# Patient Record
Sex: Male | Born: 1950
Health system: Southern US, Community
[De-identification: ages and names within clinical notes are randomized; demographics above are authoritative.]

## PROBLEM LIST (undated history)

## (undated) DIAGNOSIS — Z86010 Personal history of colonic polyps: Principal | ICD-10-CM

## (undated) DIAGNOSIS — E785 Hyperlipidemia, unspecified: Secondary | ICD-10-CM

## (undated) DIAGNOSIS — E11319 Type 2 diabetes mellitus with unspecified diabetic retinopathy without macular edema: Secondary | ICD-10-CM

## (undated) DIAGNOSIS — R569 Unspecified convulsions: Secondary | ICD-10-CM

## (undated) DIAGNOSIS — M1990 Unspecified osteoarthritis, unspecified site: Secondary | ICD-10-CM

## (undated) DIAGNOSIS — K219 Gastro-esophageal reflux disease without esophagitis: Secondary | ICD-10-CM

## (undated) DIAGNOSIS — J189 Pneumonia, unspecified organism: Secondary | ICD-10-CM

## (undated) DIAGNOSIS — F32A Depression, unspecified: Secondary | ICD-10-CM

## (undated) DIAGNOSIS — I213 ST elevation (STEMI) myocardial infarction of unspecified site: Secondary | ICD-10-CM

## (undated) DIAGNOSIS — I10 Essential (primary) hypertension: Secondary | ICD-10-CM

## (undated) DIAGNOSIS — I739 Peripheral vascular disease, unspecified: Secondary | ICD-10-CM

## (undated) DIAGNOSIS — I5022 Chronic systolic (congestive) heart failure: Secondary | ICD-10-CM

## (undated) DIAGNOSIS — H35039 Hypertensive retinopathy, unspecified eye: Secondary | ICD-10-CM

## (undated) DIAGNOSIS — N179 Acute kidney failure, unspecified: Secondary | ICD-10-CM

## (undated) DIAGNOSIS — I480 Paroxysmal atrial fibrillation: Secondary | ICD-10-CM

## (undated) DIAGNOSIS — E119 Type 2 diabetes mellitus without complications: Secondary | ICD-10-CM

## (undated) HISTORY — DX: Hyperlipidemia, unspecified: E78.5

## (undated) HISTORY — PX: CATARACT EXTRACTION: SUR2

## (undated) HISTORY — DX: Essential (primary) hypertension: I10

## (undated) HISTORY — DX: Type 2 diabetes mellitus without complications: E11.9

## (undated) HISTORY — DX: Type 2 diabetes mellitus with unspecified diabetic retinopathy without macular edema: E11.319

## (undated) HISTORY — PX: EYE SURGERY: SHX253

## (undated) HISTORY — PX: CATARACT EXTRACTION, BILATERAL: SHX1313

## (undated) HISTORY — DX: Personal history of colonic polyps: Z86.010

## (undated) HISTORY — DX: Hypertensive retinopathy, unspecified eye: H35.039

## (undated) HISTORY — PX: CERVICAL FUSION: SHX112

---

## 2005-07-15 ENCOUNTER — Inpatient Hospital Stay (HOSPITAL_COMMUNITY): Admission: EM | Admit: 2005-07-15 | Discharge: 2005-07-17 | Payer: Self-pay | Admitting: Emergency Medicine

## 2005-07-16 ENCOUNTER — Encounter (INDEPENDENT_AMBULATORY_CARE_PROVIDER_SITE_OTHER): Payer: Self-pay | Admitting: *Deleted

## 2006-09-06 ENCOUNTER — Encounter: Admission: RE | Admit: 2006-09-06 | Discharge: 2006-09-06 | Payer: Self-pay | Admitting: Orthopedic Surgery

## 2006-09-25 ENCOUNTER — Encounter: Admission: RE | Admit: 2006-09-25 | Discharge: 2006-09-25 | Payer: Self-pay | Admitting: Orthopedic Surgery

## 2006-10-09 ENCOUNTER — Encounter: Admission: RE | Admit: 2006-10-09 | Discharge: 2006-10-09 | Payer: Self-pay | Admitting: Orthopedic Surgery

## 2008-09-23 ENCOUNTER — Ambulatory Visit: Payer: Self-pay | Admitting: Family Medicine

## 2008-09-23 DIAGNOSIS — Z794 Long term (current) use of insulin: Secondary | ICD-10-CM

## 2008-09-23 DIAGNOSIS — E1152 Type 2 diabetes mellitus with diabetic peripheral angiopathy with gangrene: Secondary | ICD-10-CM | POA: Insufficient documentation

## 2008-09-23 DIAGNOSIS — F329 Major depressive disorder, single episode, unspecified: Secondary | ICD-10-CM | POA: Insufficient documentation

## 2008-11-01 ENCOUNTER — Ambulatory Visit: Payer: Self-pay | Admitting: Family Medicine

## 2008-11-01 ENCOUNTER — Encounter: Payer: Self-pay | Admitting: Family Medicine

## 2008-11-01 DIAGNOSIS — M542 Cervicalgia: Secondary | ICD-10-CM | POA: Insufficient documentation

## 2008-11-01 DIAGNOSIS — K219 Gastro-esophageal reflux disease without esophagitis: Secondary | ICD-10-CM | POA: Insufficient documentation

## 2008-11-01 DIAGNOSIS — E785 Hyperlipidemia, unspecified: Secondary | ICD-10-CM | POA: Insufficient documentation

## 2008-11-01 DIAGNOSIS — M25519 Pain in unspecified shoulder: Secondary | ICD-10-CM | POA: Insufficient documentation

## 2008-11-01 LAB — CONVERTED CEMR LAB
ALT: 22 units/L (ref 0–53)
AST: 18 units/L (ref 0–37)
Albumin: 4.1 g/dL (ref 3.5–5.2)
CO2: 22 meq/L (ref 19–32)
Chloride: 101 meq/L (ref 96–112)
Cholesterol, target level: 200 mg/dL
Creatinine, Ser: 0.83 mg/dL (ref 0.40–1.50)
HDL goal, serum: 40 mg/dL
Hgb A1c MFr Bld: 12.5 %
LDL Goal: 100 mg/dL

## 2008-11-02 ENCOUNTER — Ambulatory Visit: Payer: Self-pay | Admitting: Family Medicine

## 2008-11-02 ENCOUNTER — Encounter: Payer: Self-pay | Admitting: Family Medicine

## 2008-11-02 LAB — CONVERTED CEMR LAB
Cholesterol: 178 mg/dL (ref 0–200)
HDL: 27 mg/dL — ABNORMAL LOW (ref >39)
LDL Cholesterol: 113 mg/dL — ABNORMAL HIGH (ref 0–99)
Triglycerides: 188 mg/dL — ABNORMAL HIGH (ref <150)

## 2008-11-03 ENCOUNTER — Encounter: Payer: Self-pay | Admitting: Family Medicine

## 2008-12-21 ENCOUNTER — Ambulatory Visit: Payer: Self-pay | Admitting: Family Medicine

## 2008-12-28 ENCOUNTER — Ambulatory Visit: Payer: Self-pay | Admitting: Family Medicine

## 2008-12-28 DIAGNOSIS — F17200 Nicotine dependence, unspecified, uncomplicated: Secondary | ICD-10-CM | POA: Insufficient documentation

## 2009-02-11 ENCOUNTER — Ambulatory Visit: Payer: Self-pay | Admitting: Family Medicine

## 2009-02-11 LAB — CONVERTED CEMR LAB: Hgb A1c MFr Bld: 7 %

## 2009-03-04 ENCOUNTER — Encounter: Payer: Self-pay | Admitting: Family Medicine

## 2009-03-21 ENCOUNTER — Encounter: Payer: Self-pay | Admitting: Family Medicine

## 2010-01-03 ENCOUNTER — Encounter: Payer: Self-pay | Admitting: Family Medicine

## 2010-01-03 ENCOUNTER — Ambulatory Visit: Payer: Self-pay | Admitting: Family Medicine

## 2010-01-03 DIAGNOSIS — B351 Tinea unguium: Secondary | ICD-10-CM | POA: Insufficient documentation

## 2010-01-03 LAB — CONVERTED CEMR LAB: Hgb A1c MFr Bld: 8.3 %

## 2010-01-04 LAB — CONVERTED CEMR LAB
ALT: 18 units/L (ref 0–53)
Albumin: 4.4 g/dL (ref 3.5–5.2)
Calcium: 10 mg/dL (ref 8.4–10.5)
Creatinine, Ser: 0.83 mg/dL (ref 0.40–1.50)
Hemoglobin: 15.7 g/dL (ref 13.0–17.0)
MCHC: 34.9 g/dL (ref 30.0–36.0)
Platelets: 184 10*3/uL (ref 150–400)
Potassium: 4.2 meq/L (ref 3.5–5.3)
RBC: 5.16 M/uL (ref 4.22–5.81)
Total Bilirubin: 0.6 mg/dL (ref 0.3–1.2)
Total Protein: 6.5 g/dL (ref 6.0–8.3)

## 2010-02-14 ENCOUNTER — Telehealth: Payer: Self-pay | Admitting: *Deleted

## 2010-03-09 ENCOUNTER — Encounter: Payer: Self-pay | Admitting: Family Medicine

## 2010-03-13 ENCOUNTER — Encounter: Payer: Self-pay | Admitting: Family Medicine

## 2010-03-17 ENCOUNTER — Ambulatory Visit: Payer: Self-pay | Admitting: Family Medicine

## 2010-03-17 DIAGNOSIS — I10 Essential (primary) hypertension: Secondary | ICD-10-CM

## 2010-03-24 ENCOUNTER — Telehealth: Payer: Self-pay | Admitting: Family Medicine

## 2010-04-04 ENCOUNTER — Encounter: Payer: Self-pay | Admitting: Family Medicine

## 2010-04-04 ENCOUNTER — Ambulatory Visit: Payer: Self-pay | Admitting: Family Medicine

## 2010-04-04 LAB — CONVERTED CEMR LAB
Amphetamine Screen, Ur: NEGATIVE
Benzodiazepines.: NEGATIVE
Marijuana Metabolite: NEGATIVE
Phencyclidine (PCP): NEGATIVE

## 2010-04-21 ENCOUNTER — Encounter: Payer: Self-pay | Admitting: Family Medicine

## 2010-04-21 ENCOUNTER — Ambulatory Visit: Payer: Self-pay | Admitting: Family Medicine

## 2010-04-21 LAB — CONVERTED CEMR LAB
ALT: 59 units/L — ABNORMAL HIGH (ref 0–53)
AST: 28 units/L (ref 0–37)
Alkaline Phosphatase: 115 units/L (ref 39–117)
Bilirubin, Direct: 0.1 mg/dL (ref 0.0–0.3)
Hgb A1c MFr Bld: 11 %
Indirect Bilirubin: 0.6 mg/dL (ref 0.0–0.9)
Total Protein: 6.7 g/dL (ref 6.0–8.3)

## 2010-04-22 ENCOUNTER — Encounter: Payer: Self-pay | Admitting: Family Medicine

## 2010-08-01 NOTE — Assessment & Plan Note (Signed)
Summary: HTN, left shoulder pain, meds refilled   Vital Signs:  Patient profile:   60 year old male Height:      65 inches Weight:      180 pounds BMI:     30.06 BSA:     1.89 Temp:     97.6 degrees F Pulse rate:   64 / minute BP sitting:   163 / 83  Vitals Entered By: Jone Baseman CMA (March 17, 2010 11:16 AM) CC: HTN, shoulder pain, med refills Is Patient Diabetic? Yes Pain Assessment Patient in pain? no        Primary Care Provider:  Jamie Brookes MD  CC:  HTN, shoulder pain, and med refills.  History of Present Illness: hypertension: Pt comes in today with hypertension for the second time in a row. Discussed how pain can make his BP go up but he is in chronic pain so his BP is always up. No chest pain, no SOB.   Shoulder pain: PT is seen by an orthopedic group. He has been getting joint injections in his left shoulder. The last one did not help and he wants to know if he can get a sling to put him arm in because he always holds it in front of his body as if it's in a sling anyways. He is Rt handed.   Med refills: Discussed meds and refilled as necessary. Pt has limited income and is often not taking all prescribed pills because he can't afford them.   Habits & Providers  Alcohol-Tobacco-Diet     Alcohol drinks/day: 0     Tobacco Status: current     Tobacco Counseling: to quit use of tobacco products     Cigarette Packs/Day: <0.25     Year Started: 1969  Current Medications (verified): 1)  Lancets and Test Strips .... Test Blood Sugar Twice Daily 2)  Metformin Hcl 1000 Mg Tabs (Metformin Hcl) .... Take One Twice Daily 3)  Glimepiride 2 Mg Tabs (Glimepiride) .... One Tablet Daily Prior To Dinner 4)  Nexium 20 Mg Cpdr (Esomeprazole Magnesium) .... One Daily Each Moring 5)  Ibuprofen 200 Mg Tabs (Ibuprofen) .[name].. 1-2 Pills Every 6 Hours As Needed For Pain 6)  Simvastatin 40 Mg Tabs (Simvastatin) .... Take 1 Pill By Mouth Every Bedtime. 7)  Terbinafine Hcl  250 Mg Tabs (Terbinafine Hcl) .... Take 1 Pill Daily For 12 Weeks 8)  Bupropion Hcl 100 Mg Tabs (Bupropion Hcl) .... Start Taing 1 Tablet Morning and Evening. After 3 Days Increase To 3 Times A Day. 9)  Hydrochlorothiazide 12.5 Mg Caps (Hydrochlorothiazide) .... Take 1 Pill Every Morning.  Allergies (verified): No Known Drug Allergies  Review of Systems        vitals reviewed and pertinent negatives and positives seen in HPI   Physical Exam  General:  Well-developed,well-nourished,in no acute distress; alert,appropriate and cooperative throughout examination Lungs:  Normal respiratory effort, chest expands symmetrically. Lungs are clear to auscultation, no crackles or wheezes. Heart:  Normal rate and regular rhythm. S1 and S2 normal without gallop, murmur, click, rub or other extra sounds. Psych:  pt appears depressed with dysphoric affect   Impression & Recommendations:  Problem # 1:  ESSENTIAL HYPERTENSION (ICD-401.9) Assessment New Pt has had elevated BP at last visit but it was thought to be because of his pain. This is the second appt in a row with his BP elevated so we will start meds now. recheck BP in 1 week. see instruction.   His  updated medication list for this problem includes:    Hydrochlorothiazide 12.5 Mg Caps (Hydrochlorothiazide) .[name]... Take 1 pill every morning.  Orders: [facility]- Est Level  3 (04540)  Problem # 2:  SHOULDER PAIN, LEFT, CHRONIC (ICD-719.41) Assessment: Unchanged ADvised pt to seek help from his Orthopedic MD. I don't think a sling is best at this time since he will likely lose even more function if not using it.  His updated medication list for this problem includes:    Ibuprofen 200 Mg Tabs (Ibuprofen) .[name]... 1-2 pills every 6 hours as needed for pain  Orders: [facility]- Est Level  3 (98119)  Complete Medication List: 1)  Lancets and Test Strips  .... Test blood sugar twice daily 2)  Metformin Hcl 1000 Mg Tabs (Metformin hcl) .... Take one twice  daily 3)  Glimepiride 2 Mg Tabs (Glimepiride) .... One tablet daily prior to dinner 4)  Nexium 20 Mg Cpdr (Esomeprazole magnesium) .... One daily each moring 5)  Ibuprofen 200 Mg Tabs (Ibuprofen) .[name].. 1-2 pills every 6 hours as needed for pain 6)  Simvastatin 40 Mg Tabs (Simvastatin) .... Take 1 pill by mouth every bedtime. 7)  Terbinafine Hcl 250 Mg Tabs (Terbinafine hcl) .... Take 1 pill daily for 12 weeks 8)  Bupropion Hcl 100 Mg Tabs (Bupropion hcl) .... Start taing 1 tablet morning and evening. after 3 days increase to 3 times a day. 9)  Hydrochlorothiazide 12.5 Mg Caps (Hydrochlorothiazide) .... Take 1 pill every morning.  Patient Instructions: 1)  Come back in 1 week after starting blood pressure meds to have your BP checked.  2)  Talk to your orthopedic surgeon and find out what else can be done for the shoulder. I don't think a sling is a good idea since you may lose complete function if we do that.  Prescriptions: HYDROCHLOROTHIAZIDE 12.5 MG CAPS (HYDROCHLOROTHIAZIDE) take 1 pill every morning.  #31 x 3   Entered and Authorized by:   Jamie Brookes MD   Signed by:   Jamie Brookes MD on 03/17/2010   Method used:   Electronically to        [facility] [phone]* (retail)       [address]       [place], [place]  [address]       Ph: [phone]       Fax: [phone]   RxID:   [id number] BUPROPION HCL 100 MG TABS (BUPROPION HCL) start taing 1 tablet morning and evening. after 3 days increase to 3 times a day.  #36 x 0   Entered and Authorized by:   Jamie Brookes MD   Signed by:   Jamie Brookes MD on 03/17/2010   Method used:   Electronically to        [facility] [phone]* (retail)       [address]       [place], [place]  [address]       Ph: [phone]       Fax: [phone]   RxID:   [id number]

## 2010-08-01 NOTE — Consult Note (Signed)
Summary: [facility]   Imported By: Clydell Hakim 08/16/2009 16:50:33  _____________________________________________________________________  External Attachment:    Type:   Image     Comment:   External Document

## 2010-08-01 NOTE — Letter (Signed)
Summary: Podiatry  Podiatry   Imported By: Clydell Hakim 08/16/2009 16:08:57  _____________________________________________________________________  External Attachment:    Type:   Image     Comment:   External Document

## 2010-08-01 NOTE — Consult Note (Signed)
Summary: [facility]: Dr[facility]   Imported By: Clydell Hakim 03/22/2010 11:34:40  _____________________________________________________________________  External Attachment:    Type:   Image     Comment:   External Document

## 2010-08-01 NOTE — Assessment & Plan Note (Signed)
Summary: Toe fungus, Depression, DM2, GERD, Neck pain   Vital Signs:  Patient profile:   60 year old male Height:      65 inches Weight:      176.9 pounds BMI:     29.54 Temp:     98.4 degrees F oral Pulse rate:   80 / minute BP sitting:   153 / 76  (left arm) Cuff size:   regular  Vitals Entered By: Gladstone Pih (January 03, 2010 2:47 PM) CC: F/U Is Patient Diabetic? Yes Did you bring your meter with you today? No Pain Assessment Patient in pain? no        Primary Care Provider:  Jamie Brookes MD  CC:  F/U.  History of Present Illness: Toe fungus: Pt comes in c/o toe fungus. he says that last time he saw his podiatrist (back in march?) it was suggested that he get meds to treat his toes fungus. Pt would like to get it treated now.   Depression: Pt has been taking half a pill of his wifes Zoloft lately. He says he thinks he is a little better and his wife says that he is much better (not yelling at the grandkids as much).   DM2: Pt has not been checking his sugars. The last time he checked them was in March and it was 277. He does not have test strips or lancets. He has been letting his wife get all her meds and because they don't have much money he doesn't get his own.   GERD: Pt is still having reflux especially at night. He says that he feels a burning in his stomach and has the acid come back up at night when he is laying flat. He has to lay flat because of his neck surgeries. Discussed raising the bed.   Neck pain: Pt lives in constant neck pain but says that he has only been taking Motrin for the pain. It takes the edge off but he always has pain. Pt says the Tramadol did not help.   Habits & Providers  Alcohol-Tobacco-Diet     Tobacco Status: current     Tobacco Counseling: to quit use of tobacco products     Cigarette Packs/Day: <0.25  Current Medications (verified): 1)  Lancets and Test Strips .... Test Blood Sugar Twice Daily 2)  Metformin Hcl 1000 Mg  Tabs (Metformin Hcl) .... Take One Twice Daily 3)  Glimepiride 2 Mg Tabs (Glimepiride) .... One Tablet Daily Prior To Dinner 4)  Nexium 20 Mg Cpdr (Esomeprazole Magnesium) .... One Daily Each Moring 5)  Ibuprofen 200 Mg Tabs (Ibuprofen) .[name].. 1-2 Pills Every 6 Hours As Needed For Pain 6)  Simvastatin 40 Mg Tabs (Simvastatin) .... Take 1 Pill By Mouth Every Bedtime. 7)  Sertraline Hcl 50 Mg Tabs (Sertraline Hcl) .... Take 1 Pill Daily For Feeling Better 8)  Terbinafine Hcl 250 Mg Tabs (Terbinafine Hcl) .... Take 1 Pill Daily For 12 Weeks  Allergies (verified): No Known Drug Allergies  Social History: Packs/Day:  <0.25  Review of Systems        vitals reviewed and pertinent negatives and positives seen in HPI   Physical Exam  General:  Well-developed,well-nourished,in no acute distress; alert,appropriate and cooperative throughout examination Lungs:  Normal respiratory effort, chest expands symmetrically. Lungs are clear to auscultation, no crackles or wheezes. Heart:  Normal rate and regular rhythm. S1 and S2 normal without gallop, murmur, click, rub or other extra sounds. Msk:  left shoulder has  more ROM than at last visit.   Diabetes Management Exam:    Foot Exam (with socks and/or shoes not present):       Sensory-Monofilament:          Left foot: absent          Right foot: diminished       Inspection:          Left foot: abnormal             Comments: thick calluses on foot          Right foot: abnormal             Comments: thick calluses on foot       Nails:          Left foot: fungal infection          Right foot: fungal infection    Foot Exam by Podiatrist:       Date: 09/06/2009   Impression & Recommendations:  Problem # 1:  ONYCHOMYCOSIS, TOENAILS (ICD-110.1) Assessment New Pt has developed a very bad fungal infection. Pt wants to be treated. Labs today and repeat liver fxn labs in 6 weeks. Pt aware that he has to be tested to continue this medicine.   His  updated medication list for this problem includes:    Terbinafine Hcl 250 Mg Tabs (Terbinafine hcl) .[name]... Take 1 pill daily for 12 weeks  Orders: Comp Met-[facility] ([address]) CBC-[facility] (09811) [facility]- Est  Level 4 (91478)  Problem # 2:  DEPRESSION (ICD-311) Assessment: Deteriorated Pt has been taking his wifes meds and is feeling a little bit better on them. He has a PHQ9 score of 15. Plan to start him on the dose he has been taking at home. Will plan to folllow with serial PHQ9's.   His updated medication list for this problem includes:    Sertraline Hcl 50 Mg Tabs (Sertraline hcl) .[name]... Take 1 pill daily for feeling better  Orders: [facility]- Est  Level 4 ([address])  Problem # 3:  DIABETES MELLITUS, TYPE II (ICD-250.00) Assessment: Deteriorated A1c has gotten worse since the last time he was tested. he has not been taking his meds in about 1 year. He does not have the money for his meds or testing per patient. Pt has had referral to podeitry in the past and the last time he saw them was in March. Encouraged him to go back because he has large calluses that need to be treated.   His updated medication list for this problem includes:    Metformin Hcl 1000 Mg Tabs (Metformin hcl) .[name]... Take one twice daily    Glimepiride 2 Mg Tabs (Glimepiride) ..... One tablet daily prior to dinner  Orders: A1C-[facility] (13086) [facility]- Est  Level 4 (57846)  Problem # 4:  GERD (ICD-530.81) Assessment: Deteriorated Pt has not been taking his nexium. Refilled today. Suggested raising the head of the bed with phone books to decrease nighttime reflux.   His updated medication list for this problem includes:    Nexium 20 Mg Cpdr (Esomeprazole magnesium) ..... One daily each moring  Orders: [facility]- Est  Level 4 ([address]214)  Problem # 5:  SHOULDER PAIN, LEFT, CHRONIC (ICD-719.41) Assessment: Unchanged Pt is in constant pain but is not asking for any new pain meds. He has not been taking anything but Motrin and says this takes the  edge off. He has a h/o multiple neck surgeries and will likely have chronic pain the rest of his  life.   The following medications were removed from the medication list:    Tramadol Hcl 50 Mg Tabs (Tramadol hcl) .[name]... 1-2 pills every six hours as needed pain His updated medication list for this problem includes:    Ibuprofen 200 Mg Tabs (Ibuprofen) .[name]... 1-2 pills every 6 hours as needed for pain  Complete Medication List: 1)  Lancets and Test Strips  .... Test blood sugar twice daily 2)  Metformin Hcl 1000 Mg Tabs (Metformin hcl) .... Take one twice daily 3)  Glimepiride 2 Mg Tabs (Glimepiride) .... One tablet daily prior to dinner 4)  Nexium 20 Mg Cpdr (Esomeprazole magnesium) .... One daily each moring 5)  Ibuprofen 200 Mg Tabs (Ibuprofen) .[name].. 1-2 pills every 6 hours as needed for pain 6)  Simvastatin 40 Mg Tabs (Simvastatin) .... Take 1 pill by mouth every bedtime. 7)  Sertraline Hcl 50 Mg Tabs (Sertraline hcl) .... Take 1 pill daily for feeling better 8)  Terbinafine Hcl 250 Mg Tabs (Terbinafine hcl) .... Take 1 pill daily for 12 weeks  Patient Instructions: 1)  It was great to see you today.  2)  Try to get your meds filled as soon as possible. 3)  You are getting blood drawn today.  4)  If there is anything concerning I will call you.  5)  I will see you for retesting of lab work in 6 weeks.  Prescriptions: TERBINAFINE HCL 250 MG TABS (TERBINAFINE HCL) take 1 pill daily for 12 weeks  #120 x 0   Entered and Authorized by:   Jamie Brookes MD   Signed by:   Jamie Brookes MD on 01/03/2010   Method used:   Faxed to ...       [facility] (retail)             [place], [place]         Ph:        Fax: [phone]   RxID:   [id number] SERTRALINE HCL 50 MG TABS (SERTRALINE HCL) take 1 pill daily for feeling better  #31 x 11   Entered and Authorized by:   Jamie Brookes MD   Signed by:   Jamie Brookes MD on 01/03/2010   Method used:   Faxed to ...        [facility] (retail)             [place], [place]         Ph:        Fax: [phone]   RxID:   [id number] SIMVASTATIN 40 MG TABS (SIMVASTATIN) take 1 pill by mouth every bedtime.  #31 x 11   Entered and Authorized by:   Jamie Brookes MD   Signed by:   Jamie Brookes MD on 01/03/2010   Method used:   Faxed to ...       [facility] (retail)             [place], [place]         Ph:        Fax: [phone]   RxID:   [id number] NEXIUM 20 MG CPDR (ESOMEPRAZOLE MAGNESIUM) one daily each moring  #31 x 11   Entered and Authorized by:   Jamie Brookes MD   Signed by:   Jamie Brookes MD on 01/03/2010   Method used:   Faxed to ...       [facility] (retail)  [place], [place]         Ph:        Fax: [phone]   RxID:   [id number] GLIMEPIRIDE 2 MG TABS (GLIMEPIRIDE) one tablet daily prior to dinner  #31 x 11   Entered and Authorized by:   Jamie Brookes MD   Signed by:   Jamie Brookes MD on 01/03/2010   Method used:   Faxed to ...       [facility] (retail)             [place], [place]         Ph:        Fax: [phone]   RxID:   [id number] METFORMIN HCL 1000 MG TABS (METFORMIN HCL) Take one twice daily  #31 x 0   Entered and Authorized by:   Jamie Brookes MD   Signed by:   Jamie Brookes MD on 01/03/2010   Method used:   Faxed to ...       [facility] (retail)             [place], [place]         Ph:        Fax: [phone]   RxID:   [id number]   Laboratory Results   Blood Tests   Date/Time Received: January 03, 2010 2:52 PM  Date/Time Reported: January 03, 2010 3:31 PM   HGBA1C: 8.3%   (Normal Range: Non-Diabetic - 3-6%   Control Diabetic - 6-8%)  Comments: ...........test performed by...........Marland KitchenTerese Door, CMA

## 2010-08-01 NOTE — Assessment & Plan Note (Signed)
Summary: onychomycosis, DM2 worsened   Vital Signs:  Patient profile:   60 year old male Weight:      178.4 pounds Temp:     98.1 degrees F oral Pulse rate:   90 / minute Pulse rhythm:   regular BP sitting:   134 / 80  (left arm) Cuff size:   regular  Vitals Entered By: Loralee Pacas CMA (April 21, 2010 2:10 PM) CC: toenail fungus, DM2 Is Patient Diabetic? Yes Did you bring your meter with you today? No Comments toe fungus not any better   Primary Care Provider:  Jamie Brookes MD  CC:  toenail fungus and DM2.  History of Present Illness: Toenail FUngus: Pt has had a toenail fungus which we are treating with a long dose of terbinafine. He is starting to see come improvement in the new growth of his toes and he's happy about this We had discussed checking his liver function after he'd been on the meds for a while so he comes in today to get tested.   DM2:  Pt has not been getting all his meds refilled. He and his wife are very poor and they have to constantly make decisions about what meds to buy and which ones not to buy. He also doesn't test his CBG's often because he shares this with his wife as well and usually just lets her check her own. He is not going to an eye doctor as suggested. He is checking his feet.   Habits & Providers  Alcohol-Tobacco-Diet     Alcohol drinks/day: 0     Tobacco Status: current     Tobacco Counseling: to quit use of tobacco products     Cigarette Packs/Day: <0.25     Year Started: 1969  Current Medications (verified): 1)  Lancets and Test Strips .... Test Blood Sugar Twice Daily 2)  Metformin Hcl 1000 Mg Tabs (Metformin Hcl) .... Take One Twice Daily 3)  Glimepiride 2 Mg Tabs (Glimepiride) .... One Tablet Daily Prior To Dinner 4)  Nexium 20 Mg Cpdr (Esomeprazole Magnesium) .... One Daily Each Moring 5)  Ibuprofen 200 Mg Tabs (Ibuprofen) .[name].. 1-2 Pills Every 6 Hours As Needed For Pain 6)  Simvastatin 40 Mg Tabs (Simvastatin) .... Take 1  Pill By Mouth Every Bedtime. 7)  Terbinafine Hcl 250 Mg Tabs (Terbinafine Hcl) .... Take 1 Pill Daily For 12 Weeks 8)  Bupropion Hcl 100 Mg Tabs (Bupropion Hcl) .... Start Taing 1 Tablet Morning and Evening. After 3 Days Increase To 3 Times A Day. 9)  Hydrochlorothiazide 12.5 Mg Caps (Hydrochlorothiazide) .... Take 1 Pill Every Morning.  Allergies: No Known Drug Allergies    Impression & Recommendations:  Problem # 1:  ONYCHOMYCOSIS, TOENAILS (ICD-110.1) Assessment Improved Toenails are growing out with new healthy toenail just starting to show. Plan to get a hepatic fxn panel on him because of the terbinafine.   His updated medication list for this problem includes:    Terbinafine Hcl 250 Mg Tabs (Terbinafine hcl) .[name]... Take 1 pill daily for 12 weeks  Orders: Hepatic-[facility] ([address])  Problem # 2:  DIABETES MELLITUS, TYPE II (ICD-250.00) Pt's A1c is 11 today. This is worse than normal. He can not afford his meds so he does not buy them. THey can't afford good food so they end up eating hamberger helper most nights. I will discuss with our clinical pharmacist and see if there is anything we can do to get his meds for free.   His updated medication  list for this problem includes:    Metformin Hcl 1000 Mg Tabs (Metformin hcl) .[name]... Take one twice daily    Glimepiride 2 Mg Tabs (Glimepiride) ..... One tablet daily prior to dinner  Orders: A1C-[facility] (84132)  Complete Medication List: 1)  Lancets and Test Strips  .... Test blood sugar twice daily 2)  Metformin Hcl 1000 Mg Tabs (Metformin hcl) .... Take one twice daily 3)  Glimepiride 2 Mg Tabs (Glimepiride) .... One tablet daily prior to dinner 4)  Nexium 20 Mg Cpdr (Esomeprazole magnesium) .... One daily each moring 5)  Ibuprofen 200 Mg Tabs (Ibuprofen) .[name].. 1-2 pills every 6 hours as needed for pain 6)  Simvastatin 40 Mg Tabs (Simvastatin) .... Take 1 pill by mouth every bedtime. 7)  Terbinafine Hcl 250 Mg Tabs (Terbinafine hcl)  .... Take 1 pill daily for 12 weeks 8)  Bupropion Hcl 100 Mg Tabs (Bupropion hcl) .... Start taing 1 tablet morning and evening. after 3 days increase to 3 times a day. 9)  Hydrochlorothiazide 12.5 Mg Caps (Hydrochlorothiazide) .... Take 1 pill every morning.  Other Orders: [facility]- Est Level  3 (99213)   Orders Added: 1)  A1C-[facility] [83036] 2)  Hepatic-[facility] [[address]76-[address]60] 3)  [facility]- Est Level  3 [44010]    Prevention & Chronic Care Immunizations   Influenza vaccine: Not documented    Tetanus booster: Not documented    Pneumococcal vaccine: Not documented  Colorectal Screening   Hemoccult: Not documented    Colonoscopy: Not documented  Other Screening   PSA: Not documented   Smoking status: current  (04/21/2010)  Diabetes Mellitus   HgbA1C: 11.0  (04/21/2010)   Hemoglobin A1C due: 02/01/2009    Eye exam: Not documented   Diabetic eye exam action/deferral: Ophthalmology referral  (04/21/2010)    Foot exam: yes  (01/03/2010)   High risk foot: Not documented   Foot care education: Not documented    Urine microalbumin/creatinine ratio: Not documented    Diabetes flowsheet reviewed?: Yes   Progress toward A1C goal: Deteriorated  Lipids   Total Cholesterol: 178  (11/02/2008)   LDL: 113  (11/02/2008)   LDL Direct: Not documented   HDL: 27  (11/02/2008)   Triglycerides: 188  (11/02/2008)    SGOT (AST): 17  (01/03/2010)   SGPT (ALT): 18  (01/03/2010)   Alkaline phosphatase: 57  (01/03/2010)   Total bilirubin: 0.6  (01/03/2010)    Lipid flowsheet reviewed?: Yes   Progress toward LDL goal: Unchanged  Hypertension   Last Blood Pressure: 134 / 80  (04/21/2010)   Serum creatinine: 0.83  (01/03/2010)   Serum potassium 4.2  (01/03/2010)    Hypertension flowsheet reviewed?: Yes   Progress toward BP goal: Improved  Self-Management Support :   Personal Goals (by the next clinic visit) :     Personal A1C goal: 8  (04/21/2010)     Personal blood pressure goal: 130/80   (04/21/2010)     Personal LDL goal: 70  (04/21/2010)    Patient will work on the following items until the next clinic visit to reach self-care goals:     Medications and monitoring: take my medicines every day  (04/21/2010)     Eating: eat more vegetables, use fresh or frozen vegetables, eat foods that are low in salt  (04/21/2010)    Diabetes self-management support: Written self-care plan  (04/21/2010)   Diabetes care plan printed    Hypertension self-management support: Written self-care plan  (04/21/2010)   Hypertension self-care plan printed.  Lipid self-management support: Written self-care plan  (04/21/2010)   Lipid self-care plan printed.   Nursing Instructions: Refer for screening diabetic eye exam (see order)   Laboratory Results   Blood Tests   Date/Time Received: April 21, 2010 2:18 PM  Date/Time Reported: April 21, 2010 2:26 PM   HGBA1C: 11.0%   (Normal Range: Non-Diabetic - 3-6%   Control Diabetic - 6-8%)  Comments: ...............test performed by............Marland KitchenLoralee Pacas, CMA .............entered by...........Marland KitchenBonnie A. Swazil[place], MLS (ASCP)cm        Prevention & Chronic Care Immunizations   Influenza vaccine: Not documented    Tetanus booster: Not documented    Pneumococcal vaccine: Not documented  Colorectal Screening   Hemoccult: Not documented    Colonoscopy: Not documented  Other Screening   PSA: Not documented   Smoking status: current  (04/21/2010)  Diabetes Mellitus   HgbA1C: 11.0  (04/21/2010)   Hemoglobin A1C due: 02/01/2009    Eye exam: Not documented   Diabetic eye exam action/deferral: Ophthalmology referral  (04/21/2010)    Foot exam: yes  (01/03/2010)   High risk foot: Not documented   Foot care education: Not documented    Urine microalbumin/creatinine ratio: Not documented    Diabetes flowsheet reviewed?: Yes   Progress toward A1C goal: Deteriorated  Lipids   Total Cholesterol: 178  (11/02/2008)   LDL:  113  (11/02/2008)   LDL Direct: Not documented   HDL: 27  (11/02/2008)   Triglycerides: 188  (11/02/2008)    SGOT (AST): 17  (01/03/2010)   SGPT (ALT): 18  (01/03/2010)   Alkaline phosphatase: 57  (01/03/2010)   Total bilirubin: 0.6  (01/03/2010)    Lipid flowsheet reviewed?: Yes   Progress toward LDL goal: Unchanged  Hypertension   Last Blood Pressure: 134 / 80  (04/21/2010)   Serum creatinine: 0.83  (01/03/2010)   Serum potassium 4.2  (01/03/2010)    Hypertension flowsheet reviewed?: Yes   Progress toward BP goal: Improved  Self-Management Support :   Personal Goals (by the next clinic visit) :     Personal A1C goal: 8  (04/21/2010)     Personal blood pressure goal: 130/80  (04/21/2010)     Personal LDL goal: 70  (04/21/2010)    Patient will work on the following items until the next clinic visit to reach self-care goals:     Medications and monitoring: take my medicines every day  (04/21/2010)     Eating: eat more vegetables, use fresh or frozen vegetables, eat foods that are low in salt  (04/21/2010)    Diabetes self-management support: Written self-care plan  (04/21/2010)   Diabetes care plan printed    Hypertension self-management support: Written self-care plan  (04/21/2010)   Hypertension self-care plan printed.    Lipid self-management support: Written self-care plan  (04/21/2010)   Lipid self-care plan printed.

## 2010-08-01 NOTE — Progress Notes (Signed)
Summary: triage  Phone Note Call from Patient Call back at [phone]   Summary of Call: Pt has cold and cough and wondering what he can take for it. Initial call taken by: Clydell Hakim,  March 24, 2010 4:43 PM  Follow-up for Phone Call        spoke with wife. he has not smoked yet today. told her to encourage him to quit. suggested zyrtec or claritin, lots of fluids,.mucinex for the mucous. tyl is HA, fever or body aches. to use UC if worse over the weekend. she agreed with plan Follow-up by: Golden Circle RN,  March 24, 2010 4:46 PM  Additional Follow-up for Phone Call Additional follow up Details #1::        thanks.

## 2010-08-01 NOTE — Progress Notes (Signed)
Summary: Rx  Phone Note Call from Patient Call back at [phone]   Reason for Call: Refill Medication Summary of Call: pt needs Rxs that were written 7/5 to be sent to [facility]. Pt now has medicaid Initial call taken by: Knox Royalty,  February 14, 2010 1:40 PM  Follow-up for Phone Call       Follow-up by: Golden Circle RN,  February 14, 2010 1:46 PM    Prescriptions: TERBINAFINE HCL 250 MG TABS (TERBINAFINE HCL) take 1 pill daily for 12 weeks  #120 x 0   Entered by:   Golden Circle RN   Authorized by:   Jamie Brookes MD   Signed by:   Golden Circle RN on 02/14/2010   Method used:   Electronically to        [facility] [phone]* (retail)       [address]       [name], [place]  [address]       Ph: [phone]       Fax: [phone]   RxID:   [id number] SERTRALINE HCL 50 MG TABS (SERTRALINE HCL) take 1 pill daily for feeling better  #31 x 11   Entered by:   Golden Circle RN   Authorized by:   Jamie Brookes MD   Signed by:   Golden Circle RN on 02/14/2010   Method used:   Electronically to        [facility] [phone]* (retail)       [address]       [place], [place]  [address]       Ph: [phone]       Fax: [phone]   RxID:   [id number] SIMVASTATIN 40 MG TABS (SIMVASTATIN) take 1 pill by mouth every bedtime.  #31 x 11   Entered by:   Golden Circle RN   Authorized by:   Jamie Brookes MD   Signed by:   Golden Circle RN on 02/14/2010   Method used:   Electronically to        [facility] [phone]* (retail)       [address]       [place], [place]  [address]       Ph: [phone]       Fax: [phone]   RxID:   [id number] NEXIUM 20 MG CPDR (ESOMEPRAZOLE MAGNESIUM) one daily each moring  #31 x 11   Entered by:   Golden Circle RN   Authorized by:   Jamie Brookes MD   Signed by:   Golden Circle RN on 02/14/2010   Method used:   Electronically to        [facility] [phone]* (retail)       [address]       [place], [place]  [address]       Ph: [phone]       Fax: [phone]   RxID:   [id number] GLIMEPIRIDE 2 MG TABS (GLIMEPIRIDE) one tablet daily prior to dinner  #31 x 11   Entered by:   Golden Circle RN   Authorized by:   Jamie Brookes MD   Signed by:   Golden Circle RN on 02/14/2010   Method used:   Electronically to        [facility] [phone]* (retail)       [address]       [place], [place]  [address]       Ph: [phone]  Fax: [phone]   RxID:   [id number]  I called the pt to tell him this has been done but his voice mailbox has not been set up yet.Golden Circle RN  February 14, 2010 1:49 PM

## 2010-08-01 NOTE — Miscellaneous (Signed)
Summary: problem with med  Clinical Lists Changes   wife in office today and she states patient is having problem with anti depressant Dr. Clotilde Dieter gave him.  states he was tired , sick and dizzy while taking medication. states he only took for 2 days and couldn't get out of bed, couldn't move.  stopped taking the med and now  those symptoms have stopped. will forwrad message to MD.  Theresia Lo RN  March 09, 2010 3:46 PM   Noted. i will remove it from his med list. Jamie Brookes MD  March 10, 2010 1:47 PM

## 2010-08-01 NOTE — Letter (Signed)
Summary: Generic Letter  [facility]  [address]   [place], [place] [address]   Phone: [phone]  Fax: [phone]    04/22/2010  William [name] [address] [place], [place]  [address]  Dear [name],   Your liver tests were all normal except one that was slightly elevated. I think we should retest your liver enzymes in 6 months to see if it is still elevated. Call my office if you have any questions. You do not need to change any medications at this time.      Sincerely,   Jamie Brookes MD  Appended Document: Generic Letter MAILED.

## 2010-09-19 ENCOUNTER — Other Ambulatory Visit: Payer: Self-pay | Admitting: Family Medicine

## 2010-09-19 MED ORDER — HYDROCHLOROTHIAZIDE 12.5 MG PO CAPS: 12.5000 mg | ORAL_CAPSULE | ORAL | Status: DC

## 2010-11-17 NOTE — Discharge Summary (Signed)
NAME:  William [name], William [name] NO.:  [id number]   MEDICAL RECORD NO.:  [id number]          PATIENT TYPE:  INP   LOCATION:  5524                         FACILITY:  [facility]   PHYSICIAN:  Hillery Aldo, M.D.   DATE OF BIRTH:  Jun 23, 1951   DATE OF ADMISSION:  07/15/2005  DATE OF DISCHARGE:  07/17/2005                                 DISCHARGE SUMMARY   PRIMARY CARE PHYSICIAN:  Dr. Barbee Shropshire.   DISCHARGE DIAGNOSES:  1.  Newly diagnosed diabetes mellitus.  2.  Dyslipidemia.  3.  Syncope.  4.  Gastroesophageal reflux disease.  5.  Tobacco abuse.   DISCHARGE MEDICATIONS:  1.  Aspirin 81 mg daily.  2.  Glucotrol XL 10 mg daily.  3.  Metformin 500 mg b.i.d.  4.  Zocor 20 mg daily.  5.  Aciphex 20 mg daily.   BRIEF ADMISSION HISTORY OF PRESENT ILLNESS:  The patient is a 60 year old  male who presented to the emergency department with a syncopal event. This  was accompanied by all a feeling of coldness and clamminess as well as  weakness and lethargy. There was no associated chest pain or shortness of  breath. It apparently happened again later that day and was witnessed by  family. It was felt that he needed to be seen in the emergency department  for further evaluation. Please see the dictated HPE by Dr. Jetty Duhamel  for the full details.   PROCEDURES AND DIAGNOSTIC STUDIES:  1.  Chest x-ray: On July 15, 2005 showed mild cardiomegaly but no active      disease.  2.  Carotid Dopplers on July 16, 2005 showed no ICA stenosis with      vertebral artery flow antegrade. There was mild right focal plaque in      proximal ICA and left minimal plaque noted.  3.  EEG done on July 16, 2005 was essentially normal in the waking state      without seizure activities noted in the course of the recording.  4.  2-D echocardiogram on July 17, 2005 showed that the left ventricular      size was at the upper limits of normal. The left ventricular systolic      function was  normal. Study was inadequate for the evaluation of left      ventricular regional wall motion. The left ventricular wall thickness      was mildly increased.   DISCHARGE LABORATORY VALUES:  Hemoglobin A1c was 8.7. TSH was 0.938. BNP was  less than 30. Sodium was 136, potassium 3.7, chloride 104, bicarb 27, BUN  10, creatinine 0.8, glucose 191. CBC showed a white blood cell count 7.1,  hemoglobin 16.2, hematocrit 45.6, and platelets 186.   HOSPITAL COURSE:  Problem #1. Syncope: The patient was admitted with a  working diagnosis of syncope. He had a full workup which included serial  cardiac enzymes which were negative. His EKG revealed normal sinus rhythm  with no ST-segment changes worrisome for cardiac ischemia. He was monitored  on telemetry and had no arrhythmias. A 2-D echocardiogram did not reveal  a  source of his syncope such as valvular disease. His electrolytes were  normal. His EEG was normal. He had mild carotid artery disease but nothing  to explain his syncopal event. Given his new onset of diabetes, it was felt  that his syncope might have been secondary to hyperglycemia. He had no  further syncopal events while hospitalized.  1.  Newly diagnosed diabetes: The patient did undergo nutritional counseling      for his new onset diabetes. He was put on Glucotrol and metformin was      added on the day of discharge. He is instructed to follow up with his      primary care physician in 1-2 weeks and to bring a log of his blood      glucoses so further adjustments can be made to his regimen. The patient      states that his wife is diabetic and she has a glucometer so that he can      get this done. He will be referred for outpatient diabetes education.  2.  Tobacco abuse: The patient did undergo tobacco cessation counseling. He      was encouraged to discontinue all use of tobacco products given his      medical history..  3.  Hyperlipidemia: Fasting lipid panel was checked and  the patient did have      dyslipidemia. Specifically, his total cholesterol was 224, triglycerides      315, HDL 31 and LDL 130. He was started on 20 mg of Zocor and to follow      up with his primary care physician for repeat check of his fasting lipid      panel along with a check of his liver function studies in approximately      6 weeks.  4.  Gastroesophageal reflux disease: The patient was continued on proton      pump inhibitor therapy through the course of hospitalization with no      complaints of active reflux.   DISPOSITION:  The patient is discharged home. He is instructed to be on a  low carbohydrate,  low sugar diet. He is instructed to follow up with his  primary care physician in 1-2 weeks.   CONDITION ON DISCHARGE:  Improved           ______________________________  Hillery Aldo, M.D.     CR/MEDQ  D:  07/17/2005  T:  07/17/2005  Job:  [id number]   cc:   Olene Craven, M.D.  Fax: [phone]

## 2010-11-17 NOTE — H&P (Signed)
NAME:  William [name], William [name] NO.:  [id number]   MEDICAL RECORD NO.:  [id number]          PATIENT TYPE:  EMS   LOCATION:  [facility]                         FACILITY:  [facility]   PHYSICIAN:  Lonia Blood, M.D.DATE OF BIRTH:  16-Dec-1950   DATE OF ADMISSION:  07/15/2005  DATE OF DISCHARGE:                                HISTORY & PHYSICAL   PRIMARY CARE PHYSICIAN:  Dr. Barbee Shropshire   CHIEF COMPLAINT:  Syncope.   HISTORY OF PRESENT ILLNESS:  William [name] is a pleasant 61 year old  gentleman with a relatively simple past medical history.  He was in his  usual state of health until today.  He had been up moving around throughout  the day without any difficulty.  He sat down at the breakfast table to have  breakfast.  He then began to feel cold and clammy all over.  He began to  feel very weak.  He specifically denies any chest pain.  There was no  shortness of breath.  The patient began to feel very sleepy and had slumped  over in his chair.  Family was present and noticed the patient was out.  Patient was out for a total of approximately one to two minutes.  Then the  patient spontaneously awoke.  Upon waking he was aware of what had happened.  He felt very weak, but otherwise was without complaints.  Patient proceeded  to have his coffee and eat his breakfast.  Approximately 10 minutes later  the exact episode occurred again.  This time it lasted again approximately  one minute to two minutes at most, then it resolved.  The family was  present, however, and was alarmed at these symptoms and therefore they  prompted him to present to the emergency room for evaluation.  The patient  states that he had the first episode of this type in the late 1980s in early  20s.  He saw a family physician and was told that he had diabetes.  He was  put on medication.  A follow-up with a different physician led to him being  told he did not have diabetes and the medication being stopped.  He  did not  have any more syncopal spells, however, until approximately three to four  months ago.  Then the spells began again.  He has had one to two spells per  month for the last four months without fail.  Recently, however, they seem  to be picking up in frequency and he had two today alone.  Patient says that  he always has the warning symptoms just prior to passing out.  The patient  has never fallen or hurt himself because of the passing out.  Patient does  continue to drive.  There is no loss of bowel or bladder function.  There is  no episode of tonic-clonic type movement while the patient is out.  There is  no difficulty breathing and no regurgitation during these spells.   REVIEW OF SYSTEMS:  Patient has chronic stiff neck which he relates is due  to multiple neck surgeries  for traumatic injuries to the cervical spine.  Since his cervical spine surgeries he reports he has had multiple episodes  where his left arm just simply gives out.  He has dropped many coffee cups  and other items that he has been holding at the time.  As soon as he drops  the item he regains strength in his hand and is able to use it again  immediately.  This has occurred on the right intermittently.  This has been  occurring for multiple years since his neck surgery.  He has no other  neurologic symptoms and comprehensive review of systems is otherwise  unremarkable.   PAST MEDICAL HISTORY:  1.  Tobacco abuse in the amount of one pack per day x40+ years.  2.  Status post neck surgery x4 for multiple fractured vertebra.  Patient      reports that he has a complete cervical fusion now and apparently      surgery has included both anterior and posterior approaches.  3.  Gastroesophageal reflux disease.  4.  Prior history of alcohol abuse, discontinued 11 years ago.  5.  Questionable history of seizures.      1.  Possibly related to alcohol withdrawal, though details are not          clear.      2.  Never  been on anti-epileptic medications.      3.  No seizures since early 20s.   MEDICATIONS:  Ibuprofen p.r.n. and a stomach medicine that he cannot  remember.   ALLERGIES:  MORPHINE causes anaphylaxis.   FAMILY HISTORY:  Patient's mother died in her 57s.  She had an MI at age 50  and was also diabetic.  Patient's father died from an MI at 83, but  apparently did not have early coronary artery disease.  Patient has one  brother who has diabetes and multiple other siblings who are all healthy.   SOCIAL HISTORY:  The patient does not drink alcohol now.  He is married.  He  lives in [place].  He has two healthy children.  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   DATA REVIEWED:  Electrolytes are balanced.  Serum glucose is elevated at  216.  Alcohol level is undetectable.  pH is 7.34, pCO2 is 44.  Point of care  cardiac markers are negative x2.  A 12-leadEKG reveals normal sinus rhythm  at 62 beats per minute.  QTc is normal.  There are no acute ST or T-wave  changes to my interpretation.  Mild cardiac enlargement is appreciated but  there is no evidence of acute disease otherwise.   PHYSICAL EXAMINATION:  VITAL SIGNS:  Temperature 97.2, blood pressure  120/74, heart rate 71, respiratory rate 20, O2 saturation 97% on room air,  CBG 224.  GENERAL:  Well-developed, well-nourished male in no acute respiratory  distress who is lying in a hospital stretcher and appears comfortable.  HEENT:  Normocephalic, atraumatic.  Pupils are equal, round, and reactive to  light and accommodation.  Extraocular muscles are intact bilaterally.  OC/OP  is clear.  NECK:  There is no JVD, there is no lymphadenopathy, there is no thyromegaly  appreciated.  LUNGS:  Clear to auscultation bilaterally without wheezes or rhonchi.  CARDIOVASCULAR:  There is a regular rate and rhythm with normal S1, S2 without murmurs, rubs, or  gallops.  ABDOMEN:  Thin, nontender, nondistended, soft.  Bowel sounds present.  No  hepatosplenomegaly, rebound, no ascites.  EXTREMITIES:  No significant clubbing, cyanosis, edema bilateral lower  extremities.  NEUROLOGIC:  Cranial nerves II-XII are intact bilaterally.  The patient is  alert and oriented x4.  He is intact to sensation and touch throughout.  He  displays 5/5 strength bilateral upper and lower extremities.  There is no  orthostasis when the patient moves from the lying to the sitting position or  the sitting to standing position.  He displays coordination and intact fine  motor movement of bilateral hands.   IMPRESSION AND PLAN:  1.  Syncope.  The exact etiology of Mr. Depoy syncope is not clear.      Given the fact that these are recurrence and increasing in frequency      they are quite alarming.  Because of their recent increase in frequency      I do feel that hospitalization is required.  One concern that I have is      that these might possibly represent absence seizures.  The patient's      seizure history is not clear.  The patient is unable to provide a good      history for [place] to know if these were true alcohol withdrawal seizures or      if the patient does have a true seizure disorder.  He says that he was      never told he had epilepsy and he does admit that he was drinking      heavily at the time.  Other considerations include arrhythmias.  It is      certainly possible the patient could have a vasovagal syndrome related      to the multiple manipulations required of his neck both from the      anterior and posterior approach.  However, the patient states that he      was having spells such as this prior to his neck surgery and therefore      this explanation is not perfect.  We will admit the patient for      telemetry monitoring.  We will request an EEG.  We will request a      cardiac echocardiogram to ensure there are no occult valvular       abnormalities.  If all of these interventions are unremarkable tilt      table testing may be necessitated.  TSH and BNP will also be obtained.      I will rule the patient out for occult myocardial infarction as the      patient does have risk factors.  2.  Elevated serum glucose.  Patient does not have a diagnosis of diabetes,      though he was told when he was younger that he did have diabetes.  This      diagnosis was apparently questioned later.  Will check a hemoglobin A1c      to help clarify this issue.  I will also check a fasting blood sugar in      the morning.  CBG will be followed closely during patient's hospital      stay so that we might establish a pattern.  It is quite possible that      hypoglycemia is playing a role here.  I will check random serum cortisol      to rule out adrenal insufficiency as well.  3.  Tobacco abuse.  Patient has been advised of the multiple  deleterious     effects of ongoing tobacco abuse.  I have suggested that he discontinue      use all together.  I will provide him with a nicotine patch on a p.r.n.      basis during his hospital stay.  Tobacco cessation consultation has been      requested.  4.  Gastroesophageal reflux disease.  Patient's gastroesophageal reflux      disease appears to be reasonably well controlled at this present time.      I will continue proton-pump inhibitor during his hospital stay.      Lonia Blood, M.D.  Electronically Signed     JTM/MEDQ  D:  07/15/2005  T:  07/16/2005  Job:  [id number]   cc:   Olene Craven, M.D.  Fax: [phone]

## 2010-11-17 NOTE — Procedures (Signed)
EEG NUMBER:  [id number]   INDICATIONS:  This is a 60 year old with an episode of syncope with a  routine 17 channel EEG with one channel devoted to EKG utilizing  International 10/20 lead placement system.  The patient was described as  being awake and alert throughout the course of recording.  His  medications  included ibuprofen and over-the-counter medication for reflux, aspirin 81 mg  a day, Protonix 40 mg a day, Senna p.r.n., sliding scale NovoLog insulin,  Maalox, Ambien p.r.n., Robitussin p.r.n., Tylenol p.r.n., nicotine patch  daily.  Electrographically, the patient appears to be in the waking state  throughout this course of recording.  The background consists of low  amplitude but otherwise reasonably well organized and developed and recently  well modulated 9-0 Hz alpha activity.  There is considerable artifact which  is worsened by the necessity for using lower sensitivities in order to bring  up the amplitudes.  No clear interhemispheric asymmetry is identified and no  definite epileptiform discharges were seen.  Both hyperventilation and  photic stimulation. activation procedures were performed and did not produce  any significant change in the background activity.  The EKG monitor reveals  relatively regular rhythm with a rate of 60 beats per minute.   CONCLUSION:  Essentially normal EEG in the waking state without seizure  activities noted in the course of the today's recording.  The amplitude is  somewhat low overall and there is considerable artifact but no definite  pathologic abnormalities identified.  Clinical correlation is recommended.      Catherine A. Orlin Hilding, M.D.  Electronically Signed     ZOX:WRUE  D:  07/16/2005 14:15:01  T:  07/16/2005 22:15:29  Job #:  [id number]

## 2011-03-12 ENCOUNTER — Ambulatory Visit (INDEPENDENT_AMBULATORY_CARE_PROVIDER_SITE_OTHER): Payer: Medicare Other | Admitting: Sports Medicine

## 2011-03-12 VITALS — BP 128/70 | HR 77 | Temp 97.9°F | Wt 171.0 lb

## 2011-03-12 DIAGNOSIS — E119 Type 2 diabetes mellitus without complications: Secondary | ICD-10-CM

## 2011-03-12 DIAGNOSIS — F329 Major depressive disorder, single episode, unspecified: Secondary | ICD-10-CM

## 2011-03-12 DIAGNOSIS — F39 Unspecified mood [affective] disorder: Secondary | ICD-10-CM

## 2011-03-12 DIAGNOSIS — K219 Gastro-esophageal reflux disease without esophagitis: Secondary | ICD-10-CM

## 2011-03-12 DIAGNOSIS — B351 Tinea unguium: Secondary | ICD-10-CM

## 2011-03-12 MED ORDER — GLIMEPIRIDE 2 MG PO TABS: 2.0000 mg | ORAL_TABLET | Freq: Every day | ORAL | Status: DC

## 2011-03-12 MED ORDER — SIMVASTATIN 40 MG PO TABS: 40.0000 mg | ORAL_TABLET | Freq: Every day | ORAL | Status: DC

## 2011-03-12 MED ORDER — METFORMIN HCL ER (OSM) 1000 MG PO TB24: 1000.0000 mg | ORAL_TABLET | Freq: Two times a day (BID) | ORAL | Status: DC

## 2011-03-12 MED ORDER — DIVALPROEX SODIUM ER 250 MG PO TB24: 250.0000 mg | ORAL_TABLET | Freq: Every day | ORAL | Status: DC

## 2011-03-12 MED ORDER — ESOMEPRAZOLE MAGNESIUM 20 MG PO CPDR: 20.0000 mg | DELAYED_RELEASE_CAPSULE | Freq: Every day | ORAL | Status: DC

## 2011-03-12 NOTE — Assessment & Plan Note (Addendum)
Suspect this is a bipolar disorder; Will start depakote trial to see if we can help with some mood stablization Need to perform MDQ/CIDI at next visit.

## 2011-03-12 NOTE — Patient Instructions (Signed)
It was nice meeting you.  I am refilling your meds and they will be at [facility].  Call us if you have any questions.  We are starting you on Depakote to help with your mood.  Please come back in to see [place] in 1 month for follow up of your diabetes.

## 2011-03-14 ENCOUNTER — Encounter: Payer: Self-pay | Admitting: Sports Medicine

## 2011-03-14 NOTE — Assessment & Plan Note (Signed)
Not willing to take the terbinafine at this time. Will try SuperGlue q week X 6 months

## 2011-03-14 NOTE — Assessment & Plan Note (Signed)
?   Etiology of mood disorder; suspect this is a bi-polar disorder

## 2011-03-14 NOTE — Progress Notes (Signed)
Pt presents today with his wife, daughter and grand child.  Scheduled for a diabetes check today but family reports wanting to discuss his irritable mood.  He reports not being able to obtain his medications from [facility] secondary to being illiterate and not being able to disclose what medications he was on to the pharmacy.  He has not been taking any medications except OTC omeprazole.    DIABETES: medication compliance: noncompliant much of the time, diabetic diet compliance: probably noncompliant though I cannot elicit that specific history, home glucose monitoring: is not performed related to not picking up his rx, further diabetic ROS: no chest pain, dyspnea or TIA's, no numbness, tingling or pain in extremities.  IRRITABILITY:  Daughter reports that he has been much more aggressive in his behaviors and is more short tempered than normal. This has been going on for many years but has worsened over the summer months.  He has been aggressive to some children who were visiting his grandchildren in their house has not been overtly abusive.  He states this is not normal for him.  Denies depressive symptoms at this time but has been treated previously for depression and found to be a nonrespondent to anti-depressant therapy.  He does report some through racing.    PE: GENERAL:  Adult  Caucasian male, examined in [facility].  Alert and appropriately interactive.  In no discomfort; no respiratory distress. PSYCH: Mild psychomotor agitation; ambivalence towards health care in general; no inappropriate behaviors but aggressive demeanor    HNEENT: PERRLA, extra ocular movement intact and sclera clear, anicteric THORAX: HEART: S1, S2 normal, no murmur, rub or gallop, regular rate and rhythm LUNGS: clear to auscultation, no wheezes or rales and unlabored breathing ABDOMEN:  abdomen is soft without significant tenderness, masses, organomegaly or guarding EXTREMITIES: loss of sensation to sharp stimuli on B LE; L  worse than R.  No edema.

## 2011-03-14 NOTE — Assessment & Plan Note (Signed)
Poorly controlled not taking any of his meds; will refill and have instructed the pt to call if there are any issues with the pharmacy.

## 2011-05-31 ENCOUNTER — Ambulatory Visit: Payer: Medicare Other | Admitting: Sports Medicine

## 2011-06-20 ENCOUNTER — Ambulatory Visit: Payer: Medicare Other | Admitting: Sports Medicine

## 2011-07-10 ENCOUNTER — Encounter: Payer: Self-pay | Admitting: Sports Medicine

## 2011-07-10 ENCOUNTER — Ambulatory Visit (INDEPENDENT_AMBULATORY_CARE_PROVIDER_SITE_OTHER): Payer: Medicare Other | Admitting: Sports Medicine

## 2011-07-10 VITALS — BP 131/78 | HR 109 | Temp 98.1°F | Ht 65.0 in | Wt 171.8 lb

## 2011-07-10 DIAGNOSIS — E119 Type 2 diabetes mellitus without complications: Secondary | ICD-10-CM

## 2011-07-10 DIAGNOSIS — I10 Essential (primary) hypertension: Secondary | ICD-10-CM | POA: Diagnosis not present

## 2011-07-10 DIAGNOSIS — F39 Unspecified mood [affective] disorder: Secondary | ICD-10-CM

## 2011-07-10 DIAGNOSIS — K219 Gastro-esophageal reflux disease without esophagitis: Secondary | ICD-10-CM

## 2011-07-10 MED ORDER — DIVALPROEX SODIUM ER 500 MG PO TB24: 500.0000 mg | ORAL_TABLET | Freq: Every day | ORAL | Status: DC

## 2011-07-10 MED ORDER — SIMVASTATIN 40 MG PO TABS: 40.0000 mg | ORAL_TABLET | Freq: Every day | ORAL | Status: DC

## 2011-07-10 MED ORDER — GLIMEPIRIDE 2 MG PO TABS: 2.0000 mg | ORAL_TABLET | Freq: Every day | ORAL | Status: DC

## 2011-07-10 MED ORDER — METFORMIN HCL ER (OSM) 1000 MG PO TB24: 1000.0000 mg | ORAL_TABLET | Freq: Two times a day (BID) | ORAL | Status: DC

## 2011-07-10 NOTE — Assessment & Plan Note (Signed)
Decently controlled at this time 131/78. Patient benefit greatly from smoking cessation however unwilling to this time.  . I will refill his head for thiazide today

## 2011-07-10 NOTE — Progress Notes (Signed)
Patient ID: William [name], male   DOB: September 24, 1950, 61 y.o.   MRN: [id number] Subjective:  William [name] is a 61 y.o. male presenting today for followup of his chronic medical conditions. He was started on Depakote at last visit and took this for one month and said that he give him much relief and he did very well with it however stopped until he is able to see me again.  He's otherwise been noncompliant with most of his medications and has been unable to fill his metformin and simvastatin as well as as Amaryl on a regular basis. Do to financial concerns as well as compliance issues.  Does need assistance with medication management as he has had multiple issues with obtaining medications from the pharmacy do to his illiteracy. He is unable to read write and when the pharmacist ask him what medications he takes he is unable to tell them and states it pharmacist will not fill his meds  He does report being interested in meeting with William [name] for medication compliance and lifestyle intervention with his wife.    ROS: Constitutional  reports no major changes however his less active than he had been in the past, reports no falls,   Infectious  no acute illness, no fever no chills   Resp  no cough, no congestion continues to smoke   Cardiac  negative   GI  no reported changes in bowel   GU  no reported changes in bladder habits   Skin  noted skin lesions   Activity  reports being unable to participate in any activities. He used to be able to get up and go do what he wanted to have her at this time is unable to perform as many activities as he hasn't passed. 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   ROS as per HPI and above otherwise 12 point ROS negative.   PE: GENERAL:  Very greater than actually Caucasian male examined in [facility]. In no acute discomfort, no respiratory distress. Alert and properly interactive throughout  exam HNEENT: Atraumatic, normocephalic, moist nasal membranes, no scleral icterus, trachea midline THORAX: HEART: Regular rate and rhythm S1-S2 heard, no murmur LUNGS: Clear to auscultation, prolonged expiratory phase EXTREMITIES: Uninhibited range of motion and hips and knees, no peripheral edema, 2+ out of 4 DP and PT pulses and bilateral lower extremities, bilateral onychomycosis, sensation intact to light touch on bilateral lower extremities. No foot lesions >PSYCH: Does report being more depressed than normal. PH q. 9 performed with patient as he is illiterate and he reported 9 positive symptoms however denied second question. He does not really have any thought racing he is very reserved in social situations however does have issues with anger as well as irregular sleep patterns and thought racing concentration and he reports that when he was in his younger days [employer] that he could do anything.  He also reports a profound depressive history. Has had a single suicide attempt in the 1970s. He denies any current thoughts of hurting himself or hurting others. He has no plans.  He does feel like when he was on the Depakote that he was doing better with his mood it was much easier to control his tempers however was still flaring and would like a higher dose.

## 2011-07-10 NOTE — Assessment & Plan Note (Signed)
Still having intermittent reported explosive anger issues. Lucency through butter dish across the room. Also demonstrating signs of severe depression. We'll start him back on the Depakote at 500 mg dose however because he tolerated the 250 mg dose very well.

## 2011-07-10 NOTE — Assessment & Plan Note (Addendum)
Very poor control do to very poor compliance with his medications. He does have issues obtaining them and also has issues with literacy. Will need health behavior intervention. At this time no medication changes are appropriate but has been noncompliant and were not sure as to how his current regimen if it here to will be effective. Would likely benefit from insulin regimen however is not adhering to his current regimen.  We'll need to readdress this with behavioral change intervention.

## 2011-07-10 NOTE — Patient Instructions (Signed)
Provided verbal instructions and left building before AVS finished.

## 2011-07-11 LAB — LIPID PANEL: Cholesterol: 212 mg/dL — ABNORMAL HIGH (ref 0–200)

## 2011-07-11 LAB — BASIC METABOLIC PANEL
BUN: 16 mg/dL (ref 6–23)
Calcium: 9.6 mg/dL (ref 8.4–10.5)
Creat: 0.88 mg/dL (ref 0.50–1.35)
Glucose, Bld: 543 mg/dL — ABNORMAL HIGH (ref 70–99)

## 2011-07-24 ENCOUNTER — Ambulatory Visit: Payer: Medicare Other | Admitting: Home Health Services

## 2011-08-10 ENCOUNTER — Ambulatory Visit (INDEPENDENT_AMBULATORY_CARE_PROVIDER_SITE_OTHER): Payer: Medicare Other | Admitting: Sports Medicine

## 2011-08-10 ENCOUNTER — Encounter: Payer: Self-pay | Admitting: Sports Medicine

## 2011-08-10 VITALS — BP 135/81 | HR 92 | Temp 97.8°F | Ht 65.0 in | Wt 172.2 lb

## 2011-08-10 DIAGNOSIS — K219 Gastro-esophageal reflux disease without esophagitis: Secondary | ICD-10-CM

## 2011-08-10 DIAGNOSIS — E119 Type 2 diabetes mellitus without complications: Secondary | ICD-10-CM

## 2011-08-10 DIAGNOSIS — F17200 Nicotine dependence, unspecified, uncomplicated: Secondary | ICD-10-CM | POA: Diagnosis not present

## 2011-08-10 DIAGNOSIS — I10 Essential (primary) hypertension: Secondary | ICD-10-CM | POA: Diagnosis not present

## 2011-08-10 NOTE — Progress Notes (Signed)
  Subjective:    Patient ID: William [name], male    DOB: 29-Oct-1950, 61 y.o.   MRN: [id number]  HPI The patient was counseled on the dangers of tobacco use, and was advised to quit.  Reviewed strategies to maximize success, including removing cigarettes and smoking materials from environment, stress management and substitution of other forms of reinforcement.    Patient Identified Concern:  Helping family, health Stage of Change Patient Is In:  Contemplation- pt plans on making changes within next 6 months.  Patient Reported Barriers:  Habits, priorities, finances Patient Reported Perceived Benefits:  Better health Patient Reports Self-Efficacy:   Pt displays low self-efficacy Behavior Change Supports:  Wife also has a need to make changes Goals:  Pt will work on taking medications daily and prioritize to have $ available for co-pays when he runs out.  Pt will also work on improving eating my eating more vegetables.  Patient Education:  We talked about health being a priority, ways to increase health including medication compliance, dietary changes, and smoking cessation.      Review of Systems     Objective:   Physical Exam        Assessment & Plan:

## 2011-08-10 NOTE — Assessment & Plan Note (Signed)
Reports good compliance with his current regimen.  Unwilling to add new med or insulin at this time.  Goals of health care addressed and unsure of his health priorities at this time

## 2011-08-10 NOTE — Assessment & Plan Note (Signed)
Assessment:  Pt reports smoking 1/2 a day.  Been smoking for many years.  Plan: Continue counseling with health coach.  Work on reducing to 1/4 pack a day or 5 cigarettes a day.

## 2011-08-10 NOTE — Assessment & Plan Note (Signed)
Stable - no changes at this time as pt unwilling to make med regimen changes

## 2011-08-10 NOTE — Patient Instructions (Signed)
It was nice to see you today.  I am glad we were able to meet today and discuss some of your health goals.  I would like for you to think more about what we discussed and plan on staying in contact with our office.  I would like to discuss adding an additional medication to your regimen for your diabetes as your HbA1c indicates that we have some significant room to improve your diabetic control.    Please come up with what you expect our of our office and any particular thing that we can do to help you meet your health goals.  Please plan to return to see me in 2-4 weeks.  If you need anything prior to seeing me please call the clinic.

## 2011-08-10 NOTE — Assessment & Plan Note (Signed)
Not able to fill PPI per insurance issues

## 2011-08-10 NOTE — Progress Notes (Signed)
Subjective:  William [name] is a 61 y.o. male presenting today for follow up of his ongoing medical needs and to address medical compliance issues.  Pt reports unwillingness to addition of new medication for his diabetes and reports good compliance IF he is able to obtain his medications.  Barriers to obtaining include transportation and copay issues.  He denies chest pain, dyspnea, or fevers/chills.  No reported changes in bowel or bladder.  Continues to smoke 0.5ppd and unwilling to consider quiting although has for 6 years in the past.   Reports daily foot check without any lesions  disscussed overall health care goals and pt reports he just wants to take 1 day at a time.  Unsure of what he expects of the health care field and some ?s regarding priority of his health.  Meet with William [name] today with his wife.   ROS See HPI  Past Medical Hx Reviewed: yes Medications Reviewed: yes Family History Reviewed: yes   PE: GENERAL:  Appearance >stated age caucasian male.  Examined in [facility].  In no acute distress, no resp distress HNEENT: AT/[place], MMM, no scleral icterus, EOMi THORAX: HEART: RRR, S1/S2 heard, no murmur LUNGS: CTA B, no wheezes, no crackles ABDOMEN:  +BS, soft, non-tender, no rigidity, no guarding, no masses/organomegaly EXTREMITIES: Moves all 4 extremities spontaneously, warm well perfused, no edema, bilateral DP and PT pulses 1+/4.

## 2011-09-06 ENCOUNTER — Telehealth: Payer: Self-pay | Admitting: Home Health Services

## 2011-09-06 MED ORDER — OMEPRAZOLE 40 MG PO CPDR: 40.0000 mg | DELAYED_RELEASE_CAPSULE | Freq: Every day | ORAL | Status: DC

## 2011-09-06 NOTE — Telephone Encounter (Signed)
Have sent in a new Rx for Omeprazole.  Medicare usually covers this.  If not please have him let us know and we can try another PPI that is covered.

## 2011-09-06 NOTE — Telephone Encounter (Signed)
Addended by: Gaspar Bidding D on: 09/06/2011 03:37 PM   Modules accepted: Orders

## 2011-09-06 NOTE — Telephone Encounter (Signed)
Spoke with William [name].  Pt reports taking medications daily without missing any days.  Pt also reports that Medicaid won't pay for his "stomach" medicine and if there is something else he can get?  Pt reports eating more vegetables and really enjoys the way his wife makes them.   Pt reports smoking 1/2 pack a day.  Not interested in quitting right now.  Pt couldn't set any specific goals for this next week but is going to continue to take medications daily and increase daily vegetables.  Pt's overall goal is med compliance, dietary changes, smoking cessation.

## 2011-09-10 NOTE — Telephone Encounter (Signed)
Left message for William [name] about new prescription.

## 2011-09-11 ENCOUNTER — Ambulatory Visit: Payer: Medicare Other | Admitting: Sports Medicine

## 2011-10-16 ENCOUNTER — Telehealth: Payer: Self-pay | Admitting: Home Health Services

## 2011-10-16 NOTE — Telephone Encounter (Signed)
Spoke with William [name].  Pt reports doing okay.  He reports having access to medications and is taking them.  Discussed with patient his eligibility to be case managed by [facility].  Pt expressed interest and I informed him that Pam would be calling in to establish relationship.  Pt would like to wait to schedule an office visit with PCP until his wife renews her [employer].    Pt is still looking for another home, since his rental property is in foreclosure.

## 2011-10-17 ENCOUNTER — Telehealth: Payer: Self-pay | Admitting: Home Health Services

## 2011-10-17 NOTE — Telephone Encounter (Signed)
Spoke with William [name] this morning. We will arrange form transportation, as needed, to appointment to PCP. He  and daughter Marcelino Duster stated they are not always able to afford gasoline.   Also, I have asked Danford Bad to assist to see if she has any information on how William [name] can get her birth certificate so she can apply for disability and Medicare.       Thanks for the referral   Rexene Agent, M.Ed. Community Care Coordinator [facility]

## 2011-12-19 ENCOUNTER — Ambulatory Visit (INDEPENDENT_AMBULATORY_CARE_PROVIDER_SITE_OTHER): Payer: Medicare Other | Admitting: Sports Medicine

## 2011-12-19 VITALS — BP 144/80 | HR 76 | Temp 98.3°F | Wt 174.6 lb

## 2011-12-19 DIAGNOSIS — E785 Hyperlipidemia, unspecified: Secondary | ICD-10-CM | POA: Diagnosis not present

## 2011-12-19 DIAGNOSIS — I10 Essential (primary) hypertension: Secondary | ICD-10-CM | POA: Diagnosis not present

## 2011-12-19 DIAGNOSIS — K219 Gastro-esophageal reflux disease without esophagitis: Secondary | ICD-10-CM

## 2011-12-19 DIAGNOSIS — Z7189 Other specified counseling: Secondary | ICD-10-CM | POA: Diagnosis not present

## 2011-12-19 DIAGNOSIS — F39 Unspecified mood [affective] disorder: Secondary | ICD-10-CM

## 2011-12-19 DIAGNOSIS — E119 Type 2 diabetes mellitus without complications: Secondary | ICD-10-CM

## 2011-12-19 LAB — POCT GLYCOSYLATED HEMOGLOBIN (HGB A1C): Hemoglobin A1C: 11.7

## 2011-12-19 MED ORDER — HYDROCHLOROTHIAZIDE 25 MG PO TABS: 25.0000 mg | ORAL_TABLET | Freq: Every day | ORAL | Status: DC

## 2012-01-01 NOTE — Progress Notes (Signed)
  [facility]  Patient name: William [name] MRN [id number]  Date of birth: 10-13-1950  CC & HoPI  William [name] is a 60 y.o. male presenting today for follow-up of Diabetes, HTN, HLD, Bipolar disorder, tobacco dependence and GERD.  Problems:   Hypertension - chronic problem, marginally controlled.   Pt reports no chest pain, no dyspnea on exertion, no orthopnea, no peripheral edema, no TIAs,   Diabetes - chronic problem, poorly controlled.  Home glucose monitoring is not performed.  Patient reports no polyuria or polydipsia, no chest pain, dyspnea or TIA's, no numbness, tingling or pain in extremities, no medication side effects noted  Basename 12/19/11 1332 07/10/11 1553 03/12/11 1015  HGBA1C 11.7 13.6 13.9     Hyperlipidemia - chronic problem, poorly controlled  Basename 07/10/11 1556  CHOL 212*  TRIG 497*  HDL 29*    Medication Compliance: reports compliance on most days however unwilling to add insulin or make other esclations in therapy  Diet Compliance: noncompliant much of the time New Concerns: None  He had been experiencing GERD and has a prescription for omeprazole.  Pt reports difficulty with obtaining from his pharmacy but is unwilling to stay on hold long enough to discuss the problem with his pharmacy.  His reflux symptoms are poorly controlled, needs to quit smoking, needs to follow diet more regularly and due to not picking up his medicine.     ROS  Per HPI  Pertinent History Reviewed  Medical & Surgical Hx:  Reviewed: Significant for multiple cardiovascular risk factors, including hypertension, hyperlipidemia, uncontrolled diabetes.  He does have evidence of end organ damage, including peripheral neuropathy and erectile dysfunction.   Complicating his medical care are compliance issues, as well as a mood disorder, including bipolar disorder. Medications: Reviewed & Updated - see associated section Social History: Reviewed - Significant for  continues to smoke half pack per day.  He and his wife in person moved into home which has created a more stable home environment.  Objective Findings  Vitals:  Filed Vitals:   12/19/11 1341  BP: 144/80  Pulse: 76  Temp: 98.3 F (36.8 C)   PE: GENERAL:  Adult, obese, Caucasian, slightly disheveled male.  Examined in [facility].  In no discomfort; norespiratory distress.   H&N: AT/[place], MMM, no scleral icterus, EOMi THORAX: HEART: RRR, S1/S2 heard, no murmur LUNGS: CTA B, no wheezes, no crackles EXTREMITIES: Moves all 4 extremities spontaneously, warm well perfused, no edema, bilateral DP and PT pulses 1/4.   Diabetic foot exam: Sensation intact to monofilament testing throughout.  No evidence of diabetic lesions.

## 2012-01-05 ENCOUNTER — Encounter: Payer: Self-pay | Admitting: Sports Medicine

## 2012-01-05 MED ORDER — DIVALPROEX SODIUM ER 500 MG PO TB24: 500.0000 mg | ORAL_TABLET | Freq: Two times a day (BID) | ORAL | Status: DC

## 2012-01-05 MED ORDER — METFORMIN HCL ER (OSM) 1000 MG PO TB24: 1000.0000 mg | ORAL_TABLET | Freq: Two times a day (BID) | ORAL | Status: DC

## 2012-01-05 MED ORDER — GLIPIZIDE ER 5 MG PO TB24: 5.0000 mg | ORAL_TABLET | Freq: Every day | ORAL | Status: DC

## 2012-01-05 NOTE — Assessment & Plan Note (Signed)
Patient has continued to have issues picking up as prescription.  Reports he is unwilling to wait on hold on the telephone to discuss medication.  Issues with his pharmacy.  Will once again try to refill this PPI for his symptoms.  Discussed dietary options, but I have little hope that significant changes will be made

## 2012-01-05 NOTE — Assessment & Plan Note (Signed)
Will increase his HCTZ to in the morning.  He is unwilling to have any other medications at this time.  Although he needs to be on an ACE inhibitor he does not want to make these changes.  We'll continue to discuss this and discussed the option of a combination pill at his next visit.

## 2012-01-05 NOTE — Assessment & Plan Note (Signed)
We'll optimize free medications.  We'll send in his diabetic medications to her sterile changes, glyburide 2 and continued to encourage him to consider insulin therapy.

## 2012-01-05 NOTE — Assessment & Plan Note (Signed)
Will need to recheck

## 2012-01-05 NOTE — Assessment & Plan Note (Signed)
Reports doing much better now.  He is on Depakote.  He is willing to go to twice a day.

## 2012-01-05 NOTE — Assessment & Plan Note (Signed)
Extremely poorly controlled.  Patient is completely unwilling at this time to undergo insulin therapy.  Has had poor compliance with his diabetic diet and medication regimens.  Discussed to be a high likelihood of significant morbidity and mortality associated with uncontrolled diabetes, hypertension, and hyperlipidemia.  Patient is aware of these and continues to be unwilling to make changes

## 2012-01-07 ENCOUNTER — Telehealth: Payer: Self-pay | Admitting: Sports Medicine

## 2012-01-07 NOTE — Telephone Encounter (Signed)
Forwarded to pcp. Pt does have rx for omeprazole but has not had this filled thru [facility].Loralee Pacas [place]

## 2012-01-07 NOTE — Telephone Encounter (Signed)
Is asking about the stomach meds that he needs from the health dept. - Dr Berline Chough said he would try to help him find out what meds they have that he can afford.

## 2012-01-17 MED ORDER — FAMOTIDINE 20 MG PO TABS: 20.0000 mg | ORAL_TABLET | Freq: Two times a day (BID) | ORAL | Status: DC | PRN

## 2012-01-21 ENCOUNTER — Encounter: Payer: Self-pay | Admitting: Sports Medicine

## 2012-01-21 ENCOUNTER — Ambulatory Visit (INDEPENDENT_AMBULATORY_CARE_PROVIDER_SITE_OTHER): Payer: Medicare Other | Admitting: Sports Medicine

## 2012-01-21 VITALS — BP 124/75 | HR 82 | Temp 97.2°F | Ht 65.0 in | Wt 172.8 lb

## 2012-01-21 DIAGNOSIS — K219 Gastro-esophageal reflux disease without esophagitis: Secondary | ICD-10-CM

## 2012-01-21 DIAGNOSIS — E119 Type 2 diabetes mellitus without complications: Secondary | ICD-10-CM

## 2012-01-21 DIAGNOSIS — F17200 Nicotine dependence, unspecified, uncomplicated: Secondary | ICD-10-CM | POA: Diagnosis not present

## 2012-01-21 DIAGNOSIS — I10 Essential (primary) hypertension: Secondary | ICD-10-CM | POA: Diagnosis not present

## 2012-01-21 DIAGNOSIS — F39 Unspecified mood [affective] disorder: Secondary | ICD-10-CM

## 2012-01-21 DIAGNOSIS — Z7189 Other specified counseling: Secondary | ICD-10-CM | POA: Diagnosis not present

## 2012-01-21 MED ORDER — DIVALPROEX SODIUM ER 500 MG PO TB24
500.0000 mg | ORAL_TABLET | Freq: Two times a day (BID) | ORAL | Status: DC
Start: 2012-01-21 — End: 2012-09-15

## 2012-01-21 MED ORDER — SIMVASTATIN 40 MG PO TABS: 40.0000 mg | ORAL_TABLET | Freq: Every day | ORAL | Status: DC

## 2012-01-21 MED ORDER — HYDROCHLOROTHIAZIDE 25 MG PO TABS: 25.0000 mg | ORAL_TABLET | Freq: Every day | ORAL | Status: AC

## 2012-01-21 MED ORDER — GLIPIZIDE ER 5 MG PO TB24: 5.0000 mg | ORAL_TABLET | Freq: Every day | ORAL | Status: DC

## 2012-01-21 MED ORDER — METFORMIN HCL ER (OSM) 1000 MG PO TB24: 1000.0000 mg | ORAL_TABLET | Freq: Two times a day (BID) | ORAL | Status: DC

## 2012-01-21 MED ORDER — OMEPRAZOLE 40 MG PO CPDR: 40.0000 mg | DELAYED_RELEASE_CAPSULE | Freq: Every day | ORAL | Status: DC

## 2012-01-21 NOTE — Patient Instructions (Signed)
It was nice to see you today.  I have refilled all of your medications and to send him them [facility].  I have refilled.  All of your medications, including your stomach medications and they should be available through [facility].  If you have issues please let your pharmacist know so they can get in touch with [place].  Please plan to return to see me in 1 month.  If you need anything prior to seeing me please call the clinic.

## 2012-01-21 NOTE — Telephone Encounter (Signed)
Addressed in clinic visit.

## 2012-01-29 ENCOUNTER — Encounter: Payer: Self-pay | Admitting: Sports Medicine

## 2012-01-29 NOTE — Assessment & Plan Note (Signed)
Continued to discuss high likelihood of significant M&M with his poorly controlled medical problems.  The patient is aware of the risks and continues to refuse injectable therapy at this time. And adjusted his glipizide and his metformin to provide him with free prescriptions through [facility] as he and his wife also report not be able to afford 4 dollar co-pays.

## 2012-01-29 NOTE — Progress Notes (Signed)
  [facility]  Patient name: William [name] MRN [id number]  Date of birth: 1950/11/14  CC & HoPI  Oren Barella is a 61 y.o. male presenting today for follow-up of Diabetes, HTN, HLD, Bipolar disorder, tobacco dependence and GERD.  Problems:   Hypertension - chronic problem, marginally controlled.   Pt reports no chest pain, no dyspnea on exertion, no orthopnea, no peripheral edema, no TIAs,   Diabetes - chronic problem, poorly controlled.  Home glucose monitoring is not performed.  Patient reports no polyuria or polydipsia, no chest pain, dyspnea or TIA's, no numbness, tingling or pain in extremities, no medication side effects noted  Basename 12/19/11 1332 07/10/11 1553 03/12/11 1015  HGBA1C 11.7 13.6 13.9     Hyperlipidemia - chronic problem, poorly controlled  Basename 07/10/11 1556  CHOL 212*  TRIG 497*  HDL 29*    Medication Compliance: reports compliance on most days however unwilling to add insulin or make other esclations in therapy  Diet Compliance: noncompliant much of the time New Concerns: None  He continues to have difficulty GERD and has a prescription for omeprazole that he has been unable to fill.  He continues to see the he is turned to care for himself but does not want to escalate any of his medications at this time.    ROS  Per HPI  Pertinent History Reviewed  Medical & Surgical Hx:  Reviewed: Significant for multiple cardiovascular risk factors, including hypertension, hyperlipidemia, uncontrolled diabetes.  He does have evidence of end organ damage, including peripheral neuropathy and erectile dysfunction.   Complicating his medical care are compliance issues, as well as a mood disorder, including bipolar disorder. Medications: Reviewed & Updated - see associated section Social History: Reviewed - Significant for continues to smoke half pack per day.    Objective Findings  Vitals:  Filed Vitals:   01/21/12 1329  BP: 124/75  Pulse:  82  Temp: 97.2 F (36.2 C)   PE: GENERAL:  Adult, obese, Caucasian, slightly disheveled male.  Examined in [facility].  In no discomfort; norespiratory distress.   Psych: Poor insight, thought content appropriate incongruent.  Affect flattened H&N: AT/[place], MMM, no scleral icterus, EOMi THORAX: HEART: RRR, S1/S2 heard, no murmur LUNGS: CTA B, no wheezes, no crackles EXTREMITIES: Moves all 4 extremities spontaneously, warm well perfused, no edema, bilateral DP and PT pulses 1/4.  No evidence of new diabetic wound

## 2012-02-12 NOTE — Assessment & Plan Note (Addendum)
Will try to get Prilosec covered.  Instructions to let us know if this is not obtained but has failed to follow through previously

## 2012-02-12 NOTE — Assessment & Plan Note (Signed)
Great greater than 50% of this 25 minute visit deal time has been spent in counseling the patient regarding concerns for has continued ongoing medical issues with an additional extensive amount of time  coordinating care for this patient to help with obtaining medications

## 2012-02-12 NOTE — Assessment & Plan Note (Signed)
Maintained.  No changes at this time

## 2012-02-22 ENCOUNTER — Ambulatory Visit (INDEPENDENT_AMBULATORY_CARE_PROVIDER_SITE_OTHER): Payer: Medicare Other | Admitting: Sports Medicine

## 2012-02-22 ENCOUNTER — Encounter: Payer: Self-pay | Admitting: Sports Medicine

## 2012-02-22 VITALS — BP 134/72 | HR 84 | Temp 98.1°F | Wt 170.0 lb

## 2012-02-22 DIAGNOSIS — E119 Type 2 diabetes mellitus without complications: Secondary | ICD-10-CM | POA: Diagnosis not present

## 2012-02-22 DIAGNOSIS — F17200 Nicotine dependence, unspecified, uncomplicated: Secondary | ICD-10-CM | POA: Diagnosis not present

## 2012-02-22 DIAGNOSIS — F39 Unspecified mood [affective] disorder: Secondary | ICD-10-CM

## 2012-02-22 DIAGNOSIS — I10 Essential (primary) hypertension: Secondary | ICD-10-CM | POA: Diagnosis not present

## 2012-02-22 NOTE — Assessment & Plan Note (Addendum)
No changes since last visit.  Reports some lifestyle changes/dietary changes.  Pt still refuses injectables.  Reports having all medicines today and reports good compliance A1c in 1 month

## 2012-02-22 NOTE — Patient Instructions (Addendum)
It was good to see you.  Keep up with your lifestyle changes.  All of your Refills are at [facility]  STOP SMOKING.  I need to see you back in 1 month.

## 2012-02-22 NOTE — Assessment & Plan Note (Signed)
Stable/Well Controlled - no changes at this time. 

## 2012-02-22 NOTE — Assessment & Plan Note (Signed)
Reports doing well.  Denies worsening symptoms or anger issues. Stable/Well Controlled - no changes at this time.

## 2012-02-22 NOTE — Progress Notes (Signed)
  [facility]  Patient name: William [name] MRN [id number]  Date of birth: 1950/10/04  CC & HoPI  Norm Wray is a 61 y.o. male presenting today for follow-up of Diabetes, HTN, HLD, Bipolar disorder, tobacco dependence and GERD.  Problems:   Hypertension - chronic problem, marginally controlled.   Pt reports no chest pain, no dyspnea on exertion, no orthopnea, no peripheral edema, no TIAs,   Diabetes - chronic problem, poorly controlled.  Home glucose monitoring is not performed.  Patient reports no polyuria or polydipsia, no chest pain, dyspnea or TIA's, no numbness, tingling or pain in extremities, no medication side effects noted  Last A1c - 2 months ago    Hyperlipidemia - chronic problem, poorly controlled  Medication Compliance: reports compliance on most days however unwilling to add insulin or make other esclations in therapy  Diet Compliance: reports improvement in diet.  Eating salads and no fried foods New Concerns: None  Reports anger issues improved on depakote  He continues to have difficulty with GERD.  Has filled Prilosec since last visit.  ROS  Per HPI  Pertinent History Reviewed  Medical & Surgical Hx:  Reviewed: Significant for multiple cardiovascular risk factors, including hypertension, hyperlipidemia, uncontrolled diabetes.  He does have evidence of end organ damage, including peripheral neuropathy and erectile dysfunction.   Complicating his medical care are compliance issues, as well as a mood disorder, including bipolar disorder. Medications: Reviewed & Updated - see associated section Social History: Reviewed - Significant for continues to smoke half pack per day.    Objective Findings  Vitals:  Filed Vitals:   02/22/12 1401  BP: 134/72  Pulse: 84  Temp: 98.1 F (36.7 C)   PE: GENERAL:  Adult, obese, Caucasian, slightly disheveled male.  Examined in [facility].  In no discomfort; norespiratory distress.   Psych: Poor insight,  thought content appropriate incongruent.  Affect flattened H&N: AT/[place], MMM, no scleral icterus, EOMi THORAX: HEART: RRR, S1/S2 heard, no murmur LUNGS: CTA B, no wheezes, no crackles EXTREMITIES: Moves all 4 extremities spontaneously, warm well perfused, no edema, bilateral DP and PT pulses 1/4.  No evidence of new diabetic wound

## 2012-02-22 NOTE — Assessment & Plan Note (Signed)
Stable.  Pt continues to smoke.  Does not plan to quit

## 2012-03-26 ENCOUNTER — Encounter: Payer: Self-pay | Admitting: Sports Medicine

## 2012-03-26 ENCOUNTER — Ambulatory Visit (INDEPENDENT_AMBULATORY_CARE_PROVIDER_SITE_OTHER): Payer: Medicare Other | Admitting: Sports Medicine

## 2012-03-26 VITALS — BP 130/76 | HR 108 | Temp 98.1°F | Ht 65.0 in | Wt 166.0 lb

## 2012-03-26 DIAGNOSIS — E1169 Type 2 diabetes mellitus with other specified complication: Secondary | ICD-10-CM

## 2012-03-26 DIAGNOSIS — I10 Essential (primary) hypertension: Secondary | ICD-10-CM | POA: Diagnosis not present

## 2012-03-26 DIAGNOSIS — E785 Hyperlipidemia, unspecified: Secondary | ICD-10-CM | POA: Diagnosis not present

## 2012-03-26 DIAGNOSIS — E119 Type 2 diabetes mellitus without complications: Secondary | ICD-10-CM

## 2012-03-26 DIAGNOSIS — N529 Male erectile dysfunction, unspecified: Secondary | ICD-10-CM | POA: Diagnosis not present

## 2012-04-03 NOTE — Progress Notes (Signed)
  [facility]  Patient name: Verbon Alsbrooks MRN [id number]  Date of birth: [birth year]  CC & HPI:  Sukhman Gaddis is a 61 y.o. male presenting today for follow up of:  #  Hypertension - chronic problem, well controlled.   Pt reports no chest pain, no dyspnea on exertion, no orthopnea, no peripheral edema, no TIAs,  #  Diabetes - chronic problem, poorly controlled.  Home glucose monitoring is not performed.  Patient reports no polyuria or polydipsia, no chest pain, dyspnea or TIA's, no numbness, tingling or pain in extremities, has noted excessive thirstiness and frequent urination, has dysesthesias in the feet,  +ED, difficulty obtaining &  maintaining erections  #  Hyperlipidemia - chronic problem, marginally controlled    ------------------------------------------------------------------------------------------------------------------ Medication Compliance: Has not picked up most of his diabetic meds due to cost.  Was told they were not free at [facility] so didn't pick them up and transferred back to [facility]  Diet Compliance: noncompliant much of the time  ------------------------------------------------------------------------------------------------------------------ New Concerns:  none   ROS:  PER HPI  Pertinent History Reviewed:  Medical & Surgical Hx:  Reviewed: Significant for DM, HTN, HLD Medications: Reviewed & Updated - see associated section Social History: Reviewed - Significant for current everday smoker  Objective Findings:  Vitals:  Filed Vitals:   03/26/12 0955  BP: 130/76  Pulse: 108  Temp: 98.1 F (36.7 C)    PE: GENERAL:  Adult apparent age>than stated male. In no discomfort; no respiratory distress. PSYCH: Alert and appropriately interactive; Insight:Shallow   H&N: AT/[place], trachea midline EENT:  MMM, no scleral icterus, EOMi HEART: RRR, S1/S2 heard, no murmur LUNGS: CTA B, no wheezes, no crackles EXTREMITIES: Moves all 4 extremities  spontaneously, warm well perfused, no edema, bilateral DP and PT pulses 2/4.      Assessment & Plan:

## 2012-04-07 DIAGNOSIS — E1169 Type 2 diabetes mellitus with other specified complication: Secondary | ICD-10-CM | POA: Insufficient documentation

## 2012-04-07 NOTE — Assessment & Plan Note (Signed)
Needs full lipid panel will try to coordinate with pt but may obtain direct LDL at next appointment

## 2012-04-07 NOTE — Assessment & Plan Note (Signed)
Stable/Well Controlled - no changes at this time. 

## 2012-04-07 NOTE — Assessment & Plan Note (Signed)
Has tried medications in the past.  Minimal effect but would be interested in trying if could obtain for free; no options at this time

## 2012-04-07 NOTE — Assessment & Plan Note (Signed)
A1c in 1 month Encouraged to continue lifestyle modification Has not picked up meds - emphasized improtance and suggested trying [facility] again; pulled up [employer] 4$ list and showed him his medications are "FREE" there

## 2012-04-21 ENCOUNTER — Ambulatory Visit: Payer: Medicare Other | Admitting: Sports Medicine

## 2012-05-12 ENCOUNTER — Encounter: Payer: Self-pay | Admitting: Home Health Services

## 2012-05-14 ENCOUNTER — Encounter: Payer: Self-pay | Admitting: Home Health Services

## 2012-09-15 ENCOUNTER — Other Ambulatory Visit: Payer: Self-pay | Admitting: Sports Medicine

## 2012-09-22 ENCOUNTER — Encounter: Payer: Self-pay | Admitting: *Deleted

## 2012-10-20 ENCOUNTER — Encounter: Payer: Self-pay | Admitting: *Deleted

## 2012-11-12 ENCOUNTER — Encounter: Payer: Self-pay | Admitting: Sports Medicine

## 2012-11-12 ENCOUNTER — Telehealth: Payer: Self-pay | Admitting: Sports Medicine

## 2012-11-12 NOTE — Telephone Encounter (Signed)
Needs a letter stating that patient is a diabetic - they have had their water cut off today and needs this letter asap so it can be turned back on.

## 2012-11-12 NOTE — Telephone Encounter (Signed)
Spoke with patient and informed him that letter is up front for pick up

## 2012-11-12 NOTE — Telephone Encounter (Signed)
LVM for patient to call back to see exactly what letter needs to say for paitient

## 2012-11-12 NOTE — Telephone Encounter (Signed)
Letter needing to state that he's water can't be shut off because he's diabetic.William [name], Virgel Bouquet

## 2013-01-08 ENCOUNTER — Other Ambulatory Visit: Payer: Self-pay

## 2013-01-22 ENCOUNTER — Telehealth: Payer: Self-pay | Admitting: *Deleted

## 2013-01-22 NOTE — Telephone Encounter (Signed)
Pt called  regarding scheduling of yearly diabetic check and A1C - number out of service , letter also sent to pt home. Wyatt Haste, RN-BSN

## 2013-06-15 ENCOUNTER — Encounter: Payer: Self-pay | Admitting: Home Health Services

## 2013-07-06 ENCOUNTER — Ambulatory Visit (INDEPENDENT_AMBULATORY_CARE_PROVIDER_SITE_OTHER): Payer: Medicare Other | Admitting: Sports Medicine

## 2013-07-06 ENCOUNTER — Encounter: Payer: Self-pay | Admitting: Sports Medicine

## 2013-07-06 VITALS — BP 121/64 | HR 74 | Temp 98.0°F | Wt 150.2 lb

## 2013-07-06 DIAGNOSIS — Z7189 Other specified counseling: Secondary | ICD-10-CM

## 2013-07-06 DIAGNOSIS — IMO0002 Reserved for concepts with insufficient information to code with codable children: Secondary | ICD-10-CM

## 2013-07-06 DIAGNOSIS — I10 Essential (primary) hypertension: Secondary | ICD-10-CM | POA: Diagnosis not present

## 2013-07-06 DIAGNOSIS — E785 Hyperlipidemia, unspecified: Secondary | ICD-10-CM | POA: Diagnosis not present

## 2013-07-06 DIAGNOSIS — E119 Type 2 diabetes mellitus without complications: Secondary | ICD-10-CM | POA: Diagnosis not present

## 2013-07-06 DIAGNOSIS — IMO0001 Reserved for inherently not codable concepts without codable children: Secondary | ICD-10-CM | POA: Diagnosis not present

## 2013-07-06 DIAGNOSIS — E1165 Type 2 diabetes mellitus with hyperglycemia: Secondary | ICD-10-CM

## 2013-07-06 LAB — CBC
HEMATOCRIT: 48.7 % (ref 39.0–52.0)
HEMOGLOBIN: 17.4 g/dL — AB (ref 13.0–17.0)
MCH: 30.8 pg (ref 26.0–34.0)
MCHC: 35.7 g/dL (ref 30.0–36.0)
MCV: 86.2 fL (ref 78.0–100.0)
Platelets: 249 10*3/uL (ref 150–400)
RBC: 5.65 MIL/uL (ref 4.22–5.81)
RDW: 13.4 % (ref 11.5–15.5)
WBC: 7.9 10*3/uL (ref 4.0–10.5)

## 2013-07-06 LAB — POCT GLYCOSYLATED HEMOGLOBIN (HGB A1C): Hemoglobin A1C: >14

## 2013-07-06 LAB — COMPREHENSIVE METABOLIC PANEL
ALBUMIN: 4.4 g/dL (ref 3.5–5.2)
ALK PHOS: 99 U/L (ref 39–117)
ALT: 11 U/L (ref 0–53)
AST: 11 U/L (ref 0–37)
BILIRUBIN TOTAL: 0.9 mg/dL (ref 0.3–1.2)
BUN: 12 mg/dL (ref 6–23)
CALCIUM: 9.4 mg/dL (ref 8.4–10.5)
CO2: 29 meq/L (ref 19–32)
CREATININE: 0.68 mg/dL (ref 0.50–1.35)
Chloride: 100 mEq/L (ref 96–112)
Glucose, Bld: 306 mg/dL — ABNORMAL HIGH (ref 70–99)
Potassium: 4.7 mEq/L (ref 3.5–5.3)
SODIUM: 135 meq/L (ref 135–145)
TOTAL PROTEIN: 6.3 g/dL (ref 6.0–8.3)

## 2013-07-06 LAB — LIPID PANEL
Cholesterol: 224 mg/dL — ABNORMAL HIGH (ref 0–200)
HDL: 34 mg/dL — AB (ref >39)
LDL Cholesterol: 127 mg/dL — ABNORMAL HIGH (ref 0–99)
TRIGLYCERIDES: 316 mg/dL — AB (ref <150)
Total CHOL/HDL Ratio: 6.6 Ratio
VLDL: 63 mg/dL — ABNORMAL HIGH (ref 0–40)

## 2013-07-06 MED ORDER — GLIPIZIDE 10 MG PO TABS: 10.0000 mg | ORAL_TABLET | Freq: Two times a day (BID) | ORAL | Status: DC

## 2013-07-06 MED ORDER — LISINOPRIL 10 MG PO TABS: 10.0000 mg | ORAL_TABLET | Freq: Every day | ORAL | Status: DC

## 2013-07-06 MED ORDER — METFORMIN HCL 1000 MG PO TABS: 1000.0000 mg | ORAL_TABLET | Freq: Two times a day (BID) | ORAL | Status: DC

## 2013-07-06 MED ORDER — ROSUVASTATIN CALCIUM 40 MG PO TABS: 40.0000 mg | ORAL_TABLET | Freq: Every day | ORAL | Status: DC

## 2013-07-06 NOTE — Progress Notes (Signed)
  William [name] - 63 y.o. male MRN [id number]  Date of birth: 21-Oct-1950  CC, HPI, Interval History & ROS  William [name] is here today to followup on his chronic medical conditions including:  Diabetes, hyperlipidemia, hypertension, mood disorder.  He reports being off all his current medications.  Not and she said in insulin therapy.  family is concerned because he and his wife both had significant weight loss  Not following diabetic diet.  Significant polyuria, polydipsia polyphagia, weight loss  Pt denies chest pain, dyspnea at rest or exertion, PND, lower extremity edema.  Patient denies any facial asymmetry, unilateral weakness, or dysarthria.  Other acute problems include:  none   Pertinent History & Care Coordination  No Patient Care Coordination Note on file.  History  Smoking status  . Current Every Day Smoker -- 0.50 packs/day  . Types: Cigarettes  Smokeless tobacco  . Not on file   Health Maintenance Due  Topic  . Pneumococcal Polysaccharide Vaccine (##1)  . Foot Exam   . Ophthalmology Exam   . Urine Microalbumin   . Tetanus/tdap   . Colonoscopy   . Zostavax   . Influenza Vaccine     Recent Labs  07/06/13 1008 07/06/13 1051  HGBA1C >14.0  --   TRIG  --  316*  CHOL  --  224*  HDL  --  34*  LDLCALC  --  127*     Otherwise past Medical, Surgical, Social, and Family History Reviewed per EMR Medications and Allergies reviewed and all updated if necessary. Objective Findings  VITALS: HR: 74 bpm  BP: 121/64 mmHg  TEMP: 98 F (36.7 C) (Oral)  RESP:    HT:    WT: 150 lb 3.2 oz (68.13 kg)  BMI:     BP Readings from Last 3 Encounters:  07/06/13 121/64  03/26/12 130/76  02/22/12 134/72   Wt Readings from Last 3 Encounters:  07/06/13 150 lb 3.2 oz (68.13 kg)  03/26/12 166 lb (75.297 kg)  02/22/12 170 lb (77.111 kg)     PHYSICAL EXAM: GENERAL:  adult Caucasian  male. In no discomfort; no respiratory distress  PSYCH: alert and appropriate, good insight     HNEENT:  no JVD   CARDIO: RRR, S1/S2 heard, no murmur  LUNGS: CTA B, no wheezes, no crackles  ABDOMEN:   EXTREM:  Warm, well perfused.  Moves all 4 extremities spontaneously; no lateralization.  No noted foot lesions; sensation lacking to monofilament testing in a stocking distribution from the approximate mid foot bilaterally.   distal pulses absent capillary refill 3 seconds.  No pretibial edema.  GU:   SKIN:      Assessment & Plan   Problems addressed today: General Plan & Pt Instructions:  1. DM (diabetes mellitus), type 2, uncontrolled   2. DIABETES MELLITUS, TYPE II   3. Type II or unspecified type diabetes mellitus without mention of complication, not stated as uncontrolled   4. ESSENTIAL HYPERTENSION   5. Counseling and coordination of care   6. HYPERLIPIDEMIA       Start meds back      For further discussion of A/P and for follow up issues see problem based charting.

## 2013-07-06 NOTE — Patient Instructions (Signed)
   Start meds back     If you need anything prior to your next visit please call the clinic. Please Bring all medications or accurate medication list with you to each appointment; an accurate medication list is essential in providing you the best care possible.

## 2013-07-09 ENCOUNTER — Encounter: Payer: Self-pay | Admitting: Sports Medicine

## 2013-07-14 NOTE — Assessment & Plan Note (Signed)
Blood pressure improved likely due to weight loss and diuretic effect of hyperglycemia.  Agreeable to addition of ACE inhibitor due to diabetes.

## 2013-07-14 NOTE — Assessment & Plan Note (Signed)
Has been off all medications and significant weight loss. Patient only agreeable to restart medications in spite of long talk regarding the risks of hyperglycemia

## 2013-07-14 NOTE — Assessment & Plan Note (Signed)
Statin therapy restarted

## 2013-07-14 NOTE — Assessment & Plan Note (Signed)
Extensive time spent in counseling cortication of care for this patient regarding his chronic ongoing medical needs.  He is not amenable to any escalation in his therapy for more started today.  He understands the significantly elevated risks of heart attack, stroke, renal failure as well as other complications including peripheral neuropathy and likelihood of amputation.

## 2013-09-09 DIAGNOSIS — I10 Essential (primary) hypertension: Secondary | ICD-10-CM | POA: Diagnosis not present

## 2013-09-09 DIAGNOSIS — M26609 Unspecified temporomandibular joint disorder, unspecified side: Secondary | ICD-10-CM | POA: Diagnosis not present

## 2013-09-09 DIAGNOSIS — E119 Type 2 diabetes mellitus without complications: Secondary | ICD-10-CM | POA: Diagnosis not present

## 2013-09-09 DIAGNOSIS — K219 Gastro-esophageal reflux disease without esophagitis: Secondary | ICD-10-CM | POA: Diagnosis not present

## 2013-12-14 ENCOUNTER — Other Ambulatory Visit (HOSPITAL_COMMUNITY): Payer: Self-pay | Admitting: Internal Medicine

## 2013-12-14 DIAGNOSIS — E119 Type 2 diabetes mellitus without complications: Secondary | ICD-10-CM | POA: Diagnosis not present

## 2013-12-14 DIAGNOSIS — I739 Peripheral vascular disease, unspecified: Secondary | ICD-10-CM

## 2013-12-14 DIAGNOSIS — M1610 Unilateral primary osteoarthritis, unspecified hip: Secondary | ICD-10-CM | POA: Diagnosis not present

## 2013-12-14 DIAGNOSIS — I10 Essential (primary) hypertension: Secondary | ICD-10-CM | POA: Diagnosis not present

## 2013-12-14 DIAGNOSIS — F17200 Nicotine dependence, unspecified, uncomplicated: Secondary | ICD-10-CM | POA: Diagnosis not present

## 2013-12-15 ENCOUNTER — Ambulatory Visit (HOSPITAL_COMMUNITY): Payer: Medicare Other | Attending: Internal Medicine

## 2014-02-08 DIAGNOSIS — K219 Gastro-esophageal reflux disease without esophagitis: Secondary | ICD-10-CM | POA: Diagnosis not present

## 2014-02-08 DIAGNOSIS — E1139 Type 2 diabetes mellitus with other diabetic ophthalmic complication: Secondary | ICD-10-CM | POA: Diagnosis not present

## 2014-02-08 DIAGNOSIS — H269 Unspecified cataract: Secondary | ICD-10-CM | POA: Diagnosis not present

## 2014-02-08 DIAGNOSIS — I10 Essential (primary) hypertension: Secondary | ICD-10-CM | POA: Diagnosis not present

## 2014-02-09 ENCOUNTER — Ambulatory Visit (HOSPITAL_COMMUNITY)
Admission: RE | Admit: 2014-02-09 | Discharge: 2014-02-09 | Disposition: A | Discharge status: Home / Self Care | Payer: Medicare Other | Source: Ambulatory Visit | Attending: Internal Medicine | Admitting: Internal Medicine

## 2014-02-09 DIAGNOSIS — I739 Peripheral vascular disease, unspecified: Secondary | ICD-10-CM | POA: Insufficient documentation

## 2014-02-09 DIAGNOSIS — I10 Essential (primary) hypertension: Secondary | ICD-10-CM | POA: Insufficient documentation

## 2014-02-09 DIAGNOSIS — M79609 Pain in unspecified limb: Secondary | ICD-10-CM | POA: Diagnosis not present

## 2014-02-09 DIAGNOSIS — F17200 Nicotine dependence, unspecified, uncomplicated: Secondary | ICD-10-CM

## 2014-02-09 NOTE — Progress Notes (Addendum)
VASCULAR LAB PRELIMINARY  ARTERIAL  ABI completed:    RIGHT    LEFT    PRESSURE WAVEFORM  PRESSURE WAVEFORM  BRACHIAL 135 triphasic BRACHIAL 141 triphasic  DP 102 monophasic DP 120 monophasic  AT   AT    PT 134 monophasic PT 101 monophasic  PER   PER    GREAT TOE  NA GREAT TOE  NA    RIGHT LEFT  ABI 0.95 0.85   Duplex imaging reveals 20-49% stenosis throughout bilaterally.  Kess Mcilwain, RVT 02/09/2014, 12:02 PM

## 2014-02-22 DIAGNOSIS — IMO0002 Reserved for concepts with insufficient information to code with codable children: Secondary | ICD-10-CM | POA: Diagnosis not present

## 2014-02-22 DIAGNOSIS — IMO0001 Reserved for inherently not codable concepts without codable children: Secondary | ICD-10-CM | POA: Diagnosis not present

## 2014-03-22 DIAGNOSIS — I10 Essential (primary) hypertension: Secondary | ICD-10-CM | POA: Diagnosis not present

## 2014-03-22 DIAGNOSIS — F17200 Nicotine dependence, unspecified, uncomplicated: Secondary | ICD-10-CM | POA: Diagnosis not present

## 2014-03-22 DIAGNOSIS — E1139 Type 2 diabetes mellitus with other diabetic ophthalmic complication: Secondary | ICD-10-CM | POA: Diagnosis not present

## 2014-03-22 DIAGNOSIS — K219 Gastro-esophageal reflux disease without esophagitis: Secondary | ICD-10-CM | POA: Diagnosis not present

## 2014-04-07 DIAGNOSIS — H25041 Posterior subcapsular polar age-related cataract, right eye: Secondary | ICD-10-CM | POA: Diagnosis not present

## 2014-04-07 DIAGNOSIS — H268 Other specified cataract: Secondary | ICD-10-CM | POA: Diagnosis not present

## 2014-04-07 DIAGNOSIS — E119 Type 2 diabetes mellitus without complications: Secondary | ICD-10-CM | POA: Diagnosis not present

## 2014-04-07 DIAGNOSIS — H25011 Cortical age-related cataract, right eye: Secondary | ICD-10-CM | POA: Diagnosis not present

## 2014-04-15 DIAGNOSIS — H25012 Cortical age-related cataract, left eye: Secondary | ICD-10-CM | POA: Diagnosis not present

## 2014-05-20 DIAGNOSIS — H25012 Cortical age-related cataract, left eye: Secondary | ICD-10-CM | POA: Diagnosis not present

## 2014-05-20 DIAGNOSIS — H25042 Posterior subcapsular polar age-related cataract, left eye: Secondary | ICD-10-CM | POA: Diagnosis not present

## 2014-05-20 DIAGNOSIS — H259 Unspecified age-related cataract: Secondary | ICD-10-CM | POA: Diagnosis not present

## 2014-05-31 DIAGNOSIS — E1136 Type 2 diabetes mellitus with diabetic cataract: Secondary | ICD-10-CM | POA: Diagnosis not present

## 2014-05-31 DIAGNOSIS — E11319 Type 2 diabetes mellitus with unspecified diabetic retinopathy without macular edema: Secondary | ICD-10-CM | POA: Diagnosis not present

## 2014-05-31 DIAGNOSIS — Z23 Encounter for immunization: Secondary | ICD-10-CM | POA: Diagnosis not present

## 2014-05-31 DIAGNOSIS — I10 Essential (primary) hypertension: Secondary | ICD-10-CM | POA: Diagnosis not present

## 2014-08-30 DIAGNOSIS — K219 Gastro-esophageal reflux disease without esophagitis: Secondary | ICD-10-CM | POA: Diagnosis not present

## 2014-08-30 DIAGNOSIS — I10 Essential (primary) hypertension: Secondary | ICD-10-CM | POA: Diagnosis not present

## 2014-08-30 DIAGNOSIS — F17210 Nicotine dependence, cigarettes, uncomplicated: Secondary | ICD-10-CM | POA: Diagnosis not present

## 2014-08-30 DIAGNOSIS — Z125 Encounter for screening for malignant neoplasm of prostate: Secondary | ICD-10-CM | POA: Diagnosis not present

## 2014-08-30 DIAGNOSIS — E1136 Type 2 diabetes mellitus with diabetic cataract: Secondary | ICD-10-CM | POA: Diagnosis not present

## 2014-10-11 DIAGNOSIS — G2589 Other specified extrapyramidal and movement disorders: Secondary | ICD-10-CM | POA: Diagnosis not present

## 2014-10-11 DIAGNOSIS — I10 Essential (primary) hypertension: Secondary | ICD-10-CM | POA: Diagnosis not present

## 2014-10-11 DIAGNOSIS — K219 Gastro-esophageal reflux disease without esophagitis: Secondary | ICD-10-CM | POA: Diagnosis not present

## 2014-10-11 DIAGNOSIS — E1136 Type 2 diabetes mellitus with diabetic cataract: Secondary | ICD-10-CM | POA: Diagnosis not present

## 2015-06-22 DIAGNOSIS — F17210 Nicotine dependence, cigarettes, uncomplicated: Secondary | ICD-10-CM | POA: Diagnosis not present

## 2015-06-22 DIAGNOSIS — E1136 Type 2 diabetes mellitus with diabetic cataract: Secondary | ICD-10-CM | POA: Diagnosis not present

## 2015-06-22 DIAGNOSIS — Z23 Encounter for immunization: Secondary | ICD-10-CM | POA: Diagnosis not present

## 2015-06-22 DIAGNOSIS — K219 Gastro-esophageal reflux disease without esophagitis: Secondary | ICD-10-CM | POA: Diagnosis not present

## 2015-06-22 DIAGNOSIS — K625 Hemorrhage of anus and rectum: Secondary | ICD-10-CM | POA: Diagnosis not present

## 2015-06-22 DIAGNOSIS — G2589 Other specified extrapyramidal and movement disorders: Secondary | ICD-10-CM | POA: Diagnosis not present

## 2015-06-22 DIAGNOSIS — I10 Essential (primary) hypertension: Secondary | ICD-10-CM | POA: Diagnosis not present

## 2015-07-03 DIAGNOSIS — I213 ST elevation (STEMI) myocardial infarction of unspecified site: Secondary | ICD-10-CM

## 2015-07-03 HISTORY — DX: ST elevation (STEMI) myocardial infarction of unspecified site: I21.3

## 2015-07-27 DIAGNOSIS — F17210 Nicotine dependence, cigarettes, uncomplicated: Secondary | ICD-10-CM | POA: Diagnosis not present

## 2015-07-27 DIAGNOSIS — E1136 Type 2 diabetes mellitus with diabetic cataract: Secondary | ICD-10-CM | POA: Diagnosis not present

## 2015-07-27 DIAGNOSIS — K219 Gastro-esophageal reflux disease without esophagitis: Secondary | ICD-10-CM | POA: Diagnosis not present

## 2015-07-27 DIAGNOSIS — I10 Essential (primary) hypertension: Secondary | ICD-10-CM | POA: Diagnosis not present

## 2015-07-27 DIAGNOSIS — Z719 Counseling, unspecified: Secondary | ICD-10-CM | POA: Diagnosis not present

## 2015-08-10 DIAGNOSIS — H25041 Posterior subcapsular polar age-related cataract, right eye: Secondary | ICD-10-CM | POA: Diagnosis not present

## 2015-08-10 DIAGNOSIS — E119 Type 2 diabetes mellitus without complications: Secondary | ICD-10-CM | POA: Diagnosis not present

## 2015-08-10 DIAGNOSIS — Z961 Presence of intraocular lens: Secondary | ICD-10-CM | POA: Diagnosis not present

## 2015-09-09 DIAGNOSIS — E1136 Type 2 diabetes mellitus with diabetic cataract: Secondary | ICD-10-CM | POA: Diagnosis not present

## 2015-09-09 DIAGNOSIS — F17210 Nicotine dependence, cigarettes, uncomplicated: Secondary | ICD-10-CM | POA: Diagnosis not present

## 2015-09-09 DIAGNOSIS — K219 Gastro-esophageal reflux disease without esophagitis: Secondary | ICD-10-CM | POA: Diagnosis not present

## 2015-09-09 DIAGNOSIS — I10 Essential (primary) hypertension: Secondary | ICD-10-CM | POA: Diagnosis not present

## 2015-09-12 DIAGNOSIS — E119 Type 2 diabetes mellitus without complications: Secondary | ICD-10-CM | POA: Diagnosis not present

## 2015-09-12 DIAGNOSIS — H25041 Posterior subcapsular polar age-related cataract, right eye: Secondary | ICD-10-CM | POA: Diagnosis not present

## 2015-09-12 DIAGNOSIS — H25011 Cortical age-related cataract, right eye: Secondary | ICD-10-CM | POA: Diagnosis not present

## 2015-09-12 DIAGNOSIS — H2511 Age-related nuclear cataract, right eye: Secondary | ICD-10-CM | POA: Diagnosis not present

## 2015-09-12 DIAGNOSIS — H268 Other specified cataract: Secondary | ICD-10-CM | POA: Diagnosis not present

## 2015-10-27 DIAGNOSIS — H2511 Age-related nuclear cataract, right eye: Secondary | ICD-10-CM | POA: Diagnosis not present

## 2015-10-27 DIAGNOSIS — H25811 Combined forms of age-related cataract, right eye: Secondary | ICD-10-CM | POA: Diagnosis not present

## 2016-01-27 DIAGNOSIS — F17210 Nicotine dependence, cigarettes, uncomplicated: Secondary | ICD-10-CM | POA: Diagnosis not present

## 2016-01-27 DIAGNOSIS — Z23 Encounter for immunization: Secondary | ICD-10-CM | POA: Diagnosis not present

## 2016-01-27 DIAGNOSIS — E1136 Type 2 diabetes mellitus with diabetic cataract: Secondary | ICD-10-CM | POA: Diagnosis not present

## 2016-01-27 DIAGNOSIS — L84 Corns and callosities: Secondary | ICD-10-CM | POA: Diagnosis not present

## 2016-01-27 DIAGNOSIS — I10 Essential (primary) hypertension: Secondary | ICD-10-CM | POA: Diagnosis not present

## 2016-01-27 DIAGNOSIS — K219 Gastro-esophageal reflux disease without esophagitis: Secondary | ICD-10-CM | POA: Diagnosis not present

## 2016-02-29 ENCOUNTER — Encounter: Payer: Self-pay | Admitting: Podiatry

## 2016-02-29 ENCOUNTER — Ambulatory Visit (INDEPENDENT_AMBULATORY_CARE_PROVIDER_SITE_OTHER): Payer: Medicare Other

## 2016-02-29 ENCOUNTER — Ambulatory Visit (INDEPENDENT_AMBULATORY_CARE_PROVIDER_SITE_OTHER): Payer: Medicare Other | Admitting: Podiatry

## 2016-02-29 VITALS — BP 112/87 | HR 102 | Temp 97.5°F

## 2016-02-29 DIAGNOSIS — E119 Type 2 diabetes mellitus without complications: Secondary | ICD-10-CM

## 2016-02-29 DIAGNOSIS — L89891 Pressure ulcer of other site, stage 1: Secondary | ICD-10-CM

## 2016-02-29 DIAGNOSIS — L98492 Non-pressure chronic ulcer of skin of other sites with fat layer exposed: Secondary | ICD-10-CM

## 2016-02-29 DIAGNOSIS — Z0189 Encounter for other specified special examinations: Secondary | ICD-10-CM

## 2016-02-29 DIAGNOSIS — R0989 Other specified symptoms and signs involving the circulatory and respiratory systems: Secondary | ICD-10-CM

## 2016-02-29 NOTE — Patient Instructions (Signed)
Purchase over-the-counter pavidone ointment (Betadine ointment) 1% and apply to skin ulcer daily, cover with gauze and attach with Coflex tape Wear the surgical shoe on the right foot The vascular lab we'll contact you to arrange a vascular exam, lower extremity arterial Doppler If you develop any sudden increase in pain, swelling, redness, fever presents to the emergency department  Diabetes and Foot Care Diabetes may cause you to have problems because of poor blood supply (circulation) to your feet and legs. This may cause the skin on your feet to become thinner, break easier, and heal more slowly. Your skin may become dry, and the skin may peel and crack. You may also have nerve damage in your legs and feet causing decreased feeling in them. You may not notice minor injuries to your feet that could lead to infections or more serious problems. Taking care of your feet is one of the most important things you can do for yourself.  HOME CARE INSTRUCTIONS  Wear shoes at all times, even in the house. Do not go barefoot. Bare feet are easily injured.  Check your feet daily for blisters, cuts, and redness. If you cannot see the bottom of your feet, use a mirror or ask someone for help.  Wash your feet with warm water (do not use hot water) and mild soap. Then pat your feet and the areas between your toes until they are completely dry. Do not soak your feet as this can dry your skin.  Apply a moisturizing lotion or petroleum jelly (that does not contain alcohol and is unscented) to the skin on your feet and to dry, brittle toenails. Do not apply lotion between your toes.  Trim your toenails straight across. Do not dig under them or around the cuticle. File the edges of your nails with an emery board or nail file.  Do not cut corns or calluses or try to remove them with medicine.  Wear clean socks or stockings every day. Make sure they are not too tight. Do not wear knee-high stockings since they may  decrease blood flow to your legs.  Wear shoes that fit properly and have enough cushioning. To break in new shoes, wear them for just a few hours a day. This prevents you from injuring your feet. Always look in your shoes before you put them on to be sure there are no objects inside.  Do not cross your legs. This may decrease the blood flow to your feet.  If you find a minor scrape, cut, or break in the skin on your feet, keep it and the skin around it clean and dry. These areas may be cleansed with mild soap and water. Do not cleanse the area with peroxide, alcohol, or iodine.  When you remove an adhesive bandage, be sure not to damage the skin around it.  If you have a wound, look at it several times a day to make sure it is healing.  Do not use heating pads or hot water bottles. They may burn your skin. If you have lost feeling in your feet or legs, you may not know it is happening until it is too late.  Make sure your health care provider performs a complete foot exam at least annually or more often if you have foot problems. Report any cuts, sores, or bruises to your health care provider immediately. SEEK MEDICAL CARE IF:   You have an injury that is not healing.  You have cuts or breaks in the skin.  You have an ingrown nail.  You notice redness on your legs or feet.  You feel burning or tingling in your legs or feet.  You have pain or cramps in your legs and feet.  Your legs or feet are numb.  Your feet always feel cold. SEEK IMMEDIATE MEDICAL CARE IF:   There is increasing redness, swelling, or pain in or around a wound.  There is a red line that goes up your leg.  Pus is coming from a wound.  You develop a fever or as directed by your health care provider.  You notice a bad smell coming from an ulcer or wound.   This information is not intended to replace advice given to you by your health care provider. Make sure you discuss any questions you have with your  health care provider.   Document Released: 06/15/2000 Document Revised: 02/18/2013 Document Reviewed: 11/25/2012 Elsevier Interactive Patient Education [employer].

## 2016-02-29 NOTE — Progress Notes (Signed)
   Subjective:    Patient ID: William [name], male    DOB: [birth year], 65 y.o.   MRN: [id number]  HPI   This patient presents today complaining of a skin lesion on the right great toe that is been present for approximately 3 months. Patient describes treating this area with coconut oil and Bactine spray for the past 3 months and says that the area significantly improved since home treatment was initiated. The patient is requesting evaluation of this skin lesion. Patient also mentions that his toenails are elongated and uncomfortable with shoe wearing walking.[name] He also is interested in diabetic shoes  Patient is a diabetic with apparent history of skin ulceration, claudication and no history of amputation  Patient is a cigarette smoker The patient's daughters present in the treatment room today      Review of Systems  Musculoskeletal: Positive for gait problem.  All other systems reviewed and are negative.      Objective:   Physical Exam  Patient appears orientated 3  Vascular: No calf edema or calf tenderness bilaterally DP pulses 0/4 bilaterally PT pulses 0/4 bilaterally Capillary reflex delay bilaterally  Neurological: Sensation to 10 g monofilament wire intact 1/5 right and 3/5 left Vibratory sensation nonreactive bilaterally Ankle reflexes reactive bilaterally  Dermatological: Eschar medial right hallux without any surrounding erythema, edema, malodor or drainage After debridement the areas to 0.0 cm x 1.0 cm with a fibrous base surrounded by a hyperkeratotic rim. The area does probe deep to bone.   Musculoskeletal: Manual motor testing Dorsi flexion, plantar flexion, inversion, eversion 5/5 bilaterally   X-ray examination right foot dated 02/29/2016  Intact bony structures without fracture and/or dislocation Soft tissue outlines ulcer medial right hallux without any increase in the soft tissue density in the skin ulcer area No evidence of bony changes in  around the ulcer on the medial right hallux  Radiographic impression: No acute bony abnormality noted in the right foot with particular attention to the medial right hallux with the overlying skin ulcer      Assessment & Plan:   Assessment: Diabetic peripheral arterial disease Diabetic peripheral neuropathy Clinically not infected skin ulcer plantar right hallux  Plan: Today I reviewed the results of the x-ray and examination with patient and patient's daughter. I made him aware that this wound was a serious wound which could result in patients loss of foot, leg or like. I advised patient to discontinue smoking.  The wound was debrided and packed with Iodosorb gel. Patient was advised to apply Betadine ointment into the wound daily, cover with gauze and attach with Coflex tape A surgical shoe with Plastizote insole was dispensed to wear on the right foot Patient was advised that she knows any sudden increase in pain, swelling, redness to present to the emergency department Our office will contact vascular lab for lower extremity arterial Doppler for the indication of diabetic with absent pedal pulses, skin ulcer  Reappoint 7 days

## 2016-03-02 ENCOUNTER — Telehealth: Payer: Self-pay | Admitting: *Deleted

## 2016-03-02 NOTE — Telephone Encounter (Signed)
Faxed orders to [facility]. 

## 2016-03-07 ENCOUNTER — Ambulatory Visit (INDEPENDENT_AMBULATORY_CARE_PROVIDER_SITE_OTHER): Payer: Medicare Other | Admitting: Podiatry

## 2016-03-07 NOTE — Progress Notes (Signed)
NO SHOW-ERRONEOUS ENCOUNTER 

## 2016-03-08 ENCOUNTER — Other Ambulatory Visit: Payer: Self-pay | Admitting: Podiatry

## 2016-03-08 DIAGNOSIS — E119 Type 2 diabetes mellitus without complications: Secondary | ICD-10-CM

## 2016-03-08 DIAGNOSIS — Z0189 Encounter for other specified special examinations: Secondary | ICD-10-CM

## 2016-03-08 DIAGNOSIS — R0989 Other specified symptoms and signs involving the circulatory and respiratory systems: Secondary | ICD-10-CM

## 2016-03-09 ENCOUNTER — Ambulatory Visit (HOSPITAL_COMMUNITY)
Admission: RE | Admit: 2016-03-09 | Discharge: 2016-03-09 | Disposition: A | Discharge status: Home / Self Care | Payer: Medicare Other | Source: Ambulatory Visit | Attending: Cardiology | Admitting: Cardiology

## 2016-03-09 DIAGNOSIS — L97909 Non-pressure chronic ulcer of unspecified part of unspecified lower leg with unspecified severity: Secondary | ICD-10-CM | POA: Diagnosis not present

## 2016-03-09 DIAGNOSIS — R938 Abnormal findings on diagnostic imaging of other specified body structures: Secondary | ICD-10-CM | POA: Insufficient documentation

## 2016-03-09 DIAGNOSIS — E785 Hyperlipidemia, unspecified: Secondary | ICD-10-CM | POA: Diagnosis not present

## 2016-03-09 DIAGNOSIS — I10 Essential (primary) hypertension: Secondary | ICD-10-CM | POA: Diagnosis not present

## 2016-03-09 DIAGNOSIS — I771 Stricture of artery: Secondary | ICD-10-CM | POA: Insufficient documentation

## 2016-03-09 DIAGNOSIS — R0989 Other specified symptoms and signs involving the circulatory and respiratory systems: Secondary | ICD-10-CM | POA: Insufficient documentation

## 2016-03-09 DIAGNOSIS — Z720 Tobacco use: Secondary | ICD-10-CM | POA: Insufficient documentation

## 2016-03-09 DIAGNOSIS — E119 Type 2 diabetes mellitus without complications: Secondary | ICD-10-CM | POA: Insufficient documentation

## 2016-03-09 DIAGNOSIS — E1159 Type 2 diabetes mellitus with other circulatory complications: Secondary | ICD-10-CM | POA: Diagnosis not present

## 2016-03-12 DIAGNOSIS — E1136 Type 2 diabetes mellitus with diabetic cataract: Secondary | ICD-10-CM | POA: Diagnosis not present

## 2016-03-12 DIAGNOSIS — L84 Corns and callosities: Secondary | ICD-10-CM | POA: Diagnosis not present

## 2016-03-12 DIAGNOSIS — J069 Acute upper respiratory infection, unspecified: Secondary | ICD-10-CM | POA: Diagnosis not present

## 2016-03-12 DIAGNOSIS — I10 Essential (primary) hypertension: Secondary | ICD-10-CM | POA: Diagnosis not present

## 2016-03-13 ENCOUNTER — Inpatient Hospital Stay (HOSPITAL_COMMUNITY): Payer: Medicare Other

## 2016-03-13 ENCOUNTER — Inpatient Hospital Stay (HOSPITAL_COMMUNITY)
Admit: 2016-03-13 | Discharge: 2016-04-03 | DRG: 228 | Disposition: A | Discharge status: Home Health | Payer: Medicare Other | Source: Other Acute Inpatient Hospital | Attending: Cardiothoracic Surgery | Admitting: Cardiothoracic Surgery

## 2016-03-13 ENCOUNTER — Ambulatory Visit (HOSPITAL_COMMUNITY): Payer: Medicare Other | Admitting: Anesthesiology

## 2016-03-13 ENCOUNTER — Ambulatory Visit (INDEPENDENT_AMBULATORY_CARE_PROVIDER_SITE_OTHER): Payer: Medicare Other | Admitting: Cardiovascular Disease

## 2016-03-13 ENCOUNTER — Encounter (HOSPITAL_COMMUNITY): Payer: Self-pay | Admitting: Anesthesiology

## 2016-03-13 ENCOUNTER — Ambulatory Visit (HOSPITAL_COMMUNITY): Payer: Medicare Other

## 2016-03-13 ENCOUNTER — Ambulatory Visit (HOSPITAL_BASED_OUTPATIENT_CLINIC_OR_DEPARTMENT_OTHER): Payer: Medicare Other

## 2016-03-13 ENCOUNTER — Encounter: Payer: Self-pay | Admitting: Cardiovascular Disease

## 2016-03-13 ENCOUNTER — Encounter (HOSPITAL_COMMUNITY): Disposition: A | Discharge status: Home Health | Payer: Self-pay | Attending: Cardiothoracic Surgery

## 2016-03-13 VITALS — HR 111 | Ht 65.0 in | Wt 162.0 lb

## 2016-03-13 DIAGNOSIS — I96 Gangrene, not elsewhere classified: Secondary | ICD-10-CM

## 2016-03-13 DIAGNOSIS — E1165 Type 2 diabetes mellitus with hyperglycemia: Secondary | ICD-10-CM | POA: Diagnosis present

## 2016-03-13 DIAGNOSIS — I70261 Atherosclerosis of native arteries of extremities with gangrene, right leg: Secondary | ICD-10-CM | POA: Diagnosis not present

## 2016-03-13 DIAGNOSIS — Z8774 Personal history of (corrected) congenital malformations of heart and circulatory system: Secondary | ICD-10-CM

## 2016-03-13 DIAGNOSIS — I252 Old myocardial infarction: Secondary | ICD-10-CM

## 2016-03-13 DIAGNOSIS — M25511 Pain in right shoulder: Secondary | ICD-10-CM | POA: Diagnosis not present

## 2016-03-13 DIAGNOSIS — Z23 Encounter for immunization: Secondary | ICD-10-CM

## 2016-03-13 DIAGNOSIS — J151 Pneumonia due to Pseudomonas: Secondary | ICD-10-CM | POA: Diagnosis not present

## 2016-03-13 DIAGNOSIS — R000 Tachycardia, unspecified: Secondary | ICD-10-CM | POA: Diagnosis not present

## 2016-03-13 DIAGNOSIS — E785 Hyperlipidemia, unspecified: Secondary | ICD-10-CM | POA: Diagnosis not present

## 2016-03-13 DIAGNOSIS — N179 Acute kidney failure, unspecified: Secondary | ICD-10-CM | POA: Diagnosis not present

## 2016-03-13 DIAGNOSIS — I255 Ischemic cardiomyopathy: Secondary | ICD-10-CM | POA: Diagnosis present

## 2016-03-13 DIAGNOSIS — E872 Acidosis: Secondary | ICD-10-CM | POA: Diagnosis present

## 2016-03-13 DIAGNOSIS — D689 Coagulation defect, unspecified: Secondary | ICD-10-CM | POA: Diagnosis not present

## 2016-03-13 DIAGNOSIS — I2119 ST elevation (STEMI) myocardial infarction involving other coronary artery of inferior wall: Secondary | ICD-10-CM

## 2016-03-13 DIAGNOSIS — R0602 Shortness of breath: Secondary | ICD-10-CM | POA: Diagnosis not present

## 2016-03-13 DIAGNOSIS — M7989 Other specified soft tissue disorders: Secondary | ICD-10-CM | POA: Diagnosis not present

## 2016-03-13 DIAGNOSIS — Q210 Ventricular septal defect: Secondary | ICD-10-CM | POA: Diagnosis not present

## 2016-03-13 DIAGNOSIS — I25110 Atherosclerotic heart disease of native coronary artery with unstable angina pectoris: Secondary | ICD-10-CM | POA: Diagnosis not present

## 2016-03-13 DIAGNOSIS — I2510 Atherosclerotic heart disease of native coronary artery without angina pectoris: Secondary | ICD-10-CM | POA: Diagnosis not present

## 2016-03-13 DIAGNOSIS — I739 Peripheral vascular disease, unspecified: Secondary | ICD-10-CM | POA: Diagnosis not present

## 2016-03-13 DIAGNOSIS — I480 Paroxysmal atrial fibrillation: Secondary | ICD-10-CM

## 2016-03-13 DIAGNOSIS — D62 Acute posthemorrhagic anemia: Secondary | ICD-10-CM | POA: Diagnosis not present

## 2016-03-13 DIAGNOSIS — K5900 Constipation, unspecified: Secondary | ICD-10-CM | POA: Diagnosis not present

## 2016-03-13 DIAGNOSIS — R079 Chest pain, unspecified: Secondary | ICD-10-CM

## 2016-03-13 DIAGNOSIS — I219 Acute myocardial infarction, unspecified: Secondary | ICD-10-CM | POA: Diagnosis present

## 2016-03-13 DIAGNOSIS — R570 Cardiogenic shock: Secondary | ICD-10-CM | POA: Diagnosis not present

## 2016-03-13 DIAGNOSIS — R262 Difficulty in walking, not elsewhere classified: Secondary | ICD-10-CM

## 2016-03-13 DIAGNOSIS — E8809 Other disorders of plasma-protein metabolism, not elsewhere classified: Secondary | ICD-10-CM

## 2016-03-13 DIAGNOSIS — I21A1 Myocardial infarction type 2: Principal | ICD-10-CM | POA: Diagnosis present

## 2016-03-13 DIAGNOSIS — I110 Hypertensive heart disease with heart failure: Secondary | ICD-10-CM | POA: Diagnosis present

## 2016-03-13 DIAGNOSIS — F17210 Nicotine dependence, cigarettes, uncomplicated: Secondary | ICD-10-CM | POA: Diagnosis present

## 2016-03-13 DIAGNOSIS — E871 Hypo-osmolality and hyponatremia: Secondary | ICD-10-CM | POA: Diagnosis present

## 2016-03-13 DIAGNOSIS — I5021 Acute systolic (congestive) heart failure: Secondary | ICD-10-CM | POA: Diagnosis present

## 2016-03-13 DIAGNOSIS — I340 Nonrheumatic mitral (valve) insufficiency: Secondary | ICD-10-CM | POA: Diagnosis not present

## 2016-03-13 DIAGNOSIS — N5089 Other specified disorders of the male genital organs: Secondary | ICD-10-CM | POA: Diagnosis present

## 2016-03-13 DIAGNOSIS — I2583 Coronary atherosclerosis due to lipid rich plaque: Secondary | ICD-10-CM

## 2016-03-13 DIAGNOSIS — Z7984 Long term (current) use of oral hypoglycemic drugs: Secondary | ICD-10-CM

## 2016-03-13 DIAGNOSIS — Y95 Nosocomial condition: Secondary | ICD-10-CM | POA: Diagnosis not present

## 2016-03-13 DIAGNOSIS — Z951 Presence of aortocoronary bypass graft: Secondary | ICD-10-CM | POA: Diagnosis not present

## 2016-03-13 DIAGNOSIS — E11621 Type 2 diabetes mellitus with foot ulcer: Secondary | ICD-10-CM | POA: Diagnosis present

## 2016-03-13 DIAGNOSIS — M6281 Muscle weakness (generalized): Secondary | ICD-10-CM

## 2016-03-13 DIAGNOSIS — Z4682 Encounter for fitting and adjustment of non-vascular catheter: Secondary | ICD-10-CM | POA: Diagnosis not present

## 2016-03-13 DIAGNOSIS — Z79899 Other long term (current) drug therapy: Secondary | ICD-10-CM | POA: Diagnosis not present

## 2016-03-13 DIAGNOSIS — I10 Essential (primary) hypertension: Secondary | ICD-10-CM | POA: Diagnosis not present

## 2016-03-13 DIAGNOSIS — S98111A Complete traumatic amputation of right great toe, initial encounter: Secondary | ICD-10-CM

## 2016-03-13 DIAGNOSIS — L03031 Cellulitis of right toe: Secondary | ICD-10-CM | POA: Diagnosis not present

## 2016-03-13 DIAGNOSIS — J9811 Atelectasis: Secondary | ICD-10-CM | POA: Diagnosis not present

## 2016-03-13 DIAGNOSIS — E1152 Type 2 diabetes mellitus with diabetic peripheral angiopathy with gangrene: Secondary | ICD-10-CM | POA: Diagnosis not present

## 2016-03-13 DIAGNOSIS — L03115 Cellulitis of right lower limb: Secondary | ICD-10-CM | POA: Diagnosis not present

## 2016-03-13 DIAGNOSIS — L97519 Non-pressure chronic ulcer of other part of right foot with unspecified severity: Secondary | ICD-10-CM | POA: Diagnosis present

## 2016-03-13 DIAGNOSIS — Z794 Long term (current) use of insulin: Secondary | ICD-10-CM | POA: Diagnosis not present

## 2016-03-13 DIAGNOSIS — R0789 Other chest pain: Secondary | ICD-10-CM

## 2016-03-13 DIAGNOSIS — E876 Hypokalemia: Secondary | ICD-10-CM | POA: Diagnosis not present

## 2016-03-13 DIAGNOSIS — J90 Pleural effusion, not elsewhere classified: Secondary | ICD-10-CM | POA: Diagnosis not present

## 2016-03-13 DIAGNOSIS — I5022 Chronic systolic (congestive) heart failure: Secondary | ICD-10-CM | POA: Diagnosis not present

## 2016-03-13 DIAGNOSIS — I080 Rheumatic disorders of both mitral and aortic valves: Secondary | ICD-10-CM | POA: Diagnosis not present

## 2016-03-13 DIAGNOSIS — I959 Hypotension, unspecified: Secondary | ICD-10-CM | POA: Diagnosis not present

## 2016-03-13 DIAGNOSIS — R918 Other nonspecific abnormal finding of lung field: Secondary | ICD-10-CM | POA: Diagnosis not present

## 2016-03-13 HISTORY — PX: VSD REPAIR: SHX276

## 2016-03-13 HISTORY — PX: CORONARY ARTERY BYPASS GRAFT: SHX141

## 2016-03-13 HISTORY — PX: CARDIAC CATHETERIZATION: SHX172

## 2016-03-13 HISTORY — PX: TEE WITHOUT CARDIOVERSION: SHX5443

## 2016-03-13 LAB — POCT I-STAT 3, ART BLOOD GAS (G3+)
ACID-BASE DEFICIT: 10 mmol/L — AB (ref 0.0–2.0)
ACID-BASE DEFICIT: 1 mmol/L (ref 0.0–2.0)
ACID-BASE DEFICIT: 5 mmol/L — AB (ref 0.0–2.0)
Acid-base deficit: 7 mmol/L — ABNORMAL HIGH (ref 0.0–2.0)
Acid-base deficit: 4 mmol/L — ABNORMAL HIGH (ref 0.0–2.0)
BICARBONATE: 16.7 mmol/L — AB (ref 20.0–28.0)
Bicarbonate: 17.1 mmol/L — ABNORMAL LOW (ref 20.0–28.0)
Bicarbonate: 23.8 mmol/L (ref 20.0–28.0)
Bicarbonate: 22 mmol/L (ref 20.0–28.0)
Bicarbonate: 21.8 mmol/L (ref 20.0–28.0)
O2 SAT: 100 %
O2 SAT: 100 %
O2 Saturation: 96 %
O2 Saturation: 100 %
O2 Saturation: 98 %
PCO2 ART: 39.2 mmHg (ref 32.0–48.0)
PCO2 ART: 41 mmHg (ref 32.0–48.0)
PH ART: 7.337 — AB (ref 7.350–7.450)
PO2 ART: 80 mmHg — AB (ref 83.0–108.0)
PO2 ART: 366 mmHg — AB (ref 83.0–108.0)
TCO2: 18 mmol/L (ref 0–100)
TCO2: 18 mmol/L (ref 0–100)
TCO2: 25 mmol/L (ref 0–100)
TCO2: 23 mmol/L (ref 0–100)
TCO2: 23 mmol/L (ref 0–100)
pCO2 arterial: 28.2 mmHg — ABNORMAL LOW (ref 32.0–48.0)
pCO2 arterial: 37.5 mmHg (ref 32.0–48.0)
pCO2 arterial: 46.2 mmHg (ref 32.0–48.0)
pH, Arterial: 7.38 (ref 7.350–7.450)
pH, Arterial: 7.248 — ABNORMAL LOW (ref 7.350–7.450)
pH, Arterial: 7.411 (ref 7.350–7.450)
pH, Arterial: 7.279 — ABNORMAL LOW (ref 7.350–7.450)
pO2, Arterial: 317 mmHg — ABNORMAL HIGH (ref 83.0–108.0)
pO2, Arterial: 368 mmHg — ABNORMAL HIGH (ref 83.0–108.0)
pO2, Arterial: 110 mmHg — ABNORMAL HIGH (ref 83.0–108.0)

## 2016-03-13 LAB — ECHOCARDIOGRAM LIMITED
Height: 65 in
WEIGHTICAEL: 2592 [oz_av]

## 2016-03-13 LAB — POCT I-STAT, CHEM 8
BUN: 31 mg/dL — ABNORMAL HIGH (ref 6–20)
BUN: 27 mg/dL — AB (ref 6–20)
BUN: 25 mg/dL — AB (ref 6–20)
BUN: 28 mg/dL — AB (ref 6–20)
BUN: 26 mg/dL — AB (ref 6–20)
BUN: 26 mg/dL — ABNORMAL HIGH (ref 6–20)
BUN: 26 mg/dL — ABNORMAL HIGH (ref 6–20)
BUN: 24 mg/dL — ABNORMAL HIGH (ref 6–20)
CALCIUM ION: 0.99 mmol/L — AB (ref 1.15–1.40)
CALCIUM ION: 0.99 mmol/L — AB (ref 1.15–1.40)
CALCIUM ION: 1.12 mmol/L — AB (ref 1.15–1.40)
CALCIUM ION: 1.04 mmol/L — AB (ref 1.15–1.40)
CALCIUM ION: 1.17 mmol/L (ref 1.15–1.40)
CHLORIDE: 99 mmol/L — AB (ref 101–111)
CHLORIDE: 104 mmol/L (ref 101–111)
CHLORIDE: 100 mmol/L — AB (ref 101–111)
CHLORIDE: 101 mmol/L (ref 101–111)
CREATININE: 1 mg/dL (ref 0.61–1.24)
CREATININE: 1 mg/dL (ref 0.61–1.24)
CREATININE: 1.1 mg/dL (ref 0.61–1.24)
Calcium, Ion: 1.15 mmol/L (ref 1.15–1.40)
Calcium, Ion: 1.02 mmol/L — ABNORMAL LOW (ref 1.15–1.40)
Calcium, Ion: 1.02 mmol/L — ABNORMAL LOW (ref 1.15–1.40)
Chloride: 96 mmol/L — ABNORMAL LOW (ref 101–111)
Chloride: 96 mmol/L — ABNORMAL LOW (ref 101–111)
Chloride: 98 mmol/L — ABNORMAL LOW (ref 101–111)
Chloride: 98 mmol/L — ABNORMAL LOW (ref 101–111)
Creatinine, Ser: 1.4 mg/dL — ABNORMAL HIGH (ref 0.61–1.24)
Creatinine, Ser: 1 mg/dL (ref 0.61–1.24)
Creatinine, Ser: 1.1 mg/dL (ref 0.61–1.24)
Creatinine, Ser: 1.1 mg/dL (ref 0.61–1.24)
Creatinine, Ser: 0.9 mg/dL (ref 0.61–1.24)
GLUCOSE: 116 mg/dL — AB (ref 65–99)
GLUCOSE: 92 mg/dL (ref 65–99)
GLUCOSE: 93 mg/dL (ref 65–99)
GLUCOSE: 103 mg/dL — AB (ref 65–99)
Glucose, Bld: 107 mg/dL — ABNORMAL HIGH (ref 65–99)
Glucose, Bld: 130 mg/dL — ABNORMAL HIGH (ref 65–99)
Glucose, Bld: 104 mg/dL — ABNORMAL HIGH (ref 65–99)
Glucose, Bld: 173 mg/dL — ABNORMAL HIGH (ref 65–99)
HCT: 29 % — ABNORMAL LOW (ref 39.0–52.0)
HCT: 23 % — ABNORMAL LOW (ref 39.0–52.0)
HCT: 26 % — ABNORMAL LOW (ref 39.0–52.0)
HCT: 23 % — ABNORMAL LOW (ref 39.0–52.0)
HCT: 21 % — ABNORMAL LOW (ref 39.0–52.0)
HCT: 26 % — ABNORMAL LOW (ref 39.0–52.0)
HEMATOCRIT: 30 % — AB (ref 39.0–52.0)
HEMATOCRIT: 21 % — AB (ref 39.0–52.0)
HEMOGLOBIN: 7.8 g/dL — AB (ref 13.0–17.0)
HEMOGLOBIN: 7.1 g/dL — AB (ref 13.0–17.0)
HEMOGLOBIN: 7.1 g/dL — AB (ref 13.0–17.0)
Hemoglobin: 10.2 g/dL — ABNORMAL LOW (ref 13.0–17.0)
Hemoglobin: 9.9 g/dL — ABNORMAL LOW (ref 13.0–17.0)
Hemoglobin: 7.8 g/dL — ABNORMAL LOW (ref 13.0–17.0)
Hemoglobin: 8.8 g/dL — ABNORMAL LOW (ref 13.0–17.0)
Hemoglobin: 8.8 g/dL — ABNORMAL LOW (ref 13.0–17.0)
POTASSIUM: 3.8 mmol/L (ref 3.5–5.1)
POTASSIUM: 4.1 mmol/L (ref 3.5–5.1)
Potassium: 3.5 mmol/L (ref 3.5–5.1)
Potassium: 3.3 mmol/L — ABNORMAL LOW (ref 3.5–5.1)
Potassium: 3.3 mmol/L — ABNORMAL LOW (ref 3.5–5.1)
Potassium: 3.3 mmol/L — ABNORMAL LOW (ref 3.5–5.1)
Potassium: 3.3 mmol/L — ABNORMAL LOW (ref 3.5–5.1)
Potassium: 3.3 mmol/L — ABNORMAL LOW (ref 3.5–5.1)
SODIUM: 135 mmol/L (ref 135–145)
SODIUM: 131 mmol/L — AB (ref 135–145)
SODIUM: 132 mmol/L — AB (ref 135–145)
SODIUM: 135 mmol/L (ref 135–145)
Sodium: 129 mmol/L — ABNORMAL LOW (ref 135–145)
Sodium: 132 mmol/L — ABNORMAL LOW (ref 135–145)
Sodium: 135 mmol/L (ref 135–145)
Sodium: 133 mmol/L — ABNORMAL LOW (ref 135–145)
TCO2: 18 mmol/L (ref 0–100)
TCO2: 19 mmol/L (ref 0–100)
TCO2: 25 mmol/L (ref 0–100)
TCO2: 26 mmol/L (ref 0–100)
TCO2: 25 mmol/L (ref 0–100)
TCO2: 24 mmol/L (ref 0–100)
TCO2: 24 mmol/L (ref 0–100)
TCO2: 23 mmol/L (ref 0–100)

## 2016-03-13 LAB — TROPONIN I: Troponin I: 1.51 ng/mL (ref <0.03)

## 2016-03-13 LAB — CBC
HCT: 38.8 % — ABNORMAL LOW (ref 39.0–52.0)
HCT: 33.5 % — ABNORMAL LOW (ref 39.0–52.0)
HEMOGLOBIN: 13.6 g/dL (ref 13.0–17.0)
HEMOGLOBIN: 11.5 g/dL — AB (ref 13.0–17.0)
MCH: 29.8 pg (ref 26.0–34.0)
MCH: 29.9 pg (ref 26.0–34.0)
MCHC: 35.1 g/dL (ref 30.0–36.0)
MCHC: 34.3 g/dL (ref 30.0–36.0)
MCV: 85.1 fL (ref 78.0–100.0)
MCV: 87.2 fL (ref 78.0–100.0)
Platelets: 233 10*3/uL (ref 150–400)
Platelets: 180 10*3/uL (ref 150–400)
RBC: 4.56 MIL/uL (ref 4.22–5.81)
RBC: 3.84 MIL/uL — ABNORMAL LOW (ref 4.22–5.81)
RDW: 12.8 % (ref 11.5–15.5)
RDW: 12.8 % (ref 11.5–15.5)
WBC: 10.7 10*3/uL — ABNORMAL HIGH (ref 4.0–10.5)
WBC: 25.4 10*3/uL — ABNORMAL HIGH (ref 4.0–10.5)

## 2016-03-13 LAB — PREPARE RBC (CROSSMATCH)

## 2016-03-13 LAB — POCT I-STAT 4, (NA,K, GLUC, HGB,HCT)
Glucose, Bld: 138 mg/dL — ABNORMAL HIGH (ref 65–99)
HCT: 32 % — ABNORMAL LOW (ref 39.0–52.0)
Hemoglobin: 10.9 g/dL — ABNORMAL LOW (ref 13.0–17.0)
POTASSIUM: 3.1 mmol/L — AB (ref 3.5–5.1)
SODIUM: 139 mmol/L (ref 135–145)

## 2016-03-13 LAB — POCT I-STAT 3, VENOUS BLOOD GAS (G3P V)
ACID-BASE DEFICIT: 8 mmol/L — AB (ref 0.0–2.0)
Acid-base deficit: 7 mmol/L — ABNORMAL HIGH (ref 0.0–2.0)
BICARBONATE: 16.9 mmol/L — AB (ref 20.0–28.0)
BICARBONATE: 17.2 mmol/L — AB (ref 20.0–28.0)
O2 SAT: 77 %
O2 Saturation: 49 %
PCO2 VEN: 32.4 mmHg — AB (ref 44.0–60.0)
PH VEN: 7.334 (ref 7.250–7.430)
TCO2: 18 mmol/L (ref 0–100)
TCO2: 18 mmol/L (ref 0–100)
pCO2, Ven: 30 mmHg — ABNORMAL LOW (ref 44.0–60.0)
pH, Ven: 7.358 (ref 7.250–7.430)
pO2, Ven: 28 mmHg — CL (ref 32.0–45.0)
pO2, Ven: 42 mmHg (ref 32.0–45.0)

## 2016-03-13 LAB — COMPREHENSIVE METABOLIC PANEL
ALBUMIN: 2.7 g/dL — AB (ref 3.5–5.0)
ALT: 19 U/L (ref 17–63)
ANION GAP: 11 (ref 5–15)
AST: 19 U/L (ref 15–41)
Alkaline Phosphatase: 155 U/L — ABNORMAL HIGH (ref 38–126)
BUN: 32 mg/dL — ABNORMAL HIGH (ref 6–20)
CO2: 17 mmol/L — AB (ref 22–32)
Calcium: 8.5 mg/dL — ABNORMAL LOW (ref 8.9–10.3)
Chloride: 101 mmol/L (ref 101–111)
Creatinine, Ser: 1.57 mg/dL — ABNORMAL HIGH (ref 0.61–1.24)
GFR calc Af Amer: 52 mL/min — ABNORMAL LOW (ref >60)
GFR calc non Af Amer: 45 mL/min — ABNORMAL LOW (ref >60)
GLUCOSE: 115 mg/dL — AB (ref 65–99)
POTASSIUM: 3.7 mmol/L (ref 3.5–5.1)
SODIUM: 129 mmol/L — AB (ref 135–145)
TOTAL PROTEIN: 5.9 g/dL — AB (ref 6.5–8.1)
Total Bilirubin: 0.9 mg/dL (ref 0.3–1.2)

## 2016-03-13 LAB — LIPID PANEL
CHOL/HDL RATIO: 4.2 ratio
Cholesterol: 96 mg/dL (ref 0–200)
HDL: 23 mg/dL — AB (ref >40)
LDL CALC: 44 mg/dL (ref 0–99)
Triglycerides: 146 mg/dL (ref <150)
VLDL: 29 mg/dL (ref 0–40)

## 2016-03-13 LAB — GLUCOSE, CAPILLARY
GLUCOSE-CAPILLARY: 127 mg/dL — AB (ref 65–99)
Glucose-Capillary: 128 mg/dL — ABNORMAL HIGH (ref 65–99)
Glucose-Capillary: 138 mg/dL — ABNORMAL HIGH (ref 65–99)

## 2016-03-13 LAB — PREPARE PLATELET PHERESIS: UNIT DIVISION: 0

## 2016-03-13 LAB — ABO/RH: ABO/RH(D): O POS

## 2016-03-13 LAB — HEMOGLOBIN AND HEMATOCRIT, BLOOD
HCT: 19.2 % — ABNORMAL LOW (ref 39.0–52.0)
Hemoglobin: 6.6 g/dL — CL (ref 13.0–17.0)

## 2016-03-13 LAB — FIBRINOGEN: Fibrinogen: 450 mg/dL (ref 210–475)

## 2016-03-13 LAB — APTT
aPTT: 42 seconds — ABNORMAL HIGH (ref 24–36)
aPTT: 39 seconds — ABNORMAL HIGH (ref 24–36)

## 2016-03-13 LAB — PROTIME-INR
INR: 0.96
INR: 1.42
PROTHROMBIN TIME: 12.8 s (ref 11.4–15.2)
PROTHROMBIN TIME: 17.5 s — AB (ref 11.4–15.2)

## 2016-03-13 LAB — PLATELET COUNT: Platelets: 185 10*3/uL (ref 150–400)

## 2016-03-13 SURGERY — CORONARY ARTERY BYPASS GRAFTING (CABG)
Anesthesia: General | Site: Chest

## 2016-03-13 SURGERY — RIGHT/LEFT HEART CATH AND CORONARY ANGIOGRAPHY

## 2016-03-13 MED ORDER — BISACODYL 5 MG PO TBEC
10.0000 mg | DELAYED_RELEASE_TABLET | Freq: Every day | ORAL | Status: DC
  Administered 2016-03-15 – 2016-03-31 (×17): 10 mg via ORAL
  Filled 2016-03-13 (×19): qty 2

## 2016-03-13 MED ORDER — LIDOCAINE HCL (PF) 1 % IJ SOLN
INTRAMUSCULAR | Status: DC | PRN
  Administered 2016-03-13: 5 mL
  Administered 2016-03-13: 2 mL
  Administered 2016-03-13: 20 mL

## 2016-03-13 MED ORDER — MAGNESIUM SULFATE 4 GM/100ML IV SOLN
4.0000 g | Freq: Once | INTRAVENOUS | Status: AC
  Administered 2016-03-13: 4 g via INTRAVENOUS
  Filled 2016-03-13: qty 100

## 2016-03-13 MED ORDER — SODIUM CHLORIDE 0.9 % IV SOLN: Freq: Once | INTRAVENOUS | Status: DC

## 2016-03-13 MED ORDER — SODIUM CHLORIDE 0.9 % IV SOLN
INTRAVENOUS | Status: DC
  Administered 2016-03-14: 04:00:00 via INTRAVENOUS
  Filled 2016-03-13 (×4): qty 2.5

## 2016-03-13 MED ORDER — ACETAMINOPHEN 160 MG/5ML PO SOLN
1000.0000 mg | Freq: Four times a day (QID) | ORAL | Status: AC
  Administered 2016-03-14 (×3): 1000 mg
  Filled 2016-03-13 (×2): qty 40.6

## 2016-03-13 MED ORDER — EPINEPHRINE HCL 1 MG/ML IJ SOLN
0.0000 ug/min | INTRAMUSCULAR | Status: DC
  Filled 2016-03-13: qty 4

## 2016-03-13 MED ORDER — DEXTROSE 5 % IV SOLN
1.5000 g | INTRAVENOUS | Status: AC
  Administered 2016-03-13: .75 g via INTRAVENOUS
  Administered 2016-03-13: 1.5 g via INTRAVENOUS
  Filled 2016-03-13: qty 1.5

## 2016-03-13 MED ORDER — FENTANYL CITRATE (PF) 100 MCG/2ML IJ SOLN
INTRAMUSCULAR | Status: AC
  Filled 2016-03-13: qty 2

## 2016-03-13 MED ORDER — HEPARIN (PORCINE) IN NACL 2-0.9 UNIT/ML-% IJ SOLN
INTRAMUSCULAR | Status: AC
  Filled 2016-03-13: qty 500

## 2016-03-13 MED ORDER — HEPARIN (PORCINE) IN NACL 2-0.9 UNIT/ML-% IJ SOLN
INTRAMUSCULAR | Status: DC | PRN
  Administered 2016-03-13: 1500 mL

## 2016-03-13 MED ORDER — SODIUM CHLORIDE 0.9 % IV SOLN
INTRAVENOUS | Status: DC
  Filled 2016-03-13: qty 30

## 2016-03-13 MED ORDER — CHLORHEXIDINE GLUCONATE 0.12% ORAL RINSE (MEDLINE KIT)
15.0000 mL | Freq: Two times a day (BID) | OROMUCOSAL | Status: DC
  Administered 2016-03-14: 15 mL via OROMUCOSAL

## 2016-03-13 MED ORDER — DOPAMINE-DEXTROSE 3.2-5 MG/ML-% IV SOLN
0.0000 ug/kg/min | INTRAVENOUS | Status: DC
  Filled 2016-03-13: qty 250

## 2016-03-13 MED ORDER — HEMOSTATIC AGENTS (NO CHARGE) OPTIME
TOPICAL | Status: DC | PRN
  Administered 2016-03-13: 1 via TOPICAL

## 2016-03-13 MED ORDER — BISACODYL 5 MG PO TBEC: 5.0000 mg | DELAYED_RELEASE_TABLET | Freq: Once | ORAL | Status: DC

## 2016-03-13 MED ORDER — NOREPINEPHRINE BITARTRATE 1 MG/ML IV SOLN
0.0000 ug/min | INTRAVENOUS | Status: DC
  Filled 2016-03-13: qty 4

## 2016-03-13 MED ORDER — DEXTROSE 5 % IV SOLN
750.0000 mg | INTRAVENOUS | Status: DC
  Filled 2016-03-13: qty 750

## 2016-03-13 MED ORDER — PHENYLEPHRINE HCL 10 MG/ML IJ SOLN
0.0000 ug/min | INTRAVENOUS | Status: DC
  Administered 2016-03-14: 40 ug/min via INTRAVENOUS
  Administered 2016-03-14 – 2016-03-15 (×2): 35 ug/min via INTRAVENOUS
  Filled 2016-03-13 (×5): qty 2

## 2016-03-13 MED ORDER — POTASSIUM CHLORIDE 10 MEQ/50ML IV SOLN
10.0000 meq | INTRAVENOUS | Status: AC
  Administered 2016-03-13 – 2016-03-14 (×3): 10 meq via INTRAVENOUS

## 2016-03-13 MED ORDER — VECURONIUM BROMIDE 10 MG IV SOLR
INTRAVENOUS | Status: DC | PRN
  Administered 2016-03-13: 5 mg via INTRAVENOUS

## 2016-03-13 MED ORDER — DIAZEPAM 5 MG PO TABS: 5.0000 mg | ORAL_TABLET | Freq: Once | ORAL | Status: DC

## 2016-03-13 MED ORDER — VERAPAMIL HCL 2.5 MG/ML IV SOLN
INTRAVENOUS | Status: DC | PRN
  Administered 2016-03-13: 10 mL via INTRA_ARTERIAL

## 2016-03-13 MED ORDER — ASPIRIN 81 MG PO CHEW
324.0000 mg | CHEWABLE_TABLET | Freq: Every day | ORAL | Status: DC
  Administered 2016-03-14 – 2016-03-31 (×5): 324 mg
  Filled 2016-03-13 (×7): qty 4

## 2016-03-13 MED ORDER — DEXTROSE 5 % IV SOLN
INTRAVENOUS | Status: DC | PRN
  Administered 2016-03-13: 4 ug/min via INTRAVENOUS

## 2016-03-13 MED ORDER — BISACODYL 10 MG RE SUPP: 10.0000 mg | Freq: Every day | RECTAL | Status: DC

## 2016-03-13 MED ORDER — POTASSIUM CHLORIDE 2 MEQ/ML IV SOLN
80.0000 meq | INTRAVENOUS | Status: DC
  Filled 2016-03-13: qty 40

## 2016-03-13 MED ORDER — MILRINONE LACTATE IN DEXTROSE 20-5 MG/100ML-% IV SOLN
0.3000 ug/kg/min | INTRAVENOUS | Status: DC
  Administered 2016-03-14 – 2016-03-16 (×4): 0.3 ug/kg/min via INTRAVENOUS
  Filled 2016-03-13 (×4): qty 100

## 2016-03-13 MED ORDER — LACTATED RINGERS IV SOLN: INTRAVENOUS | Status: DC

## 2016-03-13 MED ORDER — HEPARIN SODIUM (PORCINE) 1000 UNIT/ML IJ SOLN
INTRAMUSCULAR | Status: DC | PRN
  Administered 2016-03-13: 4000 [IU] via INTRAVENOUS
  Administered 2016-03-13: 3000 [IU] via INTRAVENOUS

## 2016-03-13 MED ORDER — SODIUM CHLORIDE 0.9 % IV SOLN
INTRAVENOUS | Status: DC | PRN
  Administered 2016-03-13: 10 mL/h via INTRAVENOUS

## 2016-03-13 MED ORDER — EPINEPHRINE HCL 1 MG/ML IJ SOLN
0.0000 ug/min | INTRAMUSCULAR | Status: AC
  Administered 2016-03-13: 3 ug/min via INTRAVENOUS
  Filled 2016-03-13: qty 4

## 2016-03-13 MED ORDER — PHENYLEPHRINE HCL 10 MG/ML IJ SOLN
INTRAMUSCULAR | Status: DC | PRN
  Administered 2016-03-13: 80 ug via INTRAVENOUS

## 2016-03-13 MED ORDER — SODIUM BICARBONATE 8.4 % IV SOLN
50.0000 meq | Freq: Once | INTRAVENOUS | Status: AC
  Administered 2016-03-13: 50 meq via INTRAVENOUS

## 2016-03-13 MED ORDER — ACETAMINOPHEN 650 MG RE SUPP
650.0000 mg | Freq: Once | RECTAL | Status: AC
  Administered 2016-03-13: 650 mg via RECTAL

## 2016-03-13 MED ORDER — NITROGLYCERIN 1 MG/10 ML FOR IR/CATH LAB
INTRA_ARTERIAL | Status: AC
  Filled 2016-03-13: qty 10

## 2016-03-13 MED ORDER — ACETAMINOPHEN 500 MG PO TABS
1000.0000 mg | ORAL_TABLET | Freq: Four times a day (QID) | ORAL | Status: AC
  Administered 2016-03-14 – 2016-03-18 (×16): 1000 mg via ORAL
  Filled 2016-03-13 (×17): qty 2

## 2016-03-13 MED ORDER — TRAMADOL HCL 50 MG PO TABS
50.0000 mg | ORAL_TABLET | ORAL | Status: DC | PRN
  Administered 2016-03-18 – 2016-03-31 (×3): 50 mg via ORAL
  Filled 2016-03-13 (×3): qty 1

## 2016-03-13 MED ORDER — SODIUM CHLORIDE 0.9% FLUSH
3.0000 mL | Freq: Two times a day (BID) | INTRAVENOUS | Status: DC
  Administered 2016-03-14 – 2016-03-16 (×4): 3 mL via INTRAVENOUS

## 2016-03-13 MED ORDER — PANTOPRAZOLE SODIUM 40 MG PO TBEC
40.0000 mg | DELAYED_RELEASE_TABLET | Freq: Every day | ORAL | Status: DC
  Administered 2016-03-15 – 2016-04-03 (×20): 40 mg via ORAL
  Filled 2016-03-13 (×20): qty 1

## 2016-03-13 MED ORDER — THROMBIN 5000 UNITS EX SOLR
CUTANEOUS | Status: DC | PRN
  Administered 2016-03-13: 2000 [IU] via TOPICAL

## 2016-03-13 MED ORDER — THROMBIN 5000 UNITS EX SOLR
CUTANEOUS | Status: AC
  Filled 2016-03-13: qty 5000

## 2016-03-13 MED ORDER — VANCOMYCIN HCL IN DEXTROSE 1-5 GM/200ML-% IV SOLN
1000.0000 mg | Freq: Two times a day (BID) | INTRAVENOUS | Status: AC
  Administered 2016-03-14 (×2): 1000 mg via INTRAVENOUS
  Filled 2016-03-13 (×2): qty 200

## 2016-03-13 MED ORDER — VERAPAMIL HCL 2.5 MG/ML IV SOLN
INTRAVENOUS | Status: AC
  Filled 2016-03-13: qty 2

## 2016-03-13 MED ORDER — ARTIFICIAL TEARS OP OINT
TOPICAL_OINTMENT | OPHTHALMIC | Status: DC | PRN
  Administered 2016-03-13: 1 via OPHTHALMIC

## 2016-03-13 MED ORDER — NITROGLYCERIN IN D5W 200-5 MCG/ML-% IV SOLN: 0.0000 ug/min | INTRAVENOUS | Status: DC

## 2016-03-13 MED ORDER — IOPAMIDOL (ISOVUE-370) INJECTION 76%
INTRAVENOUS | Status: AC
Start: 2016-03-13 — End: 2016-03-13
  Filled 2016-03-13: qty 125

## 2016-03-13 MED ORDER — SODIUM BICARBONATE 8.4 % IV SOLN
INTRAVENOUS | Status: DC | PRN
  Administered 2016-03-13 (×2): 50 meq via INTRAVENOUS
  Administered 2016-03-13: 25 meq via INTRAVENOUS

## 2016-03-13 MED ORDER — IOPAMIDOL (ISOVUE-370) INJECTION 76%
INTRAVENOUS | Status: DC | PRN
  Administered 2016-03-13: 85 mL via INTRAVENOUS

## 2016-03-13 MED ORDER — SODIUM CHLORIDE 0.45 % IV SOLN
INTRAVENOUS | Status: DC | PRN
  Administered 2016-03-13 – 2016-03-15 (×2): via INTRAVENOUS

## 2016-03-13 MED ORDER — DEXMEDETOMIDINE HCL IN NACL 400 MCG/100ML IV SOLN
0.0000 ug/kg/h | INTRAVENOUS | Status: DC
  Administered 2016-03-13 – 2016-03-14 (×3): 0.7 ug/kg/h via INTRAVENOUS
  Filled 2016-03-13 (×2): qty 50
  Filled 2016-03-13: qty 100
  Filled 2016-03-13 (×2): qty 50
  Filled 2016-03-13: qty 100

## 2016-03-13 MED ORDER — MIDAZOLAM HCL 2 MG/2ML IJ SOLN
INTRAMUSCULAR | Status: AC
  Filled 2016-03-13: qty 2

## 2016-03-13 MED ORDER — MIDAZOLAM HCL 5 MG/5ML IJ SOLN
INTRAMUSCULAR | Status: DC | PRN
  Administered 2016-03-13: 4 mg via INTRAVENOUS
  Administered 2016-03-13: 2 mg via INTRAVENOUS
  Administered 2016-03-13: 4 mg via INTRAVENOUS
  Administered 2016-03-13: 2 mg via INTRAVENOUS

## 2016-03-13 MED ORDER — SODIUM CHLORIDE 0.9 % IV SOLN
INTRAVENOUS | Status: AC
  Administered 2016-03-13: 69.8 mL/h via INTRAVENOUS
  Filled 2016-03-13: qty 40

## 2016-03-13 MED ORDER — SODIUM CHLORIDE 0.9 % IV SOLN
250.0000 mL | INTRAVENOUS | Status: DC
  Administered 2016-03-14 – 2016-03-22 (×3): 250 mL via INTRAVENOUS

## 2016-03-13 MED ORDER — ROCURONIUM BROMIDE 100 MG/10ML IV SOLN: INTRAVENOUS | Status: DC | PRN

## 2016-03-13 MED ORDER — FENTANYL CITRATE (PF) 250 MCG/5ML IJ SOLN
INTRAMUSCULAR | Status: DC | PRN
  Administered 2016-03-13: 100 ug via INTRAVENOUS
  Administered 2016-03-13 (×2): 150 ug via INTRAVENOUS
  Administered 2016-03-13: 100 ug via INTRAVENOUS
  Administered 2016-03-13: 250 ug via INTRAVENOUS

## 2016-03-13 MED ORDER — METOPROLOL TARTRATE 12.5 MG HALF TABLET
12.5000 mg | ORAL_TABLET | Freq: Two times a day (BID) | ORAL | Status: DC
  Administered 2016-03-15 – 2016-03-16 (×3): 12.5 mg via ORAL
  Filled 2016-03-13 (×3): qty 1

## 2016-03-13 MED ORDER — TEMAZEPAM 15 MG PO CAPS: 15.0000 mg | ORAL_CAPSULE | Freq: Once | ORAL | Status: DC | PRN

## 2016-03-13 MED ORDER — CHLORHEXIDINE GLUCONATE 0.12 % MT SOLN
15.0000 mL | OROMUCOSAL | Status: AC
  Administered 2016-03-13: 15 mL via OROMUCOSAL

## 2016-03-13 MED ORDER — MILRINONE LACTATE IN DEXTROSE 20-5 MG/100ML-% IV SOLN
0.1250 ug/kg/min | INTRAVENOUS | Status: AC
  Administered 2016-03-13: .25 ug/kg/min via INTRAVENOUS
  Filled 2016-03-13: qty 100

## 2016-03-13 MED ORDER — METOPROLOL TARTRATE 5 MG/5ML IV SOLN: 2.5000 mg | INTRAVENOUS | Status: DC | PRN

## 2016-03-13 MED ORDER — VANCOMYCIN HCL 10 G IV SOLR
1250.0000 mg | INTRAVENOUS | Status: AC
  Administered 2016-03-13: 1250 mg via INTRAVENOUS
  Filled 2016-03-13: qty 1250

## 2016-03-13 MED ORDER — HEPARIN (PORCINE) IN NACL 2-0.9 UNIT/ML-% IJ SOLN
INTRAMUSCULAR | Status: AC
  Filled 2016-03-13: qty 1000

## 2016-03-13 MED ORDER — SODIUM CHLORIDE 0.9% FLUSH: 3.0000 mL | INTRAVENOUS | Status: DC | PRN

## 2016-03-13 MED ORDER — FAMOTIDINE IN NACL 20-0.9 MG/50ML-% IV SOLN
20.0000 mg | Freq: Two times a day (BID) | INTRAVENOUS | Status: AC
  Administered 2016-03-13 – 2016-03-14 (×2): 20 mg via INTRAVENOUS
  Filled 2016-03-13 (×2): qty 50

## 2016-03-13 MED ORDER — ONDANSETRON HCL 4 MG/2ML IJ SOLN: 4.0000 mg | Freq: Four times a day (QID) | INTRAMUSCULAR | Status: DC | PRN

## 2016-03-13 MED ORDER — ASPIRIN EC 325 MG PO TBEC
325.0000 mg | DELAYED_RELEASE_TABLET | Freq: Every day | ORAL | Status: DC
  Administered 2016-03-15 – 2016-04-02 (×15): 325 mg via ORAL
  Filled 2016-03-13 (×19): qty 1

## 2016-03-13 MED ORDER — CHLORHEXIDINE GLUCONATE CLOTH 2 % EX PADS: 6.0000 | MEDICATED_PAD | Freq: Once | CUTANEOUS | Status: DC

## 2016-03-13 MED ORDER — NOREPINEPHRINE BITARTRATE 1 MG/ML IV SOLN
INTRAVENOUS | Status: DC | PRN
  Administered 2016-03-13: 5 ug/min via INTRAVENOUS

## 2016-03-13 MED ORDER — METOPROLOL TARTRATE 25 MG/10 ML ORAL SUSPENSION
12.5000 mg | Freq: Two times a day (BID) | ORAL | Status: DC
  Administered 2016-03-14: 12.5 mg
  Filled 2016-03-13: qty 5

## 2016-03-13 MED ORDER — LIDOCAINE HCL (PF) 1 % IJ SOLN
INTRAMUSCULAR | Status: AC
  Filled 2016-03-13: qty 30

## 2016-03-13 MED ORDER — MOMETASONE FURO-FORMOTEROL FUM 100-5 MCG/ACT IN AERO
2.0000 | INHALATION_SPRAY | Freq: Two times a day (BID) | RESPIRATORY_TRACT | Status: DC
  Filled 2016-03-13: qty 8.8

## 2016-03-13 MED ORDER — ALBUMIN HUMAN 5 % IV SOLN: 250.0000 mL | INTRAVENOUS | Status: AC | PRN

## 2016-03-13 MED ORDER — SODIUM CHLORIDE 0.9 % IV SOLN
INTRAVENOUS | Status: DC
  Filled 2016-03-13: qty 40

## 2016-03-13 MED ORDER — SODIUM CHLORIDE 0.9 % IJ SOLN
OROMUCOSAL | Status: DC | PRN
  Administered 2016-03-13 (×3): 4 mL via TOPICAL

## 2016-03-13 MED ORDER — PLASMA-LYTE 148 IV SOLN
INTRAVENOUS | Status: AC
  Administered 2016-03-13: 500 mL
  Filled 2016-03-13: qty 2.5

## 2016-03-13 MED ORDER — LACTATED RINGERS IV SOLN
INTRAVENOUS | Status: DC | PRN
  Administered 2016-03-13 (×3): via INTRAVENOUS

## 2016-03-13 MED ORDER — PHENYLEPHRINE HCL 10 MG/ML IJ SOLN
30.0000 ug/min | INTRAVENOUS | Status: DC
  Filled 2016-03-13: qty 2

## 2016-03-13 MED ORDER — SODIUM CHLORIDE 0.9 % IV SOLN
INTRAVENOUS | Status: DC | PRN
  Administered 2016-03-13: 13:00:00 via INTRAVENOUS

## 2016-03-13 MED ORDER — DOCUSATE SODIUM 100 MG PO CAPS
200.0000 mg | ORAL_CAPSULE | Freq: Every day | ORAL | Status: DC
Start: 2016-03-14 — End: 2016-04-03
  Administered 2016-03-15 – 2016-04-01 (×17): 200 mg via ORAL
  Filled 2016-03-13 (×19): qty 2

## 2016-03-13 MED ORDER — VANCOMYCIN HCL IN DEXTROSE 1-5 GM/200ML-% IV SOLN
1000.0000 mg | Freq: Once | INTRAVENOUS | Status: DC
  Filled 2016-03-13: qty 200

## 2016-03-13 MED ORDER — FENTANYL CITRATE (PF) 100 MCG/2ML IJ SOLN
25.0000 ug | INTRAMUSCULAR | Status: DC | PRN
  Administered 2016-03-13 – 2016-03-14 (×2): 50 ug via INTRAVENOUS
  Filled 2016-03-13 (×2): qty 2

## 2016-03-13 MED ORDER — SUCCINYLCHOLINE CHLORIDE 20 MG/ML IJ SOLN
INTRAMUSCULAR | Status: DC | PRN
  Administered 2016-03-13: 60 mg via INTRAVENOUS

## 2016-03-13 MED ORDER — NICOTINE 14 MG/24HR TD PT24
14.0000 mg | MEDICATED_PATCH | Freq: Every day | TRANSDERMAL | Status: DC
  Administered 2016-03-14 – 2016-04-02 (×21): 14 mg via TRANSDERMAL
  Filled 2016-03-13 (×21): qty 1

## 2016-03-13 MED ORDER — NOREPINEPHRINE BITARTRATE 1 MG/ML IV SOLN
INTRAVENOUS | Status: AC
  Filled 2016-03-13: qty 4

## 2016-03-13 MED ORDER — DEXTROSE 5 % IV SOLN
1.5000 g | Freq: Two times a day (BID) | INTRAVENOUS | Status: AC
  Administered 2016-03-14 – 2016-03-15 (×4): 1.5 g via INTRAVENOUS
  Filled 2016-03-13 (×4): qty 1.5

## 2016-03-13 MED ORDER — OXYCODONE HCL 5 MG PO TABS
5.0000 mg | ORAL_TABLET | ORAL | Status: DC | PRN
  Administered 2016-03-14 – 2016-03-16 (×7): 10 mg via ORAL
  Administered 2016-03-16: 5 mg via ORAL
  Administered 2016-03-16: 10 mg via ORAL
  Administered 2016-03-16: 5 mg via ORAL
  Administered 2016-03-17 – 2016-03-21 (×9): 10 mg via ORAL
  Administered 2016-03-23: 5 mg via ORAL
  Administered 2016-03-23 – 2016-03-25 (×4): 10 mg via ORAL
  Administered 2016-03-26 – 2016-03-28 (×5): 5 mg via ORAL
  Administered 2016-03-30: 10 mg via ORAL
  Administered 2016-03-31 – 2016-04-02 (×6): 5 mg via ORAL
  Filled 2016-03-13 (×2): qty 1
  Filled 2016-03-13 (×2): qty 2
  Filled 2016-03-13: qty 1
  Filled 2016-03-13 (×2): qty 2
  Filled 2016-03-13: qty 1
  Filled 2016-03-13: qty 2
  Filled 2016-03-13: qty 1
  Filled 2016-03-13: qty 2
  Filled 2016-03-13: qty 1
  Filled 2016-03-13 (×7): qty 2
  Filled 2016-03-13: qty 1
  Filled 2016-03-13 (×3): qty 2
  Filled 2016-03-13: qty 1
  Filled 2016-03-13 (×3): qty 2
  Filled 2016-03-13 (×2): qty 1
  Filled 2016-03-13 (×2): qty 2
  Filled 2016-03-13: qty 1
  Filled 2016-03-13: qty 2
  Filled 2016-03-13: qty 1
  Filled 2016-03-13: qty 2
  Filled 2016-03-13: qty 1

## 2016-03-13 MED ORDER — MIDAZOLAM HCL 2 MG/2ML IJ SOLN
INTRAMUSCULAR | Status: DC | PRN
  Administered 2016-03-13: 1 mg via INTRAVENOUS

## 2016-03-13 MED ORDER — NITROGLYCERIN IN D5W 200-5 MCG/ML-% IV SOLN
2.0000 ug/min | INTRAVENOUS | Status: AC
  Administered 2016-03-13: 5 ug/min via INTRAVENOUS
  Filled 2016-03-13: qty 250

## 2016-03-13 MED ORDER — INSULIN REGULAR BOLUS VIA INFUSION
0.0000 [IU] | Freq: Three times a day (TID) | INTRAVENOUS | Status: DC
  Filled 2016-03-13: qty 10

## 2016-03-13 MED ORDER — ROCURONIUM BROMIDE 10 MG/ML (PF) SYRINGE
PREFILLED_SYRINGE | INTRAVENOUS | Status: DC | PRN
  Administered 2016-03-13: 70 mg via INTRAVENOUS
  Administered 2016-03-13: 30 mg via INTRAVENOUS

## 2016-03-13 MED ORDER — CHLORHEXIDINE GLUCONATE 0.12 % MT SOLN
15.0000 mL | Freq: Once | OROMUCOSAL | Status: DC
Start: 2016-03-14 — End: 2016-03-13

## 2016-03-13 MED ORDER — ASPIRIN 81 MG PO CHEW: 324.0000 mg | CHEWABLE_TABLET | ORAL | Status: DC

## 2016-03-13 MED ORDER — FENTANYL CITRATE (PF) 250 MCG/5ML IJ SOLN
INTRAMUSCULAR | Status: AC
  Filled 2016-03-13: qty 5

## 2016-03-13 MED ORDER — LACTATED RINGERS IV SOLN: 500.0000 mL | Freq: Once | INTRAVENOUS | Status: DC | PRN

## 2016-03-13 MED ORDER — HEPARIN SODIUM (PORCINE) 1000 UNIT/ML IJ SOLN
INTRAMUSCULAR | Status: DC | PRN
  Administered 2016-03-13: 30000 [IU] via INTRAVENOUS

## 2016-03-13 MED ORDER — SODIUM CHLORIDE 0.9 % IV SOLN
INTRAVENOUS | Status: AC
  Administered 2016-03-13: 1 [IU]/h via INTRAVENOUS
  Filled 2016-03-13: qty 2.5

## 2016-03-13 MED ORDER — DEXMEDETOMIDINE HCL IN NACL 400 MCG/100ML IV SOLN
0.1000 ug/kg/h | INTRAVENOUS | Status: AC
  Administered 2016-03-13: .2 ug/kg/h via INTRAVENOUS
  Filled 2016-03-13: qty 100

## 2016-03-13 MED ORDER — ROSUVASTATIN CALCIUM 10 MG PO TABS
40.0000 mg | ORAL_TABLET | Freq: Every day | ORAL | Status: DC
  Administered 2016-03-14 – 2016-04-02 (×20): 40 mg via ORAL
  Filled 2016-03-13: qty 2
  Filled 2016-03-13: qty 4
  Filled 2016-03-13: qty 2
  Filled 2016-03-13: qty 1
  Filled 2016-03-13: qty 2
  Filled 2016-03-13: qty 1
  Filled 2016-03-13 (×6): qty 2
  Filled 2016-03-13: qty 1
  Filled 2016-03-13 (×2): qty 2
  Filled 2016-03-13 (×2): qty 1
  Filled 2016-03-13: qty 4
  Filled 2016-03-13: qty 2
  Filled 2016-03-13: qty 1
  Filled 2016-03-13: qty 2
  Filled 2016-03-13: qty 1
  Filled 2016-03-13 (×2): qty 2
  Filled 2016-03-13: qty 4
  Filled 2016-03-13: qty 2
  Filled 2016-03-13: qty 1
  Filled 2016-03-13: qty 2

## 2016-03-13 MED ORDER — POTASSIUM CHLORIDE 10 MEQ/50ML IV SOLN
10.0000 meq | INTRAVENOUS | Status: AC
  Administered 2016-03-13 (×2): 10 meq via INTRAVENOUS

## 2016-03-13 MED ORDER — VASOPRESSIN 20 UNIT/ML IV SOLN
0.0100 [IU]/min | INTRAVENOUS | Status: DC
  Filled 2016-03-13: qty 2

## 2016-03-13 MED ORDER — MIDAZOLAM HCL 10 MG/2ML IJ SOLN
INTRAMUSCULAR | Status: AC
  Filled 2016-03-13: qty 2

## 2016-03-13 MED ORDER — MAGNESIUM SULFATE 50 % IJ SOLN
40.0000 meq | INTRAMUSCULAR | Status: DC
  Filled 2016-03-13: qty 10

## 2016-03-13 MED ORDER — FENTANYL CITRATE (PF) 250 MCG/5ML IJ SOLN
INTRAMUSCULAR | Status: AC
  Filled 2016-03-13: qty 10

## 2016-03-13 MED ORDER — ORAL CARE MOUTH RINSE
15.0000 mL | OROMUCOSAL | Status: DC
  Administered 2016-03-14 (×8): 15 mL via OROMUCOSAL

## 2016-03-13 MED ORDER — SODIUM CHLORIDE 0.9 % IV SOLN
INTRAVENOUS | Status: DC
  Administered 2016-03-13 – 2016-03-14 (×2): via INTRAVENOUS

## 2016-03-13 MED ORDER — PROTAMINE SULFATE 10 MG/ML IV SOLN
INTRAVENOUS | Status: DC | PRN
  Administered 2016-03-13: 2 mg via INTRAVENOUS
  Administered 2016-03-13: 28 mg via INTRAVENOUS

## 2016-03-13 MED ORDER — FENTANYL CITRATE (PF) 100 MCG/2ML IJ SOLN
INTRAMUSCULAR | Status: DC | PRN
  Administered 2016-03-13 (×2): 25 ug via INTRAVENOUS

## 2016-03-13 MED ORDER — METOPROLOL TARTRATE 12.5 MG HALF TABLET: 12.5000 mg | ORAL_TABLET | Freq: Once | ORAL | Status: DC

## 2016-03-13 MED ORDER — 0.9 % SODIUM CHLORIDE (POUR BTL) OPTIME
TOPICAL | Status: DC | PRN
  Administered 2016-03-13: 6000 mL

## 2016-03-13 MED ORDER — PROPOFOL 10 MG/ML IV BOLUS
INTRAVENOUS | Status: DC | PRN
  Administered 2016-03-13: 30 mg via INTRAVENOUS

## 2016-03-13 MED ORDER — MIDAZOLAM HCL 2 MG/2ML IJ SOLN
2.0000 mg | INTRAMUSCULAR | Status: DC | PRN
  Administered 2016-03-13 – 2016-03-14 (×6): 2 mg via INTRAVENOUS
  Filled 2016-03-13 (×6): qty 2

## 2016-03-13 MED ORDER — PROPOFOL 10 MG/ML IV BOLUS
INTRAVENOUS | Status: AC
  Filled 2016-03-13: qty 20

## 2016-03-13 MED ORDER — ACETAMINOPHEN 160 MG/5ML PO SOLN: 650.0000 mg | Freq: Once | ORAL | Status: AC

## 2016-03-13 SURGICAL SUPPLY — 18 items
BALLN LINEAR 7.5FR IABP 40CC (BALLOONS) ×3
BALLOON LINEAR 7.5FR IABP 40CC (BALLOONS) IMPLANT
CATH INFINITI 5 FR JL3.5 (CATHETERS) ×2 IMPLANT
CATH INFINITI 5FR ANG PIGTAIL (CATHETERS) ×2 IMPLANT
CATH INFINITI JR4 5F (CATHETERS) ×2 IMPLANT
CATH SWAN GANZ 7F STRAIGHT (CATHETERS) ×2 IMPLANT
DEVICE SECURE STATLOCK IABP (MISCELLANEOUS) ×4 IMPLANT
ELECT DEFIB PAD ADLT CADENCE (PAD) ×2 IMPLANT
GLIDESHEATH SLEND SS 6F .021 (SHEATH) ×2 IMPLANT
KIT ENCORE 26 ADVANTAGE (KITS) ×2 IMPLANT
KIT HEART LEFT (KITS) ×3 IMPLANT
PACK CARDIAC CATHETERIZATION (CUSTOM PROCEDURE TRAY) ×3 IMPLANT
SHEATH PINNACLE 7F 10CM (SHEATH) ×2 IMPLANT
SYR MEDRAD MARK V 150ML (SYRINGE) ×3 IMPLANT
TRANSDUCER W/STOPCOCK (MISCELLANEOUS) ×3 IMPLANT
TUBING CIL FLEX 10 FLL-RA (TUBING) ×3 IMPLANT
WIRE HI TORQ VERSACORE-J 145CM (WIRE) ×2 IMPLANT
WIRE SAFE-T 1.5MM-J .035X260CM (WIRE) ×2 IMPLANT

## 2016-03-13 SURGICAL SUPPLY — 125 items
ADAPTER CARDIO PERF ANTE/RETRO (ADAPTER) ×3 IMPLANT
ADH SKN CLS APL DERMABOND .7 (GAUZE/BANDAGES/DRESSINGS) ×2
ADH SRG 12 PREFL SYR 3 SPRDR (MISCELLANEOUS) ×2
ADPR PRFSN 84XANTGRD RTRGD (ADAPTER) ×2
BAG DECANTER FOR FLEXI CONT (MISCELLANEOUS) ×3 IMPLANT
BANDAGE ACE 4X5 VEL STRL LF (GAUZE/BANDAGES/DRESSINGS) ×3 IMPLANT
BANDAGE ACE 6X5 VEL STRL LF (GAUZE/BANDAGES/DRESSINGS) ×3 IMPLANT
BASKET HEART (ORDER IN 25'S) (MISCELLANEOUS) ×1
BASKET HEART (ORDER IN 25S) (MISCELLANEOUS) ×2 IMPLANT
BLADE STERNUM SYSTEM 6 (BLADE) ×3 IMPLANT
BLADE SURG 11 STRL SS (BLADE) ×1 IMPLANT
BLADE SURG 12 STRL SS (BLADE) ×3 IMPLANT
BLADE SURG ROTATE 9660 (MISCELLANEOUS) IMPLANT
BNDG GAUZE ELAST 4 BULKY (GAUZE/BANDAGES/DRESSINGS) ×3 IMPLANT
CANISTER SUCTION 2500CC (MISCELLANEOUS) ×3 IMPLANT
CANN PRFSN 3/8XRT ANG TPR 14 (MISCELLANEOUS) ×2
CANNULA GUNDRY RCSP 15FR (MISCELLANEOUS) ×3 IMPLANT
CANNULA PRFSN 3/8XRT ANG TPR14 (MISCELLANEOUS) IMPLANT
CANNULA VEN MTL TIP RT (MISCELLANEOUS) ×3
CANNULA VRC MALB SNGL STG 28FR (MISCELLANEOUS) IMPLANT
CATH CPB KIT VANTRIGT (MISCELLANEOUS) ×3 IMPLANT
CATH HEART VENT LEFT (CATHETERS) IMPLANT
CATH ROBINSON RED A/P 18FR (CATHETERS) ×10 IMPLANT
CATH THORACIC 36FR RT ANG (CATHETERS) ×4 IMPLANT
CONN 1/2X1/2X1/2  BEN (MISCELLANEOUS) ×1
CONN 1/2X1/2X1/2 BEN (MISCELLANEOUS) IMPLANT
CONN 3/8X1/2 ST GISH (MISCELLANEOUS) ×2 IMPLANT
CONT SPEC 4OZ CLIKSEAL STRL BL (MISCELLANEOUS) ×1 IMPLANT
COUNTER NEEDLE 20 DBL MAG RED (NEEDLE) ×1 IMPLANT
CRADLE DONUT ADULT HEAD (MISCELLANEOUS) ×3 IMPLANT
DERMABOND ADVANCED (GAUZE/BANDAGES/DRESSINGS) ×1
DERMABOND ADVANCED .7 DNX12 (GAUZE/BANDAGES/DRESSINGS) IMPLANT
DRAIN CHANNEL 32F RND 10.7 FF (WOUND CARE) ×3 IMPLANT
DRAPE CARDIOVASCULAR INCISE (DRAPES) ×3
DRAPE SLUSH/WARMER DISC (DRAPES) ×3 IMPLANT
DRAPE SRG 135X102X78XABS (DRAPES) ×2 IMPLANT
DRSG AQUACEL AG ADV 3.5X14 (GAUZE/BANDAGES/DRESSINGS) ×3 IMPLANT
ELECT BLADE 4.0 EZ CLEAN MEGAD (MISCELLANEOUS) ×3
ELECT BLADE 6.5 EXT (BLADE) ×3 IMPLANT
ELECT CAUTERY BLADE 6.4 (BLADE) ×3 IMPLANT
ELECT REM PT RETURN 9FT ADLT (ELECTROSURGICAL) ×6
ELECTRODE BLDE 4.0 EZ CLN MEGD (MISCELLANEOUS) ×2 IMPLANT
ELECTRODE REM PT RTRN 9FT ADLT (ELECTROSURGICAL) ×4 IMPLANT
FELT TEFLON 1X6 (MISCELLANEOUS) ×8 IMPLANT
FELT TEFLON 6X6 (MISCELLANEOUS) ×1 IMPLANT
GAUZE SPONGE 4X4 12PLY STRL (GAUZE/BANDAGES/DRESSINGS) ×6 IMPLANT
GLOVE BIO SURGEON STRL SZ 6.5 (GLOVE) ×6 IMPLANT
GLOVE BIO SURGEON STRL SZ7.5 (GLOVE) ×9 IMPLANT
GLOVE BIOGEL PI IND STRL 6.5 (GLOVE) IMPLANT
GLOVE BIOGEL PI INDICATOR 6.5 (GLOVE) ×1
GOWN STRL REUS W/ TWL LRG LVL3 (GOWN DISPOSABLE) ×8 IMPLANT
GOWN STRL REUS W/TWL LRG LVL3 (GOWN DISPOSABLE) ×30
HEMOSTAT POWDER SURGIFOAM 1G (HEMOSTASIS) ×9 IMPLANT
HEMOSTAT SURGICEL 2X14 (HEMOSTASIS) ×3 IMPLANT
INSERT FOGARTY XLG (MISCELLANEOUS) IMPLANT
KIT BASIN OR (CUSTOM PROCEDURE TRAY) ×3 IMPLANT
KIT ROOM TURNOVER OR (KITS) ×3 IMPLANT
KIT SUCTION CATH 14FR (SUCTIONS) ×3 IMPLANT
KIT VASOVIEW 6 PRO VH 2400 (KITS) ×1 IMPLANT
LEAD PACING MYOCARDI (MISCELLANEOUS) ×3 IMPLANT
LINE VENT (MISCELLANEOUS) ×1 IMPLANT
LOOP VESSEL SUPERMAXI WHITE (MISCELLANEOUS) ×1 IMPLANT
MARKER GRAFT CORONARY BYPASS (MISCELLANEOUS) ×9 IMPLANT
NDL SUT 1 .5 CRC FRENCH EYE (NEEDLE) IMPLANT
NEEDLE FRENCH EYE (NEEDLE) ×3
NS IRRIG 1000ML POUR BTL (IV SOLUTION) ×15 IMPLANT
PACK OPEN HEART (CUSTOM PROCEDURE TRAY) ×3 IMPLANT
PAD ARMBOARD 7.5X6 YLW CONV (MISCELLANEOUS) ×6 IMPLANT
PAD ELECT DEFIB RADIOL ZOLL (MISCELLANEOUS) ×3 IMPLANT
PENCIL BUTTON HOLSTER BLD 10FT (ELECTRODE) ×3 IMPLANT
PUNCH AORTIC ROTATE  4.5MM 8IN (MISCELLANEOUS) ×1 IMPLANT
PUNCH AORTIC ROTATE 4.0MM (MISCELLANEOUS) IMPLANT
PUNCH AORTIC ROTATE 4.5MM 8IN (MISCELLANEOUS) IMPLANT
PUNCH AORTIC ROTATE 5MM 8IN (MISCELLANEOUS) IMPLANT
SET CARDIOPLEGIA MPS 5001102 (MISCELLANEOUS) ×1 IMPLANT
SPONGE LAP 18X18 X RAY DECT (DISPOSABLE) ×1 IMPLANT
SURGIFLO W/THROMBIN 8M KIT (HEMOSTASIS) ×3 IMPLANT
SUT BONE WAX W31G (SUTURE) ×3 IMPLANT
SUT ETHIBOND 2 0 SH (SUTURE) ×3
SUT ETHIBOND 2 0 SH 36X2 (SUTURE) IMPLANT
SUT ETHIBOND 2 OS 4 DA (SUTURE) ×1 IMPLANT
SUT ETHIBOND 5 LR DA (SUTURE) ×1 IMPLANT
SUT ETHIBOND NAB MH 2-0 36IN (SUTURE) ×36 IMPLANT
SUT MNCRL AB 4-0 PS2 18 (SUTURE) ×1 IMPLANT
SUT PROLENE 1 XLH (SUTURE) ×4 IMPLANT
SUT PROLENE 2 0 SH DA (SUTURE) ×2 IMPLANT
SUT PROLENE 3 0 RB 1 (SUTURE) ×1 IMPLANT
SUT PROLENE 3 0 SH DA (SUTURE) ×1 IMPLANT
SUT PROLENE 3 0 SH1 36 (SUTURE) IMPLANT
SUT PROLENE 4 0 RB 1 (SUTURE) ×6
SUT PROLENE 4 0 SH DA (SUTURE) ×11 IMPLANT
SUT PROLENE 4-0 RB1 .5 CRCL 36 (SUTURE) ×2 IMPLANT
SUT PROLENE 5 0 C 1 36 (SUTURE) IMPLANT
SUT PROLENE 6 0 C 1 30 (SUTURE) IMPLANT
SUT PROLENE 6 0 CC (SUTURE) ×10 IMPLANT
SUT PROLENE 8 0 BV175 6 (SUTURE) ×2 IMPLANT
SUT PROLENE BLUE 7 0 (SUTURE) ×4 IMPLANT
SUT SILK  1 MH (SUTURE)
SUT SILK 1 MH (SUTURE) IMPLANT
SUT SILK 2 0 SH CR/8 (SUTURE) ×1 IMPLANT
SUT SILK 3 0 SH CR/8 (SUTURE) IMPLANT
SUT STEEL 6MS V (SUTURE) ×8 IMPLANT
SUT STEEL SZ 6 DBL 3X14 BALL (SUTURE) ×4 IMPLANT
SUT VIC AB 1 CTX 36 (SUTURE) ×6
SUT VIC AB 1 CTX36XBRD ANBCTR (SUTURE) ×4 IMPLANT
SUT VIC AB 2-0 CT1 27 (SUTURE)
SUT VIC AB 2-0 CT1 36 (SUTURE) ×1 IMPLANT
SUT VIC AB 2-0 CT1 TAPERPNT 27 (SUTURE) IMPLANT
SUT VIC AB 2-0 CTX 27 (SUTURE) IMPLANT
SUT VIC AB 3-0 X1 27 (SUTURE) IMPLANT
SUTURE E-PAK OPEN HEART (SUTURE) ×3 IMPLANT
SYR 10ML KIT SKIN ADHESIVE (MISCELLANEOUS) ×1 IMPLANT
SYSTEM SAHARA CHEST DRAIN ATS (WOUND CARE) ×3 IMPLANT
TAPE CLOTH SURG 4X10 WHT LF (GAUZE/BANDAGES/DRESSINGS) ×1 IMPLANT
TAPE PAPER 2X10 WHT MICROPORE (GAUZE/BANDAGES/DRESSINGS) ×1 IMPLANT
TOWEL OR 17X24 6PK STRL BLUE (TOWEL DISPOSABLE) ×6 IMPLANT
TOWEL OR 17X26 10 PK STRL BLUE (TOWEL DISPOSABLE) ×6 IMPLANT
TRAY FOLEY IC TEMP SENS 16FR (CATHETERS) ×3 IMPLANT
TUBE CONNECTING 12X1/4 (SUCTIONS) ×1 IMPLANT
TUBING INSUFFLATION (TUBING) ×3 IMPLANT
UNDERPAD 30X30 (UNDERPADS AND DIAPERS) ×3 IMPLANT
VENT LEFT HEART 12002 (CATHETERS) ×3
VRC MALLEABLE SINGLE STG 28FR (MISCELLANEOUS) ×3
WATER STERILE IRR 1000ML POUR (IV SOLUTION) ×6 IMPLANT
YANKAUER SUCT BULB TIP NO VENT (SUCTIONS) ×1 IMPLANT

## 2016-03-13 NOTE — Anesthesia Procedure Notes (Signed)
Central Venous Catheter Insertion Performed by: anesthesiologist Patient location: OR. Preanesthetic checklist: patient identified, IV checked, site marked, risks and benefits discussed, surgical consent, monitors and equipment checked, pre-op evaluation, timeout performed and anesthesia consent Position: supine Landmarks identified and Seldinger technique used Catheter size: 9 Fr Central line was placed.MAC introducer Procedure performed using ultrasound guided technique. Attempts: 1 Following insertion, line sutured and dressing applied. Post procedure assessment: blood return through all ports, free fluid flow and no air. Patient tolerated the procedure well with no immediate complications.

## 2016-03-13 NOTE — Anesthesia Preprocedure Evaluation (Addendum)
Anesthesia Evaluation  Patient identified by MRN, date of birth, ID band Patient awake    Reviewed: Allergy & Precautions, NPO status , Patient's Chart, lab work & pertinent test results  History of Anesthesia Complications (+) history of anesthetic complications  Airway Mallampati: II  TM Distance: >3 FB Neck ROM: Full    Dental  (+) Dental Advisory Given   Pulmonary Current Smoker,    breath sounds clear to auscultation       Cardiovascular hypertension, + CAD and + Past MI   Rhythm:Regular Rate:Tachycardia  vsd   Neuro/Psych    GI/Hepatic GERD  ,  Endo/Other  diabetes, Type 2  Renal/GU Renal InsufficiencyRenal disease     Musculoskeletal   Abdominal   Peds  Hematology   Anesthesia Other Findings Balloon pump  Reproductive/Obstetrics                            Anesthesia Physical Anesthesia Plan  ASA: V and emergent  Anesthesia Plan: General   Post-op Pain Management:    Induction: Intravenous and Rapid sequence  Airway Management Planned: Oral ETT  Additional Equipment: Arterial line, TEE, CVP, PA Cath and Ultrasound Guidance Line Placement  Intra-op Plan:   Post-operative Plan: Post-operative intubation/ventilation  Informed Consent: I have reviewed the patients History and Physical, chart, labs and discussed the procedure including the risks, benefits and alternatives for the proposed anesthesia with the patient or authorized representative who has indicated his/her understanding and acceptance.   Dental advisory given  Plan Discussed with: CRNA and Surgeon  Anesthesia Plan Comments:         Anesthesia Quick Evaluation

## 2016-03-13 NOTE — Anesthesia Procedure Notes (Deleted)
Procedures

## 2016-03-13 NOTE — Consult Note (Signed)
[address]       [place] [address]             [phone]        Quirino Hoheisel Stanton Medical Record [id number] Date of Birth: [birth year]  Referring:M Burt Knack MD Primary Care: Philis Fendt, MD  Chief Complaint:   Right shoulder pain, shortness of breath  History of Present Illness:    Patient examined, coronary angiogram images personally reviewed and discussed in the cath lab with the patient's cardiologist Dr. Burt Knack. Transthoracic echocardiogram personally reviewed and images counseled with patient and family   65 year old Caucasian male diabetic smoker with severe shoulder pain 4 days associated with shortness of breath. 2 weeks ago the patient was evaluated by podiatry for diabetic ulcer on his right great toe. He was noted to have no palpable pedal pulses and was referred to Dr. Sophronia Simas for evaluation of his peripheral vascular circulation. ABIs were abnormal with evidence of at least 75% stenosis of the right SFA. Left pedal ABIs were 0.8. The patient presented for evaluation at Dr. Jacklynn Ganong clinic he is found to be tachycardic, blood pressure was not recorded. A 12 EKG at that time showed acute D MI and [facility]. Cardiac attestation demonstrated a large dominant right coronary with total occlusion just distal to the RV marginal branch. LAD had mild-moderate disease and circumflex had moderate stenosis. There is evidence of VSD with contrast entering the right ventricle with a LV gram. Echocardiogram was performed showing mild MR and TR and a posterior VSD. The patient required norepinephrine drip for shock in the cath lab and  a balloon pump was placed. CT surgical consultation was requested.    Current Activity/ Functional Status: The patient ihas been disabled 20 years. He lives with his daughter Con Memos    Zubrod Score: At the time of surgery this patient's most appropriate activity status/level  should be described as: []     0    Normal activity, no symptoms []     1    Restricted in physical strenuous activity but ambulatory, able to do out light work []     2    Ambulatory and capable of self care, unable to do work activities, up and about                 more than 50%  Of the time                            [x]     3    Only limited self care, in bed greater than 50% of waking hours []     4    Completely disabled, no self care, confined to bed or chair []     5    Moribund  Past Medical History:  Diagnosis Date  . Diabetes mellitus without complication ([place])   . Hyperlipidemia   . Hypertension     History reviewed. No pertinent surgical history.  History  Smoking Status  . Current Every Day Smoker  . Packs/day: 0.50  . Types: Cigarettes  Smokeless Tobacco  . Not on file    History  Alcohol use Not on file  minimal  Social History   Social History  . Marital status: Married    Spouse name: Lavell Luster  . Number of children: N/A  . Years of education: N/A   Occupational History  .  Not on file.   Social History Main Topics  . Smoking status: Current Every Day Smoker    Packs/day: 0.50    Types: Cigarettes  . Smokeless tobacco: Not on file  . Alcohol use Not on file  . Drug use: Unknown  . Sexual activity: Not on file   Other Topics Concern  . Not on file   Social History Narrative  . No narrative on file    Allergies  Allergen Reactions  . Morphine And Related Other (See Comments)    Pt said it was too much     Current Facility-Administered Medications  Medication Dose Route Frequency Provider Last Rate Last Dose  . 0.9 %  sodium chloride infusion    Continuous PRN Sherren Mocha, MD 74 mL/hr at 03/13/16 1142 74 mL/hr at 03/13/16 1142  . 0.9 %  sodium chloride infusion    Continuous PRN Sherren Mocha, MD 10 mL/hr at 03/13/16 1221 10 mL/hr at 03/13/16 1221  . [START ON 03/14/2016] aminocaproic acid (AMICAR) 10 g in sodium chloride 0.9 % 100 mL  infusion   Intravenous To OR Ivin Poot, MD      . bisacodyl (DULCOLAX) EC tablet 5 mg  5 mg Oral Once Ivin Poot, MD      . Derrill Memo ON 03/14/2016] cefUROXime (ZINACEF) 1.5 g in dextrose 5 % 50 mL IVPB  1.5 g Intravenous To OR Ivin Poot, MD      . Derrill Memo ON 03/14/2016] cefUROXime (ZINACEF) 750 mg in dextrose 5 % 50 mL IVPB  750 mg Intravenous To OR Ivin Poot, MD      . Derrill Memo ON 03/14/2016] chlorhexidine (PERIDEX) 0.12 % solution 15 mL  15 mL Mouth/Throat Once Ivin Poot, MD      . Chlorhexidine Gluconate Cloth 2 % PADS 6 each  6 each Topical Once Ivin Poot, MD      . Derrill Memo ON 03/14/2016] dexmedetomidine (PRECEDEX) 400 MCG/100ML (4 mcg/mL) infusion  0.1-0.7 mcg/kg/hr Intravenous To OR Ivin Poot, MD      . diazepam (VALIUM) tablet 5 mg  5 mg Oral Once Ivin Poot, MD      . Derrill Memo ON 03/14/2016] DOPamine (INTROPIN) 800 mg in dextrose 5 % 250 mL (3.2 mg/mL) infusion  0-10 mcg/kg/min Intravenous To OR Ivin Poot, MD      . Derrill Memo ON 03/14/2016] EPINEPHrine (ADRENALIN) 4 mg in dextrose 5 % 250 mL (0.016 mg/mL) infusion  0-10 mcg/min Intravenous To OR Ivin Poot, MD      . fentaNYL (SUBLIMAZE) injection    PRN Sherren Mocha, MD   25 mcg at 03/13/16 1145  . [START ON 03/14/2016] heparin 2,500 Units, papaverine 30 mg in electrolyte-148 (PLASMALYTE-148) 500 mL irrigation   Irrigation To OR Ivin Poot, MD      . Derrill Memo ON 03/14/2016] heparin 30,000 units/NS 1000 mL solution for CELLSAVER   Other To OR Ivin Poot, MD      . heparin infusion 2 units/mL in 0.9 % sodium chloride    Continuous PRN Sherren Mocha, MD   1,500 mL at 03/13/16 1044  . heparin injection    PRN Sherren Mocha, MD   3,000 Units at 03/13/16 1200  . [START ON 03/14/2016] insulin regular (NOVOLIN R,HUMULIN R) 250 Units in sodium chloride 0.9 % 250 mL (1 Units/mL) infusion   Intravenous To OR Ivin Poot, MD      . iopamidol (ISOVUE-370) 76 % injection  PRN Sherren Mocha, MD   85  mL at 03/13/16 1220  . lidocaine (PF) (XYLOCAINE) 1 % injection    PRN Sherren Mocha, MD   5 mL at 03/13/16 1157  . [START ON 03/14/2016] magnesium sulfate (IV Push/IM) injection 40 mEq  40 mEq Other To OR Ivin Poot, MD      . Derrill Memo ON 03/14/2016] metoprolol tartrate (LOPRESSOR) tablet 12.5 mg  12.5 mg Oral Once Ivin Poot, MD      . midazolam (VERSED) injection    PRN Sherren Mocha, MD   1 mg at 03/13/16 1050  . [START ON 03/14/2016] nitroGLYCERIN 50 mg in dextrose 5 % 250 mL (0.2 mg/mL) infusion  2-200 mcg/min Intravenous To OR Ivin Poot, MD      . norepinephrine (LEVOPHED) 4 mg in dextrose 5 % 250 mL (0.016 mg/mL) infusion    Continuous PRN Sherren Mocha, MD 18.8 mL/hr at 03/13/16 1113 5 mcg/min at 03/13/16 1113  . [START ON 03/14/2016] phenylephrine (NEO-SYNEPHRINE) 20 mg in dextrose 5 % 250 mL (0.08 mg/mL) infusion  30-200 mcg/min Intravenous To OR Ivin Poot, MD      . Derrill Memo ON 03/14/2016] potassium chloride injection 80 mEq  80 mEq Other To OR Ivin Poot, MD      . Radial Cocktail/Verapamil only    PRN Sherren Mocha, MD   10 mL at 03/13/16 1058  . temazepam (RESTORIL) capsule 15 mg  15 mg Oral Once PRN Ivin Poot, MD      . Derrill Memo ON 03/14/2016] vancomycin (VANCOCIN) 1,250 mg in sodium chloride 0.9 % 250 mL IVPB  1,250 mg Intravenous To OR Ivin Poot, MD        Facility-Administered Medications Prior to Admission  Medication Dose Route Frequency Provider Last Rate Last Dose  . aspirin chewable tablet 324 mg  324 mg Oral NOW Wellington Hampshire, MD       Prescriptions Prior to Admission  Medication Sig Dispense Refill Last Dose  . glipiZIDE (GLUCOTROL) 10 MG tablet Take 1 tablet (10 mg total) by mouth 2 (two) times daily before a meal. 180 tablet 0 Taking  . ibuprofen (ADVIL,MOTRIN) 200 MG tablet Take 200 mg by mouth every 6 (six) hours as needed. Take 1-2 pills for pain    Taking  . lisinopril (PRINIVIL,ZESTRIL) 10 MG tablet Take 1 tablet (10 mg total)  by mouth daily. 90 tablet 0 Taking  . metFORMIN (GLUCOPHAGE) 1000 MG tablet Take 1 tablet (1,000 mg total) by mouth 2 (two) times daily with a meal. 180 tablet 0 Taking  . omeprazole (PRILOSEC) 40 MG capsule Take 1 capsule (40 mg total) by mouth daily. 30 capsule 3 Taking  . rosuvastatin (CRESTOR) 40 MG tablet Take 1 tablet (40 mg total) by mouth daily. 90 tablet 0 Taking    History reviewed. No pertinent family history.   Review of Systems:       Cardiac Review of Systems: Y or nN  Chest Pain [  y-right shoulder pain ]  Resting SOB [   ] Exertional SOB  [ y ]  Orthopnea [  n]   Pedal Edema [ n  ]    Palpitations [n  ] Syncope  [n  ]   Presyncope [ n  ]  General Review of Systems: [Y] = yes [  ]=no Constitional: recent weight change [  ]; anorexia [  ]; fatigue [  ]; nausea [  ]; night sweats [  ];  fever [  ]; or chills [  ]                                                               Dental: poor dentition[  ]; Last Dentist visit: Edentulous   Eye : blurred vision [  ]; diplopia [   ]; vision changes [  ];  Amaurosis fugax[  ]; Resp: cough [ yes ];  wheezing[  ];  hemoptysis[  ]; shortness of breath[ yes  ]; paroxysmal nocturnal dyspnea[  ]; dyspnea on exertion[ yes ]; or orthopnea[  ];  GI:  gallstones[  ], vomiting[  ];  dysphagia[  ]; melena[  ];  hematochezia [  ]; heartburn[ yes  ];   Hx of  Colonoscopy[  ]; GU: kidney stones [  ]; hematuria[  ];   dysuria [  ];  nocturia[  ];  history of     obstruction [  ]; urinary frequency [  ]             Skin: rash, swelling[  ];, hair loss[  ];  peripheral edema[  ];  or itching[  ]; Musculosketetal: myalgias[  ];  joint swelling[  ];  joint erythema[  ];  joint pain[ yes history of neck surgery ];  back pain[  ];  Heme/Lymph: bruising[  ];  bleeding[  ];  anemia[  ];  Neuro: TIA[  ];  headaches[  ];  stroke[  ];  vertigo[  ];  seizures[  ];   paresthesias[  ];  difficulty walking[  ];  Psych:depression[  ]; anxiety[  ];  Endocrine:  diabetes[  ];  thyroid dysfunction[  ];  Immunizations: Flu [  ]; Pneumococcal[  ];  Other: Right-hand dominant, peripheral vascular disease with diabetic ischemic ulcer right great toe  Physical Exam: BP (!) 144/94   Pulse (!) 113   Resp 18   SpO2 98%        Physical Exam  General: Middle-aged Caucasian male on the cath lab table anxious in cardiogenic shock HEENT: Normocephalic pupils equal , dentition with upper plate Neck: Supple without JVD, adenopathy, or bruit Chest: Clear to auscultation, symmetrical breath sounds, no rhonchi, no tenderness             or deformity Cardiovascular: Regular rate and rhythm, holosystolic murmur left sternal border murmur, no gallop, peripheral pulses           Non-  palpable in all extremities Abdomen:  Soft, nontender, no palpable mass or organomegaly Extremities: Warm, well-perfused, no clubbing cyanosis edema or tenderness, walking boot covering right foot              no venous stasis changes of the legs Rectal/GU: Deferred Neuro: Grossly non--focal and symmetrical throughout Skin: Clean and dry without rash or ulceration   Diagnostic Studies & Laboratory data:     Recent Radiology Findings:   No results found.   I have independently reviewed the above radiologic studies.  Recent Lab Findings: Lab Results  Component Value Date   WBC 10.7 (H) 03/13/2016   HGB 13.6 03/13/2016   HCT 38.8 (L) 03/13/2016   PLT 233 03/13/2016   GLUCOSE 115 (H) 03/13/2016   CHOL 224 (H) 07/06/2013   TRIG 316 (H)  07/06/2013   HDL 34 (L) 07/06/2013   LDLCALC 127 (H) 07/06/2013   ALT 19 03/13/2016   AST 19 03/13/2016   NA 129 (L) 03/13/2016   K 3.7 03/13/2016   CL 101 03/13/2016   CREATININE 1.57 (H) 03/13/2016   BUN 32 (H) 03/13/2016   CO2 17 (L) 03/13/2016   TSH 1.885 07/10/2011   INR 0.96 03/13/2016   HGBA1C >14.0 07/06/2013      Assessment / Plan:     Acute MI-posterior lateral    Post MI VSD with cardiogenic shock    Multivessel  CAD    Poorly controlled diabetes mellitus     Peripheral vascular disease     Acute renal insufficiency from cardiogenic shock  I discussed the patient's condition and diagnosis with both the patient and his daughter [name]. Emergency CABG with VSD repair has been recommended. The patient and family understand this is a high risk operation but is the  only chance for long-term survival. They understand that a balloon pump has been placed to help support his blood pressure. I have discussed the possibility of mechanical ventricular support-VAD as a possibility postop.   I  spe @ME1 @ 03/13/2016 12:49 PM

## 2016-03-13 NOTE — Anesthesia Procedure Notes (Signed)
Central Venous Catheter Insertion Performed by: anesthesiologist Patient location: Pre-op. Preanesthetic checklist: patient identified, IV checked, site marked, risks and benefits discussed, surgical consent, monitors and equipment checked, pre-op evaluation, timeout performed and anesthesia consent Position: supine PA cath was placed.Swan type and PA catheter depth:thermodilationProcedure performed without using ultrasound guided technique. Attempts: 1 Patient tolerated the procedure well with no immediate complications.

## 2016-03-13 NOTE — Progress Notes (Signed)
  Echocardiogram 2D Echocardiogram has been performed.  Darlina Sicilian M 03/13/2016, 11:05 AM

## 2016-03-13 NOTE — Progress Notes (Signed)
Cardiology Office Note   Date:  03/13/2016   ID:  William [name], DOB 09-27-1950, MRN [id number]  PCP:  William Fendt, MD  Cardiologist:   William Sacramento, MD   Chief Complaint  Patient presents with  . New Evaluation  . PAD      History of Present Illness: William [name] is a 65 y.o. male who Was referred by Dr. Amalia [name] for evaluation of a right big toe ulcer and peripheral arterial disease. The patient has no prior cardiac history but has multiple chronic medical conditions that include diabetes mellitus since at least 2005, hypertension and hyperlipidemia. The patient developed right great toe ulceration recently. He underwent non-invasive vascular studies which showed mildly reduced ABI with evidence of right SFA disease and two-vessel runoff below the knee. However, upon talking with him today, he has been feeling poorly since Saturday with increased shortness of breath and orthopnea. He denies any chest pain. He is noted to be mildly tachycardic. An EKG was performed which showed evidence of inferior ST elevation myocardial infarction with reciprocal changes and inferior Q waves.    Past Medical History:  Diagnosis Date  . Diabetes mellitus without complication ([place])   . Hyperlipidemia   . Hypertension     No past surgical history on file.   Current Outpatient Prescriptions  Medication Sig Dispense Refill  . glipiZIDE (GLUCOTROL) 10 MG tablet Take 1 tablet (10 mg total) by mouth 2 (two) times daily before a meal. 180 tablet 0  . ibuprofen (ADVIL,MOTRIN) 200 MG tablet Take 200 mg by mouth every 6 (six) hours as needed. Take 1-2 pills for pain     . lisinopril (PRINIVIL,ZESTRIL) 10 MG tablet Take 1 tablet (10 mg total) by mouth daily. 90 tablet 0  . metFORMIN (GLUCOPHAGE) 1000 MG tablet Take 1 tablet (1,000 mg total) by mouth 2 (two) times daily with a meal. 180 tablet 0  . rosuvastatin (CRESTOR) 40 MG tablet Take 1 tablet (40 mg total) by mouth daily. 90 tablet 0  .  omeprazole (PRILOSEC) 40 MG capsule Take 1 capsule (40 mg total) by mouth daily. 30 capsule 3   No current facility-administered medications for this visit.     Allergies:   Morphine and related    Social History:  The patient  reports that he has been smoking Cigarettes.  He has been smoking about 0.50 packs per day. He does not have any smokeless tobacco history on file.   Family History:  Not able to obtain due to distress.  ROS:  Please see the history of present illness.   Otherwise, review of systems are positive for none.   All other systems are reviewed and negative.    PHYSICAL EXAM: VS:  Pulse (!) 111   Ht 5\' 5"  (1.651 m)   Wt 162 lb (73.5 kg)   BMI 26.96 kg/m  , BMI Body mass index is 26.96 kg/m. GEN: Well nourished, well developed, in no acute distress  HEENT: normal  Neck: no JVD, carotid bruits, or masses Cardiac: RRR; no  rubs, or gallops,no edema . 3/6 holosystolic murmur at the apex radiating to the  left sternal border. Respiratory:  clear to auscultation bilaterally, normal work of breathing GI: soft, nontender, nondistended, + BS MS: no deformity or atrophy  Skin: warm and dry, no rash Neuro:  Strength and sensation are intact Psych: euthymic mood, full affect   EKG:  EKG is ordered today. The ekg ordered today demonstrates : Sinus tachycardia with  a 4-5 mm of inferior ST elevation with inferior Q waves and reciprocal ST depression in 1, aVL as well as V1 and V2.   Recent Labs: No results found for requested labs within last 8760 hours.    Lipid Panel    Component Value Date/Time   CHOL 224 (H) 07/06/2013 1051   TRIG 316 (H) 07/06/2013 1051   HDL 34 (L) 07/06/2013 1051   CHOLHDL 6.6 07/06/2013 1051   VLDL 63 (H) 07/06/2013 1051   LDLCALC 127 (H) 07/06/2013 1051      Wt Readings from Last 3 Encounters:  03/13/16 162 lb (73.5 kg)  07/06/13 150 lb 3.2 oz (68.1 kg)  03/26/12 166 lb (75.3 kg)      No flowsheet data found.    ASSESSMENT  AND PLAN:  1.  Inferior ST elevation myocardial infarction with late presentation. The onset likely was an Saturday. However, the patient has persistent ST elevation and he is hypotensive. The patient was given 324 mg of aspirin. EMS were called and I activated a code STEMI. Recommend emergent cardiac catheterization.  The patient has a heart murmur suggestive of ischemic mitral regurgitation and possible papillary muscle rupture. Consider doing a stat echocardiogram before cardiac catheterization to ensure no mechanical complication that might require surgery. The other option would be to start with left ventricular angiography. The patient is tachycardic and hypotensive and this is concerning and indicates early cardiogenic shock.  I called a report to William [name].   2. Nonhealing ulcer on the right big toe with evidence of peripheral arterial disease: The patient will require lower extremity angiography and possible endovascular intervention once he recovers from #1.    Disposition:   Transfer to [facility] cath lab.   Signed,  William Sacramento, MD  03/13/2016 10:14 AM    [place]

## 2016-03-13 NOTE — Anesthesia Procedure Notes (Addendum)
Procedure Name: Intubation Date/Time: 03/13/2016 1:05 PM Performed by: Willeen Cass P Pre-anesthesia Checklist: Patient identified, Emergency Drugs available, Suction available, Patient being monitored and Timeout performed Patient Re-evaluated:Patient Re-evaluated prior to inductionOxygen Delivery Method: Circle system utilized Preoxygenation: Pre-oxygenation with 100% oxygen Intubation Type: IV induction and Rapid sequence Laryngoscope Size: Glidescope (Large adult GS) Grade View: Grade I Tube type: Subglottic suction tube Tube size: 8.5 mm Number of attempts: 1 Airway Equipment and Method: Rigid stylet and Video-laryngoscopy Placement Confirmation: ETT inserted through vocal cords under direct vision,  positive ETCO2 and breath sounds checked- equal and bilateral Secured at: 23 cm Tube secured with: Tape Dental Injury: Teeth and Oropharynx as per pre-operative assessment  Comments: Pt with hx of multiple neck fractures. Elective glidescope intubation with head/neck maintained in neutral position throughout airway management.

## 2016-03-13 NOTE — Transfer of Care (Signed)
Immediate Anesthesia Transfer of Care Note  Patient: William [name]  Procedure(s) Performed: Procedure(s): CORONARY ARTERY BYPASS GRAFTING (CABG) x 1 (SVG to OM) with EVH from [place] (N/A) VENTRICULAR SEPTAL DEFECT (VSD) REPAIR (N/A) TRANSESOPHAGEAL ECHOCARDIOGRAM (TEE) (N/A)  Patient Location: 2heart   Anesthesia Type:General  Level of Consciousness: Patient remains intubated per anesthesia plan  Airway & Oxygen Therapy: Patient placed on Ventilator (see vital sign flow sheet for setting)  Post-op Assessment: Report given to RN and Post -op Vital signs reviewed and stable  Post vital signs: Reviewed and stable  Last Vitals:  Vitals:   03/13/16 1245 03/13/16 2041  BP:  112/64  Pulse: (!) 113 (!) 124  Resp: 18     Last Pain:  Vitals:   03/13/16 1244  PainSc: Asleep         Complications: No apparent anesthesia complications

## 2016-03-13 NOTE — Progress Notes (Signed)
  Echocardiogram Echocardiogram Transesophageal has been performed.  William [name] 03/13/2016, 2:30 PM

## 2016-03-13 NOTE — Brief Op Note (Addendum)
03/13/2016  6:00 PM  PATIENT:  William [name]  65 y.o. male  PRE-OPERATIVE DIAGNOSIS:  1. S/P Posterior Lateral Acute Myocardial Infarction 2. CAD 3. Cardiogenic shock 4. Large VSD  POST-OPERATIVE DIAGNOSIS: 1. S/P Posterior Lateral Acute Myocardial Infarction 2. CAD 3. Cardiogenic shock 4. Large VSD   PROCEDURE:  TRANSESOPHAGEAL ECHOCARDIOGRAM (TEE), EMERGENT MEDIAN STERNOTOMY for CORONARY ARTERY BYPASS GRAFTING (CABG) x 1 (SVG to OM) with EVH from [place], REPAIR of LARGE VENTRICULAR SEPTAL DEFECT (VSD) with PATCH   SURGEON:  Surgeon(s) and Role:    * Ivin Poot, MD - Primary  PHYSICIAN ASSISTANT: Lars Pinks PA-C  ANESTHESIA:   general  EBL:  Total I/O In: 2200 [I.V.:2200] Out: 1400 [Urine:1400]  BLOOD ADMINISTERED:Two FFP and One PLTS, one Cryo, 2 units PRBC  DRAINS: Chest tubes placed in the mediastinal and pleural spaces   SPECIMEN:  Source of Specimen:  VSD tissue  DISPOSITION OF SPECIMEN:  PATHOLOGY  COUNTS CORRECT:  YES  DICTATION: .Dragon Dictation  PLAN OF CARE: Admit to inpatient   PATIENT DISPOSITION:  ICU - intubated and critically ill.   Delay start of Pharmacological VTE agent (>24hrs) due to surgical blood loss or risk of bleeding: yes  BASELINE WEIGHT: 74 kg

## 2016-03-14 ENCOUNTER — Inpatient Hospital Stay (HOSPITAL_COMMUNITY): Payer: Medicare Other

## 2016-03-14 ENCOUNTER — Ambulatory Visit: Payer: Medicare Other | Admitting: Podiatry

## 2016-03-14 ENCOUNTER — Encounter (HOSPITAL_COMMUNITY): Payer: Self-pay | Admitting: *Deleted

## 2016-03-14 LAB — CBC
HCT: 32.7 % — ABNORMAL LOW (ref 39.0–52.0)
HCT: 31 % — ABNORMAL LOW (ref 39.0–52.0)
Hemoglobin: 11.2 g/dL — ABNORMAL LOW (ref 13.0–17.0)
Hemoglobin: 10.5 g/dL — ABNORMAL LOW (ref 13.0–17.0)
MCH: 29.6 pg (ref 26.0–34.0)
MCH: 29.5 pg (ref 26.0–34.0)
MCHC: 34.3 g/dL (ref 30.0–36.0)
MCHC: 33.9 g/dL (ref 30.0–36.0)
MCV: 86.5 fL (ref 78.0–100.0)
MCV: 87.1 fL (ref 78.0–100.0)
PLATELETS: 197 10*3/uL (ref 150–400)
PLATELETS: 175 10*3/uL (ref 150–400)
RBC: 3.78 MIL/uL — AB (ref 4.22–5.81)
RBC: 3.56 MIL/uL — ABNORMAL LOW (ref 4.22–5.81)
RDW: 13 % (ref 11.5–15.5)
RDW: 13.4 % (ref 11.5–15.5)
WBC: 24.6 10*3/uL — ABNORMAL HIGH (ref 4.0–10.5)
WBC: 18.2 10*3/uL — AB (ref 4.0–10.5)

## 2016-03-14 LAB — BASIC METABOLIC PANEL
Anion gap: 7 (ref 5–15)
BUN: 22 mg/dL — AB (ref 6–20)
CALCIUM: 7.7 mg/dL — AB (ref 8.9–10.3)
CO2: 21 mmol/L — ABNORMAL LOW (ref 22–32)
CREATININE: 1.24 mg/dL (ref 0.61–1.24)
Chloride: 107 mmol/L (ref 101–111)
GFR calc Af Amer: >60 mL/min (ref >60)
GFR, EST NON AFRICAN AMERICAN: 60 mL/min — AB (ref >60)
Glucose, Bld: 155 mg/dL — ABNORMAL HIGH (ref 65–99)
POTASSIUM: 4.5 mmol/L (ref 3.5–5.1)
SODIUM: 135 mmol/L (ref 135–145)

## 2016-03-14 LAB — POCT I-STAT, CHEM 8
BUN: 22 mg/dL — ABNORMAL HIGH (ref 6–20)
CALCIUM ION: 1.12 mmol/L — AB (ref 1.15–1.40)
CHLORIDE: 101 mmol/L (ref 101–111)
CREATININE: 1.1 mg/dL (ref 0.61–1.24)
GLUCOSE: 175 mg/dL — AB (ref 65–99)
HCT: 27 % — ABNORMAL LOW (ref 39.0–52.0)
Hemoglobin: 9.2 g/dL — ABNORMAL LOW (ref 13.0–17.0)
Potassium: 4.1 mmol/L (ref 3.5–5.1)
Sodium: 136 mmol/L (ref 135–145)
TCO2: 24 mmol/L (ref 0–100)

## 2016-03-14 LAB — GLUCOSE, CAPILLARY
GLUCOSE-CAPILLARY: 142 mg/dL — AB (ref 65–99)
GLUCOSE-CAPILLARY: 133 mg/dL — AB (ref 65–99)
GLUCOSE-CAPILLARY: 148 mg/dL — AB (ref 65–99)
GLUCOSE-CAPILLARY: 136 mg/dL — AB (ref 65–99)
GLUCOSE-CAPILLARY: 110 mg/dL — AB (ref 65–99)
GLUCOSE-CAPILLARY: 106 mg/dL — AB (ref 65–99)
Glucose-Capillary: 123 mg/dL — ABNORMAL HIGH (ref 65–99)
Glucose-Capillary: 129 mg/dL — ABNORMAL HIGH (ref 65–99)
Glucose-Capillary: 168 mg/dL — ABNORMAL HIGH (ref 65–99)
Glucose-Capillary: 125 mg/dL — ABNORMAL HIGH (ref 65–99)
Glucose-Capillary: 132 mg/dL — ABNORMAL HIGH (ref 65–99)
Glucose-Capillary: 168 mg/dL — ABNORMAL HIGH (ref 65–99)
Glucose-Capillary: 153 mg/dL — ABNORMAL HIGH (ref 65–99)

## 2016-03-14 LAB — POCT I-STAT 3, ART BLOOD GAS (G3+)
Acid-base deficit: 4 mmol/L — ABNORMAL HIGH (ref 0.0–2.0)
Acid-base deficit: 2 mmol/L (ref 0.0–2.0)
BICARBONATE: 20.5 mmol/L (ref 20.0–28.0)
BICARBONATE: 24.1 mmol/L (ref 20.0–28.0)
BICARBONATE: 22 mmol/L (ref 20.0–28.0)
O2 SAT: 99 %
O2 SAT: 99 %
O2 SAT: 92 %
PCO2 ART: 36 mmHg (ref 32.0–48.0)
PCO2 ART: 36.1 mmHg (ref 32.0–48.0)
PCO2 ART: 34.4 mmHg (ref 32.0–48.0)
PO2 ART: 122 mmHg — AB (ref 83.0–108.0)
Patient temperature: 36.9
Patient temperature: 37.6
Patient temperature: 37.8
TCO2: 22 mmol/L (ref 0–100)
TCO2: 25 mmol/L (ref 0–100)
TCO2: 23 mmol/L (ref 0–100)
pH, Arterial: 7.363 (ref 7.350–7.450)
pH, Arterial: 7.435 (ref 7.350–7.450)
pH, Arterial: 7.416 (ref 7.350–7.450)
pO2, Arterial: 118 mmHg — ABNORMAL HIGH (ref 83.0–108.0)
pO2, Arterial: 66 mmHg — ABNORMAL LOW (ref 83.0–108.0)

## 2016-03-14 LAB — PREPARE FRESH FROZEN PLASMA
UNIT DIVISION: 0
Unit division: 0

## 2016-03-14 LAB — PREPARE CRYOPRECIPITATE: UNIT DIVISION: 0

## 2016-03-14 LAB — MAGNESIUM
MAGNESIUM: 4 mg/dL — AB (ref 1.7–2.4)
Magnesium: 2.9 mg/dL — ABNORMAL HIGH (ref 1.7–2.4)

## 2016-03-14 LAB — MRSA PCR SCREENING: MRSA by PCR: NEGATIVE

## 2016-03-14 LAB — HEMOGLOBIN A1C
HEMOGLOBIN A1C: 10.7 % — AB (ref 4.8–5.6)
Mean Plasma Glucose: 260 mg/dL

## 2016-03-14 LAB — CREATININE, SERUM
Creatinine, Ser: 1.21 mg/dL (ref 0.61–1.24)
GFR calc non Af Amer: >60 mL/min (ref >60)

## 2016-03-14 MED ORDER — INFLUENZA VAC SPLIT QUAD 0.5 ML IM SUSY
0.5000 mL | PREFILLED_SYRINGE | Freq: Once | INTRAMUSCULAR | Status: AC
  Administered 2016-03-23: 0.5 mL via INTRAMUSCULAR
  Filled 2016-03-14: qty 0.5

## 2016-03-14 MED ORDER — METOCLOPRAMIDE HCL 5 MG/ML IJ SOLN
10.0000 mg | Freq: Four times a day (QID) | INTRAMUSCULAR | Status: DC
  Administered 2016-03-14 – 2016-03-15 (×5): 10 mg via INTRAVENOUS
  Filled 2016-03-14 (×5): qty 2

## 2016-03-14 MED ORDER — FUROSEMIDE 10 MG/ML IJ SOLN
20.0000 mg | Freq: Two times a day (BID) | INTRAMUSCULAR | Status: DC
  Administered 2016-03-14: 20 mg via INTRAVENOUS
  Filled 2016-03-14: qty 2

## 2016-03-14 MED ORDER — FUROSEMIDE 10 MG/ML IJ SOLN
40.0000 mg | Freq: Two times a day (BID) | INTRAMUSCULAR | Status: DC
Start: 2016-03-14 — End: 2016-03-20
  Administered 2016-03-14 – 2016-03-19 (×11): 40 mg via INTRAVENOUS
  Filled 2016-03-14 (×11): qty 4

## 2016-03-14 MED ORDER — ALBUMIN HUMAN 25 % IV SOLN
12.5000 g | Freq: Four times a day (QID) | INTRAVENOUS | Status: AC
  Administered 2016-03-14 (×3): 12.5 g via INTRAVENOUS
  Filled 2016-03-14 (×3): qty 50

## 2016-03-14 MED ORDER — ORAL CARE MOUTH RINSE
15.0000 mL | Freq: Two times a day (BID) | OROMUCOSAL | Status: DC
  Administered 2016-03-14 – 2016-04-02 (×32): 15 mL via OROMUCOSAL

## 2016-03-14 MED ORDER — FUROSEMIDE 10 MG/ML IJ SOLN
20.0000 mg | Freq: Once | INTRAMUSCULAR | Status: AC
  Administered 2016-03-14: 20 mg via INTRAVENOUS
  Filled 2016-03-14: qty 2

## 2016-03-14 MED ORDER — LEVALBUTEROL HCL 1.25 MG/0.5ML IN NEBU
1.2500 mg | INHALATION_SOLUTION | Freq: Four times a day (QID) | RESPIRATORY_TRACT | Status: DC
  Administered 2016-03-14 – 2016-03-15 (×6): 1.25 mg via RESPIRATORY_TRACT
  Filled 2016-03-14 (×6): qty 0.5

## 2016-03-14 MED ORDER — INSULIN ASPART 100 UNIT/ML ~~LOC~~ SOLN
0.0000 [IU] | SUBCUTANEOUS | Status: DC
  Administered 2016-03-14: 2 [IU] via SUBCUTANEOUS
  Administered 2016-03-14: 4 [IU] via SUBCUTANEOUS
  Administered 2016-03-15: 2 [IU] via SUBCUTANEOUS
  Administered 2016-03-15: 4 [IU] via SUBCUTANEOUS
  Administered 2016-03-15: 2 [IU] via SUBCUTANEOUS
  Administered 2016-03-15: 4 [IU] via SUBCUTANEOUS
  Administered 2016-03-15 (×2): 2 [IU] via SUBCUTANEOUS
  Administered 2016-03-16: 8 [IU] via SUBCUTANEOUS
  Administered 2016-03-16 (×3): 4 [IU] via SUBCUTANEOUS
  Administered 2016-03-17: 2 [IU] via SUBCUTANEOUS

## 2016-03-14 MED ORDER — SODIUM BICARBONATE 8.4 % IV SOLN
25.0000 meq | Freq: Once | INTRAVENOUS | Status: AC
  Administered 2016-03-14: 25 meq via INTRAVENOUS

## 2016-03-14 MED ORDER — INSULIN DETEMIR 100 UNIT/ML ~~LOC~~ SOLN: 12.0000 [IU] | Freq: Two times a day (BID) | SUBCUTANEOUS | Status: DC

## 2016-03-14 MED ORDER — MOMETASONE FURO-FORMOTEROL FUM 200-5 MCG/ACT IN AERO
2.0000 | INHALATION_SPRAY | Freq: Two times a day (BID) | RESPIRATORY_TRACT | Status: DC
  Administered 2016-03-14 – 2016-04-03 (×39): 2 via RESPIRATORY_TRACT
  Filled 2016-03-14 (×2): qty 8.8

## 2016-03-14 MED ORDER — INSULIN DETEMIR 100 UNIT/ML ~~LOC~~ SOLN
12.0000 [IU] | Freq: Two times a day (BID) | SUBCUTANEOUS | Status: DC
  Administered 2016-03-14 – 2016-03-16 (×6): 12 [IU] via SUBCUTANEOUS
  Filled 2016-03-14 (×8): qty 0.12

## 2016-03-14 MED ORDER — SODIUM CHLORIDE 0.9% FLUSH
10.0000 mL | INTRAVENOUS | Status: DC | PRN
  Administered 2016-03-15 – 2016-03-16 (×2): 10 mL
  Administered 2016-03-17 – 2016-03-18 (×2): 20 mL
  Filled 2016-03-14 (×4): qty 40

## 2016-03-14 MED FILL — Magnesium Sulfate Inj 50%: INTRAMUSCULAR | Qty: 10 | Status: AC

## 2016-03-14 MED FILL — Calcium Chloride Inj 10%: INTRAVENOUS | Qty: 10 | Status: AC

## 2016-03-14 MED FILL — Potassium Chloride Inj 2 mEq/ML: INTRAVENOUS | Qty: 40 | Status: AC

## 2016-03-14 MED FILL — Heparin Sodium (Porcine) Inj 1000 Unit/ML: INTRAMUSCULAR | Qty: 30 | Status: AC

## 2016-03-14 MED FILL — Lidocaine HCl IV Inj 20 MG/ML: INTRAVENOUS | Qty: 5 | Status: AC

## 2016-03-14 MED FILL — Sodium Bicarbonate IV Soln 8.4%: INTRAVENOUS | Qty: 50 | Status: AC

## 2016-03-14 MED FILL — Nitroglycerin IV Soln 100 MCG/ML in D5W: INTRA_ARTERIAL | Qty: 10 | Status: AC

## 2016-03-14 MED FILL — Mannitol IV Soln 20%: INTRAVENOUS | Qty: 500 | Status: AC

## 2016-03-14 MED FILL — Heparin Sodium (Porcine) Inj 1000 Unit/ML: INTRAMUSCULAR | Qty: 10 | Status: AC

## 2016-03-14 MED FILL — Sodium Chloride IV Soln 0.9%: INTRAVENOUS | Qty: 3000 | Status: AC

## 2016-03-14 MED FILL — Electrolyte-R (PH 7.4) Solution: INTRAVENOUS | Qty: 5000 | Status: AC

## 2016-03-14 NOTE — Progress Notes (Signed)
Pt extubated to 4 L [place] per rapid wean protocol. NIF -30, VC 1.3L. Pt tol well. No distress noted. Pt able to vocalize well. Will cont to monitor.

## 2016-03-14 NOTE — Anesthesia Postprocedure Evaluation (Signed)
Anesthesia Post Note  Patient: William [name]  Procedure(s) Performed: Procedure(s) (LRB): CORONARY ARTERY BYPASS GRAFTING (CABG) x 1 (SVG to OM) with EVH from [place] (N/A) VENTRICULAR SEPTAL DEFECT (VSD) REPAIR (N/A) TRANSESOPHAGEAL ECHOCARDIOGRAM (TEE) (N/A)  Patient location during evaluation: SICU Anesthesia Type: General Level of consciousness: sedated, patient remains intubated per anesthesia plan and responds to stimulation Pain management: pain level controlled Respiratory status: patient remains intubated per anesthesia plan and patient on ventilator - see flowsheet for VS Cardiovascular status: stable Anesthetic complications: no    Last Vitals:  Vitals:   03/14/16 0145 03/14/16 0200  BP:  119/81  Pulse: (!) 53 (!) 53  Resp: 14 18  Temp: 36.9 C 36.9 C    Last Pain:  Vitals:   03/13/16 1244  PainSc: Asleep                 Avantae Bither,E. Sheyli Horwitz

## 2016-03-14 NOTE — Op Note (Signed)
NAME:  William [name], William [name] NO.:  [id number]  MEDICAL RECORD NO.:  [id number]  LOCATION:  2S02C                        FACILITY:  [facility]  PHYSICIAN:  Ivin Poot, M.D.  DATE OF BIRTH:  [birth year]  DATE OF PROCEDURE:  03/13/2016 DATE OF DISCHARGE:                              OPERATIVE REPORT   OPERATION: 1. Emergency coronary artery bypass grafting x1 (saphenous vein graft     to obtuse marginal). 2. Emergency repair of post MI inferior septal VSD.  SURGEON:  Ivin Poot, M.D.  ASSISTANT:  Lars Pinks, PA-C.  PREOPERATIVE DIAGNOSIS: 1. Acute inferior myocardial infarction. 2. Post myocardial infarction ventricular septal defect with     cardiogenic shock.  ANESTHESIA:  General by Dr. Oleta Mouse and Dr. Annye Asa.  INDICATIONS:  The patient is a 65 year old, diabetic smoker, who presented to an outside facility for evaluation of his progressive peripheral vascular disease and with a history of 5 days of chest pain involving his right shoulder.  He was noted to be short of breath and low blood pressure and an EKG done at the facility demonstrated an acute inferior wall MI.  He was transferred directly to [facility] for STEMI protocol catheterization.  The patient was found to have total occlusion of the large dominant right coronary just distal to the RV marginal branch.  There was associated mild disease of LAD and moderate stenosis of a nondominant circumflex.  He had elevated LVEDP, elevated PA pressures, and the ventriculogram showed evidence of an inferior post MI VSD.  This was documented by echo as well.  There is mild-to-moderate mitral regurgitation.  The patient required inotropes-Levophed for blood pressure support and a balloon pump was placed following his coronary angiogram.  CT surgical evaluation was requested.  I reviewed the patient in the cardiac cath lab and discussed the patient and reviewed his coronary  arteriograms with his cardiologist Dr. Burt Knack.  I agreed with the recommendation for emergency cardiac surgery for bypass grafting as well as repair of the posterior-inferior VSD.  I discussed the procedure with the patient as well as his daughter including the expected benefits, alternatives, and risks.  He understood there is risk of death, stroke, heart failure from the MI, infection, postoperative pulmonary problems, and arrhythmias.  All these issues were discussed with the patient's daughter as well and informed consent was obtained.  FINDINGS: 1. Small but adequate saphenous vein conduit. 2. LAD did not have significant disease and was not grafted. 3. The inferior LV  wall and  RV in the distribution of the distal dominant RCA [acute margin of RV to obtuse margin of LV] ]was     completely infarcted and with necrotic heart muscle, and no bypasses     were placed to the right coronary system. 4. Large 2-3 cm inferior-basal VSD successfully repaired.  OPERATIVE PROCEDURE:  The patient was brought directly to the operating room from the cath lab and was placed on the OR table.  Invasive hemodynamic monitoring lines were placed, and the patient was inducted for general anesthesia and intubated.  Although his blood pressure remained stable on a balloon pump, a blood gas demonstrated severe  acidosis with a base deficit of -10.  The patient was continued on his Levophed and was given IV bicarb.  The transesophageal echo documented the post MI VSD with mild-moderate MR and biventricular dysfunction.  The patient was prepped and draped as a sterile field.  A proper time-out was performed.  A sternal incision was made as the saphenous vein was harvested endoscopically from the left leg.  There was a small but adequate conduit.  After the sternotomy, the pericardium was opened.  There was a large bloody pericardial effusion.  The epicardial fat of the RV was ecchymotic from the blood  dissected from the infarct.  Pursestrings were placed in ascending aorta and right atrium and heparin was administered. When the ACT was documented as being therapeutic, the patient was cannulated and placed on cardiopulmonary bypass.  A 2nd inferior vena cava cannula was placed for bicaval drainage.  LV vent was placed via the right superior pulmonary vein and cable tapes were placed. Cardioplegia cannulas were placed both antegrade and retrograde cold blood cardioplegia.  The circumflex vessel was identified for grafting. The patient was cooled to 30 degrees, and the crossclamp was applied.  A liter of cold blood cardioplegia was delivered in split doses between the antegrade aortic and retrograde coronary sinus catheters.  There was good cardioplegic arrest, and septal temperature dropped less than 12 degrees.  Cardioplegia was delivered every 20 minutes or less.  First, the vein graft to the OM was performed.  The OM was 1.5-mm vessel, proximally with 75-80% stenosis.  A reverse saphenous vein was sewn end-to-side with running 7-0 Prolene with good flow through the graft.  Cardioplegia was redosed.  The heart was then lifted to expose the inferior wall.  There was a large infarct of both the RV and LV centered on the posterior descending artery.  The heart muscle on the LV was thinned in the area of the infarct and was also hemorrhagic.  Two ventriculotomies were made on either side of the septum into the LV and RV.  The VSD was visualized through the incision in the RV and was approximately 2.5 to 3 cm.  The necrotic muscle was debrided.  Teflon Felt patch was cut to the appropriate size of the defect.  2-0 Ethibond pledgeted sutures were placed around the circumference of the VSD.  These were then brought through the Teflon patch, and the patch was seated to cover the defect and the sutures were all tied.  Cardioplegia was delivered as needed.  Next, the defect was exposed from  the left ventricular side through the ventriculotomy.  A second patch was cut to the appropriate size and configuration.  Several pledgeted 2-0 Ethibond sutures were then placed around the circumference of this patch through the septal muscle and then through the patch on the RV side.  They were all tied individually and they appeared to be secure closure of the VSD.  Next, the 2 ventriculostomies were closed on either side of the septum using Teflon Felt strips, interrupted 2-0 Ethibond sutures, reinforced with a running 3-0 Prolene, and both sites were then coated with thin layer of biologic adhesive-BioGlue.  The patient was then rewarmed.  While the VSD repair was being performed, the pericardium was insufflated with CO2.  The proximal anastomosis of the vein graft was performed to the ascending aorta with the crossclamp still in place, using a 4.5 mm punch and running 6-0 Prolene.  Air was vented from the coronaries on the left side  of the heart using a dose of retrograde warm blood cardioplegia and the usual de-airing maneuvers on bypass.  The crossclamp was removed. The heart was rewarmed and reperfused.  The vein graft was opened, de- aired, and checked for hemostasis at the proximal and distal anastomoses.  The temporary pacing wires were applied as the patient was being rewarmed and reperfused.  The cardioplegic catheters were removed. The LV vent was removed after the patient was paced in a rhythm.  The patient was started on low-dose inotropes including Milrinone, Levophed, and epinephrine.  The balloon pump was placed at 1-1 and the lungs were expanded and the ventilator was resumed.  The left pleural space was obliterated with adhesions.  The right pleural space had approximately 500 mL of thin pleural effusion.  This was drained.  The patient was then was then weaned slowly off cardiopulmonary bypass. Echo showed improvement of the mitral regurgitation.  There was  no evidence of residual VSD.  There was adequate, but reduced biventricular function.  Hemodynamics were stable.  There was some difficulty getting the Swan catheter placed in the pulmonary artery, but the patient continued to do well by monitoring the transesophageal echo for biventricular function and valvular function.  Protamine was administered without adverse reaction.  There was some diffuse coagulopathy related bleeding, and this improved after transfusion of 2 units of FFP.  The patient was given 1 unit of cryoprecipitate as well.  Right pleural anterior mediastinal and posterior mediastinal chest tubes were placed and brought out through separate incisions.  The repair of the ventriculostomies was satisfactory and were hemostatic.  The superior pericardial fat was closed over the aorta.  The sternum was closed with interrupted steel wire.  The pectoralis fascia was closed with a running #1 Vicryl.  The subcutaneous and skin layers were closed in running Vicryl and sterile dressings were applied.  Total cardiopulmonary bypass time was 200 minutes.     Ivin Poot, M.D.     PV/MEDQ  D:  03/13/2016  T:  03/14/2016  Job:  [id number]  cc:   [name], MD

## 2016-03-14 NOTE — Progress Notes (Signed)
Peripherally Inserted Central Catheter/Midline Placement  The IV Nurse has discussed with the patient and/or persons authorized to consent for the patient, the purpose of this procedure and the potential benefits and risks involved with this procedure.  The benefits include less needle sticks, lab draws from the catheter, and the patient may be discharged home with the catheter. Risks include, but not limited to, infection, bleeding, blood clot (thrombus formation), and puncture of an artery; nerve damage and irregular heartbeat and possibility to perform a PICC exchange if needed/ordered by physician.  Alternatives to this procedure were also discussed.  Bard Power PICC patient education guide, fact sheet on infection prevention and patient information card has been provided to patient /or left at bedside.    PICC/Midline Placement Documentation  PICC Triple Lumen 03/14/16 PICC Left Brachial 41 cm 2 cm (Active)  Indication for Insertion or Continuance of Line Poor Vasculature-patient has had multiple peripheral attempts or PIVs lasting less than 24 hours 03/14/2016 12:05 PM  Exposed Catheter (cm) 2 cm 03/14/2016 12:05 PM  Dressing Change Due 03/21/16 03/14/2016 12:05 PM    Telephone consent given by the patients daughter verified between [place] and [place], [place] 03/14/2016, 12:06 PM

## 2016-03-14 NOTE — Consult Note (Addendum)
[facility] Nurse wound consult note Reason for Consult: necrotic toe Wound type: Right great toe has a DTI that encircles the entire toe in a band that is 4cm wide with some areas of peeling skin as well as an unstageable wound on medial aspect of great toe. Pressure Ulcer POA: No, this wound not due to pressure Measurement:3cm x 2.5cm x 0.5 Wound bed: has a blackened ? Blood filled blister vs eschar (has been wrapped in plastic and is too macerated to tell) Drainage (amount, consistency, odor) no drainage, moderate odor Periwound:macerated and a circle of suspected DTI encompassing the toe Dressing procedure/placement/frequency:  I have provided nurses with orders for xeroform and gauze dressing BID until I return Friday to re-evaluate when the toe has been left more open to air.  I have applied first dressing. We will follow this patient until Friday to re-evaluate and remain available to this patient, nursing, and the medical and surgical teams.    Fara Olden, RN-C, WTA-C Wound Treatment Associate

## 2016-03-14 NOTE — Procedures (Signed)
Sterile prep, drape, FBP L wrist.  #22ga AC L radial artery first pass, good wave form.  Biopatch and sterile dressing applied.  Jenita Seashore, MD  [phone]

## 2016-03-14 NOTE — Progress Notes (Signed)
Patient ID: [name], male   DOB: 1950-11-16, 65 y.o.   MRN: [id number] EVENING ROUNDS NOTE :     [address]       [place] [address]             [phone]                 1 Day Post-Op Procedure(s) (LRB): CORONARY ARTERY BYPASS GRAFTING (CABG) x 1 (SVG to OM) with EVH from [place] (N/A) VENTRICULAR SEPTAL DEFECT (VSD) REPAIR (N/A) TRANSESOPHAGEAL ECHOCARDIOGRAM (TEE) (N/A)  Total Length of Stay:  LOS: 1 day  BP (!) 87/43   Pulse (!) 52   Temp 100 F (37.8 C)   Resp (!) 22   Ht 5\' 5"  (1.651 m)   Wt 176 lb 12.9 oz (80.2 kg)   SpO2 93%   BMI 29.42 kg/m   .Intake/Output      09/12 0701 - 09/13 0700 09/13 0701 - 09/14 0700   I.V. (mL/kg) 5294.9 (66) 1459.1 (18.2)   Blood 1430    IV Piggyback 900 150   Total Intake(mL/kg) 7624.9 (95.1) 1609.1 (20.1)   Urine (mL/kg/hr) 2805 1305 (1.4)   Emesis/NG output 200    Blood 1655    Chest Tube 290 120 (0.1)   Total Output 4950 1425   Net +2674.9 +184.1          . sodium chloride 20 mL/hr at 03/14/16 1700  . sodium chloride Stopped (03/14/16 1000)  . sodium chloride 20 mL/hr at 03/14/16 1700  . dexmedetomidine Stopped (03/14/16 1445)  . EPINEPHrine 4 mg in dextrose 5% 250 mL infusion (16 mcg/mL) 1.013 mcg/min (03/14/16 1700)  . insulin (NOVOLIN-R) infusion Stopped (03/14/16 1200)  . lactated ringers 20 mL/hr at 03/14/16 1700  . lactated ringers 20 mL/hr at 03/14/16 1700  . milrinone 0.3 mcg/kg/min (03/14/16 1700)  . norepinephrine (LEVOPHED) Adult infusion Stopped (03/13/16 2030)  . phenylephrine (NEO-SYNEPHRINE) Adult infusion 30 mcg/min (03/14/16 1700)     Lab Results  Component Value Date   WBC 18.2 (H) 03/14/2016   HGB 10.5 (L) 03/14/2016   HCT 31.0 (L) 03/14/2016   PLT 175 03/14/2016   GLUCOSE 175 (H) 03/14/2016   CHOL 96 03/13/2016   TRIG 146 03/13/2016   HDL 23 (L) 03/13/2016   LDLCALC 44 03/13/2016   ALT 19 03/13/2016   AST 19 03/13/2016   NA 136 03/14/2016   K  4.1 03/14/2016   CL 101 03/14/2016   CREATININE 1.21 03/14/2016   BUN 22 (H) 03/14/2016   CO2 21 (L) 03/14/2016   TSH 1.885 07/10/2011   INR 1.42 03/13/2016   HGBA1C 10.7 (H) 03/13/2016     Lab Results  Component Value Date   CREATININE 1.21 03/14/2016   Estimated Creatinine Clearance: 60.2 mL/min (by C-G formula based on SCr of 1.21 mg/dL).  Now extubated, awake and alert  Still with iab and epi drip  Pedal pulses with doppler only  Grace Isaac MD  [place] Office [phone] 03/14/2016 6:17 PM

## 2016-03-14 NOTE — Progress Notes (Signed)
   PIC line is being placed at the time of rounding.  Chart reviewed. Echo and angiographic images reviewed.  Plan to follow and help as required.

## 2016-03-14 NOTE — Progress Notes (Signed)
EKG CRITICAL VALUE     12 lead EKG performed.  Critical value noted.  Kathlee Nations, RN notified.   Neva Seat, CCT 03/14/2016 6:40 AM

## 2016-03-14 NOTE — Progress Notes (Signed)
6 fr R Radial sheath remove. Prior to removal Sheath with blood return. R hand cap refill less than 3 second, nailbeds pink, hand warm. Sheath removed, TR Band applied 12 cc inserted.No neuro vascular compromise . RN Kathlee Nations at bedside aware.

## 2016-03-14 NOTE — Progress Notes (Signed)
1 Day Post-Op Procedure(s) (LRB): CORONARY ARTERY BYPASS GRAFTING (CABG) x 1 (SVG to OM) with EVH from LEFT GREATER SAPHENOUS VEIN (N/A) VENTRICULAR SEPTAL DEFECT (VSD) REPAIR (N/A) TRANSESOPHAGEAL ECHOCARDIOGRAM (TEE) (N/A) Subjective: Opens eyes on vent after CABG, repair posterior post MI VSD nsr IABP 1:2 , low dose milrinone, neo, epi Pulmonary status better- attempt extubation Leave IABP until tomorrow Objective: Vital signs in last 24 hours: Temp:  [97.3 F (36.3 C)-100.6 F (38.1 C)] 100.6 F (38.1 C) (09/13 0800) Pulse Rate:  [0-186] 52 (09/13 0800) Cardiac Rhythm: Sinus tachycardia (09/13 0800) Resp:  [0-34] 19 (09/13 0800) BP: (76-145)/(51-100) 108/87 (09/13 0800) SpO2:  [0 %-100 %] 100 % (09/13 0800) FiO2 (%):  [40 %-50 %] 40 % (09/13 0800) Weight:  [162 lb (73.5 kg)-176 lb 12.9 oz (80.2 kg)] 176 lb 12.9 oz (80.2 kg) (09/13 0424)  Hemodynamic parameters for last 24 hours: PAP: (14-26)/(4-19) 23/18 CO:  [3.3 L/min-3.7 L/min] 3.4 L/min CI:  [1.8 L/min/m2-2 L/min/m2] 1.9 L/min/m2  Intake/Output from previous day: 09/12 0701 - 09/13 0700 In: 7624.9 [I.V.:5294.9; Blood:1430; IV Piggyback:900] Out: 4950 [Urine:2805; Emesis/NG output:200; Blood:1655; Chest Tube:290] Intake/Output this shift: Total I/O In: 364.1 [I.V.:364.1] Out: 150 [Urine:100; Chest Tube:50]  Coarse breath sounds abd soft No murmur  Lab Results:  Recent Labs  03/13/16 2100 03/14/16 0300  WBC 25.4* 24.6*  HGB 11.5* 11.2*  HCT 33.5* 32.7*  PLT 180 197   BMET:  Recent Labs  03/13/16 1057  03/13/16 1906 03/13/16 2058 03/14/16 0300  NA 129*  < > 135 139 135  K 3.7  < > 3.3* 3.1* 4.5  CL 101  < > 101  --  107  CO2 17*  --   --   --  21*  GLUCOSE 115*  < > 173* 138* 155*  BUN 32*  < > 24*  --  22*  CREATININE 1.57*  < > 0.90  --  1.24  CALCIUM 8.5*  --   --   --  7.7*  < > = values in this interval not displayed.  PT/INR:  Recent Labs  03/13/16 2100  LABPROT 17.5*  INR 1.42    ABG    Component Value Date/Time   PHART 7.363 03/14/2016 0147   HCO3 20.5 03/14/2016 0147   TCO2 22 03/14/2016 0147   ACIDBASEDEF 4.0 (H) 03/14/2016 0147   O2SAT 99.0 03/14/2016 0147   CBG (last 3)   Recent Labs  03/14/16 0459 03/14/16 0601 03/14/16 0651  GLUCAP 142* 133* 125*    Assessment/Plan: S/P Procedure(s) (LRB): CORONARY ARTERY BYPASS GRAFTING (CABG) x 1 (SVG to OM) with EVH from [place] (N/A) VENTRICULAR SEPTAL DEFECT (VSD) REPAIR (N/A) TRANSESOPHAGEAL ECHOCARDIOGRAM (TEE) (N/A) Diuresis Diabetes control See progression orders leave IABP 1:2   LOS: 1 day    William [name] 03/14/2016

## 2016-03-15 ENCOUNTER — Inpatient Hospital Stay (HOSPITAL_COMMUNITY): Payer: Medicare Other

## 2016-03-15 DIAGNOSIS — Q210 Ventricular septal defect: Secondary | ICD-10-CM

## 2016-03-15 DIAGNOSIS — I480 Paroxysmal atrial fibrillation: Secondary | ICD-10-CM

## 2016-03-15 DIAGNOSIS — E785 Hyperlipidemia, unspecified: Secondary | ICD-10-CM

## 2016-03-15 DIAGNOSIS — I25110 Atherosclerotic heart disease of native coronary artery with unstable angina pectoris: Secondary | ICD-10-CM

## 2016-03-15 LAB — POCT ACTIVATED CLOTTING TIME
ACTIVATED CLOTTING TIME: 158 s
Activated Clotting Time: 153 seconds
Activated Clotting Time: 147 seconds

## 2016-03-15 LAB — BLOOD GAS, ARTERIAL
Acid-Base Excess: 0.4 mmol/L (ref 0.0–2.0)
Bicarbonate: 23.8 mmol/L (ref 20.0–28.0)
Drawn by: 437071
FIO2: 32
O2 Saturation: 95.4 %
Patient temperature: 98.8
pCO2 arterial: 34.5 mmHg (ref 32.0–48.0)
pH, Arterial: 7.455 — ABNORMAL HIGH (ref 7.350–7.450)
pO2, Arterial: 76.2 mmHg — ABNORMAL LOW (ref 83.0–108.0)

## 2016-03-15 LAB — COMPREHENSIVE METABOLIC PANEL
ALT: 18 U/L (ref 17–63)
AST: 42 U/L — ABNORMAL HIGH (ref 15–41)
Albumin: 2.6 g/dL — ABNORMAL LOW (ref 3.5–5.0)
Alkaline Phosphatase: 92 U/L (ref 38–126)
Anion gap: 8 (ref 5–15)
BUN: 23 mg/dL — ABNORMAL HIGH (ref 6–20)
CO2: 24 mmol/L (ref 22–32)
Calcium: 7.8 mg/dL — ABNORMAL LOW (ref 8.9–10.3)
Chloride: 105 mmol/L (ref 101–111)
Creatinine, Ser: 1.18 mg/dL (ref 0.61–1.24)
GFR calc Af Amer: >60 mL/min (ref >60)
GFR calc non Af Amer: >60 mL/min (ref >60)
Glucose, Bld: 165 mg/dL — ABNORMAL HIGH (ref 65–99)
Potassium: 3.9 mmol/L (ref 3.5–5.1)
Sodium: 137 mmol/L (ref 135–145)
Total Bilirubin: 1.2 mg/dL (ref 0.3–1.2)
Total Protein: 4.9 g/dL — ABNORMAL LOW (ref 6.5–8.1)

## 2016-03-15 LAB — CBC
HCT: 28.4 % — ABNORMAL LOW (ref 39.0–52.0)
Hemoglobin: 9.6 g/dL — ABNORMAL LOW (ref 13.0–17.0)
MCH: 29.9 pg (ref 26.0–34.0)
MCHC: 33.8 g/dL (ref 30.0–36.0)
MCV: 88.5 fL (ref 78.0–100.0)
Platelets: 166 10*3/uL (ref 150–400)
RBC: 3.21 MIL/uL — ABNORMAL LOW (ref 4.22–5.81)
RDW: 13.6 % (ref 11.5–15.5)
WBC: 19.8 10*3/uL — ABNORMAL HIGH (ref 4.0–10.5)

## 2016-03-15 LAB — POCT I-STAT, CHEM 8
BUN: 25 mg/dL — AB (ref 6–20)
CALCIUM ION: 1.13 mmol/L — AB (ref 1.15–1.40)
CHLORIDE: 99 mmol/L — AB (ref 101–111)
Creatinine, Ser: 1 mg/dL (ref 0.61–1.24)
Glucose, Bld: 131 mg/dL — ABNORMAL HIGH (ref 65–99)
HEMATOCRIT: 28 % — AB (ref 39.0–52.0)
Hemoglobin: 9.5 g/dL — ABNORMAL LOW (ref 13.0–17.0)
POTASSIUM: 3.6 mmol/L (ref 3.5–5.1)
SODIUM: 136 mmol/L (ref 135–145)
TCO2: 23 mmol/L (ref 0–100)

## 2016-03-15 LAB — GLUCOSE, CAPILLARY
GLUCOSE-CAPILLARY: 142 mg/dL — AB (ref 65–99)
GLUCOSE-CAPILLARY: 144 mg/dL — AB (ref 65–99)
GLUCOSE-CAPILLARY: 162 mg/dL — AB (ref 65–99)
GLUCOSE-CAPILLARY: 163 mg/dL — AB (ref 65–99)
GLUCOSE-CAPILLARY: 170 mg/dL — AB (ref 65–99)
Glucose-Capillary: 141 mg/dL — ABNORMAL HIGH (ref 65–99)

## 2016-03-15 LAB — APTT: aPTT: 46 seconds — ABNORMAL HIGH (ref 24–36)

## 2016-03-15 LAB — PROTIME-INR
INR: 1.33
Prothrombin Time: 16.6 seconds — ABNORMAL HIGH (ref 11.4–15.2)

## 2016-03-15 LAB — CARBOXYHEMOGLOBIN
Carboxyhemoglobin: 1 % (ref 0.5–1.5)
Methemoglobin: 1.2 % (ref 0.0–1.5)
O2 Saturation: 59.9 %
Total hemoglobin: 9.3 g/dL — ABNORMAL LOW (ref 12.0–16.0)

## 2016-03-15 MED ORDER — AMIODARONE LOAD VIA INFUSION
150.0000 mg | Freq: Once | INTRAVENOUS | Status: AC
Start: 2016-03-15 — End: 2016-03-15
  Administered 2016-03-15: 150 mg via INTRAVENOUS

## 2016-03-15 MED ORDER — ALBUMIN HUMAN 5 % IV SOLN
12.5000 g | Freq: Once | INTRAVENOUS | Status: AC
Start: 2016-03-15 — End: 2016-03-15
  Administered 2016-03-15: 12.5 g via INTRAVENOUS

## 2016-03-15 MED ORDER — AMIODARONE HCL IN DEXTROSE 360-4.14 MG/200ML-% IV SOLN
60.0000 mg/h | INTRAVENOUS | Status: AC
  Administered 2016-03-15 (×2): 60 mg/h via INTRAVENOUS

## 2016-03-15 MED ORDER — METOCLOPRAMIDE HCL 5 MG/ML IJ SOLN
10.0000 mg | Freq: Four times a day (QID) | INTRAMUSCULAR | Status: DC
  Administered 2016-03-15 – 2016-03-23 (×30): 10 mg via INTRAVENOUS
  Filled 2016-03-15 (×29): qty 2

## 2016-03-15 MED ORDER — POTASSIUM CHLORIDE 10 MEQ/50ML IV SOLN
10.0000 meq | INTRAVENOUS | Status: AC
  Administered 2016-03-15 (×3): 10 meq via INTRAVENOUS
  Filled 2016-03-15 (×3): qty 50

## 2016-03-15 MED ORDER — DOPAMINE-DEXTROSE 3.2-5 MG/ML-% IV SOLN
1.0000 ug/kg/min | INTRAVENOUS | Status: DC
  Administered 2016-03-15: 2.5 ug/kg/min via INTRAVENOUS
  Filled 2016-03-15: qty 250

## 2016-03-15 MED ORDER — AMIODARONE LOAD VIA INFUSION
150.0000 mg | Freq: Once | INTRAVENOUS | Status: AC
  Administered 2016-03-15: 150 mg via INTRAVENOUS
  Filled 2016-03-15: qty 83.34

## 2016-03-15 MED ORDER — AMIODARONE HCL IN DEXTROSE 360-4.14 MG/200ML-% IV SOLN
30.0000 mg/h | INTRAVENOUS | Status: AC
  Administered 2016-03-15 – 2016-03-16 (×4): 30 mg/h via INTRAVENOUS
  Filled 2016-03-15 (×5): qty 200

## 2016-03-15 MED ORDER — LEVALBUTEROL HCL 1.25 MG/0.5ML IN NEBU
1.2500 mg | INHALATION_SOLUTION | Freq: Three times a day (TID) | RESPIRATORY_TRACT | Status: DC
  Administered 2016-03-16 – 2016-03-19 (×9): 1.25 mg via RESPIRATORY_TRACT
  Filled 2016-03-15 (×10): qty 0.5

## 2016-03-15 MED ORDER — AMIODARONE HCL IN DEXTROSE 360-4.14 MG/200ML-% IV SOLN
INTRAVENOUS | Status: AC
  Filled 2016-03-15: qty 200

## 2016-03-15 MED ORDER — VANCOMYCIN HCL IN DEXTROSE 1-5 GM/200ML-% IV SOLN
1000.0000 mg | INTRAVENOUS | Status: AC
  Administered 2016-03-15 – 2016-03-16 (×2): 1000 mg via INTRAVENOUS
  Filled 2016-03-15 (×2): qty 200

## 2016-03-15 NOTE — Progress Notes (Signed)
2 Days Post-Op Procedure(s) (LRB): CORONARY ARTERY BYPASS GRAFTING (CABG) x 1 (SVG to OM) with EVH from [place] (N/A) VENTRICULAR SEPTAL DEFECT (VSD) REPAIR (N/A) TRANSESOPHAGEAL ECHOCARDIOGRAM (TEE) (N/A) Subjective: Patient had stable night after being extubated late yesterday cardiac index remains 2.0-2.5 Intra-aortic balloon pump has been weaned 1-3 and  was removed Neuro status intact Chest tubes with minimal drainage Adequate urine output with stable creatinine Minimal inotropic support-low-dose milrinone and renal dose dopamine Patient developed atrial fibrillation earlier this a.m. and is now on IV amiodarone    Objective: Vital signs in last 24 hours: Temp:  [99.1 F (37.3 C)-100.6 F (38.1 C)] 99.3 F (37.4 C) (09/14 1400) Pulse Rate:  [27-111] 109 (09/14 1400) Cardiac Rhythm: Atrial fibrillation (09/14 1200) Resp:  [17-39] 19 (09/14 1400) BP: (85-135)/(43-53) 120/52 (09/14 1200) SpO2:  [86 %-100 %] 92 % (09/14 1400) FiO2 (%):  [40 %] 40 % (09/13 1509) Weight:  [178 lb 5.6 oz (80.9 kg)] 178 lb 5.6 oz (80.9 kg) (09/14 0500)  Hemodynamic parameters for last 24 hours: PAP: (22-33)/(12-22) 29/18 CVP:  [14 mmHg-17 mmHg] 15 mmHg CO:  [3.9 L/min-7.1 L/min] 7.1 L/min CI:  [2.1 L/min/m2-3.9 L/min/m2] 3.9 L/min/m2  Intake/Output from previous day: 09/13 0701 - 09/14 0700 In: 3321.1 [I.V.:3021.1; IV Piggyback:300] Out: 3160 [Urine:2630; Chest Tube:530] Intake/Output this shift: Total I/O In: 707.3 [I.V.:472.3; Blood:235] Out: 760 [Urine:730; Chest Tube:30]       Exam    General- alert and comfortable   Lungs- clear without rales, wheezes   Cor- regular rate and rhythm, no murmur , gallop   Abdomen- soft, non-tender   Extremities - warm, non-tender, minimal edema   Neuro- oriented, appropriate, no focal weakness   Lab Results:  Recent Labs  03/14/16 1541 03/15/16 0432  WBC 18.2* 19.8*  HGB 10.5* 9.6*  HCT 31.0* 28.4*  PLT 175 166    BMET:  Recent Labs  03/14/16 0300 03/14/16 1539 03/14/16 1541 03/15/16 0432  NA 135 136  --  137  K 4.5 4.1  --  3.9  CL 107 101  --  105  CO2 21*  --   --  24  GLUCOSE 155* 175*  --  165*  BUN 22* 22*  --  23*  CREATININE 1.24 1.10 1.21 1.18  CALCIUM 7.7*  --   --  7.8*    PT/INR:  Recent Labs  03/15/16 0432  LABPROT 16.6*  INR 1.33   ABG    Component Value Date/Time   PHART 7.455 (H) 03/15/2016 0420   HCO3 23.8 03/15/2016 0420   TCO2 23 03/14/2016 1715   ACIDBASEDEF 2.0 03/14/2016 1715   O2SAT 59.9 03/15/2016 0430   CBG (last 3)   Recent Labs  03/15/16 0353 03/15/16 0727 03/15/16 1134  GLUCAP 141* 142* 170*    Assessment/Plan: S/P Procedure(s) (LRB): CORONARY ARTERY BYPASS GRAFTING (CABG) x 1 (SVG to OM) with EVH from [place] (N/A) VENTRICULAR SEPTAL DEFECT (VSD) REPAIR (N/A) TRANSESOPHAGEAL ECHOCARDIOGRAM (TEE) (N/A) Diuresis Diabetes control d/c tubes/lines Continue foley due to diuresing patient and urinary output monitoring Continue low-dose milrinone and dopamine for RV support   LOS: 2 days    Tharon Aquas Trigt III 03/15/2016

## 2016-03-15 NOTE — Plan of Care (Signed)
Problem: Activity: Goal: Risk for activity intolerance will decrease Outcome: Not Progressing Pt remains on bedrest due to IABP   Problem: Bowel/Gastric: Goal: Gastrointestinal status for postoperative course will improve Outcome: Progressing Tolerating ice chips. Passing flatus.  Problem: Cardiac: Goal: Hemodynamic stability will improve Outcome: Progressing Stable on current gtt's Goal: Ability to maintain an adequate cardiac output will improve Outcome: Progressing Greater than 1.8  Problem: Nutritional: Goal: Risk for body nutrition deficit will decrease Outcome: Not Progressing Diet not advanced yet.  Problem: Respiratory: Goal: Ability to tolerate decreased levels of ventilator support will improve Outcome: Completed/Met Date Met: 03/15/16 Extubated 03/14/2016

## 2016-03-15 NOTE — Op Note (Signed)
Procedure-removal of intra-aortic balloon pump  Surgeon-Peter Prescott Gum M.D.  Anesthesia-none  Diagnosis- preoperative balloon pump placement for cardiogenic shock, MI prior to CABG with repair of a post MI VSD  Procedure in detail The patient then weaned slowly from intra-aortic balloon pump support down to 1:3 counter pulsation with stable hemodynamics. I discussed the procedure of balloon pump removal with the patient and he agreed.  The right groin was prepped with CHG The sutures securing the balloon pump sheath and the femoral venous sheath were removed The balloon pump was placed on standby and the balloon was aspirated using a 60 cc syringe The balloon pump catheter was pulled back to the sheath The sheath and balloon pump were then removed together and pressure was immediately placed on the femoral artery The femoral sheath was removed simultaneously. Pressure was held on the groin by myself for 8 minutes and then further pressure was held by Lurene Shadow, cardiac cath lab technologist.

## 2016-03-15 NOTE — Progress Notes (Signed)
Patient ID: William [name], male   DOB: [birth year], 65 y.o.   MRN: [id number]  SICU Evening Rounds:  Hemodynamically stable on milrinone 0.3, dop 3. CI 3.2 IABP out today.  Rhythm is tachy 110, regular. May be atrial flutter. He is on amiodarone.  Urine output is ok with bid lasix.  CT output low.  BMET    Component Value Date/Time   NA 136 03/15/2016 1610   K 3.6 03/15/2016 1610   CL 99 (L) 03/15/2016 1610   CO2 24 03/15/2016 0432   GLUCOSE 131 (H) 03/15/2016 1610   BUN 25 (H) 03/15/2016 1610   CREATININE 1.00 03/15/2016 1610   CREATININE 0.68 07/06/2013 1051   CALCIUM 7.8 (L) 03/15/2016 0432   GFRNONAA >60 03/15/2016 0432   GFRAA >60 03/15/2016 0432   CBC    Component Value Date/Time   WBC 19.8 (H) 03/15/2016 0432   RBC 3.21 (L) 03/15/2016 0432   HGB 9.5 (L) 03/15/2016 1610   HCT 28.0 (L) 03/15/2016 1610   PLT 166 03/15/2016 0432   MCV 88.5 03/15/2016 0432   MCH 29.9 03/15/2016 0432   MCHC 33.8 03/15/2016 0432   RDW 13.6 03/15/2016 0432

## 2016-03-15 NOTE — Progress Notes (Signed)
IABP  In right groin. IABP initial removal performed by Dr.Van Tright.  Manual pressure held 5 minutes, then taken over. Manual pressure continued for additional 25 minutes for a total of 30 minutes.  Groin level 0. No s+s of hematoma. Right leg does have edema present.  Distal pulses DP and PT present by doppler.   Tegaderm dressing applied, bedrest instructions given.  Bedrest begins at 12:40:00

## 2016-03-15 NOTE — Progress Notes (Signed)
Pt in and out of Afib with rate from 110-150. 12 lead ecg obtained. Dr. Servando Snare paged and made aware of current drips, IABP 1:3, and vital signs. Order received to start amiodarone gtt with no bolus. Will continue to monitor closely. Eleonore Chiquito RN 2 [place]

## 2016-03-15 NOTE — Progress Notes (Addendum)
Patient Name: William [name] Date of Encounter: 03/15/2016    SUBJECTIVE: Awake, alert, and son and daughter-in-law are at bedside. Patient is able to carry on a conversation without significant confusion or difficulty. He denies dyspnea. He continues to have incisional pain but otherwise feels well.  TELEMETRY:  Atrial fibrillation with relatively controlled rate. Currently on IV amiodarone. Also on dopamine. Vitals:   03/15/16 0900 03/15/16 0930 03/15/16 0953 03/15/16 1000  BP: (!) 130/48 (!) 135/49    Pulse:      Resp: (!) 23 (!) 33  (!) 31  Temp: 99.1 F (37.3 C) 99.3 F (37.4 C)  99.5 F (37.5 C)  TempSrc:  Core (Comment)    SpO2: 93% 95% 97% 96%  Weight:      Height:        Intake/Output Summary (Last 24 hours) at 03/15/16 1038 Last data filed at 03/15/16 1000  Gross per 24 hour  Intake          3093.33 ml  Output             3180 ml  Net           -86.67 ml   LABS: Basic Metabolic Panel:  Recent Labs  03/14/16 0300 03/14/16 1539 03/14/16 1541 03/15/16 0432  NA 135 136  --  137  K 4.5 4.1  --  3.9  CL 107 101  --  105  CO2 21*  --   --  24  GLUCOSE 155* 175*  --  165*  BUN 22* 22*  --  23*  CREATININE 1.24 1.10 1.21 1.18  CALCIUM 7.7*  --   --  7.8*  MG 4.0*  --  2.9*  --    CBC:  Recent Labs  03/14/16 1541 03/15/16 0432  WBC 18.2* 19.8*  HGB 10.5* 9.6*  HCT 31.0* 28.4*  MCV 87.1 88.5  PLT 175 166   Cardiac Enzymes:  Recent Labs  03/13/16 1057  TROPONINI 1.51*   BNP: Invalid input(s): POCBNP Hemoglobin A1C:  Recent Labs  03/13/16 1057  HGBA1C 10.7*   Fasting Lipid Panel:  Recent Labs  03/13/16 1057  CHOL 96  HDL 23*  LDLCALC 44  TRIG 146  CHOLHDL 4.2    Radiology/Studies:  Chest x-ray 03/15/16: MPRESSION: 1. Stable low volumes after extubation. 2. New left upper extremity PICC in unremarkable position. 3. Bibasilar atelectasis. 4. No visible pneumothorax  Physical Exam: Blood pressure (!) 135/49, pulse (!)  111, temperature 99.5 F (37.5 C), resp. rate (!) 31, height 5\' 5"  (1.651 m), weight 178 lb 5.6 oz (80.9 kg), SpO2 96 %. Weight change: 1 lb 8.7 oz (0.7 kg)  Wt Readings from Last 3 Encounters:  03/15/16 178 lb 5.6 oz (80.9 kg)  03/13/16 162 lb (73.5 kg)  07/06/13 150 lb 3.2 oz (68.1 kg)   Awake, oriented, and alert. No pericardial rub is heard. Moderate hand and lower extremity edema.  ASSESSMENT:  1. Late presenting inferoposterior myocardial infarction with mechanical complication of VSD. 2. Postoperative atrial fibrillation, currently on amiodarone with only borderline rate control. 3. Hyperlipidemia 4. Diabetes mellitus, type II  Plan:  1. Continue IV amiodarone as this will eventually converted atrial fibrillation and help maintain sinus rhythm in this setting. We'll likely need to be on oral amiodarone which will be continued at a later date as an OP. 2. We will continue to follow. 3. High intensity statin therapy once taking oral medications. 4. Add Plavix  when safe. 5. If atrial fibrillation continues greater than 48 hours, we'll need to start anticoagulation therapy and avoid exposure to dual antiplatelet therapy.   Signed, Belva Crome III 03/15/2016, 10:38 AM

## 2016-03-16 ENCOUNTER — Inpatient Hospital Stay (HOSPITAL_COMMUNITY): Payer: Medicare Other

## 2016-03-16 ENCOUNTER — Encounter (HOSPITAL_COMMUNITY): Payer: Self-pay | Admitting: Cardiology

## 2016-03-16 DIAGNOSIS — I2510 Atherosclerotic heart disease of native coronary artery without angina pectoris: Secondary | ICD-10-CM

## 2016-03-16 DIAGNOSIS — Z951 Presence of aortocoronary bypass graft: Secondary | ICD-10-CM

## 2016-03-16 DIAGNOSIS — Q210 Ventricular septal defect: Secondary | ICD-10-CM

## 2016-03-16 DIAGNOSIS — I2583 Coronary atherosclerosis due to lipid rich plaque: Secondary | ICD-10-CM

## 2016-03-16 LAB — COMPREHENSIVE METABOLIC PANEL
ALT: 20 U/L (ref 17–63)
AST: 31 U/L (ref 15–41)
Albumin: 2.6 g/dL — ABNORMAL LOW (ref 3.5–5.0)
Alkaline Phosphatase: 97 U/L (ref 38–126)
Anion gap: 8 (ref 5–15)
BUN: 27 mg/dL — ABNORMAL HIGH (ref 6–20)
CO2: 24 mmol/L (ref 22–32)
Calcium: 8.1 mg/dL — ABNORMAL LOW (ref 8.9–10.3)
Chloride: 103 mmol/L (ref 101–111)
Creatinine, Ser: 1 mg/dL (ref 0.61–1.24)
GFR calc Af Amer: >60 mL/min (ref >60)
GFR calc non Af Amer: >60 mL/min (ref >60)
Glucose, Bld: 128 mg/dL — ABNORMAL HIGH (ref 65–99)
Potassium: 3.9 mmol/L (ref 3.5–5.1)
Sodium: 135 mmol/L (ref 135–145)
Total Bilirubin: 0.9 mg/dL (ref 0.3–1.2)
Total Protein: 5.1 g/dL — ABNORMAL LOW (ref 6.5–8.1)

## 2016-03-16 LAB — BASIC METABOLIC PANEL
Anion gap: 9 (ref 5–15)
BUN: 29 mg/dL — ABNORMAL HIGH (ref 6–20)
CO2: 25 mmol/L (ref 22–32)
Calcium: 8.3 mg/dL — ABNORMAL LOW (ref 8.9–10.3)
Chloride: 99 mmol/L — ABNORMAL LOW (ref 101–111)
Creatinine, Ser: 1.02 mg/dL (ref 0.61–1.24)
GFR calc Af Amer: >60 mL/min (ref >60)
GFR calc non Af Amer: >60 mL/min (ref >60)
Glucose, Bld: 114 mg/dL — ABNORMAL HIGH (ref 65–99)
Potassium: 3.9 mmol/L (ref 3.5–5.1)
Sodium: 133 mmol/L — ABNORMAL LOW (ref 135–145)

## 2016-03-16 LAB — GLUCOSE, CAPILLARY
GLUCOSE-CAPILLARY: 108 mg/dL — AB (ref 65–99)
GLUCOSE-CAPILLARY: 161 mg/dL — AB (ref 65–99)
GLUCOSE-CAPILLARY: 240 mg/dL — AB (ref 65–99)
GLUCOSE-CAPILLARY: 195 mg/dL — AB (ref 65–99)
GLUCOSE-CAPILLARY: 105 mg/dL — AB (ref 65–99)
Glucose-Capillary: 116 mg/dL — ABNORMAL HIGH (ref 65–99)
Glucose-Capillary: 178 mg/dL — ABNORMAL HIGH (ref 65–99)

## 2016-03-16 LAB — CBC
HCT: 27.8 % — ABNORMAL LOW (ref 39.0–52.0)
HCT: 29.3 % — ABNORMAL LOW (ref 39.0–52.0)
Hemoglobin: 9.1 g/dL — ABNORMAL LOW (ref 13.0–17.0)
Hemoglobin: 9.6 g/dL — ABNORMAL LOW (ref 13.0–17.0)
MCH: 29.1 pg (ref 26.0–34.0)
MCH: 29.1 pg (ref 26.0–34.0)
MCHC: 32.7 g/dL (ref 30.0–36.0)
MCHC: 32.8 g/dL (ref 30.0–36.0)
MCV: 88.8 fL (ref 78.0–100.0)
MCV: 88.8 fL (ref 78.0–100.0)
Platelets: 163 10*3/uL (ref 150–400)
Platelets: 180 10*3/uL (ref 150–400)
RBC: 3.13 MIL/uL — ABNORMAL LOW (ref 4.22–5.81)
RBC: 3.3 MIL/uL — ABNORMAL LOW (ref 4.22–5.81)
RDW: 13.6 % (ref 11.5–15.5)
RDW: 13.6 % (ref 11.5–15.5)
WBC: 21.4 10*3/uL — ABNORMAL HIGH (ref 4.0–10.5)
WBC: 22.6 10*3/uL — ABNORMAL HIGH (ref 4.0–10.5)

## 2016-03-16 LAB — CULTURE, RESPIRATORY W GRAM STAIN: Special Requests: NORMAL

## 2016-03-16 LAB — BLOOD GAS, ARTERIAL
Acid-Base Excess: 1.4 mmol/L (ref 0.0–2.0)
Bicarbonate: 25.3 mmol/L (ref 20.0–28.0)
O2 Content: 4 L/min
O2 Saturation: 93.2 %
Patient temperature: 98.6
pCO2 arterial: 39.2 mmHg (ref 32.0–48.0)
pH, Arterial: 7.427 (ref 7.350–7.450)
pO2, Arterial: 71.2 mmHg — ABNORMAL LOW (ref 83.0–108.0)

## 2016-03-16 LAB — CARBOXYHEMOGLOBIN
Carboxyhemoglobin: 0.7 % (ref 0.5–1.5)
Methemoglobin: 0.9 % (ref 0.0–1.5)
O2 Saturation: 58.7 %
Total hemoglobin: 15.7 g/dL (ref 12.0–16.0)

## 2016-03-16 LAB — PREPARE FRESH FROZEN PLASMA: Unit division: 0

## 2016-03-16 MED ORDER — DIGOXIN 125 MCG PO TABS
0.1250 mg | ORAL_TABLET | Freq: Every day | ORAL | Status: DC
  Administered 2016-03-16 – 2016-04-03 (×19): 0.125 mg via ORAL
  Filled 2016-03-16 (×19): qty 1

## 2016-03-16 MED ORDER — ALTEPLASE 2 MG IJ SOLR
2.0000 mg | Freq: Once | INTRAMUSCULAR | Status: AC
  Administered 2016-03-16: 2 mg

## 2016-03-16 MED ORDER — MILRINONE LACTATE IN DEXTROSE 20-5 MG/100ML-% IV SOLN
0.1250 ug/kg/min | INTRAVENOUS | Status: DC
  Administered 2016-03-16: 0.25 ug/kg/min via INTRAVENOUS
  Administered 2016-03-19 – 2016-03-20 (×3): 0.125 ug/kg/min via INTRAVENOUS
  Administered 2016-03-21 – 2016-03-26 (×8): 0.25 ug/kg/min via INTRAVENOUS
  Administered 2016-03-28 – 2016-03-30 (×2): 0.125 ug/kg/min via INTRAVENOUS
  Filled 2016-03-16 (×17): qty 100

## 2016-03-16 MED ORDER — DEXTROSE 5 % IV SOLN
1.0000 g | Freq: Three times a day (TID) | INTRAVENOUS | Status: DC
  Administered 2016-03-16 – 2016-03-17 (×4): 1 g via INTRAVENOUS
  Filled 2016-03-16 (×6): qty 1

## 2016-03-16 MED ORDER — POTASSIUM CHLORIDE 10 MEQ/50ML IV SOLN
10.0000 meq | INTRAVENOUS | Status: AC
  Administered 2016-03-16 (×2): 10 meq via INTRAVENOUS
  Filled 2016-03-16: qty 50

## 2016-03-16 MED ORDER — LIVING WELL WITH DIABETES BOOK
Freq: Once | Status: AC
  Administered 2016-03-16: 16:00:00
  Filled 2016-03-16: qty 1

## 2016-03-16 MED ORDER — ENOXAPARIN SODIUM 40 MG/0.4ML ~~LOC~~ SOLN
40.0000 mg | SUBCUTANEOUS | Status: DC
  Administered 2016-03-16 – 2016-04-01 (×17): 40 mg via SUBCUTANEOUS
  Filled 2016-03-16 (×17): qty 0.4

## 2016-03-16 MED ORDER — METOPROLOL TARTRATE 12.5 MG HALF TABLET
12.5000 mg | ORAL_TABLET | Freq: Every morning | ORAL | Status: DC
  Administered 2016-03-18: 12.5 mg via ORAL
  Filled 2016-03-16 (×2): qty 1

## 2016-03-16 MED ORDER — BISACODYL 10 MG RE SUPP
10.0000 mg | Freq: Once | RECTAL | Status: AC
  Administered 2016-03-17: 10 mg via RECTAL
  Filled 2016-03-16: qty 1

## 2016-03-16 MED ORDER — AMIODARONE HCL 200 MG PO TABS
200.0000 mg | ORAL_TABLET | Freq: Two times a day (BID) | ORAL | Status: DC
  Administered 2016-03-16 – 2016-03-27 (×22): 200 mg via ORAL
  Filled 2016-03-16 (×22): qty 1

## 2016-03-16 NOTE — Progress Notes (Signed)
3 Days Post-Op Procedure(s) (LRB): CORONARY ARTERY BYPASS GRAFTING (CABG) x 1 (SVG to OM) with EVH from [place] (N/A) VENTRICULAR SEPTAL DEFECT (VSD) REPAIR (N/A) TRANSESOPHAGEAL ECHOCARDIOGRAM (TEE) (N/A) Subjective: Patient in sinus tachycardia on IV amiodarone Chest tube drainage is minimal and will remove both tubes today Co-ox this a.m. remains marginal at 58%-continue milrinone Chest x-ray with interstitial edema but improving volumes-continue IV Lasix Blood sugars controlled with Lantus plus sliding scale Low-grade temperature with white count 21,000-continue IV Fortaz for possible pneumonia Objective: Vital signs in last 24 hours: Temp:  [98.4 F (36.9 C)-100.2 F (37.9 C)] 98.6 F (37 C) (09/15 0900) Pulse Rate:  [27-120] 105 (09/15 0945) Cardiac Rhythm: Sinus tachycardia (09/15 0800) Resp:  [13-27] 20 (09/15 0945) BP: (95-132)/(51-72) 112/70 (09/15 0945) SpO2:  [92 %-98 %] 95 % (09/15 0945) Weight:  [183 lb 6.8 oz (83.2 kg)] 183 lb 6.8 oz (83.2 kg) (09/15 0600)  Hemodynamic parameters for last 24 hours: PAP: (20-46)/(8-33) 22/9 CVP:  [15 mmHg-21 mmHg] 17 mmHg CO:  [5.3 L/min-6.3 L/min] 5.9 L/min CI:  [3.1 L/min/m2-3.5 L/min/m2] 3.3 L/min/m2  Intake/Output from previous day: 09/14 0701 - 09/15 0700 In: 2450.9 [P.O.:840; I.V.:1225.9; Blood:235; IV Piggyback:150] Out: 2160 [Urine:1780; Chest Tube:380] Intake/Output this shift: Total I/O In: 365.9 [I.V.:115.9; IV Piggyback:250] Out: 325 [Urine:325]       Exam    General- alert and comfortable   Lungs- clear without rales, wheezes   Cor- regular rate and rhythm, no murmur , gallop   Abdomen- soft, non-tender   Extremities - warm, non-tender, minimal edema   Neuro- oriented, appropriate, no focal weakness   Lab Results:  Recent Labs  03/15/16 0432 03/15/16 1610 03/16/16 0400  WBC 19.8*  --  21.4*  HGB 9.6* 9.5* 9.1*  HCT 28.4* 28.0* 27.8*  PLT 166  --  163   BMET:  Recent Labs  03/15/16 0432 03/15/16 1610 03/16/16 0400  NA 137 136 135  K 3.9 3.6 3.9  CL 105 99* 103  CO2 24  --  24  GLUCOSE 165* 131* 128*  BUN 23* 25* 27*  CREATININE 1.18 1.00 1.00  CALCIUM 7.8*  --  8.1*    PT/INR:  Recent Labs  03/15/16 0432  LABPROT 16.6*  INR 1.33   ABG    Component Value Date/Time   PHART 7.427 03/16/2016 0420   HCO3 25.3 03/16/2016 0420   TCO2 23 03/15/2016 1610   ACIDBASEDEF 2.0 03/14/2016 1715   O2SAT 93.2 03/16/2016 0420   CBG (last 3)   Recent Labs  03/16/16 0003 03/16/16 0418 03/16/16 0906  GLUCAP 178* 116* 108*    Assessment/Plan: S/P Procedure(s) (LRB): CORONARY ARTERY BYPASS GRAFTING (CABG) x 1 (SVG to OM) with EVH from [place] (N/A) VENTRICULAR SEPTAL DEFECT (VSD) REPAIR (N/A) TRANSESOPHAGEAL ECHOCARDIOGRAM (TEE) (N/A) Mobilize, diuresis, Continue IV amiodarone and milrinone Continue current care   LOS: 3 days    William [name] 03/16/2016

## 2016-03-16 NOTE — Care Management Note (Signed)
Case Management Note  Patient Details  Name: William [name] MRN: [id number] Date of Birth: [birth year]  Subjective/Objective:    Pt lives with family, has cane, walker, and wheelchair.  Spouse indicates that family has arranged to provide 24/7 assistance when pt is medically stable for discharge.                        Expected Discharge Plan:  [place]  Discharge planning Services  CM Consult  Status of Service:  In process, will continue to follow  Girard Cooter, RN 03/16/2016, 10:44 AM

## 2016-03-16 NOTE — Progress Notes (Signed)
       Patient Name: William [name] Date of Encounter: 03/16/2016    SUBJECTIVE: The patient is doing well. He is sitting at the bedside in the chair. He denies dyspnea. Increasing incisional pain.  TELEMETRY:  Sinus rhythm/sinus tachycardia with PACs and PVCs. He is on an amiodarone drip Vitals:   03/16/16 1100 03/16/16 1115 03/16/16 1130 03/16/16 1145  BP: 94/61 91/61 111/71 97/69  Pulse: (!) 106 (!) 107 (!) 114 (!) 110  Resp: (!) 22 17 (!) 23 (!) 21  Temp:    99 F (37.2 C)  TempSrc:      SpO2: 96% 96% 98% 97%  Weight:      Height:        Intake/Output Summary (Last 24 hours) at 03/16/16 1301 Last data filed at 03/16/16 1134  Gross per 24 hour  Intake          2159.12 ml  Output             1960 ml  Net           199.12 ml   LABS: Basic Metabolic Panel:  Recent Labs  03/14/16 0300  03/14/16 1541 03/15/16 0432 03/15/16 1610 03/16/16 0400  NA 135  < >  --  137 136 135  K 4.5  < >  --  3.9 3.6 3.9  CL 107  < >  --  105 99* 103  CO2 21*  --   --  24  --  24  GLUCOSE 155*  < >  --  165* 131* 128*  BUN 22*  < >  --  23* 25* 27*  CREATININE 1.24  < > 1.21 1.18 1.00 1.00  CALCIUM 7.7*  --   --  7.8*  --  8.1*  MG 4.0*  --  2.9*  --   --   --   < > = values in this interval not displayed. CBC:  Recent Labs  03/15/16 0432 03/15/16 1610 03/16/16 0400  WBC 19.8*  --  21.4*  HGB 9.6* 9.5* 9.1*  HCT 28.4* 28.0* 27.8*  MCV 88.5  --  88.8  PLT 166  --  163    Radiology/Studies:  Chest x-ray reveals continued mild pulmonary edema but improving.  Physical Exam: Blood pressure 97/69, pulse (!) 110, temperature 99 F (37.2 C), resp. rate (!) 21, height 5\' 5"  (1.651 m), weight 183 lb 6.8 oz (83.2 kg), SpO2 97 %. Weight change: 5 lb 1.1 oz (2.3 kg)  Wt Readings from Last 3 Encounters:  03/16/16 183 lb 6.8 oz (83.2 kg)  03/13/16 162 lb (73.5 kg)  07/06/13 150 lb 3.2 oz (68.1 kg)  Sinus tachycardia No pericardial rub Chest is clear  ASSESSMENT:  1. Right  artery disease status post single-vessel bypass 2. Successful surgical closure of postinfarct VSD 3. Postoperative atrial fibrillation. Now in sinus rhythm on IV amiodarone 4. History of hypertension 5. History of hyperlipidemia  Plan:  1. Beta blocker therapy 2. High intensity statin therapy 3. Aspirin therapy and add Plavix prior to discharge 4. IV amiodarone to be converted to oral amiodarone 200 mg twice a day for 2 weeks then 200 mg daily thereafter. Medication will eventually be stopped as an outpatient once healed up from surgery and infarction. Since back in sinus rhythm, there is no indication for systemic anticoagulation at this point. We will have to see how the hospital course progresses.  Signed, Belva Crome III 03/16/2016, 1:01 PM

## 2016-03-16 NOTE — Consult Note (Addendum)
[place] Nurse wound consult note Reason for Consult: necrotic toe Wound type: Right great toe has a DTI that encircles the entire toe in a band that is 4cm wide with some areas of peeling skin as well as an unstageable wound on medial aspect of the base of the great toe. Pressure Ulcer POA: No, this wound not due to pressure Measurement:3cm x 2.5cm x 0.5 Wound bed: eschar, black Drainage (amount, consistency, odor) no drainage, moderate odor Periwound: circle of black skin (suspected DTI), around the circumference of the toe Dressing procedure/placement/frequency: MD has order for painting with betadine and dry kling, will continue this. We will not follow, but will remain available to this patient, to nursing, and the medical and/or surgical teams.  Please re-consult if we need to assist further.   Fara Olden, RN-C, WTA-C Wound Treatment Associate

## 2016-03-16 NOTE — Care Management Important Message (Signed)
Important Message  Patient Details  Name: William [name] MRN: [id number] Date of Birth: 04/06/1951   Medicare Important Message Given:  Yes    [name] Abena 03/16/2016, 11:24 AM

## 2016-03-16 NOTE — Plan of Care (Signed)
Problem: Activity: Goal: Risk for activity intolerance will decrease Outcome: Progressing Pt. Dangled on edge of bed. To be out of bed this morning once William [name] pulled.

## 2016-03-16 NOTE — Progress Notes (Signed)
Inpatient Diabetes Program Recommendations  AACE/ADA: New Consensus Statement on Inpatient Glycemic Control (2015)  Target Ranges:  Prepandial:   less than 140 mg/dL      Peak postprandial:   less than 180 mg/dL (1-2 hours)      Critically ill patients:  140 - 180 mg/dL   Lab Results  Component Value Date   GLUCAP 161 (H) 03/16/2016   HGBA1C 10.7 (H) 03/13/2016    Spoke with patient about diabetes and home regimen for diabetes control. Patient reports that he is followed by his PCP for diabetes management and last saw him this past Monday and had his "heart attack" on Tuesday. Patient reports that he is taking insulin as prescribed. Patient reports that his doctor increased his insulin to 50 units QAM and 40 units QPM. Patient states that his daughter helps him with his meds and she checks his sugar twice a day in the am and at night.  Inquired about prior A1C and patient reports that his doctor did not tell him anything about an A1c and does not know what it is. Discussed A1C results (10.7% on 03/13/16). Discussed glucose and A1C goals. Explained how hyperglycemia leads to damage within blood vessels which lead to the common complications seen with uncontrolled diabetes. Stressed to the patient the importance of improving glycemic control to prevent further complications from uncontrolled diabetes. Discussed impact of nutrition, exercise, stress, sickness, and medications on diabetes control. Patient states his wife and daughter will keep an eye on him and fix his meals. Encouraged patient to alternate his second glucose check. Spoke to patient about discussing with his doctor a tight plan for glucose control.  Patient verbalized understanding of information discussed and he states that he has no further questions at this time related to diabetes.  Thanks, Tama Headings RN, MSN, [facility] Inpatient Diabetes Coordinator Team Pager [phone] (8a-5p)

## 2016-03-16 NOTE — Progress Notes (Signed)
CT surgery p.m. Rounds  Patient recovering after emergency repair of post MI VSD Chest tubes removed today Patient name related hallway Abdomen still mildly distended without BM on full liquids Sinus tachycardia on amiodarone Requires low-dose dopamine to maintain blood pressure greater than 90

## 2016-03-16 NOTE — Discharge Summary (Signed)
Physician Discharge Summary       [address]       [place] [address]             [phone]    Patient ID: William [name] MRN: [id number] DOB/AGE: [birth year] 65 y.o.  Admit date: 03/13/2016 Discharge date: 04/03/2016  Admission Diagnoses: 1. Acute MI, inferoposterior wall, initial episode of care (William [name]) 2. Cardiogenic shock (William [name]) 3.  VSD (ventricular septal defect) 4 Acute systolic Heart Failure  Active Diagnoses:  1. DM (diabetes mellitus), type 2, uncontrolled (William [name]) 2. Hyperlipidemia 3. Paroxysmal atrial fibrillation ([name]) 4. Gangrene right great toe extending to the second toe 5. ABL anemia 6. Acute systolic heart failure     Consults: Dr. Haroldine Laws (heart failure) and Dr. Sharol Given, and wound care  Procedure (s):  1.Emergency coronary artery bypass grafting x1 (saphenous vein graft     to obtuse marginal). 2. Emergency repair of post MI inferior septal VSD by Dr. Prescott Gum on 03/13/2016.  Removal of IABP by Dr. Prescott Gum on 03/15/2016  1st and 2nd Ray Amputation Right Foot Local tissue rearrangement for wound closure 8 x 4 cm. Application of Prevena wound VAC by Dr. Sharol Given on 03/23/2016  History of Presenting Illness: This is a 65 year old Caucasian male diabetic smoker with severe shoulder pain 4 days associated with shortness of breath. Two weeks ago the patient was evaluated by podiatry for diabetic ulcer on his right great toe. He was noted to have no palpable pedal pulses and was referred to Dr. Sophronia Simas for evaluation of his peripheral vascular circulation. ABIs were abnormal with evidence of at least 75% stenosis of the right SFA. Left pedal ABIs were 0.8. The patient presented for evaluation at Dr. Jacklynn Ganong clinic he is found to be tachycardic, blood pressure was not recorded. A 12 EKG at that time showed acute  MI and he was transferred to [name] for urgent catheterization. Cardiac catheterization demonstrated a large dominant right coronary with  total occlusion just distal to the RV marginal branch. LAD had mild-moderate disease and circumflex had moderate stenosis. There is evidence of VSD with contrast entering the right ventricle with a LV gram. Echocardiogram was performed showing mild MR and TR and a posterior VSD. The patient required norepinephrine drip for shock in the cath lab and  a balloon pump was placed. CT surgical consultation was requested. Dr. Prescott Gum discussed the need for emergent coronary artery bypass grafting surgery and repair of VSD. This was done on 03/13/2016.   Brief [name] Course:  The patient was extubated the evening of surgery without difficulty. He remained afebrile and hemodynamically stable. He was weaned off of  Amiodarone drip. He had an a prolonged ICU stay. Gordy Councilman, a line, and foley were removed early in the post operative course. Chest tubes remained for few days and then were removed on 03/16/2016. IABP was removed on 03/16/2016. He developed a low grade fever with elevated WBC. He was put on William [name] for possible pneumonia. William [name] was started and titrated accordingly. He was put on William [name] and his INR was monitored daily. His last INR was 2.14. He will be given William [name] 2.5 mg tonight and then 5 mg daily starting 04/04/2016. Home Health is to draw his PT and INR on Thursday 04/05/2016. He was volume over loaded and diuresed. He had ABL anemia. He did not require a post op transfusion. He was weaned off the insulin drip. The patient's HGA1C pre op was 5.8. An orthopedic consult  was obtained with Dr. Sharol Given secondary to gangrenous right great toe and cellulitis of right forefoot. Patient underwent first and second ray amputation, local tissue rearrangement, and application of Prevena wound vac on 03/23/2016. A heart failure consult was obtained on 03/21/2016.  He was put on William [name] and William [name] drips. He was gradually weaned off William [name] as his Co ox continued to improve. William [name] drip was stopped and he was  put on daily William [name]. He is to follow up with the heart failure clinic next week. His creatinine was elveated post op. His last creatinine was 1.41. The patient was felt surgically stable for transfer from the ICU to [facility] for further convalescence on 03/31/2016. He continues to progress with cardiac rehab. He was ambulating on room air. He has been tolerating a diet and has had a bowel movement. Epicardial pacing wires and chest tube sutures will be removed prior to discharge. The patient is felt surgically stable for discharge today.   Latest Vital Signs: Blood pressure (!) 99/55, pulse 91, temperature 97.7 F (36.5 C), temperature source Oral, resp. rate 18, height 5\' 5"  (1.651 m), weight 150 lb 1.6 oz (68.1 kg), SpO2 99 %.  Physical Exam: General- alert and comfortable   Lungs- clear without rales, wheezes   Cor- regular rate and rhythm, no murmur , gallop   Abdomen- soft, non-tender   Extremities - warm, non-tender, minimal edema   Neuro- oriented, appropriate, no focal weakness  Discharge Condition:Stable and discharged to home.  Recent laboratory studies:  Lab Results  Component Value Date   WBC 7.6 04/01/2016   HGB 8.9 (L) 04/01/2016   HCT 27.8 (L) 04/01/2016   MCV 88.0 04/01/2016   PLT 327 04/01/2016   Lab Results  Component Value Date   NA 136 04/02/2016   K 4.2 04/02/2016   CL 98 (L) 04/02/2016   CO2 29 04/02/2016   CREATININE 1.41 (H) 04/02/2016   GLUCOSE 106 (H) 04/02/2016    Diagnostic Studies:  CLINICAL DATA:  CABG 1 week ago  EXAM: PORTABLE CHEST 1 VIEW  COMPARISON:  03/20/2016  FINDINGS: Upper normal heart size. Left upper extremity PICC is stable. Bibasilar hazy airspace disease has improved. It remains right greater than left. Tiny left pleural effusion unchanged. No pneumothorax. Stable mild vascular congestion. No sign of interstitial edema.  IMPRESSION: Improved bibasilar airspace disease.  Stable tiny left effusion.   Electronically  Signed   By: Marybelle Killings M.D.   On: 03/22/2016 07:37  Dg Foot Complete Right  Result Date: 03/01/2016 X-ray examination right foot dated 02/29/2016 Intact bony structures without fracture and/or dislocation Soft tissue outlines ulcer medial right hallux without any increase in the soft tissue density in the skin ulcer area No evidence of bony changes in around the ulcer on the medial right hallux Radiographic impression: No acute bony abnormality noted in the right foot with particular attention to the medial right hallux with the overlying skin ulcer  Discharge Instructions    Amb Referral to Cardiac Rehabilitation    Complete by:  As directed    Diagnosis:   STEMI CABG     CABG X ___:  1   Change dressing    Complete by:  As directed    Wash right foot with soap and water daily and apply dry dressing daily.   Neg Press Wound Therapy / Incisional    Complete by:  As directed    Discontinue the wound VAC and dressing when the pump alarms and stops  working. May start daily Dial soap cleansing and dry dressing changes at that time.   Non weight bearing    Complete by:  As directed    Laterality:  right   Extremity:  Lower   Post-op shoe    Complete by:  As directed    Touch down weight bearing    Complete by:  As directed    Laterality:  right   Extremity:  Lower     Discharge Medications:   Medication List    STOP taking these medications   atorvastatin 20 MG tablet Commonly known as:  LIPITOR   ibuprofen 200 MG tablet Commonly known as:  ADVIL,MOTRIN   INVOKANA 300 MG Tabs tablet Generic drug:  canagliflozin   lisinopril 10 MG tablet Commonly known as:  PRINIVIL,ZESTRIL   meloxicam 7.5 MG tablet Commonly known as:  MOBIC   metFORMIN 1000 MG tablet Commonly known as:  GLUCOPHAGE   omeprazole 40 MG capsule Commonly known as:  PRILOSEC     TAKE these medications   aspirin 81 MG EC tablet Take 1 tablet (81 mg total) by mouth daily.   digoxin 0.125 MG  tablet Commonly known as:  LANOXIN Take 1 tablet (0.125 mg total) by mouth daily.   insulin detemir 100 UNIT/ML injection Commonly known as:  LEVEMIR Inject 40-50 Units into the skin 2 (two) times daily. Take 50 every morning  Take 40 units every evening   midodrine 5 MG tablet Commonly known as:  PROAMATINE Take 1 tablet (5 mg total) by mouth 3 (three) times daily with meals.   nicotine 14 mg/24hr patch Commonly known as:  NICODERM CQ - dosed in mg/24 hours Place 1 patch (14 mg total) onto the skin at bedtime.   oxyCODONE 5 MG immediate release tablet Commonly known as:  Oxy IR/ROXICODONE Take 1 tablet (5 mg total) by mouth every 4 (four) hours as needed for severe pain.   pantoprazole 40 MG tablet Commonly known as:  PROTONIX Take 40 mg by mouth daily.   pramipexole 0.125 MG tablet Commonly known as:  MIRAPEX Take 0.125 mg by mouth at bedtime.   rosuvastatin 40 MG tablet Commonly known as:  CRESTOR Take 1 tablet (40 mg total) by mouth daily.   spironolactone 25 MG tablet Commonly known as:  ALDACTONE Take 1 tablet (25 mg total) by mouth daily.   William [name] 20 MG tablet Commonly known as:  DEMADEX Take 1 tablet (20 mg total) by mouth daily.   warfarin 5 MG tablet Commonly known as:  William [name] Take 1 tablet (5 mg total) by mouth one time only at 6 PM. Or as directed     The patient has been discharged on:   1.Beta Blocker:  Yes [ x  ]                              No   [   ]                              If No, reason:  2.Ace Inhibitor/ARB: Yes [   ]                                     No  [  x  ]  If No, reason: Elevated creatinine  3.Statin:   Yes [ x  ]                  No  [   ]                  If No, reason:  4.Ecasa:  Yes  [ x  ]                  No   [   ]                  If No, reason:  Follow Up Appointments: Follow-up Information    [name], MD .   Specialty:  Cardiology Why:  Call for a follow up  appointment for 2 weeks Contact information: [address] [place] [address] [phone]        Tharon Aquas Kerby Less III, MD Follow up on 04/18/2016.   Specialty:  Cardiothoracic Surgery Why:  PA/LAT CXR to be taken (at [facility] which is in the same building as Dr. Lucianne Lei Trigt's office) on 04/18/2016 at 11:30 am ;Appointment time is at 12:00 pm Contact information: [place] [address] [place], MD Follow up in 1 week(s).   Specialty:  Orthopedic Surgery Contact information: [place] [address] [phone]        Glori Bickers, MD Follow up on 04/09/2016.   Specialty:  Cardiology Why:  at 1020 am for post [name] follow up. Please bring all of your medications to your visit. The code for parking is 4000.  Contact information: [place] [address] [place] .   Why:  HHRN/PT arranged- they will call you to set up home visits Contact information: [place] [address] [phone]        Edwin A Avbuere, MD .   Specialty:  Internal Medicine Why:  Call regarding further diabetes management and surveillance of HGA1C 10.7 Contact information: [address] [place] [address] [phone]        Inc[facility] - Dme [facility] .   Why:  3n1, shower chair and w/c arranged- to be delivered to room prior to discharge.  Contact information: [address] [place] [address] [phone]        Home Health .   Why:  Please draw a PT and INR (as on William [name], INR goal 2-2.5) on Thursday October 5th and call of fax results to Dr. Tyrell Antonio office          Signed: Lars Pinks MPA-C 04/03/2016, 1:13 PM

## 2016-03-17 ENCOUNTER — Inpatient Hospital Stay (HOSPITAL_COMMUNITY): Payer: Medicare Other

## 2016-03-17 LAB — POCT I-STAT, CHEM 8
BUN: 26 mg/dL — ABNORMAL HIGH (ref 6–20)
CALCIUM ION: 1.12 mmol/L — AB (ref 1.15–1.40)
Chloride: 95 mmol/L — ABNORMAL LOW (ref 101–111)
Creatinine, Ser: 0.9 mg/dL (ref 0.61–1.24)
GLUCOSE: 162 mg/dL — AB (ref 65–99)
HCT: 28 % — ABNORMAL LOW (ref 39.0–52.0)
HEMOGLOBIN: 9.5 g/dL — AB (ref 13.0–17.0)
Potassium: 4.3 mmol/L (ref 3.5–5.1)
SODIUM: 132 mmol/L — AB (ref 135–145)
TCO2: 25 mmol/L (ref 0–100)

## 2016-03-17 LAB — CBC
HCT: 28.6 % — ABNORMAL LOW (ref 39.0–52.0)
Hemoglobin: 9.3 g/dL — ABNORMAL LOW (ref 13.0–17.0)
MCH: 29.1 pg (ref 26.0–34.0)
MCHC: 32.5 g/dL (ref 30.0–36.0)
MCV: 89.4 fL (ref 78.0–100.0)
Platelets: 202 10*3/uL (ref 150–400)
RBC: 3.2 MIL/uL — ABNORMAL LOW (ref 4.22–5.81)
RDW: 13.6 % (ref 11.5–15.5)
WBC: 18 10*3/uL — ABNORMAL HIGH (ref 4.0–10.5)

## 2016-03-17 LAB — TYPE AND SCREEN
ABO/RH(D): O POS
ANTIBODY SCREEN: NEGATIVE
UNIT DIVISION: 0
UNIT DIVISION: 0
UNIT DIVISION: 0
UNIT DIVISION: 0
Unit division: 0
Unit division: 0

## 2016-03-17 LAB — COMPREHENSIVE METABOLIC PANEL
ALT: 18 U/L (ref 17–63)
AST: 25 U/L (ref 15–41)
Albumin: 2.4 g/dL — ABNORMAL LOW (ref 3.5–5.0)
Alkaline Phosphatase: 134 U/L — ABNORMAL HIGH (ref 38–126)
Anion gap: 9 (ref 5–15)
BUN: 29 mg/dL — ABNORMAL HIGH (ref 6–20)
CO2: 27 mmol/L (ref 22–32)
Calcium: 8.5 mg/dL — ABNORMAL LOW (ref 8.9–10.3)
Chloride: 99 mmol/L — ABNORMAL LOW (ref 101–111)
Creatinine, Ser: 0.93 mg/dL (ref 0.61–1.24)
GFR calc Af Amer: >60 mL/min (ref >60)
GFR calc non Af Amer: >60 mL/min (ref >60)
Glucose, Bld: 52 mg/dL — ABNORMAL LOW (ref 65–99)
Potassium: 3.6 mmol/L (ref 3.5–5.1)
Sodium: 135 mmol/L (ref 135–145)
Total Bilirubin: 0.6 mg/dL (ref 0.3–1.2)
Total Protein: 5.2 g/dL — ABNORMAL LOW (ref 6.5–8.1)

## 2016-03-17 LAB — GLUCOSE, CAPILLARY
GLUCOSE-CAPILLARY: 51 mg/dL — AB (ref 65–99)
GLUCOSE-CAPILLARY: 68 mg/dL (ref 65–99)
GLUCOSE-CAPILLARY: 161 mg/dL — AB (ref 65–99)
Glucose-Capillary: 52 mg/dL — ABNORMAL LOW (ref 65–99)
Glucose-Capillary: 46 mg/dL — ABNORMAL LOW (ref 65–99)
Glucose-Capillary: 96 mg/dL (ref 65–99)
Glucose-Capillary: 148 mg/dL — ABNORMAL HIGH (ref 65–99)
Glucose-Capillary: 163 mg/dL — ABNORMAL HIGH (ref 65–99)
Glucose-Capillary: 277 mg/dL — ABNORMAL HIGH (ref 65–99)

## 2016-03-17 LAB — CARBOXYHEMOGLOBIN
CARBOXYHEMOGLOBIN: 1 % (ref 0.5–1.5)
Methemoglobin: 1 % (ref 0.0–1.5)
O2 Saturation: 70.6 %
Total hemoglobin: 9.5 g/dL — ABNORMAL LOW (ref 12.0–16.0)

## 2016-03-17 MED ORDER — POTASSIUM CHLORIDE 10 MEQ/50ML IV SOLN
10.0000 meq | INTRAVENOUS | Status: AC
  Administered 2016-03-17 (×3): 10 meq via INTRAVENOUS
  Filled 2016-03-17 (×2): qty 50

## 2016-03-17 MED ORDER — ALTEPLASE 2 MG IJ SOLR
2.0000 mg | Freq: Once | INTRAMUSCULAR | Status: AC
Start: 2016-03-17 — End: 2016-03-17
  Administered 2016-03-17: 2 mg
  Filled 2016-03-17: qty 2

## 2016-03-17 MED ORDER — INSULIN ASPART 100 UNIT/ML ~~LOC~~ SOLN
0.0000 [IU] | Freq: Every day | SUBCUTANEOUS | Status: DC
  Administered 2016-03-17: 3 [IU] via SUBCUTANEOUS
  Administered 2016-03-22: 5 [IU] via SUBCUTANEOUS
  Administered 2016-03-23: 2 [IU] via SUBCUTANEOUS
  Administered 2016-03-24: 4 [IU] via SUBCUTANEOUS
  Administered 2016-03-25 – 2016-03-27 (×3): 2 [IU] via SUBCUTANEOUS

## 2016-03-17 MED ORDER — INSULIN ASPART 100 UNIT/ML ~~LOC~~ SOLN
0.0000 [IU] | Freq: Three times a day (TID) | SUBCUTANEOUS | Status: DC
  Administered 2016-03-17 – 2016-03-18 (×2): 3 [IU] via SUBCUTANEOUS
  Administered 2016-03-18: 2 [IU] via SUBCUTANEOUS
  Administered 2016-03-18: 3 [IU] via SUBCUTANEOUS
  Administered 2016-03-20: 5 [IU] via SUBCUTANEOUS
  Administered 2016-03-21: 2 [IU] via SUBCUTANEOUS
  Administered 2016-03-21: 3 [IU] via SUBCUTANEOUS
  Administered 2016-03-22: 2 [IU] via SUBCUTANEOUS
  Administered 2016-03-22: 3 [IU] via SUBCUTANEOUS
  Administered 2016-03-22: 5 [IU] via SUBCUTANEOUS
  Administered 2016-03-23: 3 [IU] via SUBCUTANEOUS
  Administered 2016-03-23: 2 [IU] via SUBCUTANEOUS
  Administered 2016-03-23 – 2016-03-24 (×2): 3 [IU] via SUBCUTANEOUS
  Administered 2016-03-24: 2 [IU] via SUBCUTANEOUS
  Administered 2016-03-24 – 2016-03-25 (×3): 5 [IU] via SUBCUTANEOUS
  Administered 2016-03-25: 3 [IU] via SUBCUTANEOUS
  Administered 2016-03-26: 2 [IU] via SUBCUTANEOUS
  Administered 2016-03-26: 8 [IU] via SUBCUTANEOUS
  Administered 2016-03-26 – 2016-03-27 (×3): 3 [IU] via SUBCUTANEOUS
  Administered 2016-03-27: 5 [IU] via SUBCUTANEOUS
  Administered 2016-03-28: 2 [IU] via SUBCUTANEOUS
  Administered 2016-03-28: 3 [IU] via SUBCUTANEOUS
  Administered 2016-03-28: 5 [IU] via SUBCUTANEOUS
  Administered 2016-03-29: 2 [IU] via SUBCUTANEOUS
  Administered 2016-03-29 (×2): 5 [IU] via SUBCUTANEOUS
  Administered 2016-03-30: 8 [IU] via SUBCUTANEOUS
  Administered 2016-03-30 – 2016-03-31 (×3): 3 [IU] via SUBCUTANEOUS
  Administered 2016-03-31: 11 [IU] via SUBCUTANEOUS
  Administered 2016-04-01: 5 [IU] via SUBCUTANEOUS
  Administered 2016-04-01: 2 [IU] via SUBCUTANEOUS
  Administered 2016-04-01 – 2016-04-02 (×3): 5 [IU] via SUBCUTANEOUS
  Administered 2016-04-03: 2 [IU] via SUBCUTANEOUS

## 2016-03-17 MED ORDER — INSULIN DETEMIR 100 UNIT/ML ~~LOC~~ SOLN
8.0000 [IU] | Freq: Every day | SUBCUTANEOUS | Status: DC
  Filled 2016-03-17: qty 0.08

## 2016-03-17 MED ORDER — DEXTROSE 5 % IV SOLN
1.0000 g | Freq: Two times a day (BID) | INTRAVENOUS | Status: DC
  Administered 2016-03-17 – 2016-03-27 (×22): 1 g via INTRAVENOUS
  Filled 2016-03-17 (×25): qty 1

## 2016-03-17 NOTE — Progress Notes (Signed)
4 Days Post-Op Procedure(s) (LRB): CORONARY ARTERY BYPASS GRAFTING (CABG) x 1 (SVG to OM) with EVH from LEFT GREATER SAPHENOUS VEIN (N/A) VENTRICULAR SEPTAL DEFECT (VSD) REPAIR (N/A) TRANSESOPHAGEAL ECHOCARDIOGRAM (TEE) (N/A) Subjective: Walking 100 ft  Unable to wean dopamine + small BM Sputum with Pseudomonas HCAP Sensitive to Maxepime Objective: Vital signs in last 24 hours: Temp:  [97.6 F (36.4 C)-99 F (37.2 C)] 97.8 F (36.6 C) (09/16 0946) Pulse Rate:  [90-120] 116 (09/16 0800) Cardiac Rhythm: Sinus tachycardia (09/16 0800) Resp:  [13-26] 21 (09/16 0900) BP: (86-134)/(57-84) 114/67 (09/16 0800) SpO2:  [92 %-99 %] 94 % (09/16 0800) Weight:  [182 lb 1.6 oz (82.6 kg)] 182 lb 1.6 oz (82.6 kg) (09/16 0530)  Hemodynamic parameters for last 24 hours: CVP:  [33 mmHg-36 mmHg] 33 mmHg nl  Intake/Output from previous day: 09/15 0701 - 09/16 0700 In: 2696.3 [P.O.:1280; I.V.:916.3; IV Piggyback:500] Out: 1790 [Urine:1790] Intake/Output this shift: Total I/O In: 52.1 [I.V.:52.1] Out: 135 [Urine:135]  R great toe dry gangrene abd distended + small BM sinus tach Lab Results:  Recent Labs  03/16/16 1657 03/17/16 0400  WBC 22.6* 18.0*  HGB 9.6* 9.3*  HCT 29.3* 28.6*  PLT 180 202   BMET:  Recent Labs  03/16/16 2224 03/17/16 0400  NA 133* 135  K 3.9 3.6  CL 99* 99*  CO2 25 27  GLUCOSE 114* 52*  BUN 29* 29*  CREATININE 1.02 0.93  CALCIUM 8.3* 8.5*    PT/INR:  Recent Labs  03/15/16 0432  LABPROT 16.6*  INR 1.33   ABG    Component Value Date/Time   PHART 7.427 03/16/2016 0420   HCO3 25.3 03/16/2016 0420   TCO2 23 03/15/2016 1610   ACIDBASEDEF 2.0 03/14/2016 1715   O2SAT 70.6 03/17/2016 0428   CBG (last 3)   Recent Labs  03/17/16 0519 03/17/16 0618 03/17/16 0944  GLUCAP 68 96 148*    Assessment/Plan: S/P Procedure(s) (LRB): CORONARY ARTERY BYPASS GRAFTING (CABG) x 1 (SVG to OM) with EVH from [place] (N/A) VENTRICULAR  SEPTAL DEFECT (VSD) REPAIR (N/A) TRANSESOPHAGEAL ECHOCARDIOGRAM (TEE) (N/A) Mobilize Diuresis Diabetes control keep in ICU   LOS: 4 days    Tharon Aquas Trigt III 03/17/2016

## 2016-03-17 NOTE — Progress Notes (Signed)
       Patient Name: William [name] Date of Encounter: 03/17/2016    SUBJECTIVE: The patient is doing well. He is sitting at the bedside in the chair. He denies dyspnea. Increasing incisional pain. Attempt to wean dopamine but BP did not tolerate this AM.  TELEMETRY:  Sinus rhythm/sinus tachycardia with PACs and PVCs.   Vitals:   03/17/16 0900 03/17/16 0946 03/17/16 1000 03/17/16 1100  BP: (!) 86/42  (!) 91/51 104/68  Pulse:   97 (!) 114  Resp: (!) 21  (!) 21 19  Temp:  97.8 F (36.6 C)    TempSrc:  Oral    SpO2: 95%  100% 97%  Weight:      Height:        Intake/Output Summary (Last 24 hours) at 03/17/16 1223 Last data filed at 03/17/16 1100  Gross per 24 hour  Intake           2191.3 ml  Output             1460 ml  Net            731.3 ml   LABS: Basic Metabolic Panel:  Recent Labs  03/14/16 1541  03/16/16 2224 03/17/16 0400  NA  --   < > 133* 135  K  --   < > 3.9 3.6  CL  --   < > 99* 99*  CO2  --   < > 25 27  GLUCOSE  --   < > 114* 52*  BUN  --   < > 29* 29*  CREATININE 1.21  < > 1.02 0.93  CALCIUM  --   < > 8.3* 8.5*  MG 2.9*  --   --   --   < > = values in this interval not displayed. CBC:  Recent Labs  03/16/16 1657 03/17/16 0400  WBC 22.6* 18.0*  HGB 9.6* 9.3*  HCT 29.3* 28.6*  MCV 88.8 89.4  PLT 180 202    Radiology/Studies:  Chest x-ray reveals continued mild pulmonary edema but improving.  Physical Exam: Blood pressure 104/68, pulse (!) 114, temperature 97.8 F (36.6 C), temperature source Oral, resp. rate 19, height 5\' 5"  (1.651 m), weight 182 lb 1.6 oz (82.6 kg), SpO2 97 %. Weight change: -1 lb 5.2 oz (-0.6 kg)  Wt Readings from Last 3 Encounters:  03/17/16 182 lb 1.6 oz (82.6 kg)  03/13/16 162 lb (73.5 kg)  07/06/13 150 lb 3.2 oz (68.1 kg)  Sinus tachycardia No pericardial rub Chest is clear  ASSESSMENT:  1. Right artery disease status post single-vessel bypass 2. Successful surgical closure of postinfarct VSD 3. Postoperative  atrial fibrillation. Now in sinus rhythm on IV amiodarone 4. History of hypertension 5. History of hyperlipidemia  Plan:  1. Beta blocker therapy 2. High intensity statin therapy 3. Aspirin therapy and add Plavix prior to discharge 4. Was switched to PO amiodarone this AM without issues.  Signed, Nobel Brar Meredith Leeds 03/17/2016, 12:23 PM

## 2016-03-17 NOTE — Progress Notes (Signed)
CT surgery p.m. Rounds  Patient had stable day Ambulated in the hallway Sinus rhythm Dopamine weaned to 1 mcg/kg/m Diet advanced Continue foot care for dry gangrene of toe

## 2016-03-17 NOTE — Progress Notes (Addendum)
Hypoglycemic Event  CBG: 46  Treatment: 15 GM carbohydrate snack  Symptoms: None  Follow-up CBG: Time:0425 CBG Result:46    [id number]   68    0618   96  Possible Reasons for Event: Unknown  Comments/MD notified: 2nd, 3rd, and 4th 15 GM carbohydrate snack given    Eustace Quail

## 2016-03-18 ENCOUNTER — Inpatient Hospital Stay (HOSPITAL_COMMUNITY): Payer: Medicare Other

## 2016-03-18 LAB — POCT I-STAT, CHEM 8
BUN: 28 mg/dL — ABNORMAL HIGH (ref 6–20)
Calcium, Ion: 1.16 mmol/L (ref 1.15–1.40)
Chloride: 95 mmol/L — ABNORMAL LOW (ref 101–111)
Creatinine, Ser: 1 mg/dL (ref 0.61–1.24)
Glucose, Bld: 125 mg/dL — ABNORMAL HIGH (ref 65–99)
HCT: 28 % — ABNORMAL LOW (ref 39.0–52.0)
Hemoglobin: 9.5 g/dL — ABNORMAL LOW (ref 13.0–17.0)
Potassium: 4.6 mmol/L (ref 3.5–5.1)
Sodium: 131 mmol/L — ABNORMAL LOW (ref 135–145)
TCO2: 27 mmol/L (ref 0–100)

## 2016-03-18 LAB — BASIC METABOLIC PANEL
Anion gap: 6 (ref 5–15)
BUN: 27 mg/dL — ABNORMAL HIGH (ref 6–20)
CO2: 27 mmol/L (ref 22–32)
Calcium: 8.2 mg/dL — ABNORMAL LOW (ref 8.9–10.3)
Chloride: 97 mmol/L — ABNORMAL LOW (ref 101–111)
Creatinine, Ser: 0.91 mg/dL (ref 0.61–1.24)
GFR calc Af Amer: >60 mL/min (ref >60)
GFR calc non Af Amer: >60 mL/min (ref >60)
Glucose, Bld: 192 mg/dL — ABNORMAL HIGH (ref 65–99)
Potassium: 4.6 mmol/L (ref 3.5–5.1)
Sodium: 130 mmol/L — ABNORMAL LOW (ref 135–145)

## 2016-03-18 LAB — CARBOXYHEMOGLOBIN
Carboxyhemoglobin: 1.2 % (ref 0.5–1.5)
Methemoglobin: 1.1 % (ref 0.0–1.5)
O2 SAT: 67.2 %
TOTAL HEMOGLOBIN: 9.2 g/dL — AB (ref 12.0–16.0)

## 2016-03-18 LAB — CBC
HCT: 27.3 % — ABNORMAL LOW (ref 39.0–52.0)
Hemoglobin: 8.9 g/dL — ABNORMAL LOW (ref 13.0–17.0)
MCH: 29.2 pg (ref 26.0–34.0)
MCHC: 32.6 g/dL (ref 30.0–36.0)
MCV: 89.5 fL (ref 78.0–100.0)
Platelets: 221 10*3/uL (ref 150–400)
RBC: 3.05 MIL/uL — ABNORMAL LOW (ref 4.22–5.81)
RDW: 13.4 % (ref 11.5–15.5)
WBC: 13.8 10*3/uL — ABNORMAL HIGH (ref 4.0–10.5)

## 2016-03-18 LAB — GLUCOSE, CAPILLARY
GLUCOSE-CAPILLARY: 77 mg/dL (ref 65–99)
Glucose-Capillary: 182 mg/dL — ABNORMAL HIGH (ref 65–99)
Glucose-Capillary: 187 mg/dL — ABNORMAL HIGH (ref 65–99)
Glucose-Capillary: 140 mg/dL — ABNORMAL HIGH (ref 65–99)

## 2016-03-18 MED ORDER — SPIRONOLACTONE 25 MG PO TABS
25.0000 mg | ORAL_TABLET | Freq: Every day | ORAL | Status: DC
  Administered 2016-03-18 – 2016-04-03 (×15): 25 mg via ORAL
  Filled 2016-03-18 (×15): qty 1

## 2016-03-18 MED ORDER — SODIUM CHLORIDE 0.9% FLUSH
10.0000 mL | Freq: Two times a day (BID) | INTRAVENOUS | Status: DC
  Administered 2016-03-19 – 2016-03-20 (×4): 10 mL
  Administered 2016-03-21: 20 mL
  Administered 2016-03-21 – 2016-03-28 (×11): 10 mL
  Administered 2016-03-28: 20 mL
  Administered 2016-03-29 – 2016-03-30 (×3): 10 mL

## 2016-03-18 MED ORDER — METOLAZONE 5 MG PO TABS
5.0000 mg | ORAL_TABLET | Freq: Every day | ORAL | Status: AC
  Administered 2016-03-18 – 2016-03-19 (×2): 5 mg via ORAL
  Filled 2016-03-18 (×2): qty 1

## 2016-03-18 MED ORDER — CARVEDILOL 3.125 MG PO TABS
3.1250 mg | ORAL_TABLET | Freq: Every day | ORAL | Status: DC
  Administered 2016-03-19: 3.125 mg via ORAL
  Filled 2016-03-18: qty 1

## 2016-03-18 MED ORDER — INSULIN DETEMIR 100 UNIT/ML ~~LOC~~ SOLN
12.0000 [IU] | Freq: Two times a day (BID) | SUBCUTANEOUS | Status: DC
  Administered 2016-03-18 – 2016-03-31 (×27): 12 [IU] via SUBCUTANEOUS
  Filled 2016-03-18 (×30): qty 0.12

## 2016-03-18 NOTE — Plan of Care (Signed)
Problem: Fluid Volume: Goal: Ability to maintain a balanced intake and output will improve Outcome: Progressing Receiving Lasix, still very swollen  Problem: Activity: Goal: Risk for activity intolerance will decrease Outcome: Progressing Pt ambulated twice today with much encouragment, tolerated well, moving in and out of bed improved, no dyspnea  Problem: Bowel/Gastric: Goal: Gastrointestinal status for postoperative course will improve Outcome: Progressing Pt has active bowel sounds and passing gas but no BM today

## 2016-03-18 NOTE — Progress Notes (Signed)
5 Days Post-Op Procedure(s) (LRB): CORONARY ARTERY BYPASS GRAFTING (CABG) x 1 (SVG to OM) with EVH from [place] (N/A) VENTRICULAR SEPTAL DEFECT (VSD) REPAIR (N/A) TRANSESOPHAGEAL ECHOCARDIOGRAM (TEE) (N/A) Subjective: Continues with slow progress after surgery for completed DMI and post MI VSD Sinus tachycardia Mixed venous saturation improved greater than 60% today We'll leave on low-dose dopamine and milrinone Patient has perineal anasarca from probable right heart failure Will check postop echo and increased diuretic dosing, add Aldactone Dry gangrene of right great toe treated with local measures  Objective: Vital signs in last 24 hours: Temp:  [97.6 F (36.4 C)-98.5 F (36.9 C)] 97.7 F (36.5 C) (09/17 0741) Pulse Rate:  [107-119] 107 (09/17 0800) Cardiac Rhythm: Sinus tachycardia (09/17 0800) Resp:  [12-26] 21 (09/17 0800) BP: (80-131)/(47-101) 101/66 (09/17 0800) SpO2:  [92 %-99 %] 95 % (09/17 0824) Weight:  [180 lb 12.4 oz (82 kg)] 180 lb 12.4 oz (82 kg) (09/17 0630)  Hemodynamic parameters for last 24 hours:  stable  Intake/Output from previous day: 09/16 0701 - 09/17 0700 In: 1245.2 [P.O.:480; I.V.:615.2; IV Piggyback:150] Out: 2855 [Urine:2855] Intake/Output this shift: Total I/O In: 24.3 [I.V.:24.3] Out: 300 [Urine:300]  Lungs clearing No cardiac murmur Significant perineal edema Extremities warm Neuro intact  Lab Results:  Recent Labs  03/17/16 0400 03/17/16 1751 03/18/16 0438  WBC 18.0*  --  13.8*  HGB 9.3* 9.5* 8.9*  HCT 28.6* 28.0* 27.3*  PLT 202  --  221   BMET:  Recent Labs  03/17/16 0400 03/17/16 1751 03/18/16 0438  NA 135 132* 130*  K 3.6 4.3 4.6  CL 99* 95* 97*  CO2 27  --  27  GLUCOSE 52* 162* 192*  BUN 29* 26* 27*  CREATININE 0.93 0.90 0.91  CALCIUM 8.5*  --  8.2*    PT/INR: No results for input(s): LABPROT, INR in the last 72 hours. ABG    Component Value Date/Time   PHART 7.427 03/16/2016 0420   HCO3 25.3 03/16/2016 0420   TCO2 25 03/17/2016 1751   ACIDBASEDEF 2.0 03/14/2016 1715   O2SAT 67.2 03/18/2016 0318   CBG (last 3)   Recent Labs  03/17/16 1728 03/17/16 2149 03/18/16 0740  GLUCAP 161* 277* 182*    Assessment/Plan: S/P Procedure(s) (LRB): CORONARY ARTERY BYPASS GRAFTING (CABG) x 1 (SVG to OM) with EVH from [place] (N/A) VENTRICULAR SEPTAL DEFECT (VSD) REPAIR (N/A) TRANSESOPHAGEAL ECHOCARDIOGRAM (TEE) (N/A) Continue inotropes for probable RV dysfunction from completed inferior MI Increased diuretic dosing Check 2-D echocardiogram to assess RV   LOS: 5 days    Tharon Aquas Trigt III 03/18/2016

## 2016-03-18 NOTE — Progress Notes (Signed)
       Patient Name: William [name] Date of Encounter: 03/18/2016    SUBJECTIVE: The patient is doing well. He is sitting at the bedside in the chair. He denies dyspnea. Increasing incisional pain. Remains on dopamine and milrinone  TELEMETRY:  Sinus rhythm/sinus tachycardia with PACs and PVCs.   Vitals:   03/18/16 0800 03/18/16 0824 03/18/16 0900 03/18/16 1000  BP: 101/66  97/62 110/73  Pulse: (!) 107  (!) 110 (!) 116  Resp: (!) 21  (!) 25 (!) 26  Temp:      TempSrc:      SpO2: 98% 95% 96% 94%  Weight:      Height:        Intake/Output Summary (Last 24 hours) at 03/18/16 1130 Last data filed at 03/18/16 1100  Gross per 24 hour  Intake            755.8 ml  Output             2860 ml  Net          -2104.2 ml   LABS: Basic Metabolic Panel:  Recent Labs  03/17/16 0400 03/17/16 1751 03/18/16 0438  NA 135 132* 130*  K 3.6 4.3 4.6  CL 99* 95* 97*  CO2 27  --  27  GLUCOSE 52* 162* 192*  BUN 29* 26* 27*  CREATININE 0.93 0.90 0.91  CALCIUM 8.5*  --  8.2*   CBC:  Recent Labs  03/17/16 0400 03/17/16 1751 03/18/16 0438  WBC 18.0*  --  13.8*  HGB 9.3* 9.5* 8.9*  HCT 28.6* 28.0* 27.3*  MCV 89.4  --  89.5  PLT 202  --  221    Radiology/Studies:  Chest x-ray reveals continued mild pulmonary edema but improving.  Physical Exam: Blood pressure 110/73, pulse (!) 116, temperature 97.7 F (36.5 C), temperature source Oral, resp. rate (!) 26, height 5\' 5"  (1.651 m), weight 180 lb 12.4 oz (82 kg), SpO2 94 %. Weight change: -1 lb 5.2 oz (-0.6 kg)  Wt Readings from Last 3 Encounters:  03/18/16 180 lb 12.4 oz (82 kg)  03/13/16 162 lb (73.5 kg)  07/06/13 150 lb 3.2 oz (68.1 kg)  Sinus tachycardia No pericardial rub Chest is clear  ASSESSMENT:  1. Right artery disease status post single-vessel bypass 2. Successful surgical closure of postinfarct VSD 3. Postoperative atrial fibrillation. Now in sinus rhythm on IV amiodarone 4. History of hypertension 5. History of  hyperlipidemia  Plan:  1. Beta blocker therapy 2. High intensity statin therapy 3. Aspirin therapy and add Plavix prior to discharge 4. On PO amiodarone.  Carson Meche continue as he remains in sinus rhythm.  Signed, Taliana Mersereau Meredith Leeds 03/18/2016, 11:30 AM

## 2016-03-19 ENCOUNTER — Inpatient Hospital Stay (HOSPITAL_COMMUNITY): Payer: Medicare Other

## 2016-03-19 DIAGNOSIS — R079 Chest pain, unspecified: Secondary | ICD-10-CM

## 2016-03-19 LAB — CBC
HCT: 31 % — ABNORMAL LOW (ref 39.0–52.0)
Hemoglobin: 10.1 g/dL — ABNORMAL LOW (ref 13.0–17.0)
MCH: 29.1 pg (ref 26.0–34.0)
MCHC: 32.6 g/dL (ref 30.0–36.0)
MCV: 89.3 fL (ref 78.0–100.0)
Platelets: 266 10*3/uL (ref 150–400)
RBC: 3.47 MIL/uL — ABNORMAL LOW (ref 4.22–5.81)
RDW: 13.3 % (ref 11.5–15.5)
WBC: 15.6 10*3/uL — ABNORMAL HIGH (ref 4.0–10.5)

## 2016-03-19 LAB — COMPREHENSIVE METABOLIC PANEL
ALT: 23 U/L (ref 17–63)
AST: 29 U/L (ref 15–41)
Albumin: 2.5 g/dL — ABNORMAL LOW (ref 3.5–5.0)
Alkaline Phosphatase: 233 U/L — ABNORMAL HIGH (ref 38–126)
Anion gap: 11 (ref 5–15)
BUN: 26 mg/dL — ABNORMAL HIGH (ref 6–20)
CO2: 23 mmol/L (ref 22–32)
Calcium: 8.6 mg/dL — ABNORMAL LOW (ref 8.9–10.3)
Chloride: 98 mmol/L — ABNORMAL LOW (ref 101–111)
Creatinine, Ser: 0.92 mg/dL (ref 0.61–1.24)
GFR calc Af Amer: >60 mL/min (ref >60)
GFR calc non Af Amer: >60 mL/min (ref >60)
Glucose, Bld: 89 mg/dL (ref 65–99)
Potassium: 4.3 mmol/L (ref 3.5–5.1)
Sodium: 132 mmol/L — ABNORMAL LOW (ref 135–145)
Total Bilirubin: 0.8 mg/dL (ref 0.3–1.2)
Total Protein: 5.5 g/dL — ABNORMAL LOW (ref 6.5–8.1)

## 2016-03-19 LAB — CARBOXYHEMOGLOBIN
Carboxyhemoglobin: 1.8 % — ABNORMAL HIGH (ref 0.5–1.5)
Methemoglobin: 0.9 % (ref 0.0–1.5)
O2 Saturation: 57.2 %
Total hemoglobin: 9.2 g/dL — ABNORMAL LOW (ref 12.0–16.0)

## 2016-03-19 LAB — GLUCOSE, CAPILLARY
GLUCOSE-CAPILLARY: 67 mg/dL (ref 65–99)
GLUCOSE-CAPILLARY: 119 mg/dL — AB (ref 65–99)
GLUCOSE-CAPILLARY: 120 mg/dL — AB (ref 65–99)
GLUCOSE-CAPILLARY: 152 mg/dL — AB (ref 65–99)

## 2016-03-19 LAB — ECHOCARDIOGRAM COMPLETE
Height: 65 in
Weight: 2850.11 oz

## 2016-03-19 MED ORDER — ALTEPLASE 2 MG IJ SOLR
2.0000 mg | Freq: Once | INTRAMUSCULAR | Status: AC
  Administered 2016-03-19: 2 mg

## 2016-03-19 MED ORDER — ALTEPLASE 2 MG IJ SOLR
2.0000 mg | Freq: Once | INTRAMUSCULAR | Status: AC
  Administered 2016-03-19: 2 mg
  Filled 2016-03-19 (×2): qty 2

## 2016-03-19 MED FILL — Thrombin For Soln 5000 Unit: CUTANEOUS | Qty: 5000 | Status: AC

## 2016-03-19 NOTE — Evaluation (Signed)
Physical Therapy Evaluation Patient Details Name: William [name] MRN: [id number] DOB: 19-Apr-1951 Today's Date: 03/19/2016   History of Present Illness  65 yo s/p acute MI with CABGx1 and septal defect repair. PMHx: DM, smoker, HTN, HLD, right great toe gangrene  Clinical Impression  Pt very pleasant and up with nursing prior to session this morning. Pt reports he is normally very independent, does yard work and is surrounded by family. Pt educated for sternal precautions with handout provided as well as implication and use during functional activities and gait. Pt with decreased transfers, gait and mobility who will benefit from acute therapy to maximize mobility and independence prior to D/C adhering to precautions.   HR 114-116 sats 98% on RA BP 101/59 before gait 109/69 after gait    Follow Up Recommendations Home health PT    Equipment Recommendations  None recommended by PT    Recommendations for Other Services       Precautions / Restrictions Precautions Precautions: Sternal Precaution Comments: sternal precaution handout provided Restrictions Weight Bearing Restrictions: Yes Other Position/Activity Restrictions: sternal precautions      Mobility  Bed Mobility               General bed mobility comments: in chair on arrival  Transfers Overall transfer level: Needs assistance   Transfers: Sit to/from Stand Sit to Stand: Min guard         General transfer comment: cues for hand placement and safety  Ambulation/Gait Ambulation/Gait assistance: Min guard Ambulation Distance (Feet): 200 Feet Assistive device: Rolling walker (2 wheeled) Gait Pattern/deviations: Step-through pattern;Decreased stride length   Gait velocity interpretation: Below normal speed for age/gender General Gait Details: pt with slow steady gait with initial cues for use of RW and posture. pt fatigued at 200' and required ride in chair back to room, no CP or SOB  Stairs             Wheelchair Mobility    Modified Rankin (Stroke Patients Only)       Balance                                             Pertinent Vitals/Pain Pain Assessment: No/denies pain    Home Living Family/patient expects to be discharged to:: Private residence Living Arrangements: Spouse/significant other;Children Available Help at Discharge: Family;Available 24 hours/day Type of Home: House Home Access: Stairs to enter Entrance Stairs-Rails: None Entrance Stairs-Number of Steps: 2 Home Layout: One level Home Equipment: Walker - 2 wheels;Cane - single point;Shower seat;Wheelchair - manual;Grab bars - tub/shower      Prior Function Level of Independence: Independent               Hand Dominance   Dominant Hand: Right    Extremity/Trunk Assessment   Upper Extremity Assessment: Overall WFL for tasks assessed           Lower Extremity Assessment: Overall WFL for tasks assessed      Cervical / Trunk Assessment: Other exceptions  Communication   Communication: No difficulties  Cognition Arousal/Alertness: Awake/alert Behavior During Therapy: WFL for tasks assessed/performed Overall Cognitive Status: Within Functional Limits for tasks assessed                      General Comments      Exercises     Assessment/Plan    PT Assessment  Patient needs continued PT services  PT Problem List Decreased activity tolerance;Decreased mobility;Decreased knowledge of precautions;Decreased knowledge of use of DME          PT Treatment Interventions Gait training;DME instruction;Therapeutic activities;Stair training;Therapeutic exercise;Patient/family education;Functional mobility training    PT Goals (Current goals can be found in the Care Plan section)  Acute Rehab PT Goals Patient Stated Goal: return to mowing the yard PT Goal Formulation: With patient Time For Goal Achievement: 04/02/16 Potential to Achieve Goals: Good     Frequency Min 3X/week   Barriers to discharge        Co-evaluation PT/OT/SLP Co-Evaluation/Treatment: Yes Reason for Co-Treatment: For patient/therapist safety PT goals addressed during session: Mobility/safety with mobility;Proper use of DME OT goals addressed during session: ADL's and self-care;Strengthening/ROM       End of Session Equipment Utilized During Treatment: Gait belt Activity Tolerance: Patient tolerated treatment well Patient left: in chair;with call bell/phone within reach;Other (comment) (with OT) Nurse Communication: Mobility status         Time: [phone] PT Time Calculation (min) (ACUTE ONLY): 19 min   Charges:   PT Evaluation $PT Eval Moderate Complexity: 1 Procedure     PT G CodesMelford Aase 03/19/2016, 10:04 AM Elwyn Reach, [place]

## 2016-03-19 NOTE — Evaluation (Signed)
Occupational Therapy Evaluation Patient Details Name: William [name] MRN: [id number] DOB: 11-27-1950 Today's Date: 03/19/2016    History of Present Illness 65 yo s/p acute MI with CABGx1 and septal defect repair. PMHx: DM, smoker, HTN, HLD, right great toe gangrene   Clinical Impression   This 65 yo male admitted and underwent above presents to acute OT with deficits below (see OT problem list) thus affecting his PLOF of Independent with basic and IADLs. He will benefit from acute OT without need for follow up.    Follow Up Recommendations  No OT follow up    Equipment Recommendations  3 in 1 bedside comode;Other (comment) (may or may not need 3n1 need to further assess)       Precautions / Restrictions Precautions Precautions: Sternal Precaution Comments: sternal precaution handout provided Restrictions Weight Bearing Restrictions: Yes Other Position/Activity Restrictions: sternal precautions      Mobility Bed Mobility               General bed mobility comments: in chair on arrival  Transfers Overall transfer level: Needs assistance   Transfers: Sit to/from Stand Sit to Stand: Min guard         General transfer comment: cues for hand placement and safety    Balance Overall balance assessment: Needs assistance Sitting-balance support: No upper extremity supported;Feet supported Sitting balance-Leahy Scale: Fair     Standing balance support: Bilateral upper extremity supported Standing balance-Leahy Scale: Poor (reliant on RW)                              ADL Overall ADL's : Needs assistance/impaired Eating/Feeding: Independent;Sitting   Grooming: Set up;Sitting   Upper Body Bathing: Set up;Sitting   Lower Body Bathing: Moderate assistance (min A sit<>stand)   Upper Body Dressing : Set up;Sitting   Lower Body Dressing: Maximal assistance (min A sit<>stand)   Toilet Transfer: Minimal assistance;Ambulation;RW Toilet Transfer  Details (indicate cue type and reason): recliner>down hallway> sit in chair behind him Toileting- Clothing Manipulation and Hygiene: Moderate assistance (min A sit<>stand)         General ADL Comments: Pt usually bends forward to doff/don lower body clothing--made him aware not to do this for now due to how much pressure this would put on his chest. He is unable to cross his legs to get to his feet. He reports that his family can do these tasks for him until he can do them by himself again. He asked me about shirts and I explained to him how to don a button up shirt and pull over shirt.               Pertinent Vitals/Pain Pain Assessment: No/denies pain     Hand Dominance Right   Extremity/Trunk Assessment Upper Extremity Assessment Upper Extremity Assessment: Overall WFL for tasks assessed   Lower Extremity Assessment Lower Extremity Assessment: Overall WFL for tasks assessed   Cervical / Trunk Assessment Cervical / Trunk Assessment: Other exceptions Cervical / Trunk Exceptions: limited neck movement secondary to 3 breaks and surgeries    Communication Communication Communication: No difficulties   Cognition Arousal/Alertness: Awake/alert Behavior During Therapy: WFL for tasks assessed/performed Overall Cognitive Status: Within Functional Limits for tasks assessed                                Home Living Family/patient expects to be discharged  to:: Private residence Living Arrangements: Spouse/significant other;Children Available Help at Discharge: Family;Available 24 hours/day Type of Home: House Home Access: Stairs to enter [employer] of Steps: 2 Entrance Stairs-Rails: None Home Layout: One level     Bathroom Shower/Tub: Tub/shower unit;Curtain Shower/tub characteristics: Architectural technologist: Standard     Home Equipment: Environmental consultant - 2 wheels;Cane - single point;Shower seat;Wheelchair - manual;Grab bars - tub/shower           Prior Functioning/Environment Level of Independence: Independent                 OT Problem List: Impaired balance (sitting and/or standing);Decreased strength;Decreased knowledge of use of DME or AE   OT Treatment/Interventions: Self-care/ADL training;Balance training;DME and/or AE instruction;Patient/family education    OT Goals(Current goals can be found in the care plan section) Acute Rehab OT Goals Patient Stated Goal: return to mowing the yard OT Goal Formulation: With patient Time For Goal Achievement: 03/26/16 Potential to Achieve Goals: Good  OT Frequency: Min 2X/week           Co-evaluation PT/OT/SLP Co-Evaluation/Treatment: Yes Reason for Co-Treatment: For patient/therapist safety PT goals addressed during session: Mobility/safety with mobility;Proper use of DME OT goals addressed during session: ADL's and self-care;Strengthening/ROM      End of Session Equipment Utilized During Treatment: Gait belt;Rolling walker Nurse Communication:  (RN OK'd to leave O2 off for now)  Activity Tolerance: Patient tolerated treatment well Patient left: in chair;with call bell/phone within reach   Time: 0754-0819 OT Time Calculation (min): 25 min Charges:  OT General Charges $OT Visit: 1 Procedure OT Evaluation $OT Eval Moderate Complexity: 1 Procedure  William [name] [id number] 03/19/2016, 10:54 AM

## 2016-03-19 NOTE — Progress Notes (Signed)
      [address]       [place] [address]             [phone]      POD # 6 CABG, repair of VSD  Resting comfortably, without complaints  BP 116/73 (BP Location: Right Arm)   Pulse 96   Temp 97.7 F (36.5 C) (Oral)   Resp (!) 21   Ht 5\' 5"  (1.651 m)   Wt 178 lb 2.1 oz (80.8 kg)   SpO2 97%   BMI 29.64 kg/m    Intake/Output Summary (Last 24 hours) at 03/19/16 1834 Last data filed at 03/19/16 1800  Gross per 24 hour  Intake           848.58 ml  Output             2875 ml  Net         -2026.42 ml   CBG well controlled  Remo Lipps C. Roxan Hockey, MD [facility] [phone]

## 2016-03-19 NOTE — Progress Notes (Signed)
  Echocardiogram 2D Echocardiogram has been performed.  Tresa Res 03/19/2016, 1:44 PM

## 2016-03-19 NOTE — Progress Notes (Signed)
6 Days Post-Op Procedure(s) (LRB): CORONARY ARTERY BYPASS GRAFTING (CABG) x 1 (SVG to OM) with EVH from [place] (N/A) VENTRICULAR SEPTAL DEFECT (VSD) REPAIR (N/A) TRANSESOPHAGEAL ECHOCARDIOGRAM (TEE) (N/A) Subjective: R gangrenous toe worse with no osteo on xray Off dopamine C0-Ox 57% Walking in hall nsr Echo shows no residual VSD  Objective: Vital signs in last 24 hours: Temp:  [97.9 F (36.6 C)-98.7 F (37.1 C)] 98.7 F (37.1 C) (09/18 1200) Pulse Rate:  [94-118] 94 (09/18 1600) Cardiac Rhythm: Normal sinus rhythm (09/18 1600) Resp:  [10-26] 18 (09/18 1600) BP: (84-117)/(40-79) 98/64 (09/18 1600) SpO2:  [94 %-100 %] 94 % (09/18 1600) Weight:  [178 lb 2.1 oz (80.8 kg)] 178 lb 2.1 oz (80.8 kg) (09/18 0500)  Hemodynamic parameters for last 24 hours:  stable  Intake/Output from previous day: 09/17 0701 - 09/18 0700 In: 553.2 [I.V.:453.2; IV Piggyback:100] Out: 3375 [Urine:3375] Intake/Output this shift: Total I/O In: 434.3 [P.O.:360; I.V.:24.3; IV Piggyback:50] Out: 1100 [Urine:1100]  No murmur Lungs clear Cellulitis R foot - add vancomycin Ortho consult pending  Lab Results:  Recent Labs  03/18/16 0438 03/18/16 1716 03/19/16 0454  WBC 13.8*  --  15.6*  HGB 8.9* 9.5* 10.1*  HCT 27.3* 28.0* 31.0*  PLT 221  --  266   BMET:  Recent Labs  03/18/16 0438 03/18/16 1716 03/19/16 0454  NA 130* 131* 132*  K 4.6 4.6 4.3  CL 97* 95* 98*  CO2 27  --  23  GLUCOSE 192* 125* 89  BUN 27* 28* 26*  CREATININE 0.91 1.00 0.92  CALCIUM 8.2*  --  8.6*    PT/INR: No results for input(s): LABPROT, INR in the last 72 hours. ABG    Component Value Date/Time   PHART 7.427 03/16/2016 0420   HCO3 25.3 03/16/2016 0420   TCO2 27 03/18/2016 1716   ACIDBASEDEF 2.0 03/14/2016 1715   O2SAT 57.2 03/19/2016 1208   CBG (last 3)   Recent Labs  03/18/16 2147 03/19/16 0903 03/19/16 1241  GLUCAP 77 67 119*    Assessment/Plan: S/P Procedure(s)  (LRB): CORONARY ARTERY BYPASS GRAFTING (CABG) x 1 (SVG to OM) with EVH from [place] (N/A) VENTRICULAR SEPTAL DEFECT (VSD) REPAIR (N/A) TRANSESOPHAGEAL ECHOCARDIOGRAM (TEE) (N/A) Cont milrinone - borderline Co-ox Transfer to stepdown soon   LOS: 6 days    William [name] 03/19/2016

## 2016-03-19 NOTE — Progress Notes (Signed)
Subjective:   Day 5 s/p CABG with SVG to OM and VSD repair.  Objective:   Vital Signs : Vitals:   03/19/16 1134 03/19/16 1200 03/19/16 1300 03/19/16 1400  BP: 90/63 95/60 (!) 107/40 (!) 84/48  Pulse:  95 96 96  Resp: (!) 23 (!) _0 Temp:  98.7 F (37.1 C)    TempSrc:  Oral    SpO2:  96% 96% 95%  Weight:      Height:        Intake/Output from previous day:  Intake/Output Summary (Last 24 hours) at 03/19/16 1458 Last data filed at 03/19/16 1400  Gross per 24 hour  Intake           720.38 ml  Output             3175 ml  Net         -2454.62 ml    I/O since admission: -861  Wt Readings from Last 3 Encounters:  03/19/16 178 lb 2.1 oz (80.8 kg)  03/13/16 162 lb (73.5 kg)  07/06/13 150 lb 3.2 oz (68.1 kg)    Medications: . amiodarone  200 mg Oral BID  . aspirin EC  325 mg Oral Daily   Or  . aspirin  324 mg Per Tube Daily  . bisacodyl  10 mg Oral Daily  . carvedilol  3.125 mg Oral Daily  . ceFEPime (MAXIPIME) IV  1 g Intravenous Q12H  . digoxin  0.125 mg Oral Daily  . docusate sodium  200 mg Oral Daily  . enoxaparin (LOVENOX) injection  40 mg Subcutaneous Q24H  . furosemide  40 mg Intravenous BID  . Influenza vac split quadrivalent PF  0.5 mL Intramuscular Once  . insulin aspart  0-15 Units Subcutaneous TID WC  . insulin aspart  0-5 Units Subcutaneous QHS  . insulin detemir  12 Units Subcutaneous BID  . mouth rinse  15 mL Mouth Rinse BID  . metoCLOPramide (REGLAN) injection  10 mg Intravenous Q6H  . mometasone-formoterol  2 puff Inhalation BID  . nicotine  14 mg Transdermal QHS  . pantoprazole  40 mg Oral Daily  . rosuvastatin  40 mg Oral q1800  . sodium chloride flush  10-40 mL Intracatheter Q12H  . spironolactone  25 mg Oral Daily    . sodium chloride Stopped (03/14/16 1000)  . milrinone 0.127 mcg/kg/min (03/19/16 1400)    Physical Exam:   General appearance: alert, cooperative and no distress Neck: no adenopathy, no carotid bruit, no JVD,  supple, symmetrical, trachea midline and thyroid not enlarged, symmetric, no tenderness/mass/nodules Lungs: faint expiratory wheezing Heart: RRR no s3; no rub Abdomen: soft, non-tender; bowel sounds normal; no masses,  no organomegaly Extremities: trace edema bilaterally Skin: Skin color, texture, turgor normal. No rashes or lesions Neurologic: Grossly normal   Rate: 90  Rhythm: normal sinus rhythm  ECG (independently read by me):  Lab Results:   Recent Labs  03/17/16 0400  03/18/16 0438 03/18/16 1716 03/19/16 0454  NA 135  < > 130* 131* 132*  K 3.6  < > 4.6 4.6 4.3  CL 99*  < > 97* 95* 98*  CO2 27  --  27  --  23  GLUCOSE 52*  < > 192* 125* 89  BUN 29*  < > 27* 28* 26*  CREATININE 0.93  < > 0.91 1.00 0.92  CALCIUM 8.5*  --  8.2*  --  8.6*  < > = values in this interval  not displayed.  Hepatic Function Latest Ref Rng & Units 03/19/2016 03/17/2016 03/16/2016  Total Protein 6.5 - 8.1 g/dL 5.5(L) 5.2(L) 5.1(L)  Albumin 3.5 - 5.0 g/dL 2.5(L) 2.4(L) 2.6(L)  AST 15 - 41 U/L _0 ALT 17 - 63 U/L _1 Alk Phosphatase 38 - 126 U/L 233(H) 134(H) 97  Total Bilirubin 0.3 - 1.2 mg/dL 0.8 0.6 0.9  Bilirubin, Direct 0.0 - 0.3 mg/dL - - -     Recent Labs  03/17/16 0400  03/18/16 0438 03/18/16 1716 03/19/16 0454  WBC 18.0*  --  13.8*  --  15.6*  HGB 9.3*  < > 8.9* 9.5* 10.1*  HCT 28.6*  < > 27.3* 28.0* 31.0*  MCV 89.4  --  89.5  --  89.3  PLT 202  --  221  --  266  < > = values in this interval not displayed.  No results for input(s): TROPONINI in the last 72 hours.  Invalid input(s): CK, MB  Lab Results  Component Value Date   TSH 1.885 07/10/2011   No results for input(s): HGBA1C in the last 72 hours.   Recent Labs  03/17/16 0400 03/19/16 0454  PROT 5.2* 5.5*  ALBUMIN 2.4* 2.5*  AST 25 29  ALT 18 23  ALKPHOS 134* 233*  BILITOT 0.6 0.8   No results for input(s): INR in the last 72 hours. BNP (last 3 results) No results for input(s): BNP in the  last 8760 hours.  ProBNP (last 3 results) No results for input(s): PROBNP in the last 8760 hours.   Lipid Panel     Component Value Date/Time   CHOL 96 03/13/2016 1057   TRIG 146 03/13/2016 1057   HDL 23 (L) 03/13/2016 1057   CHOLHDL 4.2 03/13/2016 1057   VLDL 29 03/13/2016 1057   LDLCALC 44 03/13/2016 1057      Imaging:  Dg Chest Port 1 View  Result Date: 03/19/2016 CLINICAL DATA:  65 year old male post CABG and VSD repair. Subsequent encounter. EXAM: PORTABLE CHEST 1 VIEW COMPARISON:  03/18/2016 chest x-ray. FINDINGS: Post median sternotomy/ CABG.  Heart size top-normal. Left central line tip mid to distal superior vena cava. Pulmonary vascular congestion minimally changed. Right base subsegmental atelectasis/small pleural effusions stable. No pneumothorax. Calcified mildly tortuous aorta. IMPRESSION: Pulmonary vascular congestion minimally changed. Right base subsegmental atelectasis/small pleural effusions stable. Aortic atherosclerosis. Electronically Signed   By: Genia Del M.D.   On: 03/19/2016 07:51   Dg Chest Port 1 View  Result Date: 03/18/2016 CLINICAL DATA:  Hx CABG and VSD repair on 9/12 EXAM: PORTABLE CHEST 1 VIEW COMPARISON:  03/17/2016 FINDINGS: LEFT PICC line unchanged. Stable cardiac silhouette. Slight increase in RIGHT basilar atelectasis. Mild central venous congestion increased. No consolidation. No pneumothorax IMPRESSION: Mild increase in central venous congestion and RIGHT basilar atelectasis. Electronically Signed   By: Suzy Bouchard M.D.   On: 03/18/2016 07:20   Dg Foot Complete Right  Result Date: 03/19/2016 CLINICAL DATA:  Diabetic ulcer bottom of right great toe EXAM: RIGHT FOOT COMPLETE - 3+ VIEW COMPARISON:  02/29/2016 FINDINGS: No radiographic changes of osteomyelitis. Soft tissue swelling within the right great toe. No fracture, subluxation or dislocation. IMPRESSION: No acute bony abnormality. No radiographic changes of osteomyelitis.  Electronically Signed   By: Rolm Baptise M.D.   On: 03/19/2016 10:31      Assessment/Plan:   Principal Problem:   Acute MI, inferoposterior wall, initial episode of care [facility]) Active Problems:  DM (diabetes mellitus), type 2, uncontrolled ([place])   Hyperlipidemia   MI (myocardial infarction) ([place])   Paroxysmal atrial fibrillation ([name])   CAD (coronary artery disease), native coronary artery   VSD (ventricular septal defect)   Hx of CABG  1. CAD; S/P IMI with total RCA with necrotic muscle in RCA territory, not bypassed and SVG to OM 2. Post MI VSD; s/p repair 3. Post PAF; now maintaining NSR on oral amiodarone 200 mg bid 4. Hypotension on milrinone 5. Anemia: H/H today 10.1/31 6. Hyperlipidemia: LDL 44 on crestor 40 mg     Troy Sine, MD, [facility] 03/19/2016, 2:58 PM

## 2016-03-19 NOTE — Care Management Important Message (Signed)
Important Message  Patient Details  Name: William [name] MRN: [id number] Date of Birth: 1950-11-01   Medicare Important Message Given:  Yes    Nathen May 03/19/2016, 9:43 AM

## 2016-03-20 ENCOUNTER — Other Ambulatory Visit (HOSPITAL_COMMUNITY): Payer: Medicare Other

## 2016-03-20 ENCOUNTER — Inpatient Hospital Stay (HOSPITAL_COMMUNITY): Payer: Medicare Other

## 2016-03-20 LAB — GLUCOSE, CAPILLARY
Glucose-Capillary: 85 mg/dL (ref 65–99)
Glucose-Capillary: 209 mg/dL — ABNORMAL HIGH (ref 65–99)
Glucose-Capillary: 92 mg/dL (ref 65–99)
Glucose-Capillary: 132 mg/dL — ABNORMAL HIGH (ref 65–99)

## 2016-03-20 LAB — COMPREHENSIVE METABOLIC PANEL
ALT: 22 U/L (ref 17–63)
AST: 23 U/L (ref 15–41)
Albumin: 2.3 g/dL — ABNORMAL LOW (ref 3.5–5.0)
Alkaline Phosphatase: 230 U/L — ABNORMAL HIGH (ref 38–126)
Anion gap: 10 (ref 5–15)
BUN: 27 mg/dL — ABNORMAL HIGH (ref 6–20)
CO2: 28 mmol/L (ref 22–32)
Calcium: 8.5 mg/dL — ABNORMAL LOW (ref 8.9–10.3)
Chloride: 93 mmol/L — ABNORMAL LOW (ref 101–111)
Creatinine, Ser: 1.04 mg/dL (ref 0.61–1.24)
GFR calc Af Amer: >60 mL/min (ref >60)
GFR calc non Af Amer: >60 mL/min (ref >60)
Glucose, Bld: 104 mg/dL — ABNORMAL HIGH (ref 65–99)
Potassium: 3.9 mmol/L (ref 3.5–5.1)
Sodium: 131 mmol/L — ABNORMAL LOW (ref 135–145)
Total Bilirubin: 0.6 mg/dL (ref 0.3–1.2)
Total Protein: 5.1 g/dL — ABNORMAL LOW (ref 6.5–8.1)

## 2016-03-20 LAB — CARBOXYHEMOGLOBIN
CARBOXYHEMOGLOBIN: 1.8 % — AB (ref 0.5–1.5)
METHEMOGLOBIN: 0.8 % (ref 0.0–1.5)
O2 SAT: 49.1 %
Total hemoglobin: 9 g/dL — ABNORMAL LOW (ref 12.0–16.0)

## 2016-03-20 LAB — CBC
HCT: 26.7 % — ABNORMAL LOW (ref 39.0–52.0)
Hemoglobin: 8.7 g/dL — ABNORMAL LOW (ref 13.0–17.0)
MCH: 29 pg (ref 26.0–34.0)
MCHC: 32.6 g/dL (ref 30.0–36.0)
MCV: 89 fL (ref 78.0–100.0)
Platelets: 266 10*3/uL (ref 150–400)
RBC: 3 MIL/uL — ABNORMAL LOW (ref 4.22–5.81)
RDW: 13.1 % (ref 11.5–15.5)
WBC: 11.4 10*3/uL — ABNORMAL HIGH (ref 4.0–10.5)

## 2016-03-20 MED ORDER — FUROSEMIDE 40 MG PO TABS
40.0000 mg | ORAL_TABLET | Freq: Every day | ORAL | Status: DC
  Administered 2016-03-20 – 2016-03-21 (×2): 40 mg via ORAL
  Filled 2016-03-20 (×2): qty 1

## 2016-03-20 MED ORDER — ALBUMIN HUMAN 25 % IV SOLN
12.5000 g | Freq: Four times a day (QID) | INTRAVENOUS | Status: AC
  Administered 2016-03-20 (×2): 12.5 g via INTRAVENOUS
  Filled 2016-03-20 (×2): qty 50

## 2016-03-20 MED ORDER — DOPAMINE-DEXTROSE 3.2-5 MG/ML-% IV SOLN
2.0000 ug/kg/min | INTRAVENOUS | Status: DC
  Administered 2016-03-20: 2 ug/kg/min via INTRAVENOUS
  Filled 2016-03-20: qty 250

## 2016-03-20 NOTE — Progress Notes (Signed)
Inpatient Diabetes Program Recommendations  AACE/ADA: New Consensus Statement on Inpatient Glycemic Control (2015)  Target Ranges:  Prepandial:   less than 140 mg/dL      Peak postprandial:   less than 180 mg/dL (1-2 hours)      Critically ill patients:  140 - 180 mg/dL   Results for William [name], William [name] (MRN [id number]) as of 03/20/2016 10:48  Ref. Range 03/19/2016 09:03 03/19/2016 12:41 03/19/2016 16:47 03/19/2016 21:30 03/20/2016 07:27  Glucose-Capillary Latest Ref Range: 65 - 99 mg/dL 67 119 (H) 120 (H) 152 (H) 85   Review of Glycemic Control  Current orders for Inpatient glycemic control: Levemir 12 units BID, Novolog 0-15 units TID with meals, Novolog 0-5 units QHS  Inpatient Diabetes Program Recommendations: Insulin - Basal: Fasting glucose 67 mg/dl on 03/19/16 and patient only received one does of Levemir yesterday (did NOT receive morning dose of Levemir on 03/19/16). Fasting glucose 85 mg/dl this morning. Please consider decreasing Levemir to 9 units BID.  Thanks, Barnie Alderman, RN, MSN, CDE Diabetes Coordinator Inpatient Diabetes Program [phone] (Team Pager from [place] to [place]) [phone] ([facility] office) [phone] [facility] office) [phone] [facility] office)

## 2016-03-20 NOTE — Significant Event (Signed)
Dopamine decreased down to 1.91mcg from 45mcg per verbal order from Dr. Prescott Gum.

## 2016-03-20 NOTE — Progress Notes (Signed)
7 Days Post-Op Procedure(s) (LRB): CORONARY ARTERY BYPASS GRAFTING (CABG) x 1 (SVG to OM) with EVH from [place] SAPHENOUS VEIN (N/A) VENTRICULAR SEPTAL DEFECT (VSD) REPAIR (N/A) TRANSESOPHAGEAL ECHOCARDIOGRAM (TEE) (N/A) Subjective: EF .45  remains on low dose milrinone Gangrene R great toe - M Duda to evaluate, cont iv maxepime, vanco Objective: Vital signs in last 24 hours: Temp:  [97.5 F (36.4 C)-98.7 F (37.1 C)] 97.5 F (36.4 C) (09/19 1954) Pulse Rate:  [94-116] 111 (09/19 1300) Cardiac Rhythm: Normal sinus rhythm (09/19 0755) Resp:  [11-26] 16 (09/19 1800) BP: (83-103)/(54-90) 103/90 (09/19 1700) SpO2:  [94 %-100 %] 98 % (09/19 2028) Weight:  [174 lb 9.7 oz (79.2 kg)] 174 lb 9.7 oz (79.2 kg) (09/19 0630)  Hemodynamic parameters for last 24 hours:  nsr  Intake/Output from previous day: 09/18 0701 - 09/19 0700 In: 1129.1 [P.O.:960; I.V.:69.1; IV Piggyback:100] Out: 2850 [Urine:2850] Intake/Output this shift: No intake/output data recorded.       Exam    General- alert and comfortable   Lungs- clear without rales, wheezes   Cor- regular rate and rhythm, no murmur , gallop   Abdomen- soft, non-tender   Extremities - warm, non-tender, minimal edema R foot withngangrene   Neuro- oriented, appropriate, no focal weakness   Lab Results:  Recent Labs  03/19/16 0454 03/20/16 0410  WBC 15.6* 11.4*  HGB 10.1* 8.7*  HCT 31.0* 26.7*  PLT 266 266   BMET:  Recent Labs  03/19/16 0454 03/20/16 0410  NA 132* 131*  K 4.3 3.9  CL 98* 93*  CO2 23 28  GLUCOSE 89 104*  BUN 26* 27*  CREATININE 0.92 1.04  CALCIUM 8.6* 8.5*    PT/INR: No results for input(s): LABPROT, INR in the last 72 hours. ABG    Component Value Date/Time   PHART 7.427 03/16/2016 0420   HCO3 25.3 03/16/2016 0420   TCO2 27 03/18/2016 1716   ACIDBASEDEF 2.0 03/14/2016 1715   O2SAT 66.1 03/20/2016 1300   CBG (last 3)   Recent Labs  03/20/16 0727 03/20/16 1332 03/20/16 1821  GLUCAP  85 209* 92    Assessment/Plan: S/P Procedure(s) (LRB): CORONARY ARTERY BYPASS GRAFTING (CABG) x 1 (SVG to OM) with EVH from [place] (N/A) VENTRICULAR SEPTAL DEFECT (VSD) REPAIR (N/A) TRANSESOPHAGEAL ECHOCARDIOGRAM (TEE) (N/A) Wean inotropes slowly- follow with Co-ox sats   LOS: 7 days    Tharon Aquas Trigt III 03/20/2016

## 2016-03-20 NOTE — Significant Event (Addendum)
Manual entry of co-ox as system is down and will import into EPIC several hours later.  Total Hemoglobin 9.9 Carboxyhemoglobin: 1.4 Methemoglobin 1.1  O2 saturation: 66.1%

## 2016-03-20 NOTE — Progress Notes (Signed)
Subjective: Up in chair no complaints   Objective: Vital signs in last 24 hours: Temp:  [96.1 F (35.6 C)-98.7 F (37.1 C)] 98.7 F (37.1 C) (09/19 1552) Pulse Rate:  [94-116] 111 (09/19 1300) Resp:  [11-26] 18 (09/19 1602) BP: (83-116)/(54-73) 89/58 (09/19 1602) SpO2:  [94 %-100 %] 100 % (09/19 1600) Weight:  [174 lb 9.7 oz (79.2 kg)] 174 lb 9.7 oz (79.2 kg) (09/19 0630) Weight change: -3 lb 8.4 oz (-1.6 kg) Last BM Date: 03/17/16 Intake/Output from previous day:  -2119 09/18 0701 - 09/19 0700 In: 1129.1 [P.O.:960; I.V.:69.1; IV Piggyback:100] Out: 2850 [Urine:2850] Intake/Output this shift: Total I/O In: 506.9 [P.O.:360; I.V.:46.9; IV Piggyback:100] Out: 950 [Urine:950]  [id number] affect, NAD Skin:Warm and dry, brisk capillary refill HEENT:normocephalic, sclera clear, mucus membranes moist Neck:supple, no JVD Heart:S1S2 RRR without murmur, gallup, rub or click Lungs:diminished without rales, rhonchi, or wheezes [id number], non tender, + BS, do not palpate liver spleen or masses Ext:1+ lower ext edema, 2+ pedal pulses, 2+ radial pulses Neuro:alert and oriented, MAE, follows commands, + facial symmetry Tele:  SR to ST    Lab Results:  Recent Labs  03/19/16 0454 03/20/16 0410  WBC 15.6* 11.4*  HGB 10.1* 8.7*  HCT 31.0* 26.7*  PLT 266 266   BMET  Recent Labs  03/19/16 0454 03/20/16 0410  NA 132* 131*  K 4.3 3.9  CL 98* 93*  CO2 23 28  GLUCOSE 89 104*  BUN 26* 27*  CREATININE 0.92 1.04  CALCIUM 8.6* 8.5*   No results for input(s): TROPONINI in the last 72 hours.  Invalid input(s): CK, MB  Lab Results  Component Value Date   CHOL 96 03/13/2016   HDL 23 (L) 03/13/2016   LDLCALC 44 03/13/2016   TRIG 146 03/13/2016   CHOLHDL 4.2 03/13/2016   Lab Results  Component Value Date   HGBA1C 10.7 (H) 03/13/2016     Lab Results  Component Value Date   TSH 1.885 07/10/2011    Hepatic Function Panel  Recent Labs   03/20/16 0410  PROT 5.1*  ALBUMIN 2.3*  AST 23  ALT 22  ALKPHOS 230*  BILITOT 0.6   No results for input(s): CHOL in the last 72 hours. No results for input(s): PROTIME in the last 72 hours.     Studies/Results: Dg Chest Port 1 View  Result Date: 03/20/2016 CLINICAL DATA:  Status post coronary bypass grafting, chest pain EXAM: PORTABLE CHEST 1 VIEW COMPARISON:  03/19/2016 FINDINGS: Cardiac shadow remains enlarged. A left PICC line is again noted in satisfactory position. Stable vascular congestion is noted with right-sided pleural effusion and right basilar atelectasis. No new focal abnormality is seen. IMPRESSION: No change from the previous exam. Electronically Signed   By: Inez Catalina M.D.   On: 03/20/2016 07:43   Dg Chest Port 1 View  Result Date: 03/19/2016 CLINICAL DATA:  65 year old male post CABG and VSD repair. Subsequent encounter. EXAM: PORTABLE CHEST 1 VIEW COMPARISON:  03/18/2016 chest x-ray. FINDINGS: Post median sternotomy/ CABG.  Heart size top-normal. Left central line tip mid to distal superior vena cava. Pulmonary vascular congestion minimally changed. Right base subsegmental atelectasis/small pleural effusions stable. No pneumothorax. Calcified mildly tortuous aorta. IMPRESSION: Pulmonary vascular congestion minimally changed. Right base subsegmental atelectasis/small pleural effusions stable. Aortic atherosclerosis. Electronically Signed   By: Genia Del M.D.   On: 03/19/2016 07:51   Dg Foot Complete Right  Result Date: 03/19/2016 CLINICAL DATA:  Diabetic ulcer  bottom of right great toe EXAM: RIGHT FOOT COMPLETE - 3+ VIEW COMPARISON:  02/29/2016 FINDINGS: No radiographic changes of osteomyelitis. Soft tissue swelling within the right great toe. No fracture, subluxation or dislocation. IMPRESSION: No acute bony abnormality. No radiographic changes of osteomyelitis. Electronically Signed   By: Rolm Baptise M.D.   On: 03/19/2016 10:31    Medications: I have reviewed  the patient's current medications. Scheduled Meds: . albumin human  12.5 g Intravenous Q6H  . amiodarone  200 mg Oral BID  . aspirin EC  325 mg Oral Daily   Or  . aspirin  324 mg Per Tube Daily  . bisacodyl  10 mg Oral Daily  . ceFEPime (MAXIPIME) IV  1 g Intravenous Q12H  . digoxin  0.125 mg Oral Daily  . docusate sodium  200 mg Oral Daily  . enoxaparin (LOVENOX) injection  40 mg Subcutaneous Q24H  . furosemide  40 mg Oral Daily  . Influenza vac split quadrivalent PF  0.5 mL Intramuscular Once  . insulin aspart  0-15 Units Subcutaneous TID WC  . insulin aspart  0-5 Units Subcutaneous QHS  . insulin detemir  12 Units Subcutaneous BID  . mouth rinse  15 mL Mouth Rinse BID  . metoCLOPramide (REGLAN) injection  10 mg Intravenous Q6H  . mometasone-formoterol  2 puff Inhalation BID  . nicotine  14 mg Transdermal QHS  . pantoprazole  40 mg Oral Daily  . rosuvastatin  40 mg Oral q1800  . sodium chloride flush  10-40 mL Intracatheter Q12H  . spironolactone  25 mg Oral Daily   Continuous Infusions: . sodium chloride Stopped (03/14/16 1000)  . DOPamine 1.5 mcg/kg/min (03/20/16 1600)  . milrinone 0.125 mcg/kg/min (03/20/16 1600)   PRN Meds:.ondansetron (ZOFRAN) IV, oxyCODONE, sodium chloride flush, traMADol  Assessment/Plan: Principal Problem:   Acute MI, inferoposterior wall, initial episode of care [facility]) Active Problems:   DM (diabetes mellitus), type 2, uncontrolled ([name])   Hyperlipidemia   MI (myocardial infarction) ([name])   Paroxysmal atrial fibrillation ([name])   CAD (coronary artery disease), native coronary artery   VSD (ventricular septal defect)   Hx of CABG  POD #7CABG, repair of VSD  1. CAD; S/P IMI with total RCA with necrotic muscle in RCA territory, not bypassed and SVG to OM --co-ox dropped and pt placed on dopamine - improved Co-ox and then HR increased so dopamine rate decreased. Also milrinone infusing.  2. Post MI VSD; s/p repair 3. Post PAF; now maintaining  NSR on oral amiodarone 200 mg bid 4. Hypotension on milrinone 5. Anemia: H/H today 8.7/26.7 6. Hyperlipidemia: LDL 44 on crestor 40 mg   LOS: 7 days   Time spent with pt. : 10 minutes. Cecilie Kicks  Nurse Practitioner Certified Pager [phone] or after 5pm and on weekends call [phone] 03/20/2016, 4:38 PM   Patient seen and examined. Agree with assessment and plan. No chest pain. Maintaining sinus rhythm but tachycardic at 110 range on amidarone 200 mg bid. Currently on dopamine and milrinone; no longer on coreg. BP range 89-103.   Troy Sine, MD, [facility] 03/20/2016 6:39 PM

## 2016-03-21 DIAGNOSIS — I5021 Acute systolic (congestive) heart failure: Secondary | ICD-10-CM

## 2016-03-21 DIAGNOSIS — R570 Cardiogenic shock: Secondary | ICD-10-CM

## 2016-03-21 DIAGNOSIS — E8809 Other disorders of plasma-protein metabolism, not elsewhere classified: Secondary | ICD-10-CM

## 2016-03-21 LAB — CARBOXYHEMOGLOBIN
CARBOXYHEMOGLOBIN: 1.6 % — AB (ref 0.5–1.5)
CARBOXYHEMOGLOBIN: 1.3 % (ref 0.5–1.5)
Carboxyhemoglobin: 1.4 % (ref 0.5–1.5)
Carboxyhemoglobin: 1.4 % (ref 0.5–1.5)
METHEMOGLOBIN: 0.9 % (ref 0.0–1.5)
METHEMOGLOBIN: 1 % (ref 0.0–1.5)
Methemoglobin: 1.1 % (ref 0.0–1.5)
Methemoglobin: 1.1 % (ref 0.0–1.5)
O2 SAT: 51.8 %
O2 SAT: 50.6 %
O2 Saturation: 66.1 %
O2 Saturation: 91.3 %
TOTAL HEMOGLOBIN: 9.4 g/dL — AB (ref 12.0–16.0)
Total hemoglobin: 9.9 g/dL — ABNORMAL LOW (ref 12.0–16.0)
Total hemoglobin: 10.1 g/dL — ABNORMAL LOW (ref 12.0–16.0)
Total hemoglobin: 10.7 g/dL — ABNORMAL LOW (ref 12.0–16.0)

## 2016-03-21 LAB — POCT I-STAT, CHEM 8
BUN: 24 mg/dL — AB (ref 6–20)
CALCIUM ION: 1.19 mmol/L (ref 1.15–1.40)
CREATININE: 1 mg/dL (ref 0.61–1.24)
Chloride: 93 mmol/L — ABNORMAL LOW (ref 101–111)
Glucose, Bld: 100 mg/dL — ABNORMAL HIGH (ref 65–99)
HEMATOCRIT: 30 % — AB (ref 39.0–52.0)
HEMOGLOBIN: 10.2 g/dL — AB (ref 13.0–17.0)
Potassium: 3.8 mmol/L (ref 3.5–5.1)
SODIUM: 134 mmol/L — AB (ref 135–145)
TCO2: 28 mmol/L (ref 0–100)

## 2016-03-21 LAB — GLUCOSE, CAPILLARY
Glucose-Capillary: 105 mg/dL — ABNORMAL HIGH (ref 65–99)
Glucose-Capillary: 155 mg/dL — ABNORMAL HIGH (ref 65–99)
Glucose-Capillary: 129 mg/dL — ABNORMAL HIGH (ref 65–99)
Glucose-Capillary: 152 mg/dL — ABNORMAL HIGH (ref 65–99)

## 2016-03-21 MED ORDER — METOLAZONE 5 MG PO TABS
2.5000 mg | ORAL_TABLET | Freq: Every day | ORAL | Status: DC
  Administered 2016-03-21: 2.5 mg via ORAL
  Filled 2016-03-21: qty 1

## 2016-03-21 MED ORDER — NOREPINEPHRINE BITARTRATE 1 MG/ML IV SOLN
5.0000 ug/min | INTRAVENOUS | Status: DC
  Administered 2016-03-21: 5 ug/min via INTRAVENOUS
  Filled 2016-03-21: qty 16

## 2016-03-21 MED ORDER — DEXTROSE 5 % IV SOLN
7.0000 mg/h | INTRAVENOUS | Status: DC
  Administered 2016-03-21: 10 mg/h via INTRAVENOUS
  Filled 2016-03-21 (×2): qty 25

## 2016-03-21 MED ORDER — POTASSIUM CHLORIDE 10 MEQ/50ML IV SOLN
10.0000 meq | INTRAVENOUS | Status: AC
  Administered 2016-03-21 (×3): 10 meq via INTRAVENOUS
  Filled 2016-03-21: qty 50

## 2016-03-21 MED ORDER — FUROSEMIDE 10 MG/ML IJ SOLN
80.0000 mg | Freq: Once | INTRAMUSCULAR | Status: AC
  Administered 2016-03-21: 80 mg via INTRAVENOUS
  Filled 2016-03-21: qty 8

## 2016-03-21 NOTE — Progress Notes (Signed)
Pt transferred to [facility] with SCDs, personal belongings, etc. 2H staff to assume care of pt.  Sherlie Ban, RN

## 2016-03-21 NOTE — Consult Note (Signed)
ORTHOPAEDIC CONSULTATION  REQUESTING PHYSICIAN: Ivin Poot, MD  Chief Complaint: Gangrenous right great toe with cellulitis right forefoot  HPI: William [name] is a 65 y.o. male who presents with gangrenous right great toe. Patient has been treated by podiatry and patient states that the great toe ulceration has progressively gotten worse. Patient states he had his heart attack after his toe started ulcerating. Patient is status post coronary artery bypass surgery.  Past Medical History:  Diagnosis Date  . Diabetes mellitus without complication ([place])   . Hyperlipidemia   . Hypertension    Past Surgical History:  Procedure Laterality Date  . CARDIAC CATHETERIZATION N/A 03/13/2016   Procedure: Right/Left Heart Cath and Coronary Angiography;  Surgeon: Sherren Mocha, MD;  Location: [place] CV LAB;  Service: Cardiovascular;  Laterality: N/A;  . CARDIAC CATHETERIZATION N/A 03/13/2016   Procedure: IABP Insertion;  Surgeon: Sherren Mocha, MD;  Location: [place] CV [facility];  Service: Cardiovascular;  Laterality: N/A;  . CORONARY ARTERY BYPASS GRAFT N/A 03/13/2016   Procedure: CORONARY ARTERY BYPASS GRAFTING (CABG) x 1 (SVG to OM) with EVH from [place];  Surgeon: Ivin Poot, MD;  Location: [place];  Service: Open Heart Surgery;  Laterality: N/A;  . TEE WITHOUT CARDIOVERSION N/A 03/13/2016   Procedure: TRANSESOPHAGEAL ECHOCARDIOGRAM (TEE);  Surgeon: Ivin Poot, MD;  Location: [place];  Service: Open Heart Surgery;  Laterality: N/A;  . VSD REPAIR N/A 03/13/2016   Procedure: VENTRICULAR SEPTAL DEFECT (VSD) REPAIR;  Surgeon: Ivin Poot, MD;  Location: [place];  Service: Open Heart Surgery;  Laterality: N/A;   Social History   Social History  . Marital status: Married    Spouse name: Lavell Luster  . Number of children: N/A  . Years of education: N/A   Social History Main Topics  . Smoking status: Current Every Day Smoker    Packs/day: 0.50    Types: Cigarettes    . Smokeless tobacco: Never Used  . Alcohol use No  . Drug use: No  . Sexual activity: Not Asked   Other Topics Concern  . None   Social History Narrative  . None   History reviewed. No pertinent family history. - negative except otherwise stated in the family history section Allergies  Allergen Reactions  . Morphine And Related Other (See Comments)    Pt said it was too much    Prior to Admission medications   Medication Sig Start Date End Date Taking? Authorizing Provider  atorvastatin (LIPITOR) 20 MG tablet Take 20 mg by mouth at bedtime.   Yes Historical Provider, MD  canagliflozin (INVOKANA) 300 MG TABS tablet Take 300 mg by mouth daily before breakfast.   Yes Historical Provider, MD  ibuprofen (ADVIL,MOTRIN) 200 MG tablet Take 200 mg by mouth every 6 (six) hours as needed. Take 1-2 pills for pain    Yes Historical Provider, MD  insulin detemir (LEVEMIR) 100 UNIT/ML injection Inject 40-50 Units into the skin 2 (two) times daily. Take 50 every morning  Take 40 units every evening   Yes Historical Provider, MD  lisinopril (PRINIVIL,ZESTRIL) 10 MG tablet Take 1 tablet (10 mg total) by mouth daily. 07/06/13  Yes Gerda Diss, DO  meloxicam (MOBIC) 7.5 MG tablet Take 7.5 mg by mouth 2 (two) times daily.   Yes Historical Provider, MD  metFORMIN (GLUCOPHAGE) 1000 MG tablet Take 1 tablet (1,000 mg total) by mouth 2 (two) times daily with a meal. 07/06/13  Yes Juanda Bond  Paulla Fore, DO  pantoprazole (PROTONIX) 40 MG tablet Take 40 mg by mouth daily.   Yes Historical Provider, MD  pramipexole (MIRAPEX) 0.125 MG tablet Take 0.125 mg by mouth at bedtime.   Yes Historical Provider, MD  omeprazole (PRILOSEC) 40 MG capsule Take 1 capsule (40 mg total) by mouth daily. 01/21/12 01/20/13  Gerda Diss, DO   Dg Chest Port 1 View  Result Date: 03/20/2016 CLINICAL DATA:  Status post coronary bypass grafting, chest pain EXAM: PORTABLE CHEST 1 VIEW COMPARISON:  03/19/2016 FINDINGS: Cardiac shadow remains  enlarged. A left PICC line is again noted in satisfactory position. Stable vascular congestion is noted with right-sided pleural effusion and right basilar atelectasis. No new focal abnormality is seen. IMPRESSION: No change from the previous exam. Electronically Signed   By: Inez Catalina M.D.   On: 03/20/2016 07:43   Dg Foot Complete Right  Result Date: 03/19/2016 CLINICAL DATA:  Diabetic ulcer bottom of right great toe EXAM: RIGHT FOOT COMPLETE - 3+ VIEW COMPARISON:  02/29/2016 FINDINGS: No radiographic changes of osteomyelitis. Soft tissue swelling within the right great toe. No fracture, subluxation or dislocation. IMPRESSION: No acute bony abnormality. No radiographic changes of osteomyelitis. Electronically Signed   By: Rolm Baptise M.D.   On: 03/19/2016 10:31   - pertinent xrays, CT, MRI studies were reviewed and independently interpreted  Positive ROS: All other systems have been reviewed and were otherwise negative with the exception of those mentioned in the HPI and as above.  Physical Exam: General: Alert, no acute distress Psychiatric: Patient is competent for consent with normal mood and affect Lymphatic: No axillary or cervical lymphadenopathy Cardiovascular: No pedal edema Respiratory: No cyanosis, no use of accessory musculature GI: No organomegaly, abdomen is soft and non-tender  Skin: Patient has cellulitis to the midfoot of the right foot. Patient has gangrenous changes of the great toe which extends to the base of the second toe.   Neurologic: Patient does not have protective sensation bilateral lower extremities.   MUSCULOSKELETAL:  Patient does not have a palpable pulse but by report she does have an ankle-brachial indices of 0.8. Radiographs do not show any chronic osteomyelitis.  Assessment: Assessment: Gangrene right great toe with cellulitis right forefoot  Plan: Plan: We'll plan for a first and second Ray amputation on Friday. Discussed with the patient that  he may need further surgery but we will proceed with a minimal amount of surgery at this time to try and resolve his infection.  Thank you for the consult and the opportunity to see William [name], [place] [phone] 7:27 AM

## 2016-03-21 NOTE — Care Management Important Message (Signed)
Important Message  Patient Details  Name: William [name] MRN: [id number] Date of Birth: 19-Dec-1950   Medicare Important Message Given:  Yes    Hiroshi Krummel Abena 03/21/2016, 9:22 AM

## 2016-03-21 NOTE — Progress Notes (Signed)
  CO-OX repeated 51% on Norepi 5 mcg + Milrinone 0.25 mcg. Diuresing with lasix drip at 10 mg per hour. SBP improved.   CVP intially 11-12 now down 6-7. Significant leg edema noted. RV on ECHO was ok.   Cut back lasix to 7 mg per hour. Continue norepi and milrinone at current dose.   Repeat CO-OX in am.    Discussed with Dr Haroldine Laws and he agrees with plan.   Amy Clegg NP-C  4:00 PM

## 2016-03-21 NOTE — Consult Note (Addendum)
Advanced Heart Failure Rounding Note   Subjective:   Asked by Dr Lucianne Lei trigt to follow for cardiogenic shock and marked volume overload. He is post op day 8 S/P CABG x1 and VSD repair.   All drips have been weaned off except milrinone 0.125 mcg. Todays CO-OX 52%.   Complaining of R foot pain. Denies CP.    Objective:   Weight Range:  Vital Signs:   Temp:  [97.5 F (36.4 C)-98.7 F (37.1 C)] 98.4 F (36.9 C) (09/20 1147) Pulse Rate:  [101-110] 103 (09/20 1112) Resp:  [11-23] 22 (09/20 1200) BP: (82-120)/(57-90) 120/73 (09/20 1112) SpO2:  [91 %-100 %] 100 % (09/20 1200) Weight:  [172 lb 2.9 oz (78.1 kg)] 172 lb 2.9 oz (78.1 kg) (09/20 0450) Last BM Date: 03/17/16  Weight change: Filed Weights   03/19/16 0500 03/20/16 0630 03/21/16 0450  Weight: 178 lb 2.1 oz (80.8 kg) 174 lb 9.7 oz (79.2 kg) 172 lb 2.9 oz (78.1 kg)    Intake/Output:   Intake/Output Summary (Last 24 hours) at 03/21/16 1330 Last data filed at 03/21/16 1300  Gross per 24 hour  Intake           890.88 ml  Output             3510 ml  Net         -2619.12 ml     Physical Exam: General:  Pale. No resp difficulty. Sitting in the chair HEENT: normal Neck: supple. JVP to jaw. . Carotids 2+ bilat; no bruits. No lymphadenopathy or thryomegaly appreciated. Cor: PMI nondisplaced. Regular rate & rhythm. No rubs, gallops or murmurs. Lungs: clear Abdomen: soft, nontender, nondistended. No hepatosplenomegaly. No bruits or masses. Good bowel sounds. Extremities: no cyanosis, clubbing, rash, R and LLE 3+  Edema. R great toe necrotic Neuro: alert & orientedx3, cranial nerves grossly intact. moves all 4 extremities w/o difficulty. Affect pleasant  Telemetry: Sinus Tach 100s   Labs: Basic Metabolic Panel:  Recent Labs Lab 03/14/16 1541  03/16/16 2224 03/17/16 0400  03/18/16 0438 03/18/16 1716 03/19/16 0454 03/20/16 0410 03/21/16 0857  NA  --   < > 133* 135  < > 130* 131* 132* 131* 134*  K  --   < > 3.9  3.6  < > 4.6 4.6 4.3 3.9 3.8  CL  --   < > 99* 99*  < > 97* 95* 98* 93* 93*  CO2  --   < > 25 27  --  27  --  23 28  --   GLUCOSE  --   < > 114* 52*  < > 192* 125* 89 104* 100*  BUN  --   < > 29* 29*  < > 27* 28* 26* 27* 24*  CREATININE 1.21  < > 1.02 0.93  < > 0.91 1.00 0.92 1.04 1.00  CALCIUM  --   < > 8.3* 8.5*  --  8.2*  --  8.6* 8.5*  --   MG 2.9*  --   --   --   --   --   --   --   --   --   < > = values in this interval not displayed.  Liver Function Tests:  Recent Labs Lab 03/15/16 0432 03/16/16 0400 03/17/16 0400 03/19/16 0454 03/20/16 0410  AST 42* 31 25 29 23   ALT 18 20 18 23 22   ALKPHOS 92 97 134* 233* 230*  BILITOT 1.2 0.9 0.6 0.8 0.6  PROT 4.9* 5.1* 5.2* 5.5* 5.1*  ALBUMIN 2.6* 2.6* 2.4* 2.5* 2.3*   No results for input(s): LIPASE, AMYLASE in the last 168 hours. No results for input(s): AMMONIA in the last 168 hours.  CBC:  Recent Labs Lab 03/16/16 1657 03/17/16 0400  03/18/16 0438 03/18/16 1716 03/19/16 0454 03/20/16 0410 03/21/16 0857  WBC 22.6* 18.0*  --  13.8*  --  15.6* 11.4*  --   HGB 9.6* 9.3*  < > 8.9* 9.5* 10.1* 8.7* 10.2*  HCT 29.3* 28.6*  < > 27.3* 28.0* 31.0* 26.7* 30.0*  MCV 88.8 89.4  --  89.5  --  89.3 89.0  --   PLT 180 202  --  221  --  266 266  --   < > = values in this interval not displayed.  Cardiac Enzymes: No results for input(s): CKTOTAL, CKMB, CKMBINDEX, TROPONINI in the last 168 hours.  BNP: BNP (last 3 results) No results for input(s): BNP in the last 8760 hours.  ProBNP (last 3 results) No results for input(s): PROBNP in the last 8760 hours.    Other results:  Imaging: Dg Chest Port 1 View  Result Date: 03/20/2016 CLINICAL DATA:  Status post coronary bypass grafting, chest pain EXAM: PORTABLE CHEST 1 VIEW COMPARISON:  03/19/2016 FINDINGS: Cardiac shadow remains enlarged. A left PICC line is again noted in satisfactory position. Stable vascular congestion is noted with right-sided pleural effusion and right  basilar atelectasis. No new focal abnormality is seen. IMPRESSION: No change from the previous exam. Electronically Signed   By: Inez Catalina M.D.   On: 03/20/2016 07:43      Medications:     Scheduled Medications: . amiodarone  200 mg Oral BID  . aspirin EC  325 mg Oral Daily   Or  . aspirin  324 mg Per Tube Daily  . bisacodyl  10 mg Oral Daily  . ceFEPime (MAXIPIME) IV  1 g Intravenous Q12H  . digoxin  0.125 mg Oral Daily  . docusate sodium  200 mg Oral Daily  . enoxaparin (LOVENOX) injection  40 mg Subcutaneous Q24H  . Influenza vac split quadrivalent PF  0.5 mL Intramuscular Once  . insulin aspart  0-15 Units Subcutaneous TID WC  . insulin aspart  0-5 Units Subcutaneous QHS  . insulin detemir  12 Units Subcutaneous BID  . mouth rinse  15 mL Mouth Rinse BID  . metoCLOPramide (REGLAN) injection  10 mg Intravenous Q6H  . mometasone-formoterol  2 puff Inhalation BID  . nicotine  14 mg Transdermal QHS  . pantoprazole  40 mg Oral Daily  . rosuvastatin  40 mg Oral q1800  . sodium chloride flush  10-40 mL Intracatheter Q12H  . spironolactone  25 mg Oral Daily     Infusions: . sodium chloride 250 mL (03/21/16 0940)  . furosemide (LASIX) infusion 10 mg/hr (03/21/16 1119)  . milrinone 0.25 mcg/kg/min (03/21/16 0751)  . norepinephrine (LEVOPHED) Adult infusion 5 mcg/min (03/21/16 1118)     PRN Medications:  ondansetron (ZOFRAN) IV, oxyCODONE, sodium chloride flush, traMADol   Assessment:  1. Inferiro STEMI CAD-->S/P CABG x1 VSD Repair 2. Cardiogenic Shock 3. Ischemic R Great Toe 4. Hyperlipidemia 5. DM Type II 6. PAF 7. Hypoalbumin-2.3    Plan/Discussion:   Post Op Day 8. S/P CABG x1 VSD repair.   Todays CO-OX is 52%. He is hypotensive with marked volume overload. Cardiogenic shock. Increase milrinone to 0.25 mcg and add 5 mcg norepi. Give 80 mg IV lasix then  add lasix drip 10 mg per hour. Check CVP. Continue digoxin 0.125 mg daily. Renal function stable.   Repeat  CO-OX at 1400.   Remains in Sinus Tach- on amio 200 mg twice daily.   Can transfer to SDU.  Length of Stay: [place] NP-C  03/21/2016, 1:30 PM  Advanced Heart Failure Team Pager [phone] (M-F; 7a - 4p)  Please contact [facility] for night-coverage after hours (4p -7a ) and weekends on [email or web address]  Patient seen and examined with Darrick Grinder, NP. We discussed all aspects of the encounter. I agree with the assessment and plan as stated above.   Echo from 9/18 reviewed personally. EF 40-45% with severe inferior HK. VSD patch ok. RV visually ok but suspect some dysfunction.   He is markedly volume overloaded with low co-ox. Will move to 2H start lasix gtt. Increase milrinone and add norepi for BP support. Suspect he will progress slowly. Follow renal function closely.   Maintaining NSR.   Cristobal Advani,MD 3:37 PM

## 2016-03-21 NOTE — Significant Event (Signed)
Questionable Co-Ox of 91.3% at 1343pm. Redraw and repeat co-ox. See result in EPIC.

## 2016-03-21 NOTE — Progress Notes (Signed)
EVENING ROUNDS NOTE :     [address]       [place] [address]             [phone]                 8 Days Post-Op Procedure(s) (LRB): CORONARY ARTERY BYPASS GRAFTING (CABG) x 1 (SVG to OM) with EVH from [place] (N/A) VENTRICULAR SEPTAL DEFECT (VSD) REPAIR (N/A) TRANSESOPHAGEAL ECHOCARDIOGRAM (TEE) (N/A)  Total Length of Stay:  LOS: 8 days  BP 110/66 (BP Location: Right Arm)   Pulse (!) 103   Temp 98.4 F (36.9 C) (Oral)   Resp 15   Ht 5\' 5"  (1.651 m)   Wt 172 lb 2.9 oz (78.1 kg)   SpO2 100%   BMI 28.65 kg/m   .Intake/Output      09/19 0701 - 09/20 0700 09/20 0701 - 09/21 0700   P.O. 600 240   I.V. (mL/kg) 131.9 (1.7) 137.8 (1.8)   IV Piggyback 150 200   Total Intake(mL/kg) 881.9 (11.3) 577.8 (7.4)   Urine (mL/kg/hr) 2800 (1.5) 3210 (3.6)   Total Output 2800 3210   Net -1918.1 -2632.2          . sodium chloride 250 mL (03/21/16 0940)  . furosemide (LASIX) infusion 7 mg/hr (03/21/16 1601)  . milrinone 0.25 mcg/kg/min (03/21/16 1820)  . norepinephrine (LEVOPHED) Adult infusion 5 mcg/min (03/21/16 1300)     Lab Results  Component Value Date   WBC 11.4 (H) 03/20/2016   HGB 10.2 (L) 03/21/2016   HCT 30.0 (L) 03/21/2016   PLT 266 03/20/2016   GLUCOSE 100 (H) 03/21/2016   CHOL 96 03/13/2016   TRIG 146 03/13/2016   HDL 23 (L) 03/13/2016   LDLCALC 44 03/13/2016   ALT 22 03/20/2016   AST 23 03/20/2016   NA 134 (L) 03/21/2016   K 3.8 03/21/2016   CL 93 (L) 03/21/2016   CREATININE 1.00 03/21/2016   BUN 24 (H) 03/21/2016   CO2 28 03/20/2016   TSH 1.885 07/10/2011   INR 1.33 03/15/2016   HGBA1C 10.7 (H) 03/13/2016   Up in chair , alert , feels well Back on pressors today: CO-OX repeated 51% on Norepi 5 mcg + Milrinone 0.25 mcg. Diuresing with lasix drip  Toe amputations friday  Grace Isaac MD  Beeper [phone] Office [phone] 03/21/2016 6:35 PM  Patient ID: [name], male   DOB: March 01, 1951, 65 y.o.   MRN:  [id number]

## 2016-03-21 NOTE — Progress Notes (Signed)
8 Days Post-Op Procedure(s) (LRB): CORONARY ARTERY BYPASS GRAFTING (CABG) x 1 (SVG to OM) with EVH from LEFT GREATER SAPHENOUS VEIN (N/A) VENTRICULAR SEPTAL DEFECT (VSD) REPAIR (N/A) TRANSESOPHAGEAL ECHOCARDIOGRAM (TEE) (N/A) Subjective:  Remains inotrope dependent after CABG, repair postMI VSD R foot resection of gangrene planned for Fri Will tx to 2 H stepdown for inotrope titration by HF service Objective: Vital signs in last 24 hours: Temp:  [97.5 F (36.4 C)-98.7 F (37.1 C)] 98.4 F (36.9 C) (09/20 0731) Pulse Rate:  [101-116] 105 (09/20 0600) Cardiac Rhythm: Sinus tachycardia (09/20 0000) Resp:  [11-23] 16 (09/20 0700) BP: (87-114)/(57-90) 114/68 (09/20 0600) SpO2:  [91 %-100 %] 91 % (09/20 0600) Weight:  [172 lb 2.9 oz (78.1 kg)] 172 lb 2.9 oz (78.1 kg) (09/20 0450)  Hemodynamic parameters for last 24 hours:  afebrile  Intake/Output from previous day: 09/19 0701 - 09/20 0700 In: 881.9 [P.O.:600; I.V.:131.9; IV Piggyback:150] Out: 2800 [Urine:2800] Intake/Output this shift: Total I/O In: 5.4 [I.V.:5.4] Out: 450 [Urine:450]  Wet gangrene of R foot nsr no murmur basilar scattered rales Perineal, scrotal edema  Lab Results:  Recent Labs  03/19/16 0454 03/20/16 0410  WBC 15.6* 11.4*  HGB 10.1* 8.7*  HCT 31.0* 26.7*  PLT 266 266   BMET:  Recent Labs  03/19/16 0454 03/20/16 0410  NA 132* 131*  K 4.3 3.9  CL 98* 93*  CO2 23 28  GLUCOSE 89 104*  BUN 26* 27*  CREATININE 0.92 1.04  CALCIUM 8.6* 8.5*    PT/INR: No results for input(s): LABPROT, INR in the last 72 hours. ABG    Component Value Date/Time   PHART 7.427 03/16/2016 0420   HCO3 25.3 03/16/2016 0420   TCO2 27 03/18/2016 1716   ACIDBASEDEF 2.0 03/14/2016 1715   O2SAT 51.8 03/21/2016 0412   CBG (last 3)   Recent Labs  03/20/16 1821 03/20/16 2209 03/21/16 0729  GLUCAP 92 132* 105*    Assessment/Plan: S/P Procedure(s) (LRB): CORONARY ARTERY BYPASS GRAFTING (CABG) x 1 (SVG to OM)  with EVH from [place] (N/A) VENTRICULAR SEPTAL DEFECT (VSD) REPAIR (N/A) TRANSESOPHAGEAL ECHOCARDIOGRAM (TEE) (N/A) HF consult R foot surgery by Dr Feliciana Rossetti - appreciate his consult No coumadin until he has completed foot procedures   LOS: 8 days    Tharon Aquas Trigt III 03/21/2016

## 2016-03-21 NOTE — Progress Notes (Signed)
Patient ID: William [name], male   DOB: 06-15-1951, 65 y.o.   MRN: [id number]   SICU Evening Rounds:   Hemodynamically stable on milrinone 0.125 and dop 1.5    Urine output good    CBC    Component Value Date/Time   WBC 11.4 (H) 03/20/2016 0410   RBC 3.00 (L) 03/20/2016 0410   HGB 8.7 (L) 03/20/2016 0410   HCT 26.7 (L) 03/20/2016 0410   PLT 266 03/20/2016 0410   MCV 89.0 03/20/2016 0410   MCH 29.0 03/20/2016 0410   MCHC 32.6 03/20/2016 0410   RDW 13.1 03/20/2016 0410     BMET    Component Value Date/Time   NA 131 (L) 03/20/2016 0410   K 3.9 03/20/2016 0410   CL 93 (L) 03/20/2016 0410   CO2 28 03/20/2016 0410   GLUCOSE 104 (H) 03/20/2016 0410   BUN 27 (H) 03/20/2016 0410   CREATININE 1.04 03/20/2016 0410   CREATININE 0.68 07/06/2013 1051   CALCIUM 8.5 (L) 03/20/2016 0410   GFRNONAA >60 03/20/2016 0410   GFRAA >60 03/20/2016 0410     A/P:  Stable postop course. Continue current plans

## 2016-03-21 NOTE — Progress Notes (Signed)
Physical Therapy Treatment Patient Details Name: William [name] MRN: [id number] DOB: 1951/03/14 Today's Date: Apr 19, 2016    History of Present Illness 65 yo s/p acute MI with CABGx1 and septal defect repair. Planned toe amputation 9/22 PMHx: DM, smoker, HTN, HLD, right great toe gangrene    PT Comments    Pt continues to mobilize well with cues for safety and assist for bed mobility. Pt educated for HEP and encouraged to continue throughout the day. Pt remains with stable HR at 103 and no pain. Will continue to follow  BP 82/70 before and 120/73 after ambulation  Follow Up Recommendations  Home health PT     Equipment Recommendations       Recommendations for Other Services       Precautions / Restrictions Precautions Precautions: Sternal Precaution Comments: pt able to state all precautions     Mobility  Bed Mobility Overal bed mobility: Needs Assistance Bed Mobility: Sit to Supine       Sit to supine: Min assist   General bed mobility comments: cues for sequence and assist to elevate legs onto surface  Transfers Overall transfer level: Needs assistance     Sit to Stand: Supervision         General transfer comment: cues for hand placement, pt maintaining precautions  Ambulation/Gait Ambulation/Gait assistance: Supervision Ambulation Distance (Feet): 200 Feet Assistive device: Rolling walker (2 wheeled) Gait Pattern/deviations: Step-through pattern;Decreased stride length   Gait velocity interpretation: Below normal speed for age/gender General Gait Details: decreased speed with steady gait with cues for posture   Stairs            Wheelchair Mobility    Modified Rankin (Stroke Patients Only)       Balance                                    Cognition Arousal/Alertness: Awake/alert Behavior During Therapy: WFL for tasks assessed/performed Overall Cognitive Status: Within Functional Limits for tasks assessed                      Exercises General Exercises - Lower Extremity Long Arc Quad: AROM;15 reps;Both;Seated Hip ABduction/ADduction: AROM;Both;Seated Hip Flexion/Marching: AROM;15 reps;Both;Seated    General Comments        Pertinent Vitals/Pain Pain Assessment: No/denies pain    Home Living                      Prior Function            PT Goals (current goals can now be found in the care plan section) Progress towards PT goals: Progressing toward goals    Frequency           PT Plan Current plan remains appropriate    Co-evaluation             End of Session   Activity Tolerance: Patient tolerated treatment well Patient left: in bed;with call bell/phone within reach     Time: 1055-1111 PT Time Calculation (min) (ACUTE ONLY): 16 min  Charges:  $Gait Training: 8-22 mins                    G Codes:      Melford Aase 2016/04/19, 11:15 AM Elwyn Reach, [place]

## 2016-03-22 ENCOUNTER — Inpatient Hospital Stay (HOSPITAL_COMMUNITY): Payer: Medicare Other

## 2016-03-22 ENCOUNTER — Other Ambulatory Visit (HOSPITAL_COMMUNITY): Payer: Self-pay | Admitting: Family

## 2016-03-22 LAB — GLUCOSE, CAPILLARY
GLUCOSE-CAPILLARY: 365 mg/dL — AB (ref 65–99)
Glucose-Capillary: 128 mg/dL — ABNORMAL HIGH (ref 65–99)
Glucose-Capillary: 158 mg/dL — ABNORMAL HIGH (ref 65–99)
Glucose-Capillary: 216 mg/dL — ABNORMAL HIGH (ref 65–99)

## 2016-03-22 LAB — CARBOXYHEMOGLOBIN
Carboxyhemoglobin: 1.9 % — ABNORMAL HIGH (ref 0.5–1.5)
Carboxyhemoglobin: 1.3 % (ref 0.5–1.5)
Methemoglobin: 1 % (ref 0.0–1.5)
Methemoglobin: 0.9 % (ref 0.0–1.5)
O2 Saturation: 60.6 %
O2 Saturation: 57.8 %
TOTAL HEMOGLOBIN: 10 g/dL — AB (ref 12.0–16.0)
Total hemoglobin: 10.1 g/dL — ABNORMAL LOW (ref 12.0–16.0)

## 2016-03-22 LAB — CBC
HCT: 31.7 % — ABNORMAL LOW (ref 39.0–52.0)
Hemoglobin: 10.4 g/dL — ABNORMAL LOW (ref 13.0–17.0)
MCH: 29 pg (ref 26.0–34.0)
MCHC: 32.8 g/dL (ref 30.0–36.0)
MCV: 88.3 fL (ref 78.0–100.0)
PLATELETS: 386 10*3/uL (ref 150–400)
RBC: 3.59 MIL/uL — AB (ref 4.22–5.81)
RDW: 13 % (ref 11.5–15.5)
WBC: 12.3 10*3/uL — ABNORMAL HIGH (ref 4.0–10.5)

## 2016-03-22 LAB — COMPREHENSIVE METABOLIC PANEL
ALK PHOS: 232 U/L — AB (ref 38–126)
ALT: 19 U/L (ref 17–63)
AST: 19 U/L (ref 15–41)
Albumin: 2.8 g/dL — ABNORMAL LOW (ref 3.5–5.0)
Anion gap: 12 (ref 5–15)
BUN: 26 mg/dL — ABNORMAL HIGH (ref 6–20)
CALCIUM: 9 mg/dL (ref 8.9–10.3)
CO2: 30 mmol/L (ref 22–32)
CREATININE: 1.22 mg/dL (ref 0.61–1.24)
Chloride: 89 mmol/L — ABNORMAL LOW (ref 101–111)
GFR calc non Af Amer: >60 mL/min (ref >60)
GLUCOSE: 183 mg/dL — AB (ref 65–99)
Potassium: 3.7 mmol/L (ref 3.5–5.1)
SODIUM: 131 mmol/L — AB (ref 135–145)
Total Bilirubin: 1 mg/dL (ref 0.3–1.2)
Total Protein: 5.8 g/dL — ABNORMAL LOW (ref 6.5–8.1)

## 2016-03-22 MED ORDER — NOREPINEPHRINE BITARTRATE 1 MG/ML IV SOLN
5.0000 ug/min | INTRAVENOUS | Status: DC
  Administered 2016-03-23: 5 ug/min via INTRAVENOUS
  Administered 2016-03-26: 4 ug/min via INTRAVENOUS
  Filled 2016-03-22 (×3): qty 16

## 2016-03-22 MED ORDER — FUROSEMIDE 40 MG PO TABS: 40.0000 mg | ORAL_TABLET | Freq: Two times a day (BID) | ORAL | Status: DC

## 2016-03-22 MED ORDER — TORSEMIDE 20 MG PO TABS
20.0000 mg | ORAL_TABLET | Freq: Two times a day (BID) | ORAL | Status: DC
  Administered 2016-03-22 – 2016-03-25 (×6): 20 mg via ORAL
  Filled 2016-03-22 (×6): qty 1

## 2016-03-22 NOTE — Progress Notes (Signed)
9 Days Post-Op Procedure(s) (LRB): CORONARY ARTERY BYPASS GRAFTING (CABG) x 1 (SVG to OM) with EVH from LEFT GREATER SAPHENOUS VEIN (N/A) VENTRICULAR SEPTAL DEFECT (VSD) REPAIR (N/A) TRANSESOPHAGEAL ECHOCARDIOGRAM (TEE) (N/A) Subjective: Stable on mil,norepi Perineal edema much improved For toe deletions tomorrow mid day  Objective: Vital signs in last 24 hours: Temp:  [97.7 F (36.5 C)-98.2 F (36.8 C)] 97.7 F (36.5 C) (09/21 1549) Pulse Rate:  [102-116] 111 (09/21 1549) Cardiac Rhythm: Sinus tachycardia (09/21 0800) Resp:  [13-21] 20 (09/21 1549) BP: (83-114)/(54-75) 93/58 (09/21 1600) SpO2:  [96 %-100 %] 99 % (09/21 1549) FiO2 (%):  [21 %-97 %] 21 % (09/21 0832) Weight:  [164 lb 8 oz (74.6 kg)] 164 lb 8 oz (74.6 kg) (09/21 0449)  Hemodynamic parameters for last 24 hours: CVP:  [3 mmHg-7 mmHg] 5 mmHg  Intake/Output from previous day: 09/20 0701 - 09/21 0700 In: 1161.4 [P.O.:600; I.V.:361.4; IV Piggyback:200] Out: 6960 [Urine:6960] Intake/Output this shift: Total I/O In: 94.4 [I.V.:94.4] Out: -       Exam    General- alert and comfortable   Lungs- clear without rales, wheezes   Cor- regular rate and rhythm, no murmur , gallop   Abdomen- soft, non-tender   Extremities - warm, non-tender, minimal edema   Neuro- oriented, appropriate, no focal weakness   Lab Results:  Recent Labs  03/20/16 0410 03/21/16 0857 03/22/16 0449  WBC 11.4*  --  12.3*  HGB 8.7* 10.2* 10.4*  HCT 26.7* 30.0* 31.7*  PLT 266  --  386   BMET:  Recent Labs  03/20/16 0410 03/21/16 0857 03/22/16 0449  NA 131* 134* 131*  K 3.9 3.8 3.7  CL 93* 93* 89*  CO2 28  --  30  GLUCOSE 104* 100* 183*  BUN 27* 24* 26*  CREATININE 1.04 1.00 1.22  CALCIUM 8.5*  --  9.0    PT/INR: No results for input(s): LABPROT, INR in the last 72 hours. ABG    Component Value Date/Time   PHART 7.427 03/16/2016 0420   HCO3 25.3 03/16/2016 0420   TCO2 28 03/21/2016 0857   ACIDBASEDEF 2.0 03/14/2016 1715    O2SAT 60.6 03/22/2016 0432   CBG (last 3)   Recent Labs  03/22/16 0825 03/22/16 1215 03/22/16 1605  GLUCAP 128* 158* 216*    Assessment/Plan: S/P Procedure(s) (LRB): CORONARY ARTERY BYPASS GRAFTING (CABG) x 1 (SVG to OM) with EVH from [place] (N/A) VENTRICULAR SEPTAL DEFECT (VSD) REPAIR (N/A) TRANSESOPHAGEAL ECHOCARDIOGRAM (TEE) (N/A) DC EPWs tomorrow   LOS: 9 days    William [name] 03/22/2016

## 2016-03-22 NOTE — Progress Notes (Signed)
Occupational Therapy Treatment Patient Details Name: Cashius Holmen MRN: [id number] DOB: May 19, 1951 Today's Date: 03/22/2016    History of present illness 65 yo s/p acute MI with CABGx1 and septal defect repair. Planned toe amputation 9/22 PMHx: DM, smoker, HTN, HLD, right great toe gangrene   OT comments  Pt is eager to get his toe amputation surgery done so he can go home. Moving well and demonstrating sternal precaution generalization this visit.  Follow Up Recommendations  No OT follow up    Equipment Recommendations       Recommendations for Other Services      Precautions / Restrictions Precautions Precautions: Sternal Precaution Comments: pt able to state and generalize all precautions  Restrictions Weight Bearing Restrictions: Yes Other Position/Activity Restrictions: sternal precautions       Mobility Bed Mobility               General bed mobility comments: pt sitting at EOB upon OT's arrival, reports routinely transferring from bed to [facility] to urinate as he has urgency  Transfers Overall transfer level: Needs assistance Equipment used: Rolling walker (2 wheeled) Transfers: Sit to/from [facility] Sit to Stand: Supervision Stand pivot transfers: Supervision       General transfer comment: placed hands on knees without cues    Balance     Sitting balance-Leahy Scale: Good       Standing balance-Leahy Scale: Fair                     ADL Overall ADL's : Needs assistance/impaired     Grooming: Supervision/safety;Standing;Wash/dry hands           Upper Body Dressing : Set up;Sitting   Lower Body Dressing: Moderate assistance;Sit to/from stand   Toilet Transfer: Supervision/safety;Ambulation;Comfort height toilet;RW Armed forces technical officer Details (indicate cue type and reason): stood to urinate Toileting- Water quality scientist and Hygiene: Supervision/safety (standing)       Functional mobility during ADLs:  Supervision/safety;Rolling walker General ADL Comments: continues to not be interested in AE for LB ADL      Vision                     Perception     Praxis      Cognition   Behavior During Therapy: [facility] for tasks assessed/performed Overall Cognitive Status: Within Functional Limits for tasks assessed                       Extremity/Trunk Assessment               Exercises     Shoulder Instructions       General Comments      Pertinent Vitals/ Pain       Pain Assessment: No/denies pain  Home Living                                          Prior Functioning/Environment              Frequency  Min 2X/week        Progress Toward Goals  OT Goals(current goals can now be found in the care plan section)  Progress towards OT goals: Progressing toward goals  Acute Rehab OT Goals Patient Stated Goal: return to mowing the yard Time For Goal Achievement: 03/26/16 Potential to Achieve Goals: Good  Plan Discharge plan remains  appropriate    Co-evaluation                 End of Session Equipment Utilized During Treatment: Gait belt;Rolling walker   Activity Tolerance Patient tolerated treatment well   Patient Left in bed (EOB)   Nurse Communication Mobility status        Time: 1140-1155 OT Time Calculation (min): 15 min  Charges: OT General Charges $OT Visit: 1 Procedure OT Treatments $Self Care/Home Management : 8-22 mins  Malka So 03/22/2016, 12:13 PM  [phone]

## 2016-03-22 NOTE — Progress Notes (Signed)
Advanced Heart Failure Rounding Note   Subjective:    Asked by Dr Prescott Gum to follow for cardiogenic shock and marked volume overload. He is post op day 9 S/P CABG x1 and VSD repair.   Yesterday milrinone increased to 0.25 mcg and norepi 5 mcg added. Diuresed with lasix drip. Brisk diuresis noted. Weight down 8 pounds. Todays CO-OX 61%.   Complaining of R foot pain. Denies SOB.     Objective:   Weight Range:  Vital Signs:   Temp:  [98 F (36.7 C)-98.4 F (36.9 C)] 98.1 F (36.7 C) (09/21 0449) Pulse Rate:  [103] 103 (09/20 1112) Resp:  [15-23] 16 (09/21 0449) BP: (82-120)/(53-75) 93/59 (09/21 0449) SpO2:  [98 %-100 %] 100 % (09/21 0449) FiO2 (%):  [97 %] 97 % (09/20 2209) Weight:  [164 lb 8 oz (74.6 kg)] 164 lb 8 oz (74.6 kg) (09/21 0449) Last BM Date: 03/17/16  Weight change: Filed Weights   03/20/16 0630 03/21/16 0450 03/22/16 0449  Weight: 174 lb 9.7 oz (79.2 kg) 172 lb 2.9 oz (78.1 kg) 164 lb 8 oz (74.6 kg)    Intake/Output:   Intake/Output Summary (Last 24 hours) at 03/22/16 0802 Last data filed at 03/22/16 0600  Gross per 24 hour  Intake          1138.77 ml  Output             6510 ml  Net         -5371.23 ml    Physical Exam: CVP 6-7 with prominent v-waves General:  Pale. No resp difficulty. Sitting in the chair HEENT: normal Neck: supple. JVP 7 . Carotids 2+ bilat; no bruits. No lymphadenopathy or thryomegaly appreciated. Cor: PMI nondisplaced. Regular rate & rhythm. No rubs, gallops or murmurs. Lungs: Decreased RLL Abdomen: soft, nontender, nondistended. No hepatosplenomegaly. No bruits or masses. Good bowel sounds. Extremities: no cyanosis, clubbing, rash, RLE 2+ LLE 1+ . L great toe necrotic Neuro: alert & orientedx3, cranial nerves grossly intact. moves all 4 extremities w/o difficulty. Affect pleasant  Telemetry: Sinus Tach 110s  Labs: Basic Metabolic Panel:  Recent Labs Lab 03/17/16 0400  03/18/16 0438 03/18/16 1716  03/19/16 0454 03/20/16 0410 03/21/16 0857 03/22/16 0449  NA 135  < > 130* 131* 132* 131* 134* 131*  K 3.6  < > 4.6 4.6 4.3 3.9 3.8 3.7  CL 99*  < > 97* 95* 98* 93* 93* 89*  CO2 27  --  27  --  23 28  --  30  GLUCOSE 52*  < > 192* 125* 89 104* 100* 183*  BUN 29*  < > 27* 28* 26* 27* 24* 26*  CREATININE 0.93  < > 0.91 1.00 0.92 1.04 1.00 1.22  CALCIUM 8.5*  --  8.2*  --  8.6* 8.5*  --  9.0  < > = values in this interval not displayed.  Liver Function Tests:  Recent Labs Lab 03/16/16 0400 03/17/16 0400 03/19/16 0454 03/20/16 0410 03/22/16 0449  AST 31 25 29 23 19   ALT 20 18 23 22 19   ALKPHOS 97 134* 233* 230* 232*  BILITOT 0.9 0.6 0.8 0.6 1.0  PROT 5.1* 5.2* 5.5* 5.1* 5.8*  ALBUMIN 2.6* 2.4* 2.5* 2.3* 2.8*   No results for input(s): LIPASE, AMYLASE in the last 168 hours. No results for input(s): AMMONIA in the last 168 hours.  CBC:  Recent Labs Lab 03/17/16 0400  03/18/16 [id number] 03/18/16 1716 03/19/16 0454 03/20/16 0410 03/21/16 [id number]  03/22/16 0449  WBC 18.0*  --  13.8*  --  15.6* 11.4*  --  12.3*  HGB 9.3*  < > 8.9* 9.5* 10.1* 8.7* 10.2* 10.4*  HCT 28.6*  < > 27.3* 28.0* 31.0* 26.7* 30.0* 31.7*  MCV 89.4  --  89.5  --  89.3 89.0  --  88.3  PLT 202  --  221  --  266 266  --  386  < > = values in this interval not displayed.  Cardiac Enzymes: No results for input(s): CKTOTAL, CKMB, CKMBINDEX, TROPONINI in the last 168 hours.  BNP: BNP (last 3 results) No results for input(s): BNP in the last 8760 hours.  ProBNP (last 3 results) No results for input(s): PROBNP in the last 8760 hours.    Other results:  Imaging: Dg Chest Port 1 View  Result Date: 03/22/2016 CLINICAL DATA:  CABG 1 week ago EXAM: PORTABLE CHEST 1 VIEW COMPARISON:  03/20/2016 FINDINGS: Upper normal heart size. Left upper extremity PICC is stable. Bibasilar hazy airspace disease has improved. It remains right greater than left. Tiny left pleural effusion unchanged. No pneumothorax. Stable mild  vascular congestion. No sign of interstitial edema. IMPRESSION: Improved bibasilar airspace disease.  Stable tiny left effusion. Electronically Signed   By: Marybelle Killings M.D.   On: 03/22/2016 07:37      Medications:     Scheduled Medications: . amiodarone  200 mg Oral BID  . aspirin EC  325 mg Oral Daily   Or  . aspirin  324 mg Per Tube Daily  . bisacodyl  10 mg Oral Daily  . ceFEPime (MAXIPIME) IV  1 g Intravenous Q12H  . digoxin  0.125 mg Oral Daily  . docusate sodium  200 mg Oral Daily  . enoxaparin (LOVENOX) injection  40 mg Subcutaneous Q24H  . Influenza vac split quadrivalent PF  0.5 mL Intramuscular Once  . insulin aspart  0-15 Units Subcutaneous TID WC  . insulin aspart  0-5 Units Subcutaneous QHS  . insulin detemir  12 Units Subcutaneous BID  . mouth rinse  15 mL Mouth Rinse BID  . metoCLOPramide (REGLAN) injection  10 mg Intravenous Q6H  . mometasone-formoterol  2 puff Inhalation BID  . nicotine  14 mg Transdermal QHS  . pantoprazole  40 mg Oral Daily  . rosuvastatin  40 mg Oral q1800  . sodium chloride flush  10-40 mL Intracatheter Q12H  . spironolactone  25 mg Oral Daily     Infusions: . sodium chloride 250 mL (03/22/16 0006)  . furosemide (LASIX) infusion 7 mg/hr (03/22/16 0000)  . milrinone 0.25 mcg/kg/min (03/22/16 0000)  . norepinephrine (LEVOPHED) Adult infusion 5 mcg/min (03/22/16 0000)     PRN Medications:  ondansetron (ZOFRAN) IV, oxyCODONE, sodium chloride flush, traMADol   Assessment:   1. Inferiro STEMI CAD-->S/P CABG x1 VSD Repair 2. Cardiogenic Shock 3. Ischemic R Great Toe 4. Hyperlipidemia 5. DM Type II 6. PAF 7. Hypoalbumin-2.3    Plan/Discussion:    Post Op Day 9. S/P CABG x1 VSD repair.   Todays CO-OX is 61%. Volume status improved. CVP 4. Brisk diuresis noted. Weight down 8 pounds. Creatinine trending up. Stop lasix drip and start lasix 40 mg po twice daily. Wean norepi. For now continue milrinone 0. 25 mcg. Repeat CO-OX  once norepi off. No bb with low output. Continue digoxin. Continue spiro 25 mg daily. Hold off on arb.   Remains in Sinus Tach- on amio 200 mg twice daily.    Anticipate  additional diuresis after R toe amputation. Continue to follow CVPs.    Length of Stay: [place] NP-C  03/22/2016, 8:02 AM  Advanced Heart Failure Team Pager [phone] (M-F; 7a - 4p)  Please contact [facility] for night-coverage after hours (4p -7a ) and weekends on [email or web address]  Patient seen and examined with Darrick Grinder, NP. We discussed all aspects of the encounter. I agree with the assessment and plan as stated above.   Volume status improving but still with significant volume overload. BP is soft and remains dependent on inotropes. He has prominent v-waves in CVP in RA tracing and I suspect much of this is related to RV failure. Renal function slightly worse so will switch IV diuretics to po torsemide. Will try to wean inotropes as tolerated but suspect it will be very slow course.   He is schedule for toe amputation in am. Ok to proceed.   Loralei Radcliffe,MD 5:14 PM

## 2016-03-22 NOTE — Progress Notes (Signed)
      [address]       [place] [address]             [phone]      9 Days Post-Op Procedure(s) (LRB): CORONARY ARTERY BYPASS GRAFTING (CABG) x 1 (SVG to OM) with EVH from [place] (N/A) VENTRICULAR SEPTAL DEFECT (VSD) REPAIR (N/A) TRANSESOPHAGEAL ECHOCARDIOGRAM (TEE) (N/A)   Subjective:  William [name] states he feels great.   Objective: Vital signs in last 24 hours: Temp:  [98 F (36.7 C)-98.4 F (36.9 C)] 98.1 F (36.7 C) (09/21 0449) Pulse Rate:  [103] 103 (09/20 1112) Cardiac Rhythm: Sinus tachycardia (09/21 0522) Resp:  [15-23] 16 (09/21 0449) BP: (82-120)/(53-75) 93/59 (09/21 0449) SpO2:  [98 %-100 %] 100 % (09/21 0449) FiO2 (%):  [97 %] 97 % (09/20 2209) Weight:  [164 lb 8 oz (74.6 kg)] 164 lb 8 oz (74.6 kg) (09/21 0449)  Hemodynamic parameters for last 24 hours: CVP:  [3 mmHg-11 mmHg] 6 mmHg  Intake/Output from previous day: 09/20 0701 - 09/21 0700 In: 1144.2 [P.O.:600; I.V.:344.2; IV Piggyback:200] Out: 6960 [Urine:6960]  General appearance: alert, cooperative and no distress Heart: regular rate and rhythm Lungs: clear to auscultation bilaterally Abdomen: soft, non-tender; bowel sounds normal; no masses,  no organomegaly Extremities: edema trace Wound: clean and dry  Lab Results:  Recent Labs  03/20/16 0410 03/21/16 0857 03/22/16 0449  WBC 11.4*  --  12.3*  HGB 8.7* 10.2* 10.4*  HCT 26.7* 30.0* 31.7*  PLT 266  --  386   BMET:  Recent Labs  03/20/16 0410 03/21/16 0857 03/22/16 0449  NA 131* 134* 131*  K 3.9 3.8 3.7  CL 93* 93* 89*  CO2 28  --  30  GLUCOSE 104* 100* 183*  BUN 27* 24* 26*  CREATININE 1.04 1.00 1.22  CALCIUM 8.5*  --  9.0    PT/INR: No results for input(s): LABPROT, INR in the last 72 hours. ABG    Component Value Date/Time   PHART 7.427 03/16/2016 0420   HCO3 25.3 03/16/2016 0420   TCO2 28 03/21/2016 0857   ACIDBASEDEF 2.0 03/14/2016 1715   O2SAT 60.6 03/22/2016 0432   CBG  (last 3)   Recent Labs  03/21/16 1146 03/21/16 1543 03/21/16 2144  GLUCAP 155* 129* 152*    Assessment/Plan: S/P Procedure(s) (LRB): CORONARY ARTERY BYPASS GRAFTING (CABG) x 1 (SVG to OM) with EVH from [place] (N/A) VENTRICULAR SEPTAL DEFECT (VSD) REPAIR (N/A) TRANSESOPHAGEAL ECHOCARDIOGRAM (TEE) (N/A)  1. CV- Sinus Tach, inotropes per AHF 2. Pulm- no acute issues, off oxygen, CXR with stable appearance of atelectasis, small left pleural effusion 3. Renal- creatinine mildly elevated at 1.22, on Lasix drip, weight is trending down 4. R Foot gangrene- OR Friday for debridement 5. DM- uncontrolled, preoperatively, sugars ok in hospital, continue current regimen 6. Dispo- patient stable, intropes per AHF, continue current care  LOS: 9 days    William [name] 03/22/2016

## 2016-03-22 NOTE — Progress Notes (Signed)
CARDIAC REHAB PHASE I   PRE:  Rate/Rhythm: 114 ST    BP: sitting 89/58    SaO2: 95 RA  MODE:  Ambulation: 350 ft   POST:  Rate/Rhythm: 123 ST    BP: sitting 90/60     SaO2: 98 RA   Pt eager to walk. Steady with RW despite toe pain. Very slight limp due to walking on side of foot. Increased distance but tired at end of walk. To recliner, HR 123 ST. Encouraged more walking later. [place], ACSM 03/22/2016 1:51 PM

## 2016-03-22 NOTE — Progress Notes (Signed)
Physical Therapy Treatment Patient Details Name: William [name] MRN: [id number] DOB: 11/14/1950 Today's Date: 03/22/2016    History of Present Illness 65 yo s/p acute MI with CABGx1 and septal defect repair. On/off levophed due to hypotension. Planned toe amputation 9/22 PMHx: DM, smoker, HTN, HLD, right great toe gangrene    PT Comments    Limited session due to pain and hypotension. Patient very motivated. Will need to reassess goals and DME needs after surgery 9/22. Please reorder activity and PT (with any weight-bearing restrictions or use of specialty footwear) after his surgery.    Follow Up Recommendations  Home health PT     Equipment Recommendations  None recommended by PT    Recommendations for Other Services       Precautions / Restrictions Precautions Precautions: Sternal Precaution Comments: pt able to state and generalize all precautions  Restrictions Weight Bearing Restrictions: Yes Other Position/Activity Restrictions: sternal precautions    Mobility  Bed Mobility Overal bed mobility: Needs Assistance Bed Mobility: Rolling;Sidelying to Sit Rolling: Modified independent (Device/Increase time) Sidelying to sit: Min assist;HOB elevated       General bed mobility comments: incr effort and time to roll; pushing through Lt elbow to raise torso therefore assisted him  Transfers Overall transfer level: Needs assistance Equipment used: None Transfers: Stand Pivot Transfers Sit to Stand: Min guard Stand pivot transfers: Min guard       General transfer comment: Patient with recent hypotensive event with Levophed started @ 56mcg, therefore close guarding  Ambulation/Gait             General Gait Details: deferred   Stairs            Wheelchair Mobility    Modified Rankin (Stroke Patients Only)       Balance     Sitting balance-Leahy Scale: Good       Standing balance-Leahy Scale: Fair                      Cognition  Arousal/Alertness: Awake/alert Behavior During Therapy: WFL for tasks assessed/performed Overall Cognitive Status: Within Functional Limits for tasks assessed                      Exercises General Exercises - Upper Extremity Elbow Flexion: AROM;Both;20 reps;Seated (to incr BP in sitting) Digit Composite Flexion: AROM;Both;20 reps;Seated (to incr BP in sitting) General Exercises - Lower Extremity Hip Flexion/Marching: AROM;Both;10 reps;Seated (to incr BP in sitting)    General Comments General comments (skin integrity, edema, etc.): RN reports pt's BP dropped into 70s and Levophed restarted. Pt asking to please get OOB due to leg pain. RN agreed OK to try and closely monitor BP      Pertinent Vitals/Pain Orthostatic BPs  Supine 100/64  HR 113  Sitting 86/55           115  Sitting after 3 min & UE ex's 96/65           113  Standing 95/67           115  Standing after  min NA     Pain Assessment: 0-10 Pain Score: 5  Pain Location: RLE Pain Descriptors / Indicators: Cramping Pain Intervention(s): Limited activity within patient's tolerance;Monitored during session;RN gave pain meds during session;Repositioned    Home Living                      Prior Function  PT Goals (current goals can now be found in the care plan section) Acute Rehab PT Goals Patient Stated Goal: return to mowing the yard Time For Goal Achievement: 04/02/16 Progress towards PT goals: Progressing toward goals (TBA after surgery)    Frequency    Min 3X/week      PT Plan Current plan remains appropriate    Co-evaluation             End of Session Equipment Utilized During Treatment: Gait belt Activity Tolerance: Patient tolerated treatment well Patient left: in chair;with call bell/phone within reach     Time: [id number] PT Time Calculation (min) (ACUTE ONLY): 10 min  Charges:  $Therapeutic Activity: 8-22 mins                    G Codes:       William [name] March 30, 2016, 4:54 PM Pager [phone]

## 2016-03-23 ENCOUNTER — Inpatient Hospital Stay (HOSPITAL_COMMUNITY): Payer: Medicare Other | Admitting: Certified Registered Nurse Anesthetist

## 2016-03-23 ENCOUNTER — Encounter (HOSPITAL_COMMUNITY): Disposition: A | Discharge status: Home Health | Payer: Self-pay | Attending: Cardiothoracic Surgery

## 2016-03-23 HISTORY — PX: AMPUTATION: SHX166

## 2016-03-23 LAB — CARBOXYHEMOGLOBIN
Carboxyhemoglobin: 1.7 % — ABNORMAL HIGH (ref 0.5–1.5)
Methemoglobin: 0.9 % (ref 0.0–1.5)
O2 Saturation: 62.2 %
TOTAL HEMOGLOBIN: 9.5 g/dL — AB (ref 12.0–16.0)

## 2016-03-23 LAB — BASIC METABOLIC PANEL
ANION GAP: 10 (ref 5–15)
BUN: 33 mg/dL — ABNORMAL HIGH (ref 6–20)
CO2: 33 mmol/L — ABNORMAL HIGH (ref 22–32)
Calcium: 9.2 mg/dL (ref 8.9–10.3)
Chloride: 90 mmol/L — ABNORMAL LOW (ref 101–111)
Creatinine, Ser: 1.21 mg/dL (ref 0.61–1.24)
GFR calc Af Amer: >60 mL/min (ref >60)
GLUCOSE: 214 mg/dL — AB (ref 65–99)
POTASSIUM: 4 mmol/L (ref 3.5–5.1)
Sodium: 133 mmol/L — ABNORMAL LOW (ref 135–145)

## 2016-03-23 LAB — GLUCOSE, CAPILLARY
GLUCOSE-CAPILLARY: 136 mg/dL — AB (ref 65–99)
GLUCOSE-CAPILLARY: 141 mg/dL — AB (ref 65–99)
GLUCOSE-CAPILLARY: 219 mg/dL — AB (ref 65–99)
Glucose-Capillary: 172 mg/dL — ABNORMAL HIGH (ref 65–99)
Glucose-Capillary: 167 mg/dL — ABNORMAL HIGH (ref 65–99)

## 2016-03-23 LAB — CBC
HEMATOCRIT: 30.8 % — AB (ref 39.0–52.0)
Hemoglobin: 10.2 g/dL — ABNORMAL LOW (ref 13.0–17.0)
MCH: 29.1 pg (ref 26.0–34.0)
MCHC: 33.1 g/dL (ref 30.0–36.0)
MCV: 87.7 fL (ref 78.0–100.0)
PLATELETS: 508 10*3/uL — AB (ref 150–400)
RBC: 3.51 MIL/uL — AB (ref 4.22–5.81)
RDW: 12.9 % (ref 11.5–15.5)
WBC: 14.8 10*3/uL — AB (ref 4.0–10.5)

## 2016-03-23 LAB — MAGNESIUM: Magnesium: 1.7 mg/dL (ref 1.7–2.4)

## 2016-03-23 LAB — DIGOXIN LEVEL: Digoxin Level: 0.5 ng/mL — ABNORMAL LOW (ref 0.8–2.0)

## 2016-03-23 SURGERY — AMPUTATION, FOOT, RAY
Anesthesia: Monitor Anesthesia Care | Site: Foot | Laterality: Right

## 2016-03-23 MED ORDER — CEFAZOLIN SODIUM-DEXTROSE 2-4 GM/100ML-% IV SOLN
INTRAVENOUS | Status: AC
  Filled 2016-03-23: qty 100

## 2016-03-23 MED ORDER — METOCLOPRAMIDE HCL 10 MG PO TABS: 5.0000 mg | ORAL_TABLET | Freq: Three times a day (TID) | ORAL | Status: DC | PRN

## 2016-03-23 MED ORDER — CEFAZOLIN IN D5W 1 GM/50ML IV SOLN: 1.0000 g | Freq: Four times a day (QID) | INTRAVENOUS | Status: DC

## 2016-03-23 MED ORDER — MIDAZOLAM HCL 2 MG/2ML IJ SOLN
INTRAMUSCULAR | Status: AC
  Filled 2016-03-23: qty 2

## 2016-03-23 MED ORDER — ONDANSETRON HCL 4 MG/2ML IJ SOLN
4.0000 mg | Freq: Four times a day (QID) | INTRAMUSCULAR | Status: DC | PRN
Start: 2016-03-23 — End: 2016-03-23

## 2016-03-23 MED ORDER — METHOCARBAMOL 1000 MG/10ML IJ SOLN
500.0000 mg | Freq: Four times a day (QID) | INTRAVENOUS | Status: DC | PRN
  Filled 2016-03-23: qty 5

## 2016-03-23 MED ORDER — LACTATED RINGERS IV SOLN
INTRAVENOUS | Status: DC | PRN
  Administered 2016-03-23: 14:00:00 via INTRAVENOUS

## 2016-03-23 MED ORDER — METHOCARBAMOL 500 MG PO TABS
500.0000 mg | ORAL_TABLET | Freq: Four times a day (QID) | ORAL | Status: DC | PRN
  Administered 2016-03-25: 500 mg via ORAL
  Filled 2016-03-23: qty 1

## 2016-03-23 MED ORDER — CEFAZOLIN SODIUM-DEXTROSE 2-4 GM/100ML-% IV SOLN: 2.0000 g | INTRAVENOUS | Status: DC

## 2016-03-23 MED ORDER — OXYCODONE HCL 5 MG PO TABS: 5.0000 mg | ORAL_TABLET | Freq: Once | ORAL | Status: DC | PRN

## 2016-03-23 MED ORDER — ACETAMINOPHEN 325 MG PO TABS
650.0000 mg | ORAL_TABLET | Freq: Four times a day (QID) | ORAL | Status: DC | PRN
  Administered 2016-03-24: 650 mg via ORAL
  Filled 2016-03-23: qty 2

## 2016-03-23 MED ORDER — 0.9 % SODIUM CHLORIDE (POUR BTL) OPTIME
TOPICAL | Status: DC | PRN
  Administered 2016-03-23: 1000 mL

## 2016-03-23 MED ORDER — PROPOFOL 500 MG/50ML IV EMUL
INTRAVENOUS | Status: DC | PRN
  Administered 2016-03-23: 50 ug/kg/min via INTRAVENOUS

## 2016-03-23 MED ORDER — FENTANYL CITRATE (PF) 100 MCG/2ML IJ SOLN: 25.0000 ug | INTRAMUSCULAR | Status: DC | PRN

## 2016-03-23 MED ORDER — ONDANSETRON HCL 4 MG/2ML IJ SOLN: 4.0000 mg | Freq: Four times a day (QID) | INTRAMUSCULAR | Status: DC | PRN

## 2016-03-23 MED ORDER — FENTANYL CITRATE (PF) 100 MCG/2ML IJ SOLN
INTRAMUSCULAR | Status: DC | PRN
  Administered 2016-03-23: 50 ug via INTRAVENOUS

## 2016-03-23 MED ORDER — CEFAZOLIN SODIUM-DEXTROSE 2-3 GM-% IV SOLR
INTRAVENOUS | Status: DC | PRN
  Administered 2016-03-23: 2 g via INTRAVENOUS

## 2016-03-23 MED ORDER — LACTATED RINGERS IV SOLN: INTRAVENOUS | Status: DC

## 2016-03-23 MED ORDER — MAGNESIUM SULFATE 2 GM/50ML IV SOLN
2.0000 g | Freq: Once | INTRAVENOUS | Status: AC
  Administered 2016-03-23: 2 g via INTRAVENOUS
  Filled 2016-03-23: qty 50

## 2016-03-23 MED ORDER — BUPIVACAINE-EPINEPHRINE (PF) 0.5% -1:200000 IJ SOLN
INTRAMUSCULAR | Status: DC | PRN
  Administered 2016-03-23: 30 mL via PERINEURAL

## 2016-03-23 MED ORDER — FENTANYL CITRATE (PF) 100 MCG/2ML IJ SOLN
INTRAMUSCULAR | Status: AC
  Filled 2016-03-23: qty 2

## 2016-03-23 MED ORDER — ONDANSETRON HCL 4 MG PO TABS: 4.0000 mg | ORAL_TABLET | Freq: Four times a day (QID) | ORAL | Status: DC | PRN

## 2016-03-23 MED ORDER — OXYCODONE HCL 5 MG/5ML PO SOLN: 5.0000 mg | Freq: Once | ORAL | Status: DC | PRN

## 2016-03-23 MED ORDER — MIDAZOLAM HCL 5 MG/5ML IJ SOLN
INTRAMUSCULAR | Status: DC | PRN
  Administered 2016-03-23 (×2): 1 mg via INTRAVENOUS

## 2016-03-23 MED ORDER — ACETAMINOPHEN 650 MG RE SUPP: 650.0000 mg | Freq: Four times a day (QID) | RECTAL | Status: DC | PRN

## 2016-03-23 MED ORDER — SODIUM CHLORIDE 0.9 % IV SOLN: INTRAVENOUS | Status: DC

## 2016-03-23 MED ORDER — MAGNESIUM SULFATE 2 GM/50ML IV SOLN: 2.0000 g | Freq: Once | INTRAVENOUS | Status: DC

## 2016-03-23 MED ORDER — CHLORHEXIDINE GLUCONATE 4 % EX LIQD: 60.0000 mL | Freq: Once | CUTANEOUS | Status: DC

## 2016-03-23 MED ORDER — METOCLOPRAMIDE HCL 5 MG/ML IJ SOLN: 5.0000 mg | Freq: Three times a day (TID) | INTRAMUSCULAR | Status: DC | PRN

## 2016-03-23 SURGICAL SUPPLY — 35 items
APL SKNCLS STERI-STRIP NONHPOA (GAUZE/BANDAGES/DRESSINGS) ×1
BENZOIN TINCTURE PRP APPL 2/3 (GAUZE/BANDAGES/DRESSINGS) ×2 IMPLANT
BLADE SAW SGTL MED 73X18.5 STR (BLADE) ×2 IMPLANT
BLADE SURG 21 STRL SS (BLADE) ×2 IMPLANT
BNDG COHESIVE 4X5 TAN STRL (GAUZE/BANDAGES/DRESSINGS) ×3 IMPLANT
BNDG GAUZE ELAST 4 BULKY (GAUZE/BANDAGES/DRESSINGS) ×3 IMPLANT
COVER SURGICAL LIGHT HANDLE (MISCELLANEOUS) ×4 IMPLANT
DRAPE INCISE IOBAN 66X45 STRL (DRAPES) ×2 IMPLANT
DRAPE U-SHAPE 47X51 STRL (DRAPES) ×6 IMPLANT
DRSG ADAPTIC 3X8 NADH LF (GAUZE/BANDAGES/DRESSINGS) ×3 IMPLANT
DRSG PAD ABDOMINAL 8X10 ST (GAUZE/BANDAGES/DRESSINGS) ×2 IMPLANT
DURAPREP 26ML APPLICATOR (WOUND CARE) ×3 IMPLANT
ELECT REM PT RETURN 9FT ADLT (ELECTROSURGICAL) ×3
ELECTRODE REM PT RTRN 9FT ADLT (ELECTROSURGICAL) ×1 IMPLANT
GAUZE SPONGE 4X4 12PLY STRL (GAUZE/BANDAGES/DRESSINGS) ×1 IMPLANT
GLOVE BIOGEL PI IND STRL 9 (GLOVE) ×1 IMPLANT
GLOVE BIOGEL PI INDICATOR 9 (GLOVE) ×2
GLOVE SURG ORTHO 9.0 STRL STRW (GLOVE) ×3 IMPLANT
GOWN STRL REUS W/ TWL LRG LVL3 (GOWN DISPOSABLE) ×1 IMPLANT
GOWN STRL REUS W/ TWL XL LVL3 (GOWN DISPOSABLE) ×2 IMPLANT
GOWN STRL REUS W/TWL LRG LVL3 (GOWN DISPOSABLE) ×3
GOWN STRL REUS W/TWL XL LVL3 (GOWN DISPOSABLE) ×3
KIT BASIN OR (CUSTOM PROCEDURE TRAY) ×3 IMPLANT
KIT PREVENA INCISION MGT 13 (CANNISTER) ×2 IMPLANT
KIT ROOM TURNOVER OR (KITS) ×3 IMPLANT
NS IRRIG 1000ML POUR BTL (IV SOLUTION) ×3 IMPLANT
PACK ORTHO EXTREMITY (CUSTOM PROCEDURE TRAY) ×3 IMPLANT
PAD ARMBOARD 7.5X6 YLW CONV (MISCELLANEOUS) ×6 IMPLANT
SPONGE LAP 18X18 X RAY DECT (DISPOSABLE) ×3 IMPLANT
STOCKINETTE IMPERVIOUS LG (DRAPES) IMPLANT
SUT ETHILON 2 0 PSLX (SUTURE) ×6 IMPLANT
TOWEL OR 17X24 6PK STRL BLUE (TOWEL DISPOSABLE) ×3 IMPLANT
TOWEL OR 17X26 10 PK STRL BLUE (TOWEL DISPOSABLE) ×1 IMPLANT
UNDERPAD 30X30 (UNDERPADS AND DIAPERS) ×3 IMPLANT
WATER STERILE IRR 1000ML POUR (IV SOLUTION) ×1 IMPLANT

## 2016-03-23 NOTE — Op Note (Signed)
03/13/2016 - 03/23/2016  2:34 PM  PATIENT:  [name]    PRE-OPERATIVE DIAGNOSIS:  Gangrene right great toe extending to the second toe  POST-OPERATIVE DIAGNOSIS:  Same  PROCEDURE:  1st and 2nd Ray Amputation Right Foot Local tissue rearrangement for wound closure 8 x 4 cm. Application of Prevena wound VAC.  SURGEON:  Newt Minion, MD  PHYSICIAN ASSISTANT:None ANESTHESIA:   General  PREOPERATIVE INDICATIONS:  Edwing Miyata is a  65 y.o. male with a diagnosis of Right Great Toe Foot Ulcer who failed conservative measures and elected for surgical management.    The risks benefits and alternatives were discussed with the patient preoperatively including but not limited to the risks of infection, bleeding, nerve injury, cardiopulmonary complications, the need for revision surgery, among others, and the patient was willing to proceed.  OPERATIVE IMPLANTS: Prevena wound VAC.  OPERATIVE FINDINGS: Ischemic muscle in the midfoot.  OPERATIVE PROCEDURE: Patient brought to the operating room after undergoing a regional block. After adequate levels anesthesia obtained patient's right lower extremity was prepped using DuraPrep draped into a sterile field a timeout was called. A racquet incision was made around the first and second rays. The second ray was resected through the midshaft with a oscillating saw the first ray was resected through the base. The necrotic toes were resected in 1 block of tissue. There was necrotic muscle in the midfoot this was also removed with a Ronjair electrocautery was used for hemostasis wound was irrigated with normal saline. Local tissue rearrangement was used to perform wound closure of the wound 8 x 4 cm. A Prevena wound VAC was applied this had a good suction fit patient was taken to the PACU in stable condition.

## 2016-03-23 NOTE — Progress Notes (Signed)
      [address]       [place] [address]             [phone]      Day of Surgery Procedure(s) (LRB): 1st and 2nd Ray Amputation Right Foot (Right) Subjective: Feels good this morning. Ready for his toe debridement.  Objective: Vital signs in last 24 hours: Temp:  [97.7 F (36.5 C)-98.5 F (36.9 C)] 97.9 F (36.6 C) (09/22 0741) Pulse Rate:  [102-111] 111 (09/22 0741) Cardiac Rhythm: Sinus tachycardia (09/22 0700) Resp:  [13-24] 18 (09/22 0741) BP: (70-116)/(40-71) 81/60 (09/22 0741) SpO2:  [98 %-100 %] 100 % (09/22 0741) Weight:  [159 lb 6.3 oz (72.3 kg)] 159 lb 6.3 oz (72.3 kg) (09/22 0500)  Hemodynamic parameters for last 24 hours: CVP:  [5 mmHg-8 mmHg] 6 mmHg  Intake/Output from previous day: 09/21 0701 - 09/22 0700 In: 237.2 [I.V.:237.2] Out: 4100 [Urine:3750; Stool:350] Intake/Output this shift: No intake/output data recorded.  General appearance: alert and cooperative Heart: sinus tachycardia, rate 110s Lungs: clear to auscultation bilaterally Abdomen: soft, non-tender; bowel sounds normal; no masses,  no organomegaly Extremities: no edema, right toe necrosis covered Wound: chest incision c/d/i without drainage  Lab Results:  Recent Labs  03/21/16 0857 03/22/16 0449  WBC  --  12.3*  HGB 10.2* 10.4*  HCT 30.0* 31.7*  PLT  --  386   BMET:  Recent Labs  03/21/16 0857 03/22/16 0449  NA 134* 131*  K 3.8 3.7  CL 93* 89*  CO2  --  30  GLUCOSE 100* 183*  BUN 24* 26*  CREATININE 1.00 1.22  CALCIUM  --  9.0    PT/INR: No results for input(s): LABPROT, INR in the last 72 hours. ABG    Component Value Date/Time   PHART 7.427 03/16/2016 0420   HCO3 25.3 03/16/2016 0420   TCO2 28 03/21/2016 0857   ACIDBASEDEF 2.0 03/14/2016 1715   O2SAT 62.2 03/23/2016 0440   CBG (last 3)   Recent Labs  03/22/16 1605 03/22/16 2221 03/23/16 0739  GLUCAP 216* 365* 172*    Assessment/Plan: S/P Procedure(s) (LRB): 1st and 2nd Ray  Amputation Right Foot (Right)  1. CV- Sinus Tach, inotropes per AHF, milrinone and Norepi. Tolerating digoxin and amio. d/c EPW 2. Pulm- no acute issues, off oxygen, CXR with stable appearance of atelectasis, small left pleural effusion. Continue Dulera inhaler. On RA 3. Renal- creatinine mildly elevated at 1.22 yest, Lasix drip discontinued, weight is trending down 4. R Foot gangrene- OR today for debridement 5. DM- uncontrolled, preoperatively, up to blood glucose level of 365 last night-better controlled this morning, continue current regimen 6. Dispo- patient stable, intropes per AHF, continue current care   LOS: 10 days    Elgie Collard 03/23/2016

## 2016-03-23 NOTE — Progress Notes (Signed)
03/23/2016 4:36 PM EPW d/c per order and per protocol. Ends intact. Pt. Tolerated well. Advised BR x 1 HR. Call bell within reach. VS collected per protocol. Will continue to monitor patient.  William [name], Arville Lime

## 2016-03-23 NOTE — Telephone Encounter (Addendum)
-----   Message from Gean Birchwood, DPM sent at 03/13/2016  8:25 AM EDT ----- Contact patient and confirm a follow-up appointment for the arterial Doppler dated 03/11/2016  50-74% right mid SFA long focal stenosis, (75-99% by ratio criteria). Occluded right anterior tibial artery. Two vessel run-off, on the right.  Appointment with Dr. Fletcher Anon on 03/13/16.  See Arterial Doppler report. 03/23/2016-Pt's dtr, Con Memos states pt had massive heart attack and had arteries opened surgically, and is going to surgery today for amputation of toes from his foot.  I told Ms Marijean Bravo I would tell Dr. Amalia Hailey and I was very sorry for pt's illnesses.

## 2016-03-23 NOTE — Interval H&P Note (Signed)
History and Physical Interval Note:  03/23/2016 7:18 AM  [name]  has presented today for surgery, with the diagnosis of Right Great Toe Foot Ulcer  The various methods of treatment have been discussed with the patient and family. After consideration of risks, benefits and other options for treatment, the patient has consented to  Procedure(s): 1st and 2nd Ray Amputation Right Foot (Right) as a surgical intervention .  The patient's history has been reviewed, patient examined, no change in status, stable for surgery.  I have reviewed the patient's chart and labs.  Questions were answered to the patient's satisfaction.     Newt Minion

## 2016-03-23 NOTE — Transfer of Care (Signed)
Immediate Anesthesia Transfer of Care Note  Patient: William [name]  Procedure(s) Performed: Procedure(s): 1st and 2nd Ray Amputation Right Foot (Right)  Patient Location: PACU  Anesthesia Type:MAC combined with regional for post-op pain  Level of Consciousness: awake, alert  and oriented  Airway & Oxygen Therapy: Patient Spontanous Breathing and Patient connected to nasal cannula oxygen  Post-op Assessment: Report given to RN and Post -op Vital signs reviewed and stable  Post vital signs: Reviewed and stable  Last Vitals:  Vitals:   03/23/16 1200 03/23/16 1449  BP: 96/63   Pulse: (!) 108   Resp: 17   Temp:  36.6 C    Last Pain:  Vitals:   03/23/16 1143  TempSrc: Oral  PainSc:       Patients Stated Pain Goal: 1 ([id number])  Complications: No apparent anesthesia complications

## 2016-03-23 NOTE — Progress Notes (Signed)
Dr. Lorin Mercy notified of right foot wound vac dressing bloody, no drainage noted in tubing.  Orders received to wrap right foot wound vac dressing with ACE wrap.  Ortho tech paged.  Will continue to monitor pt closely.

## 2016-03-23 NOTE — Progress Notes (Signed)
Inpatient Diabetes Program Recommendations  AACE/ADA: New Consensus Statement on Inpatient Glycemic Control (2015)  Target Ranges:  Prepandial:   less than 140 mg/dL      Peak postprandial:   less than 180 mg/dL (1-2 hours)      Critically ill patients:  140 - 180 mg/dL   Lab Results  Component Value Date   GLUCAP 172 (H) 03/23/2016   HGBA1C 10.7 (H) 03/13/2016    Review of Glycemic Control  Results for William [name], William [name] (MRN [id number]) as of 03/23/2016 11:10  Ref. Range 03/22/2016 08:25 03/22/2016 12:15 03/22/2016 16:05 03/22/2016 22:21 03/23/2016 07:39  Glucose-Capillary Latest Ref Range: 65 - 99 mg/dL 128 (H) 158 (H) 216 (H) 365 (H) 172 (H)    Current orders for Inpatient glycemic control: Levemir 12 units BID, Novolog 0-15 units TID with meals, Novolog 0-5 units QHS  Inpatient Diabetes Program Recommendations:  Elevated CBG last night??? food intake - fasting blood sugar 172mg /dl this am and otherwise fairly well controlled on current medications.  Agree with current orders for blood sugar management  Gentry Fitz, RN, [place], [place], CDE Diabetes Coordinator Inpatient Diabetes Program  [phone] (Team Pager) [phone] ([place]) 03/23/2016 2:06 PM

## 2016-03-23 NOTE — Progress Notes (Signed)
Pt returned from PACU, home wound vac in place.  Site clean, dry, dressing intact with suction noted. No drainage of clots noted.  Per wound vac, there is a leak detected.  Will continue to monitor pt closely.

## 2016-03-23 NOTE — Progress Notes (Signed)
Chest tube sutures removed. Sites clean and dry. Pt tolerated procedure without incident.

## 2016-03-23 NOTE — Progress Notes (Signed)
Orthopedic Tech Progress Note Patient Details:  William [name] 07/13/1950 [id number]  Ortho Devices Type of Ortho Device: Darco shoe Ortho Device/Splint Location: RLE Ortho Device/Splint Interventions: Ordered, Application   Braulio Bosch 03/23/2016, 6:39 PM

## 2016-03-23 NOTE — H&P (View-Only) (Signed)
ORTHOPAEDIC CONSULTATION  REQUESTING PHYSICIAN: Ivin Poot, MD  Chief Complaint: Gangrenous right great toe with cellulitis right forefoot  HPI: William [name] is a 65 y.o. male who presents with gangrenous right great toe. Patient has been treated by podiatry and patient states that the great toe ulceration has progressively gotten worse. Patient states he had his heart attack after his toe started ulcerating. Patient is status post coronary artery bypass surgery.  Past Medical History:  Diagnosis Date  . Diabetes mellitus without complication ([place])   . Hyperlipidemia   . Hypertension    Past Surgical History:  Procedure Laterality Date  . CARDIAC CATHETERIZATION N/A 03/13/2016   Procedure: Right/Left Heart Cath and Coronary Angiography;  Surgeon: Sherren Mocha, MD;  Location: [facility];  Service: Cardiovascular;  Laterality: N/A;  . CARDIAC CATHETERIZATION N/A 03/13/2016   Procedure: IABP Insertion;  Surgeon: Sherren Mocha, MD;  Location: [facility];  Service: Cardiovascular;  Laterality: N/A;  . CORONARY ARTERY BYPASS GRAFT N/A 03/13/2016   Procedure: CORONARY ARTERY BYPASS GRAFTING (CABG) x 1 (SVG to OM) with EVH from [place];  Surgeon: Ivin Poot, MD;  Location: [name];  Service: Open Heart Surgery;  Laterality: N/A;  . TEE WITHOUT CARDIOVERSION N/A 03/13/2016   Procedure: TRANSESOPHAGEAL ECHOCARDIOGRAM (TEE);  Surgeon: Ivin Poot, MD;  Location: [facility];  Service: Open Heart Surgery;  Laterality: N/A;  . VSD REPAIR N/A 03/13/2016   Procedure: VENTRICULAR SEPTAL DEFECT (VSD) REPAIR;  Surgeon: Ivin Poot, MD;  Location: [facility];  Service: Open Heart Surgery;  Laterality: N/A;   Social History   Social History  . Marital status: Married    Spouse name: Lavell Luster  . Number of children: N/A  . Years of education: N/A   Social History Main Topics  . Smoking status: Current Every Day Smoker    Packs/day: 0.50    Types: Cigarettes    . Smokeless tobacco: Never Used  . Alcohol use No  . Drug use: No  . Sexual activity: Not Asked   Other Topics Concern  . None   Social History Narrative  . None   History reviewed. No pertinent family history. - negative except otherwise stated in the family history section Allergies  Allergen Reactions  . Morphine And Related Other (See Comments)    Pt said it was too much    Prior to Admission medications   Medication Sig Start Date End Date Taking? Authorizing Provider  atorvastatin (LIPITOR) 20 MG tablet Take 20 mg by mouth at bedtime.   Yes Historical Provider, MD  canagliflozin (INVOKANA) 300 MG TABS tablet Take 300 mg by mouth daily before breakfast.   Yes Historical Provider, MD  ibuprofen (ADVIL,MOTRIN) 200 MG tablet Take 200 mg by mouth every 6 (six) hours as needed. Take 1-2 pills for pain    Yes Historical Provider, MD  insulin detemir (LEVEMIR) 100 UNIT/ML injection Inject 40-50 Units into the skin 2 (two) times daily. Take 50 every morning  Take 40 units every evening   Yes Historical Provider, MD  lisinopril (PRINIVIL,ZESTRIL) 10 MG tablet Take 1 tablet (10 mg total) by mouth daily. 07/06/13  Yes Gerda Diss, DO  meloxicam (MOBIC) 7.5 MG tablet Take 7.5 mg by mouth 2 (two) times daily.   Yes Historical Provider, MD  metFORMIN (GLUCOPHAGE) 1000 MG tablet Take 1 tablet (1,000 mg total) by mouth 2 (two) times daily with a meal. 07/06/13  Yes Juanda Bond  Paulla Fore, DO  pantoprazole (PROTONIX) 40 MG tablet Take 40 mg by mouth daily.   Yes Historical Provider, MD  pramipexole (MIRAPEX) 0.125 MG tablet Take 0.125 mg by mouth at bedtime.   Yes Historical Provider, MD  omeprazole (PRILOSEC) 40 MG capsule Take 1 capsule (40 mg total) by mouth daily. 01/21/12 01/20/13  Gerda Diss, DO   Dg Chest Port 1 View  Result Date: 03/20/2016 CLINICAL DATA:  Status post coronary bypass grafting, chest pain EXAM: PORTABLE CHEST 1 VIEW COMPARISON:  03/19/2016 FINDINGS: Cardiac shadow remains  enlarged. A left PICC line is again noted in satisfactory position. Stable vascular congestion is noted with right-sided pleural effusion and right basilar atelectasis. No new focal abnormality is seen. IMPRESSION: No change from the previous exam. Electronically Signed   By: Inez Catalina M.D.   On: 03/20/2016 07:43   Dg Foot Complete Right  Result Date: 03/19/2016 CLINICAL DATA:  Diabetic ulcer bottom of right great toe EXAM: RIGHT FOOT COMPLETE - 3+ VIEW COMPARISON:  02/29/2016 FINDINGS: No radiographic changes of osteomyelitis. Soft tissue swelling within the right great toe. No fracture, subluxation or dislocation. IMPRESSION: No acute bony abnormality. No radiographic changes of osteomyelitis. Electronically Signed   By: Rolm Baptise M.D.   On: 03/19/2016 10:31   - pertinent xrays, CT, MRI studies were reviewed and independently interpreted  Positive ROS: All other systems have been reviewed and were otherwise negative with the exception of those mentioned in the HPI and as above.  Physical Exam: General: Alert, no acute distress Psychiatric: Patient is competent for consent with normal mood and affect Lymphatic: No axillary or cervical lymphadenopathy Cardiovascular: No pedal edema Respiratory: No cyanosis, no use of accessory musculature GI: No organomegaly, abdomen is soft and non-tender  Skin: Patient has cellulitis to the midfoot of the right foot. Patient has gangrenous changes of the great toe which extends to the base of the second toe.   Neurologic: Patient does not have protective sensation bilateral lower extremities.   MUSCULOSKELETAL:  Patient does not have a palpable pulse but by report she does have an ankle-brachial indices of 0.8. Radiographs do not show any chronic osteomyelitis.  Assessment: Assessment: Gangrene right great toe with cellulitis right forefoot  Plan: Plan: We'll plan for a first and second Ray amputation on Friday. Discussed with the patient that  he may need further surgery but we will proceed with a minimal amount of surgery at this time to try and resolve his infection.  Thank you for the consult and the opportunity to see William [name], [place] [phone] 7:27 AM

## 2016-03-23 NOTE — Anesthesia Preprocedure Evaluation (Signed)
Anesthesia Evaluation  Patient identified by MRN, date of birth, ID band Patient awake    Reviewed: Allergy & Precautions, NPO status , Patient's Chart, lab work & pertinent test results  Airway Mallampati: II   Neck ROM: full    Dental   Pulmonary Current Smoker,    breath sounds clear to auscultation       Cardiovascular hypertension, + CAD, + Past MI, + CABG and +CHF   Rhythm:regular Rate:Normal  S/p CABG 9 days ago.  Post op fluid overload and CHF.  Currently on milrinone and levophed.   Neuro/Psych    GI/Hepatic GERD  ,  Endo/Other  diabetes, Type 2  Renal/GU      Musculoskeletal   Abdominal   Peds  Hematology   Anesthesia Other Findings   Reproductive/Obstetrics                             Anesthesia Physical Anesthesia Plan  ASA: III  Anesthesia Plan: MAC and Regional   Post-op Pain Management:    Induction: Intravenous  Airway Management Planned: Simple Face Mask  Additional Equipment:   Intra-op Plan:   Post-operative Plan:   Informed Consent: I have reviewed the patients History and Physical, chart, labs and discussed the procedure including the risks, benefits and alternatives for the proposed anesthesia with the patient or authorized representative who has indicated his/her understanding and acceptance.     Plan Discussed with: CRNA, Anesthesiologist and Surgeon  Anesthesia Plan Comments:         Anesthesia Quick Evaluation

## 2016-03-23 NOTE — Progress Notes (Signed)
Advanced Heart Failure Rounding Note   Subjective:    Asked by Dr Prescott Gum to follow for cardiogenic shock and marked volume overload. He is post op day 9 S/P CABG x1 and VSD repair.   Yesterday we were unable to wean norepi. Remains on milrinone 0.25 mcg + norepi 5 mcg. . Brisk diuresis noted. Weight down 5 pounds. Todays CO-OX 62%.   Complaining of R foot pain. Denies SOB.     Objective:   Weight Range:  Vital Signs:   Temp:  [97.7 F (36.5 C)-98.5 F (36.9 C)] 97.9 F (36.6 C) (09/22 0741) Pulse Rate:  [102-111] 111 (09/22 0741) Resp:  [13-24] 18 (09/22 0741) BP: (70-116)/(40-71) 81/60 (09/22 0741) SpO2:  [98 %-100 %] 100 % (09/22 0741) Weight:  [159 lb 6.3 oz (72.3 kg)] 159 lb 6.3 oz (72.3 kg) (09/22 0500) Last BM Date: 03/17/16  Weight change: Filed Weights   03/21/16 0450 03/22/16 0449 03/23/16 0500  Weight: 172 lb 2.9 oz (78.1 kg) 164 lb 8 oz (74.6 kg) 159 lb 6.3 oz (72.3 kg)    Intake/Output:   Intake/Output Summary (Last 24 hours) at 03/23/16 0859 Last data filed at 03/23/16 0640  Gross per 24 hour  Intake           220.01 ml  Output             4100 ml  Net         -3879.99 ml    Physical Exam: CVP 7-8 with prominent v-waves General:  Pale. No resp difficulty. Sitting in the chair HEENT: normal Neck: supple. JVP 7 . Carotids 2+ bilat; no bruits. No lymphadenopathy or thryomegaly appreciated. Cor: PMI nondisplaced. Regular rate & rhythm. No rubs, gallops or murmurs. Lungs: Decreased RLL Abdomen: soft, nontender, nondistended. No hepatosplenomegaly. No bruits or masses. Good bowel sounds. Extremities: no cyanosis, clubbing, rash, RLE 2+ LLE 1+ . L great toe necrotic Neuro: alert & orientedx3, cranial nerves grossly intact. moves all 4 extremities w/o difficulty. Affect pleasant  Telemetry: Sinus Tach 110s  Labs: Basic Metabolic Panel:  Recent Labs Lab 03/17/16 0400  03/18/16 0438 03/18/16 1716 03/19/16 0454 03/20/16 0410  03/21/16 0857 03/22/16 0449  NA 135  < > 130* 131* 132* 131* 134* 131*  K 3.6  < > 4.6 4.6 4.3 3.9 3.8 3.7  CL 99*  < > 97* 95* 98* 93* 93* 89*  CO2 27  --  27  --  23 28  --  30  GLUCOSE 52*  < > 192* 125* 89 104* 100* 183*  BUN 29*  < > 27* 28* 26* 27* 24* 26*  CREATININE 0.93  < > 0.91 1.00 0.92 1.04 1.00 1.22  CALCIUM 8.5*  --  8.2*  --  8.6* 8.5*  --  9.0  < > = values in this interval not displayed.  Liver Function Tests:  Recent Labs Lab 03/17/16 0400 03/19/16 0454 03/20/16 0410 03/22/16 0449  AST 25 29 23 19   ALT 18 23 22 19   ALKPHOS 134* 233* 230* 232*  BILITOT 0.6 0.8 0.6 1.0  PROT 5.2* 5.5* 5.1* 5.8*  ALBUMIN 2.4* 2.5* 2.3* 2.8*   No results for input(s): LIPASE, AMYLASE in the last 168 hours. No results for input(s): AMMONIA in the last 168 hours.  CBC:  Recent Labs Lab 03/17/16 0400  03/18/16 0438 03/18/16 1716 03/19/16 0454 03/20/16 0410 03/21/16 0857 03/22/16 0449  WBC 18.0*  --  13.8*  --  15.6* 11.4*  --  12.3*  HGB 9.3*  < > 8.9* 9.5* 10.1* 8.7* 10.2* 10.4*  HCT 28.6*  < > 27.3* 28.0* 31.0* 26.7* 30.0* 31.7*  MCV 89.4  --  89.5  --  89.3 89.0  --  88.3  PLT 202  --  221  --  266 266  --  386  < > = values in this interval not displayed.  Cardiac Enzymes: No results for input(s): CKTOTAL, CKMB, CKMBINDEX, TROPONINI in the last 168 hours.  BNP: BNP (last 3 results) No results for input(s): BNP in the last 8760 hours.  ProBNP (last 3 results) No results for input(s): PROBNP in the last 8760 hours.    Other results:  Imaging: Dg Chest Port 1 View  Result Date: 03/22/2016 CLINICAL DATA:  CABG 1 week ago EXAM: PORTABLE CHEST 1 VIEW COMPARISON:  03/20/2016 FINDINGS: Upper normal heart size. Left upper extremity PICC is stable. Bibasilar hazy airspace disease has improved. It remains right greater than left. Tiny left pleural effusion unchanged. No pneumothorax. Stable mild vascular congestion. No sign of interstitial edema. IMPRESSION:  Improved bibasilar airspace disease.  Stable tiny left effusion. Electronically Signed   By: Marybelle Killings M.D.   On: 03/22/2016 07:37     Medications:     Scheduled Medications: . amiodarone  200 mg Oral BID  . aspirin EC  325 mg Oral Daily   Or  . aspirin  324 mg Per Tube Daily  . bisacodyl  10 mg Oral Daily  . ceFEPime (MAXIPIME) IV  1 g Intravenous Q12H  . digoxin  0.125 mg Oral Daily  . docusate sodium  200 mg Oral Daily  . enoxaparin (LOVENOX) injection  40 mg Subcutaneous Q24H  . Influenza vac split quadrivalent PF  0.5 mL Intramuscular Once  . insulin aspart  0-15 Units Subcutaneous TID WC  . insulin aspart  0-5 Units Subcutaneous QHS  . insulin detemir  12 Units Subcutaneous BID  . mouth rinse  15 mL Mouth Rinse BID  . metoCLOPramide (REGLAN) injection  10 mg Intravenous Q6H  . mometasone-formoterol  2 puff Inhalation BID  . nicotine  14 mg Transdermal QHS  . pantoprazole  40 mg Oral Daily  . rosuvastatin  40 mg Oral q1800  . sodium chloride flush  10-40 mL Intracatheter Q12H  . spironolactone  25 mg Oral Daily  . torsemide  20 mg Oral BID    Infusions: . sodium chloride 250 mL (03/22/16 0006)  . milrinone 0.25 mcg/kg/min (03/23/16 0640)  . norepinephrine (LEVOPHED) Adult infusion 5 mcg/min (03/23/16 0000)    PRN Medications: ondansetron (ZOFRAN) IV, oxyCODONE, sodium chloride flush, traMADol   Assessment:   1. Inferiro STEMI CAD-->S/P CABG x1 VSD Repair 2. Cardiogenic Shock 3. Ischemic R Great Toe 4. Hyperlipidemia 5. DM Type II 6. PAF 7. Hypoalbumin-2.3    Plan/Discussion:    Post Op Day 10. S/P CABG x1 VSD repair.   Todays CO-OX is 62%. For now contiinue milrione 0.25 mcg + Norepi 5 mcg. Volume status improved. CVP 7-8. [name]  BMET pending.    Continue digoxin. Continue spiro 25 mg daily. Hold off on arb.   Remains in Sinus Tach- on amio 200 mg twice daily.    Anticipate additional diuresis after R toe amputation. Continue to follow CVPs.     Length of Stay: [place] NP-C  03/23/2016, 8:59 AM  Advanced Heart Failure Team Pager [phone] (M-F; 7a - 4p)  Please contact [facility] for night-coverage after  hours (4p -7a ) and weekends on [email or web address]  Patient seen and examined with Darrick Grinder, NP. We discussed all aspects of the encounter. I agree with the assessment and plan as stated above.   Co-ox and CVP improving but still with some edema. Continue inotropes for OR today and then will begin wean tomorrow. Renal function stable. Continue amio for now.   Mikail Goostree,MD 2:13 PM

## 2016-03-23 NOTE — Anesthesia Procedure Notes (Signed)
Anesthesia Regional Block:  Popliteal block  Pre-Anesthetic Checklist: ,, timeout performed, Correct Patient, Correct Site, Correct Laterality, Correct Procedure, Correct Position, site marked, Risks and benefits discussed,  Surgical consent,  Pre-op evaluation,  At surgeon's request and post-op pain management  Laterality: Right  Prep: chloraprep       Needles:  Injection technique: Single-shot  Needle Type: Echogenic Stimulator Needle     Needle Length:cm 9 cm Needle Gauge: 21 G    Additional Needles:  Procedures: ultrasound guided (picture in chart) and nerve stimulator Popliteal block  Nerve Stimulator or Paresthesia:  Response: plantar flexion of foot, 0.45 mA,   Additional Responses:   Narrative:  Start time: 03/23/2016 1:01 PM End time: 03/23/2016 1:12 PM Injection made incrementally with aspirations every 5 mL.  Performed by: Personally  Anesthesiologist: Kathryn Cosby  Additional Notes: Functioning IV was confirmed and monitors were applied.  A 68mm 21ga Arrow echogenic stimulator needle was used. Sterile prep and drape,hand hygiene and sterile gloves were used.  Negative aspiration and negative test dose prior to incremental administration of local anesthetic. The patient tolerated the procedure well.  Ultrasound guidance: relevent anatomy identified, needle position confirmed, local anesthetic spread visualized around nerve(s), vascular puncture avoided.  Image printed for medical record.

## 2016-03-24 DIAGNOSIS — I25110 Atherosclerotic heart disease of native coronary artery with unstable angina pectoris: Secondary | ICD-10-CM

## 2016-03-24 LAB — CARBOXYHEMOGLOBIN
CARBOXYHEMOGLOBIN: 1.4 % (ref 0.5–1.5)
Methemoglobin: 1 % (ref 0.0–1.5)
O2 Saturation: 55.3 %
Total hemoglobin: 10.6 g/dL — ABNORMAL LOW (ref 12.0–16.0)

## 2016-03-24 LAB — BASIC METABOLIC PANEL
Anion gap: 14 (ref 5–15)
BUN: 32 mg/dL — AB (ref 6–20)
CHLORIDE: 86 mmol/L — AB (ref 101–111)
CO2: 35 mmol/L — AB (ref 22–32)
CREATININE: 1.34 mg/dL — AB (ref 0.61–1.24)
Calcium: 9.7 mg/dL (ref 8.9–10.3)
GFR calc Af Amer: >60 mL/min (ref >60)
GFR calc non Af Amer: 54 mL/min — ABNORMAL LOW (ref >60)
GLUCOSE: 194 mg/dL — AB (ref 65–99)
POTASSIUM: 4 mmol/L (ref 3.5–5.1)
SODIUM: 135 mmol/L (ref 135–145)

## 2016-03-24 LAB — CBC
HCT: 30.8 % — ABNORMAL LOW (ref 39.0–52.0)
HEMOGLOBIN: 10 g/dL — AB (ref 13.0–17.0)
MCH: 28.8 pg (ref 26.0–34.0)
MCHC: 32.5 g/dL (ref 30.0–36.0)
MCV: 88.8 fL (ref 78.0–100.0)
Platelets: 469 10*3/uL — ABNORMAL HIGH (ref 150–400)
RBC: 3.47 MIL/uL — AB (ref 4.22–5.81)
RDW: 13.2 % (ref 11.5–15.5)
WBC: 16.9 10*3/uL — ABNORMAL HIGH (ref 4.0–10.5)

## 2016-03-24 LAB — GLUCOSE, CAPILLARY
GLUCOSE-CAPILLARY: 162 mg/dL — AB (ref 65–99)
GLUCOSE-CAPILLARY: 309 mg/dL — AB (ref 65–99)
Glucose-Capillary: 222 mg/dL — ABNORMAL HIGH (ref 65–99)

## 2016-03-24 MED ORDER — SORBITOL 70 % PO SOLN
30.0000 mL | Freq: Once | ORAL | Status: AC
  Administered 2016-03-24: 30 mL via ORAL
  Filled 2016-03-24: qty 30

## 2016-03-24 NOTE — Progress Notes (Addendum)
      [address]       [place] [address]             [phone]      1 Day Post-Op Procedure(s) (LRB): 1st and 2nd Ray Amputation Right Foot (Right)   Subjective:  States feels pretty good.  + Cough but nothing coming up.  Pain controlled from toe amputations yesterday.  Objective: Vital signs in last 24 hours: Temp:  [97.5 F (36.4 C)-98.3 F (36.8 C)] 97.5 F (36.4 C) (09/23 0800) Pulse Rate:  [108-116] 112 (09/23 0600) Cardiac Rhythm: Sinus tachycardia (09/23 0800) Resp:  [8-26] 16 (09/23 0600) BP: (83-116)/(55-86) 92/58 (09/23 0600) SpO2:  [94 %-100 %] 98 % (09/23 0830) Weight:  [152 lb (68.9 kg)] 152 lb (68.9 kg) (09/23 0700)  Hemodynamic parameters for last 24 hours: CVP:  [3 mmHg-10 mmHg] 8 mmHg  Intake/Output from previous day: 09/22 0701 - 09/23 0700 In: 886.7 [P.O.:30; I.V.:706.7; IV Piggyback:150] Out: 4690 [Urine:4680; Blood:10] Intake/Output this shift: Total I/O In: 114.3 [I.V.:64.3; IV Piggyback:50] Out: -   General appearance: alert, cooperative and no distress Heart: regular rate and rhythm and tachy Lungs: clear to auscultation bilaterally Abdomen: soft, non-tender; bowel sounds normal; no masses,  no organomegaly Wound: clean and dry, dressing on right foot  Lab Results:  Recent Labs  03/23/16 0950 03/24/16 0448  WBC 14.8* 16.9*  HGB 10.2* 10.0*  HCT 30.8* 30.8*  PLT 508* 469*   BMET:  Recent Labs  03/23/16 0950 03/24/16 0448  NA 133* 135  K 4.0 4.0  CL 90* 86*  CO2 33* 35*  GLUCOSE 214* 194*  BUN 33* 32*  CREATININE 1.21 1.34*  CALCIUM 9.2 9.7    PT/INR: No results for input(s): LABPROT, INR in the last 72 hours. ABG    Component Value Date/Time   PHART 7.427 03/16/2016 0420   HCO3 25.3 03/16/2016 0420   TCO2 28 03/21/2016 0857   ACIDBASEDEF 2.0 03/14/2016 1715   O2SAT 55.3 03/24/2016 0455   CBG (last 3)   Recent Labs  03/23/16 1502 03/23/16 1618 03/23/16 2109  GLUCAP 136* 141* 219*     Assessment/Plan: S/P Procedure(s) (LRB): 1st and 2nd Ray Amputation Right Foot (Right)   1. CV- Sinus Tach, BP a little low this morning-AHF weaning milrinone, levophed as tolerated, on Amiodarone, and Digoxin 2. Pulm- wean oxygen as tolerated, continue IS 3. Renal- creatinine mildly elevated at 1.34, good response to diuretics- continue Aldactone, Demedex per HF recs 4. Right Foot Gangrene- Debridement done yesterday 5. DM- sugars better controlled, continue regimen 6. Dispo- patient stable, drips per HF, continue current care   LOS: 11 days    BARRETT, ERIN 03/24/2016   Chart reviewed, patient examined, agree with above. Co-ox 55% on milrinone 0.25 and levophed 5. It was 62% yesterday am. He has been diuresing very well. CVP is 8. BP is borderline and resting tachycardia. Creat up a little. He may be getting too dry.

## 2016-03-24 NOTE — Progress Notes (Signed)
Physical Therapy Treatment Patient Details Name: William [name] MRN: [id number] DOB: 10-01-1950 Today's Date: 03/24/2016    History of Present Illness 65 yo s/p acute MI with CABGx1 and septal defect repair. On/off levophed due to hypotension. Underwent rt 1st and 2nd toe amputation 9/22 by Dr. Sharol Given. PMHx: DM, smoker, HTN, HLD, right great toe gangrene    PT Comments    Pt now with rt toe amputations with restrictions for touch down wt bearing on RLE. With sternal precautions and RLE as touchdown weight bearing pt will not be able to adhere to both of these and amb functional distance. In order to amb pt would have to have one of these restrictions liberalized. Pt has needed equipment at home but if not ambulating will experience significant deconditioning.  Follow Up Recommendations  Home health PT;Supervision for mobility/OOB     Equipment Recommendations  None recommended by PT    Recommendations for Other Services       Precautions / Restrictions Precautions Precautions: Sternal Precaution Comments: pt able to state and generalize all precautions  Required Braces or Orthoses: Other Brace/Splint Other Brace/Splint: darco shoe rt foot Restrictions Weight Bearing Restrictions: Yes RLE Weight Bearing: Touchdown weight bearing Other Position/Activity Restrictions: sternal precautions    Mobility  Bed Mobility Overal bed mobility: Needs Assistance Bed Mobility: Rolling;Sidelying to Sit Rolling: Modified independent (Device/Increase time) Sidelying to sit: Min assist;HOB elevated       General bed mobility comments: assist to bring trunk into sitting  Transfers Overall transfer level: Needs assistance Equipment used: Rolling walker (2 wheeled) Transfers: Sit to/from Stand Sit to Stand: Min assist Stand pivot transfers: Min assist       General transfer comment: Assist for balance. Pt trying to minimize wt bearing on RLE and maintain sternal  precautions.  Ambulation/Gait             General Gait Details: deferred due to difficulty with having both sternal precuations and limited wt bearing on RLE   Stairs            Wheelchair Mobility    Modified Rankin (Stroke Patients Only)       Balance Overall balance assessment: Needs assistance Sitting-balance support: No upper extremity supported;Feet supported Sitting balance-Leahy Scale: Good     Standing balance support: Bilateral upper extremity supported Standing balance-Leahy Scale: Fair Standing balance comment: walker and min A                    Cognition Arousal/Alertness: Awake/alert Behavior During Therapy: WFL for tasks assessed/performed Overall Cognitive Status: Within Functional Limits for tasks assessed                      Exercises      General Comments        Pertinent Vitals/Pain Pain Assessment: No/denies pain    Home Living                      Prior Function            PT Goals (current goals can now be found in the care plan section) Acute Rehab PT Goals PT Goal Formulation: With patient Time For Goal Achievement: 03/31/16 Potential to Achieve Goals: Fair Progress towards PT goals: Goals downgraded-see care plan;Goals met/education completed, patient discharged from PT (Due to toe amputaions and wt bearing)    Frequency    Min 3X/week      PT Plan Current plan remains  appropriate    Co-evaluation             End of Session Equipment Utilized During Treatment: Gait belt Activity Tolerance: Patient tolerated treatment well Patient left: in chair;with call bell/phone within reach     Time: 3475-8307 PT Time Calculation (min) (ACUTE ONLY): 16 min  Charges:                       G Codes:      Lynia Landry 04/20/2016, 4:38 PM [facility] PT [phone]

## 2016-03-24 NOTE — Progress Notes (Signed)
Advanced Heart Failure Rounding Note   Subjective:    Asked by Dr Prescott Gum to follow for cardiogenic shock and marked volume overload. He is post op day 9 S/P CABG x1 and VSD repair.   Remains on milrinone 0.25 mcg + norepi 5 mcg. .   Brisk diuresis noted on po torsemdie. Weight down another 7 pounds. Todays CO-OX 62%-> 55%. CVP 8 Creatinine up again slightly 1.2->1.3   Underwent R 1st and 2nd toe amp yesterday. Wound vac in place. Feels good.   Objective:   Weight Range:  Vital Signs:   Temp:  [97.8 F (36.6 C)-98.3 F (36.8 C)] 98.1 F (36.7 C) (09/23 0400) Pulse Rate:  [108-116] 112 (09/23 0600) Resp:  [8-26] 16 (09/23 0600) BP: (83-116)/(55-86) 92/58 (09/23 0600) SpO2:  [94 %-100 %] 98 % (09/23 0830) Weight:  [68.9 kg (152 lb)] 68.9 kg (152 lb) (09/23 0700) Last BM Date: 03/23/16  Weight change: Filed Weights   03/22/16 0449 03/23/16 0500 03/24/16 0700  Weight: 74.6 kg (164 lb 8 oz) 72.3 kg (159 lb 6.3 oz) 68.9 kg (152 lb)    Intake/Output:   Intake/Output Summary (Last 24 hours) at 03/24/16 0936 Last data filed at 03/24/16 0800  Gross per 24 hour  Intake            816.5 ml  Output             4690 ml  Net          -3873.5 ml    Physical Exam: CVP 7-8 with prominent v-waves General:   No resp difficulty. Sitting in bed HEENT: normal Neck: supple. JVP 8 . Carotids 2+ bilat; no bruits. No lymphadenopathy or thryomegaly appreciated. Cor: PMI nondisplaced. Regular rate & rhythm. No rubs, gallops or murmurs. Lungs: Decreased RLL Abdomen: soft, nontender, mildly distended. No hepatosplenomegaly. No bruits or masses. Good bowel sounds. Extremities: no cyanosis, clubbing, rash, 1+ Edema. R foot wound vac in place Neuro: alert & orientedx3, cranial nerves grossly intact. moves all 4 extremities w/o difficulty. Affect pleasant  Telemetry: Sinus Tach 110s  Labs: Basic Metabolic Panel:  Recent Labs Lab 03/19/16 0454 03/20/16 0410 03/21/16 0857  03/22/16 0449 03/23/16 0950 03/24/16 0448  NA 132* 131* 134* 131* 133* 135  K 4.3 3.9 3.8 3.7 4.0 4.0  CL 98* 93* 93* 89* 90* 86*  CO2 23 28  --  30 33* 35*  GLUCOSE 89 104* 100* 183* 214* 194*  BUN 26* 27* 24* 26* 33* 32*  CREATININE 0.92 1.04 1.00 1.22 1.21 1.34*  CALCIUM 8.6* 8.5*  --  9.0 9.2 9.7  MG  --   --   --   --  1.7  --     Liver Function Tests:  Recent Labs Lab 03/19/16 0454 03/20/16 0410 03/22/16 0449  AST 29 23 19   ALT 23 22 19   ALKPHOS 233* 230* 232*  BILITOT 0.8 0.6 1.0  PROT 5.5* 5.1* 5.8*  ALBUMIN 2.5* 2.3* 2.8*   No results for input(s): LIPASE, AMYLASE in the last 168 hours. No results for input(s): AMMONIA in the last 168 hours.  CBC:  Recent Labs Lab 03/19/16 0454 03/20/16 0410 03/21/16 0857 03/22/16 0449 03/23/16 0950 03/24/16 0448  WBC 15.6* 11.4*  --  12.3* 14.8* 16.9*  HGB 10.1* 8.7* 10.2* 10.4* 10.2* 10.0*  HCT 31.0* 26.7* 30.0* 31.7* 30.8* 30.8*  MCV 89.3 89.0  --  88.3 87.7 88.8  PLT 266 266  --  386 508*  469*    Cardiac Enzymes: No results for input(s): CKTOTAL, CKMB, CKMBINDEX, TROPONINI in the last 168 hours.  BNP: BNP (last 3 results) No results for input(s): BNP in the last 8760 hours.  ProBNP (last 3 results) No results for input(s): PROBNP in the last 8760 hours.    Other results:  Imaging: No results found.   Medications:     Scheduled Medications: . amiodarone  200 mg Oral BID  . aspirin EC  325 mg Oral Daily   Or  . aspirin  324 mg Per Tube Daily  . bisacodyl  10 mg Oral Daily  . ceFEPime (MAXIPIME) IV  1 g Intravenous Q12H  . digoxin  0.125 mg Oral Daily  . docusate sodium  200 mg Oral Daily  . enoxaparin (LOVENOX) injection  40 mg Subcutaneous Q24H  . insulin aspart  0-15 Units Subcutaneous TID WC  . insulin aspart  0-5 Units Subcutaneous QHS  . insulin detemir  12 Units Subcutaneous BID  . mouth rinse  15 mL Mouth Rinse BID  . mometasone-formoterol  2 puff Inhalation BID  . nicotine  14 mg  Transdermal QHS  . pantoprazole  40 mg Oral Daily  . rosuvastatin  40 mg Oral q1800  . sodium chloride flush  10-40 mL Intracatheter Q12H  . spironolactone  25 mg Oral Daily  . torsemide  20 mg Oral BID    Infusions: . sodium chloride 250 mL (03/23/16 1004)  . sodium chloride 10 mL/hr at 03/23/16 1900  . lactated ringers Stopped (03/23/16 1600)  . milrinone 0.25 mcg/kg/min (03/24/16 0800)  . norepinephrine (LEVOPHED) Adult infusion 5 mcg/min (03/23/16 2256)    PRN Medications: acetaminophen **OR** acetaminophen, methocarbamol **OR** methocarbamol (ROBAXIN)  IV, metoCLOPramide **OR** metoCLOPramide (REGLAN) injection, ondansetron **OR** ondansetron (ZOFRAN) IV, oxyCODONE, sodium chloride flush, traMADol   Assessment:   1. Inferiro STEMI CAD-->S/P CABG x1 VSD Repair 2. Cardiogenic Shock 3. Ischemic R Great Toe s/p amputation 9/22 4. Hyperlipidemia 5. DM Type II 6. PAF 7. Hypoalbumin-2.3    Plan/Discussion:    Post Op Day 11. S/P CABG x1 VSD repair.   Todays CO-OX is 55%. For now contiinue milrione 0.25 mcg + Norepi 5 mcg. Will try to wean tomorrow if possible.  Volume status improved. CVP 7-8. Renal function relatively stable.  Suspect he may need home inotropes for some time with prominent RV failure.   Continue digoxin. Continue spiro 25 mg daily. Hold off on arb.   Remains in Sinus Tach- on amio 200 mg twice daily.   S/p R 1st and 2nd toe amputation. Wound vac in place.   Sorbitol for constipation.     Length of Stay: 11   Glori Bickers MD 03/24/2016, 9:36 AM  Advanced Heart Failure Team Pager [phone] (M-F; [place])  Please contact [facility] for night-coverage after hours (4p -7a ) and weekends on [email or web address]

## 2016-03-25 LAB — CARBOXYHEMOGLOBIN
CARBOXYHEMOGLOBIN: 2.1 % — AB (ref 0.5–1.5)
METHEMOGLOBIN: 0.8 % (ref 0.0–1.5)
O2 SAT: 65.3 %
TOTAL HEMOGLOBIN: 8.8 g/dL — AB (ref 12.0–16.0)

## 2016-03-25 LAB — BASIC METABOLIC PANEL
Anion gap: 11 (ref 5–15)
BUN: 38 mg/dL — AB (ref 6–20)
CALCIUM: 9 mg/dL (ref 8.9–10.3)
CO2: 34 mmol/L — ABNORMAL HIGH (ref 22–32)
CREATININE: 1.5 mg/dL — AB (ref 0.61–1.24)
Chloride: 84 mmol/L — ABNORMAL LOW (ref 101–111)
GFR, EST AFRICAN AMERICAN: 55 mL/min — AB (ref >60)
GFR, EST NON AFRICAN AMERICAN: 47 mL/min — AB (ref >60)
Glucose, Bld: 231 mg/dL — ABNORMAL HIGH (ref 65–99)
Potassium: 3.6 mmol/L (ref 3.5–5.1)
SODIUM: 129 mmol/L — AB (ref 135–145)

## 2016-03-25 LAB — CBC
HCT: 28.4 % — ABNORMAL LOW (ref 39.0–52.0)
HEMOGLOBIN: 9.4 g/dL — AB (ref 13.0–17.0)
MCH: 29 pg (ref 26.0–34.0)
MCHC: 33.1 g/dL (ref 30.0–36.0)
MCV: 87.7 fL (ref 78.0–100.0)
PLATELETS: 413 10*3/uL — AB (ref 150–400)
RBC: 3.24 MIL/uL — ABNORMAL LOW (ref 4.22–5.81)
RDW: 12.9 % (ref 11.5–15.5)
WBC: 16.2 10*3/uL — ABNORMAL HIGH (ref 4.0–10.5)

## 2016-03-25 LAB — GLUCOSE, CAPILLARY
GLUCOSE-CAPILLARY: 228 mg/dL — AB (ref 65–99)
Glucose-Capillary: 196 mg/dL — ABNORMAL HIGH (ref 65–99)
Glucose-Capillary: 240 mg/dL — ABNORMAL HIGH (ref 65–99)
Glucose-Capillary: 233 mg/dL — ABNORMAL HIGH (ref 65–99)

## 2016-03-25 MED ORDER — POTASSIUM CHLORIDE CRYS ER 20 MEQ PO TBCR
40.0000 meq | EXTENDED_RELEASE_TABLET | Freq: Once | ORAL | Status: AC
  Administered 2016-03-25: 40 meq via ORAL
  Filled 2016-03-25: qty 2

## 2016-03-25 NOTE — Progress Notes (Signed)
Advanced Heart Failure Rounding Note   Subjective:    Asked by Dr Prescott Gum to follow for cardiogenic shock and marked volume overload. He is post op day 9 S/P CABG x1 and VSD repair.   Underwent R 1st and 2nd toe amp 9/23. Wound vac in place.  Remains on milrinone 0.25 mcg + norepi 5 mcg. SBP 90-105  Now on po torsemide. Weight up 2 pounds. Todays CO-OX 62%-> 55% -> 65%. CVP 8 Creatinine continues to creep up 1.2->1.3 -> 1.5  Feels good.   Objective:   Weight Range:  Vital Signs:   Temp:  [97.9 F (36.6 C)-98.8 F (37.1 C)] 98.7 F (37.1 C) (09/24 0748) Pulse Rate:  [110-116] 114 (09/24 0700) Resp:  [12-25] 25 (09/24 0700) BP: (82-105)/(55-71) 105/62 (09/24 0700) SpO2:  [94 %-100 %] 95 % (09/24 0748) Weight:  [70.3 kg (154 lb 15.7 oz)] 70.3 kg (154 lb 15.7 oz) (09/24 0433) Last BM Date: 03/22/16  Weight change: Filed Weights   03/23/16 0500 03/24/16 0700 03/25/16 0433  Weight: 72.3 kg (159 lb 6.3 oz) 68.9 kg (152 lb) 70.3 kg (154 lb 15.7 oz)    Intake/Output:   Intake/Output Summary (Last 24 hours) at 03/25/16 1141 Last data filed at 03/25/16 1000  Gross per 24 hour  Intake            895.1 ml  Output             2250 ml  Net          -1354.9 ml    Physical Exam: CVP 7-8 with prominent v-waves General:   No resp difficulty. lyinging in bed HEENT: normal Neck: supple. JVP 8 . Carotids 2+ bilat; no bruits. No lymphadenopathy or thryomegaly appreciated. Cor: PMI nondisplaced. Regular rate & rhythm. No rubs, gallops or murmurs. Lungs: clear Abdomen: soft, nontender, mildly distended. No hepatosplenomegaly. No bruits or masses. Good bowel sounds. Extremities: no cyanosis, clubbing, rash, trace Edema. R foot wound vac in place Neuro: alert & orientedx3, cranial nerves grossly intact. moves all 4 extremities w/o difficulty. Affect pleasant  Telemetry: Sinus Tach 110s  Labs: Basic Metabolic Panel:  Recent Labs Lab 03/20/16 0410 03/21/16 0857  03/22/16 0449 03/23/16 0950 03/24/16 0448 03/25/16 0327  NA 131* 134* 131* 133* 135 129*  K 3.9 3.8 3.7 4.0 4.0 3.6  CL 93* 93* 89* 90* 86* 84*  CO2 28  --  30 33* 35* 34*  GLUCOSE 104* 100* 183* 214* 194* 231*  BUN 27* 24* 26* 33* 32* 38*  CREATININE 1.04 1.00 1.22 1.21 1.34* 1.50*  CALCIUM 8.5*  --  9.0 9.2 9.7 9.0  MG  --   --   --  1.7  --   --     Liver Function Tests:  Recent Labs Lab 03/19/16 0454 03/20/16 0410 03/22/16 0449  AST 29 23 19   ALT 23 22 19   ALKPHOS 233* 230* 232*  BILITOT 0.8 0.6 1.0  PROT 5.5* 5.1* 5.8*  ALBUMIN 2.5* 2.3* 2.8*   No results for input(s): LIPASE, AMYLASE in the last 168 hours. No results for input(s): AMMONIA in the last 168 hours.  CBC:  Recent Labs Lab 03/20/16 0410 03/21/16 0857 03/22/16 0449 03/23/16 0950 03/24/16 0448 03/25/16 0327  WBC 11.4*  --  12.3* 14.8* 16.9* 16.2*  HGB 8.7* 10.2* 10.4* 10.2* 10.0* 9.4*  HCT 26.7* 30.0* 31.7* 30.8* 30.8* 28.4*  MCV 89.0  --  88.3 87.7 88.8 87.7  PLT 266  --  386 508* 469* 413*    Cardiac Enzymes: No results for input(s): CKTOTAL, CKMB, CKMBINDEX, TROPONINI in the last 168 hours.  BNP: BNP (last 3 results) No results for input(s): BNP in the last 8760 hours.  ProBNP (last 3 results) No results for input(s): PROBNP in the last 8760 hours.    Other results:  Imaging: No results found.   Medications:     Scheduled Medications: . amiodarone  200 mg Oral BID  . aspirin EC  325 mg Oral Daily   Or  . aspirin  324 mg Per Tube Daily  . bisacodyl  10 mg Oral Daily  . ceFEPime (MAXIPIME) IV  1 g Intravenous Q12H  . digoxin  0.125 mg Oral Daily  . docusate sodium  200 mg Oral Daily  . enoxaparin (LOVENOX) injection  40 mg Subcutaneous Q24H  . insulin aspart  0-15 Units Subcutaneous TID WC  . insulin aspart  0-5 Units Subcutaneous QHS  . insulin detemir  12 Units Subcutaneous BID  . mouth rinse  15 mL Mouth Rinse BID  . mometasone-formoterol  2 puff Inhalation BID   . nicotine  14 mg Transdermal QHS  . pantoprazole  40 mg Oral Daily  . rosuvastatin  40 mg Oral q1800  . sodium chloride flush  10-40 mL Intracatheter Q12H  . spironolactone  25 mg Oral Daily  . torsemide  20 mg Oral BID    Infusions: . sodium chloride Stopped (03/24/16 1900)  . sodium chloride 10 mL/hr at 03/25/16 0700  . lactated ringers Stopped (03/23/16 1600)  . milrinone 0.25 mcg/kg/min (03/25/16 0935)  . norepinephrine (LEVOPHED) Adult infusion 5 mcg/min (03/25/16 0700)    PRN Medications: acetaminophen **OR** acetaminophen, methocarbamol **OR** methocarbamol (ROBAXIN)  IV, metoCLOPramide **OR** metoCLOPramide (REGLAN) injection, ondansetron **OR** ondansetron (ZOFRAN) IV, oxyCODONE, sodium chloride flush, traMADol   Assessment:   1. Inferiro STEMI CAD-->S/P CABG x1 VSD Repair 2. Cardiogenic Shock 3. Ischemic R Great Toe s/p amputation 9/22 4. Hyperlipidemia 5. DM Type II 6. PAF 7. Hypoalbumin-2.3    Plan/Discussion:    Post Op Day 12. S/P CABG x1 VSD repair.   Co-ox improving. Volume status about as good as we are going to get it with RV failure.  CVP 7-8. Renal function slightly worse. Will stop torsemide today and resume at 20 daily tomorrow.  Continue  milrione 0.25 mcg. Wean norepi. Keep SBP >= 90 Suspect he may need home milrinone for some time with prominent RV failure.   Continue digoxin. Continue spiro 25 mg daily. Hold off on arb.   Remains in Sinus Tach- on amio 200 mg twice daily.   S/p R 1st and 2nd toe amputation. Wound vac in place.   Supp K.    Length of Stay: 12   Glori Bickers MD 03/25/2016, 11:41 AM  Advanced Heart Failure Team Pager [phone] (M-F; 7a - 4p)  Please contact [facility] for night-coverage after hours (4p -7a ) and weekends on [email or web address]

## 2016-03-25 NOTE — Progress Notes (Addendum)
      [address]       [place] [address]             [phone]      2 Days Post-Op Procedure(s) (LRB): 1st and 2nd Ray Amputation Right Foot (Right)  Subjective:  Awoke patient from sleep.  Continues to have no complaints  Objective: Vital signs in last 24 hours: Temp:  [97.9 F (36.6 C)-98.8 F (37.1 C)] 98.7 F (37.1 C) (09/24 0748) Pulse Rate:  [110-116] 114 (09/24 0700) Cardiac Rhythm: Sinus tachycardia (09/24 0745) Resp:  [12-25] 25 (09/24 0700) BP: (82-105)/(55-71) 105/62 (09/24 0700) SpO2:  [94 %-100 %] 95 % (09/24 0748) Weight:  [154 lb 15.7 oz (70.3 kg)] 154 lb 15.7 oz (70.3 kg) (09/24 0433)  Hemodynamic parameters for last 24 hours: CVP:  [3 mmHg-8 mmHg] 3 mmHg  Intake/Output from previous day: 09/23 0701 - 09/24 0700 In: 1020 [P.O.:425; I.V.:495; IV Piggyback:100] Out: 2250 [Urine:2250] Intake/Output this shift: Total I/O In: 110.6 [I.V.:60.6; IV Piggyback:50] Out: -   General appearance: alert, cooperative and no distress Heart: regular rate and rhythm and tachy Lungs: clear to auscultation bilaterally Abdomen: soft, non-tender; bowel sounds normal; no masses,  no organomegaly  Lab Results:  Recent Labs  03/24/16 0448 03/25/16 0327  WBC 16.9* 16.2*  HGB 10.0* 9.4*  HCT 30.8* 28.4*  PLT 469* 413*   BMET:  Recent Labs  03/24/16 0448 03/25/16 0327  NA 135 129*  K 4.0 3.6  CL 86* 84*  CO2 35* 34*  GLUCOSE 194* 231*  BUN 32* 38*  CREATININE 1.34* 1.50*  CALCIUM 9.7 9.0    PT/INR: No results for input(s): LABPROT, INR in the last 72 hours. ABG    Component Value Date/Time   PHART 7.427 03/16/2016 0420   HCO3 25.3 03/16/2016 0420   TCO2 28 03/21/2016 0857   ACIDBASEDEF 2.0 03/14/2016 1715   O2SAT 65.3 03/25/2016 0320   CBG (last 3)   Recent Labs  03/24/16 1636 03/24/16 2135 03/25/16 0739  GLUCAP 162* 309* 196*    Assessment/Plan: S/P Procedure(s) (LRB): 1st and 2nd Ray Amputation Right Foot  (Right)  1.CV- remains on Milrinone and Levophed drips Co-ox at 65% this morning- AHF managing and patient may require intropes at discharge.. On Digoxin and Amiodarone 2. Pulm- weaning oxygen as tolearted 3. Renal- creatinine elevated further today at 1.50-on Aldactone, Demedex may need to back off on diuretics 4. Right Foot Gangrene- S/P Debridement 5. DM- sugars controlled 6. Dispo- Co-OX better remains on Milrinone and Levophed, creatinine rising up to 1.50 may need to decrease diuretics, continue current care   LOS: 12 days    Ahmed Prima, ERIN 03/25/2016   Chart reviewed, patient examined, agree with above. I think he is as dry as he is going to tolerate. SBP is 88 and resting tachy 113 on milrinone and levophed.

## 2016-03-26 ENCOUNTER — Encounter (HOSPITAL_COMMUNITY): Payer: Self-pay | Admitting: Orthopedic Surgery

## 2016-03-26 LAB — GLUCOSE, CAPILLARY
GLUCOSE-CAPILLARY: 156 mg/dL — AB (ref 65–99)
GLUCOSE-CAPILLARY: 266 mg/dL — AB (ref 65–99)
GLUCOSE-CAPILLARY: 125 mg/dL — AB (ref 65–99)
GLUCOSE-CAPILLARY: 223 mg/dL — AB (ref 65–99)
Glucose-Capillary: 200 mg/dL — ABNORMAL HIGH (ref 65–99)

## 2016-03-26 LAB — BASIC METABOLIC PANEL
Anion gap: 10 (ref 5–15)
BUN: 39 mg/dL — ABNORMAL HIGH (ref 6–20)
CHLORIDE: 89 mmol/L — AB (ref 101–111)
CO2: 33 mmol/L — ABNORMAL HIGH (ref 22–32)
Calcium: 9.4 mg/dL (ref 8.9–10.3)
Creatinine, Ser: 1.4 mg/dL — ABNORMAL HIGH (ref 0.61–1.24)
GFR calc non Af Amer: 52 mL/min — ABNORMAL LOW (ref >60)
GFR, EST AFRICAN AMERICAN: 60 mL/min — AB (ref >60)
Glucose, Bld: 184 mg/dL — ABNORMAL HIGH (ref 65–99)
POTASSIUM: 4.5 mmol/L (ref 3.5–5.1)
SODIUM: 132 mmol/L — AB (ref 135–145)

## 2016-03-26 LAB — CARBOXYHEMOGLOBIN
Carboxyhemoglobin: 1.7 % — ABNORMAL HIGH (ref 0.5–1.5)
Methemoglobin: 0.8 % (ref 0.0–1.5)
O2 Saturation: 59 %
TOTAL HEMOGLOBIN: 12 g/dL (ref 12.0–16.0)

## 2016-03-26 NOTE — Progress Notes (Addendum)
      [address]       [place] [address]             [phone]      3 Days Post-Op Procedure(s) (LRB): 1st and 2nd Ray Amputation Right Foot (Right) Subjective: Feels good with no pain. No issues overnight.  Objective: Vital signs in last 24 hours: Temp:  [97.9 F (36.6 C)-98.3 F (36.8 C)] 97.9 F (36.6 C) (09/25 1212) Pulse Rate:  [106-113] 108 (09/25 1212) Cardiac Rhythm: Sinus tachycardia (09/25 1205) Resp:  [12-25] 17 (09/25 1212) BP: (72-130)/(28-74) 78/51 (09/25 1212) SpO2:  [93 %-100 %] 96 % (09/25 1212) Weight:  [153 lb (69.4 kg)] 153 lb (69.4 kg) (09/25 0500)  Hemodynamic parameters for last 24 hours: CVP:  [2 mmHg-8 mmHg] 2 mmHg  Intake/Output from previous day: 09/24 0701 - 09/25 0700 In: 1444 [P.O.:990; I.V.:354; IV Piggyback:100] Out: 2050 [Urine:2050] Intake/Output this shift: Total I/O In: 376.2 [P.O.:280; I.V.:46.2; IV Piggyback:50] Out: -   General appearance: alert and cooperative Heart: regular rate and rhythm Lungs: clear to auscultation bilaterally Abdomen: soft, non-tender; bowel sounds normal; no masses,  no organomegaly Extremities: no edema. right ankle and foot wrapped with ACE bandage Wound: c/d/i  Lab Results:  Recent Labs  03/24/16 0448 03/25/16 0327  WBC 16.9* 16.2*  HGB 10.0* 9.4*  HCT 30.8* 28.4*  PLT 469* 413*   BMET:  Recent Labs  03/25/16 0327 03/26/16 0500  NA 129* 132*  K 3.6 4.5  CL 84* 89*  CO2 34* 33*  GLUCOSE 231* 184*  BUN 38* 39*  CREATININE 1.50* 1.40*  CALCIUM 9.0 9.4    PT/INR: No results for input(s): LABPROT, INR in the last 72 hours. ABG    Component Value Date/Time   PHART 7.427 03/16/2016 0420   HCO3 25.3 03/16/2016 0420   TCO2 28 03/21/2016 0857   ACIDBASEDEF 2.0 03/14/2016 1715   O2SAT 59.0 03/26/2016 0445   CBG (last 3)   Recent Labs  03/25/16 2106 03/26/16 0750 03/26/16 1215  GLUCAP 228* 156* 266*    Assessment/Plan: S/P Procedure(s) (LRB): 1st and  2nd Ray Amputation Right Foot (Right)  S/P emergent CABG x 1, emergent repair of post-MI inferior septal VSD 03/13/2016  1.CV- remains on Milrinone and Levophed drips Co-ox at 59% this morning- AHF managing and patient may require intropes at discharge. On Digoxin and Amiodarone 2. Pulm- weaning oxygen as tolearted 3. Renal- creatinine elevated further today at 1.40-on Aldactone, holding Demadex. Good urine output. Will monitor closely. 4. Right Foot Gangrene- S/P Debridement, management per Ortho. 5. DM- sugars controlled 6. Dispo- Co-OX better remains on Milrinone and Levophed, creatinine now trending down with good urine output. Holding Demadex. Titration of Levophed for SBP > 85.    LOS: 13 days    Elgie Collard 03/26/2016 Surgical incisions clean and dry Improving under care of advanced heart failure service which is greatly appreciated Start Coumadin once it is clear he will not need further surgery on the right lower extremity

## 2016-03-26 NOTE — Progress Notes (Signed)
Physical Therapy Treatment Patient Details Name: William [name] MRN: [id number] DOB: 1951-05-24 Today's Date: 03/26/2016    History of Present Illness 65 yo s/p acute MI with CABGx1 and septal defect repair. On/off levophed due to hypotension. Underwent rt 1st and 2nd toe amputation 9/22 by Dr. Sharol Given. PMHx: DM, smoker, HTN, HLD, right great toe gangrene    PT Comments    William [name] remains very pleasant and moving well but limited in function due to continued TDWB status of RLE with sternal precautions. Pt performs pivot transfers well and at this time will be limited to pivoting and HEP until weight bearing status increased. Pt aware and follows restrictions. Educated for HEP with feet elevated on foot stool with pillow end of session.   HR 103-111 sats 98% RA BP 81/53  Follow Up Recommendations  Home health PT;Supervision for mobility/OOB     Equipment Recommendations  Wheelchair (measurements PT)    Recommendations for Other Services       Precautions / Restrictions Precautions Precautions: Sternal Precaution Comments: pt able to state and generalize all precautions  Other Brace/Splint: darco shoe rt foot Restrictions RLE Weight Bearing: Touchdown weight bearing    Mobility  Bed Mobility Overal bed mobility: Modified Independent             General bed mobility comments: with HOB 20degrees  Transfers Overall transfer level: Needs assistance   Transfers: Sit to/from Stand Sit to Stand: Supervision Stand pivot transfers: Supervision       General transfer comment: supervision for lines, pt able to stand from bed maintaining precautions and pivot to bSC then to chair with min cues and no physical assist  Ambulation/Gait             General Gait Details: deferred due to difficulty with having both sternal precuations and limited wt bearing on RLE   Stairs            Wheelchair Mobility    Modified Rankin (Stroke Patients Only)       Balance                                     Cognition Arousal/Alertness: Awake/alert Behavior During Therapy: WFL for tasks assessed/performed Overall Cognitive Status: Within Functional Limits for tasks assessed                      Exercises General Exercises - Lower Extremity Ankle Circles/Pumps: AROM;20 reps;Both;Seated Long Arc Quad: AROM;Both;Seated;Other (comment) (25 reps) Hip ABduction/ADduction: AROM;Both;Seated;Other (comment) (25 reps) Hip Flexion/Marching: AROM;Other reps (comment);Seated;Both (25 reps)    General Comments        Pertinent Vitals/Pain Pain Assessment: 0-10 Pain Score: 4  Pain Location: right foot Pain Descriptors / Indicators: Aching Pain Intervention(s): Limited activity within patient's tolerance;Monitored during session;Patient requesting pain meds-RN notified;Repositioned    Home Living                      Prior Function            PT Goals (current goals can now be found in the care plan section) Progress towards PT goals: Progressing toward goals    Frequency           PT Plan Current plan remains appropriate    Co-evaluation             End of Session   Activity Tolerance: Patient  tolerated treatment well Patient left: in chair;with call bell/phone within [name]     Time: 1223-1242 PT Time Calculation (min) (ACUTE ONLY): 19 min  Charges:  $Therapeutic Exercise: 8-22 mins                    G Codes:      William [name] 04-12-16, 1:38 PM William [name], William [name]

## 2016-03-26 NOTE — Progress Notes (Signed)
Patient ID: William [name], male   DOB: 22-May-1951, 65 y.o.   MRN: [id number] Postoperative day 3 right foot first and second Ray amputation. The wound VAC is functioning well continue minimize weightbearing right lower extremity discontinue wound VAC when it alarms and stops working and apply a dry dressing at that time I will follow-up in the office in 1 week.

## 2016-03-26 NOTE — Progress Notes (Signed)
Inpatient Diabetes Program Recommendations  AACE/ADA: New Consensus Statement on Inpatient Glycemic Control (2015)  Target Ranges:  Prepandial:   less than 140 mg/dL      Peak postprandial:   less than 180 mg/dL (1-2 hours)      Critically ill patients:  140 - 180 mg/dL   Lab Results  Component Value Date   GLUCAP 156 (H) 03/26/2016   HGBA1C 10.7 (H) 03/13/2016    Review of Glycemic Control Results for William [name], William [name] (MRN [id number]) as of 03/26/2016 10:21  Ref. Range 03/25/2016 07:39 03/25/2016 12:18 03/25/2016 16:39 03/25/2016 21:06 03/26/2016 07:50  Glucose-Capillary Latest Ref Range: 65 - 99 mg/dL 196 (H) 240 (H) 233 (H) 228 (H) 156 (H)    Inpatient Diabetes Program Recommendations:   Noted postprandial CBGs elevated. Please consider adding Novolog 2-3 units tid meal coverage while oral DM medication held.  Thank you, Nani Gasser. Ayslin Kundert, RN, MSN, CDE Inpatient Glycemic Control Team Team Pager [phone] (8am-5pm) 03/26/2016 10:22 AM

## 2016-03-26 NOTE — Progress Notes (Signed)
Advanced Heart Failure Rounding Note   Subjective:    Asked by Dr Prescott Gum to follow for cardiogenic shock and marked volume overload. He is post op day 9 S/P CABG x1 and VSD repair.   Underwent R 1st and 2nd toe amp 9/23. Wound vac in place.  Remains on milrinone 0.25 mcg + norepi 5 mcg. Norepi weaned to 3 but SBP went to 80 so now back up to 5.   Torsemide held yesterday due to worsening renal function. Weight stable. Remains with good urine output. Todays CO-OX 59% . CVP 7-8. Creatinine improved slightly 1.2->1.3 -> 1.5 -> 1.4  Feels fine. No lightheadedness.   Objective:   Weight Range:  Vital Signs:   Temp:  [97.9 F (36.6 C)-98.7 F (37.1 C)] 97.9 F (36.6 C) (09/25 0500) Pulse Rate:  [106-114] 107 (09/25 0500) Resp:  [12-25] 19 (09/25 0500) BP: (81-109)/(53-64) 95/62 (09/25 0500) SpO2:  [93 %-100 %] 96 % (09/25 0500) Weight:  [69.4 kg (153 lb)] 69.4 kg (153 lb) (09/25 0500) Last BM Date: 03/22/16  Weight change: Filed Weights   03/24/16 0700 03/25/16 0433 03/26/16 0500  Weight: 68.9 kg (152 lb) 70.3 kg (154 lb 15.7 oz) 69.4 kg (153 lb)    Intake/Output:   Intake/Output Summary (Last 24 hours) at 03/26/16 0609 Last data filed at 03/26/16 0500  Gross per 24 hour  Intake          1435.63 ml  Output             2650 ml  Net         -1214.37 ml    Physical Exam: CVP 7-8 with prominent v-waves General:   No resp difficulty. Sitting up in bed HEENT: normal Neck: supple. JVP 8 . Carotids 2+ bilat; no bruits. No lymphadenopathy or thryomegaly appreciated. Cor: PMI nondisplaced. Regular rate & rhythm. No rubs, gallops or murmurs. Lungs: clear Abdomen: soft, nontender, mildly distended. No hepatosplenomegaly. No bruits or masses. Good bowel sounds. Extremities: no cyanosis, clubbing, rash, trace Edema. R foot wound vac in place Neuro: alert & orientedx3, cranial nerves grossly intact. moves all 4 extremities w/o difficulty. Affect pleasant  Telemetry:  Sinus Tach 100-110s  Labs: Basic Metabolic Panel:  Recent Labs Lab 03/22/16 0449 03/23/16 0950 03/24/16 0448 03/25/16 0327 03/26/16 0500  NA 131* 133* 135 129* 132*  K 3.7 4.0 4.0 3.6 4.5  CL 89* 90* 86* 84* 89*  CO2 30 33* 35* 34* 33*  GLUCOSE 183* 214* 194* 231* 184*  BUN 26* 33* 32* 38* 39*  CREATININE 1.22 1.21 1.34* 1.50* 1.40*  CALCIUM 9.0 9.2 9.7 9.0 9.4  MG  --  1.7  --   --   --     Liver Function Tests:  Recent Labs Lab 03/20/16 0410 03/22/16 0449  AST 23 19  ALT 22 19  ALKPHOS 230* 232*  BILITOT 0.6 1.0  PROT 5.1* 5.8*  ALBUMIN 2.3* 2.8*   No results for input(s): LIPASE, AMYLASE in the last 168 hours. No results for input(s): AMMONIA in the last 168 hours.  CBC:  Recent Labs Lab 03/20/16 0410 03/21/16 0857 03/22/16 0449 03/23/16 0950 03/24/16 0448 03/25/16 0327  WBC 11.4*  --  12.3* 14.8* 16.9* 16.2*  HGB 8.7* 10.2* 10.4* 10.2* 10.0* 9.4*  HCT 26.7* 30.0* 31.7* 30.8* 30.8* 28.4*  MCV 89.0  --  88.3 87.7 88.8 87.7  PLT 266  --  386 508* 469* 413*    Cardiac Enzymes: No  results for input(s): CKTOTAL, CKMB, CKMBINDEX, TROPONINI in the last 168 hours.  BNP: BNP (last 3 results) No results for input(s): BNP in the last 8760 hours.  ProBNP (last 3 results) No results for input(s): PROBNP in the last 8760 hours.    Other results:  Imaging: No results found.   Medications:     Scheduled Medications: . amiodarone  200 mg Oral BID  . aspirin EC  325 mg Oral Daily   Or  . aspirin  324 mg Per Tube Daily  . bisacodyl  10 mg Oral Daily  . ceFEPime (MAXIPIME) IV  1 g Intravenous Q12H  . digoxin  0.125 mg Oral Daily  . docusate sodium  200 mg Oral Daily  . enoxaparin (LOVENOX) injection  40 mg Subcutaneous Q24H  . insulin aspart  0-15 Units Subcutaneous TID WC  . insulin aspart  0-5 Units Subcutaneous QHS  . insulin detemir  12 Units Subcutaneous BID  . mouth rinse  15 mL Mouth Rinse BID  . mometasone-formoterol  2 puff Inhalation  BID  . nicotine  14 mg Transdermal QHS  . pantoprazole  40 mg Oral Daily  . rosuvastatin  40 mg Oral q1800  . sodium chloride flush  10-40 mL Intracatheter Q12H  . spironolactone  25 mg Oral Daily    Infusions: . sodium chloride Stopped (03/24/16 1900)  . sodium chloride 10 mL/hr at 03/25/16 0700  . lactated ringers Stopped (03/23/16 1600)  . milrinone 0.25 mcg/kg/min (03/26/16 0334)  . norepinephrine (LEVOPHED) Adult infusion 4 mcg/min (03/26/16 0606)    PRN Medications: acetaminophen **OR** acetaminophen, methocarbamol **OR** methocarbamol (ROBAXIN)  IV, metoCLOPramide **OR** metoCLOPramide (REGLAN) injection, ondansetron **OR** ondansetron (ZOFRAN) IV, oxyCODONE, sodium chloride flush, traMADol   Assessment:   1. Inferiro STEMI CAD-->S/P CABG x1 VSD Repair 2. Cardiogenic Shock 3. Ischemic R Great Toe s/p amputation 9/22 4. Hyperlipidemia 5. DM Type II 6. PAF 7. Hypoalbumin-2.3  8. AKI 9. Hypokalemia   Plan/Discussion:    Post Op Day 13. S/P CABG x1 VSD repair.   Co-ox improved on milrinone on low-dose norepi. Failed norepi wean yesterday due to low BP. Will try again today. Tolerate SBP 85 or greater. Continue to hold torsemide.  Continue  milrione 0.25 mcg. If unable to wean norepi will switch milrinone to dobutamine. Suspect he may need home inotropes for some time with prominent RV failure.   Continue digoxin. Continue spiro 25 mg daily. Hold off on arb.   Remains in Sinus Tach- on amio 200 mg twice daily.   S/p R 1st and 2nd toe amputation. Wound vac in place.   Hypokalemia improved.    Length of Stay: 13  Glori Bickers MD 03/26/2016, 6:09 AM  Advanced Heart Failure Team Pager [phone] (M-F; [place])  Please contact [facility] for night-coverage after hours (4p -7a ) and weekends on [email or web address]

## 2016-03-26 NOTE — Anesthesia Postprocedure Evaluation (Signed)
Anesthesia Post Note  Patient: William [name]  Procedure(s) Performed: Procedure(s) (LRB): 1st and 2nd Ray Amputation Right Foot (Right)  Patient location during evaluation: PACU Anesthesia Type: MAC and Regional Level of consciousness: awake and alert Pain management: pain level controlled Vital Signs Assessment: post-procedure vital signs reviewed and stable Respiratory status: spontaneous breathing, nonlabored ventilation, respiratory function stable and patient connected to nasal cannula oxygen Cardiovascular status: stable and blood pressure returned to baseline Anesthetic complications: no    Last Vitals:  Vitals:   03/26/16 1730 03/26/16 1800  BP:  (!) 84/63  Pulse:  (!) 111  Resp: 19 18  Temp:      Last Pain:  Vitals:   03/26/16 1700  TempSrc: Oral  PainSc:                  [place] S

## 2016-03-27 DIAGNOSIS — I959 Hypotension, unspecified: Secondary | ICD-10-CM

## 2016-03-27 LAB — GLUCOSE, CAPILLARY
GLUCOSE-CAPILLARY: 162 mg/dL — AB (ref 65–99)
GLUCOSE-CAPILLARY: 235 mg/dL — AB (ref 65–99)
Glucose-Capillary: 189 mg/dL — ABNORMAL HIGH (ref 65–99)
Glucose-Capillary: 235 mg/dL — ABNORMAL HIGH (ref 65–99)

## 2016-03-27 LAB — BASIC METABOLIC PANEL
ANION GAP: 10 (ref 5–15)
BUN: 36 mg/dL — AB (ref 6–20)
CALCIUM: 9.2 mg/dL (ref 8.9–10.3)
CO2: 30 mmol/L (ref 22–32)
Chloride: 89 mmol/L — ABNORMAL LOW (ref 101–111)
Creatinine, Ser: 1.3 mg/dL — ABNORMAL HIGH (ref 0.61–1.24)
GFR calc Af Amer: >60 mL/min (ref >60)
GFR, EST NON AFRICAN AMERICAN: 56 mL/min — AB (ref >60)
GLUCOSE: 172 mg/dL — AB (ref 65–99)
POTASSIUM: 4.4 mmol/L (ref 3.5–5.1)
Sodium: 129 mmol/L — ABNORMAL LOW (ref 135–145)

## 2016-03-27 LAB — CARBOXYHEMOGLOBIN
CARBOXYHEMOGLOBIN: 2.2 % — AB (ref 0.5–1.5)
METHEMOGLOBIN: 0.4 % (ref 0.0–1.5)
O2 Saturation: 70.3 %
TOTAL HEMOGLOBIN: 9.1 g/dL — AB (ref 12.0–16.0)

## 2016-03-27 MED ORDER — MIDODRINE HCL 5 MG PO TABS
5.0000 mg | ORAL_TABLET | Freq: Three times a day (TID) | ORAL | Status: DC
  Administered 2016-03-27 – 2016-04-03 (×21): 5 mg via ORAL
  Filled 2016-03-27 (×21): qty 1

## 2016-03-27 MED ORDER — AMIODARONE HCL 200 MG PO TABS
200.0000 mg | ORAL_TABLET | Freq: Every day | ORAL | Status: DC
  Administered 2016-03-28 – 2016-04-03 (×7): 200 mg via ORAL
  Filled 2016-03-27 (×7): qty 1

## 2016-03-27 MED ORDER — AMIODARONE HCL 200 MG PO TABS: 200.0000 mg | ORAL_TABLET | Freq: Every day | ORAL | Status: DC

## 2016-03-27 NOTE — Progress Notes (Addendum)
      [address]       [place] [address]             [phone]      4 Days Post-Op Procedure(s) (LRB): 1st and 2nd Ray Amputation Right Foot (Right) Subjective: Feels good today. Eating well.  Objective: Vital signs in last 24 hours: Temp:  [97.4 F (36.3 C)-98.5 F (36.9 C)] 98.5 F (36.9 C) (09/26 0900) Pulse Rate:  [103-111] 108 (09/26 0935) Cardiac Rhythm: Sinus tachycardia (09/26 0800) Resp:  [14-25] 21 (09/26 0900) BP: (72-114)/(28-69) 92/62 (09/26 0900) SpO2:  [94 %-100 %] 96 % (09/26 0901) Weight:  [161 lb 4.8 oz (73.2 kg)] 161 lb 4.8 oz (73.2 kg) (09/26 0600)  Hemodynamic parameters for last 24 hours: CVP:  [7 mmHg-10 mmHg] 9 mmHg  Intake/Output from previous day: 09/25 0701 - 09/26 0700 In: 1380.1 [P.O.:1000; I.V.:330.1; IV Piggyback:50] Out: 1000 [Urine:1000] Intake/Output this shift: Total I/O In: 300 [P.O.:300] Out: -   General appearance: alert, cooperative and no distress Heart: S1, S2 normal, sinus tachycardia Lungs: clear to auscultation bilaterally Abdomen: soft, non-tender; bowel sounds normal; no masses,  no organomegaly Extremities: extremities normal, atraumatic, no cyanosis or edema Wound: c/d/i  Lab Results:  Recent Labs  03/25/16 0327  WBC 16.2*  HGB 9.4*  HCT 28.4*  PLT 413*   BMET:  Recent Labs  03/26/16 0500 03/27/16 0445  NA 132* 129*  K 4.5 4.4  CL 89* 89*  CO2 33* 30  GLUCOSE 184* 172*  BUN 39* 36*  CREATININE 1.40* 1.30*  CALCIUM 9.4 9.2    PT/INR: No results for input(s): LABPROT, INR in the last 72 hours. ABG    Component Value Date/Time   PHART 7.427 03/16/2016 0420   HCO3 25.3 03/16/2016 0420   TCO2 28 03/21/2016 0857   ACIDBASEDEF 2.0 03/14/2016 1715   O2SAT 70.3 03/27/2016 0452   CBG (last 3)   Recent Labs  03/26/16 1750 03/26/16 2121 03/27/16 0926  GLUCAP 125* 223* 189*    Assessment/Plan: S/P Procedure(s) (LRB): 1st and 2nd Ray Amputation Right Foot (Right) S/P  emergent CABG x 1, emergent repair of post-MI inferior septal VSD 03/13/2016  1.CV- remains on Milrinone and Levophed drips Co-ox at 70% this morning- AHF managing and patient may require intropes at discharge. On Digoxin and Amiodarone. Amio decreased to 200mg  daily. 2. Pulm- tolerating RA with good oxygen saturation. 3. Renal- creatinine elevated further today at 1.30-on Aldactone, holding Demadex. Good urine output. Will monitor closely. 4. Right Foot Gangrene- S/P Debridement, management per Ortho. 5. DM- sugars controlled 6. Dispo- Co-OX better remains on Milrinone and Levophed, added Midodrine today. Creatinine now trending down with good urine output. Holding Demadex. Titration of Levophed for SBP > 85. SPB improved today in the 90s. Plan for PT/OT at home at discharge. Will require Coumadin therapy once okay with Ortho.     LOS: 14 days    William [name] 03/27/2016 looks better today 4 days after R foot Ray amputation Appreciate Advance HF care of ischemic CM patient examined and medical record reviewed,agree with above note. Tharon Aquas Trigt III 03/27/2016

## 2016-03-27 NOTE — Progress Notes (Signed)
Occupational Therapy Treatment Patient Details Name: William [name] MRN: [id number] DOB: [birth year] Today's Date: 03/27/2016    History of present illness 65 yo s/p acute MI with CABGx1 and septal defect repair. On/off levophed due to hypotension. Underwent rt 1st and 2nd toe amputation 9/22 by Dr. Sharol Given. PMHx: DM, smoker, HTN, HLD, right great toe gangrene   OT comments  Pt's wife now admitted to hospital with hip fx. Pt will still have 24 hour care at home. Daughter present for session. Pt and daughter educated in compensatory strategies for ADL now that pt is TDWB on R foot. Will practice use of AE next visit.  Follow Up Recommendations  No OT follow up    Equipment Recommendations  3 in 1 bedside comode;Wheelchair (measurements OT);Wheelchair cushion (measurements OT)    Recommendations for Other Services      Precautions / Restrictions Precautions Precautions: Sternal;Fall Precaution Comments: pt able to state and generalize all precautions  Required Braces or Orthoses: Other Brace/Splint Other Brace/Splint: darco shoe rt foot Restrictions Weight Bearing Restrictions: Yes RLE Weight Bearing: Touchdown weight bearing Other Position/Activity Restrictions: sternal precautions       Mobility Bed Mobility Overal bed mobility: Modified Independent                Transfers                      Balance                                   ADL Overall ADL's : Needs assistance/impaired     Grooming: Wash/dry hands;Wash/dry face;Sitting;Set up       Lower Body Bathing: Moderate assistance;Sitting/lateral leans Lower Body Bathing Details (indicate cue type and reason): instructed in use of long bath sponge and leaning side to side Upper Body Dressing : Set up;Sitting   Lower Body Dressing: Moderate assistance;Sitting/lateral leans Lower Body Dressing Details (indicate cue type and reason): instructed to dress R LE first and to lean side to side to  pull up pants, recommended elastic waist pants/shorts, pt is aware of availability of AE Toilet Transfer: Supervision/safety;Squat-pivot;BSC   Toileting- Clothing Manipulation and Hygiene: Set up;Sitting/lateral lean Toileting - Clothing Manipulation Details (indicate cue type and reason): instructed in lateral leans to manage pants and for pericare, educated pt in multiple uses of 3 in 1        General ADL Comments: Pt now with TDWB orders, goals updated. Family in room for instruction in compensatory strategies and that pt will have to function from a w/c level until WB status is increased.      Vision                     Perception     Praxis      Cognition   Behavior During Therapy: WFL for tasks assessed/performed Overall Cognitive Status: Within Functional Limits for tasks assessed                       Extremity/Trunk Assessment               Exercises     Shoulder Instructions       General Comments      Pertinent Vitals/ Pain       Pain Assessment: Faces Faces Pain Scale: Hurts little more Pain Location: R foot Pain Descriptors / Indicators: Sore;Grimacing;Guarding  Pain Intervention(s): Monitored during session;Premedicated before session;Repositioned  Home Living                                          Prior Functioning/Environment              Frequency  Min 2X/week        Progress Toward Goals  OT Goals(current goals can now be found in the care plan section)  Progress towards OT goals: Progressing toward goals  Acute Rehab OT Goals Patient Stated Goal: return to mowing the yard OT Goal Formulation: With patient/family Time For Goal Achievement: 04/03/16 Potential to Achieve Goals: Good  Plan Discharge plan remains appropriate    Co-evaluation                 End of Session     Activity Tolerance Patient tolerated treatment well   Patient Left in bed;with call bell/phone within  reach;with family/visitor present   Nurse Communication          Time: [id number] OT Time Calculation (min): 16 min  Charges: OT General Charges $OT Visit: 1 Procedure OT Treatments $Self Care/Home Management : 8-22 mins  Malka So 03/27/2016, 10:22 AM  [phone]

## 2016-03-27 NOTE — Progress Notes (Signed)
Spoke w pt. He has never used [facility] before. He states he lives w wife, da, grandchildren. Usually someone at home 24hrs per day. States last nite his wife fell and broke her hip. She is on [address] [facility]. Went over list of hhc agencies and spoke w ortho cm who review both pt's md's and rec using [facility] for [facility] for mr Yorke and they will use [facility] for wife. Ref to [facility] for [facility]pt and hhot. Pt has cane-walker and w/c. Will cont to follow as pt progresses.

## 2016-03-27 NOTE — Progress Notes (Signed)
Advanced Heart Failure Rounding Note   Subjective:    Asked by Dr Prescott Gum to follow for cardiogenic shock and marked volume overload. He is post op day 14 S/P CABG x1 and VSD repair.   Underwent R 1st and 2nd toe amp 9/23. Wound vac in place.  Coox 70.3% this am on milrinone 0.25 mcg. + norepi 1 mcg. CVP 6-7  Feels OK this morning. Transferring to chair and to bathroom without lightheadedness/dizziness or CP.  Denies SOB   Torsemide held 03/25/16 with AKI. Creatinine trending back down. 1.2->1.3 -> 1.5 -> 1.4 -> 1.3 Weight shows up 9 lbs overnight. ? Accuracy, difficult to weigh standing with wound VAC.    Objective:   Weight Range:  Vital Signs:   Temp:  [97.4 F (36.3 C)-97.9 F (36.6 C)] 97.6 F (36.4 C) (09/26 0000) Pulse Rate:  [103-111] 105 (09/26 0500) Resp:  [14-25] 18 (09/26 0500) BP: (72-114)/(28-74) 100/63 (09/26 0500) SpO2:  [94 %-100 %] 98 % (09/26 0500) Weight:  [161 lb 4.8 oz (73.2 kg)] 161 lb 4.8 oz (73.2 kg) (09/26 0600) Last BM Date: 03/23/16  Weight change: Filed Weights   03/26/16 0500 03/27/16 0500 03/27/16 0600  Weight: 153 lb (69.4 kg) 161 lb 4.8 oz (73.2 kg) 161 lb 4.8 oz (73.2 kg)    Intake/Output:   Intake/Output Summary (Last 24 hours) at 03/27/16 0815 Last data filed at 03/27/16 0800  Gross per 24 hour  Intake          1680.12 ml  Output             1000 ml  Net           680.12 ml    Physical Exam: CVP 6-7 with prominent v-waves General:   No resp difficulty. Sitting up in bed HEENT: normal Neck: supple. JVP 6 . Carotids 2+ bilat; no bruits. No thyromegaly or nodule noted.  Cor: PMI nondisplaced. RRR. No M/G/R  Lungs: CTAB, normal effort Abdomen: soft, NT, ND, no HSM. No bruits or masses. +BS  Extremities: no cyanosis, clubbing, rash, trace Edema. R foot wound vac in place Neuro: alert & orientedx3, cranial nerves grossly intact. moves all 4 extremities w/o difficulty. Affect pleasant  Telemetry: Sinus Tach 100s   Labs: Basic Metabolic Panel:  Recent Labs Lab 03/23/16 0950 03/24/16 0448 03/25/16 0327 03/26/16 0500 03/27/16 0445  NA 133* 135 129* 132* 129*  K 4.0 4.0 3.6 4.5 4.4  CL 90* 86* 84* 89* 89*  CO2 33* 35* 34* 33* 30  GLUCOSE 214* 194* 231* 184* 172*  BUN 33* 32* 38* 39* 36*  CREATININE 1.21 1.34* 1.50* 1.40* 1.30*  CALCIUM 9.2 9.7 9.0 9.4 9.2  MG 1.7  --   --   --   --     Liver Function Tests:  Recent Labs Lab 03/22/16 0449  AST 19  ALT 19  ALKPHOS 232*  BILITOT 1.0  PROT 5.8*  ALBUMIN 2.8*   No results for input(s): LIPASE, AMYLASE in the last 168 hours. No results for input(s): AMMONIA in the last 168 hours.  CBC:  Recent Labs Lab 03/21/16 0857 03/22/16 0449 03/23/16 0950 03/24/16 0448 03/25/16 0327  WBC  --  12.3* 14.8* 16.9* 16.2*  HGB 10.2* 10.4* 10.2* 10.0* 9.4*  HCT 30.0* 31.7* 30.8* 30.8* 28.4*  MCV  --  88.3 87.7 88.8 87.7  PLT  --  386 508* 469* 413*    Cardiac Enzymes: No results for input(s): CKTOTAL, CKMB,  CKMBINDEX, TROPONINI in the last 168 hours.  BNP: BNP (last 3 results) No results for input(s): BNP in the last 8760 hours.  ProBNP (last 3 results) No results for input(s): PROBNP in the last 8760 hours.    Other results:  Imaging: No results found.   Medications:     Scheduled Medications: . amiodarone  200 mg Oral BID  . aspirin EC  325 mg Oral Daily   Or  . aspirin  324 mg Per Tube Daily  . bisacodyl  10 mg Oral Daily  . ceFEPime (MAXIPIME) IV  1 g Intravenous Q12H  . digoxin  0.125 mg Oral Daily  . docusate sodium  200 mg Oral Daily  . enoxaparin (LOVENOX) injection  40 mg Subcutaneous Q24H  . insulin aspart  0-15 Units Subcutaneous TID WC  . insulin aspart  0-5 Units Subcutaneous QHS  . insulin detemir  12 Units Subcutaneous BID  . mouth rinse  15 mL Mouth Rinse BID  . mometasone-formoterol  2 puff Inhalation BID  . nicotine  14 mg Transdermal QHS  . pantoprazole  40 mg Oral Daily  . rosuvastatin  40 mg  Oral q1800  . sodium chloride flush  10-40 mL Intracatheter Q12H  . spironolactone  25 mg Oral Daily    Infusions: . sodium chloride Stopped (03/24/16 1900)  . sodium chloride 10 mL/hr at 03/26/16 2007  . lactated ringers Stopped (03/23/16 1600)  . milrinone 0.25 mcg/kg/min (03/27/16 0500)  . norepinephrine (LEVOPHED) Adult infusion 1 mcg/min (03/27/16 0500)    PRN Medications: acetaminophen **OR** acetaminophen, methocarbamol **OR** methocarbamol (ROBAXIN)  IV, metoCLOPramide **OR** metoCLOPramide (REGLAN) injection, ondansetron **OR** ondansetron (ZOFRAN) IV, oxyCODONE, sodium chloride flush, traMADol   Assessment:   1. Inferiro STEMI CAD-->S/P CABG x1 VSD Repair 2. Cardiogenic Shock 3. Ischemic R Great Toe s/p amputation 9/22 4. Hyperlipidemia 5. DM Type II 6. PAF 7. Hypoalbumin-2.3  8. AKI 9. Hypokalemia/hyponaremia   Plan/Discussion:    Post Op Day 14. S/P CABG x1 VSD repair.   Co-ox 70.3% on milrinone on low-dose norepi. Looked to be tolerated norepi wean but pressures into 70s again this am ( SBP 77 most recently) Goal SBP > 85  Continue to hold torsemide for now  Continue milrinone 0.25 mcg. Could consider switch to dobutamine if unable to wean norepi. He will likely need home inotropes for an extended period with prominent RV failure. Discussed with pt.   Continue digoxin. Continue spiro 25 mg daily. Hold off on arb with hypotension and recent AKI.   Remains in Sinus Tach, rate in 100s currently.  Continue amio 200 mg BID.  S/p R 1st and 2nd toe amputation. Wound vac in place.   Hypokalemia resolved.   Length of Stay: [place], [place] 03/27/2016, 8:15 AM  Advanced Heart Failure Team Pager [phone] (M-F; 7a - 4p)  Please contact [facility] for night-coverage after hours (4p -7a ) and weekends on [email or web address]  Patient seen and examined with Oda Kilts, PA-C. We discussed all aspects of the encounter. I agree with the assessment and  plan as stated above.   Co-ox good on milrinone and low-dose norepi but unable to wean norepi completely due to hypotension. Will add midodrine 5 tid.   Hold diuretics 1 more day.  PT limited due to non-weight bearing status   Can decrease amio to 200 daily.  Tesia Lybrand,MD 9:32 AM

## 2016-03-28 LAB — BASIC METABOLIC PANEL
Anion gap: 10 (ref 5–15)
BUN: 38 mg/dL — ABNORMAL HIGH (ref 6–20)
CO2: 28 mmol/L (ref 22–32)
Calcium: 9 mg/dL (ref 8.9–10.3)
Chloride: 90 mmol/L — ABNORMAL LOW (ref 101–111)
Creatinine, Ser: 1.5 mg/dL — ABNORMAL HIGH (ref 0.61–1.24)
GFR calc Af Amer: 55 mL/min — ABNORMAL LOW (ref >60)
GFR calc non Af Amer: 47 mL/min — ABNORMAL LOW (ref >60)
Glucose, Bld: 229 mg/dL — ABNORMAL HIGH (ref 65–99)
Potassium: 4.3 mmol/L (ref 3.5–5.1)
Sodium: 128 mmol/L — ABNORMAL LOW (ref 135–145)

## 2016-03-28 LAB — GLUCOSE, CAPILLARY
GLUCOSE-CAPILLARY: 137 mg/dL — AB (ref 65–99)
Glucose-Capillary: 157 mg/dL — ABNORMAL HIGH (ref 65–99)
Glucose-Capillary: 237 mg/dL — ABNORMAL HIGH (ref 65–99)
Glucose-Capillary: 169 mg/dL — ABNORMAL HIGH (ref 65–99)

## 2016-03-28 LAB — CARBOXYHEMOGLOBIN
Carboxyhemoglobin: 1.5 % (ref 0.5–1.5)
Carboxyhemoglobin: 1.2 % (ref 0.5–1.5)
METHEMOGLOBIN: 0.7 % (ref 0.0–1.5)
METHEMOGLOBIN: 0.8 % (ref 0.0–1.5)
O2 SAT: 53.4 %
O2 Saturation: 79 %
TOTAL HEMOGLOBIN: 10.5 g/dL — AB (ref 12.0–16.0)
Total hemoglobin: 7.1 g/dL — ABNORMAL LOW (ref 12.0–16.0)

## 2016-03-28 MED ORDER — WARFARIN SODIUM 7.5 MG PO TABS
7.5000 mg | ORAL_TABLET | Freq: Once | ORAL | Status: AC
  Administered 2016-03-28: 7.5 mg via ORAL
  Filled 2016-03-28: qty 1

## 2016-03-28 MED ORDER — WARFARIN - PHARMACIST DOSING INPATIENT
Freq: Every day | Status: DC
  Administered 2016-03-28 – 2016-04-02 (×4)

## 2016-03-28 MED ORDER — COUMADIN BOOK
Freq: Once | Status: AC
  Administered 2016-03-28: 16:00:00
  Filled 2016-03-28: qty 1

## 2016-03-28 MED ORDER — WARFARIN VIDEO: Freq: Once | Status: DC

## 2016-03-28 MED ORDER — POLYETHYLENE GLYCOL 3350 17 G PO PACK
17.0000 g | PACK | Freq: Every day | ORAL | Status: DC
  Administered 2016-03-28 – 2016-03-31 (×4): 17 g via ORAL
  Filled 2016-03-28 (×6): qty 1

## 2016-03-28 NOTE — Progress Notes (Signed)
Advanced Heart Failure Rounding Note   Subjective:    Asked by Dr Prescott Gum to follow for cardiogenic shock and marked volume overload. He is post op day 15 S/P CABG x1 and VSD repair.   Underwent R 1st and 2nd toe amp 9/23. Wound vac in place.  Coox 79.0% this am on milrinone 0.25 mcg. Off norepi. CVP 6-7  Feeling better this am.  No lightheadedness or dizziness. No SOB  Creatinine in range of 1.2 - 1.5, slightly up from yesterday. Torsemide on hold as of 03/25/16 ->  Yesterdays weight innaccurate.   Pt down 33 lbs from highest weight this admission.   Objective:   Weight Range:  Vital Signs:   Temp:  [98.2 F (36.8 C)-98.5 F (36.9 C)] 98.5 F (36.9 C) (09/27 0700) Pulse Rate:  [99-108] 99 (09/27 0822) Resp:  [15-22] 17 (09/27 0822) BP: (77-94)/(51-62) 94/59 (09/27 0822) SpO2:  [95 %-98 %] 96 % (09/27 0822) Weight:  [150 lb 6.4 oz (68.2 kg)] 150 lb 6.4 oz (68.2 kg) (09/27 0347) Last BM Date: 03/23/16  Weight change: Filed Weights   03/27/16 0500 03/27/16 0600 03/28/16 0347  Weight: 161 lb 4.8 oz (73.2 kg) 161 lb 4.8 oz (73.2 kg) 150 lb 6.4 oz (68.2 kg)    Intake/Output:   Intake/Output Summary (Last 24 hours) at 03/28/16 0825 Last data filed at 03/28/16 0600  Gross per 24 hour  Intake           874.72 ml  Output             1650 ml  Net          -775.28 ml    Physical Exam: CVP 6-7 with prominent v-waves.  General:   No resp difficulty. Sitting in bed for breakfast HEENT: Normal Neck: supple. JVP 6-7. Carotids 2+ bilat; no bruits. No thyromegaly or nodule noted.   Cor: PMI nondisplaced. RRR. No M/G/R Lungs: Clear, normal effort Abdomen: soft, NT, ND, no HSM. No bruits or masses. +BS  Extremities: no cyanosis, clubbing, rash, trace ankle edema at most. SCDs in place. R foot wound vac in place Neuro: A&O x3, cranial nerves grossly intact. moves all 4 extremities w/o difficulty. Affect pleasant  Telemetry: Reviewed personally, Sinus Tach 100s     Labs: Basic Metabolic Panel:  Recent Labs Lab 03/23/16 0950 03/24/16 0448 03/25/16 0327 03/26/16 0500 03/27/16 0445 03/28/16 0430  NA 133* 135 129* 132* 129* 128*  K 4.0 4.0 3.6 4.5 4.4 4.3  CL 90* 86* 84* 89* 89* 90*  CO2 33* 35* 34* 33* 30 28  GLUCOSE 214* 194* 231* 184* 172* 229*  BUN 33* 32* 38* 39* 36* 38*  CREATININE 1.21 1.34* 1.50* 1.40* 1.30* 1.50*  CALCIUM 9.2 9.7 9.0 9.4 9.2 9.0  MG 1.7  --   --   --   --   --     Liver Function Tests:  Recent Labs Lab 03/22/16 0449  AST 19  ALT 19  ALKPHOS 232*  BILITOT 1.0  PROT 5.8*  ALBUMIN 2.8*   No results for input(s): LIPASE, AMYLASE in the last 168 hours. No results for input(s): AMMONIA in the last 168 hours.  CBC:  Recent Labs Lab 03/21/16 0857 03/22/16 0449 03/23/16 0950 03/24/16 0448 03/25/16 0327  WBC  --  12.3* 14.8* 16.9* 16.2*  HGB 10.2* 10.4* 10.2* 10.0* 9.4*  HCT 30.0* 31.7* 30.8* 30.8* 28.4*  MCV  --  88.3 87.7 88.8 87.7  PLT  --  386 508* 469* 413*    Cardiac Enzymes: No results for input(s): CKTOTAL, CKMB, CKMBINDEX, TROPONINI in the last 168 hours.  BNP: BNP (last 3 results) No results for input(s): BNP in the last 8760 hours.  ProBNP (last 3 results) No results for input(s): PROBNP in the last 8760 hours.    Other results:  Imaging: No results found.   Medications:     Scheduled Medications: . amiodarone  200 mg Oral Daily  . aspirin EC  325 mg Oral Daily   Or  . aspirin  324 mg Per Tube Daily  . bisacodyl  10 mg Oral Daily  . ceFEPime (MAXIPIME) IV  1 g Intravenous Q12H  . digoxin  0.125 mg Oral Daily  . docusate sodium  200 mg Oral Daily  . enoxaparin (LOVENOX) injection  40 mg Subcutaneous Q24H  . insulin aspart  0-15 Units Subcutaneous TID WC  . insulin aspart  0-5 Units Subcutaneous QHS  . insulin detemir  12 Units Subcutaneous BID  . mouth rinse  15 mL Mouth Rinse BID  . midodrine  5 mg Oral TID WC  . mometasone-formoterol  2 puff Inhalation BID  .  nicotine  14 mg Transdermal QHS  . pantoprazole  40 mg Oral Daily  . rosuvastatin  40 mg Oral q1800  . sodium chloride flush  10-40 mL Intracatheter Q12H  . spironolactone  25 mg Oral Daily    Infusions: . sodium chloride Stopped (03/24/16 1900)  . sodium chloride 10 mL/hr at 03/28/16 0600  . lactated ringers Stopped (03/23/16 1600)  . milrinone 0.25 mcg/kg/min (03/28/16 0600)  . norepinephrine (LEVOPHED) Adult infusion Stopped (03/27/16 1422)    PRN Medications: acetaminophen **OR** acetaminophen, methocarbamol **OR** methocarbamol (ROBAXIN)  IV, metoCLOPramide **OR** metoCLOPramide (REGLAN) injection, ondansetron **OR** ondansetron (ZOFRAN) IV, oxyCODONE, sodium chloride flush, traMADol   Assessment:   1. Inferiro STEMI CAD-->S/P CABG x1 VSD Repair 2. Cardiogenic Shock 3. Ischemic R Great Toe s/p amputation 9/22 4. Hyperlipidemia 5. DM Type II 6. PAF 7. Hypoalbumin-2.3  8. AKI 9. Hypokalemia/hyponaremia   Plan/Discussion:    Post Op Day 15. S/P CABG x1 VSD repair.   Co-ox 79.0% on milrinone 0.25 mcg/kg/mg this am. Now on midodrine with soft pressures despite stable cardiac output.  Holding torsemide for the time being.   Will attempt to wean milrinone to 0.125 mcg/kg/min.  Recheck Coox this afternoon.   Continue digoxin. Continue spiro 25 mg daily. Hold off on arb with hypotension and recent AKI.   Remains in Sinus Tach, rate in 100s currently.  Now on Amio 200 mg daily.   S/p R 1st and 2nd toe amputation. Wound vac in place.    Length of Stay: [place], [place] 03/28/2016, 8:25 AM  Advanced Heart Failure Team Pager [phone] (M-F; 7a - 4p)  Please contact [facility] for night-coverage after hours (4p -7a ) and weekends on [email or web address]   Patient seen and examined with Oda Kilts, PA-C. We discussed all aspects of the encounter. I agree with the assessment and plan as stated above.    Volume status looks good. Co-ox 79%. BP stable off  norepi with midodrine. Will try to wean milrinone. Start warfarin per Dr. Prescott Gum.   Daisia Slomski,MD 6:12 PM

## 2016-03-28 NOTE — Progress Notes (Signed)
Inpatient Diabetes Program Recommendations  AACE/ADA: New Consensus Statement on Inpatient Glycemic Control (2015)  Target Ranges:  Prepandial:   less than 140 mg/dL      Peak postprandial:   less than 180 mg/dL (1-2 hours)      Critically ill patients:  140 - 180 mg/dL    Review of Glycemic Control Results for William [name], William [name] (MRN [id number]) as of 03/28/2016 06:47  Ref. Range 03/26/2016 21:21 03/27/2016 09:26 03/27/2016 11:57 03/27/2016 16:22 03/27/2016 21:13  Glucose-Capillary Latest Ref Range: 65 - 99 mg/dL 223 (H) 189 (H) 235 (H) 162 (H) 235 (H)   Inpatient Diabetes Program Recommendations:   Noted postprandial CBGs elevated. Please consider adding Novolog 2-3 units tid meal coverage while oral DM medication held.  Thank you, Nani Gasser. Gaynor Genco, RN, MSN, CDE Inpatient Glycemic Control Team Team Pager [phone] (8am-5pm) 03/28/2016 6:47 AM

## 2016-03-28 NOTE — Discharge Instructions (Addendum)
Regarding right foot, per Dr. Sharol Given, patient will continue with nonweightbearing on the right he may wash the foot with soap and water daily dry dressing change daily. Patient is to follow-up in the office in 1 week.  Coronary Artery Bypass Grafting, Care After Refer to this sheet in the next few weeks. These instructions provide you with information on caring for yourself after your procedure. Your health care provider may also give you more specific instructions. Your treatment has been planned according to current medical practices, but problems sometimes occur. Call your health care provider if you have any problems or questions after your procedure. WHAT TO EXPECT AFTER THE PROCEDURE Recovery from surgery will be different for everyone. Some people feel well after 3 or 4 weeks, while for others it takes longer. After your procedure, it is typical to have the following:  Nausea and a lack of appetite.   Constipation.  Weakness and fatigue.   Depression or irritability.   Pain or discomfort at your incision site. HOME CARE INSTRUCTIONS  Take medicines only as directed by your health care provider. Do not stop taking medicines or start any new medicines without first checking with your health care provider.  Take your pulse as directed by your health care provider.  Perform deep breathing as directed by your health care provider. If you were given a device called an incentive spirometer, use it to practice deep breathing several times a day. Support your chest with a pillow or your arms when you take deep breaths or cough.  Keep incision areas clean, dry, and protected. Remove or change any bandages (dressings) only as directed by your health care provider. You may have skin adhesive strips over the incision areas. Do not take the strips off. They will fall off on their own.  Check incision areas daily for any swelling, redness, or drainage.  If incisions were made in your legs, do  the following:  Avoid crossing your legs.   Avoid sitting for long periods of time. Change positions every 30 minutes.   Elevate your legs when you are sitting.  Wear compression stockings as directed by your health care provider. These stockings help keep blood clots from forming in your legs.  Take showers once your health care provider approves. Until then, only take sponge baths. Pat incisions dry. Do not rub incisions with a washcloth or towel. Do not take baths, swim, or use a hot tub until your health care provider approves.  Eat foods that are high in fiber, such as raw fruits and vegetables, whole grains, beans, and nuts. Meats should be lean cut. Avoid canned, processed, and fried foods.  Drink enough fluid to keep your urine clear or pale yellow.  Weigh yourself every day. This helps identify if you are retaining fluid that may make your heart and lungs work harder.  Rest and limit activity as directed by your health care provider. You may be instructed to:  Stop any activity at once if you have chest pain, shortness of breath, irregular heartbeats, or dizziness. Get help right away if you have any of these symptoms.  Move around frequently for short periods or take short walks as directed by your health care provider. Increase your activities gradually. You may need physical therapy or cardiac rehabilitation to help strengthen your muscles and build your endurance.  Avoid lifting, pushing, or pulling anything heavier than 10 lb (4.5 kg) for at least 6 weeks after surgery.  Do not drive until your  health care provider approves.  Ask your health care provider when you may return to work.  Ask your health care provider when you may resume sexual activity.  Keep all follow-up visits as directed by your health care provider. This is important. SEEK MEDICAL CARE IF:  You have swelling, redness, increasing pain, or drainage at the site of an incision.  You have a  fever.  You have swelling in your ankles or legs.  You have pain in your legs.   You gain 2 or more pounds (0.9 kg) a day.  You are nauseous or vomit.  You have diarrhea. SEEK IMMEDIATE MEDICAL CARE IF:  You have chest pain that goes to your jaw or arms.  You have shortness of breath.   You have a fast or irregular heartbeat.   You notice a "clicking" in your breastbone (sternum) when you move.   You have numbness or weakness in your arms or legs.  You feel dizzy or light-headed.  MAKE SURE YOU:  Understand these instructions.  Will watch your condition.  Will get help right away if you are not doing well or get worse.   This information is not intended to replace advice given to you by your health care provider. Make sure you discuss any questions you have with your health care provider.   Document Released: 01/05/2005 Document Revised: 07/09/2014 Document Reviewed: 11/25/2012 Elsevier Interactive Patient Education 2016 [place] on my medicine - Coumadin   (Warfarin)  This medication education was reviewed with me or my healthcare representative as part of my discharge preparation.  The pharmacist that spoke with me during my hospital stay was:  Deboraha Sprang, [facility]  Why was Coumadin prescribed for you? Coumadin was prescribed for you because you have a blood clot or a medical condition that can cause an increased risk of forming blood clots. Blood clots can cause serious health problems by blocking the flow of blood to the heart, lung, or brain. Coumadin can prevent harmful blood clots from forming. As a reminder your indication for Coumadin is:   Blood Clot Prevention After Heart Valve Surgery  What test will check on my response to Coumadin? While on Coumadin (warfarin) you will need to have an INR test regularly to ensure that your dose is keeping you in the desired range. The INR (international normalized ratio) number is calculated  from the result of the laboratory test called prothrombin time (PT).  If an INR APPOINTMENT HAS NOT ALREADY BEEN MADE FOR YOU please schedule an appointment to have this lab work done by your health care provider within 7 days. Your INR goal is usually a number between:  2 to 3 or your provider may give you a more narrow range like 2-2.5.  Ask your health care provider during an office visit what your goal INR is.  What  do you need to  know  About  COUMADIN? Take Coumadin (warfarin) exactly as prescribed by your healthcare provider about the same time each day.  DO NOT stop taking without talking to the doctor who prescribed the medication.  Stopping without other blood clot prevention medication to take the place of Coumadin may increase your risk of developing a new clot or stroke.  Get refills before you run out.  What do you do if you miss a dose? If you miss a dose, take it as soon as you remember on the same day then continue your regularly scheduled regimen the next  day.  Do not take two doses of Coumadin at the same time.  Important Safety Information A possible side effect of Coumadin (Warfarin) is an increased risk of bleeding. You should call your healthcare provider right away if you experience any of the following: ? Bleeding from an injury or your nose that does not stop. ? Unusual colored urine (red or dark brown) or unusual colored stools (red or black). ? Unusual bruising for unknown reasons. ? A serious fall or if you hit your head (even if there is no bleeding).  Some foods or medicines interact with Coumadin (warfarin) and might alter your response to warfarin. To help avoid this: ? Eat a balanced diet, maintaining a consistent amount of Vitamin K. ? Notify your provider about major diet changes you plan to make. ? Avoid alcohol or limit your intake to 1 drink for women and 2 drinks for men per day. (1 drink is 5 oz. wine, 12 oz. beer, or 1.5 oz. liquor.)  Make sure that  ANY health care provider who prescribes medication for you knows that you are taking Coumadin (warfarin).  Also make sure the healthcare provider who is monitoring your Coumadin knows when you have started a new medication including herbals and non-prescription products.  Coumadin (Warfarin)  Major Drug Interactions  Increased Warfarin Effect Decreased Warfarin Effect  Alcohol (large quantities) Antibiotics (esp. Septra/Bactrim, Flagyl, Cipro) Amiodarone (Cordarone) Aspirin (ASA) Cimetidine (Tagamet) Megestrol (Megace) NSAIDs (ibuprofen, naproxen, etc.) Piroxicam (Feldene) Propafenone (Rythmol SR) Propranolol (Inderal) Isoniazid (INH) Posaconazole (Noxafil) Barbiturates (Phenobarbital) Carbamazepine (Tegretol) Chlordiazepoxide (Librium) Cholestyramine (Questran) Griseofulvin Oral Contraceptives Rifampin Sucralfate (Carafate) Vitamin K   Coumadin (Warfarin) Major Herbal Interactions  Increased Warfarin Effect Decreased Warfarin Effect  Garlic Ginseng Ginkgo biloba Coenzyme Q10 Green tea St. Johns wort    Coumadin (Warfarin) FOOD Interactions  Eat a consistent number of servings per week of foods HIGH in Vitamin K (1 serving =  cup)  Collards (cooked, or boiled & drained) Kale (cooked, or boiled & drained) Mustard greens (cooked, or boiled & drained) Parsley *serving size only =  cup Spinach (cooked, or boiled & drained) Swiss chard (cooked, or boiled & drained) Turnip greens (cooked, or boiled & drained)  Eat a consistent number of servings per week of foods MEDIUM-HIGH in Vitamin K (1 serving = 1 cup)  Asparagus (cooked, or boiled & drained) Broccoli (cooked, boiled & drained, or raw & chopped) Brussel sprouts (cooked, or boiled & drained) *serving size only =  cup Lettuce, raw (green leaf, endive, romaine) Spinach, raw Turnip greens, raw & chopped   These websites have more information on Coumadin (warfarin):   FailFactory.se; VeganReport.com.au;

## 2016-03-28 NOTE — Progress Notes (Signed)
ANTICOAGULATION CONSULT NOTE - Initial Consult  Pharmacy Consult for Coumadin Indication: prosthetic material used to repair post-MI VSD  Allergies  Allergen Reactions  . Morphine And Related Other (See Comments)    UNSPECIFIED REACTION "Pt said it was too much"     Patient Measurements: Height: 5\' 5"  (165.1 cm) Weight: 150 lb 6.4 oz (68.2 kg) IBW/kg (Calculated) : 61.5  Vital Signs: Temp: 98.5 F (36.9 C) (09/27 0700) Temp Source: Oral (09/27 0700) BP: 101/59 (09/27 0900) Pulse Rate: 102 (09/27 0900)  Labs:  Recent Labs  03/26/16 0500 03/27/16 0445 03/28/16 0430  CREATININE 1.40* 1.30* 1.50*    Estimated Creatinine Clearance: 43.3 mL/min (by C-G formula based on SCr of 1.5 mg/dL (H)).   Medical History: Past Medical History:  Diagnosis Date  . Diabetes mellitus without complication ([place])   . Hyperlipidemia   . Hypertension    Assessment: 64yom s/p STEMI and repair of post-MI VSD to begin coumadin. Coumadin score = 5. Baseline INR from 9/14 = 1.33. LFTs wnl. He is on po amiodarone so will need to watch for drug interaction.   Goal of Therapy:  INR 2-3 Monitor platelets by anticoagulation protocol: Yes   Plan:  1) Coumadin 7.5mg  x 1 2) Daily INR 3) Continue lovenox 40 sq q24 until INR therapeutic 4) Will provide video, book, education  Deboraha Sprang 03/28/2016,11:22 AM

## 2016-03-28 NOTE — Progress Notes (Addendum)
[address]       [place] [address]             [phone]      5 Days Post-Op Procedure(s) (LRB): 1st and 2nd Ray Amputation Right Foot (Right) Subjective: Feeling pretty well overall   Objective: Vital signs in last 24 hours: Temp:  [98.2 F (36.8 C)-98.5 F (36.9 C)] 98.5 F (36.9 C) (09/27 0700) Pulse Rate:  [99-108] 99 (09/27 0822) Cardiac Rhythm: Sinus tachycardia (09/26 2000) Resp:  [15-22] 17 (09/27 0822) BP: (77-94)/(51-62) 94/59 (09/27 0822) SpO2:  [95 %-98 %] 96 % (09/27 0822) Weight:  [150 lb 6.4 oz (68.2 kg)] 150 lb 6.4 oz (68.2 kg) (09/27 0347)  Hemodynamic parameters for last 24 hours: CVP:  [6 mmHg-8 mmHg] 8 mmHg  Intake/Output from previous day: 09/26 0701 - 09/27 0700 In: 1174.7 [P.O.:540; I.V.:484.7; IV Piggyback:150] Out: 1650 [Urine:1650] Intake/Output this shift: No intake/output data recorded.  General appearance: alert, cooperative and no distress Heart: regular rate and rhythm Lungs: clear to auscultation bilaterally Abdomen: benign Extremities: no edema Wound: healing well, VAC in place  Lab Results: No results for input(s): WBC, HGB, HCT, PLT in the last 72 hours. BMET:  Recent Labs  03/27/16 0445 03/28/16 0430  NA 129* 128*  K 4.4 4.3  CL 89* 90*  CO2 30 28  GLUCOSE 172* 229*  BUN 36* 38*  CREATININE 1.30* 1.50*  CALCIUM 9.2 9.0    PT/INR: No results for input(s): LABPROT, INR in the last 72 hours. ABG    Component Value Date/Time   PHART 7.427 03/16/2016 0420   HCO3 25.3 03/16/2016 0420   TCO2 28 03/21/2016 0857   ACIDBASEDEF 2.0 03/14/2016 1715   O2SAT 79.0 03/28/2016 0450   CBG (last 3)   Recent Labs  03/27/16 1622 03/27/16 2113 03/28/16 0809  GLUCAP 162* 235* 157*    Meds Scheduled Meds: . amiodarone  200 mg Oral Daily  . aspirin EC  325 mg Oral Daily   Or  . aspirin  324 mg Per Tube Daily  . bisacodyl  10 mg Oral Daily  . digoxin  0.125 mg Oral Daily  . docusate sodium   200 mg Oral Daily  . enoxaparin (LOVENOX) injection  40 mg Subcutaneous Q24H  . insulin aspart  0-15 Units Subcutaneous TID WC  . insulin aspart  0-5 Units Subcutaneous QHS  . insulin detemir  12 Units Subcutaneous BID  . mouth rinse  15 mL Mouth Rinse BID  . midodrine  5 mg Oral TID WC  . mometasone-formoterol  2 puff Inhalation BID  . nicotine  14 mg Transdermal QHS  . pantoprazole  40 mg Oral Daily  . rosuvastatin  40 mg Oral q1800  . sodium chloride flush  10-40 mL Intracatheter Q12H  . spironolactone  25 mg Oral Daily   Continuous Infusions: . sodium chloride Stopped (03/24/16 1900)  . sodium chloride 10 mL/hr at 03/28/16 0600  . lactated ringers Stopped (03/23/16 1600)  . milrinone 0.25 mcg/kg/min (03/28/16 0600)  . norepinephrine (LEVOPHED) Adult infusion Stopped (03/27/16 1422)   PRN Meds:.acetaminophen **OR** acetaminophen, methocarbamol **OR** methocarbamol (ROBAXIN)  IV, metoCLOPramide **OR** metoCLOPramide (REGLAN) injection, ondansetron **OR** ondansetron (ZOFRAN) IV, oxyCODONE, sodium chloride flush, traMADol  Xrays No results found.  Assessment/Plan: S/P Procedure(s) (LRB): 1st and 2nd Ray Amputation Right Foot (Right)   hemodynamics improving - AHF team managing Cont rehab- working with PT Right foot management as per [name]  LOS: 15 days    GOLD,WAYNE E 03/28/2016  Patient now making significant progress. Because he'll not need further foot surgery will start Coumadin for significant amount of prosthetic material inside the interventricular septum. Goal INR 1. 8-2 .2

## 2016-03-28 NOTE — Progress Notes (Signed)
Physical Therapy Treatment Patient Details Name: Tiara Topper MRN: [id number] DOB: 1950-12-13 Today's Date: 03/28/2016    History of Present Illness 65 yo s/p acute MI with CABGx1 and septal defect repair. On/off levophed due to hypotension. Underwent rt 1st and 2nd toe amputation 9/22 by Dr. Sharol Given. PMHx: DM, smoker, HTN, HLD, right great toe gangrene    PT Comments    Pt admitted with above diagnosis. Pt currently with functional limitations due to balance and endurance deficits. Pt was able to transfer to chair following precautions.  REviewed precautions with daughter as well. Pt continues to perform exercises as well to incr strength.  Pt will benefit from skilled PT to increase their independence and safety with mobility to allow discharge to the venue listed below.    Follow Up Recommendations  Home health PT;Supervision for mobility/OOB (HHOT to address bath equipment)     Equipment Recommendations  Wheelchair (measurements PT);Wheelchair cushion (measurements PT) (16x18 lightweight, anti tippers, desk arms, foot rests)    Recommendations for Other Services       Precautions / Restrictions Precautions Precautions: Sternal;Fall Precaution Comments: pt able to state and generalize all precautions  Required Braces or Orthoses: Other Brace/Splint Other Brace/Splint: darco shoe rt foot Restrictions Weight Bearing Restrictions: Yes RLE Weight Bearing: Touchdown weight bearing Other Position/Activity Restrictions: sternal precautions    Mobility  Bed Mobility Overal bed mobility: Modified Independent                Transfers Overall transfer level: Needs assistance Equipment used: None Transfers: Sit to/from [facility] Sit to Stand: Supervision Stand pivot transfers: Min guard       General transfer comment: supervision for lines, pt able to stand from bed maintaining precautions and pivot to  chair with min cues and no physical  assist  Ambulation/Gait                 Stairs            Wheelchair Mobility    Modified Rankin (Stroke Patients Only)       Balance Overall balance assessment: Needs assistance Sitting-balance support: No upper extremity supported;Feet supported Sitting balance-Leahy Scale: Good     Standing balance support: Bilateral upper extremity supported;During functional activity Standing balance-Leahy Scale: Fair Standing balance comment: stands statically with min weight on one UE with fair balaance.                    Cognition Arousal/Alertness: Awake/alert Behavior During Therapy: WFL for tasks assessed/performed Overall Cognitive Status: Within Functional Limits for tasks assessed                      Exercises General Exercises - Upper Extremity Shoulder Flexion: AROM;Both;5 reps;Seated (to 90 degrees only) Elbow Flexion: AROM;Both;20 reps;Seated General Exercises - Lower Extremity Ankle Circles/Pumps: AROM;20 reps;Both;Seated Long Arc Quad: AROM;Both;20 reps;Seated Hip ABduction/ADduction: AROM;Both;20 reps;Seated Hip Flexion/Marching: AROM;Both;20 reps;Seated    General Comments General comments (skin integrity, edema, etc.): Daughter came in mid session and reviewed pts sternal precautions as well as his right foot precautions.  Daughter verbalizes understanding.       Pertinent Vitals/Pain Pain Assessment: No/denies pain  VSS    Home Living                      Prior Function            PT Goals (current goals can now be found in the care  plan section) Progress towards PT goals: Progressing toward goals    Frequency    Min 3X/week      PT Plan Current plan remains appropriate    Co-evaluation             End of Session Equipment Utilized During Treatment: Gait belt Activity Tolerance: Patient tolerated treatment well Patient left: in chair;with call bell/phone within reach;with family/visitor  present     Time: [id number] PT Time Calculation (min) (ACUTE ONLY): 10 min  Charges:  $Therapeutic Activity: 8-22 mins                    G CodesDenice Paradise 03-29-2016, 1:27 PM Gunnison Chahal,PT Acute Rehabilitation [phone] (pager)

## 2016-03-29 LAB — CARBOXYHEMOGLOBIN
CARBOXYHEMOGLOBIN: 1.4 % (ref 0.5–1.5)
Methemoglobin: 0.8 % (ref 0.0–1.5)
O2 Saturation: 56 %
Total hemoglobin: 9.1 g/dL — ABNORMAL LOW (ref 12.0–16.0)

## 2016-03-29 LAB — BASIC METABOLIC PANEL
Anion gap: 6 (ref 5–15)
BUN: 36 mg/dL — ABNORMAL HIGH (ref 6–20)
CO2: 28 mmol/L (ref 22–32)
Calcium: 9.1 mg/dL (ref 8.9–10.3)
Chloride: 97 mmol/L — ABNORMAL LOW (ref 101–111)
Creatinine, Ser: 1.3 mg/dL — ABNORMAL HIGH (ref 0.61–1.24)
GFR calc Af Amer: >60 mL/min (ref >60)
GFR calc non Af Amer: 56 mL/min — ABNORMAL LOW (ref >60)
Glucose, Bld: 145 mg/dL — ABNORMAL HIGH (ref 65–99)
Potassium: 4.6 mmol/L (ref 3.5–5.1)
Sodium: 131 mmol/L — ABNORMAL LOW (ref 135–145)

## 2016-03-29 LAB — GLUCOSE, CAPILLARY
GLUCOSE-CAPILLARY: 124 mg/dL — AB (ref 65–99)
GLUCOSE-CAPILLARY: 239 mg/dL — AB (ref 65–99)
GLUCOSE-CAPILLARY: 211 mg/dL — AB (ref 65–99)
GLUCOSE-CAPILLARY: 159 mg/dL — AB (ref 65–99)

## 2016-03-29 LAB — PROTIME-INR
INR: 1.15
Prothrombin Time: 14.7 seconds (ref 11.4–15.2)

## 2016-03-29 MED ORDER — WARFARIN SODIUM 7.5 MG PO TABS: 7.5000 mg | ORAL_TABLET | Freq: Once | ORAL | Status: DC

## 2016-03-29 MED ORDER — WARFARIN SODIUM 7.5 MG PO TABS
7.5000 mg | ORAL_TABLET | Freq: Once | ORAL | Status: AC
  Administered 2016-03-29: 7.5 mg via ORAL
  Filled 2016-03-29: qty 1

## 2016-03-29 NOTE — Progress Notes (Addendum)
Physical Therapy Treatment Patient Details Name: William [name] MRN: [id number] DOB: [birth year] Today's Date: 03/29/2016    History of Present Illness 65 yo s/p acute MI with CABGx1 and septal defect repair. On/off levophed due to hypotension. Underwent rt 1st and 2nd toe amputation 9/22 by Dr. Sharol Given. PMHx: DM, smoker, HTN, HLD, right great toe gangrene    PT Comments    Patient moving impulsively prior to equipment fully set/prepared for bed to chair transfer. Pt with posterior loss of balance during pivot and descended quickly into chair, using his LUE on armrest to brace himself. Reviewed importance of slowing down and working with his caregiver to perform a safe transfer.   Follow Up Recommendations  Home health PT;Supervision for mobility/OOB (HHOT to address bath equipment)     Equipment Recommendations  None recommended by PT (Pt reports he has a wheelchair. )    Recommendations for Other Services       Precautions / Restrictions Precautions Precautions: Sternal;Fall Precaution Comments: pt able to state and generalize all precautions  Required Braces or Orthoses: Other Brace/Splint Other Brace/Splint: darco shoe rt foot Restrictions Weight Bearing Restrictions: Yes RLE Weight Bearing: Touchdown weight bearing Other Position/Activity Restrictions: sternal precautions    Mobility  Bed Mobility Overal bed mobility: Modified Independent                Transfers Overall transfer level: Needs assistance Equipment used: None   Sit to Stand: Supervision Stand pivot transfers: Min assist       General transfer comment: pt uses LUE light touch on bedrail and then reaching across with LUE to far armrest as he pivots; pt with posterior loss of balance and quick descent into chair (using LUE on armrest more than he should); denied incr pain  Ambulation/Gait                 Stairs            Wheelchair Mobility    Modified Rankin (Stroke Patients  Only)       Balance     Sitting balance-Leahy Scale: Good       Standing balance-Leahy Scale: Fair                      Cognition Arousal/Alertness: Awake/alert Behavior During Therapy: Impulsive Overall Cognitive Status: Within Functional Limits for tasks assessed                      Exercises General Exercises - Lower Extremity Ankle Circles/Pumps: AROM;Both;10 reps Straight Leg Raises: Right;AROM;10 reps Other Exercises Other Exercises: Performed single leg bridging with 5 sec hold with LLE; vc and tactile cues for proper technique; vc for breathing Other Exercises: Rt hip extension "bridging" with large blanket roll placed under distal thigh/knee; vc and tactile cues for technique (pushing down onto roll to lift/extend Rt hip) x 10    General Comments        Pertinent Vitals/Pain Pain Assessment: Faces Faces Pain Scale: No hurt    Home Living                      Prior Function            PT Goals (current goals can now be found in the care plan section) Acute Rehab PT Goals Patient Stated Goal: return to mowing the yard PT Goal Formulation: With patient Time For Goal Achievement: 04/05/16 Potential to Achieve Goals: Good Additional Goals  Additional Goal #1: Family will demonstrate (or pt can verbally instruct them) in ascending steps with wheelchair.  Progress towards PT goals: Goals met and updated - see care plan (goals that remained approp with TDWB met)    Frequency    Min 3X/week      PT Plan Current plan remains appropriate    Co-evaluation             End of Session Equipment Utilized During Treatment: Other (comment) (pt moved impulsively despite cues to wait to don belt) Activity Tolerance: Patient tolerated treatment well Patient left: in chair;with call bell/phone within reach;with family/visitor present     Time: 2241-1464 PT Time Calculation (min) (ACUTE ONLY): 28 min  Charges:  $Therapeutic  Exercise: 8-22 mins $Therapeutic Activity: 8-22 mins                    G Codes:      William [name] Apr 08, 2016, 9:44 AM Pager [phone]

## 2016-03-29 NOTE — Progress Notes (Signed)
Occupational Therapy Treatment Patient Details Name: William [name] MRN: [id number] DOB: 1950/11/04 Today's Date: 03/29/2016    History of present illness 65 yo s/p acute MI with CABGx1 and septal defect repair. On/off levophed due to hypotension. Underwent rt 1st and 2nd toe amputation 9/22 by Dr. Sharol Given. PMHx: DM, smoker, HTN, HLD, right great toe gangrene   OT comments  Issued and instructed pt in use of AE for LB bathing and dressing. Pt now agreeable to using AE since his wife is no long able to assist him and he would prefer not to rely on his daughter. Added tub transfer bench to DME as pt continues to be TDWB on R foot.  Follow Up Recommendations  No OT follow up    Equipment Recommendations  3 in 1 bedside comode;Tub/shower bench    Recommendations for Other Services      Precautions / Restrictions Precautions Precautions: Sternal;Fall Precaution Comments: pt able to state and generalize all precautions  Required Braces or Orthoses: Other Brace/Splint Other Brace/Splint: darco shoe rt foot Restrictions Weight Bearing Restrictions: Yes RLE Weight Bearing: Touchdown weight bearing Other Position/Activity Restrictions: sternal precautions       Mobility Bed Mobility Overal bed mobility: Modified Independent             General bed mobility comments: with HOB 20degrees  Transfers     Balance     Sitting balance-Leahy Scale: Good       Standing balance-Leahy Scale: Fair                     ADL Overall ADL's : Needs assistance/impaired               Lower Body Bathing Details (indicate cue type and reason): issued long handled bath sponge and instructed in use for back and L foot     Lower Body Dressing: Sitting/lateral leans;Minimal assistance Lower Body Dressing Details (indicate cue type and reason): educated in use of reacher, sock aide and long shoe horn and issued, instructed in compensatory strategies and leaning side to side to pull up  pants               General ADL Comments: Pt grateful for AE, placed in closet with his name on it.      Vision                     Perception     Praxis      Cognition   Behavior During Therapy: Impulsive Overall Cognitive Status: Within Functional Limits for tasks assessed                       Extremity/Trunk Assessment               Exercises    Shoulder Instructions       General Comments      Pertinent Vitals/ Pain       Pain Assessment: No/denies pain Faces Pain Scale: No hurt  Home Living                                          Prior Functioning/Environment              Frequency  Min 2X/week        Progress Toward Goals  OT Goals(current goals can now be found  in the care plan section)  Progress towards OT goals: Progressing toward goals  Acute Rehab OT Goals Patient Stated Goal: return to mowing the yard Time For Goal Achievement: 04/03/16 Potential to Achieve Goals: Good ADL Goals Additional ADL Goal #2: Pt will perform LB bathing and dressing mod I with AE.  Plan Discharge plan remains appropriate    Co-evaluation                 End of Session     Activity Tolerance Patient tolerated treatment well   Patient Left in bed;with call bell/phone within reach   Nurse Communication          Time: 1012-1025 OT Time Calculation (min): 13 min  Charges: OT General Charges $OT Visit: 1 Procedure OT Treatments $Self Care/Home Management : 8-22 mins  Malka So 03/29/2016, 10:34 AM  [phone]

## 2016-03-29 NOTE — Progress Notes (Signed)
[facility] DAILY ICU PROGRESS NOTE                   [address]            [place] [address]          [phone]   6 Days Post-Op Procedure(s) (LRB): 1st and 2nd Ray Amputation Right Foot (Right)  Total Length of Stay:  LOS: 16 days   Subjective: Feeling better each day  Objective: Vital signs in last 24 hours: Temp:  [97.7 F (36.5 C)-98.7 F (37.1 C)] 98 F (36.7 C) (09/28 0803) Cardiac Rhythm: Normal sinus rhythm (09/28 0803) Resp:  [15-21] 19 (09/28 0803) BP: (88-110)/(59-67) 88/62 (09/28 0803) SpO2:  [92 %-97 %] 97 % (09/28 0803) Weight:  [150 lb 6.4 oz (68.2 kg)] 150 lb 6.4 oz (68.2 kg) (09/28 0300)  Filed Weights   03/27/16 0600 03/28/16 0347 03/29/16 0300  Weight: 161 lb 4.8 oz (73.2 kg) 150 lb 6.4 oz (68.2 kg) 150 lb 6.4 oz (68.2 kg)    Weight change: 0 lb (0 kg)   Hemodynamic parameters for last 24 hours: CVP:  [5 mmHg-7 mmHg] 7 mmHg  Intake/Output from previous day: 09/27 0701 - 09/28 0700 In: 441.8 [P.O.:120; I.V.:321.8] Out: 2575 [Urine:2575]  Intake/Output this shift: Total I/O In: 12.8 [I.V.:12.8] Out: -   Current Meds: Scheduled Meds: . amiodarone  200 mg Oral Daily  . aspirin EC  325 mg Oral Daily   Or  . aspirin  324 mg Per Tube Daily  . bisacodyl  10 mg Oral Daily  . digoxin  0.125 mg Oral Daily  . docusate sodium  200 mg Oral Daily  . enoxaparin (LOVENOX) injection  40 mg Subcutaneous Q24H  . insulin aspart  0-15 Units Subcutaneous TID WC  . insulin aspart  0-5 Units Subcutaneous QHS  . insulin detemir  12 Units Subcutaneous BID  . mouth rinse  15 mL Mouth Rinse BID  . midodrine  5 mg Oral TID WC  . mometasone-formoterol  2 puff Inhalation BID  . nicotine  14 mg Transdermal QHS  . pantoprazole  40 mg Oral Daily  . polyethylene glycol  17 g Oral Daily  . rosuvastatin  40 mg Oral q1800  . sodium chloride flush  10-40 mL Intracatheter Q12H  . spironolactone  25 mg Oral Daily  . warfarin  7.5 mg Oral ONCE-1800  .  warfarin   Does not apply Once  . Warfarin - Pharmacist Dosing Inpatient   Does not apply q1800   Continuous Infusions: . sodium chloride Stopped (03/24/16 1900)  . sodium chloride 10 mL/hr at 03/28/16 0600  . lactated ringers Stopped (03/23/16 1600)  . milrinone 0.125 mcg/kg/min (03/28/16 1140)  . norepinephrine (LEVOPHED) Adult infusion Stopped (03/27/16 1422)   PRN Meds:.acetaminophen **OR** acetaminophen, methocarbamol **OR** methocarbamol (ROBAXIN)  IV, metoCLOPramide **OR** metoCLOPramide (REGLAN) injection, ondansetron **OR** ondansetron (ZOFRAN) IV, oxyCODONE, sodium chloride flush, traMADol  General appearance: alert, cooperative and no distress Heart: regular rate and rhythm Lungs: min dim in bases Abdomen: benign Extremities: no edema Wound: incis healing well  Lab Results: CBC:No results for input(s): WBC, HGB, HCT, PLT in the last 72 hours. BMET:  Recent Labs  03/28/16 0430 03/29/16 0410  NA 128* 131*  K 4.3 4.6  CL 90* 97*  CO2 28 28  GLUCOSE 229* 145*  BUN 38* 36*  CREATININE 1.50* 1.30*  CALCIUM 9.0 9.1    CMET: Lab Results  Component Value Date  WBC 16.2 (H) 03/25/2016   HGB 9.4 (L) 03/25/2016   HCT 28.4 (L) 03/25/2016   PLT 413 (H) 03/25/2016   GLUCOSE 145 (H) 03/29/2016   CHOL 96 03/13/2016   TRIG 146 03/13/2016   HDL 23 (L) 03/13/2016   LDLCALC 44 03/13/2016   ALT 19 03/22/2016   AST 19 03/22/2016   NA 131 (L) 03/29/2016   K 4.6 03/29/2016   CL 97 (L) 03/29/2016   CREATININE 1.30 (H) 03/29/2016   BUN 36 (H) 03/29/2016   CO2 28 03/29/2016   TSH 1.885 07/10/2011   INR 1.15 03/29/2016   HGBA1C 10.7 (H) 03/13/2016    PT/INR:  Recent Labs  03/29/16 0410  LABPROT 14.7  INR 1.15   Radiology: No results found.   Assessment/Plan: S/P Procedure(s) (LRB): 1st and 2nd Ray Amputation Right Foot (Right)  1 BP runs relatively low, AHF team managing , hopefully off milrinone soon 2 creat improved 3 sugars fairly well nontrolled 4 on  coumadin now   Athina Fahey E 03/29/2016 9:24 AM

## 2016-03-29 NOTE — Progress Notes (Signed)
ANTICOAGULATION CONSULT NOTE - Follow Up Consult  Pharmacy Consult for Coumadin Indication: prosthetic material used to repair post-MI VSD  Allergies  Allergen Reactions  . Morphine And Related Other (See Comments)    UNSPECIFIED REACTION "Pt said it was too much"     Patient Measurements: Height: 5\' 5"  (165.1 cm) Weight: 150 lb 6.4 oz (68.2 kg) IBW/kg (Calculated) : 61.5  Vital Signs: Temp: 98 F (36.7 C) (09/28 0803) Temp Source: Oral (09/28 0803) BP: 88/62 (09/28 0803)  Labs:  Recent Labs  03/27/16 0445 03/28/16 0430 03/29/16 0410  LABPROT  --   --  14.7  INR  --   --  1.15  CREATININE 1.30* 1.50* 1.30*    Estimated Creatinine Clearance: 49.9 mL/min (by C-G formula based on SCr of 1.3 mg/dL (H)).  Assessment: 64yom s/p STEMI and repair of post-MI VSD was started on coumadin yesterday. INR 1.15 after first dose. Continues on po amiodarone. No bleeding.  Goal of Therapy:  INR 2-3 Monitor platelets by anticoagulation protocol: Yes   Plan:  1) Repeat coumadin 7.5mg  x 1 2) Daily INR 3) Continue lovenox 40 sq q24 until INR therapeutic   Deboraha Sprang 03/29/2016,8:14 AM

## 2016-03-29 NOTE — Progress Notes (Signed)
Advanced Heart Failure Rounding Note   Subjective:    Asked by Dr Prescott Gum to follow for cardiogenic shock and marked volume overload. He is S/P CABG x1 and VSD repair 03/13/16  Underwent R 1st and 2nd toe amp 9/23. Wound vac in place.  Coox 56.0% this am on milrinone 0.125 mcg. Off norepi. CVP 6-7  Feeling good today. Denies DOE, lightheadedness, or dizziness.   Weight stable. Creatinine stable 1.3-1.5 Torsemide on hold as of 03/25/16 ->  Yesterdays weight innaccurate.   Pt down 33 lbs from highest weight this admission.   Objective:   Weight Range:  Vital Signs:   Temp:  [97.7 F (36.5 C)-98.7 F (37.1 C)] 98 F (36.7 C) (09/28 0803) Pulse Rate:  [99-102] 102 (09/27 0900) Resp:  [15-21] 19 (09/28 0803) BP: (88-110)/(59-67) 88/62 (09/28 0803) SpO2:  [92 %-97 %] 97 % (09/28 0803) Weight:  [150 lb 6.4 oz (68.2 kg)] 150 lb 6.4 oz (68.2 kg) (09/28 0300) Last BM Date: 03/23/16  Weight change: Filed Weights   03/27/16 0600 03/28/16 0347 03/29/16 0300  Weight: 161 lb 4.8 oz (73.2 kg) 150 lb 6.4 oz (68.2 kg) 150 lb 6.4 oz (68.2 kg)    Intake/Output:   Intake/Output Summary (Last 24 hours) at 03/29/16 0807 Last data filed at 03/29/16 0500  Gross per 24 hour  Intake           441.78 ml  Output             2575 ml  Net         -2133.22 ml    Physical Exam: CVP stable 6-7 with prominent v-waves.  General:  Sitting in chair HEENT: Normal Neck: supple. JVP 6-7. Carotids 2+ bilat; no bruits. No thyromegaly or nodule noted.   Cor: PMI nondisplaced. RRR. No M/G/R Lungs: CTAB, normal effort Abdomen: soft, NT, ND, no HSM. No bruits or masses. +BS  Extremities: no cyanosis, clubbing, rash, Minimal ankle edema. SCDs in place. R foot wound vac in place Neuro: A&O x3, cranial nerves grossly intact. moves all 4 extremities w/o difficulty. Affect pleasant  Telemetry: Reviewed personally, Sinus tach 100s.     Labs: Basic Metabolic Panel:  Recent Labs Lab 03/23/16 0950   03/25/16 0327 03/26/16 0500 03/27/16 0445 03/28/16 0430 03/29/16 0410  NA 133*  < > 129* 132* 129* 128* 131*  K 4.0  < > 3.6 4.5 4.4 4.3 4.6  CL 90*  < > 84* 89* 89* 90* 97*  CO2 33*  < > 34* 33* 30 28 28   GLUCOSE 214*  < > 231* 184* 172* 229* 145*  BUN 33*  < > 38* 39* 36* 38* 36*  CREATININE 1.21  < > 1.50* 1.40* 1.30* 1.50* 1.30*  CALCIUM 9.2  < > 9.0 9.4 9.2 9.0 9.1  MG 1.7  --   --   --   --   --   --   < > = values in this interval not displayed.  Liver Function Tests: No results for input(s): AST, ALT, ALKPHOS, BILITOT, PROT, ALBUMIN in the last 168 hours. No results for input(s): LIPASE, AMYLASE in the last 168 hours. No results for input(s): AMMONIA in the last 168 hours.  CBC:  Recent Labs Lab 03/23/16 0950 03/24/16 0448 03/25/16 0327  WBC 14.8* 16.9* 16.2*  HGB 10.2* 10.0* 9.4*  HCT 30.8* 30.8* 28.4*  MCV 87.7 88.8 87.7  PLT 508* 469* 413*    Cardiac Enzymes: No results for  input(s): CKTOTAL, CKMB, CKMBINDEX, TROPONINI in the last 168 hours.  BNP: BNP (last 3 results) No results for input(s): BNP in the last 8760 hours.  ProBNP (last 3 results) No results for input(s): PROBNP in the last 8760 hours.    Other results:  Imaging: No results found.   Medications:     Scheduled Medications: . amiodarone  200 mg Oral Daily  . aspirin EC  325 mg Oral Daily   Or  . aspirin  324 mg Per Tube Daily  . bisacodyl  10 mg Oral Daily  . digoxin  0.125 mg Oral Daily  . docusate sodium  200 mg Oral Daily  . enoxaparin (LOVENOX) injection  40 mg Subcutaneous Q24H  . insulin aspart  0-15 Units Subcutaneous TID WC  . insulin aspart  0-5 Units Subcutaneous QHS  . insulin detemir  12 Units Subcutaneous BID  . mouth rinse  15 mL Mouth Rinse BID  . midodrine  5 mg Oral TID WC  . mometasone-formoterol  2 puff Inhalation BID  . nicotine  14 mg Transdermal QHS  . pantoprazole  40 mg Oral Daily  . polyethylene glycol  17 g Oral Daily  . rosuvastatin  40 mg  Oral q1800  . sodium chloride flush  10-40 mL Intracatheter Q12H  . spironolactone  25 mg Oral Daily  . warfarin   Does not apply Once  . Warfarin - Pharmacist Dosing Inpatient   Does not apply q1800    Infusions: . sodium chloride Stopped (03/24/16 1900)  . sodium chloride 10 mL/hr at 03/28/16 0600  . lactated ringers Stopped (03/23/16 1600)  . milrinone 0.125 mcg/kg/min (03/28/16 1140)  . norepinephrine (LEVOPHED) Adult infusion Stopped (03/27/16 1422)    PRN Medications: acetaminophen **OR** acetaminophen, methocarbamol **OR** methocarbamol (ROBAXIN)  IV, metoCLOPramide **OR** metoCLOPramide (REGLAN) injection, ondansetron **OR** ondansetron (ZOFRAN) IV, oxyCODONE, sodium chloride flush, traMADol   Assessment:   1. Inferiro STEMI CAD-->S/P CABG x1 VSD Repair 2. Cardiogenic Shock 3. Ischemic R Great Toe s/p amputation 9/22 4. Hyperlipidemia 5. DM Type II 6. PAF 7. Hypoalbumin-2.3  8. AKI 9. Hypokalemia/hyponaremia   Plan/Discussion:    S/P CABG x1 VSD repair 03/13/16  Co-ox 56.0% on milrinone 0.125 mcg/kg/mg this am. Now on midodrine with soft pressures despite stable cardiac output.  Torsemide on hold. Will discuss with MD.  May try and wean full to see how he tolerates vs following coox in am.   Continue digoxin. Continue spiro 25 mg daily. Hold off on arb with hypotension and recent AKI.   No further AF. Continue Amio 200 mg daily. Loading coumadin.   S/p R 1st and 2nd toe amputation. Wound vac in place. Will not need further surgery per primary.  Length of Stay: [place], [place] 03/29/2016, 8:07 AM  Advanced Heart Failure Team Pager [phone] (M-F; 7a - 4p)  Please contact [facility] for night-coverage after hours (4p -7a ) and weekends on [email or web address]  Patient seen and examined with Oda Kilts, PA-C. We discussed all aspects of the encounter. I agree with the assessment and plan as stated above.   BP remains soft. Tolerating milrinone  wean. Volume status looks good. Loading coumadin. Will continue current therapy. Try to wean milrinone tomorrow if co-ox stable. Can increase midodrine as needed.   Ziyanna Tolin,MD 5:44 PM

## 2016-03-30 LAB — GLUCOSE, CAPILLARY
GLUCOSE-CAPILLARY: 184 mg/dL — AB (ref 65–99)
GLUCOSE-CAPILLARY: 257 mg/dL — AB (ref 65–99)
GLUCOSE-CAPILLARY: 179 mg/dL — AB (ref 65–99)
Glucose-Capillary: 175 mg/dL — ABNORMAL HIGH (ref 65–99)

## 2016-03-30 LAB — BASIC METABOLIC PANEL
Anion gap: 7 (ref 5–15)
BUN: 31 mg/dL — ABNORMAL HIGH (ref 6–20)
CO2: 26 mmol/L (ref 22–32)
Calcium: 9 mg/dL (ref 8.9–10.3)
Chloride: 100 mmol/L — ABNORMAL LOW (ref 101–111)
Creatinine, Ser: 1.24 mg/dL (ref 0.61–1.24)
GFR calc Af Amer: >60 mL/min (ref >60)
GFR calc non Af Amer: 60 mL/min — ABNORMAL LOW (ref >60)
Glucose, Bld: 134 mg/dL — ABNORMAL HIGH (ref 65–99)
Potassium: 4.7 mmol/L (ref 3.5–5.1)
Sodium: 133 mmol/L — ABNORMAL LOW (ref 135–145)

## 2016-03-30 LAB — CBC
HCT: 26.1 % — ABNORMAL LOW (ref 39.0–52.0)
Hemoglobin: 8.4 g/dL — ABNORMAL LOW (ref 13.0–17.0)
MCH: 28.2 pg (ref 26.0–34.0)
MCHC: 32.2 g/dL (ref 30.0–36.0)
MCV: 87.6 fL (ref 78.0–100.0)
Platelets: 311 10*3/uL (ref 150–400)
RBC: 2.98 MIL/uL — ABNORMAL LOW (ref 4.22–5.81)
RDW: 12.9 % (ref 11.5–15.5)
WBC: 9 10*3/uL (ref 4.0–10.5)

## 2016-03-30 LAB — CARBOXYHEMOGLOBIN
Carboxyhemoglobin: 1.7 % — ABNORMAL HIGH (ref 0.5–1.5)
Carboxyhemoglobin: 1.5 % (ref 0.5–1.5)
Methemoglobin: 0.6 % (ref 0.0–1.5)
Methemoglobin: 0.8 % (ref 0.0–1.5)
O2 SAT: 69.4 %
O2 Saturation: 49.6 %
TOTAL HEMOGLOBIN: 8.4 g/dL — AB (ref 12.0–16.0)
Total hemoglobin: 8.8 g/dL — ABNORMAL LOW (ref 12.0–16.0)

## 2016-03-30 LAB — MAGNESIUM: Magnesium: 2.2 mg/dL (ref 1.7–2.4)

## 2016-03-30 LAB — PROTIME-INR
INR: 1.27
PROTHROMBIN TIME: 16 s — AB (ref 11.4–15.2)

## 2016-03-30 MED ORDER — IVABRADINE HCL 5 MG PO TABS
5.0000 mg | ORAL_TABLET | Freq: Once | ORAL | Status: AC
  Administered 2016-03-30: 5 mg via ORAL
  Filled 2016-03-30: qty 1

## 2016-03-30 MED ORDER — TORSEMIDE 20 MG PO TABS
20.0000 mg | ORAL_TABLET | Freq: Every day | ORAL | Status: DC
  Administered 2016-03-30 – 2016-04-03 (×5): 20 mg via ORAL
  Filled 2016-03-30 (×5): qty 1

## 2016-03-30 MED ORDER — WARFARIN SODIUM 7.5 MG PO TABS
7.5000 mg | ORAL_TABLET | Freq: Once | ORAL | Status: AC
  Administered 2016-03-30: 7.5 mg via ORAL
  Filled 2016-03-30: qty 1

## 2016-03-30 NOTE — Progress Notes (Signed)
Advanced Heart Failure Rounding Note   Subjective:    Asked by Dr Prescott Gum to follow for cardiogenic shock and marked volume overload. He is S/P CABG x1 and VSD repair 03/13/16  Underwent R 1st and 2nd toe amp 9/23. Wound vac in place.  Coox 69.4% this am on milrinone 0.125 mcg. Off norepi. CVP 8  Feeling great today. Denies SOB with PT.  Doing seated exercise with NWB status s/p Wound vac.   Out 800 cc. Weight stable.  Creatinine 1.24. 1.3-1.5. K 4.7. Magnesium pending. Pt down 33 lbs from highest weight this admission.   Objective:   Weight Range:  Vital Signs:   Temp:  [97.6 F (36.4 C)-98.4 F (36.9 C)] 98.3 F (36.8 C) (09/29 0753) Pulse Rate:  [68-101] 101 (09/29 0753) Resp:  [16-27] 27 (09/29 0753) BP: (92-111)/(58-67) 111/67 (09/29 0753) SpO2:  [96 %-100 %] 100 % (09/29 0753) Weight:  [150 lb 3.2 oz (68.1 kg)] 150 lb 3.2 oz (68.1 kg) (09/29 0600) Last BM Date: 03/29/16  Weight change: Filed Weights   03/28/16 0347 03/29/16 0300 03/30/16 0600  Weight: 150 lb 6.4 oz (68.2 kg) 150 lb 6.4 oz (68.2 kg) 150 lb 3.2 oz (68.1 kg)    Intake/Output:   Intake/Output Summary (Last 24 hours) at 03/30/16 0820 Last data filed at 03/30/16 0700  Gross per 24 hour  Intake           1094.4 ml  Output             1600 ml  Net           -505.6 ml    Physical Exam: CVP ~8 cm  with prominent v-waves.  General:  Sitting in chair HEENT: Normal Neck: supple. JVP 7-8. Carotids 2+ bilat; no bruits. No thyromegaly or nodule noted.   Cor: PMI nondisplaced. RRR. No M/G/R Lungs: Clear, normal effort Abdomen: soft, NT, ND, no HSM. No bruits or masses. +BS  Extremities: no cyanosis, clubbing, rash, Minimal ankle edema. SCDs in place. R foot wound vac in place Neuro: A&O x3, cranial nerves grossly intact. moves all 4 extremities w/o difficulty. Affect pleasant  Telemetry: Reviewed, ST 100s       Labs: Basic Metabolic Panel:  Recent Labs Lab 03/23/16 0950  03/26/16 0500  03/27/16 0445 03/28/16 0430 03/29/16 0410 03/30/16 0430  NA 133*  < > 132* 129* 128* 131* 133*  K 4.0  < > 4.5 4.4 4.3 4.6 4.7  CL 90*  < > 89* 89* 90* 97* 100*  CO2 33*  < > 33* 30 28 28 26   GLUCOSE 214*  < > 184* 172* 229* 145* 134*  BUN 33*  < > 39* 36* 38* 36* 31*  CREATININE 1.21  < > 1.40* 1.30* 1.50* 1.30* 1.24  CALCIUM 9.2  < > 9.4 9.2 9.0 9.1 9.0  MG 1.7  --   --   --   --   --   --   < > = values in this interval not displayed.  Liver Function Tests: No results for input(s): AST, ALT, ALKPHOS, BILITOT, PROT, ALBUMIN in the last 168 hours. No results for input(s): LIPASE, AMYLASE in the last 168 hours. No results for input(s): AMMONIA in the last 168 hours.  CBC:  Recent Labs Lab 03/23/16 0950 03/24/16 0448 03/25/16 0327 03/30/16 0430  WBC 14.8* 16.9* 16.2* 9.0  HGB 10.2* 10.0* 9.4* 8.4*  HCT 30.8* 30.8* 28.4* 26.1*  MCV 87.7 88.8 87.7 87.6  PLT 508* 469* 413* 311    Cardiac Enzymes: No results for input(s): CKTOTAL, CKMB, CKMBINDEX, TROPONINI in the last 168 hours.  BNP: BNP (last 3 results) No results for input(s): BNP in the last 8760 hours.  ProBNP (last 3 results) No results for input(s): PROBNP in the last 8760 hours.    Other results:  Imaging: No results found.   Medications:     Scheduled Medications: . amiodarone  200 mg Oral Daily  . aspirin EC  325 mg Oral Daily   Or  . aspirin  324 mg Per Tube Daily  . bisacodyl  10 mg Oral Daily  . digoxin  0.125 mg Oral Daily  . docusate sodium  200 mg Oral Daily  . enoxaparin (LOVENOX) injection  40 mg Subcutaneous Q24H  . insulin aspart  0-15 Units Subcutaneous TID WC  . insulin aspart  0-5 Units Subcutaneous QHS  . insulin detemir  12 Units Subcutaneous BID  . mouth rinse  15 mL Mouth Rinse BID  . midodrine  5 mg Oral TID WC  . mometasone-formoterol  2 puff Inhalation BID  . nicotine  14 mg Transdermal QHS  . pantoprazole  40 mg Oral Daily  . polyethylene glycol  17 g Oral Daily  .  rosuvastatin  40 mg Oral q1800  . sodium chloride flush  10-40 mL Intracatheter Q12H  . spironolactone  25 mg Oral Daily  . warfarin   Does not apply Once  . Warfarin - Pharmacist Dosing Inpatient   Does not apply q1800    Infusions: . sodium chloride Stopped (03/24/16 1900)  . sodium chloride 10 mL/hr at 03/28/16 0600  . lactated ringers Stopped (03/23/16 1600)  . milrinone 0.125 mcg/kg/min (03/30/16 0436)  . norepinephrine (LEVOPHED) Adult infusion Stopped (03/27/16 1422)    PRN Medications: acetaminophen **OR** acetaminophen, methocarbamol **OR** methocarbamol (ROBAXIN)  IV, metoCLOPramide **OR** metoCLOPramide (REGLAN) injection, ondansetron **OR** ondansetron (ZOFRAN) IV, oxyCODONE, sodium chloride flush, traMADol   Assessment:   1. Inferiro STEMI CAD-->S/P CABG x1 VSD Repair 2. Cardiogenic Shock 3. Ischemic R Great Toe s/p amputation 9/22 4. Hyperlipidemia 5. DM Type II 6. PAF 7. Hypoalbumin-2.3  8. AKI 9. Hypokalemia/hyponaremia   Plan/Discussion:    S/P CABG x1 VSD repair 03/13/16  Co-ox 69.0%. Stop milrinone. Recheck coox this afternoon.   CVP trending up slightly. Do not think he will need BID torsemide. Will discuss optimal dosing with MD.   Can increase midodrine prn.   Continue digoxin. Continue spiro 25 mg daily. Hold off on arb with hypotension and recent AKI.   No further AF. Continue Amio 200 mg daily. Loading coumadin. Dosing per pharm. INR 1.27  S/p R 1st and 2nd toe amputation. Wound vac in place. Will not need further surgery per primary.   Length of Stay: [address]  Annamaria Helling 03/30/2016, 8:20 AM  Advanced Heart Failure Team Pager [phone] (M-F; 7a - 4p)  Please contact [facility] for night-coverage after hours (4p -7a ) and weekends on [email or web address]  Patient seen and examined with Oda Kilts, PA-C. We discussed all aspects of the encounter. I agree with the assessment and plan as stated above.   Volume status climbing  back up. Restart torsemide 20 daily.   BP and co-ox improved. Will turn milrinone off. Continue midodrine.   Unclear if rhythm sinus tach or atrial tach. Will give trial dose of ivbradine to help sort out.   Continue coumadin  Continue PT  May be ready for d/c  over next few days.   Nickalos Petersen,MD 11:41 AM

## 2016-03-30 NOTE — Progress Notes (Signed)
Discussed repeat coox (49.6%) with Dr. Haroldine Laws.  Low, but pt relatively asymptomatic and not great candidate for home milrinone.     Will recheck coox in am and adjust as needed.    Legrand Como [address]" [place], PA-C 03/30/2016 2:54 PM

## 2016-03-30 NOTE — Progress Notes (Addendum)
CARDIAC REHAB PHASE I   Pt remains NWB RLE with sternal precautions, not appropriate for ambulation with cardiac rehab at this time. Cardiac surgery discharge/HF education completed in anticipation of possible discharge over the weekend. Reviewed risk factors, tobacco cessation, IS, sternal precautions, activity progression, exercise guidelines, heart healthy diet, carb counting, sodium and fluid restrictions, daily weights, CHF booklet and zone tool, and phase 2 cardiac rehab. Pt verbalized understanding, receptive to education. Pt agrees to phase 2 cardiac rehab referral, will send to [facility] per pt request. Pt in bed, call bell within reach. Will sign off at this time due to non-ambulatory status. Please re-order should need arise.  [place], RN, BSN 03/30/2016 3:11 PM

## 2016-03-30 NOTE — Progress Notes (Addendum)
TCTS DAILY ICU PROGRESS NOTE                   [address]            [place] [address]          [phone]   7 Days Post-Op Procedure(s) (LRB): 1st and 2nd Ray Amputation Right Foot (Right)  Total Length of Stay:  LOS: 17 days   Subjective conts to feel better, CO-OX 69.4  Objective: Vital signs in last 24 hours: Temp:  [97.6 F (36.4 C)-98.4 F (36.9 C)] 98.3 F (36.8 C) (09/29 0753) Pulse Rate:  [68-101] 101 (09/29 0753) Cardiac Rhythm: Normal sinus rhythm (09/29 0330) Resp:  [16-27] 27 (09/29 0753) BP: (92-111)/(58-67) 111/67 (09/29 0753) SpO2:  [96 %-100 %] 100 % (09/29 0753) Weight:  [150 lb 3.2 oz (68.1 kg)] 150 lb 3.2 oz (68.1 kg) (09/29 0600)  Filed Weights   03/28/16 0347 03/29/16 0300 03/30/16 0600  Weight: 150 lb 6.4 oz (68.2 kg) 150 lb 6.4 oz (68.2 kg) 150 lb 3.2 oz (68.1 kg)    Weight change: -3.2 oz (-0.091 kg)   Hemodynamic parameters for last 24 hours: CVP:  [6 mmHg-8 mmHg] 8 mmHg  Intake/Output from previous day: 09/28 0701 - 09/29 0700 In: 1107.2 [P.O.:490; I.V.:617.2] Out: 1600 [Urine:1300; Stool:300]  Intake/Output this shift: No intake/output data recorded.  Current Meds: Scheduled Meds: . amiodarone  200 mg Oral Daily  . aspirin EC  325 mg Oral Daily   Or  . aspirin  324 mg Per Tube Daily  . bisacodyl  10 mg Oral Daily  . digoxin  0.125 mg Oral Daily  . docusate sodium  200 mg Oral Daily  . enoxaparin (LOVENOX) injection  40 mg Subcutaneous Q24H  . insulin aspart  0-15 Units Subcutaneous TID WC  . insulin aspart  0-5 Units Subcutaneous QHS  . insulin detemir  12 Units Subcutaneous BID  . mouth rinse  15 mL Mouth Rinse BID  . midodrine  5 mg Oral TID WC  . mometasone-formoterol  2 puff Inhalation BID  . nicotine  14 mg Transdermal QHS  . pantoprazole  40 mg Oral Daily  . polyethylene glycol  17 g Oral Daily  . rosuvastatin  40 mg Oral q1800  . sodium chloride flush  10-40 mL Intracatheter Q12H  .  spironolactone  25 mg Oral Daily  . warfarin   Does not apply Once  . Warfarin - Pharmacist Dosing Inpatient   Does not apply q1800   Continuous Infusions: . sodium chloride Stopped (03/24/16 1900)  . sodium chloride 10 mL/hr at 03/28/16 0600  . lactated ringers Stopped (03/23/16 1600)  . milrinone 0.125 mcg/kg/min (03/30/16 0436)  . norepinephrine (LEVOPHED) Adult infusion Stopped (03/27/16 1422)   PRN Meds:.acetaminophen **OR** acetaminophen, methocarbamol **OR** methocarbamol (ROBAXIN)  IV, metoCLOPramide **OR** metoCLOPramide (REGLAN) injection, ondansetron **OR** ondansetron (ZOFRAN) IV, oxyCODONE, sodium chloride flush, traMADol  General appearance: no distress Heart: regular rate and rhythm Lungs: mildly dim right base Abdomen: benign Extremities: no edema Wound: incis healing well,  right foot vac in place  Lab Results: CBC: Recent Labs  03/30/16 0430  WBC 9.0  HGB 8.4*  HCT 26.1*  PLT 311   BMET:  Recent Labs  03/29/16 0410 03/30/16 0430  NA 131* 133*  K 4.6 4.7  CL 97* 100*  CO2 28 26  GLUCOSE 145* 134*  BUN 36* 31*  CREATININE 1.30* 1.24  CALCIUM 9.1 9.0  CMET: Lab Results  Component Value Date   WBC 9.0 03/30/2016   HGB 8.4 (L) 03/30/2016   HCT 26.1 (L) 03/30/2016   PLT 311 03/30/2016   GLUCOSE 134 (H) 03/30/2016   CHOL 96 03/13/2016   TRIG 146 03/13/2016   HDL 23 (L) 03/13/2016   LDLCALC 44 03/13/2016   ALT 19 03/22/2016   AST 19 03/22/2016   NA 133 (L) 03/30/2016   K 4.7 03/30/2016   CL 100 (L) 03/30/2016   CREATININE 1.24 03/30/2016   BUN 31 (H) 03/30/2016   CO2 26 03/30/2016   TSH 1.885 07/10/2011   INR 1.27 03/30/2016   HGBA1C 10.7 (H) 03/13/2016    PT/INR:  Recent Labs  03/30/16 0430  LABPROT 16.0*  INR 1.27   Radiology: No results found.   Assessment/Plan: S/P Procedure(s) (LRB): 1st and 2nd Ray Amputation Right Foot (Right)  1 conts with good overall recovery, hopefully can get milrinone off today- AHF team  managing 2 push rehab as able 3 labs stable, creat improved 4 conts coumadin   GOLD,WAYNE E 03/30/2016 8:05 AM   Slowly improving  inr 1.27 I have seen and examined William [name] and agree with the above assessment  and plan.  Grace Isaac MD [place] [phone] Office [phone] 03/30/2016 10:29 AM

## 2016-03-30 NOTE — Progress Notes (Signed)
ANTICOAGULATION CONSULT NOTE - Follow Up Consult  Pharmacy Consult for Coumadin Indication: prosthetic material used to repair post-MI VSD  Allergies  Allergen Reactions  . Morphine And Related Other (See Comments)    UNSPECIFIED REACTION "Pt said it was too much"     Patient Measurements: Height: 5\' 5"  (165.1 cm) Weight: 150 lb 3.2 oz (68.1 kg) IBW/kg (Calculated) : 61.5  Vital Signs: Temp: 98.3 F (36.8 C) (09/29 0753) Temp Source: Oral (09/29 0753) BP: 111/67 (09/29 0753) Pulse Rate: 100 (09/29 0800)  Labs:  Recent Labs  03/28/16 0430 03/29/16 0410 03/30/16 0430  HGB  --   --  8.4*  HCT  --   --  26.1*  PLT  --   --  311  LABPROT  --  14.7 16.0*  INR  --  1.15 1.27  CREATININE 1.50* 1.30* 1.24    Estimated Creatinine Clearance: 52.4 mL/min (by C-G formula based on SCr of 1.24 mg/dL).  Assessment: 64yom s/p STEMI and repair of post-MI VSD was started on coumadin 9/27. INR 1.27 after two doses of 7.5mg . He continues on po amiodarone. Hgb slowly trending down 10>9.4>8.4. No bleeding.   Goal of Therapy:  INR 2-3 Monitor platelets by anticoagulation protocol: Yes   Plan:  1) Repeat coumadin 7.5mg  x 1 2) Daily INR 3) Continue lovenox 40 sq q24 until INR therapeutic   Deboraha Sprang 03/30/2016,9:57 AM

## 2016-03-31 DIAGNOSIS — I5022 Chronic systolic (congestive) heart failure: Secondary | ICD-10-CM

## 2016-03-31 LAB — CARBOXYHEMOGLOBIN
Carboxyhemoglobin: 1.5 % (ref 0.5–1.5)
METHEMOGLOBIN: 0.7 % (ref 0.0–1.5)
O2 SAT: 53 %
TOTAL HEMOGLOBIN: 10.7 g/dL — AB (ref 12.0–16.0)

## 2016-03-31 LAB — BASIC METABOLIC PANEL
Anion gap: 9 (ref 5–15)
BUN: 34 mg/dL — ABNORMAL HIGH (ref 6–20)
CO2: 27 mmol/L (ref 22–32)
Calcium: 9.4 mg/dL (ref 8.9–10.3)
Chloride: 99 mmol/L — ABNORMAL LOW (ref 101–111)
Creatinine, Ser: 1.58 mg/dL — ABNORMAL HIGH (ref 0.61–1.24)
GFR calc Af Amer: 52 mL/min — ABNORMAL LOW (ref >60)
GFR calc non Af Amer: 45 mL/min — ABNORMAL LOW (ref >60)
Glucose, Bld: 143 mg/dL — ABNORMAL HIGH (ref 65–99)
Potassium: 4.7 mmol/L (ref 3.5–5.1)
Sodium: 135 mmol/L (ref 135–145)

## 2016-03-31 LAB — GLUCOSE, CAPILLARY
GLUCOSE-CAPILLARY: 310 mg/dL — AB (ref 65–99)
GLUCOSE-CAPILLARY: 97 mg/dL (ref 65–99)
GLUCOSE-CAPILLARY: 163 mg/dL — AB (ref 65–99)
Glucose-Capillary: 188 mg/dL — ABNORMAL HIGH (ref 65–99)

## 2016-03-31 LAB — PROTIME-INR
INR: 1.42
PROTHROMBIN TIME: 17.5 s — AB (ref 11.4–15.2)

## 2016-03-31 MED ORDER — WARFARIN SODIUM 7.5 MG PO TABS
7.5000 mg | ORAL_TABLET | Freq: Once | ORAL | Status: AC
  Administered 2016-03-31: 7.5 mg via ORAL
  Filled 2016-03-31: qty 1

## 2016-03-31 NOTE — Progress Notes (Signed)
Pt arrived to unit from [facility].  Telemetry placed and CCMD notified.  Pt oriented to room including call light and telephone.  Pt denies chest pain.  Will cont to monitor.

## 2016-03-31 NOTE — Progress Notes (Addendum)
TCTS DAILY ICU PROGRESS NOTE                   [address]            [place] [address]          [phone]   8 Days Post-Op Procedure(s) (LRB): 1st and 2nd Ray Amputation Right Foot (Right)  Total Length of Stay:  LOS: 18 days   Subjective: conts to feel well  Objective: Vital signs in last 24 hours: Temp:  [98.4 F (36.9 C)-99.6 F (37.6 C)] 98.4 F (36.9 C) (09/30 0746) Pulse Rate:  [88-100] 94 (09/30 0746) Cardiac Rhythm: Normal sinus rhythm (09/30 0700) Resp:  [18-24] 19 (09/30 0746) BP: (88-105)/(54-68) 105/61 (09/30 0746) SpO2:  [98 %-100 %] 98 % (09/30 0746) Weight:  [148 lb 14.4 oz (67.5 kg)] 148 lb 14.4 oz (67.5 kg) (09/30 0355)  Filed Weights   03/29/16 0300 03/30/16 0600 03/31/16 0355  Weight: 150 lb 6.4 oz (68.2 kg) 150 lb 3.2 oz (68.1 kg) 148 lb 14.4 oz (67.5 kg)    Weight change: -1 lb 4.8 oz (-0.59 kg)   Hemodynamic parameters for last 24 hours: CVP:  [5 mmHg-11 mmHg] 6 mmHg  Intake/Output from previous day: 09/29 0701 - 09/30 0700 In: 1284.2 [P.O.:1080; I.V.:204.2] Out: 3000 [Urine:3000]  Intake/Output this shift: No intake/output data recorded.  Current Meds: Scheduled Meds: . amiodarone  200 mg Oral Daily  . aspirin EC  325 mg Oral Daily   Or  . aspirin  324 mg Per Tube Daily  . bisacodyl  10 mg Oral Daily  . digoxin  0.125 mg Oral Daily  . docusate sodium  200 mg Oral Daily  . enoxaparin (LOVENOX) injection  40 mg Subcutaneous Q24H  . insulin aspart  0-15 Units Subcutaneous TID WC  . insulin aspart  0-5 Units Subcutaneous QHS  . insulin detemir  12 Units Subcutaneous BID  . mouth rinse  15 mL Mouth Rinse BID  . midodrine  5 mg Oral TID WC  . mometasone-formoterol  2 puff Inhalation BID  . nicotine  14 mg Transdermal QHS  . pantoprazole  40 mg Oral Daily  . polyethylene glycol  17 g Oral Daily  . rosuvastatin  40 mg Oral q1800  . sodium chloride flush  10-40 mL Intracatheter Q12H  . spironolactone  25 mg Oral  Daily  . torsemide  20 mg Oral Daily  . warfarin  7.5 mg Oral ONCE-1800  . warfarin   Does not apply Once  . Warfarin - Pharmacist Dosing Inpatient   Does not apply q1800   Continuous Infusions: . sodium chloride Stopped (03/24/16 1900)  . sodium chloride 10 mL/hr at 03/28/16 0600  . lactated ringers Stopped (03/23/16 1600)  . norepinephrine (LEVOPHED) Adult infusion Stopped (03/27/16 1422)   PRN Meds:.acetaminophen **OR** acetaminophen, methocarbamol **OR** methocarbamol (ROBAXIN)  IV, metoCLOPramide **OR** metoCLOPramide (REGLAN) injection, ondansetron **OR** ondansetron (ZOFRAN) IV, oxyCODONE, sodium chloride flush, traMADol  General appearance: alert, cooperative and no distress Heart: regular rate and rhythm Lungs: clear to auscultation bilaterally Abdomen: benign Extremities: no edema Wound: r foot dressing in place  Lab Results: CBC: Recent Labs  03/30/16 0430  WBC 9.0  HGB 8.4*  HCT 26.1*  PLT 311   BMET:  Recent Labs  03/30/16 0430 03/31/16 0426  NA 133* 135  K 4.7 4.7  CL 100* 99*  CO2 26 27  GLUCOSE 134* 143*  BUN 31* 34*  CREATININE 1.24  1.58*  CALCIUM 9.0 9.4    CMET: Lab Results  Component Value Date   WBC 9.0 03/30/2016   HGB 8.4 (L) 03/30/2016   HCT 26.1 (L) 03/30/2016   PLT 311 03/30/2016   GLUCOSE 143 (H) 03/31/2016   CHOL 96 03/13/2016   TRIG 146 03/13/2016   HDL 23 (L) 03/13/2016   LDLCALC 44 03/13/2016   ALT 19 03/22/2016   AST 19 03/22/2016   NA 135 03/31/2016   K 4.7 03/31/2016   CL 99 (L) 03/31/2016   CREATININE 1.58 (H) 03/31/2016   BUN 34 (H) 03/31/2016   CO2 27 03/31/2016   TSH 1.885 07/10/2011   INR 1.42 03/31/2016   HGBA1C 10.7 (H) 03/13/2016    PT/INR:  Recent Labs  03/31/16 0426  LABPROT 17.5*  INR 1.42   Radiology: No results found.   Assessment/Plan: S/P Procedure(s) (LRB): 1st and 2nd Ray Amputation Right Foot (Right)  1 conts to do well 2 creat up a bit - plans per AHF team noted 3 will tx to [facility] 03/31/2016 10:58 AM   Patient seen and examined agree with above  Remo Lipps C. Roxan Hockey, MD [facility] [phone]

## 2016-03-31 NOTE — Progress Notes (Signed)
ANTICOAGULATION CONSULT NOTE - Follow Up Consult  Pharmacy Consult for Coumadin Indication: prosthetic material used to repair post-MI VSD  Allergies  Allergen Reactions  . Morphine And Related Other (See Comments)    UNSPECIFIED REACTION "Pt said it was too much"     Patient Measurements: Height: 5\' 5"  (165.1 cm) Weight: 148 lb 14.4 oz (67.5 kg) IBW/kg (Calculated) : 61.5  Vital Signs: Temp: 98.4 F (36.9 C) (09/30 0746) Temp Source: Oral (09/30 0746) BP: 105/61 (09/30 0746) Pulse Rate: 94 (09/30 0746)  Labs:  Recent Labs  03/29/16 0410 03/30/16 0430 03/31/16 0426  HGB  --  8.4*  --   HCT  --  26.1*  --   PLT  --  311  --   LABPROT 14.7 16.0* 17.5*  INR 1.15 1.27 1.42  CREATININE 1.30* 1.24 1.58*    Estimated Creatinine Clearance: 41.1 mL/min (by C-G formula based on SCr of 1.58 mg/dL (H)).  Assessment: 64yom s/p STEMI and repair of post-MI VSD was started on coumadin 9/27. INR 1.42 after three doses of 7.5mg . He continues on po amiodarone. Hgb slowly trending down 10>9.4>8.4. No bleeding.   Goal of Therapy:  INR 2-3 Monitor platelets by anticoagulation protocol: Yes   Plan:  1) Repeat coumadin 7.5mg  x 1 2) Daily INR 3) Continue lovenox 40 sq q24 until INR therapeutic  Angela Burke, PharmD Pharmacy Resident Pager: [phone] 03/31/2016,9:12 AM

## 2016-03-31 NOTE — Progress Notes (Signed)
Patient ID: William [name], male   DOB: [birth year], 65 y.o.   MRN: [id number]    Advanced Heart Failure Rounding Note   Subjective:    Asked by Dr Prescott Gum to follow for cardiogenic shock and marked volume overload. He is S/P CABG x1 and VSD repair 03/13/16  Underwent R 1st and 2nd toe amp 9/23. Wound vac in place.  Coox 53% this am, off milrinone.  CVP 5-7.   Feels good today, no dyspnea working with PT.   Creatinine up to 1.5, on po torsemide.    BP on the lower side but stable, still on midodrine.   Objective:   Weight Range:  Vital Signs:   Temp:  [98.4 F (36.9 C)-99.6 F (37.6 C)] 98.4 F (36.9 C) (09/30 0746) Pulse Rate:  [88-103] 94 (09/30 0746) Resp:  [18-24] 19 (09/30 0746) BP: (88-105)/(54-68) 105/61 (09/30 0746) SpO2:  [93 %-100 %] 98 % (09/30 0746) Weight:  [148 lb 14.4 oz (67.5 kg)] 148 lb 14.4 oz (67.5 kg) (09/30 0355) Last BM Date: 03/29/16  Weight change: Filed Weights   03/29/16 0300 03/30/16 0600 03/31/16 0355  Weight: 150 lb 6.4 oz (68.2 kg) 150 lb 3.2 oz (68.1 kg) 148 lb 14.4 oz (67.5 kg)    Intake/Output:   Intake/Output Summary (Last 24 hours) at 03/31/16 0841 Last data filed at 03/31/16 0400  Gross per 24 hour  Intake           1271.4 ml  Output             3000 ml  Net          -1728.6 ml    Physical Exam: CVP 5-7.  General:  Sitting in chair HEENT: Normal Neck: supple. JVP 7-8. Carotids 2+ bilat; no bruits. No thyromegaly or nodule noted.   Cor: PMI nondisplaced. RRR. No M/G/R Lungs: Clear, normal effort Abdomen: soft, NT, ND, no HSM. No bruits or masses. +BS  Extremities: no cyanosis, clubbing, rash, Minimal ankle edema. SCDs in place. R foot wound vac in place Neuro: A&O x3, cranial nerves grossly intact. moves all 4 extremities w/o difficulty. Affect pleasant  Telemetry: Reviewed, ST 100s       Labs: Basic Metabolic Panel:  Recent Labs Lab 03/27/16 0445 03/28/16 0430 03/29/16 0410 03/30/16 0430 03/30/16 0833  03/31/16 0426  NA 129* 128* 131* 133*  --  135  K 4.4 4.3 4.6 4.7  --  4.7  CL 89* 90* 97* 100*  --  99*  CO2 30 28 28 26   --  27  GLUCOSE 172* 229* 145* 134*  --  143*  BUN 36* 38* 36* 31*  --  34*  CREATININE 1.30* 1.50* 1.30* 1.24  --  1.58*  CALCIUM 9.2 9.0 9.1 9.0  --  9.4  MG  --   --   --   --  2.2  --     Liver Function Tests: No results for input(s): AST, ALT, ALKPHOS, BILITOT, PROT, ALBUMIN in the last 168 hours. No results for input(s): LIPASE, AMYLASE in the last 168 hours. No results for input(s): AMMONIA in the last 168 hours.  CBC:  Recent Labs Lab 03/25/16 0327 03/30/16 0430  WBC 16.2* 9.0  HGB 9.4* 8.4*  HCT 28.4* 26.1*  MCV 87.7 87.6  PLT 413* 311    Cardiac Enzymes: No results for input(s): CKTOTAL, CKMB, CKMBINDEX, TROPONINI in the last 168 hours.  BNP: BNP (last 3 results) No results for input(s): BNP in the  last 8760 hours.  ProBNP (last 3 results) No results for input(s): PROBNP in the last 8760 hours.    Other results:  Imaging: No results found.   Medications:     Scheduled Medications: . amiodarone  200 mg Oral Daily  . aspirin EC  325 mg Oral Daily   Or  . aspirin  324 mg Per Tube Daily  . bisacodyl  10 mg Oral Daily  . digoxin  0.125 mg Oral Daily  . docusate sodium  200 mg Oral Daily  . enoxaparin (LOVENOX) injection  40 mg Subcutaneous Q24H  . insulin aspart  0-15 Units Subcutaneous TID WC  . insulin aspart  0-5 Units Subcutaneous QHS  . insulin detemir  12 Units Subcutaneous BID  . mouth rinse  15 mL Mouth Rinse BID  . midodrine  5 mg Oral TID WC  . mometasone-formoterol  2 puff Inhalation BID  . nicotine  14 mg Transdermal QHS  . pantoprazole  40 mg Oral Daily  . polyethylene glycol  17 g Oral Daily  . rosuvastatin  40 mg Oral q1800  . sodium chloride flush  10-40 mL Intracatheter Q12H  . spironolactone  25 mg Oral Daily  . torsemide  20 mg Oral Daily  . warfarin   Does not apply Once  . Warfarin - Pharmacist  Dosing Inpatient   Does not apply q1800    Infusions: . sodium chloride Stopped (03/24/16 1900)  . sodium chloride 10 mL/hr at 03/28/16 0600  . lactated ringers Stopped (03/23/16 1600)  . norepinephrine (LEVOPHED) Adult infusion Stopped (03/27/16 1422)    PRN Medications: acetaminophen **OR** acetaminophen, methocarbamol **OR** methocarbamol (ROBAXIN)  IV, metoCLOPramide **OR** metoCLOPramide (REGLAN) injection, ondansetron **OR** ondansetron (ZOFRAN) IV, oxyCODONE, sodium chloride flush, traMADol   Assessment:   1. Inferiro STEMI CAD-->S/P CABG x1 VSD Repair 2. Cardiogenic Shock: EF 40-45% on 9/17 echo.  3. Ischemic R Great Toe s/p amputation 9/22 4. Hyperlipidemia 5. DM Type II 6. PAF 7. Hypoalbumin-2.3  8. AKI 9. Hypokalemia/hyponatremia   Plan/Discussion:    S/P CABG x1 VSD repair 03/13/16  Co-ox 53% with CVP 5-7.  Creatinine up mildly. - Leave on torsemide 20 daily for now.  - Continue digoxin and will leave off milrinone.  Feels good and not a great candidate for home milrinone.    Hold off on ACEI/ARB with hypotension and recent AKI.   No further AF.  Continue Amio 200 mg daily.  Loading coumadin. Dosing per pharm. INR 1.4.  Reviewed telemetry, probably mild sinus tachycardia but will get ECG to assess for atrial tachycardia.   S/p R 1st and 2nd toe amputation. Wound vac in place. Will not need further surgery per primary.   Possibly will be ready for d/c Monday.   Length of Stay: [address]  03/31/2016, 8:41 AM  Advanced Heart Failure Team Pager [phone] (M-F; 7a - 4p)  Please contact [facility] for night-coverage after hours (4p -7a ) and weekends on [email or web address]

## 2016-04-01 LAB — BASIC METABOLIC PANEL
Anion gap: 9 (ref 5–15)
BUN: 36 mg/dL — ABNORMAL HIGH (ref 6–20)
CALCIUM: 9.2 mg/dL (ref 8.9–10.3)
CHLORIDE: 96 mmol/L — AB (ref 101–111)
CO2: 29 mmol/L (ref 22–32)
CREATININE: 1.37 mg/dL — AB (ref 0.61–1.24)
GFR calc non Af Amer: 53 mL/min — ABNORMAL LOW (ref >60)
Glucose, Bld: 117 mg/dL — ABNORMAL HIGH (ref 65–99)
Potassium: 4.3 mmol/L (ref 3.5–5.1)
SODIUM: 134 mmol/L — AB (ref 135–145)

## 2016-04-01 LAB — CBC
HCT: 27.8 % — ABNORMAL LOW (ref 39.0–52.0)
HEMOGLOBIN: 8.9 g/dL — AB (ref 13.0–17.0)
MCH: 28.2 pg (ref 26.0–34.0)
MCHC: 32 g/dL (ref 30.0–36.0)
MCV: 88 fL (ref 78.0–100.0)
Platelets: 327 10*3/uL (ref 150–400)
RBC: 3.16 MIL/uL — ABNORMAL LOW (ref 4.22–5.81)
RDW: 12.8 % (ref 11.5–15.5)
WBC: 7.6 10*3/uL (ref 4.0–10.5)

## 2016-04-01 LAB — GLUCOSE, CAPILLARY
GLUCOSE-CAPILLARY: 123 mg/dL — AB (ref 65–99)
Glucose-Capillary: 222 mg/dL — ABNORMAL HIGH (ref 65–99)
Glucose-Capillary: 210 mg/dL — ABNORMAL HIGH (ref 65–99)
Glucose-Capillary: 131 mg/dL — ABNORMAL HIGH (ref 65–99)

## 2016-04-01 LAB — PROTIME-INR
INR: 1.75
PROTHROMBIN TIME: 20.7 s — AB (ref 11.4–15.2)

## 2016-04-01 MED ORDER — WARFARIN SODIUM 7.5 MG PO TABS
7.5000 mg | ORAL_TABLET | Freq: Once | ORAL | Status: AC
  Administered 2016-04-01: 7.5 mg via ORAL
  Filled 2016-04-01: qty 1

## 2016-04-01 MED ORDER — INSULIN DETEMIR 100 UNIT/ML ~~LOC~~ SOLN
18.0000 [IU] | Freq: Two times a day (BID) | SUBCUTANEOUS | Status: DC
  Administered 2016-04-01 – 2016-04-03 (×5): 18 [IU] via SUBCUTANEOUS
  Filled 2016-04-01 (×6): qty 0.18

## 2016-04-01 NOTE — Progress Notes (Signed)
Patient ID: William [name], male   DOB: 01-31-1951, 65 y.o.   MRN: [id number] P   Advanced Heart Failure Rounding Note   Subjective:    Asked by Dr Prescott Gum to follow for cardiogenic shock and marked volume overload. He is S/P CABG x1 and VSD repair 03/13/16  Underwent R 1st and 2nd toe amp 9/23.   Feels good today, no dyspnea working with PT.   Creatinine stable, on po torsemide.    BP on the lower side but stable, still on midodrine.   ECG yesterday with sinus tachycardia (not atrial tachycardia).   Wound vac taken off right foot, has some black eschar at edge of surgical site, back toe tip noted.   Objective:   Weight Range:  Vital Signs:   Temp:  [98.3 F (36.8 C)-98.6 F (37 C)] 98.6 F (37 C) (10/01 0543) Pulse Rate:  [86-98] 92 (10/01 0543) Resp:  [10-18] 18 (10/01 0543) BP: (86-102)/(49-61) 86/49 (10/01 0543) SpO2:  [96 %-100 %] 98 % (10/01 0543) Weight:  [144 lb 10 oz (65.6 kg)] 144 lb 10 oz (65.6 kg) (10/01 0543) Last BM Date: 03/31/16  Weight change: Filed Weights   03/30/16 0600 03/31/16 0355 04/01/16 0543  Weight: 150 lb 3.2 oz (68.1 kg) 148 lb 14.4 oz (67.5 kg) 144 lb 10 oz (65.6 kg)    Intake/Output:   Intake/Output Summary (Last 24 hours) at 04/01/16 0841 Last data filed at 04/01/16 0700  Gross per 24 hour  Intake              942 ml  Output                0 ml  Net              942 ml    Physical Exam: General:  Sitting in chair HEENT: Normal Neck: supple. JVP 7-8. Carotids 2+ bilat; no bruits. No thyromegaly or nodule noted.   Cor: PMI nondisplaced. RRR. No M/G/R Lungs: Clear, normal effort Abdomen: soft, NT, ND, no HSM. No bruits or masses. +BS  Extremities: no cyanosis, clubbing, rash, Minimal ankle edema. SCDs in place. Wound vac off, black toe tip noted right foot and black eschar along edge of surgical site.  Neuro: A&O x3, cranial nerves grossly intact. moves all 4 extremities w/o difficulty. Affect pleasant  Telemetry: Reviewed, ST  100s       Labs: Basic Metabolic Panel:  Recent Labs Lab 03/28/16 0430 03/29/16 0410 03/30/16 0430 03/30/16 0833 03/31/16 0426 04/01/16 0405  NA 128* 131* 133*  --  135 134*  K 4.3 4.6 4.7  --  4.7 4.3  CL 90* 97* 100*  --  99* 96*  CO2 28 28 26   --  27 29  GLUCOSE 229* 145* 134*  --  143* 117*  BUN 38* 36* 31*  --  34* 36*  CREATININE 1.50* 1.30* 1.24  --  1.58* 1.37*  CALCIUM 9.0 9.1 9.0  --  9.4 9.2  MG  --   --   --  2.2  --   --     Liver Function Tests: No results for input(s): AST, ALT, ALKPHOS, BILITOT, PROT, ALBUMIN in the last 168 hours. No results for input(s): LIPASE, AMYLASE in the last 168 hours. No results for input(s): AMMONIA in the last 168 hours.  CBC:  Recent Labs Lab 03/30/16 0430 04/01/16 0405  WBC 9.0 7.6  HGB 8.4* 8.9*  HCT 26.1* 27.8*  MCV 87.6 88.0  PLT 311 327    Cardiac Enzymes: No results for input(s): CKTOTAL, CKMB, CKMBINDEX, TROPONINI in the last 168 hours.  BNP: BNP (last 3 results) No results for input(s): BNP in the last 8760 hours.  ProBNP (last 3 results) No results for input(s): PROBNP in the last 8760 hours.    Other results:  Imaging: No results found.   Medications:     Scheduled Medications: . amiodarone  200 mg Oral Daily  . aspirin EC  325 mg Oral Daily   Or  . aspirin  324 mg Per Tube Daily  . bisacodyl  10 mg Oral Daily  . digoxin  0.125 mg Oral Daily  . docusate sodium  200 mg Oral Daily  . enoxaparin (LOVENOX) injection  40 mg Subcutaneous Q24H  . insulin aspart  0-15 Units Subcutaneous TID WC  . insulin detemir  18 Units Subcutaneous BID  . mouth rinse  15 mL Mouth Rinse BID  . midodrine  5 mg Oral TID WC  . mometasone-formoterol  2 puff Inhalation BID  . nicotine  14 mg Transdermal QHS  . pantoprazole  40 mg Oral Daily  . polyethylene glycol  17 g Oral Daily  . rosuvastatin  40 mg Oral q1800  . spironolactone  25 mg Oral Daily  . torsemide  20 mg Oral Daily  . warfarin   Does not apply  Once  . Warfarin - Pharmacist Dosing Inpatient   Does not apply q1800    Infusions: . sodium chloride 10 mL/hr at 03/28/16 0600    PRN Medications: acetaminophen **OR** acetaminophen, ondansetron **OR** ondansetron (ZOFRAN) IV, oxyCODONE, traMADol   Assessment:   1. Inferiro STEMI CAD-->S/P CABG x1 VSD Repair 2. Cardiogenic Shock: EF 40-45% on 9/17 echo.  3. Ischemic R Great Toe s/p amputation 9/22 4. Hyperlipidemia 5. DM Type II 6. PAF 7. Hypoalbumin-2.3  8. AKI 9. Hypokalemia/hyponatremia   Plan/Discussion:    S/P CABG x1 VSD repair 03/13/16  Stable today from cardiac standpoint, BP low but stable. - Leave on torsemide 20 daily for now.  - Continue digoxin and will leave off milrinone.  Feels good and not a great candidate for home milrinone.    Hold off on ACEI/ARB with hypotension and recent AKI.   No further AF.  Continue Amio 200 mg daily.  Loading coumadin. Dosing per pharm. INR 1.75.  ECG yesterday showed mild sinus tachycardia (not atrial tachycardia).   S/p R 1st and 2nd toe amputation. Wound vac off, concern for residual ischemic tissue.  CVTS to have re-evaluation today by [name].  Length of Stay: [address]  04/01/2016, 8:41 AM  Advanced Heart Failure Team Pager [phone] (M-F; 7a - 4p)  Please contact [facility] for night-coverage after hours (4p -7a ) and weekends on [email or web address]

## 2016-04-01 NOTE — Progress Notes (Addendum)
[address]       [place] [address]             [phone]      9 Days Post-Op Procedure(s) (LRB): 1st and 2nd Ray Amputation Right Foot (Right) Subjective: conts to feel pretty well  Objective: Vital signs in last 24 hours: Temp:  [98.3 F (36.8 C)-98.6 F (37 C)] 98.6 F (37 C) (10/01 0543) Pulse Rate:  [86-98] 92 (10/01 0543) Cardiac Rhythm: Normal sinus rhythm (09/30 2000) Resp:  [10-18] 18 (10/01 0543) BP: (86-102)/(49-61) 86/49 (10/01 0543) SpO2:  [96 %-100 %] 98 % (10/01 0543) Weight:  [144 lb 10 oz (65.6 kg)] 144 lb 10 oz (65.6 kg) (10/01 0543)  Hemodynamic parameters for last 24 hours: CVP:  [6 mmHg-8 mmHg] 8 mmHg  Intake/Output from previous day: 09/30 0701 - 10/01 0700 In: 582 [P.O.:582] Out: -  Intake/Output this shift: No intake/output data recorded.  General appearance: alert, cooperative and no distress Heart: regular rate and rhythm Lungs: clear to auscultation bilaterally Abdomen: benign Extremities: no edema Wound: R 3rd toe with necrotic tip, CV surgery incis healing well  Foot incis with some drainage /partial dehiscence  Lab Results:  Recent Labs  03/30/16 0430 04/01/16 0405  WBC 9.0 7.6  HGB 8.4* 8.9*  HCT 26.1* 27.8*  PLT 311 327   BMET:  Recent Labs  03/31/16 0426 04/01/16 0405  NA 135 134*  K 4.7 4.3  CL 99* 96*  CO2 27 29  GLUCOSE 143* 117*  BUN 34* 36*  CREATININE 1.58* 1.37*  CALCIUM 9.4 9.2    PT/INR:  Recent Labs  04/01/16 0405  LABPROT 20.7*  INR 1.75   ABG    Component Value Date/Time   PHART 7.427 03/16/2016 0420   HCO3 25.3 03/16/2016 0420   TCO2 28 03/21/2016 0857   ACIDBASEDEF 2.0 03/14/2016 1715   O2SAT 53.0 03/31/2016 0415   CBG (last 3)   Recent Labs  03/31/16 1715 03/31/16 2104 04/01/16 0618  GLUCAP 97 163* 123*    Meds Scheduled Meds: . amiodarone  200 mg Oral Daily  . aspirin EC  325 mg Oral Daily   Or  . aspirin  324 mg Per Tube Daily  . bisacodyl   10 mg Oral Daily  . digoxin  0.125 mg Oral Daily  . docusate sodium  200 mg Oral Daily  . enoxaparin (LOVENOX) injection  40 mg Subcutaneous Q24H  . insulin aspart  0-15 Units Subcutaneous TID WC  . insulin detemir  12 Units Subcutaneous BID  . mouth rinse  15 mL Mouth Rinse BID  . midodrine  5 mg Oral TID WC  . mometasone-formoterol  2 puff Inhalation BID  . nicotine  14 mg Transdermal QHS  . pantoprazole  40 mg Oral Daily  . polyethylene glycol  17 g Oral Daily  . rosuvastatin  40 mg Oral q1800  . spironolactone  25 mg Oral Daily  . torsemide  20 mg Oral Daily  . warfarin   Does not apply Once  . Warfarin - Pharmacist Dosing Inpatient   Does not apply q1800   Continuous Infusions: . sodium chloride 10 mL/hr at 03/28/16 0600   PRN Meds:.acetaminophen **OR** acetaminophen, ondansetron **OR** ondansetron (ZOFRAN) IV, oxyCODONE, traMADol  Xrays No results found.  Assessment/Plan: S/P Procedure(s) (LRB): 1st and 2nd Ray Amputation Right Foot (Right)  1 doing well 2 right foot per ortho 3 cont HF management per team- tolerating being  off milrinone well clinically 4 increase insulin dose as sugars pretty variable 5 poss home in am, concern about need for further tx of foot      LOS: 19 days    GOLD,WAYNE E 04/01/2016 Patient seen and examined, agree with above May need additional debridement on right foot  Indiya Izquierdo C. Roxan Hockey, MD [facility] [phone]

## 2016-04-01 NOTE — Progress Notes (Signed)
ANTICOAGULATION CONSULT NOTE - Follow Up Consult  Pharmacy Consult for Coumadin Indication: prosthetic material used to repair post-MI VSD  Allergies  Allergen Reactions  . Morphine And Related Other (See Comments)    UNSPECIFIED REACTION "Pt said it was too much"     Patient Measurements: Height: 5\' 5"  (165.1 cm) Weight: 144 lb 10 oz (65.6 kg) IBW/kg (Calculated) : 61.5  Vital Signs: Temp: 98.6 F (37 C) (10/01 0543) Temp Source: Oral (10/01 0543) BP: 86/49 (10/01 0543) Pulse Rate: 95 (10/01 0920)  Labs:  Recent Labs  03/30/16 0430 03/31/16 0426 04/01/16 0405  HGB 8.4*  --  8.9*  HCT 26.1*  --  27.8*  PLT 311  --  327  LABPROT 16.0* 17.5* 20.7*  INR 1.27 1.42 1.75  CREATININE 1.24 1.58* 1.37*    Estimated Creatinine Clearance: 47.4 mL/min (by C-G formula based on SCr of 1.37 mg/dL (H)).  Assessment: 64yom s/p STEMI and repair of post-MI VSD was started on coumadin 9/27.  INR today = 1.75  Goal of Therapy:  INR 2-3 Monitor platelets by anticoagulation protocol: Yes   Plan:  1) Repeat coumadin 7.5mg  x 1 2) Daily INR 3) Continue lovenox 40 sq q24 until INR therapeutic  Thank you Anette Guarneri, PharmD [phone] 04/01/2016,12:14 PM

## 2016-04-01 NOTE — Progress Notes (Signed)
Call received from ortho on call attending.  Per attending, he will relay need for Pt to be assessed to Dr. Sharol Given and will request that Dr. Sharol Given see Pt on Monday.  Information relayed to charge nurse.  Will cont to monitor.

## 2016-04-01 NOTE — Progress Notes (Signed)
Call placed to [facility] ortho on call service.  Notified on call service of status of Pt amputation site.  On call service to page on call physician.  Await call back.

## 2016-04-02 LAB — CARBOXYHEMOGLOBIN
CARBOXYHEMOGLOBIN: 1.8 % — AB (ref 0.5–1.5)
Methemoglobin: 0.5 % (ref 0.0–1.5)
O2 SAT: 55.8 %
Total hemoglobin: 9.5 g/dL — ABNORMAL LOW (ref 12.0–16.0)

## 2016-04-02 LAB — BASIC METABOLIC PANEL
ANION GAP: 9 (ref 5–15)
BUN: 39 mg/dL — ABNORMAL HIGH (ref 6–20)
CHLORIDE: 98 mmol/L — AB (ref 101–111)
CO2: 29 mmol/L (ref 22–32)
CREATININE: 1.41 mg/dL — AB (ref 0.61–1.24)
Calcium: 9.4 mg/dL (ref 8.9–10.3)
GFR calc non Af Amer: 51 mL/min — ABNORMAL LOW (ref >60)
GFR, EST AFRICAN AMERICAN: 59 mL/min — AB (ref >60)
Glucose, Bld: 106 mg/dL — ABNORMAL HIGH (ref 65–99)
Potassium: 4.2 mmol/L (ref 3.5–5.1)
Sodium: 136 mmol/L (ref 135–145)

## 2016-04-02 LAB — GLUCOSE, CAPILLARY
GLUCOSE-CAPILLARY: 203 mg/dL — AB (ref 65–99)
Glucose-Capillary: 208 mg/dL — ABNORMAL HIGH (ref 65–99)
Glucose-Capillary: 179 mg/dL — ABNORMAL HIGH (ref 65–99)

## 2016-04-02 LAB — PROTIME-INR
INR: 1.88
Prothrombin Time: 21.9 seconds — ABNORMAL HIGH (ref 11.4–15.2)

## 2016-04-02 MED ORDER — TORSEMIDE 20 MG PO TABS: 20.0000 mg | ORAL_TABLET | Freq: Every day | ORAL | 1 refills | Status: DC

## 2016-04-02 MED ORDER — ASPIRIN EC 81 MG PO TBEC: 81.0000 mg | DELAYED_RELEASE_TABLET | Freq: Every day | ORAL | Status: DC

## 2016-04-02 MED ORDER — MIDODRINE HCL 5 MG PO TABS: 5.0000 mg | ORAL_TABLET | Freq: Three times a day (TID) | ORAL | 1 refills | Status: DC

## 2016-04-02 MED ORDER — ROSUVASTATIN CALCIUM 40 MG PO TABS: 40.0000 mg | ORAL_TABLET | Freq: Every day | ORAL | 0 refills | Status: DC

## 2016-04-02 MED ORDER — ASPIRIN EC 81 MG PO TBEC
81.0000 mg | DELAYED_RELEASE_TABLET | Freq: Every day | ORAL | Status: DC
  Administered 2016-04-03: 81 mg via ORAL
  Filled 2016-04-02: qty 1

## 2016-04-02 MED ORDER — SPIRONOLACTONE 25 MG PO TABS: 25.0000 mg | ORAL_TABLET | Freq: Every day | ORAL | 1 refills | Status: DC

## 2016-04-02 MED ORDER — ALBUTEROL SULFATE (2.5 MG/3ML) 0.083% IN NEBU: 2.5000 mg | INHALATION_SOLUTION | Freq: Four times a day (QID) | RESPIRATORY_TRACT | Status: DC | PRN

## 2016-04-02 MED ORDER — WARFARIN SODIUM 7.5 MG PO TABS
7.5000 mg | ORAL_TABLET | Freq: Once | ORAL | Status: AC
  Administered 2016-04-02: 7.5 mg via ORAL
  Filled 2016-04-02: qty 1

## 2016-04-02 MED ORDER — NICOTINE 14 MG/24HR TD PT24: 14.0000 mg | MEDICATED_PATCH | Freq: Every day | TRANSDERMAL | 0 refills | Status: DC

## 2016-04-02 MED ORDER — OXYCODONE HCL 5 MG PO TABS: 5.0000 mg | ORAL_TABLET | ORAL | 0 refills | Status: DC | PRN

## 2016-04-02 MED ORDER — ASPIRIN 81 MG PO TBEC: 81.0000 mg | DELAYED_RELEASE_TABLET | Freq: Every day | ORAL | Status: DC

## 2016-04-02 MED ORDER — WARFARIN SODIUM 5 MG PO TABS: 5.0000 mg | ORAL_TABLET | Freq: Once | ORAL | 1 refills | Status: DC

## 2016-04-02 MED ORDER — DIGOXIN 125 MCG PO TABS: 0.1250 mg | ORAL_TABLET | Freq: Every day | ORAL | 1 refills | Status: DC

## 2016-04-02 NOTE — Progress Notes (Signed)
Patient ID: William [name], male   DOB: [birth year], 65 y.o.   MRN: [id number] P   Advanced Heart Failure Rounding Note   Subjective:    Asked by Dr Prescott Gum to follow for cardiogenic shock and marked volume overload. He is S/P CABG x1 and VSD repair 03/13/16  Underwent R 1st and 2nd toe amp 9/23.   Feels great today.  Excited to go home.  No DOE working with PT.    Creatinine remains stable on po torsemide.   BP stable, though still slightly soft on midodrine 5 mg TID.   Wound vac taken off right foot, has some black eschar at edge of surgical site, back toe tip noted.  [name] has seen and plans outpatient follow up.   Objective:   Weight Range:  Vital Signs:   Temp:  [97.4 F (36.3 C)-98.2 F (36.8 C)] 98.2 F (36.8 C) (10/02 0500) Pulse Rate:  [90-99] 90 (10/02 0500) Resp:  [16-18] 18 (10/02 0500) BP: (85-86)/(51-55) 86/55 (10/02 0500) SpO2:  [97 %-100 %] 99 % (10/02 0500) Weight:  [142 lb 4.8 oz (64.5 kg)] 142 lb 4.8 oz (64.5 kg) (10/02 0500) Last BM Date: 04/01/16  Weight change: Filed Weights   03/31/16 0355 04/01/16 0543 04/02/16 0500  Weight: 148 lb 14.4 oz (67.5 kg) 144 lb 10 oz (65.6 kg) 142 lb 4.8 oz (64.5 kg)    Intake/Output:   Intake/Output Summary (Last 24 hours) at 04/02/16 0842 Last data filed at 04/01/16 1700  Gross per 24 hour  Intake              580 ml  Output                0 ml  Net              580 ml    Physical Exam: General:  Lying in bed HEENT: Normal Neck: supple. JVP 7-8. Carotids 2+ bilat; no bruits. No thyromegaly or nodule noted.   Cor: PMI nondisplaced. RRR. No M/G/R Lungs: CTAB, normal effort Abdomen: soft, NT, ND, no HSM. No bruits or masses. +BS  Extremities: no cyanosis, clubbing, rash, Minimal ankle edema. SCDs in place. Wound vac off, black toe tip noted right foot and black eschar along edge of surgical site.  Neuro: A&O x3, cranial nerves grossly intact. moves all 4 extremities w/o difficulty. Affect  pleasant  Telemetry: Reviewed, NSR 90s  Labs: Basic Metabolic Panel:  Recent Labs Lab 03/29/16 0410 03/30/16 0430 03/30/16 0833 03/31/16 0426 04/01/16 0405 04/02/16 0500  NA 131* 133*  --  135 134* 136  K 4.6 4.7  --  4.7 4.3 4.2  CL 97* 100*  --  99* 96* 98*  CO2 28 26  --  27 29 29   GLUCOSE 145* 134*  --  143* 117* 106*  BUN 36* 31*  --  34* 36* 39*  CREATININE 1.30* 1.24  --  1.58* 1.37* 1.41*  CALCIUM 9.1 9.0  --  9.4 9.2 9.4  MG  --   --  2.2  --   --   --     Liver Function Tests: No results for input(s): AST, ALT, ALKPHOS, BILITOT, PROT, ALBUMIN in the last 168 hours. No results for input(s): LIPASE, AMYLASE in the last 168 hours. No results for input(s): AMMONIA in the last 168 hours.  CBC:  Recent Labs Lab 03/30/16 0430 04/01/16 0405  WBC 9.0 7.6  HGB 8.4* 8.9*  HCT 26.1* 27.8*  MCV  87.6 88.0  PLT 311 327    Cardiac Enzymes: No results for input(s): CKTOTAL, CKMB, CKMBINDEX, TROPONINI in the last 168 hours.  BNP: BNP (last 3 results) No results for input(s): BNP in the last 8760 hours.  ProBNP (last 3 results) No results for input(s): PROBNP in the last 8760 hours.    Other results:  Imaging: No results found.   Medications:     Scheduled Medications: . amiodarone  200 mg Oral Daily  . aspirin EC  325 mg Oral Daily   Or  . aspirin  324 mg Per Tube Daily  . bisacodyl  10 mg Oral Daily  . digoxin  0.125 mg Oral Daily  . docusate sodium  200 mg Oral Daily  . enoxaparin (LOVENOX) injection  40 mg Subcutaneous Q24H  . insulin aspart  0-15 Units Subcutaneous TID WC  . insulin detemir  18 Units Subcutaneous BID  . mouth rinse  15 mL Mouth Rinse BID  . midodrine  5 mg Oral TID WC  . mometasone-formoterol  2 puff Inhalation BID  . nicotine  14 mg Transdermal QHS  . pantoprazole  40 mg Oral Daily  . polyethylene glycol  17 g Oral Daily  . rosuvastatin  40 mg Oral q1800  . spironolactone  25 mg Oral Daily  . torsemide  20 mg Oral  Daily  . warfarin   Does not apply Once  . Warfarin - Pharmacist Dosing Inpatient   Does not apply q1800    Infusions: . sodium chloride 10 mL/hr at 03/28/16 0600    PRN Medications: acetaminophen **OR** acetaminophen, ondansetron **OR** ondansetron (ZOFRAN) IV, oxyCODONE, traMADol   Assessment:   1. Inferiro STEMI CAD-->S/P CABG x1 VSD Repair 2. Cardiogenic Shock: EF 40-45% on 9/17 echo.  3. Ischemic R Great Toe s/p amputation 9/22 4. Hyperlipidemia 5. DM Type II 6. PAF 7. Hypoalbumin-2.3  8. AKI 9. Hypokalemia/hyponatremia   Plan/Discussion:    S/P CABG x1 VSD repair 03/13/16  Stable from from cardiac standpoint. BP remains low in upper 80s, but stable overall.  Continue torsemide 20 mg daily. Continue digoxin.  Leave off milrinone.  Will check coox this morning to decide whether to pull PICC line or leave for one week and recheck coox as outpatient.  Has been stable clinically thus far off milrinone.   Hold off on ACEI/ARB with hypotension and recent AKI.   No further AF.  Continue Amio 200 mg daily.  Loading coumadin. Dosing per pharm. INR 1.75.  ECG showed mild sinus tachycardia (not atrial tachycardia).   S/p R 1st and 2nd toe amputation. Wound vac off, concern for residual ischemic tissue.  [name] thinks stable and will follow up in 1 week.   Length of Stay: [address]  Annamaria Helling 04/02/2016, 8:42 AM  Advanced Heart Failure Team Pager [phone] (M-F; 7a - 4p)  Please contact [facility] for night-coverage after hours (4p -7a ) and weekends on [email or web address]  Patient seen and examined with Oda Kilts, PA-C. We discussed all aspects of the encounter. I agree with the assessment and plan as stated above.   BP still soft but asymptomatic.  Volume status improved. Maintaining NSR. Will check co-ox now. If ok, agree with potential d/c today with close f/u in HF clinic.   Bensimhon, Daniel,MD 9:50 AM

## 2016-04-02 NOTE — Progress Notes (Signed)
PICC line removed earlier in the day for discharge. Per Dr. Prescott Gum pt may remain overnight without IV.   Fritz Pickerel, RN

## 2016-04-02 NOTE — Care Management Note (Signed)
Case Management Note Marvetta Gibbons RN, BSN Unit 2W-Case Manager [phone]  Patient Details  Name: William [name] MRN: [id number] Date of Birth: 16-Nov-1950  Subjective/Objective:   Pt admitted MI s/p CABG and 1st and 2nd Ray Amputation Right Foot (Right)                 Action/Plan: PTA pt lived at home with family- plan is to d/c home with family and [facility] services- per pt choice referral has already been made to [facility] by previous CM- confirmed with pt at bedside that [facility] was agency of choice- orders for HHRN/PT placed by MD- spoke with Santiago Glad at [facility] regarding [place] orders and pt discharge for today. Per pt he also needs DME-3n1, shower chair and wheel chair- (pt reports that his current w/c is too big for the home and needs [name] w/c). Have asked PA to placed DME orders- call made to [facility] with [facility] regarding DME needs- DME to be delivered to room prior to discharge.   Expected Discharge Date: 04/02/16              Expected Discharge Plan:  [place]  In-House Referral:     Discharge planning Services  CM Consult  Post Acute Care Choice:  Home Health, Durable Medical Equipment Choice offered to:  Patient  DME Arranged:  3-N-1, Shower stool, Wheelchair manual DME Agency:     HH Arranged:  PT, RN [place] Agency:  [facility]  Status of Service:  Completed, signed off  If discussed at [place] of Stay Meetings, dates discussed:    Discharge Disposition: Home with Home Health   Additional Comments:  Dawayne Patricia, RN 04/02/2016, 2:31 PM

## 2016-04-02 NOTE — Progress Notes (Signed)
Orthopedic Tech Progress Note Patient Details:  William [name] [birth year] [id number]  Ortho Devices Type of Ortho Device: Darco shoe Ortho Device/Splint Location: RLE Ortho Device/Splint Interventions: Application   [name] 04/02/2016, 1:21 PM

## 2016-04-02 NOTE — Care Management Important Message (Signed)
Important Message  Patient Details  Name: William [name] MRN: [id number] Date of Birth: 1950-11-19   Medicare Important Message Given:  Yes    Nathen May 04/02/2016, 12:24 PM

## 2016-04-02 NOTE — Progress Notes (Signed)
Patient ID: William [name], male   DOB: 1951-05-28, 65 y.o.   MRN: [id number] Patient is status post right foot first and second Ray amputation. Examination patient incision looks good. He has some mild ischemic changes there is no cellulitis no purulence no wound dehiscence. There is some mild ischemic changes to the third toe. I feel that this should progress and do well. Patient will continue with nonweightbearing on the right he may wash the foot with soap and water daily dry dressing change daily I will follow-up in the office in 1 week.

## 2016-04-02 NOTE — Progress Notes (Signed)
ANTICOAGULATION CONSULT NOTE - Follow Up Consult  Pharmacy Consult for Coumadin Indication: prosthetic material used to repair post-MI VSD  Allergies  Allergen Reactions  . Morphine And Related Other (See Comments)    UNSPECIFIED REACTION "Pt said it was too much"     Patient Measurements: Height: 5\' 5"  (165.1 cm) Weight: 142 lb 4.8 oz (64.5 kg) IBW/kg (Calculated) : 61.5  Vital Signs: Temp: 98.2 F (36.8 C) (10/02 0500) Temp Source: Oral (10/02 0500) BP: 86/55 (10/02 0500) Pulse Rate: 90 (10/02 0500)  Labs:  Recent Labs  03/31/16 0426 04/01/16 0405 04/02/16 0500  HGB  --  8.9*  --   HCT  --  27.8*  --   PLT  --  327  --   LABPROT 17.5* 20.7* 21.9*  INR 1.42 1.75 1.88  CREATININE 1.58* 1.37* 1.41*    Estimated Creatinine Clearance: 46 mL/min (by C-G formula based on SCr of 1.41 mg/dL (H)).  Assessment: 64yom s/p STEMI and repair of post-MI VSD was started on coumadin 9/27.  INR today = 1.88  Goal of Therapy:  INR 2-3 Monitor platelets by anticoagulation protocol: Yes   Plan:  1) Repeat coumadin 7.5mg  x 1 2) Daily INR 3) Continue lovenox 40 sq q24 until INR > 2  Bonnita Nasuti Pharm.D. CPP, BCPS Clinical Pharmacist [phone] 04/02/2016 1:15 PM  ,c

## 2016-04-02 NOTE — Progress Notes (Signed)
Physical Therapy Treatment Patient Details Name: William [name] MRN: [id number] DOB: Apr 26, 1951 Today's Date: 04/02/2016    History of Present Illness 65 yo s/p acute MI with CABGx1 and septal defect repair. On/off levophed due to hypotension. Underwent rt 1st and 2nd toe amputation 9/22 by Dr. Sharol Given. PMHx: DM, smoker, HTN, HLD, right great toe gangrene    PT Comments    Pt has response today with NWB from MD now, so will continue to maintain this with pt using a WC at home to prevent the overuse of UE's and RLE.  He is aware of precautions but is not maintaining NWB on RLE and has lost his shoe.  Have nursing sending up another to him to avoid having the RLE injured with his mobility.  Continue acutely until DC on attempts to walk and transfer training.  Follow Up Recommendations  Home health PT;Supervision for mobility/OOB     Equipment Recommendations  None recommended by PT    Recommendations for Other Services Rehab consult     Precautions / Restrictions Precautions Precautions: Sternal;Fall Precaution Comments: pt able to state and generalize all precautions  Required Braces or Orthoses: Other Brace/Splint Other Brace/Splint: darco shoe rt foot Restrictions Weight Bearing Restrictions: Yes RLE Weight Bearing: Non weight bearing Other Position/Activity Restrictions: sternal precautions    Mobility  Bed Mobility Overal bed mobility: Modified Independent Bed Mobility: Supine to Sit;Sit to Supine Rolling: Modified independent (Device/Increase time) Sidelying to sit: Supervision Supine to sit: Supervision Sit to supine: Supervision   General bed mobility comments: HOB minimally elevated but is expecting to go home  Transfers Overall transfer level: Needs assistance Equipment used: 1 person hand held assist Transfers: Sit to/from Stand;Stand Pivot Transfers Sit to Stand: Supervision Stand pivot transfers: Min guard       General transfer comment: struggling to  maintain both sets of precautions, has too much pressure on UE's to transition with RLE avoiding WBing  Ambulation/Gait             General Gait Details: not attempted, pt declined   Stairs            Wheelchair Mobility    Modified Rankin (Stroke Patients Only)       Balance   Sitting-balance support: Single extremity supported Sitting balance-Leahy Scale: Good     Standing balance support: Bilateral upper extremity supported Standing balance-Leahy Scale: Poor Standing balance comment: cannot avoid UE support as his LLE is doing all the balancing to pivot to [facility]                    Cognition Arousal/Alertness: Awake/alert Behavior During Therapy: Impulsive (needed reminding not to put pressure on R foot) Overall Cognitive Status: No family/caregiver present to determine baseline cognitive functioning       Memory: Decreased recall of precautions              Exercises      General Comments General comments (skin integrity, edema, etc.): Pt's Darco shoe is missing, left nursing the message and nursing has ordered another.  He is also benefitted by allowing Heel WBing on RLE in shoe and so left another message with surgeon to add to PT message previously.      Pertinent Vitals/Pain Pain Assessment: Faces Faces Pain Scale: Hurts little more Pain Location: R foot Pain Descriptors / Indicators: Operative site guarding Pain Intervention(s): Limited activity within patient's tolerance;Monitored during session;Repositioned    Home Living  Prior Function            PT Goals (current goals can now be found in the care plan section) Acute Rehab PT Goals Patient Stated Goal: to get home today PT Goal Formulation: With patient Potential to Achieve Goals: Good Progress towards PT goals: Progressing toward goals    Frequency    Min 3X/week      PT Plan Current plan remains appropriate    Co-evaluation              End of Session Equipment Utilized During Treatment: Other (comment) (BSC) Activity Tolerance: Patient tolerated treatment well;Patient limited by fatigue;Patient limited by pain Patient left: in bed;with call bell/phone within reach;with bed alarm set     Time: [id number] PT Time Calculation (min) (ACUTE ONLY): 26 min  Charges:  $Therapeutic Activity: 23-37 mins                    G CodesRamond Dial 05-02-16, 12:48 PM    Mee Hives, PT MS Acute Rehab Dept. Number: [place]

## 2016-04-02 NOTE — Progress Notes (Addendum)
      [address]       [place] [address]             [phone]        10 Days Post-Op Procedure(s) (LRB): 1st and 2nd Ray Amputation Right Foot (Right)  Subjective: Patient without complaints. He hopes to go home.  Objective: Vital signs in last 24 hours: Temp:  [97.6 F (36.4 C)-98.2 F (36.8 C)] 97.7 F (36.5 C) (10/02 1317) Pulse Rate:  [90-100] 100 (10/02 1317) Cardiac Rhythm: Normal sinus rhythm (10/02 0700) Resp:  [16-18] 18 (10/02 1317) BP: (86-104)/(55-62) 104/62 (10/02 1317) SpO2:  [96 %-100 %] 96 % (10/02 1317) Weight:  [142 lb 4.8 oz (64.5 kg)] 142 lb 4.8 oz (64.5 kg) (10/02 0500)   Current Weight  04/02/16 142 lb 4.8 oz (64.5 kg)    Intake/Output from previous day: 10/01 0701 - 10/02 0700 In: 580 [P.O.:580] Out: -    Physical Exam:  Cardiovascular: RRR Pulmonary: Clear to auscultation bilaterally Abdomen: Soft, non tender, bowel sounds present. Extremities: No lower extremity edema. Wounds: Clean and dry.  No erythema or signs of infection. Dressing on right foot with blood ooze.  Lab Results: CBC: Recent Labs  04/01/16 0405  WBC 7.6  HGB 8.9*  HCT 27.8*  PLT 327   BMET:  Recent Labs  04/01/16 0405 04/02/16 0500  NA 134* 136  K 4.3 4.2  CL 96* 98*  CO2 29 29  GLUCOSE 117* 106*  BUN 36* 39*  CREATININE 1.37* 1.41*  CALCIUM 9.2 9.4    PT/INR:  Lab Results  Component Value Date   INR 1.88 04/02/2016   INR 1.75 04/01/2016   INR 1.42 03/31/2016   ABG:  INR: Will add last result for INR, ABG once components are confirmed Will add last 4 CBG results once components are confirmed  Assessment/Plan:  1. CV - SR in the 90's. On Amiodarone 200 mg daily, Digoxin 0.125 mg daily, midodrine 5 mg tid, Spironolactone 25 mg daily,  ecasa 325 mg and Coumadin. INR slightly increased from 1.75 to 1.88. Will send home on Coumadin 5 mg. Will decrease 81 mg daily. Co Ox this am 55.8 2.  Pulmonary - On room air. 3.  Creatinine slightly increased from 1.37 to 1.41. On Torsemide 20 mg daily. 4.  Acute blood loss anemia - Last H and H 8.9 and 27.8 5. S/p right foot 1st and 2nd ray amputation-per Dr. Sharol Given, to continue non weight bearing  6. DM-CBGs 210/131/203. Was on Metformin pre op but not on secondary to elevated creatinine. On Insulin only. Pre op HGA1C 10.7 7. Remove PICC line 8. As discussed  with Dr. Prescott Gum, will discharge in am  ZIMMERMAN,DONIELLE MPA-C 04/02/2016,1:59 PM   Home services in place for discharge in a.m. Plan Coumadin for 6 weeks goal INR 1.8-2.2 Plan discharge home on Coumadin 5 mg patient examined and medical record reviewed,agree with above note. Tharon Aquas Trigt III 04/02/2016

## 2016-04-03 LAB — GLUCOSE, CAPILLARY: GLUCOSE-CAPILLARY: 135 mg/dL — AB (ref 65–99)

## 2016-04-03 LAB — PROTIME-INR
INR: 2.14
Prothrombin Time: 24.2 seconds — ABNORMAL HIGH (ref 11.4–15.2)

## 2016-04-03 MED ORDER — WARFARIN SODIUM 2.5 MG PO TABS
2.5000 mg | ORAL_TABLET | Freq: Every day | ORAL | Status: DC
  Administered 2016-04-03: 2.5 mg via ORAL
  Filled 2016-04-03: qty 1

## 2016-04-03 NOTE — Progress Notes (Addendum)
      [address]       [place] [address]             [phone]        11 Days Post-Op Procedure(s) (LRB): 1st and 2nd Ray Amputation Right Foot (Right)  Subjective: Patient without complaints. He is looking forward to going home.  Objective: Vital signs in last 24 hours: Temp:  [97.7 F (36.5 C)-98.3 F (36.8 C)] 97.7 F (36.5 C) (10/03 0500) Pulse Rate:  [91-100] 91 (10/03 0500) Cardiac Rhythm: Normal sinus rhythm (10/02 1900) Resp:  [18] 18 (10/03 0500) BP: (99-114)/(55-62) 99/55 (10/03 0500) SpO2:  [96 %-100 %] 97 % (10/03 0500) Weight:  [150 lb 1.6 oz (68.1 kg)] 150 lb 1.6 oz (68.1 kg) (10/03 0500)   Current Weight  04/03/16 150 lb 1.6 oz (68.1 kg)    Intake/Output from previous day: 10/02 0701 - 10/03 0700 In: 720 [P.O.:720] Out: 2200 [Urine:2200]   Physical Exam:  Cardiovascular: RRR Pulmonary: Clear to auscultation bilaterally Abdomen: Soft, non tender, bowel sounds present. Extremities: No lower extremity edema. Wounds: Clean and dry.  No erythema or signs of infection. Dressing on right foot mostly dry.  Lab Results: CBC:  Recent Labs  04/01/16 0405  WBC 7.6  HGB 8.9*  HCT 27.8*  PLT 327   BMET:   Recent Labs  04/01/16 0405 04/02/16 0500  NA 134* 136  K 4.3 4.2  CL 96* 98*  CO2 29 29  GLUCOSE 117* 106*  BUN 36* 39*  CREATININE 1.37* 1.41*  CALCIUM 9.2 9.4    PT/INR:  Lab Results  Component Value Date   INR 2.14 04/03/2016   INR 1.88 04/02/2016   INR 1.75 04/01/2016   ABG:  INR: Will add last result for INR, ABG once components are confirmed Will add last 4 CBG results once components are confirmed  Assessment/Plan:  1. CV - SR in the 90's. On Amiodarone 200 mg daily, Digoxin 0.125 mg daily, midodrine 5 mg tid, Spironolactone 25 mg daily,  ecasa 325 mg and Coumadin. INR increased from 1.88 to 2.14. Will send home on Coumadin 5 mg, but will only give 2.5 tonight. Will decrease 81 mg daily.  2.   Pulmonary - On room air. 3. Creatinine slightly increased from 1.37 to 1.41. On Torsemide 20 mg daily. 4.  Acute blood loss anemia - Last H and H 8.9 and 27.8 5. S/p right foot 1st and 2nd ray amputation-per Dr. Sharol Given, to continue non weight bearing  6. DM-CBGs 208/179/135. Was on Metformin pre op but not on secondary to elevated creatinine. Will not restart Invokana or Metformin. On Insulin only. Pre op HGA1C 10.7. Will need close medical follow up after discharge. 7. Discharge  Adrienna Karis MPA-C 04/03/2016,7:23 AM

## 2016-04-03 NOTE — Progress Notes (Signed)
Patient ID: Natalio Bustillos, male   DOB: [birth year], 65 y.o.   MRN: [id number] P   Advanced Heart Failure Rounding Note   Subjective:    Asked by Dr Prescott Gum to follow for cardiogenic shock and marked volume overload. He is S/P CABG x1 and VSD repair 03/13/16  Underwent R 1st and 2nd toe amp 9/23.   Coox 55.8% 04/02/16 OFF mirlinone.  PICC line now removed.   Creatinine stable on po torsemide. BP remains soft, but stable on 5 mg midodrine TID.   Feeling good today.  Excited about going home.   Objective:   Weight Range:  Vital Signs:   Temp:  [97.7 F (36.5 C)-98.3 F (36.8 C)] 97.7 F (36.5 C) (10/03 0500) Pulse Rate:  [91-100] 91 (10/03 0500) Resp:  [18] 18 (10/03 0500) BP: (99-114)/(55-62) 99/55 (10/03 0500) SpO2:  [96 %-100 %] 99 % (10/03 0728) Weight:  [150 lb 1.6 oz (68.1 kg)] 150 lb 1.6 oz (68.1 kg) (10/03 0500) Last BM Date: 04/02/16  Weight change: Filed Weights   04/01/16 0543 04/02/16 0500 04/03/16 0500  Weight: 144 lb 10 oz (65.6 kg) 142 lb 4.8 oz (64.5 kg) 150 lb 1.6 oz (68.1 kg)    Intake/Output:   Intake/Output Summary (Last 24 hours) at 04/03/16 0744 Last data filed at 04/03/16 0129  Gross per 24 hour  Intake              720 ml  Output             2200 ml  Net            -1480 ml    Physical Exam: General:  Lying in bed.  HEENT: Normal Neck: supple. JVP ~7 cm. Carotids 2+ bilat; no bruits. No thyromegaly or lymphadenopathy appreciated.    Cor: PMI nondisplaced. RRR. No M/G/R Lungs: Clear, no increased effort Abdomen: soft, NT, ND, no HSM. No bruits or masses. +BS  Extremities: no cyanosis, clubbing, rash, or edema. Wound vac OFF, black toe tip noted right foot and black eschar along edge of surgical site.  Neuro: A&O x3, cranial nerves grossly intact. moves all 4 extremities w/o difficulty. Affect pleasant  Telemetry: Reviewed personally, NSR 90s  Labs: Basic Metabolic Panel:  Recent Labs Lab 03/29/16 0410 03/30/16 0430 03/30/16 0833  03/31/16 0426 04/01/16 0405 04/02/16 0500  NA 131* 133*  --  135 134* 136  K 4.6 4.7  --  4.7 4.3 4.2  CL 97* 100*  --  99* 96* 98*  CO2 28 26  --  27 29 29   GLUCOSE 145* 134*  --  143* 117* 106*  BUN 36* 31*  --  34* 36* 39*  CREATININE 1.30* 1.24  --  1.58* 1.37* 1.41*  CALCIUM 9.1 9.0  --  9.4 9.2 9.4  MG  --   --  2.2  --   --   --     Liver Function Tests: No results for input(s): AST, ALT, ALKPHOS, BILITOT, PROT, ALBUMIN in the last 168 hours. No results for input(s): LIPASE, AMYLASE in the last 168 hours. No results for input(s): AMMONIA in the last 168 hours.  CBC:  Recent Labs Lab 03/30/16 0430 04/01/16 0405  WBC 9.0 7.6  HGB 8.4* 8.9*  HCT 26.1* 27.8*  MCV 87.6 88.0  PLT 311 327    Cardiac Enzymes: No results for input(s): CKTOTAL, CKMB, CKMBINDEX, TROPONINI in the last 168 hours.  BNP: BNP (last 3 results) No results for input(s):  BNP in the last 8760 hours.  ProBNP (last 3 results) No results for input(s): PROBNP in the last 8760 hours.    Other results:  Imaging: No results found.   Medications:     Scheduled Medications: . amiodarone  200 mg Oral Daily  . aspirin EC  81 mg Oral Daily  . bisacodyl  10 mg Oral Daily  . digoxin  0.125 mg Oral Daily  . docusate sodium  200 mg Oral Daily  . insulin aspart  0-15 Units Subcutaneous TID WC  . insulin detemir  18 Units Subcutaneous BID  . mouth rinse  15 mL Mouth Rinse BID  . midodrine  5 mg Oral TID WC  . mometasone-formoterol  2 puff Inhalation BID  . nicotine  14 mg Transdermal QHS  . pantoprazole  40 mg Oral Daily  . polyethylene glycol  17 g Oral Daily  . rosuvastatin  40 mg Oral q1800  . spironolactone  25 mg Oral Daily  . torsemide  20 mg Oral Daily  . warfarin  2.5 mg Oral q1800  . warfarin   Does not apply Once  . Warfarin - Pharmacist Dosing Inpatient   Does not apply q1800    Infusions: . sodium chloride 10 mL/hr at 03/28/16 0600    PRN Medications: acetaminophen **OR**  acetaminophen, albuterol, ondansetron **OR** ondansetron (ZOFRAN) IV, oxyCODONE, traMADol   Assessment:   1. Inferiro STEMI CAD-->S/P CABG x1 VSD Repair 2. Cardiogenic Shock: EF 40-45% on 9/17 echo.  3. Ischemic R Great Toe s/p amputation 9/22 4. Hyperlipidemia 5. DM Type II 6. PAF 7. Hypoalbumin-2.3  8. AKI 9. Hypokalemia/hyponatremia   Plan/Discussion:    S/P CABG x1 VSD repair 03/13/16  Coox stable at 55.8% 04/02/16 off milrinone.  PICC line removed.  Stable from HF standpoint.  BP remains soft, continue midodrine.   Continue torsemide 20 mg daily. Continue digoxin. Check level at outpatient follow up.     Hold off on ACEI/ARB with hypotension and recent AKI.   No further Afib. Continue Amio 200 mg daily. INR 2.1.  Dosing per primary team.  S/p R 1st and 2nd toe amputation. Wound vac off, concern for residual ischemic tissue.  [name] thinks stable and will follow up in 1 week.   Has follow up scheduled in HF clinic 04/09/16.   Length of Stay: [address]  Annamaria Helling 04/03/2016, 7:44 AM  Advanced Heart Failure Team Pager [phone] (M-F; 7a - 4p)  Please contact [facility] for night-coverage after hours (4p -7a ) and weekends on [email or web address]   BP improved..  Volume status improved. Maintaining NSR. Co-ox ok. Agree with d/c today. Has close f/u in HF clinic.   Lya Holben,MD 2:41 PM

## 2016-04-03 NOTE — Progress Notes (Signed)
Occupational Therapy Treatment Patient Details Name: William [name] MRN: [id number] DOB: 10-30-1950 Today's Date: 04/03/2016    History of present illness 65 yo s/p acute MI with CABGx1 and septal defect repair. On/off levophed due to hypotension. Underwent rt 1st and 2nd toe amputation 9/22 by Dr. Sharol Given. PMHx: DM, smoker, HTN, HLD, right great toe gangrene   OT comments  Reviewed all precautions and ADL strategies with AE and DME. Pt eager to go home.  Follow Up Recommendations  No OT follow up    Equipment Recommendations  3 in 1 bedside comode;Tub/shower bench    Recommendations for Other Services      Precautions / Restrictions Precautions Precautions: Sternal;Fall Precaution Comments: pt able to state and generalize all precautions  Required Braces or Orthoses: Other Brace/Splint Other Brace/Splint: darco shoe rt foot Restrictions Weight Bearing Restrictions: Yes RLE Weight Bearing: Non weight bearing Other Position/Activity Restrictions: sternal precautions       Mobility Bed Mobility Overal bed mobility: Modified Independent                Transfers                      Balance     Sitting balance-Leahy Scale: Good                             ADL Overall ADL's : Needs assistance/impaired     Grooming: Wash/dry hands;Wash/dry face;Oral care;Sitting;Set up               Lower Body Dressing: Supervision/safety;Sitting/lateral leans Lower Body Dressing Details (indicate cue type and reason): with reacher and sock aide, assist for shoe and darco           Tub/Shower Transfer Details (indicate cue type and reason): instructed in transfer over edge of tub to shower seat without stepping over wall of tub Functional mobility during ADLs:  (pt aware he may not propel his w/c)        Vision                     Perception     Praxis      Cognition   Behavior During Therapy: Impulsive (verbalizes, but does not  generalize WB precautions on foot) Overall Cognitive Status: Impaired/Different from baseline Area of Impairment: Memory     Memory: Decreased recall of precautions          General Comments: reviewed precautions and use of 3 in 1 in front of sink for sponge bathing, beside bed at night and over toilet during the day if w/c fits in bathroom    Extremity/Trunk Assessment               Exercises     Shoulder Instructions       General Comments      Pertinent Vitals/ Pain       Pain Assessment: No/denies pain  Home Living                                          Prior Functioning/Environment              Frequency  Min 2X/week        Progress Toward Goals  OT Goals(current goals can now be found in the care plan section)  Progress towards OT goals: Progressing toward goals  Acute Rehab OT Goals Patient Stated Goal: to get home today Time For Goal Achievement: 04/03/16 Potential to Achieve Goals: Good  Plan Discharge plan remains appropriate    Co-evaluation                 End of Session     Activity Tolerance Patient tolerated treatment well   Patient Left in bed;with call bell/phone within reach   Nurse Communication          Time: [id number] OT Time Calculation (min): 16 min  Charges: OT General Charges $OT Visit: 1 Procedure OT Treatments $Self Care/Home Management : 8-22 mins  Malka So 04/03/2016, 8:58 AM  [phone]

## 2016-04-03 NOTE — Progress Notes (Signed)
Pt discharged. Discharge instructions gone over with pt and pt's daughter. All questions answered. Demonstrated understanding of medications (coumadin, etc), follow up appointments, and wound care. Pt given paper prescriptions. Pt given phone number to call to find new PCP. Telemetry removed, CCMD notified. PICC line removed yesterday.   Fritz Pickerel, RN

## 2016-04-04 DIAGNOSIS — E785 Hyperlipidemia, unspecified: Secondary | ICD-10-CM | POA: Diagnosis not present

## 2016-04-04 DIAGNOSIS — Z89411 Acquired absence of right great toe: Secondary | ICD-10-CM | POA: Diagnosis not present

## 2016-04-04 DIAGNOSIS — Z794 Long term (current) use of insulin: Secondary | ICD-10-CM | POA: Diagnosis not present

## 2016-04-04 DIAGNOSIS — Z4781 Encounter for orthopedic aftercare following surgical amputation: Secondary | ICD-10-CM | POA: Diagnosis not present

## 2016-04-04 DIAGNOSIS — Z48812 Encounter for surgical aftercare following surgery on the circulatory system: Secondary | ICD-10-CM | POA: Diagnosis not present

## 2016-04-04 DIAGNOSIS — I252 Old myocardial infarction: Secondary | ICD-10-CM | POA: Diagnosis not present

## 2016-04-04 DIAGNOSIS — F17210 Nicotine dependence, cigarettes, uncomplicated: Secondary | ICD-10-CM | POA: Diagnosis not present

## 2016-04-04 DIAGNOSIS — I10 Essential (primary) hypertension: Secondary | ICD-10-CM | POA: Diagnosis not present

## 2016-04-04 DIAGNOSIS — Z7982 Long term (current) use of aspirin: Secondary | ICD-10-CM | POA: Diagnosis not present

## 2016-04-04 DIAGNOSIS — I2510 Atherosclerotic heart disease of native coronary artery without angina pectoris: Secondary | ICD-10-CM | POA: Diagnosis not present

## 2016-04-04 DIAGNOSIS — E119 Type 2 diabetes mellitus without complications: Secondary | ICD-10-CM | POA: Diagnosis not present

## 2016-04-04 DIAGNOSIS — Z89421 Acquired absence of other right toe(s): Secondary | ICD-10-CM | POA: Diagnosis not present

## 2016-04-05 ENCOUNTER — Telehealth: Payer: Self-pay | Admitting: Cardiovascular Disease

## 2016-04-05 ENCOUNTER — Ambulatory Visit (INDEPENDENT_AMBULATORY_CARE_PROVIDER_SITE_OTHER): Payer: Medicare Other | Admitting: Internal Medicine

## 2016-04-05 DIAGNOSIS — Z89411 Acquired absence of right great toe: Secondary | ICD-10-CM | POA: Diagnosis not present

## 2016-04-05 DIAGNOSIS — I480 Paroxysmal atrial fibrillation: Secondary | ICD-10-CM

## 2016-04-05 DIAGNOSIS — I10 Essential (primary) hypertension: Secondary | ICD-10-CM | POA: Diagnosis not present

## 2016-04-05 DIAGNOSIS — I2510 Atherosclerotic heart disease of native coronary artery without angina pectoris: Secondary | ICD-10-CM | POA: Diagnosis not present

## 2016-04-05 DIAGNOSIS — Z89421 Acquired absence of other right toe(s): Secondary | ICD-10-CM | POA: Diagnosis not present

## 2016-04-05 DIAGNOSIS — Z5181 Encounter for therapeutic drug level monitoring: Secondary | ICD-10-CM

## 2016-04-05 DIAGNOSIS — Z4781 Encounter for orthopedic aftercare following surgical amputation: Secondary | ICD-10-CM | POA: Diagnosis not present

## 2016-04-05 DIAGNOSIS — E119 Type 2 diabetes mellitus without complications: Secondary | ICD-10-CM | POA: Diagnosis not present

## 2016-04-05 LAB — POCT INR: INR: 2.3

## 2016-04-05 NOTE — Telephone Encounter (Signed)
Nurse traci calling from [facility] to call in INR results  INR 2.3 PT 27.4

## 2016-04-06 NOTE — Telephone Encounter (Signed)
Result addressed, see anticoagulation note in Epic.  

## 2016-04-09 ENCOUNTER — Ambulatory Visit (HOSPITAL_COMMUNITY)
Admission: RE | Admit: 2016-04-09 | Discharge: 2016-04-09 | Disposition: A | Discharge status: Home / Self Care | Payer: Medicare Other | Source: Ambulatory Visit | Attending: Student | Admitting: Student

## 2016-04-09 VITALS — BP 110/54 | Wt 146.1 lb

## 2016-04-09 DIAGNOSIS — I480 Paroxysmal atrial fibrillation: Secondary | ICD-10-CM | POA: Insufficient documentation

## 2016-04-09 DIAGNOSIS — I252 Old myocardial infarction: Secondary | ICD-10-CM | POA: Insufficient documentation

## 2016-04-09 DIAGNOSIS — F17210 Nicotine dependence, cigarettes, uncomplicated: Secondary | ICD-10-CM | POA: Diagnosis not present

## 2016-04-09 DIAGNOSIS — I110 Hypertensive heart disease with heart failure: Secondary | ICD-10-CM | POA: Diagnosis not present

## 2016-04-09 DIAGNOSIS — I10 Essential (primary) hypertension: Secondary | ICD-10-CM | POA: Diagnosis not present

## 2016-04-09 DIAGNOSIS — Z951 Presence of aortocoronary bypass graft: Secondary | ICD-10-CM | POA: Diagnosis not present

## 2016-04-09 DIAGNOSIS — E785 Hyperlipidemia, unspecified: Secondary | ICD-10-CM | POA: Insufficient documentation

## 2016-04-09 DIAGNOSIS — Z4781 Encounter for orthopedic aftercare following surgical amputation: Secondary | ICD-10-CM | POA: Diagnosis not present

## 2016-04-09 DIAGNOSIS — E1169 Type 2 diabetes mellitus with other specified complication: Secondary | ICD-10-CM

## 2016-04-09 DIAGNOSIS — Z794 Long term (current) use of insulin: Secondary | ICD-10-CM | POA: Insufficient documentation

## 2016-04-09 DIAGNOSIS — Z7901 Long term (current) use of anticoagulants: Secondary | ICD-10-CM | POA: Diagnosis not present

## 2016-04-09 DIAGNOSIS — I2510 Atherosclerotic heart disease of native coronary artery without angina pectoris: Secondary | ICD-10-CM | POA: Diagnosis not present

## 2016-04-09 DIAGNOSIS — Z7982 Long term (current) use of aspirin: Secondary | ICD-10-CM | POA: Diagnosis not present

## 2016-04-09 DIAGNOSIS — I5022 Chronic systolic (congestive) heart failure: Secondary | ICD-10-CM | POA: Diagnosis not present

## 2016-04-09 DIAGNOSIS — E119 Type 2 diabetes mellitus without complications: Secondary | ICD-10-CM | POA: Insufficient documentation

## 2016-04-09 DIAGNOSIS — Z89411 Acquired absence of right great toe: Secondary | ICD-10-CM | POA: Insufficient documentation

## 2016-04-09 DIAGNOSIS — Z89421 Acquired absence of other right toe(s): Secondary | ICD-10-CM | POA: Diagnosis not present

## 2016-04-09 LAB — BASIC METABOLIC PANEL
Anion gap: 9 (ref 5–15)
BUN: 36 mg/dL — AB (ref 6–20)
CHLORIDE: 93 mmol/L — AB (ref 101–111)
CO2: 30 mmol/L (ref 22–32)
Calcium: 9.5 mg/dL (ref 8.9–10.3)
Creatinine, Ser: 1.39 mg/dL — ABNORMAL HIGH (ref 0.61–1.24)
GFR calc Af Amer: >60 mL/min (ref >60)
GFR calc non Af Amer: 52 mL/min — ABNORMAL LOW (ref >60)
Glucose, Bld: 302 mg/dL — ABNORMAL HIGH (ref 65–99)
POTASSIUM: 5.3 mmol/L — AB (ref 3.5–5.1)
SODIUM: 132 mmol/L — AB (ref 135–145)

## 2016-04-09 MED ORDER — TORSEMIDE 20 MG PO TABS: 20.0000 mg | ORAL_TABLET | ORAL | 1 refills | Status: DC

## 2016-04-09 NOTE — Patient Instructions (Signed)
Decrease Torsemide to only Mon, Wed and Fri  Labs today  Your physician recommends that you schedule a follow-up appointment in: 2 weeks

## 2016-04-09 NOTE — Progress Notes (Signed)
PCP: Dr Pecolia Ades Primary HF Cardiologist: Dr Haroldine Laws Orthopedic: [name] Cardiac Surgeon: Dr Lawson Fiscal  HPI: [name] is a 65 year old with a history of CAD inferior MI, S/P CABG x1 with VSD repair on 03/13/16, S/P R 1st and 2nd toe amputation 03/24/2016, DMII, PAF, and hyperlipidemia.    Admitted September 2017 with chest pain. Inferior MI and required CABG x 1 with VSD repair on 03/13/2016. Post op course prolonged due to AF and cardiogenic shock. . Slow wean off milrinone due low mixed venous saturation. Also had amputation of R 1st and 2nd toe for osteo.  Discharge weight was 150 pounds. He was not discharged on bb or ace with hypotension.   Today he returns for hospital follow up. Has not weighed at home. He has had ongoing intermittent dizziness. Denies SOB/PND/Orthopnea. Ambulates with cane. Unable to weigh due to balance issues. (wears off loading shoe)Taking all medications. He has not smoked since discharge. Lives with his wife and daughter. [facility] following 3 days a week.    ECHO 03/19/2016 EF 40-45%. RV normal  Labs 04/02/2016: K 4.2 Creatinine 1.41    ROS: All systems negative except as listed in HPI, PMH and Problem List.  SH:  Social History   Social History  . Marital status: Married    Spouse name: William [name]  . Number of children: N/A  . Years of education: N/A   Occupational History  . Not on file.   Social History Main Topics  . Smoking status: Current Every Day Smoker    Packs/day: 0.50    Types: Cigarettes  . Smokeless tobacco: Never Used  . Alcohol use No  . Drug use: No  . Sexual activity: Not on file   Other Topics Concern  . Not on file   Social History Narrative  . No narrative on file    FH: No family history on file.  Past Medical History:  Diagnosis Date  . Diabetes mellitus without complication ([place])   . Hyperlipidemia   . Hypertension     Current Outpatient Prescriptions  Medication Sig Dispense Refill  . aspirin EC 81 MG EC tablet  Take 1 tablet (81 mg total) by mouth daily.    . digoxin (LANOXIN) 0.125 MG tablet Take 1 tablet (0.125 mg total) by mouth daily. 30 tablet 1  . insulin detemir (LEVEMIR) 100 UNIT/ML injection Inject 40-50 Units into the skin 2 (two) times daily. Take 50 every morning  Take 40 units every evening    . midodrine (PROAMATINE) 5 MG tablet Take 1 tablet (5 mg total) by mouth 3 (three) times daily with meals. 90 tablet 1  . oxyCODONE (OXY IR/ROXICODONE) 5 MG immediate release tablet Take 1 tablet (5 mg total) by mouth every 4 (four) hours as needed for severe pain. 28 tablet 0  . pantoprazole (PROTONIX) 40 MG tablet Take 40 mg by mouth daily.    . pramipexole (MIRAPEX) 0.125 MG tablet Take 0.125 mg by mouth at bedtime.    . rosuvastatin (CRESTOR) 40 MG tablet Take 1 tablet (40 mg total) by mouth daily. 90 tablet 0  . spironolactone (ALDACTONE) 25 MG tablet Take 1 tablet (25 mg total) by mouth daily. 30 tablet 1  . torsemide (DEMADEX) 20 MG tablet Take 1 tablet (20 mg total) by mouth daily. 30 tablet 1  . warfarin (COUMADIN) 5 MG tablet Take 1 tablet (5 mg total) by mouth one time only at 6 PM. Or as directed 30 tablet 1   No  current facility-administered medications for this encounter.     Vitals:   04/09/16 1038  BP: (!) 110/54  Weight: 146 lb 2 oz (66.3 kg)    PHYSICAL EXAM:  General:  Well appearing. No resp difficulty. In wheel chair. Daughter present.  HEENT: normal Neck: supple. JVP flat. Carotids 2+ bilaterally; no bruits. No lymphadenopathy or thryomegaly appreciated. Cor: PMI normal. Regular rate & rhythm. No rubs, gallops or murmurs. Sternal scar.  Lungs: clear. LLL crackles  Abdomen: soft, nontender, nondistended. No hepatosplenomegaly. No bruits or masses. Good bowel sounds. Extremities: no cyanosis, clubbing, rash, edema R foot off loading shoe. R foot dressing.  R third toe tip black Neuro: alert & orientedx3, cranial nerves grossly intact. Moves all 4 extremities w/o  difficulty. Affect pleasant.   ECG: Sinus Tach 110 bpm    ASSESSMENT & PLAN: 1. [place] STEMI CAD-->S/P CABG x1 VSD Repair On statin and aspirin  2. Chronic Systolic Heart Failure- Recent cardiogenic shock: EF 40-45% on 9/17 echo.  NYHA II-III. Volume status low. Cut back torsemide to 20 mg Mon-Wed-Fri.  Continue spiro 25 mg daily.  Continue digoxin 0.125 mg daily.  Would benefit from daily weights but with offloading shoe his balance is altered.  3. PAD Ischemic R Great Toe s/p amputation 9/22- Wound care per [name] 4. Hyperlipidemia- Continue statin.  5. DM Type II-  Per PCP 6. PAF- EKG today. Maintaining regular rhythm. On coumadin. [facility].    Check BMET today. Follow up in 2 weeks with Dr Haroldine Laws.

## 2016-04-09 NOTE — Progress Notes (Signed)
Advanced Heart Failure Medication Review by a Pharmacist  Does the patient  feel that his/her medications are working for him/her?  yes  Has the patient been experiencing any side effects to the medications prescribed?  no  Does the patient measure his/her own blood pressure or blood glucose at home?  yes   Does the patient have any problems obtaining medications due to transportation or finances?   no  Understanding of regimen: good Understanding of indications: good Potential of compliance: good Patient understands to avoid NSAIDs. Patient understands to avoid decongestants.  Issues to address at subsequent visits: None   Pharmacist comments:  William [name] is a pleasant 65 yo M presenting with his daughter and his medication bottles. Patient was recently discharged from hospital and all medications have been reviewed. He reports great compliance with his regimen but does state that he has woken up early in the morning with extreme dizziness (BG wnl) and thinks his BP may be low. He does take midodrine TID and I recommended taking the evening dose a little later to make sure he is covered overnight. He did not have any other medication-related questions or concerns for me at this time.   Ruta Hinds. Velva Harman, PharmD, BCPS, CPP Clinical Pharmacist Pager: [phone] Phone: [phone] 04/09/2016 10:57 AM      Time with patient: 10 minutes Preparation and documentation time: 2 minutes Total time: 12 minutes

## 2016-04-10 DIAGNOSIS — Z89411 Acquired absence of right great toe: Secondary | ICD-10-CM | POA: Diagnosis not present

## 2016-04-10 DIAGNOSIS — I2510 Atherosclerotic heart disease of native coronary artery without angina pectoris: Secondary | ICD-10-CM | POA: Diagnosis not present

## 2016-04-10 DIAGNOSIS — I10 Essential (primary) hypertension: Secondary | ICD-10-CM | POA: Diagnosis not present

## 2016-04-10 DIAGNOSIS — Z89421 Acquired absence of other right toe(s): Secondary | ICD-10-CM | POA: Diagnosis not present

## 2016-04-10 DIAGNOSIS — E119 Type 2 diabetes mellitus without complications: Secondary | ICD-10-CM | POA: Diagnosis not present

## 2016-04-10 DIAGNOSIS — Z4781 Encounter for orthopedic aftercare following surgical amputation: Secondary | ICD-10-CM | POA: Diagnosis not present

## 2016-04-11 ENCOUNTER — Inpatient Hospital Stay (INDEPENDENT_AMBULATORY_CARE_PROVIDER_SITE_OTHER): Payer: Medicare Other | Admitting: Orthopedic Surgery

## 2016-04-11 DIAGNOSIS — Z89432 Acquired absence of left foot: Secondary | ICD-10-CM

## 2016-04-12 ENCOUNTER — Other Ambulatory Visit: Payer: Self-pay | Admitting: Cardiothoracic Surgery

## 2016-04-12 ENCOUNTER — Ambulatory Visit (INDEPENDENT_AMBULATORY_CARE_PROVIDER_SITE_OTHER): Payer: Medicare Other | Admitting: Internal Medicine

## 2016-04-12 DIAGNOSIS — I2510 Atherosclerotic heart disease of native coronary artery without angina pectoris: Secondary | ICD-10-CM | POA: Diagnosis not present

## 2016-04-12 DIAGNOSIS — I10 Essential (primary) hypertension: Secondary | ICD-10-CM | POA: Diagnosis not present

## 2016-04-12 DIAGNOSIS — Z5181 Encounter for therapeutic drug level monitoring: Secondary | ICD-10-CM

## 2016-04-12 DIAGNOSIS — I480 Paroxysmal atrial fibrillation: Secondary | ICD-10-CM

## 2016-04-12 DIAGNOSIS — Z4781 Encounter for orthopedic aftercare following surgical amputation: Secondary | ICD-10-CM | POA: Diagnosis not present

## 2016-04-12 DIAGNOSIS — Z951 Presence of aortocoronary bypass graft: Secondary | ICD-10-CM

## 2016-04-12 DIAGNOSIS — E119 Type 2 diabetes mellitus without complications: Secondary | ICD-10-CM | POA: Diagnosis not present

## 2016-04-12 DIAGNOSIS — Z89411 Acquired absence of right great toe: Secondary | ICD-10-CM | POA: Diagnosis not present

## 2016-04-12 DIAGNOSIS — Z89421 Acquired absence of other right toe(s): Secondary | ICD-10-CM | POA: Diagnosis not present

## 2016-04-12 LAB — POCT INR: INR: 2.4

## 2016-04-16 ENCOUNTER — Ambulatory Visit (INDEPENDENT_AMBULATORY_CARE_PROVIDER_SITE_OTHER): Payer: Self-pay | Admitting: Physician Assistant

## 2016-04-16 ENCOUNTER — Ambulatory Visit
Admission: RE | Admit: 2016-04-16 | Discharge: 2016-04-16 | Disposition: A | Discharge status: Home / Self Care | Payer: Medicare Other | Source: Ambulatory Visit | Attending: Cardiothoracic Surgery | Admitting: Cardiothoracic Surgery

## 2016-04-16 ENCOUNTER — Ambulatory Visit (INDEPENDENT_AMBULATORY_CARE_PROVIDER_SITE_OTHER): Payer: Medicare Other

## 2016-04-16 VITALS — BP 102/66 | HR 100 | Resp 20 | Ht 65.0 in | Wt 146.0 lb

## 2016-04-16 DIAGNOSIS — Z89421 Acquired absence of other right toe(s): Secondary | ICD-10-CM | POA: Diagnosis not present

## 2016-04-16 DIAGNOSIS — I2510 Atherosclerotic heart disease of native coronary artery without angina pectoris: Secondary | ICD-10-CM | POA: Diagnosis not present

## 2016-04-16 DIAGNOSIS — Z5181 Encounter for therapeutic drug level monitoring: Secondary | ICD-10-CM

## 2016-04-16 DIAGNOSIS — Z89411 Acquired absence of right great toe: Secondary | ICD-10-CM | POA: Diagnosis not present

## 2016-04-16 DIAGNOSIS — Z4781 Encounter for orthopedic aftercare following surgical amputation: Secondary | ICD-10-CM | POA: Diagnosis not present

## 2016-04-16 DIAGNOSIS — I10 Essential (primary) hypertension: Secondary | ICD-10-CM | POA: Diagnosis not present

## 2016-04-16 DIAGNOSIS — Z951 Presence of aortocoronary bypass graft: Secondary | ICD-10-CM

## 2016-04-16 DIAGNOSIS — I480 Paroxysmal atrial fibrillation: Secondary | ICD-10-CM

## 2016-04-16 DIAGNOSIS — E119 Type 2 diabetes mellitus without complications: Secondary | ICD-10-CM | POA: Diagnosis not present

## 2016-04-16 DIAGNOSIS — J9811 Atelectasis: Secondary | ICD-10-CM | POA: Diagnosis not present

## 2016-04-16 LAB — POCT INR: INR: 2

## 2016-04-16 NOTE — Progress Notes (Signed)
HPI:  Patient returns for routine postoperative follow-up having undergone Emergent CABG x 1 and closure of VSD on 03/13/2016. The patient's early postoperative recovery while in the hospital was notable for prolonged inotropic support due to heart failure, PAF, and amputation of his right 1st and 2nd toe.  Since hospital discharge the patient reports he is doing great.  He initially had some issue with hypotension.  After follow up with AHF team it was felt to be due to diuretic therapy which was decreased and they also changed the timing of his BP medications.  He also has an infection in his recent toe amputation.  He states that may have to amputate his foot, but currently they are treating him with ABX and NTG patches to his feet.  He attempts to ambulate some, but this is difficult due to his off loading shoe.  Current Outpatient Prescriptions  Medication Sig Dispense Refill  . aspirin EC 81 MG EC tablet Take 1 tablet (81 mg total) by mouth daily.    . digoxin (LANOXIN) 0.125 MG tablet Take 1 tablet (0.125 mg total) by mouth daily. 30 tablet 1  . doxycycline (ADOXA) 100 MG tablet Take 100 mg by mouth 2 (two) times daily.    . insulin detemir (LEVEMIR) 100 UNIT/ML injection Inject 40-50 Units into the skin 2 (two) times daily. Take 50 every morning  Take 40 units every evening    . midodrine (PROAMATINE) 5 MG tablet Take 1 tablet (5 mg total) by mouth 3 (three) times daily with meals. 90 tablet 1  . oxyCODONE (OXY IR/ROXICODONE) 5 MG immediate release tablet Take 1 tablet (5 mg total) by mouth every 4 (four) hours as needed for severe pain. 28 tablet 0  . pantoprazole (PROTONIX) 40 MG tablet Take 40 mg by mouth daily.    . pramipexole (MIRAPEX) 0.125 MG tablet Take 0.125 mg by mouth at bedtime.    . rosuvastatin (CRESTOR) 40 MG tablet Take 1 tablet (40 mg total) by mouth daily. 90 tablet 0  . spironolactone (ALDACTONE) 25 MG tablet Take 1 tablet (25 mg total) by mouth daily. 30 tablet 1  .  torsemide (DEMADEX) 20 MG tablet Take 1 tablet (20 mg total) by mouth 3 (three) times a week. Every Mon, Wed and Fri 30 tablet 1  . warfarin (COUMADIN) 5 MG tablet Take 1 tablet (5 mg total) by mouth one time only at 6 PM. Or as directed 30 tablet 1   No current facility-administered medications for this visit.     Physical Exam:  BP 102/66 (BP Location: Left Arm, Patient Position: Sitting, Cuff Size: Normal)   Pulse 100   Resp 20   Ht 5\' 5"  (1.651 m)   Wt 146 lb (66.2 kg)   SpO2 98% Comment: RA  BMI 24.30 kg/m   Gen: no apparent distress Heart: RRR Lungs: CTA bilaterally Abd: soft non-tender, non-distended Incisions: well healed Extremity: no edema present, dressing present on right foot, some gangrenous appearance of toes noted  Diagnostic Tests:  CXR: post surgical changes, no pneumothorax, no significant pleural effusion  A/P;  1. S/P Emergent CABG x 1, VSD repair- patient stable, continue current medications per AHF recommendation 2. Right Foot infection- toes previously amputated during hospitalization, currently on ABX, NTG patches... Care per Dr. Sharol Given 3. CV- PAF, maintaining NSR, INR is therapeutic 4. Dispo- RTC in 6 weeks for follow up with Dr. Prescott Gum, continue care per AHF   Ellwood Handler, PA-C [facility] [phone]

## 2016-04-17 ENCOUNTER — Encounter: Payer: Self-pay | Admitting: Cardiovascular Disease

## 2016-04-17 ENCOUNTER — Ambulatory Visit (INDEPENDENT_AMBULATORY_CARE_PROVIDER_SITE_OTHER): Payer: Medicare Other | Admitting: Cardiovascular Disease

## 2016-04-17 ENCOUNTER — Encounter (HOSPITAL_COMMUNITY): Payer: Self-pay | Admitting: Cardiology

## 2016-04-17 VITALS — BP 113/73 | HR 100 | Ht 65.0 in | Wt 146.0 lb

## 2016-04-17 DIAGNOSIS — I5022 Chronic systolic (congestive) heart failure: Secondary | ICD-10-CM | POA: Diagnosis not present

## 2016-04-17 DIAGNOSIS — I739 Peripheral vascular disease, unspecified: Secondary | ICD-10-CM

## 2016-04-17 DIAGNOSIS — I2510 Atherosclerotic heart disease of native coronary artery without angina pectoris: Secondary | ICD-10-CM | POA: Diagnosis not present

## 2016-04-17 DIAGNOSIS — E785 Hyperlipidemia, unspecified: Secondary | ICD-10-CM | POA: Diagnosis not present

## 2016-04-17 DIAGNOSIS — I2119 ST elevation (STEMI) myocardial infarction involving other coronary artery of inferior wall: Secondary | ICD-10-CM | POA: Diagnosis not present

## 2016-04-17 NOTE — Progress Notes (Signed)
Cardiology Office Note   Date:  04/17/2016   ID:  William [name], DOB 1950/08/24, MRN [id number]  PCP:  Philis Fendt, MD  Cardiologist:   [name], MD   Chief Complaint  Patient presents with  . Follow-up      History of Present Illness: William [name] is a 65 y.o. male who Is here today for a follow-up visit regarding peripheral arterial disease. He presented to see me in September for nonhealing ulcer involving the right great toe. However, during that evaluation, he was noted to have inferior ST elevation on his EKG with severe respiratory distress and a loud systolic murmur. A mechanical complication was suspected and the patient was transferred for emergent echo and cardiac catheterization which confirmed postinfarct VSD with occluded right coronary artery and mild to moderate disease involving the LAD and left circumflex. He underwent emergent one-vessel CABG and VSD repair. He had a prolonged postoperative course due to cardiogenic shock and hypotension. He had postoperative atrial fibrillation and was started on anticoagulation. He ultimately underwent amputation of the right great toe by Dr. Sharol Given. The patient has known history of diabetes. There has been some discoloration involving the tip of the second toe as well as the amputation site.  Noninvasive vascular studies in September showed mildly reduced ABI with evidence of right SFA disease and two-vessel runoff below the knee. The patient feels significantly better with resolution of dyspnea. He denies any chest pain.    Past Medical History:  Diagnosis Date  . Diabetes mellitus without complication ([place])   . Hyperlipidemia   . Hypertension     Past Surgical History:  Procedure Laterality Date  . AMPUTATION Right 03/23/2016   Procedure: 1st and 2nd Ray Amputation Right Foot;  Surgeon: Newt Minion, MD;  Location: [facility];  Service: Orthopedics;  Laterality: Right;  . CARDIAC CATHETERIZATION N/A 03/13/2016   Procedure: Right/Left Heart Cath and Coronary Angiography;  Surgeon: Sherren Mocha, MD;  Location: [place] [facility];  Service: Cardiovascular;  Laterality: N/A;  . CARDIAC CATHETERIZATION N/A 03/13/2016   Procedure: IABP Insertion;  Surgeon: Sherren Mocha, MD;  Location: [place] [facility];  Service: Cardiovascular;  Laterality: N/A;  . CORONARY ARTERY BYPASS GRAFT N/A 03/13/2016   Procedure: CORONARY ARTERY BYPASS GRAFTING (CABG) x 1 (SVG to OM) with EVH from [place];  Surgeon: Ivin Poot, MD;  Location: [facility];  Service: Open Heart Surgery;  Laterality: N/A;  . TEE WITHOUT CARDIOVERSION N/A 03/13/2016   Procedure: TRANSESOPHAGEAL ECHOCARDIOGRAM (TEE);  Surgeon: Ivin Poot, MD;  Location: [facility];  Service: Open Heart Surgery;  Laterality: N/A;  . VSD REPAIR N/A 03/13/2016   Procedure: VENTRICULAR SEPTAL DEFECT (VSD) REPAIR;  Surgeon: Ivin Poot, MD;  Location: [place] [facility];  Service: Open Heart Surgery;  Laterality: N/A;     Current Outpatient Prescriptions  Medication Sig Dispense Refill  . aspirin EC 81 MG EC tablet Take 1 tablet (81 mg total) by mouth daily.    . digoxin (LANOXIN) 0.125 MG tablet Take 1 tablet (0.125 mg total) by mouth daily. 30 tablet 1  . doxycycline (ADOXA) 100 MG tablet Take 100 mg by mouth 2 (two) times daily.    . insulin detemir (LEVEMIR) 100 UNIT/ML injection Inject 40-50 Units into the skin 2 (two) times daily. Take 50 every morning  Take 40 units every evening    . midodrine (PROAMATINE) 5 MG tablet Take 1 tablet (5 mg total) by mouth 3 (  three) times daily with meals. 90 tablet 1  . oxyCODONE (OXY IR/ROXICODONE) 5 MG immediate release tablet Take 1 tablet (5 mg total) by mouth every 4 (four) hours as needed for severe pain. 28 tablet 0  . pantoprazole (PROTONIX) 40 MG tablet Take 40 mg by mouth daily.    . pramipexole (MIRAPEX) 0.125 MG tablet Take 0.125 mg by mouth at bedtime.    . rosuvastatin (CRESTOR) 40 MG tablet Take 1 tablet (40  mg total) by mouth daily. 90 tablet 0  . spironolactone (ALDACTONE) 25 MG tablet Take 1 tablet (25 mg total) by mouth daily. 30 tablet 1  . torsemide (DEMADEX) 20 MG tablet Take 1 tablet (20 mg total) by mouth 3 (three) times a week. Every Mon, Wed and Fri 30 tablet 1  . warfarin (COUMADIN) 5 MG tablet Take 1 tablet (5 mg total) by mouth one time only at 6 PM. Or as directed 30 tablet 1  . nitroGLYCERIN (NITRODUR - DOSED IN MG/24 HR) 0.4 mg/hr patch Place 0.4 mg onto the skin 3 times/day as needed-between meals & bedtime.     No current facility-administered medications for this visit.     Allergies:   Morphine and related    Social History:  The patient  reports that he has quit smoking. His smoking use included Cigarettes. He smoked 0.50 packs per day. He has never used smokeless tobacco. He reports that he does not drink alcohol or use drugs.   Family History:  Not able to obtain due to distress.  ROS:  Please see the history of present illness.   Otherwise, review of systems are positive for none.   All other systems are reviewed and negative.    PHYSICAL EXAM: VS:  BP 113/73 (BP Location: Right Arm, Patient Position: Sitting, Cuff Size: Normal)   Pulse 100   Ht 5\' 5"  (1.651 m)   Wt 146 lb (66.2 kg)   BMI 24.30 kg/m  , BMI Body mass index is 24.3 kg/m. GEN: Well nourished, well developed, in no acute distress  HEENT: normal  Neck: no JVD, carotid bruits, or masses Cardiac: RRR; no  rubs, or gallops,no edema . 3/6 holosystolic murmur at the apex radiating to the  left sternal border. Respiratory:  clear to auscultation bilaterally, normal work of breathing GI: soft, nontender, nondistended, + BS MS: no deformity or atrophy  Skin: warm and dry, no rash Neuro:  Strength and sensation are intact Psych: euthymic mood, full affect The patient has black discoloration at the tip of the second right toe and some discoloration at the amputation site.  EKG:  EKG is not ordered  today.    Recent Labs: 03/22/2016: ALT 19 03/30/2016: Magnesium 2.2 04/01/2016: Hemoglobin 8.9; Platelets 327 04/09/2016: BUN 36; Creatinine, Ser 1.39; Potassium 5.3; Sodium 132    Lipid Panel    Component Value Date/Time   CHOL 96 03/13/2016 1057   TRIG 146 03/13/2016 1057   HDL 23 (L) 03/13/2016 1057   CHOLHDL 4.2 03/13/2016 1057   VLDL 29 03/13/2016 1057   LDLCALC 44 03/13/2016 1057      Wt Readings from Last 3 Encounters:  04/17/16 146 lb (66.2 kg)  04/16/16 146 lb (66.2 kg)  04/09/16 146 lb 2 oz (66.3 kg)      No flowsheet data found.    ASSESSMENT AND PLAN:  1.  Peripheral arterial disease with critical limb ischemia involving the right foot ([place]uther[place] class 5): the patient might require more amputation but I  think it's important to ensure optimal blood flow. Thus, I have recommended proceeding with abdominal aortogram with lower extremity runoff and possible endovascular intervention. I discussed the risks and benefits.  Hold warfarin 5 days before the procedure. Continue wound care.  2. Chronic systolic heart failure: He appears to be euvolemic. He continues to have intermittent episodes of hypotension but he might be close to being able to tolerate a small dose beta blocker in the near future. In the meantime, continue treatment with digoxin and current dose of torsemide.  3.Postoperative atrial fibrillation: Currently in sinus rhythm.  4. Coronary artery disease involving native coronary arteries without angina: Continue medical therapy.   Disposition:   follow-up with me in one month  Signed,  [name], MD  04/17/2016 5:57 PM    [place]

## 2016-04-17 NOTE — Patient Instructions (Signed)
Medication Instructions:  Your physician recommends that you continue on your current medications as directed. Please refer to the Current Medication list given to you today.  Labwork: No new orders.   Testing/Procedures: Your physician has requested that you have a peripheral vascular angiogram. This exam is performed at the hospital. During this exam IV contrast is used to look at arterial blood flow. Please review the information sheet given for details.  Follow-Up: Your physician recommends that you schedule a follow-up appointment in: [place] with Dr Fletcher Anon   Any Other Special Instructions Will Be Listed Below (If Applicable).     If you need a refill on your cardiac medications before your next appointment, please call your pharmacy.

## 2016-04-18 ENCOUNTER — Ambulatory Visit: Payer: Medicare Other | Admitting: Cardiothoracic Surgery

## 2016-04-19 ENCOUNTER — Ambulatory Visit (INDEPENDENT_AMBULATORY_CARE_PROVIDER_SITE_OTHER): Payer: Medicare Other | Admitting: Orthopedic Surgery

## 2016-04-19 DIAGNOSIS — L03031 Cellulitis of right toe: Secondary | ICD-10-CM

## 2016-04-19 DIAGNOSIS — I70261 Atherosclerosis of native arteries of extremities with gangrene, right leg: Secondary | ICD-10-CM

## 2016-04-23 ENCOUNTER — Ambulatory Visit (INDEPENDENT_AMBULATORY_CARE_PROVIDER_SITE_OTHER): Payer: Medicare Other | Admitting: Pharmacist Clinician (PhC)/ Clinical Pharmacy Specialist

## 2016-04-23 DIAGNOSIS — I10 Essential (primary) hypertension: Secondary | ICD-10-CM | POA: Diagnosis not present

## 2016-04-23 DIAGNOSIS — E119 Type 2 diabetes mellitus without complications: Secondary | ICD-10-CM | POA: Diagnosis not present

## 2016-04-23 DIAGNOSIS — Z4781 Encounter for orthopedic aftercare following surgical amputation: Secondary | ICD-10-CM | POA: Diagnosis not present

## 2016-04-23 DIAGNOSIS — I2510 Atherosclerotic heart disease of native coronary artery without angina pectoris: Secondary | ICD-10-CM | POA: Diagnosis not present

## 2016-04-23 DIAGNOSIS — I480 Paroxysmal atrial fibrillation: Secondary | ICD-10-CM

## 2016-04-23 DIAGNOSIS — Z89421 Acquired absence of other right toe(s): Secondary | ICD-10-CM | POA: Diagnosis not present

## 2016-04-23 DIAGNOSIS — Z5181 Encounter for therapeutic drug level monitoring: Secondary | ICD-10-CM

## 2016-04-23 DIAGNOSIS — Z89411 Acquired absence of right great toe: Secondary | ICD-10-CM | POA: Diagnosis not present

## 2016-04-23 LAB — POCT INR: INR: 2.3

## 2016-04-27 ENCOUNTER — Encounter (HOSPITAL_COMMUNITY): Payer: Self-pay | Admitting: Internal Medicine

## 2016-04-27 ENCOUNTER — Ambulatory Visit (HOSPITAL_COMMUNITY)
Admission: RE | Admit: 2016-04-27 | Discharge: 2016-04-27 | Disposition: A | Discharge status: Home / Self Care | Payer: Medicare Other | Source: Ambulatory Visit | Attending: Internal Medicine | Admitting: Internal Medicine

## 2016-04-27 VITALS — BP 108/58 | HR 107 | Wt 144.1 lb

## 2016-04-27 DIAGNOSIS — I480 Paroxysmal atrial fibrillation: Secondary | ICD-10-CM | POA: Diagnosis not present

## 2016-04-27 DIAGNOSIS — I951 Orthostatic hypotension: Secondary | ICD-10-CM | POA: Insufficient documentation

## 2016-04-27 DIAGNOSIS — Z7982 Long term (current) use of aspirin: Secondary | ICD-10-CM | POA: Insufficient documentation

## 2016-04-27 DIAGNOSIS — I110 Hypertensive heart disease with heart failure: Secondary | ICD-10-CM | POA: Insufficient documentation

## 2016-04-27 DIAGNOSIS — Z794 Long term (current) use of insulin: Secondary | ICD-10-CM | POA: Diagnosis not present

## 2016-04-27 DIAGNOSIS — Z87891 Personal history of nicotine dependence: Secondary | ICD-10-CM | POA: Insufficient documentation

## 2016-04-27 DIAGNOSIS — Z79899 Other long term (current) drug therapy: Secondary | ICD-10-CM | POA: Diagnosis not present

## 2016-04-27 DIAGNOSIS — I252 Old myocardial infarction: Secondary | ICD-10-CM | POA: Diagnosis not present

## 2016-04-27 DIAGNOSIS — I739 Peripheral vascular disease, unspecified: Secondary | ICD-10-CM | POA: Insufficient documentation

## 2016-04-27 DIAGNOSIS — E785 Hyperlipidemia, unspecified: Secondary | ICD-10-CM | POA: Diagnosis not present

## 2016-04-27 DIAGNOSIS — E119 Type 2 diabetes mellitus without complications: Secondary | ICD-10-CM | POA: Insufficient documentation

## 2016-04-27 DIAGNOSIS — Z89411 Acquired absence of right great toe: Secondary | ICD-10-CM | POA: Diagnosis not present

## 2016-04-27 DIAGNOSIS — I2510 Atherosclerotic heart disease of native coronary artery without angina pectoris: Secondary | ICD-10-CM | POA: Diagnosis not present

## 2016-04-27 DIAGNOSIS — I5022 Chronic systolic (congestive) heart failure: Secondary | ICD-10-CM | POA: Insufficient documentation

## 2016-04-27 DIAGNOSIS — Z7901 Long term (current) use of anticoagulants: Secondary | ICD-10-CM | POA: Insufficient documentation

## 2016-04-27 DIAGNOSIS — Z951 Presence of aortocoronary bypass graft: Secondary | ICD-10-CM | POA: Diagnosis not present

## 2016-04-27 LAB — BASIC METABOLIC PANEL
Anion gap: 8 (ref 5–15)
BUN: 19 mg/dL (ref 6–20)
CHLORIDE: 101 mmol/L (ref 101–111)
CO2: 28 mmol/L (ref 22–32)
Calcium: 9.5 mg/dL (ref 8.9–10.3)
Creatinine, Ser: 1.18 mg/dL (ref 0.61–1.24)
GFR calc Af Amer: >60 mL/min (ref >60)
GFR calc non Af Amer: >60 mL/min (ref >60)
Glucose, Bld: 230 mg/dL — ABNORMAL HIGH (ref 65–99)
POTASSIUM: 4.3 mmol/L (ref 3.5–5.1)
SODIUM: 137 mmol/L (ref 135–145)

## 2016-04-27 NOTE — Addendum Note (Signed)
Encounter addended by: Scarlette Calico, RN on: 04/27/2016 11:19 AM<BR>    Actions taken: Order Entry activity accessed, Diagnosis association updated, Sign clinical note

## 2016-04-27 NOTE — Progress Notes (Signed)
PCP: Dr Pecolia Ades Primary HF Cardiologist: Dr Haroldine Laws Orthopedic: [name] Cardiac Surgeon: Dr Lawson Fiscal  HPI: Mr Sosnoski is a 65 year old with a history of CAD with inferior MI complicated by acute VSD and cardiogenic shock, S/P CABG x1 with VSD repair on 03/13/16, S/P R 1st and 2nd toe amputation 03/24/2016, DMII, PAF, and hyperlipidemia.    Admitted September 2017 with chest pain. Inferior MI c/b acute VSD and cardiogenic shock required CABG x 1 with VSD repair on 03/13/2016. Post op course prolonged due to AF and cardiogenic shock due to RV failure  Slow wean off milrinone due low mixed venous saturation. Also had amputation of R 1st and 2nd toe for osteo.  Discharge weight was 150 pounds. He was not discharged on bb or ace with hypotension.   Say Amy Clegg in clinic 2 weeks ago. Torsemide cut back. Was on midodrine 3x/day but was waking up dizzy so now takes night dose before bed instead of at dinner. No further dizziness. Denies CP/ SOB/PND/Orthopnea. Cough resolved. Unable to walk on foot.  He has not smoked since discharge. Lives with his wife and daughter. On November 1 will have LE angiogram attempting limb salvage,    ECHO 03/19/2016 EF 40-45%. RV normal  Labs 04/02/2016: K 4.2 Creatinine 1.41    ROS: All systems negative except as listed in HPI, PMH and Problem List.  SH:  Social History   Social History  . Marital status: Married    Spouse name: Lavell Luster  . Number of children: N/A  . Years of education: N/A   Occupational History  . Not on file.   Social History Main Topics  . Smoking status: Former Smoker    Packs/day: 0.50    Types: Cigarettes  . Smokeless tobacco: Never Used  . Alcohol use No  . Drug use: No  . Sexual activity: Not on file   Other Topics Concern  . Not on file   Social History Narrative  . No narrative on file    FH: No family history on file.  Past Medical History:  Diagnosis Date  . Diabetes mellitus without complication ([place])   .  Hyperlipidemia   . Hypertension     Current Outpatient Prescriptions  Medication Sig Dispense Refill  . aspirin EC 81 MG EC tablet Take 1 tablet (81 mg total) by mouth daily.    . digoxin (LANOXIN) 0.125 MG tablet Take 1 tablet (0.125 mg total) by mouth daily. 30 tablet 1  . insulin detemir (LEVEMIR) 100 UNIT/ML injection Inject 40-50 Units into the skin 2 (two) times daily. Take 50 every morning  Take 40 units every evening    . midodrine (PROAMATINE) 5 MG tablet Take 1 tablet (5 mg total) by mouth 3 (three) times daily with meals. 90 tablet 1  . nitroGLYCERIN (NITRODUR - DOSED IN MG/24 HR) 0.4 mg/hr patch Place 0.4 mg onto the skin 3 times/day as needed-between meals & bedtime.    [name] oxyCODONE-acetaminophen (PERCOCET/ROXICET) 5-325 MG tablet Take 1 tablet by mouth every 6 (six) hours as needed for severe pain.    . pantoprazole (PROTONIX) 40 MG tablet Take 40 mg by mouth daily.    . pramipexole (MIRAPEX) 0.125 MG tablet Take 0.125 mg by mouth at bedtime.    . rosuvastatin (CRESTOR) 40 MG tablet Take 1 tablet (40 mg total) by mouth daily. 90 tablet 0  . spironolactone (ALDACTONE) 25 MG tablet Take 1 tablet (25 mg total) by mouth daily. 30 tablet 1  .  torsemide (DEMADEX) 20 MG tablet Take 1 tablet (20 mg total) by mouth 3 (three) times a week. Every Mon, Wed and Fri 30 tablet 1  . warfarin (COUMADIN) 5 MG tablet Take 1 tablet (5 mg total) by mouth one time only at 6 PM. Or as directed (Patient not taking: Reported on 04/27/2016) 30 tablet 1   No current facility-administered medications for this encounter.     Vitals:   04/27/16 1031  BP: (!) 108/58  Pulse: (!) 107  SpO2: 98%  Weight: 144 lb 1.9 oz (65.4 kg)    PHYSICAL EXAM:  General: No resp difficulty. In wheel chair. Daughter present.  HEENT: normal Neck: supple. JVP flat. Carotids 2+ bilaterally; no bruits. No lymphadenopathy or thryomegaly appreciated. Cor: PMI normal. Tachy and regular . No rubs, gallops or murmurs. Sternal  scar.  Lungs: clear. LLL dull  Abdomen: soft, nontender, nondistended. No hepatosplenomegaly. No bruits or masses. Good bowel sounds. Extremities: no cyanosis, clubbing, rash, edema R foot off loading shoe. R foot dressing.  R third toe tip black Neuro: alert & orientedx3, cranial nerves grossly intact. Moves all 4 extremities w/o difficulty. Affect pleasant.   ASSESSMENT & PLAN: 1. Inerior STEMI CAD-->S/P CABG x1 VSD Repair with RV failure On statin and aspirin. Unable to go to CR with PAD at thsi time.  2. Chronic Systolic Heart Failure- Recent cardiogenic shock with RV failure: EF 40-45% on 9/17 echo.  NYHA II-III. Volume status ok now. Remains tachy but feels ok.  Continue spiro 25 mg daily.  Continue digoxin 0.125 mg daily.  Unable to add ACE or b-block with hypotension. May try low dose carvedilol at next visit 3. PAD Ischemic R Great Toe s/p amputation 9/22- Wound care per [name]. Pending LE angio with Dr Fletcher Anon 4. Hyperlipidemia- Continue statin.  5. DM Type II-  Per PCP 6. PAF- EKG today. Maintaining regular rhythm. On coumadin. INR followed by [facility].  7. RV failure 8. Orthostatic hypotension --Continue midodrine for now  Check BMET today. Follow up in 4 weeks  Bensimhon, Daniel,MD 11:08 AM

## 2016-04-27 NOTE — Patient Instructions (Signed)
Lab today  Your physician recommends that you schedule a follow-up appointment in: 4 weeks  

## 2016-04-30 ENCOUNTER — Telehealth (INDEPENDENT_AMBULATORY_CARE_PROVIDER_SITE_OTHER): Payer: Self-pay | Admitting: *Deleted

## 2016-04-30 DIAGNOSIS — Z89421 Acquired absence of other right toe(s): Secondary | ICD-10-CM | POA: Diagnosis not present

## 2016-04-30 DIAGNOSIS — Z89411 Acquired absence of right great toe: Secondary | ICD-10-CM | POA: Diagnosis not present

## 2016-04-30 DIAGNOSIS — I2510 Atherosclerotic heart disease of native coronary artery without angina pectoris: Secondary | ICD-10-CM | POA: Diagnosis not present

## 2016-04-30 DIAGNOSIS — E119 Type 2 diabetes mellitus without complications: Secondary | ICD-10-CM | POA: Diagnosis not present

## 2016-04-30 DIAGNOSIS — I10 Essential (primary) hypertension: Secondary | ICD-10-CM | POA: Diagnosis not present

## 2016-04-30 DIAGNOSIS — Z4781 Encounter for orthopedic aftercare following surgical amputation: Secondary | ICD-10-CM | POA: Diagnosis not present

## 2016-04-30 NOTE — Telephone Encounter (Signed)
This pt is s/p a right  1st and 2nd ray amp on 03/23/16. HHN states that the incision has opened slightly. She states that they are using a dry dressing only that the pt is using nitropatch but does not know how compliant he is being with non weight bearing status. He is sch for angioplasty on 05/02/16. Please advise if any changes needed. HHN wanted to do wet to dry.

## 2016-04-30 NOTE — Telephone Encounter (Signed)
William [name] from [place] called to report wound changes. Stated it was a good 1cm depth. Wanted to see if there was anything she should do different for his would care. Call back number is [phone]

## 2016-04-30 NOTE — Telephone Encounter (Signed)
Just have home health nursing reinforce nonweight bearing daily soap cleansing dry dressing change daily no need for wet to dry at this time.

## 2016-05-01 NOTE — Telephone Encounter (Signed)
I called and sw HHN to advise of message below. Will continue orders as instructed and advised that pt has follow up on 05/10/16 with Dr. Sharol Given

## 2016-05-02 ENCOUNTER — Other Ambulatory Visit: Payer: Self-pay | Admitting: Cardiovascular Disease

## 2016-05-02 ENCOUNTER — Encounter (HOSPITAL_COMMUNITY)
Admission: RE | Disposition: A | Discharge status: Home / Self Care | Payer: Self-pay | Source: Ambulatory Visit | Attending: Cardiovascular Disease

## 2016-05-02 ENCOUNTER — Ambulatory Visit (HOSPITAL_COMMUNITY)
Admission: RE | Admit: 2016-05-02 | Discharge: 2016-05-02 | Disposition: A | Discharge status: Home / Self Care | Payer: Medicare Other | Source: Ambulatory Visit | Attending: Cardiovascular Disease | Admitting: Cardiovascular Disease

## 2016-05-02 DIAGNOSIS — Z7901 Long term (current) use of anticoagulants: Secondary | ICD-10-CM | POA: Diagnosis not present

## 2016-05-02 DIAGNOSIS — I9789 Other postprocedural complications and disorders of the circulatory system, not elsewhere classified: Secondary | ICD-10-CM | POA: Insufficient documentation

## 2016-05-02 DIAGNOSIS — I2510 Atherosclerotic heart disease of native coronary artery without angina pectoris: Secondary | ICD-10-CM | POA: Insufficient documentation

## 2016-05-02 DIAGNOSIS — Z9889 Other specified postprocedural states: Secondary | ICD-10-CM | POA: Insufficient documentation

## 2016-05-02 DIAGNOSIS — E11621 Type 2 diabetes mellitus with foot ulcer: Secondary | ICD-10-CM | POA: Insufficient documentation

## 2016-05-02 DIAGNOSIS — I70201 Unspecified atherosclerosis of native arteries of extremities, right leg: Secondary | ICD-10-CM | POA: Insufficient documentation

## 2016-05-02 DIAGNOSIS — E785 Hyperlipidemia, unspecified: Secondary | ICD-10-CM | POA: Diagnosis not present

## 2016-05-02 DIAGNOSIS — Z87891 Personal history of nicotine dependence: Secondary | ICD-10-CM | POA: Diagnosis not present

## 2016-05-02 DIAGNOSIS — E1151 Type 2 diabetes mellitus with diabetic peripheral angiopathy without gangrene: Secondary | ICD-10-CM | POA: Insufficient documentation

## 2016-05-02 DIAGNOSIS — I4891 Unspecified atrial fibrillation: Secondary | ICD-10-CM | POA: Insufficient documentation

## 2016-05-02 DIAGNOSIS — I998 Other disorder of circulatory system: Secondary | ICD-10-CM | POA: Diagnosis not present

## 2016-05-02 DIAGNOSIS — I739 Peripheral vascular disease, unspecified: Secondary | ICD-10-CM

## 2016-05-02 DIAGNOSIS — I110 Hypertensive heart disease with heart failure: Secondary | ICD-10-CM | POA: Diagnosis not present

## 2016-05-02 DIAGNOSIS — I5022 Chronic systolic (congestive) heart failure: Secondary | ICD-10-CM | POA: Diagnosis not present

## 2016-05-02 DIAGNOSIS — Z794 Long term (current) use of insulin: Secondary | ICD-10-CM | POA: Insufficient documentation

## 2016-05-02 DIAGNOSIS — I70211 Atherosclerosis of native arteries of extremities with intermittent claudication, right leg: Secondary | ICD-10-CM | POA: Diagnosis not present

## 2016-05-02 DIAGNOSIS — Z951 Presence of aortocoronary bypass graft: Secondary | ICD-10-CM | POA: Insufficient documentation

## 2016-05-02 DIAGNOSIS — Z7982 Long term (current) use of aspirin: Secondary | ICD-10-CM | POA: Insufficient documentation

## 2016-05-02 DIAGNOSIS — I771 Stricture of artery: Secondary | ICD-10-CM | POA: Insufficient documentation

## 2016-05-02 DIAGNOSIS — L97518 Non-pressure chronic ulcer of other part of right foot with other specified severity: Secondary | ICD-10-CM | POA: Insufficient documentation

## 2016-05-02 HISTORY — PX: LOWER EXTREMITY ANGIOGRAM: SHX5508

## 2016-05-02 HISTORY — PX: PERIPHERAL VASCULAR CATHETERIZATION: SHX172C

## 2016-05-02 LAB — POCT ACTIVATED CLOTTING TIME
ACTIVATED CLOTTING TIME: 213 s
ACTIVATED CLOTTING TIME: 175 s
Activated Clotting Time: 208 seconds
Activated Clotting Time: 186 seconds

## 2016-05-02 LAB — CBC
HEMATOCRIT: 32.8 % — AB (ref 39.0–52.0)
Hemoglobin: 10.4 g/dL — ABNORMAL LOW (ref 13.0–17.0)
MCH: 25.9 pg — ABNORMAL LOW (ref 26.0–34.0)
MCHC: 31.7 g/dL (ref 30.0–36.0)
MCV: 81.8 fL (ref 78.0–100.0)
PLATELETS: 235 10*3/uL (ref 150–400)
RBC: 4.01 MIL/uL — ABNORMAL LOW (ref 4.22–5.81)
RDW: 14 % (ref 11.5–15.5)
WBC: 8 10*3/uL (ref 4.0–10.5)

## 2016-05-02 LAB — GLUCOSE, CAPILLARY
GLUCOSE-CAPILLARY: 137 mg/dL — AB (ref 65–99)
Glucose-Capillary: 152 mg/dL — ABNORMAL HIGH (ref 65–99)

## 2016-05-02 LAB — PROTIME-INR
INR: 1.11
PROTHROMBIN TIME: 14.4 s (ref 11.4–15.2)

## 2016-05-02 SURGERY — ANGIOGRAM, LOWER EXTREMITY
Laterality: Right

## 2016-05-02 MED ORDER — HEPARIN (PORCINE) IN NACL 2-0.9 UNIT/ML-% IJ SOLN
INTRAMUSCULAR | Status: AC
  Filled 2016-05-02: qty 1000

## 2016-05-02 MED ORDER — SODIUM CHLORIDE 0.9% FLUSH: 3.0000 mL | INTRAVENOUS | Status: DC | PRN

## 2016-05-02 MED ORDER — HEPARIN SODIUM (PORCINE) 1000 UNIT/ML IJ SOLN
INTRAMUSCULAR | Status: AC
  Filled 2016-05-02: qty 1

## 2016-05-02 MED ORDER — HEPARIN (PORCINE) IN NACL 2-0.9 UNIT/ML-% IJ SOLN
INTRAMUSCULAR | Status: DC | PRN
  Administered 2016-05-02: 11:00:00

## 2016-05-02 MED ORDER — FENTANYL CITRATE (PF) 100 MCG/2ML IJ SOLN
INTRAMUSCULAR | Status: AC
  Filled 2016-05-02: qty 2

## 2016-05-02 MED ORDER — LIDOCAINE HCL (PF) 1 % IJ SOLN
INTRAMUSCULAR | Status: DC | PRN
  Administered 2016-05-02: 20 mL via SUBCUTANEOUS

## 2016-05-02 MED ORDER — FENTANYL CITRATE (PF) 100 MCG/2ML IJ SOLN
INTRAMUSCULAR | Status: DC | PRN
  Administered 2016-05-02: 25 ug via INTRAVENOUS

## 2016-05-02 MED ORDER — ASPIRIN 81 MG PO CHEW: 81.0000 mg | CHEWABLE_TABLET | ORAL | Status: DC

## 2016-05-02 MED ORDER — MIDAZOLAM HCL 2 MG/2ML IJ SOLN
INTRAMUSCULAR | Status: DC | PRN
  Administered 2016-05-02 (×2): 1 mg via INTRAVENOUS

## 2016-05-02 MED ORDER — IODIXANOL 320 MG/ML IV SOLN
INTRAVENOUS | Status: DC | PRN
  Administered 2016-05-02: 120 mL via INTRA_ARTERIAL

## 2016-05-02 MED ORDER — SODIUM CHLORIDE 0.9 % IV SOLN
INTRAVENOUS | Status: DC
  Administered 2016-05-02: 09:00:00 via INTRAVENOUS

## 2016-05-02 MED ORDER — CLOPIDOGREL BISULFATE 300 MG PO TABS
ORAL_TABLET | ORAL | Status: DC | PRN
  Administered 2016-05-02: 300 mg via ORAL

## 2016-05-02 MED ORDER — MIDAZOLAM HCL 2 MG/2ML IJ SOLN
INTRAMUSCULAR | Status: AC
  Filled 2016-05-02: qty 2

## 2016-05-02 MED ORDER — HEPARIN SODIUM (PORCINE) 1000 UNIT/ML IJ SOLN
INTRAMUSCULAR | Status: DC | PRN
  Administered 2016-05-02: 4000 [IU] via INTRAVENOUS
  Administered 2016-05-02: 2000 [IU] via INTRAVENOUS

## 2016-05-02 MED ORDER — SODIUM CHLORIDE 0.9 % IV SOLN
250.0000 mL | INTRAVENOUS | Status: DC | PRN
Start: 2016-05-02 — End: 2016-05-02

## 2016-05-02 MED ORDER — LIDOCAINE HCL (PF) 1 % IJ SOLN
INTRAMUSCULAR | Status: AC
  Filled 2016-05-02: qty 30

## 2016-05-02 MED ORDER — CLOPIDOGREL BISULFATE 300 MG PO TABS
ORAL_TABLET | ORAL | Status: AC
  Filled 2016-05-02: qty 1

## 2016-05-02 MED ORDER — CLOPIDOGREL BISULFATE 75 MG PO TABS: 75.0000 mg | ORAL_TABLET | Freq: Every day | ORAL | 0 refills | Status: DC

## 2016-05-02 MED ORDER — SODIUM CHLORIDE 0.9% FLUSH: 3.0000 mL | Freq: Two times a day (BID) | INTRAVENOUS | Status: DC

## 2016-05-02 MED ORDER — SODIUM CHLORIDE 0.9 % IV SOLN: 250.0000 mL | INTRAVENOUS | Status: DC | PRN

## 2016-05-02 MED ORDER — SODIUM CHLORIDE 0.9 % IV SOLN: INTRAVENOUS | Status: AC

## 2016-05-02 SURGICAL SUPPLY — 18 items
BAG SNAP BAND KOVER 36X36 (MISCELLANEOUS) ×2 IMPLANT
BALLN COYOTE ES OTW 4X40X145 (BALLOONS) ×5
BALLN IN.PACT DCB 5X60 (BALLOONS) ×5
BALLOON COYOTE ES OTW 4X40X145 (BALLOONS) IMPLANT
CATH ANGIO 5F PIGTAIL 65CM (CATHETERS) ×2 IMPLANT
CATH TEMPO AQUA 5F 100CM (CATHETERS) ×2 IMPLANT
DCB IN.PACT 5X60 (BALLOONS) IMPLANT
GUIDEWIRE STR TIP .014X300X8 (WIRE) ×2 IMPLANT
KIT ENCORE 26 ADVANTAGE (KITS) ×2 IMPLANT
KIT MICROINTRODUCER STIFF 5F (SHEATH) ×2 IMPLANT
KIT PV (KITS) ×5 IMPLANT
SHEATH PINNACLE 5F 10CM (SHEATH) ×2 IMPLANT
SHEATH PINNACLE ST 6F 45CM (SHEATH) ×2 IMPLANT
SYRINGE MEDRAD AVANTA MACH 7 (SYRINGE) ×2 IMPLANT
TAPE RADIOPAQUE TURBO (MISCELLANEOUS) ×2 IMPLANT
TRANSDUCER W/STOPCOCK (MISCELLANEOUS) ×5 IMPLANT
TRAY PV CATH (CUSTOM PROCEDURE TRAY) ×5 IMPLANT
WIRE HITORQ VERSACORE ST 145CM (WIRE) ×2 IMPLANT

## 2016-05-02 NOTE — Interval H&P Note (Signed)
History and Physical Interval Note:  05/02/2016 9:57 AM  William [name]  has presented today for surgery, with the diagnosis of PAD  The various methods of treatment have been discussed with the patient and family. After consideration of risks, benefits and other options for treatment, the patient has consented to  Procedure(s): Abdominal Aortogram w/Lower Extremity (N/A) as a surgical intervention .  The patient's history has been reviewed, patient examined, no change in status, stable for surgery.  I have reviewed the patient's chart and labs.  Questions were answered to the patient's satisfaction.     [name]

## 2016-05-02 NOTE — Progress Notes (Signed)
Arterial sheath removed from left groin. Manual pressure applied 20 minutes. No complications. Education provided and pt verbalized understanding. Site is level zero with no bruising.   Bed rest starts at 1310.   Will continue to monitor.   Earlie Lou

## 2016-05-02 NOTE — Discharge Instructions (Signed)
Resume Warfarin tonight.  Start Plavix 75 mg once daily on 11/02 for 1 month Hold Aspirin while you are on Plavix.     Groin Surgical Site Care Refer to this sheet in the next few weeks. These instructions provide you with information about caring for yourself after your procedure. Your health care provider may also give you more specific instructions. Your treatment has been planned according to current medical practices, but problems sometimes occur. Call your health care provider if you have any problems or questions after your procedure. WHAT TO EXPECT AFTER THE PROCEDURE After your procedure, it is typical to have the following:  Bruising at the groin site that usually fades within 1-2 weeks.  Blood collecting in the tissue (hematoma) that may be painful to the touch. It should usually decrease in size and tenderness within 1-2 weeks. HOME CARE INSTRUCTIONS  Take medicines only as directed by your health care provider.  You may shower 24-48 hours after the procedure or as directed by your health care provider. Remove the bandage (dressing) and gently wash the site with plain soap and water. Pat the area dry with a clean towel. Do not rub the site, because this may cause bleeding.  Do not take baths, swim, or use a hot tub until your health care provider approves.  Check your insertion site every day for redness, swelling, or drainage.  Do not apply powder or lotion to the site.  Limit use of stairs to twice a day for the first 2-3 days or as directed by your health care provider.  Do not squat for the first 2-3 days or as directed by your health care provider.  Do not lift over 10 lb (4.5 kg) for 5 days after your procedure or as directed by your health care provider.  Ask your health care provider when it is okay to:  Return to work or school.  Resume usual physical activities or sports.  Resume sexual activity.  Do not drive home if you are discharged the same day as  the procedure. Have someone else drive you.  You may drive 24 hours after the procedure unless otherwise instructed by your health care provider.  Do not operate machinery or power tools for 24 hours after the procedure or as directed by your health care provider.  If your procedure was done as an outpatient procedure, which means that you went home the same day as your procedure, a responsible adult should be with you for the first 24 hours after you arrive home.  Keep all follow-up visits as directed by your health care provider. This is important. SEEK MEDICAL CARE IF:  You have a fever.  You have chills.  You have increased bleeding from the groin site. Hold pressure on the site. SEEK IMMEDIATE MEDICAL CARE IF:  You have unusual pain at the groin site.  You have redness, warmth, or swelling at the groin site.  You have drainage (other than a small amount of blood on the dressing) from the groin site.  The groin site is bleeding, and the bleeding does not stop after 30 minutes of holding steady pressure on the site.  Your leg or foot becomes pale, cool, tingly, or numb.   This information is not intended to replace advice given to you by your health care provider. Make sure you discuss any questions you have with your health care provider.   Document Released: 02/19/2014 Document Reviewed: 02/19/2014 Elsevier Interactive Patient Education [employer].

## 2016-05-02 NOTE — H&P (View-Only) (Signed)
Cardiology Office Note   Date:  04/17/2016   ID:  William [name], DOB 06/12/1951, MRN [id number]  PCP:  Philis Fendt, MD  Cardiologist:   [name], MD   Chief Complaint  Patient presents with  . Follow-up      History of Present Illness: William [name] is a 65 y.o. male who Is here today for a follow-up visit regarding peripheral arterial disease. He presented to see me in September for nonhealing ulcer involving the right great toe. However, during that evaluation, he was noted to have inferior ST elevation on his EKG with severe respiratory distress and a loud systolic murmur. A mechanical complication was suspected and the patient was transferred for emergent echo and cardiac catheterization which confirmed postinfarct VSD with occluded right coronary artery and mild to moderate disease involving the LAD and left circumflex. He underwent emergent one-vessel CABG and VSD repair. He had a prolonged postoperative course due to cardiogenic shock and hypotension. He had postoperative atrial fibrillation and was started on anticoagulation. He ultimately underwent amputation of the right great toe by Dr. Sharol Given. The patient has known history of diabetes. There has been some discoloration involving the tip of the second toe as well as the amputation site.  Noninvasive vascular studies in September showed mildly reduced ABI with evidence of right SFA disease and two-vessel runoff below the knee. The patient feels significantly better with resolution of dyspnea. He denies any chest pain.    Past Medical History:  Diagnosis Date  . Diabetes mellitus without complication ([place])   . Hyperlipidemia   . Hypertension     Past Surgical History:  Procedure Laterality Date  . AMPUTATION Right 03/23/2016   Procedure: 1st and 2nd Ray Amputation Right Foot;  Surgeon: Newt Minion, MD;  Location: [facility];  Service: Orthopedics;  Laterality: Right;  . CARDIAC CATHETERIZATION N/A 03/13/2016   Procedure: Right/Left Heart Cath and Coronary Angiography;  Surgeon: Sherren Mocha, MD;  Location: [facility];  Service: Cardiovascular;  Laterality: N/A;  . CARDIAC CATHETERIZATION N/A 03/13/2016   Procedure: IABP Insertion;  Surgeon: Sherren Mocha, MD;  Location: [facility];  Service: Cardiovascular;  Laterality: N/A;  . CORONARY ARTERY BYPASS GRAFT N/A 03/13/2016   Procedure: CORONARY ARTERY BYPASS GRAFTING (CABG) x 1 (SVG to OM) with EVH from [place];  Surgeon: Ivin Poot, MD;  Location: [facility];  Service: Open Heart Surgery;  Laterality: N/A;  . TEE WITHOUT CARDIOVERSION N/A 03/13/2016   Procedure: TRANSESOPHAGEAL ECHOCARDIOGRAM (TEE);  Surgeon: Ivin Poot, MD;  Location: [facility];  Service: Open Heart Surgery;  Laterality: N/A;  . VSD REPAIR N/A 03/13/2016   Procedure: VENTRICULAR SEPTAL DEFECT (VSD) REPAIR;  Surgeon: Ivin Poot, MD;  Location: [facility];  Service: Open Heart Surgery;  Laterality: N/A;     Current Outpatient Prescriptions  Medication Sig Dispense Refill  . aspirin EC 81 MG EC tablet Take 1 tablet (81 mg total) by mouth daily.    . digoxin (LANOXIN) 0.125 MG tablet Take 1 tablet (0.125 mg total) by mouth daily. 30 tablet 1  . doxycycline (ADOXA) 100 MG tablet Take 100 mg by mouth 2 (two) times daily.    . insulin detemir (LEVEMIR) 100 UNIT/ML injection Inject 40-50 Units into the skin 2 (two) times daily. Take 50 every morning  Take 40 units every evening    . midodrine (PROAMATINE) 5 MG tablet Take 1 tablet (5 mg total) by mouth 3 (  three) times daily with meals. 90 tablet 1  . oxyCODONE (OXY IR/ROXICODONE) 5 MG immediate release tablet Take 1 tablet (5 mg total) by mouth every 4 (four) hours as needed for severe pain. 28 tablet 0  . pantoprazole (PROTONIX) 40 MG tablet Take 40 mg by mouth daily.    . pramipexole (MIRAPEX) 0.125 MG tablet Take 0.125 mg by mouth at bedtime.    . rosuvastatin (CRESTOR) 40 MG tablet Take 1 tablet (40  mg total) by mouth daily. 90 tablet 0  . spironolactone (ALDACTONE) 25 MG tablet Take 1 tablet (25 mg total) by mouth daily. 30 tablet 1  . torsemide (DEMADEX) 20 MG tablet Take 1 tablet (20 mg total) by mouth 3 (three) times a week. Every Mon, Wed and Fri 30 tablet 1  . warfarin (COUMADIN) 5 MG tablet Take 1 tablet (5 mg total) by mouth one time only at 6 PM. Or as directed 30 tablet 1  . nitroGLYCERIN (NITRODUR - DOSED IN MG/24 HR) 0.4 mg/hr patch Place 0.4 mg onto the skin 3 times/day as needed-between meals & bedtime.     No current facility-administered medications for this visit.     Allergies:   Morphine and related    Social History:  The patient  reports that he has quit smoking. His smoking use included Cigarettes. He smoked 0.50 packs per day. He has never used smokeless tobacco. He reports that he does not drink alcohol or use drugs.   Family History:  Not able to obtain due to distress.  ROS:  Please see the history of present illness.   Otherwise, review of systems are positive for none.   All other systems are reviewed and negative.    PHYSICAL EXAM: VS:  BP 113/73 (BP Location: Right Arm, Patient Position: Sitting, Cuff Size: Normal)   Pulse 100   Ht 5\' 5"  (1.651 m)   Wt 146 lb (66.2 kg)   BMI 24.30 kg/m  , BMI Body mass index is 24.3 kg/m. GEN: Well nourished, well developed, in no acute distress  HEENT: normal  Neck: no JVD, carotid bruits, or masses Cardiac: RRR; no  rubs, or gallops,no edema . 3/6 holosystolic murmur at the apex radiating to the  left sternal border. Respiratory:  clear to auscultation bilaterally, normal work of breathing GI: soft, nontender, nondistended, + BS MS: no deformity or atrophy  Skin: warm and dry, no rash Neuro:  Strength and sensation are intact Psych: euthymic mood, full affect The patient has black discoloration at the tip of the second right toe and some discoloration at the amputation site.  EKG:  EKG is not ordered  today.    Recent Labs: 03/22/2016: ALT 19 03/30/2016: Magnesium 2.2 04/01/2016: Hemoglobin 8.9; Platelets 327 04/09/2016: BUN 36; Creatinine, Ser 1.39; Potassium 5.3; Sodium 132    Lipid Panel    Component Value Date/Time   CHOL 96 03/13/2016 1057   TRIG 146 03/13/2016 1057   HDL 23 (L) 03/13/2016 1057   CHOLHDL 4.2 03/13/2016 1057   VLDL 29 03/13/2016 1057   LDLCALC 44 03/13/2016 1057      Wt Readings from Last 3 Encounters:  04/17/16 146 lb (66.2 kg)  04/16/16 146 lb (66.2 kg)  04/09/16 146 lb 2 oz (66.3 kg)      No flowsheet data found.    ASSESSMENT AND PLAN:  1.  Peripheral arterial disease with critical limb ischemia involving the right foot (Rutherford class 5): the patient might require more amputation but I  think it's important to ensure optimal blood flow. Thus, I have recommended proceeding with abdominal aortogram with lower extremity runoff and possible endovascular intervention. I discussed the risks and benefits.  Hold warfarin 5 days before the procedure. Continue wound care.  2. Chronic systolic heart failure: He appears to be euvolemic. He continues to have intermittent episodes of hypotension but he might be close to being able to tolerate a small dose beta blocker in the near future. In the meantime, continue treatment with digoxin and current dose of torsemide.  3.Postoperative atrial fibrillation: Currently in sinus rhythm.  4. Coronary artery disease involving native coronary arteries without angina: Continue medical therapy.   Disposition:   follow-up with me in one month  Signed,  [name], MD  04/17/2016 5:57 PM    [place]

## 2016-05-03 ENCOUNTER — Encounter (HOSPITAL_COMMUNITY): Payer: Self-pay | Admitting: Cardiovascular Disease

## 2016-05-07 ENCOUNTER — Ambulatory Visit (INDEPENDENT_AMBULATORY_CARE_PROVIDER_SITE_OTHER): Payer: Medicare Other | Admitting: Cardiovascular Disease

## 2016-05-07 DIAGNOSIS — E119 Type 2 diabetes mellitus without complications: Secondary | ICD-10-CM | POA: Diagnosis not present

## 2016-05-07 DIAGNOSIS — Z89421 Acquired absence of other right toe(s): Secondary | ICD-10-CM | POA: Diagnosis not present

## 2016-05-07 DIAGNOSIS — Z89411 Acquired absence of right great toe: Secondary | ICD-10-CM | POA: Diagnosis not present

## 2016-05-07 DIAGNOSIS — I480 Paroxysmal atrial fibrillation: Secondary | ICD-10-CM

## 2016-05-07 DIAGNOSIS — I10 Essential (primary) hypertension: Secondary | ICD-10-CM | POA: Diagnosis not present

## 2016-05-07 DIAGNOSIS — I2510 Atherosclerotic heart disease of native coronary artery without angina pectoris: Secondary | ICD-10-CM | POA: Diagnosis not present

## 2016-05-07 DIAGNOSIS — Z4781 Encounter for orthopedic aftercare following surgical amputation: Secondary | ICD-10-CM | POA: Diagnosis not present

## 2016-05-07 DIAGNOSIS — Z5181 Encounter for therapeutic drug level monitoring: Secondary | ICD-10-CM

## 2016-05-07 LAB — POCT INR: INR: 1.4

## 2016-05-10 ENCOUNTER — Encounter (INDEPENDENT_AMBULATORY_CARE_PROVIDER_SITE_OTHER): Payer: Self-pay | Admitting: Orthopedic Surgery

## 2016-05-10 ENCOUNTER — Other Ambulatory Visit: Payer: Self-pay | Admitting: Cardiovascular Disease

## 2016-05-10 ENCOUNTER — Ambulatory Visit (INDEPENDENT_AMBULATORY_CARE_PROVIDER_SITE_OTHER): Payer: Medicare Other | Admitting: Orthopedic Surgery

## 2016-05-10 VITALS — Ht 65.0 in | Wt 144.0 lb

## 2016-05-10 DIAGNOSIS — IMO0002 Reserved for concepts with insufficient information to code with codable children: Secondary | ICD-10-CM

## 2016-05-10 DIAGNOSIS — Z89431 Acquired absence of right foot: Secondary | ICD-10-CM

## 2016-05-10 DIAGNOSIS — I739 Peripheral vascular disease, unspecified: Secondary | ICD-10-CM

## 2016-05-10 MED ORDER — SILVER SULFADIAZINE 1 % EX CREA: 1.0000 "application " | TOPICAL_CREAM | Freq: Every day | CUTANEOUS | 0 refills | Status: DC

## 2016-05-10 NOTE — Progress Notes (Signed)
Wound Care Note   Patient: William [name]           Date of Birth: [birth year]           MRN: [id number]             PCP: Philis Fendt, MD Visit Date: 05/10/2016   Assessment & Plan: Visit Diagnoses:  1. Foot amputation status, right ([place])     Plan: Have called in a prescription for Silvadene. He will continue daily wound care. Pack wound open with Silvadene and gauze. Continue nitroglycerin patches. Continue nonweightbearing  Follow-Up Instructions: Return in about 2 weeks (around 05/24/2016).  Orders:  No orders of the defined types were placed in this encounter.  Meds ordered this encounter  Medications  . silver sulfADIAZINE (SILVADENE) 1 % cream    Sig: Apply 1 application topically daily.    Dispense:  50 g    Refill:  0      Procedures: No notes on file   Clinical Data: No additional findings.   No images are attached to the encounter.   Subjective: Chief Complaint  Patient presents with  . Right Foot - Wound Check    03/23/16 right great toe and 1st and 2nd ray amputation     Patient is s/p a procedure with vascular "roto-rooter"  To beak up "my clots" His incision is open and stitches are present but not intact. He has ischemic changes to the foot and is non weight bearing in a post op shoe. Dry dressing applied. He does have HHN come out to the home several times a week. Pt in office today with compliant of feeling weak and nauseated. His care giver states that he has not been " himself" and was not able to answer simple questions yesterday such as his DOB. States his blood sugars have been " al over" today in office it is 186 after eating eggs and drinking 2 cups of coffee with sweet and low at 10:30 today. Decreased PO intake and feels " weak" he does not have a temp and is not on ABX.    Wound Check     Review of Systems  Constitutional: Negative for chills and fever.  Skin: Positive for wound.    Miscellaneous:  -Home Health Care:  no    Objective: Vital Signs: Ht 5\' 5"  (1.651 m)   Wt 144 lb (65.3 kg)   BMI 23.96 kg/m   Physical Exam: the fourth and fifth ray amputation is healing slowly. The sutures were harvested today. Eschar was debrided. There are 2 open areas remaining. Distally it has gaped. The wound is 25 mm x 1 cm. This is 4 mm deep. Does not probe. Filled in with fibrinous exudative tissue. Proximally there is a 15 mm x 10 mm wound with no depth. There is dry gangrene of hte tip of hte third toe. No surrounding erythema. No sign of infection.  Specialty Comments: No specialty comments available.   PMFS History: Patient Active Problem List   Diagnosis Date Noted  . Encounter for therapeutic drug monitoring 04/05/2016  . Hypoalbuminemia 03/21/2016  . Cardiogenic shock ([place]) 03/21/2016  . CAD (coronary artery disease), native coronary artery 03/16/2016  . VSD (ventricular septal defect) 03/16/2016  . Hx of CABG   . Paroxysmal atrial fibrillation ([place]) 03/15/2016  . Acute MI, inferoposterior wall, initial episode of care ([place]) 03/13/2016  . MI (myocardial infarction) 03/13/2016  . Erectile dysfunction associated with type 2 diabetes mellitus ([place]) 04/07/2012  .  Counseling and coordination of care 01/05/2012  . Mood disorder ([place]) 03/12/2011  . Essential hypertension 03/17/2010  . ONYCHOMYCOSIS, TOENAILS 01/03/2010  . NICOTINE ADDICTION 12/28/2008  . Hyperlipidemia 11/01/2008  . GERD 11/01/2008  . SHOULDER PAIN, LEFT, CHRONIC 11/01/2008  . NECK PAIN, CHRONIC 11/01/2008  . DM (diabetes mellitus), type 2, uncontrolled ([place]) 09/23/2008   Past Medical History:  Diagnosis Date  . Diabetes mellitus without complication ([place])   . Hyperlipidemia   . Hypertension     No family history on file. Past Surgical History:  Procedure Laterality Date  . AMPUTATION Right 03/23/2016   Procedure: 1st and 2nd Ray Amputation Right Foot;  Surgeon: Newt Minion, MD;  Location: [facility];  Service: Orthopedics;   Laterality: Right;  . CARDIAC CATHETERIZATION N/A 03/13/2016   Procedure: Right/Left Heart Cath and Coronary Angiography;  Surgeon: Sherren Mocha, MD;  Location: [facility];  Service: Cardiovascular;  Laterality: N/A;  . CARDIAC CATHETERIZATION N/A 03/13/2016   Procedure: IABP Insertion;  Surgeon: Sherren Mocha, MD;  Location: [facility];  Service: Cardiovascular;  Laterality: N/A;  . CORONARY ARTERY BYPASS GRAFT N/A 03/13/2016   Procedure: CORONARY ARTERY BYPASS GRAFTING (CABG) x 1 (SVG to OM) with EVH from [place];  Surgeon: Ivin Poot, MD;  Location: [facility];  Service: Open Heart Surgery;  Laterality: N/A;  . LOWER EXTREMITY ANGIOGRAM  05/02/2016   Procedure: Lower Extremity Angiogram;  Surgeon: Wellington Hampshire, MD;  Location: [facility];  Service: Cardiovascular;;  Limited left femoral runoff right femoral runoff  . PERIPHERAL VASCULAR CATHETERIZATION N/A 05/02/2016   Procedure: Abdominal Aortogram;  Surgeon: Wellington Hampshire, MD;  Location: [facility];  Service: Cardiovascular;  Laterality: N/A;  . PERIPHERAL VASCULAR CATHETERIZATION Right 05/02/2016   Procedure: Peripheral Vascular Balloon Angioplasty;  Surgeon: Wellington Hampshire, MD;  Location: [facility];  Service: Cardiovascular;  Laterality: Right;  SFA  . TEE WITHOUT CARDIOVERSION N/A 03/13/2016   Procedure: TRANSESOPHAGEAL ECHOCARDIOGRAM (TEE);  Surgeon: Ivin Poot, MD;  Location: [facility];  Service: Open Heart Surgery;  Laterality: N/A;  . VSD REPAIR N/A 03/13/2016   Procedure: VENTRICULAR SEPTAL DEFECT (VSD) REPAIR;  Surgeon: Ivin Poot, MD;  Location: [facility];  Service: Open Heart Surgery;  Laterality: N/A;   Social History   Occupational History  . Not on file.   Social History Main Topics  . Smoking status: Former Smoker    Packs/day: 0.50    Types: Cigarettes  . Smokeless tobacco: Never Used  . Alcohol use No  . Drug use: No  . Sexual activity: Not on file

## 2016-05-14 ENCOUNTER — Ambulatory Visit (INDEPENDENT_AMBULATORY_CARE_PROVIDER_SITE_OTHER): Payer: Medicare Other | Admitting: Cardiovascular Disease

## 2016-05-14 DIAGNOSIS — E119 Type 2 diabetes mellitus without complications: Secondary | ICD-10-CM | POA: Diagnosis not present

## 2016-05-14 DIAGNOSIS — Z89421 Acquired absence of other right toe(s): Secondary | ICD-10-CM | POA: Diagnosis not present

## 2016-05-14 DIAGNOSIS — Z89411 Acquired absence of right great toe: Secondary | ICD-10-CM | POA: Diagnosis not present

## 2016-05-14 DIAGNOSIS — I480 Paroxysmal atrial fibrillation: Secondary | ICD-10-CM

## 2016-05-14 DIAGNOSIS — Z5181 Encounter for therapeutic drug level monitoring: Secondary | ICD-10-CM

## 2016-05-14 DIAGNOSIS — I10 Essential (primary) hypertension: Secondary | ICD-10-CM | POA: Diagnosis not present

## 2016-05-14 DIAGNOSIS — Z4781 Encounter for orthopedic aftercare following surgical amputation: Secondary | ICD-10-CM | POA: Diagnosis not present

## 2016-05-14 DIAGNOSIS — I2510 Atherosclerotic heart disease of native coronary artery without angina pectoris: Secondary | ICD-10-CM | POA: Diagnosis not present

## 2016-05-14 LAB — POCT INR: INR: 1.9

## 2016-05-16 ENCOUNTER — Ambulatory Visit (HOSPITAL_COMMUNITY)
Admission: RE | Admit: 2016-05-16 | Discharge: 2016-05-16 | Disposition: A | Discharge status: Home / Self Care | Payer: Medicare Other | Source: Ambulatory Visit | Attending: Cardiology | Admitting: Cardiology

## 2016-05-16 DIAGNOSIS — I739 Peripheral vascular disease, unspecified: Secondary | ICD-10-CM | POA: Diagnosis not present

## 2016-05-21 ENCOUNTER — Ambulatory Visit (INDEPENDENT_AMBULATORY_CARE_PROVIDER_SITE_OTHER): Payer: Medicare Other | Admitting: Internal Medicine

## 2016-05-21 DIAGNOSIS — E119 Type 2 diabetes mellitus without complications: Secondary | ICD-10-CM | POA: Diagnosis not present

## 2016-05-21 DIAGNOSIS — I10 Essential (primary) hypertension: Secondary | ICD-10-CM | POA: Diagnosis not present

## 2016-05-21 DIAGNOSIS — I480 Paroxysmal atrial fibrillation: Secondary | ICD-10-CM

## 2016-05-21 DIAGNOSIS — Z4781 Encounter for orthopedic aftercare following surgical amputation: Secondary | ICD-10-CM | POA: Diagnosis not present

## 2016-05-21 DIAGNOSIS — I2510 Atherosclerotic heart disease of native coronary artery without angina pectoris: Secondary | ICD-10-CM | POA: Diagnosis not present

## 2016-05-21 DIAGNOSIS — Z89411 Acquired absence of right great toe: Secondary | ICD-10-CM | POA: Diagnosis not present

## 2016-05-21 DIAGNOSIS — Z89421 Acquired absence of other right toe(s): Secondary | ICD-10-CM | POA: Diagnosis not present

## 2016-05-21 DIAGNOSIS — Z5181 Encounter for therapeutic drug level monitoring: Secondary | ICD-10-CM

## 2016-05-21 LAB — POCT INR: INR: 2

## 2016-05-22 ENCOUNTER — Ambulatory Visit (INDEPENDENT_AMBULATORY_CARE_PROVIDER_SITE_OTHER): Payer: Medicare Other | Admitting: Orthopedic Surgery

## 2016-05-22 DIAGNOSIS — I70234 Atherosclerosis of native arteries of right leg with ulceration of heel and midfoot: Secondary | ICD-10-CM

## 2016-05-22 MED ORDER — GABAPENTIN 300 MG PO CAPS: 300.0000 mg | ORAL_CAPSULE | Freq: Three times a day (TID) | ORAL | 3 refills | Status: DC

## 2016-05-22 MED ORDER — OXYCODONE-ACETAMINOPHEN 5-325 MG PO TABS: 1.0000 | ORAL_TABLET | Freq: Four times a day (QID) | ORAL | 0 refills | Status: DC | PRN

## 2016-05-22 MED ORDER — NITROGLYCERIN 0.4 MG/HR TD PT24: 0.4000 mg | MEDICATED_PATCH | Freq: Every day | TRANSDERMAL | 3 refills | Status: DC

## 2016-05-22 NOTE — Progress Notes (Signed)
Office Visit Note   Patient: William [name]           Date of Birth: 05/09/1951           MRN: [id number] Visit Date: 05/22/2016              Requested by: Nolene Ebbs, MD [address] [place], [place] [address] PCP: Philis Fendt, MD   Assessment & Plan: Visit Diagnoses:  1. Atherosclerosis of native artery of right lower extremity with ulceration of midfoot ([name])     Plan: Continue Silvadene dressing changes continue nitroglycerin patch continued protected weightbearing. Patient has seen vascular vein specialist and they felt that his macro circulation was adequate with no reconstructable vessels. Patient's problem is primarily microcirculatory and does have neuropathic pain we will refill his prescription for Percocet and start a prescription for Neurontin we will start with 300 mg daily at bedtime and may increase to 3 times a day as needed. Patient cannot take nonsteroidals or Tylenol.  Follow-Up Instructions: Return in about 2 weeks (around 06/05/2016).   Orders:  No orders of the defined types were placed in this encounter.  Meds ordered this encounter  Medications  . oxyCODONE-acetaminophen (PERCOCET/ROXICET) 5-325 MG tablet    Sig: Take 1 tablet by mouth every 6 (six) hours as needed for severe pain.    Dispense:  30 tablet    Refill:  0  . gabapentin (NEURONTIN) 300 MG capsule    Sig: Take 1 capsule (300 mg total) by mouth 3 (three) times daily. 3 times a day when necessary neuropathy pain    Dispense:  90 capsule    Refill:  3      Procedures: No procedures performed   Clinical Data: No additional findings.   Subjective: Chief Complaint  Patient presents with  . Right Foot - Follow-up    Patient here today follow-up from right great 1st and 2nd ray amputation. Blackness and loss of toenail on 3rd toenail. Having some swelling and redness.     Review of Systems   Objective: Vital Signs: There were no vitals taken for this visit.  Physical  Exam examination patient has mild ischemic changes to the tip of the second toe as well ischemic changes to the 2 wounds over the first ray amputation. Necrotic tissue was debrided with a 10 blade knife. There is good granulation tissue in the most proximal ulcer distally there is some nonviable tissue. There is no cellulitis no purulence no odor no signs of infection.  Ortho Exam  Specialty Comments:  No specialty comments available.  Imaging: No results found.   PMFS History: Patient Active Problem List   Diagnosis Date Noted  . Encounter for therapeutic drug monitoring 04/05/2016  . Hypoalbuminemia 03/21/2016  . Cardiogenic shock ([place]) 03/21/2016  . CAD (coronary artery disease), native coronary artery 03/16/2016  . VSD (ventricular septal defect) 03/16/2016  . Hx of CABG   . Paroxysmal atrial fibrillation ([place]) 03/15/2016  . Acute MI, inferoposterior wall, initial episode of care ([place]) 03/13/2016  . MI (myocardial infarction) 03/13/2016  . Erectile dysfunction associated with type 2 diabetes mellitus ([place]) 04/07/2012  . Counseling and coordination of care 01/05/2012  . Mood disorder ([place]) 03/12/2011  . Essential hypertension 03/17/2010  . ONYCHOMYCOSIS, TOENAILS 01/03/2010  . NICOTINE ADDICTION 12/28/2008  . Hyperlipidemia 11/01/2008  . GERD 11/01/2008  . SHOULDER PAIN, LEFT, CHRONIC 11/01/2008  . NECK PAIN, CHRONIC 11/01/2008  . DM (diabetes mellitus), type 2, uncontrolled ([place]) 09/23/2008  Past Medical History:  Diagnosis Date  . Diabetes mellitus without complication ([place])   . Hyperlipidemia   . Hypertension     No family history on file.  Past Surgical History:  Procedure Laterality Date  . AMPUTATION Right 03/23/2016   Procedure: 1st and 2nd Ray Amputation Right Foot;  Surgeon: Newt Minion, MD;  Location: [facility];  Service: Orthopedics;  Laterality: Right;  . CARDIAC CATHETERIZATION N/A 03/13/2016   Procedure: Right/Left Heart Cath and Coronary Angiography;   Surgeon: Sherren Mocha, MD;  Location: [facility];  Service: Cardiovascular;  Laterality: N/A;  . CARDIAC CATHETERIZATION N/A 03/13/2016   Procedure: IABP Insertion;  Surgeon: Sherren Mocha, MD;  Location: [facility];  Service: Cardiovascular;  Laterality: N/A;  . CORONARY ARTERY BYPASS GRAFT N/A 03/13/2016   Procedure: CORONARY ARTERY BYPASS GRAFTING (CABG) x 1 (SVG to OM) with EVH from [place];  Surgeon: Ivin Poot, MD;  Location: [facility];  Service: Open Heart Surgery;  Laterality: N/A;  . LOWER EXTREMITY ANGIOGRAM  05/02/2016   Procedure: Lower Extremity Angiogram;  Surgeon: Wellington Hampshire, MD;  Location: [facility];  Service: Cardiovascular;;  Limited left femoral runoff right femoral runoff  . PERIPHERAL VASCULAR CATHETERIZATION N/A 05/02/2016   Procedure: Abdominal Aortogram;  Surgeon: Wellington Hampshire, MD;  Location: [facility];  Service: Cardiovascular;  Laterality: N/A;  . PERIPHERAL VASCULAR CATHETERIZATION Right 05/02/2016   Procedure: Peripheral Vascular Balloon Angioplasty;  Surgeon: Wellington Hampshire, MD;  Location: [facility];  Service: Cardiovascular;  Laterality: Right;  SFA  . TEE WITHOUT CARDIOVERSION N/A 03/13/2016   Procedure: TRANSESOPHAGEAL ECHOCARDIOGRAM (TEE);  Surgeon: Ivin Poot, MD;  Location: [facility];  Service: Open Heart Surgery;  Laterality: N/A;  . VSD REPAIR N/A 03/13/2016   Procedure: VENTRICULAR SEPTAL DEFECT (VSD) REPAIR;  Surgeon: Ivin Poot, MD;  Location: [facility];  Service: Open Heart Surgery;  Laterality: N/A;   Social History   Occupational History  . Not on file.   Social History Main Topics  . Smoking status: Former Smoker    Packs/day: 0.50    Types: Cigarettes  . Smokeless tobacco: Never Used  . Alcohol use No  . Drug use: No  . Sexual activity: Not on file

## 2016-05-22 NOTE — Addendum Note (Signed)
Addended by: Meridee Score on: 05/22/2016 04:46 PM   Modules accepted: Orders

## 2016-05-23 ENCOUNTER — Encounter: Payer: Self-pay | Admitting: *Deleted

## 2016-05-28 ENCOUNTER — Ambulatory Visit (HOSPITAL_COMMUNITY)
Admission: RE | Admit: 2016-05-28 | Discharge: 2016-05-28 | Disposition: A | Discharge status: Home / Self Care | Payer: Medicare Other | Source: Ambulatory Visit | Attending: Cardiology | Admitting: Cardiology

## 2016-05-28 ENCOUNTER — Ambulatory Visit (INDEPENDENT_AMBULATORY_CARE_PROVIDER_SITE_OTHER): Payer: Medicare Other | Admitting: Cardiovascular Disease

## 2016-05-28 VITALS — BP 136/88 | HR 107 | Wt 144.6 lb

## 2016-05-28 DIAGNOSIS — E1165 Type 2 diabetes mellitus with hyperglycemia: Secondary | ICD-10-CM

## 2016-05-28 DIAGNOSIS — I5022 Chronic systolic (congestive) heart failure: Secondary | ICD-10-CM | POA: Diagnosis not present

## 2016-05-28 DIAGNOSIS — I2510 Atherosclerotic heart disease of native coronary artery without angina pectoris: Secondary | ICD-10-CM | POA: Insufficient documentation

## 2016-05-28 DIAGNOSIS — E118 Type 2 diabetes mellitus with unspecified complications: Secondary | ICD-10-CM

## 2016-05-28 DIAGNOSIS — Z9889 Other specified postprocedural states: Secondary | ICD-10-CM | POA: Diagnosis not present

## 2016-05-28 DIAGNOSIS — Z4781 Encounter for orthopedic aftercare following surgical amputation: Secondary | ICD-10-CM | POA: Diagnosis not present

## 2016-05-28 DIAGNOSIS — I25110 Atherosclerotic heart disease of native coronary artery with unstable angina pectoris: Secondary | ICD-10-CM | POA: Diagnosis not present

## 2016-05-28 DIAGNOSIS — I480 Paroxysmal atrial fibrillation: Secondary | ICD-10-CM

## 2016-05-28 DIAGNOSIS — Z7901 Long term (current) use of anticoagulants: Secondary | ICD-10-CM | POA: Insufficient documentation

## 2016-05-28 DIAGNOSIS — Z951 Presence of aortocoronary bypass graft: Secondary | ICD-10-CM | POA: Diagnosis not present

## 2016-05-28 DIAGNOSIS — E785 Hyperlipidemia, unspecified: Secondary | ICD-10-CM | POA: Insufficient documentation

## 2016-05-28 DIAGNOSIS — Q210 Ventricular septal defect: Secondary | ICD-10-CM | POA: Insufficient documentation

## 2016-05-28 DIAGNOSIS — Z89421 Acquired absence of other right toe(s): Secondary | ICD-10-CM | POA: Diagnosis not present

## 2016-05-28 DIAGNOSIS — I10 Essential (primary) hypertension: Secondary | ICD-10-CM | POA: Diagnosis not present

## 2016-05-28 DIAGNOSIS — Z794 Long term (current) use of insulin: Secondary | ICD-10-CM | POA: Insufficient documentation

## 2016-05-28 DIAGNOSIS — I110 Hypertensive heart disease with heart failure: Secondary | ICD-10-CM | POA: Insufficient documentation

## 2016-05-28 DIAGNOSIS — Z5181 Encounter for therapeutic drug level monitoring: Secondary | ICD-10-CM

## 2016-05-28 DIAGNOSIS — E119 Type 2 diabetes mellitus without complications: Secondary | ICD-10-CM | POA: Diagnosis not present

## 2016-05-28 DIAGNOSIS — Z89411 Acquired absence of right great toe: Secondary | ICD-10-CM | POA: Diagnosis not present

## 2016-05-28 DIAGNOSIS — I951 Orthostatic hypotension: Secondary | ICD-10-CM | POA: Diagnosis not present

## 2016-05-28 DIAGNOSIS — Z87891 Personal history of nicotine dependence: Secondary | ICD-10-CM | POA: Insufficient documentation

## 2016-05-28 LAB — BASIC METABOLIC PANEL
Anion gap: 8 (ref 5–15)
BUN: 19 mg/dL (ref 6–20)
CHLORIDE: 102 mmol/L (ref 101–111)
CO2: 28 mmol/L (ref 22–32)
CREATININE: 1.53 mg/dL — AB (ref 0.61–1.24)
Calcium: 9.7 mg/dL (ref 8.9–10.3)
GFR calc Af Amer: 53 mL/min — ABNORMAL LOW (ref >60)
GFR calc non Af Amer: 46 mL/min — ABNORMAL LOW (ref >60)
GLUCOSE: 298 mg/dL — AB (ref 65–99)
Potassium: 4.4 mmol/L (ref 3.5–5.1)
SODIUM: 138 mmol/L (ref 135–145)

## 2016-05-28 LAB — POCT INR: INR: 2.2

## 2016-05-28 MED ORDER — DIGOXIN 125 MCG PO TABS: 0.1250 mg | ORAL_TABLET | Freq: Every day | ORAL | 3 refills | Status: DC

## 2016-05-28 MED ORDER — WARFARIN SODIUM 5 MG PO TABS: 5.0000 mg | ORAL_TABLET | Freq: Every day | ORAL | 0 refills | Status: DC

## 2016-05-28 MED ORDER — TORSEMIDE 20 MG PO TABS
20.0000 mg | ORAL_TABLET | ORAL | 3 refills | Status: DC
Start: 2016-05-28 — End: 2016-09-21

## 2016-05-28 MED ORDER — CARVEDILOL 3.125 MG PO TABS: 3.1250 mg | ORAL_TABLET | Freq: Two times a day (BID) | ORAL | 3 refills | Status: DC

## 2016-05-28 MED ORDER — MIDODRINE HCL 5 MG PO TABS: 5.0000 mg | ORAL_TABLET | Freq: Three times a day (TID) | ORAL | 3 refills | Status: DC

## 2016-05-28 MED ORDER — SPIRONOLACTONE 25 MG PO TABS: 25.0000 mg | ORAL_TABLET | Freq: Every day | ORAL | 3 refills | Status: DC

## 2016-05-28 NOTE — Progress Notes (Signed)
Advanced Heart Failure Clinic Note   PCP: Dr Pecolia Ades Primary HF Cardiologist: Dr Haroldine Laws Orthopedic: [name] Cardiac Surgeon: Dr Lawson Fiscal  HPI: [name] is a 65 year old with a history of CAD with inferior MI complicated by acute VSD and cardiogenic shock, S/P CABG x1 with VSD repair on 03/13/16, S/P R 1st and 2nd toe amputation 03/24/2016, DMII, PAF, and hyperlipidemia.    Admitted September 2017 with chest pain. Inferior MI c/b acute VSD and cardiogenic shock required CABG x 1 with VSD repair on 03/13/2016. Post op course prolonged due to AF and cardiogenic shock due to RV failure  Slow wean off milrinone due low mixed venous saturation. Also had amputation of R 1st and 2nd toe for osteo.  Discharge weight was 150 pounds. He was not discharged on bb or ace with hypotension.   He presents today for regular follow up.  Very seldomly dizzy any more.  Main complaint is constipation, says it makes him nauseated. Denies SOB/CP/PND/Orthopnea.  No coughing.  Still gets tired easy.  Walking more. Limited by back pain. Has stopped smoking completely. Has been off since his admission in September. Lives with his wife and daughter. On November 1 had LE angiogram with successful balloon angioplasty. Sees Dr. Fletcher Anon tomorrow.     ECHO 03/19/2016 EF 40-45%. RV normal  Labs 04/02/2016: K 4.2 Creatinine 1.41   Peripheral angioplasty and abdominal aortogram 05/02/16 with No significant aortoiliac disease, Significant right distal SFA stenosis, One-vessel runoff below the knee on the right via the peroneal artery which gives collaterals distally to reconstitutes part of the procedure tibial and dorsalis pedis,  Successful angioplasty followed by drug-coated balloon angioplasty of the right distal SFA.  ROS: All systems negative except as listed in HPI, PMH and Problem List.  SH:  Social History   Social History  . Marital status: Married    Spouse name: Lavell Luster  . Number of children: N/A  . Years of  education: N/A   Occupational History  . Not on file.   Social History Main Topics  . Smoking status: Former Smoker    Packs/day: 0.50    Types: Cigarettes  . Smokeless tobacco: Never Used  . Alcohol use No  . Drug use: No  . Sexual activity: Not on file   Other Topics Concern  . Not on file   Social History Narrative  . No narrative on file    FH: No family history on file.  Past Medical History:  Diagnosis Date  . Diabetes mellitus without complication ([place])   . Hyperlipidemia   . Hypertension     Current Outpatient Prescriptions  Medication Sig Dispense Refill  . clopidogrel (PLAVIX) 75 MG tablet Take 1 tablet (75 mg total) by mouth daily. 30 tablet 0  . digoxin (LANOXIN) 0.125 MG tablet Take 1 tablet (0.125 mg total) by mouth daily. 30 tablet 1  . docusate sodium (COLACE) 100 MG capsule Take 100 mg by mouth 2 (two) times daily.    [name] gabapentin (NEURONTIN) 300 MG capsule Take 300 mg by mouth 3 (three) times daily as needed (neuropathy pain).    . insulin detemir (LEVEMIR) 100 UNIT/ML injection Inject 40-50 Units into the skin 2 (two) times daily. Take 50 every morning  Take 40 units every evening    . midodrine (PROAMATINE) 5 MG tablet Take 1 tablet (5 mg total) by mouth 3 (three) times daily with meals. 90 tablet 1  . nitroGLYCERIN (NITRODUR - DOSED IN MG/24 HR) 0.4 mg/hr  patch Place 1 patch (0.4 mg total) onto the skin daily. 30 patch 3  . oxyCODONE-acetaminophen (PERCOCET/ROXICET) 5-325 MG tablet Take 1 tablet by mouth every 6 (six) hours as needed for severe pain. 30 tablet 0  . pantoprazole (PROTONIX) 40 MG tablet Take 40 mg by mouth daily.    . pramipexole (MIRAPEX) 0.125 MG tablet Take 0.125 mg by mouth at bedtime.    . rosuvastatin (CRESTOR) 40 MG tablet Take 1 tablet (40 mg total) by mouth daily. 90 tablet 0  . silver sulfADIAZINE (SILVADENE) 1 % cream Apply 1 application topically daily. 50 g 0  . spironolactone (ALDACTONE) 25 MG tablet Take 1 tablet (25 mg  total) by mouth daily. 30 tablet 1  . torsemide (DEMADEX) 20 MG tablet Take 1 tablet (20 mg total) by mouth 3 (three) times a week. Every Mon, Wed and Fri 30 tablet 1  . warfarin (COUMADIN) 5 MG tablet Take 1 tablet (5 mg total) by mouth one time only at 6 PM. Or as directed 30 tablet 1   No current facility-administered medications for this encounter.     Vitals:   05/28/16 1047  BP: 136/88  Pulse: (!) 107  SpO2: 94%  Weight: 144 lb 9.6 oz (65.6 kg)   Wt Readings from Last 3 Encounters:  05/28/16 144 lb 9.6 oz (65.6 kg)  05/10/16 144 lb (65.3 kg)  05/02/16 144 lb (65.3 kg)     PHYSICAL EXAM:  General: NAD, Daughter present HEENT: Normal Neck: supple. JVP flat. Carotids 2+ bilaterally; no bruits. No thyromegaly or nodule noted.  Cor: PMI normal. Tachy and regular . No M/G/R. Sternal scar.  Lungs: CTAB, normal effort.  Abdomen: soft, NT, ND, no HSM. No bruits or masses. +BS  Extremities: no cyanosis, clubbing, rash, edema R foot off loading shoe. R foot dressing.  R third toe tip black Neuro: alert & orientedx3, cranial nerves grossly intact. Moves all 4 extremities w/o difficulty. Affect pleasant.  EKG Sinus Tach 106 bpm   ASSESSMENT & PLAN: 1. Inerior STEMI CAD-->S/P CABG x1 VSD Repair with RV failure On statin and aspirin. Unable to go to CR with PAD and healing wounds at this time.  2. Chronic Systolic Heart Failure- Recent cardiogenic shock with RV failure: EF 40-45% on 9/17 echo.  NYHA II-III.  Volume status stable. Remains slightly tachy.  Continue spiro 25 mg daily.  Continue digoxin 0.125 mg daily.  Level 0.5 at 03/2016. Will add low dose coreg 3.125 mg BID. Continue midodrine for now.  3. PAD Ischemic R Great Toe s/p amputation 9/22- Wound care per [name].  - s/p LE angio with Dr Fletcher Anon as above.  4. Hyperlipidemia- Continue statin.  5. DM Type II-  Per PCP 6. PAF - EKG today shows he is maintaining NSR, though slightly tachy. - On coumadin. INR followed by  [facility].  7. RV failure 8. Orthostatic hypotension --Continue midodrine for now  Looking good. Adding low dose coreg as above. Follow up with pharmacy in 2 weeks. Follow up with MD in 6-8 weeks. Should consider CR as leg heals.   [name] Friar, PA-C 11:05 AM

## 2016-05-28 NOTE — Patient Instructions (Signed)
Routine lab work today. Will notify you of abnormal results, otherwise no news is good news!  Refills sent on Coumadin, Spirolactone, Torsemide, Midodrine, Digoxin.  START Carvedilol (Coreg) 3.125 mg tablet twice daily (once every 12 hours).  Follow up 2 weeks with CHF Clinical Pharmacist Ileene Patrick.  Follow up in 6-8 weeks with Dr. Haroldine Laws.  Do the following things EVERYDAY: 1) Weigh yourself in the morning before breakfast. Write it down and keep it in a log. 2) Take your medicines as prescribed 3) Eat low salt foods-Limit salt (sodium) to 2000 mg per day.  4) Stay as active as you can everyday 5) Limit all fluids for the day to less than 2 liters

## 2016-05-28 NOTE — Progress Notes (Signed)
Advanced Heart Failure Medication Review by a Pharmacist  Does the patient  feel that his/her medications are working for him/her?  yes  Has the patient been experiencing any side effects to the medications prescribed?  no  Does the patient measure his/her own blood pressure or blood glucose at home?  yes   Does the patient have any problems obtaining medications due to transportation or finances?   no  Understanding of regimen: good Understanding of indications: good Potential of compliance: good Patient understands to avoid NSAIDs. Patient understands to avoid decongestants.  Issues to address at subsequent visits: None   Pharmacist comments: William [name] is a pleasant 65 yo M presenting with his daughter and his medication bottles. He reports good compliance with his regimen and did not have any specific medication-related questions or concerns for me at this time.   Ruta Hinds. Velva Harman, PharmD, BCPS, CPP Clinical Pharmacist Pager: [phone] Phone: [phone] 05/28/2016 11:11 AM      Time with patient: 10 minutes Preparation and documentation time: 2 minutes Total time: 12 minutes

## 2016-05-29 ENCOUNTER — Ambulatory Visit (INDEPENDENT_AMBULATORY_CARE_PROVIDER_SITE_OTHER): Payer: Medicare Other | Admitting: Cardiovascular Disease

## 2016-05-29 ENCOUNTER — Encounter: Payer: Self-pay | Admitting: Cardiovascular Disease

## 2016-05-29 VITALS — BP 104/59 | HR 90 | Ht 65.0 in | Wt 144.0 lb

## 2016-05-29 DIAGNOSIS — I2119 ST elevation (STEMI) myocardial infarction involving other coronary artery of inferior wall: Secondary | ICD-10-CM | POA: Diagnosis not present

## 2016-05-29 DIAGNOSIS — I480 Paroxysmal atrial fibrillation: Secondary | ICD-10-CM | POA: Diagnosis not present

## 2016-05-29 DIAGNOSIS — I5022 Chronic systolic (congestive) heart failure: Secondary | ICD-10-CM

## 2016-05-29 DIAGNOSIS — I2510 Atherosclerotic heart disease of native coronary artery without angina pectoris: Secondary | ICD-10-CM | POA: Diagnosis not present

## 2016-05-29 DIAGNOSIS — I739 Peripheral vascular disease, unspecified: Secondary | ICD-10-CM | POA: Diagnosis not present

## 2016-05-29 NOTE — Progress Notes (Signed)
Cardiology Office Note   Date:  05/29/2016   ID:  William [name], DOB [birth year], MRN [id number]  PCP:  Philis Fendt, MD  Cardiologist:   [name], MD   Chief Complaint  Patient presents with  . Follow-up      History of Present Illness: William [name] is a 65 y.o. male who Is here today for a follow-up visit regarding peripheral arterial disease. He presented to see me in September for nonhealing ulcer involving the right great toe.  He has known history of inferior myocardial infarction complicated by VSD in September. He is status post one-vessel CABG and VSD repair. He had postoperative atrial fibrillation and was started on anticoagulation. He had amputation of the right great toe by Dr. Sharol Given while hospitalized. The patient has known history of diabetes. There has been some discoloration involving the tip of the second toe as well as the amputation site.  I proceeded with angiography early this month which showed no significant aortoiliac disease, significant right distal SFA stenosis and one-vessel runoff below the knee via the peroneal artery with reconstitution of the dorsalis pedis distally. I performed successful drug-coated balloon angioplasty of the right SFA. The amputation site is healing better. He does have gangrenous changes on the right second toe which has been stable overall. He denies any chest pain. His shortness of breath is gradually improving.    Past Medical History:  Diagnosis Date  . Diabetes mellitus without complication ([place])   . Hyperlipidemia   . Hypertension     Past Surgical History:  Procedure Laterality Date  . AMPUTATION Right 03/23/2016   Procedure: 1st and 2nd Ray Amputation Right Foot;  Surgeon: Newt Minion, MD;  Location: [place];  Service: Orthopedics;  Laterality: Right;  . CARDIAC CATHETERIZATION N/A 03/13/2016   Procedure: Right/Left Heart Cath and Coronary Angiography;  Surgeon: Sherren Mocha, MD;  Location: [place] [facility];   Service: Cardiovascular;  Laterality: N/A;  . CARDIAC CATHETERIZATION N/A 03/13/2016   Procedure: IABP Insertion;  Surgeon: Sherren Mocha, MD;  Location: [place] [facility];  Service: Cardiovascular;  Laterality: N/A;  . CORONARY ARTERY BYPASS GRAFT N/A 03/13/2016   Procedure: CORONARY ARTERY BYPASS GRAFTING (CABG) x 1 (SVG to OM) with EVH from [place];  Surgeon: Ivin Poot, MD;  Location: [place];  Service: Open Heart Surgery;  Laterality: N/A;  . LOWER EXTREMITY ANGIOGRAM  05/02/2016   Procedure: Lower Extremity Angiogram;  Surgeon: Wellington Hampshire, MD;  Location: [place] [facility];  Service: Cardiovascular;;  Limited left femoral runoff right femoral runoff  . PERIPHERAL VASCULAR CATHETERIZATION N/A 05/02/2016   Procedure: Abdominal Aortogram;  Surgeon: Wellington Hampshire, MD;  Location: [place] [facility];  Service: Cardiovascular;  Laterality: N/A;  . PERIPHERAL VASCULAR CATHETERIZATION Right 05/02/2016   Procedure: Peripheral Vascular Balloon Angioplasty;  Surgeon: Wellington Hampshire, MD;  Location: [place] [facility];  Service: Cardiovascular;  Laterality: Right;  SFA  . TEE WITHOUT CARDIOVERSION N/A 03/13/2016   Procedure: TRANSESOPHAGEAL ECHOCARDIOGRAM (TEE);  Surgeon: Ivin Poot, MD;  Location: [place];  Service: Open Heart Surgery;  Laterality: N/A;  . VSD REPAIR N/A 03/13/2016   Procedure: VENTRICULAR SEPTAL DEFECT (VSD) REPAIR;  Surgeon: Ivin Poot, MD;  Location: [place];  Service: Open Heart Surgery;  Laterality: N/A;     Current Outpatient Prescriptions  Medication Sig Dispense Refill  . carvedilol (COREG) 3.125 MG tablet Take 1 tablet (3.125 mg total) by  mouth 2 (two) times daily. 180 tablet 3  . clopidogrel (PLAVIX) 75 MG tablet Take 1 tablet (75 mg total) by mouth daily. 30 tablet 0  . digoxin (LANOXIN) 0.125 MG tablet Take 1 tablet (0.125 mg total) by mouth daily. 30 tablet 3  . docusate sodium (COLACE) 100 MG capsule Take 100 mg by mouth 2 (two)  times daily.    [name] gabapentin (NEURONTIN) 300 MG capsule Take 300 mg by mouth daily.     . insulin detemir (LEVEMIR) 100 UNIT/ML injection Inject 40-50 Units into the skin 2 (two) times daily. Take 50 every morning  Take 40 units every evening    . midodrine (PROAMATINE) 5 MG tablet Take 1 tablet (5 mg total) by mouth 3 (three) times daily with meals. 90 tablet 3  . nitroGLYCERIN (NITRODUR - DOSED IN MG/24 HR) 0.4 mg/hr patch Place 1 patch (0.4 mg total) onto the skin daily. 30 patch 3  . oxyCODONE-acetaminophen (PERCOCET/ROXICET) 5-325 MG tablet Take 1 tablet by mouth every 6 (six) hours as needed for severe pain. 30 tablet 0  . pantoprazole (PROTONIX) 40 MG tablet Take 40 mg by mouth daily.    . pramipexole (MIRAPEX) 0.125 MG tablet Take 0.125 mg by mouth at bedtime.    . rosuvastatin (CRESTOR) 40 MG tablet Take 1 tablet (40 mg total) by mouth daily. 90 tablet 0  . silver sulfADIAZINE (SILVADENE) 1 % cream Apply 1 application topically daily. 50 g 0  . spironolactone (ALDACTONE) 25 MG tablet Take 1 tablet (25 mg total) by mouth daily. 30 tablet 3  . torsemide (DEMADEX) 20 MG tablet Take 1 tablet (20 mg total) by mouth 3 (three) times a week. Every Mon, Wed and Fri 30 tablet 3  . warfarin (COUMADIN) 5 MG tablet Take 1-1.5 tablets (5-7.5 mg total) by mouth daily. Take 5 mg (1 tab) daily except 7.5 mg (1 and 1/2 tabs) on Monday, Wednesday and Friday 34 tablet 0   No current facility-administered medications for this visit.     Allergies:   Morphine and related    Social History:  The patient  reports that he has quit smoking. His smoking use included Cigarettes. He smoked 0.50 packs per day. He has never used smokeless tobacco. He reports that he does not drink alcohol or use drugs.   Family History:  Not able to obtain due to distress.  ROS:  Please see the history of present illness.   Otherwise, review of systems are positive for none.   All other systems are reviewed and negative.     PHYSICAL EXAM: VS:  BP (!) 104/59 (BP Location: Left Arm, Patient Position: Sitting, Cuff Size: Normal)   Pulse 90   Ht 5\' 5"  (1.651 m)   Wt 144 lb (65.3 kg)   BMI 23.96 kg/m  , BMI Body mass index is 23.96 kg/m. GEN: Well nourished, well developed, in no acute distress  HEENT: normal  Neck: no JVD, carotid bruits, or masses Cardiac: RRR; no  rubs, or gallops,no edema . 3/6 holosystolic murmur at the apex radiating to the  left sternal border. Respiratory:  clear to auscultation bilaterally, normal work of breathing GI: soft, nontender, nondistended, + BS MS: no deformity or atrophy  Skin: warm and dry, no rash Neuro:  Strength and sensation are intact Psych: euthymic mood, full affect  Vascular: Distal pulses are not palpable. The right great toe amputation site still open but seems to be healing. The patient has a necrotic tissue in  the tip of the right second toe which seems to be on the verge of autoamputation.  EKG:  EKG is not ordered today.    Recent Labs: 03/22/2016: ALT 19 03/30/2016: Magnesium 2.2 05/02/2016: Hemoglobin 10.4; Platelets 235 05/28/2016: BUN 19; Creatinine, Ser 1.53; Potassium 4.4; Sodium 138    Lipid Panel    Component Value Date/Time   CHOL 96 03/13/2016 1057   TRIG 146 03/13/2016 1057   HDL 23 (L) 03/13/2016 1057   CHOLHDL 4.2 03/13/2016 1057   VLDL 29 03/13/2016 1057   LDLCALC 44 03/13/2016 1057      Wt Readings from Last 3 Encounters:  05/29/16 144 lb (65.3 kg)  05/28/16 144 lb 9.6 oz (65.6 kg)  05/10/16 144 lb (65.3 kg)      No flowsheet data found.    ASSESSMENT AND PLAN:  1.  Peripheral arterial disease with critical limb ischemia involving the right foot (Rutherford class 5):  He is status post recent drug-coated balloon angioplasty of the right SFA with good results. Post procedure ABI was normal. The patient obviously has significant below the knee disease with only 1 vessel runoff. The amputation site seems to be slowly  healing and the second toe tip will likely auto amputates. I'm going to see him in one month and if there is no full healing by then, I might consider retrograde recanalization of the anterior tibial artery via the dorsalis pedis.   2. Chronic systolic heart failure: He appears to be euvolemic.  He is doing significantly better and was recently started on small dose carvedilol.   3.Postoperative atrial fibrillation: Currently in sinus rhythm.He is on anticoagulation with warfarin.  4. Coronary artery disease involving native coronary arteries without angina:  status post one-vessel CABG and VSD repair. Continue medical therapy.   Disposition:   follow-up with me in one month  Signed,  [name], MD  05/29/2016 10:31 AM    [place]

## 2016-05-29 NOTE — Patient Instructions (Signed)
Medication Instructions:  Your physician recommends that you continue on your current medications as directed. Please refer to the Current Medication list given to you today.  Labwork: No new orders.   Testing/Procedures: No new orders.   Follow-Up: Your physician recommends that you schedule a follow-up appointment in: 1 MONTH with Dr Arida   Any Other Special Instructions Will Be Listed Below (If Applicable).     If you need a refill on your cardiac medications before your next appointment, please call your pharmacy.   

## 2016-05-30 ENCOUNTER — Encounter: Payer: Medicare Other | Admitting: Cardiothoracic Surgery

## 2016-06-01 ENCOUNTER — Ambulatory Visit (INDEPENDENT_AMBULATORY_CARE_PROVIDER_SITE_OTHER): Payer: Self-pay | Admitting: Cardiothoracic Surgery

## 2016-06-01 ENCOUNTER — Encounter: Payer: Self-pay | Admitting: Cardiothoracic Surgery

## 2016-06-01 VITALS — BP 88/57 | HR 85 | Resp 16 | Ht 65.0 in | Wt 144.0 lb

## 2016-06-01 DIAGNOSIS — Z8774 Personal history of (corrected) congenital malformations of heart and circulatory system: Secondary | ICD-10-CM

## 2016-06-01 DIAGNOSIS — Z951 Presence of aortocoronary bypass graft: Secondary | ICD-10-CM

## 2016-06-01 DIAGNOSIS — Z9889 Other specified postprocedural states: Secondary | ICD-10-CM

## 2016-06-01 NOTE — Progress Notes (Addendum)
PCP is Philis Fendt, MD Referring Provider is Wellington Hampshire, MD  Chief Complaint  Patient presents with  . Routine Post Op    6 wk f/u s/p CABG X 1 and CLOSURE of VSD on 03/13/16...saw E.BARRETT, P.A. on 04/16/16     HPI: Final postop visit 2 months after emergency CABG with repair of post MI VSD. Patient is a 65 year old diabetic smoker with severe peripheral rash or disease who presented with a acute D MI and gangrenous right foot. He underwent emergency CABG with Teflon felt patching of a large posterior VSD. His postop ejection fraction was 40%. He had RV infarction. He had postop hypotension and remains on oral midodrine. He is followed at the advanced heart failure clinic it was seen earlier this week. Because of better blood pressure is started on a low-dose beta blocker-carvedilol.  The patient was not discharged from the hospital on a beta blocker because he was hypotensive and needed midodrine to maintain adequate systolic blood pressure   He is in a sinus rhythm. He is on Coumadin for the Teflon felt VSD repair. He has had no bleeding complications. He is starting be L to walk on his foot which required a ray amputation of the great toe and second toe. He is followed by Dr. Sharol Given for his foot which is healing slowly but nicely. Last month he underwent a PTCA of a diseased right superficial femoral artery.  The patient denies any symptoms of angina or heart failure. He is not resumed smoking He remains weak but appetite and strength are improving He has no edema or decubitus ulcers.  Past Medical History:  Diagnosis Date  . Diabetes mellitus without complication ([place])   . Hyperlipidemia   . Hypertension     Past Surgical History:  Procedure Laterality Date  . AMPUTATION Right 03/23/2016   Procedure: 1st and 2nd Ray Amputation Right Foot;  Surgeon: Newt Minion, MD;  Location: [place];  Service: Orthopedics;  Laterality: Right;  . CARDIAC CATHETERIZATION N/A 03/13/2016    Procedure: Right/Left Heart Cath and Coronary Angiography;  Surgeon: Sherren Mocha, MD;  Location: [place] [facility];  Service: Cardiovascular;  Laterality: N/A;  . CARDIAC CATHETERIZATION N/A 03/13/2016   Procedure: IABP Insertion;  Surgeon: Sherren Mocha, MD;  Location: [place] [facility];  Service: Cardiovascular;  Laterality: N/A;  . CORONARY ARTERY BYPASS GRAFT N/A 03/13/2016   Procedure: CORONARY ARTERY BYPASS GRAFTING (CABG) x 1 (SVG to OM) with EVH from [place];  Surgeon: Ivin Poot, MD;  Location: [place];  Service: Open Heart Surgery;  Laterality: N/A;  . LOWER EXTREMITY ANGIOGRAM  05/02/2016   Procedure: Lower Extremity Angiogram;  Surgeon: Wellington Hampshire, MD;  Location: [place] [facility];  Service: Cardiovascular;;  Limited left femoral runoff right femoral runoff  . PERIPHERAL VASCULAR CATHETERIZATION N/A 05/02/2016   Procedure: Abdominal Aortogram;  Surgeon: Wellington Hampshire, MD;  Location: [place] [facility];  Service: Cardiovascular;  Laterality: N/A;  . PERIPHERAL VASCULAR CATHETERIZATION Right 05/02/2016   Procedure: Peripheral Vascular Balloon Angioplasty;  Surgeon: Wellington Hampshire, MD;  Location: [place] [facility];  Service: Cardiovascular;  Laterality: Right;  SFA  . TEE WITHOUT CARDIOVERSION N/A 03/13/2016   Procedure: TRANSESOPHAGEAL ECHOCARDIOGRAM (TEE);  Surgeon: Ivin Poot, MD;  Location: [place];  Service: Open Heart Surgery;  Laterality: N/A;  . VSD REPAIR N/A 03/13/2016   Procedure: VENTRICULAR SEPTAL DEFECT (VSD) REPAIR;  Surgeon: Ivin Poot, MD;  Location: [facility];  Service: Open Heart Surgery;  Laterality: N/A;    No family history on file.  Social History Social History  Substance Use Topics  . Smoking status: Former Smoker    Packs/day: 0.50    Types: Cigarettes  . Smokeless tobacco: Never Used  . Alcohol use No    Current Outpatient Prescriptions  Medication Sig Dispense Refill  . aspirin EC 81 MG tablet Take 81 mg by  mouth daily.    . carvedilol (COREG) 3.125 MG tablet Take 1 tablet (3.125 mg total) by mouth 2 (two) times daily. 180 tablet 3  . digoxin (LANOXIN) 0.125 MG tablet Take 1 tablet (0.125 mg total) by mouth daily. 30 tablet 3  . docusate sodium (COLACE) 100 MG capsule Take 100 mg by mouth 2 (two) times daily.    [name] gabapentin (NEURONTIN) 300 MG capsule Take 300 mg by mouth daily.     . insulin detemir (LEVEMIR) 100 UNIT/ML injection Inject 40-50 Units into the skin 2 (two) times daily. Take 50 every morning  Take 40 units every evening    . midodrine (PROAMATINE) 5 MG tablet Take 1 tablet (5 mg total) by mouth 3 (three) times daily with meals. 90 tablet 3  . nitroGLYCERIN (NITRODUR - DOSED IN MG/24 HR) 0.4 mg/hr patch Place 1 patch (0.4 mg total) onto the skin daily. 30 patch 3  . oxyCODONE-acetaminophen (PERCOCET/ROXICET) 5-325 MG tablet Take 1 tablet by mouth every 6 (six) hours as needed for severe pain. 30 tablet 0  . pantoprazole (PROTONIX) 40 MG tablet Take 40 mg by mouth daily.    . pramipexole (MIRAPEX) 0.125 MG tablet Take 0.125 mg by mouth at bedtime.    . rosuvastatin (CRESTOR) 40 MG tablet Take 1 tablet (40 mg total) by mouth daily. 90 tablet 0  . silver sulfADIAZINE (SILVADENE) 1 % cream Apply 1 application topically daily. 50 g 0  . spironolactone (ALDACTONE) 25 MG tablet Take 1 tablet (25 mg total) by mouth daily. 30 tablet 3  . torsemide (DEMADEX) 20 MG tablet Take 1 tablet (20 mg total) by mouth 3 (three) times a week. Every Mon, Wed and Fri 30 tablet 3  . warfarin (COUMADIN) 5 MG tablet Take 1-1.5 tablets (5-7.5 mg total) by mouth daily. Take 5 mg (1 tab) daily except 7.5 mg (1 and 1/2 tabs) on Monday, Wednesday and Friday 34 tablet 0   No current facility-administered medications for this visit.     Allergies  Allergen Reactions  . Morphine And Related Other (See Comments)    UNSPECIFIED REACTION "Pt said it was too much"     Review of Systems  Afebrile Gen.  improved  BP (!) 88/57 (BP Location: Right Arm, Patient Position: Sitting, Cuff Size: Normal)   Pulse 85   Resp 16   Ht 5\' 5"  (1.651 m)   Wt 144 lb (65.3 kg)   SpO2 94% Comment: ON RA  BMI 23.96 kg/m  Physical Exam Chronically ill male Alert and appropriate Lungs clear Heart rate regular without murmur Abdomen nontender nondistended Lower extremities without edema Right foot necrotic tissue debrided and clean dressing placed over right foot and office  Diagnostic Tests: None  Impression: Doing well 8 weeks after surgery Patient will continue to be followed by the advanced heart failure cardiology clinic and by orthopedics Dr. Sharol Given  Plan: Continue current medications Start cardiac rehabilitation after the first year when he is able to walk better Return here as needed   Tharon Aquas  Adelene Idler, MD [facility] [phone]

## 2016-06-04 ENCOUNTER — Telehealth (INDEPENDENT_AMBULATORY_CARE_PROVIDER_SITE_OTHER): Payer: Self-pay | Admitting: *Deleted

## 2016-06-04 NOTE — Telephone Encounter (Signed)
Seth Bake called from [place] for last OV for the past 30 days for pts wound supplies.

## 2016-06-05 ENCOUNTER — Ambulatory Visit (INDEPENDENT_AMBULATORY_CARE_PROVIDER_SITE_OTHER): Payer: Medicare Other | Admitting: Orthopedic Surgery

## 2016-06-05 DIAGNOSIS — I70261 Atherosclerosis of native arteries of extremities with gangrene, right leg: Secondary | ICD-10-CM | POA: Insufficient documentation

## 2016-06-05 DIAGNOSIS — E1142 Type 2 diabetes mellitus with diabetic polyneuropathy: Secondary | ICD-10-CM

## 2016-06-05 NOTE — Progress Notes (Signed)
Office Visit Note   Patient: William [name]           Date of Birth: 1950-09-14           MRN: [id number] Visit Date: 06/05/2016              Requested by: Nolene Ebbs, MD [address] [place], [place] [address] PCP: Philis Fendt, MD   Assessment & Plan: Visit Diagnoses:  1. Atherosclerosis of native artery of right lower extremity with gangrene ([place])   2. Diabetic polyneuropathy associated with type 2 diabetes mellitus ([place])     Plan: Patient is showing excellent improvement with his wound care. The 2 wounds have beefy granulation tissue we will have him continue with Silvadene dressing changes to the toe ulcer as well as the 2 medial column ulcers. Continue the nitroglycerin patch daily Dial soap cleansing daily continue nonweightbearing on the right lower extremity follow-up in the office in 4 weeks.  Follow-Up Instructions: Return in about 4 weeks (around 07/03/2016).   Orders:  No orders of the defined types were placed in this encounter.  No orders of the defined types were placed in this encounter.     Procedures: No procedures performed   Clinical Data: No additional findings.   Subjective: Chief Complaint  Patient presents with  . Right Foot - Routine Post Op    Right foot 1st and 2nd ray amputation.    Pt is non weight bearing in wheelchair and darco shoe. States that he is treating with Silvadene and nitro patch daily. The surgical site is open with discoloration to the skin. The tip of the third toe is black and the 4th toe is red also. The drainage is brown and the foot is slightly swollen. He does not have any complaints today.    Review of Systems   Objective: Vital Signs: There were no vitals taken for this visit.  Physical Exam examination patient's wounds are much improved. There is good beefy granulation tissue after debridement approximate 75% these do not probe to bone there is no ascending cellulitis. The eschar off the tip of the third  toe is resolving nicely there is no sausage digit swelling. There is granulation around the edges of the eschar.  Ortho Exam  Specialty Comments:  No specialty comments available.  Imaging: No results found.   PMFS History: Patient Active Problem List   Diagnosis Date Noted  . Atherosclerosis of native artery of right lower extremity with gangrene ([place]) 06/05/2016  . Diabetic polyneuropathy associated with type 2 diabetes mellitus ([place]) 06/05/2016  . Chronic systolic HF (heart failure) ([place]) 05/28/2016  . Encounter for therapeutic drug monitoring 04/05/2016  . Hypoalbuminemia 03/21/2016  . CAD (coronary artery disease), native coronary artery 03/16/2016  . VSD (ventricular septal defect) 03/16/2016  . Hx of CABG   . Paroxysmal atrial fibrillation ([place]) 03/15/2016  . MI (myocardial infarction) 03/13/2016  . Erectile dysfunction associated with type 2 diabetes mellitus ([place]) 04/07/2012  . Counseling and coordination of care 01/05/2012  . Mood disorder ([place]) 03/12/2011  . Essential hypertension 03/17/2010  . ONYCHOMYCOSIS, TOENAILS 01/03/2010  . NICOTINE ADDICTION 12/28/2008  . Hyperlipidemia 11/01/2008  . GERD 11/01/2008  . SHOULDER PAIN, LEFT, CHRONIC 11/01/2008  . NECK PAIN, CHRONIC 11/01/2008  . DM (diabetes mellitus), type 2, uncontrolled ([place]) 09/23/2008   Past Medical History:  Diagnosis Date  . Diabetes mellitus without complication ([place])   . Hyperlipidemia   . Hypertension     No family history  on file.  Past Surgical History:  Procedure Laterality Date  . AMPUTATION Right 03/23/2016   Procedure: 1st and 2nd Ray Amputation Right Foot;  Surgeon: Newt Minion, MD;  Location: [facility];  Service: Orthopedics;  Laterality: Right;  . CARDIAC CATHETERIZATION N/A 03/13/2016   Procedure: Right/Left Heart Cath and Coronary Angiography;  Surgeon: Sherren Mocha, MD;  Location: [facility];  Service: Cardiovascular;  Laterality: N/A;  . CARDIAC CATHETERIZATION N/A  03/13/2016   Procedure: IABP Insertion;  Surgeon: Sherren Mocha, MD;  Location: [facility];  Service: Cardiovascular;  Laterality: N/A;  . CORONARY ARTERY BYPASS GRAFT N/A 03/13/2016   Procedure: CORONARY ARTERY BYPASS GRAFTING (CABG) x 1 (SVG to OM) with EVH from [place];  Surgeon: Ivin Poot, MD;  Location: [facility];  Service: Open Heart Surgery;  Laterality: N/A;  . LOWER EXTREMITY ANGIOGRAM  05/02/2016   Procedure: Lower Extremity Angiogram;  Surgeon: Wellington Hampshire, MD;  Location: [facility];  Service: Cardiovascular;;  Limited left femoral runoff right femoral runoff  . PERIPHERAL VASCULAR CATHETERIZATION N/A 05/02/2016   Procedure: Abdominal Aortogram;  Surgeon: Wellington Hampshire, MD;  Location: [facility];  Service: Cardiovascular;  Laterality: N/A;  . PERIPHERAL VASCULAR CATHETERIZATION Right 05/02/2016   Procedure: Peripheral Vascular Balloon Angioplasty;  Surgeon: Wellington Hampshire, MD;  Location: [facility];  Service: Cardiovascular;  Laterality: Right;  SFA  . TEE WITHOUT CARDIOVERSION N/A 03/13/2016   Procedure: TRANSESOPHAGEAL ECHOCARDIOGRAM (TEE);  Surgeon: Ivin Poot, MD;  Location: [facility];  Service: Open Heart Surgery;  Laterality: N/A;  . VSD REPAIR N/A 03/13/2016   Procedure: VENTRICULAR SEPTAL DEFECT (VSD) REPAIR;  Surgeon: Ivin Poot, MD;  Location: [facility];  Service: Open Heart Surgery;  Laterality: N/A;   Social History   Occupational History  . Not on file.   Social History Main Topics  . Smoking status: Former Smoker    Packs/day: 0.50    Types: Cigarettes  . Smokeless tobacco: Never Used  . Alcohol use No  . Drug use: No  . Sexual activity: Not on file

## 2016-06-06 NOTE — Telephone Encounter (Signed)
Tried calling to ask for fax number. Their office is closed today. Will call again tomorrow.

## 2016-06-07 NOTE — Telephone Encounter (Signed)
Notes faxed to [phone]. ATTN: BETH

## 2016-06-11 ENCOUNTER — Ambulatory Visit (HOSPITAL_COMMUNITY)
Admission: RE | Admit: 2016-06-11 | Discharge: 2016-06-11 | Disposition: A | Discharge status: Home / Self Care | Payer: Medicare Other | Source: Ambulatory Visit | Attending: Cardiology | Admitting: Cardiology

## 2016-06-11 ENCOUNTER — Ambulatory Visit: Payer: Medicare Other

## 2016-06-11 ENCOUNTER — Telehealth (INDEPENDENT_AMBULATORY_CARE_PROVIDER_SITE_OTHER): Payer: Self-pay | Admitting: *Deleted

## 2016-06-11 ENCOUNTER — Encounter (HOSPITAL_COMMUNITY): Payer: Self-pay

## 2016-06-11 VITALS — BP 112/62 | HR 72 | Wt 146.0 lb

## 2016-06-11 DIAGNOSIS — E785 Hyperlipidemia, unspecified: Secondary | ICD-10-CM | POA: Diagnosis not present

## 2016-06-11 DIAGNOSIS — I480 Paroxysmal atrial fibrillation: Secondary | ICD-10-CM | POA: Insufficient documentation

## 2016-06-11 DIAGNOSIS — K5900 Constipation, unspecified: Secondary | ICD-10-CM | POA: Insufficient documentation

## 2016-06-11 DIAGNOSIS — E1165 Type 2 diabetes mellitus with hyperglycemia: Secondary | ICD-10-CM | POA: Insufficient documentation

## 2016-06-11 DIAGNOSIS — I2510 Atherosclerotic heart disease of native coronary artery without angina pectoris: Secondary | ICD-10-CM | POA: Diagnosis not present

## 2016-06-11 DIAGNOSIS — I951 Orthostatic hypotension: Secondary | ICD-10-CM | POA: Diagnosis not present

## 2016-06-11 DIAGNOSIS — Z951 Presence of aortocoronary bypass graft: Secondary | ICD-10-CM | POA: Insufficient documentation

## 2016-06-11 DIAGNOSIS — Z89411 Acquired absence of right great toe: Secondary | ICD-10-CM | POA: Diagnosis not present

## 2016-06-11 DIAGNOSIS — I5023 Acute on chronic systolic (congestive) heart failure: Secondary | ICD-10-CM | POA: Diagnosis not present

## 2016-06-11 DIAGNOSIS — I5022 Chronic systolic (congestive) heart failure: Secondary | ICD-10-CM

## 2016-06-11 DIAGNOSIS — Z87891 Personal history of nicotine dependence: Secondary | ICD-10-CM | POA: Insufficient documentation

## 2016-06-11 LAB — CBC
HEMATOCRIT: 34.1 % — AB (ref 39.0–52.0)
Hemoglobin: 11.2 g/dL — ABNORMAL LOW (ref 13.0–17.0)
MCH: 26 pg (ref 26.0–34.0)
MCHC: 32.8 g/dL (ref 30.0–36.0)
MCV: 79.1 fL (ref 78.0–100.0)
PLATELETS: 237 10*3/uL (ref 150–400)
RBC: 4.31 MIL/uL (ref 4.22–5.81)
RDW: 14.8 % (ref 11.5–15.5)
WBC: 7.9 10*3/uL (ref 4.0–10.5)

## 2016-06-11 LAB — BASIC METABOLIC PANEL
Anion gap: 8 (ref 5–15)
BUN: 22 mg/dL — AB (ref 6–20)
CALCIUM: 9.2 mg/dL (ref 8.9–10.3)
CO2: 24 mmol/L (ref 22–32)
CREATININE: 1.68 mg/dL — AB (ref 0.61–1.24)
Chloride: 101 mmol/L (ref 101–111)
GFR calc Af Amer: 48 mL/min — ABNORMAL LOW (ref >60)
GFR, EST NON AFRICAN AMERICAN: 41 mL/min — AB (ref >60)
GLUCOSE: 421 mg/dL — AB (ref 65–99)
Potassium: 4.2 mmol/L (ref 3.5–5.1)
SODIUM: 133 mmol/L — AB (ref 135–145)

## 2016-06-11 NOTE — Telephone Encounter (Signed)
[facility] needs verbal for roll gauze and 4x4s tape and dimentions of wound that are current.

## 2016-06-11 NOTE — Patient Instructions (Signed)
It was great to see you today!  Please continue taking your medications as prescribed.   You may start taking docusate 100 mg TWICE DAILY to help with bowel movements. You may also try Senna laxative if the docusate does not work alone.   Lab work today. We will call you with any abnormalities.   Dr. Haroldine Laws recommends a follow up with you in 1 month.

## 2016-06-11 NOTE — Telephone Encounter (Signed)
Advised William [name] from [facility] what we faxed her is all we have. We just have physical description of the wound, no current wound measurements. And that most patients typically provide their own whether it be a feminine pad and reusable ace bandage.

## 2016-06-11 NOTE — Progress Notes (Signed)
HF MD: William [name]  HPI:  William [name] is a 65 year old with a history of CAD with inferior MI complicated by acute VSD and cardiogenic shock, S/P CABG x1 with VSD repair on 03/13/16, S/P R 1st and 2nd toe amputation 03/24/2016, DMII, PAF, and hyperlipidemia.    Admitted September 2017 with chest pain. Inferior MI c/b acute VSD and cardiogenic shock required CABG x 1 with VSD repair on 03/13/2016. Post op course prolonged due to AF and cardiogenic shock due to RV failure  Slow wean off milrinone due low mixed venous saturation. Also had amputation of R 1st and 2nd toe for osteo.  Discharge weight was 150 pounds. He was not discharged on bb or ace with hypotension.   He presents today for pharmacist-led HF medication titration. At last HF visit on 11/27 patient reported that he has been walking more, although at today's visit he stated that he has been sitting a lot more due to toe amputation and recent drainage around surgical site by Dr. Sharol Given on 12/5. He was also started on carvedilol 3.125 mg BID at that visit. His main complaint was constipation and high blood sugar (300-400s). He has stopped smoking completely and is still very limited by back pain. Patient reports that he only gets dizzy or SOB when he is doing the dishes and standing in the same place for long periods of time.     . Shortness of breath/dyspnea on exertion? no  . Orthopnea/PND? no . Edema? no . Lightheadedness/dizziness? no . Daily weights at home? Yes stable ~144 lb . Blood pressure/heart rate monitoring at home? Yes - when feeling light headed at home . Following low-sodium/fluid-restricted diet? no  HF Medications: Carvedilol 3.125 mg PO BID Digoxin 0.125 mg PO daily Spironolactone 25 mg PO daily Torsemide 20 mg PO M/W/F  Has the patient been experiencing any side effects to the medications prescribed?  no  Does the patient have any problems obtaining medications due to transportation or finances?   no  Understanding of  regimen: good Understanding of indications: good Potential of compliance: good Patient understands to avoid NSAIDs. Patient understands to avoid decongestants.   Pertinent Lab Values: . 12/11 BMET: Serum creatinine 1.68 (BL ~1.2), BUN 22, Potassium 4.2, Sodium 133 . 12/11 CBC: WBC 7.9, hgb 11.2, PLT 237 . 03/23/16: Dig 0.5   Vital Signs: . Weight: 146 (dry weight: 144 lb) . Blood pressure: 112/62 mmHg . Heart rate: 72 bpm   Assessment: 1. Chronic Systolic Heart Failure (EF 40-45% on 9/17 echo). NYHA Class II-III. - Volume status stable. HR has improved - Continue spiro 25 mg daily, digoxin 0.125 mg daily, coreg 3.125 mg BID, torsemide 20 mg M/W/F - No titration at this point with continued weakness - Continue midodrine for now - Basic disease state pathophysiology, medication indication, mechanism and side effects reviewed at length with patient and he verbalized understanding 2. Inerior STEMI CAD-->S/P CABG x1 VSD Repair with RV failure - Continue statin and aspirin 3. PAD Ischemic R Great Toe s/p amputation 9/22 - Wound care per [name].  - s/p LE angio with Dr Fletcher Anon as above 4. Hyperlipidemia - Statin 5. DM Type II - Uncontrolled with BG at home in 400-500s - Unhappy with current PCP and looking for new one  - Recommended seeing current PCP or his NP in the meantime for appropriate insulin adjustments 6. PAF - Coumadin. [facility].  7. RV failure 8. Orthostatic hypotension - Midodrine 5mg  TID 9.  Constipation - Finishing up last week of opioids prescribed for toe amputation - Recommended docusate 100mg  BID prn to be obtained OTC   Plan: 1) Medication changes: Based on clinical presentation, vital signs and recent labs will continue current medications at prescribed doses 2) Labs: BMET today  3) Follow-up: Dr. Haroldine Laws in 1 month   William [name], PharmD, [place] PGY2 Pharmacy Resident  William [name], PharmD, BCPS, CPP Clinical  Pharmacist Pager: [phone] Phone: [phone] 06/11/2016 3:15 PM   Agree with above.   William Jared,MD 5:54 PM

## 2016-06-12 ENCOUNTER — Ambulatory Visit (INDEPENDENT_AMBULATORY_CARE_PROVIDER_SITE_OTHER): Payer: Medicare Other | Admitting: Pharmacist Clinician (PhC)/ Clinical Pharmacy Specialist

## 2016-06-12 DIAGNOSIS — I70261 Atherosclerosis of native arteries of extremities with gangrene, right leg: Secondary | ICD-10-CM

## 2016-06-12 DIAGNOSIS — I480 Paroxysmal atrial fibrillation: Secondary | ICD-10-CM

## 2016-06-12 DIAGNOSIS — Z5181 Encounter for therapeutic drug level monitoring: Secondary | ICD-10-CM | POA: Diagnosis not present

## 2016-06-12 LAB — POCT INR: INR: 2.3

## 2016-06-13 ENCOUNTER — Other Ambulatory Visit (INDEPENDENT_AMBULATORY_CARE_PROVIDER_SITE_OTHER): Payer: Self-pay | Admitting: Family

## 2016-06-13 MED ORDER — SILVER SULFADIAZINE 1 % EX CREA: 1.0000 "application " | TOPICAL_CREAM | Freq: Every day | CUTANEOUS | 1 refills | Status: DC

## 2016-06-20 ENCOUNTER — Emergency Department (HOSPITAL_COMMUNITY): Payer: Medicare Other

## 2016-06-20 ENCOUNTER — Encounter (HOSPITAL_COMMUNITY): Payer: Self-pay | Admitting: *Deleted

## 2016-06-20 DIAGNOSIS — A419 Sepsis, unspecified organism: Principal | ICD-10-CM | POA: Diagnosis present

## 2016-06-20 DIAGNOSIS — E11621 Type 2 diabetes mellitus with foot ulcer: Secondary | ICD-10-CM | POA: Diagnosis present

## 2016-06-20 DIAGNOSIS — Z87891 Personal history of nicotine dependence: Secondary | ICD-10-CM

## 2016-06-20 DIAGNOSIS — I5022 Chronic systolic (congestive) heart failure: Secondary | ICD-10-CM | POA: Diagnosis present

## 2016-06-20 DIAGNOSIS — Z79899 Other long term (current) drug therapy: Secondary | ICD-10-CM

## 2016-06-20 DIAGNOSIS — Z885 Allergy status to narcotic agent status: Secondary | ICD-10-CM

## 2016-06-20 DIAGNOSIS — N179 Acute kidney failure, unspecified: Secondary | ICD-10-CM | POA: Diagnosis present

## 2016-06-20 DIAGNOSIS — Z7901 Long term (current) use of anticoagulants: Secondary | ICD-10-CM

## 2016-06-20 DIAGNOSIS — R6520 Severe sepsis without septic shock: Secondary | ICD-10-CM | POA: Diagnosis present

## 2016-06-20 DIAGNOSIS — I959 Hypotension, unspecified: Secondary | ICD-10-CM | POA: Diagnosis present

## 2016-06-20 DIAGNOSIS — E1152 Type 2 diabetes mellitus with diabetic peripheral angiopathy with gangrene: Secondary | ICD-10-CM | POA: Diagnosis not present

## 2016-06-20 DIAGNOSIS — L089 Local infection of the skin and subcutaneous tissue, unspecified: Secondary | ICD-10-CM | POA: Diagnosis not present

## 2016-06-20 DIAGNOSIS — E785 Hyperlipidemia, unspecified: Secondary | ICD-10-CM | POA: Diagnosis present

## 2016-06-20 DIAGNOSIS — L03115 Cellulitis of right lower limb: Secondary | ICD-10-CM | POA: Diagnosis present

## 2016-06-20 DIAGNOSIS — N183 Chronic kidney disease, stage 3 (moderate): Secondary | ICD-10-CM | POA: Diagnosis present

## 2016-06-20 DIAGNOSIS — Z8249 Family history of ischemic heart disease and other diseases of the circulatory system: Secondary | ICD-10-CM

## 2016-06-20 DIAGNOSIS — Z951 Presence of aortocoronary bypass graft: Secondary | ICD-10-CM

## 2016-06-20 DIAGNOSIS — E871 Hypo-osmolality and hyponatremia: Secondary | ICD-10-CM | POA: Diagnosis not present

## 2016-06-20 DIAGNOSIS — Z794 Long term (current) use of insulin: Secondary | ICD-10-CM

## 2016-06-20 DIAGNOSIS — I480 Paroxysmal atrial fibrillation: Secondary | ICD-10-CM | POA: Diagnosis present

## 2016-06-20 DIAGNOSIS — E1169 Type 2 diabetes mellitus with other specified complication: Secondary | ICD-10-CM | POA: Diagnosis present

## 2016-06-20 DIAGNOSIS — E11628 Type 2 diabetes mellitus with other skin complications: Secondary | ICD-10-CM | POA: Diagnosis present

## 2016-06-20 DIAGNOSIS — E1142 Type 2 diabetes mellitus with diabetic polyneuropathy: Secondary | ICD-10-CM | POA: Diagnosis present

## 2016-06-20 DIAGNOSIS — I130 Hypertensive heart and chronic kidney disease with heart failure and stage 1 through stage 4 chronic kidney disease, or unspecified chronic kidney disease: Secondary | ICD-10-CM | POA: Diagnosis present

## 2016-06-20 DIAGNOSIS — M79671 Pain in right foot: Secondary | ICD-10-CM | POA: Diagnosis not present

## 2016-06-20 DIAGNOSIS — I255 Ischemic cardiomyopathy: Secondary | ICD-10-CM | POA: Diagnosis present

## 2016-06-20 DIAGNOSIS — D649 Anemia, unspecified: Secondary | ICD-10-CM | POA: Diagnosis present

## 2016-06-20 DIAGNOSIS — I2583 Coronary atherosclerosis due to lipid rich plaque: Secondary | ICD-10-CM | POA: Diagnosis present

## 2016-06-20 DIAGNOSIS — M79674 Pain in right toe(s): Secondary | ICD-10-CM | POA: Diagnosis not present

## 2016-06-20 DIAGNOSIS — I2510 Atherosclerotic heart disease of native coronary artery without angina pectoris: Secondary | ICD-10-CM | POA: Diagnosis present

## 2016-06-20 DIAGNOSIS — L97519 Non-pressure chronic ulcer of other part of right foot with unspecified severity: Secondary | ICD-10-CM | POA: Diagnosis present

## 2016-06-20 DIAGNOSIS — Z993 Dependence on wheelchair: Secondary | ICD-10-CM

## 2016-06-20 DIAGNOSIS — Z833 Family history of diabetes mellitus: Secondary | ICD-10-CM

## 2016-06-20 DIAGNOSIS — E1122 Type 2 diabetes mellitus with diabetic chronic kidney disease: Secondary | ICD-10-CM | POA: Diagnosis present

## 2016-06-20 DIAGNOSIS — E1165 Type 2 diabetes mellitus with hyperglycemia: Secondary | ICD-10-CM | POA: Diagnosis present

## 2016-06-20 DIAGNOSIS — M869 Osteomyelitis, unspecified: Secondary | ICD-10-CM | POA: Diagnosis present

## 2016-06-20 DIAGNOSIS — Z7982 Long term (current) use of aspirin: Secondary | ICD-10-CM

## 2016-06-20 DIAGNOSIS — I252 Old myocardial infarction: Secondary | ICD-10-CM

## 2016-06-20 DIAGNOSIS — M8600 Acute hematogenous osteomyelitis, unspecified site: Secondary | ICD-10-CM | POA: Diagnosis not present

## 2016-06-20 LAB — CBC WITH DIFFERENTIAL/PLATELET
Basophils Absolute: 0 10*3/uL (ref 0.0–0.1)
Basophils Relative: 0 %
EOS PCT: 0 %
Eosinophils Absolute: 0.1 10*3/uL (ref 0.0–0.7)
HCT: 31.7 % — ABNORMAL LOW (ref 39.0–52.0)
Hemoglobin: 10.6 g/dL — ABNORMAL LOW (ref 13.0–17.0)
LYMPHS ABS: 2.3 10*3/uL (ref 0.7–4.0)
LYMPHS PCT: 14 %
MCH: 25.9 pg — AB (ref 26.0–34.0)
MCHC: 33.4 g/dL (ref 30.0–36.0)
MCV: 77.5 fL — AB (ref 78.0–100.0)
MONO ABS: 1.2 10*3/uL — AB (ref 0.1–1.0)
MONOS PCT: 8 %
Neutro Abs: 12.2 10*3/uL — ABNORMAL HIGH (ref 1.7–7.7)
Neutrophils Relative %: 78 %
PLATELETS: 294 10*3/uL (ref 150–400)
RBC: 4.09 MIL/uL — ABNORMAL LOW (ref 4.22–5.81)
RDW: 15.2 % (ref 11.5–15.5)
WBC: 15.8 10*3/uL — ABNORMAL HIGH (ref 4.0–10.5)

## 2016-06-20 NOTE — ED Triage Notes (Signed)
Worsening right great toe condition.  Pt has lost 2 toes (great toe and the one next to it) due to diabetes.  Now the middle toe is turning black.  Pt reports pain and fever with this.  Pt appears pale.

## 2016-06-21 ENCOUNTER — Inpatient Hospital Stay (HOSPITAL_COMMUNITY)
Admission: EM | Admit: 2016-06-21 | Discharge: 2016-06-23 | DRG: 854 | Disposition: A | Discharge status: Home Health | Payer: Medicare Other | Attending: Internal Medicine | Admitting: Internal Medicine

## 2016-06-21 ENCOUNTER — Inpatient Hospital Stay (HOSPITAL_COMMUNITY): Payer: Medicare Other | Admitting: Anesthesiology

## 2016-06-21 ENCOUNTER — Ambulatory Visit (INDEPENDENT_AMBULATORY_CARE_PROVIDER_SITE_OTHER): Payer: Medicare Other | Admitting: Orthopedic Surgery

## 2016-06-21 ENCOUNTER — Encounter (HOSPITAL_COMMUNITY)
Admission: EM | Disposition: A | Discharge status: Home Health | Payer: Self-pay | Source: Home / Self Care | Attending: Internal Medicine

## 2016-06-21 ENCOUNTER — Encounter (HOSPITAL_COMMUNITY): Payer: Self-pay | Admitting: Family Medicine

## 2016-06-21 DIAGNOSIS — M86071 Acute hematogenous osteomyelitis, right ankle and foot: Secondary | ICD-10-CM

## 2016-06-21 DIAGNOSIS — I252 Old myocardial infarction: Secondary | ICD-10-CM | POA: Diagnosis not present

## 2016-06-21 DIAGNOSIS — M79671 Pain in right foot: Secondary | ICD-10-CM | POA: Diagnosis not present

## 2016-06-21 DIAGNOSIS — Z885 Allergy status to narcotic agent status: Secondary | ICD-10-CM | POA: Diagnosis not present

## 2016-06-21 DIAGNOSIS — I5022 Chronic systolic (congestive) heart failure: Secondary | ICD-10-CM | POA: Diagnosis not present

## 2016-06-21 DIAGNOSIS — I96 Gangrene, not elsewhere classified: Secondary | ICD-10-CM

## 2016-06-21 DIAGNOSIS — E1152 Type 2 diabetes mellitus with diabetic peripheral angiopathy with gangrene: Secondary | ICD-10-CM

## 2016-06-21 DIAGNOSIS — I130 Hypertensive heart and chronic kidney disease with heart failure and stage 1 through stage 4 chronic kidney disease, or unspecified chronic kidney disease: Secondary | ICD-10-CM | POA: Diagnosis present

## 2016-06-21 DIAGNOSIS — D649 Anemia, unspecified: Secondary | ICD-10-CM | POA: Diagnosis present

## 2016-06-21 DIAGNOSIS — M869 Osteomyelitis, unspecified: Secondary | ICD-10-CM | POA: Diagnosis present

## 2016-06-21 DIAGNOSIS — N1832 Chronic kidney disease, stage 3b: Secondary | ICD-10-CM

## 2016-06-21 DIAGNOSIS — A419 Sepsis, unspecified organism: Secondary | ICD-10-CM | POA: Diagnosis not present

## 2016-06-21 DIAGNOSIS — E11621 Type 2 diabetes mellitus with foot ulcer: Secondary | ICD-10-CM | POA: Diagnosis present

## 2016-06-21 DIAGNOSIS — I2583 Coronary atherosclerosis due to lipid rich plaque: Secondary | ICD-10-CM

## 2016-06-21 DIAGNOSIS — L03115 Cellulitis of right lower limb: Secondary | ICD-10-CM | POA: Diagnosis present

## 2016-06-21 DIAGNOSIS — I480 Paroxysmal atrial fibrillation: Secondary | ICD-10-CM | POA: Diagnosis present

## 2016-06-21 DIAGNOSIS — M86171 Other acute osteomyelitis, right ankle and foot: Secondary | ICD-10-CM

## 2016-06-21 DIAGNOSIS — Z87891 Personal history of nicotine dependence: Secondary | ICD-10-CM | POA: Diagnosis not present

## 2016-06-21 DIAGNOSIS — N179 Acute kidney failure, unspecified: Secondary | ICD-10-CM | POA: Diagnosis not present

## 2016-06-21 DIAGNOSIS — Z8249 Family history of ischemic heart disease and other diseases of the circulatory system: Secondary | ICD-10-CM | POA: Diagnosis not present

## 2016-06-21 DIAGNOSIS — N183 Chronic kidney disease, stage 3 unspecified: Secondary | ICD-10-CM

## 2016-06-21 DIAGNOSIS — I10 Essential (primary) hypertension: Secondary | ICD-10-CM | POA: Diagnosis present

## 2016-06-21 DIAGNOSIS — I2510 Atherosclerotic heart disease of native coronary artery without angina pectoris: Secondary | ICD-10-CM | POA: Diagnosis present

## 2016-06-21 DIAGNOSIS — E871 Hypo-osmolality and hyponatremia: Secondary | ICD-10-CM | POA: Diagnosis present

## 2016-06-21 DIAGNOSIS — L97519 Non-pressure chronic ulcer of other part of right foot with unspecified severity: Secondary | ICD-10-CM | POA: Diagnosis present

## 2016-06-21 DIAGNOSIS — I110 Hypertensive heart disease with heart failure: Secondary | ICD-10-CM | POA: Diagnosis not present

## 2016-06-21 DIAGNOSIS — Z794 Long term (current) use of insulin: Secondary | ICD-10-CM

## 2016-06-21 DIAGNOSIS — I509 Heart failure, unspecified: Secondary | ICD-10-CM | POA: Diagnosis not present

## 2016-06-21 DIAGNOSIS — E1122 Type 2 diabetes mellitus with diabetic chronic kidney disease: Secondary | ICD-10-CM | POA: Diagnosis present

## 2016-06-21 DIAGNOSIS — E1165 Type 2 diabetes mellitus with hyperglycemia: Secondary | ICD-10-CM | POA: Diagnosis present

## 2016-06-21 DIAGNOSIS — Z993 Dependence on wheelchair: Secondary | ICD-10-CM | POA: Diagnosis not present

## 2016-06-21 DIAGNOSIS — E119 Type 2 diabetes mellitus without complications: Secondary | ICD-10-CM | POA: Diagnosis not present

## 2016-06-21 DIAGNOSIS — E785 Hyperlipidemia, unspecified: Secondary | ICD-10-CM | POA: Diagnosis present

## 2016-06-21 DIAGNOSIS — Z833 Family history of diabetes mellitus: Secondary | ICD-10-CM | POA: Diagnosis not present

## 2016-06-21 DIAGNOSIS — Z951 Presence of aortocoronary bypass graft: Secondary | ICD-10-CM | POA: Diagnosis not present

## 2016-06-21 HISTORY — PX: AMPUTATION: SHX166

## 2016-06-21 HISTORY — DX: Chronic systolic (congestive) heart failure: I50.22

## 2016-06-21 HISTORY — DX: ST elevation (STEMI) myocardial infarction of unspecified site: I21.3

## 2016-06-21 HISTORY — DX: Paroxysmal atrial fibrillation: I48.0

## 2016-06-21 HISTORY — DX: Acute kidney failure, unspecified: N17.9

## 2016-06-21 LAB — GLUCOSE, CAPILLARY
GLUCOSE-CAPILLARY: 53 mg/dL — AB (ref 65–99)
GLUCOSE-CAPILLARY: 185 mg/dL — AB (ref 65–99)
GLUCOSE-CAPILLARY: 135 mg/dL — AB (ref 65–99)
Glucose-Capillary: 173 mg/dL — ABNORMAL HIGH (ref 65–99)
Glucose-Capillary: 113 mg/dL — ABNORMAL HIGH (ref 65–99)
Glucose-Capillary: 112 mg/dL — ABNORMAL HIGH (ref 65–99)

## 2016-06-21 LAB — COMPREHENSIVE METABOLIC PANEL
ALT: 16 U/L — ABNORMAL LOW (ref 17–63)
ANION GAP: 10 (ref 5–15)
AST: 22 U/L (ref 15–41)
Albumin: 3 g/dL — ABNORMAL LOW (ref 3.5–5.0)
Alkaline Phosphatase: 195 U/L — ABNORMAL HIGH (ref 38–126)
BILIRUBIN TOTAL: 0.3 mg/dL (ref 0.3–1.2)
BUN: 19 mg/dL (ref 6–20)
CHLORIDE: 98 mmol/L — AB (ref 101–111)
CO2: 24 mmol/L (ref 22–32)
Calcium: 8.7 mg/dL — ABNORMAL LOW (ref 8.9–10.3)
Creatinine, Ser: 1.77 mg/dL — ABNORMAL HIGH (ref 0.61–1.24)
GFR, EST AFRICAN AMERICAN: 45 mL/min — AB (ref >60)
GFR, EST NON AFRICAN AMERICAN: 39 mL/min — AB (ref >60)
Glucose, Bld: 224 mg/dL — ABNORMAL HIGH (ref 65–99)
POTASSIUM: 3.7 mmol/L (ref 3.5–5.1)
Sodium: 132 mmol/L — ABNORMAL LOW (ref 135–145)
TOTAL PROTEIN: 6.9 g/dL (ref 6.5–8.1)

## 2016-06-21 LAB — PROTIME-INR
INR: 1.89
Prothrombin Time: 21.9 seconds — ABNORMAL HIGH (ref 11.4–15.2)

## 2016-06-21 LAB — LACTIC ACID, PLASMA: LACTIC ACID, VENOUS: 0.8 mmol/L (ref 0.5–1.9)

## 2016-06-21 LAB — MRSA PCR SCREENING: MRSA by PCR: NEGATIVE

## 2016-06-21 SURGERY — AMPUTATION, FOOT, RAY
Anesthesia: Regional | Site: Foot | Laterality: Right

## 2016-06-21 MED ORDER — LACTATED RINGERS IV SOLN
INTRAVENOUS | Status: DC | PRN
  Administered 2016-06-21: 18:00:00 via INTRAVENOUS

## 2016-06-21 MED ORDER — FENTANYL CITRATE (PF) 100 MCG/2ML IJ SOLN
INTRAMUSCULAR | Status: AC
  Filled 2016-06-21: qty 2

## 2016-06-21 MED ORDER — PRAMIPEXOLE DIHYDROCHLORIDE 0.125 MG PO TABS
0.1250 mg | ORAL_TABLET | Freq: Every day | ORAL | Status: DC
  Administered 2016-06-21 – 2016-06-22 (×2): 0.125 mg via ORAL
  Filled 2016-06-21 (×4): qty 1

## 2016-06-21 MED ORDER — ROSUVASTATIN CALCIUM 40 MG PO TABS
40.0000 mg | ORAL_TABLET | Freq: Every day | ORAL | Status: DC
  Administered 2016-06-22 – 2016-06-23 (×2): 40 mg via ORAL
  Filled 2016-06-21 (×3): qty 1

## 2016-06-21 MED ORDER — FENTANYL CITRATE (PF) 100 MCG/2ML IJ SOLN
INTRAMUSCULAR | Status: AC
  Administered 2016-06-21: 50 ug
  Filled 2016-06-21: qty 2

## 2016-06-21 MED ORDER — ACETAMINOPHEN 650 MG RE SUPP: 650.0000 mg | Freq: Four times a day (QID) | RECTAL | Status: DC | PRN

## 2016-06-21 MED ORDER — MIDODRINE HCL 5 MG PO TABS
5.0000 mg | ORAL_TABLET | Freq: Three times a day (TID) | ORAL | Status: DC
  Administered 2016-06-22 – 2016-06-23 (×5): 5 mg via ORAL
  Filled 2016-06-21 (×5): qty 1

## 2016-06-21 MED ORDER — INSULIN ASPART 100 UNIT/ML ~~LOC~~ SOLN: 0.0000 [IU] | Freq: Three times a day (TID) | SUBCUTANEOUS | Status: DC

## 2016-06-21 MED ORDER — WARFARIN SODIUM 5 MG PO TABS: 7.5000 mg | ORAL_TABLET | Freq: Once | ORAL | Status: DC

## 2016-06-21 MED ORDER — ACETAMINOPHEN 650 MG RE SUPP
650.0000 mg | Freq: Four times a day (QID) | RECTAL | Status: DC | PRN
Start: 2016-06-21 — End: 2016-06-23

## 2016-06-21 MED ORDER — PROPOFOL 10 MG/ML IV BOLUS
INTRAVENOUS | Status: AC
  Filled 2016-06-21: qty 20

## 2016-06-21 MED ORDER — PIPERACILLIN-TAZOBACTAM 3.375 G IVPB
3.3750 g | Freq: Three times a day (TID) | INTRAVENOUS | Status: DC
  Administered 2016-06-21 – 2016-06-23 (×6): 3.375 g via INTRAVENOUS
  Filled 2016-06-21 (×8): qty 50

## 2016-06-21 MED ORDER — METRONIDAZOLE 500 MG PO TABS
500.0000 mg | ORAL_TABLET | Freq: Three times a day (TID) | ORAL | Status: DC
  Administered 2016-06-21: 500 mg via ORAL
  Filled 2016-06-21: qty 1

## 2016-06-21 MED ORDER — OXYCODONE HCL 5 MG PO TABS: 5.0000 mg | ORAL_TABLET | Freq: Once | ORAL | Status: DC | PRN

## 2016-06-21 MED ORDER — ROPIVACAINE HCL 7.5 MG/ML IJ SOLN
INTRAMUSCULAR | Status: DC | PRN
  Administered 2016-06-21: 25 mL via PERINEURAL

## 2016-06-21 MED ORDER — FENTANYL CITRATE (PF) 100 MCG/2ML IJ SOLN: 25.0000 ug | INTRAMUSCULAR | Status: DC | PRN

## 2016-06-21 MED ORDER — DEXTROSE 5 % IV SOLN
2.0000 g | Freq: Once | INTRAVENOUS | Status: AC
  Administered 2016-06-21: 2 g via INTRAVENOUS
  Filled 2016-06-21: qty 2

## 2016-06-21 MED ORDER — ACETAMINOPHEN 325 MG PO TABS: 650.0000 mg | ORAL_TABLET | Freq: Four times a day (QID) | ORAL | Status: DC | PRN

## 2016-06-21 MED ORDER — ASPIRIN EC 81 MG PO TBEC
81.0000 mg | DELAYED_RELEASE_TABLET | Freq: Every day | ORAL | Status: DC
  Administered 2016-06-22 – 2016-06-23 (×2): 81 mg via ORAL
  Filled 2016-06-21 (×2): qty 1

## 2016-06-21 MED ORDER — WARFARIN SODIUM 5 MG PO TABS
7.5000 mg | ORAL_TABLET | Freq: Once | ORAL | Status: AC
  Administered 2016-06-21: 7.5 mg via ORAL
  Filled 2016-06-21: qty 2

## 2016-06-21 MED ORDER — INSULIN DETEMIR 100 UNIT/ML ~~LOC~~ SOLN: 40.0000 [IU] | Freq: Every day | SUBCUTANEOUS | Status: DC

## 2016-06-21 MED ORDER — DIGOXIN 125 MCG PO TABS
0.1250 mg | ORAL_TABLET | Freq: Every day | ORAL | Status: DC
  Administered 2016-06-21 – 2016-06-23 (×3): 0.125 mg via ORAL
  Filled 2016-06-21 (×3): qty 1

## 2016-06-21 MED ORDER — ONDANSETRON HCL 4 MG/2ML IJ SOLN: 4.0000 mg | Freq: Four times a day (QID) | INTRAMUSCULAR | Status: DC | PRN

## 2016-06-21 MED ORDER — INSULIN ASPART 100 UNIT/ML ~~LOC~~ SOLN
0.0000 [IU] | SUBCUTANEOUS | Status: DC
  Administered 2016-06-21: 2 [IU] via SUBCUTANEOUS
  Administered 2016-06-21: 1 [IU] via SUBCUTANEOUS
  Administered 2016-06-22: 5 [IU] via SUBCUTANEOUS

## 2016-06-21 MED ORDER — FENTANYL CITRATE (PF) 100 MCG/2ML IJ SOLN
100.0000 ug | Freq: Once | INTRAMUSCULAR | Status: AC
  Administered 2016-06-21: 100 ug via INTRAVENOUS
  Filled 2016-06-21: qty 2

## 2016-06-21 MED ORDER — MIDAZOLAM HCL 5 MG/5ML IJ SOLN
INTRAMUSCULAR | Status: DC | PRN
  Administered 2016-06-21: 2 mg via INTRAVENOUS

## 2016-06-21 MED ORDER — SODIUM CHLORIDE 0.9 % IV BOLUS (SEPSIS)
1000.0000 mL | Freq: Once | INTRAVENOUS | Status: AC
  Administered 2016-06-21: 1000 mL via INTRAVENOUS

## 2016-06-21 MED ORDER — OXYCODONE-ACETAMINOPHEN 5-325 MG PO TABS
1.0000 | ORAL_TABLET | Freq: Four times a day (QID) | ORAL | Status: DC | PRN
  Administered 2016-06-21 – 2016-06-22 (×5): 1 via ORAL
  Filled 2016-06-21 (×5): qty 1

## 2016-06-21 MED ORDER — GABAPENTIN 300 MG PO CAPS
300.0000 mg | ORAL_CAPSULE | Freq: Every day | ORAL | Status: DC
  Administered 2016-06-22 – 2016-06-23 (×2): 300 mg via ORAL
  Filled 2016-06-21 (×2): qty 1

## 2016-06-21 MED ORDER — MIDAZOLAM HCL 2 MG/2ML IJ SOLN
INTRAMUSCULAR | Status: AC
  Filled 2016-06-21: qty 2

## 2016-06-21 MED ORDER — VANCOMYCIN HCL IN DEXTROSE 1-5 GM/200ML-% IV SOLN
1000.0000 mg | INTRAVENOUS | Status: DC
  Administered 2016-06-22 – 2016-06-23 (×2): 1000 mg via INTRAVENOUS
  Filled 2016-06-21 (×2): qty 200

## 2016-06-21 MED ORDER — OXYCODONE HCL 5 MG/5ML PO SOLN: 5.0000 mg | Freq: Once | ORAL | Status: DC | PRN

## 2016-06-21 MED ORDER — DEXTROSE IN LACTATED RINGERS 5 % IV SOLN
INTRAVENOUS | Status: DC
  Administered 2016-06-21: 09:00:00 via INTRAVENOUS

## 2016-06-21 MED ORDER — WARFARIN - PHARMACIST DOSING INPATIENT: Freq: Every day | Status: DC

## 2016-06-21 MED ORDER — ONDANSETRON HCL 4 MG PO TABS: 4.0000 mg | ORAL_TABLET | Freq: Four times a day (QID) | ORAL | Status: DC | PRN

## 2016-06-21 MED ORDER — DEXTROSE 5 % IV SOLN: 2.0000 g | INTRAVENOUS | Status: DC

## 2016-06-21 MED ORDER — OXYCODONE HCL 5 MG PO TABS
5.0000 mg | ORAL_TABLET | ORAL | Status: DC | PRN
  Administered 2016-06-23: 10 mg via ORAL
  Filled 2016-06-21: qty 2

## 2016-06-21 MED ORDER — PANTOPRAZOLE SODIUM 40 MG PO TBEC
40.0000 mg | DELAYED_RELEASE_TABLET | Freq: Every day | ORAL | Status: DC
  Administered 2016-06-22 – 2016-06-23 (×2): 40 mg via ORAL
  Filled 2016-06-21 (×2): qty 1

## 2016-06-21 MED ORDER — INSULIN DETEMIR 100 UNIT/ML ~~LOC~~ SOLN
50.0000 [IU] | Freq: Every day | SUBCUTANEOUS | Status: DC
  Filled 2016-06-21: qty 0.5

## 2016-06-21 MED ORDER — SENNOSIDES-DOCUSATE SODIUM 8.6-50 MG PO TABS
1.0000 | ORAL_TABLET | Freq: Every evening | ORAL | Status: DC | PRN
  Administered 2016-06-22: 1 via ORAL
  Filled 2016-06-21: qty 1

## 2016-06-21 MED ORDER — VANCOMYCIN HCL 10 G IV SOLR
1500.0000 mg | Freq: Once | INTRAVENOUS | Status: AC
  Administered 2016-06-21: 1500 mg via INTRAVENOUS
  Filled 2016-06-21: qty 1500

## 2016-06-21 MED ORDER — METOCLOPRAMIDE HCL 5 MG/ML IJ SOLN: 5.0000 mg | Freq: Three times a day (TID) | INTRAMUSCULAR | Status: DC | PRN

## 2016-06-21 MED ORDER — INSULIN ASPART 100 UNIT/ML ~~LOC~~ SOLN: 0.0000 [IU] | Freq: Every day | SUBCUTANEOUS | Status: DC

## 2016-06-21 MED ORDER — SODIUM CHLORIDE 0.9 % IV SOLN
INTRAVENOUS | Status: DC
  Administered 2016-06-22: via INTRAVENOUS

## 2016-06-21 MED ORDER — METHOCARBAMOL 1000 MG/10ML IJ SOLN
500.0000 mg | Freq: Four times a day (QID) | INTRAVENOUS | Status: DC | PRN
  Filled 2016-06-21: qty 5

## 2016-06-21 MED ORDER — 0.9 % SODIUM CHLORIDE (POUR BTL) OPTIME
TOPICAL | Status: DC | PRN
Start: 2016-06-21 — End: 2016-06-21
  Administered 2016-06-21: 1000 mL

## 2016-06-21 MED ORDER — DEXTROSE 50 % IV SOLN
25.0000 mL | Freq: Once | INTRAVENOUS | Status: AC
  Administered 2016-06-21: 25 mL via INTRAVENOUS
  Filled 2016-06-21: qty 50

## 2016-06-21 MED ORDER — METOCLOPRAMIDE HCL 5 MG PO TABS
5.0000 mg | ORAL_TABLET | Freq: Three times a day (TID) | ORAL | Status: DC | PRN
Start: 2016-06-21 — End: 2016-06-23

## 2016-06-21 MED ORDER — METHOCARBAMOL 500 MG PO TABS
500.0000 mg | ORAL_TABLET | Freq: Four times a day (QID) | ORAL | Status: DC | PRN
  Administered 2016-06-22 – 2016-06-23 (×2): 500 mg via ORAL
  Filled 2016-06-21 (×2): qty 1

## 2016-06-21 MED ORDER — CHLORHEXIDINE GLUCONATE 4 % EX LIQD
60.0000 mL | Freq: Once | CUTANEOUS | Status: AC
  Administered 2016-06-21: 4 via TOPICAL

## 2016-06-21 MED ORDER — CARVEDILOL 3.125 MG PO TABS
3.1250 mg | ORAL_TABLET | Freq: Two times a day (BID) | ORAL | Status: DC
  Administered 2016-06-21 – 2016-06-23 (×4): 3.125 mg via ORAL
  Filled 2016-06-21 (×4): qty 1

## 2016-06-21 MED ORDER — INSULIN DETEMIR 100 UNIT/ML ~~LOC~~ SOLN
15.0000 [IU] | Freq: Every day | SUBCUTANEOUS | Status: DC
  Administered 2016-06-21: 15 [IU] via SUBCUTANEOUS
  Filled 2016-06-21 (×2): qty 0.15

## 2016-06-21 MED ORDER — POVIDONE-IODINE 10 % EX SWAB
2.0000 "application " | Freq: Once | CUTANEOUS | Status: AC
  Administered 2016-06-21: 2 via TOPICAL

## 2016-06-21 MED ORDER — LIDOCAINE 2% (20 MG/ML) 5 ML SYRINGE
INTRAMUSCULAR | Status: AC
  Filled 2016-06-21: qty 5

## 2016-06-21 SURGICAL SUPPLY — 31 items
BLADE SAW SGTL MED 73X18.5 STR (BLADE) IMPLANT
BLADE SURG 21 STRL SS (BLADE) ×3 IMPLANT
BNDG COHESIVE 4X5 TAN STRL (GAUZE/BANDAGES/DRESSINGS) ×5 IMPLANT
BNDG GAUZE ELAST 4 BULKY (GAUZE/BANDAGES/DRESSINGS) ×5 IMPLANT
COVER SURGICAL LIGHT HANDLE (MISCELLANEOUS) ×6 IMPLANT
DRAPE U-SHAPE 47X51 STRL (DRAPES) ×6 IMPLANT
DRSG ADAPTIC 3X8 NADH LF (GAUZE/BANDAGES/DRESSINGS) ×3 IMPLANT
DRSG PAD ABDOMINAL 8X10 ST (GAUZE/BANDAGES/DRESSINGS) ×6 IMPLANT
DURAPREP 26ML APPLICATOR (WOUND CARE) ×3 IMPLANT
ELECT REM PT RETURN 9FT ADLT (ELECTROSURGICAL) ×3
ELECTRODE REM PT RTRN 9FT ADLT (ELECTROSURGICAL) ×1 IMPLANT
GAUZE SPONGE 4X4 12PLY STRL (GAUZE/BANDAGES/DRESSINGS) ×3 IMPLANT
GLOVE BIOGEL PI IND STRL 9 (GLOVE) ×1 IMPLANT
GLOVE BIOGEL PI INDICATOR 9 (GLOVE) ×2
GLOVE SURG ORTHO 9.0 STRL STRW (GLOVE) ×3 IMPLANT
GOWN STRL REUS W/ TWL LRG LVL3 (GOWN DISPOSABLE) ×1 IMPLANT
GOWN STRL REUS W/ TWL XL LVL3 (GOWN DISPOSABLE) ×2 IMPLANT
GOWN STRL REUS W/TWL LRG LVL3 (GOWN DISPOSABLE) ×3
GOWN STRL REUS W/TWL XL LVL3 (GOWN DISPOSABLE) ×6
KIT BASIN OR (CUSTOM PROCEDURE TRAY) ×3 IMPLANT
KIT ROOM TURNOVER OR (KITS) ×3 IMPLANT
NS IRRIG 1000ML POUR BTL (IV SOLUTION) ×3 IMPLANT
PACK ORTHO EXTREMITY (CUSTOM PROCEDURE TRAY) ×3 IMPLANT
PAD ARMBOARD 7.5X6 YLW CONV (MISCELLANEOUS) ×6 IMPLANT
SPONGE LAP 18X18 X RAY DECT (DISPOSABLE) ×3 IMPLANT
STOCKINETTE IMPERVIOUS LG (DRAPES) IMPLANT
SUT ETHILON 2 0 PSLX (SUTURE) ×6 IMPLANT
TOWEL OR 17X24 6PK STRL BLUE (TOWEL DISPOSABLE) ×3 IMPLANT
TOWEL OR 17X26 10 PK STRL BLUE (TOWEL DISPOSABLE) ×3 IMPLANT
UNDERPAD 30X30 (UNDERPADS AND DIAPERS) ×3 IMPLANT
WATER STERILE IRR 1000ML POUR (IV SOLUTION) ×3 IMPLANT

## 2016-06-21 NOTE — ED Notes (Signed)
Attempted to call report to [facility] [address] x 1. 

## 2016-06-21 NOTE — Progress Notes (Addendum)
I agree with the history and physical as per Dr. Criselda Peaches  65 year old male Status post CABG X1 + VSD repair 03/13/2016 Right RAY amputation 9/23 Hyperlipidemia Current smoker Diabetes mellitus type 2 Paroxysmal A. Fib CHad2Vasc2 score=3 on digoxin and Coumadin Systolic heart failure NYHA 2-3 CIWA scoreEcho 03/19/2016 40-45% -on beta blocker or ACE because of hypotension--on torsemide and spironolactone This hypotension on Midrin Home weight 150 pounds  He is followed by Dr. Sharol Given for his lower extremity issues and was seen by a vascular specialist and felt no reconstruction of vesicles were present and that this was a microcirculatory issue, but came by neuropathy and was started on Neurontin + Percocet  on 05/22/2016  On follow-up 06/05/2016 was doing excellent and wounds were healing well with beefy granulation and Silvadene dressings and was on nitroglycerin doing well   gangrene noted on the great toe over the course of than past 2-3 days when he got sick his sugars were in the 500 range Note Increasing drainage In the hospital when he is first seen temperature 99.5 WC 15 x-ray showed destruction distal phalanx  Dr. Sharol Given has seen and patient will be going for surgery 12/21  Pleasant oriented no apparent distress Chest clear no added sound S1-S2 no murmur rub or gallop Demarcated great toe With oozing from prior surgical sites and cellulitis halfway up midfoot  Had a hypoglycemic event of 53 this morning Given D50 and blood sugar is now 180 Place on D5 Expect will do reasonably well postop and is moderate to high risk but should be clear for surgery as has undergone prior surgery with orthopedics recently  Weightbearing status and further planning including pain management postop Coumadin has been held pending intervention should be resumed at discretion of orthopedics Cefepime and Flagyl have been discontinued in favor of Zosyn because of interaction with Flagyl and Coumadin  causing supratherapeutic INR  as per orthopedics  Verneita Griffes, MD [facility] ([phone]

## 2016-06-21 NOTE — Consult Note (Signed)
ORTHOPAEDIC CONSULTATION  REQUESTING PHYSICIAN: Nita Sells, MD  Chief Complaint: Progressive gangrene right forefoot  HPI: William [name] is a 65 y.o. male who presents with progressive gangrene right forefoot. Patient is status post limb salvage intervention with first and second Ray amputation on the right approximately 3 months ago. Patient is also status post angioplasty to the right lower extremity.  Past Medical History:  Diagnosis Date  . AKI (acute kidney injury) ([place])    With STEMI in 2017  . Chronic systolic CHF (congestive heart failure) ([place])   . Diabetes mellitus without complication ([place])   . Hyperlipidemia   . Hypertension   . Paroxysmal atrial fibrillation ([name])   . STEMI (ST elevation myocardial infarction) ([facility]) 2017   Past Surgical History:  Procedure Laterality Date  . AMPUTATION Right 03/23/2016   Procedure: 1st and 2nd Ray Amputation Right Foot;  Surgeon: Newt Minion, MD;  Location: [facility];  Service: Orthopedics;  Laterality: Right;  . CARDIAC CATHETERIZATION N/A 03/13/2016   Procedure: Right/Left Heart Cath and Coronary Angiography;  Surgeon: Sherren Mocha, MD;  Location: [facility];  Service: Cardiovascular;  Laterality: N/A;  . CARDIAC CATHETERIZATION N/A 03/13/2016   Procedure: IABP Insertion;  Surgeon: Sherren Mocha, MD;  Location: [facility];  Service: Cardiovascular;  Laterality: N/A;  . CORONARY ARTERY BYPASS GRAFT N/A 03/13/2016   Procedure: CORONARY ARTERY BYPASS GRAFTING (CABG) x 1 (SVG to OM) with EVH from [place];  Surgeon: Ivin Poot, MD;  Location: [facility];  Service: Open Heart Surgery;  Laterality: N/A;  . LOWER EXTREMITY ANGIOGRAM  05/02/2016   Procedure: Lower Extremity Angiogram;  Surgeon: Wellington Hampshire, MD;  Location: [facility];  Service: Cardiovascular;;  Limited left femoral runoff right femoral runoff  . PERIPHERAL VASCULAR CATHETERIZATION N/A 05/02/2016   Procedure: Abdominal  Aortogram;  Surgeon: Wellington Hampshire, MD;  Location: [facility];  Service: Cardiovascular;  Laterality: N/A;  . PERIPHERAL VASCULAR CATHETERIZATION Right 05/02/2016   Procedure: Peripheral Vascular Balloon Angioplasty;  Surgeon: Wellington Hampshire, MD;  Location: [facility];  Service: Cardiovascular;  Laterality: Right;  SFA  . TEE WITHOUT CARDIOVERSION N/A 03/13/2016   Procedure: TRANSESOPHAGEAL ECHOCARDIOGRAM (TEE);  Surgeon: Ivin Poot, MD;  Location: [facility];  Service: Open Heart Surgery;  Laterality: N/A;  . VSD REPAIR N/A 03/13/2016   Procedure: VENTRICULAR SEPTAL DEFECT (VSD) REPAIR;  Surgeon: Ivin Poot, MD;  Location: [facility];  Service: Open Heart Surgery;  Laterality: N/A;   Social History   Social History  . Marital status: Married    Spouse name: Lavell Luster  . Number of children: N/A  . Years of education: N/A   Social History Main Topics  . Smoking status: Former Smoker    Packs/day: 0.50    Types: Cigarettes    Quit date: 11/20/2015  . Smokeless tobacco: Never Used  . Alcohol use No  . Drug use: No  . Sexual activity: Not Asked   Other Topics Concern  . None   Social History Narrative  . None   Family History  Problem Relation Age of Onset  . Diabetes Maternal Grandmother   . Diabetes Mother   . Aneurysm Mother   . Peripheral Artery Disease Mother   . Coronary artery disease Mother   . Peptic Ulcer Father    - negative except otherwise stated in the family history section Allergies  Allergen Reactions  . Morphine And  Related Other (See Comments)    UNSPECIFIED REACTION "Pt said it was too much"    Prior to Admission medications   Medication Sig Start Date End Date Taking? Authorizing Provider  aspirin EC 81 MG tablet Take 81 mg by mouth daily.   Yes Historical Provider, MD  carvedilol (COREG) 3.125 MG tablet Take 1 tablet (3.125 mg total) by mouth 2 (two) times daily. 05/28/16 08/26/16 Yes [name] Friar, PA-C  digoxin (LANOXIN)  0.125 MG tablet Take 1 tablet (0.125 mg total) by mouth daily. 05/28/16  Yes [name] Friar, PA-C  gabapentin (NEURONTIN) 300 MG capsule Take 300 mg by mouth every morning.    Yes Historical Provider, MD  insulin detemir (LEVEMIR) 100 UNIT/ML injection Inject 40-50 Units into the skin 2 (two) times daily. Take 50 every morning  Take 40 units every evening   Yes Historical Provider, MD  midodrine (PROAMATINE) 5 MG tablet Take 1 tablet (5 mg total) by mouth 3 (three) times daily with meals. 05/28/16  Yes [name] Friar, PA-C  nitroGLYCERIN (NITRODUR - DOSED IN MG/24 HR) 0.4 mg/hr patch Place 1 patch (0.4 mg total) onto the skin daily. 05/22/16  Yes Newt Minion, MD  oxyCODONE-acetaminophen (PERCOCET/ROXICET) 5-325 MG tablet Take 1 tablet by mouth every 6 (six) hours as needed for severe pain. 05/22/16  Yes Newt Minion, MD  pantoprazole (PROTONIX) 40 MG tablet Take 40 mg by mouth daily.   Yes Historical Provider, MD  pramipexole (MIRAPEX) 0.125 MG tablet Take 0.125 mg by mouth at bedtime.   Yes Historical Provider, MD  rosuvastatin (CRESTOR) 40 MG tablet Take 1 tablet (40 mg total) by mouth daily. 04/02/16  Yes Donielle Liston Alba, PA-C  senna (SENOKOT) 8.6 MG TABS tablet Take 1-2 tablets by mouth daily as needed for mild constipation.   Yes Historical Provider, MD  silver sulfADIAZINE (SILVADENE) 1 % cream Apply 1 application topically daily. 06/13/16  Yes Chrystie Nose, NP  spironolactone (ALDACTONE) 25 MG tablet Take 1 tablet (25 mg total) by mouth daily. 05/28/16  Yes [name] Friar, PA-C  torsemide (DEMADEX) 20 MG tablet Take 1 tablet (20 mg total) by mouth 3 (three) times a week. Every Mon, Wed and Fri 05/28/16  Yes [name] Friar, PA-C  warfarin (COUMADIN) 5 MG tablet Take 1-1.5 tablets (5-7.5 mg total) by mouth daily. Take 5 mg (1 tab) daily except 7.5 mg (1 and 1/2 tabs) on Monday, Wednesday and Friday 05/28/16  Yes [name] Friar, PA-C   Dg Foot  Complete Right  Result Date: 06/20/2016 CLINICAL DATA:  Infected forefoot. EXAM: RIGHT FOOT COMPLETE - 3+ VIEW COMPARISON:  None. FINDINGS: Amputations of the first and second rays at the level of the first tarsometatarsal articulation and at the second distal metatarsal shaft. There is new bone loss at the distal tuft of the third distal phalanx. There is soft tissue swelling and probable soft tissue gas in the third phalanx. The findings are suspicious for osteomyelitis. IMPRESSION: Probable osteomyelitis of the third digit. Electronically Signed   By: Andreas Newport M.D.   On: 06/20/2016 23:52   - pertinent xrays, CT, MRI studies were reviewed and independently interpreted  Positive ROS: All other systems have been reviewed and were otherwise negative with the exception of those mentioned in the HPI and as above.  Physical Exam: General: Alert, no acute distress Psychiatric: Patient is competent for consent with normal mood and affect Lymphatic: No axillary or cervical lymphadenopathy Cardiovascular: No pedal edema  Respiratory: No cyanosis, no use of accessory musculature GI: No organomegaly, abdomen is soft and non-tender  Skin:  Patient has black gangrene of the third toe right foot with ischemic changes throughout the forefoot. There is no ascending cellulitis   Neurologic: Patient does not have protective sensation bilateral lower extremities.   MUSCULOSKELETAL:  Examination patient does not have a palpable pulses, right foot is warm left foot has no ulcerations. There is no purulence no drainage.  Assessment: Assessment: Peripheral vascular disease with gangrene of the right forefoot 3 months status post first and second ray amputations.  Plan: Plan: We'll plan for right transmetatarsal amputation. Postoperatively patient should be touchdown weightbearing on the right plan to follow-up in the office 2 weeks postoperatively.  Thank you for the consult and the opportunity to  see Mr. Dayden Ballor, [place] [phone] 7:38 AM

## 2016-06-21 NOTE — Progress Notes (Signed)
1730 Pt to preop, report was given to [place], pt's daughter present.

## 2016-06-21 NOTE — Anesthesia Procedure Notes (Addendum)
Anesthesia Regional Block:  Popliteal block  Pre-Anesthetic Checklist: ,, timeout performed, Correct Patient, Correct Site, Correct Laterality, Correct Procedure, Correct Position, site marked, Risks and benefits discussed,  Surgical consent,  Pre-op evaluation,  At surgeon's request and post-op pain management  Laterality: Lower and Right  Prep: chloraprep       Needles:  Injection technique: Single-shot  Needle Type: Echogenic Stimulator Needle          Additional Needles:  Procedures: ultrasound guided (picture in chart) and nerve stimulator Popliteal block  Nerve Stimulator or Paresthesia:  Response: plantar, 0.5 mA,   Additional Responses:   Narrative:  Start time: 06/21/2016 6:04 PM End time: 06/21/2016 6:11 PM Injection made incrementally with aspirations every 5 mL.  Performed by: Personally  Anesthesiologist: Aaban Griep  Additional Notes: H+P and labs reviewed, risks and benefits discussed with patient, procedure tolerated well without complications

## 2016-06-21 NOTE — Progress Notes (Signed)
Pharmacy Anticoagulation Note  William [name] is a 65 y.o. male admitted on 06/21/2016 with R-diabetic foot infection. Patient is now s/ R TMA. Will resume warfarin for hx Afib.  INR 1.89 this AM  Plan: 1. Coumadin 7.5mg  PO x 1 tonight. 2. Daily INR  [employer], Pharm.D., BCPS Clinical Pharmacist Pager [phone] 06/21/2016 8:42 PM

## 2016-06-21 NOTE — Transfer of Care (Deleted)
Immediate Anesthesia Transfer of Care Note  Patient: William [name]  Procedure(s) Performed: Procedure(s): RIGHT TRANSMETATARSAL AMPUTATION (Right)  Patient Location: PACU  Anesthesia Type:MAC and Regional  Level of Consciousness: awake, alert , oriented and patient cooperative  Airway & Oxygen Therapy: Patient Spontanous Breathing  Post-op Assessment: Report given to RN, Post -op Vital signs reviewed and stable and Patient moving all extremities X 4  Post vital signs: Reviewed and stable  Last Vitals:  Vitals:   06/21/16 0900 06/21/16 1500  BP: (!) 115/58 (!) 95/55  Pulse: 81 (!) 102  Resp:    Temp: 36.3 C 37.3 C    Last Pain:  Vitals:   06/21/16 1500  TempSrc: Oral  PainSc:       Patients Stated Pain Goal: 2 ([id number])  Complications: No apparent anesthesia complications

## 2016-06-21 NOTE — Progress Notes (Signed)
Pharmacy Antibiotic & Anticoagulation Note  William [name] is a 65 y.o. male admitted on 06/21/2016 with R-diabetic foot infection. Ortho consulted - planning R-TMA later today. The patient will be transitioning from Rocephin/Flagyl/Vanc to Zosyn + Vancomycin today to avoid Flagyl's interaction with Warfarin. The patient is on warfarin for hx Afib.  The patient's warfarin dose will be held until clarification can be made post-op by ortho regarding restart plans. INR 1.89 this AM  Plan: 1. Start Zosyn 3.375g IV every 8 hours EI 2. Continue Vancomycin 1g IV every 24 hours 3. Hold warfarin pending plans to resume post-op by ortho 4. Will continue to follow renal function, culture results, LOT, and antibiotic de-escalation plans 5. Will continue to monitor for any signs/symptoms of bleeding and will follow up plans post-op today  Height: 5\' 5"  (165.1 cm) Weight: 149 lb (67.6 kg) IBW/kg (Calculated) : 61.5  Temp (24hrs), Avg:98.9 F (37.2 C), Min:97.4 F (36.3 C), Max:99.9 F (37.7 C)   Recent Labs Lab 06/20/16 2329 06/21/16 0327  WBC 15.8*  --   CREATININE 1.77*  --   LATICACIDVEN  --  0.8    Estimated Creatinine Clearance: 36.2 mL/min (by C-G formula based on SCr of 1.77 mg/dL (H)).    Allergies  Allergen Reactions  . Morphine And Related Other (See Comments)    UNSPECIFIED REACTION "Pt said it was too much"     Antimicrobials this admission:  CTX + Flagyl 12/21 x 1 dose Vanc 12/21 >> Zosyn 12/21 >>  Dose adjustments this admission:   Microbiology results:  12/21 BCx >> 12/21 MRSA PCR >>  Thank you for allowing pharmacy to be a part of this patient's care.  Alycia Rossetti, PharmD, BCPS Clinical Pharmacist Pager: [phone] Clinical phone for 06/21/2016 from 7a-3:30p: [phone] If after 3:30p, please call main pharmacy at: [phone] 06/21/2016 10:39 AM

## 2016-06-21 NOTE — ED Notes (Signed)
Attempted to call report to [place] x 2.

## 2016-06-21 NOTE — Op Note (Signed)
06/21/2016  7:08 PM  PATIENT:  William [name]    PRE-OPERATIVE DIAGNOSIS:  Right foot gangrene  POST-OPERATIVE DIAGNOSIS:  Same  PROCEDURE:  RIGHT TRANSMETATARSAL AMPUTATION  SURGEON:  Newt Minion, MD  PHYSICIAN ASSISTANT:None ANESTHESIA:   General  PREOPERATIVE INDICATIONS:  Xzavior Connally is a  65 y.o. male with a diagnosis of Right foot gangrene who failed conservative measures and elected for surgical management.    The risks benefits and alternatives were discussed with the patient preoperatively including but not limited to the risks of infection, bleeding, nerve injury, cardiopulmonary complications, the need for revision surgery, among others, and the patient was willing to proceed.  OPERATIVE IMPLANTS: None  OPERATIVE FINDINGS: Good petechial bleeding, no necrotic tissue no abscess.  OPERATIVE PROCEDURE: Patient was brought to operating room and underwent a regional anesthetic. After adequate levels anesthesia obtained patient's right lower extremity was prepped using DuraPrep draped into a sterile field. A timeout was called. A fishmouth incision was made just proximal to the gangrenous necrotic tissue. This was carried sharply down to bone. Transmetatarsal amputation was performed using oscillating saw. Electrocautery was used for hemostasis. The wound was irrigated with normal saline. The incision was closed using 2-0 nylon. A sterile compressive dressing was applied. Patient was taken the PACU in stable condition.

## 2016-06-21 NOTE — Progress Notes (Addendum)
ANTICOAGULATION CONSULT NOTE - Initial Consult  Pharmacy Consult for Coumadin and Vancocin Indication: atrial fibrillation and suspected diabetic foot infection  Allergies  Allergen Reactions  . Morphine And Related Other (See Comments)    UNSPECIFIED REACTION "Pt said it was too much"     Patient Measurements: Weight: 146 lb (66.2 kg)  Vital Signs: Temp: 99.5 F (37.5 C) (12/20 2307) Temp Source: Oral (12/20 2307) BP: 111/61 (12/21 0315) Pulse Rate: 96 (12/21 0315)  Labs:  Recent Labs  06/20/16 2329 06/21/16 0235  HGB 10.6*  --   HCT 31.7*  --   PLT 294  --   LABPROT  --  21.9*  INR  --  1.89  CREATININE 1.77*  --     Estimated Creatinine Clearance: 36.2 mL/min (by C-G formula based on SCr of 1.77 mg/dL (H)).   Medical History: Past Medical History:  Diagnosis Date  . AKI (acute kidney injury) ([place])    With STEMI in 2017  . Chronic systolic CHF (congestive heart failure) ([place])   . Diabetes mellitus without complication ([place])   . Hyperlipidemia   . Hypertension   . Paroxysmal atrial fibrillation ([name])   . STEMI (ST elevation myocardial infarction) ([place]) 2017    Assessment: 65yo male c/o worsening right third toe pain w/ blackness and surrounding redness, has already had first two toes amputated, to begin ABX.  Also to continue Coumadin for Afib; current INR slightly below goal w/ last Coumadin dose taken 12/20.  Goal of Therapy:  INR 2-3  Vanc trough 15-20   Plan:  Rec'd vanc 1500mg  IV in ED; will continue with vancomycin 1000mg  IV Q24H and monitor CBC, Cx, and levels prn.  Will give boosted Coumadin dose of 7.5mg  po x1 this evening and monitor INR for dose adjustments; of note if pt must continue Flagyl INR will likely increase quickly.  Wynona Neat, PharmD, BCPS  06/21/2016,3:40 AM

## 2016-06-21 NOTE — ED Provider Notes (Signed)
[facility] DEPT Provider Note   CSN: [id number] Arrival date & time: 06/20/16  2258   By signing my name below, I, Delton Prairie, attest that this documentation has been prepared under the direction and in the presence of Everlene Balls, MD  Electronically Signed: Delton Prairie, ED Scribe. 06/21/16. 2:02 AM.   History   Chief Complaint Chief Complaint  Patient presents with  . Toe Pain    toe black, fever   The history is provided by the patient. No language interpreter was used.   HPI Comments:  William [name] is a 65 y.o. male, with a hx of hyperlipidemia, HTN , DM and PSHx of amputation of right 1st and 2nd toes, who presents to the Emergency Department complaining of worsening, right 3rd toe pain x yesterday. Pt also notes worsening blackness to his toe and surrounding redness x yesterday. Daughter notes the pt was sick about a week ago and began to feel bad again yesterday. No alleviating factors noted. Pt denies drainage, a hx of liver failure and any other associated symptoms at this time. He is on Warfarin and has been off of percocet x 4 days.  Past Medical History:  Diagnosis Date  . Diabetes mellitus without complication ([place])   . Hyperlipidemia   . Hypertension     Patient Active Problem List   Diagnosis Date Noted  . Atherosclerosis of native artery of right lower extremity with gangrene ([place]) 06/05/2016  . Diabetic polyneuropathy associated with type 2 diabetes mellitus ([place]) 06/05/2016  . Chronic systolic HF (heart failure) ([place]) 05/28/2016  . Encounter for therapeutic drug monitoring 04/05/2016  . Hypoalbuminemia 03/21/2016  . CAD (coronary artery disease), native coronary artery 03/16/2016  . VSD (ventricular septal defect) 03/16/2016  . Hx of CABG   . Paroxysmal atrial fibrillation ([place]) 03/15/2016  . MI (myocardial infarction) 03/13/2016  . Erectile dysfunction associated with type 2 diabetes mellitus ([place]) 04/07/2012  . Counseling and coordination of care  01/05/2012  . Mood disorder ([place]) 03/12/2011  . Essential hypertension 03/17/2010  . ONYCHOMYCOSIS, TOENAILS 01/03/2010  . NICOTINE ADDICTION 12/28/2008  . Hyperlipidemia 11/01/2008  . GERD 11/01/2008  . SHOULDER PAIN, LEFT, CHRONIC 11/01/2008  . NECK PAIN, CHRONIC 11/01/2008  . DM (diabetes mellitus), type 2, uncontrolled ([place]) 09/23/2008    Past Surgical History:  Procedure Laterality Date  . AMPUTATION Right 03/23/2016   Procedure: 1st and 2nd Ray Amputation Right Foot;  Surgeon: Newt Minion, MD;  Location: [place];  Service: Orthopedics;  Laterality: Right;  . CARDIAC CATHETERIZATION N/A 03/13/2016   Procedure: Right/Left Heart Cath and Coronary Angiography;  Surgeon: Sherren Mocha, MD;  Location: [place] [facility];  Service: Cardiovascular;  Laterality: N/A;  . CARDIAC CATHETERIZATION N/A 03/13/2016   Procedure: IABP Insertion;  Surgeon: Sherren Mocha, MD;  Location: [facility];  Service: Cardiovascular;  Laterality: N/A;  . CORONARY ARTERY BYPASS GRAFT N/A 03/13/2016   Procedure: CORONARY ARTERY BYPASS GRAFTING (CABG) x 1 (SVG to OM) with EVH from [place];  Surgeon: Ivin Poot, MD;  Location: [facility];  Service: Open Heart Surgery;  Laterality: N/A;  . LOWER EXTREMITY ANGIOGRAM  05/02/2016   Procedure: Lower Extremity Angiogram;  Surgeon: Wellington Hampshire, MD;  Location: [facility];  Service: Cardiovascular;;  Limited left femoral runoff right femoral runoff  . PERIPHERAL VASCULAR CATHETERIZATION N/A 05/02/2016   Procedure: Abdominal Aortogram;  Surgeon: Wellington Hampshire, MD;  Location: [facility];  Service: Cardiovascular;  Laterality: N/A;  . PERIPHERAL VASCULAR CATHETERIZATION Right 05/02/2016   Procedure: Peripheral Vascular Balloon Angioplasty;  Surgeon: Wellington Hampshire, MD;  Location: [facility];  Service: Cardiovascular;  Laterality: Right;  SFA  . TEE WITHOUT CARDIOVERSION N/A 03/13/2016   Procedure: TRANSESOPHAGEAL  ECHOCARDIOGRAM (TEE);  Surgeon: Ivin Poot, MD;  Location: [facility];  Service: Open Heart Surgery;  Laterality: N/A;  . VSD REPAIR N/A 03/13/2016   Procedure: VENTRICULAR SEPTAL DEFECT (VSD) REPAIR;  Surgeon: Ivin Poot, MD;  Location: [facility];  Service: Open Heart Surgery;  Laterality: N/A;    Home Medications    Prior to Admission medications   Medication Sig Start Date End Date Taking? Authorizing Provider  aspirin EC 81 MG tablet Take 81 mg by mouth daily.    Historical Provider, MD  carvedilol (COREG) 3.125 MG tablet Take 1 tablet (3.125 mg total) by mouth 2 (two) times daily. 05/28/16 08/26/16  [name] Friar, PA-C  digoxin (LANOXIN) 0.125 MG tablet Take 1 tablet (0.125 mg total) by mouth daily. 05/28/16   [name] Friar, PA-C  docusate sodium (COLACE) 100 MG capsule Take 100 mg by mouth 2 (two) times daily.    Historical Provider, MD  gabapentin (NEURONTIN) 300 MG capsule Take 300 mg by mouth daily.     Historical Provider, MD  insulin detemir (LEVEMIR) 100 UNIT/ML injection Inject 40-50 Units into the skin 2 (two) times daily. Take 50 every morning  Take 40 units every evening    Historical Provider, MD  midodrine (PROAMATINE) 5 MG tablet Take 1 tablet (5 mg total) by mouth 3 (three) times daily with meals. 05/28/16   [name] Friar, PA-C  nitroGLYCERIN (NITRODUR - DOSED IN MG/24 HR) 0.4 mg/hr patch Place 1 patch (0.4 mg total) onto the skin daily. 05/22/16   Newt Minion, MD  oxyCODONE-acetaminophen (PERCOCET/ROXICET) 5-325 MG tablet Take 1 tablet by mouth every 6 (six) hours as needed for severe pain. 05/22/16   Newt Minion, MD  pantoprazole (PROTONIX) 40 MG tablet Take 40 mg by mouth daily.    Historical Provider, MD  pramipexole (MIRAPEX) 0.125 MG tablet Take 0.125 mg by mouth at bedtime.    Historical Provider, MD  rosuvastatin (CRESTOR) 40 MG tablet Take 1 tablet (40 mg total) by mouth daily. 04/02/16   Donielle Liston Alba, PA-C  silver sulfADIAZINE  (SILVADENE) 1 % cream Apply 1 application topically daily. 06/13/16   Chrystie Nose, NP  spironolactone (ALDACTONE) 25 MG tablet Take 1 tablet (25 mg total) by mouth daily. 05/28/16   [name] Friar, PA-C  torsemide (DEMADEX) 20 MG tablet Take 1 tablet (20 mg total) by mouth 3 (three) times a week. Every Mon, Wed and Fri 05/28/16   [name] Friar, PA-C  warfarin (COUMADIN) 5 MG tablet Take 1-1.5 tablets (5-7.5 mg total) by mouth daily. Take 5 mg (1 tab) daily except 7.5 mg (1 and 1/2 tabs) on Monday, Wednesday and Friday 05/28/16   [name] Friar, PA-C    Family History No family history on file.  Social History Social History  Substance Use Topics  . Smoking status: Former Smoker    Packs/day: 0.50    Types: Cigarettes  . Smokeless tobacco: Never Used  . Alcohol use No     Allergies   Morphine and related   Review of Systems Review of Systems 10 Systems reviewed and are negative for acute change except as noted in the HPI.  Physical Exam Updated Vital  Signs BP 107/63   Pulse 96   Temp 99.5 F (37.5 C) (Oral)   Resp 18   Wt 146 lb (66.2 kg)   SpO2 98%   BMI 24.30 kg/m    Physical Exam  Constitutional: He is oriented to person, place, and time. Vital signs are normal. He appears well-developed and well-nourished.  Non-toxic appearance. He does not appear ill. No distress.  HENT:  Head: Normocephalic and atraumatic.  Nose: Nose normal.  Mouth/Throat: Oropharynx is clear and moist. No oropharyngeal exudate.  Eyes: Conjunctivae and EOM are normal. Pupils are equal, round, and reactive to light. No scleral icterus.  Neck: Normal range of motion. Neck supple. No tracheal deviation, no edema, no erythema and normal range of motion present. No thyroid mass and no thyromegaly present.  Cardiovascular: Normal rate, regular rhythm, S1 normal, S2 normal, normal heart sounds, intact distal pulses and normal pulses.  Exam reveals no gallop and no friction  rub.   No murmur heard. 2 + pulses   Pulmonary/Chest: Effort normal and breath sounds normal. No respiratory distress. He has no wheezes. He has no rhonchi. He has no rales.  Abdominal: Soft. Normal appearance and bowel sounds are normal. He exhibits no distension, no ascites and no mass. There is no hepatosplenomegaly. There is no tenderness. There is no rebound, no guarding and no CVA tenderness.  Musculoskeletal: Normal range of motion. He exhibits edema. He exhibits no tenderness.  S/p amputation of 1st and 2nd toe on right foot. 3rd toe is black with mild bleeding and purulent drainage. TTP and surrounding erythema of mid foot. Diffuse swelling of foot.   Lymphadenopathy:    He has no cervical adenopathy.  Neurological: He is alert and oriented to person, place, and time. He has normal strength. No cranial nerve deficit or sensory deficit.  Skin: Skin is warm, dry and intact. No petechiae and no rash noted. He is not diaphoretic. There is erythema. No pallor.  Nursing note and vitals reviewed.   ED Treatments / Results  DIAGNOSTIC STUDIES:  Oxygen Saturation is 98% on RA, normal by my interpretation.    COORDINATION OF CARE:  1:46 AM Discussed treatment plan with pt at bedside and pt agreed to plan.  Labs (all labs ordered are listed, but only abnormal results are displayed) Labs Reviewed  CBC WITH DIFFERENTIAL/PLATELET - Abnormal; Notable for the following:       Result Value   WBC 15.8 (*)    RBC 4.09 (*)    Hemoglobin 10.6 (*)    HCT 31.7 (*)    MCV 77.5 (*)    MCH 25.9 (*)    Neutro Abs 12.2 (*)    Monocytes Absolute 1.2 (*)    All other components within normal limits  COMPREHENSIVE METABOLIC PANEL - Abnormal; Notable for the following:    Sodium 132 (*)    Chloride 98 (*)    Glucose, Bld 224 (*)    Creatinine, Ser 1.77 (*)    Calcium 8.7 (*)    Albumin 3.0 (*)    ALT 16 (*)    Alkaline Phosphatase 195 (*)    GFR calc non Af Amer 39 (*)    GFR calc Af Amer 45  (*)    All other components within normal limits  CULTURE, BLOOD (ROUTINE X 2)  CULTURE, BLOOD (ROUTINE X 2)    EKG  EKG Interpretation None       Radiology Dg Foot Complete Right  Result Date: 06/20/2016 CLINICAL  DATA:  Infected forefoot. EXAM: RIGHT FOOT COMPLETE - 3+ VIEW COMPARISON:  None. FINDINGS: Amputations of the first and second rays at the level of the first tarsometatarsal articulation and at the second distal metatarsal shaft. There is new bone loss at the distal tuft of the third distal phalanx. There is soft tissue swelling and probable soft tissue gas in the third phalanx. The findings are suspicious for osteomyelitis. IMPRESSION: Probable osteomyelitis of the third digit. Electronically Signed   By: Andreas Newport M.D.   On: 06/20/2016 23:52    Procedures Procedures (including critical care time)  Medications Ordered in ED Medications  sodium chloride 0.9 % bolus 1,000 mL (not administered)  vancomycin (VANCOCIN) 1,500 mg in sodium chloride 0.9 % 500 mL IVPB (not administered)  cefTRIAXone (ROCEPHIN) 2 g in dextrose 5 % 50 mL IVPB (not administered)     Initial Impression / Assessment and Plan / ED Course  I have reviewed the triage vital signs and the nursing notes.  Pertinent labs & imaging results that were available during my care of the patient were reviewed by me and considered in my medical decision making (see chart for details).  Clinical Course    Patient presents to the ED for worsening toe pain and redness.  There is swelling and warmth as well, concerning for cellulitis.  XR reveals osteo as well. I spoke with ortho on call for Dr. Sharol Given who recs to admit to hospitalist and they will consult for likely surgery on Friday.  Hospitalist paged for admission.  Cultures obtained. He wasgiven ceftriaxone and vancomycin.  Will continue to closely monitor.    Final Clinical Impressions(s) / ED Diagnoses   Final diagnoses:  None    New  Prescriptions New Prescriptions   No medications on file    I personally performed the services described in this documentation, which was scribed in my presence. The recorded information has been reviewed and is accurate.      Everlene Balls, MD 06/21/16 [phone]

## 2016-06-21 NOTE — H&P (Signed)
History and Physical  Patient Name: William [name]     [id number]    DOB: Jun 29, 1951    DOA: 06/21/2016 PCP: Philis Fendt, MD   Patient coming from: Home  Chief Complaint: Foot pain, redness/swelling, discharge  HPI: William [name] is a 65 y.o. male with a past medical history significant for IDDM, HTN, PVD and DFU c/b osteomyelitis requiring right 1st/2nd toe amputation, recent STEMI c/b LV (EF 40-45%) and RV failure and cardiogenic shock in setting of VSD, treated with CABG and VSD repair, c/b post-op Afib now on warfarin who presents with toe pain, redness, discharge and swelling.  The patient had recent complicated admission in Sept for STEMI, now s/p CABG, right 1st and 2nd toe amputations, followed by CHF service, on warfarin for Afib.    Since his amputation he was doing well, but had been noticing dry gangrene of his 3rd toe, followed by his Orthopedist.  Last week, he developed an upper respiratory infection with fever, cough, congestion for a few days, that resolved by itself. However as this was resolving, he noticed that he was starting to have more pain, bloody discharge, and swelling around his right third toe. Then in the last 24 hours, the pain got severe, he had fever to 101F at home, generalized malaise, increased discharge, decreased appetite, and hyperglycemia and this continued to get worse so he came to the ER.  ED course: -Temp 99.7F, heart rate 96, respirations pulse ox normal, blood pressure 107/63 -Na 132, K 3.7, Cr 1.77 (baseline 1.4), WBC 15.8K, Hgb 10.6 (recent baseline in 10s) -X-ray of the right foot showed destruction of the distal phalanx -Cultures were obtained, and the case was discussed with Dr. Sharol Given who recommended antibiotics and hospitalist admission, probably surgery on Friday, per EDP    The patient has diabetes, HTN, and was a smoker but was otherwise stable until Sept of this year when he was being worked up for intermittent claudication and  diabetic foot ulcer and developed a STEMI.  He was found at that time also to have a VSD, and developed cardiogenic shock on the catheterization table.  He subsequently underwent CABG with VSD repair.  This was complicated by AKI and pAF, now on warfarin.  Found to have systolic dysfunction with EF 40-45%.  Post-operative course also complicated by some hypotension requiring midodrine and preventing ACEi use, only cautious BB use.  Renal function has been worsening recently, with recent baseline around 1.4.          ROS: Review of Systems  Constitutional: Positive for fever and malaise/fatigue.  Respiratory: Negative for cough and sputum production.   Cardiovascular: Negative for chest pain, orthopnea, claudication and leg swelling.  Musculoskeletal: Positive for neck pain (chronic).       Foot pain and bleeding  All other systems reviewed and are negative.         Past Medical History:  Diagnosis Date  . AKI (acute kidney injury) ([place])    With STEMI in 2017  . Chronic systolic CHF (congestive heart failure) ([place])   . Diabetes mellitus without complication ([place])   . Hyperlipidemia   . Hypertension   . Paroxysmal atrial fibrillation ([name])   . STEMI (ST elevation myocardial infarction) ([place]) 2017    Past Surgical History:  Procedure Laterality Date  . AMPUTATION Right 03/23/2016   Procedure: 1st and 2nd Ray Amputation Right Foot;  Surgeon: Newt Minion, MD;  Location: [facility];  Service: Orthopedics;  Laterality: Right;  .  CARDIAC CATHETERIZATION N/A 03/13/2016   Procedure: Right/Left Heart Cath and Coronary Angiography;  Surgeon: Sherren Mocha, MD;  Location: [facility];  Service: Cardiovascular;  Laterality: N/A;  . CARDIAC CATHETERIZATION N/A 03/13/2016   Procedure: IABP Insertion;  Surgeon: Sherren Mocha, MD;  Location: [facility];  Service: Cardiovascular;  Laterality: N/A;  . CORONARY ARTERY BYPASS GRAFT N/A 03/13/2016   Procedure: CORONARY ARTERY BYPASS  GRAFTING (CABG) x 1 (SVG to OM) with EVH from [facility];  Surgeon: Ivin Poot, MD;  Location: [facility];  Service: Open Heart Surgery;  Laterality: N/A;  . LOWER EXTREMITY ANGIOGRAM  05/02/2016   Procedure: Lower Extremity Angiogram;  Surgeon: Wellington Hampshire, MD;  Location: [facility];  Service: Cardiovascular;;  Limited left femoral runoff right femoral runoff  . PERIPHERAL VASCULAR CATHETERIZATION N/A 05/02/2016   Procedure: Abdominal Aortogram;  Surgeon: Wellington Hampshire, MD;  Location: [facility];  Service: Cardiovascular;  Laterality: N/A;  . PERIPHERAL VASCULAR CATHETERIZATION Right 05/02/2016   Procedure: Peripheral Vascular Balloon Angioplasty;  Surgeon: Wellington Hampshire, MD;  Location: [facility];  Service: Cardiovascular;  Laterality: Right;  SFA  . TEE WITHOUT CARDIOVERSION N/A 03/13/2016   Procedure: TRANSESOPHAGEAL ECHOCARDIOGRAM (TEE);  Surgeon: Ivin Poot, MD;  Location: [facility];  Service: Open Heart Surgery;  Laterality: N/A;  . VSD REPAIR N/A 03/13/2016   Procedure: VENTRICULAR SEPTAL DEFECT (VSD) REPAIR;  Surgeon: Ivin Poot, MD;  Location: [facility];  Service: Open Heart Surgery;  Laterality: N/A;    Social History: Patient lives with his wife, daughter, and grandchildren.  The patient is wheelchair bound at present.  He is from [place].  Worked many jobs, most recently a Chief Strategy Officer.  Former smoker, quit when he had his STEMI.  No alcohol.    Allergies  Allergen Reactions  . Morphine And Related Other (See Comments)    UNSPECIFIED REACTION "Pt said it was too much"     Family history: family history includes Aneurysm in his mother; Coronary artery disease in his mother; Diabetes in his maternal grandmother and mother; Peptic Ulcer in his father; Peripheral Artery Disease in his mother.  Prior to Admission medications   Medication Sig Start Date End Date Taking? Authorizing Provider  aspirin EC 81 MG tablet Take 81 mg by  mouth daily.    Historical Provider, MD  carvedilol (COREG) 3.125 MG tablet Take 1 tablet (3.125 mg total) by mouth 2 (two) times daily. 05/28/16 08/26/16  [name] Friar, PA-C  digoxin (LANOXIN) 0.125 MG tablet Take 1 tablet (0.125 mg total) by mouth daily. 05/28/16   [name] Friar, PA-C  docusate sodium (COLACE) 100 MG capsule Take 100 mg by mouth 2 (two) times daily.    Historical Provider, MD  gabapentin (NEURONTIN) 300 MG capsule Take 300 mg by mouth daily.     Historical Provider, MD  insulin detemir (LEVEMIR) 100 UNIT/ML injection Inject 40-50 Units into the skin 2 (two) times daily. Take 50 every morning  Take 40 units every evening    Historical Provider, MD  midodrine (PROAMATINE) 5 MG tablet Take 1 tablet (5 mg total) by mouth 3 (three) times daily with meals. 05/28/16   [name] Friar, PA-C  nitroGLYCERIN (NITRODUR - DOSED IN MG/24 HR) 0.4 mg/hr patch Place 1 patch (0.4 mg total) onto the skin daily. 05/22/16   Newt Minion, MD  oxyCODONE-acetaminophen (PERCOCET/ROXICET) 5-325 MG tablet Take 1 tablet by mouth every  6 (six) hours as needed for severe pain. 05/22/16   Newt Minion, MD  pantoprazole (PROTONIX) 40 MG tablet Take 40 mg by mouth daily.    Historical Provider, MD  pramipexole (MIRAPEX) 0.125 MG tablet Take 0.125 mg by mouth at bedtime.    Historical Provider, MD  rosuvastatin (CRESTOR) 40 MG tablet Take 1 tablet (40 mg total) by mouth daily. 04/02/16   Donielle Liston Alba, PA-C  silver sulfADIAZINE (SILVADENE) 1 % cream Apply 1 application topically daily. 06/13/16   Chrystie Nose, NP  spironolactone (ALDACTONE) 25 MG tablet Take 1 tablet (25 mg total) by mouth daily. 05/28/16   [name] Friar, PA-C  torsemide (DEMADEX) 20 MG tablet Take 1 tablet (20 mg total) by mouth 3 (three) times a week. Every Mon, Wed and Fri 05/28/16   [name] Friar, PA-C  warfarin (COUMADIN) 5 MG tablet Take 1-1.5 tablets (5-7.5 mg total) by mouth daily.  Take 5 mg (1 tab) daily except 7.5 mg (1 and 1/2 tabs) on Monday, Wednesday and Friday 05/28/16   [name] Friar, PA-C       Physical Exam: BP 109/72   Pulse 101   Temp 99.5 F (37.5 C) (Oral)   Resp 18   Wt 66.2 kg (146 lb)   SpO2 100%   BMI 24.30 kg/m  General appearance: Elderly adult male, alert and in no acute distress, tired.   Eyes: Anicteric, conjunctiva pink, lids and lashes normal. PERRL.    ENT: No nasal deformity, discharge, epistaxis.  Hearing normal. OP moist without lesions.  Dentures. Neck: No neck masses.  Trachea midline.  No thyromegaly/tenderness. Lymph: No cervical or supraclavicular lymphadenopathy. Skin: Warm and dry.  No jaundice.  The right third toe is black and has redness around it.  The left great toe is slightly red.   Cardiac: Tachycardic, regular, nl S1-S2, no murmurs appreciated.  Capillary refill is brisk.  JVP normal.  No LE edema.  Radial pulses 2+ and symmetric.  Feet warm but DP pulses diminihsed. Respiratory: Normal respiratory rate and rhythm.  CTAB without rales or wheezes. Abdomen: Abdomen soft.  No TTP. No ascites, distension, hepatosplenomegaly.   MSK: No deformities or effusions.  No cyanosis or clubbing. Neuro: Cranial nerves 3-12 intact.  Sensation intact to light touch. Speech is fluent.  Muscle strength symmetric.    Psych: Sensorium intact and responding to questions, attention normal.  Behavior appropriate.  Affect normal.  Judgment and insight appear normal.     Labs on Admission:  I have personally reviewed following labs and imaging studies: CBC:  Recent Labs Lab 06/20/16 2329  WBC 15.8*  NEUTROABS 12.2*  HGB 10.6*  HCT 31.7*  MCV 77.5*  PLT [id number]   Basic Metabolic Panel:  Recent Labs Lab 06/20/16 2329  NA 132*  K 3.7  CL 98*  CO2 24  GLUCOSE 224*  BUN 19  CREATININE 1.77*  CALCIUM 8.7*   GFR: Estimated Creatinine Clearance: 36.2 mL/min (by C-G formula based on SCr of 1.77 mg/dL (H)).  Liver  Function Tests:  Recent Labs Lab 06/20/16 2329  AST 22  ALT 16*  ALKPHOS 195*  BILITOT 0.3  PROT 6.9  ALBUMIN 3.0*   No results for input(s): LIPASE, AMYLASE in the last 168 hours. No results for input(s): AMMONIA in the last 168 hours. Coagulation Profile:  Recent Labs Lab 06/21/16 0235  INR 1.89   Cardiac Enzymes: No results for input(s): CKTOTAL, CKMB, CKMBINDEX, TROPONINI in the last 168 hours.  BNP (last 3 results) No results for input(s): PROBNP in the last 8760 hours. HbA1C: No results for input(s): HGBA1C in the last 72 hours. CBG: No results for input(s): GLUCAP in the last 168 hours. Lipid Profile: No results for input(s): CHOL, HDL, LDLCALC, TRIG, CHOLHDL, LDLDIRECT in the last 72 hours. Thyroid Function Tests: No results for input(s): TSH, T4TOTAL, FREET4, T3FREE, THYROIDAB in the last 72 hours. Anemia Panel: No results for input(s): VITAMINB12, FOLATE, FERRITIN, TIBC, IRON, RETICCTPCT in the last 72 hours. Sepsis Labs: Lactate pending Invalid input(s): PROCALCITONIN, LACTICIDVEN No results found for this or any previous visit (from the past 240 hour(s)).       Radiological Exams on Admission: Personally reviewed radiograph report: Dg Foot Complete Right  Result Date: 06/20/2016 CLINICAL DATA:  Infected forefoot. EXAM: RIGHT FOOT COMPLETE - 3+ VIEW COMPARISON:  None. FINDINGS: Amputations of the first and second rays at the level of the first tarsometatarsal articulation and at the second distal metatarsal shaft. There is new bone loss at the distal tuft of the third distal phalanx. There is soft tissue swelling and probable soft tissue gas in the third phalanx. The findings are suspicious for osteomyelitis. IMPRESSION: Probable osteomyelitis of the third digit. Electronically Signed   By: Andreas Newport M.D.   On: 06/20/2016 23:52   Echocardiogram Sep 2017: EF 40-45% Grade 2 DD  ABI November 2017: R 1.0 L 0.84        Assessment/Plan  1.  Suspected sepsis from osteomyelitis/cellulitis in diabetic foot ulcer:  Suspected source osteomyelitis, no evidence for CAP/UTI. Organism unknown.   Patient meets criteria given tachycardia, fever, leukocytosis, and evidence of organ dysfunction (AKI).   Lactate pending.  Given patient tachycardia, malaise, soft BP, feel uncomfortable holding antibiotics until surgery.  Antibiotics delivered in the ED.    -Sepsis bundle utilized:  -Blood cultures drawn  -Start targeted antibiotics with vancomycin, ceftriaxone and Flagyl, based on suspected source of infection    -Repeat renal function and complete blood count in AM  -[name] had recent ABIs    2. Hyponatremia:  Mild.  In setting of sepsis. -Fluids given -Trend BMP  3. AKI:  Renal function affected recently by STEMI/new CHF, but previously baseline ~0.9, more recently ~1.4. -Fluids given in ER -Hold torsemide one day and trend BMP  4. IDDM:  -Continue Levemir -SSI with meals  5. Chronic systolic CHF and ischemic CM and secondary prevention:  EF 40-45%. -Hold torsemide and spironolactone for 1 day, monitor BMP -Daily weights, I/Os -Continue statin, aspirin, BB with hold parameters  6. Paroxysmal atrial fibrillation:  CHADS2Vasc 5.  On warfarin and carvedilol. -Continue warfarin -Continue carvedilol with hold parameters < 123XX123 systolic -Continue digoxin  7. Hypotension:  -Continue midodrine  8. Anemia: Near recent baseline 10  9. Other medications -Continue gabapentin -Continue PPI -Continue pramipexole         DVT prophylaxis: N/A, on warfarin  Code Status: FULL  Family Communication: Daughter at bedside  Disposition Plan: Anticipate IV antibiotics and monitor hemodynamics.  Orthopedics will see today, anticipate to OR for amputation on Friday. Consults called: Orthopedics Admission status: INPATIENT    Medical decision making: Patient seen at 3:03 AM on 06/21/2016.  The  patient was discussed with Dr. Claudine Mouton.  What exists of the patient's chart was reviewed in depth and summarized above.  Clinical condition: soft BP and mild tachycardia, but stable, improving, mentating well, stable for floor.  Edwin Dada [facility] Pager [phone]       At the time of admission, it appears that the appropriate admission status for this patient is INPATIENT. This is judged to be reasonable and necessary in order to provide the required intensity of service to ensure the patient's safety given the presenting symptoms, physical exam findings, and initial radiographic and laboratory data in the context of their chronic comorbidities.  Together, these circumstances are felt to place him at high risk for further clinical deterioration threatening life, limb, or organ.   Patient requires inpatient status due to high intensity of service, high risk for further deterioration and high frequency of surveillance required because of this acute illness that poses a threat to life, limb or bodily function.  I certify that at the point of admission it is my clinical judgment that the patient will require inpatient hospital care spanning beyond 2 midnights from the point of admission and that early discharge would result in unnecessary risk of decompensation and readmission or threat to life, limb or bodily function.

## 2016-06-21 NOTE — ED Notes (Signed)
Con Memos, daughter [phone].

## 2016-06-21 NOTE — Anesthesia Preprocedure Evaluation (Addendum)
Anesthesia Evaluation  Patient identified by MRN, date of birth, ID band Patient awake    Reviewed: Allergy & Precautions, NPO status , Patient's Chart, lab work & pertinent test results  History of Anesthesia Complications Negative for: history of anesthetic complications  Airway Mallampati: II  TM Distance: >3 FB Neck ROM: Full    Dental  (+) Edentulous Upper, Edentulous Lower   Pulmonary former smoker,    breath sounds clear to auscultation       Cardiovascular hypertension, Pt. on medications + CAD, + Past MI, + Peripheral Vascular Disease and +CHF   Rhythm:Regular     Neuro/Psych PSYCHIATRIC DISORDERS negative neurological ROS     GI/Hepatic GERD  ,  Endo/Other  diabetes, Type 2, Insulin Dependent  Renal/GU CRFRenal disease     Musculoskeletal   Abdominal   Peds  Hematology  (+) anemia ,   Anesthesia Other Findings Left ventricle: The cavity size was normal. Systolic function was   mildly to moderately reduced. The estimated ejection fraction was   in the range of 40% to 45%. Images were inadequate for LV wall   motion assessment although there appears to be at least mid and   apical inferoseptal akinesis but endocardial segments not   adequately visualized to assess other wall. Features are   consistent with a pseudonormal left ventricular filling pattern,   with concomitant abnormal relaxation and increased filling   pressure (grade 2 diastolic dysfunction). Doppler parameters are   consistent with high ventricular filling pressure.   Reproductive/Obstetrics                            Anesthesia Physical Anesthesia Plan  ASA: III  Anesthesia Plan: Regional   Post-op Pain Management:    Induction:   Airway Management Planned: Natural Airway, Nasal Cannula and Simple Face Mask  Additional Equipment:   Intra-op Plan:   Post-operative Plan:   Informed Consent: I have  reviewed the patients History and Physical, chart, labs and discussed the procedure including the risks, benefits and alternatives for the proposed anesthesia with the patient or authorized representative who has indicated his/her understanding and acceptance.   Dental advisory given  Plan Discussed with: CRNA and Surgeon  Anesthesia Plan Comments:         Anesthesia Quick Evaluation

## 2016-06-21 NOTE — Transfer of Care (Signed)
Immediate Anesthesia Transfer of Care Note  Patient: William [name]  Procedure(s) Performed: Procedure(s): RIGHT TRANSMETATARSAL AMPUTATION (Right)  Patient Location: PACU  Anesthesia Type:MAC and Regional  Level of Consciousness: awake, alert , oriented and patient cooperative  Airway & Oxygen Therapy: Patient Spontanous Breathing  Post-op Assessment: Report given to RN, Post -op Vital signs reviewed and stable and Patient moving all extremities X 4  Post vital signs: Reviewed and stable  Last Vitals:  Vitals:   06/21/16 0900 06/21/16 1500  BP: (!) 115/58 (!) 95/55  Pulse: 81 (!) 102  Resp:    Temp: 36.3 C 37.3 C    Last Pain:  Vitals:   06/21/16 1500  TempSrc: Oral  PainSc:       Patients Stated Pain Goal: 2 ([id number])  Complications: No apparent anesthesia complications

## 2016-06-21 NOTE — Consult Note (Signed)
WOC consulted for right DM foot ulcerations.  However it is noted that orthopedics has also been consulted and is planning a right transmetatarsal amputation.  WOC will not consult for that reason. Dry dressings until surgical intervention.  Discussed POC with patient and bedside nurse.  Re consult if needed, will not follow at this time. Thanks  Raylyn Speckman [employer], RN,CWOCN, CNS [phone])

## 2016-06-21 NOTE — Progress Notes (Signed)
Patient ID: William [name], male   DOB: [birth year], 65 y.o.   MRN: [id number] Healthy viable tissue at level transmetatarsal amputation. Patient may discontinue IV antibiotics 48 hours after surgery and may discharge to home once he is able to ambulate nonweightbearing on the right. I will follow-up in the office in 2 weeks.

## 2016-06-21 NOTE — Anesthesia Postprocedure Evaluation (Signed)
Anesthesia Post Note  Patient: William [name]  Procedure(s) Performed: Procedure(s) (LRB): RIGHT TRANSMETATARSAL AMPUTATION (Right)  Patient location during evaluation: PACU Anesthesia Type: Regional Level of consciousness: awake and alert Pain management: pain level controlled Vital Signs Assessment: post-procedure vital signs reviewed and stable Respiratory status: spontaneous breathing, nonlabored ventilation, respiratory function stable and patient connected to nasal cannula oxygen Cardiovascular status: blood pressure returned to baseline and stable Postop Assessment: no signs of nausea or vomiting Anesthetic complications: no       Last Vitals:  Vitals:   06/21/16 1945 06/21/16 2000  BP:  (!) 95/32  Pulse: 99 96  Resp: (!) 21 19  Temp:  36.7 C    Last Pain:  Vitals:   06/21/16 1500  TempSrc: Oral  PainSc:                  [name]

## 2016-06-22 ENCOUNTER — Encounter (HOSPITAL_COMMUNITY): Payer: Self-pay | Admitting: Orthopedic Surgery

## 2016-06-22 DIAGNOSIS — I2510 Atherosclerotic heart disease of native coronary artery without angina pectoris: Secondary | ICD-10-CM

## 2016-06-22 DIAGNOSIS — Z794 Long term (current) use of insulin: Secondary | ICD-10-CM

## 2016-06-22 DIAGNOSIS — I96 Gangrene, not elsewhere classified: Secondary | ICD-10-CM

## 2016-06-22 DIAGNOSIS — I10 Essential (primary) hypertension: Secondary | ICD-10-CM

## 2016-06-22 DIAGNOSIS — M86171 Other acute osteomyelitis, right ankle and foot: Secondary | ICD-10-CM

## 2016-06-22 DIAGNOSIS — E1152 Type 2 diabetes mellitus with diabetic peripheral angiopathy with gangrene: Secondary | ICD-10-CM

## 2016-06-22 DIAGNOSIS — I5022 Chronic systolic (congestive) heart failure: Secondary | ICD-10-CM

## 2016-06-22 DIAGNOSIS — I480 Paroxysmal atrial fibrillation: Secondary | ICD-10-CM

## 2016-06-22 DIAGNOSIS — A419 Sepsis, unspecified organism: Principal | ICD-10-CM

## 2016-06-22 DIAGNOSIS — N179 Acute kidney failure, unspecified: Secondary | ICD-10-CM

## 2016-06-22 DIAGNOSIS — I2583 Coronary atherosclerosis due to lipid rich plaque: Secondary | ICD-10-CM

## 2016-06-22 DIAGNOSIS — E871 Hypo-osmolality and hyponatremia: Secondary | ICD-10-CM

## 2016-06-22 LAB — CBC
HCT: 28.6 % — ABNORMAL LOW (ref 39.0–52.0)
Hemoglobin: 9.5 g/dL — ABNORMAL LOW (ref 13.0–17.0)
MCH: 25.8 pg — AB (ref 26.0–34.0)
MCHC: 33.2 g/dL (ref 30.0–36.0)
MCV: 77.7 fL — ABNORMAL LOW (ref 78.0–100.0)
PLATELETS: 286 10*3/uL (ref 150–400)
RBC: 3.68 MIL/uL — AB (ref 4.22–5.81)
RDW: 15.6 % — AB (ref 11.5–15.5)
WBC: 11.8 10*3/uL — ABNORMAL HIGH (ref 4.0–10.5)

## 2016-06-22 LAB — COMPREHENSIVE METABOLIC PANEL
ALK PHOS: 179 U/L — AB (ref 38–126)
ALT: 16 U/L — ABNORMAL LOW (ref 17–63)
ANION GAP: 9 (ref 5–15)
AST: 26 U/L (ref 15–41)
Albumin: 2.5 g/dL — ABNORMAL LOW (ref 3.5–5.0)
BILIRUBIN TOTAL: 0.5 mg/dL (ref 0.3–1.2)
BUN: 14 mg/dL (ref 6–20)
CALCIUM: 8.6 mg/dL — AB (ref 8.9–10.3)
CO2: 24 mmol/L (ref 22–32)
Chloride: 102 mmol/L (ref 101–111)
Creatinine, Ser: 1.74 mg/dL — ABNORMAL HIGH (ref 0.61–1.24)
GFR, EST AFRICAN AMERICAN: 46 mL/min — AB (ref >60)
GFR, EST NON AFRICAN AMERICAN: 39 mL/min — AB (ref >60)
Glucose, Bld: 181 mg/dL — ABNORMAL HIGH (ref 65–99)
POTASSIUM: 3.3 mmol/L — AB (ref 3.5–5.1)
Sodium: 135 mmol/L (ref 135–145)
TOTAL PROTEIN: 6.1 g/dL — AB (ref 6.5–8.1)

## 2016-06-22 LAB — MAGNESIUM: Magnesium: 2.3 mg/dL (ref 1.7–2.4)

## 2016-06-22 LAB — GLUCOSE, CAPILLARY
GLUCOSE-CAPILLARY: 244 mg/dL — AB (ref 65–99)
GLUCOSE-CAPILLARY: 166 mg/dL — AB (ref 65–99)
GLUCOSE-CAPILLARY: 239 mg/dL — AB (ref 65–99)
Glucose-Capillary: 164 mg/dL — ABNORMAL HIGH (ref 65–99)
Glucose-Capillary: 215 mg/dL — ABNORMAL HIGH (ref 65–99)
Glucose-Capillary: 268 mg/dL — ABNORMAL HIGH (ref 65–99)

## 2016-06-22 LAB — PROTIME-INR
INR: 2.02
PROTHROMBIN TIME: 23.2 s — AB (ref 11.4–15.2)

## 2016-06-22 MED ORDER — WARFARIN SODIUM 5 MG PO TABS
7.5000 mg | ORAL_TABLET | ORAL | Status: AC
  Administered 2016-06-22: 7.5 mg via ORAL
  Filled 2016-06-22: qty 2

## 2016-06-22 MED ORDER — POTASSIUM CHLORIDE CRYS ER 20 MEQ PO TBCR
40.0000 meq | EXTENDED_RELEASE_TABLET | ORAL | Status: AC
  Administered 2016-06-22 (×2): 40 meq via ORAL
  Filled 2016-06-22 (×2): qty 2

## 2016-06-22 MED ORDER — WARFARIN SODIUM 5 MG PO TABS: 5.0000 mg | ORAL_TABLET | ORAL | Status: DC

## 2016-06-22 MED ORDER — INSULIN DETEMIR 100 UNIT/ML ~~LOC~~ SOLN
19.0000 [IU] | Freq: Every day | SUBCUTANEOUS | Status: DC
  Administered 2016-06-22: 19 [IU] via SUBCUTANEOUS
  Filled 2016-06-22 (×3): qty 0.19

## 2016-06-22 MED ORDER — INSULIN ASPART 100 UNIT/ML ~~LOC~~ SOLN
0.0000 [IU] | Freq: Three times a day (TID) | SUBCUTANEOUS | Status: DC
  Administered 2016-06-22 (×2): 3 [IU] via SUBCUTANEOUS
  Administered 2016-06-22 – 2016-06-23 (×2): 2 [IU] via SUBCUTANEOUS

## 2016-06-22 NOTE — Progress Notes (Signed)
Inpatient Diabetes Program Recommendations  AACE/ADA: New Consensus Statement on Inpatient Glycemic Control (2015)  Target Ranges:  Prepandial:   less than 140 mg/dL      Peak postprandial:   less than 180 mg/dL (1-2 hours)      Critically ill patients:  140 - 180 mg/dL   Lab Results  Component Value Date   GLUCAP 244 (H) 06/22/2016   HGBA1C 10.7 (H) 03/13/2016    Review of Glycemic Control:  Results for William [name], William [name] (MRN [id number]) as of 06/22/2016 13:30  Ref. Range 06/21/2016 20:57 06/22/2016 00:17 06/22/2016 04:28 06/22/2016 07:39 06/22/2016 12:00  Glucose-Capillary Latest Ref Range: 65 - 99 mg/dL 112 (H) 268 (H) 164 (H) 215 (H) 244 (H)   Diabetes history: Type 2 diabetes Outpatient Diabetes medications: Levemir 50 units q AM and 40 units q Pm Current orders for Inpatient glycemic control:  Levemir 15 units q HS, Novolog Sensitive tid with meals Inpatient Diabetes Program Recommendations:    Note that patient was on a total of 90 units of Levemir daily prior to admit.   May consider increasing Levemir to 30 units daily and increase Novolog correction to moderate.    Thanks, Adah Perl, RN, BC-ADM Inpatient Diabetes Coordinator Pager [phone] (8a-5p)

## 2016-06-22 NOTE — Care Management Note (Signed)
Case Management Note  Patient Details  Name: William [name] MRN: [id number] Date of Birth: 1950/10/29  Subjective/Objective:                    Action/Plan:  Confirmed face sheet information with patient. Expected Discharge Date:  06/21/16               Expected Discharge Plan:  [place]  In-House Referral:     Discharge planning Services  CM Consult  Post Acute Care Choice:  Durable Medical Equipment, Home Health Choice offered to:  Patient  DME Arranged:    DME Agency:     HH Arranged:  PT [place]:  [facility]  Status of Service:  Completed, signed off  If discussed at [place] of Stay Meetings, dates discussed:    Additional Comments:  Marilu Favre, RN 06/22/2016, 2:06 PM

## 2016-06-22 NOTE — Progress Notes (Signed)
PROGRESS NOTE    William [name]  [id number] DOB: Aug 06, 1950 DOA: 06/21/2016  PCP: Philis Fendt, MD   Brief Narrative:  William [name] is a 65 y.o. male with a past medical history significant for IDDM, HTN, PVD and DFU c/b osteomyelitis requiring right 1st/2nd toe amputation, recent STEMI c/b LV (EF 40-45%) and RV failure and cardiogenic shock in setting of VSD, treated with CABG and VSD repair, c/b post-op Afib now on warfarin who presents with toe pain, redness, discharge and swelling.  The patient had recent complicated admission in Sept for STEMI, now s/p CABG, right 1st and 2nd toe amputations, followed by CHF service, on warfarin for Afib.    Since his amputation he was doing well, but had been noticing dry gangrene of his 3rd toe, followed by his Orthopedist.  Last week, he developed an upper respiratory infection with fever, cough, congestion for a few days, that resolved by itself. However as this was resolving, he noticed that he was starting to have more pain, bloody discharge, and swelling around his right third toe. Then in the last 24 hours, the pain got severe, he had fever to 101F at home, generalized malaise, increased discharge, decreased appetite, and hyperglycemia and this continued to get worse so he came to the ER.  Subjective: No complaints. Refusing to walk until "his doctor" tells him it's okay.  Assessment & Plan:   Principal Problem:   Sepsis, unspecified organism/osteomyelitis/cellulitis in diabetic foot ulcer -Status post transmetatarsal amputation today-continue antibiotics for now -Dressings  per ortho  Active Problems:   Type 2 diabetes mellitus with diabetic peripheral angiopathy and gangrene, with long-term current use of insulin - Continue Levemir and sliding scale insulin - sugars elevated, increase Levemir to 19 units today    Paroxysmal atrial fibrillation -Digoxin, carvedilol, warfarin   Recent and STEMI - Statin, aspirin- on a beta  blocker    Chronic systolic HF (heart failure)- EF 40-45% - Aldactone and Demadex on hold for today - Follow fluid status carefully    AKI (acute kidney injury) ([place]) - Follow for improvement in creatinine-diuretics being held   DVT prophylaxis: Coumadin Code Status: Full code Family Communication:  Disposition Plan:  Consultants:   Ortho Procedures:   Transmetatarsal amputation 12/21 Antimicrobials:  Anti-infectives    Start     Dose/Rate Route Frequency Ordered Stop   06/22/16 0600  vancomycin (VANCOCIN) IVPB 1000 mg/200 mL premix     1,000 mg 200 mL/hr over 60 Minutes Intravenous Every 24 hours 06/21/16 0350     06/22/16 0200  cefTRIAXone (ROCEPHIN) 2 g in dextrose 5 % 50 mL IVPB  Status:  Discontinued     2 g 100 mL/hr over 30 Minutes Intravenous Every 24 hours 06/21/16 0336 06/21/16 1027   06/21/16 1400  piperacillin-tazobactam (ZOSYN) IVPB 3.375 g     3.375 g 12.5 mL/hr over 240 Minutes Intravenous Every 8 hours 06/21/16 1035     06/21/16 0600  metroNIDAZOLE (FLAGYL) tablet 500 mg  Status:  Discontinued     500 mg Oral Every 8 hours 06/21/16 0336 06/21/16 1027   06/21/16 0200  vancomycin (VANCOCIN) 1,500 mg in sodium chloride 0.9 % 500 mL IVPB     1,500 mg 250 mL/hr over 120 Minutes Intravenous  Once 06/21/16 0152 06/21/16 0527   06/21/16 0200  cefTRIAXone (ROCEPHIN) 2 g in dextrose 5 % 50 mL IVPB     2 g 100 mL/hr over 30 Minutes Intravenous  Once 06/21/16 0152 06/21/16 0302  Objective: Vitals:   06/22/16 0100 06/22/16 0528 06/22/16 0958 06/22/16 1424  BP: (!) 103/58 93/76 (!) 106/57 112/60  Pulse: 96 (!) 124 94 94  Resp: 18 18  17   Temp: 99.7 F (37.6 C) 99.5 F (37.5 C) 99.2 F (37.3 C) 98.8 F (37.1 C)  TempSrc: Oral Oral Oral Oral  SpO2: 96% 98% 98% 100%  Weight:      Height:        Intake/Output Summary (Last 24 hours) at 06/22/16 1652 Last data filed at 06/22/16 1639  Gross per 24 hour  Intake             1906 ml  Output              3002 ml  Net            -1096 ml   Filed Weights   06/20/16 2309 06/21/16 0420  Weight: 66.2 kg (146 lb) 67.6 kg (149 lb)    Examination: General exam: Appears comfortable  HEENT: PERRLA, oral mucosa moist, no sclera icterus or thrush Respiratory system: Clear to auscultation. Respiratory effort normal. Cardiovascular system: S1 & S2 heard, RRR.  No murmurs  Gastrointestinal system: Abdomen soft, non-tender, nondistended. Normal bowel sound. No organomegaly Central nervous system: Alert and oriented. No focal neurological deficits. Extremities: No cyanosis, clubbing or edema- right foot in dressing Skin: No rashes or ulcers Psychiatry:  Mood & affect appropriate.     Data Reviewed: I have personally reviewed following labs and imaging studies  CBC:  Recent Labs Lab 06/20/16 2329 06/22/16 0403  WBC 15.8* 11.8*  NEUTROABS 12.2*  --   HGB 10.6* 9.5*  HCT 31.7* 28.6*  MCV 77.5* 77.7*  PLT 294 [id number]   Basic Metabolic Panel:  Recent Labs Lab 06/20/16 2329 06/22/16 0403 06/22/16 1033  NA 132* 135  --   K 3.7 3.3*  --   CL 98* 102  --   CO2 24 24  --   GLUCOSE 224* 181*  --   BUN 19 14  --   CREATININE 1.77* 1.74*  --   CALCIUM 8.7* 8.6*  --   MG  --   --  2.3   GFR: Estimated Creatinine Clearance: 36.8 mL/min (by C-G formula based on SCr of 1.74 mg/dL (H)). Liver Function Tests:  Recent Labs Lab 06/20/16 2329 06/22/16 0403  AST 22 26  ALT 16* 16*  ALKPHOS 195* 179*  BILITOT 0.3 0.5  PROT 6.9 6.1*  ALBUMIN 3.0* 2.5*   No results for input(s): LIPASE, AMYLASE in the last 168 hours. No results for input(s): AMMONIA in the last 168 hours. Coagulation Profile:  Recent Labs Lab 06/21/16 0235 06/22/16 0403  INR 1.89 2.02   Cardiac Enzymes: No results for input(s): CKTOTAL, CKMB, CKMBINDEX, TROPONINI in the last 168 hours. BNP (last 3 results) No results for input(s): PROBNP in the last 8760 hours. HbA1C: No results for input(s): HGBA1C in the last 72  hours. CBG:  Recent Labs Lab 06/22/16 0017 06/22/16 0428 06/22/16 0739 06/22/16 1200 06/22/16 1633  GLUCAP 268* 164* 215* 244* 166*   Lipid Profile: No results for input(s): CHOL, HDL, LDLCALC, TRIG, CHOLHDL, LDLDIRECT in the last 72 hours. Thyroid Function Tests: No results for input(s): TSH, T4TOTAL, FREET4, T3FREE, THYROIDAB in the last 72 hours. Anemia Panel: No results for input(s): VITAMINB12, FOLATE, FERRITIN, TIBC, IRON, RETICCTPCT in the last 72 hours. Urine analysis: No results found for: COLORURINE, APPEARANCEUR, [place], [place], [place], [place], [place], [place], [place], [place],  NITRITE, LEUKOCYTESUR Sepsis Labs: @LABRCNTIP (procalcitonin:4,lacticidven:4) ) Recent Results (from the past 240 hour(s))  Culture, blood (Routine X 2) w Reflex to ID Panel     Status: None (Preliminary result)   Collection Time: 06/21/16  2:00 AM  Result Value Ref Range Status   Specimen Description BLOOD RIGHT ARM  Final   Special Requests BOTTLES DRAWN AEROBIC AND ANAEROBIC 5ML  Final   Culture NO GROWTH 1 DAY  Final   Report Status PENDING  Incomplete  Culture, blood (Routine X 2) w Reflex to ID Panel     Status: None (Preliminary result)   Collection Time: 06/21/16  2:04 AM  Result Value Ref Range Status   Specimen Description BLOOD RIGHT HAND  Final   Special Requests IN PEDIATRIC BOTTLE 3ML  Final   Culture NO GROWTH 1 DAY  Final   Report Status PENDING  Incomplete  MRSA PCR Screening     Status: None   Collection Time: 06/21/16 10:40 AM  Result Value Ref Range Status   MRSA by PCR NEGATIVE NEGATIVE Final    Comment:        The GeneXpert MRSA Assay (FDA approved for NASAL specimens only), is one component of a comprehensive MRSA colonization surveillance program. It is not intended to diagnose MRSA infection nor to guide or monitor treatment for MRSA infections.          Radiology Studies: Dg Foot Complete Right  Result Date: 06/20/2016 CLINICAL  DATA:  Infected forefoot. EXAM: RIGHT FOOT COMPLETE - 3+ VIEW COMPARISON:  None. FINDINGS: Amputations of the first and second rays at the level of the first tarsometatarsal articulation and at the second distal metatarsal shaft. There is new bone loss at the distal tuft of the third distal phalanx. There is soft tissue swelling and probable soft tissue gas in the third phalanx. The findings are suspicious for osteomyelitis. IMPRESSION: Probable osteomyelitis of the third digit. Electronically Signed   By: Andreas Newport M.D.   On: 06/20/2016 23:52      Scheduled Meds: . aspirin EC  81 mg Oral Daily  . carvedilol  3.125 mg Oral BID WC  . digoxin  0.125 mg Oral Daily  . gabapentin  300 mg Oral Daily  . insulin aspart  0-9 Units Subcutaneous TID WC  . insulin detemir  15 Units Subcutaneous QHS  . midodrine  5 mg Oral TID WC  . pantoprazole  40 mg Oral Daily  . piperacillin-tazobactam (ZOSYN)  IV  3.375 g Intravenous Q8H  . pramipexole  0.125 mg Oral QHS  . rosuvastatin  40 mg Oral Daily  . vancomycin  1,000 mg Intravenous Q24H  . [START ON 06/23/2016] warfarin  5 mg Oral Q T,Th,S,Su-1800  . warfarin  7.5 mg Oral Q M,W,F-1800  . Warfarin - Pharmacist Dosing Inpatient   Does not apply q1800   Continuous Infusions: . sodium chloride 10 mL/hr at 06/22/16 0024     LOS: 1 day    Time spent in minutes: 54    [place], MD [facility] Pager: [email or web address] Password [email or web address] 06/22/2016, 4:52 PM

## 2016-06-22 NOTE — Care Management Note (Signed)
Case Management Note  Patient Details  Name: William [name] MRN: [id number] Date of Birth: 1951-02-26  Subjective/Objective:                    Action/Plan:  Await post op PT eval Expected Discharge Date:  06/21/16               Expected Discharge Plan:     In-House Referral:     Discharge planning Services  CM Consult  Post Acute Care Choice:  Durable Medical Equipment, Home Health Choice offered to:     DME Arranged:    DME Agency:     HH Arranged:    [place] Agency:     Status of Service:  In process, will continue to follow  If discussed at Long Length of Stay Meetings, dates discussed:    Additional Comments:  Marilu Favre, RN 06/22/2016, 7:39 AM

## 2016-06-22 NOTE — Plan of Care (Signed)
Problem: Safety: Goal: Ability to remain free from injury will improve Outcome: Progressing Patient remains free of falls and injuries  Problem: Pain Managment: Goal: General experience of comfort will improve Outcome: Progressing Post surgery with block. No complaints of pain  Problem: Skin Integrity: Goal: Risk for impaired skin integrity will decrease Outcome: Progressing Patient is able to turn self in bed and moves all extremities.

## 2016-06-22 NOTE — Progress Notes (Signed)
Physical Therapy Evaluation Patient Details Name: William [name] MRN: [id number] DOB: 09-16-1950 Today's Date: 06/22/2016   History of Present Illness  William [name] is a 65 y.o. male admitted on 06/21/2016 with R-diabetic foot infection. PMH: smoker, HTN, DM, and HLD  Clinical Impression  Pt refused to ambulate this session without verbal clearance from MD. Dr. Wynelle Cleveland accompanied PT to pt room to confirm he is cleared to walk with NWB on R foot but pt adamant that he needs to hear from Dr. Sharol Given. Pt reports that he "put his foot down to walk to the bathroom last night and it was bleeding." Pt instructed on NWB status of foot. PTA, pt was independent with all ADLs and community ambulation without an assistive device. Pt currently lives in 2 story home with family available to assist at discharge (grandchildren, children, spouse). Pt is able to live only on 1st floor during recovery period. Pt agreeable to stand pivot to chair this session. Pt with good strength to maintain NWB status and was able to demonstrate bed mobility and transfers with supervision. Pt with slightly impulsive behavior and required verbal cues for safety. Pt would benefit from HHPT to evaluate home environment for safety and to address concerns for mobility. PT will continue to follow acutely to further assess mobility and stair navigation if possible before return to home.    Follow Up Recommendations Home health PT;Supervision for mobility/OOB    Equipment Recommendations  None recommended by PT    Recommendations for Other Services       Precautions / Restrictions Precautions Precautions: Fall Restrictions Weight Bearing Restrictions: Yes RLE Weight Bearing: Non weight bearing      Mobility  Bed Mobility Overal bed mobility: Needs Assistance Bed Mobility: Supine to Sit     Supine to sit: Supervision     General bed mobility comments: HOB elevated. use of bed rails  Transfers Overall transfer level: Needs  assistance Equipment used: Rolling walker (2 wheeled) Transfers: Sit to/from Stand Sit to Stand: Supervision         General transfer comment: Able to perform stand pivot without physical assist. Good ability to maintain NWB status. Supervision for safety as pt demonstrated slight impulsive behavior.  Ambulation/Gait             General Gait Details: Pt refused to ambulate without "hearing from the doctor's mouth" that he was cleared to walk  Stairs            Wheelchair Mobility    Modified Rankin (Stroke Patients Only)       Balance Overall balance assessment: Needs assistance Sitting-balance support: No upper extremity supported;Feet supported Sitting balance-Leahy Scale: Good     Standing balance support: During functional activity;Bilateral upper extremity supported Standing balance-Leahy Scale: Poor Standing balance comment: Pt reliant on B UE to maintain NWB status of RLE                             Pertinent Vitals/Pain Pain Assessment: No/denies pain    Home Living Family/patient expects to be discharged to:: Private residence Living Arrangements: Spouse/significant other;Children Available Help at Discharge: Family;Available 24 hours/day Type of Home: House Home Access: Stairs to enter Entrance Stairs-Rails: None Entrance Stairs-Number of Steps: 2 Home Layout: Two level;Able to live on main level with bedroom/bathroom Home Equipment: Bedside commode;Wheelchair - [employer] - 2 wheels;Cane - single point;Shower seat;Grab bars - toilet;Grab bars - tub/shower;Hand held shower head  Prior Function Level of Independence: Independent               Hand Dominance   Dominant Hand: Right    Extremity/Trunk Assessment   Upper Extremity Assessment Upper Extremity Assessment: Overall WFL for tasks assessed    Lower Extremity Assessment Lower Extremity Assessment: Overall WFL for tasks assessed       Communication    Communication: No difficulties  Cognition Arousal/Alertness: Awake/alert Behavior During Therapy: WFL for tasks assessed/performed Overall Cognitive Status: Within Functional Limits for tasks assessed                      General Comments      Exercises     Assessment/Plan    PT Assessment Patient needs continued PT services  PT Problem List Decreased strength;Decreased balance;Decreased activity tolerance;Decreased mobility;Decreased knowledge of use of DME;Decreased safety awareness;Decreased knowledge of precautions          PT Treatment Interventions DME instruction;Gait training;Stair training;Functional mobility training;Therapeutic activities;Therapeutic exercise;Balance training;Patient/family education    PT Goals (Current goals can be found in the Care Plan section)  Acute Rehab PT Goals Patient Stated Goal: to get better PT Goal Formulation: With patient Time For Goal Achievement: 07/06/16 Potential to Achieve Goals: Good    Frequency Min 5X/week   Barriers to discharge        Co-evaluation               End of Session Equipment Utilized During Treatment: Gait belt Activity Tolerance: Patient tolerated treatment well Patient left: in chair;with call bell/phone within reach Nurse Communication: Mobility status         Time: [id number] PT Time Calculation (min) (ACUTE ONLY): 33 min   Charges:   PT Evaluation $PT Eval Moderate Complexity: 1 Procedure PT Treatments $Therapeutic Activity: 8-22 mins   PT G Codes:        Tonia Brooms Jul 01, 2016, 11:34 AM Tonia Brooms, SPT [phone]

## 2016-06-22 NOTE — Progress Notes (Signed)
Pharmacy Anticoagulation Note  William [name] is a 65 y.o. male admitted on 06/21/2016 with R-diabetic foot infection. Patient is now s/ R TMA. Will resume warfarin for hx Afib.  HOME REGIMEN:  Warfarin 5mg  daily except 7.5mg  every MWF.  Assessment:  INR 2.02 this AM after resuming therapy with Warfarin 7.5mg  x 1 last night. H/H = 9.5/28.6  Platelets = 286K Bleeding - None noted  Plan: Will resume home regimen and monitor closely. Daily INR CBC every 72 hours at minimum  Rober Minion, PharmD., [place] Clinical Pharmacist Pager:  [phone] Thank you for allowing pharmacy to be part of this patients care team.  06/22/2016 10:01 AM

## 2016-06-23 ENCOUNTER — Other Ambulatory Visit (HOSPITAL_COMMUNITY): Payer: Self-pay | Admitting: Student

## 2016-06-23 DIAGNOSIS — M869 Osteomyelitis, unspecified: Secondary | ICD-10-CM

## 2016-06-23 DIAGNOSIS — N1832 Chronic kidney disease, stage 3b: Secondary | ICD-10-CM

## 2016-06-23 DIAGNOSIS — N183 Chronic kidney disease, stage 3 unspecified: Secondary | ICD-10-CM

## 2016-06-23 LAB — PROTIME-INR
INR: 2.52
PROTHROMBIN TIME: 27.6 s — AB (ref 11.4–15.2)

## 2016-06-23 LAB — BASIC METABOLIC PANEL
ANION GAP: 6 (ref 5–15)
BUN: 16 mg/dL (ref 6–20)
CALCIUM: 8.9 mg/dL (ref 8.9–10.3)
CO2: 27 mmol/L (ref 22–32)
Chloride: 104 mmol/L (ref 101–111)
Creatinine, Ser: 1.66 mg/dL — ABNORMAL HIGH (ref 0.61–1.24)
GFR calc Af Amer: 48 mL/min — ABNORMAL LOW (ref >60)
GFR, EST NON AFRICAN AMERICAN: 42 mL/min — AB (ref >60)
GLUCOSE: 207 mg/dL — AB (ref 65–99)
POTASSIUM: 4.3 mmol/L (ref 3.5–5.1)
SODIUM: 137 mmol/L (ref 135–145)

## 2016-06-23 LAB — CBC
HCT: 28.5 % — ABNORMAL LOW (ref 39.0–52.0)
HEMOGLOBIN: 9.2 g/dL — AB (ref 13.0–17.0)
MCH: 25.4 pg — ABNORMAL LOW (ref 26.0–34.0)
MCHC: 32.3 g/dL (ref 30.0–36.0)
MCV: 78.7 fL (ref 78.0–100.0)
Platelets: 304 10*3/uL (ref 150–400)
RBC: 3.62 MIL/uL — ABNORMAL LOW (ref 4.22–5.81)
RDW: 15.5 % (ref 11.5–15.5)
WBC: 9.4 10*3/uL (ref 4.0–10.5)

## 2016-06-23 LAB — GLUCOSE, CAPILLARY
GLUCOSE-CAPILLARY: 119 mg/dL — AB (ref 65–99)
Glucose-Capillary: 185 mg/dL — ABNORMAL HIGH (ref 65–99)

## 2016-06-23 MED ORDER — POLYETHYLENE GLYCOL 3350 17 G PO PACK: 17.0000 g | PACK | Freq: Every day | ORAL | Status: DC | PRN

## 2016-06-23 MED ORDER — TORSEMIDE 20 MG PO TABS: 20.0000 mg | ORAL_TABLET | ORAL | Status: DC

## 2016-06-23 MED ORDER — BISACODYL 5 MG PO TBEC: 10.0000 mg | DELAYED_RELEASE_TABLET | Freq: Every day | ORAL | 0 refills | Status: DC | PRN

## 2016-06-23 MED ORDER — SPIRONOLACTONE 25 MG PO TABS
25.0000 mg | ORAL_TABLET | Freq: Every day | ORAL | Status: DC
  Administered 2016-06-23: 25 mg via ORAL
  Filled 2016-06-23: qty 1

## 2016-06-23 MED ORDER — BISACODYL 5 MG PO TBEC: 10.0000 mg | DELAYED_RELEASE_TABLET | Freq: Every day | ORAL | Status: DC | PRN

## 2016-06-23 MED ORDER — ACETAMINOPHEN 325 MG PO TABS: 650.0000 mg | ORAL_TABLET | Freq: Four times a day (QID) | ORAL | Status: DC | PRN

## 2016-06-23 MED ORDER — POLYETHYLENE GLYCOL 3350 17 G PO PACK: 17.0000 g | PACK | Freq: Every day | ORAL | 0 refills | Status: DC | PRN

## 2016-06-23 MED ORDER — OXYCODONE HCL 5 MG PO TABS: 5.0000 mg | ORAL_TABLET | ORAL | 0 refills | Status: DC | PRN

## 2016-06-23 NOTE — Discharge Summary (Addendum)
Physician Discharge Summary  William [name] [id number] DOB: [birth year] DOA: 06/21/2016  PCP: William Fendt, MD  Admit date: 06/21/2016 Discharge date: 06/23/2016  Admitted From: home  Disposition:  home   Home Health:  ordered  Equipment/Devices:  crutches    Discharge Condition:  stable   CODE STATUS:  Full code   Diet recommendation:  Heart healthy, diabetic Consultations:  ortho    Discharge Diagnoses:  Principal Problem:   Sepsis, unspecified organism ([place]) Active Problems:   Gangrene of toe of right foot ([place])   Type 2 diabetes mellitus with diabetic peripheral angiopathy and gangrene, with long-term current use of insulin ([place])   Essential hypertension   Paroxysmal atrial fibrillation ([place])   Coronary artery disease due to lipid rich plaque   Chronic systolic HF (heart failure) ([name])   Osteomyelitis ([name])   AKI (acute kidney injury) ([place])   Anemia   Hyponatremia   CKD (chronic kidney disease) stage 3, GFR 30-59 ml/min    Subjective: He has no complaints and is asking about discharge.   HPI: William [name] a 65 y.o.malewith a past medical history significant for IDDM, HTN, PVD and osteomyelitis requiring right 1st/2nd toe amputation, recent STEMI c/b LV (EF 40-45%), RV failure and cardiogenic shock in setting of VSD, treated with CABG and VSD repair, post-op Afib now on warfarinwho presents with toe pain, redness, discharge and swelling.  The patient had recent complicated admission in Sept for STEMI, now s/p CABG, right 1st and 2nd toe amputations, followed by CHF service, on warfarin for Afib.   Since his amputation he was doing well, but had been noticing dry gangrene of his 3rd toe and was being followed by his Orthopedist. Last week, he developed an upper respiratory infection with fever, cough, congestion for a few days, that resolved by itself. However as this was resolving, he noticed that he was starting to have more pain, bloody discharge, and  swelling around his right third toe. In the last 24 hours, the pain got severe, he had fever to 101F at home, generalized malaise, increased discharge, decreased appetite, and hyperglycemia.  Admitted for right foot/ toe gangrene/ cellulitis and resultant sepsis.   Hospital Course:  Principal Problem:   Sepsis, unspecified organism/gangrene and osteomyelitis of right 3rd toe and cellulitis of right foot - PVD -Status post transmetatarsal amputation 12/22 advised by Ortho, William [name],  to stop antibiotics in 48 hrs, non-weightbearing on right and f/u with him in the office in 2 wks   Active Problems:   Type 2 diabetes mellitus with diabetic peripheral angiopathy and gangrene, with long-term current use of insulin - Continue Levemir and sliding scale insulin as before    Paroxysmal atrial fibrillation -Digoxin, carvedilol, warfarin   Chronic hypotension?? - on Midodrine as outpt  Recent and STEMI - Statin, aspirin, beta blocker    Chronic systolic HF (heart failure)- EF 40-45% - compensated - Aldactone and Demadex on hold yesterday and resumed today- weight and I and O being followed closely  CKD 3 - Cr Stable   Discharge Instructions  Discharge Instructions    (HEART FAILURE PATIENTS) Call MD:  Anytime you have any of the following symptoms: 1) 3 pound weight gain in 24 hours or 5 pounds in 1 week 2) shortness of breath, with or without a dry hacking cough 3) swelling in the hands, feet or stomach 4) if you have to sleep on extra pillows at night in order to breathe.    Complete by:  As  directed    Call MD for:  persistant nausea and vomiting    Complete by:  As directed    Call MD for:  redness, tenderness, or signs of infection (pain, swelling, redness, odor or green/yellow discharge around incision site)    Complete by:  As directed    Call MD for:  severe uncontrolled pain    Complete by:  As directed    Call MD for:  temperature >100.4    Complete by:  As directed     Diet - low sodium heart healthy    Complete by:  As directed    Diet Carb Modified    Complete by:  As directed    Elevate operative extremity    Complete by:  As directed    Increase activity slowly    Complete by:  As directed    Non weight bearing    Complete by:  As directed    Laterality:  right   Extremity:  Lower     Allergies as of 06/23/2016      Reactions   Morphine And Related Other (See Comments)   UNSPECIFIED REACTION "Pt said it was too much"      Medication List    TAKE these medications   acetaminophen 325 MG tablet Commonly known as:  TYLENOL Take 2 tablets (650 mg total) by mouth every 6 (six) hours as needed for mild pain (or Fever >/= 101).   aspirin EC 81 MG tablet Take 81 mg by mouth daily.   bisacodyl 5 MG EC tablet Commonly known as:  DULCOLAX Take 2 tablets (10 mg total) by mouth daily as needed for moderate constipation.   carvedilol 3.125 MG tablet Commonly known as:  COREG Take 1 tablet (3.125 mg total) by mouth 2 (two) times daily.   digoxin 0.125 MG tablet Commonly known as:  LANOXIN Take 1 tablet (0.125 mg total) by mouth daily.   gabapentin 300 MG capsule Commonly known as:  NEURONTIN Take 300 mg by mouth every morning.   insulin detemir 100 UNIT/ML injection Commonly known as:  LEVEMIR Inject 40-50 Units into the skin 2 (two) times daily. Take 50 every morning  Take 40 units every evening   midodrine 5 MG tablet Commonly known as:  PROAMATINE Take 1 tablet (5 mg total) by mouth 3 (three) times daily with meals.   nitroGLYCERIN 0.4 mg/hr patch Commonly known as:  NITRODUR - Dosed in mg/24 hr Place 1 patch (0.4 mg total) onto the skin daily.   oxyCODONE 5 MG immediate release tablet Commonly known as:  Oxy IR/ROXICODONE Take 1 tablet (5 mg total) by mouth every 4 (four) hours as needed for moderate pain.   oxyCODONE-acetaminophen 5-325 MG tablet Commonly known as:  PERCOCET/ROXICET Take 1 tablet by mouth every 6 (six)  hours as needed for severe pain.   pantoprazole 40 MG tablet Commonly known as:  PROTONIX Take 40 mg by mouth daily.   polyethylene glycol packet Commonly known as:  MIRALAX / GLYCOLAX Take 17 g by mouth daily as needed for moderate constipation.   pramipexole 0.125 MG tablet Commonly known as:  MIRAPEX Take 0.125 mg by mouth at bedtime.   rosuvastatin 40 MG tablet Commonly known as:  CRESTOR Take 1 tablet (40 mg total) by mouth daily.   senna 8.6 MG Tabs tablet Commonly known as:  SENOKOT Take 1-2 tablets by mouth daily as needed for mild constipation.   silver sulfADIAZINE 1 % cream Commonly known as:  SILVADENE Apply 1 application topically daily.   spironolactone 25 MG tablet Commonly known as:  ALDACTONE Take 1 tablet (25 mg total) by mouth daily.   torsemide 20 MG tablet Commonly known as:  DEMADEX Take 1 tablet (20 mg total) by mouth 3 (three) times a week. Every Mon, Wed and Fri   warfarin 5 MG tablet Commonly known as:  COUMADIN Take 1-1.5 tablets (5-7.5 mg total) by mouth daily. Take 5 mg (1 tab) daily except 7.5 mg (1 and 1/2 tabs) on Monday, Wednesday and Friday            Durable Medical Equipment        Start     Ordered   06/23/16 1300  For home use only DME Crutches  Once     06/23/16 1259   06/23/16 1228  For home use only DME Crutches  Once     06/23/16 1227     Follow-up Information    Newt Minion, MD In 2 weeks.   Specialty:  Orthopedic Surgery Contact information: [address] [place] [address] [phone]          Allergies  Allergen Reactions  . Morphine And Related Other (See Comments)    UNSPECIFIED REACTION "Pt said it was too much"      Procedures/Studies: 06/21/16- transmetatarsal amputation  Dg Foot Complete Right  Result Date: 06/20/2016 CLINICAL DATA:  Infected forefoot. EXAM: RIGHT FOOT COMPLETE - 3+ VIEW COMPARISON:  None. FINDINGS: Amputations of the first and second rays at the level  of the first tarsometatarsal articulation and at the second distal metatarsal shaft. There is new bone loss at the distal tuft of the third distal phalanx. There is soft tissue swelling and probable soft tissue gas in the third phalanx. The findings are suspicious for osteomyelitis. IMPRESSION: Probable osteomyelitis of the third digit. Electronically Signed   By: Andreas Newport M.D.   On: 06/20/2016 23:52       Discharge Exam: Vitals:   06/22/16 2154 06/23/16 0457  BP: (!) 107/55 (!) 96/53  Pulse: 85 85  Resp: 16 16  Temp: 98.9 F (37.2 C) 98.8 F (37.1 C)   Vitals:   06/22/16 1424 06/22/16 1753 06/22/16 2154 06/23/16 0457  BP: 112/60 105/61 (!) 107/55 (!) 96/53  Pulse: 94  85 85  Resp: 17  16 16   Temp: 98.8 F (37.1 C)  98.9 F (37.2 C) 98.8 F (37.1 C)  TempSrc: Oral  Oral Oral  SpO2: 100%  97% 95%  Weight:    68.8 kg (151 lb 11.2 oz)  Height:        General: Pt is alert, awake, not in acute distress Cardiovascular: RRR, S1/S2 +, no rubs, no gallops Respiratory: CTA bilaterally, no wheezing, no rhonchi Abdominal: Soft, NT, ND, bowel sounds + Extremities: no edema, no cyanosis- right foot in dressing   - The results of significant diagnostics from this hospitalization (including imaging, microbiology, ancillary and laboratory) are listed below for reference.     Microbiology: Recent Results (from the past 240 hour(s))  Culture, blood (Routine X 2) w Reflex to ID Panel     Status: None (Preliminary result)   Collection Time: 06/21/16  2:00 AM  Result Value Ref Range Status   Specimen Description BLOOD RIGHT ARM  Final   Special Requests BOTTLES DRAWN AEROBIC AND ANAEROBIC 5ML  Final   Culture NO GROWTH 2 DAYS  Final   Report Status PENDING  Incomplete  Culture, blood (  Routine X 2) w Reflex to ID Panel     Status: None (Preliminary result)   Collection Time: 06/21/16  2:04 AM  Result Value Ref Range Status   Specimen Description BLOOD RIGHT HAND  Final    Special Requests IN PEDIATRIC BOTTLE 3ML  Final   Culture NO GROWTH 2 DAYS  Final   Report Status PENDING  Incomplete  MRSA PCR Screening     Status: None   Collection Time: 06/21/16 10:40 AM  Result Value Ref Range Status   MRSA by PCR NEGATIVE NEGATIVE Final    Comment:        The GeneXpert MRSA Assay (FDA approved for NASAL specimens only), is one component of a comprehensive MRSA colonization surveillance program. It is not intended to diagnose MRSA infection nor to guide or monitor treatment for MRSA infections.      Labs: BNP (last 3 results) No results for input(s): BNP in the last 8760 hours. Basic Metabolic Panel:  Recent Labs Lab 06/20/16 2329 06/22/16 0403 06/22/16 1033 06/23/16 0416  NA 132* 135  --  137  K 3.7 3.3*  --  4.3  CL 98* 102  --  104  CO2 24 24  --  27  GLUCOSE 224* 181*  --  207*  BUN 19 14  --  16  CREATININE 1.77* 1.74*  --  1.66*  CALCIUM 8.7* 8.6*  --  8.9  MG  --   --  2.3  --    Liver Function Tests:  Recent Labs Lab 06/20/16 2329 06/22/16 0403  AST 22 26  ALT 16* 16*  ALKPHOS 195* 179*  BILITOT 0.3 0.5  PROT 6.9 6.1*  ALBUMIN 3.0* 2.5*   No results for input(s): LIPASE, AMYLASE in the last 168 hours. No results for input(s): AMMONIA in the last 168 hours. CBC:  Recent Labs Lab 06/20/16 2329 06/22/16 0403 06/23/16 0416  WBC 15.8* 11.8* 9.4  NEUTROABS 12.2*  --   --   HGB 10.6* 9.5* 9.2*  HCT 31.7* 28.6* 28.5*  MCV 77.5* 77.7* 78.7  PLT 294 286 304   Cardiac Enzymes: No results for input(s): CKTOTAL, CKMB, CKMBINDEX, TROPONINI in the last 168 hours. BNP: Invalid input(s): POCBNP CBG:  Recent Labs Lab 06/22/16 1200 06/22/16 1633 06/22/16 2150 06/23/16 0833 06/23/16 1153  GLUCAP 244* 166* 239* 119* 185*   D-Dimer No results for input(s): DDIMER in the last 72 hours. Hgb A1c No results for input(s): HGBA1C in the last 72 hours. Lipid Profile No results for input(s): CHOL, HDL, LDLCALC, TRIG, CHOLHDL,  LDLDIRECT in the last 72 hours. Thyroid function studies No results for input(s): TSH, T4TOTAL, T3FREE, THYROIDAB in the last 72 hours.  Invalid input(s): FREET3 Anemia work up No results for input(s): VITAMINB12, FOLATE, FERRITIN, TIBC, IRON, RETICCTPCT in the last 72 hours. Urinalysis No results found for: COLORURINE, APPEARANCEUR, [place], [place], [place], [place], [place], [place], PROTEINUR, UROBILINOGEN, NITRITE, LEUKOCYTESUR Sepsis Labs Invalid input(s): PROCALCITONIN,  WBC,  LACTICIDVEN Microbiology Recent Results (from the past 240 hour(s))  Culture, blood (Routine X 2) w Reflex to ID Panel     Status: None (Preliminary result)   Collection Time: 06/21/16  2:00 AM  Result Value Ref Range Status   Specimen Description BLOOD RIGHT ARM  Final   Special Requests BOTTLES DRAWN AEROBIC AND ANAEROBIC 5ML  Final   Culture NO GROWTH 2 DAYS  Final   Report Status PENDING  Incomplete  Culture, blood (Routine X 2) w Reflex to ID Panel  Status: None (Preliminary result)   Collection Time: 06/21/16  2:04 AM  Result Value Ref Range Status   Specimen Description BLOOD RIGHT HAND  Final   Special Requests IN PEDIATRIC BOTTLE 3ML  Final   Culture NO GROWTH 2 DAYS  Final   Report Status PENDING  Incomplete  MRSA PCR Screening     Status: None   Collection Time: 06/21/16 10:40 AM  Result Value Ref Range Status   MRSA by PCR NEGATIVE NEGATIVE Final    Comment:        The GeneXpert MRSA Assay (FDA approved for NASAL specimens only), is one component of a comprehensive MRSA colonization surveillance program. It is not intended to diagnose MRSA infection nor to guide or monitor treatment for MRSA infections.      Time coordinating discharge: Over 30 minutes  SIGNED:   Debbe Odea, MD  [facility] 06/23/2016, 3:01 PM Pager   If 7PM-7AM, please contact night-coverage [email or web address] Password [email or web address]

## 2016-06-23 NOTE — Progress Notes (Signed)
Physical Therapy Treatment Patient Details Name: William [name] MRN: [id number] DOB: 07-Sep-1950 Today's Date: 06/23/2016    History of Present Illness Idiris Capano is a 65 y.o. male admitted on 06/21/2016 with R-diabetic foot infection. PMH: smoker, HTN, DM, and HLD    PT Comments    Pt admitted with above diagnosis. Pt currently with functional limitations due to balance and endurance deficits. Pt was able to stand and pivot to chair.  Pt still impulsive but a little better today than yesterday.  Pt declined steps practice as he states he actually has a ramp.  Will continue PT as pt allows.   Pt will benefit from skilled PT to increase their independence and safety with mobility to allow discharge to the venue listed below.    Follow Up Recommendations  Home health PT;Supervision for mobility/OOB     Equipment Recommendations  None recommended by PT    Recommendations for Other Services       Precautions / Restrictions Precautions Precautions: Fall Restrictions Weight Bearing Restrictions: Yes RLE Weight Bearing: Non weight bearing    Mobility  Bed Mobility Overal bed mobility: Needs Assistance Bed Mobility: Supine to Sit     Supine to sit: Independent        Transfers Overall transfer level: Needs assistance Equipment used: Rolling walker (2 wheeled) Transfers: Sit to/from [facility] Sit to Stand: Supervision Stand pivot transfers: Min guard;Supervision       General transfer comment: Able to perform stand pivot without physical assist. Good ability to maintain NWB status. Supervision for safety as pt demonstrated slight impulsive behavior.  Ambulation/Gait             General Gait Details: Pt refused to ambulate without "hearing from the doctor's mouth" that he was cleared to walk   Stairs Stairs:  (Today pt states he has a ramp at home)          Wheelchair Mobility    Modified Rankin (Stroke Patients Only)       Balance  Overall balance assessment: Needs assistance Sitting-balance support: No upper extremity supported;Feet supported Sitting balance-Leahy Scale: Good     Standing balance support: During functional activity;Bilateral upper extremity supported Standing balance-Leahy Scale: Poor Standing balance comment: Pt reliant on B UE to maintain NWB status of RLE                    Cognition Arousal/Alertness: Awake/alert Behavior During Therapy: WFL for tasks assessed/performed Overall Cognitive Status: Within Functional Limits for tasks assessed                      Exercises General Exercises - Lower Extremity Long Arc Quad: AROM;Both;10 reps;Seated Hip Flexion/Marching: AROM;Both;10 reps;Seated    General Comments        Pertinent Vitals/Pain Pain Assessment: No/denies pain  VSS    Home Living         Home Access: Ramped entrance            Prior Function            PT Goals (current goals can now be found in the care plan section) Acute Rehab PT Goals Patient Stated Goal: to get better Progress towards PT goals: Progressing toward goals    Frequency    Min 5X/week      PT Plan Current plan remains appropriate    Co-evaluation             End of Session Equipment  Utilized During Treatment: Gait belt Activity Tolerance: Patient tolerated treatment well Patient left: in chair;with call bell/phone within reach     Time: 1136-1146 PT Time Calculation (min) (ACUTE ONLY): 10 min  Charges:  $Therapeutic Activity: 8-22 mins                    G Codes:      Denice Paradise 2016-06-24, 2:12 PM Aashika Carta,PT Acute Rehabilitation [phone] (pager)

## 2016-06-23 NOTE — Progress Notes (Signed)
Pharmacy Anticoagulation Note  William [name] is a 65 y.o. male admitted on 06/21/2016 with R-diabetic foot infection. Patient is now s/ R TMA. Will resume warfarin for hx Afib.  HOME REGIMEN:  Warfarin 5mg  daily except 7.5mg  every MWF.  Assessment: INR 2.52 this AM after resuming therapy with Warfarin 7.5mg  x 1 last night. H/H = 9.2/28.5  Platelets = 304K Bleeding - None noted  Plan: Will resume home regimen and monitor closely. Daily INR CBC every 72 hours at minimum  Rober Minion, PharmD., MS Clinical Pharmacist Pager:  [phone] Thank you for allowing pharmacy to be part of this patients care team.  06/23/2016 10:25 AM

## 2016-06-23 NOTE — Progress Notes (Signed)
Orthopedic Tech Progress Note Patient Details:  William [name] Dec 01, 1950 [id number]  Patient ID: William [name], male   DOB: Aug 12, 1950, 65 y.o.   MRN: [id number]   William [name] 06/23/2016, 3:09 PM Pt unable to use crutches; RN notified; viewed order from RN order list

## 2016-06-26 LAB — CULTURE, BLOOD (ROUTINE X 2)
CULTURE: NO GROWTH
Culture: NO GROWTH

## 2016-06-27 DIAGNOSIS — N183 Chronic kidney disease, stage 3 (moderate): Secondary | ICD-10-CM | POA: Diagnosis not present

## 2016-06-27 DIAGNOSIS — Z7901 Long term (current) use of anticoagulants: Secondary | ICD-10-CM | POA: Diagnosis not present

## 2016-06-27 DIAGNOSIS — I5022 Chronic systolic (congestive) heart failure: Secondary | ICD-10-CM | POA: Diagnosis not present

## 2016-06-27 DIAGNOSIS — I255 Ischemic cardiomyopathy: Secondary | ICD-10-CM | POA: Diagnosis not present

## 2016-06-27 DIAGNOSIS — Z89421 Acquired absence of other right toe(s): Secondary | ICD-10-CM | POA: Diagnosis not present

## 2016-06-27 DIAGNOSIS — I130 Hypertensive heart and chronic kidney disease with heart failure and stage 1 through stage 4 chronic kidney disease, or unspecified chronic kidney disease: Secondary | ICD-10-CM | POA: Diagnosis not present

## 2016-06-27 DIAGNOSIS — Z794 Long term (current) use of insulin: Secondary | ICD-10-CM | POA: Diagnosis not present

## 2016-06-27 DIAGNOSIS — E1122 Type 2 diabetes mellitus with diabetic chronic kidney disease: Secondary | ICD-10-CM | POA: Diagnosis not present

## 2016-06-27 DIAGNOSIS — Z4781 Encounter for orthopedic aftercare following surgical amputation: Secondary | ICD-10-CM | POA: Diagnosis not present

## 2016-06-27 DIAGNOSIS — Z87891 Personal history of nicotine dependence: Secondary | ICD-10-CM | POA: Diagnosis not present

## 2016-06-27 DIAGNOSIS — E785 Hyperlipidemia, unspecified: Secondary | ICD-10-CM | POA: Diagnosis not present

## 2016-06-27 DIAGNOSIS — Z951 Presence of aortocoronary bypass graft: Secondary | ICD-10-CM | POA: Diagnosis not present

## 2016-06-27 DIAGNOSIS — I480 Paroxysmal atrial fibrillation: Secondary | ICD-10-CM | POA: Diagnosis not present

## 2016-06-27 DIAGNOSIS — I2510 Atherosclerotic heart disease of native coronary artery without angina pectoris: Secondary | ICD-10-CM | POA: Diagnosis not present

## 2016-06-27 DIAGNOSIS — Z9181 History of falling: Secondary | ICD-10-CM | POA: Diagnosis not present

## 2016-06-27 DIAGNOSIS — E1151 Type 2 diabetes mellitus with diabetic peripheral angiopathy without gangrene: Secondary | ICD-10-CM | POA: Diagnosis not present

## 2016-06-27 DIAGNOSIS — D649 Anemia, unspecified: Secondary | ICD-10-CM | POA: Diagnosis not present

## 2016-06-28 ENCOUNTER — Telehealth (INDEPENDENT_AMBULATORY_CARE_PROVIDER_SITE_OTHER): Payer: Self-pay | Admitting: Orthopedic Surgery

## 2016-06-28 NOTE — Telephone Encounter (Signed)
[facility]urse with [facility] wanted to let you know she seen pt yesterday and recommends phys therapy 2x week 3 weeks.   she said pt needs a ramp for his home. She said someone from pt church is sending paperwork regarding this.  Also,  Pt had fall Sunday, he has small abrasion on his leg, daughter putting dry dressing but it is superficial.   [phone] Rachel Moulds

## 2016-06-29 DIAGNOSIS — Z4781 Encounter for orthopedic aftercare following surgical amputation: Secondary | ICD-10-CM | POA: Diagnosis not present

## 2016-06-29 DIAGNOSIS — I130 Hypertensive heart and chronic kidney disease with heart failure and stage 1 through stage 4 chronic kidney disease, or unspecified chronic kidney disease: Secondary | ICD-10-CM | POA: Diagnosis not present

## 2016-06-29 DIAGNOSIS — E1122 Type 2 diabetes mellitus with diabetic chronic kidney disease: Secondary | ICD-10-CM | POA: Diagnosis not present

## 2016-06-29 DIAGNOSIS — N183 Chronic kidney disease, stage 3 (moderate): Secondary | ICD-10-CM | POA: Diagnosis not present

## 2016-06-29 DIAGNOSIS — E1151 Type 2 diabetes mellitus with diabetic peripheral angiopathy without gangrene: Secondary | ICD-10-CM | POA: Diagnosis not present

## 2016-06-29 DIAGNOSIS — I5022 Chronic systolic (congestive) heart failure: Secondary | ICD-10-CM | POA: Diagnosis not present

## 2016-07-03 NOTE — Telephone Encounter (Signed)
Noted patient has follow up appt this week will address at appointment.

## 2016-07-04 ENCOUNTER — Other Ambulatory Visit (HOSPITAL_COMMUNITY): Payer: Self-pay | Admitting: Internal Medicine

## 2016-07-04 ENCOUNTER — Other Ambulatory Visit: Payer: Self-pay | Admitting: Physician Assistant

## 2016-07-04 DIAGNOSIS — E1151 Type 2 diabetes mellitus with diabetic peripheral angiopathy without gangrene: Secondary | ICD-10-CM | POA: Diagnosis not present

## 2016-07-04 DIAGNOSIS — Z4781 Encounter for orthopedic aftercare following surgical amputation: Secondary | ICD-10-CM | POA: Diagnosis not present

## 2016-07-04 DIAGNOSIS — E1122 Type 2 diabetes mellitus with diabetic chronic kidney disease: Secondary | ICD-10-CM | POA: Diagnosis not present

## 2016-07-04 DIAGNOSIS — I5022 Chronic systolic (congestive) heart failure: Secondary | ICD-10-CM | POA: Diagnosis not present

## 2016-07-04 DIAGNOSIS — I130 Hypertensive heart and chronic kidney disease with heart failure and stage 1 through stage 4 chronic kidney disease, or unspecified chronic kidney disease: Secondary | ICD-10-CM | POA: Diagnosis not present

## 2016-07-04 DIAGNOSIS — N183 Chronic kidney disease, stage 3 (moderate): Secondary | ICD-10-CM | POA: Diagnosis not present

## 2016-07-06 ENCOUNTER — Encounter: Payer: Self-pay | Admitting: Family Medicine

## 2016-07-06 ENCOUNTER — Ambulatory Visit (INDEPENDENT_AMBULATORY_CARE_PROVIDER_SITE_OTHER): Payer: Medicare Other | Admitting: Family Medicine

## 2016-07-06 ENCOUNTER — Ambulatory Visit (INDEPENDENT_AMBULATORY_CARE_PROVIDER_SITE_OTHER): Payer: Medicare Other | Admitting: Orthopedic Surgery

## 2016-07-06 VITALS — BP 90/58 | HR 76 | Temp 97.3°F | Resp 14 | Ht 65.0 in | Wt 141.0 lb

## 2016-07-06 DIAGNOSIS — E785 Hyperlipidemia, unspecified: Secondary | ICD-10-CM | POA: Diagnosis not present

## 2016-07-06 DIAGNOSIS — G629 Polyneuropathy, unspecified: Secondary | ICD-10-CM

## 2016-07-06 DIAGNOSIS — I5022 Chronic systolic (congestive) heart failure: Secondary | ICD-10-CM

## 2016-07-06 DIAGNOSIS — N183 Chronic kidney disease, stage 3 unspecified: Secondary | ICD-10-CM

## 2016-07-06 DIAGNOSIS — Z89431 Acquired absence of right foot: Secondary | ICD-10-CM | POA: Insufficient documentation

## 2016-07-06 DIAGNOSIS — R269 Unspecified abnormalities of gait and mobility: Secondary | ICD-10-CM | POA: Insufficient documentation

## 2016-07-06 DIAGNOSIS — I10 Essential (primary) hypertension: Secondary | ICD-10-CM | POA: Diagnosis not present

## 2016-07-06 DIAGNOSIS — F17200 Nicotine dependence, unspecified, uncomplicated: Secondary | ICD-10-CM

## 2016-07-06 DIAGNOSIS — E118 Type 2 diabetes mellitus with unspecified complications: Secondary | ICD-10-CM | POA: Diagnosis not present

## 2016-07-06 DIAGNOSIS — Z794 Long term (current) use of insulin: Secondary | ICD-10-CM

## 2016-07-06 LAB — CBC WITH DIFFERENTIAL/PLATELET
BASOS ABS: 0 {cells}/uL (ref 0–200)
Basophils Relative: 0 %
Eosinophils Absolute: 200 cells/uL (ref 15–500)
Eosinophils Relative: 2 %
HCT: 36.6 % — ABNORMAL LOW (ref 38.5–50.0)
HEMOGLOBIN: 11.7 g/dL — AB (ref 13.2–17.1)
LYMPHS ABS: 2700 {cells}/uL (ref 850–3900)
Lymphocytes Relative: 27 %
MCH: 25.4 pg — AB (ref 27.0–33.0)
MCHC: 32 g/dL (ref 32.0–36.0)
MCV: 79.4 fL — ABNORMAL LOW (ref 80.0–100.0)
MONOS PCT: 7 %
MPV: 9.3 fL (ref 7.5–12.5)
Monocytes Absolute: 700 cells/uL (ref 200–950)
NEUTROS ABS: 6400 {cells}/uL (ref 1500–7800)
Neutrophils Relative %: 64 %
PLATELETS: 289 10*3/uL (ref 140–400)
RBC: 4.61 MIL/uL (ref 4.20–5.80)
RDW: 16.4 % — ABNORMAL HIGH (ref 11.0–15.0)
WBC: 10 10*3/uL (ref 3.8–10.8)

## 2016-07-06 LAB — COMPLETE METABOLIC PANEL WITH GFR
ALBUMIN: 4.1 g/dL (ref 3.6–5.1)
ALK PHOS: 180 U/L — AB (ref 40–115)
ALT: 47 U/L — ABNORMAL HIGH (ref 9–46)
AST: 37 U/L — ABNORMAL HIGH (ref 10–35)
BILIRUBIN TOTAL: 0.6 mg/dL (ref 0.2–1.2)
BUN: 26 mg/dL — ABNORMAL HIGH (ref 7–25)
CO2: 26 mmol/L (ref 20–31)
Calcium: 9.6 mg/dL (ref 8.6–10.3)
Chloride: 101 mmol/L (ref 98–110)
Creat: 1.59 mg/dL — ABNORMAL HIGH (ref 0.70–1.25)
GFR, EST AFRICAN AMERICAN: 52 mL/min — AB (ref >=60)
GFR, EST NON AFRICAN AMERICAN: 45 mL/min — AB (ref >=60)
GLUCOSE: 203 mg/dL — AB (ref 65–99)
POTASSIUM: 4.9 mmol/L (ref 3.5–5.3)
Sodium: 138 mmol/L (ref 135–146)
TOTAL PROTEIN: 7.2 g/dL (ref 6.1–8.1)

## 2016-07-06 LAB — GLUCOSE, CAPILLARY: GLUCOSE-CAPILLARY: 213 mg/dL — AB (ref 65–99)

## 2016-07-06 LAB — POCT GLYCOSYLATED HEMOGLOBIN (HGB A1C): Hemoglobin A1C: 9.2

## 2016-07-06 MED ORDER — ACCU-CHEK AVIVA PLUS W/DEVICE KIT: 1.0000 | PACK | Freq: Three times a day (TID) | 0 refills | Status: DC

## 2016-07-06 MED ORDER — GLUCOSE BLOOD VI STRP: ORAL_STRIP | 12 refills | Status: DC

## 2016-07-06 MED ORDER — ACCU-CHEK SOFT TOUCH LANCETS MISC: 12 refills | Status: DC

## 2016-07-06 MED ORDER — SPIRONOLACTONE 25 MG PO TABS: 12.5000 mg | ORAL_TABLET | Freq: Every day | ORAL | 3 refills | Status: DC

## 2016-07-06 MED ORDER — INSULIN DETEMIR 100 UNIT/ML ~~LOC~~ SOLN: SUBCUTANEOUS | 11 refills | Status: DC

## 2016-07-06 MED ORDER — INSULIN ASPART 100 UNIT/ML FLEXPEN: PEN_INJECTOR | SUBCUTANEOUS | 11 refills | Status: DC

## 2016-07-06 MED ORDER — INSULIN ASPART 100 UNIT/ML ~~LOC~~ SOLN: SUBCUTANEOUS | 11 refills | Status: DC

## 2016-07-06 NOTE — Patient Instructions (Addendum)
1. Will start Novolog per sliding scale CBG<150 =No insulin 150-199= 2 units 200-249-=4 units 250-299 =6 units 300-34 = 8 units Greater than 350 =10 units  2. Will take Levemir 60 units at night with a high protein bedtime snack  3. Will reduce spironolactone to 12.5 mg due to low blood pressure. Will defer to cardiology for further medication adjustments  4. Check blood sugars before meals and at bedtime, maintain a journal and bring to follow up in 1 month  5. Will send a referral for home health evaluation. Also, contact social services for further assistance. Diabetes Mellitus and Food It is important for you to manage your blood sugar (glucose) level. Your blood glucose level can be greatly affected by what you eat. Eating healthier foods in the appropriate amounts throughout the day at about the same time each day will help you control your blood glucose level. It can also help slow or prevent worsening of your diabetes mellitus. Healthy eating may even help you improve the level of your blood pressure and reach or maintain a healthy weight. General recommendations for healthful eating and cooking habits include:  Eating meals and snacks regularly. Avoid going long periods of time without eating to lose weight.  Eating a diet that consists mainly of plant-based foods, such as fruits, vegetables, nuts, legumes, and whole grains.  Using low-heat cooking methods, such as baking, instead of high-heat cooking methods, such as deep frying. Work with your dietitian to make sure you understand how to use the Nutrition Facts information on food labels. How can food affect me? Carbohydrates  Carbohydrates affect your blood glucose level more than any other type of food. Your dietitian will help you determine how many carbohydrates to eat at each meal and teach you how to count carbohydrates. Counting carbohydrates is important to keep your blood glucose at a healthy level, especially if you are  using insulin or taking certain medicines for diabetes mellitus. Alcohol  Alcohol can cause sudden decreases in blood glucose (hypoglycemia), especially if you use insulin or take certain medicines for diabetes mellitus. Hypoglycemia can be a life-threatening condition. Symptoms of hypoglycemia (sleepiness, dizziness, and disorientation) are similar to symptoms of having too much alcohol. If your health care provider has given you approval to drink alcohol, do so in moderation and use the following guidelines:  Women should not have more than one drink per day, and men should not have more than two drinks per day. One drink is equal to:  12 oz of beer.  5 oz of wine.  1 oz of hard liquor.  Do not drink on an empty stomach.  Keep yourself hydrated. Have water, diet soda, or unsweetened iced tea.  Regular soda, juice, and other mixers might contain a lot of carbohydrates and should be counted. What foods are not recommended? As you make food choices, it is important to remember that all foods are not the same. Some foods have fewer nutrients per serving than other foods, even though they might have the same number of calories or carbohydrates. It is difficult to get your body what it needs when you eat foods with fewer nutrients. Examples of foods that you should avoid that are high in calories and carbohydrates but low in nutrients include:  Trans fats (most processed foods list trans fats on the Nutrition Facts label).  Regular soda.  Juice.  Candy.  Sweets, such as cake, pie, doughnuts, and cookies.  Fried foods. What foods can I eat? Eat  nutrient-rich foods, which will nourish your body and keep you healthy. The food you should eat also will depend on several factors, including:  The calories you need.  The medicines you take.  Your weight.  Your blood glucose level.  Your blood pressure level.  Your cholesterol level. You should eat a variety of foods,  including:  Protein.  Lean cuts of meat.  Proteins low in saturated fats, such as fish, egg whites, and beans. Avoid processed meats.  Fruits and vegetables.  Fruits and vegetables that may help control blood glucose levels, such as apples, mangoes, and yams.  Dairy products.  Choose fat-free or low-fat dairy products, such as milk, yogurt, and cheese.  Grains, bread, pasta, and rice.  Choose whole grain products, such as multigrain bread, whole oats, and brown rice. These foods may help control blood pressure.  Fats.  Foods containing healthful fats, such as nuts, avocado, olive oil, canola oil, and fish. Does everyone with diabetes mellitus have the same meal plan? Because every person with diabetes mellitus is different, there is not one meal plan that works for everyone. It is very important that you meet with a dietitian who will help you create a meal plan that is just right for you. This information is not intended to replace advice given to you by your health care provider. Make sure you discuss any questions you have with your health care provider. Document Released: 03/15/2005 Document Revised: 11/24/2015 Document Reviewed: 05/15/2013 Elsevier Interactive Patient Education  2017 [employer]  Diabetic Nephropathy Diabetic nephropathy is kidney disease that is caused by diabetes. Kidneys are the organs that filter and clean your blood. Kidneys also get rid of body waste products and extra fluid. Diabetes can cause gradual kidney damage over many years. Diabetic nephropathy that continues to get worse can lead to kidney failure. What are the causes? This condition is caused by kidney damage from diabetes. What increases the risk? This condition is more likely to develop in people with diabetes who also have:  High blood pressure.  High blood glucose.  A family history of diabetic nephropathy.  A history of diabetes for many years.  A history of tobacco  use.  Certain inherited genes. What are the signs or symptoms? You may already have this condition before symptoms develop. Symptoms may include:  Swelling of your hands, feet, or ankles.  Weakness.  Poor appetite.  Nausea.  Confusion.  Fatigue.  Trouble sleeping. If nephropathy leads to kidney failure, symptoms may include:  Vomiting.  Shortness of breath.  Seizures.  Coma. How is this diagnosed? Usually, your health care provider can diagnose diabetic nephropathy from your symptoms and medical history. Your health care provider will also do a physical exam. It is important to diagnose this condition before symptoms develop so you may also have screening tests to look for any early signs of problems. These tests may include:  Yearly (annual) urine tests.  Urine collection over a 24-hour period to measure kidney function.  Blood tests that measure blood glucose levels and kidney function. Problems other than diabetes can damage kidneys. If screening tests show early kidney damage, but your health care provider suspects that it is caused by a different problem, you may have other tests, such as:  An ultrasound of your kidneys.  A procedure to take a sample of kidney tissue for testing (biopsy). How is this treated? The goal of treatment is to prevent or slow down damage to your kidneys by managing your  diabetes. To do this, it is important to control:  Your blood pressure. Your target blood pressure will be based on your age, the medicines you take, how long you have had diabetes, and any other medical conditions you have. Generally, the goal is to keep your blood pressure below 140/90. Medicines called ACE inhibitors may be used along with a water pill (diuretic) to help you control your blood pressure.  Your A1c (hemoglobin A1c, or HbA1c) level. This is a measurement of your average blood glucose level during the previous 2-3 months. You and your health care provider  should work to keep this number under 7.  Your blood lipids. If you have high cholesterol, you may need to take lipid-lowering drugs, such as statins. Other treatments may include:  Medicines, including insulin injections.  Lifestyle changes, such as:  Losing weight.  Quitting smoking (smoking cessation).  A diet, which may include:  Restrictions of salt (sodium), protein, and fluid intake.  Vitamin D supplements. If your disease progresses to end-stage kidney failure, treatment may include:  Dialysis. This is a procedure to filter your blood with a machine.  Kidney transplant. Follow these instructions at home:  Take over-the-counter and prescription medicines only as told by your health care provider.  Follow instructions from your health care provider about eating or drinking restrictions.  Do not use tobacco products, including cigarettes, chewing tobacco, and e-cigarettes. If you need help quitting, ask your health care provider.  Be physically active every day. Ask your health care provider what type of exercise is best for you.  Eat healthy foods, and eat healthy snacks between meals. Have 3 meals and 3 snacks each day.  Maintain a healthy weight.  Keep all follow-up visits as told by your health care provider. This is important. Contact a health care provider if:  You have trouble keeping your blood glucose in your goal range.  Your hands, feet, or ankles are swollen.  You feel weak, tired, or dizzy.  You have muscle spasms.  You have nausea or vomiting.  You feel tired all the time. Get help right away if:  You are very sleepy.  You have:  A seizure or convulsion.  Severe, painful muscle spasms.  Shortness of breath.  Chest pain.  You pass out. This information is not intended to replace advice given to you by your health care provider. Make sure you discuss any questions you have with your health care provider. Document Released: 07/08/2007  Document Revised: 11/24/2015 Document Reviewed: 02/07/2015 Elsevier Interactive Patient Education  2017 [employer].  Diabetes Mellitus and Skin Care Diabetes (diabetes mellitus) can lead to health problems over time, including skin problems. People with diabetes have a higher risk for many types of skin complications. This is because having poorly controlled blood sugar (glucose) levels can:  Damage nerves and blood vessels. This can result in decreased feeling in your legs and feet, which means you may not notice minor skin injuries that could lead to serious problems.  Reduce blood flow (circulation), which makes wounds heal more slowly and increases your risk of infection.  Cause areas of skin to become thick or discolored. What are some common skin conditions that affect people with diabetes? Diabetes often causes dry skin. It can also cause the skin on the feet to get thinner, break more easily, and heal more slowly. There are certain skin conditions that commonly affect people who have diabetes, such as:  Bacterial skin infections, such as styes, boils, infected hair  follicles, and infections of the skin around the nails.  Fungal skin infections. These are most common in areas where skin rubs together, such as in the armpits or under the breasts.  Open sores, especially on the feet.  Tissue death (gangrene). This can happen on your feet if a serious infection does not heal properly. Gangrene can cause the need for a foot or leg to be surgically removed (amputated). Diabetes can also cause the skin to change. You may develop:  Dark, velvety markings on the skin that usually appear on the face, neck, armpits, inner thighs, and groin (acanthosis nigricans). This typically affects people of African-American and American-Indian descent.  Red, raised, scar-like tissue that may itch, feel painful, or develop into a wound (necrobiosis lipoidica).  Blisters on feet, toes, hands, or  fingers.  Thickened, wax-like areas of skin that usually occur on the hands, forehead, or toes (digital sclerosis).  Brown or red ring-shaped or half-ring-shaped patches of skin on the ears or fingers (disseminated granuloma).  Pea-shaped yellow bumps that may be itchy and surrounded by a red ring (eruptive xanthomatosis). This usually affects the arms, feet, buttocks, and the top of the hands.  Round, discolored patches of tan skin that do not hurt or itch (diabetic dermopathy). These may look like age spots. What do I need to know about itchy skin? It is common for people with diabetes to have itchy skin caused by dryness. Frequent high blood glucose levels can cause itchiness, and poor circulation and certain skin infections can make dry, itchy skin worse. If you have itchy skin that is red or covered in a rash, this could be a sign of an allergic reaction to a medicine. If you have a rash or if your skin is very itchy, contact your health care provider. You may need help to manage your diabetes better, or you may need treatment for an infection. How can I prevent skin breakdown? When you have diabetes and you get a badly infected ulcer or sore that does not heal, your skin can break down, especially if you have poor circulation or are on bed rest. To prevent skin breakdown:  Keep your skin clean and dry. Wash your skin often. Do not use hot water.  Do not use any products that contain nicotine or tobacco, such as cigarettes and e-cigarettes. Smoking affects the body's ability to heal. If you need help quitting, ask your health care provider.  Check your skin every day for cuts, bruises, redness, blisters, or sores, especially on your feet. Tell your health care provider about any cuts, wounds, or sores you have, especially if they are healing slowly.  If you are on bed rest, try to change positions often. What else do I need to know about taking care of my skin?   To relieve dry skin and  itching:  Limit baths and showers to 5-10 minutes.  Bathe with lukewarm water instead of hot water.  Use mild soap and gentle skin cleansers. Do not use soap that is perfumed or harsh or dries your skin.  Put on lotion as soon as you finish bathing.  Make sure that your health care provider performs a visual foot exam at every medical visit.  Schedule a foot exam with your health care provider once every year. This exam includes an inspection of the structure and skin of your feet.  If you get a skin injury, such as a cut, blister, or sore, check the area every day for signs  of infection. Check for:  More redness, swelling, or pain.  More fluid or blood.  Warmth.  Pus or a bad smell. Contact a health care provider if:  You develop a cut or sore, especially on your feet.  You develop signs of infection after a skin injury.  Your blood glucose level is higher than 240 mg/dL (13.3 mmol/L) for 2 days in a row.  You have itchy skin that develops redness or a rash.  You have discolored areas of skin.  You have areas where your skin is changing, such as thickening or appearing shiny. This information is not intended to replace advice given to you by your health care provider. Make sure you discuss any questions you have with your health care provider. Document Released: 11/29/2015 Document Revised: 01/06/2016 Document Reviewed: 11/29/2015 Elsevier Interactive Patient Education  2017 [employer].

## 2016-07-06 NOTE — Progress Notes (Signed)
Subjective:    Patient ID: William [name], male    DOB: 1950-10-14, 66 y.o.   MRN: [id number]  HPI [name], a 66 year old male with a history of STEMI in 2017, CHF, diabetes mellitus type 2, hyperlipidemia, and hypertension presents accompanied by daughter to establish care. [name] was a patient of [facility], Dr. Nolene Ebbs prior to establishing care. Patient was recently admitted to inpatient services for osteomyelitis. Patient was found to have destruction of the distal phalanx and gangrene of the right forefoot. Patient had an angioplasty of the right lowe extremity. [name] had a right forefoot amputatiion.  He is followed by Dr. Sharol Given at [facility] and the wound center. He is also receiving dressing changes through [place]. He endorses pain to right foot described as shooting. Current pain intensity is 6/10. He is unable to ambulate without assistance. Patient arrived via wheelchair.  [name] has a history of uncontrolled type 2 diabetes mellitus with decreased circulation and diabetic neuropathy. He has been taking Levemir 50 units AC and 40 units HS. His blood sugars have been fluctuating and markedly elevated over the past several weeks. The range as been from 200-350. His daughter has been maintaining a journal. Symptoms: hyperglycemia, paresthesia of the feet and polyuria. Symptoms have gradually worsened. Patient denies hypoglycemia  and visual disturbances.  Evaluation to date has been included: hemoglobin A1C.  Patient also has a history of hypertension and CHF. He has decreased activity levels and is not adherent to a low fat, low sodium diet.  His daughter checks blood pressure at home and it has been decreased over the past week.    Past Medical History:  Diagnosis Date  . AKI (acute kidney injury) ([place])    With STEMI in 2017  . Chronic systolic CHF (congestive heart failure) ([place])   . Diabetes mellitus without complication ([place])   .  Hyperlipidemia   . Hypertension   . Paroxysmal atrial fibrillation ([name])   . STEMI (ST elevation myocardial infarction) ([place]) 2017    Social History   Social History  . Marital status: Married    Spouse name: Lavell Luster  . Number of children: N/A  . Years of education: N/A   Occupational History  . Not on file.   Social History Main Topics  . Smoking status: Former Smoker    Packs/day: 0.50    Types: Cigarettes    Quit date: 11/20/2015  . Smokeless tobacco: Never Used  . Alcohol use No  . Drug use: No  . Sexual activity: Not on file   Other Topics Concern  . Not on file   Social History Narrative  . No narrative on file   Immunization History  Administered Date(s) Administered  . Influenza,inj,Quad PF,36+ Mos 03/23/2016   Review of Systems  Constitutional: Positive for fatigue.  HENT: Negative.   Eyes: Negative.   Respiratory: Negative.   Cardiovascular: Negative.   Gastrointestinal: Negative.   Endocrine: Negative.  Negative for polydipsia, polyphagia and polyuria.  Genitourinary: Negative.   Musculoskeletal: Positive for gait problem and myalgias.  Skin: Negative.        Healing amputation to right lower extremity  Allergic/Immunologic: Positive for immunocompromised state.  Neurological: Positive for weakness and numbness (lower extremities).  Hematological: Negative.   Psychiatric/Behavioral: Negative.        Objective:   Physical Exam  Constitutional: He is oriented to person, place, and time.  Eyes: Conjunctivae are normal. Pupils are equal, round,  and reactive to light.  Neck: Normal range of motion. Neck supple.  Pulmonary/Chest: Effort normal.  Abdominal: Soft. Bowel sounds are normal.  Musculoskeletal:       Right knee: He exhibits decreased range of motion.       Left knee: He exhibits decreased range of motion.  Forefoot amputation to right forefoot.  Patient unable to ambulate without assistance.   Neurological: He is alert and oriented to  person, place, and time. He has normal reflexes.  Skin: Skin is warm and dry.  Right foot dressing, unable to assess. Will defer to wound care and orthopedics.   Psychiatric: He has a normal mood and affect. His behavior is normal. Judgment and thought content normal.       BP 98/60 (BP Location: Left Arm, Patient Position: Sitting, Cuff Size: Normal)   Pulse 76   Temp 97.3 F (36.3 C) (Oral)   Resp 14   Ht '5\' 5"'  (1.651 m)   Wt 141 lb (64 kg)   BMI 23.46 kg/m  Assessment & Plan:  1. Essential hypertension Hypotension. Will decrease spironolactone to 12.5 mg daily. Continue to check blood pressures consistently and maintain a journal. Will defer to cardiology for further evaluation and treatment. Follow up as scheduled.  - CBC with Differential - spironolactone (ALDACTONE) 25 MG tablet; Take 0.5 tablets (12.5 mg total) by mouth daily.  Dispense: 30 tablet; Refill: 3  2. Type 2 diabetes mellitus with complication, with long-term current use of insulin ([name]) Discussed medication regimen at length. Will start sliding scale Novolog. Will check blood sugars 3 times per day before meals and at bedtime. Will change Levemir to 60 units HS. Patient is to follow up in office in 1 month.  - POCT A1C - COMPLETE METABOLIC PANEL WITH GFR - CBC with Differential - insulin detemir (LEVEMIR) 100 UNIT/ML injection; Take 60 units at bedtime  Dispense: 10 mL; Refill: 11 - insulin aspart (NOVOLOG) 100 UNIT/ML FlexPen; Per sliding scale  Dispense: 15 mL; Refill: 11 - Lancets (ACCU-CHEK SOFT TOUCH) lancets; Use as instructed  Dispense: 100 each; Refill: 12 - glucose blood (ACCU-CHEK AVIVA) test strip; Use as instructed  Dispense: 100 each; Refill: 12 - Blood Glucose Monitoring Suppl (ACCU-CHEK AVIVA PLUS) w/Device KIT; 1 each by Does not apply route 4 (four) times daily -  before meals and at bedtime.  Dispense: 1 kit; Refill: 0  3. Hyperlipidemia, unspecified hyperlipidemia type Reviewed previous lipid  panel. Continue Crestor 40 mg every evening.   4. Chronic systolic HF (heart failure) ([place])  Reviewed previous echocardiogram LV EF 40-45 %. Patient to follow up with cardiology as scheduled.  5. CKD (chronic kidney disease) stage 3, GFR 30-59 ml/min Patient warrants a referral to nephrology.   6. Tobacco dependence Patient has decreased smoking since December 2017.   7. Neuropathy ([name]) Continue Gabapentin 300 mg HS and Mirapex 0.125 mg  8. Abnormality of gait Will send referral to home health for possible assistance with activitites of daily living. Patient will also need a home safety assessment and evaluation for skilled nursing - Ambulatory referral to [place]  9. S/P transmetatarsal amputation of foot, right ([place]) Follow up with orthopedics and wound care as scheduled - Ambulatory referral to Home Health  RTC: 1 month for DMII Greater than 50% of visit spent counseling on DMII Hollis,Lachina M, FNP

## 2016-07-09 ENCOUNTER — Ambulatory Visit (INDEPENDENT_AMBULATORY_CARE_PROVIDER_SITE_OTHER): Payer: Medicare Other | Admitting: Family

## 2016-07-09 ENCOUNTER — Other Ambulatory Visit: Payer: Self-pay

## 2016-07-09 ENCOUNTER — Encounter (INDEPENDENT_AMBULATORY_CARE_PROVIDER_SITE_OTHER): Payer: Self-pay | Admitting: Orthopedic Surgery

## 2016-07-09 VITALS — Ht 65.0 in | Wt 141.0 lb

## 2016-07-09 DIAGNOSIS — E118 Type 2 diabetes mellitus with unspecified complications: Secondary | ICD-10-CM

## 2016-07-09 DIAGNOSIS — Z794 Long term (current) use of insulin: Principal | ICD-10-CM

## 2016-07-09 DIAGNOSIS — Z89431 Acquired absence of right foot: Secondary | ICD-10-CM

## 2016-07-09 MED ORDER — OXYCODONE-ACETAMINOPHEN 5-325 MG PO TABS: 1.0000 | ORAL_TABLET | Freq: Three times a day (TID) | ORAL | 0 refills | Status: DC | PRN

## 2016-07-09 MED ORDER — INSULIN DETEMIR 100 UNIT/ML FLEXPEN: 60.0000 [IU] | PEN_INJECTOR | Freq: Every day | SUBCUTANEOUS | 11 refills | Status: DC

## 2016-07-09 NOTE — Telephone Encounter (Signed)
Refill for Levemir pen sent into pharmacy. Thanks!

## 2016-07-09 NOTE — [facility] Notes (Signed)
Office Visit Note   Patient: William [name]           Date of Birth: Sep 07, 1950           MRN: [id number] Visit Date: 07/09/2016              Requested by: Nolene Ebbs, MD [address] [place], [place] [address] PCP: Dorena Dew, FNP  Chief Complaint  Patient presents with  . Right Foot - Routine Post Op    Right transmetatarsal amputation 06/21/16. 18 days post op.    HPI: The patient is 66 year old gentleman who is seen today in follow-up status post right transmetatarsal amputation about 3 weeks ago. This is healing well. He has been nonweightbearing elevating the foot as much as possible.    Assessment & Plan: Visit Diagnoses:  1. S/P transmetatarsal amputation of foot, right ([place])     Plan: Sutures harvested today. Continue dry dressings and elevation daily continue nonweightbearing until next follow-up.  Follow-Up Instructions: Return in about 1 week (around 07/16/2016).   Ortho Exam Incision well approximated, healing well. There is scattered eschar along the incision. Minimal swelling noted drainage no surrounding erythema no sign of infection.  Imaging: No results found.  Orders:  No orders of the defined types were placed in this encounter.  No orders of the defined types were placed in this encounter.    Procedures: No procedures performed  Clinical Data: No additional findings.  Subjective: Review of Systems  Constitutional: Negative for chills and fever.  Musculoskeletal: Positive for gait problem.  Skin: Positive for wound.    Objective: Vital Signs: Ht 5\' 5"  (1.651 m)   Wt 141 lb (64 kg)   BMI 23.46 kg/m   Specialty Comments:  No specialty comments available.  PMFS History: Patient Active Problem List   Diagnosis Date Noted  . S/P transmetatarsal amputation of foot, right ([place]) 07/06/2016  . Abnormality of gait 07/06/2016  . CKD (chronic kidney disease) stage 3, GFR 30-59 ml/min   . Osteomyelitis ([place]) 06/21/2016  .  Sepsis, unspecified organism ([place]) 06/21/2016  . AKI (acute kidney injury) ([place]) 06/21/2016  . Anemia 06/21/2016  . Hyponatremia 06/21/2016  . Gangrene of toe of right foot ([place])   . Atherosclerosis of native artery of right lower extremity with gangrene ([place]) 06/05/2016  . Chronic systolic HF (heart failure) ([place]) 05/28/2016  . Hypoalbuminemia 03/21/2016  . Coronary artery disease due to lipid rich plaque 03/16/2016  . VSD (ventricular septal defect) 03/16/2016  . Hx of CABG   . Paroxysmal atrial fibrillation ([place]) 03/15/2016  . Erectile dysfunction associated with type 2 diabetes mellitus ([place]) 04/07/2012  . Mood disorder ([place]) 03/12/2011  . Essential hypertension 03/17/2010  . ONYCHOMYCOSIS, TOENAILS 01/03/2010  . NICOTINE ADDICTION 12/28/2008  . Hyperlipidemia 11/01/2008  . GERD 11/01/2008  . SHOULDER PAIN, LEFT, CHRONIC 11/01/2008  . NECK PAIN, CHRONIC 11/01/2008  . Type 2 diabetes mellitus with diabetic peripheral angiopathy and gangrene, with long-term current use of insulin ([place]) 09/23/2008   Past Medical History:  Diagnosis Date  . AKI (acute kidney injury) ([place])    With STEMI in 2017  . Chronic systolic CHF (congestive heart failure) ([place])   . Diabetes mellitus without complication ([place])   . Hyperlipidemia   . Hypertension   . Paroxysmal atrial fibrillation ([name])   . STEMI (ST elevation myocardial infarction) ([place]) 2017    Family History  Problem Relation Age of Onset  . Diabetes Maternal Grandmother   . Diabetes Mother   .  Aneurysm Mother   . Peripheral Artery Disease Mother   . Coronary artery disease Mother   . Peptic Ulcer Father     Past Surgical History:  Procedure Laterality Date  . AMPUTATION Right 03/23/2016   Procedure: 1st and 2nd Ray Amputation Right Foot;  Surgeon: Newt Minion, MD;  Location: [facility];  Service: Orthopedics;  Laterality: Right;  . AMPUTATION Right 06/21/2016   Procedure: RIGHT TRANSMETATARSAL AMPUTATION;  Surgeon: Newt Minion,  MD;  Location: [facility];  Service: Orthopedics;  Laterality: Right;  . CARDIAC CATHETERIZATION N/A 03/13/2016   Procedure: Right/Left Heart Cath and Coronary Angiography;  Surgeon: Sherren Mocha, MD;  Location: [facility];  Service: Cardiovascular;  Laterality: N/A;  . CARDIAC CATHETERIZATION N/A 03/13/2016   Procedure: IABP Insertion;  Surgeon: Sherren Mocha, MD;  Location: [facility];  Service: Cardiovascular;  Laterality: N/A;  . CORONARY ARTERY BYPASS GRAFT N/A 03/13/2016   Procedure: CORONARY ARTERY BYPASS GRAFTING (CABG) x 1 (SVG to OM) with EVH from [place];  Surgeon: Ivin Poot, MD;  Location: [facility];  Service: Open Heart Surgery;  Laterality: N/A;  . LOWER EXTREMITY ANGIOGRAM  05/02/2016   Procedure: Lower Extremity Angiogram;  Surgeon: Wellington Hampshire, MD;  Location: [facility];  Service: Cardiovascular;;  Limited left femoral runoff right femoral runoff  . PERIPHERAL VASCULAR CATHETERIZATION N/A 05/02/2016   Procedure: Abdominal Aortogram;  Surgeon: Wellington Hampshire, MD;  Location: [facility];  Service: Cardiovascular;  Laterality: N/A;  . PERIPHERAL VASCULAR CATHETERIZATION Right 05/02/2016   Procedure: Peripheral Vascular Balloon Angioplasty;  Surgeon: Wellington Hampshire, MD;  Location: [facility];  Service: Cardiovascular;  Laterality: Right;  SFA  . TEE WITHOUT CARDIOVERSION N/A 03/13/2016   Procedure: TRANSESOPHAGEAL ECHOCARDIOGRAM (TEE);  Surgeon: Ivin Poot, MD;  Location: [facility];  Service: Open Heart Surgery;  Laterality: N/A;  . VSD REPAIR N/A 03/13/2016   Procedure: VENTRICULAR SEPTAL DEFECT (VSD) REPAIR;  Surgeon: Ivin Poot, MD;  Location: [facility];  Service: Open Heart Surgery;  Laterality: N/A;   Social History   Occupational History  . Not on file.   Social History Main Topics  . Smoking status: Former Smoker    Packs/day: 0.50    Types: Cigarettes    Quit date: 11/20/2015  . Smokeless tobacco: Never  Used  . Alcohol use No  . Drug use: No  . Sexual activity: Not on file

## 2016-07-10 ENCOUNTER — Ambulatory Visit (INDEPENDENT_AMBULATORY_CARE_PROVIDER_SITE_OTHER): Payer: Medicare Other | Admitting: Pharmacist Clinician (PhC)/ Clinical Pharmacy Specialist

## 2016-07-10 ENCOUNTER — Ambulatory Visit (INDEPENDENT_AMBULATORY_CARE_PROVIDER_SITE_OTHER): Payer: Medicare Other | Admitting: Cardiovascular Disease

## 2016-07-10 ENCOUNTER — Ambulatory Visit: Payer: Medicare Other | Admitting: Cardiovascular Disease

## 2016-07-10 ENCOUNTER — Encounter: Payer: Self-pay | Admitting: Cardiovascular Disease

## 2016-07-10 VITALS — BP 110/58 | HR 84 | Ht 65.0 in | Wt 141.0 lb

## 2016-07-10 DIAGNOSIS — Z5181 Encounter for therapeutic drug level monitoring: Secondary | ICD-10-CM

## 2016-07-10 DIAGNOSIS — I480 Paroxysmal atrial fibrillation: Secondary | ICD-10-CM | POA: Diagnosis not present

## 2016-07-10 DIAGNOSIS — I2510 Atherosclerotic heart disease of native coronary artery without angina pectoris: Secondary | ICD-10-CM

## 2016-07-10 DIAGNOSIS — I5022 Chronic systolic (congestive) heart failure: Secondary | ICD-10-CM | POA: Diagnosis not present

## 2016-07-10 DIAGNOSIS — I739 Peripheral vascular disease, unspecified: Secondary | ICD-10-CM

## 2016-07-10 LAB — POCT INR: INR: 2.2

## 2016-07-10 MED ORDER — MIDODRINE HCL 2.5 MG PO TABS: 2.5000 mg | ORAL_TABLET | Freq: Three times a day (TID) | ORAL | 4 refills | Status: DC

## 2016-07-10 NOTE — [facility] Notes (Signed)
Cardiology Office Note   Date:  07/10/2016   ID:  [name], DOB [birth year], MRN [id number]  PCP:  Dorena Dew, FNP  Cardiologist:   [name], MD   Chief Complaint  Patient presents with  . 1 month follow up      History of Present Illness: William [name] is a 66 y.o. male who Is here today for a follow-up visit regarding peripheral arterial disease. He has known history of inferior myocardial infarction complicated by VSD in September, 2017. He is status post one-vessel CABG and VSD repair. He had postoperative atrial fibrillation and was started on anticoagulation. He had amputation of the right great toe while hospitalized for gangrene. The patient has known history of diabetes.  He is known to have peripheral arterial disease. Angiography in November showed no significant aortoiliac disease, significant right distal SFA stenosis and one-vessel runoff below the knee via the peroneal artery with reconstitution of the dorsalis pedis distally. I performed successful drug-coated balloon angioplasty of the right SFA.  He was hospitalized in December for osteomyelitis of the right foot. He underwent right transmetatarsal amputation without complications. The amputation site is healing nicely but still not completely closed.  In terms of cardiac status, he denies chest pain or shortness of breath. Most recent echocardiogram in September showed an EF of 40-45%.   Past Medical History:  Diagnosis Date  . AKI (acute kidney injury) ([place])    With STEMI in 2017  . Chronic systolic CHF (congestive heart failure) ([place])   . Diabetes mellitus without complication ([place])   . Hyperlipidemia   . Hypertension   . Paroxysmal atrial fibrillation ([name])   . STEMI (ST elevation myocardial infarction) ([place]) 2017    Past Surgical History:  Procedure Laterality Date  . AMPUTATION Right 03/23/2016   Procedure: 1st and 2nd Ray Amputation Right Foot;  Surgeon: Newt Minion, MD;  Location: [facility];  Service: Orthopedics;  Laterality: Right;  . AMPUTATION Right 06/21/2016   Procedure: RIGHT TRANSMETATARSAL AMPUTATION;  Surgeon: Newt Minion, MD;  Location: [facility];  Service: Orthopedics;  Laterality: Right;  . CARDIAC CATHETERIZATION N/A 03/13/2016   Procedure: Right/Left Heart Cath and Coronary Angiography;  Surgeon: Sherren Mocha, MD;  Location: [facility];  Service: Cardiovascular;  Laterality: N/A;  . CARDIAC CATHETERIZATION N/A 03/13/2016   Procedure: IABP Insertion;  Surgeon: Sherren Mocha, MD;  Location: [facility];  Service: Cardiovascular;  Laterality: N/A;  . CORONARY ARTERY BYPASS GRAFT N/A 03/13/2016   Procedure: CORONARY ARTERY BYPASS GRAFTING (CABG) x 1 (SVG to OM) with EVH from [place];  Surgeon: Ivin Poot, MD;  Location: [facility];  Service: Open Heart Surgery;  Laterality: N/A;  . LOWER EXTREMITY ANGIOGRAM  05/02/2016   Procedure: Lower Extremity Angiogram;  Surgeon: Wellington Hampshire, MD;  Location: [facility];  Service: Cardiovascular;;  Limited left femoral runoff right femoral runoff  . PERIPHERAL VASCULAR CATHETERIZATION N/A 05/02/2016   Procedure: Abdominal Aortogram;  Surgeon: Wellington Hampshire, MD;  Location: [facility];  Service: Cardiovascular;  Laterality: N/A;  . PERIPHERAL VASCULAR CATHETERIZATION Right 05/02/2016   Procedure: Peripheral Vascular Balloon Angioplasty;  Surgeon: Wellington Hampshire, MD;  Location: [facility];  Service: Cardiovascular;  Laterality: Right;  SFA  . TEE WITHOUT CARDIOVERSION N/A 03/13/2016   Procedure: TRANSESOPHAGEAL ECHOCARDIOGRAM (TEE);  Surgeon: Ivin Poot, MD;  Location: [facility];  Service: Open Heart Surgery;  Laterality: N/A;  .  VSD REPAIR N/A 03/13/2016   Procedure: VENTRICULAR SEPTAL DEFECT (VSD) REPAIR;  Surgeon: Ivin Poot, MD;  Location: [facility];  Service: Open Heart Surgery;  Laterality: N/A;     Current Outpatient Prescriptions  Medication Sig Dispense Refill   . aspirin EC 81 MG tablet Take 81 mg by mouth daily.    . bisacodyl (DULCOLAX) 5 MG EC tablet Take 2 tablets (10 mg total) by mouth daily as needed for moderate constipation. 30 tablet 0  . Blood Glucose Monitoring Suppl (ACCU-CHEK AVIVA PLUS) w/Device KIT 1 each by Does not apply route 4 (four) times daily -  before meals and at bedtime. 1 kit 0  . carvedilol (COREG) 3.125 MG tablet Take 1 tablet (3.125 mg total) by mouth 2 (two) times daily. 180 tablet 3  . digoxin (LANOXIN) 0.125 MG tablet Take 1 tablet (0.125 mg total) by mouth daily. 30 tablet 3  . gabapentin (NEURONTIN) 300 MG capsule Take 300 mg by mouth every morning.     [name] glucose blood (ACCU-CHEK AVIVA) test strip Use as instructed 100 each 12  . insulin aspart (NOVOLOG) 100 UNIT/ML FlexPen Per sliding scale 15 mL 11  . Insulin Detemir (LEVEMIR FLEXPEN) 100 UNIT/ML Pen Inject 60 Units into the skin daily at 10 pm. 15 mL 11  . Lancets (ACCU-CHEK SOFT TOUCH) lancets Use as instructed 100 each 12  . midodrine (PROAMATINE) 5 MG tablet Take 1 tablet (5 mg total) by mouth 3 (three) times daily with meals. 90 tablet 3  . nitroGLYCERIN (NITRODUR - DOSED IN MG/24 HR) 0.4 mg/hr patch Place 1 patch (0.4 mg total) onto the skin daily. 30 patch 3  . oxyCODONE (OXY IR/ROXICODONE) 5 MG immediate release tablet Take 1 tablet (5 mg total) by mouth every 4 (four) hours as needed for moderate pain. 30 tablet 0  . pantoprazole (PROTONIX) 40 MG tablet Take 40 mg by mouth daily.    . pramipexole (MIRAPEX) 0.125 MG tablet Take 0.125 mg by mouth at bedtime.    . rosuvastatin (CRESTOR) 40 MG tablet take 1 tablet by mouth once daily 90 tablet 0  . senna (SENOKOT) 8.6 MG TABS tablet Take 1-2 tablets by mouth daily as needed for mild constipation.    [name] spironolactone (ALDACTONE) 25 MG tablet Take 0.5 tablets (12.5 mg total) by mouth daily. 30 tablet 3  . torsemide (DEMADEX) 20 MG tablet Take 1 tablet (20 mg total) by mouth 3 (three) times a week. Every Mon, Wed  and Fri 30 tablet 3  . warfarin (COUMADIN) 5 MG tablet TAKE 1 TABLET DAILY, EXCEPT 1 AND 1/2 TABLETS ON MON WED AND FRI 34 tablet 0   No current facility-administered medications for this visit.     Allergies:   Morphine and related    Social History:  The patient  reports that he quit smoking about 7 months ago. His smoking use included Cigarettes. He smoked 0.50 packs per day. He has never used smokeless tobacco. He reports that he does not drink alcohol or use drugs.   Family History:  Not able to obtain due to distress.  ROS:  Please see the history of present illness.   Otherwise, review of systems are positive for none.   All other systems are reviewed and negative.    PHYSICAL EXAM: VS:  BP (!) 110/58   Pulse 84   Ht '5\' 5"'  (1.651 m)   Wt 141 lb (64 kg)   BMI 23.46 kg/m  , BMI Body mass  index is 23.46 kg/m. GEN: Well nourished, well developed, in no acute distress  HEENT: normal  Neck: no JVD, carotid bruits, or masses Cardiac: RRR; no  rubs, or gallops,no edema . No murmurs Respiratory:  clear to auscultation bilaterally, normal work of breathing GI: soft, nontender, nondistended, + BS MS: no deformity or atrophy  Skin: warm and dry, no rash Neuro:  Strength and sensation are intact Psych: euthymic mood, full affect  Vascular: Distal pulses are not palpable. Right foot amputation site is healing.  EKG:  EKG is not ordered today.    Recent Labs: 06/22/2016: Magnesium 2.3 07/06/2016: ALT 47; BUN 26; Creat 1.59; Hemoglobin 11.7; Platelets 289; Potassium 4.9; Sodium 138    Lipid Panel    Component Value Date/Time   CHOL 96 03/13/2016 1057   TRIG 146 03/13/2016 1057   HDL 23 (L) 03/13/2016 1057   CHOLHDL 4.2 03/13/2016 1057   VLDL 29 03/13/2016 1057   LDLCALC 44 03/13/2016 1057      Wt Readings from Last 3 Encounters:  07/10/16 141 lb (64 kg)  07/09/16 141 lb (64 kg)  07/06/16 141 lb (64 kg)      No flowsheet data found.    ASSESSMENT AND  PLAN:  1.  Peripheral arterial disease :  He is status post recent drug-coated balloon angioplasty of the right SFA with good results. Post procedure ABI was normal. The patient obviously has significant below the knee disease with only 1 vessel runoff.  He recently underwent right transmetatarsal amputation for osteomyelitis with good healing so far. Continue medical therapy. Revascularization of the anterior tibial artery via the retrograde approach can be considered if needed. Reevaluate in 2 months.   2. Chronic systolic heart failure: He appears to be euvolemic.   I elected to decrease the dose of Midodrine to 2.5 mg tid. Continue small dose carvedilol and spironolactone. I will consider stopping digoxin in the near future.  3.Postoperative atrial fibrillation: Currently in sinus rhythm.He is on anticoagulation with warfarin.  4. Coronary artery disease involving native coronary arteries without angina:  status post one-vessel CABG and VSD repair. Continue medical therapy.   Disposition:   follow-up with me in 2 month  Signed,  [name], MD  07/10/2016 10:59 AM    [place]

## 2016-07-10 NOTE — Patient Instructions (Signed)
Medication Instructions:  Your physician has recommended you make the following change in your medication:  1. DECREASE Midodrine to 2.5mg  take one tablet by mouth three times per day  Labwork: No new orders.   Testing/Procedures: No new orders.   Follow-Up: Your physician recommends that you schedule a follow-up appointment in: 2 MONTHS with Dr Fletcher Anon   Any Other Special Instructions Will Be Listed Below (If Applicable).     If you need a refill on your cardiac medications before your next appointment, please call your pharmacy.

## 2016-07-11 DIAGNOSIS — Z4781 Encounter for orthopedic aftercare following surgical amputation: Secondary | ICD-10-CM | POA: Diagnosis not present

## 2016-07-11 DIAGNOSIS — N183 Chronic kidney disease, stage 3 (moderate): Secondary | ICD-10-CM | POA: Diagnosis not present

## 2016-07-11 DIAGNOSIS — E1151 Type 2 diabetes mellitus with diabetic peripheral angiopathy without gangrene: Secondary | ICD-10-CM | POA: Diagnosis not present

## 2016-07-11 DIAGNOSIS — I130 Hypertensive heart and chronic kidney disease with heart failure and stage 1 through stage 4 chronic kidney disease, or unspecified chronic kidney disease: Secondary | ICD-10-CM | POA: Diagnosis not present

## 2016-07-11 DIAGNOSIS — E1122 Type 2 diabetes mellitus with diabetic chronic kidney disease: Secondary | ICD-10-CM | POA: Diagnosis not present

## 2016-07-11 DIAGNOSIS — I5022 Chronic systolic (congestive) heart failure: Secondary | ICD-10-CM | POA: Diagnosis not present

## 2016-07-13 ENCOUNTER — Ambulatory Visit (HOSPITAL_COMMUNITY)
Admission: RE | Admit: 2016-07-13 | Discharge: 2016-07-13 | Disposition: A | Discharge status: Home / Self Care | Payer: Medicare Other | Source: Ambulatory Visit | Attending: Internal Medicine | Admitting: Internal Medicine

## 2016-07-13 ENCOUNTER — Encounter (HOSPITAL_COMMUNITY): Payer: Self-pay | Admitting: Internal Medicine

## 2016-07-13 VITALS — BP 106/62 | HR 80

## 2016-07-13 DIAGNOSIS — I951 Orthostatic hypotension: Secondary | ICD-10-CM | POA: Insufficient documentation

## 2016-07-13 DIAGNOSIS — I480 Paroxysmal atrial fibrillation: Secondary | ICD-10-CM

## 2016-07-13 DIAGNOSIS — Z79899 Other long term (current) drug therapy: Secondary | ICD-10-CM | POA: Insufficient documentation

## 2016-07-13 DIAGNOSIS — Z4781 Encounter for orthopedic aftercare following surgical amputation: Secondary | ICD-10-CM | POA: Diagnosis not present

## 2016-07-13 DIAGNOSIS — E1151 Type 2 diabetes mellitus with diabetic peripheral angiopathy without gangrene: Secondary | ICD-10-CM | POA: Diagnosis not present

## 2016-07-13 DIAGNOSIS — Z7901 Long term (current) use of anticoagulants: Secondary | ICD-10-CM | POA: Diagnosis not present

## 2016-07-13 DIAGNOSIS — I2510 Atherosclerotic heart disease of native coronary artery without angina pectoris: Secondary | ICD-10-CM

## 2016-07-13 DIAGNOSIS — E785 Hyperlipidemia, unspecified: Secondary | ICD-10-CM | POA: Diagnosis not present

## 2016-07-13 DIAGNOSIS — I5022 Chronic systolic (congestive) heart failure: Secondary | ICD-10-CM

## 2016-07-13 DIAGNOSIS — E119 Type 2 diabetes mellitus without complications: Secondary | ICD-10-CM | POA: Insufficient documentation

## 2016-07-13 DIAGNOSIS — I252 Old myocardial infarction: Secondary | ICD-10-CM | POA: Insufficient documentation

## 2016-07-13 DIAGNOSIS — Z794 Long term (current) use of insulin: Secondary | ICD-10-CM | POA: Insufficient documentation

## 2016-07-13 DIAGNOSIS — Z7982 Long term (current) use of aspirin: Secondary | ICD-10-CM | POA: Insufficient documentation

## 2016-07-13 DIAGNOSIS — E1122 Type 2 diabetes mellitus with diabetic chronic kidney disease: Secondary | ICD-10-CM | POA: Diagnosis not present

## 2016-07-13 DIAGNOSIS — I130 Hypertensive heart and chronic kidney disease with heart failure and stage 1 through stage 4 chronic kidney disease, or unspecified chronic kidney disease: Secondary | ICD-10-CM | POA: Diagnosis not present

## 2016-07-13 DIAGNOSIS — Z951 Presence of aortocoronary bypass graft: Secondary | ICD-10-CM | POA: Insufficient documentation

## 2016-07-13 DIAGNOSIS — N183 Chronic kidney disease, stage 3 (moderate): Secondary | ICD-10-CM | POA: Diagnosis not present

## 2016-07-13 DIAGNOSIS — I110 Hypertensive heart disease with heart failure: Secondary | ICD-10-CM | POA: Diagnosis not present

## 2016-07-13 DIAGNOSIS — Z87891 Personal history of nicotine dependence: Secondary | ICD-10-CM | POA: Insufficient documentation

## 2016-07-13 NOTE — Patient Instructions (Signed)
Follow up with Echo in 6-8 weeks 

## 2016-07-13 NOTE — Addendum Note (Signed)
Encounter addended by: Kennieth Rad, RN on: 07/13/2016  2:35 PM<BR>    Actions taken: Visit diagnoses modified, Order list changed, Diagnosis association updated, Sign clinical note

## 2016-07-13 NOTE — [facility] Notes (Signed)
Advanced Heart Failure Clinic Note   PCP: Dr Pecolia Ades Primary HF Cardiologist: Dr Haroldine Laws Orthopedic: [name] Cardiac Surgeon: Dr Lawson Fiscal  HPI: [name] is a 66 year old with a history of CAD with inferior MI complicated by acute VSD and cardiogenic shock, S/P CABG x1 with VSD repair on 03/13/16, S/P R 1st and 2nd toe amputation 03/24/2016 followed by R transmetatarsal amputation 12/17, DMII, PAF, and hyperlipidemia.    Admitted September 2017 with chest pain. Inferior MI c/b acute VSD and cardiogenic shock required CABG x 1 with VSD repair on 03/13/2016. Post op course prolonged due to AF and cardiogenic shock due to RV failure  Slow wean off milrinone due low mixed venous saturation. Also had amputation of R 1st and 2nd toe for osteo.  Discharge weight was 150 pounds. He was not discharged on bb or ace with hypotension.   Admitted in 12/17 with sepsis due to R foot gangrene. Underwent R transmetatarsal amputation  He presents today for regular follow up. Doing well. In [facility] due to recent amputation. At last visit we added low-dose carvedilol. Recently saw Dr. Fletcher Anon and midodrine decreased. Still occasionally dizzy.Denies SOB/CP/PND/Orthopnea. No edema.  Has stopped smoking completely x 4 months. Taking torsemide 3x/week   ECHO 03/19/2016 EF 40-45%. RV normal  Labs 04/02/2016: K 4.2 Creatinine 1.41   Peripheral angioplasty and abdominal aortogram 05/02/16 with No significant aortoiliac disease, Significant right distal SFA stenosis, One-vessel runoff below the knee on the right via the peroneal artery which gives collaterals distally to reconstitutes part of the procedure tibial and dorsalis pedis,  Successful angioplasty followed by drug-coated balloon angioplasty of the right distal SFA.  ROS: All systems negative except as listed in HPI, PMH and Problem List.  SH:  Social History   Social History  . Marital status: Married    Spouse name: Lavell Luster  . Number of children: N/A  . Years  of education: N/A   Occupational History  . Not on file.   Social History Main Topics  . Smoking status: Former Smoker    Packs/day: 0.50    Types: Cigarettes    Quit date: 11/20/2015  . Smokeless tobacco: Never Used  . Alcohol use No  . Drug use: No  . Sexual activity: Not on file   Other Topics Concern  . Not on file   Social History Narrative  . No narrative on file    FH:  Family History  Problem Relation Age of Onset  . Diabetes Maternal Grandmother   . Diabetes Mother   . Aneurysm Mother   . Peripheral Artery Disease Mother   . Coronary artery disease Mother   . Peptic Ulcer Father     Past Medical History:  Diagnosis Date  . AKI (acute kidney injury) ([place])    With STEMI in 2017  . Chronic systolic CHF (congestive heart failure) ([place])   . Diabetes mellitus without complication ([place])   . Hyperlipidemia   . Hypertension   . Paroxysmal atrial fibrillation ([name])   . STEMI (ST elevation myocardial infarction) ([place]) 2017    Current Outpatient Prescriptions  Medication Sig Dispense Refill  . aspirin EC 81 MG tablet Take 81 mg by mouth daily.    . bisacodyl (DULCOLAX) 5 MG EC tablet Take 2 tablets (10 mg total) by mouth daily as needed for moderate constipation. 30 tablet 0  . Blood Glucose Monitoring Suppl (ACCU-CHEK AVIVA PLUS) w/Device KIT 1 each by Does not apply route 4 (four) times daily -  before meals and at bedtime. 1 kit 0  . carvedilol (COREG) 3.125 MG tablet Take 1 tablet (3.125 mg total) by mouth 2 (two) times daily. 180 tablet 3  . digoxin (LANOXIN) 0.125 MG tablet Take 1 tablet (0.125 mg total) by mouth daily. 30 tablet 3  . gabapentin (NEURONTIN) 300 MG capsule Take 300 mg by mouth every morning.     [name] glucose blood (ACCU-CHEK AVIVA) test strip Use as instructed 100 each 12  . insulin aspart (NOVOLOG) 100 UNIT/ML FlexPen Per sliding scale 15 mL 11  . Insulin Detemir (LEVEMIR FLEXPEN) 100 UNIT/ML Pen Inject 60 Units into the skin daily at 10 pm. 15  mL 11  . Lancets (ACCU-CHEK SOFT TOUCH) lancets Use as instructed 100 each 12  . midodrine (PROAMATINE) 2.5 MG tablet Take 1 tablet (2.5 mg total) by mouth 3 (three) times daily with meals. 90 tablet 4  . nitroGLYCERIN (NITRODUR - DOSED IN MG/24 HR) 0.4 mg/hr patch Place 1 patch (0.4 mg total) onto the skin daily. 30 patch 3  . oxyCODONE (OXY IR/ROXICODONE) 5 MG immediate release tablet Take 1 tablet (5 mg total) by mouth every 4 (four) hours as needed for moderate pain. 30 tablet 0  . pantoprazole (PROTONIX) 40 MG tablet Take 40 mg by mouth daily.    . pramipexole (MIRAPEX) 0.125 MG tablet Take 0.125 mg by mouth at bedtime.    . rosuvastatin (CRESTOR) 40 MG tablet take 1 tablet by mouth once daily 90 tablet 0  . senna (SENOKOT) 8.6 MG TABS tablet Take 1-2 tablets by mouth daily as needed for mild constipation.    [name] spironolactone (ALDACTONE) 25 MG tablet Take 0.5 tablets (12.5 mg total) by mouth daily. 30 tablet 3  . torsemide (DEMADEX) 20 MG tablet Take 1 tablet (20 mg total) by mouth 3 (three) times a week. Every Mon, Wed and Fri 30 tablet 3  . warfarin (COUMADIN) 5 MG tablet TAKE 1 TABLET DAILY, EXCEPT 1 AND 1/2 TABLETS ON MON WED AND FRI 34 tablet 0   No current facility-administered medications for this encounter.     Vitals:   07/13/16 1417  BP: 106/62  Pulse: 80  SpO2: 99%   Wt Readings from Last 3 Encounters:  07/10/16 141 lb (64 kg)  07/09/16 141 lb (64 kg)  07/06/16 141 lb (64 kg)     PHYSICAL EXAM:  General: NAD, Daughter present HEENT: Normal Neck: supple. JVP flat. Carotids 2+ bilaterally; no bruits. No thyromegaly or nodule noted.  Cor: PMI normal. Regualr . No M/G/R. Sternal scar.  Lungs: CTAB, normal effort.  Abdomen: soft, NT, ND, no HSM. No bruits or masses. +BS  Extremities: no cyanosis, clubbing, rash, edema R foot s/p transmet amp wrapped  Neuro: alert & orientedx3, cranial nerves grossly intact. Moves all 4 extremities w/o difficulty. Affect  pleasant.   ASSESSMENT & PLAN: 1. Inerior STEMI CAD-->S/P CABG x1 VSD Repair with RV failure in 9/17 EF 40-45% on 9/17 echo.  On statin and aspirin. Unable to go to CR with PAD and healing wounds at this time.  2. Chronic Systolic Heart Failure- EF 40-45% on 9/17 echo.  NYHA II-III.  Volume status stable. Continue spiro 12.5 mg daily. Will not increase with k 4.9/ Continue digoxin 0.125 mg daily.  Level 0.5 at 03/2016. Continue coreg 3.125 mg BID. Continue midodrine for now. Would like to stop at next visit and then will try to add low dose losartan 12.5 daily  RTC in 6-8 weeks with  ECHO 3. PAD Ischemic R Great Toe s/p amputation 9/22 followed by R transmet amputation 12/17 4. Hyperlipidemia- Continue statin.  5. DM Type II-  Per PCP 6. PAF - Maintaining NSR. - On coumadin. INR followed by [facility].  7. RV failure 8. Orthostatic hypotension --Continue midodrine for now  Glori Bickers, MD 2:18 PM

## 2016-07-17 ENCOUNTER — Encounter (INDEPENDENT_AMBULATORY_CARE_PROVIDER_SITE_OTHER): Payer: Self-pay | Admitting: Family

## 2016-07-17 ENCOUNTER — Ambulatory Visit (INDEPENDENT_AMBULATORY_CARE_PROVIDER_SITE_OTHER): Payer: Medicare Other | Admitting: Family

## 2016-07-17 ENCOUNTER — Other Ambulatory Visit (INDEPENDENT_AMBULATORY_CARE_PROVIDER_SITE_OTHER): Payer: Self-pay

## 2016-07-17 VITALS — Ht 65.0 in | Wt 141.0 lb

## 2016-07-17 DIAGNOSIS — B351 Tinea unguium: Secondary | ICD-10-CM

## 2016-07-17 DIAGNOSIS — Z89431 Acquired absence of right foot: Secondary | ICD-10-CM

## 2016-07-17 NOTE — Progress Notes (Signed)
Office Visit Note   Patient: William [name]           Date of Birth: [birth year]           MRN: [id number] Visit Date: 07/17/2016              Requested by: Dorena Dew, FNP [address], [place] [address] PCP: Dorena Dew, FNP  Chief Complaint  Patient presents with  . Right Foot - Routine Post Op    Right transmetatarsal amputation 06/21/16. 26 days post op.    HPI: Patient presents for follow up right transmetatarsal amputation. He is doing well overall. His incision is well healed. He is currently nonweightbearing in wheelchair.   Daughter accompanies has found information for [employer] and would like to know about getting a foot prosthetic    Assessment & Plan: Visit Diagnoses:  1. ONYCHOMYCOSIS, TOENAILS   2. S/P transmetatarsal amputation of foot, right ([place])     Plan: may resume weightbearing as tolerated. Advance activities as tolerated. Feels would benefit from therapy, advanced who is re: doing wound care will be provided with orders for gait training. Have provided an order to [employer] for a spacer with a carbon fiber plate custom orthotics and extra-depth shoes. They will continue with daily wound cleansing and dry dressings. Follow-up in office with [place] in 4 weeks.  Follow-Up Instructions: Return in about 4 weeks (around 08/14/2016).   Ortho Exam right transmetatarsal amputation is healing well. There are no gaping areas. Does have a little bit of eschar over the incision. Minimal swelling to the foot. No drainage no surrounding erythema odor or sign of infection.   Left foot: Has thickened and discolored onychomycotic nails 5 right foot. Unable to safely trim his own nails due to peripheral neuropathy. These were trimmed today in the office without incident.    Imaging: No results found.  Orders:  No orders of the defined types were placed in this encounter.  No orders of the defined types were placed in this encounter.    Procedures: No procedures performed  Clinical Data: No additional findings.  Subjective: Review of Systems  Objective: Vital Signs: Ht 5\' 5"  (1.651 m)   Wt 141 lb (64 kg)   BMI 23.46 kg/m   Specialty Comments:  No specialty comments available.  PMFS History: Patient Active Problem List   Diagnosis Date Noted  . S/P transmetatarsal amputation of foot, right ([place]) 07/06/2016  . Abnormality of gait 07/06/2016  . CKD (chronic kidney disease) stage 3, GFR 30-59 ml/min   . Osteomyelitis ([place]) 06/21/2016  . Sepsis, unspecified organism ([place]) 06/21/2016  . AKI (acute kidney injury) ([place]) 06/21/2016  . Anemia 06/21/2016  . Hyponatremia 06/21/2016  . Gangrene of toe of right foot ([place])   . Atherosclerosis of native artery of right lower extremity with gangrene ([place]) 06/05/2016  . Chronic systolic HF (heart failure) ([place]) 05/28/2016  . Hypoalbuminemia 03/21/2016  . Coronary artery disease due to lipid rich plaque 03/16/2016  . VSD (ventricular septal defect) 03/16/2016  . Hx of CABG   . Paroxysmal atrial fibrillation ([place]) 03/15/2016  . Erectile dysfunction associated with type 2 diabetes mellitus ([place]) 04/07/2012  . Mood disorder ([place]) 03/12/2011  . Essential hypertension 03/17/2010  . ONYCHOMYCOSIS, TOENAILS 01/03/2010  . NICOTINE ADDICTION 12/28/2008  . Hyperlipidemia 11/01/2008  . GERD 11/01/2008  . SHOULDER PAIN, LEFT, CHRONIC 11/01/2008  . NECK PAIN, CHRONIC 11/01/2008  . Type 2 diabetes mellitus with diabetic peripheral angiopathy  and gangrene, with long-term current use of insulin ([place]) 09/23/2008   Past Medical History:  Diagnosis Date  . AKI (acute kidney injury) ([place])    With STEMI in 2017  . Chronic systolic CHF (congestive heart failure) ([place])   . Diabetes mellitus without complication ([place])   . Hyperlipidemia   . Hypertension   . Paroxysmal atrial fibrillation ([name])   . STEMI (ST elevation myocardial infarction) ([place]) 2017    Family History  Problem  Relation Age of Onset  . Diabetes Maternal Grandmother   . Diabetes Mother   . Aneurysm Mother   . Peripheral Artery Disease Mother   . Coronary artery disease Mother   . Peptic Ulcer Father     Past Surgical History:  Procedure Laterality Date  . AMPUTATION Right 03/23/2016   Procedure: 1st and 2nd Ray Amputation Right Foot;  Surgeon: Newt Minion, MD;  Location: [facility];  Service: Orthopedics;  Laterality: Right;  . AMPUTATION Right 06/21/2016   Procedure: RIGHT TRANSMETATARSAL AMPUTATION;  Surgeon: Newt Minion, MD;  Location: [facility];  Service: Orthopedics;  Laterality: Right;  . CARDIAC CATHETERIZATION N/A 03/13/2016   Procedure: Right/Left Heart Cath and Coronary Angiography;  Surgeon: Sherren Mocha, MD;  Location: [facility];  Service: Cardiovascular;  Laterality: N/A;  . CARDIAC CATHETERIZATION N/A 03/13/2016   Procedure: IABP Insertion;  Surgeon: Sherren Mocha, MD;  Location: [facility];  Service: Cardiovascular;  Laterality: N/A;  . CORONARY ARTERY BYPASS GRAFT N/A 03/13/2016   Procedure: CORONARY ARTERY BYPASS GRAFTING (CABG) x 1 (SVG to OM) with EVH from [place];  Surgeon: Ivin Poot, MD;  Location: [facility];  Service: Open Heart Surgery;  Laterality: N/A;  . LOWER EXTREMITY ANGIOGRAM  05/02/2016   Procedure: Lower Extremity Angiogram;  Surgeon: Wellington Hampshire, MD;  Location: [facility];  Service: Cardiovascular;;  Limited left femoral runoff right femoral runoff  . PERIPHERAL VASCULAR CATHETERIZATION N/A 05/02/2016   Procedure: Abdominal Aortogram;  Surgeon: Wellington Hampshire, MD;  Location: [facility];  Service: Cardiovascular;  Laterality: N/A;  . PERIPHERAL VASCULAR CATHETERIZATION Right 05/02/2016   Procedure: Peripheral Vascular Balloon Angioplasty;  Surgeon: Wellington Hampshire, MD;  Location: [facility];  Service: Cardiovascular;  Laterality: Right;  SFA  . TEE WITHOUT CARDIOVERSION N/A 03/13/2016   Procedure:  TRANSESOPHAGEAL ECHOCARDIOGRAM (TEE);  Surgeon: Ivin Poot, MD;  Location: [facility];  Service: Open Heart Surgery;  Laterality: N/A;  . VSD REPAIR N/A 03/13/2016   Procedure: VENTRICULAR SEPTAL DEFECT (VSD) REPAIR;  Surgeon: Ivin Poot, MD;  Location: [facility];  Service: Open Heart Surgery;  Laterality: N/A;   Social History   Occupational History  . Not on file.   Social History Main Topics  . Smoking status: Former Smoker    Packs/day: 0.50    Types: Cigarettes    Quit date: 11/20/2015  . Smokeless tobacco: Never Used  . Alcohol use No  . Drug use: No  . Sexual activity: Not on file

## 2016-07-20 ENCOUNTER — Telehealth (INDEPENDENT_AMBULATORY_CARE_PROVIDER_SITE_OTHER): Payer: Self-pay | Admitting: Orthopedic Surgery

## 2016-07-20 DIAGNOSIS — Z4781 Encounter for orthopedic aftercare following surgical amputation: Secondary | ICD-10-CM | POA: Diagnosis not present

## 2016-07-20 DIAGNOSIS — N183 Chronic kidney disease, stage 3 (moderate): Secondary | ICD-10-CM | POA: Diagnosis not present

## 2016-07-20 DIAGNOSIS — E1122 Type 2 diabetes mellitus with diabetic chronic kidney disease: Secondary | ICD-10-CM | POA: Diagnosis not present

## 2016-07-20 DIAGNOSIS — E1151 Type 2 diabetes mellitus with diabetic peripheral angiopathy without gangrene: Secondary | ICD-10-CM | POA: Diagnosis not present

## 2016-07-20 DIAGNOSIS — I5022 Chronic systolic (congestive) heart failure: Secondary | ICD-10-CM | POA: Diagnosis not present

## 2016-07-20 DIAGNOSIS — I130 Hypertensive heart and chronic kidney disease with heart failure and stage 1 through stage 4 chronic kidney disease, or unspecified chronic kidney disease: Secondary | ICD-10-CM | POA: Diagnosis not present

## 2016-07-20 NOTE — Telephone Encounter (Signed)
[facility] calling for additional information for pt referral that was sent.   Sonia Side [phone]

## 2016-07-20 NOTE — Telephone Encounter (Signed)
Demo info sent to [facility] (faxed) advised that I was not sure if the pt's insurance would cover physical therapy for a transmet amputation but the pt has requested this and to let me know if there was anything else that I could do.

## 2016-07-21 ENCOUNTER — Other Ambulatory Visit (HOSPITAL_COMMUNITY): Payer: Self-pay | Admitting: Student

## 2016-07-25 ENCOUNTER — Other Ambulatory Visit (INDEPENDENT_AMBULATORY_CARE_PROVIDER_SITE_OTHER): Payer: Self-pay | Admitting: Orthopedic Surgery

## 2016-07-25 ENCOUNTER — Telehealth (INDEPENDENT_AMBULATORY_CARE_PROVIDER_SITE_OTHER): Payer: Self-pay | Admitting: Orthopedic Surgery

## 2016-07-25 MED ORDER — OXYCODONE-ACETAMINOPHEN 5-325 MG PO TABS: 1.0000 | ORAL_TABLET | Freq: Four times a day (QID) | ORAL | 0 refills | Status: DC | PRN

## 2016-07-25 NOTE — Telephone Encounter (Signed)
Pt daughter  requesting refill of oxycodone  Has 2 left  [phone]

## 2016-07-25 NOTE — Telephone Encounter (Signed)
rx written

## 2016-07-26 DIAGNOSIS — N183 Chronic kidney disease, stage 3 (moderate): Secondary | ICD-10-CM | POA: Diagnosis not present

## 2016-07-26 DIAGNOSIS — E1122 Type 2 diabetes mellitus with diabetic chronic kidney disease: Secondary | ICD-10-CM | POA: Diagnosis not present

## 2016-07-26 DIAGNOSIS — Z4781 Encounter for orthopedic aftercare following surgical amputation: Secondary | ICD-10-CM | POA: Diagnosis not present

## 2016-07-26 DIAGNOSIS — I130 Hypertensive heart and chronic kidney disease with heart failure and stage 1 through stage 4 chronic kidney disease, or unspecified chronic kidney disease: Secondary | ICD-10-CM | POA: Diagnosis not present

## 2016-07-26 DIAGNOSIS — E1151 Type 2 diabetes mellitus with diabetic peripheral angiopathy without gangrene: Secondary | ICD-10-CM | POA: Diagnosis not present

## 2016-07-26 DIAGNOSIS — I5022 Chronic systolic (congestive) heart failure: Secondary | ICD-10-CM | POA: Diagnosis not present

## 2016-07-26 NOTE — Telephone Encounter (Signed)
I called and spoke with his daughter [name] to advise rx will be at front desk for pick up.

## 2016-08-03 ENCOUNTER — Telehealth (INDEPENDENT_AMBULATORY_CARE_PROVIDER_SITE_OTHER): Payer: Self-pay | Admitting: Radiology

## 2016-08-03 DIAGNOSIS — I130 Hypertensive heart and chronic kidney disease with heart failure and stage 1 through stage 4 chronic kidney disease, or unspecified chronic kidney disease: Secondary | ICD-10-CM | POA: Diagnosis not present

## 2016-08-03 DIAGNOSIS — E1151 Type 2 diabetes mellitus with diabetic peripheral angiopathy without gangrene: Secondary | ICD-10-CM | POA: Diagnosis not present

## 2016-08-03 DIAGNOSIS — N183 Chronic kidney disease, stage 3 (moderate): Secondary | ICD-10-CM | POA: Diagnosis not present

## 2016-08-03 DIAGNOSIS — Z4781 Encounter for orthopedic aftercare following surgical amputation: Secondary | ICD-10-CM | POA: Diagnosis not present

## 2016-08-03 DIAGNOSIS — I5022 Chronic systolic (congestive) heart failure: Secondary | ICD-10-CM | POA: Diagnosis not present

## 2016-08-03 DIAGNOSIS — E1122 Type 2 diabetes mellitus with diabetic chronic kidney disease: Secondary | ICD-10-CM | POA: Diagnosis not present

## 2016-08-03 NOTE — Telephone Encounter (Signed)
Jerelyn called stating patient has new wounds on left foot I called pts daughter to sched appt and there was no answer-lmom that they needed to call our office Monday to get an appt to see dr. Sharol Given

## 2016-08-06 ENCOUNTER — Encounter (INDEPENDENT_AMBULATORY_CARE_PROVIDER_SITE_OTHER): Payer: Self-pay | Admitting: Orthopedic Surgery

## 2016-08-06 ENCOUNTER — Ambulatory Visit (INDEPENDENT_AMBULATORY_CARE_PROVIDER_SITE_OTHER): Payer: Medicare Other | Admitting: Orthopedic Surgery

## 2016-08-06 VITALS — Ht 65.0 in | Wt 141.0 lb

## 2016-08-06 DIAGNOSIS — L03032 Cellulitis of left toe: Secondary | ICD-10-CM | POA: Diagnosis not present

## 2016-08-06 MED ORDER — CEPHALEXIN 500 MG PO CAPS: 500.0000 mg | ORAL_CAPSULE | Freq: Three times a day (TID) | ORAL | 0 refills | Status: DC

## 2016-08-06 NOTE — Telephone Encounter (Signed)
Pt is here for  appt today.

## 2016-08-06 NOTE — Progress Notes (Signed)
Office Visit Note   Patient: William [name]           Date of Birth: 11-18-1950           MRN: [id number] Visit Date: 08/06/2016              Requested by: Dorena Dew, FNP [address] [place], [place] [address] PCP: Dorena Dew, FNP  Chief Complaint  Patient presents with  . Left Foot - Wound Check    HPI: Patient presents for evaluation of both feet. Patient has right transmetatarsal amputation. He is 6 weeks 4 days post op. This is doing well. His blood sugar from 280's-586.  His left great toenail questionable infection. He denies fever, chills, nausea. There is redness. He is nonweightbearing in wheelchair. Junie Panning had advised that patient may lose left great toenail. Maxcine Ham, RT    Assessment & Plan: Visit Diagnoses:  1. Paronychia of great toe, left     Plan: Patient is given a prescription for Keflex for 10 days. Start antibiotic ointment dressing changes tomorrow where the postoperative shoe reevaluate in 3 weeks.  Follow-Up Instructions: Return in about 3 weeks (around 08/27/2016).   Ortho Exam Examination patient is alert oriented no adenopathy well-dressed normal affect normal respiratory effort. He has a well-healing transmetatarsal amputation the right. Examination the left foot he has a paronychial infection with drainage and cellulitis. He has a palpable pulse.  After informed consent and sterile prepping patient underwent a digital block with 10 mL 1% lidocaine plain the nail plate was removed without complications the infection was decompressed. A sterile dressing was applied. Prescription sent to his pharmacy for Keflex for 10 days. Imaging: No results found.  Orders:  No orders of the defined types were placed in this encounter.  Meds ordered this encounter  Medications  . DISCONTD: cephALEXin (KEFLEX) 500 MG capsule    Sig: Take 1 capsule (500 mg total) by mouth 3 (three) times daily.    Dispense:  30 capsule    Refill:  0  .  cephALEXin (KEFLEX) 500 MG capsule    Sig: Take 1 capsule (500 mg total) by mouth 3 (three) times daily.    Dispense:  30 capsule    Refill:  0     Procedures: No procedures performed  Clinical Data: No additional findings.  Subjective: Review of Systems  Objective: Vital Signs: Ht 5\' 5"  (1.651 m)   Wt 141 lb (64 kg)   BMI 23.46 kg/m   Specialty Comments:  No specialty comments available.  PMFS History: Patient Active Problem List   Diagnosis Date Noted  . S/P transmetatarsal amputation of foot, right ([place]) 07/06/2016  . Abnormality of gait 07/06/2016  . CKD (chronic kidney disease) stage 3, GFR 30-59 ml/min   . Osteomyelitis ([place]) 06/21/2016  . Sepsis, unspecified organism ([place]) 06/21/2016  . AKI (acute kidney injury) ([place]) 06/21/2016  . Anemia 06/21/2016  . Hyponatremia 06/21/2016  . Gangrene of toe of right foot ([place])   . Atherosclerosis of native artery of right lower extremity with gangrene ([place]) 06/05/2016  . Chronic systolic HF (heart failure) ([place]) 05/28/2016  . Hypoalbuminemia 03/21/2016  . Coronary artery disease due to lipid rich plaque 03/16/2016  . VSD (ventricular septal defect) 03/16/2016  . Hx of CABG   . Paroxysmal atrial fibrillation ([place]) 03/15/2016  . Erectile dysfunction associated with type 2 diabetes mellitus ([place]) 04/07/2012  . Mood disorder ([place]) 03/12/2011  . Essential hypertension 03/17/2010  .  ONYCHOMYCOSIS, TOENAILS 01/03/2010  . NICOTINE ADDICTION 12/28/2008  . Hyperlipidemia 11/01/2008  . GERD 11/01/2008  . SHOULDER PAIN, LEFT, CHRONIC 11/01/2008  . NECK PAIN, CHRONIC 11/01/2008  . Type 2 diabetes mellitus with diabetic peripheral angiopathy and gangrene, with long-term current use of insulin ([place]) 09/23/2008   Past Medical History:  Diagnosis Date  . AKI (acute kidney injury) ([place])    With STEMI in 2017  . Chronic systolic CHF (congestive heart failure) ([place])   . Diabetes mellitus without complication ([place])   . Hyperlipidemia    . Hypertension   . Paroxysmal atrial fibrillation ([name])   . STEMI (ST elevation myocardial infarction) ([place]) 2017    Family History  Problem Relation Age of Onset  . Diabetes Maternal Grandmother   . Diabetes Mother   . Aneurysm Mother   . Peripheral Artery Disease Mother   . Coronary artery disease Mother   . Peptic Ulcer Father     Past Surgical History:  Procedure Laterality Date  . AMPUTATION Right 03/23/2016   Procedure: 1st and 2nd Ray Amputation Right Foot;  Surgeon: Newt Minion, MD;  Location: [facility];  Service: Orthopedics;  Laterality: Right;  . AMPUTATION Right 06/21/2016   Procedure: RIGHT TRANSMETATARSAL AMPUTATION;  Surgeon: Newt Minion, MD;  Location: [facility];  Service: Orthopedics;  Laterality: Right;  . CARDIAC CATHETERIZATION N/A 03/13/2016   Procedure: Right/Left Heart Cath and Coronary Angiography;  Surgeon: Sherren Mocha, MD;  Location: [facility];  Service: Cardiovascular;  Laterality: N/A;  . CARDIAC CATHETERIZATION N/A 03/13/2016   Procedure: IABP Insertion;  Surgeon: Sherren Mocha, MD;  Location: [facility];  Service: Cardiovascular;  Laterality: N/A;  . CORONARY ARTERY BYPASS GRAFT N/A 03/13/2016   Procedure: CORONARY ARTERY BYPASS GRAFTING (CABG) x 1 (SVG to OM) with EVH from [place];  Surgeon: Ivin Poot, MD;  Location: [facility];  Service: Open Heart Surgery;  Laterality: N/A;  . LOWER EXTREMITY ANGIOGRAM  05/02/2016   Procedure: Lower Extremity Angiogram;  Surgeon: Wellington Hampshire, MD;  Location: [facility];  Service: Cardiovascular;;  Limited left femoral runoff right femoral runoff  . PERIPHERAL VASCULAR CATHETERIZATION N/A 05/02/2016   Procedure: Abdominal Aortogram;  Surgeon: Wellington Hampshire, MD;  Location: [facility];  Service: Cardiovascular;  Laterality: N/A;  . PERIPHERAL VASCULAR CATHETERIZATION Right 05/02/2016   Procedure: Peripheral Vascular Balloon Angioplasty;  Surgeon: Wellington Hampshire,  MD;  Location: [facility];  Service: Cardiovascular;  Laterality: Right;  SFA  . TEE WITHOUT CARDIOVERSION N/A 03/13/2016   Procedure: TRANSESOPHAGEAL ECHOCARDIOGRAM (TEE);  Surgeon: Ivin Poot, MD;  Location: [facility];  Service: Open Heart Surgery;  Laterality: N/A;  . VSD REPAIR N/A 03/13/2016   Procedure: VENTRICULAR SEPTAL DEFECT (VSD) REPAIR;  Surgeon: Ivin Poot, MD;  Location: [facility];  Service: Open Heart Surgery;  Laterality: N/A;   Social History   Occupational History  . Not on file.   Social History Main Topics  . Smoking status: Former Smoker    Packs/day: 0.50    Types: Cigarettes    Quit date: 11/20/2015  . Smokeless tobacco: Never Used  . Alcohol use No  . Drug use: No  . Sexual activity: Not on file

## 2016-08-07 ENCOUNTER — Encounter: Payer: Self-pay | Admitting: Family Medicine

## 2016-08-07 ENCOUNTER — Ambulatory Visit (INDEPENDENT_AMBULATORY_CARE_PROVIDER_SITE_OTHER): Payer: Medicare Other | Admitting: Family Medicine

## 2016-08-07 VITALS — BP 104/62 | HR 72 | Temp 98.1°F | Resp 16 | Ht 65.0 in | Wt 147.0 lb

## 2016-08-07 DIAGNOSIS — I2510 Atherosclerotic heart disease of native coronary artery without angina pectoris: Secondary | ICD-10-CM | POA: Diagnosis not present

## 2016-08-07 DIAGNOSIS — E785 Hyperlipidemia, unspecified: Secondary | ICD-10-CM | POA: Diagnosis not present

## 2016-08-07 DIAGNOSIS — E1152 Type 2 diabetes mellitus with diabetic peripheral angiopathy with gangrene: Secondary | ICD-10-CM

## 2016-08-07 DIAGNOSIS — Z794 Long term (current) use of insulin: Secondary | ICD-10-CM

## 2016-08-07 DIAGNOSIS — I10 Essential (primary) hypertension: Secondary | ICD-10-CM | POA: Diagnosis not present

## 2016-08-07 DIAGNOSIS — R269 Unspecified abnormalities of gait and mobility: Secondary | ICD-10-CM | POA: Diagnosis not present

## 2016-08-07 LAB — CBC WITH DIFFERENTIAL/PLATELET
BASOS ABS: 0 {cells}/uL (ref 0–200)
Basophils Relative: 0 %
EOS PCT: 2 %
Eosinophils Absolute: 210 cells/uL (ref 15–500)
HCT: 35.3 % — ABNORMAL LOW (ref 38.5–50.0)
HEMOGLOBIN: 11.5 g/dL — AB (ref 13.2–17.1)
LYMPHS ABS: 1575 {cells}/uL (ref 850–3900)
Lymphocytes Relative: 15 %
MCH: 26.4 pg — AB (ref 27.0–33.0)
MCHC: 32.6 g/dL (ref 32.0–36.0)
MCV: 81 fL (ref 80.0–100.0)
MPV: 9.3 fL (ref 7.5–12.5)
Monocytes Absolute: 840 cells/uL (ref 200–950)
Monocytes Relative: 8 %
NEUTROS PCT: 75 %
Neutro Abs: 7875 cells/uL — ABNORMAL HIGH (ref 1500–7800)
Platelets: 212 10*3/uL (ref 140–400)
RBC: 4.36 MIL/uL (ref 4.20–5.80)
RDW: 17 % — ABNORMAL HIGH (ref 11.0–15.0)
WBC: 10.5 10*3/uL (ref 3.8–10.8)

## 2016-08-07 LAB — COMPLETE METABOLIC PANEL WITH GFR
ALK PHOS: 188 U/L — AB (ref 40–115)
ALT: 28 U/L (ref 9–46)
AST: 14 U/L (ref 10–35)
Albumin: 4 g/dL (ref 3.6–5.1)
BUN: 26 mg/dL — ABNORMAL HIGH (ref 7–25)
CALCIUM: 9.2 mg/dL (ref 8.6–10.3)
CO2: 28 mmol/L (ref 20–31)
Chloride: 97 mmol/L — ABNORMAL LOW (ref 98–110)
Creat: 1.44 mg/dL — ABNORMAL HIGH (ref 0.70–1.25)
GFR, EST AFRICAN AMERICAN: 58 mL/min — AB (ref >=60)
GFR, EST NON AFRICAN AMERICAN: 51 mL/min — AB (ref >=60)
GLUCOSE: 310 mg/dL — AB (ref 65–99)
POTASSIUM: 4.3 mmol/L (ref 3.5–5.3)
SODIUM: 133 mmol/L — AB (ref 135–146)
Total Bilirubin: 0.9 mg/dL (ref 0.2–1.2)
Total Protein: 6.6 g/dL (ref 6.1–8.1)

## 2016-08-07 LAB — POCT GLYCOSYLATED HEMOGLOBIN (HGB A1C): HEMOGLOBIN A1C: 9.3

## 2016-08-07 LAB — GLUCOSE, CAPILLARY: GLUCOSE-CAPILLARY: 338 mg/dL — AB (ref 65–99)

## 2016-08-07 NOTE — Progress Notes (Signed)
Subjective:    Patient ID: William [name], male    DOB: 1950-12-29, 66 y.o.   MRN: [id number]  HPI William [name], a 66 year old male with a history of STEMI in 2017, CHF, diabetes mellitus type 2, hyperlipidemia, and hypertension presents accompanied by daughter for a follow up of type 2 diabetes mellitus. Patient was previously admitted to inpatient services for osteomyelitis. Patient was found to have destruction of the distal phalanx and gangrene of the right forefoot. Patient had an angioplasty of the right lowe extremity. William [name] had a right forefoot amputatiion.  He continues to follow with Dr. Sharol Given at [facility]  He is also receiving dressing changes through [facility].  Current pain intensity is 6/10. He is unable to ambulate without assistance. Patient arrived via wheelchair.   William [name] has a history of uncontrolled type 2 diabetes mellitus with decreased circulation and diabetic neuropathy. He has been taking Levemir 60 units HS. His blood sugars have been fluctuating and markedly elevated over the past several weeks. The range as been from 200-300. His daughter continues to maintain a journal. Also, they have the patient's glucometer. Symptoms: hyperglycemia, paresthesia of the feet and polyuria. Symptoms have gradually worsened. Patient denies hypoglycemia  and visual disturbances.  Evaluation to date has been included: hemoglobin A1C.  Patient also has a history of hypertension. He has decreased activity levels and is not adherent to a low fat, low sodium diet.  His daughter checks blood pressure at home and it is managed by Dr. Marlyne Beards, cardiology.    Past Medical History:  Diagnosis Date  . AKI (acute kidney injury) ([name])    With STEMI in 2017  . Chronic systolic CHF (congestive heart failure) ([place])   . Diabetes mellitus without complication ([place])   . Hyperlipidemia   . Hypertension   . Paroxysmal atrial fibrillation ([name])   . STEMI (ST elevation  myocardial infarction) ([place]) 2017    Social History   Social History  . Marital status: Married    Spouse name: Lavell Luster  . Number of children: N/A  . Years of education: N/A   Occupational History  . Not on file.   Social History Main Topics  . Smoking status: Former Smoker    Packs/day: 0.50    Types: Cigarettes    Quit date: 11/20/2015  . Smokeless tobacco: Never Used  . Alcohol use No  . Drug use: No  . Sexual activity: Not on file   Other Topics Concern  . Not on file   Social History Narrative  . No narrative on file   Immunization History  Administered Date(s) Administered  . Influenza,inj,Quad PF,36+ Mos 03/23/2016   Review of Systems  Constitutional: Positive for fatigue.  HENT: Negative.   Eyes: Negative.   Respiratory: Negative.   Cardiovascular: Negative.   Gastrointestinal: Negative.   Endocrine: Positive for polyuria. Negative for polydipsia and polyphagia.  Genitourinary: Negative.   Musculoskeletal: Positive for gait problem and myalgias.  Skin: Negative.        Healing amputation to right lower extremity  Allergic/Immunologic: Positive for immunocompromised state.  Neurological: Positive for weakness and numbness (lower extremities).  Hematological: Negative.   Psychiatric/Behavioral: Negative.        Objective:   Physical Exam  Constitutional: He is oriented to person, place, and time.  Eyes: Conjunctivae are normal. Pupils are equal, round, and reactive to light.  Neck: Normal range of motion. Neck supple.  Pulmonary/Chest: Effort normal.  Abdominal:  Soft. Bowel sounds are normal.  Musculoskeletal:       Right knee: He exhibits decreased range of motion.       Left knee: He exhibits decreased range of motion.  Forefoot amputation   Patient ambulates with a cane.   Neurological: He is alert and oriented to person, place, and time. He has normal reflexes.  Skin: Skin is warm and dry.  Right foot dressing, unable to assess.    Psychiatric: He has a normal mood and affect. His behavior is normal. Judgment and thought content normal.       BP 104/62 (BP Location: Right Arm, Patient Position: Sitting, Cuff Size: Normal)   Pulse 72   Temp 98.1 F (36.7 C) (Oral)   Resp 16   Ht 5\' 5"  (1.651 m)   Wt 147 lb (66.7 kg)   BMI 24.46 kg/m  Assessment & Plan:  1. Type 2 diabetes mellitus with diabetic peripheral angiopathy and gangrene, with long-term current use of insulin ([name]) Hemoglobin a1c is 9.3. Attribute elevated post prandial CBGs to recent left toe infection. Will continue to monitor. Patient is also having difficulty with meal planning. Will send a referral to diabetes education for further assistance.   - Ambulatory referral to diabetic education - HgB A1c - CBC with Differential - COMPLETE METABOLIC PANEL WITH GFR  2. Hyperlipidemia, unspecified hyperlipidemia type Continue Crestor as previously prescribed. Patient is also on coumadin, he has an appointment with the coumadin clinic on 08/08/2016.   3. Essential hypertension Will defer to cardiology for further evaluation and treatment. Follow up as scheduled.    4. Abnormality of gait Patient continues to ambulate with cane and has a walker at home. Patient had home safety assessment and evaluation for skilled nursing    RTC: 1 week for DMII medication management.  Greater than 50% of visit spent counseling on DMII Asuka Dusseau M, FNP

## 2016-08-08 ENCOUNTER — Other Ambulatory Visit (HOSPITAL_COMMUNITY): Payer: Self-pay | Admitting: Cardiology

## 2016-08-08 ENCOUNTER — Ambulatory Visit (INDEPENDENT_AMBULATORY_CARE_PROVIDER_SITE_OTHER): Payer: Medicare Other | Admitting: Pharmacist

## 2016-08-08 DIAGNOSIS — I2510 Atherosclerotic heart disease of native coronary artery without angina pectoris: Secondary | ICD-10-CM

## 2016-08-08 DIAGNOSIS — Z5181 Encounter for therapeutic drug level monitoring: Secondary | ICD-10-CM

## 2016-08-08 DIAGNOSIS — I480 Paroxysmal atrial fibrillation: Secondary | ICD-10-CM | POA: Diagnosis not present

## 2016-08-08 LAB — POCT INR: INR: 1.8

## 2016-08-08 MED ORDER — CARVEDILOL 3.125 MG PO TABS: 3.1250 mg | ORAL_TABLET | Freq: Two times a day (BID) | ORAL | 3 refills | Status: DC

## 2016-08-09 ENCOUNTER — Telehealth (INDEPENDENT_AMBULATORY_CARE_PROVIDER_SITE_OTHER): Payer: Self-pay | Admitting: Orthopedic Surgery

## 2016-08-09 LAB — POCT URINALYSIS DIP (DEVICE)
Bilirubin Urine: NEGATIVE
Ketones, ur: NEGATIVE mg/dL
Leukocytes, UA: NEGATIVE
Nitrite: NEGATIVE
PH: 6.5 (ref 5.0–8.0)
PROTEIN: 100 mg/dL — AB
SPECIFIC GRAVITY, URINE: 1.015 (ref 1.005–1.030)
UROBILINOGEN UA: 0.2 mg/dL (ref 0.0–1.0)

## 2016-08-09 NOTE — Telephone Encounter (Signed)
Patient's daughter called regarding her father William [name].  He had his toenail removed on Monday.  This morning when she took the bandage off, she noticed that the top of his foot his red.  Needs to know if she needs to bring him back to the clinic or go to the ER [phone].  Thank you.

## 2016-08-09 NOTE — Telephone Encounter (Signed)
Please make an appt to see erin tomorrow thanks.

## 2016-08-10 ENCOUNTER — Ambulatory Visit (INDEPENDENT_AMBULATORY_CARE_PROVIDER_SITE_OTHER): Payer: Medicare Other | Admitting: Family

## 2016-08-10 DIAGNOSIS — I2510 Atherosclerotic heart disease of native coronary artery without angina pectoris: Secondary | ICD-10-CM | POA: Diagnosis not present

## 2016-08-10 DIAGNOSIS — L03032 Cellulitis of left toe: Secondary | ICD-10-CM | POA: Diagnosis not present

## 2016-08-10 NOTE — Progress Notes (Signed)
Office Visit Note   Patient: William [name]           Date of Birth: 1950/11/03           MRN: [id number] Visit Date: 08/10/2016              Requested by: Dorena Dew, FNP [address], [place] [address] PCP: Dorena Dew, FNP  No chief complaint on file.   HPI: The patient is a 66 year old gentleman who presents today for evaluation of redness and a rash over his left foot. He had a toenail excision on the fifth, Monday of this week and noticed the 6 began having redness over the toe. Begin taking Keflex that day. Has noticed decreasing redness over the day yesterday. Was concerned for possible reaction to the Keflex. Does have a rash over his distal him shin.    Assessment & Plan: Visit Diagnoses:  1. Cellulitis of left toe     Plan: he will continue the Keflex. Have provided a prescription for doxycycline which she will begin taking tomorrow should the redness not continue to resolve. He will follow up in office as scheduled. Have discussed return precautions length. Him continue with daily wound cleansing. They will stop using alcohol and peroxide and use antibacterial ointment and gauze.  Follow-Up Instructions: Return for as Scheduled.   Ortho Exam Physical Exam  Constitutional: Appears well-developed.  Head: Normocephalic.  Eyes: EOM are normal.  Neck: Normal range of motion.  Cardiovascular: Normal rate.   Pulmonary/Chest: Effort normal.  Neurological: Is alert.  Skin: Skin is warm.  Psychiatric: Has a normal mood and affect. there is some dermatitis over the distal left shin. This does not appear to be a drug rash. No pruritus.  Left foot: The great toenail has been excised. There is bleeding granulation tissue in the nail bed. Him nonviable skin was debrided from the medial distal toe. There is no purulence today. Does continue to have some erythema over the dorsum of the foot. This is a little warm. There is no pitting edema no ascending  cellulitis. Palpable abscess.  Imaging: No results found.  Orders:  No orders of the defined types were placed in this encounter.  No orders of the defined types were placed in this encounter.    Procedures: No procedures performed  Clinical Data: No additional findings.  Subjective: Review of Systems  Constitutional: Negative for chills and fever.  Skin: Positive for color change, rash and wound.    Objective: Vital Signs: There were no vitals taken for this visit.  Specialty Comments:  No specialty comments available.  PMFS History: Patient Active Problem List   Diagnosis Date Noted  . S/P transmetatarsal amputation of foot, right ([place]) 07/06/2016  . Abnormality of gait 07/06/2016  . CKD (chronic kidney disease) stage 3, GFR 30-59 ml/min   . Osteomyelitis ([place]) 06/21/2016  . Sepsis, unspecified organism ([place]) 06/21/2016  . AKI (acute kidney injury) ([place]) 06/21/2016  . Anemia 06/21/2016  . Hyponatremia 06/21/2016  . Gangrene of toe of right foot ([place])   . Atherosclerosis of native artery of right lower extremity with gangrene ([place]) 06/05/2016  . Chronic systolic HF (heart failure) ([place]) 05/28/2016  . Hypoalbuminemia 03/21/2016  . Coronary artery disease due to lipid rich plaque 03/16/2016  . VSD (ventricular septal defect) 03/16/2016  . Hx of CABG   . Paroxysmal atrial fibrillation ([place]) 03/15/2016  . Erectile dysfunction associated with type 2 diabetes mellitus ([place]) 04/07/2012  .  Mood disorder ([place]) 03/12/2011  . Essential hypertension 03/17/2010  . ONYCHOMYCOSIS, TOENAILS 01/03/2010  . NICOTINE ADDICTION 12/28/2008  . Hyperlipidemia 11/01/2008  . GERD 11/01/2008  . SHOULDER PAIN, LEFT, CHRONIC 11/01/2008  . NECK PAIN, CHRONIC 11/01/2008  . Type 2 diabetes mellitus with diabetic peripheral angiopathy and gangrene, with long-term current use of insulin ([place]) 09/23/2008   Past Medical History:  Diagnosis Date  . AKI (acute kidney injury) ([place])    With  STEMI in 2017  . Chronic systolic CHF (congestive heart failure) ([place])   . Diabetes mellitus without complication ([place])   . Hyperlipidemia   . Hypertension   . Paroxysmal atrial fibrillation ([name])   . STEMI (ST elevation myocardial infarction) ([place]) 2017    Family History  Problem Relation Age of Onset  . Diabetes Maternal Grandmother   . Diabetes Mother   . Aneurysm Mother   . Peripheral Artery Disease Mother   . Coronary artery disease Mother   . Peptic Ulcer Father     Past Surgical History:  Procedure Laterality Date  . AMPUTATION Right 03/23/2016   Procedure: 1st and 2nd Ray Amputation Right Foot;  Surgeon: Newt Minion, MD;  Location: [facility];  Service: Orthopedics;  Laterality: Right;  . AMPUTATION Right 06/21/2016   Procedure: RIGHT TRANSMETATARSAL AMPUTATION;  Surgeon: Newt Minion, MD;  Location: [facility];  Service: Orthopedics;  Laterality: Right;  . CARDIAC CATHETERIZATION N/A 03/13/2016   Procedure: Right/Left Heart Cath and Coronary Angiography;  Surgeon: Sherren Mocha, MD;  Location: [facility];  Service: Cardiovascular;  Laterality: N/A;  . CARDIAC CATHETERIZATION N/A 03/13/2016   Procedure: IABP Insertion;  Surgeon: Sherren Mocha, MD;  Location: [facility];  Service: Cardiovascular;  Laterality: N/A;  . CORONARY ARTERY BYPASS GRAFT N/A 03/13/2016   Procedure: CORONARY ARTERY BYPASS GRAFTING (CABG) x 1 (SVG to OM) with EVH from [facility];  Surgeon: Ivin Poot, MD;  Location: [facility];  Service: Open Heart Surgery;  Laterality: N/A;  . LOWER EXTREMITY ANGIOGRAM  05/02/2016   Procedure: Lower Extremity Angiogram;  Surgeon: Wellington Hampshire, MD;  Location: [facility];  Service: Cardiovascular;;  Limited left femoral runoff right femoral runoff  . PERIPHERAL VASCULAR CATHETERIZATION N/A 05/02/2016   Procedure: Abdominal Aortogram;  Surgeon: [place] Hampshire, MD;  Location: [facility];  Service: Cardiovascular;  Laterality: N/A;   . PERIPHERAL VASCULAR CATHETERIZATION Right 05/02/2016   Procedure: Peripheral Vascular Balloon Angioplasty;  Surgeon: [place] Hampshire, MD;  Location: [facility];  Service: Cardiovascular;  Laterality: Right;  SFA  . TEE WITHOUT CARDIOVERSION N/A 03/13/2016   Procedure: TRANSESOPHAGEAL ECHOCARDIOGRAM (TEE);  Surgeon: Ivin Poot, MD;  Location: [facility];  Service: Open Heart Surgery;  Laterality: N/A;  . VSD REPAIR N/A 03/13/2016   Procedure: VENTRICULAR SEPTAL DEFECT (VSD) REPAIR;  Surgeon: Ivin Poot, MD;  Location: [facility];  Service: Open Heart Surgery;  Laterality: N/A;   Social History   Occupational History  . Not on file.   Social History Main Topics  . Smoking status: Former Smoker    Packs/day: 0.50    Types: Cigarettes    Quit date: 11/20/2015  . Smokeless tobacco: Never Used  . Alcohol use No  . Drug use: No  . Sexual activity: Not on file

## 2016-08-15 ENCOUNTER — Ambulatory Visit (INDEPENDENT_AMBULATORY_CARE_PROVIDER_SITE_OTHER): Payer: Medicare Other | Admitting: Family

## 2016-08-17 ENCOUNTER — Other Ambulatory Visit (INDEPENDENT_AMBULATORY_CARE_PROVIDER_SITE_OTHER): Payer: Medicare Other

## 2016-08-17 ENCOUNTER — Other Ambulatory Visit: Payer: Self-pay | Admitting: Family Medicine

## 2016-08-17 DIAGNOSIS — E1152 Type 2 diabetes mellitus with diabetic peripheral angiopathy with gangrene: Secondary | ICD-10-CM | POA: Diagnosis not present

## 2016-08-17 DIAGNOSIS — Z794 Long term (current) use of insulin: Secondary | ICD-10-CM

## 2016-08-17 DIAGNOSIS — E1365 Other specified diabetes mellitus with hyperglycemia: Secondary | ICD-10-CM

## 2016-08-17 DIAGNOSIS — E1349 Other specified diabetes mellitus with other diabetic neurological complication: Secondary | ICD-10-CM

## 2016-08-17 DIAGNOSIS — R7989 Other specified abnormal findings of blood chemistry: Secondary | ICD-10-CM

## 2016-08-17 DIAGNOSIS — IMO0002 Reserved for concepts with insufficient information to code with codable children: Secondary | ICD-10-CM

## 2016-08-17 LAB — CBC WITH DIFFERENTIAL/PLATELET
BASOS PCT: 0 %
Basophils Absolute: 0 cells/uL (ref 0–200)
Eosinophils Absolute: 396 cells/uL (ref 15–500)
Eosinophils Relative: 4 %
HCT: 37.9 % — ABNORMAL LOW (ref 38.5–50.0)
Hemoglobin: 12.1 g/dL — ABNORMAL LOW (ref 13.2–17.1)
LYMPHS PCT: 26 %
Lymphs Abs: 2574 cells/uL (ref 850–3900)
MCH: 26.5 pg — ABNORMAL LOW (ref 27.0–33.0)
MCHC: 31.9 g/dL — ABNORMAL LOW (ref 32.0–36.0)
MCV: 82.9 fL (ref 80.0–100.0)
MONOS PCT: 7 %
MPV: 9.1 fL (ref 7.5–12.5)
Monocytes Absolute: 693 cells/uL (ref 200–950)
Neutro Abs: 6237 cells/uL (ref 1500–7800)
Neutrophils Relative %: 63 %
PLATELETS: 262 10*3/uL (ref 140–400)
RBC: 4.57 MIL/uL (ref 4.20–5.80)
RDW: 17 % — AB (ref 11.0–15.0)
WBC: 9.9 10*3/uL (ref 3.8–10.8)

## 2016-08-17 LAB — BASIC METABOLIC PANEL
BUN: 32 mg/dL — AB (ref 7–25)
CALCIUM: 9.9 mg/dL (ref 8.6–10.3)
CO2: 25 mmol/L (ref 20–31)
CREATININE: 1.57 mg/dL — AB (ref 0.70–1.25)
Chloride: 102 mmol/L (ref 98–110)
Glucose, Bld: 264 mg/dL — ABNORMAL HIGH (ref 65–99)
Potassium: 4.9 mmol/L (ref 3.5–5.3)
Sodium: 139 mmol/L (ref 135–146)

## 2016-08-18 LAB — MICROALBUMIN / CREATININE URINE RATIO
CREATININE, URINE: 51 mg/dL (ref 20–370)
MICROALB UR: 8.1 mg/dL
MICROALB/CREAT RATIO: 159 ug/mg{creat} — AB (ref <30)

## 2016-08-19 ENCOUNTER — Other Ambulatory Visit (HOSPITAL_COMMUNITY): Payer: Self-pay | Admitting: Student

## 2016-08-20 ENCOUNTER — Encounter (HOSPITAL_COMMUNITY): Payer: Self-pay | Admitting: Family Medicine

## 2016-08-20 ENCOUNTER — Other Ambulatory Visit: Payer: Self-pay | Admitting: Family Medicine

## 2016-08-20 NOTE — Progress Notes (Signed)
Mailed letter with [facility] to pt ... KJ  °

## 2016-08-21 ENCOUNTER — Telehealth (HOSPITAL_COMMUNITY): Payer: Self-pay | Admitting: Cardiology

## 2016-08-21 ENCOUNTER — Telehealth: Payer: Self-pay | Admitting: Cardiovascular Disease

## 2016-08-21 MED ORDER — WARFARIN SODIUM 5 MG PO TABS: ORAL_TABLET | ORAL | 2 refills | Status: DC

## 2016-08-21 NOTE — Telephone Encounter (Signed)
New Message    PLEASE CALL WHEN THIS IS CALLED IN PHARMACY ONLY OPEN TO 5PM DUE TO CONSTRUCTION   Pt is completely out of    *STAT* If patient is at the pharmacy, call can be transferred to refill team.   1. Which medications need to be refilled? (please list name of each medication and dose if known) warfarin  2. Which pharmacy/location (including street and city if local pharmacy) is medication to be sent to [facility] [place]   3. Do they need a 30 day or 90 day supply? 30   warfarin (COUMADIN) 5 MG tablet take 1 tablet by mouth once daily EXCEPT 1 AND 1/2 TABLET ON MONDAY,WEDNESDAY AND FRIDAY   PLEASE CALL IN BEFORE 5PM

## 2016-08-21 NOTE — Telephone Encounter (Signed)
Rx sent, daughter notified

## 2016-08-21 NOTE — Telephone Encounter (Signed)
Patients daughter called very upset warfin was denied Advised daughter to medication should be managed/refilled by the providers who are managing his INR levels.  Daughter became very upset that she had to refill this every month and again advised normally they do not put multiple refills on warfin as his inr levels should be checked at intervals set by the coumadin clinic (Dr Theone Stanley office)  Pt daughter then reports " I put this on my soul that if something happens to my father tonight I will sue [place] for doing my father wrong". [name] was again given number and information to Dr Theone Stanley office and advised that I was almost certain this is something that could be addressed by the end of business today

## 2016-08-23 ENCOUNTER — Encounter: Payer: Self-pay | Admitting: Dietician

## 2016-08-23 ENCOUNTER — Encounter: Payer: Medicare Other | Attending: Family Medicine | Admitting: Dietician

## 2016-08-23 VITALS — Ht 65.0 in | Wt 145.9 lb

## 2016-08-23 DIAGNOSIS — E1152 Type 2 diabetes mellitus with diabetic peripheral angiopathy with gangrene: Secondary | ICD-10-CM | POA: Diagnosis not present

## 2016-08-23 DIAGNOSIS — Z794 Long term (current) use of insulin: Secondary | ICD-10-CM | POA: Diagnosis not present

## 2016-08-23 DIAGNOSIS — Z713 Dietary counseling and surveillance: Secondary | ICD-10-CM | POA: Diagnosis not present

## 2016-08-23 NOTE — Progress Notes (Signed)
  Type II Diabetes Medical Nutrition Therapy: Visit start time: 0800  end time: 0900  Assessment:  Diagnosis: DM type II Past medical history: CKD stg 3, anemia, sepsis, HLD, GERD, HTN, CAD s/p CABG, CHF Psychosocial issues/ stress concerns: none  Current weight: 145.9lbs Height: 5\' 5"  Medications, supplements: accucheck, coreg, novolog, levemir, senna, coumadin  Progress and evaluation: HgbA1c 9.3 (08/07/16). Per labs BG has been ranging 213-338. Family and patient report number rising into the 400s and 500s. Patient c/o not being able to keep blood sugars under control despite using the same methods that have worked in the past to keep his numbers in the 100s. Reports eating non-carbohydrate containing foods and seeing a rise in BG post meal. Family states they have cut out most carb sources though later it becomes apparent that knowledge of all foods/beverages that contain carbs is limited. Nutrition education on foods that contain carbs, carb choices, daily carb needs for meals/ snacks and carb counting was provided. Have substituted regular pastas for pastas made with from vegetables. Patient does not consume sugar. Has even cut out artificial sweeteners though it was explained that they are not likely to cause a rise in blood glucose. Emphasis was put on eating regularly and at consistent times throughout the day to keep levels steady. Encouraged continued ingestion of HS snacks to prevent hypoglycemic events overnight. Calories and sugar in juice was reviewed. Patient and daughter explain that he has been under a lot of stress lately, and also experiences constant pain from his foot/toe which is infected. RD explained the possibility of elevated blood glucose as a result of stress and infection.  Physical activity: 10-15 minutes daily light activity  Dietary Intake:  Usual eating pattern includes 3 meals and 2 snacks per day. Dining out frequency: 0-1 meals per week.  Breakfast: eggs with spray  butter, berries, applesauce, grits (~1/3 cup), cereal with milk Snack: typically naps at this time Lunch: bologna, tuna fish + crackers, fruit, veggies, carrot "fries" Snack: cucumbers, apples Supper: green beans, spinach, peas (no canned), meat, veggie pasta Snack: peanut butter with milk Beverages: coffee with cream, water, juice (no orange)  Nutrition Care Education: Topics covered: nutrition for diabetes, carb counting, portion sizes Basic nutrition: basic food groups, general nutrition guidelines Advanced nutrition:  food label reading Diabetes:  goals for BGs, appropriate meal and snack schedule, Carb counting, appropriate carb intake and balance Other lifestyle changes:  benefits of making changes, readiness for change, identifying habits that need to change  Nutritional Diagnosis:  [place]-2.1 Inpaired nutrition utilization As related to DM type II.  As evidenced by HgbA1C 9.3 and neuropathy.  Intervention: Discussion as noted above. Nutrition education materials provided.  Education Materials given:  . General diet guidelines for Diabetes . Plate Planner . Sample meal pattern/ menus . Goals/ instructions  Learner/ who was taught:  . Patient  . Family member: Daughter  Level of understanding: [name] Verbalizes/ demonstrates competency  Demonstrated degree of understanding via:   Teach back Learning barriers: . None  Willingness to learn/ readiness for change: . Eager, change in progress  Monitoring and Evaluation:  Dietary intake, exercise, blood glucose levels, and body weight      follow up: in 4 week(s)

## 2016-08-23 NOTE — Patient Instructions (Signed)
-   Work on consuming 3-4 carbohydrate servings at each meal (23max), and 1-2 carbohydrate servings at each snack  - Eat consistently throughout the day! Have meals and snacks around the same time every day  - Practice portion sizes and become familiar with what servings of carbohydrates look like  - Work on managing stress and pain levels, can help lower blood sugar levels

## 2016-08-24 ENCOUNTER — Ambulatory Visit (INDEPENDENT_AMBULATORY_CARE_PROVIDER_SITE_OTHER): Payer: Medicare Other | Admitting: Pharmacist

## 2016-08-24 DIAGNOSIS — I2510 Atherosclerotic heart disease of native coronary artery without angina pectoris: Secondary | ICD-10-CM

## 2016-08-24 DIAGNOSIS — Z5181 Encounter for therapeutic drug level monitoring: Secondary | ICD-10-CM | POA: Diagnosis not present

## 2016-08-24 DIAGNOSIS — I480 Paroxysmal atrial fibrillation: Secondary | ICD-10-CM

## 2016-08-24 LAB — POCT INR: INR: 2.2

## 2016-08-27 ENCOUNTER — Ambulatory Visit (INDEPENDENT_AMBULATORY_CARE_PROVIDER_SITE_OTHER): Payer: Medicare Other | Admitting: Family

## 2016-08-27 ENCOUNTER — Encounter (INDEPENDENT_AMBULATORY_CARE_PROVIDER_SITE_OTHER): Payer: Self-pay | Admitting: Orthopedic Surgery

## 2016-08-27 DIAGNOSIS — L97521 Non-pressure chronic ulcer of other part of left foot limited to breakdown of skin: Secondary | ICD-10-CM

## 2016-08-27 DIAGNOSIS — I2510 Atherosclerotic heart disease of native coronary artery without angina pectoris: Secondary | ICD-10-CM

## 2016-08-27 DIAGNOSIS — L03032 Cellulitis of left toe: Secondary | ICD-10-CM

## 2016-08-27 MED ORDER — SULFAMETHOXAZOLE-TRIMETHOPRIM 800-160 MG PO TABS: 1.0000 | ORAL_TABLET | Freq: Two times a day (BID) | ORAL | 0 refills | Status: DC

## 2016-08-27 MED ORDER — NITROGLYCERIN 0.4 MG/HR TD PT24: 0.4000 mg | MEDICATED_PATCH | Freq: Every day | TRANSDERMAL | 3 refills | Status: DC

## 2016-08-27 MED ORDER — SSD 1 % EX CREA: TOPICAL_CREAM | Freq: Every day | CUTANEOUS | 1 refills | Status: DC

## 2016-08-27 NOTE — Progress Notes (Signed)
Office Visit Note   Patient: William [name]           Date of Birth: 1951/06/13           MRN: [id number] Visit Date: 08/27/2016              Requested by: Dorena Dew, FNP [address] [place], [place] [address] PCP: Dorena Dew, FNP  Chief Complaint  Patient presents with  . Left Great Toe - Follow-up    Toenail excision.    HPI: Patient is a 66 year old gentleman seen today in follow up for cellulitis and excision left great toenail. No drainage. They are concerned for ischemic changes to the tip of the toe.     Assessment & Plan: Visit Diagnoses:  1. Ulcer of toe of left foot, limited to breakdown of skin ([place])   2. Cellulitis of left toe     Plan: Have provided prescriptions for Silvadene, Bactrim and Nitro patches. Will continue with daily wound cleansing. Follow upin 2 weeks   Follow-Up Instructions: Return in about 2 weeks (around 09/10/2016).   Ortho Exam Left foot: Great toenail has been excised. There is exudative tissue in the wound bed. This continues to be quite tender. No surrounding cellulitis. No drainage. There are ischemic changes distally with eschar measuring 2 cm x 18mm. No sausage digit swelling. Does have a palpable DP pulse.   Imaging: No results found.  Orders:  No orders of the defined types were placed in this encounter.  Meds ordered this encounter  Medications  . sulfamethoxazole-trimethoprim (BACTRIM DS,SEPTRA DS) 800-160 MG tablet    Sig: Take 1 tablet by mouth 2 (two) times daily.    Dispense:  20 tablet    Refill:  0  . nitroGLYCERIN (NITRODUR - DOSED IN MG/24 HR) 0.4 mg/hr patch    Sig: Place 1 patch (0.4 mg total) onto the skin daily.    Dispense:  30 patch    Refill:  3  . SSD 1 % cream    Sig: Apply topically daily.    Dispense:  50 g    Refill:  1     Procedures: No procedures performed  Clinical Data: No additional findings.  Subjective: Review of Systems  Constitutional: Negative for chills and  fever.  Skin: Positive for wound. Negative for color change.    Objective: Vital Signs: There were no vitals taken for this visit.  Specialty Comments:  No specialty comments available.  PMFS History: Patient Active Problem List   Diagnosis Date Noted  . S/P transmetatarsal amputation of foot, right ([place]) 07/06/2016  . Abnormality of gait 07/06/2016  . CKD (chronic kidney disease) stage 3, GFR 30-59 ml/min   . Osteomyelitis ([place]) 06/21/2016  . Sepsis, unspecified organism ([place]) 06/21/2016  . AKI (acute kidney injury) ([place]) 06/21/2016  . Anemia 06/21/2016  . Hyponatremia 06/21/2016  . Gangrene of toe of right foot ([place])   . Atherosclerosis of native artery of right lower extremity with gangrene ([place]) 06/05/2016  . Chronic systolic HF (heart failure) ([place]) 05/28/2016  . Hypoalbuminemia 03/21/2016  . Coronary artery disease due to lipid rich plaque 03/16/2016  . VSD (ventricular septal defect) 03/16/2016  . Hx of CABG   . Paroxysmal atrial fibrillation ([place]) 03/15/2016  . Erectile dysfunction associated with type 2 diabetes mellitus ([place]) 04/07/2012  . Mood disorder ([place]) 03/12/2011  . Essential hypertension 03/17/2010  . ONYCHOMYCOSIS, TOENAILS 01/03/2010  . NICOTINE ADDICTION 12/28/2008  . Hyperlipidemia  11/01/2008  . GERD 11/01/2008  . SHOULDER PAIN, LEFT, CHRONIC 11/01/2008  . NECK PAIN, CHRONIC 11/01/2008  . Type 2 diabetes mellitus with diabetic peripheral angiopathy and gangrene, with long-term current use of insulin ([place]) 09/23/2008   Past Medical History:  Diagnosis Date  . AKI (acute kidney injury) ([place])    With STEMI in 2017  . Chronic systolic CHF (congestive heart failure) ([place])   . Diabetes mellitus without complication ([place])   . Hyperlipidemia   . Hypertension   . Paroxysmal atrial fibrillation ([name])   . STEMI (ST elevation myocardial infarction) ([place]) 2017    Family History  Problem Relation Age of Onset  . Diabetes Maternal Grandmother   . Diabetes  Mother   . Aneurysm Mother   . Peripheral Artery Disease Mother   . Coronary artery disease Mother   . Peptic Ulcer Father     Past Surgical History:  Procedure Laterality Date  . AMPUTATION Right 03/23/2016   Procedure: 1st and 2nd Ray Amputation Right Foot;  Surgeon: Newt Minion, MD;  Location: [facility];  Service: Orthopedics;  Laterality: Right;  . AMPUTATION Right 06/21/2016   Procedure: RIGHT TRANSMETATARSAL AMPUTATION;  Surgeon: Newt Minion, MD;  Location: [facility];  Service: Orthopedics;  Laterality: Right;  . CARDIAC CATHETERIZATION N/A 03/13/2016   Procedure: Right/Left Heart Cath and Coronary Angiography;  Surgeon: Sherren Mocha, MD;  Location: [facility];  Service: Cardiovascular;  Laterality: N/A;  . CARDIAC CATHETERIZATION N/A 03/13/2016   Procedure: IABP Insertion;  Surgeon: Sherren Mocha, MD;  Location: [facility];  Service: Cardiovascular;  Laterality: N/A;  . CORONARY ARTERY BYPASS GRAFT N/A 03/13/2016   Procedure: CORONARY ARTERY BYPASS GRAFTING (CABG) x 1 (SVG to OM) with EVH from [place];  Surgeon: Ivin Poot, MD;  Location: [facility];  Service: Open Heart Surgery;  Laterality: N/A;  . LOWER EXTREMITY ANGIOGRAM  05/02/2016   Procedure: Lower Extremity Angiogram;  Surgeon: [place] Hampshire, MD;  Location: [facility];  Service: Cardiovascular;;  Limited left femoral runoff right femoral runoff  . PERIPHERAL VASCULAR CATHETERIZATION N/A 05/02/2016   Procedure: Abdominal Aortogram;  Surgeon: [place] Hampshire, MD;  Location: [facility];  Service: Cardiovascular;  Laterality: N/A;  . PERIPHERAL VASCULAR CATHETERIZATION Right 05/02/2016   Procedure: Peripheral Vascular Balloon Angioplasty;  Surgeon: [place] Hampshire, MD;  Location: [facility];  Service: Cardiovascular;  Laterality: Right;  SFA  . TEE WITHOUT CARDIOVERSION N/A 03/13/2016   Procedure: TRANSESOPHAGEAL ECHOCARDIOGRAM (TEE);  Surgeon: Ivin Poot, MD;   Location: [facility];  Service: Open Heart Surgery;  Laterality: N/A;  . VSD REPAIR N/A 03/13/2016   Procedure: VENTRICULAR SEPTAL DEFECT (VSD) REPAIR;  Surgeon: Ivin Poot, MD;  Location: [facility];  Service: Open Heart Surgery;  Laterality: N/A;   Social History   Occupational History  . Not on file.   Social History Main Topics  . Smoking status: Former Smoker    Packs/day: 0.50    Types: Cigarettes    Quit date: 11/20/2015  . Smokeless tobacco: Never Used  . Alcohol use No  . Drug use: No  . Sexual activity: Not on file

## 2016-08-27 NOTE — Progress Notes (Deleted)
Office Visit Note   Patient: William [name]           Date of Birth: 04/05/1951           MRN: [id number] Visit Date: 08/27/2016              Requested by: Dorena Dew, FNP [address] [place], [place] [address] PCP: Dorena Dew, FNP  Chief Complaint  Patient presents with  . Left Great Toe - Follow-up    Toenail excision.    HPI: Patient is a 66 y.o male who presents today for follow up left great toenail excision. Patient is using antibiotic ointment and taking oral antibiotics. There is black eschar. There is no drainage. Patient complaining of soreness. Maxcine Ham, RT    Assessment & Plan: Visit Diagnoses: No diagnosis found.  Plan: ***  Follow-Up Instructions: No Follow-up on file.   Ortho Exam ***  Imaging: No results found.  Orders:  No orders of the defined types were placed in this encounter.  No orders of the defined types were placed in this encounter.    Procedures: No procedures performed  Clinical Data: No additional findings.  Subjective: Review of Systems  Objective: Vital Signs: There were no vitals taken for this visit.  Specialty Comments:  No specialty comments available.  PMFS History: Patient Active Problem List   Diagnosis Date Noted  . S/P transmetatarsal amputation of foot, right ([place]) 07/06/2016  . Abnormality of gait 07/06/2016  . CKD (chronic kidney disease) stage 3, GFR 30-59 ml/min   . Osteomyelitis ([place]) 06/21/2016  . Sepsis, unspecified organism ([place]) 06/21/2016  . AKI (acute kidney injury) ([place]) 06/21/2016  . Anemia 06/21/2016  . Hyponatremia 06/21/2016  . Gangrene of toe of right foot ([place])   . Atherosclerosis of native artery of right lower extremity with gangrene ([place]) 06/05/2016  . Chronic systolic HF (heart failure) ([place]) 05/28/2016  . Hypoalbuminemia 03/21/2016  . Coronary artery disease due to lipid rich plaque 03/16/2016  . VSD (ventricular septal defect) 03/16/2016  . Hx of  CABG   . Paroxysmal atrial fibrillation ([place]) 03/15/2016  . Erectile dysfunction associated with type 2 diabetes mellitus ([place]) 04/07/2012  . Mood disorder ([place]) 03/12/2011  . Essential hypertension 03/17/2010  . ONYCHOMYCOSIS, TOENAILS 01/03/2010  . NICOTINE ADDICTION 12/28/2008  . Hyperlipidemia 11/01/2008  . GERD 11/01/2008  . SHOULDER PAIN, LEFT, CHRONIC 11/01/2008  . NECK PAIN, CHRONIC 11/01/2008  . Type 2 diabetes mellitus with diabetic peripheral angiopathy and gangrene, with long-term current use of insulin ([place]) 09/23/2008   Past Medical History:  Diagnosis Date  . AKI (acute kidney injury) ([place])    With STEMI in 2017  . Chronic systolic CHF (congestive heart failure) ([place])   . Diabetes mellitus without complication ([place])   . Hyperlipidemia   . Hypertension   . Paroxysmal atrial fibrillation ([name])   . STEMI (ST elevation myocardial infarction) ([place]) 2017    Family History  Problem Relation Age of Onset  . Diabetes Maternal Grandmother   . Diabetes Mother   . Aneurysm Mother   . Peripheral Artery Disease Mother   . Coronary artery disease Mother   . Peptic Ulcer Father     Past Surgical History:  Procedure Laterality Date  . AMPUTATION Right 03/23/2016   Procedure: 1st and 2nd Ray Amputation Right Foot;  Surgeon: Newt Minion, MD;  Location: [facility];  Service: Orthopedics;  Laterality: Right;  . AMPUTATION Right 06/21/2016   Procedure:  RIGHT TRANSMETATARSAL AMPUTATION;  Surgeon: Newt Minion, MD;  Location: [facility];  Service: Orthopedics;  Laterality: Right;  . CARDIAC CATHETERIZATION N/A 03/13/2016   Procedure: Right/Left Heart Cath and Coronary Angiography;  Surgeon: Sherren Mocha, MD;  Location: [facility];  Service: Cardiovascular;  Laterality: N/A;  . CARDIAC CATHETERIZATION N/A 03/13/2016   Procedure: IABP Insertion;  Surgeon: Sherren Mocha, MD;  Location: [facility];  Service: Cardiovascular;  Laterality: N/A;  . CORONARY ARTERY BYPASS GRAFT N/A  03/13/2016   Procedure: CORONARY ARTERY BYPASS GRAFTING (CABG) x 1 (SVG to OM) with EVH from [facility] [place];  Surgeon: Ivin Poot, MD;  Location: [facility];  Service: Open Heart Surgery;  Laterality: N/A;  . LOWER EXTREMITY ANGIOGRAM  05/02/2016   Procedure: Lower Extremity Angiogram;  Surgeon: Wellington Hampshire, MD;  Location: [facility];  Service: Cardiovascular;;  Limited left femoral runoff right femoral runoff  . PERIPHERAL VASCULAR CATHETERIZATION N/A 05/02/2016   Procedure: Abdominal Aortogram;  Surgeon: Wellington Hampshire, MD;  Location: [facility];  Service: Cardiovascular;  Laterality: N/A;  . PERIPHERAL VASCULAR CATHETERIZATION Right 05/02/2016   Procedure: Peripheral Vascular Balloon Angioplasty;  Surgeon: Wellington Hampshire, MD;  Location: [facility];  Service: Cardiovascular;  Laterality: Right;  SFA  . TEE WITHOUT CARDIOVERSION N/A 03/13/2016   Procedure: TRANSESOPHAGEAL ECHOCARDIOGRAM (TEE);  Surgeon: Ivin Poot, MD;  Location: [facility];  Service: Open Heart Surgery;  Laterality: N/A;  . VSD REPAIR N/A 03/13/2016   Procedure: VENTRICULAR SEPTAL DEFECT (VSD) REPAIR;  Surgeon: Ivin Poot, MD;  Location: [facility];  Service: Open Heart Surgery;  Laterality: N/A;   Social History   Occupational History  . Not on file.   Social History Main Topics  . Smoking status: Former Smoker    Packs/day: 0.50    Types: Cigarettes    Quit date: 11/20/2015  . Smokeless tobacco: Never Used  . Alcohol use No  . Drug use: No  . Sexual activity: Not on file

## 2016-08-28 ENCOUNTER — Other Ambulatory Visit (INDEPENDENT_AMBULATORY_CARE_PROVIDER_SITE_OTHER): Payer: Self-pay

## 2016-08-28 ENCOUNTER — Telehealth (INDEPENDENT_AMBULATORY_CARE_PROVIDER_SITE_OTHER): Payer: Self-pay | Admitting: Orthopedic Surgery

## 2016-08-28 MED ORDER — TRAMADOL HCL 50 MG PO TABS: 50.0000 mg | ORAL_TABLET | Freq: Two times a day (BID) | ORAL | 0 refills | Status: DC

## 2016-08-28 MED ORDER — SULFAMETHOXAZOLE-TRIMETHOPRIM 800-160 MG PO TABS: 1.0000 | ORAL_TABLET | Freq: Two times a day (BID) | ORAL | 0 refills | Status: DC

## 2016-08-28 MED ORDER — SSD 1 % EX CREA: TOPICAL_CREAM | Freq: Every day | CUTANEOUS | 1 refills | Status: DC

## 2016-08-28 MED ORDER — NITROGLYCERIN 0.4 MG/HR TD PT24: 0.4000 mg | MEDICATED_PATCH | Freq: Every day | TRANSDERMAL | 3 refills | Status: DC

## 2016-08-28 NOTE — Telephone Encounter (Signed)
I called pt to advise that all three rx had been faxed in yesterday but wanted these sent to the [facility] and so these were refaxed. Per erin ok for rx for tramadol 50mg  1 po bid prn sever pain and this was called into pharm.

## 2016-08-28 NOTE — Telephone Encounter (Signed)
Patient called needing some RX sent to their pharmacy. It was an antibiotic, nitroglycerin patches, ointment and he was also asking for a pain reliever. CB # [phone]

## 2016-09-03 ENCOUNTER — Telehealth: Payer: Self-pay | Admitting: Pharmacist

## 2016-09-03 ENCOUNTER — Ambulatory Visit (INDEPENDENT_AMBULATORY_CARE_PROVIDER_SITE_OTHER): Payer: Medicare Other | Admitting: Pharmacist Clinician (PhC)/ Clinical Pharmacy Specialist

## 2016-09-03 DIAGNOSIS — I2510 Atherosclerotic heart disease of native coronary artery without angina pectoris: Secondary | ICD-10-CM | POA: Diagnosis not present

## 2016-09-03 DIAGNOSIS — Z5181 Encounter for therapeutic drug level monitoring: Secondary | ICD-10-CM | POA: Diagnosis not present

## 2016-09-03 DIAGNOSIS — I480 Paroxysmal atrial fibrillation: Secondary | ICD-10-CM

## 2016-09-03 LAB — POCT INR: INR: 2.8

## 2016-09-03 NOTE — Telephone Encounter (Signed)
-----   Message from Barkley Boards, RN sent at 08/31/2016  9:16 AM EST ----- Regarding: pt started on Bactrim Hey [place]up Ogas,  I wanted to let you know that Mr Weidert was started on Bactrim by ortho.  Lauren RN

## 2016-09-03 NOTE — Telephone Encounter (Signed)
Bactrim initiated on Friday 08/31/16 . Major drug-drug interaction.  Need INR today or tomorrow.  Talked to daughter [name]. Next INR appointment scheduled for today 09/03/16 at 10:30am

## 2016-09-10 ENCOUNTER — Encounter (HOSPITAL_COMMUNITY): Payer: Self-pay | Admitting: Internal Medicine

## 2016-09-10 ENCOUNTER — Ambulatory Visit (HOSPITAL_BASED_OUTPATIENT_CLINIC_OR_DEPARTMENT_OTHER)
Admission: RE | Admit: 2016-09-10 | Discharge: 2016-09-10 | Disposition: A | Discharge status: Home / Self Care | Payer: Medicare Other | Source: Ambulatory Visit | Attending: Internal Medicine | Admitting: Internal Medicine

## 2016-09-10 ENCOUNTER — Ambulatory Visit (HOSPITAL_COMMUNITY)
Admission: RE | Admit: 2016-09-10 | Discharge: 2016-09-10 | Disposition: A | Discharge status: Home / Self Care | Payer: Medicare Other | Source: Ambulatory Visit | Attending: Family Medicine | Admitting: Family Medicine

## 2016-09-10 VITALS — BP 118/62 | HR 81 | Wt 150.0 lb

## 2016-09-10 DIAGNOSIS — Z79899 Other long term (current) drug therapy: Secondary | ICD-10-CM | POA: Diagnosis not present

## 2016-09-10 DIAGNOSIS — Z89411 Acquired absence of right great toe: Secondary | ICD-10-CM | POA: Insufficient documentation

## 2016-09-10 DIAGNOSIS — I2510 Atherosclerotic heart disease of native coronary artery without angina pectoris: Secondary | ICD-10-CM | POA: Diagnosis not present

## 2016-09-10 DIAGNOSIS — Q210 Ventricular septal defect: Secondary | ICD-10-CM | POA: Insufficient documentation

## 2016-09-10 DIAGNOSIS — E119 Type 2 diabetes mellitus without complications: Secondary | ICD-10-CM | POA: Diagnosis not present

## 2016-09-10 DIAGNOSIS — Z7901 Long term (current) use of anticoagulants: Secondary | ICD-10-CM | POA: Insufficient documentation

## 2016-09-10 DIAGNOSIS — Z8249 Family history of ischemic heart disease and other diseases of the circulatory system: Secondary | ICD-10-CM | POA: Insufficient documentation

## 2016-09-10 DIAGNOSIS — Z833 Family history of diabetes mellitus: Secondary | ICD-10-CM | POA: Diagnosis not present

## 2016-09-10 DIAGNOSIS — I110 Hypertensive heart disease with heart failure: Secondary | ICD-10-CM | POA: Insufficient documentation

## 2016-09-10 DIAGNOSIS — I5022 Chronic systolic (congestive) heart failure: Secondary | ICD-10-CM

## 2016-09-10 DIAGNOSIS — I739 Peripheral vascular disease, unspecified: Secondary | ICD-10-CM

## 2016-09-10 DIAGNOSIS — Z951 Presence of aortocoronary bypass graft: Secondary | ICD-10-CM | POA: Diagnosis not present

## 2016-09-10 DIAGNOSIS — I252 Old myocardial infarction: Secondary | ICD-10-CM | POA: Diagnosis not present

## 2016-09-10 DIAGNOSIS — Z7982 Long term (current) use of aspirin: Secondary | ICD-10-CM | POA: Diagnosis not present

## 2016-09-10 DIAGNOSIS — Z794 Long term (current) use of insulin: Secondary | ICD-10-CM | POA: Diagnosis not present

## 2016-09-10 DIAGNOSIS — Z87891 Personal history of nicotine dependence: Secondary | ICD-10-CM | POA: Diagnosis not present

## 2016-09-10 DIAGNOSIS — I480 Paroxysmal atrial fibrillation: Secondary | ICD-10-CM | POA: Insufficient documentation

## 2016-09-10 DIAGNOSIS — E785 Hyperlipidemia, unspecified: Secondary | ICD-10-CM | POA: Insufficient documentation

## 2016-09-10 DIAGNOSIS — I951 Orthostatic hypotension: Secondary | ICD-10-CM | POA: Diagnosis not present

## 2016-09-10 LAB — BASIC METABOLIC PANEL
Anion gap: 8 (ref 5–15)
BUN: 28 mg/dL — ABNORMAL HIGH (ref 6–20)
CHLORIDE: 106 mmol/L (ref 101–111)
CO2: 25 mmol/L (ref 22–32)
Calcium: 9.1 mg/dL (ref 8.9–10.3)
Creatinine, Ser: 1.46 mg/dL — ABNORMAL HIGH (ref 0.61–1.24)
GFR calc Af Amer: 56 mL/min — ABNORMAL LOW (ref >60)
GFR, EST NON AFRICAN AMERICAN: 49 mL/min — AB (ref >60)
Glucose, Bld: 248 mg/dL — ABNORMAL HIGH (ref 65–99)
POTASSIUM: 4.5 mmol/L (ref 3.5–5.1)
SODIUM: 139 mmol/L (ref 135–145)

## 2016-09-10 LAB — ECHOCARDIOGRAM COMPLETE
LV SIMPSON'S DISK: 45
LV dias vol index: 63 mL/m2
LV dias vol: 110 mL (ref 62–150)
LV sys vol: 60 mL (ref 21–61)
LVSYSVOLIN: 34 mL/m2
Stroke v: 50 ml

## 2016-09-10 NOTE — Progress Notes (Signed)
Advanced Heart Failure Clinic Note   PCP: Dr Pecolia Ades Primary HF Cardiologist: Dr Haroldine Laws Orthopedic: [name] Cardiac Surgeon: Dr Lawson Fiscal  HPI: Mr Harbeson is a 66 year old with a history of CAD with inferior MI complicated by acute VSD and cardiogenic shock, S/P CABG x1 with VSD repair on 03/13/16, S/P R 1st and 2nd toe amputation 03/24/2016 followed by R transmetatarsal amputation 12/17, DMII, PAF, and hyperlipidemia.    Admitted September 2017 with chest pain. Inferior MI c/b acute VSD and cardiogenic shock required CABG x 1 with VSD repair on 03/13/2016. Post op course prolonged due to AF and cardiogenic shock due to RV failure  Slow wean off milrinone due low mixed venous saturation. Also had amputation of R 1st and 2nd toe for osteo.  Discharge weight was 150 pounds. He was not discharged on bb or ace with hypotension.   Admitted in 12/17 with sepsis due to R foot gangrene. Underwent R transmetatarsal amputation  He presents today for regular follow up. Doing well. Now out of WC and walking with cane. Blood sugars much improved. Recently had toenail removed. Feels very good. Doing midodrine 2.5 TID. No dizziness at all. No SOB or swelling.  Has stopped smoking completely x 6 months. Taking torsemide 3x/week on M/W/F   ECHO today EF 40-45% Inferior AK. VSD patch looks good. Mild MR. RV ok  ECHO 03/19/2016 EF 40-45%. RV normal  Labs 04/02/2016: K 4.2 Creatinine 1.41   Peripheral angioplasty and abdominal aortogram 05/02/16 with No significant aortoiliac disease, Significant right distal SFA stenosis, One-vessel runoff below the knee on the right via the peroneal artery which gives collaterals distally to reconstitutes part of the procedure tibial and dorsalis pedis,  Successful angioplasty followed by drug-coated balloon angioplasty of the right distal SFA.  ROS: All systems negative except as listed in HPI, PMH and Problem List.  SH:  Social History   Social History  . Marital  status: Married    Spouse name: Lavell Luster  . Number of children: N/A  . Years of education: N/A   Occupational History  . Not on file.   Social History Main Topics  . Smoking status: Former Smoker    Packs/day: 0.50    Types: Cigarettes    Quit date: 11/20/2015  . Smokeless tobacco: Never Used  . Alcohol use No  . Drug use: No  . Sexual activity: Not on file   Other Topics Concern  . Not on file   Social History Narrative  . No narrative on file    FH:  Family History  Problem Relation Age of Onset  . Diabetes Maternal Grandmother   . Diabetes Mother   . Aneurysm Mother   . Peripheral Artery Disease Mother   . Coronary artery disease Mother   . Peptic Ulcer Father     Past Medical History:  Diagnosis Date  . AKI (acute kidney injury) ([place])    With STEMI in 2017  . Chronic systolic CHF (congestive heart failure) ([place])   . Diabetes mellitus without complication ([place])   . Hyperlipidemia   . Hypertension   . Paroxysmal atrial fibrillation ([name])   . STEMI (ST elevation myocardial infarction) ([place]) 2017    Current Outpatient Prescriptions  Medication Sig Dispense Refill  . aspirin EC 81 MG tablet Take 81 mg by mouth daily.    . Blood Glucose Monitoring Suppl (ACCU-CHEK AVIVA PLUS) w/Device KIT 1 each by Does not apply route 4 (four) times daily -  before meals  and at bedtime. 1 kit 0  . carvedilol (COREG) 3.125 MG tablet Take 1 tablet (3.125 mg total) by mouth 2 (two) times daily. 180 tablet 3  . digoxin (LANOXIN) 0.125 MG tablet Take 1 tablet (0.125 mg total) by mouth daily. 30 tablet 3  . gabapentin (NEURONTIN) 300 MG capsule Take 300 mg by mouth every morning.     [name] glucose blood (ACCU-CHEK AVIVA) test strip Use as instructed 100 each 12  . insulin aspart (NOVOLOG) 100 UNIT/ML FlexPen Per sliding scale 15 mL 11  . Insulin Detemir (LEVEMIR FLEXPEN) 100 UNIT/ML Pen Inject 60 Units into the skin daily at 10 pm. 15 mL 11  . Lancets (ACCU-CHEK SOFT TOUCH) lancets Use as  instructed 100 each 12  . LEVEMIR 100 UNIT/ML injection     . midodrine (PROAMATINE) 2.5 MG tablet Take 1 tablet (2.5 mg total) by mouth 3 (three) times daily with meals. 90 tablet 4  . nitroGLYCERIN (NITRODUR - DOSED IN MG/24 HR) 0.4 mg/hr patch Place 1 patch (0.4 mg total) onto the skin daily. 30 patch 3  . pantoprazole (PROTONIX) 40 MG tablet Take 40 mg by mouth daily.    . pramipexole (MIRAPEX) 0.125 MG tablet Take 0.125 mg by mouth at bedtime.    . rosuvastatin (CRESTOR) 40 MG tablet take 1 tablet by mouth once daily 90 tablet 0  . senna (SENOKOT) 8.6 MG TABS tablet Take 1-2 tablets by mouth daily as needed for mild constipation.    [name] spironolactone (ALDACTONE) 25 MG tablet Take 0.5 tablets (12.5 mg total) by mouth daily. 30 tablet 3  . SSD 1 % cream Apply topically daily. 50 g 1  . torsemide (DEMADEX) 20 MG tablet Take 1 tablet (20 mg total) by mouth 3 (three) times a week. Every Mon, Wed and Fri 30 tablet 3  . warfarin (COUMADIN) 5 MG tablet Take 1-1.5 tablets by mouth daily as directed by coumadin clinic 40 tablet 2  . bisacodyl (DULCOLAX) 5 MG EC tablet Take 2 tablets (10 mg total) by mouth daily as needed for moderate constipation. (Patient not taking: Reported on 08/07/2016) 30 tablet 0   No current facility-administered medications for this encounter.     Vitals:   09/10/16 1151  BP: 118/62  Pulse: 81  SpO2: 100%  Weight: 150 lb (68 kg)   Wt Readings from Last 3 Encounters:  09/10/16 150 lb (68 kg)  08/23/16 145 lb 14.4 oz (66.2 kg)  08/07/16 147 lb (66.7 kg)     PHYSICAL EXAM:  General: NAD, Daughter present HEENT: Normal Neck: supple. JVP flat. Carotids 2+ bilaterally; no bruits. No thyromegaly or nodule noted.  Cor: PMI normal. Regular . No M/G/R. Sternal scar.  Lungs: CTAB, normal effort.  Abdomen: soft, NT, ND, no HSM. No bruits or masses. +BS  Extremities: no cyanosis, clubbing, rash, edema R foot s/p transmet amp Healed well. Mild petechial rash around  ankles Neuro: alert & orientedx3, cranial nerves grossly intact. Moves all 4 extremities w/o difficulty. Affect pleasant.   ASSESSMENT & PLAN: 1. Inerior STEMI CAD-->S/P CABG x1 VSD Repair with RV failure in 9/17 EF 40-45% on 9/17 echo.  On statin and aspirin.  --Refer for CR 2. Chronic Systolic Heart Failure- EF 40-45% on 9/17 echo. Echo reviewed personally today. EF 40-45% inferior AK. RV ok  -NYHA II -Volume status stable. -Continue spiro 12.5 mg daily. Will not increase with hyperkalemia -Stop digoxin -Continue coreg 3.125 mg BID.  -Will stop midodrine today and have him  seen in [facility] in 4 weeks to see if we can add losartan 12.5 bid -Repeat echo 3. PAD Ischemic R Great Toe s/p amputation 9/22 followed by R transmet amputation 12/17 4. Hyperlipidemia- Continue statin. Followed by PCP.  5. DM Type II-  Per PCP. Much improved 6. PAF - Maintaining NSR. - On coumadin. INR followed by [facility] Clinic.  7. RV failure - RV improved on echo 8. Orthostatic hypotension --Improved. Stopping midodrine  Glori Bickers, MD 12:21 PM

## 2016-09-10 NOTE — Patient Instructions (Signed)
STOP Midodrine  Stop Digoxin  Routine lab work today. Will notify you of abnormal results  Follow up with Doroteo Bradford (pharmacist) in 4 weeks  Follow up with Dr.Bensimhon in 3 months

## 2016-09-10 NOTE — Addendum Note (Signed)
Encounter addended by: Harvie Junior, CMA on: 09/10/2016 12:36 PM<BR>    Actions taken: Diagnosis association updated, Medication long-term status modified, Order list changed, Sign clinical note

## 2016-09-10 NOTE — Progress Notes (Signed)
  Echocardiogram 2D Echocardiogram has been performed.  Darlina Sicilian M 09/10/2016, 11:52 AM

## 2016-09-11 ENCOUNTER — Ambulatory Visit (INDEPENDENT_AMBULATORY_CARE_PROVIDER_SITE_OTHER): Payer: Medicare Other | Admitting: Pharmacist Clinician (PhC)/ Clinical Pharmacy Specialist

## 2016-09-11 ENCOUNTER — Ambulatory Visit (INDEPENDENT_AMBULATORY_CARE_PROVIDER_SITE_OTHER): Payer: Medicare Other | Admitting: Cardiovascular Disease

## 2016-09-11 ENCOUNTER — Encounter: Payer: Self-pay | Admitting: Cardiovascular Disease

## 2016-09-11 VITALS — BP 102/60 | HR 74 | Ht 65.0 in | Wt 150.0 lb

## 2016-09-11 DIAGNOSIS — I10 Essential (primary) hypertension: Secondary | ICD-10-CM | POA: Diagnosis not present

## 2016-09-11 DIAGNOSIS — I2510 Atherosclerotic heart disease of native coronary artery without angina pectoris: Secondary | ICD-10-CM | POA: Diagnosis not present

## 2016-09-11 DIAGNOSIS — Z5181 Encounter for therapeutic drug level monitoring: Secondary | ICD-10-CM | POA: Diagnosis not present

## 2016-09-11 DIAGNOSIS — I480 Paroxysmal atrial fibrillation: Secondary | ICD-10-CM

## 2016-09-11 DIAGNOSIS — I5022 Chronic systolic (congestive) heart failure: Secondary | ICD-10-CM | POA: Diagnosis not present

## 2016-09-11 DIAGNOSIS — I739 Peripheral vascular disease, unspecified: Secondary | ICD-10-CM

## 2016-09-11 LAB — POCT INR: INR: 1.6

## 2016-09-11 NOTE — Progress Notes (Signed)
Cardiology Office Note   Date:  09/11/2016   ID:  William [name], DOB [birth year], MRN [id number]  PCP:  Dorena Dew, FNP  Cardiologist:   [name], MD   Chief Complaint  Patient presents with  . Follow-up    2 months      History of Present Illness: William [name] is a 66 y.o. male who Is here today for a follow-up visit regarding peripheral arterial disease. He has known history of inferior myocardial infarction complicated by VSD in September, 2017. He is status post one-vessel CABG and VSD repair. He had postoperative atrial fibrillation and has been on anticoagulation. He had amputation of the right great toe while hospitalized for gangrene. The patient has known history of diabetes.  He is known to have peripheral arterial disease. Angiography in November showed no significant aortoiliac disease, significant right distal SFA stenosis and one-vessel runoff below the knee via the peroneal artery with reconstitution of the dorsalis pedis distally. I performed successful drug-coated balloon angioplasty of the right SFA.  He was hospitalized in December for osteomyelitis of the right foot. He underwent right transmetatarsal amputation without complications. The amputation site is Almost completely healed.  He developed fungal infection on his left big toe. The nail was removed and that area has not healed in over a month. He denies chest pain or shortness of breath.  Past Medical History:  Diagnosis Date  . AKI (acute kidney injury) ([place])    With STEMI in 2017  . Chronic systolic CHF (congestive heart failure) ([place])   . Diabetes mellitus without complication ([place])   . Hyperlipidemia   . Hypertension   . Paroxysmal atrial fibrillation ([name])   . STEMI (ST elevation myocardial infarction) ([place]) 2017    Past Surgical History:  Procedure Laterality Date  . AMPUTATION Right 03/23/2016   Procedure: 1st and 2nd Ray Amputation Right Foot;  Surgeon: Newt Minion, MD;   Location: [facility];  Service: Orthopedics;  Laterality: Right;  . AMPUTATION Right 06/21/2016   Procedure: RIGHT TRANSMETATARSAL AMPUTATION;  Surgeon: Newt Minion, MD;  Location: [facility];  Service: Orthopedics;  Laterality: Right;  . CARDIAC CATHETERIZATION N/A 03/13/2016   Procedure: Right/Left Heart Cath and Coronary Angiography;  Surgeon: Sherren Mocha, MD;  Location: [facility];  Service: Cardiovascular;  Laterality: N/A;  . CARDIAC CATHETERIZATION N/A 03/13/2016   Procedure: IABP Insertion;  Surgeon: Sherren Mocha, MD;  Location: [facility];  Service: Cardiovascular;  Laterality: N/A;  . CORONARY ARTERY BYPASS GRAFT N/A 03/13/2016   Procedure: CORONARY ARTERY BYPASS GRAFTING (CABG) x 1 (SVG to OM) with EVH from [facility] [place];  Surgeon: Ivin Poot, MD;  Location: [facility];  Service: Open Heart Surgery;  Laterality: N/A;  . LOWER EXTREMITY ANGIOGRAM  05/02/2016   Procedure: Lower Extremity Angiogram;  Surgeon: Wellington Hampshire, MD;  Location: [facility];  Service: Cardiovascular;;  Limited left femoral runoff right femoral runoff  . PERIPHERAL VASCULAR CATHETERIZATION N/A 05/02/2016   Procedure: Abdominal Aortogram;  Surgeon: Wellington Hampshire, MD;  Location: [facility];  Service: Cardiovascular;  Laterality: N/A;  . PERIPHERAL VASCULAR CATHETERIZATION Right 05/02/2016   Procedure: Peripheral Vascular Balloon Angioplasty;  Surgeon: Wellington Hampshire, MD;  Location: [facility];  Service: Cardiovascular;  Laterality: Right;  SFA  . TEE WITHOUT CARDIOVERSION N/A 03/13/2016   Procedure: TRANSESOPHAGEAL ECHOCARDIOGRAM (TEE);  Surgeon: Ivin Poot, MD;  Location: [facility];  Service: Open  Heart Surgery;  Laterality: N/A;  . VSD REPAIR N/A 03/13/2016   Procedure: VENTRICULAR SEPTAL DEFECT (VSD) REPAIR;  Surgeon: Peter Van Trigt, MD;  [facility];  Service: Open Heart Surgery;  Laterality: N/A;     Current Outpatient Prescriptions  Medication Sig  Dispense Refill  . aspirin EC 81 MG tablet Take 81 mg by mouth daily.    . bisacodyl (DULCOLAX) 5 MG EC tablet Take 2 tablets (10 mg total) by mouth daily as needed for moderate constipation. 30 tablet 0  . Blood Glucose Monitoring Suppl (ACCU-CHEK AVIVA PLUS) w/Device KIT 1 each by Does not apply route 4 (four) times daily -  before meals and at bedtime. 1 kit 0  . carvedilol (COREG) 3.125 MG tablet Take 1 tablet (3.125 mg total) by mouth 2 (two) times daily. 180 tablet 3  . gabapentin (NEURONTIN) 300 MG capsule Take 300 mg by mouth every morning.     . glucose blood (ACCU-CHEK AVIVA) test strip Use as instructed 100 each 12  . insulin aspart (NOVOLOG) 100 UNIT/ML FlexPen Per sliding scale 15 mL 11  . Insulin Detemir (LEVEMIR FLEXPEN) 100 UNIT/ML Pen Inject 60 Units into the skin daily at 10 pm. 15 mL 11  . Lancets (ACCU-CHEK SOFT TOUCH) lancets Use as instructed 100 each 12  . LEVEMIR 100 UNIT/ML injection     . nitroGLYCERIN (NITRODUR - DOSED IN MG/24 HR) 0.4 mg/hr patch Place 1 patch (0.4 mg total) onto the skin daily. 30 patch 3  . pantoprazole (PROTONIX) 40 MG tablet Take 40 mg by mouth daily.    . pramipexole (MIRAPEX) 0.125 MG tablet Take 0.125 mg by mouth at bedtime.    . rosuvastatin (CRESTOR) 40 MG tablet take 1 tablet by mouth once daily 90 tablet 0  . senna (SENOKOT) 8.6 MG TABS tablet Take 1-2 tablets by mouth daily as needed for mild constipation.    . spironolactone (ALDACTONE) 25 MG tablet Take 0.5 tablets (12.5 mg total) by mouth daily. 30 tablet 3  . SSD 1 % cream Apply topically daily. 50 g 1  . torsemide (DEMADEX) 20 MG tablet Take 1 tablet (20 mg total) by mouth 3 (three) times a week. Every Mon, Wed and Fri 30 tablet 3  . warfarin (COUMADIN) 5 MG tablet Take 1-1.5 tablets by mouth daily as directed by coumadin clinic 40 tablet 2   No current facility-administered medications for this visit.     Allergies:   Morphine and related    Social History:  The patient   reports that he quit smoking about 9 months ago. His smoking use included Cigarettes. He smoked 0.50 packs per day. He has never used smokeless tobacco. He reports that he does not drink alcohol or use drugs.   Family History:  Not able to obtain due to distress.  ROS:  Please see the history of present illness.   Otherwise, review of systems are positive for none.   All other systems are reviewed and negative.    PHYSICAL EXAM: VS:  BP 102/60   Pulse 74   Ht 5' 5" (1.651 m)   Wt 150 lb (68 kg)   BMI 24.96 kg/m  , BMI Body mass index is 24.96 kg/m. GEN: Well nourished, well developed, in no acute distress  HEENT: normal  Neck: no JVD, carotid bruits, or masses Cardiac: RRR; no  rubs, or gallops,no edema . No murmurs Respiratory:  clear to auscultation bilaterally, normal work of breathing GI: soft, nontender, nondistended, +   BS MS: no deformity or atrophy  Skin: warm and dry, no rash Neuro:  Strength and sensation are intact Psych: euthymic mood, full affect  Vascular: Distal pulses are not palpable. There is a large left big toe ulceration at the site of  nail removal with black discoloration.  EKG:  EKG is not ordered today.    Recent Labs: 06/22/2016: Magnesium 2.3 08/07/2016: ALT 28 08/17/2016: Hemoglobin 12.1; Platelets 262 09/10/2016: BUN 28; Creatinine, Ser 1.46; Potassium 4.5; Sodium 139    Lipid Panel    Component Value Date/Time   CHOL 96 03/13/2016 1057   TRIG 146 03/13/2016 1057   HDL 23 (L) 03/13/2016 1057   CHOLHDL 4.2 03/13/2016 1057   VLDL 29 03/13/2016 1057   LDLCALC 44 03/13/2016 1057      Wt Readings from Last 3 Encounters:  09/11/16 150 lb (68 kg)  09/10/16 150 lb (68 kg)  08/23/16 145 lb 14.4 oz (66.2 kg)      No flowsheet data found.    ASSESSMENT AND PLAN:  1.  Peripheral arterial disease :  He is status post recent drug-coated balloon angioplasty of the right SFA with good results. He is s/p right transmetatarsal amputation for  osteomyelitis with good healing . However, he now has an open wound on the left big toe with some black discoloration. He is known to have peripheral arterial disease on that side but did not have angiography done to the left leg before due to his chronic kidney disease. Given his peripheral arterial disease and diabetes, he is at high risk for progression. I discussed the options and recommended proceeding with abdominal aortogram with left lower extremity angiography and possible endovascular intervention. Hold warfarin 5 days before the procedure.  2. Chronic systolic heart failure: He appears to be euvolemic.   He is doing very well overall and appears to be euvolemic.  3.Postoperative atrial fibrillation: Currently in sinus rhythm.He is on anticoagulation with warfarin.  4. Coronary artery disease involving native coronary arteries without angina:  status post one-vessel CABG and VSD repair. Continue medical therapy.  5. Diabetes mellitus: Diabetes control has gradually improved over the last few months.   Disposition:   follow-up with me in 1 month  Signed,  [name], MD  09/11/2016 10:48 AM    [place]

## 2016-09-11 NOTE — Patient Instructions (Addendum)
Medication Instructions:  Your physician recommends that you continue on your current medications as directed. Please refer to the Current Medication list given to you today.  Labwork: No new orders.   Testing/Procedures: Your physician has requested that you have a peripheral vascular angiogram. This exam is performed at the hospital. During this exam IV contrast is used to look at arterial blood flow. Please review the information sheet given for details.    [facility] [address] [place] [address] Dept: [phone] Loc: [phone]  William [name]  09/11/2016  You are scheduled for a Peripheral Angiogram on Wednesday, March 21 with Dr. [name].  1. Please arrive at the [facility] (Main Entrance A) at [facility]: [place], [place] [address] at 8:30 AM (two hours before your procedure to ensure your preparation). Free valet parking service is available.   Special note: Every effort is made to have your procedure done on time. Please understand that emergencies sometimes delay scheduled procedures.  2. Diet: Do not eat or drink anything after midnight prior to your procedure except sips of water to take medications.  3. Labs: Your labs will be performed at the hospital after you arrive for your procedure.  4. Medication instructions in preparation for your procedure:  Stop taking Coumadin (Warfarin) on Friday, March 16. You will take your last dosage on Thursday, March 15.  Do not take Demadex the morning of procedure.    Take only half your normal dosage/ units of insulin the night before your procedure. Do not take any insulin on the day of the procedure.  On the morning of your procedure, take your Aspirin and any morning medicines NOT listed above.  You may use sips of water.  5. Plan for one night stay--bring personal belongings.  6. Bring a current  list of your medications and current insurance cards.  7. You MUST have a responsible person to drive you home.  8. Someone MUST be with you the first 24 hours after you arrive home or your discharge will be delayed.  9. Please wear clothes that are easy to get on and off and wear slip-on shoes.  Thank you for allowing [place] to care for you!   -- [facility]   Follow-Up: Your physician recommends that you schedule a follow-up appointment in: 3-4 WEEKS with Dr Fletcher Anon   Any Other Special Instructions Will Be Listed Below (If Applicable).     If you need a refill on your cardiac medications before your next appointment, please call your pharmacy.

## 2016-09-14 ENCOUNTER — Ambulatory Visit (INDEPENDENT_AMBULATORY_CARE_PROVIDER_SITE_OTHER): Payer: Medicare Other | Admitting: Family Medicine

## 2016-09-14 ENCOUNTER — Other Ambulatory Visit: Payer: Self-pay | Admitting: Family Medicine

## 2016-09-14 ENCOUNTER — Encounter: Payer: Self-pay | Admitting: Family Medicine

## 2016-09-14 VITALS — BP 90/63 | HR 91 | Temp 97.8°F | Resp 16 | Ht 65.0 in | Wt 152.0 lb

## 2016-09-14 DIAGNOSIS — Z23 Encounter for immunization: Secondary | ICD-10-CM | POA: Diagnosis not present

## 2016-09-14 DIAGNOSIS — Z794 Long term (current) use of insulin: Secondary | ICD-10-CM | POA: Diagnosis not present

## 2016-09-14 DIAGNOSIS — I2510 Atherosclerotic heart disease of native coronary artery without angina pectoris: Secondary | ICD-10-CM | POA: Diagnosis not present

## 2016-09-14 DIAGNOSIS — Z114 Encounter for screening for human immunodeficiency virus [HIV]: Secondary | ICD-10-CM | POA: Diagnosis not present

## 2016-09-14 DIAGNOSIS — Z1159 Encounter for screening for other viral diseases: Secondary | ICD-10-CM | POA: Diagnosis not present

## 2016-09-14 DIAGNOSIS — E1152 Type 2 diabetes mellitus with diabetic peripheral angiopathy with gangrene: Secondary | ICD-10-CM | POA: Diagnosis not present

## 2016-09-14 LAB — POCT GLYCOSYLATED HEMOGLOBIN (HGB A1C): Hemoglobin A1C: 9.3

## 2016-09-14 LAB — GLUCOSE, CAPILLARY: GLUCOSE-CAPILLARY: 178 mg/dL — AB (ref 65–99)

## 2016-09-14 MED ORDER — GLUCOSE BLOOD VI STRP: ORAL_STRIP | 5 refills | Status: DC

## 2016-09-14 NOTE — Patient Instructions (Signed)
Recommend Blue Star Ointment to circular rash on low back Will continue medications as previously prescribed. Also, continue carbohydrate modified diet  Sent test strips into pharmacy for 3 months with refills. Please tell pharmacy to contact me if there are any problems obtaining test strips.

## 2016-09-14 NOTE — Progress Notes (Signed)
Subjective:    Patient ID: William [name], male    DOB: Feb 19, 1951, 66 y.o.   MRN: [id number]  HPI [name], a 66 year old male with a history of STEMI in 2017, CHF, diabetes mellitus type 2, hyperlipidemia, and hypertension presents accompanied by daughter for a follow up of type 2 diabetes mellitus. Patient was previously evaluated by vein and vascular for PAD and is scheduled to have an angiogram on next week.     [name] has a history of uncontrolled type 2 diabetes mellitus with decreased circulation and diabetic neuropathy. He has been taking Levemir 60 units HS. His blood sugars have been fluctuating over the past several weeks. The range as been from 86-301. His daughter continues to maintain a journal and patient was recently referred to nutrition and diabetes education.  Symptoms: hyperglycemia, paresthesia of the feet and polyuria. Symptoms have gradually worsened. Patient denies hypoglycemia  and visual disturbances.  Evaluation to date has been included: hemoglobin A1C.  Patient also has a history of hypertension. He has decreased activity levels and is not adherent to a low fat, low sodium diet.  His daughter checks blood pressure at home and it is managed by Dr. Marlyne [name], cardiology.    Past Medical History:  Diagnosis Date  . AKI (acute kidney injury) ([place])    With STEMI in 2017  . Chronic systolic CHF (congestive heart failure) ([place])   . Diabetes mellitus without complication ([place])   . Hyperlipidemia   . Hypertension   . Paroxysmal atrial fibrillation ([name])   . STEMI (ST elevation myocardial infarction) ([place]) 2017    Social History   Social History  . Marital status: Married    Spouse name: Lavell William [name]  . Number of children: N/A  . Years of education: N/A   Occupational History  . Not on file.   Social History Main Topics  . Smoking status: Former Smoker    Packs/day: 0.50    Types: Cigarettes    Quit date: 11/20/2015  . Smokeless tobacco: Never Used  .  Alcohol use No  . Drug use: No  . Sexual activity: Not on file   Other Topics Concern  . Not on file   Social History Narrative  . No narrative on file   Immunization History  Administered Date(s) Administered  . Influenza,inj,Quad PF,36+ Mos 03/23/2016   Review of Systems  HENT: Negative.   Eyes: Negative.   Respiratory: Negative.   Cardiovascular: Negative.   Gastrointestinal: Negative.   Endocrine: Negative for polydipsia, polyphagia and polyuria.  Genitourinary: Negative.   Musculoskeletal: Positive for gait problem and myalgias.  Skin:       Healing amputation to right lower extremity. Rash to bilateral lower extremities and low back  Neurological: Positive for numbness (lower extremities).  Hematological: Negative.   Psychiatric/Behavioral: Negative.        Objective:   Physical Exam  Constitutional: He is oriented to person, place, and time.  Eyes: Conjunctivae are normal. Pupils are equal, round, and reactive to light.  Neck: Normal range of motion. Neck supple.  Pulmonary/Chest: Effort normal.  Abdominal: Soft. Bowel sounds are normal.  Musculoskeletal:       Right knee: He exhibits decreased range of motion.       Left knee: He exhibits decreased range of motion.  Forefoot amputation   Patient ambulates with a cane.   Neurological: He is alert and oriented to person, place, and time. He has normal reflexes.  Skin: Skin  is warm and dry.  Right foot dressing, unable to assess.   Psychiatric: He has a normal mood and affect. His behavior is normal. Judgment and thought content normal.       BP 90/63 (BP Location: Left Arm, Patient Position: Sitting, Cuff Size: Normal)   Pulse 91   Temp 97.8 F (36.6 C) (Oral)   Resp 16   Ht 5\' 5"  (1.651 m)   Wt 152 lb (68.9 kg)   BMI 25.29 kg/m  Assessment & Plan:  1. Type 2 diabetes mellitus with diabetic peripheral angiopathy and gangrene, with long-term current use of insulin ([name]) Hemoglobin a1c is 9.3.Will  continue to monitor. Patient is also having difficulty with meal planning. Recommend that patient continue carbohydrate modified diet - HgB A1c - Glucose, capillary - glucose blood (ACCU-CHEK AVIVA) test strip; Check blood sugars three times per day prior to meals and bedtime  Dispense: 360 each; Refill: 5 - Ambulatory referral to Ophthalmology  2. Need for hepatitis C screening test  - Hepatitis C antibody, reflex  3. Screening for HIV (human immunodeficiency virus) - HIV antibody (with reflex)  4. Need for Tdap vaccination - Tdap vaccine greater than or equal to 7yo IM  5. Immunization due  - Pneumococcal conjugate vaccine 13-valent  RTC: 1 week for DMII medication management.  Greater than 50% of visit spent counseling on DMII Shawan Tosh M, FNP

## 2016-09-15 LAB — HEPATITIS C ANTIBODY: HCV Ab: REACTIVE — AB

## 2016-09-16 LAB — HIV ANTIBODY (ROUTINE TESTING W REFLEX): HIV: NONREACTIVE

## 2016-09-17 ENCOUNTER — Encounter (INDEPENDENT_AMBULATORY_CARE_PROVIDER_SITE_OTHER): Payer: Self-pay | Admitting: Orthopedic Surgery

## 2016-09-17 ENCOUNTER — Ambulatory Visit (INDEPENDENT_AMBULATORY_CARE_PROVIDER_SITE_OTHER): Payer: Medicare Other | Admitting: Orthopedic Surgery

## 2016-09-17 ENCOUNTER — Other Ambulatory Visit (HOSPITAL_COMMUNITY): Payer: Self-pay | Admitting: Student

## 2016-09-17 DIAGNOSIS — E11621 Type 2 diabetes mellitus with foot ulcer: Secondary | ICD-10-CM | POA: Diagnosis not present

## 2016-09-17 DIAGNOSIS — L97529 Non-pressure chronic ulcer of other part of left foot with unspecified severity: Secondary | ICD-10-CM

## 2016-09-17 DIAGNOSIS — I2510 Atherosclerotic heart disease of native coronary artery without angina pectoris: Secondary | ICD-10-CM

## 2016-09-17 DIAGNOSIS — N1832 Chronic kidney disease, stage 3b: Secondary | ICD-10-CM | POA: Insufficient documentation

## 2016-09-17 DIAGNOSIS — I739 Peripheral vascular disease, unspecified: Secondary | ICD-10-CM | POA: Insufficient documentation

## 2016-09-17 LAB — HEPATITIS C RNA QUANTITATIVE
HCV Quantitative Log: <1.18 Log IU/mL
HCV Quantitative: <15 IU/mL

## 2016-09-17 NOTE — Progress Notes (Signed)
Office Visit Note   Patient: William [name]           Date of Birth: 12/05/1950           MRN: [id number] Visit Date: 09/17/2016              Requested by: Dorena Dew, FNP [address] [place], [place] [address] PCP: Dorena Dew, FNP  Chief Complaint  Patient presents with  . Left Foot - Follow-up    HPI: Left great toe follow up. The patient has completed his course of ABX and applied silvadene dressing to toe daily. He is weight bearing with a cane and a post op shoe. The toe has a small soft eschar and there is slight maceration and minimal swelling. There is no redness and no odor. Autumn Stark Jock, RMA  States has new PCP. Has had better control of BSGs. Daughter noticed improvement in wound and no drainage. She states infection is resolved.   States has revascularization procedure scheduled for this Wednesday.   Assessment & Plan: Visit Diagnoses:  1. Diabetic ulcer of left great toe ([place])   2. Peripheral vascular disorder ([place])     Plan: Continue with silvadene dressings daily. Follow up in 3 weeks following revascularization left lower extremity.   Follow-Up Instructions: Return in about 3 weeks (around 10/08/2016).   Physical Exam  Constitutional: Appears well-developed.  Head: Normocephalic.  Eyes: EOM are normal.  Neck: Normal range of motion.  Cardiovascular: Normal rate.   Pulmonary/Chest: Effort normal.  Neurological: Is alert.  Skin: Skin is warm.  Psychiatric: Has a normal mood and affect. Left great toe ulceration distal tip. This is 2 cm x 1 cm. Eschar and exudative tissue in 80% of wound bed. There is some granulation tissue surrounding. No drainage. No erythema. No sausage digit swelling. Does have 1+ pititng edema left lower extremity.  Ortho Exam   Imaging: No results found.  Labs: Lab Results  Component Value Date   HGBA1C 9.3 09/14/2016   HGBA1C 9.3 08/07/2016   HGBA1C 9.2 07/06/2016   REPTSTATUS 06/26/2016 FINAL  06/21/2016   GRAMSTAIN  03/14/2016    RARE WBC PRESENT, PREDOMINANTLY PMN NO ORGANISMS SEEN    CULT NO GROWTH 5 DAYS 06/21/2016   LABORGA PSEUDOMONAS AERUGINOSA 03/14/2016    Orders:  No orders of the defined types were placed in this encounter.  No orders of the defined types were placed in this encounter.    Procedures: No procedures performed  Clinical Data: No additional findings.  Subjective: Review of Systems  Constitutional: Negative for chills and fever.  Cardiovascular: Positive for leg swelling.  Skin: Positive for wound. Negative for color change.    Objective: Vital Signs: Ht 5\' 5"  (1.651 m)   Wt 152 lb (68.9 kg)   BMI 25.29 kg/m   Specialty Comments:  No specialty comments available.  PMFS History: Patient Active Problem List   Diagnosis Date Noted  . Diabetic ulcer of left great toe ([place]) 09/17/2016  . Peripheral vascular disorder ([place]) 09/17/2016  . S/P transmetatarsal amputation of foot, right ([place]) 07/06/2016  . Abnormality of gait 07/06/2016  . CKD (chronic kidney disease) stage 3, GFR 30-59 ml/min   . Osteomyelitis ([place]) 06/21/2016  . Sepsis, unspecified organism ([place]) 06/21/2016  . AKI (acute kidney injury) ([place]) 06/21/2016  . Anemia 06/21/2016  . Hyponatremia 06/21/2016  . Atherosclerosis of native artery of right lower extremity with gangrene ([place]) 06/05/2016  . Chronic systolic HF (heart  failure) ([place]) 05/28/2016  . Hypoalbuminemia 03/21/2016  . Coronary artery disease due to lipid rich plaque 03/16/2016  . VSD (ventricular septal defect) 03/16/2016  . Hx of CABG   . Paroxysmal atrial fibrillation ([place]) 03/15/2016  . Erectile dysfunction associated with type 2 diabetes mellitus ([place]) 04/07/2012  . Mood disorder ([place]) 03/12/2011  . Essential hypertension 03/17/2010  . ONYCHOMYCOSIS, TOENAILS 01/03/2010  . NICOTINE ADDICTION 12/28/2008  . Hyperlipidemia 11/01/2008  . GERD 11/01/2008  . SHOULDER PAIN, LEFT, CHRONIC 11/01/2008  .  NECK PAIN, CHRONIC 11/01/2008  . Type 2 diabetes mellitus with diabetic peripheral angiopathy and gangrene, with long-term current use of insulin ([place]) 09/23/2008   Past Medical History:  Diagnosis Date  . AKI (acute kidney injury) ([place])    With STEMI in 2017  . Chronic systolic CHF (congestive heart failure) ([place])   . Diabetes mellitus without complication ([place])   . Hyperlipidemia   . Hypertension   . Paroxysmal atrial fibrillation ([name])   . STEMI (ST elevation myocardial infarction) ([place]) 2017    Family History  Problem Relation Age of Onset  . Diabetes Maternal Grandmother   . Diabetes Mother   . Aneurysm Mother   . Peripheral Artery Disease Mother   . Coronary artery disease Mother   . Peptic Ulcer Father     Past Surgical History:  Procedure Laterality Date  . AMPUTATION Right 03/23/2016   Procedure: 1st and 2nd Ray Amputation Right Foot;  Surgeon: Newt Minion, MD;  Location: [facility];  Service: Orthopedics;  Laterality: Right;  . AMPUTATION Right 06/21/2016   Procedure: RIGHT TRANSMETATARSAL AMPUTATION;  Surgeon: Newt Minion, MD;  Location: [facility];  Service: Orthopedics;  Laterality: Right;  . CARDIAC CATHETERIZATION N/A 03/13/2016   Procedure: Right/Left Heart Cath and Coronary Angiography;  Surgeon: Sherren Mocha, MD;  Location: [facility];  Service: Cardiovascular;  Laterality: N/A;  . CARDIAC CATHETERIZATION N/A 03/13/2016   Procedure: IABP Insertion;  Surgeon: Sherren Mocha, MD;  Location: [facility];  Service: Cardiovascular;  Laterality: N/A;  . CORONARY ARTERY BYPASS GRAFT N/A 03/13/2016   Procedure: CORONARY ARTERY BYPASS GRAFTING (CABG) x 1 (SVG to OM) with EVH from [place];  Surgeon: Ivin Poot, MD;  Location: [facility];  Service: Open Heart Surgery;  Laterality: N/A;  . LOWER EXTREMITY ANGIOGRAM  05/02/2016   Procedure: Lower Extremity Angiogram;  Surgeon: Wellington Hampshire, MD;  Location: [facility];  Service:  Cardiovascular;;  Limited left femoral runoff right femoral runoff  . PERIPHERAL VASCULAR CATHETERIZATION N/A 05/02/2016   Procedure: Abdominal Aortogram;  Surgeon: Wellington Hampshire, MD;  Location: [facility];  Service: Cardiovascular;  Laterality: N/A;  . PERIPHERAL VASCULAR CATHETERIZATION Right 05/02/2016   Procedure: Peripheral Vascular Balloon Angioplasty;  Surgeon: Wellington Hampshire, MD;  Location: [facility];  Service: Cardiovascular;  Laterality: Right;  SFA  . TEE WITHOUT CARDIOVERSION N/A 03/13/2016   Procedure: TRANSESOPHAGEAL ECHOCARDIOGRAM (TEE);  Surgeon: Ivin Poot, MD;  Location: [facility];  Service: Open Heart Surgery;  Laterality: N/A;  . VSD REPAIR N/A 03/13/2016   Procedure: VENTRICULAR SEPTAL DEFECT (VSD) REPAIR;  Surgeon: Ivin Poot, MD;  Location: [facility];  Service: Open Heart Surgery;  Laterality: N/A;   Social History   Occupational History  . Not on file.   Social History Main Topics  . Smoking status: Former Smoker    Packs/day: 0.50    Types: Cigarettes  Quit date: 11/20/2015  . Smokeless tobacco: Never Used  . Alcohol use No  . Drug use: No  . Sexual activity: Not on file

## 2016-09-19 ENCOUNTER — Ambulatory Visit (HOSPITAL_COMMUNITY)
Admission: RE | Admit: 2016-09-19 | Discharge: 2016-09-19 | Disposition: A | Discharge status: Home / Self Care | Payer: Medicare Other | Source: Ambulatory Visit | Attending: Cardiovascular Disease | Admitting: Cardiovascular Disease

## 2016-09-19 ENCOUNTER — Encounter (HOSPITAL_COMMUNITY)
Admission: RE | Disposition: A | Discharge status: Home / Self Care | Payer: Self-pay | Source: Ambulatory Visit | Attending: Cardiovascular Disease

## 2016-09-19 DIAGNOSIS — E1151 Type 2 diabetes mellitus with diabetic peripheral angiopathy without gangrene: Secondary | ICD-10-CM | POA: Insufficient documentation

## 2016-09-19 DIAGNOSIS — E11621 Type 2 diabetes mellitus with foot ulcer: Secondary | ICD-10-CM | POA: Diagnosis not present

## 2016-09-19 DIAGNOSIS — I480 Paroxysmal atrial fibrillation: Secondary | ICD-10-CM | POA: Diagnosis not present

## 2016-09-19 DIAGNOSIS — Z87891 Personal history of nicotine dependence: Secondary | ICD-10-CM | POA: Insufficient documentation

## 2016-09-19 DIAGNOSIS — I2510 Atherosclerotic heart disease of native coronary artery without angina pectoris: Secondary | ICD-10-CM | POA: Insufficient documentation

## 2016-09-19 DIAGNOSIS — I701 Atherosclerosis of renal artery: Secondary | ICD-10-CM | POA: Insufficient documentation

## 2016-09-19 DIAGNOSIS — L97529 Non-pressure chronic ulcer of other part of left foot with unspecified severity: Secondary | ICD-10-CM | POA: Insufficient documentation

## 2016-09-19 DIAGNOSIS — I252 Old myocardial infarction: Secondary | ICD-10-CM | POA: Diagnosis not present

## 2016-09-19 DIAGNOSIS — Z89411 Acquired absence of right great toe: Secondary | ICD-10-CM | POA: Diagnosis not present

## 2016-09-19 DIAGNOSIS — I739 Peripheral vascular disease, unspecified: Secondary | ICD-10-CM

## 2016-09-19 DIAGNOSIS — Z8774 Personal history of (corrected) congenital malformations of heart and circulatory system: Secondary | ICD-10-CM | POA: Diagnosis not present

## 2016-09-19 DIAGNOSIS — I700 Atherosclerosis of aorta: Secondary | ICD-10-CM | POA: Insufficient documentation

## 2016-09-19 DIAGNOSIS — I70245 Atherosclerosis of native arteries of left leg with ulceration of other part of foot: Secondary | ICD-10-CM | POA: Insufficient documentation

## 2016-09-19 DIAGNOSIS — Z7901 Long term (current) use of anticoagulants: Secondary | ICD-10-CM | POA: Insufficient documentation

## 2016-09-19 DIAGNOSIS — Z7982 Long term (current) use of aspirin: Secondary | ICD-10-CM | POA: Diagnosis not present

## 2016-09-19 DIAGNOSIS — I5022 Chronic systolic (congestive) heart failure: Secondary | ICD-10-CM | POA: Diagnosis not present

## 2016-09-19 DIAGNOSIS — E785 Hyperlipidemia, unspecified: Secondary | ICD-10-CM | POA: Insufficient documentation

## 2016-09-19 DIAGNOSIS — I110 Hypertensive heart disease with heart failure: Secondary | ICD-10-CM | POA: Insufficient documentation

## 2016-09-19 DIAGNOSIS — Z794 Long term (current) use of insulin: Secondary | ICD-10-CM | POA: Diagnosis not present

## 2016-09-19 DIAGNOSIS — Z951 Presence of aortocoronary bypass graft: Secondary | ICD-10-CM | POA: Diagnosis not present

## 2016-09-19 DIAGNOSIS — I70248 Atherosclerosis of native arteries of left leg with ulceration of other part of lower left leg: Secondary | ICD-10-CM | POA: Diagnosis not present

## 2016-09-19 HISTORY — PX: ABDOMINAL AORTOGRAM W/LOWER EXTREMITY: CATH118223

## 2016-09-19 LAB — BASIC METABOLIC PANEL
Anion gap: 8 (ref 5–15)
BUN: 26 mg/dL — AB (ref 6–20)
CHLORIDE: 103 mmol/L (ref 101–111)
CO2: 26 mmol/L (ref 22–32)
CREATININE: 1.44 mg/dL — AB (ref 0.61–1.24)
Calcium: 9.6 mg/dL (ref 8.9–10.3)
GFR calc non Af Amer: 50 mL/min — ABNORMAL LOW (ref >60)
GFR, EST AFRICAN AMERICAN: 57 mL/min — AB (ref >60)
Glucose, Bld: 268 mg/dL — ABNORMAL HIGH (ref 65–99)
POTASSIUM: 4.2 mmol/L (ref 3.5–5.1)
SODIUM: 137 mmol/L (ref 135–145)

## 2016-09-19 LAB — CBC
HCT: 32.5 % — ABNORMAL LOW (ref 39.0–52.0)
Hemoglobin: 10.5 g/dL — ABNORMAL LOW (ref 13.0–17.0)
MCH: 26.7 pg (ref 26.0–34.0)
MCHC: 32.3 g/dL (ref 30.0–36.0)
MCV: 82.7 fL (ref 78.0–100.0)
Platelets: 182 10*3/uL (ref 150–400)
RBC: 3.93 MIL/uL — ABNORMAL LOW (ref 4.22–5.81)
RDW: 15.4 % (ref 11.5–15.5)
WBC: 6.8 10*3/uL (ref 4.0–10.5)

## 2016-09-19 LAB — PROTIME-INR
INR: 1.06
PROTHROMBIN TIME: 13.9 s (ref 11.4–15.2)

## 2016-09-19 LAB — GLUCOSE, CAPILLARY
GLUCOSE-CAPILLARY: 216 mg/dL — AB (ref 65–99)
Glucose-Capillary: 268 mg/dL — ABNORMAL HIGH (ref 65–99)

## 2016-09-19 SURGERY — ABDOMINAL AORTOGRAM W/LOWER EXTREMITY
Anesthesia: LOCAL

## 2016-09-19 MED ORDER — SODIUM CHLORIDE 0.9 % IV SOLN
INTRAVENOUS | Status: DC
  Administered 2016-09-19: 09:00:00 via INTRAVENOUS

## 2016-09-19 MED ORDER — ASPIRIN 81 MG PO CHEW
CHEWABLE_TABLET | ORAL | Status: AC
  Administered 2016-09-19: 81 mg via ORAL
  Filled 2016-09-19: qty 1

## 2016-09-19 MED ORDER — HEPARIN (PORCINE) IN NACL 2-0.9 UNIT/ML-% IJ SOLN
INTRAMUSCULAR | Status: AC
  Filled 2016-09-19: qty 1000

## 2016-09-19 MED ORDER — SODIUM CHLORIDE 0.9 % IV SOLN: 250.0000 mL | INTRAVENOUS | Status: DC | PRN

## 2016-09-19 MED ORDER — HEPARIN (PORCINE) IN NACL 2-0.9 UNIT/ML-% IJ SOLN
INTRAMUSCULAR | Status: DC | PRN
  Administered 2016-09-19: 1000 mL

## 2016-09-19 MED ORDER — SODIUM CHLORIDE 0.9 % IV SOLN: INTRAVENOUS | Status: AC

## 2016-09-19 MED ORDER — LIDOCAINE HCL (PF) 1 % IJ SOLN
INTRAMUSCULAR | Status: DC | PRN
  Administered 2016-09-19: 15 mL

## 2016-09-19 MED ORDER — SODIUM CHLORIDE 0.9% FLUSH: 3.0000 mL | INTRAVENOUS | Status: DC | PRN

## 2016-09-19 MED ORDER — LIDOCAINE HCL (PF) 1 % IJ SOLN
INTRAMUSCULAR | Status: AC
  Filled 2016-09-19: qty 30

## 2016-09-19 MED ORDER — SODIUM CHLORIDE 0.9% FLUSH: 3.0000 mL | Freq: Two times a day (BID) | INTRAVENOUS | Status: DC

## 2016-09-19 MED ORDER — IODIXANOL 320 MG/ML IV SOLN
INTRAVENOUS | Status: DC | PRN
  Administered 2016-09-19: 55 mL via INTRA_ARTERIAL

## 2016-09-19 MED ORDER — ASPIRIN 81 MG PO CHEW
81.0000 mg | CHEWABLE_TABLET | ORAL | Status: AC
  Administered 2016-09-19: 81 mg via ORAL

## 2016-09-19 SURGICAL SUPPLY — 11 items
CATH ANGIO 5F PIGTAIL 65CM (CATHETERS) ×1 IMPLANT
CATH CROSS OVER TEMPO 5F (CATHETERS) ×1 IMPLANT
CATH STRAIGHT 5FR 65CM (CATHETERS) ×1 IMPLANT
KIT PV (KITS) ×2 IMPLANT
SHEATH PINNACLE 5F 10CM (SHEATH) ×1 IMPLANT
STOPCOCK MORSE 400PSI 3WAY (MISCELLANEOUS) ×1 IMPLANT
SYRINGE MEDRAD AVANTA MACH 7 (SYRINGE) ×1 IMPLANT
TRANSDUCER W/STOPCOCK (MISCELLANEOUS) ×2 IMPLANT
TRAY PV CATH (CUSTOM PROCEDURE TRAY) ×2 IMPLANT
TUBING CIL FLEX 10 FLL-RA (TUBING) ×1 IMPLANT
WIRE HITORQ VERSACORE ST 145CM (WIRE) ×1 IMPLANT

## 2016-09-19 NOTE — Progress Notes (Signed)
Site area: RT GROIN Site Prior to Removal:  Level 0 Pressure Applied For:20 MINUTES Manual:   YES Patient Status During Pull:  AWAKE Post Pull Site:  Level 0 Post Pull Instructions Given:YES   Post Pull Pulses Present: BIL DP DOPPLE Dressing Applied:  YES Bedrest begins @ 12:05 Comments:

## 2016-09-19 NOTE — Discharge Instructions (Signed)
Femoral Site Care °Refer to this sheet in the next few weeks. These instructions provide you with information about caring for yourself after your procedure. Your health care provider may also give you more specific instructions. Your treatment has been planned according to current medical practices, but problems sometimes occur. Call your health care provider if you have any problems or questions after your procedure. °What can I expect after the procedure? °After your procedure, it is typical to have the following: °· Bruising at the site that usually fades within 1-2 weeks. °· Blood collecting in the tissue (hematoma) that may be painful to the touch. It should usually decrease in size and tenderness within 1-2 weeks. °Follow these instructions at home: °· Take medicines only as directed by your health care provider. °· You may shower 24-48 hours after the procedure or as directed by your health care provider. Remove the bandage (dressing) and gently wash the site with plain soap and water. Pat the area dry with a clean towel. Do not rub the site, because this may cause bleeding. °· Do not take baths, swim, or use a hot tub until your health care provider approves. °· Check your insertion site every day for redness, swelling, or drainage. °· Do not apply powder or lotion to the site. °· Limit use of stairs to twice a day for the first 2-3 days or as directed by your health care provider. °· Do not squat for the first 2-3 days or as directed by your health care provider. °· Do not lift over 10 lb (4.5 kg) for 5 days after your procedure or as directed by your health care provider. °· Ask your health care provider when it is okay to: °¨ Return to work or school. °¨ Resume usual physical activities or sports. °¨ Resume sexual activity. °· Do not drive home if you are discharged the same day as the procedure. Have someone else drive you. °· You may drive 24 hours after the procedure unless otherwise instructed by  your health care provider. °· Do not operate machinery or power tools for 24 hours after the procedure or as directed by your health care provider. °· If your procedure was done as an outpatient procedure, which means that you went home the same day as your procedure, a responsible adult should be with you for the first 24 hours after you arrive home. °· Keep all follow-up visits as directed by your health care provider. This is important. °Contact a health care provider if: °· You have a fever. °· You have chills. °· You have increased bleeding from the site. Hold pressure on the site. °Get help right away if: °· You have unusual pain at the site. °· You have redness, warmth, or swelling at the site. °· You have drainage (other than a small amount of blood on the dressing) from the site. °· The site is bleeding, and the bleeding does not stop after 30 minutes of holding steady pressure on the site. °· Your leg or foot becomes pale, cool, tingly, or numb. °This information is not intended to replace advice given to you by your health care provider. Make sure you discuss any questions you have with your health care provider. °Document Released: 02/19/2014 Document Revised: 11/24/2015 Document Reviewed: 01/05/2014 °Elsevier Interactive Patient Education © 2017 [employer] ° °

## 2016-09-19 NOTE — Progress Notes (Signed)
Assumed care of pt from Debbie Hedge, RN.  Assessment documented. 

## 2016-09-19 NOTE — H&P (View-Only) (Signed)
Cardiology Office Note   Date:  09/11/2016   ID:  [name], DOB [birth year], MRN [id number]  PCP:  Dorena Dew, FNP  Cardiologist:   [name], MD   Chief Complaint  Patient presents with  . Follow-up    2 months      History of Present Illness: William [name] is a 66 y.o. male who Is here today for a follow-up visit regarding peripheral arterial disease. He has known history of inferior myocardial infarction complicated by VSD in September, 2017. He is status post one-vessel CABG and VSD repair. He had postoperative atrial fibrillation and has been on anticoagulation. He had amputation of the right great toe while hospitalized for gangrene. The patient has known history of diabetes.  He is known to have peripheral arterial disease. Angiography in November showed no significant aortoiliac disease, significant right distal SFA stenosis and one-vessel runoff below the knee via the peroneal artery with reconstitution of the dorsalis pedis distally. I performed successful drug-coated balloon angioplasty of the right SFA.  He was hospitalized in December for osteomyelitis of the right foot. He underwent right transmetatarsal amputation without complications. The amputation site is Almost completely healed.  He developed fungal infection on his left big toe. The nail was removed and that area has not healed in over a month. He denies chest pain or shortness of breath.  Past Medical History:  Diagnosis Date  . AKI (acute kidney injury) ([place])    With STEMI in 2017  . Chronic systolic CHF (congestive heart failure) ([place])   . Diabetes mellitus without complication ([place])   . Hyperlipidemia   . Hypertension   . Paroxysmal atrial fibrillation ([name])   . STEMI (ST elevation myocardial infarction) ([place]) 2017    Past Surgical History:  Procedure Laterality Date  . AMPUTATION Right 03/23/2016   Procedure: 1st and 2nd Ray Amputation Right Foot;  Surgeon: Newt Minion, MD;   Location: [facility];  Service: Orthopedics;  Laterality: Right;  . AMPUTATION Right 06/21/2016   Procedure: RIGHT TRANSMETATARSAL AMPUTATION;  Surgeon: Newt Minion, MD;  Location: [facility];  Service: Orthopedics;  Laterality: Right;  . CARDIAC CATHETERIZATION N/A 03/13/2016   Procedure: Right/Left Heart Cath and Coronary Angiography;  Surgeon: Sherren Mocha, MD;  Location: [facility];  Service: Cardiovascular;  Laterality: N/A;  . CARDIAC CATHETERIZATION N/A 03/13/2016   Procedure: IABP Insertion;  Surgeon: Sherren Mocha, MD;  Location: [facility];  Service: Cardiovascular;  Laterality: N/A;  . CORONARY ARTERY BYPASS GRAFT N/A 03/13/2016   Procedure: CORONARY ARTERY BYPASS GRAFTING (CABG) x 1 (SVG to OM) with EVH from [facility] [place];  Surgeon: Ivin Poot, MD;  Location: [facility];  Service: Open Heart Surgery;  Laterality: N/A;  . LOWER EXTREMITY ANGIOGRAM  05/02/2016   Procedure: Lower Extremity Angiogram;  Surgeon: Wellington Hampshire, MD;  Location: [facility];  Service: Cardiovascular;;  Limited left femoral runoff right femoral runoff  . PERIPHERAL VASCULAR CATHETERIZATION N/A 05/02/2016   Procedure: Abdominal Aortogram;  Surgeon: Wellington Hampshire, MD;  Location: [facility];  Service: Cardiovascular;  Laterality: N/A;  . PERIPHERAL VASCULAR CATHETERIZATION Right 05/02/2016   Procedure: Peripheral Vascular Balloon Angioplasty;  Surgeon: Wellington Hampshire, MD;  Location: [facility];  Service: Cardiovascular;  Laterality: Right;  SFA  . TEE WITHOUT CARDIOVERSION N/A 03/13/2016   Procedure: TRANSESOPHAGEAL ECHOCARDIOGRAM (TEE);  Surgeon: Ivin Poot, MD;  Location: [facility];  Service: Open  Heart Surgery;  Laterality: N/A;  . VSD REPAIR N/A 03/13/2016   Procedure: VENTRICULAR SEPTAL DEFECT (VSD) REPAIR;  Surgeon: Ivin Poot, MD;  Location: [facility];  Service: Open Heart Surgery;  Laterality: N/A;     Current Outpatient Prescriptions  Medication Sig  Dispense Refill  . aspirin EC 81 MG tablet Take 81 mg by mouth daily.    . bisacodyl (DULCOLAX) 5 MG EC tablet Take 2 tablets (10 mg total) by mouth daily as needed for moderate constipation. 30 tablet 0  . Blood Glucose Monitoring Suppl (ACCU-CHEK AVIVA PLUS) w/Device KIT 1 each by Does not apply route 4 (four) times daily -  before meals and at bedtime. 1 kit 0  . carvedilol (COREG) 3.125 MG tablet Take 1 tablet (3.125 mg total) by mouth 2 (two) times daily. 180 tablet 3  . gabapentin (NEURONTIN) 300 MG capsule Take 300 mg by mouth every morning.     [name] glucose blood (ACCU-CHEK AVIVA) test strip Use as instructed 100 each 12  . insulin aspart (NOVOLOG) 100 UNIT/ML FlexPen Per sliding scale 15 mL 11  . Insulin Detemir (LEVEMIR FLEXPEN) 100 UNIT/ML Pen Inject 60 Units into the skin daily at 10 pm. 15 mL 11  . Lancets (ACCU-CHEK SOFT TOUCH) lancets Use as instructed 100 each 12  . LEVEMIR 100 UNIT/ML injection     . nitroGLYCERIN (NITRODUR - DOSED IN MG/24 HR) 0.4 mg/hr patch Place 1 patch (0.4 mg total) onto the skin daily. 30 patch 3  . pantoprazole (PROTONIX) 40 MG tablet Take 40 mg by mouth daily.    . pramipexole (MIRAPEX) 0.125 MG tablet Take 0.125 mg by mouth at bedtime.    . rosuvastatin (CRESTOR) 40 MG tablet take 1 tablet by mouth once daily 90 tablet 0  . senna (SENOKOT) 8.6 MG TABS tablet Take 1-2 tablets by mouth daily as needed for mild constipation.    [name] spironolactone (ALDACTONE) 25 MG tablet Take 0.5 tablets (12.5 mg total) by mouth daily. 30 tablet 3  . SSD 1 % cream Apply topically daily. 50 g 1  . torsemide (DEMADEX) 20 MG tablet Take 1 tablet (20 mg total) by mouth 3 (three) times a week. Every Mon, Wed and Fri 30 tablet 3  . warfarin (COUMADIN) 5 MG tablet Take 1-1.5 tablets by mouth daily as directed by coumadin clinic 40 tablet 2   No current facility-administered medications for this visit.     Allergies:   Morphine and related    Social History:  The patient   reports that he quit smoking about 9 months ago. His smoking use included Cigarettes. He smoked 0.50 packs per day. He has never used smokeless tobacco. He reports that he does not drink alcohol or use drugs.   Family History:  Not able to obtain due to distress.  ROS:  Please see the history of present illness.   Otherwise, review of systems are positive for none.   All other systems are reviewed and negative.    PHYSICAL EXAM: VS:  BP 102/60   Pulse 74   Ht 5' 5" (1.651 m)   Wt 150 lb (68 kg)   BMI 24.96 kg/m  , BMI Body mass index is 24.96 kg/m. GEN: Well nourished, well developed, in no acute distress  HEENT: normal  Neck: no JVD, carotid bruits, or masses Cardiac: RRR; no  rubs, or gallops,no edema . No murmurs Respiratory:  clear to auscultation bilaterally, normal work of breathing GI: soft, nontender, nondistended, +  BS MS: no deformity or atrophy  Skin: warm and dry, no rash Neuro:  Strength and sensation are intact Psych: euthymic mood, full affect  Vascular: Distal pulses are not palpable. There is a large left big toe ulceration at the site of  nail removal with black discoloration.  EKG:  EKG is not ordered today.    Recent Labs: 06/22/2016: Magnesium 2.3 08/07/2016: ALT 28 08/17/2016: Hemoglobin 12.1; Platelets 262 09/10/2016: BUN 28; Creatinine, Ser 1.46; Potassium 4.5; Sodium 139    Lipid Panel    Component Value Date/Time   CHOL 96 03/13/2016 1057   TRIG 146 03/13/2016 1057   HDL 23 (L) 03/13/2016 1057   CHOLHDL 4.2 03/13/2016 1057   VLDL 29 03/13/2016 1057   LDLCALC 44 03/13/2016 1057      Wt Readings from Last 3 Encounters:  09/11/16 150 lb (68 kg)  09/10/16 150 lb (68 kg)  08/23/16 145 lb 14.4 oz (66.2 kg)      No flowsheet data found.    ASSESSMENT AND PLAN:  1.  Peripheral arterial disease :  He is status post recent drug-coated balloon angioplasty of the right SFA with good results. He is s/p right transmetatarsal amputation for  osteomyelitis with good healing . However, he now has an open wound on the left big toe with some black discoloration. He is known to have peripheral arterial disease on that side but did not have angiography done to the left leg before due to his chronic kidney disease. Given his peripheral arterial disease and diabetes, he is at high risk for progression. I discussed the options and recommended proceeding with abdominal aortogram with left lower extremity angiography and possible endovascular intervention. Hold warfarin 5 days before the procedure.  2. Chronic systolic heart failure: He appears to be euvolemic.   He is doing very well overall and appears to be euvolemic.  3.Postoperative atrial fibrillation: Currently in sinus rhythm.He is on anticoagulation with warfarin.  4. Coronary artery disease involving native coronary arteries without angina:  status post one-vessel CABG and VSD repair. Continue medical therapy.  5. Diabetes mellitus: Diabetes control has gradually improved over the last few months.   Disposition:   follow-up with me in 1 month  Signed,  [name], MD  09/11/2016 10:48 AM    [place]

## 2016-09-19 NOTE — Interval H&P Note (Signed)
History and Physical Interval Note:  09/19/2016 10:59 AM  William [name]  has presented today for surgery, with the diagnosis of pvd   The various methods of treatment have been discussed with the patient and family. After consideration of risks, benefits and other options for treatment, the patient has consented to  Procedure(s): Abdominal Aortogram w/Lower Extremity (N/A) as a surgical intervention .  The patient's history has been reviewed, patient examined, no change in status, stable for surgery.  I have reviewed the patient's chart and labs.  Questions were answered to the patient's satisfaction.     [name]

## 2016-09-20 ENCOUNTER — Encounter (HOSPITAL_COMMUNITY): Payer: Self-pay | Admitting: Cardiovascular Disease

## 2016-09-21 ENCOUNTER — Other Ambulatory Visit: Payer: Self-pay | Admitting: Family Medicine

## 2016-09-21 ENCOUNTER — Other Ambulatory Visit: Payer: Self-pay

## 2016-09-21 ENCOUNTER — Other Ambulatory Visit (INDEPENDENT_AMBULATORY_CARE_PROVIDER_SITE_OTHER): Payer: Medicare Other

## 2016-09-21 DIAGNOSIS — R768 Other specified abnormal immunological findings in serum: Secondary | ICD-10-CM | POA: Diagnosis not present

## 2016-09-21 DIAGNOSIS — Z794 Long term (current) use of insulin: Secondary | ICD-10-CM

## 2016-09-21 DIAGNOSIS — E118 Type 2 diabetes mellitus with unspecified complications: Secondary | ICD-10-CM

## 2016-09-21 DIAGNOSIS — I10 Essential (primary) hypertension: Secondary | ICD-10-CM

## 2016-09-21 MED ORDER — SPIRONOLACTONE 25 MG PO TABS: 12.5000 mg | ORAL_TABLET | Freq: Every day | ORAL | 3 refills | Status: DC

## 2016-09-21 MED ORDER — NITROGLYCERIN 0.4 MG/HR TD PT24: 0.4000 mg | MEDICATED_PATCH | Freq: Every day | TRANSDERMAL | 3 refills | Status: DC

## 2016-09-21 MED ORDER — TORSEMIDE 20 MG PO TABS: 20.0000 mg | ORAL_TABLET | ORAL | 3 refills | Status: DC

## 2016-09-21 MED ORDER — PRAMIPEXOLE DIHYDROCHLORIDE 0.125 MG PO TABS: 0.1250 mg | ORAL_TABLET | Freq: Every day | ORAL | 3 refills | Status: DC

## 2016-09-21 MED ORDER — INSULIN PEN NEEDLE 31G X 8 MM MISC: 1.0000 | Freq: Four times a day (QID) | 12 refills | Status: DC

## 2016-09-21 MED ORDER — WARFARIN SODIUM 5 MG PO TABS: ORAL_TABLET | ORAL | 2 refills | Status: DC

## 2016-09-21 MED ORDER — INSULIN ASPART 100 UNIT/ML FLEXPEN: PEN_INJECTOR | SUBCUTANEOUS | 11 refills | Status: DC

## 2016-09-21 MED ORDER — SSD 1 % EX CREA: TOPICAL_CREAM | Freq: Every day | CUTANEOUS | 1 refills | Status: DC

## 2016-09-21 MED ORDER — GABAPENTIN 300 MG PO CAPS: 300.0000 mg | ORAL_CAPSULE | ORAL | 3 refills | Status: DC

## 2016-09-21 MED ORDER — CARVEDILOL 3.125 MG PO TABS: 3.1250 mg | ORAL_TABLET | Freq: Two times a day (BID) | ORAL | 3 refills | Status: DC

## 2016-09-21 MED ORDER — ASPIRIN EC 81 MG PO TBEC: 81.0000 mg | DELAYED_RELEASE_TABLET | Freq: Every day | ORAL | 3 refills | Status: DC

## 2016-09-21 MED ORDER — INSULIN DETEMIR 100 UNIT/ML FLEXPEN: 60.0000 [IU] | PEN_INJECTOR | Freq: Every day | SUBCUTANEOUS | 11 refills | Status: DC

## 2016-09-21 NOTE — Progress Notes (Unsigned)
hc

## 2016-09-25 ENCOUNTER — Other Ambulatory Visit (HOSPITAL_COMMUNITY): Payer: Self-pay | Admitting: Internal Medicine

## 2016-09-26 LAB — HEPATITIS C VRS RNA DETECT BY PCR-QUAL: Hepatitis C Vrs RNA by PCR-Qual: NOT DETECTED

## 2016-09-27 ENCOUNTER — Telehealth: Payer: Self-pay | Admitting: Family Medicine

## 2016-09-27 NOTE — Telephone Encounter (Signed)
[name], a 66 year old male with a history of DMII, dyslipidemia, and hypertension was recently screened routinely for hepatitis C. Hepatitis C screen was reactive, sent for confirmation by PCR. Confirmation test was negative. Discussed results with daughter, Con Memos.   Donia Pounds  MSN, FNP-C [facility] [address] [place], [place] [address] [phone]

## 2016-09-28 DIAGNOSIS — Z961 Presence of intraocular lens: Secondary | ICD-10-CM | POA: Diagnosis not present

## 2016-09-28 DIAGNOSIS — H35363 Drusen (degenerative) of macula, bilateral: Secondary | ICD-10-CM | POA: Diagnosis not present

## 2016-09-28 DIAGNOSIS — H02833 Dermatochalasis of right eye, unspecified eyelid: Secondary | ICD-10-CM | POA: Diagnosis not present

## 2016-09-28 DIAGNOSIS — E113293 Type 2 diabetes mellitus with mild nonproliferative diabetic retinopathy without macular edema, bilateral: Secondary | ICD-10-CM | POA: Diagnosis not present

## 2016-10-03 ENCOUNTER — Ambulatory Visit (INDEPENDENT_AMBULATORY_CARE_PROVIDER_SITE_OTHER): Payer: Medicare Other | Admitting: Pharmacist Clinician (PhC)/ Clinical Pharmacy Specialist

## 2016-10-03 DIAGNOSIS — I2510 Atherosclerotic heart disease of native coronary artery without angina pectoris: Secondary | ICD-10-CM

## 2016-10-03 DIAGNOSIS — Z5181 Encounter for therapeutic drug level monitoring: Secondary | ICD-10-CM

## 2016-10-03 DIAGNOSIS — I480 Paroxysmal atrial fibrillation: Secondary | ICD-10-CM | POA: Diagnosis not present

## 2016-10-03 LAB — POCT INR: INR: 1.3

## 2016-10-08 ENCOUNTER — Ambulatory Visit (INDEPENDENT_AMBULATORY_CARE_PROVIDER_SITE_OTHER): Payer: Medicare Other | Admitting: Orthopedic Surgery

## 2016-10-08 ENCOUNTER — Other Ambulatory Visit (INDEPENDENT_AMBULATORY_CARE_PROVIDER_SITE_OTHER): Payer: Self-pay | Admitting: Family

## 2016-10-08 ENCOUNTER — Telehealth (INDEPENDENT_AMBULATORY_CARE_PROVIDER_SITE_OTHER): Payer: Self-pay | Admitting: Orthopedic Surgery

## 2016-10-08 DIAGNOSIS — I2510 Atherosclerotic heart disease of native coronary artery without angina pectoris: Secondary | ICD-10-CM

## 2016-10-08 DIAGNOSIS — L97529 Non-pressure chronic ulcer of other part of left foot with unspecified severity: Secondary | ICD-10-CM

## 2016-10-08 DIAGNOSIS — E11621 Type 2 diabetes mellitus with foot ulcer: Secondary | ICD-10-CM | POA: Diagnosis not present

## 2016-10-08 MED ORDER — SSD 1 % EX CREA: TOPICAL_CREAM | Freq: Every day | CUTANEOUS | 1 refills | Status: DC

## 2016-10-08 NOTE — Progress Notes (Signed)
Office Visit Note   Patient: William [name]           Date of Birth: 01/07/1951           MRN: [id number] Visit Date: 10/08/2016              Requested by: Dorena Dew, FNP [address], [place] [address] PCP: Dorena Dew, FNP  No chief complaint on file.     HPI: The patient is a 65 year old gentleman who presents today for evaluation of left great toe ulcer. Has been seen in by vascular and has adequate flow.   Has been appling silvadene 3 times daily to left toe ulcer. Today presents with no dressing in a sock with a postop shoe.  Assessment & Plan: Visit Diagnoses:  1. Diabetic ulcer of left great toe ([place])     Plan: Continue with daily Silvadene dressing changes. Keep the dressing on around the clock except for when bathing. Will follow-up in office in 3-4 weeks. Tinea nitroglycerin patches.  Follow-Up Instructions: Return in about 3 weeks (around 10/29/2016).   Ortho Exam  Patient is alert, oriented, no adenopathy, well-dressed, normal affect, normal respiratory effort. Left great toe there is distal ulceration this is filled in with eschar there is surrounding callus. The callus and nonviable tissue were hard with a 10 blade knife back to viable tissue there is bleeding. Have applied a Iodosorb dressing. There is no palpable abscess no drainage no erythema. Unable to palpate DP or PT pulse.  Imaging: No results found.  Labs: Lab Results  Component Value Date   HGBA1C 9.3 09/14/2016   HGBA1C 9.3 08/07/2016   HGBA1C 9.2 07/06/2016   REPTSTATUS 06/26/2016 FINAL 06/21/2016   GRAMSTAIN  03/14/2016    RARE WBC PRESENT, PREDOMINANTLY PMN NO ORGANISMS SEEN    CULT NO GROWTH 5 DAYS 06/21/2016   LABORGA PSEUDOMONAS AERUGINOSA 03/14/2016    Orders:  No orders of the defined types were placed in this encounter.  Meds ordered this encounter  Medications  . SSD 1 % cream    Sig: Apply topically daily.    Dispense:  50 g    Refill:  1      Procedures: No procedures performed  Clinical Data: No additional findings.  ROS:  All other systems negative, except as noted in the HPI. Review of Systems  Constitutional: Negative for chills and fever.  Skin: Positive for wound. Negative for color change.    Objective: Vital Signs: There were no vitals taken for this visit.  Specialty Comments:  No specialty comments available.  PMFS History: Patient Active Problem List   Diagnosis Date Noted  . Diabetic ulcer of left great toe ([place]) 09/17/2016  . Peripheral vascular disorder ([place]) 09/17/2016  . S/P transmetatarsal amputation of foot, right ([place]) 07/06/2016  . Abnormality of gait 07/06/2016  . CKD (chronic kidney disease) stage 3, GFR 30-59 ml/min   . Osteomyelitis ([place]) 06/21/2016  . Sepsis, unspecified organism ([place]) 06/21/2016  . AKI (acute kidney injury) ([place]) 06/21/2016  . Anemia 06/21/2016  . Hyponatremia 06/21/2016  . Atherosclerosis of native artery of right lower extremity with gangrene ([place]) 06/05/2016  . Chronic systolic HF (heart failure) ([place]) 05/28/2016  . Hypoalbuminemia 03/21/2016  . Coronary artery disease due to lipid rich plaque 03/16/2016  . VSD (ventricular septal defect) 03/16/2016  . Hx of CABG   . Paroxysmal atrial fibrillation ([place]) 03/15/2016  . Erectile dysfunction associated with type 2 diabetes mellitus ([place])  04/07/2012  . Mood disorder ([place]) 03/12/2011  . Essential hypertension 03/17/2010  . ONYCHOMYCOSIS, TOENAILS 01/03/2010  . NICOTINE ADDICTION 12/28/2008  . Hyperlipidemia 11/01/2008  . GERD 11/01/2008  . SHOULDER PAIN, LEFT, CHRONIC 11/01/2008  . NECK PAIN, CHRONIC 11/01/2008  . Type 2 diabetes mellitus with diabetic peripheral angiopathy and gangrene, with long-term current use of insulin ([place]) 09/23/2008   Past Medical History:  Diagnosis Date  . AKI (acute kidney injury) ([place])    With STEMI in 2017  . Chronic systolic CHF (congestive heart failure) ([place])   .  Diabetes mellitus without complication ([place])   . Hyperlipidemia   . Hypertension   . Paroxysmal atrial fibrillation ([name])   . STEMI (ST elevation myocardial infarction) ([place]) 2017    Family History  Problem Relation Age of Onset  . Diabetes Maternal Grandmother   . Diabetes Mother   . Aneurysm Mother   . Peripheral Artery Disease Mother   . Coronary artery disease Mother   . Peptic Ulcer Father     Past Surgical History:  Procedure Laterality Date  . ABDOMINAL AORTOGRAM W/LOWER EXTREMITY N/A 09/19/2016   Procedure: Abdominal Aortogram w/Lower Extremity;  Surgeon: Wellington Hampshire, MD;  Location: [facility];  Service: Cardiovascular;  Laterality: N/A;  . AMPUTATION Right 03/23/2016   Procedure: 1st and 2nd Ray Amputation Right Foot;  Surgeon: Newt Minion, MD;  Location: [facility];  Service: Orthopedics;  Laterality: Right;  . AMPUTATION Right 06/21/2016   Procedure: RIGHT TRANSMETATARSAL AMPUTATION;  Surgeon: Newt Minion, MD;  Location: [facility];  Service: Orthopedics;  Laterality: Right;  . CARDIAC CATHETERIZATION N/A 03/13/2016   Procedure: Right/Left Heart Cath and Coronary Angiography;  Surgeon: Sherren Mocha, MD;  Location: [facility];  Service: Cardiovascular;  Laterality: N/A;  . CARDIAC CATHETERIZATION N/A 03/13/2016   Procedure: IABP Insertion;  Surgeon: Sherren Mocha, MD;  Location: [facility];  Service: Cardiovascular;  Laterality: N/A;  . CORONARY ARTERY BYPASS GRAFT N/A 03/13/2016   Procedure: CORONARY ARTERY BYPASS GRAFTING (CABG) x 1 (SVG to OM) with EVH from [place];  Surgeon: Ivin Poot, MD;  Location: [facility];  Service: Open Heart Surgery;  Laterality: N/A;  . LOWER EXTREMITY ANGIOGRAM  05/02/2016   Procedure: Lower Extremity Angiogram;  Surgeon: Wellington Hampshire, MD;  Location: [facility];  Service: Cardiovascular;;  Limited left femoral runoff right femoral runoff  . PERIPHERAL VASCULAR CATHETERIZATION N/A 05/02/2016    Procedure: Abdominal Aortogram;  Surgeon: Wellington Hampshire, MD;  Location: [facility];  Service: Cardiovascular;  Laterality: N/A;  . PERIPHERAL VASCULAR CATHETERIZATION Right 05/02/2016   Procedure: Peripheral Vascular Balloon Angioplasty;  Surgeon: Wellington Hampshire, MD;  Location: [facility];  Service: Cardiovascular;  Laterality: Right;  SFA  . TEE WITHOUT CARDIOVERSION N/A 03/13/2016   Procedure: TRANSESOPHAGEAL ECHOCARDIOGRAM (TEE);  Surgeon: Ivin Poot, MD;  Location: [facility];  Service: Open Heart Surgery;  Laterality: N/A;  . VSD REPAIR N/A 03/13/2016   Procedure: VENTRICULAR SEPTAL DEFECT (VSD) REPAIR;  Surgeon: Ivin Poot, MD;  Location: [facility];  Service: Open Heart Surgery;  Laterality: N/A;   Social History   Occupational History  . Not on file.   Social History Main Topics  . Smoking status: Former Smoker    Packs/day: 0.50    Types: Cigarettes    Quit date: 11/20/2015  . Smokeless tobacco: Never Used  . Alcohol use No  .  Drug use: No  . Sexual activity: Not on file       

## 2016-10-08 NOTE — Telephone Encounter (Signed)
Patient daughter [name]) called advised she went to the pharmacy and the Silverdene cream was not there yet. Patient uses the [facility] off of [address].  Phamacy phone # [phone]   # to contact [name] is [phone]

## 2016-10-08 NOTE — Telephone Encounter (Signed)
I called pharmacy they did receive just now and will fill for the patient.

## 2016-10-09 ENCOUNTER — Ambulatory Visit (INDEPENDENT_AMBULATORY_CARE_PROVIDER_SITE_OTHER): Payer: Medicare Other | Admitting: Cardiovascular Disease

## 2016-10-09 ENCOUNTER — Ambulatory Visit (INDEPENDENT_AMBULATORY_CARE_PROVIDER_SITE_OTHER): Payer: Medicare Other | Admitting: Pharmacist

## 2016-10-09 ENCOUNTER — Encounter: Payer: Self-pay | Admitting: Cardiovascular Disease

## 2016-10-09 VITALS — BP 110/78 | HR 80 | Ht 65.0 in | Wt 153.0 lb

## 2016-10-09 DIAGNOSIS — Z5181 Encounter for therapeutic drug level monitoring: Secondary | ICD-10-CM

## 2016-10-09 DIAGNOSIS — I480 Paroxysmal atrial fibrillation: Secondary | ICD-10-CM

## 2016-10-09 DIAGNOSIS — I5022 Chronic systolic (congestive) heart failure: Secondary | ICD-10-CM

## 2016-10-09 DIAGNOSIS — I739 Peripheral vascular disease, unspecified: Secondary | ICD-10-CM | POA: Diagnosis not present

## 2016-10-09 DIAGNOSIS — I2510 Atherosclerotic heart disease of native coronary artery without angina pectoris: Secondary | ICD-10-CM

## 2016-10-09 LAB — POCT INR: INR: 1.6

## 2016-10-09 NOTE — Patient Instructions (Signed)
Medication Instructions:  Your physician recommends that you continue on your current medications as directed. Please refer to the Current Medication list given to you today.  Labwork: No new orders.   Testing/Procedures: No new orders.   Follow-Up: Your physician recommends that you schedule a follow-up appointment in: 2 MONTHS with Dr Arida   Any Other Special Instructions Will Be Listed Below (If Applicable).     If you need a refill on your cardiac medications before your next appointment, please call your pharmacy.   

## 2016-10-09 NOTE — Progress Notes (Signed)
Cardiology Office Note   Date:  10/09/2016   ID:  William [name], DOB 1951/04/02, MRN [id number]  PCP:  Dorena Dew, FNP  Cardiologist:   [name], MD   Chief Complaint  Patient presents with  . Follow-up    follow up from angiogram      History of Present Illness: Forrest Jaroszewski is a 66 y.o. male who Is here today for a follow-up visit regarding peripheral arterial disease. He has known history of inferior myocardial infarction complicated by VSD in September, 2017. He is status post one-vessel CABG and VSD repair. He had postoperative atrial fibrillation and has been on anticoagulation. He had amputation of the right great toe while hospitalized for gangrene. The patient has known history of diabetes.  He is known to have peripheral arterial disease. Angiography in November showed no significant aortoiliac disease, significant right distal SFA stenosis and one-vessel runoff below the knee via the peroneal artery with reconstitution of the dorsalis pedis distally. I performed successful drug-coated balloon angioplasty of the right SFA.  He was hospitalized in December for osteomyelitis of the right foot. He underwent right transmetatarsal amputation without complications. The amputation site is Almost completely healed.  He developed fungal infection on his left big toe. The nail was removed With very slow healing since then. Thus, I proceeded with left lower extremity angiography. Weeks ago which showed moderate stenosis affecting the left popliteal artery and TP trunk with 1 vessel runoff below the knee via the peroneal artery which was a large dominant vessel and reconstituted the dorsalis pedis and posterior tibial at the level of the ankle. There was brisk flow to the toes. Thus, no revascularization was performed. He has been doing reasonably well with improvement in the ulceration. No chest pain or shortness of breath.  Past Medical History:  Diagnosis Date  . AKI (acute  kidney injury) ([place])    With STEMI in 2017  . Chronic systolic CHF (congestive heart failure) ([place])   . Diabetes mellitus without complication ([place])   . Hyperlipidemia   . Hypertension   . Paroxysmal atrial fibrillation ([name])   . STEMI (ST elevation myocardial infarction) ([place]) 2017    Past Surgical History:  Procedure Laterality Date  . ABDOMINAL AORTOGRAM W/LOWER EXTREMITY N/A 09/19/2016   Procedure: Abdominal Aortogram w/Lower Extremity;  Surgeon: Wellington Hampshire, MD;  Location: [facility];  Service: Cardiovascular;  Laterality: N/A;  . AMPUTATION Right 03/23/2016   Procedure: 1st and 2nd Ray Amputation Right Foot;  Surgeon: Newt Minion, MD;  Location: [facility];  Service: Orthopedics;  Laterality: Right;  . AMPUTATION Right 06/21/2016   Procedure: RIGHT TRANSMETATARSAL AMPUTATION;  Surgeon: Newt Minion, MD;  Location: [facility];  Service: Orthopedics;  Laterality: Right;  . CARDIAC CATHETERIZATION N/A 03/13/2016   Procedure: Right/Left Heart Cath and Coronary Angiography;  Surgeon: Sherren Mocha, MD;  Location: [facility];  Service: Cardiovascular;  Laterality: N/A;  . CARDIAC CATHETERIZATION N/A 03/13/2016   Procedure: IABP Insertion;  Surgeon: Sherren Mocha, MD;  Location: [facility];  Service: Cardiovascular;  Laterality: N/A;  . CORONARY ARTERY BYPASS GRAFT N/A 03/13/2016   Procedure: CORONARY ARTERY BYPASS GRAFTING (CABG) x 1 (SVG to OM) with EVH from [facility];  Surgeon: Ivin Poot, MD;  Location: [facility];  Service: Open Heart Surgery;  Laterality: N/A;  . LOWER EXTREMITY ANGIOGRAM  05/02/2016   Procedure: Lower Extremity Angiogram;  Surgeon: Wellington Hampshire, MD;  Location: [facility];  Service: Cardiovascular;;  Limited left femoral runoff right femoral runoff  . PERIPHERAL VASCULAR CATHETERIZATION N/A 05/02/2016   Procedure: Abdominal Aortogram;  Surgeon: Wellington Hampshire, MD;  Location: [facility];  Service:  Cardiovascular;  Laterality: N/A;  . PERIPHERAL VASCULAR CATHETERIZATION Right 05/02/2016   Procedure: Peripheral Vascular Balloon Angioplasty;  Surgeon: Wellington Hampshire, MD;  Location: [facility];  Service: Cardiovascular;  Laterality: Right;  SFA  . TEE WITHOUT CARDIOVERSION N/A 03/13/2016   Procedure: TRANSESOPHAGEAL ECHOCARDIOGRAM (TEE);  Surgeon: Ivin Poot, MD;  Location: [facility];  Service: Open Heart Surgery;  Laterality: N/A;  . VSD REPAIR N/A 03/13/2016   Procedure: VENTRICULAR SEPTAL DEFECT (VSD) REPAIR;  Surgeon: Ivin Poot, MD;  Location: [facility];  Service: Open Heart Surgery;  Laterality: N/A;     Current Outpatient Prescriptions  Medication Sig Dispense Refill  . aspirin EC 81 MG tablet Take 1 tablet (81 mg total) by mouth daily. 30 tablet 3  . Blood Glucose Monitoring Suppl (ACCU-CHEK AVIVA PLUS) w/Device KIT 1 each by Does not apply route 4 (four) times daily -  before meals and at bedtime. 1 kit 0  . carvedilol (COREG) 3.125 MG tablet Take 1 tablet (3.125 mg total) by mouth 2 (two) times daily. 180 tablet 3  . gabapentin (NEURONTIN) 300 MG capsule Take 1 capsule (300 mg total) by mouth every morning. 30 capsule 3  . glucose blood (ACCU-CHEK AVIVA) test strip Check blood sugars three times per day prior to meals and bedtime 360 each 5  . insulin aspart (NOVOLOG) 100 UNIT/ML FlexPen Per sliding scale 15 mL 11  . Insulin Detemir (LEVEMIR FLEXPEN) 100 UNIT/ML Pen Inject 60 Units into the skin daily at 10 pm. 15 mL 11  . Insulin Pen Needle 31G X 8 MM MISC 1 each by Does not apply route 4 (four) times daily. 100 each 12  . Lancets (ACCU-CHEK SOFT TOUCH) lancets Use as instructed 100 each 12  . nitroGLYCERIN (NITRODUR - DOSED IN MG/24 HR) 0.4 mg/hr patch Place 1 patch (0.4 mg total) onto the skin daily. 30 patch 3  . pantoprazole (PROTONIX) 40 MG tablet Take 40 mg by mouth daily.    . pramipexole (MIRAPEX) 0.125 MG tablet Take 1 tablet (0.125 mg total) by mouth at  bedtime. 30 tablet 3  . rosuvastatin (CRESTOR) 40 MG tablet take 1 tablet by mouth once daily 30 tablet 0  . spironolactone (ALDACTONE) 25 MG tablet Take 0.5 tablets (12.5 mg total) by mouth daily. 30 tablet 3  . SSD 1 % cream Apply topically daily. 50 g 1  . SSD 1 % cream apply to affected area topically once daily 50 g 1  . torsemide (DEMADEX) 20 MG tablet Take 1 tablet (20 mg total) by mouth 3 (three) times a week. Every Mon, Wed and Fri 30 tablet 3  . traMADol (ULTRAM) 50 MG tablet Take 50 mg by mouth 2 (two) times daily as needed for moderate pain.    [name] warfarin (COUMADIN) 5 MG tablet Take 3ms once daily on Tues, Thurs, Sat, and Sun; take  7.566m once daily on Mon, Wed, Fri 40 tablet 2   No current facility-administered medications for this visit.     Allergies:   Morphine and related and Latex    Social History:  The patient  reports that he quit smoking about 10 months ago. His smoking use included Cigarettes. He smoked 0.50 packs per day.  He has never used smokeless tobacco. He reports that he does not drink alcohol or use drugs.   Family History:  Not able to obtain due to distress.  ROS:  Please see the history of present illness.   Otherwise, review of systems are positive for none.   All other systems are reviewed and negative.    PHYSICAL EXAM: VS:  BP 110/78   Pulse 80   Ht '5\' 5"'  (1.651 m)   Wt 153 lb (69.4 kg)   SpO2 100%   BMI 25.46 kg/m  , BMI Body mass index is 25.46 kg/m. GEN: Well nourished, well developed, in no acute distress  HEENT: normal  Neck: no JVD, carotid bruits, or masses Cardiac: RRR; no  rubs, or gallops,no edema . No murmurs Respiratory:  clear to auscultation bilaterally, normal work of breathing GI: soft, nontender, nondistended, + BS MS: no deformity or atrophy  Skin: warm and dry, no rash Neuro:  Strength and sensation are intact Psych: euthymic mood, full affect No groin hematoma Vascular: Distal pulses are not palpable.   EKG:  EKG  is not ordered today.    Recent Labs: 06/22/2016: Magnesium 2.3 08/07/2016: ALT 28 09/19/2016: BUN 26; Creatinine, Ser 1.44; Hemoglobin 10.5; Platelets 182; Potassium 4.2; Sodium 137    Lipid Panel    Component Value Date/Time   CHOL 96 03/13/2016 1057   TRIG 146 03/13/2016 1057   HDL 23 (L) 03/13/2016 1057   CHOLHDL 4.2 03/13/2016 1057   VLDL 29 03/13/2016 1057   LDLCALC 44 03/13/2016 1057      Wt Readings from Last 3 Encounters:  10/09/16 153 lb (69.4 kg)  09/19/16 150 lb (68 kg)  09/17/16 152 lb (68.9 kg)      No flowsheet data found.    ASSESSMENT AND PLAN:  1.  Peripheral arterial disease :  He is status post recent drug-coated balloon angioplasty of the right SFA with good results. He is s/p right transmetatarsal amputation for osteomyelitis with good healing . The ulceration on the left big toe is improving gradually. Recent angiography showed brisk flow to the toes. I recommend continuing medical therapy. I reviewed his angiography again. Revascularization of the anterior tibial and posterior tibial arteries is not straightforward and likely not needed.  2. Chronic systolic heart failure: He appears to be euvolemic.   He is doing very well overall and appears to be euvolemic. His blood pressure might not allow the addition of an ACE inhibitor or ARB. He has a follow-up appointment with the heart failure clinic.  3.Postoperative atrial fibrillation: Currently in sinus rhythm.He is on anticoagulation with warfarin.  4. Coronary artery disease involving native coronary arteries without angina:  status post one-vessel CABG and VSD repair. Continue medical therapy.  5. Diabetes mellitus: Diabetes control has gradually improved over the last few months.   Disposition:   follow-up with me in 2 months  Signed,  [name], MD  10/09/2016 10:58 AM    [place]

## 2016-10-10 ENCOUNTER — Ambulatory Visit (HOSPITAL_COMMUNITY)
Admission: RE | Admit: 2016-10-10 | Discharge: 2016-10-10 | Disposition: A | Discharge status: Home / Self Care | Payer: Medicare Other | Source: Ambulatory Visit | Attending: Cardiology | Admitting: Cardiology

## 2016-10-10 ENCOUNTER — Encounter (HOSPITAL_COMMUNITY): Payer: Self-pay

## 2016-10-10 VITALS — BP 122/76 | HR 96 | Wt 153.4 lb

## 2016-10-10 DIAGNOSIS — Z89411 Acquired absence of right great toe: Secondary | ICD-10-CM | POA: Insufficient documentation

## 2016-10-10 DIAGNOSIS — Z7901 Long term (current) use of anticoagulants: Secondary | ICD-10-CM | POA: Insufficient documentation

## 2016-10-10 DIAGNOSIS — L97529 Non-pressure chronic ulcer of other part of left foot with unspecified severity: Secondary | ICD-10-CM | POA: Insufficient documentation

## 2016-10-10 DIAGNOSIS — Z951 Presence of aortocoronary bypass graft: Secondary | ICD-10-CM | POA: Insufficient documentation

## 2016-10-10 DIAGNOSIS — Z87891 Personal history of nicotine dependence: Secondary | ICD-10-CM | POA: Diagnosis not present

## 2016-10-10 DIAGNOSIS — E11621 Type 2 diabetes mellitus with foot ulcer: Secondary | ICD-10-CM | POA: Insufficient documentation

## 2016-10-10 DIAGNOSIS — I951 Orthostatic hypotension: Secondary | ICD-10-CM | POA: Diagnosis not present

## 2016-10-10 DIAGNOSIS — I5022 Chronic systolic (congestive) heart failure: Secondary | ICD-10-CM | POA: Diagnosis not present

## 2016-10-10 DIAGNOSIS — E785 Hyperlipidemia, unspecified: Secondary | ICD-10-CM | POA: Diagnosis not present

## 2016-10-10 DIAGNOSIS — I480 Paroxysmal atrial fibrillation: Secondary | ICD-10-CM | POA: Insufficient documentation

## 2016-10-10 DIAGNOSIS — I2510 Atherosclerotic heart disease of native coronary artery without angina pectoris: Secondary | ICD-10-CM | POA: Insufficient documentation

## 2016-10-10 MED ORDER — LOSARTAN POTASSIUM 25 MG PO TABS: 12.5000 mg | ORAL_TABLET | Freq: Every day | ORAL | 3 refills | Status: DC

## 2016-10-10 NOTE — Patient Instructions (Addendum)
It was great to see you today!  Please start taking Losartan 1/2 tablet (12.5 mg) DAILY IN THE EVENING.   You have a lab appointment with [place] on Tuesday 4/24 at 9:15 am before your [facility] Clinic appointment.  Please keep your appointment with Dr. Haroldine Laws on 12/11/16.

## 2016-10-10 NOTE — Progress Notes (Signed)
HF MD: BENSIMHON  HPI:  William [name] is a 66 year old Caucasian male with a history of CAD with inferior MI complicated by acute VSD and cardiogenic shock, S/P CABG x1 with VSD repair on 03/13/16, S/P R 1st and 2nd toe amputation 03/24/2016 followed by R transmetatarsal amputation 12/17, DMII, PAF, and hyperlipidemia.    Admitted September 2017 with chest pain. Inferior MI c/b acute VSD and cardiogenic shock required CABG x 1 with VSD repair on 03/13/2016. Post op course prolonged due to AF and cardiogenic shock due to RV failure  Slow wean off milrinone due low mixed venous saturation. Also had amputation of R 1st and 2nd toe for osteo.  Discharge weight was 150 pounds. He was not discharged on bb or ace with hypotension.   Admitted in 12/17 with sepsis due to R foot gangrene. Underwent R transmetatarsal amputation.  Now with left great toe ulcer managed by Dr. Fletcher Anon.   He presents today for pharmacist-led HF medication titration with his daughter, [name], who manages his medications for him. At last HF clinic visit on 09/10/16, digoxin and midodrine were discontinued. Also noted that he has actually been taking a full spironolactone 25 mg tablet daily instead of the 12.5 mg daily but still feels very well. No dizziness at all. No SOB or swelling. Even doing yard work this past weekend. Now out of WC and walking with cane (still being treated for left great toe ulcer). Took all meds this am and reports excellent compliance. Blood sugars much improved. Still not smoking since May 2017. Will be moving with his wife, daughter and 3 grandchildren to [place] in mid-June.     . Shortness of breath/dyspnea on exertion? no  . Orthopnea/PND? no . Edema? no . Lightheadedness/dizziness? no . Daily weights at home? yes . Blood pressure/heart rate monitoring at home? yes . Following low-sodium/fluid-restricted diet? Yes - now eating more vegetables (carrot fries, baked asparagus, etc that his daughter cooks for  him)  HF Medications: Carvedilol 3.125 mg PO BID Spironolactone 25 mg PO daily  Torsemide 20 mg PO Mon/Wed/Fri  Has the patient been experiencing any side effects to the medications prescribed?  no  Does the patient have any problems obtaining medications due to transportation or finances?   No - Medicare + Medicaid  Understanding of regimen: fair Understanding of indications: fair Potential of compliance: good Patient understands to avoid NSAIDs. Patient understands to avoid decongestants.    Pertinent Lab Values: . 09/19/16: Serum creatinine 1.44 (BL ~1.4-1.5), BUN 26, Potassium 4.2, Sodium 137  Vital Signs: . Weight: 153 lb (dry weight: 150 lb) . Blood pressure: 122/76 mmHg   . Heart rate: 96 bpm    Assessment: 1. Chronicsystolic CHF (EF 93-23%), due to ICM. NYHA class IIsymptoms.  - Volume status stable  - BP much improved, no longer having dizziness. Off of midodrine and digoxin   - Will start losartan 12.5 mg daily in the evening  - Continue carvedilol 3.125 mg BID, spironolactone 25 mg daily (since tolerating full tab), and torsemide 20 mg Mon/Wed/Fri - Basic disease state pathophysiology, medication indication, mechanism and side effects reviewed at length with patient and he verbalized understanding 2. Inferior STEMI CAD-->S/P CABG x1 VSD Repair with RV failure in 9/17 EF 40-45% on 9/17 echo.   - No CP  - On high potency statin, ASA, BB, spiro and adding ARB 3. PAD Ischemic R Great Toe s/p amputation 9/22 followed by R transmet amputation 12/17  - Managed by Dr.  Arida  - Currently being treated for left great toe ulcer 4. Hyperlipidemia  - Continue high potency statin 5. DM Type II  -  Per PCP. Last A1c 09/14/16 still elevated at 9.3%  - Home BG much improved (this am fasting 126) 6. PAF  - Maintaining NSR  - On coumadin followed by Coumadin Clinic 7. RV failure  - RV improved on echo 8. Orthostatic hypotension  - Improved, now off midodrine  Plan: 1)  Medication changes: Based on clinical presentation, vital signs and recent labs will start losartan 12.5 mg daily in the evening 2) Labs: BMET in 10 days 3) Follow-up: Dr. Haroldine Laws on 12/11/16   Ruta Hinds. Velva Harman, PharmD, BCPS, CPP Clinical Pharmacist Pager: [phone] Phone: [phone] 10/10/2016 1:07 PM   Agree.  Glori Bickers, MD  12:36 PM

## 2016-10-17 ENCOUNTER — Ambulatory Visit (INDEPENDENT_AMBULATORY_CARE_PROVIDER_SITE_OTHER): Payer: Medicare Other | Admitting: Family Medicine

## 2016-10-17 ENCOUNTER — Encounter: Payer: Self-pay | Admitting: Family Medicine

## 2016-10-17 VITALS — BP 98/55 | HR 88 | Temp 97.7°F | Resp 20 | Ht 65.0 in | Wt 157.0 lb

## 2016-10-17 DIAGNOSIS — G629 Polyneuropathy, unspecified: Secondary | ICD-10-CM | POA: Diagnosis not present

## 2016-10-17 DIAGNOSIS — E1152 Type 2 diabetes mellitus with diabetic peripheral angiopathy with gangrene: Secondary | ICD-10-CM | POA: Diagnosis not present

## 2016-10-17 DIAGNOSIS — I2510 Atherosclerotic heart disease of native coronary artery without angina pectoris: Secondary | ICD-10-CM

## 2016-10-17 DIAGNOSIS — Z794 Long term (current) use of insulin: Secondary | ICD-10-CM | POA: Diagnosis not present

## 2016-10-17 MED ORDER — GABAPENTIN 300 MG PO CAPS: 300.0000 mg | ORAL_CAPSULE | Freq: Two times a day (BID) | ORAL | 1 refills | Status: DC

## 2016-10-17 NOTE — Progress Notes (Signed)
Subjective:    Patient ID: William [name], male    DOB: June 11, 1951, 66 y.o.   MRN: [id number]  HPI William [name], a 66 year old male with a history of STEMI in 2017, CHF, diabetes mellitus type 2, hyperlipidemia, and hypertension presents accompanied by daughter complaining of worsening neuropathy.   William [name] has a history of uncontrolled type 2 diabetes mellitus with decreased circulation and diabetic neuropathy. He has been taking Levemir 60 units HS.  His daughter continues to maintain a journal and patient is followed by nutrition and diabetes education as well.  Hyperglycemia has improved.  Patient denies hypoglycemia  and visual disturbances. Previous hemoglobin a1C was 9.3.  Diabetic neuropathy has been worsening in hands over the past several weeks. Patient has been consistently taking Gapapentin at night with minimal relief. Patient also has a history of hypertension. He has decreased activity levels and is not adherent to a low fat, low sodium diet.  His daughter checks blood pressure at home and it is managed by Dr. Marlyne Beards, cardiology.    Past Medical History:  Diagnosis Date  . AKI (acute kidney injury) ([place])    With STEMI in 2017  . Chronic systolic CHF (congestive heart failure) ([place])   . Diabetes mellitus without complication ([place])   . Hyperlipidemia   . Hypertension   . Paroxysmal atrial fibrillation ([name])   . STEMI (ST elevation myocardial infarction) ([place]) 2017    Social History   Social History  . Marital status: Married    Spouse name: Lavell Luster  . Number of children: N/A  . Years of education: N/A   Occupational History  . Not on file.   Social History Main Topics  . Smoking status: Former Smoker    Packs/day: 0.50    Types: Cigarettes    Quit date: 11/20/2015  . Smokeless tobacco: Never Used  . Alcohol use No  . Drug use: No  . Sexual activity: Not on file   Other Topics Concern  . Not on file   Social History Narrative  . No narrative on  file   Immunization History  Administered Date(s) Administered  . Influenza,inj,Quad PF,36+ Mos 03/23/2016  . Pneumococcal Conjugate-13 09/14/2016  . Tdap 09/14/2016   Review of Systems  HENT: Negative.   Eyes: Negative.   Respiratory: Negative.   Cardiovascular: Negative.   Gastrointestinal: Negative.   Endocrine: Negative for polydipsia, polyphagia and polyuria.  Genitourinary: Negative.   Musculoskeletal: Positive for gait problem and myalgias.  Skin:       Healing amputation to right lower extremity. Rash to bilateral lower extremities and low back  Neurological: Positive for numbness (upper and lower extremities).  Hematological: Negative.   Psychiatric/Behavioral: Negative.        Objective:   Physical Exam  Constitutional: He is oriented to person, place, and time.  Eyes: Conjunctivae are normal. Pupils are equal, round, and reactive to light.  Neck: Normal range of motion. Neck supple.  Pulmonary/Chest: Effort normal.  Abdominal: Soft. Bowel sounds are normal.  Musculoskeletal:       Right knee: He exhibits decreased range of motion.       Left knee: He exhibits decreased range of motion.  Forefoot amputation   Patient ambulates with a cane.   Neurological: He is alert and oriented to person, place, and time. He has normal reflexes.  Skin: Skin is warm and dry.  Right foot dressing, unable to assess.   Psychiatric: He has a normal mood and  affect. His behavior is normal. Judgment and thought content normal.       BP (!) 98/55 (BP Location: Left Arm, Patient Position: Sitting, Cuff Size: Normal)   Pulse 88   Temp 97.7 F (36.5 C) (Oral)   Resp 20   Ht 5\' 5"  (1.651 m)   Wt 157 lb (71.2 kg)   SpO2 100%   BMI 26.13 kg/m  Assessment & Plan:  1. Type 2 diabetes mellitus with diabetic peripheral angiopathy and gangrene, with long-term current use of insulin ([name]) Hemoglobin a1c is 9.3.Will continue to monitor. Patient has a follow up next month to for DMII.  Patient is also having difficulty with meal planning. The patient is asked to make an attempt to improve diet and exercise patterns to aid in medical management of this problem. Recommend that patient continue carbohydrate modified diet  2. Neuropathy Will increase gabapentin to 300 mg BID.  - gabapentin (NEURONTIN) 300 MG capsule; Take 1 capsule (300 mg total) by mouth 2 (two) times daily.  Dispense: 120 capsule; Refill: 1   RTC: 1 month for DMII medication management.     Donia Pounds  MSN, FNP-C [facility] [address] [place], [place] [address] [phone]

## 2016-10-17 NOTE — Patient Instructions (Addendum)
Neuropathy:   Will increase Gabapentin to 300 mg three times per day for diabetic neuropathy.    Diabetes mellitus:  Continue medication regimen as previously prescribed  Neuropathic Pain Neuropathic pain is pain caused by damage to the nerves that are responsible for certain sensations in your body (sensory nerves). The pain can be caused by damage to:  The sensory nerves that send signals to your spinal cord and brain (peripheral nervous system).  The sensory nerves in your brain or spinal cord (central nervous system). Neuropathic pain can make you more sensitive to pain. What would be a minor sensation for most people may feel very painful if you have neuropathic pain. This is usually a long-term condition that can be difficult to treat. The type of pain can differ from person to person. It may start suddenly (acute), or it may develop slowly and last for a long time (chronic). Neuropathic pain may come and go as damaged nerves heal or may stay at the same level for years. It often causes emotional distress, loss of sleep, and a lower quality of life. What are the causes? The most common cause of damage to a sensory nerve is diabetes. Many other diseases and conditions can also cause neuropathic pain. Causes of neuropathic pain can be classified as:  Toxic. Many drugs and chemicals can cause toxic damage. The most common cause of toxic neuropathic pain is damage from drug treatment for cancer (chemotherapy).  Metabolic. This type of pain can happen when a disease causes imbalances that damage nerves. Diabetes is the most common of these diseases. Vitamin B deficiency caused by long-term alcohol abuse is another common cause.  Traumatic. Any injury that cuts, crushes, or stretches a nerve can cause damage and pain. A common example is feeling pain after losing an arm or leg (phantom limb pain).  Compression-related. If a sensory nerve gets trapped or compressed for a long period of time,  the blood supply to the nerve can be cut off.  Vascular. Many blood vessel diseases can cause neuropathic pain by decreasing blood supply and oxygen to nerves.  Autoimmune. This type of pain results from diseases in which the body's defense system mistakenly attacks sensory nerves. Examples of autoimmune diseases that can cause neuropathic pain include lupus and multiple sclerosis.  Infectious. Many types of viral infections can damage sensory nerves and cause pain. Shingles infection is a common cause of this type of pain.  Inherited. Neuropathic pain can be a symptom of many diseases that are passed down through families (genetic). What are the signs or symptoms? The main symptom is pain. Neuropathic pain is often described as:  Burning.  Shock-like.  Stinging.  Hot or cold.  Itching. How is this diagnosed? No single test can diagnose neuropathic pain. Your health care provider will do a physical exam and ask you about your pain. You may use a pain scale to describe how bad your pain is. You may also have tests to see if you have a high sensitivity to pain and to help find the cause and location of any sensory nerve damage. These tests may include:  Imaging studies, such as:  X-rays.  CT scan.  MRI.  Nerve conduction studies to test how well nerve signals travel through your sensory nerves (electrodiagnostic testing).  Stimulating your sensory nerves through electrodes on your skin and measuring the response in your spinal cord and brain (somatosensory evoked potentials). How is this treated? Treatment for neuropathic pain may change over time.  You may need to try different treatment options or a combination of treatments. Some options include:  Over-the-counter pain relievers.  Prescription medicines. Some medicines used to treat other conditions may also help neuropathic pain. These include medicines to:  Control seizures (anticonvulsants).  Relieve depression  (antidepressants).  Prescription-strength pain relievers (narcotics). These are usually used when other pain relievers do not help.  Transcutaneous nerve stimulation (TENS). This uses electrical currents to block painful nerve signals. The treatment is painless.  Topical and local anesthetics. These are medicines that numb the nerves. They can be injected as a nerve block or applied to the skin.  Alternative treatments, such as:  Acupuncture.  Meditation.  Massage.  Physical therapy.  Pain management programs.  Counseling. Follow these instructions at home:  Learn as much as you can about your condition.  Take medicines only as directed by your health care provider.  Work closely with all your health care providers to find what works best for you.  Have a good support system at home.  Consider joining a chronic pain support group. Contact a health care provider if:  Your pain treatments are not helping.  You are having side effects from your medicines.  You are struggling with fatigue, mood changes, depression, or anxiety. This information is not intended to replace advice given to you by your health care provider. Make sure you discuss any questions you have with your health care provider. Document Released: 03/15/2004 Document Revised: 01/06/2016 Document Reviewed: 11/26/2013 Elsevier Interactive Patient Education  2017 [employer]  Peripheral Neuropathy Peripheral neuropathy is a type of nerve damage. It affects nerves that carry signals between the spinal cord and other parts of the body. These are called peripheral nerves. With peripheral neuropathy, one nerve or a group of nerves may be damaged. What are the causes? Many things can damage peripheral nerves. For some people with peripheral neuropathy, the cause is unknown. Some causes include:  Diabetes. This is the most common cause of peripheral neuropathy.  Injury to a nerve.  Pressure or stress on a  nerve that lasts a long time.  Too little vitamin B. Alcoholism can lead to this.  Infections.  Autoimmune diseases, such as multiple sclerosis and systemic lupus erythematosus.  Inherited nerve diseases.  Some medicines, such as cancer drugs.  Toxic substances, such as lead and mercury.  Too little blood flowing to the legs.  Kidney disease.  Thyroid disease. What are the signs or symptoms? Different people have different symptoms. The symptoms you have will depend on which of your nerves is damaged. Common symptoms include:  Loss of feeling (numbness) in the feet and hands.  Tingling in the feet and hands.  Pain that burns.  Very sensitive skin.  Weakness.  Not being able to move a part of the body (paralysis).  Muscle twitching.  Clumsiness or poor coordination.  Loss of balance.  Not being able to control your bladder.  Feeling dizzy.  Sexual problems. How is this diagnosed? Peripheral neuropathy is a symptom, not a disease. Finding the cause of peripheral neuropathy can be hard. To figure that out, your health care provider will take a medical history and do a physical exam. A neurological exam will also be done. This involves checking things affected by your brain, spinal cord, and nerves (nervous system). For example, your health care provider will check your reflexes, how you move, and what you can feel. Other types of tests may also be ordered, such as:  Blood tests.  A test of the fluid in your spinal cord.  Imaging tests, such as CT scans or an MRI.  Electromyography (EMG). This test checks the nerves that control muscles.  Nerve conduction velocity tests. These tests check how fast messages pass through your nerves.  Nerve biopsy. A small piece of nerve is removed. It is then checked under a microscope. How is this treated?  Medicine is often used to treat peripheral neuropathy. Medicines may include:  Pain-relieving medicines. Prescription  or over-the-counter medicine may be suggested.  Antiseizure medicine. This may be used for pain.  Antidepressants. These also may help ease pain from neuropathy.  Lidocaine. This is a numbing medicine. You might wear a patch or be given a shot.  Mexiletine. This medicine is typically used to help control irregular heart rhythms.  Surgery. Surgery may be needed to relieve pressure on a nerve or to destroy a nerve that is causing pain.  Physical therapy to help movement.  Assistive devices to help movement. Follow these instructions at home:  Only take over-the-counter or prescription medicines as directed by your health care provider. Follow the instructions carefully for any given medicines. Do not take any other medicines without first getting approval from your health care provider.  If you have diabetes, work closely with your health care provider to keep your blood sugar under control.  If you have numbness in your feet:  Check every day for signs of injury or infection. Watch for redness, warmth, and swelling.  Wear padded socks and comfortable shoes. These help protect your feet.  Do not do things that put pressure on your damaged nerve.  Do not smoke. Smoking keeps blood from getting to damaged nerves.  Avoid or limit alcohol. Too much alcohol can cause a lack of B vitamins. These vitamins are needed for healthy nerves.  Develop a good support system. Coping with peripheral neuropathy can be stressful. Talk to a mental health specialist or join a support group if you are struggling.  Follow up with your health care provider as directed. Contact a health care provider if:  You have new signs or symptoms of peripheral neuropathy.  You are struggling emotionally from dealing with peripheral neuropathy.  You have a fever. Get help right away if:  You have an injury or infection that is not healing.  You feel very dizzy or begin vomiting.  You have chest  pain.  You have trouble breathing. This information is not intended to replace advice given to you by your health care provider. Make sure you discuss any questions you have with your health care provider. Document Released: 06/08/2002 Document Revised: 11/24/2015 Document Reviewed: 02/23/2013 Elsevier Interactive Patient Education  2017 [employer].

## 2016-10-22 ENCOUNTER — Ambulatory Visit (INDEPENDENT_AMBULATORY_CARE_PROVIDER_SITE_OTHER): Payer: Medicare Other | Admitting: Orthopedic Surgery

## 2016-10-22 ENCOUNTER — Encounter (INDEPENDENT_AMBULATORY_CARE_PROVIDER_SITE_OTHER): Payer: Self-pay | Admitting: Orthopedic Surgery

## 2016-10-22 VITALS — Ht 65.0 in | Wt 157.0 lb

## 2016-10-22 DIAGNOSIS — L02611 Cutaneous abscess of right foot: Secondary | ICD-10-CM

## 2016-10-22 DIAGNOSIS — E11621 Type 2 diabetes mellitus with foot ulcer: Secondary | ICD-10-CM | POA: Diagnosis not present

## 2016-10-22 DIAGNOSIS — L97509 Non-pressure chronic ulcer of other part of unspecified foot with unspecified severity: Secondary | ICD-10-CM | POA: Diagnosis not present

## 2016-10-22 DIAGNOSIS — I2510 Atherosclerotic heart disease of native coronary artery without angina pectoris: Secondary | ICD-10-CM

## 2016-10-22 MED ORDER — DOXYCYCLINE HYCLATE 100 MG PO TABS: 100.0000 mg | ORAL_TABLET | Freq: Two times a day (BID) | ORAL | 0 refills | Status: DC

## 2016-10-22 NOTE — Progress Notes (Signed)
Office Visit Note   Patient: William [name]           Date of Birth: 10-Nov-1950           MRN: [id number] Visit Date: 10/22/2016              Requested by: Dorena Dew, FNP [address] [place], [place] [address] PCP: Dorena Dew, FNP  Chief Complaint  Patient presents with  . Left Great Toe - Follow-up      HPI: The patient is a 66 year old gentleman who presents today for evaluation of left great toe ulcer. Is applying Silvadene once daily. Wearing socks no dressings. He states he got new diabetic shoes recently. Has a new area on the head of the fifth metatarsal this is dark and discolored and tender. There is no open area and no drainage. Overall feels he is improving  Assessment & Plan: Visit Diagnoses:  No diagnosis found.  Plan: Continue with daily Silvadene dressing changes. Keep the dressing on around the clock except for when bathing. Will follow-up in office in 3-4 weeks. Continue nitroglycerin patches.  Follow-Up Instructions: No Follow-up on file.   Ortho Exam  Patient is alert, oriented, no adenopathy, well-dressed, normal affect, normal respiratory effort. Left great toe there is distal ulceration this is filled in with eschar there is surrounding callus. The callus and nonviable tissue were hard with a 10 blade knife back to viable tissue there is bleeding. Have applied a Iodosorb dressing. There is no palpable abscess no drainage no erythema. Unable to palpate DP or PT pulse. Over the fifth metatarsal head this was debrided of callus back to underlying abscess. There is bleeding granulation tissue in the wound bed. The ulcer is times size. There is no surrounding erythema no odor no cellulitis.  Imaging: No results found.  Labs: Lab Results  Component Value Date   HGBA1C 9.3 09/14/2016   HGBA1C 9.3 08/07/2016   HGBA1C 9.2 07/06/2016   REPTSTATUS 06/26/2016 FINAL 06/21/2016   GRAMSTAIN  03/14/2016    RARE WBC PRESENT, PREDOMINANTLY  PMN NO ORGANISMS SEEN    CULT NO GROWTH 5 DAYS 06/21/2016   LABORGA PSEUDOMONAS AERUGINOSA 03/14/2016    Orders:  No orders of the defined types were placed in this encounter.  No orders of the defined types were placed in this encounter.    Procedures: No procedures performed  Clinical Data: No additional findings.  ROS:  All other systems negative, except as noted in the HPI. Review of Systems  Constitutional: Negative for chills and fever.  Skin: Positive for wound. Negative for color change.    Objective: Vital Signs: Ht 5\' 5"  (1.651 m)   Wt 157 lb (71.2 kg)   BMI 26.13 kg/m   Specialty Comments:  No specialty comments available.  PMFS History: Patient Active Problem List   Diagnosis Date Noted  . Diabetic ulcer of left great toe ([facility]) 09/17/2016  . Peripheral vascular disorder ([facility]) 09/17/2016  . S/P transmetatarsal amputation of foot, right ([place]) 07/06/2016  . Abnormality of gait 07/06/2016  . CKD (chronic kidney disease) stage 3, GFR 30-59 ml/min   . Osteomyelitis ([facility]) 06/21/2016  . Sepsis, unspecified organism ([place]) 06/21/2016  . AKI (acute kidney injury) ([facility]) 06/21/2016  . Anemia 06/21/2016  . Hyponatremia 06/21/2016  . Atherosclerosis of native artery of right lower extremity with gangrene ([facility]) 06/05/2016  . Chronic systolic HF (heart failure) ([place]) 05/28/2016  . Hypoalbuminemia 03/21/2016  . Coronary artery disease due  to lipid rich plaque 03/16/2016  . VSD (ventricular septal defect) 03/16/2016  . Hx of CABG   . Paroxysmal atrial fibrillation ([facility]) 03/15/2016  . Erectile dysfunction associated with type 2 diabetes mellitus ([place] [facility]) 04/07/2012  . Mood disorder ([place]) 03/12/2011  . Essential hypertension 03/17/2010  . ONYCHOMYCOSIS, TOENAILS 01/03/2010  . NICOTINE ADDICTION 12/28/2008  . Hyperlipidemia 11/01/2008  . GERD 11/01/2008  . SHOULDER PAIN, LEFT, CHRONIC 11/01/2008  . NECK PAIN, CHRONIC 11/01/2008  . Type 2 diabetes mellitus with  diabetic peripheral angiopathy and gangrene, with long-term current use of insulin ([facility]) 09/23/2008   Past Medical History:  Diagnosis Date  . AKI (acute kidney injury) ([facility])    With STEMI in 2017  . Chronic systolic CHF (congestive heart failure) ([facility])   . Diabetes mellitus without complication ([facility])   . Hyperlipidemia   . Hypertension   . Paroxysmal atrial fibrillation ([name])   . STEMI (ST elevation myocardial infarction) ([facility]) 2017    Family History  Problem Relation Age of Onset  . Diabetes Maternal Grandmother   . Diabetes Mother   . Aneurysm Mother   . Peripheral Artery Disease Mother   . Coronary artery disease Mother   . Peptic Ulcer Father     Past Surgical History:  Procedure Laterality Date  . ABDOMINAL AORTOGRAM W/LOWER EXTREMITY N/A 09/19/2016   Procedure: Abdominal Aortogram w/Lower Extremity;  Surgeon: Wellington Hampshire, MD;  Location: [facility];  Service: Cardiovascular;  Laterality: N/A;  . AMPUTATION Right 03/23/2016   Procedure: 1st and 2nd Ray Amputation Right Foot;  Surgeon: Newt Minion, MD;  Location: [facility];  Service: Orthopedics;  Laterality: Right;  . AMPUTATION Right 06/21/2016   Procedure: RIGHT TRANSMETATARSAL AMPUTATION;  Surgeon: Newt Minion, MD;  Location: [facility];  Service: Orthopedics;  Laterality: Right;  . CARDIAC CATHETERIZATION N/A 03/13/2016   Procedure: Right/Left Heart Cath and Coronary Angiography;  Surgeon: Sherren Mocha, MD;  Location: [facility];  Service: Cardiovascular;  Laterality: N/A;  . CARDIAC CATHETERIZATION N/A 03/13/2016   Procedure: IABP Insertion;  Surgeon: Sherren Mocha, MD;  Location: [facility];  Service: Cardiovascular;  Laterality: N/A;  . CORONARY ARTERY BYPASS GRAFT N/A 03/13/2016   Procedure: CORONARY ARTERY BYPASS GRAFTING (CABG) x 1 (SVG to OM) with EVH from [place];  Surgeon: Ivin Poot, MD;  Location: [facility];  Service: Open Heart Surgery;  Laterality: N/A;  . LOWER  EXTREMITY ANGIOGRAM  05/02/2016   Procedure: Lower Extremity Angiogram;  Surgeon: Wellington Hampshire, MD;  Location: [facility];  Service: Cardiovascular;;  Limited left femoral runoff right femoral runoff  . PERIPHERAL VASCULAR CATHETERIZATION N/A 05/02/2016   Procedure: Abdominal Aortogram;  Surgeon: Wellington Hampshire, MD;  Location: [facility];  Service: Cardiovascular;  Laterality: N/A;  . PERIPHERAL VASCULAR CATHETERIZATION Right 05/02/2016   Procedure: Peripheral Vascular Balloon Angioplasty;  Surgeon: Wellington Hampshire, MD;  Location: [facility];  Service: Cardiovascular;  Laterality: Right;  SFA  . TEE WITHOUT CARDIOVERSION N/A 03/13/2016   Procedure: TRANSESOPHAGEAL ECHOCARDIOGRAM (TEE);  Surgeon: Ivin Poot, MD;  Location: [facility];  Service: Open Heart Surgery;  Laterality: N/A;  . VSD REPAIR N/A 03/13/2016   Procedure: VENTRICULAR SEPTAL DEFECT (VSD) REPAIR;  Surgeon: Ivin Poot, MD;  Location: [facility];  Service: Open Heart Surgery;  Laterality: N/A;   Social History   Occupational History  . Not on file.   Social History Main  Topics  . Smoking status: Former Smoker    Packs/day: 0.50    Types: Cigarettes    Quit date: 11/20/2015  . Smokeless tobacco: Never Used  . Alcohol use No  . Drug use: No  . Sexual activity: Not on file

## 2016-10-23 ENCOUNTER — Telehealth (INDEPENDENT_AMBULATORY_CARE_PROVIDER_SITE_OTHER): Payer: Self-pay

## 2016-10-23 ENCOUNTER — Ambulatory Visit (INDEPENDENT_AMBULATORY_CARE_PROVIDER_SITE_OTHER): Payer: Medicare Other | Admitting: Pharmacist Clinician (PhC)/ Clinical Pharmacy Specialist

## 2016-10-23 ENCOUNTER — Other Ambulatory Visit (HOSPITAL_COMMUNITY): Payer: Self-pay | Admitting: Cardiology

## 2016-10-23 ENCOUNTER — Ambulatory Visit (HOSPITAL_COMMUNITY)
Admission: RE | Admit: 2016-10-23 | Discharge: 2016-10-23 | Disposition: A | Discharge status: Home / Self Care | Payer: Medicare Other | Source: Ambulatory Visit | Attending: Cardiology | Admitting: Cardiology

## 2016-10-23 ENCOUNTER — Other Ambulatory Visit (INDEPENDENT_AMBULATORY_CARE_PROVIDER_SITE_OTHER): Payer: Self-pay

## 2016-10-23 DIAGNOSIS — I480 Paroxysmal atrial fibrillation: Secondary | ICD-10-CM

## 2016-10-23 DIAGNOSIS — I5022 Chronic systolic (congestive) heart failure: Secondary | ICD-10-CM | POA: Insufficient documentation

## 2016-10-23 DIAGNOSIS — Z5181 Encounter for therapeutic drug level monitoring: Secondary | ICD-10-CM | POA: Diagnosis not present

## 2016-10-23 DIAGNOSIS — I2510 Atherosclerotic heart disease of native coronary artery without angina pectoris: Secondary | ICD-10-CM

## 2016-10-23 LAB — BASIC METABOLIC PANEL
Anion gap: 6 (ref 5–15)
BUN: 50 mg/dL — AB (ref 6–20)
CALCIUM: 9.7 mg/dL (ref 8.9–10.3)
CHLORIDE: 108 mmol/L (ref 101–111)
CO2: 25 mmol/L (ref 22–32)
CREATININE: 1.63 mg/dL — AB (ref 0.61–1.24)
GFR calc non Af Amer: 43 mL/min — ABNORMAL LOW (ref >60)
GFR, EST AFRICAN AMERICAN: 49 mL/min — AB (ref >60)
Glucose, Bld: 137 mg/dL — ABNORMAL HIGH (ref 65–99)
Potassium: 5.2 mmol/L — ABNORMAL HIGH (ref 3.5–5.1)
SODIUM: 139 mmol/L (ref 135–145)

## 2016-10-23 LAB — POCT INR: INR: 1.7

## 2016-10-23 MED ORDER — DOXYCYCLINE HYCLATE 100 MG PO TABS: 100.0000 mg | ORAL_TABLET | Freq: Two times a day (BID) | ORAL | 0 refills | Status: DC

## 2016-10-23 NOTE — Telephone Encounter (Signed)
Patient daughter called and stated that Rx was not at pharmacy.  Rx for Doxycyline was sent to wrong pharmacy. Correct pharmacy is [facility] on [address].

## 2016-10-23 NOTE — Telephone Encounter (Signed)
refaxed to correct pharmacy

## 2016-10-26 ENCOUNTER — Ambulatory Visit (INDEPENDENT_AMBULATORY_CARE_PROVIDER_SITE_OTHER): Payer: Medicare Other | Admitting: Family Medicine

## 2016-10-26 ENCOUNTER — Ambulatory Visit (HOSPITAL_COMMUNITY)
Admission: RE | Admit: 2016-10-26 | Discharge: 2016-10-26 | Disposition: A | Discharge status: Home / Self Care | Payer: Medicare Other | Source: Ambulatory Visit | Attending: Family Medicine | Admitting: Family Medicine

## 2016-10-26 VITALS — BP 84/58 | HR 91 | Temp 97.6°F | Resp 16 | Ht 65.0 in | Wt 156.0 lb

## 2016-10-26 DIAGNOSIS — M5136 Other intervertebral disc degeneration, lumbar region: Secondary | ICD-10-CM | POA: Insufficient documentation

## 2016-10-26 DIAGNOSIS — M545 Low back pain: Secondary | ICD-10-CM | POA: Diagnosis not present

## 2016-10-26 DIAGNOSIS — S3992XA Unspecified injury of lower back, initial encounter: Secondary | ICD-10-CM | POA: Diagnosis not present

## 2016-10-26 DIAGNOSIS — M5441 Lumbago with sciatica, right side: Secondary | ICD-10-CM

## 2016-10-26 DIAGNOSIS — I2510 Atherosclerotic heart disease of native coronary artery without angina pectoris: Secondary | ICD-10-CM | POA: Diagnosis not present

## 2016-10-26 MED ORDER — LUMBAR BACK BRACE/SUPPORT PAD MISC: 1.0000 | Freq: Every day | 0 refills | Status: DC

## 2016-10-26 MED ORDER — TRAMADOL HCL 50 MG PO TABS: 50.0000 mg | ORAL_TABLET | Freq: Four times a day (QID) | ORAL | 0 refills | Status: DC | PRN

## 2016-10-26 NOTE — Patient Instructions (Addendum)
Do not lift any objects heavier than 10 pounds.  Apply warm, moist compresses to lower back interchangeably with cold compresses.  Continue Tylenol as directed. Tramadol 50 mg every 6 hours as needed for moderate to severe pain.   Acute Pain, Adult Acute pain is a type of pain that may last for just a few days or as long as six months. It is often related to an illness, injury, or medical procedure. Acute pain may be mild, moderate, or severe. It usually goes away once your injury has healed or you are no longer ill. Pain can make it hard for you to do daily activities. It can cause anxiety and lead to other problems if left untreated. Treatment depends on the cause and severity of your acute pain. Follow these instructions at home:  Check your pain level as told by your health care provider.  Take over-the-counter and prescription medicines only as told by your health care provider.  If you are taking prescription pain medicine:  Ask your health care provider about taking a stool softener or laxative to prevent constipation.  Do not stop taking the medicine suddenly. Talk to your health care provider about how and when to discontinue prescription pain medicine.  If your pain is severe, do not take more pills than instructed by your health care provider.  Do not take other over-the-counter pain medicines in addition to this medicine unless told by your health care provider.  Do not drive or operate heavy machinery while taking prescription pain medicine.  Apply ice or heat as told by your health care provider. These may reduce swelling and pain.  Ask your health care provider if other strategies such as distraction, relaxation, or physical therapies can help your pain.  Keep all follow-up visits as told by your health care provider. This is important. Contact a health care provider if:  You have pain that is not controlled by medicine.  Your pain does not improve or gets  worse.  You have side effects from pain medicines, such as vomitingor confusion. Get help right away if:  You have severe pain.  You have trouble breathing.  You lose consciousness.  You have chest pain or pressure that lasts for more than a few minutes. Along with the chest pain you may:  Have pain or discomfort in one or both arms, your back, neck, jaw, or stomach.  Have shortness of breath.  Break out in a cold sweat.  Feel nauseous.  Become light-headed. These symptoms may represent a serious problem that is an emergency. Do not wait to see if the symptoms will go away. Get medical help right away. Call your local emergency services (911 in the [place] Do not drive yourself to the hospital. This information is not intended to replace advice given to you by your health care provider. Make sure you discuss any questions you have with your health care provider. Document Released: 07/03/2015 Document Revised: 11/25/2015 Document Reviewed: 07/03/2015 Elsevier Interactive Patient Education  2017 [employer].

## 2016-10-29 ENCOUNTER — Other Ambulatory Visit (HOSPITAL_COMMUNITY): Payer: Self-pay | Admitting: Internal Medicine

## 2016-10-30 ENCOUNTER — Ambulatory Visit (INDEPENDENT_AMBULATORY_CARE_PROVIDER_SITE_OTHER): Payer: Medicare Other | Admitting: Pharmacist Clinician (PhC)/ Clinical Pharmacy Specialist

## 2016-10-30 DIAGNOSIS — I2510 Atherosclerotic heart disease of native coronary artery without angina pectoris: Secondary | ICD-10-CM

## 2016-10-30 DIAGNOSIS — Z5181 Encounter for therapeutic drug level monitoring: Secondary | ICD-10-CM

## 2016-10-30 DIAGNOSIS — I480 Paroxysmal atrial fibrillation: Secondary | ICD-10-CM | POA: Diagnosis not present

## 2016-10-30 LAB — POCT INR: INR: 2.1

## 2016-10-31 ENCOUNTER — Encounter: Payer: Self-pay | Admitting: Family Medicine

## 2016-10-31 DIAGNOSIS — M5441 Lumbago with sciatica, right side: Secondary | ICD-10-CM | POA: Insufficient documentation

## 2016-10-31 DIAGNOSIS — S3992XA Unspecified injury of lower back, initial encounter: Secondary | ICD-10-CM | POA: Insufficient documentation

## 2016-10-31 NOTE — Progress Notes (Signed)
Mr. Arlington Sigmund, a 66 year old male with a history of uncontrolled type 2 diabetes mellitus, PAD, diabetic foot ulcer, and neuropathy presents accompanied by daughter [name] complaining of back pain. Mr. Seaman says that he reached down to pick up a container and felt a pop in his back 2 days ago. He says that back pain has been increased since that time. He rates pain intensity as 7/10.    Back Pain  This is a new problem. The current episode started in the past 7 days. The problem occurs intermittently. The problem has been gradually improving since onset. The pain is present in the lumbar spine. The pain is at a severity of 7/10. The pain is moderate. The pain is the same all the time. The symptoms are aggravated by bending, lying down, standing and twisting. Stiffness is present in the morning. Associated symptoms include tingling. Pertinent negatives include no abdominal pain, bladder incontinence, bowel incontinence, chest pain, dysuria, headaches, leg pain, numbness, paresis, pelvic pain, perianal numbness or weakness.   Past Medical History:  Diagnosis Date  . AKI (acute kidney injury) ([place])    With STEMI in 2017  . Chronic systolic CHF (congestive heart failure) ([place])   . Diabetes mellitus without complication ([place])   . Hyperlipidemia   . Hypertension   . Paroxysmal atrial fibrillation ([name])   . STEMI (ST elevation myocardial infarction) ([facility]) 2017   Social History   Social History  . Marital status: Married    Spouse name: Lavell Luster  . Number of children: N/A  . Years of education: N/A   Occupational History  . Not on file.   Social History Main Topics  . Smoking status: Former Smoker    Packs/day: 0.50    Types: Cigarettes    Quit date: 11/20/2015  . Smokeless tobacco: Never Used  . Alcohol use No  . Drug use: No  . Sexual activity: Not on file   Other Topics Concern  . Not on file   Social History Narrative  . No narrative on file   Immunization History   Administered Date(s) Administered  . Influenza,inj,Quad PF,36+ Mos 03/23/2016  . Pneumococcal Conjugate-13 09/14/2016  . Tdap 09/14/2016   Allergies  Allergen Reactions  . Morphine And Related Shortness Of Breath and Other (See Comments)    UNSPECIFIED REACTION "Pt said it was too much"   . Latex Rash    Review of Systems  HENT: Positive for ear discharge (say that he had bleeding from ear several days ago).   Eyes: Negative.   Respiratory: Negative.   Cardiovascular: Positive for PND. Negative for chest pain, palpitations and leg swelling.  Gastrointestinal: Negative.  Negative for abdominal pain and bowel incontinence.  Genitourinary: Negative.  Negative for bladder incontinence, dysuria and pelvic pain.  Musculoskeletal: Positive for back pain and myalgias.  Neurological: Positive for tingling. Negative for weakness, numbness and headaches.  Endo/Heme/Allergies: Negative.   Psychiatric/Behavioral: Negative.   Physical Exam  Constitutional: He is well-developed, well-nourished, and in no distress.  HENT:  Head: Normocephalic and atraumatic.  Right Ear: External ear normal.  Left Ear: External ear normal.  Nose: Nose normal.  Mouth/Throat: Oropharynx is clear and moist.  Cardiovascular: Normal rate, regular rhythm, normal heart sounds and intact distal pulses.   Pulmonary/Chest: Effort normal and breath sounds normal.  Abdominal: Soft. Bowel sounds are normal.  Musculoskeletal:       Lumbar back: He exhibits decreased range of motion and pain. He exhibits no swelling, no edema  and no spasm.  Neurological: He is alert. He exhibits normal muscle tone.  Ambulates with the assistance of cain    BP (!) 84/58 (BP Location: Right Arm, Patient Position: Sitting, Cuff Size: Normal)   Pulse 91   Temp 97.6 F (36.4 C) (Oral)   Resp 16   Ht 5\' 5"  (1.651 m)   Wt 156 lb (70.8 kg)   SpO2 100%   BMI 25.96 kg/m  Plan   1. Injury of low back, initial encounter Refrain from  lifting objects greater than 20 pounds.  Apply warm, moist compresses to lower back as needed.  - Elastic Bandages & Supports (LUMBAR BACK BRACE/SUPPORT PAD) MISC; 1 each by Does not apply route daily.  Dispense: 1 each; Refill: 0  2. Acute right-sided low back pain with right-sided sciatica  - DG Lumbar Spine Complete; Future - Elastic Bandages & Supports (LUMBAR BACK BRACE/SUPPORT PAD) MISC; 1 each by Does not apply route daily.  Dispense: 1 each; Refill: 0 - traMADol (ULTRAM) 50 MG tablet; Take 1 tablet (50 mg total) by mouth every 6 (six) hours as needed for moderate pain.  Dispense: 30 tablet; Refill: 0 Reviewed [facility] Substance Reporting system prior to prescribing opiate medications. No inconsistencies noted.      RTC: as previously scheduled   Donia Pounds  MSN, FNP-C [facility] [address] [place], [place] [address] [phone]

## 2016-11-01 ENCOUNTER — Other Ambulatory Visit (HOSPITAL_COMMUNITY): Payer: Self-pay | Admitting: Internal Medicine

## 2016-11-08 DIAGNOSIS — Z743 Need for continuous supervision: Secondary | ICD-10-CM | POA: Diagnosis not present

## 2016-11-08 DIAGNOSIS — R7309 Other abnormal glucose: Secondary | ICD-10-CM | POA: Diagnosis not present

## 2016-11-13 ENCOUNTER — Ambulatory Visit (INDEPENDENT_AMBULATORY_CARE_PROVIDER_SITE_OTHER): Payer: Medicare Other | Admitting: Family Medicine

## 2016-11-13 VITALS — BP 101/58 | HR 79 | Temp 97.7°F | Resp 16 | Ht 65.0 in | Wt 154.0 lb

## 2016-11-13 DIAGNOSIS — Z794 Long term (current) use of insulin: Secondary | ICD-10-CM | POA: Diagnosis not present

## 2016-11-13 DIAGNOSIS — M545 Low back pain, unspecified: Secondary | ICD-10-CM

## 2016-11-13 DIAGNOSIS — E1152 Type 2 diabetes mellitus with diabetic peripheral angiopathy with gangrene: Secondary | ICD-10-CM | POA: Diagnosis not present

## 2016-11-13 DIAGNOSIS — I2510 Atherosclerotic heart disease of native coronary artery without angina pectoris: Secondary | ICD-10-CM

## 2016-11-13 DIAGNOSIS — T23199A Burn of first degree of multiple sites of unspecified wrist and hand, initial encounter: Secondary | ICD-10-CM | POA: Diagnosis not present

## 2016-11-13 LAB — COMPLETE METABOLIC PANEL WITH GFR
ALT: 28 U/L (ref 9–46)
AST: 25 U/L (ref 10–35)
Albumin: 4 g/dL (ref 3.6–5.1)
Alkaline Phosphatase: 122 U/L — ABNORMAL HIGH (ref 40–115)
BUN: 27 mg/dL — AB (ref 7–25)
CO2: 25 mmol/L (ref 20–31)
Calcium: 10.1 mg/dL (ref 8.6–10.3)
Chloride: 106 mmol/L (ref 98–110)
Creat: 1.68 mg/dL — ABNORMAL HIGH (ref 0.70–1.25)
GFR, EST NON AFRICAN AMERICAN: 42 mL/min — AB (ref >=60)
GFR, Est African American: 49 mL/min — ABNORMAL LOW (ref >=60)
Glucose, Bld: 124 mg/dL — ABNORMAL HIGH (ref 65–99)
POTASSIUM: 5.5 mmol/L — AB (ref 3.5–5.3)
SODIUM: 142 mmol/L (ref 135–146)
Total Bilirubin: 0.7 mg/dL (ref 0.2–1.2)
Total Protein: 6.6 g/dL (ref 6.1–8.1)

## 2016-11-13 LAB — GLUCOSE, CAPILLARY: Glucose-Capillary: 123 mg/dL — ABNORMAL HIGH (ref 65–99)

## 2016-11-13 LAB — POCT GLYCOSYLATED HEMOGLOBIN (HGB A1C): HEMOGLOBIN A1C: 7.4

## 2016-11-13 MED ORDER — INSULIN DETEMIR 100 UNIT/ML FLEXPEN: 40.0000 [IU] | PEN_INJECTOR | Freq: Every day | SUBCUTANEOUS | 11 refills | Status: DC

## 2016-11-13 NOTE — Progress Notes (Signed)
[name], a 66 year old male with a history of uncontrolled type 2 diabetes mellitus, PAD, diabetic foot ulcer, and neuropathy presents accompanied by daughter William [name] for a follow up of back pain and hypoglycemia. Mr. [name] says that he reached down to pick up a container and felt a pop in his back 1 month ago.  He says that back pain has improved. He is not having low back pain at present.   He is complaining of several episodes of hypoglycemia. Patient recently traveled to [place] and had several episodes of hypoglycemia. His lowest blood sugar was 39. He was taken to the emergency department. He has been using sliding scale Novolog and Levemir HS.    Back Pain  The current episode started in the past 7 days. The problem occurs intermittently. The problem has been gradually improving since onset. The pain is present in the lumbar spine. The patient is experiencing no pain. Pertinent negatives include no abdominal pain, bladder incontinence, bowel incontinence, chest pain, dysuria, headaches, leg pain, numbness, paresis, pelvic pain, perianal numbness, tingling or weakness.  Diabetes  He presents for his follow-up diabetic visit. He has type 2 diabetes mellitus. His disease course has been fluctuating. Hypoglycemia symptoms include hunger, mood changes, speech difficulty and sweats. Pertinent negatives for hypoglycemia include no headaches, seizures or tremors. Associated symptoms include foot paresthesias (S/P toe amputations) and polydipsia. Pertinent negatives for diabetes include no chest pain, no polyphagia, no polyuria and no weakness. Hypoglycemia complications include hospitalization (Evaluated in the emergency department) and required assistance. Pertinent negatives for hypoglycemia complications include no blackouts.   Past Medical History:  Diagnosis Date  . AKI (acute kidney injury) ([facility])    With STEMI in 2017  . Chronic systolic CHF (congestive heart failure) ([place])   .  Diabetes mellitus without complication ([place])   . Hyperlipidemia   . Hypertension   . Paroxysmal atrial fibrillation ([name])   . STEMI (ST elevation myocardial infarction) ([place]) 2017   Social History   Social History  . Marital status: Married    Spouse name: Lavell Luster  . Number of children: N/A  . Years of education: N/A   Occupational History  . Not on file.   Social History Main Topics  . Smoking status: Former Smoker    Packs/day: 0.50    Types: Cigarettes    Quit date: 11/20/2015  . Smokeless tobacco: Never Used  . Alcohol use No  . Drug use: No  . Sexual activity: Not on file   Other Topics Concern  . Not on file   Social History Narrative  . No narrative on file   Immunization History  Administered Date(s) Administered  . Influenza,inj,Quad PF,36+ Mos 03/23/2016  . Pneumococcal Conjugate-13 09/14/2016  . Tdap 09/14/2016   Allergies  Allergen Reactions  . Morphine And Related Shortness Of Breath and Other (See Comments)    UNSPECIFIED REACTION "Pt said it was too much"   . Latex Rash    Review of Systems  Eyes: Negative.   Respiratory: Negative.   Cardiovascular: Positive for PND. Negative for chest pain, palpitations and leg swelling.  Gastrointestinal: Negative.  Negative for abdominal pain and bowel incontinence.  Genitourinary: Negative.  Negative for bladder incontinence, dysuria and pelvic pain.  Musculoskeletal: Positive for back pain.  Skin:       Generalized erythema and burning  Neurological: Positive for speech difficulty. Negative for tingling, tremors, seizures, weakness, numbness and headaches.  Endo/Heme/Allergies: Positive for polydipsia. Negative for polyphagia.  Psychiatric/Behavioral:  Negative.   Physical Exam  Constitutional: He is well-developed, well-nourished, and in no distress.  HENT:  Head: Normocephalic and atraumatic.  Right Ear: External ear normal.  Left Ear: External ear normal.  Nose: Nose normal.  Mouth/Throat:  Oropharynx is clear and moist.  Cardiovascular: Normal rate, regular rhythm, normal heart sounds and intact distal pulses.   Pulmonary/Chest: Effort normal and breath sounds normal.  Abdominal: Soft. Bowel sounds are normal.  Musculoskeletal:       Lumbar back: He exhibits no swelling, no edema and no spasm.  Neurological: He is alert. He exhibits normal muscle tone.  Ambulates with the assistance of cane  Skin:  Generalized erythema and blistering.     BP (!) 101/58 (BP Location: Right Arm, Patient Position: Sitting, Cuff Size: Normal)   Pulse 79   Temp 97.7 F (36.5 C) (Oral)   Resp 16   Ht 5\' 5"  (1.651 m)   Wt 154 lb (69.9 kg)   SpO2 100%   BMI 25.63 kg/m  Plan    1. Type 2 diabetes mellitus with diabetic peripheral angiopathy and gangrene, with long-term current use of insulin ([place]) Patient has experienced several episodes of hypoglycemia. Hemoglobin a1c has decreased to 7.4 from 9.3. I will decrease Levemir to 40 units HS.  - POCT A1C - Glucose (CBG) - COMPLETE METABOLIC PANEL WITH GFR - Insulin Detemir (LEVEMIR FLEXPEN) 100 UNIT/ML Pen; Inject 40 Units into the skin daily at 10 pm.  Dispense: 15 mL; Refill: 11  2. Superficial burn of multiple sites of hand, unspecified laterality, initial encounter Mr. Saldivar was sunburned in [place]. Recommend that he continue to apply silvadene cream to hands. Apply Aloe to face and neck as needed.   3. Acute low back pain without sciatica, unspecified back pain laterality Back pain has improved. Continue medication regimen as needed.   RTC: 1 month for DMII  Donia Pounds  MSN, FNP-C [facility] [address] [place], [place] [address] [phone]

## 2016-11-13 NOTE — Patient Instructions (Addendum)
Superficial Burn:  Apply Silvadene 1% to hand daily. Apply cool compresses and Aloe vera daily   Type 2 diabetes mellitus:   Will decrease Levemir to 40 units at bedtime Continue sliding scale  Less than 150 no insulin 150-199 (2 units) 200-249 (4 units) 250-299 (6 units) 300-349 (8 units) Greater than 350 (10 units) Continue carbohydrate modified diet  Low back pain:  Continue Toradol as previously prescribed.  Patient is a high fall risk due to right toe amputations Continue to ambulate with cane.  Burn Care, Adult A burn is an injury to the skin or the tissues under the skin. There are three types of burns:  First degree. These burns may cause the skin to be red and slightly swollen.  Second degree. These burns are very painful and cause the skin to be very red. The skin may also leak fluid, look shiny, and develop blisters.  Third degree. These burns cause permanent damage. They either turn the skin white or black and make it look charred, dry, and leathery. Taking care of your burn properly can help to prevent pain and infection. It can also help the burn to heal more quickly. What are the risks? Complications from burns include:  Damage to the skin.  Reduced blood flow near the injury.  Dead tissue.  Scarring.  Problems with movement, if the burn happened near a joint or on the hands or feet. Severe burns can lead to problems that affect the whole body, such as:  Fluid loss.  Less blood circulating in the body.  Inability to maintain a normal core body temperature (thermoregulation).  Infection.  Shock.  Problems breathing. How to care for a first-degree burn Right after a burn:   Rinse or soak the burn under cool water until the pain stops. Do not put ice on your burn. This can cause more damage.  Lightly cover the burn with a sterile cloth (dressing). Burn care   Follow instructions from your health care provider about:  How to clean and take  care of the burn.  When to change and remove the dressing.  Check your burn every day for signs of infection. Check for:  More redness, swelling, or pain.  Warmth.  Pus or a bad smell. Medicine   Take over-the-counter and prescription medicines only as told by your health care provider.  If you were prescribed antibiotic medicine, take or apply it as told by your health care provider. Do not stop using the antibiotic even if your condition improves. General instructions   To prevent infection, do not put butter, oil, or other home remedies on your burn.  Do not rub your burn, even when you are cleaning it.  Protect your burn from the sun. How to care for a second-degree burn Right after a burn:   Rinse or soak the burn under cool water. Do this for several minutes. Do not put ice on your burn. This can cause more damage.  Lightly cover the burn with a sterile cloth (dressing). Burn care   Raise (elevate) the injured area above the level of your heart while sitting or lying down.  Follow instructions from your health care provider about:  How to clean and take care of the burn.  When to change and remove the dressing.  Check your burn every day for signs of infection. Check for:  More redness, swelling, or pain.  Warmth.  Pus or a bad smell. Medicine    Take over-the-counter and prescription  medicines only as told by your health care provider.  If you were prescribed antibiotic medicine, take or apply it as told by your health care provider. Do not stop using the antibiotic even if your condition improves. General instructions   To prevent infection:  Do not put butter, oil, or other home remedies on the burn.  Do not scratch or pick at the burn.  Do not break any blisters.  Do not peel skin.  Do not rub your burn, even when you are cleaning it.  Protect your burn from the sun. How to care for a third-degree burn Right after a burn:   Lightly cover  the burn with gauze.  Seek immediate medical attention. Burn care   Raise (elevate) the injured area above the level of your heart while sitting or lying down.  Drink enough fluid to keep your urine clear or pale yellow.  Rest as told by your health care provider. Do not participate in sports or other physical activities until your health care provider approves.  Follow instructions from your health care provider about:  How to clean and take care of the burn.  When to change and remove the dressing.  Check your burn every day for signs of infection. Check for:  More redness, swelling, or pain.  Warmth.  Pus or a bad smell. Medicine   Take over-the-counter and prescription medicines only as told by your health care provider.  If you were prescribed antibiotic medicine, take or apply it as told by your health care provider. Do not stop using the antibiotic even if your condition improves. General instructions   To prevent infection:  Do not put butter, oil, or other home remedies on the burn.  Do not scratch or pick at the burn.  Do not break any blisters.  Do not peel skin.  Do not rub your burn, even when you are cleaning it.  Protect your burn from the sun.  Keep all follow-up visits as told by your health care provider. This is important. Contact a health care provider if:  Your condition does not improve.  Your condition gets worse.  You have a fever.  Your burn changes in appearance or develops black or red spots.  Your burn feels warm to the touch.  Your pain is not controlled with medicine. Get help right away if:  You have redness, swelling, or pain at the site of the burn.  You have fluid, blood, or pus coming from your burn.  You have red streaks near the burn.  You have severe pain. This information is not intended to replace advice given to you by your health care provider. Make sure you discuss any questions you have with your health  care provider. Document Released: 06/18/2005 Document Revised: 01/08/2016 Document Reviewed: 12/06/2015 Elsevier Interactive Patient Education  2017 [employer].  Diabetes Mellitus and Exercise Exercising regularly is important for your overall health, especially when you have diabetes (diabetes mellitus). Exercising is not only about losing weight. It has many health benefits, such as increasing muscle strength and bone density and reducing body fat and stress. This leads to improved fitness, flexibility, and endurance, all of which result in better overall health. Exercise has additional benefits for people with diabetes, including:  Reducing appetite.  Helping to lower and control blood glucose.  Lowering blood pressure.  Helping to control amounts of fatty substances (lipids) in the blood, such as cholesterol and triglycerides.  Helping the body to respond better to  insulin (improving insulin sensitivity).  Reducing how much insulin the body needs.  Decreasing the risk for heart disease by:  Lowering cholesterol and triglyceride levels.  Increasing the levels of good cholesterol.  Lowering blood glucose levels. What is my activity plan? Your health care provider or certified diabetes educator can help you make a plan for the type and frequency of exercise (activity plan) that works for you. Make sure that you:  Do at least 150 minutes of moderate-intensity or vigorous-intensity exercise each week. This could be brisk walking, biking, or water aerobics.  Do stretching and strength exercises, such as yoga or weightlifting, at least 2 times a week.  Spread out your activity over at least 3 days of the week.  Get some form of physical activity every day.  Do not go more than 2 days in a row without some kind of physical activity.  Avoid being inactive for more than 90 minutes at a time. Take frequent breaks to walk or stretch.  Choose a type of exercise or activity that  you enjoy, and set realistic goals.  Start slowly, and gradually increase the intensity of your exercise over time. What do I need to know about managing my diabetes?  Check your blood glucose before and after exercising.  If your blood glucose is higher than 240 mg/dL (13.3 mmol/L) before you exercise, check your urine for ketones. If you have ketones in your urine, do not exercise until your blood glucose returns to normal.  Know the symptoms of low blood glucose (hypoglycemia) and how to treat it. Your risk for hypoglycemia increases during and after exercise. Common symptoms of hypoglycemia can include:  Hunger.  Anxiety.  Sweating and feeling clammy.  Confusion.  Dizziness or feeling light-headed.  Increased heart rate or palpitations.  Blurry vision.  Tingling or numbness around the mouth, lips, or tongue.  Tremors or shakes.  Irritability.  Keep a rapid-acting carbohydrate snack available before, during, and after exercise to help prevent or treat hypoglycemia.  Avoid injecting insulin into areas of the body that are going to be exercised. For example, avoid injecting insulin into:  The arms, when playing tennis.  The legs, when jogging.  Keep records of your exercise habits. Doing this can help you and your health care provider adjust your diabetes management plan as needed. Write down:  Food that you eat before and after you exercise.  Blood glucose levels before and after you exercise.  The type and amount of exercise you have done.  When your insulin is expected to peak, if you use insulin. Avoid exercising at times when your insulin is peaking.  When you start a new exercise or activity, work with your health care provider to make sure the activity is safe for you, and to adjust your insulin, medicines, or food intake as needed.  Drink plenty of water while you exercise to prevent dehydration or heat stroke. Drink enough fluid to keep your urine clear or  pale yellow. This information is not intended to replace advice given to you by your health care provider. Make sure you discuss any questions you have with your health care provider. Document Released: 09/08/2003 Document Revised: 01/06/2016 Document Reviewed: 11/28/2015 Elsevier Interactive Patient Education  2017 [employer]. Diabetes Mellitus and Food It is important for you to manage your blood sugar (glucose) level. Your blood glucose level can be greatly affected by what you eat. Eating healthier foods in the appropriate amounts throughout the day at about the  same time each day will help you control your blood glucose level. It can also help slow or prevent worsening of your diabetes mellitus. Healthy eating may even help you improve the level of your blood pressure and reach or maintain a healthy weight. General recommendations for healthful eating and cooking habits include:  Eating meals and snacks regularly. Avoid going long periods of time without eating to lose weight.  Eating a diet that consists mainly of plant-based foods, such as fruits, vegetables, nuts, legumes, and whole grains.  Using low-heat cooking methods, such as baking, instead of high-heat cooking methods, such as deep frying. Work with your dietitian to make sure you understand how to use the Nutrition Facts information on food labels. How can food affect me? Carbohydrates  Carbohydrates affect your blood glucose level more than any other type of food. Your dietitian will help you determine how many carbohydrates to eat at each meal and teach you how to count carbohydrates. Counting carbohydrates is important to keep your blood glucose at a healthy level, especially if you are using insulin or taking certain medicines for diabetes mellitus. Alcohol  Alcohol can cause sudden decreases in blood glucose (hypoglycemia), especially if you use insulin or take certain medicines for diabetes mellitus. Hypoglycemia can be a  life-threatening condition. Symptoms of hypoglycemia (sleepiness, dizziness, and disorientation) are similar to symptoms of having too much alcohol. If your health care provider has given you approval to drink alcohol, do so in moderation and use the following guidelines:  Women should not have more than one drink per day, and men should not have more than two drinks per day. One drink is equal to:  12 oz of beer.  5 oz of wine.  1 oz of hard liquor.  Do not drink on an empty stomach.  Keep yourself hydrated. Have water, diet soda, or unsweetened iced tea.  Regular soda, juice, and other mixers might contain a lot of carbohydrates and should be counted. What foods are not recommended? As you make food choices, it is important to remember that all foods are not the same. Some foods have fewer nutrients per serving than other foods, even though they might have the same number of calories or carbohydrates. It is difficult to get your body what it needs when you eat foods with fewer nutrients. Examples of foods that you should avoid that are high in calories and carbohydrates but low in nutrients include:  Trans fats (most processed foods list trans fats on the Nutrition Facts label).  Regular soda.  Juice.  Candy.  Sweets, such as cake, pie, doughnuts, and cookies.  Fried foods. What foods can I eat? Eat nutrient-rich foods, which will nourish your body and keep you healthy. The food you should eat also will depend on several factors, including:  The calories you need.  The medicines you take.  Your weight.  Your blood glucose level.  Your blood pressure level.  Your cholesterol level. You should eat a variety of foods, including:  Protein.  Lean cuts of meat.  Proteins low in saturated fats, such as fish, egg whites, and beans. Avoid processed meats.  Fruits and vegetables.  Fruits and vegetables that may help control blood glucose levels, such as apples, mangoes,  and yams.  Dairy products.  Choose fat-free or low-fat dairy products, such as milk, yogurt, and cheese.  Grains, bread, pasta, and rice.  Choose whole grain products, such as multigrain bread, whole oats, and brown rice. These foods may  help control blood pressure.  Fats.  Foods containing healthful fats, such as nuts, avocado, olive oil, canola oil, and fish. Does everyone with diabetes mellitus have the same meal plan? Because every person with diabetes mellitus is different, there is not one meal plan that works for everyone. It is very important that you meet with a dietitian who will help you create a meal plan that is just right for you. This information is not intended to replace advice given to you by your health care provider. Make sure you discuss any questions you have with your health care provider. Document Released: 03/15/2005 Document Revised: 11/24/2015 Document Reviewed: 05/15/2013 Elsevier Interactive Patient Education  2017 [employer].

## 2016-11-14 ENCOUNTER — Other Ambulatory Visit: Payer: Self-pay | Admitting: Family Medicine

## 2016-11-14 DIAGNOSIS — E875 Hyperkalemia: Secondary | ICD-10-CM

## 2016-11-14 DIAGNOSIS — N183 Chronic kidney disease, stage 3 unspecified: Secondary | ICD-10-CM

## 2016-11-14 MED ORDER — SODIUM POLYSTYRENE SULFONATE 15 GM/60ML PO SUSP: 15.0000 g | Freq: Once | ORAL | 0 refills | Status: DC

## 2016-11-14 MED ORDER — SODIUM POLYSTYRENE SULFONATE 15 GM/60ML PO SUSP
15.0000 g | Freq: Once | ORAL | 0 refills | Status: AC
Start: 2016-11-14 — End: 2016-11-14

## 2016-11-14 NOTE — Progress Notes (Signed)
Mr. Trevan Messman, a 66 year old male with a history of diabetes mellitus, hypertension, peripheral vascular disroder and atherosclerosis was evaluated in office on 11/13/2016 for a follow up of diabetes mellitus.  Mr. Hammitt creatinine (a measure of kidney function) has been consistently elevated. It is currently 1.68, which consistent with stage 3 chronic kidney disease. Will continue to keep diabetes and hypertension within tight control. Will also send a referral to nephrology for further evaluation of CKD.   Potassium is elevated, which can occur with CKD, I will give a 1 time dose of kayexalate and recheck potassium within 24 to 48 hours.   Donia Pounds  MSN, FNP-C [facility] [address] [place], [place] [id number] [phone]

## 2016-11-17 ENCOUNTER — Encounter: Payer: Self-pay | Admitting: Family Medicine

## 2016-11-17 ENCOUNTER — Other Ambulatory Visit: Payer: Self-pay | Admitting: Cardiovascular Disease

## 2016-11-19 ENCOUNTER — Encounter (INDEPENDENT_AMBULATORY_CARE_PROVIDER_SITE_OTHER): Payer: Self-pay | Admitting: Orthopedic Surgery

## 2016-11-19 ENCOUNTER — Ambulatory Visit (INDEPENDENT_AMBULATORY_CARE_PROVIDER_SITE_OTHER): Payer: Medicare Other | Admitting: Orthopedic Surgery

## 2016-11-19 VITALS — Ht 65.0 in | Wt 154.0 lb

## 2016-11-19 DIAGNOSIS — E11621 Type 2 diabetes mellitus with foot ulcer: Secondary | ICD-10-CM | POA: Diagnosis not present

## 2016-11-19 DIAGNOSIS — L97509 Non-pressure chronic ulcer of other part of unspecified foot with unspecified severity: Secondary | ICD-10-CM | POA: Diagnosis not present

## 2016-11-19 DIAGNOSIS — I2510 Atherosclerotic heart disease of native coronary artery without angina pectoris: Secondary | ICD-10-CM

## 2016-11-19 MED ORDER — SILVER SULFADIAZINE 1 % EX CREA: 1.0000 "application " | TOPICAL_CREAM | Freq: Every day | CUTANEOUS | 3 refills | Status: DC

## 2016-11-19 NOTE — Progress Notes (Signed)
Office Visit Note   Patient: William [name]           Date of Birth: 08-11-1950           MRN: [id number] Visit Date: 11/19/2016              Requested by: Dorena Dew, FNP [address], [place] [address] PCP: Dorena Dew, FNP  Chief Complaint  Patient presents with  . Right Foot - Follow-up    Right transmetatarsal amputation callus medial and laterally  . Left Foot - Follow-up    Left great toe ulceration, with lateral 5th metatarsal callus      HPI: Patient states that he is doing a lot of walking in [place] developed a ulcer on the right heel with black eschar and this had some blisters on his transmetatarsal amputation on the right. Patient states is also from increased walking he states he feels that he got some of these blisters from his diabetic shoes.  Assessment & Plan: Visit Diagnoses:  1. Ischemic ulcer diabetic foot ([name])     Plan: Recommended observation of the blister on the right heel this has been decompressed there is no fluid collection this is a very thin superficial blood blister that should continue continue healing on its own. Recommended padding around the forefoot on the transmetatarsal amputation on the right recommend moisturizing lotion for both feet continue Silvadene on the heel blood blister.  Follow-Up Instructions: Return if symptoms worsen or fail to improve.   Ortho Exam  Patient is alert, oriented, no adenopathy, well-dressed, normal affect, normal respiratory effort. Examination patient has no cellulitis no odor no drainage of either foot. Examination the superficial blood blister on the right heel has no breakdown it's intact there is no fluid collection this should resolve uneventfully. Examination of the left great toe he has fibrinous exudative tissue over the toe there is no exposed bone or tendon no cellulitis no signs of infection he'll continue with Silvadene dressing changes to the toe as  well.  Imaging: No results found.  Labs: Lab Results  Component Value Date   HGBA1C 7.4 11/13/2016   HGBA1C 9.3 09/14/2016   HGBA1C 9.3 08/07/2016   REPTSTATUS 06/26/2016 FINAL 06/21/2016   GRAMSTAIN  03/14/2016    RARE WBC PRESENT, PREDOMINANTLY PMN NO ORGANISMS SEEN    CULT NO GROWTH 5 DAYS 06/21/2016   LABORGA PSEUDOMONAS AERUGINOSA 03/14/2016    Orders:  No orders of the defined types were placed in this encounter.  Meds ordered this encounter  Medications  . silver sulfADIAZINE (SILVADENE) 1 % cream    Sig: Apply 1 application topically daily. Apply to affected area daily plus dry dressing    Dispense:  400 g    Refill:  3     Procedures: No procedures performed  Clinical Data: No additional findings.  ROS:  All other systems negative, except as noted in the HPI. Review of Systems  Objective: Vital Signs: Ht 5\' 5"  (1.651 m)   Wt 154 lb (69.9 kg)   BMI 25.63 kg/m   Specialty Comments:  No specialty comments available.  PMFS History: Patient Active Problem List   Diagnosis Date Noted  . Lower back injury 10/31/2016  . Acute right-sided low back pain with right-sided sciatica 10/31/2016  . 'light-for-dates' infant with signs of fetal malnutrition 10/26/2016  . Diabetic ulcer of left great toe ([name]) 09/17/2016  . Peripheral vascular disorder ([name]) 09/17/2016  . S/P transmetatarsal amputation  of foot, right ([name]) 07/06/2016  . Abnormality of gait 07/06/2016  . CKD (chronic kidney disease) stage 3, GFR 30-59 ml/min   . Osteomyelitis ([name]) 06/21/2016  . Sepsis, unspecified organism ([name]) 06/21/2016  . AKI (acute kidney injury) ([name]) 06/21/2016  . Anemia 06/21/2016  . Hyponatremia 06/21/2016  . Atherosclerosis of native artery of right lower extremity with gangrene ([name]) 06/05/2016  . Chronic systolic HF (heart failure) ([name]) 05/28/2016  . Hypoalbuminemia 03/21/2016  . Coronary artery disease due to lipid rich plaque 03/16/2016  . VSD (ventricular  septal defect) 03/16/2016  . Hx of CABG   . Paroxysmal atrial fibrillation ([name]) 03/15/2016  . Erectile dysfunction associated with type 2 diabetes mellitus ([name]) 04/07/2012  . Mood disorder ([name]) 03/12/2011  . Essential hypertension 03/17/2010  . ONYCHOMYCOSIS, TOENAILS 01/03/2010  . NICOTINE ADDICTION 12/28/2008  . Hyperlipidemia 11/01/2008  . GERD 11/01/2008  . SHOULDER PAIN, LEFT, CHRONIC 11/01/2008  . NECK PAIN, CHRONIC 11/01/2008  . Type 2 diabetes mellitus with diabetic peripheral angiopathy and gangrene, with long-term current use of insulin ([name]) 09/23/2008   Past Medical History:  Diagnosis Date  . AKI (acute kidney injury) ([name])    With STEMI in 2017  . Chronic systolic CHF (congestive heart failure) ([place])   . Diabetes mellitus without complication ([name])   . Hyperlipidemia   . Hypertension   . Paroxysmal atrial fibrillation ([name])   . STEMI (ST elevation myocardial infarction) ([name]) 2017    Family History  Problem Relation Age of Onset  . Diabetes Maternal Grandmother   . Diabetes Mother   . Aneurysm Mother   . Peripheral Artery Disease Mother   . Coronary artery disease Mother   . Peptic Ulcer Father     Past Surgical History:  Procedure Laterality Date  . ABDOMINAL AORTOGRAM W/LOWER EXTREMITY N/A 09/19/2016   Procedure: Abdominal Aortogram w/Lower Extremity;  Surgeon: Wellington Hampshire, MD;  Location: [facility];  Service: Cardiovascular;  Laterality: N/A;  . AMPUTATION Right 03/23/2016   Procedure: 1st and 2nd Ray Amputation Right Foot;  Surgeon: Newt Minion, MD;  Location: [facility];  Service: Orthopedics;  Laterality: Right;  . AMPUTATION Right 06/21/2016   Procedure: RIGHT TRANSMETATARSAL AMPUTATION;  Surgeon: Newt Minion, MD;  Location: [facility];  Service: Orthopedics;  Laterality: Right;  . CARDIAC CATHETERIZATION N/A 03/13/2016   Procedure: Right/Left Heart Cath and Coronary Angiography;  Surgeon: Sherren Mocha, MD;  Location: [facility];   Service: Cardiovascular;  Laterality: N/A;  . CARDIAC CATHETERIZATION N/A 03/13/2016   Procedure: IABP Insertion;  Surgeon: Sherren Mocha, MD;  Location: [facility];  Service: Cardiovascular;  Laterality: N/A;  . CORONARY ARTERY BYPASS GRAFT N/A 03/13/2016   Procedure: CORONARY ARTERY BYPASS GRAFTING (CABG) x 1 (SVG to OM) with EVH from [facility];  Surgeon: Ivin Poot, MD;  Location: [facility];  Service: Open Heart Surgery;  Laterality: N/A;  . LOWER EXTREMITY ANGIOGRAM  05/02/2016   Procedure: Lower Extremity Angiogram;  Surgeon: Wellington Hampshire, MD;  Location: [facility];  Service: Cardiovascular;;  Limited left femoral runoff right femoral runoff  . PERIPHERAL VASCULAR CATHETERIZATION N/A 05/02/2016   Procedure: Abdominal Aortogram;  Surgeon: Wellington Hampshire, MD;  Location: [facility];  Service: Cardiovascular;  Laterality: N/A;  . PERIPHERAL VASCULAR CATHETERIZATION Right 05/02/2016   Procedure: Peripheral Vascular Balloon Angioplasty;  Surgeon: Wellington Hampshire, MD;  Location: [facility];  Service: Cardiovascular;  Laterality:  Right;  SFA  . TEE WITHOUT CARDIOVERSION N/A 03/13/2016   Procedure: TRANSESOPHAGEAL ECHOCARDIOGRAM (TEE);  Surgeon: Ivin Poot, MD;  Location: [facility];  Service: Open Heart Surgery;  Laterality: N/A;  . VSD REPAIR N/A 03/13/2016   Procedure: VENTRICULAR SEPTAL DEFECT (VSD) REPAIR;  Surgeon: Ivin Poot, MD;  Location: [facility];  Service: Open Heart Surgery;  Laterality: N/A;   Social History   Occupational History  . Not on file.   Social History Main Topics  . Smoking status: Former Smoker    Packs/day: 0.50    Types: Cigarettes    Quit date: 11/20/2015  . Smokeless tobacco: Never Used  . Alcohol use No  . Drug use: No  . Sexual activity: Not on file

## 2016-11-19 NOTE — Telephone Encounter (Signed)
Refill Request.  

## 2016-11-20 ENCOUNTER — Ambulatory Visit (INDEPENDENT_AMBULATORY_CARE_PROVIDER_SITE_OTHER): Payer: Medicare Other | Admitting: Pharmacist Clinician (PhC)/ Clinical Pharmacy Specialist

## 2016-11-20 DIAGNOSIS — I2510 Atherosclerotic heart disease of native coronary artery without angina pectoris: Secondary | ICD-10-CM

## 2016-11-20 DIAGNOSIS — Z5181 Encounter for therapeutic drug level monitoring: Secondary | ICD-10-CM | POA: Diagnosis not present

## 2016-11-20 DIAGNOSIS — I480 Paroxysmal atrial fibrillation: Secondary | ICD-10-CM

## 2016-11-20 LAB — POCT INR: INR: 2.2

## 2016-11-26 ENCOUNTER — Other Ambulatory Visit (INDEPENDENT_AMBULATORY_CARE_PROVIDER_SITE_OTHER): Payer: Self-pay | Admitting: Orthopedic Surgery

## 2016-12-11 ENCOUNTER — Encounter: Payer: Self-pay | Admitting: Cardiovascular Disease

## 2016-12-11 ENCOUNTER — Ambulatory Visit (INDEPENDENT_AMBULATORY_CARE_PROVIDER_SITE_OTHER): Payer: Medicare Other | Admitting: Pharmacist Clinician (PhC)/ Clinical Pharmacy Specialist

## 2016-12-11 ENCOUNTER — Ambulatory Visit (HOSPITAL_COMMUNITY)
Admission: RE | Admit: 2016-12-11 | Discharge: 2016-12-11 | Disposition: A | Discharge status: Home / Self Care | Payer: Medicare Other | Source: Ambulatory Visit | Attending: Internal Medicine | Admitting: Internal Medicine

## 2016-12-11 ENCOUNTER — Ambulatory Visit (INDEPENDENT_AMBULATORY_CARE_PROVIDER_SITE_OTHER): Payer: Medicare Other | Admitting: Cardiovascular Disease

## 2016-12-11 VITALS — BP 101/53 | HR 80 | Wt 154.0 lb

## 2016-12-11 VITALS — BP 100/56 | HR 82 | Ht 65.0 in | Wt 155.0 lb

## 2016-12-11 DIAGNOSIS — Z951 Presence of aortocoronary bypass graft: Secondary | ICD-10-CM | POA: Diagnosis not present

## 2016-12-11 DIAGNOSIS — I5022 Chronic systolic (congestive) heart failure: Secondary | ICD-10-CM

## 2016-12-11 DIAGNOSIS — I2510 Atherosclerotic heart disease of native coronary artery without angina pectoris: Secondary | ICD-10-CM | POA: Diagnosis not present

## 2016-12-11 DIAGNOSIS — E875 Hyperkalemia: Secondary | ICD-10-CM | POA: Diagnosis not present

## 2016-12-11 DIAGNOSIS — E785 Hyperlipidemia, unspecified: Secondary | ICD-10-CM | POA: Insufficient documentation

## 2016-12-11 DIAGNOSIS — Z87891 Personal history of nicotine dependence: Secondary | ICD-10-CM | POA: Diagnosis not present

## 2016-12-11 DIAGNOSIS — Z7982 Long term (current) use of aspirin: Secondary | ICD-10-CM | POA: Diagnosis not present

## 2016-12-11 DIAGNOSIS — I739 Peripheral vascular disease, unspecified: Secondary | ICD-10-CM

## 2016-12-11 DIAGNOSIS — I110 Hypertensive heart disease with heart failure: Secondary | ICD-10-CM | POA: Diagnosis not present

## 2016-12-11 DIAGNOSIS — E119 Type 2 diabetes mellitus without complications: Secondary | ICD-10-CM | POA: Diagnosis not present

## 2016-12-11 DIAGNOSIS — I480 Paroxysmal atrial fibrillation: Secondary | ICD-10-CM | POA: Insufficient documentation

## 2016-12-11 DIAGNOSIS — Z7901 Long term (current) use of anticoagulants: Secondary | ICD-10-CM | POA: Diagnosis not present

## 2016-12-11 DIAGNOSIS — Z5181 Encounter for therapeutic drug level monitoring: Secondary | ICD-10-CM | POA: Diagnosis not present

## 2016-12-11 DIAGNOSIS — I951 Orthostatic hypotension: Secondary | ICD-10-CM | POA: Insufficient documentation

## 2016-12-11 DIAGNOSIS — I252 Old myocardial infarction: Secondary | ICD-10-CM | POA: Diagnosis not present

## 2016-12-11 DIAGNOSIS — Z89411 Acquired absence of right great toe: Secondary | ICD-10-CM | POA: Insufficient documentation

## 2016-12-11 DIAGNOSIS — Z794 Long term (current) use of insulin: Secondary | ICD-10-CM | POA: Diagnosis not present

## 2016-12-11 LAB — POCT INR: INR: 1.6

## 2016-12-11 NOTE — Patient Instructions (Addendum)
Medication Instructions:  Your physician recommends that you continue on your current medications as directed. Please refer to the Current Medication list given to you today.  Follow-Up: Your physician wants you to follow-up in: 4 MONTHS with Dr. Fletcher Anon. You will receive a reminder letter in the mail two months in advance. If you don't receive a letter, please call our office to schedule the follow-up appointment. .    Any Other Special Instructions Will Be Listed Below (If Applicable).     If you need a refill on your cardiac medications before your next appointment, please call your pharmacy.

## 2016-12-11 NOTE — Progress Notes (Signed)
Advanced Heart Failure Clinic Note   PCP: Dr Pecolia Ades Primary HF Cardiologist: Dr Haroldine Laws Orthopedic: [name] Cardiac Surgeon: Dr Darcey Nora  HPI: Mr Keadle is a 66 year old with a history of CAD with inferior MI complicated by acute VSD and cardiogenic shock, S/P CABG x1 with VSD repair on 03/13/16, S/P R 1st and 2nd toe amputation 03/24/2016 followed by R transmetatarsal amputation 12/17, DMII, PAF, and hyperlipidemia.    Admitted September 2017 with chest pain. Inferior MI c/b acute VSD and cardiogenic shock required CABG x 1 with VSD repair on 03/13/2016. Post op course prolonged due to AF and cardiogenic shock due to RV failure  Slow wean off milrinone due low mixed venous saturation. Also had amputation of R 1st and 2nd toe for osteo.  Discharge weight was 150 pounds. He was not discharged on bb or ace with hypotension.   Admitted in 12/17 with sepsis due to R foot gangrene. Underwent R transmetatarsal amputation  He presents today for HF follow up. He is feeling well, denies SOB with walking into clinic, no SOB with ADL's, can walk around the grocery store without SOB. He does not weigh himself at home, weight is stable from last visit. He is taking his medications, his daughter helps him with this. There was some confusion about how he is taking losartan, but it sounds like he is taking 12.12m daily. Follows a low salt diet, drinking more than 2L a day.    ECHO 08/2016  EF 40-45% Inferior AK. VSD patch looks good. Mild MR. RV ok  ECHO 03/19/2016 EF 40-45%. RV normal  Labs 04/02/2016: K 4.2 Creatinine 1.41   Peripheral angioplasty and abdominal aortogram 05/02/16 with No significant aortoiliac disease, Significant right distal SFA stenosis, One-vessel runoff below the knee on the right via the peroneal artery which gives collaterals distally to reconstitutes part of the procedure tibial and dorsalis pedis,  Successful angioplasty followed by drug-coated balloon angioplasty of the right distal  SFA.  ROS: All systems negative except as listed in HPI, PMH and Problem List.  SH:  Social History   Social History  . Marital status: Married    Spouse name: MLavell Luster . Number of children: N/A  . Years of education: N/A   Occupational History  . Not on file.   Social History Main Topics  . Smoking status: Former Smoker    Packs/day: 0.50    Types: Cigarettes    Quit date: 11/20/2015  . Smokeless tobacco: Never Used  . Alcohol use No  . Drug use: No  . Sexual activity: Not on file   Other Topics Concern  . Not on file   Social History Narrative  . No narrative on file    FH:  Family History  Problem Relation Age of Onset  . Diabetes Maternal Grandmother   . Diabetes Mother   . Aneurysm Mother   . Peripheral Artery Disease Mother   . Coronary artery disease Mother   . Peptic Ulcer Father     Past Medical History:  Diagnosis Date  . [place] ([place]) ([facility]    With STEMI in 2017  . Chronic systolic CHF (congestive heart failure) ([facility] [place]   . Diabetes mellitus without complication ([facility]   . Hyperlipidemia   . Hypertension   . Paroxysmal atrial fibrillation ([name])   . STEMI (ST elevation myocardial infarction) ([facility] 2017    Current Outpatient Prescriptions  Medication Sig Dispense Refill  . acetaminophen (TYLENOL ARTHRITIS PAIN) 650 MG CR  tablet Take 650 mg by mouth every 8 (eight) hours as needed for pain.    [name] aspirin EC 81 MG tablet Take 1 tablet (81 mg total) by mouth daily. 30 tablet 3  . Blood Glucose Monitoring Suppl (ACCU-CHEK AVIVA PLUS) w/Device KIT 1 each by Does not apply route 4 (four) times daily -  before meals and at bedtime. 1 kit 0  . carvedilol (COREG) 3.125 MG tablet Take 1 tablet (3.125 mg total) by mouth 2 (two) times daily. 180 tablet 3  . Elastic Bandages & Supports (LUMBAR BACK BRACE/SUPPORT PAD) MISC 1 each by Does not apply route daily. 1 each 0  . gabapentin (NEURONTIN) 300 MG capsule take 1 capsule by mouth three times a day if  needed for NEUROPATHY PAIN 90 capsule 3  . glucose blood (ACCU-CHEK AVIVA) test strip Check blood sugars three times per day prior to meals and bedtime 360 each 5  . insulin aspart (NOVOLOG) 100 UNIT/ML FlexPen Per sliding scale 15 mL 11  . Insulin Detemir (LEVEMIR FLEXPEN) 100 UNIT/ML Pen Inject 40 Units into the skin daily at 10 pm. 15 mL 11  . Insulin Pen Needle 31G X 8 MM MISC 1 each by Does not apply route 4 (four) times daily. 100 each 12  . Lancets (ACCU-CHEK SOFT TOUCH) lancets Use as instructed 100 each 12  . losartan (COZAAR) 25 MG tablet Take 0.5 tablets (12.5 mg total) by mouth daily. 45 tablet 3  . nitroGLYCERIN (NITRODUR - DOSED IN MG/24 HR) 0.4 mg/hr patch Place 1 patch (0.4 mg total) onto the skin daily. 30 patch 3  . pantoprazole (PROTONIX) 40 MG tablet Take 40 mg by mouth daily.    . pramipexole (MIRAPEX) 0.125 MG tablet Take 1 tablet (0.125 mg total) by mouth at bedtime. 30 tablet 3  . rosuvastatin (CRESTOR) 40 MG tablet take 1 tablet by mouth once daily 90 tablet 3  . silver sulfADIAZINE (SILVADENE) 1 % cream Apply 1 application topically daily. Apply to affected area daily plus dry dressing 400 g 3  . spironolactone (ALDACTONE) 25 MG tablet Take 25 mg by mouth daily.    [name] SSD 1 % cream Apply topically daily. 50 g 1  . torsemide (DEMADEX) 20 MG tablet Take 1 tablet (20 mg total) by mouth 3 (three) times a week. Every Mon, Wed and Fri 30 tablet 3  . traMADol (ULTRAM) 50 MG tablet Take 1 tablet (50 mg total) by mouth every 6 (six) hours as needed for moderate pain. 30 tablet 0  . warfarin (COUMADIN) 5 MG tablet TAKE 1 TO 1 AND 1/2 TABLETS DAILY AS DIRECTED BY COUMADIN CLINIC 40 tablet 3   No current facility-administered medications for this encounter.     Vitals:   12/11/16 1042  BP: (!) 101/53  Pulse: 80  SpO2: 100%  Weight: 154 lb (69.9 kg)   Wt Readings from Last 3 Encounters:  12/11/16 154 lb (69.9 kg)  12/11/16 155 lb (70.3 kg)  11/19/16 154 lb (69.9 kg)      PHYSICAL EXAM:  General: NAD, daughter present at visit.  HEENT: Normal Neck: supple. No JVP.  Carotids 2+ bilaterally; no bruits. No thyromegaly or nodule noted. Scar present on right side of neck.  Cor: PMI normal. Regular rate and rhythm No M/G/R. Sternal scar.  Lungs: Clear bilaterally, no wheeze. Normal effort.  Abdomen: soft, NT, ND, no HSM. No bruits or masses. + bowel sounds.  Extremities: no cyanosis, clubbing, rash, No peripheral edema. R  foot s/p transmet amp  Neuro: alert & orientedx3, cranial nerves grossly intact. Moves all 4 extremities w/o difficulty. Affect pleasant.   ASSESSMENT & PLAN: 1. Inferior STEMI CAD-->S/P CABG x1 VSD Repair with RV failure in 9/17  - Denies chest pain. - Continue ASA + Crestor - Continue Coreg 3.193m BID.  2. Chronic Systolic Heart Failure- EF 40-45% in 9/17 , EF 40-45% in March 2018.   - NYHA II - Euvolemic on exam, continue torsemide 3x a week.  - Stop Spironolactone, last K was 5.5.  - Continue losartan 12.5 mg daily, will not increase with soft BP.  3. PAD Ischemic R Great Toe s/p amputation 9/22 followed by R transmet amputation 12/17 - Stable. 4. Hyperlipidemia - Continue Crestor 443mdaily.  5. DM Type II - A1c in March 2018 was 9.3 6. PAF - Regular rate and rhythm. - On coumadin for anticoagulation 7. RV failure - Improved at last Echo.  8. Orthostatic hypotension --was previously on midodrine, denies dizziness. Denies syncope and presyncope.   ErArbutus LeasNP-C 11:29 AM  Patient seen and examined with ErJettie BoozeNP. We discussed all aspects of the encounter. I agree with the assessment and plan as stated above.   CAD and HF stable. EF 40%. Now off midodrine. With hyperkalemia will stop spiro. Can take extra torsemide as needed. Consider Jardiance for DM2.   BeGlori BickersMD  4:01 PM

## 2016-12-11 NOTE — Patient Instructions (Signed)
Stop Spironolactone  We will contact you in 6 months to schedule your next appointment.

## 2016-12-11 NOTE — Progress Notes (Signed)
Cardiology Office Note   Date:  12/11/2016   ID:  William [name], DOB [birth year], MRN [id number]  PCP:  William Dew, FNP  Cardiologist:   William Sacramento, MD   Chief Complaint  Patient presents with  . Follow-up      History of Present Illness: William [name] is a 66 y.o. male who Is here today for a follow-up visit regarding peripheral arterial disease. He has known history of inferior myocardial infarction complicated by VSD in September, 2017. He is status post one-vessel CABG and VSD repair. He had postoperative atrial fibrillation and has been on anticoagulation. He had amputation of the right great toe while hospitalized for gangrene. The patient has known history of diabetes.  He is known to have peripheral arterial disease. Angiography in November showed no significant aortoiliac disease, significant right distal SFA stenosis and one-vessel runoff below the knee via the peroneal artery with reconstitution of the dorsalis pedis distally. I performed successful drug-coated balloon angioplasty of the right SFA.  He was hospitalized in December for osteomyelitis of the right foot. He underwent right transmetatarsal amputation without complications. The amputation site healed completely.  He developed fungal infection on his left big toe. The nail was removed With very slow healing after.  Angiography in March showed moderate stenosis affecting the left popliteal artery and TP trunk with 1 vessel runoff below the knee via the peroneal artery which was a large dominant vessel and reconstituted the dorsalis pedis and posterior tibial at the level of the ankle. There was brisk flow to the toes. Thus, no revascularization was performed.  He has been doing well overall with no chest pain or shortness of breath. No claudication. The left big toe ulceration continues to improve and almost completely healed.  Past Medical History:  Diagnosis Date  . AKI (acute kidney injury) ([name])    With STEMI in 2017  . Chronic systolic CHF (congestive heart failure) (William [name])   . Diabetes mellitus without complication (William [name])   . Hyperlipidemia   . Hypertension   . Paroxysmal atrial fibrillation ([name])   . STEMI (ST elevation myocardial infarction) (William [name]) 2017    Past Surgical History:  Procedure Laterality Date  . ABDOMINAL AORTOGRAM W/LOWER EXTREMITY N/A 09/19/2016   Procedure: Abdominal Aortogram w/Lower Extremity;  Surgeon: William Hampshire, MD;  Location: [facility];  Service: Cardiovascular;  Laterality: N/A;  . AMPUTATION Right 03/23/2016   Procedure: 1st and 2nd Ray Amputation Right Foot;  Surgeon: William Minion, MD;  [facility]: [name];  Service: Orthopedics;  Laterality: Right;  . AMPUTATION Right 06/21/2016   Procedure: RIGHT TRANSMETATARSAL AMPUTATION;  Surgeon: William Minion, MD;  Location: [name];  Service: Orthopedics;  Laterality: Right;  . CARDIAC CATHETERIZATION N/A 03/13/2016   Procedure: Right/Left Heart Cath and Coronary Angiography;  Surgeon: William Mocha, MD;  Location: [facility];  Service: Cardiovascular;  Laterality: N/A;  . CARDIAC CATHETERIZATION N/A 03/13/2016   Procedure: IABP Insertion;  Surgeon: William Mocha, MD;  [name];  Service: Cardiovascular;  Laterality: N/A;  . CORONARY ARTERY BYPASS GRAFT N/A 03/13/2016   Procedure: CORONARY ARTERY BYPASS GRAFTING (CABG) x 1 (SVG to OM) with EVH from [place];  Surgeon: William Poot, MD;  Location: [name];  Service: Open Heart Surgery;  Laterality: N/A;  . LOWER EXTREMITY ANGIOGRAM  05/02/2016   Procedure: Lower Extremity Angiogram;  Surgeon: William Hampshire, MD;  Location: [facility];  Service: Cardiovascular;;  Limited left femoral runoff right femoral runoff  . PERIPHERAL VASCULAR CATHETERIZATION N/A 05/02/2016   Procedure: Abdominal Aortogram;  Surgeon: William Hampshire, MD;  [name];  Service: Cardiovascular;  Laterality: N/A;  .  PERIPHERAL VASCULAR CATHETERIZATION Right 05/02/2016   Procedure: Peripheral Vascular Balloon Angioplasty;  Surgeon: William Hampshire, MD;  [name];  Service: Cardiovascular;  Laterality: Right;  SFA  . TEE WITHOUT CARDIOVERSION N/A 03/13/2016   Procedure: TRANSESOPHAGEAL ECHOCARDIOGRAM (TEE);  Surgeon: William Poot, MD;  [facility]: [name];  Service: Open Heart Surgery;  Laterality: N/A;  . VSD REPAIR N/A 03/13/2016   Procedure: VENTRICULAR SEPTAL DEFECT (VSD) REPAIR;  Surgeon: William Poot, MD;  Location: [name];  Service: Open Heart Surgery;  Laterality: N/A;     Current Outpatient Prescriptions  Medication Sig Dispense Refill  . acetaminophen (TYLENOL ARTHRITIS PAIN) 650 MG CR tablet Take 650 mg by mouth every 8 (eight) hours as needed for pain.    William [name] aspirin EC 81 MG tablet Take 1 tablet (81 mg total) by mouth daily. 30 tablet 3  . Blood Glucose Monitoring Suppl (ACCU-CHEK AVIVA PLUS) w/Device KIT 1 each by Does not apply route 4 (four) times daily -  before meals and at bedtime. 1 kit 0  . carvedilol (COREG) 3.125 MG tablet Take 1 tablet (3.125 mg total) by mouth 2 (two) times daily. 180 tablet 3  . Elastic Bandages & Supports (LUMBAR BACK BRACE/SUPPORT PAD) MISC 1 each by Does not apply route daily. 1 each 0  . gabapentin (NEURONTIN) 300 MG capsule take 1 capsule by mouth three times a day if needed for NEUROPATHY PAIN 90 capsule 3  . glucose blood (ACCU-CHEK AVIVA) test strip Check blood sugars three times per day prior to meals and bedtime 360 each 5  . insulin aspart (NOVOLOG) 100 UNIT/ML FlexPen Per sliding scale 15 mL 11  . Insulin Detemir (LEVEMIR FLEXPEN) 100 UNIT/ML Pen Inject 40 Units into the skin daily at 10 pm. 15 mL 11  . Insulin Pen Needle 31G X 8 MM MISC 1 each by Does not apply route 4 (four) times daily. 100 each 12  . Lancets (ACCU-CHEK SOFT TOUCH) lancets Use as instructed 100 each 12  . losartan (COZAAR) 25 MG tablet Take 0.5 tablets (12.5 mg total)  by mouth daily. 45 tablet 3  . nitroGLYCERIN (NITRODUR - DOSED IN MG/24 HR) 0.4 mg/hr patch Place 1 patch (0.4 mg total) onto the skin daily. 30 patch 3  . pantoprazole (PROTONIX) 40 MG tablet Take 40 mg by mouth daily.    . pramipexole (MIRAPEX) 0.125 MG tablet Take 1 tablet (0.125 mg total) by mouth at bedtime. 30 tablet 3  . rosuvastatin (CRESTOR) 40 MG tablet take 1 tablet by mouth once daily 90 tablet 3  . silver sulfADIAZINE (SILVADENE) 1 % cream Apply 1 application topically daily. Apply to affected area daily plus dry dressing 400 g 3  . spironolactone (ALDACTONE) 25 MG tablet Take 25 mg by mouth daily.    William [name] SSD 1 % cream Apply topically daily. 50 g 1  . torsemide (DEMADEX) 20 MG tablet Take 1 tablet (20 mg total) by mouth 3 (three) times a week. Every Mon, Wed and Fri 30 tablet 3  . traMADol (ULTRAM) 50 MG tablet Take 1 tablet (50 mg total) by mouth every 6 (six) hours as needed for moderate pain. 30 tablet 0  . warfarin (COUMADIN) 5 MG tablet  TAKE 1 TO 1 AND 1/2 TABLETS DAILY AS DIRECTED BY COUMADIN CLINIC 40 tablet 3   No current facility-administered medications for this visit.     Allergies:   Morphine and related and Latex    Social History:  The patient  reports that he quit smoking about 12 months ago. His smoking use included Cigarettes. He smoked 0.50 packs per day. He has never used smokeless tobacco. He reports that he does not drink alcohol or use drugs.   Family History:  Not able to obtain due to distress.  ROS:  Please see the history of present illness.   Otherwise, review of systems are positive for none.   All other systems are reviewed and negative.    PHYSICAL EXAM: VS:  BP (!) 100/56 (BP Location: Right Arm, Patient Position: Sitting, Cuff Size: Normal)   Pulse 82   Ht 5\' 5"  (1.651 m)   Wt 155 lb (70.3 kg)   BMI 25.79 kg/m  , BMI Body mass index is 25.79 kg/m. GEN: Well nourished, well developed, in no acute distress  HEENT: normal  Neck: no JVD,  carotid bruits, or masses Cardiac: RRR; no  rubs, or gallops,no edema . No murmurs Respiratory:  clear to auscultation bilaterally, normal work of breathing GI: soft, nontender, nondistended, + BS MS: no deformity or atrophy  Skin: warm and dry, no rash Neuro:  Strength and sensation are intact Psych: euthymic mood, full affect Vascular: Distal pulses are not palpable.   EKG:  EKG is not ordered today.    Recent Labs: 06/22/2016: Magnesium 2.3 09/19/2016: Hemoglobin 10.5; Platelets 182 11/13/2016: ALT 28; BUN 27; Creat 1.68; Potassium 5.5; Sodium 142    Lipid Panel    Component Value Date/Time   CHOL 96 03/13/2016 1057   TRIG 146 03/13/2016 1057   HDL 23 (L) 03/13/2016 1057   CHOLHDL 4.2 03/13/2016 1057   VLDL 29 03/13/2016 1057   LDLCALC 44 03/13/2016 1057      Wt Readings from Last 3 Encounters:  12/11/16 155 lb (70.3 kg)  11/19/16 154 lb (69.9 kg)  11/13/16 154 lb (69.9 kg)      No flowsheet data found.    ASSESSMENT AND PLAN:  1.  Peripheral arterial disease :  He is status post recent drug-coated balloon angioplasty of the right SFA with good results. He is s/p right transmetatarsal amputation for osteomyelitis with good healing . The ulceration on the left big toe is Almost completely healed. He has 1 vessel runoff below the knee via the peroneal artery which gives good collaterals to the distal posterior tibial and dorsalis pedis. If the ulceration does not he is completely, I will consider posterior tibial artery angioplasty via the retrograde approach. That should be left as a last resort given that the vessel is occluded proximally with diffuse disease throughout its course.  2. Chronic systolic heart failure: He appears to be euvolemic.   He is doing very well overall and appears to be euvolemic.  Continue small dose carvedilol and losartan.  3.Postoperative atrial fibrillation: Currently in sinus rhythm.He is on anticoagulation with warfarin. We might  consider stopping anticoagulation if no recurrent atrial fibrillation.  4. Coronary artery disease involving native coronary arteries without angina:  status post one-vessel CABG and VSD repair. Continue medical therapy.  5. Diabetes mellitus: Improved from before.  6. Hyperlipidemia: Continue high-dose rosuvastatin.   Disposition:   follow-up with me in 4 months  Signed,  11/15/16, MD  12/11/2016 8:52 AM  [facility]

## 2016-12-11 NOTE — Progress Notes (Signed)
Advanced Heart Failure Medication Review by a Pharmacist  Does the patient  feel that his/her medications are working for him/her?  yes  Has the patient been experiencing any side effects to the medications prescribed?  yes  Does the patient measure his/her own blood pressure or blood glucose at home?  yes   Does the patient have any problems obtaining medications due to transportation or finances?   no  Understanding of regimen: good Understanding of indications: good Potential of compliance: good Patient understands to avoid NSAIDs. Patient understands to avoid decongestants.  Issues to address at subsequent visits: none identified   Pharmacist comments: William [name] is a pleasant 66 yo male presenting with daughter who takes care of all medications. She did not bring Rx bottles but has a good understanding of regimens. Daughter states there was confusion over losartan Rx and was initially giving a whole tab (25 mg) instead of 1/2 a tab (12.5 mg). Needs clarification. No other issues with meds. Denies s/sx hypoglycemia. Reviewed proper treatment for BG <70 with patient and daughter.     Time with patient: 15 mins Preparation and documentation time: 3 mins Total time: 18 mins

## 2016-12-12 ENCOUNTER — Ambulatory Visit: Payer: Medicare Other | Admitting: Family Medicine

## 2016-12-17 ENCOUNTER — Ambulatory Visit: Payer: Medicare Other | Admitting: Family Medicine

## 2016-12-18 ENCOUNTER — Ambulatory Visit (INDEPENDENT_AMBULATORY_CARE_PROVIDER_SITE_OTHER): Payer: Self-pay | Admitting: Family Medicine

## 2016-12-18 VITALS — BP 100/68 | HR 75 | Temp 97.5°F | Resp 16 | Ht 65.0 in | Wt 155.0 lb

## 2016-12-18 DIAGNOSIS — K219 Gastro-esophageal reflux disease without esophagitis: Secondary | ICD-10-CM

## 2016-12-18 DIAGNOSIS — R0981 Nasal congestion: Secondary | ICD-10-CM

## 2016-12-18 DIAGNOSIS — Z794 Long term (current) use of insulin: Secondary | ICD-10-CM

## 2016-12-18 DIAGNOSIS — E1152 Type 2 diabetes mellitus with diabetic peripheral angiopathy with gangrene: Secondary | ICD-10-CM

## 2016-12-18 DIAGNOSIS — I2510 Atherosclerotic heart disease of native coronary artery without angina pectoris: Secondary | ICD-10-CM

## 2016-12-18 DIAGNOSIS — R058 Other specified cough: Secondary | ICD-10-CM

## 2016-12-18 DIAGNOSIS — R05 Cough: Secondary | ICD-10-CM

## 2016-12-18 LAB — POCT GLYCOSYLATED HEMOGLOBIN (HGB A1C): Hemoglobin A1C: 7.8

## 2016-12-18 LAB — GLUCOSE, CAPILLARY: GLUCOSE-CAPILLARY: 305 mg/dL — AB (ref 65–99)

## 2016-12-18 MED ORDER — SALINE 0.9 % NA AERS: 1.0000 | INHALATION_SPRAY | Freq: Two times a day (BID) | NASAL | 0 refills | Status: DC | PRN

## 2016-12-18 MED ORDER — ALBUTEROL SULFATE HFA 108 (90 BASE) MCG/ACT IN AERS: 2.0000 | INHALATION_SPRAY | Freq: Four times a day (QID) | RESPIRATORY_TRACT | 0 refills | Status: DC | PRN

## 2016-12-18 MED ORDER — PANTOPRAZOLE SODIUM 40 MG PO TBEC: 40.0000 mg | DELAYED_RELEASE_TABLET | Freq: Every day | ORAL | 5 refills | Status: DC

## 2016-12-18 NOTE — Progress Notes (Signed)
[name], a 66 year old male with a history of uncontrolled type 2 diabetes mellitus, PAD, diabetic foot ulcer, and neuropathy presents accompanied by daughter William [name] for a follow up type 2 diabetes.    Diabetes  He presents for his follow-up diabetic visit. He has type 2 diabetes mellitus. His disease course has been fluctuating. Pertinent negatives for hypoglycemia include no headaches, hunger, mood changes, seizures, speech difficulty, sweats or tremors. Associated symptoms include foot paresthesias (S/P toe amputations). Pertinent negatives for diabetes include no chest pain, no polydipsia and no weakness. Pertinent negatives for hypoglycemia complications include no blackouts. Risk factors for coronary artery disease include dyslipidemia and hypertension. Current diabetic treatment includes insulin injections. He is compliant with treatment most of the time. He is following a diabetic diet. He has not had a previous visit with a dietitian. He never participates in exercise. An ACE inhibitor/angiotensin II receptor blocker is not being taken. He sees a podiatrist.Eye exam is current.   Past Medical History:  Diagnosis Date  . AKI (acute kidney injury) ([place])    With STEMI in 2017  . Chronic systolic CHF (congestive heart failure) ([place])   . Diabetes mellitus without complication ([place])   . Hyperlipidemia   . Hypertension   . Paroxysmal atrial fibrillation ([name])   . STEMI (ST elevation myocardial infarction) ([place]) 2017   Social History   Social History  . Marital status: Married    Spouse name: William [name]  . Number of children: N/A  . Years of education: N/A   Occupational History  . Not on file.   Social History Main Topics  . Smoking status: Former Smoker    Packs/day: 0.50    Types: Cigarettes    Quit date: 11/20/2015  . Smokeless tobacco: Never Used  . Alcohol use No  . Drug use: No  . Sexual activity: Not on file   Other Topics Concern  . Not on file   Social History  Narrative  . No narrative on file   Immunization History  Administered Date(s) Administered  . Influenza,inj,Quad PF,36+ Mos 03/23/2016  . Pneumococcal Conjugate-13 09/14/2016  . Tdap 09/14/2016   Allergies  Allergen Reactions  . Morphine And Related Shortness Of Breath and Other (See Comments)    UNSPECIFIED REACTION "Pt said it was too much"   . Latex Rash    Review of Systems  Eyes: Negative.   Respiratory: Negative.   Cardiovascular: Positive for PND. Negative for chest pain, palpitations and leg swelling.  Gastrointestinal: Negative.  Negative for abdominal pain and bowel incontinence.  Genitourinary: Negative.  Negative for bladder incontinence, dysuria and pelvic pain.  Musculoskeletal: Positive for back pain.  Skin:       Generalized erythema and burning  Neurological: Negative for tingling, tremors, seizures, speech difficulty, weakness, numbness and headaches.  Endo/Heme/Allergies: Negative for polydipsia.  Psychiatric/Behavioral: Negative.   Physical Exam  Constitutional: He is well-developed, well-nourished, and in no distress.  HENT:  Head: Normocephalic and atraumatic.  Right Ear: External ear normal.  Left Ear: External ear normal.  Nose: Nose normal.  Mouth/Throat: Oropharynx is clear and moist.  Cardiovascular: Normal rate, regular rhythm, normal heart sounds and intact distal pulses.   Pulmonary/Chest: Effort normal and breath sounds normal.  Abdominal: Soft. Bowel sounds are normal.  Musculoskeletal:       Lumbar back: He exhibits no swelling, no edema and no spasm.  Neurological: He is alert. He exhibits normal muscle tone.  Ambulates with the assistance of cane  Skin:  Generalized erythema and blistering.     BP 100/68 (BP Location: Right Arm, Patient Position: Sitting, Cuff Size: Normal) Comment: manually  Pulse 75   Temp 97.5 F (36.4 C) (Oral)   Resp 16   Ht 5\' 5"  (1.651 m)   Wt 155 lb (70.3 kg)   SpO2 99%   BMI 25.79 kg/m  Plan    1. Type 2 diabetes mellitus with diabetic peripheral angiopathy and gangrene, with long-term current use of insulin ([name]) Blood pressure elevated to 351 on arrival. Patient had a slice of cake. I will not given insulin in office today. Patient advised to hydrate and take medication consistently.  Hemoglobin a1C has decreased to 7.8, his daughter has been preparing carbohydrate modified meals. Blood sugar is at goal, I will continue medications as previously prescribed.  He says that he rarely eats sweet, but when he does blood sugars typically spike.   - HgB A1c  2. Gastroesophageal reflux disease without esophagitis Patient is requesting a refill on Protonix. GERD has been controlled on protonix.  - pantoprazole (PROTONIX) 40 MG tablet; Take 1 tablet (40 mg total) by mouth daily.  Dispense: 30 tablet; Refill: 5  3. Cough productive of clear sputum - albuterol (PROVENTIL HFA;VENTOLIN HFA) 108 (90 Base) MCG/ACT inhaler; Inhale 2 puffs into the lungs every 6 (six) hours as needed for wheezing or shortness of breath.  Dispense: 1 Inhaler; Refill: 0  4. Nasal congestion - Saline (SIMPLY SALINE) 0.9 % AERS; Place 1 each into the nose 2 (two) times daily as needed.  Dispense: 1 Can; Refill: 0   RTC: 3 months for DMII   Donia Pounds  MSN, FNP-C [address] [place], [place] [id number] [phone]

## 2016-12-18 NOTE — Patient Instructions (Addendum)
Type 2 diabetes: Hemoglobin a1C is at goal, which is <8.  Blood sugar is 351, increase fluid intake   Gastroesophageal reflux:   Will continue Protonix 40 mg daily  Persistent cough:  Recommend Albuterol inhaler 20 puffs every 6 hours as needed for shortness of breath, cough and/or wheezing.  Increase water intake Refrain from second hand smoke   Food Choices for Gastroesophageal Reflux Disease, Adult When you have gastroesophageal reflux disease (GERD), the foods you eat and your eating habits are very important. Choosing the right foods can help ease the discomfort of GERD. Consider working with a diet and nutrition specialist (dietitian) to help you make healthy food choices. What general guidelines should I follow? Eating plan  Choose healthy foods low in fat, such as fruits, vegetables, whole grains, low-fat dairy products, and lean meat, fish, and poultry.  Eat frequent, small meals instead of three large meals each day. Eat your meals slowly, in a relaxed setting. Avoid bending over or lying down until 2-3 hours after eating.  Limit high-fat foods such as fatty meats or fried foods.  Limit your intake of oils, butter, and shortening to less than 8 teaspoons each day.  Avoid the following: ? Foods that cause symptoms. These may be different for different people. Keep a food diary to keep track of foods that cause symptoms. ? Alcohol. ? Drinking large amounts of liquid with meals. ? Eating meals during the 2-3 hours before bed.  Cook foods using methods other than frying. This may include baking, grilling, or broiling. Lifestyle   Maintain a healthy weight. Ask your health care provider what weight is healthy for you. If you need to lose weight, work with your health care provider to do so safely.  Exercise for at least 30 minutes on 5 or more days each week, or as told by your health care provider.  Avoid wearing clothes that fit tightly around your waist and  chest.  Do not use any products that contain nicotine or tobacco, such as cigarettes and e-cigarettes. If you need help quitting, ask your health care provider.  Sleep with the head of your bed raised. Use a wedge under the mattress or blocks under the bed frame to raise the head of the bed. What foods are not recommended? The items listed may not be a complete list. Talk with your dietitian about what dietary choices are best for you. Grains Pastries or quick breads with added fat. [place] toast. Vegetables Deep fried vegetables. [place] fries. Any vegetables prepared with added fat. Any vegetables that cause symptoms. For some people this may include tomatoes and tomato products, chili peppers, onions and garlic, and horseradish. Fruits Any fruits prepared with added fat. Any fruits that cause symptoms. For some people this may include citrus fruits, such as oranges, grapefruit, pineapple, and lemons. Meats and other protein foods High-fat meats, such as fatty beef or pork, hot dogs, ribs, ham, sausage, salami and bacon. Fried meat or protein, including fried fish and fried chicken. Nuts and nut butters. Dairy Whole milk and chocolate milk. Sour cream. Cream. Ice cream. Cream cheese. Milk shakes. Beverages Coffee and tea, with or without caffeine. Carbonated beverages. Sodas. Energy drinks. Fruit juice made with acidic fruits (such as orange or grapefruit). Tomato juice. Alcoholic drinks. Fats and oils Butter. Margarine. Shortening. Ghee. Sweets and desserts Chocolate and cocoa. Donuts. Seasoning and other foods Pepper. Peppermint and spearmint. Any condiments, herbs, or seasonings that cause symptoms. For some people, this may include curry,  hot sauce, or vinegar-based salad dressings. Summary  When you have gastroesophageal reflux disease (GERD), food and lifestyle choices are very important to help ease the discomfort of GERD.  Eat frequent, small meals instead of three large meals  each day. Eat your meals slowly, in a relaxed setting. Avoid bending over or lying down until 2-3 hours after eating.  Limit high-fat foods such as fatty meat or fried foods. This information is not intended to replace advice given to you by your health care provider. Make sure you discuss any questions you have with your health care provider. Document Released: 06/18/2005 Document Revised: 06/19/2016 Document Reviewed: 06/19/2016 Elsevier Interactive Patient Education  2017 [employer].

## 2016-12-19 ENCOUNTER — Encounter: Payer: Self-pay | Admitting: Family Medicine

## 2016-12-20 ENCOUNTER — Ambulatory Visit: Payer: Medicare Other | Admitting: Family Medicine

## 2016-12-25 ENCOUNTER — Ambulatory Visit (INDEPENDENT_AMBULATORY_CARE_PROVIDER_SITE_OTHER): Payer: Medicare Other | Admitting: Pharmacist Clinician (PhC)/ Clinical Pharmacy Specialist

## 2016-12-25 ENCOUNTER — Other Ambulatory Visit: Payer: Self-pay | Admitting: Pharmacist Clinician (PhC)/ Clinical Pharmacy Specialist

## 2016-12-25 DIAGNOSIS — Z5181 Encounter for therapeutic drug level monitoring: Secondary | ICD-10-CM | POA: Diagnosis not present

## 2016-12-25 DIAGNOSIS — I480 Paroxysmal atrial fibrillation: Secondary | ICD-10-CM

## 2016-12-25 DIAGNOSIS — I2510 Atherosclerotic heart disease of native coronary artery without angina pectoris: Secondary | ICD-10-CM

## 2016-12-25 LAB — POCT INR: INR: 1.8

## 2016-12-25 MED ORDER — WARFARIN SODIUM 5 MG PO TABS: ORAL_TABLET | ORAL | 3 refills | Status: DC

## 2017-01-08 ENCOUNTER — Ambulatory Visit (INDEPENDENT_AMBULATORY_CARE_PROVIDER_SITE_OTHER): Payer: Medicare Other | Admitting: Pharmacist

## 2017-01-08 DIAGNOSIS — I480 Paroxysmal atrial fibrillation: Secondary | ICD-10-CM

## 2017-01-08 DIAGNOSIS — Z5181 Encounter for therapeutic drug level monitoring: Secondary | ICD-10-CM

## 2017-01-08 LAB — POCT INR: INR: 2

## 2017-01-29 ENCOUNTER — Ambulatory Visit (INDEPENDENT_AMBULATORY_CARE_PROVIDER_SITE_OTHER): Payer: Medicare Other | Admitting: Pharmacist

## 2017-01-29 DIAGNOSIS — I480 Paroxysmal atrial fibrillation: Secondary | ICD-10-CM

## 2017-01-29 DIAGNOSIS — Z5181 Encounter for therapeutic drug level monitoring: Secondary | ICD-10-CM

## 2017-01-29 LAB — POCT INR: INR: 2.2

## 2017-02-06 ENCOUNTER — Other Ambulatory Visit (HOSPITAL_COMMUNITY): Payer: Self-pay | Admitting: Student

## 2017-02-19 ENCOUNTER — Telehealth: Payer: Self-pay | Admitting: Cardiovascular Disease

## 2017-02-19 MED ORDER — WARFARIN SODIUM 5 MG PO TABS: ORAL_TABLET | ORAL | 3 refills | Status: DC

## 2017-02-19 NOTE — Telephone Encounter (Signed)
New message      *STAT* If patient is at the pharmacy, call can be transferred to refill team.   1. Which medications need to be refilled? (please list name of each medication and dose if known)  warafrin   2. Which pharmacy/location (including street and city if local pharmacy) is medication to be sent to? [facility]   3. Do they need a 30 day or 90 day supply? [place]

## 2017-02-26 ENCOUNTER — Ambulatory Visit (INDEPENDENT_AMBULATORY_CARE_PROVIDER_SITE_OTHER): Payer: Medicare Other | Admitting: Pharmacist Clinician (PhC)/ Clinical Pharmacy Specialist

## 2017-02-26 DIAGNOSIS — Z5181 Encounter for therapeutic drug level monitoring: Secondary | ICD-10-CM

## 2017-02-26 DIAGNOSIS — I480 Paroxysmal atrial fibrillation: Secondary | ICD-10-CM | POA: Diagnosis not present

## 2017-02-26 LAB — POCT INR: INR: 1.7

## 2017-02-28 ENCOUNTER — Telehealth: Payer: Self-pay

## 2017-02-28 DIAGNOSIS — E1152 Type 2 diabetes mellitus with diabetic peripheral angiopathy with gangrene: Secondary | ICD-10-CM

## 2017-02-28 DIAGNOSIS — Z794 Long term (current) use of insulin: Secondary | ICD-10-CM

## 2017-03-01 ENCOUNTER — Other Ambulatory Visit: Payer: Self-pay

## 2017-03-01 MED ORDER — INSULIN DETEMIR 100 UNIT/ML FLEXPEN: 40.0000 [IU] | PEN_INJECTOR | Freq: Every day | SUBCUTANEOUS | 11 refills | Status: DC

## 2017-03-01 MED ORDER — INSULIN PEN NEEDLE 31G X 8 MM MISC: 1.0000 | Freq: Four times a day (QID) | 12 refills | Status: DC

## 2017-03-01 NOTE — Telephone Encounter (Signed)
Refill on Levemir sent into pharmacy. Thanks!

## 2017-03-01 NOTE — Telephone Encounter (Signed)
Refill for pen needles sent into pharmacy. Thanks!

## 2017-03-05 ENCOUNTER — Ambulatory Visit (INDEPENDENT_AMBULATORY_CARE_PROVIDER_SITE_OTHER): Payer: Medicare Other | Admitting: Pharmacist

## 2017-03-05 ENCOUNTER — Encounter: Payer: Self-pay | Admitting: Cardiovascular Disease

## 2017-03-05 ENCOUNTER — Ambulatory Visit (INDEPENDENT_AMBULATORY_CARE_PROVIDER_SITE_OTHER): Payer: Medicare Other | Admitting: Cardiovascular Disease

## 2017-03-05 VITALS — BP 97/54 | HR 77 | Ht 65.0 in | Wt 171.0 lb

## 2017-03-05 DIAGNOSIS — Z720 Tobacco use: Secondary | ICD-10-CM

## 2017-03-05 DIAGNOSIS — I25810 Atherosclerosis of coronary artery bypass graft(s) without angina pectoris: Secondary | ICD-10-CM

## 2017-03-05 DIAGNOSIS — I739 Peripheral vascular disease, unspecified: Secondary | ICD-10-CM

## 2017-03-05 DIAGNOSIS — Z5181 Encounter for therapeutic drug level monitoring: Secondary | ICD-10-CM

## 2017-03-05 DIAGNOSIS — I5022 Chronic systolic (congestive) heart failure: Secondary | ICD-10-CM

## 2017-03-05 DIAGNOSIS — I480 Paroxysmal atrial fibrillation: Secondary | ICD-10-CM

## 2017-03-05 DIAGNOSIS — E785 Hyperlipidemia, unspecified: Secondary | ICD-10-CM

## 2017-03-05 LAB — POCT INR: INR: 1.9

## 2017-03-05 MED ORDER — WARFARIN SODIUM 5 MG PO TABS: ORAL_TABLET | ORAL | 1 refills | Status: DC

## 2017-03-05 MED ORDER — WARFARIN SODIUM 5 MG PO TABS: ORAL_TABLET | ORAL | 3 refills | Status: DC

## 2017-03-05 NOTE — Progress Notes (Signed)
Cardiology Office Note   Date:  03/06/2017   ID:  William [name], DOB 1950-10-13, MRN [id number]  PCP:  Dorena Dew, FNP  Cardiologist:   [name], MD   No chief complaint on file.     History of Present Illness: William [name] is a 66 y.o. male who Is here today for a follow-up visit regarding peripheral arterial disease. He has known history of inferior myocardial infarction complicated by VSD in September, 2017. He is status post one-vessel CABG and VSD repair. He had postoperative atrial fibrillation and has been on anticoagulation. He had amputation of the right great toe while hospitalized for gangrene. The patient has known history of diabetes.  He is known to have peripheral arterial disease. Angiography in November showed no significant aortoiliac disease, significant right distal SFA stenosis and one-vessel runoff below the knee via the peroneal artery with reconstitution of the dorsalis pedis distally. I performed successful drug-coated balloon angioplasty of the right SFA.  He was hospitalized in December for osteomyelitis of the right foot. He underwent right transmetatarsal amputation without complications. The amputation site healed completely.  He developed fungal infection on his left big toe. The nail was removed with very slow healing after.  Angiography in March showed moderate stenosis affecting the left popliteal artery and TP trunk with 1 vessel runoff below the knee via the peroneal artery which was a large dominant vessel and reconstituted the dorsalis pedis and posterior tibial at the level of the ankle. There was brisk flow to the toes. Thus, no revascularization was performed.  He has been doing reasonably well overall with no chest pain or palpitations. He reports stable exertional dyspnea. No leg claudication. Unfortunately, he resumed smoking. The ulceration on the left big toe is healed.  He reports no chest pain. Stable exertional dyspnea.  Past  Medical History:  Diagnosis Date  . AKI (acute kidney injury) ([place])    With STEMI in 2017  . Chronic systolic CHF (congestive heart failure) ([place])   . Diabetes mellitus without complication ([place])   . Hyperlipidemia   . Hypertension   . Paroxysmal atrial fibrillation ([name])   . STEMI (ST elevation myocardial infarction) ([place]) 2017    Past Surgical History:  Procedure Laterality Date  . ABDOMINAL AORTOGRAM W/LOWER EXTREMITY N/A 09/19/2016   Procedure: Abdominal Aortogram w/Lower Extremity;  Surgeon: Wellington Hampshire, MD;  Location: [facility];  Service: Cardiovascular;  Laterality: N/A;  . AMPUTATION Right 03/23/2016   Procedure: 1st and 2nd Ray Amputation Right Foot;  Surgeon: Newt Minion, MD;  Location: [facility];  Service: Orthopedics;  Laterality: Right;  . AMPUTATION Right 06/21/2016   Procedure: RIGHT TRANSMETATARSAL AMPUTATION;  Surgeon: Newt Minion, MD;  Location: [facility];  Service: Orthopedics;  Laterality: Right;  . CARDIAC CATHETERIZATION N/A 03/13/2016   Procedure: Right/Left Heart Cath and Coronary Angiography;  Surgeon: Sherren Mocha, MD;  Location: [facility];  Service: Cardiovascular;  Laterality: N/A;  . CARDIAC CATHETERIZATION N/A 03/13/2016   Procedure: IABP Insertion;  Surgeon: Sherren Mocha, MD;  Location: [facility];  Service: Cardiovascular;  Laterality: N/A;  . CORONARY ARTERY BYPASS GRAFT N/A 03/13/2016   Procedure: CORONARY ARTERY BYPASS GRAFTING (CABG) x 1 (SVG to OM) with EVH from [place];  Surgeon: Ivin Poot, MD;  Location: [facility];  Service: Open Heart Surgery;  Laterality: N/A;  . LOWER EXTREMITY ANGIOGRAM  05/02/2016   Procedure: Lower Extremity Angiogram;  Surgeon:  Wellington Hampshire, MD;  Location: [facility];  Service: Cardiovascular;;  Limited left femoral runoff right femoral runoff  . PERIPHERAL VASCULAR CATHETERIZATION N/A 05/02/2016   Procedure: Abdominal Aortogram;  Surgeon: Wellington Hampshire, MD;   Location: [facility];  Service: Cardiovascular;  Laterality: N/A;  . PERIPHERAL VASCULAR CATHETERIZATION Right 05/02/2016   Procedure: Peripheral Vascular Balloon Angioplasty;  Surgeon: Wellington Hampshire, MD;  Location: [facility];  Service: Cardiovascular;  Laterality: Right;  SFA  . TEE WITHOUT CARDIOVERSION N/A 03/13/2016   Procedure: TRANSESOPHAGEAL ECHOCARDIOGRAM (TEE);  Surgeon: Ivin Poot, MD;  Location: [facility];  Service: Open Heart Surgery;  Laterality: N/A;  . VSD REPAIR N/A 03/13/2016   Procedure: VENTRICULAR SEPTAL DEFECT (VSD) REPAIR;  Surgeon: Ivin Poot, MD;  Location: [facility];  Service: Open Heart Surgery;  Laterality: N/A;     Current Outpatient Prescriptions  Medication Sig Dispense Refill  . acetaminophen (TYLENOL ARTHRITIS PAIN) 650 MG CR tablet Take 650 mg by mouth every 8 (eight) hours as needed for pain.    [name] albuterol (PROVENTIL HFA;VENTOLIN HFA) 108 (90 Base) MCG/ACT inhaler Inhale 2 puffs into the lungs every 6 (six) hours as needed for wheezing or shortness of breath. 1 Inhaler 0  . aspirin EC 81 MG tablet Take 1 tablet (81 mg total) by mouth daily. 30 tablet 3  . Blood Glucose Monitoring Suppl (ACCU-CHEK AVIVA PLUS) w/Device KIT 1 each by Does not apply route 4 (four) times daily -  before meals and at bedtime. 1 kit 0  . carvedilol (COREG) 3.125 MG tablet Take 3.125 mg by mouth 2 (two) times daily.  0  . Elastic Bandages & Supports (LUMBAR BACK BRACE/SUPPORT PAD) MISC 1 each by Does not apply route daily. 1 each 0  . gabapentin (NEURONTIN) 300 MG capsule take 1 capsule by mouth three times a day if needed for NEUROPATHY PAIN 90 capsule 3  . glucose blood (ACCU-CHEK AVIVA) test strip Check blood sugars three times per day prior to meals and bedtime 360 each 5  . insulin aspart (NOVOLOG) 100 UNIT/ML FlexPen Per sliding scale 15 mL 11  . Insulin Detemir (LEVEMIR FLEXPEN) 100 UNIT/ML Pen Inject 40 Units into the skin daily at 10 pm. 15 mL 11  . Insulin  Pen Needle 31G X 8 MM MISC 1 each by Does not apply route 4 (four) times daily. 100 each 12  . Lancets (ACCU-CHEK SOFT TOUCH) lancets Use as instructed 100 each 12  . losartan (COZAAR) 25 MG tablet Take 12.5 mg by mouth daily.  0  . nitroGLYCERIN (NITRODUR - DOSED IN MG/24 HR) 0.4 mg/hr patch Place 1 patch (0.4 mg total) onto the skin daily. 30 patch 3  . pantoprazole (PROTONIX) 40 MG tablet Take 1 tablet (40 mg total) by mouth daily. 30 tablet 5  . pramipexole (MIRAPEX) 0.125 MG tablet Take 1 tablet (0.125 mg total) by mouth at bedtime. 30 tablet 3  . rosuvastatin (CRESTOR) 40 MG tablet take 1 tablet by mouth once daily 90 tablet 3  . Saline (SIMPLY SALINE) 0.9 % AERS Place 1 each into the nose 2 (two) times daily as needed. 1 Can 0  . silver sulfADIAZINE (SILVADENE) 1 % cream Apply 1 application topically daily. Apply to affected area daily plus dry dressing 400 g 3  . SSD 1 % cream Apply topically daily. 50 g 1  . torsemide (DEMADEX) 20 MG tablet Take 1 tablet (20 mg total) by mouth  3 (three) times a week. Every Mon, Wed and Fri 30 tablet 3  . torsemide (DEMADEX) 20 MG tablet take 1 tablet by mouth THREE TIMES A WEEK, MON, WED, AND FRI 30 tablet 3  . traMADol (ULTRAM) 50 MG tablet Take 1 tablet (50 mg total) by mouth every 6 (six) hours as needed for moderate pain. 30 tablet 0  . warfarin (COUMADIN) 5 MG tablet TAKE 1 AND 1/2  To 2 TABLETS DAILY OR AS DIRECTED BY COUMADIN CLINIC 60 tablet 1   No current facility-administered medications for this visit.     Allergies:   Morphine and related and Latex    Social History:  The patient  reports that he has been smoking Cigarettes.  He has been smoking about 0.50 packs per day. He has never used smokeless tobacco. He reports that he does not drink alcohol or use drugs.   Family History:  Not able to obtain due to distress.  ROS:  Please see the history of present illness.   Otherwise, review of systems are positive for none.   All other systems  are reviewed and negative.    PHYSICAL EXAM: VS:  BP (!) 97/54 (BP Location: Right Arm)   Pulse 77   Ht '5\' 5"'  (1.651 m)   Wt 171 lb (77.6 kg)   BMI 28.46 kg/m  , BMI Body mass index is 28.46 kg/m. GEN: Well nourished, well developed, in no acute distress  HEENT: normal  Neck: no JVD, carotid bruits, or masses Cardiac: RRR; no  rubs, or gallops,no edema . No murmurs Respiratory:  clear to auscultation bilaterally, normal work of breathing GI: soft, nontender, nondistended, + BS MS: no deformity or atrophy  Skin: warm and dry, no rash Neuro:  Strength and sensation are intact Psych: euthymic mood, full affect Vascular: Distal pulses are not palpable.   EKG:  EKG is  ordered today. EKG showed normal sinus rhythm with prior inferior infarct and poor R-wave progression in the precordial leads.   Recent Labs: 06/22/2016: Magnesium 2.3 09/19/2016: Hemoglobin 10.5; Platelets 182 11/13/2016: ALT 28; BUN 27; Creat 1.68; Potassium 5.5; Sodium 142    Lipid Panel    Component Value Date/Time   CHOL 96 03/13/2016 1057   TRIG 146 03/13/2016 1057   HDL 23 (L) 03/13/2016 1057   CHOLHDL 4.2 03/13/2016 1057   VLDL 29 03/13/2016 1057   LDLCALC 44 03/13/2016 1057      Wt Readings from Last 3 Encounters:  03/05/17 171 lb (77.6 kg)  12/18/16 155 lb (70.3 kg)  12/11/16 154 lb (69.9 kg)      No flowsheet data found.    ASSESSMENT AND PLAN:  1.  Peripheral arterial disease :  He is status post recent drug-coated balloon angioplasty of the right SFA . He has significant tibial disease bilaterally. Currently he has no ulceration or claudication. Continue medical therapy.  2. Chronic systolic heart failure: He appears to be euvolemic.   He is doing very well overall and appears to be euvolemic.  Continue small dose carvedilol and losartan.  3.Postoperative atrial fibrillation: Currently in sinus rhythm.He is on anticoagulation with warfarin. We might consider stopping anticoagulation  if no recurrent atrial fibrillation.  4. Coronary artery disease involving native coronary arteries without angina:  status post one-vessel CABG and VSD repair. Continue medical therapy.  5. Diabetes mellitus: Improved from before.  6. Hyperlipidemia: Continue high-dose rosuvastatin.  7. Tobacco use: Unfortunately, he resumed smoking again and had a prolonged discussion with  him about the importance of smoking cessation. He is at high risk for recurrent critical limb ischemia given his extensive peripheral artery disease.   Disposition:   follow-up with me in 6 months  Signed,  [name], MD  03/06/2017 2:07 PM    [place]

## 2017-03-05 NOTE — Patient Instructions (Signed)
Your physician wants you to follow-up in: 6 MONTHS WITH DR ARIDA You will receive a reminder letter in the mail two months in advance. If you don't receive a letter, please call our office to schedule the follow-up appointment.   If you need a refill on your cardiac medications before your next appointment, please call your pharmacy.  

## 2017-03-20 ENCOUNTER — Ambulatory Visit (INDEPENDENT_AMBULATORY_CARE_PROVIDER_SITE_OTHER): Payer: Medicare Other | Admitting: Family Medicine

## 2017-03-20 ENCOUNTER — Encounter: Payer: Self-pay | Admitting: Family Medicine

## 2017-03-20 VITALS — BP 107/54 | HR 79 | Temp 97.7°F | Resp 16 | Ht 65.0 in | Wt 170.0 lb

## 2017-03-20 DIAGNOSIS — E1152 Type 2 diabetes mellitus with diabetic peripheral angiopathy with gangrene: Secondary | ICD-10-CM | POA: Diagnosis not present

## 2017-03-20 DIAGNOSIS — E785 Hyperlipidemia, unspecified: Secondary | ICD-10-CM | POA: Diagnosis not present

## 2017-03-20 DIAGNOSIS — Z794 Long term (current) use of insulin: Secondary | ICD-10-CM | POA: Diagnosis not present

## 2017-03-20 DIAGNOSIS — F17200 Nicotine dependence, unspecified, uncomplicated: Secondary | ICD-10-CM | POA: Diagnosis not present

## 2017-03-20 DIAGNOSIS — Z23 Encounter for immunization: Secondary | ICD-10-CM

## 2017-03-20 LAB — CBC WITH DIFFERENTIAL/PLATELET
BASOS PCT: 0.4 %
Basophils Absolute: 31 cells/uL (ref 0–200)
EOS PCT: 2.7 %
Eosinophils Absolute: 211 cells/uL (ref 15–500)
HEMATOCRIT: 37.7 % — AB (ref 38.5–50.0)
Hemoglobin: 12.5 g/dL — ABNORMAL LOW (ref 13.2–17.1)
LYMPHS ABS: 2278 {cells}/uL (ref 850–3900)
MCH: 28.7 pg (ref 27.0–33.0)
MCHC: 33.2 g/dL (ref 32.0–36.0)
MCV: 86.7 fL (ref 80.0–100.0)
MPV: 10.3 fL (ref 7.5–12.5)
Monocytes Relative: 7.3 %
NEUTROS PCT: 60.4 %
Neutro Abs: 4711 cells/uL (ref 1500–7800)
PLATELETS: 171 10*3/uL (ref 140–400)
RBC: 4.35 10*6/uL (ref 4.20–5.80)
RDW: 13.1 % (ref 11.0–15.0)
Total Lymphocyte: 29.2 %
WBC: 7.8 10*3/uL (ref 3.8–10.8)
WBCMIX: 569 {cells}/uL (ref 200–950)

## 2017-03-20 LAB — COMPLETE METABOLIC PANEL WITH GFR
AG Ratio: 1.8 (calc) (ref 1.0–2.5)
ALKALINE PHOSPHATASE (APISO): 91 U/L (ref 40–115)
ALT: 28 U/L (ref 9–46)
AST: 25 U/L (ref 10–35)
Albumin: 4.3 g/dL (ref 3.6–5.1)
BUN/Creatinine Ratio: 33 (calc) — ABNORMAL HIGH (ref 6–22)
BUN: 41 mg/dL — AB (ref 7–25)
CO2: 27 mmol/L (ref 20–32)
CREATININE: 1.26 mg/dL — AB (ref 0.70–1.25)
Calcium: 9.8 mg/dL (ref 8.6–10.3)
Chloride: 104 mmol/L (ref 98–110)
GFR, Est African American: 69 mL/min/{1.73_m2} (ref >=60)
GFR, Est Non African American: 59 mL/min/{1.73_m2} — ABNORMAL LOW (ref >=60)
Globulin: 2.4 g/dL (calc) (ref 1.9–3.7)
Glucose, Bld: 113 mg/dL — ABNORMAL HIGH (ref 65–99)
Potassium: 4.3 mmol/L (ref 3.5–5.3)
Sodium: 140 mmol/L (ref 135–146)
Total Bilirubin: 0.7 mg/dL (ref 0.2–1.2)
Total Protein: 6.7 g/dL (ref 6.1–8.1)

## 2017-03-20 LAB — LIPID PANEL
Cholesterol: 98 mg/dL (ref <200)
HDL: 33 mg/dL — AB (ref >40)
LDL CHOLESTEROL (CALC): 45 mg/dL
NON-HDL CHOLESTEROL (CALC): 65 mg/dL (ref <130)
TRIGLYCERIDES: 116 mg/dL (ref <150)
Total CHOL/HDL Ratio: 3 (calc) (ref <5.0)

## 2017-03-20 LAB — POCT GLYCOSYLATED HEMOGLOBIN (HGB A1C): HEMOGLOBIN A1C: 9.1

## 2017-03-20 LAB — GLUCOSE, CAPILLARY: Glucose-Capillary: 114 mg/dL — ABNORMAL HIGH (ref 65–99)

## 2017-03-20 MED ORDER — INSULIN PEN NEEDLE 31G X 8 MM MISC: 1.0000 | Freq: Four times a day (QID) | 12 refills | Status: DC

## 2017-03-20 MED ORDER — INSULIN DETEMIR 100 UNIT/ML FLEXPEN: 50.0000 [IU] | PEN_INJECTOR | Freq: Every day | SUBCUTANEOUS | 11 refills | Status: DC

## 2017-03-20 NOTE — Patient Instructions (Signed)
Will increase Levemir to 50 units at bedtime. Recommend that you continue to monitor your carbohydrates.

## 2017-03-21 ENCOUNTER — Telehealth: Payer: Self-pay

## 2017-03-21 NOTE — Telephone Encounter (Signed)
Called, no answer. Left message for patient to call back. Thanks!  

## 2017-03-21 NOTE — Telephone Encounter (Signed)
-----   Message from Dorena Dew, [place] sent at 03/21/2017  5:33 AM EDT ----- Regarding: lab results  Please inform William [name] that creatinine level (indicator of kidney functioning) has improved from previous value, but remains slightly elevated. Will maintain tight control of blood sugars. Remind patient of medication change that was discussed during office visit on 9/19. I increased Levemir to 50 units HS. Will follow up for labs in 1 month.   Thanks ----- Message ----- From: Buel Ream, Lab In [place] Sent: 03/20/2017  10:54 AM To: Dorena Dew, FNP

## 2017-03-24 DIAGNOSIS — F17200 Nicotine dependence, unspecified, uncomplicated: Secondary | ICD-10-CM | POA: Insufficient documentation

## 2017-03-24 NOTE — Progress Notes (Signed)
[name], a 66 year old male with a history of uncontrolled type 2 diabetes mellitus, PAD, diabetic foot ulcer, and neuropathy presents accompanied by daughter [name] for a follow up type 2 diabetes. Patient has started smoking again and has not been following a low carbohydrate diet. His daughter has started a full time job and is no longer able to prepare healthy meals for the family. Previous a1C was 7.8 on 12/18/2016 and levemir was lowered.    Diabetes  He presents for his follow-up diabetic visit. He has type 2 diabetes mellitus. His disease course has been fluctuating. Pertinent negatives for hypoglycemia include no headaches, hunger, mood changes, seizures, speech difficulty, sweats or tremors. Associated symptoms include foot paresthesias (S/P toe amputations). Pertinent negatives for diabetes include no chest pain, no polydipsia and no weakness. Pertinent negatives for hypoglycemia complications include no blackouts. Risk factors for coronary artery disease include dyslipidemia and hypertension. Current diabetic treatment includes insulin injections. He is compliant with treatment most of the time. He is following a diabetic diet. He has not had a previous visit with a dietitian. He never participates in exercise. An ACE inhibitor/angiotensin II receptor blocker is not being taken. He sees a podiatrist.Eye exam is current.   Past Medical History:  Diagnosis Date  . AKI (acute kidney injury) ([place])    With STEMI in 2017  . Chronic systolic CHF (congestive heart failure) ([facility])   . Diabetes mellitus without complication ([facility])   . Hyperlipidemia   . Hypertension   . Paroxysmal atrial fibrillation ([name])   . STEMI (ST elevation myocardial infarction) ([place]) 2017   Social History   Social History  . Marital status: Married    Spouse name: Lavell Luster  . Number of children: N/A  . Years of education: N/A   Occupational History  . Not on file.   Social History Main Topics  . Smoking  status: Light Tobacco Smoker    Packs/day: 0.50    Types: Cigarettes    Last attempt to quit: 11/20/2015  . Smokeless tobacco: Never Used  . Alcohol use No  . Drug use: No  . Sexual activity: Not on file   Other Topics Concern  . Not on file   Social History Narrative  . No narrative on file   Immunization History  Administered Date(s) Administered  . Influenza,inj,Quad PF,6+ Mos 03/23/2016, 03/20/2017  . Pneumococcal Conjugate-13 09/14/2016  . Tdap 09/14/2016   Allergies  Allergen Reactions  . Morphine And Related Shortness Of Breath and Other (See Comments)    UNSPECIFIED REACTION "Pt said it was too much"   . Latex Rash    Review of Systems  Eyes: Negative.   Respiratory: Negative.   Cardiovascular: Positive for PND. Negative for chest pain, palpitations and leg swelling.  Gastrointestinal: Negative.  Negative for abdominal pain.  Genitourinary: Negative.  Negative for dysuria.  Musculoskeletal: Positive for back pain.  Skin:       Generalized erythema and burning  Neurological: Negative for tingling, tremors, seizures, speech difficulty, weakness and headaches.  Endo/Heme/Allergies: Negative for polydipsia.  Psychiatric/Behavioral: Negative.   Physical Exam  Constitutional: He is well-developed, well-nourished, and in no distress.  HENT:  Head: Normocephalic and atraumatic.  Right Ear: External ear normal.  Left Ear: External ear normal.  Nose: Nose normal.  Mouth/Throat: Oropharynx is clear and moist.  Cardiovascular: Normal rate, regular rhythm, normal heart sounds and intact distal pulses.   Pulmonary/Chest: Effort normal and breath sounds normal.  Abdominal: Soft. Bowel sounds  are normal.  Musculoskeletal:       Lumbar back: He exhibits no swelling, no edema and no spasm.  Neurological: He is alert. He exhibits normal muscle tone.  Ambulates with the assistance of cane    BP (!) 107/54 (BP Location: Left Arm, Patient Position: Sitting, Cuff Size:  Normal)   Pulse 79   Temp 97.7 F (36.5 C) (Oral)   Resp 16   Ht 5\' 5"  (1.651 m)   Wt 170 lb (77.1 kg)   SpO2 100%   BMI 28.29 kg/m  Plan  1. Type 2 diabetes mellitus with diabetic peripheral angiopathy and gangrene, with long-term current use of insulin ([name]) Hemoglobin a1C has increased to 9/1. Will increase Levemir to 50 units at bedtime. Also, discussed restarting carbohydrate modified diet at length.  - HgB A1c - Insulin Detemir (LEVEMIR FLEXPEN) 100 UNIT/ML Pen; Inject 50 Units into the skin daily at 10 pm.  Dispense: 15 mL; Refill: 11 - COMPLETE METABOLIC PANEL WITH GFR - CBC with Differential - Lipid Panel  2. Need for immunization against influenza - Flu Vaccine QUAD 36+ mos IM  3. Hyperlipidemia, unspecified hyperlipidemia type - Lipid Panel  4. Tobacco dependence Smoking cessation instruction/counseling given:  counseled patient on the dangers of tobacco use, advised patient to stop smoking, and reviewed strategies to maximize success    RTC: 1 month for fasting labs  Donia Pounds  MSN, [facility] [address] [place], [place] [address] [phone]

## 2017-03-27 ENCOUNTER — Encounter (HOSPITAL_COMMUNITY): Payer: Self-pay | Admitting: Emergency Medicine

## 2017-03-27 DIAGNOSIS — Z7982 Long term (current) use of aspirin: Secondary | ICD-10-CM | POA: Diagnosis not present

## 2017-03-27 DIAGNOSIS — Z79899 Other long term (current) drug therapy: Secondary | ICD-10-CM | POA: Insufficient documentation

## 2017-03-27 DIAGNOSIS — Z7901 Long term (current) use of anticoagulants: Secondary | ICD-10-CM | POA: Diagnosis not present

## 2017-03-27 DIAGNOSIS — Z951 Presence of aortocoronary bypass graft: Secondary | ICD-10-CM | POA: Diagnosis not present

## 2017-03-27 DIAGNOSIS — N183 Chronic kidney disease, stage 3 (moderate): Secondary | ICD-10-CM | POA: Insufficient documentation

## 2017-03-27 DIAGNOSIS — F17210 Nicotine dependence, cigarettes, uncomplicated: Secondary | ICD-10-CM | POA: Diagnosis not present

## 2017-03-27 DIAGNOSIS — I130 Hypertensive heart and chronic kidney disease with heart failure and stage 1 through stage 4 chronic kidney disease, or unspecified chronic kidney disease: Secondary | ICD-10-CM | POA: Insufficient documentation

## 2017-03-27 DIAGNOSIS — I5022 Chronic systolic (congestive) heart failure: Secondary | ICD-10-CM | POA: Diagnosis not present

## 2017-03-27 DIAGNOSIS — Z794 Long term (current) use of insulin: Secondary | ICD-10-CM | POA: Diagnosis not present

## 2017-03-27 DIAGNOSIS — Z91040 Latex allergy status: Secondary | ICD-10-CM | POA: Insufficient documentation

## 2017-03-27 DIAGNOSIS — I2510 Atherosclerotic heart disease of native coronary artery without angina pectoris: Secondary | ICD-10-CM | POA: Insufficient documentation

## 2017-03-27 DIAGNOSIS — I252 Old myocardial infarction: Secondary | ICD-10-CM | POA: Insufficient documentation

## 2017-03-27 DIAGNOSIS — E1122 Type 2 diabetes mellitus with diabetic chronic kidney disease: Secondary | ICD-10-CM | POA: Insufficient documentation

## 2017-03-27 DIAGNOSIS — E162 Hypoglycemia, unspecified: Secondary | ICD-10-CM | POA: Diagnosis present

## 2017-03-27 LAB — CBC WITH DIFFERENTIAL/PLATELET
BASOS ABS: 0 10*3/uL (ref 0.0–0.1)
BASOS PCT: 0 %
Eosinophils Absolute: 0.2 10*3/uL (ref 0.0–0.7)
Eosinophils Relative: 3 %
HEMATOCRIT: 36.9 % — AB (ref 39.0–52.0)
HEMOGLOBIN: 12.1 g/dL — AB (ref 13.0–17.0)
Lymphocytes Relative: 34 %
Lymphs Abs: 3.3 10*3/uL (ref 0.7–4.0)
MCH: 28.5 pg (ref 26.0–34.0)
MCHC: 32.8 g/dL (ref 30.0–36.0)
MCV: 86.8 fL (ref 78.0–100.0)
MONOS PCT: 7 %
Monocytes Absolute: 0.6 10*3/uL (ref 0.1–1.0)
NEUTROS ABS: 5.4 10*3/uL (ref 1.7–7.7)
NEUTROS PCT: 56 %
Platelets: 189 10*3/uL (ref 150–400)
RBC: 4.25 MIL/uL (ref 4.22–5.81)
RDW: 14.1 % (ref 11.5–15.5)
WBC: 9.6 10*3/uL (ref 4.0–10.5)

## 2017-03-27 LAB — CBG MONITORING, ED: Glucose-Capillary: 53 mg/dL — ABNORMAL LOW (ref 65–99)

## 2017-03-27 MED ORDER — DEXTROSE 50 % IV SOLN
1.0000 | Freq: Once | INTRAVENOUS | Status: AC
  Administered 2017-03-27: 50 mL via INTRAVENOUS
  Filled 2017-03-27: qty 50

## 2017-03-27 NOTE — ED Triage Notes (Signed)
Patient reports low blood sugar at home this evening 42 ,  CBG = 53 at triage , alert and oriented at arrival , denies pain/respirations unlabored , mild fatigue .

## 2017-03-27 NOTE — ED Notes (Signed)
Patient given orange juice and [place] sandwich.

## 2017-03-27 NOTE — ED Notes (Signed)
Dr. Darl Householder notified on pt.'s hypoglycemia.

## 2017-03-28 ENCOUNTER — Emergency Department (HOSPITAL_COMMUNITY)
Admission: EM | Admit: 2017-03-28 | Discharge: 2017-03-28 | Disposition: A | Discharge status: Home / Self Care | Payer: Medicare Other | Attending: Emergency Medicine | Admitting: Emergency Medicine

## 2017-03-28 DIAGNOSIS — E162 Hypoglycemia, unspecified: Secondary | ICD-10-CM

## 2017-03-28 LAB — COMPREHENSIVE METABOLIC PANEL
ALK PHOS: 90 U/L (ref 38–126)
ALT: 28 U/L (ref 17–63)
ANION GAP: 10 (ref 5–15)
AST: 25 U/L (ref 15–41)
Albumin: 4.2 g/dL (ref 3.5–5.0)
BILIRUBIN TOTAL: 0.6 mg/dL (ref 0.3–1.2)
BUN: 35 mg/dL — ABNORMAL HIGH (ref 6–20)
CO2: 24 mmol/L (ref 22–32)
CREATININE: 1.54 mg/dL — AB (ref 0.61–1.24)
Calcium: 9.2 mg/dL (ref 8.9–10.3)
Chloride: 102 mmol/L (ref 101–111)
GFR calc non Af Amer: 46 mL/min — ABNORMAL LOW (ref >60)
GFR, EST AFRICAN AMERICAN: 53 mL/min — AB (ref >60)
Glucose, Bld: 68 mg/dL (ref 65–99)
Potassium: 3.4 mmol/L — ABNORMAL LOW (ref 3.5–5.1)
SODIUM: 136 mmol/L (ref 135–145)
Total Protein: 7 g/dL (ref 6.5–8.1)

## 2017-03-28 LAB — CBG MONITORING, ED
GLUCOSE-CAPILLARY: 230 mg/dL — AB (ref 65–99)
GLUCOSE-CAPILLARY: 266 mg/dL — AB (ref 65–99)
Glucose-Capillary: 284 mg/dL — ABNORMAL HIGH (ref 65–99)

## 2017-03-28 NOTE — ED Provider Notes (Signed)
[facility] DEPT Provider Note   CSN: [id number] Arrival date & time: 03/27/17  2311     History   Chief Complaint Chief Complaint  Patient presents with  . Hypoglycemia    HPI William [name] is a 66 y.o. male.  The history is provided by the patient.  Hypoglycemia  Initial blood sugar:  50 at home Severity:  Moderate Onset quality:  Sudden Timing:  Constant Progression:  Improving Chronicity:  New Diabetic status:  Controlled with insulin Relieved by:  Eating Associated symptoms: no shortness of breath and no vomiting    Pt with h/o diabetes who is managed on levemir at night as well insulin sliding scale during the day He reports he was working outside today and began feeling lightheaded and nausea He checked his glucose and it was around 50  Denies cp/sob No fever/vomiting He had otherwise been well  He reports he took his levemir tonight prior to arrival  Past Medical History:  Diagnosis Date  . AKI (acute kidney injury) ([facility])    With STEMI in 2017  . Chronic systolic CHF (congestive heart failure) ([place])   . Diabetes mellitus without complication ([place])   . Hyperlipidemia   . Hypertension   . Paroxysmal atrial fibrillation ([name])   . STEMI (ST elevation myocardial infarction) [facility]) 2017    Patient Active Problem List   Diagnosis Date Noted  . Tobacco dependence 03/24/2017  . Lower back injury 10/31/2016  . Acute right-sided low back pain with right-sided sciatica 10/31/2016  . 'light-for-dates' infant with signs of fetal malnutrition 10/26/2016  . Diabetic ulcer of left great toe ([facility]) 09/17/2016  . Peripheral vascular disorder ([facility]) 09/17/2016  . S/P transmetatarsal amputation of foot, right ([place]) 07/06/2016  . Abnormality of gait 07/06/2016  . CKD (chronic kidney disease) stage 3, GFR 30-59 ml/min   . Osteomyelitis ([place]) 06/21/2016  . Sepsis, unspecified organism ([place]) 06/21/2016  . AKI (acute kidney injury) ([facility]) 06/21/2016  . Anemia 06/21/2016    . Hyponatremia 06/21/2016  . Atherosclerosis of native artery of right lower extremity with gangrene ([place]) 06/05/2016  . Chronic systolic HF (heart failure) ([place] [facility]) 05/28/2016  . Hypoalbuminemia 03/21/2016  . Coronary artery disease due to lipid rich plaque 03/16/2016  . VSD (ventricular septal defect) 03/16/2016  . Hx of CABG   . Paroxysmal atrial fibrillation ([facility]) 03/15/2016  . Erectile dysfunction associated with type 2 diabetes mellitus ([place]) 04/07/2012  . Mood disorder ([facility]) 03/12/2011  . Essential hypertension 03/17/2010  . ONYCHOMYCOSIS, TOENAILS 01/03/2010  . NICOTINE ADDICTION 12/28/2008  . Hyperlipidemia 11/01/2008  . GERD 11/01/2008  . SHOULDER PAIN, LEFT, CHRONIC 11/01/2008  . NECK PAIN, CHRONIC 11/01/2008  . Type 2 diabetes mellitus with diabetic peripheral angiopathy and gangrene, with long-term current use of insulin ([place]) 09/23/2008    Past Surgical History:  Procedure Laterality Date  . ABDOMINAL AORTOGRAM W/LOWER EXTREMITY N/A 09/19/2016   Procedure: Abdominal Aortogram w/Lower Extremity;  Surgeon: Wellington Hampshire, MD;  Location: [facility];  Service: Cardiovascular;  Laterality: N/A;  . AMPUTATION Right 03/23/2016   Procedure: 1st and 2nd Ray Amputation Right Foot;  Surgeon: Newt Minion, MD;  Location: [facility];  Service: Orthopedics;  Laterality: Right;  . AMPUTATION Right 06/21/2016   Procedure: RIGHT TRANSMETATARSAL AMPUTATION;  Surgeon: Newt Minion, MD;  Location: [facility];  Service: Orthopedics;  Laterality: Right;  . CARDIAC CATHETERIZATION N/A 03/13/2016   Procedure: Right/Left Heart Cath and Coronary Angiography;  Surgeon: Sherren Mocha, MD;  Location: [facility]  INVASIVE CV LAB;  Service: Cardiovascular;  Laterality: N/A;  . CARDIAC CATHETERIZATION N/A 03/13/2016   Procedure: IABP Insertion;  Surgeon: Sherren Mocha, MD;  Location: [facility] CV LAB;  Service: Cardiovascular;  Laterality: N/A;  . CORONARY ARTERY BYPASS GRAFT N/A 03/13/2016   Procedure:  CORONARY ARTERY BYPASS GRAFTING (CABG) x 1 (SVG to OM) with EVH from [place];  Surgeon: Ivin Poot, MD;  Location: [facility];  Service: Open Heart Surgery;  Laterality: N/A;  . LOWER EXTREMITY ANGIOGRAM  05/02/2016   Procedure: Lower Extremity Angiogram;  Surgeon: Wellington Hampshire, MD;  Location: [facility] CV LAB;  Service: Cardiovascular;;  Limited left femoral runoff right femoral runoff  . PERIPHERAL VASCULAR CATHETERIZATION N/A 05/02/2016   Procedure: Abdominal Aortogram;  Surgeon: Wellington Hampshire, MD;  Location: [facility] CV LAB;  Service: Cardiovascular;  Laterality: N/A;  . PERIPHERAL VASCULAR CATHETERIZATION Right 05/02/2016   Procedure: Peripheral Vascular Balloon Angioplasty;  Surgeon: Wellington Hampshire, MD;  Location: [facility];  Service: Cardiovascular;  Laterality: Right;  SFA  . TEE WITHOUT CARDIOVERSION N/A 03/13/2016   Procedure: TRANSESOPHAGEAL ECHOCARDIOGRAM (TEE);  Surgeon: Ivin Poot, MD;  Location: [facility];  Service: Open Heart Surgery;  Laterality: N/A;  . VSD REPAIR N/A 03/13/2016   Procedure: VENTRICULAR SEPTAL DEFECT (VSD) REPAIR;  Surgeon: Ivin Poot, MD;  Location: [facility];  Service: Open Heart Surgery;  Laterality: N/A;       Home Medications    Prior to Admission medications   Medication Sig Start Date End Date Taking? Authorizing Provider  acetaminophen (TYLENOL ARTHRITIS PAIN) 650 MG CR tablet Take 650 mg by mouth every 8 (eight) hours as needed for pain.    [provider]  albuterol (PROVENTIL HFA;VENTOLIN HFA) 108 (90 Base) MCG/ACT inhaler Inhale 2 puffs into the lungs every 6 (six) hours as needed for wheezing or shortness of breath. 12/18/16   Dorena Dew, FNP  aspirin EC 81 MG tablet Take 1 tablet (81 mg total) by mouth daily. 09/21/16   Dorena Dew, FNP  Blood Glucose Monitoring Suppl (ACCU-CHEK AVIVA PLUS) w/Device KIT 1 each by Does not apply route 4 (four) times daily -  before meals and at bedtime.  07/06/16   Dorena Dew, FNP  carvedilol (COREG) 3.125 MG tablet Take 3.125 mg by mouth 2 (two) times daily. 01/27/17   [provider]  gabapentin (NEURONTIN) 300 MG capsule take 1 capsule by mouth three times a day if needed for NEUROPATHY PAIN 11/27/16   Suzan Slick, NP  glucose blood (ACCU-CHEK AVIVA) test strip Check blood sugars three times per day prior to meals and bedtime 09/14/16   Dorena Dew, FNP  insulin aspart (NOVOLOG) 100 UNIT/ML FlexPen Per sliding scale 09/21/16   Dorena Dew, FNP  Insulin Detemir (LEVEMIR FLEXPEN) 100 UNIT/ML Pen Inject 50 Units into the skin daily at 10 pm. 03/20/17   Dorena Dew, FNP  Insulin Pen Needle 31G X 8 MM MISC 1 each by Does not apply route 4 (four) times daily. 03/20/17   Dorena Dew, FNP  Lancets (ACCU-CHEK SOFT TOUCH) lancets Use as instructed 07/06/16   Dorena Dew, FNP  losartan (COZAAR) 25 MG tablet Take 12.5 mg by mouth daily. 01/05/17   [provider]  nitroGLYCERIN (NITRODUR - DOSED IN MG/24 HR) 0.4 mg/hr patch Place 1 patch (0.4 mg total) onto the skin daily. 09/21/16   Dorena Dew, FNP  pantoprazole (PROTONIX) 40 MG tablet Take 1 tablet (40 mg total) by mouth daily. 12/18/16   Dorena Dew, FNP  pramipexole (MIRAPEX) 0.125 MG tablet Take 1 tablet (0.125 mg total) by mouth at bedtime. 09/21/16   Dorena Dew, FNP  rosuvastatin (CRESTOR) 40 MG tablet take 1 tablet by mouth once daily 11/02/16   Bensimhon, Shaune Pascal, MD  Saline (SIMPLY SALINE) 0.9 % AERS Place 1 each into the nose 2 (two) times daily as needed. Patient not taking: Reported on 03/20/2017 12/18/16   Dorena Dew, FNP  silver sulfADIAZINE (SILVADENE) 1 % cream Apply 1 application topically daily. Apply to affected area daily plus dry dressing 11/19/16   Newt Minion, MD  SSD 1 % cream Apply topically daily. 10/08/16   Suzan Slick, NP  torsemide (DEMADEX) 20 MG tablet Take 1 tablet (20 mg total) by mouth 3 (three) times a  week. Every Mon, Wed and Fri 09/21/16   Dorena Dew, FNP  warfarin (COUMADIN) 5 MG tablet TAKE 1 AND 1/2  To 2 TABLETS DAILY OR AS DIRECTED BY COUMADIN CLINIC 03/05/17   Wellington Hampshire, MD    Family History Family History  Problem Relation Age of Onset  . Diabetes Maternal Grandmother   . Diabetes Mother   . Aneurysm Mother   . Peripheral Artery Disease Mother   . Coronary artery disease Mother   . Peptic Ulcer Father     Social History Social History  Substance Use Topics  . Smoking status: Light Tobacco Smoker    Packs/day: 0.50    Types: Cigarettes    Last attempt to quit: 11/20/2015  . Smokeless tobacco: Never Used  . Alcohol use No     Allergies   Morphine and related and Latex   Review of Systems Review of Systems  Constitutional: Negative for fever.  Respiratory: Negative for shortness of breath.   Gastrointestinal: Negative for vomiting.  All other systems reviewed and are negative.    Physical Exam Updated Vital Signs BP 124/69   Pulse 66   Temp 98.3 F (36.8 C) (Oral)   Resp 13   SpO2 99%   Physical Exam CONSTITUTIONAL: Well developed/well nourished HEAD: Normocephalic/atraumatic EYES: EOMI/PERRL ENMT: Mucous membranes moist NECK: supple no meningeal signs SPINE/BACK:entire spine nontender CV:   no murmurs/rubs/gallops noted LUNGS: Lungs are clear to auscultation bilaterally, no apparent distress Chest - midline sternotomy scar, well healed ABDOMEN: soft, nontender  GU:no cva tenderness NEURO: Pt is awake/alert/appropriate, moves all extremitiesx4.  No facial droop.   EXTREMITIES: pulses normal/equal, full ROM SKIN: warm, color normal PSYCH: no abnormalities of mood noted, alert and oriented to situation   ED Treatments / Results  Labs (all labs ordered are listed, but only abnormal results are displayed) Labs Reviewed  CBC WITH DIFFERENTIAL/PLATELET - Abnormal; Notable for the following:       Result Value   Hemoglobin 12.1 (*)     HCT 36.9 (*)    All other components within normal limits  COMPREHENSIVE METABOLIC PANEL - Abnormal; Notable for the following:    Potassium 3.4 (*)    BUN 35 (*)    Creatinine, Ser 1.54 (*)    GFR calc non Af Amer 46 (*)    GFR calc Af Amer 53 (*)    All other components within normal limits  CBG MONITORING, ED - Abnormal; Notable for the following:    Glucose-Capillary 53 (*)    All other components within normal limits  CBG MONITORING, ED - Abnormal; Notable for the following:    Glucose-Capillary 230 (*)    All other components within normal limits  CBG MONITORING, ED - Abnormal; Notable for the following:    Glucose-Capillary 266 (*)    All other components within normal limits  CBG MONITORING, ED - Abnormal; Notable for the following:    Glucose-Capillary 284 (*)    All other components within normal limits    EKG  EKG Interpretation None       Radiology No results found.  Procedures Procedures (including critical care time)  Medications Ordered in ED Medications  dextrose 50 % solution 50 mL (50 mLs Intravenous Given 03/27/17 2339)     Initial Impression / Assessment and Plan / ED Course  I have reviewed the triage vital signs and the nursing notes.  Pertinent labs   results that were available during my care of the patient were reviewed by me and considered in my medical decision making (see chart for details).     By the time of my evaluation ,pt improved and he had been eating [place] sandwich Glucose improved after several hrs in the ED Advised to hold levemir today and monitor glucose/sliding scale  Pt agreeable with plan   Final Clinical Impressions(s) / ED Diagnoses   Final diagnoses:  Hypoglycemia    New Prescriptions Discharge Medication List as of 03/28/2017  3:20 AM       Ripley Fraise, MD 03/28/17 [phone]

## 2017-03-28 NOTE — Discharge Instructions (Signed)
NO LEVEMIR TONIGHT GO BACK TO [facility] TOMORROW AND BE SURE TO WATCH YOUR SUGAR CLOSELY

## 2017-03-30 ENCOUNTER — Other Ambulatory Visit: Payer: Self-pay | Admitting: Family Medicine

## 2017-03-30 ENCOUNTER — Other Ambulatory Visit (INDEPENDENT_AMBULATORY_CARE_PROVIDER_SITE_OTHER): Payer: Self-pay | Admitting: Family

## 2017-03-30 DIAGNOSIS — Z794 Long term (current) use of insulin: Principal | ICD-10-CM

## 2017-03-30 DIAGNOSIS — E118 Type 2 diabetes mellitus with unspecified complications: Secondary | ICD-10-CM

## 2017-03-30 MED ORDER — INSULIN LISPRO 100 UNIT/ML (KWIKPEN): PEN_INJECTOR | SUBCUTANEOUS | 11 refills | Status: DC

## 2017-03-30 NOTE — Progress Notes (Signed)
Received a call from pharmacy via on-call service indicating that Novolog is not covered by patient's insurance. Medication changed to Humalog, verbal authorization given to [place] at [place] at [employer].  Carroll Sage. Kenton Kingfisher, MSN, FNP-C [facility]  [address] [place], [place] [address] [phone]

## 2017-04-01 ENCOUNTER — Other Ambulatory Visit: Payer: Self-pay | Admitting: Family Medicine

## 2017-04-16 ENCOUNTER — Other Ambulatory Visit (INDEPENDENT_AMBULATORY_CARE_PROVIDER_SITE_OTHER): Payer: Medicare Other | Admitting: Family Medicine

## 2017-04-16 DIAGNOSIS — Z794 Long term (current) use of insulin: Secondary | ICD-10-CM

## 2017-04-16 DIAGNOSIS — E1152 Type 2 diabetes mellitus with diabetic peripheral angiopathy with gangrene: Secondary | ICD-10-CM

## 2017-04-17 LAB — BASIC METABOLIC PANEL WITH GFR
BUN / CREAT RATIO: 24 (calc) — AB (ref 6–22)
BUN: 43 mg/dL — ABNORMAL HIGH (ref 7–25)
CHLORIDE: 100 mmol/L (ref 98–110)
CO2: 25 mmol/L (ref 20–32)
Calcium: 9.8 mg/dL (ref 8.6–10.3)
Creat: 1.82 mg/dL — ABNORMAL HIGH (ref 0.70–1.25)
GFR, EST AFRICAN AMERICAN: 44 mL/min/{1.73_m2} — AB (ref >=60)
GFR, Est Non African American: 38 mL/min/{1.73_m2} — ABNORMAL LOW (ref >=60)
Glucose, Bld: 218 mg/dL — ABNORMAL HIGH (ref 65–99)
POTASSIUM: 4.2 mmol/L (ref 3.5–5.3)
Sodium: 135 mmol/L (ref 135–146)

## 2017-04-17 LAB — HEMOGLOBIN A1C
HEMOGLOBIN A1C: 9.1 %{Hb} — AB (ref <5.7)
Mean Plasma Glucose: 214 (calc)
eAG (mmol/L): 11.9 (calc)

## 2017-04-24 ENCOUNTER — Other Ambulatory Visit (INDEPENDENT_AMBULATORY_CARE_PROVIDER_SITE_OTHER): Payer: Self-pay | Admitting: Family

## 2017-05-13 ENCOUNTER — Other Ambulatory Visit: Payer: Self-pay | Admitting: Cardiovascular Disease

## 2017-05-13 NOTE — Telephone Encounter (Signed)
No show to last INR check.  F/U appt tomorrow 11/13 at 3pm

## 2017-05-13 NOTE — Telephone Encounter (Signed)
Please review for refill, thanks ! 

## 2017-05-13 NOTE — Telephone Encounter (Signed)
Please review....looks like pt missed his last INR check, he is currently in the hospital.

## 2017-05-14 ENCOUNTER — Ambulatory Visit (INDEPENDENT_AMBULATORY_CARE_PROVIDER_SITE_OTHER): Payer: Medicare Other | Admitting: Pharmacist Clinician (PhC)/ Clinical Pharmacy Specialist

## 2017-05-14 DIAGNOSIS — I480 Paroxysmal atrial fibrillation: Secondary | ICD-10-CM | POA: Diagnosis not present

## 2017-05-14 DIAGNOSIS — Z5181 Encounter for therapeutic drug level monitoring: Secondary | ICD-10-CM | POA: Diagnosis not present

## 2017-05-14 LAB — POCT INR: INR: 2.3

## 2017-05-31 ENCOUNTER — Telehealth: Payer: Self-pay | Admitting: Cardiovascular Disease

## 2017-05-31 MED ORDER — WARFARIN SODIUM 5 MG PO TABS: ORAL_TABLET | ORAL | 2 refills | Status: DC

## 2017-05-31 NOTE — Telephone Encounter (Signed)
[facility]   *STAT* If patient is at the pharmacy, call can be transferred to refill team.   1. Which medications need to be refilled? (please list name of each medication and dose if known) Warfarin 5mg    2. Which pharmacy/location (including street and city if local pharmacy) is medication to be sent to? [facility]  3. Do they need a 30 day or 90 day supply? Pt will be out of medication on Saturday. Please call back to discuss

## 2017-06-12 ENCOUNTER — Ambulatory Visit (HOSPITAL_COMMUNITY)
Admission: RE | Admit: 2017-06-12 | Discharge: 2017-06-12 | Disposition: A | Discharge status: Home / Self Care | Payer: Medicare Other | Source: Ambulatory Visit | Attending: Internal Medicine | Admitting: Internal Medicine

## 2017-06-12 ENCOUNTER — Encounter (HOSPITAL_COMMUNITY): Payer: Self-pay | Admitting: Internal Medicine

## 2017-06-12 VITALS — BP 97/50 | HR 78 | Wt 176.2 lb

## 2017-06-12 DIAGNOSIS — F17210 Nicotine dependence, cigarettes, uncomplicated: Secondary | ICD-10-CM | POA: Insufficient documentation

## 2017-06-12 DIAGNOSIS — I5022 Chronic systolic (congestive) heart failure: Secondary | ICD-10-CM

## 2017-06-12 DIAGNOSIS — Z79899 Other long term (current) drug therapy: Secondary | ICD-10-CM | POA: Diagnosis not present

## 2017-06-12 DIAGNOSIS — Z8249 Family history of ischemic heart disease and other diseases of the circulatory system: Secondary | ICD-10-CM | POA: Diagnosis not present

## 2017-06-12 DIAGNOSIS — Z89411 Acquired absence of right great toe: Secondary | ICD-10-CM | POA: Diagnosis not present

## 2017-06-12 DIAGNOSIS — I480 Paroxysmal atrial fibrillation: Secondary | ICD-10-CM | POA: Diagnosis not present

## 2017-06-12 DIAGNOSIS — I252 Old myocardial infarction: Secondary | ICD-10-CM | POA: Diagnosis not present

## 2017-06-12 DIAGNOSIS — I2510 Atherosclerotic heart disease of native coronary artery without angina pectoris: Secondary | ICD-10-CM

## 2017-06-12 DIAGNOSIS — Z833 Family history of diabetes mellitus: Secondary | ICD-10-CM | POA: Insufficient documentation

## 2017-06-12 DIAGNOSIS — E875 Hyperkalemia: Secondary | ICD-10-CM | POA: Diagnosis not present

## 2017-06-12 DIAGNOSIS — I739 Peripheral vascular disease, unspecified: Secondary | ICD-10-CM | POA: Diagnosis not present

## 2017-06-12 DIAGNOSIS — E785 Hyperlipidemia, unspecified: Secondary | ICD-10-CM | POA: Diagnosis not present

## 2017-06-12 DIAGNOSIS — Z720 Tobacco use: Secondary | ICD-10-CM | POA: Diagnosis not present

## 2017-06-12 DIAGNOSIS — Z794 Long term (current) use of insulin: Secondary | ICD-10-CM | POA: Diagnosis not present

## 2017-06-12 DIAGNOSIS — I110 Hypertensive heart disease with heart failure: Secondary | ICD-10-CM | POA: Diagnosis present

## 2017-06-12 DIAGNOSIS — E119 Type 2 diabetes mellitus without complications: Secondary | ICD-10-CM | POA: Insufficient documentation

## 2017-06-12 DIAGNOSIS — Z7982 Long term (current) use of aspirin: Secondary | ICD-10-CM | POA: Insufficient documentation

## 2017-06-12 DIAGNOSIS — Z951 Presence of aortocoronary bypass graft: Secondary | ICD-10-CM | POA: Diagnosis not present

## 2017-06-12 DIAGNOSIS — I951 Orthostatic hypotension: Secondary | ICD-10-CM | POA: Diagnosis not present

## 2017-06-12 DIAGNOSIS — Z8489 Family history of other specified conditions: Secondary | ICD-10-CM | POA: Insufficient documentation

## 2017-06-12 DIAGNOSIS — Z7901 Long term (current) use of anticoagulants: Secondary | ICD-10-CM | POA: Diagnosis not present

## 2017-06-12 NOTE — Patient Instructions (Signed)
Please follow up with Dr.Arida.  Follow up with Dr.Bensimhon as needed. Per Dr.Bensimhon please quit smoking.

## 2017-06-12 NOTE — Progress Notes (Signed)
Advanced Heart Failure Clinic Note   PCP: Dr Pecolia Ades Primary HF Cardiologist: Dr Haroldine Laws Orthopedic: [name] Cardiac Surgeon: Dr Darcey Nora  HPI: Mr Dockter is a 66 year old with a history of CAD with inferior MI complicated by acute VSD and cardiogenic shock, S/P CABG x1 with VSD repair on 03/13/16, S/P R 1st and 2nd toe amputation 03/24/2016 followed by R transmetatarsal amputation 12/17, DMII, PAF, and hyperlipidemia.    Admitted September 2017 with chest pain. Inferior MI c/b acute VSD and cardiogenic shock required CABG x 1 with VSD repair on 03/13/2016. Post op course prolonged due to AF and cardiogenic shock due to RV failure  Slow wean off milrinone due low mixed venous saturation. Also had amputation of R 1st and 2nd toe for osteo.  Discharge weight was 150 pounds. He was not discharged on bb or ace with hypotension.   Underwent stent to R SFA 11/17. Admitted in 12/17 with sepsis due to R foot gangrene. Underwent R transmetatarsal amputation.   Followed with Dr. Fletcher Anon. Ab aortogram 3/18 with 1-vessel runoff on left.  He presents today for HF follow up. Breathing better. But back to smoking cigarettes 0.5-1ppd. Daughter very upset. No CP. No claudication. No edema, orthopnea, or PND. Taking all medicines as prescribed. Taking torsemide 20 mg M/W/F.   ECHO 08/2016  EF 40-45% Inferior AK. VSD patch looks good. Mild MR. RV ok  ECHO 03/19/2016 EF 40-45%. RV normal  Labs 04/02/2016: K 4.2 Creatinine 1.41    ROS: All systems negative except as listed in HPI, PMH and Problem List.  SH:  Social History   Socioeconomic History  . Marital status: Married    Spouse name: Lavell Luster  . Number of children: Not on file  . Years of education: Not on file  . Highest education level: Not on file  Social Needs  . Financial resource strain: Not on file  . Food insecurity - worry: Not on file  . Food insecurity - inability: Not on file  . Transportation needs - medical: Not on file  .  Transportation needs - non-medical: Not on file  Occupational History  . Not on file  Tobacco Use  . Smoking status: Current Every Day Smoker    Packs/day: 0.50    Types: Cigarettes    Last attempt to quit: 11/20/2015    Years since quitting: 1.5  . Smokeless tobacco: Never Used  Substance and Sexual Activity  . Alcohol use: No  . Drug use: No  . Sexual activity: Not on file  Other Topics Concern  . Not on file  Social History Narrative  . Not on file    FH:  Family History  Problem Relation Age of Onset  . Diabetes Maternal Grandmother   . Diabetes Mother   . Aneurysm Mother   . Peripheral Artery Disease Mother   . Coronary artery disease Mother   . Peptic Ulcer Father     Past Medical History:  Diagnosis Date  . AKI (acute kidney injury) ([place])    With STEMI in 2017  . Chronic systolic CHF (congestive heart failure) ([place])   . Diabetes mellitus without complication ([place])   . Hyperlipidemia   . Hypertension   . Paroxysmal atrial fibrillation ([name])   . STEMI (ST elevation myocardial infarction) ([place]) 2017    Current Outpatient Medications  Medication Sig Dispense Refill  . acetaminophen (TYLENOL ARTHRITIS PAIN) 650 MG CR tablet Take 650 mg by mouth every 8 (eight) hours as needed for pain.    [name]  albuterol (PROVENTIL HFA;VENTOLIN HFA) 108 (90 Base) MCG/ACT inhaler Inhale 2 puffs into the lungs every 6 (six) hours as needed for wheezing or shortness of breath. 1 Inhaler 0  . aspirin EC 81 MG tablet Take 1 tablet (81 mg total) by mouth daily. 30 tablet 3  . Blood Glucose Monitoring Suppl (ACCU-CHEK AVIVA PLUS) w/Device KIT 1 each by Does not apply route 4 (four) times daily -  before meals and at bedtime. 1 kit 0  . carvedilol (COREG) 3.125 MG tablet Take 3.125 mg by mouth 2 (two) times daily.  0  . gabapentin (NEURONTIN) 300 MG capsule take 1 capsule by mouth three times a day if needed for NEUROPATHY PAIN 90 capsule 3  . glucose blood (ACCU-CHEK AVIVA) test strip Check  blood sugars three times per day prior to meals and bedtime 360 each 5  . Insulin Detemir (LEVEMIR FLEXPEN) 100 UNIT/ML Pen Inject 50 Units into the skin daily at 10 pm. 15 mL 11  . insulin lispro (HUMALOG) 100 UNIT/ML KiwkPen Inject insulin subcutaneously according sliding scale 15 mL 11  . Insulin Pen Needle 31G X 8 MM MISC 1 each by Does not apply route 4 (four) times daily. 100 each 12  . Lancets (ACCU-CHEK SOFT TOUCH) lancets Use as instructed 100 each 12  . losartan (COZAAR) 25 MG tablet Take 12.5 mg by mouth daily.  0  . nitroGLYCERIN (NITRODUR - DOSED IN MG/24 HR) 0.4 mg/hr patch Place 1 patch (0.4 mg total) onto the skin daily. 30 patch 3  . pantoprazole (PROTONIX) 40 MG tablet Take 1 tablet (40 mg total) by mouth daily. 30 tablet 5  . pramipexole (MIRAPEX) 0.125 MG tablet Take 1 tablet (0.125 mg total) by mouth at bedtime. 30 tablet 3  . rosuvastatin (CRESTOR) 40 MG tablet take 1 tablet by mouth once daily 90 tablet 3  . silver sulfADIAZINE (SILVADENE) 1 % cream Apply 1 application topically daily. Apply to affected area daily plus dry dressing 400 g 3  . SSD 1 % cream Apply topically daily. 50 g 1  . torsemide (DEMADEX) 20 MG tablet Take 1 tablet (20 mg total) by mouth 3 (three) times a week. Every Mon, Wed and Fri 30 tablet 3  . warfarin (COUMADIN) 5 MG tablet Take 1.5 to 2 tablets by mouth daily as directed by coumadin clinic 60 tablet 2   No current facility-administered medications for this encounter.     Vitals:   06/12/17 1013  BP: (!) 97/50  Pulse: 78  SpO2: 100%  Weight: 176 lb 4 oz (79.9 kg)   Wt Readings from Last 3 Encounters:  06/12/17 176 lb 4 oz (79.9 kg)  03/20/17 170 lb (77.1 kg)  03/05/17 171 lb (77.6 kg)     PHYSICAL EXAM:  General:  Well appearing. No resp difficulty HEENT: normal Neck: supple. no JVD. Carotids 2+ bilat; no bruits. No lymphadenopathy or thryomegaly appreciated. Cor: PMI nondisplaced. Regular rate & rhythm. No rubs, gallops or  murmurs. Lungs: clear decreased throughout Abdomen: soft, nontender, nondistended. No hepatosplenomegaly. No bruits or masses. Good bowel sounds. Extremities: no cyanosis, clubbing, rash, edema  Healed eschar on left great toe Neuro: alert & orientedx3, cranial nerves grossly intact. moves all 4 extremities w/o difficulty. Affect pleasant    ASSESSMENT & PLAN: 1. Inferior STEMI CAD-->S/P CABG x1 VSD Repair with RV failure in 9/17  - No s/s ischemia - Continue ASA + Crestor - Continue Coreg 3.149m BID.  2. Chronic Systolic Heart Failure- EF  40-45% in 9/17 , EF 40-45% in March 2018.   - NYHA II - Euvolemic on exam, continue torsemide 3x a week.  - Off Spironolactone due to hyperkalemia (5.5) - Continue losartan 12.5 mg daily, will not increase with soft BP.  3. PAD Ischemic R Great Toe s/p amputation 9/22 followed by R transmet amputation 12/17 - Stable. - Followed by Dr. Fletcher Anon. Daughter worried about spot on left great toe but it looks ok today. Reminded of need to stop smoking and discussed foot hygiene.  4. Hyperlipidemia - Continue Crestor 45m daily.  - Goal LDL < 70. Followed by Dr. AFletcher Anon5. DM Type II - A1c in March 2018 was 9.3 - Consider Jardiance 6. PAF - Regular rate and rhythm. - On coumadin for anticoagulation 7. RV failure - Improved at last Echo.  8. Orthostatic hypotension - was previously on midodrine but BP improved and now off  denies dizziness. Denies syncope and presyncope.  9. Tobacco use - stressed need for smoking cessation  Stable from HF perspective. Can f/u in HF Clinic PRN. Continue f/u with [facility].   DGlori Bickers MD 10:50 AM

## 2017-06-22 IMAGING — CR DG CHEST 1V PORT
1 series · 1 of 1 positions shown · non-contrast
Comparison: 03/19/2016

CLINICAL DATA: Status post coronary bypass grafting, chest pain

EXAM:
PORTABLE CHEST 1 VIEW

[AP]
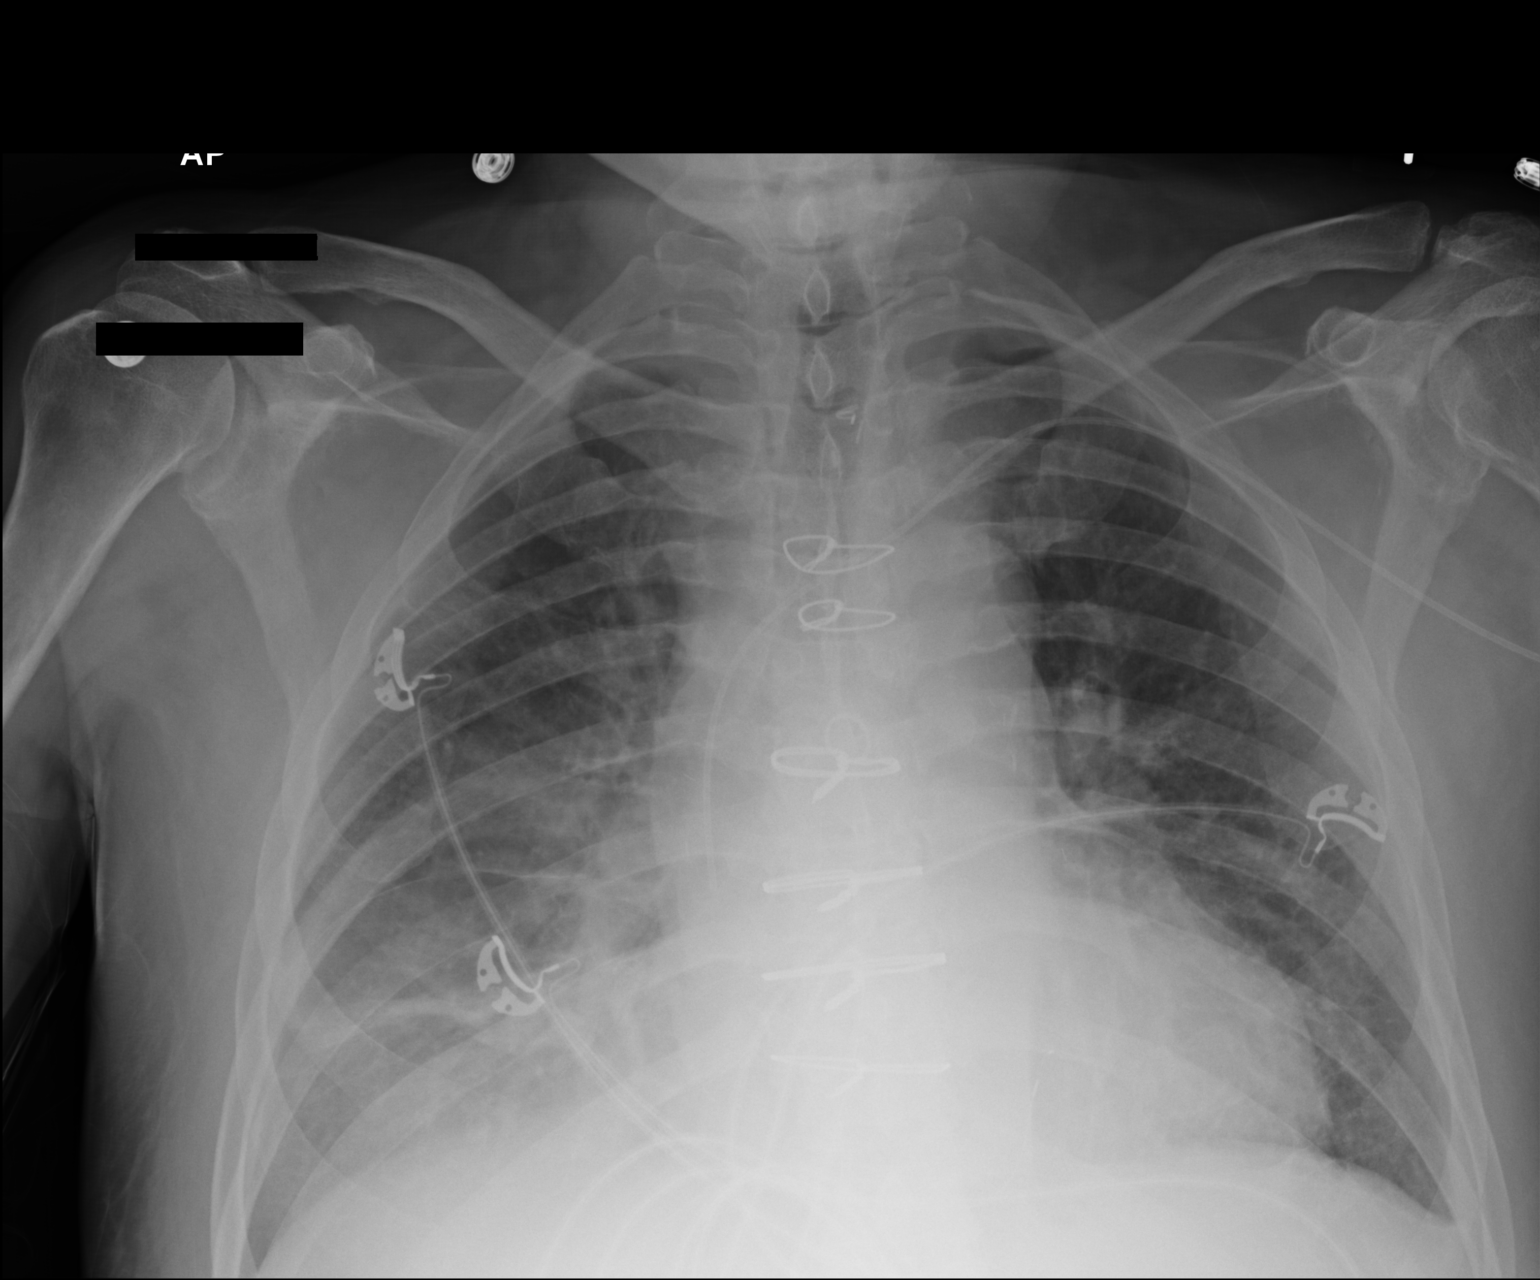

[1 of 1 positions shown; findings below may reference images not displayed]

FINDINGS: Cardiac shadow remains enlarged. A left PICC line is again noted in
satisfactory position. Stable vascular congestion is noted with
right-sided pleural effusion and right basilar atelectasis. No new
focal abnormality is seen.
IMPRESSION: No change from the previous exam.

## 2017-06-24 IMAGING — CR DG CHEST 1V PORT
1 series · 1 of 1 positions shown · non-contrast
Comparison: 03/20/2016

CLINICAL DATA: CABG 1 week ago

EXAM:
PORTABLE CHEST 1 VIEW

[AP]
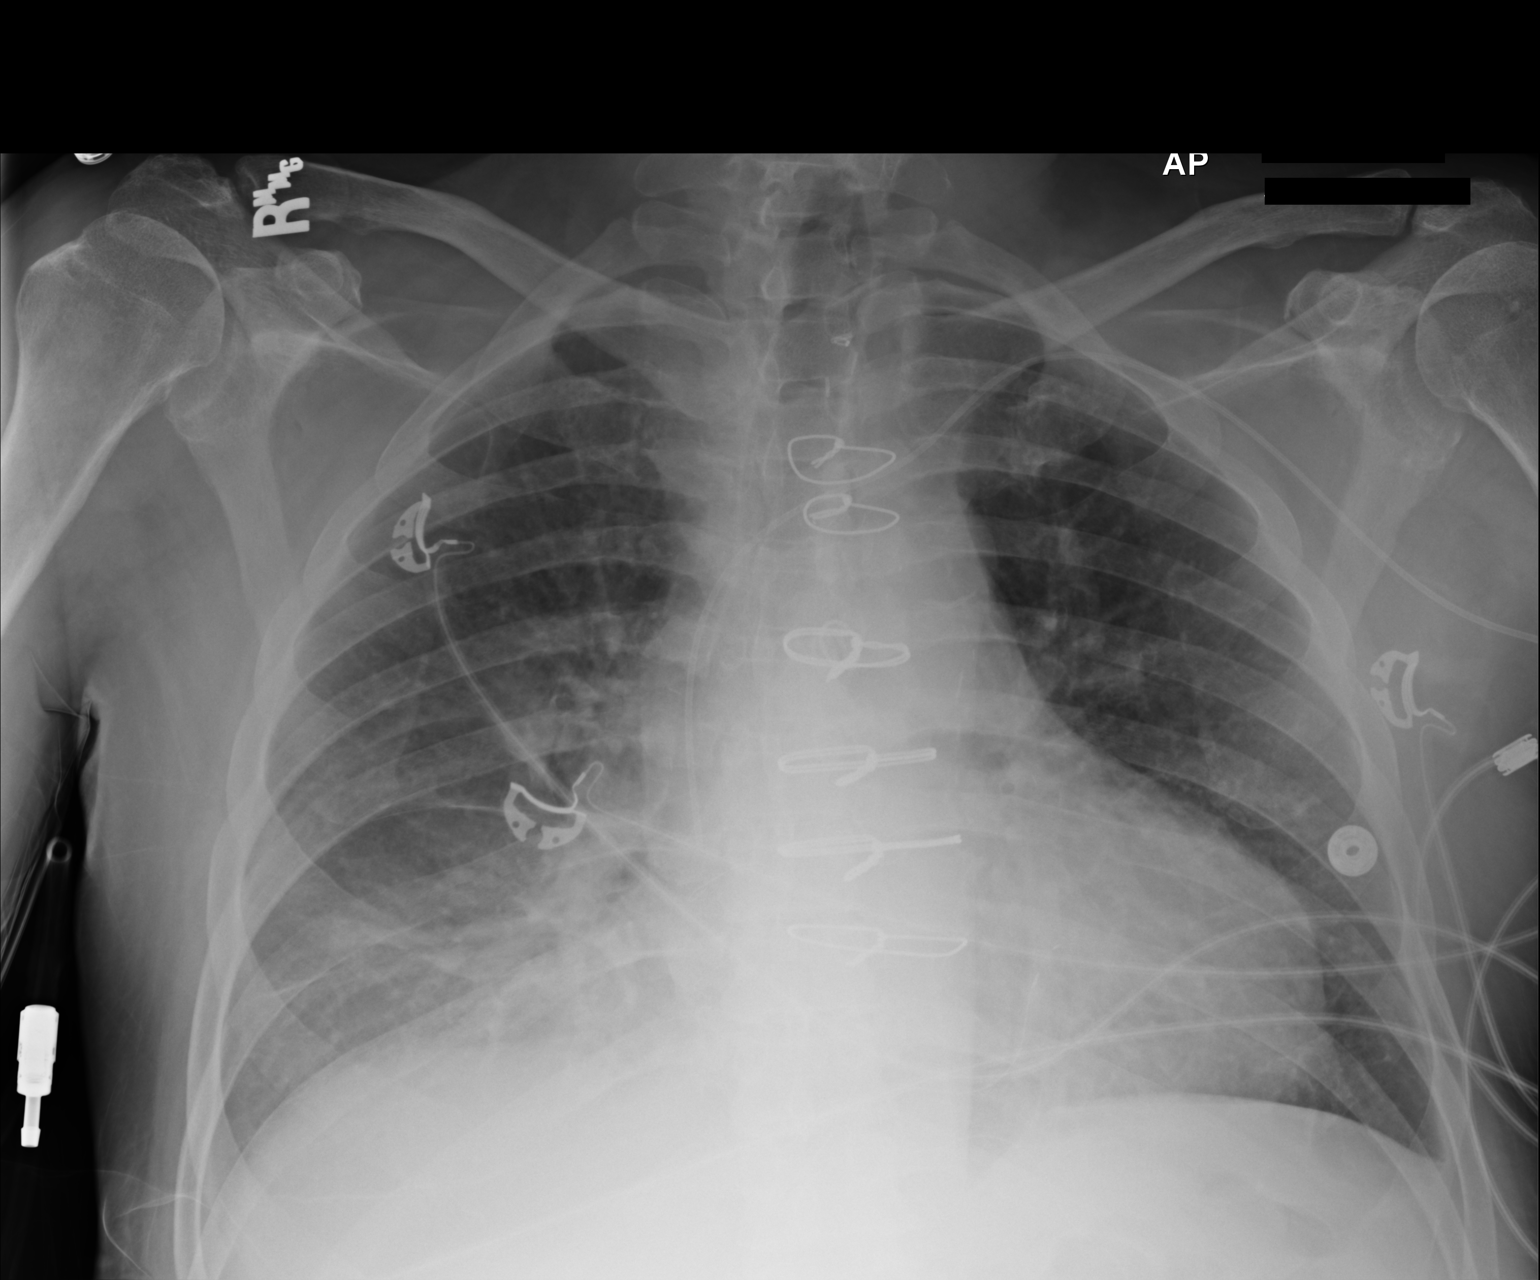

[1 of 1 positions shown; findings below may reference images not displayed]

FINDINGS: Upper normal heart size. Left upper extremity PICC is stable.
Bibasilar hazy airspace disease has improved. It remains right
greater than left. Tiny left pleural effusion unchanged. No
pneumothorax. Stable mild vascular congestion. No sign of
interstitial edema.
IMPRESSION: Improved bibasilar airspace disease.  Stable tiny left effusion.

## 2017-07-01 ENCOUNTER — Ambulatory Visit (INDEPENDENT_AMBULATORY_CARE_PROVIDER_SITE_OTHER): Payer: Medicare Other | Admitting: Pharmacist

## 2017-07-01 DIAGNOSIS — Z5181 Encounter for therapeutic drug level monitoring: Secondary | ICD-10-CM | POA: Diagnosis not present

## 2017-07-01 DIAGNOSIS — I480 Paroxysmal atrial fibrillation: Secondary | ICD-10-CM

## 2017-07-01 LAB — POCT INR: INR: 2

## 2017-07-01 MED ORDER — NICOTINE 21 MG/24HR TD PT24: 21.0000 mg | MEDICATED_PATCH | Freq: Every day | TRANSDERMAL | 0 refills | Status: DC

## 2017-07-19 IMAGING — CR DG CHEST 2V
2 series · 2 of 2 positions shown · non-contrast
Comparison: 03/22/2016

CLINICAL DATA: History of CABG

EXAM:
CHEST  2 VIEW

[w chest lat *]
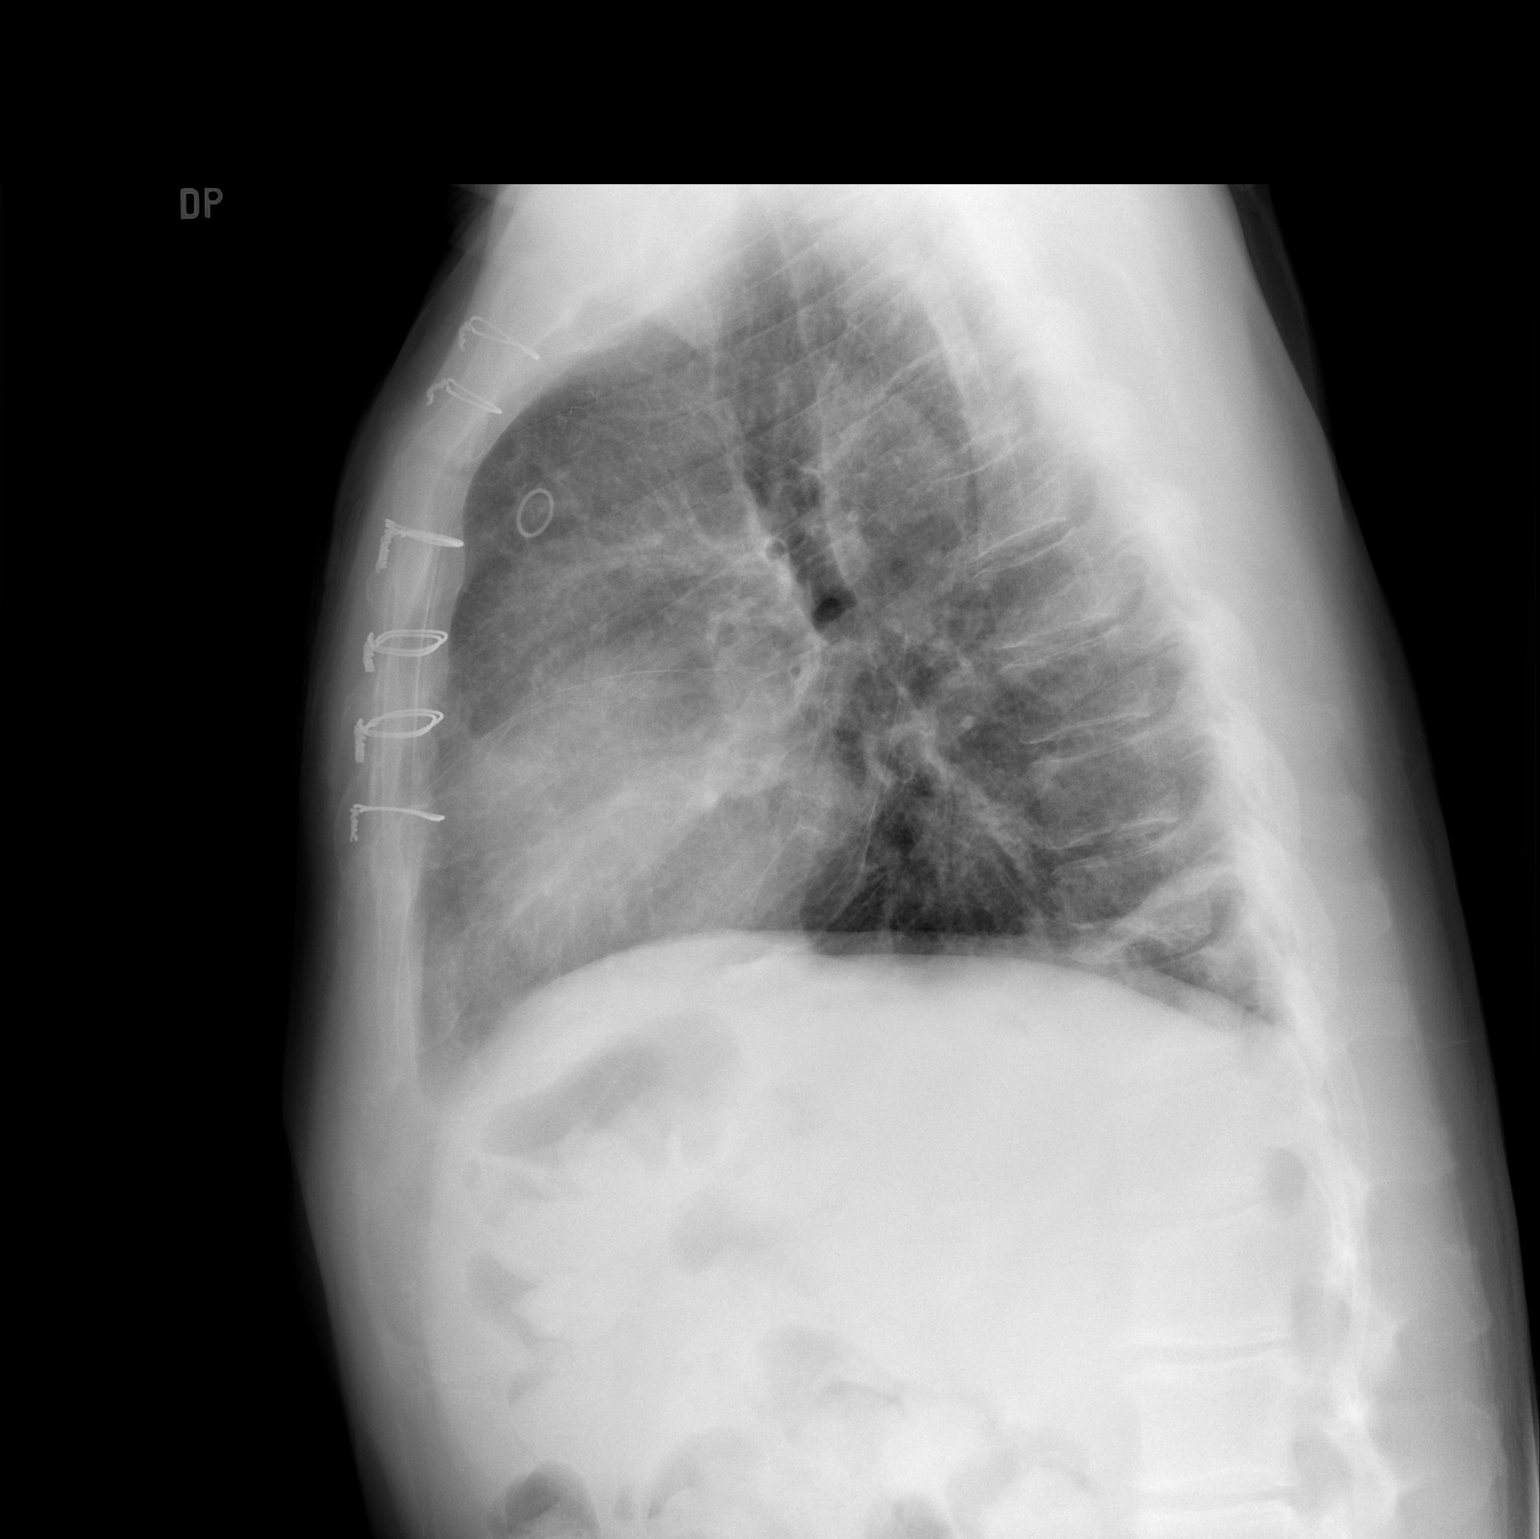

[w chest ap *]
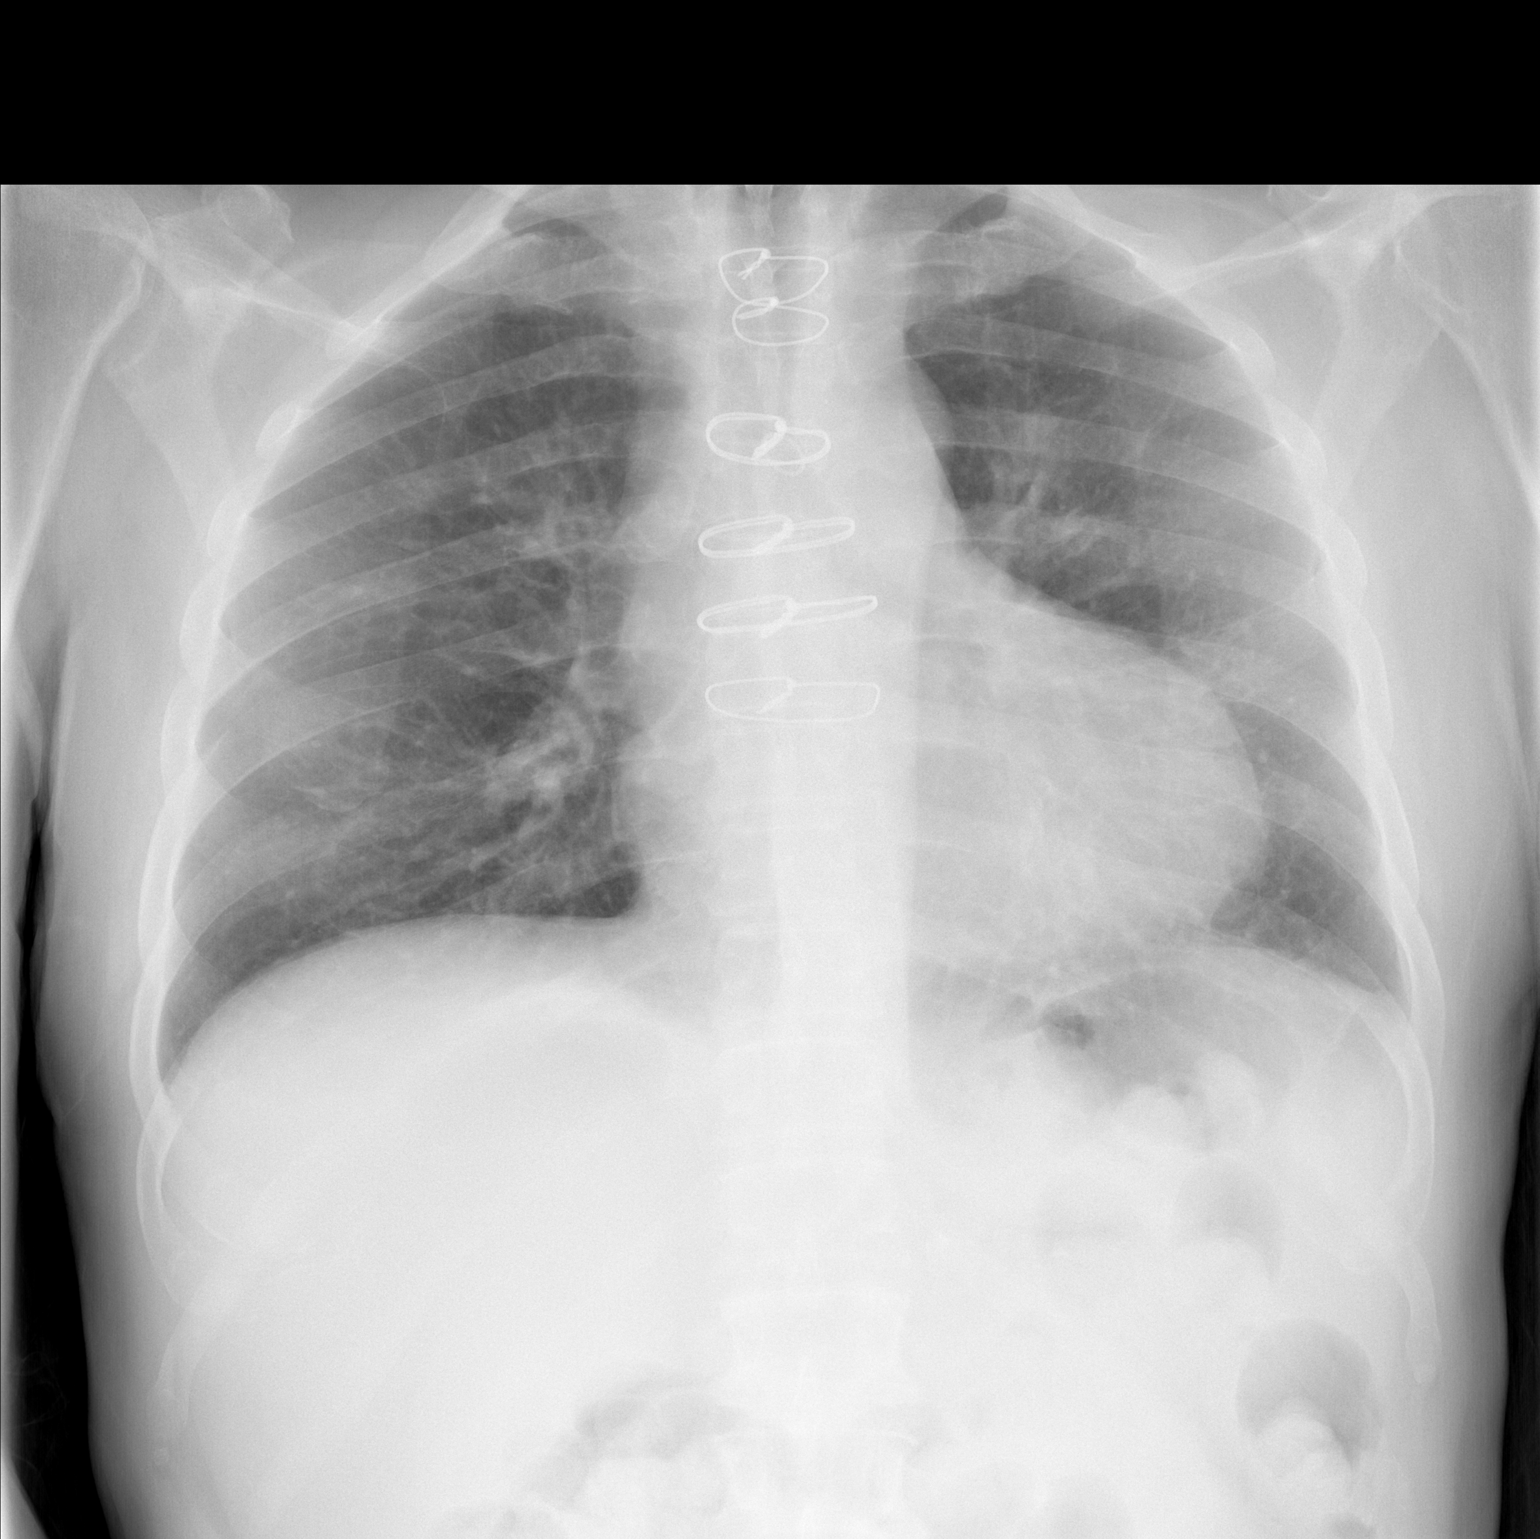

[2 of 2 positions shown; findings below may reference images not displayed]

FINDINGS: Cardiomediastinal silhouette is stable. Status post CABG. No acute
infiltrate or pleural effusion. No pulmonary edema. Mild left
basilar atelectasis or scarring
IMPRESSION: No infiltrate or pulmonary edema. Left base atelectasis or scarring.

## 2017-07-29 ENCOUNTER — Ambulatory Visit: Payer: Medicare Other | Admitting: Family Medicine

## 2017-08-01 ENCOUNTER — Ambulatory Visit: Payer: Medicare Other | Admitting: Family Medicine

## 2017-08-21 ENCOUNTER — Ambulatory Visit: Payer: Medicare Other | Admitting: Family Medicine

## 2017-08-24 ENCOUNTER — Other Ambulatory Visit (HOSPITAL_COMMUNITY): Payer: Self-pay | Admitting: Internal Medicine

## 2017-08-26 ENCOUNTER — Ambulatory Visit (INDEPENDENT_AMBULATORY_CARE_PROVIDER_SITE_OTHER): Payer: Medicare Other | Admitting: Family Medicine

## 2017-08-26 ENCOUNTER — Encounter: Payer: Self-pay | Admitting: Family Medicine

## 2017-08-26 VITALS — BP 96/80 | HR 72 | Temp 97.9°F | Resp 16 | Ht 65.0 in | Wt 177.0 lb

## 2017-08-26 DIAGNOSIS — J069 Acute upper respiratory infection, unspecified: Secondary | ICD-10-CM | POA: Diagnosis not present

## 2017-08-26 DIAGNOSIS — Z794 Long term (current) use of insulin: Secondary | ICD-10-CM

## 2017-08-26 DIAGNOSIS — N183 Chronic kidney disease, stage 3 unspecified: Secondary | ICD-10-CM

## 2017-08-26 DIAGNOSIS — G2581 Restless legs syndrome: Secondary | ICD-10-CM | POA: Diagnosis not present

## 2017-08-26 DIAGNOSIS — E1152 Type 2 diabetes mellitus with diabetic peripheral angiopathy with gangrene: Secondary | ICD-10-CM | POA: Diagnosis not present

## 2017-08-26 DIAGNOSIS — H6122 Impacted cerumen, left ear: Secondary | ICD-10-CM | POA: Diagnosis not present

## 2017-08-26 DIAGNOSIS — I10 Essential (primary) hypertension: Secondary | ICD-10-CM

## 2017-08-26 LAB — POCT GLYCOSYLATED HEMOGLOBIN (HGB A1C): Hemoglobin A1C: 9.7

## 2017-08-26 LAB — POCT URINALYSIS DIP (DEVICE)
Bilirubin Urine: NEGATIVE
Glucose, UA: NEGATIVE mg/dL
KETONES UR: NEGATIVE mg/dL
Leukocytes, UA: NEGATIVE
Nitrite: NEGATIVE
PROTEIN: NEGATIVE mg/dL
UROBILINOGEN UA: 0.2 mg/dL (ref 0.0–1.0)
pH: 5 (ref 5.0–8.0)

## 2017-08-26 LAB — GLUCOSE, CAPILLARY: Glucose-Capillary: 127 mg/dL — ABNORMAL HIGH (ref 65–99)

## 2017-08-26 MED ORDER — PRAMIPEXOLE DIHYDROCHLORIDE 0.125 MG PO TABS: 0.1250 mg | ORAL_TABLET | Freq: Every day | ORAL | 3 refills | Status: DC

## 2017-08-26 MED ORDER — INSULIN DETEMIR 100 UNIT/ML FLEXPEN: 55.0000 [IU] | PEN_INJECTOR | Freq: Every day | SUBCUTANEOUS | 11 refills | Status: DC

## 2017-08-26 MED ORDER — BENZONATATE 100 MG PO CAPS: 100.0000 mg | ORAL_CAPSULE | Freq: Three times a day (TID) | ORAL | 0 refills | Status: DC | PRN

## 2017-08-26 MED ORDER — GABAPENTIN 300 MG PO CAPS: ORAL_CAPSULE | ORAL | 3 refills | Status: DC

## 2017-08-26 NOTE — Progress Notes (Signed)
William [name], a 67 year old male with a history of uncontrolled type 2 diabetes mellitus, PAD, diabetic foot ulcer, and neuropathy presents accompanied by daughter William [name] for a follow up type 2 diabetes and refills.   He has not been following a low carbohydrate diet consistently. He says that diet mostly consists of canned foods that can be prepared quickly. His daughter periodically prepares healthy meals for the family. William [name] and his wife have been sharing a glucometer, so it is not beneficial to review results. He does not recall daily averages of blood sugars.    Diabetes  He presents for his follow-up diabetic visit. He has type 2 diabetes mellitus. His disease course has been fluctuating. Pertinent negatives for hypoglycemia include no headaches, hunger, mood changes, seizures, speech difficulty, sweats or tremors. Associated symptoms include foot paresthesias (S/P toe amputations). Pertinent negatives for diabetes include no chest pain, no polydipsia and no weakness. Pertinent negatives for hypoglycemia complications include no blackouts. Diabetic complications include PVD. Risk factors for coronary artery disease include dyslipidemia and hypertension. Current diabetic treatment includes insulin injections. He is compliant with treatment most of the time. He is following a diabetic diet. He has not had a previous visit with a dietitian. He never participates in exercise. An ACE inhibitor/angiotensin II receptor blocker is not being taken. He sees a podiatrist.Eye exam is current.  Hypertension  The problem is controlled. Associated symptoms include PND. Pertinent negatives include no chest pain, headaches, palpitations or sweats. The current treatment provides moderate improvement. Compliance problems include diet.  Hypertensive end-organ damage includes kidney disease, CAD/MI and PVD.   Past Medical History:  Diagnosis Date  . AKI (acute kidney injury) (William [name])    With STEMI in 2017  .  Chronic systolic CHF (congestive heart failure) (William [name])   . Diabetes mellitus without complication (William [name])   . Hyperlipidemia   . Hypertension   . Paroxysmal atrial fibrillation ([name])   . STEMI (ST elevation myocardial infarction) (William [name]) 2017   Social History   Socioeconomic History  . Marital status: Married    Spouse name: William [name]  . Number of children: Not on file  . Years of education: Not on file  . Highest education level: Not on file  Social Needs  . Financial resource strain: Not on file  . Food insecurity - worry: Not on file  . Food insecurity - inability: Not on file  . Transportation needs - medical: Not on file  . Transportation needs - non-medical: Not on file  Occupational History  . Not on file  Tobacco Use  . Smoking status: Current Every Day Smoker    Packs/day: 0.50    Types: Cigarettes    Last attempt to quit: 11/20/2015    Years since quitting: 1.7  . Smokeless tobacco: Never Used  Substance and Sexual Activity  . Alcohol use: No  . Drug use: No  . Sexual activity: Not on file  Other Topics Concern  . Not on file  Social History Narrative  . Not on file   Immunization History  Administered Date(s) Administered  . Influenza,inj,Quad PF,6+ Mos 03/23/2016, 03/20/2017  . Pneumococcal Conjugate-13 09/14/2016  . Tdap 09/14/2016   Allergies  Allergen Reactions  . Morphine And Related Shortness Of Breath and Other (See Comments)    UNSPECIFIED REACTION "Pt said it was too much"   . Latex Rash    Review of Systems  Eyes: Negative.   Respiratory: Positive for cough.   Cardiovascular: Positive for PND.  Negative for chest pain, palpitations and leg swelling.  Gastrointestinal: Negative.  Negative for abdominal pain.  Genitourinary: Negative.  Negative for dysuria.  Musculoskeletal: Positive for back pain.  Skin:       Generalized erythema and burning  Neurological: Negative for tingling, tremors, seizures, speech difficulty, weakness and headaches.   Endo/Heme/Allergies: Negative for polydipsia.  Psychiatric/Behavioral: Negative.   Physical Exam  Constitutional: He is well-developed, well-nourished, and in no distress.  HENT:  Head: Normocephalic and atraumatic.  Nose: Nose normal.  Mouth/Throat: Oropharynx is clear and moist.  Cerumen impaction  Cardiovascular: Normal rate, regular rhythm, normal heart sounds and intact distal pulses.  Pulmonary/Chest: Effort normal and breath sounds normal.  Abdominal: Soft. Bowel sounds are normal.  Musculoskeletal:       Lumbar back: He exhibits no swelling, no edema and no spasm.  Neurological: He is alert. He exhibits normal muscle tone.  Ambulates with the assistance of cane  Skin: Skin is warm and dry.  Psychiatric: Mood, memory, affect and judgment normal.    BP 96/80 (BP Location: Right Arm, Patient Position: Sitting, Cuff Size: Normal)   Pulse 72   Temp 97.9 F (36.6 C) (Oral)   Resp 16   Ht 5\' 5"  (1.651 m)   Wt 177 lb (80.3 kg)   SpO2 98%   BMI 29.45 kg/m  Plan  1. Type 2 diabetes mellitus with diabetic peripheral angiopathy and gangrene, with long-term current use of insulin ([name]) Hemoglobin A1c is 9.7, which is above goal.  Will increase Levemir to 55 units at bedtime. Also, discussed the importance of following a carbohydrate modify diet with patient and his daughter.  Refrain from eating canned or processed foods.  Also increase water intake throughout the day. - HgB Z3Y - Basic Metabolic Panel - Glucose, capillary - POCT urinalysis dip (device) - Insulin Detemir (LEVEMIR FLEXPEN) 100 UNIT/ML Pen; Inject 55 Units into the skin daily at 10 pm.  Dispense: 15 mL; Refill: 11 - gabapentin (NEURONTIN) 300 MG capsule; take 1 capsule by mouth three times a day if needed for NEUROPATHY PAIN  Dispense: 90 capsule; Refill: 3  2. Essential hypertension Blood pressure at goal on current medication regimen. - Continue medication, monitor blood pressure at home. Continue DASH diet.  Reminder to go to the ER if any CP, SOB, nausea, dizziness, severe HA, changes vision/speech, left arm numbness and tingling and jaw pain.    - POCT urinalysis dip (device)  3. CKD (chronic kidney disease) stage 3, GFR 30-59 ml/min ([name]) Will review renal functioning as results become available. No proteinuria present.  Moderate hematuria, will continue losartan 12.5 mg daily.  4. Restless leg syndrome - pramipexole (MIRAPEX) 0.125 MG tablet; Take 1 tablet (0.125 mg total) by mouth at bedtime.  Dispense: 30 tablet; Refill: 3  5. Upper respiratory tract infection, unspecified type - benzonatate (TESSALON PERLES) 100 MG capsule; Take 1 capsule (100 mg total) by mouth 3 (three) times daily as needed for cough.  Dispense: 30 capsule; Refill: 0  6. Impacted cerumen of left ear - Ear Lavage    RTC: 3 months for chronic conditions    Donia Pounds  MSN, FNP-C Patient [facility] [address] [place],  [phone]

## 2017-08-26 NOTE — Patient Instructions (Addendum)
Upper respiratory symptoms increase rest, handwashing and drink plenty of fluids.  Will start tessalon perles for coughing   Your hemoglobin a1C is 9.7, will increase Levemir to 55 units at bedtime Will continue Humalog per sliding scale for meal coverage Recommend a lowfat, low carbohydrate diet divided over 5-6 small meals, increase water intake to 6-8 glasses, and 150 minutes per week of cardiovascular exercise.

## 2017-08-27 LAB — BASIC METABOLIC PANEL
BUN/Creatinine Ratio: 21 (ref 10–24)
BUN: 35 mg/dL — ABNORMAL HIGH (ref 8–27)
CALCIUM: 10.1 mg/dL (ref 8.6–10.2)
CHLORIDE: 105 mmol/L (ref 96–106)
CO2: 20 mmol/L (ref 20–29)
Creatinine, Ser: 1.67 mg/dL — ABNORMAL HIGH (ref 0.76–1.27)
GFR calc non Af Amer: 42 mL/min/{1.73_m2} — ABNORMAL LOW (ref >59)
GFR, EST AFRICAN AMERICAN: 49 mL/min/{1.73_m2} — AB (ref >59)
GLUCOSE: 121 mg/dL — AB (ref 65–99)
POTASSIUM: 5.1 mmol/L (ref 3.5–5.2)
Sodium: 142 mmol/L (ref 134–144)

## 2017-08-28 ENCOUNTER — Other Ambulatory Visit: Payer: Self-pay | Admitting: Pharmacist Clinician (PhC)/ Clinical Pharmacy Specialist

## 2017-08-28 MED ORDER — WARFARIN SODIUM 5 MG PO TABS: ORAL_TABLET | ORAL | 0 refills | Status: DC

## 2017-09-02 ENCOUNTER — Ambulatory Visit (INDEPENDENT_AMBULATORY_CARE_PROVIDER_SITE_OTHER): Payer: Medicare Other | Admitting: Pharmacist

## 2017-09-02 DIAGNOSIS — Z5181 Encounter for therapeutic drug level monitoring: Secondary | ICD-10-CM | POA: Diagnosis not present

## 2017-09-02 DIAGNOSIS — I480 Paroxysmal atrial fibrillation: Secondary | ICD-10-CM

## 2017-09-02 LAB — POCT INR: INR: 3.3

## 2017-09-02 NOTE — Patient Instructions (Signed)
Description   No warfarin today then continue with 1.5 tablets daily except 2 tablets every Tuesday, repeat INR in 3 weeks.   Rx for nicotine patch 21mg  daily sent to prefer pharmacy. If able to afford, patient will call once 14mg  needed.

## 2017-09-03 ENCOUNTER — Telehealth: Payer: Self-pay | Admitting: *Deleted

## 2017-09-03 ENCOUNTER — Telehealth: Payer: Self-pay

## 2017-09-03 MED ORDER — WARFARIN SODIUM 5 MG PO TABS: ORAL_TABLET | ORAL | 0 refills | Status: DC

## 2017-09-03 NOTE — Telephone Encounter (Signed)
Rx sent 

## 2017-09-03 NOTE — Telephone Encounter (Signed)
-----   Message from Dorena Dew, [place] sent at 08/30/2017 12:28 PM EST ----- Regarding: lab results Please inform patient that creatinine is elevated (indicator of kidney functioning), will continue to monitor every 3 months. Remind patient of the importance of taking medications consistently and following a carbohydrate modified diet in order to achieve positive outcomes.   Thanks

## 2017-09-03 NOTE — Telephone Encounter (Signed)
Called and spoke with patient's daughter [name] ford) advised that creatinine is elevated and we will continue to monitor every 3 months. Reminded her to have patient take medications consistently every day and follow a low card diet to achieve positive outcomes. Thanks!

## 2017-09-22 IMAGING — DX DG FOOT COMPLETE 3+V*R*
3 series · 3 of 3 positions shown · non-contrast
Comparison: None.

CLINICAL DATA: Infected forefoot.

EXAM:
RIGHT FOOT COMPLETE - 3+ VIEW

[foot ap]
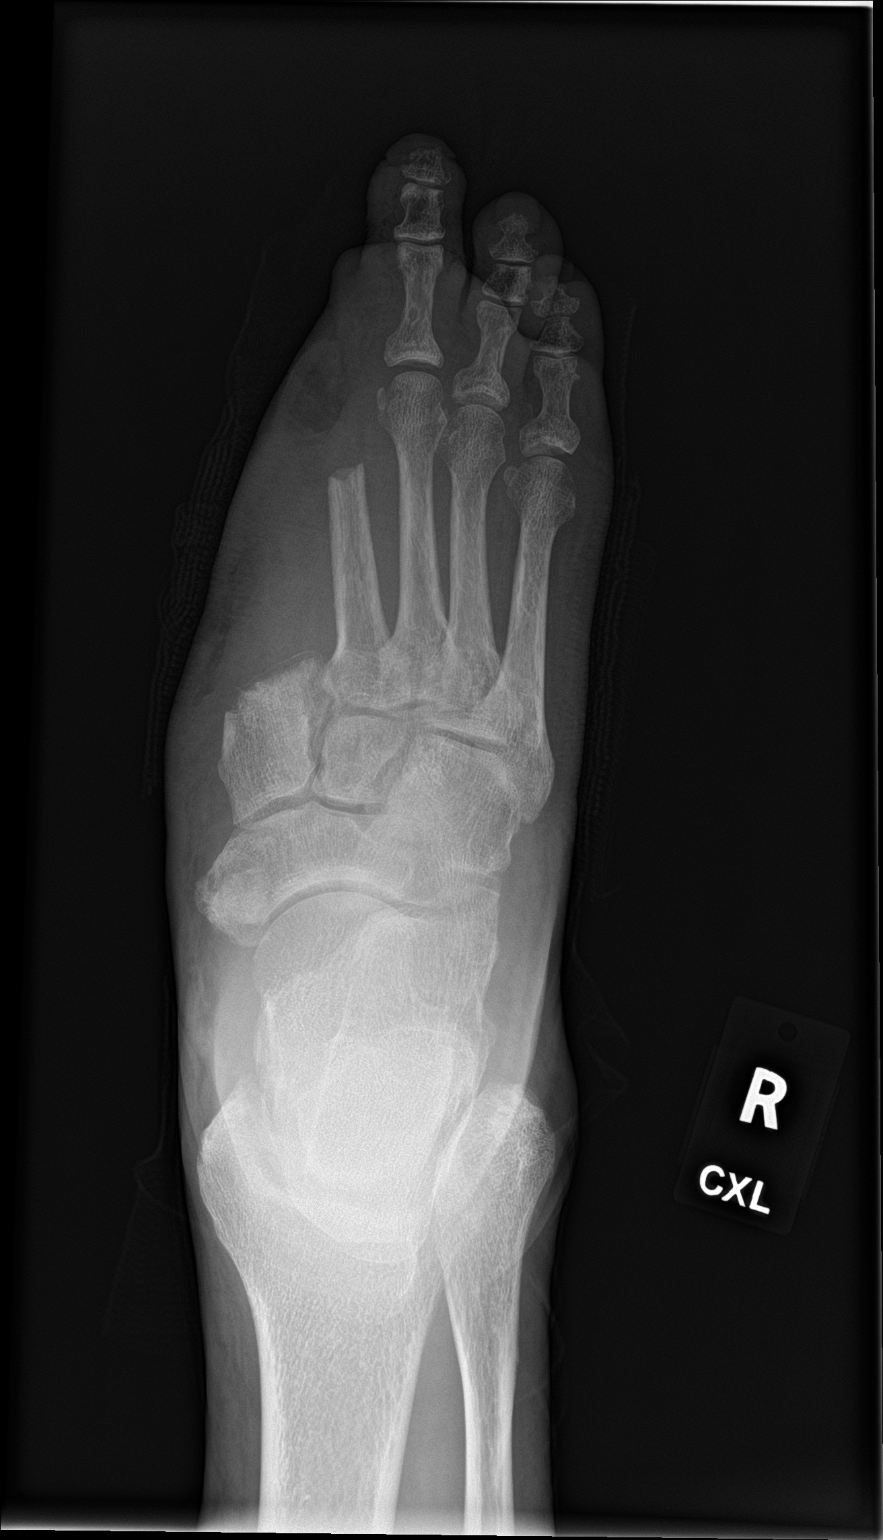

[foot obl]
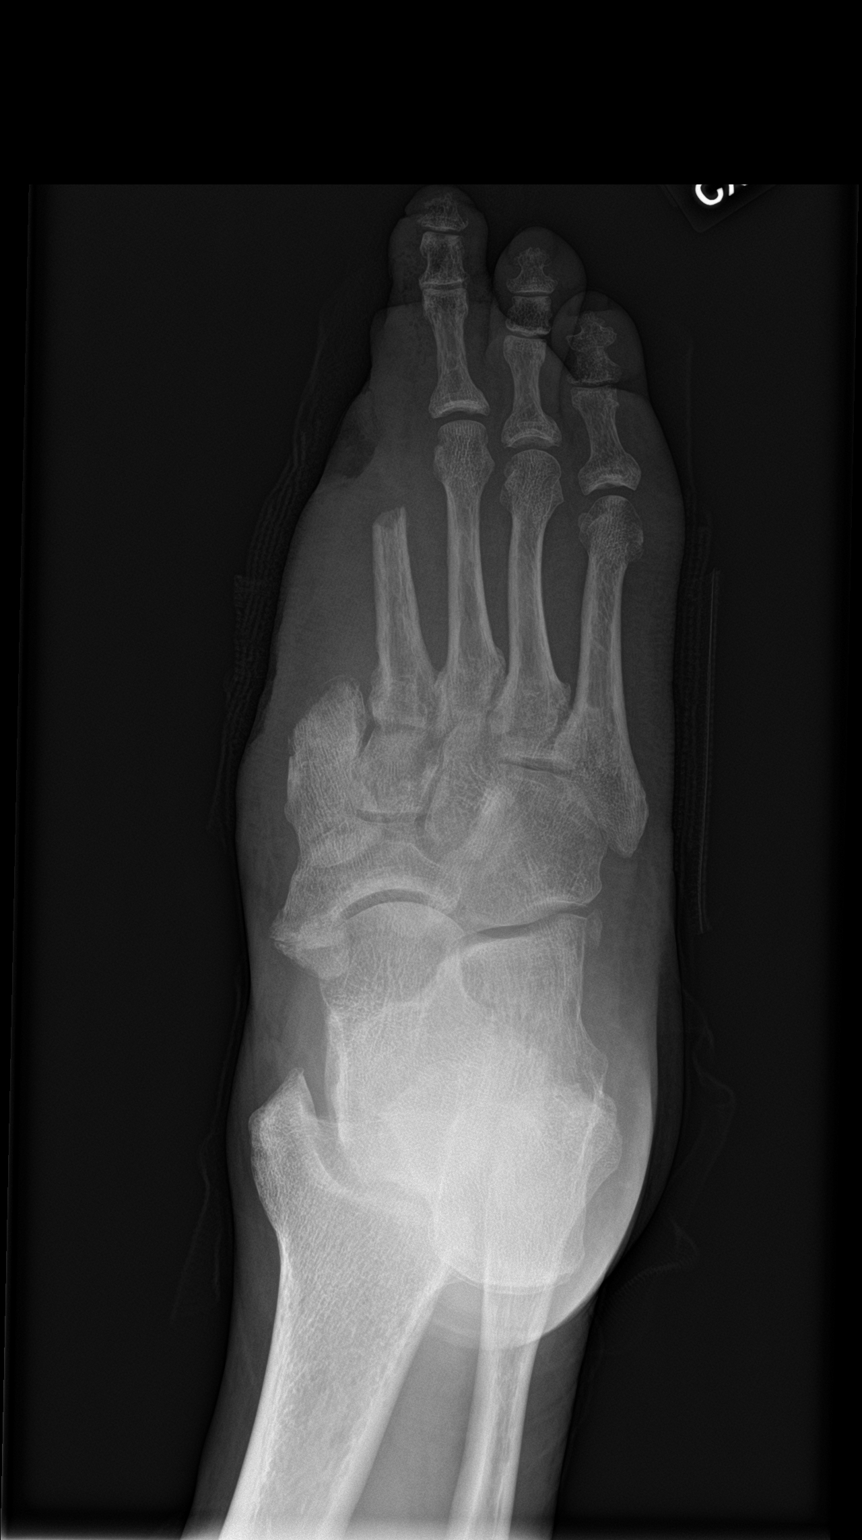

[foot lat]
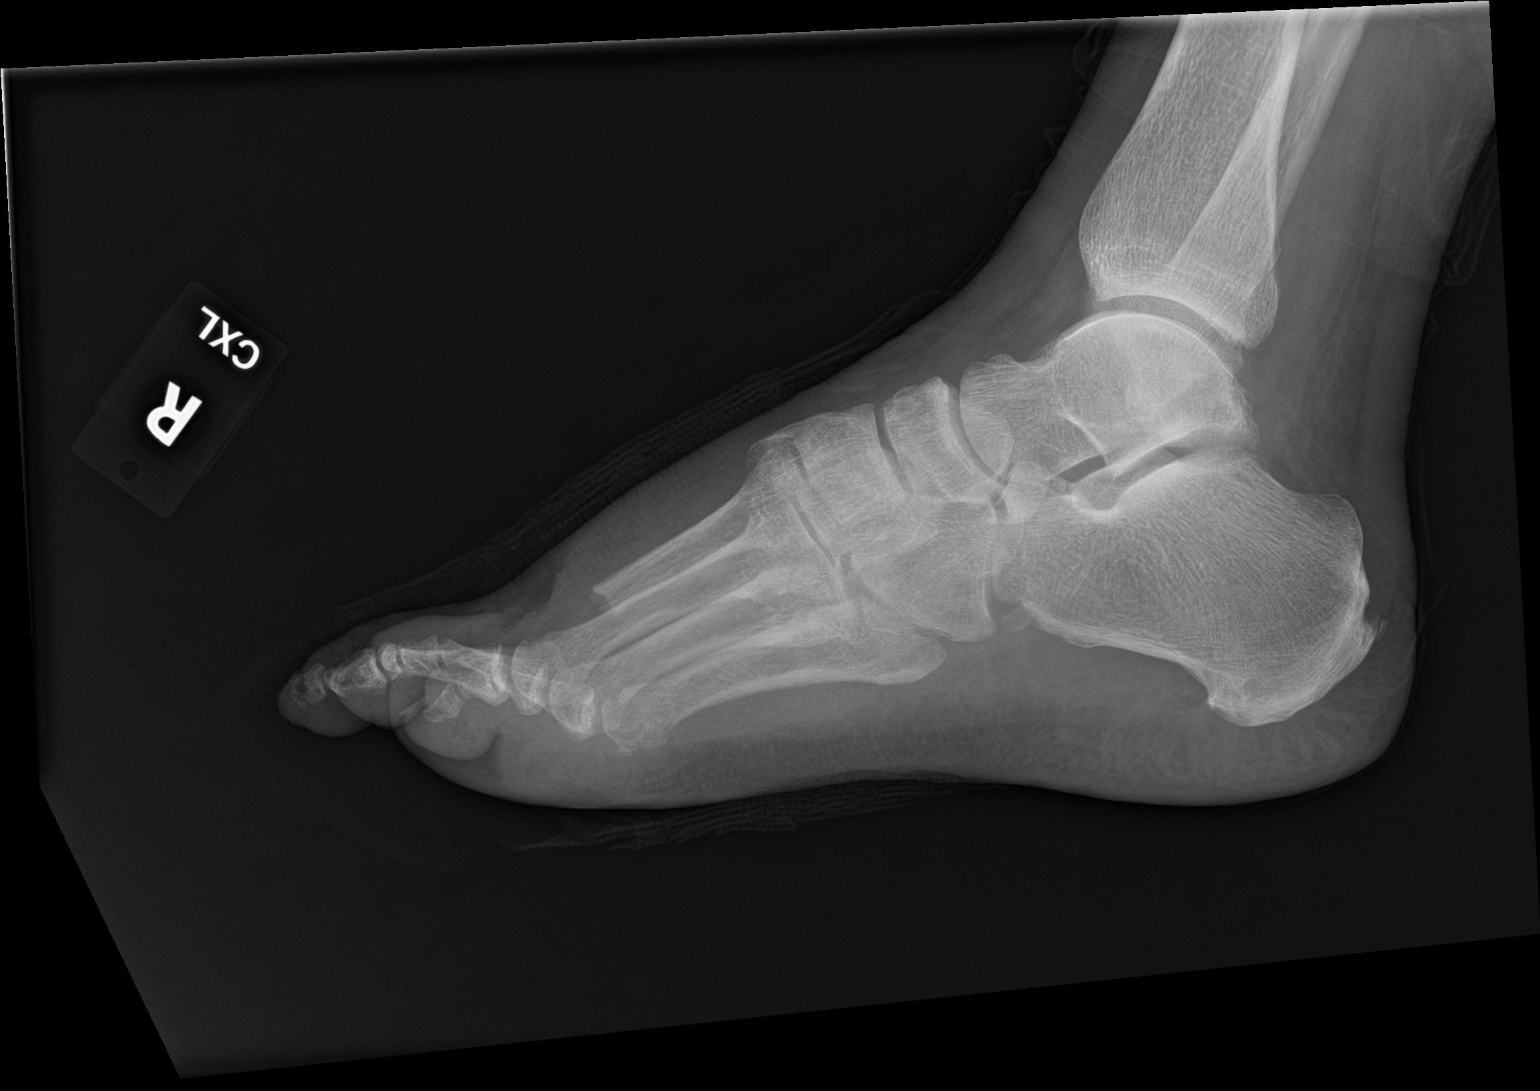

[3 of 3 positions shown; findings below may reference images not displayed]

FINDINGS: Amputations of the first and second rays at the level of the first
tarsometatarsal articulation and at the second distal metatarsal
shaft.

There is new bone loss at the distal tuft of the third distal
phalanx. There is soft tissue swelling and probable soft tissue gas
in the third phalanx. The findings are suspicious for osteomyelitis.
IMPRESSION: Probable osteomyelitis of the third digit.

## 2017-09-23 ENCOUNTER — Ambulatory Visit (INDEPENDENT_AMBULATORY_CARE_PROVIDER_SITE_OTHER): Payer: Medicare Other | Admitting: Pharmacist Clinician (PhC)/ Clinical Pharmacy Specialist

## 2017-09-23 ENCOUNTER — Other Ambulatory Visit: Payer: Self-pay | Admitting: Pharmacist Clinician (PhC)/ Clinical Pharmacy Specialist

## 2017-09-23 DIAGNOSIS — I480 Paroxysmal atrial fibrillation: Secondary | ICD-10-CM

## 2017-09-23 DIAGNOSIS — Z5181 Encounter for therapeutic drug level monitoring: Secondary | ICD-10-CM | POA: Diagnosis not present

## 2017-09-23 LAB — POCT INR: INR: 2.5

## 2017-09-23 MED ORDER — WARFARIN SODIUM 5 MG PO TABS
ORAL_TABLET | ORAL | 1 refills | Status: DC
Start: 2017-09-23 — End: 2017-12-09

## 2017-09-24 ENCOUNTER — Other Ambulatory Visit (HOSPITAL_COMMUNITY): Payer: Self-pay | Admitting: Internal Medicine

## 2017-10-15 ENCOUNTER — Emergency Department (HOSPITAL_COMMUNITY)
Admission: EM | Admit: 2017-10-15 | Discharge: 2017-10-15 | Disposition: A | Discharge status: Home / Self Care | Payer: Medicare Other | Attending: Emergency Medicine | Admitting: Emergency Medicine

## 2017-10-15 ENCOUNTER — Other Ambulatory Visit: Payer: Self-pay

## 2017-10-15 ENCOUNTER — Encounter (HOSPITAL_COMMUNITY): Payer: Self-pay | Admitting: *Deleted

## 2017-10-15 DIAGNOSIS — Z5321 Procedure and treatment not carried out due to patient leaving prior to being seen by health care provider: Secondary | ICD-10-CM | POA: Insufficient documentation

## 2017-10-15 DIAGNOSIS — E162 Hypoglycemia, unspecified: Secondary | ICD-10-CM | POA: Insufficient documentation

## 2017-10-15 LAB — CBG MONITORING, ED
GLUCOSE-CAPILLARY: 45 mg/dL — AB (ref 65–99)
GLUCOSE-CAPILLARY: 136 mg/dL — AB (ref 65–99)
Glucose-Capillary: 69 mg/dL (ref 65–99)

## 2017-10-15 NOTE — ED Notes (Signed)
Pt given peanut butter, graham crackers, and coke

## 2017-10-15 NOTE — ED Triage Notes (Signed)
Pt took 55units of levemir at bedtime and ate a pack of peanut butter crackers. Woke up feeling cool and clammy with dizziness. CBG at home was in the 70s, then dropped to 50s, pt ate bread. CBG 45 on arrival, orange juice with sugar given in triage

## 2017-10-15 NOTE — ED Notes (Signed)
After speaking to daughter, pt did not eat prior to taking insulin tonight. Pt has had orange juice with sugar, coke, peanut butter and graham crackers.

## 2017-10-15 NOTE — ED Notes (Signed)
Updated daughter on wait time, upset on wait time, wheeled pt out door to their truck

## 2017-10-21 ENCOUNTER — Ambulatory Visit (INDEPENDENT_AMBULATORY_CARE_PROVIDER_SITE_OTHER): Payer: Medicare Other | Admitting: Pharmacist

## 2017-10-21 DIAGNOSIS — Z5181 Encounter for therapeutic drug level monitoring: Secondary | ICD-10-CM | POA: Diagnosis not present

## 2017-10-21 DIAGNOSIS — I480 Paroxysmal atrial fibrillation: Secondary | ICD-10-CM

## 2017-10-21 LAB — POCT INR: INR: 3.2

## 2017-10-24 ENCOUNTER — Other Ambulatory Visit (HOSPITAL_COMMUNITY): Payer: Self-pay | Admitting: Internal Medicine

## 2017-10-31 ENCOUNTER — Other Ambulatory Visit: Payer: Self-pay

## 2017-10-31 DIAGNOSIS — E1152 Type 2 diabetes mellitus with diabetic peripheral angiopathy with gangrene: Secondary | ICD-10-CM

## 2017-10-31 DIAGNOSIS — Z794 Long term (current) use of insulin: Secondary | ICD-10-CM

## 2017-10-31 MED ORDER — GLUCOSE BLOOD VI STRP: ORAL_STRIP | 5 refills | Status: DC

## 2017-11-07 ENCOUNTER — Telehealth: Payer: Self-pay

## 2017-11-07 NOTE — Telephone Encounter (Signed)
Patient states that torsemide is on back order at the pharmacy and patient is out. The provider that prescribed this, he no longer sees. Please advised if you can give him something to replace this? He has a follow up with you scheduled for June 2. Thanks!

## 2017-11-11 ENCOUNTER — Ambulatory Visit (INDEPENDENT_AMBULATORY_CARE_PROVIDER_SITE_OTHER): Payer: Medicare Other | Admitting: Pharmacist

## 2017-11-11 DIAGNOSIS — Z5181 Encounter for therapeutic drug level monitoring: Secondary | ICD-10-CM

## 2017-11-11 DIAGNOSIS — I480 Paroxysmal atrial fibrillation: Secondary | ICD-10-CM | POA: Diagnosis not present

## 2017-11-11 LAB — POCT INR: INR: 2.6

## 2017-11-14 ENCOUNTER — Other Ambulatory Visit (HOSPITAL_COMMUNITY): Payer: Self-pay | Admitting: *Deleted

## 2017-11-14 MED ORDER — CARVEDILOL 3.125 MG PO TABS: 3.1250 mg | ORAL_TABLET | Freq: Two times a day (BID) | ORAL | 6 refills | Status: DC

## 2017-12-02 ENCOUNTER — Encounter: Payer: Self-pay | Admitting: Family Medicine

## 2017-12-02 ENCOUNTER — Ambulatory Visit (INDEPENDENT_AMBULATORY_CARE_PROVIDER_SITE_OTHER): Payer: Medicare Other | Admitting: Family Medicine

## 2017-12-02 VITALS — BP 112/59 | HR 81 | Temp 98.5°F | Resp 16 | Ht 65.0 in | Wt 180.0 lb

## 2017-12-02 DIAGNOSIS — Z23 Encounter for immunization: Secondary | ICD-10-CM

## 2017-12-02 DIAGNOSIS — I10 Essential (primary) hypertension: Secondary | ICD-10-CM | POA: Diagnosis not present

## 2017-12-02 DIAGNOSIS — Z794 Long term (current) use of insulin: Secondary | ICD-10-CM

## 2017-12-02 DIAGNOSIS — H9193 Unspecified hearing loss, bilateral: Secondary | ICD-10-CM | POA: Diagnosis not present

## 2017-12-02 DIAGNOSIS — K219 Gastro-esophageal reflux disease without esophagitis: Secondary | ICD-10-CM

## 2017-12-02 DIAGNOSIS — E1152 Type 2 diabetes mellitus with diabetic peripheral angiopathy with gangrene: Secondary | ICD-10-CM | POA: Diagnosis not present

## 2017-12-02 LAB — POCT URINALYSIS DIPSTICK
BILIRUBIN UA: NEGATIVE
GLUCOSE UA: POSITIVE — AB
Ketones, UA: NEGATIVE
Leukocytes, UA: NEGATIVE
Nitrite, UA: NEGATIVE
Protein, UA: NEGATIVE
Urobilinogen, UA: 0.2 E.U./dL
pH, UA: 5.5 (ref 5.0–8.0)

## 2017-12-02 LAB — GLUCOSE, POCT (MANUAL RESULT ENTRY): POC Glucose: 264 mg/dl — AB (ref 70–99)

## 2017-12-02 LAB — POCT GLYCOSYLATED HEMOGLOBIN (HGB A1C): HEMOGLOBIN A1C: 7.6 % — AB (ref 4.0–5.6)

## 2017-12-02 NOTE — Patient Instructions (Signed)
Hemoglobin A1c is 7.6, which is above goal.  Will continue medication at current dosage.  Continue to follow a carbohydrate modify diet divided over small meals.  Also, increase daily activity level. For worsening gastroesophageal reflux disease, I will send a referral to gastroenterology for further work-up and evaluation. Also, for decreased hearing or hearing loss will send a referral for possible hearing aids.  Food Choices for Gastroesophageal Reflux Disease, Adult When you have gastroesophageal reflux disease (GERD), the foods you eat and your eating habits are very important. Choosing the right foods can help ease your discomfort. What guidelines do I need to follow?  Choose fruits, vegetables, whole grains, and low-fat dairy products.  Choose low-fat meat, fish, and poultry.  Limit fats such as oils, salad dressings, butter, nuts, and avocado.  Keep a food diary. This helps you identify foods that cause symptoms.  Avoid foods that cause symptoms. These may be different for everyone.  Eat small meals often instead of 3 large meals a day.  Eat your meals slowly, in a place where you are relaxed.  Limit fried foods.  Cook foods using methods other than frying.  Avoid drinking alcohol.  Avoid drinking large amounts of liquids with your meals.  Avoid bending over or lying down until 2-3 hours after eating. What foods are not recommended? These are some foods and drinks that may make your symptoms worse: Vegetables Tomatoes. Tomato juice. Tomato and spaghetti sauce. Chili peppers. Onion and garlic. Horseradish. Fruits Oranges, grapefruit, and lemon (fruit and juice). Meats High-fat meats, fish, and poultry. This includes hot dogs, ribs, ham, sausage, salami, and bacon. Dairy Whole milk and chocolate milk. Sour cream. Cream. Butter. Ice cream. Cream cheese. Drinks Coffee and tea. Bubbly (carbonated) drinks or energy drinks. Condiments Hot sauce. Barbecue  sauce. Sweets/Desserts Chocolate and cocoa. Donuts. Peppermint and spearmint. Fats and Oils High-fat foods. This includes [place] fries and potato chips. Other Vinegar. Strong spices. This includes black pepper, white pepper, red pepper, cayenne, curry powder, cloves, ginger, and chili powder. The items listed above may not be a complete list of foods and drinks to avoid. Contact your dietitian for more information. This information is not intended to replace advice given to you by your health care provider. Make sure you discuss any questions you have with your health care provider. Document Released: 12/18/2011 Document Revised: 11/24/2015 Document Reviewed: 04/22/2013 Elsevier Interactive Patient Education  2017 [employer].

## 2017-12-02 NOTE — Progress Notes (Signed)
Subjective:    Patient ID: William [name], male    DOB: 05/25/1951, 67 y.o.   MRN: [id number]  HPI  William [name], a  67 year old male with a history of type 2 diabetes mellitus, hyperlipidemia, hypertension, status post myocardial infarction and chronic systolic heart failure presents for follow-up of chronic conditions.  He is taking all medications consistently.  He typically follows a carbohydrate modified diet, but does not exercise routinely.  He states that he has increased daily activity level over the past several months.  Patient is also complaining of worsening GERD.  He has been consistently taking Protonix 40 mg daily without sustained relief.  He states that symptoms of GERD are triggered by any foods or drinks including water.  Symptoms have been present over the past several months.  He denies dysphasia, hemoptysis, nausea, or vomiting.  He endorses early satiety, eructation, burning at the base of neck, and heartburn.  Patient denies weight loss.denies melena, hematochezia, hematemesis, and coffee ground emesis   Past Medical History:  Diagnosis Date  . AKI (acute kidney injury) ([place])    With STEMI in 2017  . Chronic systolic CHF (congestive heart failure) ([place])   . Diabetes mellitus without complication ([place])   . Hyperlipidemia   . Hypertension   . Paroxysmal atrial fibrillation ([name])   . STEMI (ST elevation myocardial infarction) ([place]) 2017   Social History   Socioeconomic History  . Marital status: Married    Spouse name: Lavell Luster  . Number of children: Not on file  . Years of education: Not on file  . Highest education level: Not on file  Occupational History  . Not on file  Social Needs  . Financial resource strain: Not on file  . Food insecurity:    Worry: Not on file    Inability: Not on file  . Transportation needs:    Medical: Not on file    Non-medical: Not on file  Tobacco Use  . Smoking status: Current Every Day Smoker    Packs/day: 0.50    Types:  Cigarettes    Last attempt to quit: 11/20/2015    Years since quitting: 2.0  . Smokeless tobacco: Never Used  Substance and Sexual Activity  . Alcohol use: No  . Drug use: No  . Sexual activity: Not on file  Lifestyle  . Physical activity:    Days per week: Not on file    Minutes per session: Not on file  . Stress: Not on file  Relationships  . Social connections:    Talks on phone: Not on file    Gets together: Not on file    Attends religious service: Not on file    Active member of club or organization: Not on file    Attends meetings of clubs or organizations: Not on file    Relationship status: Not on file  . Intimate partner violence:    Fear of current or ex partner: Not on file    Emotionally abused: Not on file    Physically abused: Not on file    Forced sexual activity: Not on file  Other Topics Concern  . Not on file  Social History Narrative  . Not on file   Immunization History  Administered Date(s) Administered  . Influenza,inj,Quad PF,6+ Mos 03/23/2016, 03/20/2017  . Pneumococcal Conjugate-13 09/14/2016  . Tdap 09/14/2016    Review of Systems  Constitutional: Negative.   HENT: Positive for hearing loss.   Eyes: Negative.   Respiratory: Negative.  Cardiovascular: Negative.   Gastrointestinal: Negative.   Endocrine: Negative.   Genitourinary: Negative.   Musculoskeletal: Negative.   Allergic/Immunologic: Negative.   Neurological: Negative.   Hematological: Negative.        Objective:   Physical Exam  Constitutional: He is oriented to person, place, and time. He appears well-developed and well-nourished.  HENT:  Head: Normocephalic.  Eyes: Pupils are equal, round, and reactive to light.  Neck: Normal range of motion. Neck supple.  Cardiovascular: Normal rate, regular rhythm and normal heart sounds.  Pulmonary/Chest: Effort normal.  Abdominal: Soft. Bowel sounds are normal.  Musculoskeletal: Normal range of motion.  Neurological: He is alert  and oriented to person, place, and time.  Skin: Skin is warm and dry.  Psychiatric: He has a normal mood and affect. His behavior is normal. Judgment and thought content normal.       BP (!) 112/59 (BP Location: Right Arm, Patient Position: Sitting, Cuff Size: Normal)   Pulse 81   Temp 98.5 F (36.9 C) (Oral)   Resp 16   Ht 5\' 5"  (1.651 m)   Wt 180 lb (81.6 kg)   SpO2 98%   BMI 29.95 kg/m  Assessment & Plan:  1. Type 2 diabetes mellitus with diabetic peripheral angiopathy and gangrene, with long-term current use of insulin ([name])  - HgB A1c - Glucose (CBG) - Comprehensive metabolic panel  2. Essential hypertension Blood pressure is at goal on current medication regimen, no medication changes warranted on today. - Continue medication, monitor blood pressure at home. Continue DASH diet. Reminder to go to the ER if any CP, SOB, nausea, dizziness, severe HA, changes vision/speech, left arm numbness and tingling and jaw pain.    - Urinalysis Dipstick  3. Gastroesophageal reflux disease without esophagitis Mr. Cletus Gash has worsening GERD.  GERD is not been controlled on Protonix 40 mg daily.  Patient's daughter prepares most meals, which have been small low-fat.  We will send a referral to gastroenterology for further work-up and evaluation  - Ambulatory referral to Gastroenterology  4. Decreased hearing of both ears Decreased hearing primarily to right ear - Ambulatory referral to Audiology  5. Immunization due - Pneumococcal polysaccharide vaccine 23-valent greater than or equal to 2yo subcutaneous/IM   RTC; 3 months for chronic conditions   Donia Pounds  MSN, FNP-C [facility] [name] [place], [place] [address] [phone]

## 2017-12-03 LAB — COMPREHENSIVE METABOLIC PANEL
ALT: 23 IU/L (ref 0–44)
AST: 17 IU/L (ref 0–40)
Albumin/Globulin Ratio: 1.9 (ref 1.2–2.2)
Albumin: 4.5 g/dL (ref 3.6–4.8)
Alkaline Phosphatase: 99 IU/L (ref 39–117)
BILIRUBIN TOTAL: 0.6 mg/dL (ref 0.0–1.2)
BUN/Creatinine Ratio: 20 (ref 10–24)
BUN: 34 mg/dL — ABNORMAL HIGH (ref 8–27)
CHLORIDE: 102 mmol/L (ref 96–106)
CO2: 21 mmol/L (ref 20–29)
Calcium: 9.9 mg/dL (ref 8.6–10.2)
Creatinine, Ser: 1.74 mg/dL — ABNORMAL HIGH (ref 0.76–1.27)
GFR calc non Af Amer: 40 mL/min/{1.73_m2} — ABNORMAL LOW (ref >59)
GFR, EST AFRICAN AMERICAN: 46 mL/min/{1.73_m2} — AB (ref >59)
GLUCOSE: 238 mg/dL — AB (ref 65–99)
Globulin, Total: 2.4 g/dL (ref 1.5–4.5)
Potassium: 4.9 mmol/L (ref 3.5–5.2)
Sodium: 139 mmol/L (ref 134–144)
TOTAL PROTEIN: 6.9 g/dL (ref 6.0–8.5)

## 2017-12-06 ENCOUNTER — Encounter: Payer: Self-pay | Admitting: Internal Medicine

## 2017-12-07 ENCOUNTER — Other Ambulatory Visit: Payer: Self-pay | Admitting: Family Medicine

## 2017-12-07 DIAGNOSIS — N183 Chronic kidney disease, stage 3 unspecified: Secondary | ICD-10-CM

## 2017-12-07 NOTE — Progress Notes (Signed)
Orders Placed This Encounter  Procedures  . Ambulatory referral to Nephrology    Referral Priority:   Routine    Referral Type:   Consultation    Referral Reason:   Specialty Services Required    Requested Specialty:   Nephrology    Number of Visits Requested:   [place]  MSN, FNP-C [facility] [address] [place], [place] [address] [phone]

## 2017-12-09 ENCOUNTER — Other Ambulatory Visit: Payer: Self-pay | Admitting: Pharmacist Clinician (PhC)/ Clinical Pharmacy Specialist

## 2017-12-09 ENCOUNTER — Ambulatory Visit (INDEPENDENT_AMBULATORY_CARE_PROVIDER_SITE_OTHER): Payer: Medicare Other | Admitting: Pharmacist Clinician (PhC)/ Clinical Pharmacy Specialist

## 2017-12-09 DIAGNOSIS — Z7901 Long term (current) use of anticoagulants: Secondary | ICD-10-CM

## 2017-12-09 DIAGNOSIS — I480 Paroxysmal atrial fibrillation: Secondary | ICD-10-CM | POA: Diagnosis not present

## 2017-12-09 DIAGNOSIS — Z5181 Encounter for therapeutic drug level monitoring: Secondary | ICD-10-CM

## 2017-12-09 LAB — POCT INR: INR: 2.8 (ref 2.0–3.0)

## 2017-12-09 MED ORDER — NITROGLYCERIN 0.4 MG/HR TD PT24: 0.4000 mg | MEDICATED_PATCH | Freq: Every day | TRANSDERMAL | 3 refills | Status: DC

## 2017-12-09 MED ORDER — WARFARIN SODIUM 5 MG PO TABS: ORAL_TABLET | ORAL | 0 refills | Status: DC

## 2018-01-01 ENCOUNTER — Other Ambulatory Visit: Payer: Self-pay | Admitting: Family Medicine

## 2018-01-01 DIAGNOSIS — Z794 Long term (current) use of insulin: Principal | ICD-10-CM

## 2018-01-01 DIAGNOSIS — E1152 Type 2 diabetes mellitus with diabetic peripheral angiopathy with gangrene: Secondary | ICD-10-CM

## 2018-01-03 ENCOUNTER — Other Ambulatory Visit: Payer: Self-pay | Admitting: Family Medicine

## 2018-01-03 DIAGNOSIS — G2581 Restless legs syndrome: Secondary | ICD-10-CM

## 2018-01-30 ENCOUNTER — Other Ambulatory Visit: Payer: Self-pay | Admitting: Family Medicine

## 2018-01-30 DIAGNOSIS — E1152 Type 2 diabetes mellitus with diabetic peripheral angiopathy with gangrene: Secondary | ICD-10-CM

## 2018-01-30 DIAGNOSIS — Z794 Long term (current) use of insulin: Principal | ICD-10-CM

## 2018-02-04 ENCOUNTER — Other Ambulatory Visit (HOSPITAL_COMMUNITY): Payer: Self-pay

## 2018-02-04 MED ORDER — ROSUVASTATIN CALCIUM 40 MG PO TABS: 40.0000 mg | ORAL_TABLET | Freq: Every day | ORAL | 0 refills | Status: DC

## 2018-02-10 ENCOUNTER — Encounter: Payer: Self-pay | Admitting: Internal Medicine

## 2018-02-10 ENCOUNTER — Other Ambulatory Visit: Payer: Self-pay

## 2018-02-10 ENCOUNTER — Telehealth: Payer: Self-pay

## 2018-02-10 ENCOUNTER — Ambulatory Visit (INDEPENDENT_AMBULATORY_CARE_PROVIDER_SITE_OTHER): Payer: Medicare Other | Admitting: Internal Medicine

## 2018-02-10 VITALS — BP 118/58 | HR 78 | Ht 65.0 in | Wt 181.0 lb

## 2018-02-10 DIAGNOSIS — K5909 Other constipation: Secondary | ICD-10-CM | POA: Diagnosis not present

## 2018-02-10 DIAGNOSIS — IMO0002 Reserved for concepts with insufficient information to code with codable children: Secondary | ICD-10-CM

## 2018-02-10 DIAGNOSIS — Z794 Long term (current) use of insulin: Secondary | ICD-10-CM

## 2018-02-10 DIAGNOSIS — K219 Gastro-esophageal reflux disease without esophagitis: Secondary | ICD-10-CM | POA: Diagnosis not present

## 2018-02-10 DIAGNOSIS — G2581 Restless legs syndrome: Secondary | ICD-10-CM

## 2018-02-10 DIAGNOSIS — K625 Hemorrhage of anus and rectum: Secondary | ICD-10-CM | POA: Diagnosis not present

## 2018-02-10 DIAGNOSIS — Z7901 Long term (current) use of anticoagulants: Secondary | ICD-10-CM

## 2018-02-10 DIAGNOSIS — R1013 Epigastric pain: Secondary | ICD-10-CM

## 2018-02-10 DIAGNOSIS — I482 Chronic atrial fibrillation, unspecified: Secondary | ICD-10-CM

## 2018-02-10 DIAGNOSIS — E1165 Type 2 diabetes mellitus with hyperglycemia: Secondary | ICD-10-CM

## 2018-02-10 MED ORDER — PRAMIPEXOLE DIHYDROCHLORIDE 0.125 MG PO TABS: 0.1250 mg | ORAL_TABLET | Freq: Every day | ORAL | 0 refills | Status: DC

## 2018-02-10 MED ORDER — PANTOPRAZOLE SODIUM 40 MG PO TBEC: 40.0000 mg | DELAYED_RELEASE_TABLET | Freq: Two times a day (BID) | ORAL | 5 refills | Status: DC

## 2018-02-10 NOTE — Patient Instructions (Signed)
Normal BMI (Body Mass Index- based on height and weight) is between 23 and 30. Your BMI today is Body mass index is 30.12 kg/m. [name] Please consider follow up  regarding your BMI with your Primary Care Provider.  You have been scheduled for an endoscopy and colonoscopy. Please follow the written instructions given to you at your visit today. Please pick up your prep supplies at the pharmacy. If you use inhalers (even only as needed), please bring them with you on the day of your procedure.  We have sent the following medications to your pharmacy for you to pick up at your convenience: Pantoprazole , take one twice daily 30 minutes before.   You will be contaced by our office prior to your procedure for directions on holding your Coumadin/Warfarin.  If you do not hear from our office 1 week prior to your scheduled procedure, please call [phone] to discuss.   Please take Miralax daily.   I appreciate the opportunity to care for you. Silvano Rusk, MD, [facility]

## 2018-02-10 NOTE — Telephone Encounter (Signed)
[facility] Pre-operative Risk Assessment     Request for surgical clearance:     Endoscopy Procedure  What type of surgery is being performed?     colonoscopy  When is this surgery scheduled?     04/07/2018  What type of clearance is required ?   Pharmacy  Are there any medications that need to be held prior to surgery and how long? Warfarin , 5 days  Practice name and name of physician performing surgery?      [facility]  What is your office phone and fax number?      Phone- [phone]  [phone]  Anesthesia type (None, local, MAC, general) ?       MAC

## 2018-02-10 NOTE — Progress Notes (Signed)
William [name] 67 y.o. [birth year] [id number]  Assessment & Plan:   Encounter Diagnoses  Name Primary?  . Gastroesophageal reflux disease, esophagitis presence not specified Yes  . Abdominal pain, epigastric   . Rectal bleeding   . Chronic constipation   . Chronic atrial fibrillation ([place])   . Warfarin anticoagulation   . Uncontrolled type 2 diabetes mellitus with insulin therapy ([place])     Patient appears to have GERD, could be somewhat exacerbated by diabetes mellitus which is uncontrolled.  He needs PPI therapy.  He has he does have chronic kidney disease and there are associations with PPI and kidney problems but the reality of it is the study show weak associations at best.  This man has signs and symptoms of severe GERD so should be treated.  We will treat with   Meds ordered this encounter  Medications  . pantoprazole (PROTONIX) 40 MG tablet    Sig: Take 1 tablet (40 mg total) by mouth 2 (two) times daily before a meal. 30 mins before breakfast and supper    Dispense:  60 tablet    Refill:  5   MiraLAX daily for constipation.  Evaluate signs and symptoms with an EGD and colonoscopy.  He will need to hold his warfarin prior to the procedure, I do not think he is at extra high risk of stroke with his overall situation though there is a real but rare risk of stroke coming off the warfarin which I explained to the patient and his daughter.  This is an overall increased risk of his procedures however.  I believe his signs and symptoms warrant investigation.  Daughter became very emotional when she saw that we restrict creams and leafy vegetables in the colonoscopy prep.  I tried to explain to her as best I could that I think that the pharmacists had recommended that her father stay on a stable diet of a beefy green vegetables while on warfarin as to keep the INR stable.  I do not think restriction of his leafy green vegetables in the run up to the procedure while he is coming off  warfarin should have any deleterious effect.  The risks and benefits as well as alternatives of endoscopic procedure(s) have been discussed and reviewed. All questions answered. The patient agrees to proceed.  I appreciate the opportunity to care for this patient. CC: [name], FNP  Subjective:   Chief Complaint:  HPI The patient is a 67 year old white man here with his daughter with complaints of frequent heartburn and indigestion.  This was partially treated by PPI in the past, the PPI was stopped by primary care apparently because of concern over possible effects on the kidney since he has chronic kidney disease.  He does not have dysphagia.  He has a lot of bloating and flatulence and moves his bowels about every 2 to 3 days.  Intermittent MiraLAX may help but there is a delayed effect using it intermittently.  He is also had some intermittent rectal bleeding he says into the stool.  He takes warfarin for atrial fibrillation.  His daughter says he was told he must always keep regular greens went on this so he does not have a stroke.  His GI review of systems is otherwise notable for some epigastric abdominal pain associated with his heartburn and indigestion.  He has never had any type of endoscopic evaluation that we are aware of.  Wt Readings from Last 3 Encounters:  02/10/18 181 lb (82.1 kg)  12/02/17 180 lb (81.6 kg)  08/26/17 177 lb (80.3 kg)   Social history notable for disability he had a neck fracture in the 1990s, he moved here from [place] to live with his daughter in the early 2000's, and he does not read or write.  Allergies  Allergen Reactions  . Morphine And Related Shortness Of Breath and Other (See Comments)    UNSPECIFIED REACTION "Pt said it was too much"   . Latex Rash   Current Meds  Medication Sig  . acetaminophen (TYLENOL ARTHRITIS PAIN) 650 MG CR tablet Take 650 mg by mouth every 8 (eight) hours as needed for pain.  [name] albuterol (PROVENTIL  HFA;VENTOLIN HFA) 108 (90 Base) MCG/ACT inhaler Inhale 2 puffs into the lungs every 6 (six) hours as needed for wheezing or shortness of breath.  [name] aspirin EC 81 MG tablet Take 1 tablet (81 mg total) by mouth daily.  . Blood Glucose Monitoring Suppl (ACCU-CHEK AVIVA PLUS) w/Device KIT 1 each by Does not apply route 4 (four) times daily -  before meals and at bedtime.  . carvedilol (COREG) 3.125 MG tablet Take 1 tablet (3.125 mg total) by mouth 2 (two) times daily.  [name] gabapentin (NEURONTIN) 300 MG capsule TAKE 1 CAPSULE BY MOUTH THREE TIMES DAILY AS NEEDED FOR NEUROPATHY PAIN  . glucose blood (ACCU-CHEK AVIVA) test strip Check blood sugars three times per day prior to meals and bedtime  . Insulin Detemir (LEVEMIR FLEXPEN) 100 UNIT/ML Pen Inject 55 Units into the skin daily at 10 pm.  . insulin lispro (HUMALOG) 100 UNIT/ML KiwkPen Inject insulin subcutaneously according sliding scale  . Insulin Pen Needle 31G X 8 MM MISC 1 each by Does not apply route 4 (four) times daily.  . Lancets (ACCU-CHEK SOFT TOUCH) lancets Use as instructed  . losartan (COZAAR) 25 MG tablet TAKE 1/2 TABLET BY MOUTH ONCE DAILY  . nitroGLYCERIN (NITRODUR - DOSED IN MG/24 HR) 0.4 mg/hr patch Place 1 patch (0.4 mg total) onto the skin daily.  . pramipexole (MIRAPEX) 0.125 MG tablet Take 1 tablet (0.125 mg total) by mouth at bedtime.  . rosuvastatin (CRESTOR) 40 MG tablet Take 1 tablet (40 mg total) by mouth daily.  . silver sulfADIAZINE (SILVADENE) 1 % cream Apply 1 application topically daily. Apply to affected area daily plus dry dressing  . SSD 1 % cream Apply topically daily.  [name] torsemide (DEMADEX) 20 MG tablet Take 1 tablet (20 mg total) by mouth 3 (three) times a week.  . warfarin (COUMADIN) 5 MG tablet Take 1.5 to 2 tablets by mouth daily as directed by coumadin clinic   Past Medical History:  Diagnosis Date  . AKI (acute kidney injury) ([name])    With STEMI in 2017  . Chronic systolic CHF (congestive heart failure)  ([name])   . Diabetes mellitus without complication ([place])   . Hyperlipidemia   . Hypertension   . Paroxysmal atrial fibrillation ([name])   . STEMI (ST elevation myocardial infarction) ([place]) 2017   Past Surgical History:  Procedure Laterality Date  . ABDOMINAL AORTOGRAM W/LOWER EXTREMITY N/A 09/19/2016   Procedure: Abdominal Aortogram w/Lower Extremity;  Surgeon: Wellington Hampshire, MD;  Location: [facility];  Service: Cardiovascular;  Laterality: N/A;  . AMPUTATION Right 03/23/2016   Procedure: 1st and 2nd Ray Amputation Right Foot;  Surgeon: Newt Minion, MD;  Location: [facility];  Service: Orthopedics;  Laterality: Right;  . AMPUTATION Right 06/21/2016   Procedure: RIGHT TRANSMETATARSAL AMPUTATION;  Surgeon: Illene Regulus  Sharol Given, MD;  Location: [facility];  Service: Orthopedics;  Laterality: Right;  . CARDIAC CATHETERIZATION N/A 03/13/2016   Procedure: Right/Left Heart Cath and Coronary Angiography;  Surgeon: Sherren Mocha, MD;  Location: [facility];  Service: Cardiovascular;  Laterality: N/A;  . CARDIAC CATHETERIZATION N/A 03/13/2016   Procedure: IABP Insertion;  Surgeon: Sherren Mocha, MD;  Location: [facility];  Service: Cardiovascular;  Laterality: N/A;  . CATARACT EXTRACTION, BILATERAL    . [place]   has had 3 neck surgeries from breaking his neck  . CORONARY ARTERY BYPASS GRAFT N/A 03/13/2016   Procedure: CORONARY ARTERY BYPASS GRAFTING (CABG) x 1 (SVG to OM) with EVH from [place];  Surgeon: Ivin Poot, MD;  Location: [facility];  Service: Open Heart Surgery;  Laterality: N/A;  . LOWER EXTREMITY ANGIOGRAM  05/02/2016   Procedure: Lower Extremity Angiogram;  Surgeon: Wellington Hampshire, MD;  Location: [facility];  Service: Cardiovascular;;  Limited left femoral runoff right femoral runoff  . PERIPHERAL VASCULAR CATHETERIZATION N/A 05/02/2016   Procedure: Abdominal Aortogram;  Surgeon: Wellington Hampshire, MD;  Location: [facility];   Service: Cardiovascular;  Laterality: N/A;  . PERIPHERAL VASCULAR CATHETERIZATION Right 05/02/2016   Procedure: Peripheral Vascular Balloon Angioplasty;  Surgeon: Wellington Hampshire, MD;  Location: [facility];  Service: Cardiovascular;  Laterality: Right;  SFA  . TEE WITHOUT CARDIOVERSION N/A 03/13/2016   Procedure: TRANSESOPHAGEAL ECHOCARDIOGRAM (TEE);  Surgeon: Ivin Poot, MD;  Location: [facility];  Service: Open Heart Surgery;  Laterality: N/A;  . 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  He is disabled from a neck fracture in 1994.  1 son and 2 daughters.      He is illiterate.      No alcohol or caffeine or drug use or other tobacco he is a former smoker.   family history includes Aneurysm in his mother; Coronary artery disease in his mother; Diabetes in his maternal grandmother and mother; Peptic Ulcer in his father; Peripheral Artery Disease in his mother; Retinoblastoma in his daughter.   Review of Systems As per HPI Chronic back pain neck pain depressed mood all other review of systems are negative  Objective:   Physical Exam '@BP'  (!) 118/58   Pulse 78   Ht '5\' 5"'  (1.651 m)   Wt 181 lb (82.1 kg)   BMI 30.12 kg/m @  General:  Well-developed, well-nourished and in no acute distress Eyes:  anicteric. ENT:   Mouth and posterior pharynx free of lesions. dentures  Neck:   supple w/o thyromegaly or mass.  Lungs: Clear to auscultation bilaterally. Heart:  S1S2, no rubs, murmurs, gallops. Abdomen:  soft, non-tender, no hepatosplenomegaly, hernia, or mass and BS+.  Small LLQ SQ nodule Rectal: deferred Lymph:  no cervical or supraclavicular adenopathy. Extremities:   no edema,  cyanosis or clubbing Skin   no rash. c spine scar Neuro:  A&O x 3.  Psych:  appropriate mood and  Affect.   Data Reviewed: Primary care notes cardiology notes imaging labs in the EMR from this year Hemoglobin A1c 7.6% 2 months ago BUN 34  creatinine 1.74 estimated GFR 40 from 2 months ago

## 2018-02-11 NOTE — Telephone Encounter (Signed)
   Primary Cardiologist: Dr Haroldine Laws  Chart reviewed as part of pre-operative protocol coverage. Given past medical history and time since last visit, based on ACC/AHA guidelines, William [name] would be at acceptable risk for the planned procedure without further cardiovascular testing.   OK to hold Warfarin 5 days pre op.   I will route this recommendation to the requesting party via Epic fax function and remove from pre-op pool.  Please call with questions.  Kerin Ransom, PA-C 02/11/2018, 4:57 PM

## 2018-02-11 NOTE — Telephone Encounter (Signed)
Pt takes warfarin for afib with CHADS2VASc score of 4 (age, CHF, DM, CAD). HTN listed on PMH however pt has been more hypotensive than hypertensive over the past year. Ok to hold warfarin for 5 days prior to endoscopy.

## 2018-02-12 ENCOUNTER — Telehealth: Payer: Self-pay | Admitting: *Deleted

## 2018-02-12 NOTE — Telephone Encounter (Signed)
Called mobile # [phone] , trying to speak to the patient's daughter Con Memos.  Called twice today and got the message that this number is not accepting calls at this time. Tried the home number , [phone]. No answer in the morning and at 2:15 PM today.

## 2018-02-12 NOTE — Telephone Encounter (Signed)
Spoke to [employer] and advised her father can hold the Warfarin ( Coumadin) on 10-2 through 10-7.  Dr. Carlean Purl will advise her father when to restart the Coumadin.  Advised her we got this information from Dr. Clayborne Dana office. She thanked me for calling and letting them know.

## 2018-02-13 ENCOUNTER — Other Ambulatory Visit: Payer: Self-pay | Admitting: Nephrology

## 2018-02-13 DIAGNOSIS — N183 Chronic kidney disease, stage 3 unspecified: Secondary | ICD-10-CM

## 2018-02-14 ENCOUNTER — Encounter: Payer: Self-pay | Admitting: Internal Medicine

## 2018-02-17 ENCOUNTER — Ambulatory Visit (INDEPENDENT_AMBULATORY_CARE_PROVIDER_SITE_OTHER): Payer: Medicare Other | Admitting: Pharmacist Clinician (PhC)/ Clinical Pharmacy Specialist

## 2018-02-17 DIAGNOSIS — I480 Paroxysmal atrial fibrillation: Secondary | ICD-10-CM

## 2018-02-17 DIAGNOSIS — Z5181 Encounter for therapeutic drug level monitoring: Secondary | ICD-10-CM | POA: Diagnosis not present

## 2018-02-17 LAB — POCT INR: INR: 2.2 (ref 2.0–3.0)

## 2018-02-17 NOTE — Patient Instructions (Signed)
Description   Decrease dose to 1.5 tablets daily except for 1 tablet every Wedneday ,repeat INR in 4 weeks.  (Colonoscopy scheduled for 10/7)

## 2018-02-24 ENCOUNTER — Ambulatory Visit
Admission: RE | Admit: 2018-02-24 | Discharge: 2018-02-24 | Disposition: A | Discharge status: Home / Self Care | Payer: Medicare Other | Source: Ambulatory Visit | Attending: Nephrology | Admitting: Nephrology

## 2018-02-24 DIAGNOSIS — N183 Chronic kidney disease, stage 3 unspecified: Secondary | ICD-10-CM

## 2018-03-06 ENCOUNTER — Other Ambulatory Visit: Payer: Self-pay

## 2018-03-06 DIAGNOSIS — E1152 Type 2 diabetes mellitus with diabetic peripheral angiopathy with gangrene: Secondary | ICD-10-CM

## 2018-03-06 DIAGNOSIS — Z794 Long term (current) use of insulin: Secondary | ICD-10-CM

## 2018-03-06 MED ORDER — GABAPENTIN 300 MG PO CAPS: ORAL_CAPSULE | ORAL | 0 refills | Status: DC

## 2018-03-10 ENCOUNTER — Ambulatory Visit (INDEPENDENT_AMBULATORY_CARE_PROVIDER_SITE_OTHER): Payer: Medicare Other | Admitting: Family Medicine

## 2018-03-10 ENCOUNTER — Encounter: Payer: Self-pay | Admitting: Family Medicine

## 2018-03-10 ENCOUNTER — Other Ambulatory Visit: Payer: Self-pay

## 2018-03-10 ENCOUNTER — Other Ambulatory Visit: Payer: Self-pay | Admitting: Family Medicine

## 2018-03-10 VITALS — BP 107/62 | HR 75 | Temp 97.9°F | Resp 16 | Ht 65.0 in | Wt 181.0 lb

## 2018-03-10 DIAGNOSIS — E1152 Type 2 diabetes mellitus with diabetic peripheral angiopathy with gangrene: Secondary | ICD-10-CM | POA: Diagnosis not present

## 2018-03-10 DIAGNOSIS — Z794 Long term (current) use of insulin: Secondary | ICD-10-CM | POA: Diagnosis not present

## 2018-03-10 DIAGNOSIS — G2581 Restless legs syndrome: Secondary | ICD-10-CM

## 2018-03-10 DIAGNOSIS — I10 Essential (primary) hypertension: Secondary | ICD-10-CM | POA: Diagnosis not present

## 2018-03-10 DIAGNOSIS — E118 Type 2 diabetes mellitus with unspecified complications: Secondary | ICD-10-CM

## 2018-03-10 LAB — POCT GLYCOSYLATED HEMOGLOBIN (HGB A1C): Hemoglobin A1C: 8 % — AB (ref 4.0–5.6)

## 2018-03-10 LAB — POCT URINALYSIS DIPSTICK
Bilirubin, UA: NEGATIVE
Glucose, UA: POSITIVE — AB
Ketones, UA: NEGATIVE
Leukocytes, UA: NEGATIVE
Nitrite, UA: NEGATIVE
Protein, UA: NEGATIVE
Spec Grav, UA: 1.01 (ref 1.010–1.025)
Urobilinogen, UA: 0.2 E.U./dL
pH, UA: 5 (ref 5.0–8.0)

## 2018-03-10 LAB — POCT CBG (FASTING - GLUCOSE)-MANUAL ENTRY: Glucose Fasting, POC: 276 mg/dL — AB (ref 70–99)

## 2018-03-10 MED ORDER — PRAMIPEXOLE DIHYDROCHLORIDE 0.125 MG PO TABS: 0.1250 mg | ORAL_TABLET | Freq: Every day | ORAL | 0 refills | Status: DC

## 2018-03-10 MED ORDER — ACETAMINOPHEN-CODEINE #3 300-30 MG PO TABS: 1.0000 | ORAL_TABLET | Freq: Three times a day (TID) | ORAL | 0 refills | Status: DC | PRN

## 2018-03-10 MED ORDER — ACCU-CHEK AVIVA PLUS W/DEVICE KIT: 1.0000 | PACK | Freq: Three times a day (TID) | 0 refills | Status: DC

## 2018-03-10 NOTE — Patient Instructions (Signed)
Diabetes Mellitus and Nutrition When you have diabetes (diabetes mellitus), it is very important to have healthy eating habits because your blood sugar (glucose) levels are greatly affected by what you eat and drink. Eating healthy foods in the appropriate amounts, at about the same times every day, can help you:  Control your blood glucose.  Lower your risk of heart disease.  Improve your blood pressure.  Reach or maintain a healthy weight.  Every person with diabetes is different, and each person has different needs for a meal plan. Your health care provider may recommend that you work with a diet and nutrition specialist (dietitian) to make a meal plan that is best for you. Your meal plan may vary depending on factors such as:  The calories you need.  The medicines you take.  Your weight.  Your blood glucose, blood pressure, and cholesterol levels.  Your activity level.  Other health conditions you have, such as heart or kidney disease.  How do carbohydrates affect me? Carbohydrates affect your blood glucose level more than any other type of food. Eating carbohydrates naturally increases the amount of glucose in your blood. Carbohydrate counting is a method for keeping track of how many carbohydrates you eat. Counting carbohydrates is important to keep your blood glucose at a healthy level, especially if you use insulin or take certain oral diabetes medicines. It is important to know how many carbohydrates you can safely have in each meal. This is different for every person. Your dietitian can help you calculate how many carbohydrates you should have at each meal and for snack. Foods that contain carbohydrates include:  Bread, cereal, rice, pasta, and crackers.  Potatoes and corn.  Peas, beans, and lentils.  Milk and yogurt.  Fruit and juice.  Desserts, such as cakes, cookies, ice cream, and candy.  How does alcohol affect me? Alcohol can cause a sudden decrease in blood  glucose (hypoglycemia), especially if you use insulin or take certain oral diabetes medicines. Hypoglycemia can be a life-threatening condition. Symptoms of hypoglycemia (sleepiness, dizziness, and confusion) are similar to symptoms of having too much alcohol. If your health care provider says that alcohol is safe for you, follow these guidelines:  Limit alcohol intake to no more than 1 drink per day for nonpregnant women and 2 drinks per day for men. One drink equals 12 oz of beer, 5 oz of wine, or 1 oz of hard liquor.  Do not drink on an empty stomach.  Keep yourself hydrated with water, diet soda, or unsweetened iced tea.  Keep in mind that regular soda, juice, and other mixers may contain a lot of sugar and must be counted as carbohydrates.  What are tips for following this plan? Reading food labels  Start by checking the serving size on the label. The amount of calories, carbohydrates, fats, and other nutrients listed on the label are based on one serving of the food. Many foods contain more than one serving per package.  Check the total grams (g) of carbohydrates in one serving. You can calculate the number of servings of carbohydrates in one serving by dividing the total carbohydrates by 15. For example, if a food has 30 g of total carbohydrates, it would be equal to 2 servings of carbohydrates.  Check the number of grams (g) of saturated and trans fats in one serving. Choose foods that have low or no amount of these fats.  Check the number of milligrams (mg) of sodium in one serving. Most people   should limit total sodium intake to less than 2,300 mg per day.  Always check the nutrition information of foods labeled as "low-fat" or "nonfat". These foods may be higher in added sugar or refined carbohydrates and should be avoided.  Talk to your dietitian to identify your daily goals for nutrients listed on the label. Shopping  Avoid buying canned, premade, or processed foods. These  foods tend to be high in fat, sodium, and added sugar.  Shop around the outside edge of the grocery store. This includes fresh fruits and vegetables, bulk grains, fresh meats, and fresh dairy. Cooking  Use low-heat cooking methods, such as baking, instead of high-heat cooking methods like deep frying.  Cook using healthy oils, such as olive, canola, or sunflower oil.  Avoid cooking with butter, cream, or high-fat meats. Meal planning  Eat meals and snacks regularly, preferably at the same times every day. Avoid going long periods of time without eating.  Eat foods high in fiber, such as fresh fruits, vegetables, beans, and whole grains. Talk to your dietitian about how many servings of carbohydrates you can eat at each meal.  Eat 4-6 ounces of lean protein each day, such as lean meat, chicken, fish, eggs, or tofu. 1 ounce is equal to 1 ounce of meat, chicken, or fish, 1 egg, or 1/4 cup of tofu.  Eat some foods each day that contain healthy fats, such as avocado, nuts, seeds, and fish. Lifestyle   Check your blood glucose regularly.  Exercise at least 30 minutes 5 or more days each week, or as told by your health care provider.  Take medicines as told by your health care provider.  Do not use any products that contain nicotine or tobacco, such as cigarettes and e-cigarettes. If you need help quitting, ask your health care provider.  Work with a counselor or diabetes educator to identify strategies to manage stress and any emotional and social challenges. What are some questions to ask my health care provider?  Do I need to meet with a diabetes educator?  Do I need to meet with a dietitian?  What number can I call if I have questions?  When are the best times to check my blood glucose? Where to find more information:  American Diabetes Association: diabetes.org/food-and-fitness/food  Academy of Nutrition and Dietetics:  www.eatright.org/resources/health/diseases-and-conditions/diabetes  National Institute of Diabetes and Digestive and Kidney Diseases (NIH): www.niddk.nih.gov/health-information/diabetes/overview/diet-eating-physical-activity Summary  A healthy meal plan will help you control your blood glucose and maintain a healthy lifestyle.  Working with a diet and nutrition specialist (dietitian) can help you make a meal plan that is best for you.  Keep in mind that carbohydrates and alcohol have immediate effects on your blood glucose levels. It is important to count carbohydrates and to use alcohol carefully. This information is not intended to replace advice given to you by your health care provider. Make sure you discuss any questions you have with your health care provider. Document Released: 03/15/2005 Document Revised: 07/23/2016 Document Reviewed: 07/23/2016 Elsevier Interactive Patient Education  2018 Elsevier Inc.  

## 2018-03-10 NOTE — Progress Notes (Signed)
Patient [facility]  Chronic Disease Follow Up Provider: [name], FNP  SUBJECTIVE:  Patient presents for follow up for the following  chronic conditions.  Patient's daughter is the historian and the main caregiver. She states that he is scheduled to do a colonoscopy and EGD. Patient has been instructed to stop warfarin 5 days prior to procedure.  Patient states that he is in chronic pain of his low back. Daughter states that he has broken his neck 3 times. Has been taking Tylenol without relief.   Hypertension  Blood pressure is well controlled at home. Patient denies chest pain, dyspnea, fatigue, lower extremity edema and syncope. Medication compliance: Yes  Diabetes medication compliance: compliant all of the time  Hyperlipidemia Compliance with treatment has been good.  Patient denies muscle pain associated with his medications.  his is not exercising and is adherent to a low-salt, ADA, heart healthy or carbohydrate modified diet.   Review of Systems  Constitutional: Negative.   HENT: Negative.   Eyes: Negative.   Respiratory: Negative.   Cardiovascular: Negative.   Gastrointestinal: Negative.   Genitourinary: Negative.   Musculoskeletal: Negative.   Skin: Negative.   Neurological: Negative.   Psychiatric/Behavioral: Negative.     OBJECTIVE:  BP 107/62 (BP Location: Left Arm, Patient Position: Sitting, Cuff Size: Normal)   Pulse 75   Temp 97.9 F (36.6 C) (Oral)   Resp 16   Ht '5\' 5"'  (1.651 m)   Wt 181 lb (82.1 kg)   SpO2 98%   BMI 30.12 kg/m   Physical Exam  Constitutional: He is oriented to person, place, and time and well-developed, well-nourished, and in no distress. No distress.  HENT:  Head: Normocephalic and atraumatic.  Eyes: Pupils are equal, round, and reactive to light. Conjunctivae and EOM are normal.  Neck: Normal range of motion. Neck supple.  Cardiovascular: Normal rate, regular rhythm and intact  distal pulses. Exam reveals no gallop and no friction rub.  No murmur heard. Pulmonary/Chest: Effort normal and breath sounds normal. No respiratory distress. He has no wheezes.  Abdominal: Soft. Bowel sounds are normal. There is no tenderness.  Musculoskeletal: Normal range of motion. He exhibits no edema or tenderness.  Lymphadenopathy:    He has no cervical adenopathy.  Neurological: He is alert and oriented to person, place, and time. Gait normal.  Skin: Skin is warm and dry.  Psychiatric: Mood, memory, affect and judgment normal.  Nursing note and vitals reviewed.    ASSESSMENT/PLAN: 1. Essential hypertension The current medical regimen is effective;  continue present plan and medications. - Urinalysis Dipstick - CBC with Differential - Comprehensive metabolic panel  2. Type 2 diabetes mellitus with diabetic peripheral angiopathy and gangrene, with long-term current use of insulin ([name]) The current medical regimen is effective;  continue present plan and medications. - HgB A1c- increased. Patient contributes to pain - Glucose (CBG), Fasting - Ambulatory referral to Ophthalmology  3. Type 2 diabetes mellitus with complication, with long-term current use of insulin ([name]) The current medical regimen is effective;  continue present plan and medications. - Blood Glucose Monitoring Suppl (ACCU-CHEK AVIVA PLUS) w/Device KIT; 1 each by Does not apply route 4 (four) times daily -  before meals and at bedtime.  Dispense: 1 kit; Refill: 0 The patient is asked to make an attempt to improve diet and exercise patterns to aid in medical management of this problem.   Return to care as scheduled and prn. Patient verbalized  understanding and agreed with plan of care.   Ms. Doug Sou. Nathaneil Canary, FNP-BC [facility] [place], [place] [address] [phone]    The 2018 Physical Activity Guidelines recommend the equivalent of 150 minutes per week  of moderate to vigorous aerobic activity each week, with muscle-strengthening activities on two days during the week

## 2018-03-11 LAB — CBC WITH DIFFERENTIAL/PLATELET
Basophils Absolute: 0 10*3/uL (ref 0.0–0.2)
Basos: 0 %
EOS (ABSOLUTE): 0.2 10*3/uL (ref 0.0–0.4)
Eos: 2 %
Hematocrit: 37.6 % (ref 37.5–51.0)
Hemoglobin: 12.3 g/dL — ABNORMAL LOW (ref 13.0–17.7)
Immature Grans (Abs): 0 10*3/uL (ref 0.0–0.1)
Immature Granulocytes: 0 %
Lymphocytes Absolute: 1.5 10*3/uL (ref 0.7–3.1)
Lymphs: 23 %
MCH: 29.4 pg (ref 26.6–33.0)
MCHC: 32.7 g/dL (ref 31.5–35.7)
MCV: 90 fL (ref 79–97)
Monocytes Absolute: 0.6 10*3/uL (ref 0.1–0.9)
Monocytes: 10 %
Neutrophils Absolute: 4 10*3/uL (ref 1.4–7.0)
Neutrophils: 65 %
Platelets: 153 10*3/uL (ref 150–450)
RBC: 4.19 x10E6/uL (ref 4.14–5.80)
RDW: 14.1 % (ref 12.3–15.4)
WBC: 6.3 10*3/uL (ref 3.4–10.8)

## 2018-03-11 LAB — COMPREHENSIVE METABOLIC PANEL
ALT: 35 IU/L (ref 0–44)
AST: 31 IU/L (ref 0–40)
Albumin/Globulin Ratio: 2 (ref 1.2–2.2)
Albumin: 4.6 g/dL (ref 3.6–4.8)
Alkaline Phosphatase: 113 IU/L (ref 39–117)
BUN/Creatinine Ratio: 22 (ref 10–24)
BUN: 35 mg/dL — ABNORMAL HIGH (ref 8–27)
Bilirubin Total: 0.6 mg/dL (ref 0.0–1.2)
CO2: 19 mmol/L — ABNORMAL LOW (ref 20–29)
Calcium: 9.7 mg/dL (ref 8.6–10.2)
Chloride: 105 mmol/L (ref 96–106)
Creatinine, Ser: 1.58 mg/dL — ABNORMAL HIGH (ref 0.76–1.27)
GFR calc Af Amer: 52 mL/min/{1.73_m2} — ABNORMAL LOW (ref >59)
GFR calc non Af Amer: 45 mL/min/{1.73_m2} — ABNORMAL LOW (ref >59)
Globulin, Total: 2.3 g/dL (ref 1.5–4.5)
Glucose: 238 mg/dL — ABNORMAL HIGH (ref 65–99)
Potassium: 4.8 mmol/L (ref 3.5–5.2)
Sodium: 143 mmol/L (ref 134–144)
Total Protein: 6.9 g/dL (ref 6.0–8.5)

## 2018-03-17 ENCOUNTER — Other Ambulatory Visit (HOSPITAL_COMMUNITY): Payer: Self-pay | Admitting: Internal Medicine

## 2018-03-17 ENCOUNTER — Ambulatory Visit (INDEPENDENT_AMBULATORY_CARE_PROVIDER_SITE_OTHER): Payer: Medicare Other | Admitting: Pharmacist

## 2018-03-17 DIAGNOSIS — I480 Paroxysmal atrial fibrillation: Secondary | ICD-10-CM | POA: Diagnosis not present

## 2018-03-17 DIAGNOSIS — Z5181 Encounter for therapeutic drug level monitoring: Secondary | ICD-10-CM | POA: Diagnosis not present

## 2018-03-17 LAB — POCT INR: INR: 3.1 — AB (ref 2.0–3.0)

## 2018-03-24 ENCOUNTER — Other Ambulatory Visit: Payer: Self-pay | Admitting: Pharmacist

## 2018-03-24 ENCOUNTER — Other Ambulatory Visit (HOSPITAL_COMMUNITY): Payer: Self-pay | Admitting: Cardiovascular Disease

## 2018-03-24 MED ORDER — WARFARIN SODIUM 5 MG PO TABS: ORAL_TABLET | ORAL | 0 refills | Status: DC

## 2018-03-24 NOTE — Telephone Encounter (Signed)
Refill Request.  

## 2018-04-02 ENCOUNTER — Other Ambulatory Visit: Payer: Self-pay | Admitting: Family Medicine

## 2018-04-02 DIAGNOSIS — E1152 Type 2 diabetes mellitus with diabetic peripheral angiopathy with gangrene: Secondary | ICD-10-CM

## 2018-04-02 DIAGNOSIS — Z794 Long term (current) use of insulin: Principal | ICD-10-CM

## 2018-04-03 ENCOUNTER — Encounter: Payer: Self-pay | Admitting: Family Medicine

## 2018-04-03 LAB — HM DIABETES EYE EXAM

## 2018-04-07 ENCOUNTER — Ambulatory Visit (AMBULATORY_SURGERY_CENTER): Payer: Medicare Other | Admitting: Internal Medicine

## 2018-04-07 ENCOUNTER — Encounter: Payer: Self-pay | Admitting: Internal Medicine

## 2018-04-07 VITALS — BP 140/57 | HR 57 | Temp 98.6°F | Resp 11 | Ht 65.0 in | Wt 181.0 lb

## 2018-04-07 DIAGNOSIS — K317 Polyp of stomach and duodenum: Secondary | ICD-10-CM | POA: Diagnosis not present

## 2018-04-07 DIAGNOSIS — K625 Hemorrhage of anus and rectum: Secondary | ICD-10-CM

## 2018-04-07 DIAGNOSIS — D122 Benign neoplasm of ascending colon: Secondary | ICD-10-CM

## 2018-04-07 DIAGNOSIS — K3189 Other diseases of stomach and duodenum: Secondary | ICD-10-CM | POA: Diagnosis not present

## 2018-04-07 DIAGNOSIS — K2970 Gastritis, unspecified, without bleeding: Secondary | ICD-10-CM

## 2018-04-07 DIAGNOSIS — Z860101 Personal history of adenomatous and serrated colon polyps: Secondary | ICD-10-CM | POA: Insufficient documentation

## 2018-04-07 DIAGNOSIS — K219 Gastro-esophageal reflux disease without esophagitis: Secondary | ICD-10-CM | POA: Diagnosis not present

## 2018-04-07 DIAGNOSIS — K2950 Unspecified chronic gastritis without bleeding: Secondary | ICD-10-CM

## 2018-04-07 DIAGNOSIS — D124 Benign neoplasm of descending colon: Secondary | ICD-10-CM

## 2018-04-07 DIAGNOSIS — Z86010 Personal history of colonic polyps: Secondary | ICD-10-CM

## 2018-04-07 DIAGNOSIS — D123 Benign neoplasm of transverse colon: Secondary | ICD-10-CM

## 2018-04-07 HISTORY — DX: Personal history of colonic polyps: Z86.010

## 2018-04-07 HISTORY — DX: Personal history of adenomatous and serrated colon polyps: Z86.0101

## 2018-04-07 MED ORDER — SODIUM CHLORIDE 0.9 % IV SOLN: 500.0000 mL | Freq: Once | INTRAVENOUS | Status: DC

## 2018-04-07 NOTE — Progress Notes (Signed)
A/ox3, pleased with MAC, report to RN 

## 2018-04-07 NOTE — Op Note (Signed)
[facility] Patient Name: Rohn Fritsch Procedure Date: 04/07/2018 1:27 PM MRN: [id number] Endoscopist: Gatha Mayer , MD Age: 67 Referring MD:  Date of Birth: Mar 29, 1951 Gender: Male Account #: [id number] Procedure:                Upper GI endoscopy Indications:              Heartburn, Suspected esophageal reflux Medicines:                Propofol per Anesthesia, Monitored Anesthesia Care Procedure:                Pre-Anesthesia Assessment:                           - Prior to the procedure, a History and Physical                            was performed, and patient medications and                            allergies were reviewed. The patient's tolerance of                            previous anesthesia was also reviewed. The risks                            and benefits of the procedure and the sedation                            options and risks were discussed with the patient.                            All questions were answered, and informed consent                            was obtained. Prior Anticoagulants: The patient                            last took Coumadin (warfarin) 5 days prior to the                            procedure. ASA Grade Assessment: III - A patient                            with severe systemic disease. After reviewing the                            risks and benefits, the patient was deemed in                            satisfactory condition to undergo the procedure.                           After obtaining informed consent, the endoscope was  passed under direct vision. Throughout the                            procedure, the patient's blood pressure, pulse, and                            oxygen saturations were monitored continuously. The                            Model GIF-HQ190 [id number]) scope was introduced                            through the mouth, and advanced to the second part             of duodenum. The upper GI endoscopy was                            accomplished without difficulty. The patient                            tolerated the procedure well. Scope In: Scope Out: Findings:                 A single 3 mm sessile polyp with no bleeding and                            stigmata of recent bleeding was found in the                            gastric antrum. The polyp was removed with a cold                            biopsy forceps. Resection and retrieval were                            complete. Verification of patient identification                            for the specimen was done. Estimated blood loss was                            minimal.                           Patchy mild inflammation characterized by erythema                            and granularity was found in the gastric antrum.                            Biopsies were taken with a cold forceps for                            histology. Verification of patient identification  for the specimen was done. Estimated blood loss was                            minimal.                           The exam was otherwise without abnormality.                           The cardia and gastric fundus were normal on                            retroflexion. Complications:            No immediate complications. Estimated Blood Loss:     Estimated blood loss was minimal. Impression:               - A single gastric polyp. Resected and retrieved.                           - Gastritis. Biopsied.                           - The examination was otherwise normal. Recommendation:           - Patient has a contact number available for                            emergencies. The signs and symptoms of potential                            delayed complications were discussed with the                            patient. Return to normal activities tomorrow.                            Written  discharge instructions were provided to the                            patient.                           - Resume previous diet.                           - Continue present medications.                           - See the other procedure note for documentation of                            additional recommendations.                           - Continue PPI Gatha Mayer, MD 04/07/2018 2:45:45 PM This report has been signed electronically.

## 2018-04-07 NOTE — Patient Instructions (Addendum)
I removed a stomach polyp and 5 colon polyps.  I took biopsies of stomach inflammation.  I will let you know results and plans after I review the pathology results.  Please restart warfarin tonight and follow recommendations you were provided from the anti-coagulation clinic.  I appreciate the opportunity to care for you. Gatha Mayer, MD, FACG   YOU HAD AN ENDOSCOPIC PROCEDURE TODAY AT [facility]:   Refer to the procedure report that was given to you for any specific questions about what was found during the examination.  If the procedure report does not answer your questions, please call your gastroenterologist to clarify.  If you requested that your care partner not be given the details of your procedure findings, then the procedure report has been included in a sealed envelope for you to review at your convenience later.  YOU SHOULD EXPECT: Some feelings of bloating in the abdomen. Passage of more gas than usual.  Walking can help get rid of the air that was put into your GI tract during the procedure and reduce the bloating. If you had a lower endoscopy (such as a colonoscopy or flexible sigmoidoscopy) you may notice spotting of blood in your stool or on the toilet paper. If you underwent a bowel prep for your procedure, you may not have a normal bowel movement for a few days.  Please Note:  You might notice some irritation and congestion in your nose or some drainage.  This is from the oxygen used during your procedure.  There is no need for concern and it should clear up in a day or so.  SYMPTOMS TO REPORT IMMEDIATELY:   Following lower endoscopy (colonoscopy or flexible sigmoidoscopy):  Excessive amounts of blood in the stool  Significant tenderness or worsening of abdominal pains  Swelling of the abdomen that is new, acute  Fever of 100F or higher   Following upper endoscopy (EGD)  Vomiting of blood or coffee ground material  New chest pain or  pain under the shoulder blades  Painful or persistently difficult swallowing  New shortness of breath  Fever of 100F or higher  Black, tarry-looking stools  For urgent or emergent issues, a gastroenterologist can be reached at any hour by calling [phone].   DIET:  We do recommend a small meal at first, but then you may proceed to your regular diet.  Drink plenty of fluids but you should avoid alcoholic beverages for 24 hours.  ACTIVITY:  You should plan to take it easy for the rest of today and you should NOT DRIVE or use heavy machinery until tomorrow (because of the sedation medicines used during the test).    FOLLOW UP: Our staff will call the number listed on your records the next business day following your procedure to check on you and address any questions or concerns that you may have regarding the information given to you following your procedure. If we do not reach you, we will leave a message.  However, if you are feeling well and you are not experiencing any problems, there is no need to return our call.  We will assume that you have returned to your regular daily activities without incident.  If any biopsies were taken you will be contacted by phone or by letter within the next 1-3 weeks.  Please call us at [phone] if you have not heard about the biopsies in 3 weeks.    SIGNATURES/CONFIDENTIALITY: You and/or your care partner  have signed paperwork which will be entered into your electronic medical record.  These signatures attest to the fact that that the information above on your After Visit Summary has been reviewed and is understood.  Full responsibility of the confidentiality of this discharge information lies with you and/or your care-partner.

## 2018-04-07 NOTE — Progress Notes (Signed)
Called to room to assist during endoscopic procedure.  Patient ID and intended procedure confirmed with present staff. Received instructions for my participation in the procedure from the performing physician.  

## 2018-04-07 NOTE — Op Note (Signed)
[facility] Patient Name: William [name] Procedure Date: 04/07/2018 1:27 PM MRN: [id number] Endoscopist: Gatha Mayer , MD Age: 67 Referring MD:  Date of Birth: 1950-08-28 Gender: Male Account #: [id number] Procedure:                Colonoscopy Indications:              Rectal bleeding Medicines:                Propofol per Anesthesia, Monitored Anesthesia Care Procedure:                Pre-Anesthesia Assessment:                           - Prior to the procedure, a History and Physical                            was performed, and patient medications and                            allergies were reviewed. The patient's tolerance of                            previous anesthesia was also reviewed. The risks                            and benefits of the procedure and the sedation                            options and risks were discussed with the patient.                            All questions were answered, and informed consent                            was obtained. Prior Anticoagulants: The patient                            last took Coumadin (warfarin) 5 days prior to the                            procedure. ASA Grade Assessment: III - A patient                            with severe systemic disease. After reviewing the                            risks and benefits, the patient was deemed in                            satisfactory condition to undergo the procedure.                           After obtaining informed consent, the colonoscope  was passed under direct vision. Throughout the                            procedure, the patient's blood pressure, pulse, and                            oxygen saturations were monitored continuously. The                            Model CF-HQ190L [id number]) scope was introduced                            through the anus and advanced to the the cecum,                            identified by  appendiceal orifice and ileocecal                            valve. The colonoscopy was performed without                            difficulty. The patient tolerated the procedure                            well. The quality of the bowel preparation was                            adequate. The ileocecal valve, appendiceal orifice,                            and rectum were photographed. The bowel preparation                            used was Miralax. Scope In: 1:56:30 PM Scope Out: 2:32:05 PM Scope Withdrawal Time: 0 hours 22 minutes 27 seconds  Total Procedure Duration: 0 hours 35 minutes 35 seconds  Findings:                 The perianal and digital rectal examinations were                            normal. Pertinent negatives include normal prostate                            (size, shape, and consistency).                           Three sessile polyps were found in the descending                            colon. The polyps were 10 to 15 mm in size. These                            polyps were removed with a hot snare. Resection and  retrieval were complete. Verification of patient                            identification for the specimen was done. Estimated                            blood loss: none.                           Two sessile polyps were found in the transverse                            colon and ascending colon. The polyps were                            diminutive in size. These polyps were removed with                            a cold snare. Resection was complete, but the polyp                            tissue was only partially retrieved. Verification                            of patient identification for the specimen was                            done. Estimated blood loss was minimal.                           The colon (entire examined portion) was                            significantly redundant. Advancing the scope                             required applying abdominal pressure.                           The exam was otherwise without abnormality on                            direct and retroflexion views. Complications:            No immediate complications. Estimated Blood Loss:     Estimated blood loss was minimal. Impression:               - Three 10 to 15 mm polyps in the descending colon,                            removed with a hot snare. Resected and retrieved.                           - Two diminutive polyps in the transverse colon and  in the ascending colon, removed with a cold snare.                            Complete resection. Partial retrieval.                           - Redundant colon.                           - The examination was otherwise normal on direct                            and retroflexion views. Recommendation:           - Patient has a contact number available for                            emergencies. The signs and symptoms of potential                            delayed complications were discussed with the                            patient. Return to normal activities tomorrow.                            Written discharge instructions were provided to the                            patient.                           - Resume previous diet.                           - Continue present medications.                           - Resume Coumadin (warfarin) at prior dose today.                            Refer to [facility] for further adjustment of                            therapy.                           - Repeat colonoscopy is recommended. The                            colonoscopy date will be determined after pathology                            results from today's exam become available for                            review.                           -  Extra prep next time Gatha Mayer, MD 04/07/2018 2:50:36 PM This report has  been signed electronically.

## 2018-04-08 ENCOUNTER — Telehealth: Payer: Self-pay

## 2018-04-08 NOTE — Telephone Encounter (Signed)
  Follow up Call-  Call back number 04/07/2018  Post procedure Call Back phone  # [phone]  Permission to leave phone message Yes  Some recent data might be hidden     Patient questions:  Do you have a fever, pain , or abdominal swelling? No. Pain Score  0 *  Have you tolerated food without any problems? Yes.    Have you been able to return to your normal activities? Yes.    Do you have any questions about your discharge instructions: Diet   No. Medications  No. Follow up visit  No.  Do you have questions or concerns about your Care? No.  Actions: * If pain score is 4 or above: No action needed, pain <4.

## 2018-04-15 ENCOUNTER — Ambulatory Visit (INDEPENDENT_AMBULATORY_CARE_PROVIDER_SITE_OTHER): Payer: Medicare Other | Admitting: Pharmacist Clinician (PhC)/ Clinical Pharmacy Specialist

## 2018-04-15 DIAGNOSIS — Z5181 Encounter for therapeutic drug level monitoring: Secondary | ICD-10-CM | POA: Diagnosis not present

## 2018-04-15 DIAGNOSIS — I480 Paroxysmal atrial fibrillation: Secondary | ICD-10-CM | POA: Diagnosis not present

## 2018-04-15 LAB — POCT INR: INR: 2.2 (ref 2.0–3.0)

## 2018-04-22 ENCOUNTER — Other Ambulatory Visit (HOSPITAL_COMMUNITY): Payer: Self-pay | Admitting: Internal Medicine

## 2018-04-23 ENCOUNTER — Encounter: Payer: Self-pay | Admitting: Internal Medicine

## 2018-04-23 DIAGNOSIS — Z86010 Personal history of colonic polyps: Secondary | ICD-10-CM

## 2018-04-23 NOTE — Progress Notes (Signed)
Colon adenomas max 15 mm - COLON [id number] Gastritis, inflammatory polyp STAY ON PPI

## 2018-04-30 ENCOUNTER — Telehealth: Payer: Self-pay

## 2018-04-30 NOTE — Telephone Encounter (Signed)
Called and spoke with patient's daughter (caregiver). She states patient is almost out of insulin and pharmacy says he can't get it refilled until November 4th. I reviewed dosage and he is correctly taking this at 55 units nightly.  I called [facility] and spoke with Mike(Pharmacists) he states that this is only for a 27 days supply and he is unsure why the insurance is not allowing this to go through. Ronalee Belts states he will call insurance and try to get approved and call patient when resolved.   I called back and spoke with daughter and advised of what Ronalee Belts was doing and that she should here from him asap. Thanks!

## 2018-05-08 ENCOUNTER — Other Ambulatory Visit: Payer: Self-pay | Admitting: Family Medicine

## 2018-05-08 DIAGNOSIS — E1152 Type 2 diabetes mellitus with diabetic peripheral angiopathy with gangrene: Secondary | ICD-10-CM

## 2018-05-08 DIAGNOSIS — Z794 Long term (current) use of insulin: Principal | ICD-10-CM

## 2018-05-12 ENCOUNTER — Ambulatory Visit (INDEPENDENT_AMBULATORY_CARE_PROVIDER_SITE_OTHER): Payer: Medicare Other | Admitting: Pharmacist Clinician (PhC)/ Clinical Pharmacy Specialist

## 2018-05-12 DIAGNOSIS — Z5181 Encounter for therapeutic drug level monitoring: Secondary | ICD-10-CM

## 2018-05-12 DIAGNOSIS — I480 Paroxysmal atrial fibrillation: Secondary | ICD-10-CM

## 2018-05-12 LAB — POCT INR: INR: 2.2 (ref 2.0–3.0)

## 2018-05-19 ENCOUNTER — Other Ambulatory Visit: Payer: Self-pay | Admitting: Family Medicine

## 2018-05-19 MED ORDER — ROSUVASTATIN CALCIUM 40 MG PO TABS: 40.0000 mg | ORAL_TABLET | Freq: Every day | ORAL | 1 refills | Status: DC

## 2018-05-29 ENCOUNTER — Other Ambulatory Visit (HOSPITAL_COMMUNITY): Payer: Self-pay | Admitting: Internal Medicine

## 2018-06-02 ENCOUNTER — Other Ambulatory Visit (HOSPITAL_COMMUNITY): Payer: Self-pay | Admitting: Internal Medicine

## 2018-06-03 ENCOUNTER — Other Ambulatory Visit: Payer: Self-pay | Admitting: Family Medicine

## 2018-06-03 NOTE — Telephone Encounter (Signed)
Refill request for tylenol #3. Please advise. [employer]

## 2018-06-06 ENCOUNTER — Other Ambulatory Visit (HOSPITAL_COMMUNITY): Payer: Self-pay | Admitting: Internal Medicine

## 2018-06-06 ENCOUNTER — Other Ambulatory Visit: Payer: Self-pay | Admitting: Family Medicine

## 2018-06-06 DIAGNOSIS — G2581 Restless legs syndrome: Secondary | ICD-10-CM

## 2018-06-09 ENCOUNTER — Ambulatory Visit: Payer: Medicare Other | Admitting: Family Medicine

## 2018-06-09 ENCOUNTER — Other Ambulatory Visit: Payer: Self-pay

## 2018-06-09 ENCOUNTER — Ambulatory Visit (INDEPENDENT_AMBULATORY_CARE_PROVIDER_SITE_OTHER): Payer: Medicare Other | Admitting: Pharmacist Clinician (PhC)/ Clinical Pharmacy Specialist

## 2018-06-09 DIAGNOSIS — E1152 Type 2 diabetes mellitus with diabetic peripheral angiopathy with gangrene: Secondary | ICD-10-CM

## 2018-06-09 DIAGNOSIS — Z5181 Encounter for therapeutic drug level monitoring: Secondary | ICD-10-CM

## 2018-06-09 DIAGNOSIS — I480 Paroxysmal atrial fibrillation: Secondary | ICD-10-CM

## 2018-06-09 DIAGNOSIS — Z794 Long term (current) use of insulin: Secondary | ICD-10-CM

## 2018-06-09 LAB — POCT INR: INR: 2.6 (ref 2.0–3.0)

## 2018-06-09 MED ORDER — INSULIN PEN NEEDLE 31G X 8 MM MISC: 1.0000 | Freq: Four times a day (QID) | 12 refills | Status: DC

## 2018-06-09 NOTE — Patient Instructions (Signed)
Description   Continue with 1.5 tablets daily except for 1 tablet every Monday and Wednesday repeat INR in 4 weeks.

## 2018-06-12 ENCOUNTER — Other Ambulatory Visit: Payer: Self-pay

## 2018-06-12 DIAGNOSIS — E1152 Type 2 diabetes mellitus with diabetic peripheral angiopathy with gangrene: Secondary | ICD-10-CM

## 2018-06-12 DIAGNOSIS — Z794 Long term (current) use of insulin: Secondary | ICD-10-CM

## 2018-06-12 MED ORDER — GABAPENTIN 300 MG PO CAPS: ORAL_CAPSULE | ORAL | 0 refills | Status: DC

## 2018-06-16 ENCOUNTER — Ambulatory Visit (INDEPENDENT_AMBULATORY_CARE_PROVIDER_SITE_OTHER): Payer: Medicare Other | Admitting: Family Medicine

## 2018-06-16 ENCOUNTER — Encounter: Payer: Self-pay | Admitting: Family Medicine

## 2018-06-16 VITALS — BP 95/53 | HR 72 | Temp 97.9°F | Resp 16 | Ht 65.0 in | Wt 175.0 lb

## 2018-06-16 DIAGNOSIS — I10 Essential (primary) hypertension: Secondary | ICD-10-CM

## 2018-06-16 DIAGNOSIS — Z794 Long term (current) use of insulin: Secondary | ICD-10-CM

## 2018-06-16 DIAGNOSIS — E1152 Type 2 diabetes mellitus with diabetic peripheral angiopathy with gangrene: Secondary | ICD-10-CM | POA: Diagnosis not present

## 2018-06-16 LAB — POCT URINALYSIS DIPSTICK
Bilirubin, UA: NEGATIVE
Glucose, UA: NEGATIVE
Ketones, UA: NEGATIVE
Leukocytes, UA: NEGATIVE
Nitrite, UA: NEGATIVE
Protein, UA: NEGATIVE
Spec Grav, UA: <=1.005 — AB (ref 1.010–1.025)
Urobilinogen, UA: 1 E.U./dL
pH, UA: 5.5 (ref 5.0–8.0)

## 2018-06-16 LAB — POCT GLYCOSYLATED HEMOGLOBIN (HGB A1C): Hemoglobin A1C: 7.9 % — AB (ref 4.0–5.6)

## 2018-06-16 LAB — GLUCOSE, POCT (MANUAL RESULT ENTRY): POC Glucose: 196 mg/dl — AB (ref 70–99)

## 2018-06-16 MED ORDER — INSULIN GLARGINE 100 UNIT/ML SOLOSTAR PEN: 55.0000 [IU] | PEN_INJECTOR | Freq: Every day | SUBCUTANEOUS | 11 refills | Status: DC

## 2018-06-16 MED ORDER — ROSUVASTATIN CALCIUM 40 MG PO TABS: 40.0000 mg | ORAL_TABLET | Freq: Every day | ORAL | 3 refills | Status: DC

## 2018-06-16 MED ORDER — INSULIN GLARGINE 100 UNIT/ML SOLOSTAR PEN: 45.0000 [IU] | PEN_INJECTOR | Freq: Every day | SUBCUTANEOUS | 11 refills | Status: DC

## 2018-06-16 NOTE — Progress Notes (Signed)
Patient [name]   Progress Note: General Provider: [name], FNP  SUBJECTIVE:   William [name] is a 67 y.o. male who  has a past medical history of AKI (acute kidney injury) ([place]), Chronic systolic CHF (congestive heart failure) ([place]), Diabetes mellitus without complication ([place]), adenomatous colonic polyps (04/07/2018), Hyperlipidemia, Hypertension, Paroxysmal atrial fibrillation ([place]), and STEMI (ST elevation myocardial infarction) ([place]) (2017).. Patient presents today for Hypertension and Diabetes  Review of Systems  Constitutional: Negative.   HENT: Negative.   Eyes: Negative.   Respiratory: Negative.   Cardiovascular: Negative.   Gastrointestinal: Negative.   Genitourinary: Negative.   Musculoskeletal: Negative.   Skin: Negative.   Neurological: Negative.   Psychiatric/Behavioral: Negative.      OBJECTIVE: BP (!) 95/53 (BP Location: Right Arm, Patient Position: Sitting, Cuff Size: Normal)   Pulse 72   Temp 97.9 F (36.6 C) (Oral)   Resp 16   Ht 5\' 5"  (1.651 m)   Wt 175 lb (79.4 kg)   SpO2 100%   BMI 29.12 kg/m   Wt Readings from Last 3 Encounters:  06/16/18 175 lb (79.4 kg)  04/07/18 181 lb (82.1 kg)  03/10/18 181 lb (82.1 kg)     Physical Exam Vitals signs and nursing note reviewed.  Constitutional:      General: He is not in acute distress.    Appearance: He is well-developed.  HENT:     Head: Normocephalic and atraumatic.  Eyes:     Conjunctiva/sclera: Conjunctivae normal.     Pupils: Pupils are equal, round, and reactive to light.  Neck:     Musculoskeletal: Normal range of motion.  Cardiovascular:     Rate and Rhythm: Normal rate and regular rhythm.     Heart sounds: Normal heart sounds.  Pulmonary:     Effort: Pulmonary effort is normal. No respiratory distress.     Breath sounds: Normal breath sounds.  Abdominal:     General: Bowel sounds are normal. There is no distension.     Palpations: Abdomen  is soft.  Musculoskeletal: Normal range of motion.  Skin:    General: Skin is warm and dry.  Neurological:     Mental Status: He is alert and oriented to person, place, and time.  Psychiatric:        Behavior: Behavior normal.        Thought Content: Thought content normal.     ASSESSMENT/PLAN:  1. Essential hypertension Continue with current medications.  - Urinalysis Dipstick  2. Type 2 diabetes mellitus with diabetic peripheral angiopathy and gangrene, with long-term current use of insulin ([name]) D/c Levemir and start lantus 45 units QHS. Monitor FBS with goal 126-150. A1c 7.9.  - Urinalysis Dipstick - Glucose (CBG) - HgB A1c - rosuvastatin (CRESTOR) 40 MG tablet; Take 1 tablet (40 mg total) by mouth daily.  Dispense: 90 tablet; Refill: 3 - Insulin Glargine (LANTUS SOLOSTAR) 100 UNIT/ML Solostar Pen; Inject 45 Units into the skin at bedtime.  Dispense: 5 pen; Refill: 11         The patient was given clear instructions to go to ER or return to medical center if symptoms do not improve, worsen or new problems develop. The patient verbalized understanding and agreed with plan of care.   Ms. Doug Sou. Nathaneil Canary, FNP-BC Patient [facility] [place], [place] [address] [phone]     This note has been created with Dragon speech recognition software  and smart Company secretary. Any transcriptional errors are unintentional.

## 2018-06-16 NOTE — Patient Instructions (Signed)
I changed your insulin to 45 units of Lantus every night. The goal is to get your morning fasting blood sugar to 126.  I will see you in 6 weeks.    Insulin Glargine injection What is this medicine? INSULIN GLARGINE (IN su lin GLAR geen) is a human-made form of insulin. This drug lowers the amount of sugar in your blood. It is a long-acting insulin that is usually given once a day. This medicine may be used for other purposes; ask your health care provider or pharmacist if you have questions. COMMON BRAND NAME(S): BASAGLAR, Lantus, Lantus SoloStar, Toujeo SoloStar What should I tell my health care provider before I take this medicine? They need to know if you have any of these conditions: -episodes of low blood sugar -kidney disease -liver disease -an unusual or allergic reaction to insulin, metacresol, other medicines, foods, dyes, or preservatives -pregnant or trying to get pregnant -breast-feeding How should I use this medicine? This medicine is for injection under the skin. Use this medicine at the same time each day. Use exactly as directed. This insulin should never be mixed in the same syringe with other insulins before injection. Do not vigorously shake before use. You will be taught how to use this medicine and how to adjust doses for activities and illness. Do not use more insulin than prescribed. Always check the appearance of your insulin before using it. This medicine should be clear and colorless like water. Do not use it if it is cloudy, thickened, colored, or has solid particles in it. It is important that you put your used needles and syringes in a special sharps container. Do not put them in a trash can. If you do not have a sharps container, call your pharmacist or healthcare provider to get one. Talk to your pediatrician regarding the use of this medicine in children. Special care may be needed. Overdosage: If you think you have taken too much of this medicine contact a  poison control center or emergency room at once. NOTE: This medicine is only for you. Do not share this medicine with others. What if I miss a dose? It is important not to miss a dose. Your health care professional or doctor should discuss a plan for missed doses with you. If you do miss a dose, follow their plan. Do not take double doses. What may interact with this medicine? -other medicines for diabetes Many medications may cause changes in blood sugar, these include: -alcohol containing beverages -antiviral medicines for HIV or AIDS -aspirin and aspirin-like drugs -certain medicines for blood pressure, heart disease, irregular heart beat -chromium -diuretics -male hormones, such as estrogens or progestins, birth control pills -fenofibrate -gemfibrozil -isoniazid -lanreotide -male hormones or anabolic steroids -MAOIs like Carbex, Eldepryl, Marplan, Nardil, and Parnate -medicines for weight loss -medicines for allergies, asthma, cold, or cough -medicines for depression, anxiety, or psychotic disturbances -niacin -nicotine -NSAIDs, medicines for pain and inflammation, like ibuprofen or naproxen -octreotide -pasireotide -pentamidine -phenytoin -probenecid -quinolone antibiotics such as ciprofloxacin, levofloxacin, ofloxacin -some herbal dietary supplements -steroid medicines such as prednisone or cortisone -sulfamethoxazole; trimethoprim -thyroid hormones Some medications can hide the warning symptoms of low blood sugar (hypoglycemia). You may need to monitor your blood sugar more closely if you are taking one of these medications. These include: -beta-blockers, often used for high blood pressure or heart problems (examples include atenolol, metoprolol, propranolol) -clonidine -guanethidine -reserpine This list may not describe all possible interactions. Give your health care provider a list of all  the medicines, herbs, non-prescription drugs, or dietary supplements you  use. Also tell them if you smoke, drink alcohol, or use illegal drugs. Some items may interact with your medicine. What should I watch for while using this medicine? Visit your health care professional or doctor for regular checks on your progress. Do not drive, use machinery, or do anything that needs mental alertness until you know how this medicine affects you. Alcohol may interfere with the effect of this medicine. Avoid alcoholic drinks. A test called the HbA1C (A1C) will be monitored. This is a simple blood test. It measures your blood sugar control over the last 2 to 3 months. You will receive this test every 3 to 6 months. Learn how to check your blood sugar. Learn the symptoms of low and high blood sugar and how to manage them. Always carry a quick-source of sugar with you in case you have symptoms of low blood sugar. Examples include hard sugar candy or glucose tablets. Make sure others know that you can choke if you eat or drink when you develop serious symptoms of low blood sugar, such as seizures or unconsciousness. They must get medical help at once. Tell your doctor or health care professional if you have high blood sugar. You might need to change the dose of your medicine. If you are sick or exercising more than usual, you might need to change the dose of your medicine. Do not skip meals. Ask your doctor or health care professional if you should avoid alcohol. Many nonprescription cough and cold products contain sugar or alcohol. These can affect blood sugar. Make sure that you have the right kind of syringe for the type of insulin you use. Try not to change the brand and type of insulin or syringe unless your health care professional or doctor tells you to. Switching insulin brand or type can cause dangerously high or low blood sugar. Always keep an extra supply of insulin, syringes, and needles on hand. Use a syringe one time only. Throw away syringe and needle in a closed container to  prevent accidental needle sticks. Insulin pens and cartridges should never be shared. Even if the needle is changed, sharing may result in passing of viruses like hepatitis or HIV. Wear a medical ID bracelet or chain, and carry a card that describes your disease and details of your medicine and dosage times. What side effects may I notice from receiving this medicine? Side effects that you should report to your doctor or health care professional as soon as possible: -allergic reactions like skin rash, itching or hives, swelling of the face, lips, or tongue -breathing problems -signs and symptoms of high blood sugar such as dizziness, dry mouth, dry skin, fruity breath, nausea, stomach pain, increased hunger or thirst, increased urination -signs and symptoms of low blood sugar such as feeling anxious, confusion, dizziness, increased hunger, unusually weak or tired, sweating, shakiness, cold, irritable, headache, blurred vision, fast heartbeat, loss of consciousness Side effects that usually do not require medical attention (report to your doctor or health care professional if they continue or are bothersome): -increase or decrease in fatty tissue under the skin due to overuse of a particular injection site -itching, burning, swelling, or rash at site where injected This list may not describe all possible side effects. Call your doctor for medical advice about side effects. You may report side effects to FDA at 1-800-FDA-1088. Where should I keep my medicine? Keep out of the reach of children. Store unopened vials  in a refrigerator between 2 and 8 degrees C (36 and 46 degrees F). Do not freeze or use if the insulin has been frozen. Opened vials (vials currently in use) may be stored in the refrigerator or at room temperature, at approximately 25 degrees C (77 degrees F) or cooler. Keeping your insulin at room temperature decreases the amount of pain during injection. Once opened, your insulin can be  used for 28 days. After 28 days, the vial should be thrown away. Store Lantus [employer] in a refrigerator between 2 and 8 degrees C (36 and 46 degrees F) or at room temperature below 30 degrees C (86 degrees F). Do not freeze or use if the insulin has been frozen. Once opened, the pens should be kept at room temperature. Do not store in the refrigerator once opened. Once opened, the insulin can be used for 28 days. After 28 days, the Lantus Solostar Pen or Basaglar KwikPen should be thrown away. Store [employer] in a refrigerator between 2 and 8 degrees C (36 and 46 degrees F). Do not freeze or use if the insulin has been frozen. Once opened, the pens should be kept at room temperature below 30 degrees C (86 degrees F). Do not store in the refrigerator once opened. Once opened, the insulin can be used for 42 days. After 42 days, the [employer] Pen should be thrown away. Protect from light and excessive heat. Throw away any unused medicine after the expiration date or after the specified time for room temperature storage has passed. NOTE: This sheet is a summary. It may not cover all possible information. If you have questions about this medicine, talk to your doctor, pharmacist, or health care provider.  2018 Elsevier/Gold Standard (2016-07-04 10:26:25)

## 2018-06-24 ENCOUNTER — Other Ambulatory Visit: Payer: Self-pay

## 2018-06-24 MED ORDER — WARFARIN SODIUM 5 MG PO TABS: ORAL_TABLET | ORAL | 0 refills | Status: DC

## 2018-07-05 ENCOUNTER — Other Ambulatory Visit (HOSPITAL_COMMUNITY): Payer: Self-pay | Admitting: Internal Medicine

## 2018-07-07 ENCOUNTER — Ambulatory Visit (INDEPENDENT_AMBULATORY_CARE_PROVIDER_SITE_OTHER): Payer: Medicare Other | Admitting: Pharmacist

## 2018-07-07 ENCOUNTER — Telehealth (HOSPITAL_COMMUNITY): Payer: Self-pay

## 2018-07-07 ENCOUNTER — Other Ambulatory Visit (HOSPITAL_COMMUNITY): Payer: Self-pay | Admitting: Internal Medicine

## 2018-07-07 DIAGNOSIS — Z5181 Encounter for therapeutic drug level monitoring: Secondary | ICD-10-CM | POA: Diagnosis not present

## 2018-07-07 DIAGNOSIS — I480 Paroxysmal atrial fibrillation: Secondary | ICD-10-CM

## 2018-07-07 LAB — POCT INR: INR: 2.8 (ref 2.0–3.0)

## 2018-07-07 MED ORDER — TORSEMIDE 20 MG PO TABS: ORAL_TABLET | ORAL | 0 refills | Status: DC

## 2018-07-07 NOTE — Telephone Encounter (Signed)
Pt's daughter called wanting refills for coreg and torsemide regarding pt. Pt was advised that future refills should come from [facility] Dr. Fletcher Anon.

## 2018-07-12 ENCOUNTER — Other Ambulatory Visit: Payer: Self-pay | Admitting: Family Medicine

## 2018-07-12 DIAGNOSIS — E1152 Type 2 diabetes mellitus with diabetic peripheral angiopathy with gangrene: Secondary | ICD-10-CM

## 2018-07-12 DIAGNOSIS — Z794 Long term (current) use of insulin: Principal | ICD-10-CM

## 2018-07-28 ENCOUNTER — Ambulatory Visit (INDEPENDENT_AMBULATORY_CARE_PROVIDER_SITE_OTHER): Payer: Medicare Other | Admitting: *Deleted

## 2018-07-28 ENCOUNTER — Ambulatory Visit (INDEPENDENT_AMBULATORY_CARE_PROVIDER_SITE_OTHER): Payer: Medicare Other | Admitting: Family Medicine

## 2018-07-28 ENCOUNTER — Encounter: Payer: Self-pay | Admitting: Family Medicine

## 2018-07-28 VITALS — BP 91/47 | HR 69 | Temp 98.3°F | Resp 16 | Ht 65.0 in | Wt 177.0 lb

## 2018-07-28 DIAGNOSIS — I5022 Chronic systolic (congestive) heart failure: Secondary | ICD-10-CM

## 2018-07-28 DIAGNOSIS — Z5181 Encounter for therapeutic drug level monitoring: Secondary | ICD-10-CM

## 2018-07-28 DIAGNOSIS — I480 Paroxysmal atrial fibrillation: Secondary | ICD-10-CM | POA: Diagnosis not present

## 2018-07-28 DIAGNOSIS — E1152 Type 2 diabetes mellitus with diabetic peripheral angiopathy with gangrene: Secondary | ICD-10-CM | POA: Diagnosis not present

## 2018-07-28 DIAGNOSIS — H9193 Unspecified hearing loss, bilateral: Secondary | ICD-10-CM | POA: Diagnosis not present

## 2018-07-28 DIAGNOSIS — N183 Chronic kidney disease, stage 3 unspecified: Secondary | ICD-10-CM

## 2018-07-28 DIAGNOSIS — Z794 Long term (current) use of insulin: Secondary | ICD-10-CM

## 2018-07-28 LAB — POCT INR: INR: 2.8 (ref 2.0–3.0)

## 2018-07-28 NOTE — Patient Instructions (Signed)
Description   Today take 1/2 tablet, then start taking 1.5 tablets daily except for 1 tablet every Mondays, Wednesdays and Fridays. Repeat INR in 2 weeks.

## 2018-07-28 NOTE — Progress Notes (Signed)
Patient [facility]   Progress Note: General Provider: [name], FNP  SUBJECTIVE:   William [name] is a 68 y.o. male who  has a past medical history of AKI (acute kidney injury) ([place]), Chronic systolic CHF (congestive heart failure) ([place]), Diabetes mellitus without complication ([place]), adenomatous colonic polyps (04/07/2018), Hyperlipidemia, Hypertension, Paroxysmal atrial fibrillation ([facility]), and STEMI (ST elevation myocardial infarction) ([place]) (2017).. Patient presents today for Diabetes Patient with a history of diabetes.  Was seen 6 weeks ago with A1c of 7.9.  He discontinued Levemir and we started Lantus 45 units every night.  Patient states that he has not been able to check his fasting blood sugars due to not having supplies. He has been testing 4 times per day and states that his levels are in the 90s.   Denies history of hypoglycemic episodes at the present time.   Review of Systems  Constitutional: Negative.   HENT: Negative.   Eyes: Negative.   Respiratory: Negative.   Cardiovascular: Negative.   Gastrointestinal: Negative.   Genitourinary: Negative.   Musculoskeletal: Negative.   Skin: Negative.   Neurological: Negative.   Psychiatric/Behavioral: Negative.      OBJECTIVE: BP (!) 91/47 (BP Location: Right Arm, Patient Position: Sitting, Cuff Size: Normal)   Pulse 69   Temp 98.3 F (36.8 C) (Oral)   Resp 16   Ht 5\' 5"  (1.651 m)   Wt 177 lb (80.3 kg)   SpO2 99%   BMI 29.45 kg/m   Wt Readings from Last 3 Encounters:  07/28/18 177 lb (80.3 kg)  06/16/18 175 lb (79.4 kg)  04/07/18 181 lb (82.1 kg)     Physical Exam Vitals signs and nursing note reviewed.  Constitutional:      General: He is not in acute distress.    Appearance: He is well-developed.  HENT:     Head: Normocephalic and atraumatic.  Eyes:     Conjunctiva/sclera: Conjunctivae normal.     Pupils: Pupils are equal, round, and reactive to light.  Neck:   Musculoskeletal: Normal range of motion.  Cardiovascular:     Rate and Rhythm: Normal rate and regular rhythm.     Heart sounds: Normal heart sounds.  Pulmonary:     Effort: Pulmonary effort is normal. No respiratory distress.     Breath sounds: Normal breath sounds.  Abdominal:     General: Bowel sounds are normal. There is no distension.     Palpations: Abdomen is soft.  Musculoskeletal: Normal range of motion.  Skin:    General: Skin is warm and dry.  Neurological:     Mental Status: He is alert and oriented to person, place, and time.  Psychiatric:        Behavior: Behavior normal.        Thought Content: Thought content normal.     ASSESSMENT/PLAN:  1. Type 2 diabetes mellitus with diabetic peripheral angiopathy and gangrene, with long-term current use of insulin ([name]) No medication changes.  The patient is asked to make an attempt to improve diet and exercise patterns to aid in medical management of this problem.   2. CKD (chronic kidney disease) stage 3, GFR 30-59 ml/min ([name]) Patient went to nephrology today. Awaiting notes. Patient is being followed by a specialist for this condition. Patient advised to continue with follow up appointments. PCP will continue to monitor progress.    3. Chronic systolic HF (heart failure) ([name]) No medication changes warranted at the present time.  4. Decreased hearing of both ears Patient states that he has permanent hearing loss in the right ear.  - Ambulatory referral to Audiology    Patient recently ate. He states that he cannot get supplies until the end of the month. No medication changes. Will repeat a1C in March.    Return in about 7 weeks (around 09/15/2018) for DM- A1c.    The patient was given clear instructions to go to ER or return to medical center if symptoms do not improve, worsen or new problems develop. The patient verbalized understanding and agreed with plan of care.   Ms. Doug Sou. Nathaneil Canary, FNP-BC Patient  [facility] [address] [place], [place] [address] [phone]

## 2018-07-28 NOTE — Patient Instructions (Addendum)
Continue with lantus at 45 units. I will see you in March. Continue with healthy eating and exercise.    Diabetes Mellitus and Standards of Medical Care Managing diabetes (diabetes mellitus) can be complicated. Your diabetes treatment may be managed by a team of health care providers, including:  A physician who specializes in diabetes (endocrinologist).  A nurse practitioner or physician assistant.  Nurses.  A diet and nutrition specialist (registered dietitian).  A certified diabetes educator (CDE).  An exercise specialist.  A pharmacist.  An eye doctor.  A foot specialist (podiatrist).  A dentist.  A primary care provider.  A mental health provider. Your health care providers follow guidelines to help you get the best quality of care. The following schedule is a general guideline for your diabetes management plan. Your health care providers may give you more specific instructions. Physical exams Upon being diagnosed with diabetes mellitus, and each year after that, your health care provider will ask about your medical and family history. He or she will also do a physical exam. Your exam may include:  Measuring your height, weight, and body mass index (BMI).  Checking your blood pressure. This will be done at every routine medical visit. Your target blood pressure may vary depending on your medical conditions, your age, and other factors.  Thyroid gland exam.  Skin exam.  Screening for damage to your nerves (peripheral neuropathy). This may include checking the pulse in your legs and feet and checking the level of sensation in your hands and feet.  A complete foot exam to inspect the structure and skin of your feet, including checking for cuts, bruises, redness, blisters, sores, or other problems.  Screening for blood vessel (vascular) problems, which may include checking the pulse in your legs and feet and checking your temperature. Blood tests Depending on your  treatment plan and your personal needs, you may have the following tests done:  HbA1c (hemoglobin A1c). This test provides information about blood sugar (glucose) control over the previous 2-3 months. It is used to adjust your treatment plan, if needed. This test will be done: ? At least 2 times a year, if you are meeting your treatment goals. ? 4 times a year, if you are not meeting your treatment goals or if treatment goals have changed.  Lipid testing, including total, LDL, and HDL cholesterol and triglyceride levels. ? The goal for LDL is less than 100 mg/dL (5.5 mmol/L). If you are at high risk for complications, the goal is less than 70 mg/dL (3.9 mmol/L). ? The goal for HDL is 40 mg/dL (2.2 mmol/L) or higher for men and 50 mg/dL (2.8 mmol/L) or higher for women. An HDL cholesterol of 60 mg/dL (3.3 mmol/L) or higher gives some protection against heart disease. ? The goal for triglycerides is less than 150 mg/dL (8.3 mmol/L).  Liver function tests.  Kidney function tests.  Thyroid function tests. Dental and eye exams  Visit your dentist two times a year.  If you have type 1 diabetes, your health care provider may recommend an eye exam 3-5 years after you are diagnosed, and then once a year after your first exam. ? For children with type 1 diabetes, a health care provider may recommend an eye exam when your child is age 11 or older and has had diabetes for 3-5 years. After the first exam, your child should get an eye exam once a year.  If you have type 2 diabetes, your health care provider may recommend an  eye exam as soon as you are diagnosed, and then once a year after your first exam. Immunizations   The yearly flu (influenza) vaccine is recommended for everyone 6 months or older who has diabetes.  The pneumonia (pneumococcal) vaccine is recommended for everyone 2 years or older who has diabetes. If you are 53 or older, you may get the pneumonia vaccine as a series of two separate  shots.  The hepatitis B vaccine is recommended for adults shortly after being diagnosed with diabetes.  Adults and children with diabetes should receive all other vaccines according to age-specific recommendations from the Centers for Disease Control and Prevention (CDC). Mental and emotional health Screening for symptoms of eating disorders, anxiety, and depression is recommended at the time of diagnosis and afterward as needed. If your screening shows that you have symptoms (positive screening result), you may need more evaluation and you may work with a mental health care provider. Treatment plan Your treatment plan will be reviewed at every medical visit. You and your health care provider will discuss:  How you are taking your medicines, including insulin.  Any side effects you are experiencing.  Your blood glucose target goals.  The frequency of your blood glucose monitoring.  Lifestyle habits, such as activity level as well as tobacco, alcohol, and substance use. Diabetes self-management education Your health care provider will assess how well you are monitoring your blood glucose levels and whether you are taking your insulin correctly. He or she may refer you to:  A certified diabetes educator to manage your diabetes throughout your life, starting at diagnosis.  A registered dietitian who can create or review your personal nutrition plan.  An exercise specialist who can discuss your activity level and exercise plan. Summary  Managing diabetes (diabetes mellitus) can be complicated. Your diabetes treatment may be managed by a team of health care providers.  Your health care providers follow guidelines in order to help you get the best quality of care.  Standards of care including having regular physical exams, blood tests, blood pressure monitoring, immunizations, screening tests, and education about how to manage your diabetes.  Your health care providers may also give you  more specific instructions based on your individual health. This information is not intended to replace advice given to you by your health care provider. Make sure you discuss any questions you have with your health care provider. Document Released: 04/15/2009 Document Revised: 03/07/2018 Document Reviewed: 03/16/2016 Elsevier Interactive Patient Education  2019 [employer].

## 2018-08-04 ENCOUNTER — Other Ambulatory Visit (HOSPITAL_COMMUNITY): Payer: Self-pay | Admitting: Internal Medicine

## 2018-08-07 ENCOUNTER — Other Ambulatory Visit: Payer: Self-pay | Admitting: Family Medicine

## 2018-08-07 DIAGNOSIS — H9193 Unspecified hearing loss, bilateral: Secondary | ICD-10-CM

## 2018-08-07 NOTE — Progress Notes (Signed)
ent

## 2018-08-08 ENCOUNTER — Other Ambulatory Visit (HOSPITAL_COMMUNITY): Payer: Self-pay | Admitting: Internal Medicine

## 2018-08-08 NOTE — Telephone Encounter (Signed)
Future refills should be sent to Dr Fletcher Anon

## 2018-08-11 ENCOUNTER — Ambulatory Visit (INDEPENDENT_AMBULATORY_CARE_PROVIDER_SITE_OTHER): Payer: Medicare Other | Admitting: *Deleted

## 2018-08-11 ENCOUNTER — Other Ambulatory Visit: Payer: Self-pay

## 2018-08-11 DIAGNOSIS — I480 Paroxysmal atrial fibrillation: Secondary | ICD-10-CM

## 2018-08-11 DIAGNOSIS — Z5181 Encounter for therapeutic drug level monitoring: Secondary | ICD-10-CM | POA: Diagnosis not present

## 2018-08-11 LAB — POCT INR: INR: 2.2 (ref 2.0–3.0)

## 2018-08-11 MED ORDER — CARVEDILOL 3.125 MG PO TABS: ORAL_TABLET | ORAL | 0 refills | Status: DC

## 2018-08-11 NOTE — Patient Instructions (Addendum)
   Description   Continue  taking 1.5 tablets daily except for 1 tablet every Mondays, Wednesdays and Fridays. Repeat INR in 2 weeks.

## 2018-08-14 ENCOUNTER — Other Ambulatory Visit: Payer: Self-pay | Admitting: Family Medicine

## 2018-08-14 DIAGNOSIS — Z794 Long term (current) use of insulin: Principal | ICD-10-CM

## 2018-08-14 DIAGNOSIS — E1152 Type 2 diabetes mellitus with diabetic peripheral angiopathy with gangrene: Secondary | ICD-10-CM

## 2018-08-15 ENCOUNTER — Other Ambulatory Visit: Payer: Self-pay | Admitting: Internal Medicine

## 2018-08-19 ENCOUNTER — Ambulatory Visit (INDEPENDENT_AMBULATORY_CARE_PROVIDER_SITE_OTHER): Payer: Medicare Other | Admitting: Cardiovascular Disease

## 2018-08-19 ENCOUNTER — Encounter: Payer: Self-pay | Admitting: Cardiovascular Disease

## 2018-08-19 VITALS — BP 130/60 | HR 68 | Ht 65.0 in | Wt 175.6 lb

## 2018-08-19 DIAGNOSIS — E785 Hyperlipidemia, unspecified: Secondary | ICD-10-CM

## 2018-08-19 DIAGNOSIS — I739 Peripheral vascular disease, unspecified: Secondary | ICD-10-CM

## 2018-08-19 DIAGNOSIS — Z720 Tobacco use: Secondary | ICD-10-CM | POA: Diagnosis not present

## 2018-08-19 DIAGNOSIS — I2510 Atherosclerotic heart disease of native coronary artery without angina pectoris: Secondary | ICD-10-CM

## 2018-08-19 DIAGNOSIS — I5022 Chronic systolic (congestive) heart failure: Secondary | ICD-10-CM | POA: Diagnosis not present

## 2018-08-19 NOTE — Progress Notes (Signed)
Cardiology Office Note   Date:  08/19/2018   ID:  William [name], DOB 23-Apr-1951, MRN [id number]  PCP:  [name], FNP  Cardiologist:   [name], MD   No chief complaint on file.     History of Present Illness: William [name] is a 68 y.o. male who Is here today for a follow-up visit regarding peripheral arterial disease. He has known history of inferior myocardial infarction complicated by VSD in September, 2017. He is status post one-vessel CABG and VSD repair. He had postoperative atrial fibrillation and has been on anticoagulation. He had amputation of the right great toe while hospitalized for gangrene. The patient has known history of diabetes.  He is known to have peripheral arterial disease. Angiography in November, 2017 showed no significant aortoiliac disease, significant right distal SFA stenosis and one-vessel runoff below the knee via the peroneal artery with reconstitution of the dorsalis pedis distally. I performed successful drug-coated balloon angioplasty of the right SFA. He is s/p right transmetatarsal amputation for osteomyelitis.    He had angiography to the left leg in March 2018 which showed moderate stenosis affecting the left popliteal artery and TP trunk with 1 vessel runoff below the knee via the peroneal artery which was a large dominant vessel and reconstituted the dorsalis pedis and posterior tibial at the level of the ankle. There was brisk flow to the toes. Thus, no revascularization was performed.  He has been doing reasonably well overall with no recent chest pain or worsening dyspnea.  He describes mild bilateral calf claudication.  No open ulceration.  He does have chronic kidney disease followed by nephrology.  He reports that losartan was discontinued due to relatively low blood pressure.  Past Medical History:  Diagnosis Date  . AKI (acute kidney injury) ([place])    With STEMI in 2017  . Chronic systolic CHF (congestive heart failure) ([place])   .  Diabetes mellitus without complication ([place])   . Hx of adenomatous colonic polyps 04/07/2018  . Hyperlipidemia   . Hypertension   . Paroxysmal atrial fibrillation ([name])   . STEMI (ST elevation myocardial infarction) ([place]) 2017    Past Surgical History:  Procedure Laterality Date  . ABDOMINAL AORTOGRAM W/LOWER EXTREMITY N/A 09/19/2016   Procedure: Abdominal Aortogram w/Lower Extremity;  Surgeon: Wellington Hampshire, MD;  Location: [facility];  Service: Cardiovascular;  Laterality: N/A;  . AMPUTATION Right 03/23/2016   Procedure: 1st and 2nd Ray Amputation Right Foot;  Surgeon: Newt Minion, MD;  Location: [facility];  Service: Orthopedics;  Laterality: Right;  . AMPUTATION Right 06/21/2016   Procedure: RIGHT TRANSMETATARSAL AMPUTATION;  Surgeon: Newt Minion, MD;  Location: [name];  Service: Orthopedics;  Laterality: Right;  . CARDIAC CATHETERIZATION N/A 03/13/2016   Procedure: Right/Left Heart Cath and Coronary Angiography;  Surgeon: Sherren Mocha, MD;  Location: [facility];  Service: Cardiovascular;  Laterality: N/A;  . CARDIAC CATHETERIZATION N/A 03/13/2016   Procedure: IABP Insertion;  Surgeon: Sherren Mocha, MD;  Location: [facility];  Service: Cardiovascular;  Laterality: N/A;  . CATARACT EXTRACTION, BILATERAL    . [name]   has had 3 neck surgeries from breaking his neck  . CORONARY ARTERY BYPASS GRAFT N/A 03/13/2016   Procedure: CORONARY ARTERY BYPASS GRAFTING (CABG) x 1 (SVG to OM) with EVH from [place];  Surgeon: Ivin Poot, MD;  Location: [facility];  Service: Open Heart Surgery;  Laterality: N/A;  .  LOWER EXTREMITY ANGIOGRAM  05/02/2016   Procedure: Lower Extremity Angiogram;  Surgeon: Wellington Hampshire, MD;  Location: [facility];  Service: Cardiovascular;;  Limited left femoral runoff right femoral runoff  . PERIPHERAL VASCULAR CATHETERIZATION N/A 05/02/2016   Procedure: Abdominal Aortogram;  Surgeon: Wellington Hampshire,  MD;  Location: [facility];  Service: Cardiovascular;  Laterality: N/A;  . PERIPHERAL VASCULAR CATHETERIZATION Right 05/02/2016   Procedure: Peripheral Vascular Balloon Angioplasty;  Surgeon: Wellington Hampshire, MD;  Location: [facility];  Service: Cardiovascular;  Laterality: Right;  SFA  . TEE WITHOUT CARDIOVERSION N/A 03/13/2016   Procedure: TRANSESOPHAGEAL ECHOCARDIOGRAM (TEE);  Surgeon: Ivin Poot, MD;  Location: [facility];  Service: Open Heart Surgery;  Laterality: N/A;  . VSD REPAIR N/A 03/13/2016   Procedure: VENTRICULAR SEPTAL DEFECT (VSD) REPAIR;  Surgeon: Ivin Poot, MD;  Location: [facility];  Service: Open Heart Surgery;  Laterality: N/A;     Current Outpatient Medications  Medication Sig Dispense Refill  . acetaminophen (TYLENOL ARTHRITIS PAIN) 650 MG CR tablet Take 650 mg by mouth every 8 (eight) hours as needed for pain.    [name] albuterol (PROVENTIL HFA;VENTOLIN HFA) 108 (90 Base) MCG/ACT inhaler Inhale 2 puffs into the lungs every 6 (six) hours as needed for wheezing or shortness of breath. 1 Inhaler 0  . aspirin EC 81 MG tablet Take 1 tablet (81 mg total) by mouth daily. 30 tablet 3  . Blood Glucose Monitoring Suppl (ACCU-CHEK AVIVA PLUS) w/Device KIT 1 each by Does not apply route 4 (four) times daily -  before meals and at bedtime. 1 kit 0  . carvedilol (COREG) 3.125 MG tablet TAKE 1 TABLET BY MOUTH TWICE DAILY. NEED APPT WITH DOCTOR 60 tablet 0  . gabapentin (NEURONTIN) 300 MG capsule TAKE 1 CAPSULE BY MOUTH THREE TIMES DAILY AS NEEDED FOR NEUROPATHY OR PAIN 90 capsule 0  . glucose blood (ACCU-CHEK AVIVA) test strip Check blood sugars three times per day prior to meals and bedtime 360 each 5  . Insulin Glargine (LANTUS SOLOSTAR) 100 UNIT/ML Solostar Pen Inject 45 Units into the skin at bedtime. 5 pen 11  . insulin lispro (HUMALOG) 100 UNIT/ML KiwkPen Inject insulin subcutaneously according sliding scale 15 mL 11  . Insulin Pen Needle 31G X 8 MM MISC 1 each by Does  not apply route 4 (four) times daily. 100 each 12  . Lancets (ACCU-CHEK SOFT TOUCH) lancets Use as instructed 100 each 12  . nitroGLYCERIN (NITRODUR - DOSED IN MG/24 HR) 0.4 mg/hr patch Place 1 patch (0.4 mg total) onto the skin daily. 30 patch 3  . pantoprazole (PROTONIX) 40 MG tablet TAKE 1 TABLET BY MOUTH TWICE DAILY BEFORE MEAL 30 MINUTES BEFORE BREAKFAST AND SUPPER 60 tablet 5  . pramipexole (MIRAPEX) 0.125 MG tablet TAKE 1 TABLET(0.125 MG) BY MOUTH AT BEDTIME 90 tablet 0  . rosuvastatin (CRESTOR) 40 MG tablet Take 1 tablet (40 mg total) by mouth daily. 90 tablet 3  . silver sulfADIAZINE (SILVADENE) 1 % cream Apply 1 application topically daily. Apply to affected area daily plus dry dressing 400 g 3  . SSD 1 % cream Apply topically daily. 50 g 1  . torsemide (DEMADEX) 20 MG tablet TAKE 1 TABLET(20 MG) BY MOUTH 3 TIMES A WEEK 15 tablet 0  . warfarin (COUMADIN) 5 MG tablet Take 1 and 1 and 1/2 tablets by mouth daily as directed by coumadin clinic 180 tablet 0   No current facility-administered medications  for this visit.     Allergies:   Morphine and related and Latex    Social History:  The patient  reports that he quit smoking about 2 years ago. His smoking use included cigarettes. He smoked 0.50 packs per day. He has never used smokeless tobacco. He reports that he does not drink alcohol or use drugs.   Family History:  Not able to obtain due to distress.  ROS:  Please see the history of present illness.   Otherwise, review of systems are positive for none.   All other systems are reviewed and negative.    PHYSICAL EXAM: VS:  BP 130/60   Pulse 68   Ht '5\' 5"'  (1.651 m)   Wt 175 lb 9.6 oz (79.7 kg)   BMI 29.22 kg/m  , BMI Body mass index is 29.22 kg/m. GEN: Well nourished, well developed, in no acute distress  HEENT: normal  Neck: no JVD, carotid bruits, or masses Cardiac: RRR; no  rubs, or gallops,no edema . No murmurs Respiratory:  clear to auscultation bilaterally, normal  work of breathing GI: soft, nontender, nondistended, + BS MS: no deformity or atrophy  Skin: warm and dry, no rash Neuro:  Strength and sensation are intact Psych: euthymic mood, full affect Vascular: Distal pulses are not palpable.  No ulceration.  EKG:  EKG is  ordered today. EKG showed normal sinus rhythm with prior inferior infarct and poor R-wave progression in the precordial leads.   Recent Labs: 03/10/2018: ALT 35; BUN 35; Creatinine, Ser 1.58; Hemoglobin 12.3; Platelets 153; Potassium 4.8; Sodium 143    Lipid Panel    Component Value Date/Time   CHOL 98 03/20/2017 1144   TRIG 116 03/20/2017 1144   HDL 33 (L) 03/20/2017 1144   CHOLHDL 3.0 03/20/2017 1144   VLDL 29 03/13/2016 1057   LDLCALC 45 03/20/2017 1144      Wt Readings from Last 3 Encounters:  08/19/18 175 lb 9.6 oz (79.7 kg)  07/28/18 177 lb (80.3 kg)  06/16/18 175 lb (79.4 kg)      No flowsheet data found.    ASSESSMENT AND PLAN:  1.  Peripheral arterial disease :  He is status post recent drug-coated balloon angioplasty of the right SFA in 2017 . He has significant tibial disease bilaterally. Currently he has no ulceration or claudication. Continue medical therapy. I requested a follow-up ABI and lower extremity arterial duplex.  2. Chronic systolic heart failure: He appears to be euvolemic.   Continue small dose carvedilol.  3.paroxysmal atrial fibrillation: He is maintaining in sinus rhythm.  He is on long-term anticoagulation with warfarin with no bleeding complications.  4. Coronary artery disease involving native coronary arteries without angina:  status post one-vessel CABG and VSD repair. Continue medical therapy.  5. Diabetes mellitus: Improved from before.  6. Hyperlipidemia: Continue high-dose rosuvastatin.   Disposition:   follow-up with me in 6 months  Signed,  [name], MD  08/19/2018 11:38 AM    [place]

## 2018-08-19 NOTE — Patient Instructions (Signed)
Medication Instructions:  No changes If you need a refill on your cardiac medications before your next appointment, please call your pharmacy.   Lab work: None ordered  Testing/Procedures: Your physician has requested that you have an ankle brachial index (ABI). During this test an ultrasound and blood pressure cuff are used to evaluate the arteries that supply the arms and legs with blood. Allow thirty minutes for this exam. There are no restrictions or special instructions..  Your physician has requested that you have a lower extremity arterial duplex. During this test, ultrasound is used to evaluate arterial blood flow in the legs. Allow one hour for this exam. There are no restrictions or special instructions.    Follow-Up: At [facility], you and your health needs are our priority.  As part of our continuing mission to provide you with exceptional heart care, we have created designated Provider Care Teams.  These Care Teams include your primary Cardiologist (physician) and Advanced Practice Providers (APPs -  Physician Assistants and Nurse Practitioners) who all work together to provide you with the care you need, when you need it. You will need a follow up appointment in 6 months.  Please call our office 2 months in advance to schedule this appointment.  You may see DR. [name] or one of the following Electrical engineer on your designated Care Team:   Kerin Ransom, PA-C [place], [place] . Sande Rives, PA-C

## 2018-08-25 ENCOUNTER — Ambulatory Visit (INDEPENDENT_AMBULATORY_CARE_PROVIDER_SITE_OTHER): Payer: Medicare Other | Admitting: *Deleted

## 2018-08-25 DIAGNOSIS — I480 Paroxysmal atrial fibrillation: Secondary | ICD-10-CM

## 2018-08-25 DIAGNOSIS — Z5181 Encounter for therapeutic drug level monitoring: Secondary | ICD-10-CM | POA: Diagnosis not present

## 2018-08-25 LAB — POCT INR: INR: 1.4 — AB (ref 2.0–3.0)

## 2018-08-25 NOTE — Patient Instructions (Signed)
Description   Today take 2 tablets, then Continue  taking 1.5 tablets daily except for 1 tablet every Mondays, Wednesdays and Fridays. Repeat INR in 2 weeks.

## 2018-09-01 ENCOUNTER — Ambulatory Visit (INDEPENDENT_AMBULATORY_CARE_PROVIDER_SITE_OTHER): Payer: Medicare Other | Admitting: Otolaryngology

## 2018-09-01 DIAGNOSIS — H903 Sensorineural hearing loss, bilateral: Secondary | ICD-10-CM

## 2018-09-01 DIAGNOSIS — H9313 Tinnitus, bilateral: Secondary | ICD-10-CM | POA: Diagnosis not present

## 2018-09-05 ENCOUNTER — Other Ambulatory Visit: Payer: Self-pay | Admitting: Family Medicine

## 2018-09-05 DIAGNOSIS — G2581 Restless legs syndrome: Secondary | ICD-10-CM

## 2018-09-08 ENCOUNTER — Other Ambulatory Visit: Payer: Self-pay | Admitting: Pharmacist Clinician (PhC)/ Clinical Pharmacy Specialist

## 2018-09-08 ENCOUNTER — Other Ambulatory Visit (HOSPITAL_COMMUNITY): Payer: Self-pay | Admitting: Internal Medicine

## 2018-09-08 ENCOUNTER — Ambulatory Visit (HOSPITAL_COMMUNITY)
Admission: RE | Admit: 2018-09-08 | Discharge: 2018-09-08 | Disposition: A | Discharge status: Home / Self Care | Payer: Medicare Other | Source: Ambulatory Visit | Attending: Cardiovascular Disease | Admitting: Cardiovascular Disease

## 2018-09-08 ENCOUNTER — Ambulatory Visit (INDEPENDENT_AMBULATORY_CARE_PROVIDER_SITE_OTHER): Payer: Medicare Other | Admitting: Pharmacist Clinician (PhC)/ Clinical Pharmacy Specialist

## 2018-09-08 DIAGNOSIS — Z5181 Encounter for therapeutic drug level monitoring: Secondary | ICD-10-CM

## 2018-09-08 DIAGNOSIS — I739 Peripheral vascular disease, unspecified: Secondary | ICD-10-CM | POA: Diagnosis not present

## 2018-09-08 DIAGNOSIS — I480 Paroxysmal atrial fibrillation: Secondary | ICD-10-CM | POA: Diagnosis not present

## 2018-09-08 LAB — POCT INR: INR: 1.5 — AB (ref 2.0–3.0)

## 2018-09-08 MED ORDER — WARFARIN SODIUM 5 MG PO TABS: ORAL_TABLET | ORAL | 0 refills | Status: DC

## 2018-09-11 ENCOUNTER — Other Ambulatory Visit: Payer: Self-pay

## 2018-09-11 ENCOUNTER — Telehealth: Payer: Self-pay | Admitting: *Deleted

## 2018-09-11 DIAGNOSIS — I739 Peripheral vascular disease, unspecified: Secondary | ICD-10-CM

## 2018-09-11 MED ORDER — CARVEDILOL 3.125 MG PO TABS: 3.1250 mg | ORAL_TABLET | Freq: Two times a day (BID) | ORAL | 3 refills | Status: DC

## 2018-09-11 NOTE — Telephone Encounter (Signed)
Rx(s) sent to pharmacy electronically.  

## 2018-09-11 NOTE — Telephone Encounter (Signed)
Patient's daughter made aware of results, per dpr, and verbalized her understanding.   Repeat orders have been placed.

## 2018-09-11 NOTE — Telephone Encounter (Signed)
-----   Message from Wellington Hampshire, MD sent at 09/11/2018  2:25 PM EDT ----- Normal ABI and patent vessels on the right side post angioplasty.  Repeat same studies in 1 year.

## 2018-09-14 ENCOUNTER — Other Ambulatory Visit: Payer: Self-pay | Admitting: Family Medicine

## 2018-09-14 DIAGNOSIS — E1152 Type 2 diabetes mellitus with diabetic peripheral angiopathy with gangrene: Secondary | ICD-10-CM

## 2018-09-14 DIAGNOSIS — Z794 Long term (current) use of insulin: Principal | ICD-10-CM

## 2018-09-15 ENCOUNTER — Ambulatory Visit: Payer: Medicare Other | Admitting: Family Medicine

## 2018-09-29 ENCOUNTER — Encounter: Payer: Self-pay | Admitting: Family Medicine

## 2018-09-29 ENCOUNTER — Other Ambulatory Visit: Payer: Self-pay

## 2018-09-29 ENCOUNTER — Ambulatory Visit (INDEPENDENT_AMBULATORY_CARE_PROVIDER_SITE_OTHER): Payer: Medicare Other | Admitting: Family Medicine

## 2018-09-29 VITALS — BP 136/82 | HR 78 | Temp 98.0°F | Ht 65.0 in | Wt 180.0 lb

## 2018-09-29 DIAGNOSIS — N183 Chronic kidney disease, stage 3 unspecified: Secondary | ICD-10-CM

## 2018-09-29 DIAGNOSIS — I4819 Other persistent atrial fibrillation: Secondary | ICD-10-CM

## 2018-09-29 DIAGNOSIS — E1152 Type 2 diabetes mellitus with diabetic peripheral angiopathy with gangrene: Secondary | ICD-10-CM | POA: Diagnosis not present

## 2018-09-29 DIAGNOSIS — H9193 Unspecified hearing loss, bilateral: Secondary | ICD-10-CM | POA: Diagnosis not present

## 2018-09-29 DIAGNOSIS — Z794 Long term (current) use of insulin: Secondary | ICD-10-CM

## 2018-09-29 DIAGNOSIS — E785 Hyperlipidemia, unspecified: Secondary | ICD-10-CM

## 2018-09-29 LAB — POCT GLYCOSYLATED HEMOGLOBIN (HGB A1C): Hemoglobin A1C: 7.6 % — AB (ref 4.0–5.6)

## 2018-09-29 LAB — POCT URINALYSIS DIP (MANUAL ENTRY)
Bilirubin, UA: NEGATIVE
Blood, UA: NEGATIVE
Glucose, UA: NEGATIVE mg/dL
Ketones, POC UA: NEGATIVE mg/dL
Leukocytes, UA: NEGATIVE
Nitrite, UA: NEGATIVE
Protein Ur, POC: NEGATIVE mg/dL
Spec Grav, UA: 1.025 (ref 1.010–1.025)
Urobilinogen, UA: 0.2 E.U./dL
pH, UA: 5 (ref 5.0–8.0)

## 2018-09-29 NOTE — Patient Instructions (Signed)
Diabetes Mellitus and Nutrition, Adult  When you have diabetes (diabetes mellitus), it is very important to have healthy eating habits because your blood sugar (glucose) levels are greatly affected by what you eat and drink. Eating healthy foods in the appropriate amounts, at about the same times every day, can help you:  · Control your blood glucose.  · Lower your risk of heart disease.  · Improve your blood pressure.  · Reach or maintain a healthy weight.  Every person with diabetes is different, and each person has different needs for a meal plan. Your health care provider may recommend that you work with a diet and nutrition specialist (dietitian) to make a meal plan that is best for you. Your meal plan may vary depending on factors such as:  · The calories you need.  · The medicines you take.  · Your weight.  · Your blood glucose, blood pressure, and cholesterol levels.  · Your activity level.  · Other health conditions you have, such as heart or kidney disease.  How do carbohydrates affect me?  Carbohydrates, also called carbs, affect your blood glucose level more than any other type of food. Eating carbs naturally raises the amount of glucose in your blood. Carb counting is a method for keeping track of how many carbs you eat. Counting carbs is important to keep your blood glucose at a healthy level, especially if you use insulin or take certain oral diabetes medicines.  It is important to know how many carbs you can safely have in each meal. This is different for every person. Your dietitian can help you calculate how many carbs you should have at each meal and for each snack.  Foods that contain carbs include:  · Bread, cereal, rice, pasta, and crackers.  · Potatoes and corn.  · Peas, beans, and lentils.  · Milk and yogurt.  · Fruit and juice.  · Desserts, such as cakes, cookies, ice cream, and candy.  How does alcohol affect me?  Alcohol can cause a sudden decrease in blood glucose (hypoglycemia),  especially if you use insulin or take certain oral diabetes medicines. Hypoglycemia can be a life-threatening condition. Symptoms of hypoglycemia (sleepiness, dizziness, and confusion) are similar to symptoms of having too much alcohol.  If your health care provider says that alcohol is safe for you, follow these guidelines:  · Limit alcohol intake to no more than 1 drink per day for nonpregnant women and 2 drinks per day for men. One drink equals 12 oz of beer, 5 oz of wine, or 1½ oz of hard liquor.  · Do not drink on an empty stomach.  · Keep yourself hydrated with water, diet soda, or unsweetened iced tea.  · Keep in mind that regular soda, juice, and other mixers may contain a lot of sugar and must be counted as carbs.  What are tips for following this plan?    Reading food labels  · Start by checking the serving size on the "Nutrition Facts" label of packaged foods and drinks. The amount of calories, carbs, fats, and other nutrients listed on the label is based on one serving of the item. Many items contain more than one serving per package.  · Check the total grams (g) of carbs in one serving. You can calculate the number of servings of carbs in one serving by dividing the total carbs by 15. For example, if a food has 30 g of total carbs, it would be equal to 2   servings of carbs.  · Check the number of grams (g) of saturated and trans fats in one serving. Choose foods that have low or no amount of these fats.  · Check the number of milligrams (mg) of salt (sodium) in one serving. Most people should limit total sodium intake to less than 2,300 mg per day.  · Always check the nutrition information of foods labeled as "low-fat" or "nonfat". These foods may be higher in added sugar or refined carbs and should be avoided.  · Talk to your dietitian to identify your daily goals for nutrients listed on the label.  Shopping  · Avoid buying canned, premade, or processed foods. These foods tend to be high in fat, sodium,  and added sugar.  · Shop around the outside edge of the grocery store. This includes fresh fruits and vegetables, bulk grains, fresh meats, and fresh dairy.  Cooking  · Use low-heat cooking methods, such as baking, instead of high-heat cooking methods like deep frying.  · Cook using healthy oils, such as olive, canola, or sunflower oil.  · Avoid cooking with butter, cream, or high-fat meats.  Meal planning  · Eat meals and snacks regularly, preferably at the same times every day. Avoid going long periods of time without eating.  · Eat foods high in fiber, such as fresh fruits, vegetables, beans, and whole grains. Talk to your dietitian about how many servings of carbs you can eat at each meal.  · Eat 4-6 ounces (oz) of lean protein each day, such as lean meat, chicken, fish, eggs, or tofu. One oz of lean protein is equal to:  ? 1 oz of meat, chicken, or fish.  ? 1 egg.  ? ¼ cup of tofu.  · Eat some foods each day that contain healthy fats, such as avocado, nuts, seeds, and fish.  Lifestyle  · Check your blood glucose regularly.  · Exercise regularly as told by your health care provider. This may include:  ? 150 minutes of moderate-intensity or vigorous-intensity exercise each week. This could be brisk walking, biking, or water aerobics.  ? Stretching and doing strength exercises, such as yoga or weightlifting, at least 2 times a week.  · Take medicines as told by your health care provider.  · Do not use any products that contain nicotine or tobacco, such as cigarettes and e-cigarettes. If you need help quitting, ask your health care provider.  · Work with a counselor or diabetes educator to identify strategies to manage stress and any emotional and social challenges.  Questions to ask a health care provider  · Do I need to meet with a diabetes educator?  · Do I need to meet with a dietitian?  · What number can I call if I have questions?  · When are the best times to check my blood glucose?  Where to find more  information:  · American Diabetes Association: diabetes.org  · Academy of Nutrition and Dietetics: www.eatright.org  · National Institute of Diabetes and Digestive and Kidney Diseases (NIH): www.niddk.nih.gov  Summary  · A healthy meal plan will help you control your blood glucose and maintain a healthy lifestyle.  · Working with a diet and nutrition specialist (dietitian) can help you make a meal plan that is best for you.  · Keep in mind that carbohydrates (carbs) and alcohol have immediate effects on your blood glucose levels. It is important to count carbs and to use alcohol carefully.  This information is not intended to   replace advice given to you by your health care provider. Make sure you discuss any questions you have with your health care provider.  Document Released: 03/15/2005 Document Revised: 01/16/2017 Document Reviewed: 07/23/2016  Elsevier Interactive Patient Education © 2019 Elsevier Inc.

## 2018-09-29 NOTE — Progress Notes (Signed)
Patient [name]   Progress Note: General Provider: [name], FNP  SUBJECTIVE:   William [name] is a 68 y.o. male who  has a past medical history of AKI (acute kidney injury) ([place]), Chronic systolic CHF (congestive heart failure) ([place]), Diabetes mellitus without complication ([place]), adenomatous colonic polyps (04/07/2018), Hyperlipidemia, Hypertension, Paroxysmal atrial fibrillation ([place]), and STEMI (ST elevation myocardial infarction) ([place]) (2017).. Patient presents today for Follow-up (chronic condition )  Completely deaf in the right ear and half in the left. He said that he is waiting on hearing aids. Was seen by nephrology for CKD. He reports compliance with medications. He is watching his carb intake. Also reports walking for exercise.    Review of Systems  Constitutional: Negative.   HENT: Positive for hearing loss (hearing aid pending. ).   Eyes: Negative.   Respiratory: Negative.   Cardiovascular: Negative.   Gastrointestinal: Negative.   Genitourinary: Negative.   Musculoskeletal: Negative.   Skin: Negative.   Neurological: Negative.   Psychiatric/Behavioral: Negative.      OBJECTIVE: BP 136/82 (BP Location: Left Arm, Patient Position: Sitting, Cuff Size: Small)   Pulse 78   Temp 98 F (36.7 C) (Oral)   Ht 5\' 5"  (1.651 m)   Wt 180 lb (81.6 kg)   SpO2 100%   BMI 29.95 kg/m   Wt Readings from Last 3 Encounters:  09/29/18 180 lb (81.6 kg)  08/19/18 175 lb 9.6 oz (79.7 kg)  07/28/18 177 lb (80.3 kg)     Physical Exam Vitals signs and nursing note reviewed.  Constitutional:      General: He is not in acute distress.    Appearance: Normal appearance.  HENT:     Head: Normocephalic and atraumatic.  Eyes:     Extraocular Movements: Extraocular movements intact.     Conjunctiva/sclera: Conjunctivae normal.     Pupils: Pupils are equal, round, and reactive to light.  Cardiovascular:     Rate and Rhythm: Normal rate  and regular rhythm.     Heart sounds: No murmur.  Pulmonary:     Effort: Pulmonary effort is normal.     Breath sounds: Normal breath sounds.  Musculoskeletal: Normal range of motion.  Skin:    General: Skin is warm and dry.  Neurological:     Mental Status: He is alert and oriented to person, place, and time.  Psychiatric:        Mood and Affect: Mood normal.        Behavior: Behavior normal.        Thought Content: Thought content normal.        Judgment: Judgment normal.     ASSESSMENT/PLAN:  1. Type 2 diabetes mellitus with diabetic peripheral angiopathy and gangrene, with long-term current use of insulin ([name]) Continue with current medications.  - POCT glycosylated hemoglobin (Hb A1C)- 7.6 today. This is a decrease but not at goal. Continue with diet and exercise.  - POCT urinalysis dipstick  - gabapentin (NEURONTIN) 300 MG capsule; Take 1 capsule (300 mg total) by mouth 3 (three) times daily.  Dispense: 90 capsule; Refill: 2  2. Other persistent atrial fibrillation  3. Decreased hearing of both ears  4. CKD (chronic kidney disease) stage 3, GFR 30-59 ml/min ([name])  5. Hyperlipidemia, unspecified hyperlipidemia type Patient is being followed by a specialist for CKD and hearing loss. . Patient advised to continue with follow up appointments. PCP will continue to monitor progress.    The patient  was given clear instructions to go to ER or return to medical center if symptoms do not improve, worsen or new problems develop. The patient verbalized understanding and agreed with plan of care.   Ms. Doug Sou. Nathaneil Canary, FNP-BC Patient [facility] [address] [place], [place] [address] [phone]

## 2018-10-11 ENCOUNTER — Other Ambulatory Visit (HOSPITAL_COMMUNITY): Payer: Self-pay | Admitting: Cardiovascular Disease

## 2018-10-13 NOTE — Telephone Encounter (Signed)
Rx(s) sent to pharmacy electronically.  

## 2018-10-13 NOTE — Telephone Encounter (Signed)
Refill Request.  

## 2018-10-20 ENCOUNTER — Other Ambulatory Visit: Payer: Self-pay | Admitting: Family Medicine

## 2018-10-20 DIAGNOSIS — E1152 Type 2 diabetes mellitus with diabetic peripheral angiopathy with gangrene: Secondary | ICD-10-CM

## 2018-10-20 DIAGNOSIS — Z794 Long term (current) use of insulin: Principal | ICD-10-CM

## 2018-10-20 MED ORDER — GABAPENTIN 300 MG PO CAPS: 300.0000 mg | ORAL_CAPSULE | Freq: Three times a day (TID) | ORAL | 2 refills | Status: DC

## 2018-11-14 ENCOUNTER — Other Ambulatory Visit: Payer: Self-pay | Admitting: Family Medicine

## 2018-11-14 DIAGNOSIS — E1152 Type 2 diabetes mellitus with diabetic peripheral angiopathy with gangrene: Secondary | ICD-10-CM

## 2018-11-17 ENCOUNTER — Telehealth: Payer: Self-pay

## 2018-11-17 NOTE — Telephone Encounter (Signed)

## 2018-11-19 ENCOUNTER — Ambulatory Visit (INDEPENDENT_AMBULATORY_CARE_PROVIDER_SITE_OTHER): Payer: Medicare Other

## 2018-11-19 ENCOUNTER — Other Ambulatory Visit: Payer: Self-pay

## 2018-11-19 DIAGNOSIS — Z5181 Encounter for therapeutic drug level monitoring: Secondary | ICD-10-CM | POA: Diagnosis not present

## 2018-11-19 DIAGNOSIS — I480 Paroxysmal atrial fibrillation: Secondary | ICD-10-CM | POA: Diagnosis not present

## 2018-11-19 LAB — POCT INR: INR: 2.6 (ref 2.0–3.0)

## 2018-11-19 NOTE — Patient Instructions (Signed)
Description   Called spoke with pt's daughter, who does pt's medications, advised to have pt take 1 tablet today, then resume same dosage 1.5 tablets daily except for 1 tablet every Mondays and Fridays.  Recheck INR in 2 weeks.

## 2018-11-27 ENCOUNTER — Telehealth: Payer: Self-pay

## 2018-11-27 NOTE — Telephone Encounter (Signed)
lmom for prescreen  

## 2018-11-28 ENCOUNTER — Other Ambulatory Visit: Payer: Self-pay

## 2018-11-28 ENCOUNTER — Ambulatory Visit (INDEPENDENT_AMBULATORY_CARE_PROVIDER_SITE_OTHER): Payer: Medicare Other | Admitting: *Deleted

## 2018-11-28 DIAGNOSIS — I480 Paroxysmal atrial fibrillation: Secondary | ICD-10-CM

## 2018-11-28 DIAGNOSIS — Z5181 Encounter for therapeutic drug level monitoring: Secondary | ICD-10-CM

## 2018-11-28 LAB — POCT INR: INR: 2.2 (ref 2.0–3.0)

## 2018-11-28 NOTE — Patient Instructions (Signed)
Description   Spoke with Michelle-pt's daughter-on DPR and advised to have pt to continue taking 1.5 tablets daily except for 1 tablet every Mondays and Fridays.  Recheck INR in 3 weeks-dtr having a procedure and can only bring him after 12/20/2018.

## 2018-12-02 ENCOUNTER — Telehealth: Payer: Self-pay | Admitting: *Deleted

## 2018-12-02 NOTE — Telephone Encounter (Signed)
William [name]'s caregiver, ask if we would call back the end of July.

## 2018-12-04 ENCOUNTER — Other Ambulatory Visit: Payer: Self-pay | Admitting: Family Medicine

## 2018-12-04 DIAGNOSIS — G2581 Restless legs syndrome: Secondary | ICD-10-CM

## 2018-12-15 ENCOUNTER — Telehealth: Payer: Self-pay

## 2018-12-15 ENCOUNTER — Other Ambulatory Visit: Payer: Self-pay | Admitting: Family Medicine

## 2018-12-15 DIAGNOSIS — Z794 Long term (current) use of insulin: Secondary | ICD-10-CM

## 2018-12-15 DIAGNOSIS — E1152 Type 2 diabetes mellitus with diabetic peripheral angiopathy with gangrene: Secondary | ICD-10-CM

## 2018-12-15 NOTE — Telephone Encounter (Signed)

## 2018-12-24 ENCOUNTER — Other Ambulatory Visit: Payer: Self-pay

## 2018-12-24 ENCOUNTER — Ambulatory Visit (INDEPENDENT_AMBULATORY_CARE_PROVIDER_SITE_OTHER): Payer: Medicare Other | Admitting: Pharmacist Clinician (PhC)/ Clinical Pharmacy Specialist

## 2018-12-24 DIAGNOSIS — Z5181 Encounter for therapeutic drug level monitoring: Secondary | ICD-10-CM | POA: Diagnosis not present

## 2018-12-24 DIAGNOSIS — I480 Paroxysmal atrial fibrillation: Secondary | ICD-10-CM | POA: Diagnosis not present

## 2018-12-24 LAB — POCT INR: INR: 1.9 — AB (ref 2.0–3.0)

## 2019-01-05 ENCOUNTER — Other Ambulatory Visit: Payer: Self-pay

## 2019-01-05 ENCOUNTER — Ambulatory Visit (INDEPENDENT_AMBULATORY_CARE_PROVIDER_SITE_OTHER): Payer: Medicare Other | Admitting: Family Medicine

## 2019-01-05 VITALS — BP 109/61 | HR 75 | Resp 18 | Ht 65.0 in | Wt 186.8 lb

## 2019-01-05 DIAGNOSIS — Z794 Long term (current) use of insulin: Secondary | ICD-10-CM | POA: Diagnosis not present

## 2019-01-05 DIAGNOSIS — E1152 Type 2 diabetes mellitus with diabetic peripheral angiopathy with gangrene: Secondary | ICD-10-CM | POA: Diagnosis not present

## 2019-01-05 DIAGNOSIS — N183 Chronic kidney disease, stage 3 unspecified: Secondary | ICD-10-CM

## 2019-01-05 DIAGNOSIS — I739 Peripheral vascular disease, unspecified: Secondary | ICD-10-CM

## 2019-01-05 LAB — POCT GLYCOSYLATED HEMOGLOBIN (HGB A1C): Hemoglobin A1C: 8.5 % — AB (ref 4.0–5.6)

## 2019-01-05 NOTE — Patient Instructions (Signed)
Diabetes Mellitus and Standards of Medical Care Managing diabetes (diabetes mellitus) can be complicated. Your diabetes treatment may be managed by a team of health care providers, including:  A physician who specializes in diabetes (endocrinologist).  A nurse practitioner or physician assistant.  Nurses.  A diet and nutrition specialist (registered dietitian).  A certified diabetes educator (CDE).  An exercise specialist.  A pharmacist.  An eye doctor.  A foot specialist (podiatrist).  A dentist.  A primary care provider.  A mental health provider. Your health care providers follow guidelines to help you get the best quality of care. The following schedule is a general guideline for your diabetes management plan. Your health care providers may give you more specific instructions. Physical exams Upon being diagnosed with diabetes mellitus, and each year after that, your health care provider will ask about your medical and family history. He or she will also do a physical exam. Your exam may include:  Measuring your height, weight, and body mass index (BMI).  Checking your blood pressure. This will be done at every routine medical visit. Your target blood pressure may vary depending on your medical conditions, your age, and other factors.  Thyroid gland exam.  Skin exam.  Screening for damage to your nerves (peripheral neuropathy). This may include checking the pulse in your legs and feet and checking the level of sensation in your hands and feet.  A complete foot exam to inspect the structure and skin of your feet, including checking for cuts, bruises, redness, blisters, sores, or other problems.  Screening for blood vessel (vascular) problems, which may include checking the pulse in your legs and feet and checking your temperature. Blood tests Depending on your treatment plan and your personal needs, you may have the following tests done:  HbA1c (hemoglobin A1c). This  test provides information about blood sugar (glucose) control over the previous 2-3 months. It is used to adjust your treatment plan, if needed. This test will be done: ? At least 2 times a year, if you are meeting your treatment goals. ? 4 times a year, if you are not meeting your treatment goals or if treatment goals have changed.  Lipid testing, including total, LDL, and HDL cholesterol and triglyceride levels. ? The goal for LDL is less than 100 mg/dL (5.5 mmol/L). If you are at high risk for complications, the goal is less than 70 mg/dL (3.9 mmol/L). ? The goal for HDL is 40 mg/dL (2.2 mmol/L) or higher for men and 50 mg/dL (2.8 mmol/L) or higher for women. An HDL cholesterol of 60 mg/dL (3.3 mmol/L) or higher gives some protection against heart disease. ? The goal for triglycerides is less than 150 mg/dL (8.3 mmol/L).  Liver function tests.  Kidney function tests.  Thyroid function tests. Dental and eye exams  Visit your dentist two times a year.  If you have type 1 diabetes, your health care provider may recommend an eye exam 3-5 years after you are diagnosed, and then once a year after your first exam. ? For children with type 1 diabetes, a health care provider may recommend an eye exam when your child is age 10 or older and has had diabetes for 3-5 years. After the first exam, your child should get an eye exam once a year.  If you have type 2 diabetes, your health care provider may recommend an eye exam as soon as you are diagnosed, and then once a year after your first exam. Immunizations   The  yearly flu (influenza) vaccine is recommended for everyone 6 months or older who has diabetes.  The pneumonia (pneumococcal) vaccine is recommended for everyone 2 years or older who has diabetes. If you are 11 or older, you may get the pneumonia vaccine as a series of two separate shots.  The hepatitis B vaccine is recommended for adults shortly after being diagnosed with diabetes.   Adults and children with diabetes should receive all other vaccines according to age-specific recommendations from the Centers for Disease Control and Prevention (CDC). Mental and emotional health Screening for symptoms of eating disorders, anxiety, and depression is recommended at the time of diagnosis and afterward as needed. If your screening shows that you have symptoms (positive screening result), you may need more evaluation and you may work with a mental health care provider. Treatment plan Your treatment plan will be reviewed at every medical visit. You and your health care provider will discuss:  How you are taking your medicines, including insulin.  Any side effects you are experiencing.  Your blood glucose target goals.  The frequency of your blood glucose monitoring.  Lifestyle habits, such as activity level as well as tobacco, alcohol, and substance use. Diabetes self-management education Your health care provider will assess how well you are monitoring your blood glucose levels and whether you are taking your insulin correctly. He or she may refer you to:  A certified diabetes educator to manage your diabetes throughout your life, starting at diagnosis.  A registered dietitian who can create or review your personal nutrition plan.  An exercise specialist who can discuss your activity level and exercise plan. Summary  Managing diabetes (diabetes mellitus) can be complicated. Your diabetes treatment may be managed by a team of health care providers.  Your health care providers follow guidelines in order to help you get the best quality of care.  Standards of care including having regular physical exams, blood tests, blood pressure monitoring, immunizations, screening tests, and education about how to manage your diabetes.  Your health care providers may also give you more specific instructions based on your individual health. This information is not intended to replace  advice given to you by your health care provider. Make sure you discuss any questions you have with your health care provider. Document Released: 04/15/2009 Document Revised: 03/07/2018 Document Reviewed: 03/16/2016 Elsevier Patient Education  2020 [employer].

## 2019-01-05 NOTE — Progress Notes (Signed)
[name]   Progress Note: General Provider: [name], FNP  SUBJECTIVE:   William [name] is a 68 y.o. male who  has a past medical history of AKI (acute kidney injury) ([place]), Chronic systolic CHF (congestive heart failure) ([place]), Diabetes mellitus without complication ([place]), adenomatous colonic polyps (04/07/2018), Hyperlipidemia, Hypertension, Paroxysmal atrial fibrillation ([place]), and STEMI (ST elevation myocardial infarction) ([place]) (2017).. Patient presents today for follow up on diabetes.  Patient reports compliance with medications, diet and exercise. He has not been as active since being under COVID quarantine. He reports daily glucose monitoring. States that he has noticed an increase in his ranges with the highest FBS being 215. Denies hypoglycemic episodes.    Review of Systems  Constitutional: Negative.   HENT: Negative.   Eyes: Negative.   Respiratory: Negative.   Cardiovascular: Negative.   Gastrointestinal: Negative.   Genitourinary: Negative.   Musculoskeletal: Negative.   Skin: Negative.   Neurological: Negative.   Psychiatric/Behavioral: Negative.      OBJECTIVE: BP 109/61 (BP Location: Left Arm, Patient Position: Sitting)   Pulse 75   Resp 18   Ht 5\' 5"  (1.651 m)   Wt 186 lb 12.8 oz (84.7 kg)   SpO2 100%   BMI 31.09 kg/m   Wt Readings from Last 3 Encounters:  01/05/19 186 lb 12.8 oz (84.7 kg)  09/29/18 180 lb (81.6 kg)  08/19/18 175 lb 9.6 oz (79.7 kg)     Physical Exam Vitals signs and nursing note reviewed.  Constitutional:      General: He is not in acute distress.    Appearance: Normal appearance.  HENT:     Head: Normocephalic and atraumatic.  Eyes:     Extraocular Movements: Extraocular movements intact.     Conjunctiva/sclera: Conjunctivae normal.     Pupils: Pupils are equal, round, and reactive to light.  Cardiovascular:     Rate and Rhythm: Normal rate and regular rhythm.     Heart  sounds: No murmur.  Pulmonary:     Effort: Pulmonary effort is normal.     Breath sounds: Normal breath sounds.  Musculoskeletal: Normal range of motion.  Skin:    General: Skin is warm and dry.  Neurological:     Mental Status: He is alert and oriented to person, place, and time.  Psychiatric:        Mood and Affect: Mood normal.        Behavior: Behavior normal.        Thought Content: Thought content normal.        Judgment: Judgment normal.     ASSESSMENT/PLAN: 1. Type 2 diabetes mellitus with diabetic peripheral angiopathy and gangrene, with long-term current use of insulin ([name]) - Lipid Panel - Comprehensive metabolic panel - POCT glycosylated hemoglobin (Hb A1C) - Microalbumin/Creatinine Ratio, Urine 2. Peripheral vascular disorder ([place]) 3. CKD (chronic kidney disease) stage 3, GFR 30-59 ml/min ([name]) No medication changes warranted at the present time.  The patient is asked to make an attempt to improve diet and exercise patterns to aid in medical management of this problem.  Return in about 3 months (around 04/07/2019) for Dm.    The patient was given clear instructions to go to ER or return to medical center if symptoms do not improve, worsen or new problems develop. The patient verbalized understanding and agreed with plan of care.   Ms. Doug Sou. Nathaneil Canary, FNP-BC Patient [facility] [address]  [place], [place] [address] [phone]

## 2019-01-06 LAB — COMPREHENSIVE METABOLIC PANEL
ALT: 24 IU/L (ref 0–44)
AST: 22 IU/L (ref 0–40)
Albumin/Globulin Ratio: 2 (ref 1.2–2.2)
Albumin: 4.3 g/dL (ref 3.8–4.8)
Alkaline Phosphatase: 104 IU/L (ref 39–117)
BUN/Creatinine Ratio: 17 (ref 10–24)
BUN: 29 mg/dL — ABNORMAL HIGH (ref 8–27)
Bilirubin Total: 0.4 mg/dL (ref 0.0–1.2)
CO2: 23 mmol/L (ref 20–29)
Calcium: 9.8 mg/dL (ref 8.6–10.2)
Chloride: 106 mmol/L (ref 96–106)
Creatinine, Ser: 1.72 mg/dL — ABNORMAL HIGH (ref 0.76–1.27)
GFR calc Af Amer: 47 mL/min/{1.73_m2} — ABNORMAL LOW (ref >59)
GFR calc non Af Amer: 40 mL/min/{1.73_m2} — ABNORMAL LOW (ref >59)
Globulin, Total: 2.1 g/dL (ref 1.5–4.5)
Glucose: 115 mg/dL — ABNORMAL HIGH (ref 65–99)
Potassium: 4.3 mmol/L (ref 3.5–5.2)
Sodium: 143 mmol/L (ref 134–144)
Total Protein: 6.4 g/dL (ref 6.0–8.5)

## 2019-01-06 LAB — LIPID PANEL
Chol/HDL Ratio: 2.6 ratio (ref 0.0–5.0)
Cholesterol, Total: 90 mg/dL — ABNORMAL LOW (ref 100–199)
HDL: 34 mg/dL — ABNORMAL LOW (ref >39)
LDL Calculated: 24 mg/dL (ref 0–99)
Triglycerides: 159 mg/dL — ABNORMAL HIGH (ref 0–149)
VLDL Cholesterol Cal: 32 mg/dL (ref 5–40)

## 2019-01-06 LAB — MICROALBUMIN / CREATININE URINE RATIO
Creatinine, Urine: 37.4 mg/dL
Microalb/Creat Ratio: 201 mg/g creat — ABNORMAL HIGH (ref 0–29)
Microalbumin, Urine: 75.3 ug/mL

## 2019-01-08 ENCOUNTER — Telehealth: Payer: Self-pay

## 2019-01-08 NOTE — Telephone Encounter (Signed)
Called and informed patient's daughter William [name]) of lab results. Triglycerides increased and a1c. Patient's daughter is saying he doesn't eat fried food and does low carb but can't seem to loose weight. She is asking if she should increase his torsemide to more than 3 times weekly? Please advise.   He is following up with Nephrology 01/12/2019

## 2019-01-09 NOTE — Telephone Encounter (Signed)
He will need to follow up with nephrology for this.

## 2019-01-09 NOTE — Telephone Encounter (Signed)
Called and left a message for him to follow up with nephrology for torsemide question.

## 2019-01-11 ENCOUNTER — Other Ambulatory Visit: Payer: Self-pay | Admitting: Cardiovascular Disease

## 2019-01-12 NOTE — Telephone Encounter (Signed)
Refill Request.  

## 2019-01-12 NOTE — Telephone Encounter (Signed)
I would have refilled, but I wasn't sure if you were ok giving him 180 tablets.  ; )

## 2019-01-19 ENCOUNTER — Telehealth: Payer: Self-pay

## 2019-01-19 NOTE — Telephone Encounter (Signed)

## 2019-01-21 ENCOUNTER — Other Ambulatory Visit: Payer: Self-pay

## 2019-01-21 ENCOUNTER — Ambulatory Visit (INDEPENDENT_AMBULATORY_CARE_PROVIDER_SITE_OTHER): Payer: Medicare Other | Admitting: Pharmacist Clinician (PhC)/ Clinical Pharmacy Specialist

## 2019-01-21 DIAGNOSIS — Z5181 Encounter for therapeutic drug level monitoring: Secondary | ICD-10-CM | POA: Diagnosis not present

## 2019-01-21 DIAGNOSIS — I480 Paroxysmal atrial fibrillation: Secondary | ICD-10-CM | POA: Diagnosis not present

## 2019-01-21 LAB — POCT INR: INR: 2 (ref 2.0–3.0)

## 2019-01-28 ENCOUNTER — Other Ambulatory Visit: Payer: Self-pay | Admitting: Family Medicine

## 2019-01-28 DIAGNOSIS — M25519 Pain in unspecified shoulder: Secondary | ICD-10-CM

## 2019-01-28 MED ORDER — DICLOFENAC SODIUM 1 % TD GEL: 2.0000 g | Freq: Four times a day (QID) | TRANSDERMAL | 2 refills | Status: DC

## 2019-01-28 NOTE — Telephone Encounter (Signed)
Called, no answer. Left a message to call back. I don't see any pain cream on profile.

## 2019-01-28 NOTE — Telephone Encounter (Signed)
Spoke with patients daughter. She states she showed you a cream at last visit and was suppose to be sent in but was not. She said it was a topical pain cream. Can you send a cream in for this for him to pharmacy? Please advise.

## 2019-01-28 NOTE — Telephone Encounter (Signed)
Sent voltaren gel to the pharmacy on file.

## 2019-02-09 ENCOUNTER — Other Ambulatory Visit: Payer: Self-pay | Admitting: Family Medicine

## 2019-02-09 DIAGNOSIS — Z794 Long term (current) use of insulin: Secondary | ICD-10-CM

## 2019-02-09 DIAGNOSIS — E1152 Type 2 diabetes mellitus with diabetic peripheral angiopathy with gangrene: Secondary | ICD-10-CM

## 2019-02-10 ENCOUNTER — Ambulatory Visit: Payer: Medicare Other | Admitting: Cardiovascular Disease

## 2019-02-10 ENCOUNTER — Ambulatory Visit (INDEPENDENT_AMBULATORY_CARE_PROVIDER_SITE_OTHER): Payer: Medicare Other | Admitting: Pharmacist

## 2019-02-10 ENCOUNTER — Ambulatory Visit (INDEPENDENT_AMBULATORY_CARE_PROVIDER_SITE_OTHER): Payer: Medicare Other | Admitting: Cardiovascular Disease

## 2019-02-10 ENCOUNTER — Other Ambulatory Visit: Payer: Self-pay

## 2019-02-10 ENCOUNTER — Encounter: Payer: Self-pay | Admitting: Cardiovascular Disease

## 2019-02-10 VITALS — BP 124/70 | HR 76 | Temp 97.0°F | Ht 65.0 in | Wt 184.2 lb

## 2019-02-10 DIAGNOSIS — I2510 Atherosclerotic heart disease of native coronary artery without angina pectoris: Secondary | ICD-10-CM | POA: Diagnosis not present

## 2019-02-10 DIAGNOSIS — I5022 Chronic systolic (congestive) heart failure: Secondary | ICD-10-CM

## 2019-02-10 DIAGNOSIS — I480 Paroxysmal atrial fibrillation: Secondary | ICD-10-CM

## 2019-02-10 DIAGNOSIS — Z5181 Encounter for therapeutic drug level monitoring: Secondary | ICD-10-CM

## 2019-02-10 DIAGNOSIS — E785 Hyperlipidemia, unspecified: Secondary | ICD-10-CM

## 2019-02-10 DIAGNOSIS — I739 Peripheral vascular disease, unspecified: Secondary | ICD-10-CM

## 2019-02-10 LAB — POCT INR: INR: 2.5 (ref 2.0–3.0)

## 2019-02-10 NOTE — Patient Instructions (Signed)
Medication Instructions:  Your Physician recommend you continue on your current medication as directed.    If you need a refill on your cardiac medications before your next appointment, please call your pharmacy.   Lab work: None  Testing/Procedures: None  Follow-Up: At [facility], you and your health needs are our priority.  As part of our continuing mission to provide you with exceptional heart care, we have created designated Provider Care Teams.  These Care Teams include your primary Cardiologist (physician) and Advanced Practice Providers (APPs -  Physician Assistants and Nurse Practitioners) who all work together to provide you with the care you need, when you need it. You will need a follow up appointment in 6 months.  Please call our office 2 months in advance to schedule this appointment.  You may see Dr. Arida or one of the following Advanced Practice Providers on your designated Care Team:   Luke Kilroy, PA-C Krista Kroeger, PA-C . Callie Goodrich, PA-C    

## 2019-02-10 NOTE — Progress Notes (Signed)
Cardiology Office Note   Date:  02/10/2019   ID:  William [name], DOB August 14, 1950, MRN [id number]  PCP:  William Boast, FNP  Cardiologist:   William Sacramento, MD   No chief complaint on file.     History of Present Illness: William [name] is a 68 y.o. male who Is here today for a follow-up visit regarding peripheral arterial disease, coronary artery disease and paroxysmal atrial fibrillation. He has known history of inferior myocardial infarction complicated by VSD in September, 2017. He is status post one-vessel CABG and VSD repair. He had postoperative atrial fibrillation and has been on anticoagulation. He had amputation of the right great toe while hospitalized for gangrene. The patient has known history of diabetes.  He is known to have peripheral arterial disease. Angiography in November, 2017 showed no significant aortoiliac disease, significant right distal SFA stenosis and one-vessel runoff below the knee via the peroneal artery with reconstitution of the dorsalis pedis distally. I performed successful drug-coated balloon angioplasty of the right SFA. He is s/p right transmetatarsal amputation for osteomyelitis.    He had angiography to the left leg in March 2018 which showed moderate stenosis affecting the left popliteal artery and TP trunk with 1 vessel runoff below the knee via the peroneal artery which was a large dominant vessel and reconstituted the dorsalis pedis and posterior tibial at the level of the ankle. There was brisk flow to the toes. Thus, no revascularization was performed.  He has been doing very well with no recent chest pain, shortness of breath or palpitations.  He is no longer on losartan due to low blood pressure and chronic kidney disease.  Most recent creatinine was 1.72.  He is followed by nephrology.   Past Medical History:  Diagnosis Date  . AKI (acute kidney injury) ([place])    With STEMI in 2017  . Chronic systolic CHF (congestive heart failure) ([place])    . Diabetes mellitus without complication ([place])   . Hx of adenomatous colonic polyps 04/07/2018  . Hyperlipidemia   . Hypertension   . Paroxysmal atrial fibrillation ([name])   . STEMI (ST elevation myocardial infarction) ([place]) 2017    Past Surgical History:  Procedure Laterality Date  . ABDOMINAL AORTOGRAM W/LOWER EXTREMITY N/A 09/19/2016   Procedure: Abdominal Aortogram w/Lower Extremity;  Surgeon: Wellington Hampshire, MD;  Location: [facility];  Service: Cardiovascular;  Laterality: N/A;  . AMPUTATION Right 03/23/2016   Procedure: 1st and 2nd Ray Amputation Right Foot;  Surgeon: Newt Minion, MD;  Location: [place];  Service: Orthopedics;  Laterality: Right;  . AMPUTATION Right 06/21/2016   Procedure: RIGHT TRANSMETATARSAL AMPUTATION;  Surgeon: Newt Minion, MD;  Location: [facility];  Service: Orthopedics;  Laterality: Right;  . CARDIAC CATHETERIZATION N/A 03/13/2016   Procedure: Right/Left Heart Cath and Coronary Angiography;  Surgeon: Sherren Mocha, MD;  Location: [place];  Service: Cardiovascular;  Laterality: N/A;  . CARDIAC CATHETERIZATION N/A 03/13/2016   Procedure: IABP Insertion;  Surgeon: Sherren Mocha, MD;  Location: [facility];  Service: Cardiovascular;  Laterality: N/A;  . CATARACT EXTRACTION, BILATERAL    . [place]   has had 3 neck surgeries from breaking his neck  . CORONARY ARTERY BYPASS GRAFT N/A 03/13/2016   Procedure: CORONARY ARTERY BYPASS GRAFTING (CABG) x 1 (SVG to OM) with EVH from [place];  Surgeon: Ivin Poot, MD;  Location: [facility];  Service: Open Heart Surgery;  Laterality: N/A;  . LOWER EXTREMITY ANGIOGRAM  05/02/2016   Procedure: Lower Extremity Angiogram;  Surgeon: Wellington Hampshire, MD;  Location: [facility];  Service: Cardiovascular;;  Limited left femoral runoff right femoral runoff  . PERIPHERAL VASCULAR CATHETERIZATION N/A 05/02/2016   Procedure: Abdominal Aortogram;  Surgeon: Wellington Hampshire, MD;  Location: [facility];  Service: Cardiovascular;  Laterality: N/A;  . PERIPHERAL VASCULAR CATHETERIZATION Right 05/02/2016   Procedure: Peripheral Vascular Balloon Angioplasty;  Surgeon: Wellington Hampshire, MD;  Location: [facility];  Service: Cardiovascular;  Laterality: Right;  SFA  . TEE WITHOUT CARDIOVERSION N/A 03/13/2016   Procedure: TRANSESOPHAGEAL ECHOCARDIOGRAM (TEE);  Surgeon: Ivin Poot, MD;  Location: [facility];  Service: Open Heart Surgery;  Laterality: N/A;  . VSD REPAIR N/A 03/13/2016   Procedure: VENTRICULAR SEPTAL DEFECT (VSD) REPAIR;  Surgeon: Ivin Poot, MD;  Location: [place];  Service: Open Heart Surgery;  Laterality: N/A;     Current Outpatient Medications  Medication Sig Dispense Refill  . ACCU-CHEK AVIVA PLUS test strip TEST BLOOD SUGAR 3 TIMES DAILY PRIOR TO MEALS AND BEDTIME 100 each 3  . acetaminophen (TYLENOL ARTHRITIS PAIN) 650 MG CR tablet Take 650 mg by mouth every 8 (eight) hours as needed for pain.    [name] albuterol (PROVENTIL HFA;VENTOLIN HFA) 108 (90 Base) MCG/ACT inhaler Inhale 2 puffs into the lungs every 6 (six) hours as needed for wheezing or shortness of breath. 1 Inhaler 0  . aspirin EC 81 MG tablet Take 1 tablet (81 mg total) by mouth daily. 30 tablet 3  . Blood Glucose Monitoring Suppl (ACCU-CHEK AVIVA PLUS) w/Device KIT 1 each by Does not apply route 4 (four) times daily -  before meals and at bedtime. 1 kit 0  . carvedilol (COREG) 3.125 MG tablet Take 1 tablet (3.125 mg total) by mouth 2 (two) times daily with a meal. 180 tablet 3  . diclofenac sodium (VOLTAREN) 1 % GEL Apply 2 g topically 4 (four) times daily. 150 g 2  . gabapentin (NEURONTIN) 300 MG capsule TAKE 1 CAPSULE(300 MG) BY MOUTH THREE TIMES DAILY 90 capsule 2  . insulin lispro (HUMALOG) 100 UNIT/ML KiwkPen Inject insulin subcutaneously according sliding scale 15 mL 11  . Insulin Pen Needle 31G X 8 MM MISC 1 each by Does not apply route 4 (four) times daily. 100 each  12  . Lancets (ACCU-CHEK SOFT TOUCH) lancets Use as instructed 100 each 12  . nitroGLYCERIN (NITRODUR - DOSED IN MG/24 HR) 0.4 mg/hr patch Place 1 patch (0.4 mg total) onto the skin daily. 30 patch 3  . pantoprazole (PROTONIX) 40 MG tablet TAKE 1 TABLET BY MOUTH TWICE DAILY BEFORE MEAL 30 MINUTES BEFORE BREAKFAST AND SUPPER 60 tablet 5  . pramipexole (MIRAPEX) 0.125 MG tablet TAKE 1 TABLET(0.125 MG) BY MOUTH AT BEDTIME 90 tablet 0  . rosuvastatin (CRESTOR) 40 MG tablet TAKE 1 TABLET BY MOUTH EVERY DAY 90 tablet 3  . torsemide (DEMADEX) 20 MG tablet Take 1 tablet (20 mg total) by mouth 3 (three) times a week. 15 tablet 10  . warfarin (COUMADIN) 5 MG tablet TAKE 1- 1 AND 1/2 TABLETS BY MOUTH DAILY AS DIRECTED BY COUMADIN CLINIC 180 tablet 1  . Insulin Glargine (LANTUS SOLOSTAR) 100 UNIT/ML Solostar Pen Inject 45 Units into the skin at bedtime. 5 pen 11  . silver sulfADIAZINE (SILVADENE) 1 % cream Apply 1 application topically daily. Apply to affected area daily plus dry dressing (Patient  not taking: Reported on 02/10/2019) 400 g 3   No current facility-administered medications for this visit.     Allergies:   Morphine and related and Latex    Social History:  The patient  reports that he quit smoking about 3 years ago. His smoking use included cigarettes. He smoked 0.50 packs per day. He has never used smokeless tobacco. He reports that he does not drink alcohol or use drugs.   Family History:  Not able to obtain due to distress.  ROS:  Please see the history of present illness.   Otherwise, review of systems are positive for none.   All other systems are reviewed and negative.    PHYSICAL EXAM: VS:  BP 124/70   Pulse 76   Temp (!) 97 F (36.1 C)   Ht 5' 5" (1.651 m)   Wt 184 lb 3.2 oz (83.6 kg)   SpO2 97%   BMI 30.65 kg/m  , BMI Body mass index is 30.65 kg/m. GEN: Well nourished, well developed, in no acute distress  HEENT: normal  Neck: no JVD, carotid bruits, or masses Cardiac:  RRR; no  rubs, or gallops,no edema . No murmurs Respiratory:  clear to auscultation bilaterally, normal work of breathing GI: soft, nontender, nondistended, + BS MS: no deformity or atrophy  Skin: warm and dry, no rash Neuro:  Strength and sensation are intact Psych: euthymic mood, full affect Vascular: Distal pulses are not palpable.  No ulceration.  EKG:  EKG is  Not ordered today.  Recent Labs: 03/10/2018: Hemoglobin 12.3; Platelets 153 01/05/2019: ALT 24; BUN 29; Creatinine, Ser 1.72; Potassium 4.3; Sodium 143    Lipid Panel    Component Value Date/Time   CHOL 90 (L) 01/05/2019 1123   TRIG 159 (H) 01/05/2019 1123   HDL 34 (L) 01/05/2019 1123   CHOLHDL 2.6 01/05/2019 1123   CHOLHDL 3.0 03/20/2017 1144   VLDL 29 03/13/2016 1057   LDLCALC 24 01/05/2019 1123   LDLCALC 45 03/20/2017 1144      Wt Readings from Last 3 Encounters:  02/10/19 184 lb 3.2 oz (83.6 kg)  01/05/19 186 lb 12.8 oz (84.7 kg)  09/29/18 180 lb (81.6 kg)      No flowsheet data found.    ASSESSMENT AND PLAN:  1.  Peripheral arterial disease :  He is status post recent drug-coated balloon angioplasty of the right SFA in 2017 . He has significant tibial disease bilaterally. Currently he has no ulceration or claudication. Continue medical therapy. Noninvasive vascular studies this year showed normal ABI on the right side with patent right SFA.  2. Chronic systolic heart failure: He appears to be euvolemic.   Continue small dose carvedilol.  He is no longer on losartan due to low blood pressure and chronic kidney disease.  3. Paroxysmal atrial fibrillation: He is maintaining in sinus rhythm.  He is on long-term anticoagulation with warfarin with no bleeding complications.  4. Coronary artery disease involving native coronary arteries without angina:  status post one-vessel CABG and VSD repair. Continue medical therapy.  5. Diabetes mellitus: Improved from before.  6. Hyperlipidemia: Continue high-dose  rosuvastatin.  Most recent lipid profile last month showed an LDL of 24 and triglyceride of 159.   Disposition:   follow-up with me in 6 months  Signed,  William Sacramento, MD  02/10/2019 3:50 PM    [place]

## 2019-02-11 ENCOUNTER — Other Ambulatory Visit: Payer: Self-pay | Admitting: Internal Medicine

## 2019-02-16 ENCOUNTER — Telehealth: Payer: Self-pay | Admitting: *Deleted

## 2019-02-16 DIAGNOSIS — Z006 Encounter for examination for normal comparison and control in clinical research program: Secondary | ICD-10-CM

## 2019-02-16 NOTE — Telephone Encounter (Signed)
Co-ordinate Diabetes Informed Consent   Subject Name: William [name]  Subject met inclusion and exclusion criteria.  The informed consent form, study requirements and expectations were reviewed with the subject and questions and concerns were addressed prior to the signing of the consent form.  The subject verbalized understanding of the trial requirements.  The subject agreed to participate in the Coordinate Diabetes trial and signed the informed consent at 1530 on 02/16/2019.  The informed consent was obtained prior to performance of any protocol-specific procedures for the subject.  A copy of the signed informed consent was given to the subject and a copy was placed in the subject's medical record.   [name].  This was a verbal consent obtained by phone.

## 2019-03-01 ENCOUNTER — Other Ambulatory Visit: Payer: Self-pay | Admitting: Family Medicine

## 2019-03-01 DIAGNOSIS — G2581 Restless legs syndrome: Secondary | ICD-10-CM

## 2019-03-11 ENCOUNTER — Encounter (HOSPITAL_COMMUNITY): Payer: Self-pay

## 2019-03-11 ENCOUNTER — Encounter (HOSPITAL_COMMUNITY): Payer: Self-pay | Admitting: *Deleted

## 2019-03-20 ENCOUNTER — Other Ambulatory Visit: Payer: Self-pay | Admitting: Family Medicine

## 2019-03-20 DIAGNOSIS — E1152 Type 2 diabetes mellitus with diabetic peripheral angiopathy with gangrene: Secondary | ICD-10-CM

## 2019-03-20 DIAGNOSIS — Z794 Long term (current) use of insulin: Secondary | ICD-10-CM

## 2019-03-23 ENCOUNTER — Ambulatory Visit (INDEPENDENT_AMBULATORY_CARE_PROVIDER_SITE_OTHER): Payer: Medicare Other | Admitting: Pharmacist Clinician (PhC)/ Clinical Pharmacy Specialist

## 2019-03-23 ENCOUNTER — Other Ambulatory Visit: Payer: Self-pay

## 2019-03-23 DIAGNOSIS — Z5181 Encounter for therapeutic drug level monitoring: Secondary | ICD-10-CM

## 2019-03-23 DIAGNOSIS — I480 Paroxysmal atrial fibrillation: Secondary | ICD-10-CM

## 2019-03-23 LAB — POCT INR: INR: 2.5 (ref 2.0–3.0)

## 2019-04-08 ENCOUNTER — Ambulatory Visit: Payer: Medicare Other | Admitting: Family Medicine

## 2019-05-03 ENCOUNTER — Other Ambulatory Visit: Payer: Self-pay | Admitting: Family Medicine

## 2019-05-03 DIAGNOSIS — Z794 Long term (current) use of insulin: Secondary | ICD-10-CM

## 2019-05-03 DIAGNOSIS — E1152 Type 2 diabetes mellitus with diabetic peripheral angiopathy with gangrene: Secondary | ICD-10-CM

## 2019-05-04 ENCOUNTER — Other Ambulatory Visit: Payer: Self-pay

## 2019-05-04 ENCOUNTER — Ambulatory Visit (INDEPENDENT_AMBULATORY_CARE_PROVIDER_SITE_OTHER): Payer: Medicare Other | Admitting: Pharmacist Clinician (PhC)/ Clinical Pharmacy Specialist

## 2019-05-04 DIAGNOSIS — Z5181 Encounter for therapeutic drug level monitoring: Secondary | ICD-10-CM | POA: Diagnosis not present

## 2019-05-04 DIAGNOSIS — I480 Paroxysmal atrial fibrillation: Secondary | ICD-10-CM | POA: Diagnosis not present

## 2019-05-04 LAB — POCT INR: INR: 2.2 (ref 2.0–3.0)

## 2019-05-27 ENCOUNTER — Other Ambulatory Visit: Payer: Self-pay

## 2019-05-27 ENCOUNTER — Other Ambulatory Visit: Payer: Self-pay | Admitting: Internal Medicine

## 2019-05-27 ENCOUNTER — Telehealth: Payer: Self-pay | Admitting: Internal Medicine

## 2019-05-27 MED ORDER — INSULIN LISPRO (1 UNIT DIAL) 100 UNIT/ML (KWIKPEN): PEN_INJECTOR | SUBCUTANEOUS | 11 refills | Status: DC

## 2019-05-27 NOTE — Telephone Encounter (Signed)
Please fill for previous provider

## 2019-05-27 NOTE — Telephone Encounter (Signed)
Refilled. Thanks!

## 2019-05-27 NOTE — Telephone Encounter (Signed)
Please fill for previous PCP if appropriate

## 2019-05-28 ENCOUNTER — Other Ambulatory Visit: Payer: Self-pay | Admitting: Family Medicine

## 2019-05-28 DIAGNOSIS — G2581 Restless legs syndrome: Secondary | ICD-10-CM

## 2019-06-21 ENCOUNTER — Other Ambulatory Visit: Payer: Self-pay | Admitting: Family Medicine

## 2019-06-21 DIAGNOSIS — E1152 Type 2 diabetes mellitus with diabetic peripheral angiopathy with gangrene: Secondary | ICD-10-CM

## 2019-06-21 DIAGNOSIS — Z794 Long term (current) use of insulin: Secondary | ICD-10-CM

## 2019-06-22 NOTE — Telephone Encounter (Signed)
PATIENT TO CALL FOR AN APPOINTMENT ASAP.

## 2019-06-27 ENCOUNTER — Other Ambulatory Visit: Payer: Self-pay | Admitting: Family Medicine

## 2019-06-27 DIAGNOSIS — E1152 Type 2 diabetes mellitus with diabetic peripheral angiopathy with gangrene: Secondary | ICD-10-CM

## 2019-06-30 ENCOUNTER — Encounter: Payer: Self-pay | Admitting: *Deleted

## 2019-06-30 DIAGNOSIS — Z006 Encounter for examination for normal comparison and control in clinical research program: Secondary | ICD-10-CM

## 2019-06-30 NOTE — Research (Addendum)
Marland KitchenMarland KitchenMarland KitchenMarland KitchenMarland KitchenMarland KitchenMarland Kitchen   COORDINATE-Diabetes 3 Month Patient Questionnaire (Intervention) Site #:   [id number]   Patient ID:           [id number] QUESTIONNAIRE  Visit Date 06/30/19   Vital Status [x]   Patient Alive > Proceed to Visit Status []   Patient Dead > Complete Death Form only []   Unknown > Proceed to Visit Status  Visit Status Was the interview completed? []  No >IF NO, Select reason why [] Unable to locate                                    [] No Valid Contacts (patients or alternates) [] Multiple attempts to valid contacts []  Patient no longer cared for at study clinic []  Patient withdrew []  Other, specify:                                           >IF NO, Last date of contact: / /             MM      DD        YYYY    [x] Yes >IF YES, Select source of Interview: [x]   Proxy []   Patient     3 Month Patient Questionnaire (Intervention)  Instructions:   1. Prior to speaking with the patient either over the phone or in person, print off or have available the patient's current list of medications.      IF conducting follow-up visit over the phone - Instruct the patient to gather all their pill bottles. 2. For the purpose of the COORDINATE-Diabetes Study, please document whether the patient is currently taking each of the following medication classes. 3. DO NOT PROMPT DURING FOLLOW-UP VISIT: IF subject mentions reaction to one of the following medications, complete the AE/SAE section and follow instructions in operations manual regarding Adverse event reporting to [employer] ([facility]).  MEDICATIONS  Medication Are you currently taking [insert name of previous med]? If currently taking: If started since last visit: If stopped since last visit:  ACE Inhibitor / Angiotensin Receptor Blocker (ARB) / Angiotensin Receptor Neprilysin inhibitor (ARNi) [x] No >  [] Yes > [] Stopped since last visit [x] Never prescribed [] Started since last visit [] Same medication as last      visit [] Different medication than       last visit [Study personnel to answer]  Documented in EHR? [] No  [] Yes   If no, Verified by: [] Photo of bottle [] Copy of rx [] Dispensing       pharmacy [] Prescribing provider [] Other (specify):                    Date started:        / /             MM DD YYYY Who prescribed this medication for you? [] Cardiology provider > [] Study clinic [] Outside clinic [] Endocrinology provider [] Primary care provider [] Other provider > Specify:                             [] Unknown Date discontinued:        / /             MM DD YYYY Why did you stop taking this medication? (  check all that apply) [] Allergic reaction [] Medication side effects [] Unable to       adhere/monitor [] Had an      operation/procedure      that required      stopping it [] Unable to afford it [] No longer wants        to take        this medication [] Provider decision [] Pregnancy [] Other (specify: ) [] Unknown Reason   If started or changed  What medication are you taking now? [] Benazepril (Lotensin) [] Captopril (Capoten) [] Enalapril (Vasotec) [] Fosinopril (Monopril) [] Lisinopril (Zestril, Prinivil) [] Quinapril (Accupril) [] Ramipril (Altace) [] Azilsartan (Edarbi) [] Candesartan (Atacand) [] Irbesartan (Avapro) [] Losartan (Cozaar) [] Olmesartan (Benicar) [] Telmisartan (Micardis) [] Valsartan (Diovan) [] Sacrubitril/Valsartan Delene Loll)      Medication Are you currently taking [Rosuvastatin]? If currently taking: If started since last visit: If stopped since last visit:  Statin []  No >  [x] Yes > [] Stopped since last visit [] Never prescribed [] Started since last visit [x] Same medication as last        visit [] Different medication or dose     than last visit [Study personnel to answer]  Documented in EHR? [] No [] Yes    If no, Verified by: [] Photo of bottle [] Copy of rx [] Dispensing pharmacy [] Prescribing provider [] Other (specify):                     Date started:        / /             MM DD YYYY Who prescribed this medication for you? [] Cardiology provider  [] Study clinic [] Outside clinic [] Endocrinology provider [] Primary care provider [] Other provider  Specify:                             [] Unknown Date discontinued:        / /             MM DD YYYY Why did you stop taking this medication? (check all that apply) [] Allergic reaction [] Medication side effects  [] Muscle aches [] Weakness [] Joint pain [] Cognitive symptoms [] Other (specify):                          [] Unable to        adhere/monitor [] Had an    operation/procedure    that required stopping it [] Unable to afford it [] No longer wants       to take this            medication [] Provider decision [] Pregnancy [] Other (specify: ) [] Unknown Reason   If started or changed  What medication are you taking now? [] Atorvastatin (Lipitor) [] Fluvastatin (Lescol) [] Lovastatin (Mevacor) [] Pravastatin (Pravachol) [] Rosuvastatin (Crestor) [] Simvastatin (Zocor) [] Pitatavastatin (Livalo)  Dose: [] 1 mg  [] 10 mg [] 2 mg  []  20 mg [] 3 mg  []  40 mg [] 4 mg  []  60 mg [] 5 mg  []  80 mg  Frequency: [] Daily []  Less than daily      Medication Are you currently taking [insert name of previous med]? If currently taking: If started since last visit: If stopped since last visit:  SGLT2 Inhibitor [x] No   [] Yes [] Stopped since last visit [x] Never prescribed [] Started since last visit [] Same medication as last      visit [] Different medication than last visit [Study personnel to answer]  Documented in EHR? [] No [] Yes   If no, Verified by: [] Photo of bottle [] Copy of rx [] Dispensing       pharmacy [] Prescribing  provider [] Other (specify):                    Date started:        / /             MM DD YYYY Who prescribed this medication for you? [] Cardiology provider  [] Study clinic [] Outside clinic [] Endocrinology  provider [] Primary care provider [] Other provider  Specify:                             [] Unknown Date discontinued:        / /             MM DD YYYY Why did you stop taking this medication? (check all that apply) [] Allergic reaction [] Medication side effects [] Unable to       adhere/monitor [] Had an        operation/procedure         that required           stopping it [] Patient unable to       afford it [] Patient no longer       wants to       take this medication [] Provider decision [] Pregnancy [] Other (specify: ) [] Unknown Reason   If started or changed  What medication are you taking now? [] Canaglifozin (Invokana) [] Dapagliflozin (Farxiga) [] Empaglifozin (Jardiance) [] Ertugliflozin (Steglatro)     GLP1 Receptor Agonist [x] No   [] Yes [] Stopped since last visit [x] Never prescribed [] Started since last visit [] Same medication as last      visit [] Different medication than       last visit [Study personnel to answer]  Documented in EHR? [] No [] Yes   If no, Verified by: [] Photo of bottle [] Copy of rx [] Dispensing       pharmacy [] Prescribing       provider [] Other (specify):                    Date started:        / /             MM DD YYYY Who prescribed this medication for you? [] Cardiology provider  [] Study clinic [] Outside clinic [] Endocrinology provider [] Primary care provider [] Other provider  Specify:                             [] Unknown Date discontinued:        / /             MM DD YYYY Why did you stop taking this medication? (check all that apply) [] Allergic reaction [] Medication side effects [] Unable to      adhere/monitor [] Had an    operation/procedure    that required stopping it [] Patient unable to        afford it [] Patient no longer        wants to take this        medication [] Provider decision [] Pregnancy [] Other (specify: ) [] Unknown Reason   If started or changed  What medication are you taking  now? [] Albiglutide (Tanzeum) [] Dulaglutide (Trulicity) [] Exanatide (Byetta, Bydureon) [] Liraglutide (Victoza, Saxenda) [] Lixisenatide (Adlyxin) [] Semaglutice (Ozempic)     09/09/2018 ([facility] Release)  Spoke with pt daughter, William [name] for Coordinate DM 3 month visit. Pt has had no change in medications. Stated " I miss placed the study packet. Can you please send a new one. Will mail out packet today.

## 2019-07-06 ENCOUNTER — Encounter (INDEPENDENT_AMBULATORY_CARE_PROVIDER_SITE_OTHER): Payer: Self-pay

## 2019-07-06 ENCOUNTER — Ambulatory Visit (INDEPENDENT_AMBULATORY_CARE_PROVIDER_SITE_OTHER): Payer: Medicare Other | Admitting: Pharmacist Clinician (PhC)/ Clinical Pharmacy Specialist

## 2019-07-06 ENCOUNTER — Other Ambulatory Visit: Payer: Self-pay

## 2019-07-06 DIAGNOSIS — I480 Paroxysmal atrial fibrillation: Secondary | ICD-10-CM

## 2019-07-06 DIAGNOSIS — Z5181 Encounter for therapeutic drug level monitoring: Secondary | ICD-10-CM | POA: Diagnosis not present

## 2019-07-06 LAB — POCT INR: INR: 2.2 (ref 2.0–3.0)

## 2019-07-10 ENCOUNTER — Other Ambulatory Visit: Payer: Self-pay | Admitting: Family Medicine

## 2019-07-10 ENCOUNTER — Other Ambulatory Visit: Payer: Self-pay | Admitting: Internal Medicine

## 2019-07-10 DIAGNOSIS — Z794 Long term (current) use of insulin: Secondary | ICD-10-CM

## 2019-07-10 DIAGNOSIS — E1152 Type 2 diabetes mellitus with diabetic peripheral angiopathy with gangrene: Secondary | ICD-10-CM

## 2019-07-10 MED ORDER — INSULIN LISPRO (1 UNIT DIAL) 100 UNIT/ML (KWIKPEN): PEN_INJECTOR | SUBCUTANEOUS | 0 refills | Status: DC

## 2019-07-10 NOTE — Telephone Encounter (Signed)
Please fill for covering provider

## 2019-08-03 ENCOUNTER — Other Ambulatory Visit: Payer: Self-pay

## 2019-08-03 ENCOUNTER — Other Ambulatory Visit: Payer: Self-pay | Admitting: Internal Medicine

## 2019-08-03 ENCOUNTER — Ambulatory Visit (INDEPENDENT_AMBULATORY_CARE_PROVIDER_SITE_OTHER): Payer: Medicare Other | Admitting: Pharmacist Clinician (PhC)/ Clinical Pharmacy Specialist

## 2019-08-03 DIAGNOSIS — I480 Paroxysmal atrial fibrillation: Secondary | ICD-10-CM | POA: Diagnosis not present

## 2019-08-03 DIAGNOSIS — Z5181 Encounter for therapeutic drug level monitoring: Secondary | ICD-10-CM

## 2019-08-03 DIAGNOSIS — E1152 Type 2 diabetes mellitus with diabetic peripheral angiopathy with gangrene: Secondary | ICD-10-CM

## 2019-08-03 DIAGNOSIS — Z794 Long term (current) use of insulin: Secondary | ICD-10-CM

## 2019-08-03 LAB — POCT INR: INR: 2.7 (ref 2.0–3.0)

## 2019-08-03 MED ORDER — GABAPENTIN 300 MG PO CAPS: ORAL_CAPSULE | ORAL | 2 refills | Status: DC

## 2019-09-01 ENCOUNTER — Other Ambulatory Visit: Payer: Self-pay | Admitting: Cardiovascular Disease

## 2019-09-01 ENCOUNTER — Ambulatory Visit (INDEPENDENT_AMBULATORY_CARE_PROVIDER_SITE_OTHER): Payer: Medicare Other | Admitting: Cardiovascular Disease

## 2019-09-01 ENCOUNTER — Encounter: Payer: Self-pay | Admitting: Cardiovascular Disease

## 2019-09-01 ENCOUNTER — Other Ambulatory Visit: Payer: Self-pay

## 2019-09-01 ENCOUNTER — Ambulatory Visit (INDEPENDENT_AMBULATORY_CARE_PROVIDER_SITE_OTHER): Payer: Medicare Other | Admitting: Pharmacist Clinician (PhC)/ Clinical Pharmacy Specialist

## 2019-09-01 VITALS — BP 132/70 | HR 82 | Temp 96.8°F | Ht 65.0 in | Wt 186.6 lb

## 2019-09-01 DIAGNOSIS — I480 Paroxysmal atrial fibrillation: Secondary | ICD-10-CM | POA: Diagnosis not present

## 2019-09-01 DIAGNOSIS — Z5181 Encounter for therapeutic drug level monitoring: Secondary | ICD-10-CM

## 2019-09-01 DIAGNOSIS — I739 Peripheral vascular disease, unspecified: Secondary | ICD-10-CM | POA: Diagnosis not present

## 2019-09-01 DIAGNOSIS — I5022 Chronic systolic (congestive) heart failure: Secondary | ICD-10-CM

## 2019-09-01 DIAGNOSIS — I2510 Atherosclerotic heart disease of native coronary artery without angina pectoris: Secondary | ICD-10-CM

## 2019-09-01 DIAGNOSIS — E785 Hyperlipidemia, unspecified: Secondary | ICD-10-CM

## 2019-09-01 LAB — BASIC METABOLIC PANEL
BUN/Creatinine Ratio: 19 (ref 10–24)
BUN: 35 mg/dL — ABNORMAL HIGH (ref 8–27)
CO2: 25 mmol/L (ref 20–29)
Calcium: 9.8 mg/dL (ref 8.6–10.2)
Chloride: 103 mmol/L (ref 96–106)
Creatinine, Ser: 1.8 mg/dL — ABNORMAL HIGH (ref 0.76–1.27)
GFR calc Af Amer: 44 mL/min/{1.73_m2} — ABNORMAL LOW (ref >59)
GFR calc non Af Amer: 38 mL/min/{1.73_m2} — ABNORMAL LOW (ref >59)
Glucose: 106 mg/dL — ABNORMAL HIGH (ref 65–99)
Potassium: 4.5 mmol/L (ref 3.5–5.2)
Sodium: 141 mmol/L (ref 134–144)

## 2019-09-01 LAB — CBC
Hematocrit: 36.1 % — ABNORMAL LOW (ref 37.5–51.0)
Hemoglobin: 12.6 g/dL — ABNORMAL LOW (ref 13.0–17.7)
MCH: 30.1 pg (ref 26.6–33.0)
MCHC: 34.9 g/dL (ref 31.5–35.7)
MCV: 86 fL (ref 79–97)
Platelets: 147 10*3/uL — ABNORMAL LOW (ref 150–450)
RBC: 4.19 x10E6/uL (ref 4.14–5.80)
RDW: 13.6 % (ref 11.6–15.4)
WBC: 6.4 10*3/uL (ref 3.4–10.8)

## 2019-09-01 LAB — POCT INR: INR: 2.2 (ref 2.0–3.0)

## 2019-09-01 NOTE — Progress Notes (Signed)
Cardiology Office Note   Date:  09/01/2019   ID:  William [name], DOB 05/26/1951, MRN [id number]  PCP:  [name], FNP  Cardiologist:   [name], MD   No chief complaint on file.     History of Present Illness: William [name] is a 69 y.o. male who Is here today for a follow-up visit regarding peripheral arterial disease, coronary artery disease and paroxysmal atrial fibrillation. He has known history of inferior myocardial infarction complicated by VSD in September, 2017. He is status post one-vessel CABG and VSD repair. He had postoperative atrial fibrillation and has been on anticoagulation. He had amputation of the right great toe while hospitalized for gangrene. The patient has known history of diabetes.  He is known to have peripheral arterial disease. Angiography in November, 2017 showed no significant aortoiliac disease, significant right distal SFA stenosis and one-vessel runoff below the knee via the peroneal artery with reconstitution of the dorsalis pedis distally. I performed successful drug-coated balloon angioplasty of the right SFA. He is s/p right transmetatarsal amputation for osteomyelitis.    He had angiography to the left leg in March 2018 which showed moderate stenosis affecting the left popliteal artery and TP trunk with 1 vessel runoff below the knee via the peroneal artery which was a large dominant vessel and reconstituted the dorsalis pedis and posterior tibial at the level of the ankle. There was brisk flow to the toes. Thus, no revascularization was performed.  He has been doing very well with no recent chest pain, shortness of breath, palpitations or lower extremity claudication.  He takes his medications regularly with assistance from his daughter.   Past Medical History:  Diagnosis Date  . AKI (acute kidney injury) ([place])    With STEMI in 2017  . Chronic systolic CHF (congestive heart failure) ([place])   . Diabetes mellitus without complication ([place])     . Hx of adenomatous colonic polyps 04/07/2018  . Hyperlipidemia   . Hypertension   . Paroxysmal atrial fibrillation ([name])   . STEMI (ST elevation myocardial infarction) ([place]) 2017    Past Surgical History:  Procedure Laterality Date  . ABDOMINAL AORTOGRAM W/LOWER EXTREMITY N/A 09/19/2016   Procedure: Abdominal Aortogram w/Lower Extremity;  Surgeon: Wellington Hampshire, MD;  Location: [facility];  Service: Cardiovascular;  Laterality: N/A;  . AMPUTATION Right 03/23/2016   Procedure: 1st and 2nd Ray Amputation Right Foot;  Surgeon: Newt Minion, MD;  Location: [facility];  Service: Orthopedics;  Laterality: Right;  . AMPUTATION Right 06/21/2016   Procedure: RIGHT TRANSMETATARSAL AMPUTATION;  Surgeon: Newt Minion, MD;  Location: [facility];  Service: Orthopedics;  Laterality: Right;  . CARDIAC CATHETERIZATION N/A 03/13/2016   Procedure: Right/Left Heart Cath and Coronary Angiography;  Surgeon: Sherren Mocha, MD;  Location: [facility];  Service: Cardiovascular;  Laterality: N/A;  . CARDIAC CATHETERIZATION N/A 03/13/2016   Procedure: IABP Insertion;  Surgeon: Sherren Mocha, MD;  Location: [facility];  Service: Cardiovascular;  Laterality: N/A;  . CATARACT EXTRACTION, BILATERAL    . William [name]   has had 3 neck surgeries from breaking his neck  . CORONARY ARTERY BYPASS GRAFT N/A 03/13/2016   Procedure: CORONARY ARTERY BYPASS GRAFTING (CABG) x 1 (SVG to OM) with EVH from [place];  Surgeon: Ivin Poot, MD;  Location: [facility];  Service: Open Heart Surgery;  Laterality: N/A;  . LOWER EXTREMITY ANGIOGRAM  05/02/2016   Procedure: Lower  Extremity Angiogram;  Surgeon: Wellington Hampshire, MD;  Location: [facility];  Service: Cardiovascular;;  Limited left femoral runoff right femoral runoff  . PERIPHERAL VASCULAR CATHETERIZATION N/A 05/02/2016   Procedure: Abdominal Aortogram;  Surgeon: Wellington Hampshire, MD;  Location: [facility];  Service:  Cardiovascular;  Laterality: N/A;  . PERIPHERAL VASCULAR CATHETERIZATION Right 05/02/2016   Procedure: Peripheral Vascular Balloon Angioplasty;  Surgeon: Wellington Hampshire, MD;  Location: [facility];  Service: Cardiovascular;  Laterality: Right;  SFA  . TEE WITHOUT CARDIOVERSION N/A 03/13/2016   Procedure: TRANSESOPHAGEAL ECHOCARDIOGRAM (TEE);  Surgeon: Ivin Poot, MD;  Location: [facility];  Service: Open Heart Surgery;  Laterality: N/A;  . VSD REPAIR N/A 03/13/2016   Procedure: VENTRICULAR SEPTAL DEFECT (VSD) REPAIR;  Surgeon: Ivin Poot, MD;  Location: [facility];  Service: Open Heart Surgery;  Laterality: N/A;     Current Outpatient Medications  Medication Sig Dispense Refill  . ACCU-CHEK AVIVA PLUS test strip USE THREE TIMES DAILY PRIOR TO MEALS AND AT BEDTIME 100 strip 3  . acetaminophen (TYLENOL ARTHRITIS PAIN) 650 MG CR tablet Take 650 mg by mouth every 8 (eight) hours as needed for pain.    [name] albuterol (PROVENTIL HFA;VENTOLIN HFA) 108 (90 Base) MCG/ACT inhaler Inhale 2 puffs into the lungs every 6 (six) hours as needed for wheezing or shortness of breath. 1 Inhaler 0  . aspirin EC 81 MG tablet Take 1 tablet (81 mg total) by mouth daily. 30 tablet 3  . Blood Glucose Monitoring Suppl (ACCU-CHEK AVIVA PLUS) w/Device KIT 1 each by Does not apply route 4 (four) times daily -  before meals and at bedtime. 1 kit 0  . carvedilol (COREG) 3.125 MG tablet TAKE 1 TABLET(3.125 MG) BY MOUTH TWICE DAILY WITH A MEAL 180 tablet 0  . diclofenac sodium (VOLTAREN) 1 % GEL Apply 2 g topically 4 (four) times daily. 150 g 2  . gabapentin (NEURONTIN) 300 MG capsule TAKE 1 CAPSULE(300 MG) BY MOUTH THREE TIMES DAILY 90 capsule 2  . insulin lispro (HUMALOG KWIKPEN) 100 UNIT/ML KwikPen ADMINISTER UP TO 10 UNITS UNDER THE SKIN THREE TIMES DAILY AS DIRECTED PER SLIDING SCALE 27 mL 3  . Insulin Pen Needle 31G X 8 MM MISC 1 each by Does not apply route 4 (four) times daily. 100 each 12  . Lancets (ACCU-CHEK SOFT  TOUCH) lancets Use as instructed 100 each 12  . LANTUS SOLOSTAR 100 UNIT/ML Solostar Pen ADMINISTER 55 UNITS UNDER THE SKIN AT BEDTIME 18 mL 2  . nitroGLYCERIN (NITRODUR - DOSED IN MG/24 HR) 0.4 mg/hr patch Place 1 patch (0.4 mg total) onto the skin daily. 30 patch 3  . pantoprazole (PROTONIX) 40 MG tablet TAKE 1 TABLET BY MOUTH TWICE DAILY BEFORE A MEAL AND 30 MINUTES BEFORE BREAKFAST AND SUPPER 60 tablet 5  . pramipexole (MIRAPEX) 0.125 MG tablet TAKE 1 TABLET(0.125 MG) BY MOUTH AT BEDTIME 90 tablet 0  . rosuvastatin (CRESTOR) 40 MG tablet TAKE 1 TABLET BY MOUTH EVERY DAY 90 tablet 3  . silver sulfADIAZINE (SILVADENE) 1 % cream Apply 1 application topically daily. Apply to affected area daily plus dry dressing 400 g 3  . torsemide (DEMADEX) 20 MG tablet Take 1 tablet (20 mg total) by mouth 3 (three) times a week. 15 tablet 10  . warfarin (COUMADIN) 5 MG tablet TAKE 1- 1 AND 1/2 TABLETS BY MOUTH DAILY AS DIRECTED BY COUMADIN CLINIC 180 tablet 1   No current facility-administered  medications for this visit.    Allergies:   Morphine and related and Latex    Social History:  The patient  reports that he quit smoking about 3 years ago. His smoking use included cigarettes. He smoked 0.50 packs per day. He has never used smokeless tobacco. He reports that he does not drink alcohol or use drugs.   Family History:  Not able to obtain due to distress.  ROS:  Please see the history of present illness.   Otherwise, review of systems are positive for none.   All other systems are reviewed and negative.    PHYSICAL EXAM: VS:  BP 132/70   Pulse 82   Temp (!) 96.8 F (36 C)   Ht '5\' 5"'  (1.651 m)   Wt 186 lb 9.6 oz (84.6 kg)   SpO2 98%   BMI 31.05 kg/m  , BMI Body mass index is 31.05 kg/m. GEN: Well nourished, well developed, in no acute distress  HEENT: normal  Neck: no JVD, carotid bruits, or masses Cardiac: RRR; no  rubs, or gallops,no edema . No murmurs Respiratory:  clear to auscultation  bilaterally, normal work of breathing GI: soft, nontender, nondistended, + BS MS: no deformity or atrophy  Skin: warm and dry, no rash Neuro:  Strength and sensation are intact Psych: euthymic mood, full affect Vascular: Distal pulses are not palpable.  No ulceration.  EKG:  EKG is  ordered today. EKG showed normal sinus rhythm with prior inferior infarct and no significant ST or T wave changes.  Recent Labs: 01/05/2019: ALT 24; BUN 29; Creatinine, Ser 1.72; Potassium 4.3; Sodium 143    Lipid Panel    Component Value Date/Time   CHOL 90 (L) 01/05/2019 1123   TRIG 159 (H) 01/05/2019 1123   HDL 34 (L) 01/05/2019 1123   CHOLHDL 2.6 01/05/2019 1123   CHOLHDL 3.0 03/20/2017 1144   VLDL 29 03/13/2016 1057   LDLCALC 24 01/05/2019 1123   LDLCALC 45 03/20/2017 1144      Wt Readings from Last 3 Encounters:  09/01/19 186 lb 9.6 oz (84.6 kg)  02/10/19 184 lb 3.2 oz (83.6 kg)  01/05/19 186 lb 12.8 oz (84.7 kg)      No flowsheet data found.     ASSESSMENT AND PLAN:  1.  Peripheral arterial disease :  He is status post recent drug-coated balloon angioplasty of the right SFA in 2017 . He has significant tibial disease bilaterally. Currently he has no ulceration or claudication. Continue medical therapy. Noninvasive vascular studies last year showed normal ABI on the right side with patent right SFA.  2. Chronic systolic heart failure: He appears to be euvolemic.   Continue small dose carvedilol.  He is no longer on losartan due to low blood pressure and chronic kidney disease.  Most recent echo in 2018 showed an EF of 45 to 50%.  3. Paroxysmal atrial fibrillation: He is maintaining in sinus rhythm.  He is on long-term anticoagulation with warfarin with no bleeding complications.  The patient's atrial fibrillation was mainly post CABG/VSD repair.  I will consider stopping anticoagulation in the near future as long as no recurrent A. fib.  4. Coronary artery disease involving native  coronary arteries without angina:  status post one-vessel CABG and VSD repair. Continue medical therapy.  5. Diabetes mellitus: Improved from before.  6. Hyperlipidemia: Continue high-dose rosuvastatin.  Most recent lipid profile showed an LDL of 24.   Disposition:   follow-up with me in 6 months  Signed,  [name], MD  09/01/2019 1:44 PM    [place]

## 2019-09-01 NOTE — Patient Instructions (Signed)
Medication Instructions:  No changes *If you need a refill on your cardiac medications before your next appointment, please call your pharmacy*   Lab Work: Your provider would like for you to have the following labs today: CBC and BMET  If you have labs (blood work) drawn today and your tests are completely normal, you will receive your results only by: [name] MyChart Message (if you have MyChart) OR . A paper copy in the mail If you have any lab test that is abnormal or we need to change your treatment, we will call you to review the results.   Testing/Procedures: None ordered   Follow-Up: At [facility], you and your health needs are our priority.  As part of our continuing mission to provide you with exceptional heart care, we have created designated Provider Care Teams.  These Care Teams include your primary Cardiologist (physician) and Advanced Practice Providers (APPs -  Physician Assistants and Nurse Practitioners) who all work together to provide you with the care you need, when you need it.  We recommend signing up for the patient portal called "MyChart".  Sign up information is provided on this After Visit Summary.  MyChart is used to connect with patients for Virtual Visits (Telemedicine).  Patients are able to view lab/test results, encounter notes, upcoming appointments, etc.  Non-urgent messages can be sent to your provider as well.   To learn more about what you can do with MyChart, go to [email or web address].    Your next appointment:   6 month(s)  The format for your next appointment:   In Person  Provider:   [name], MD

## 2019-09-02 ENCOUNTER — Encounter: Payer: Self-pay | Admitting: *Deleted

## 2019-09-02 NOTE — Research (Unsigned)
COORDINATE-Diabetes 6 Month CASE REPORT FORM (Intervention) -EHR Site #:   [id number]              Patient ID:          [id number]     3 [facility] EHR REVIEW  Medical Record Check Date 09/02/19   Vital Status '[x]' Patient Alive >Date last known alive per EHR:   '[]' Patient Dead >> Complete Death Form  '[]' Unknown   CLINICAL EVENTS / PROCEDURES  Hospitalization since last visit? (>=24 hour stay) '[x]' No '[]' Yes  >> if yes, Complete the following  Date of hospital admission:        / /             MM DD YYYY  Primary discharge diagnosis:  *Complete appropriate event validation form '[]' acute myocardial infarction (heart attack)* '[]' stroke* '[]' heart failure* '[]' coronary revascularization* '[]' peripheral revascularization* '[]' cerebral revascularization* '[]' diabetes (e.g. hypoglycemia, DKA) '[]' renal failure '[]' amputation '[]' other cardiovascular reason '[]' other NON-cardiovascular reason '[]' unknown  Other diagnoses not documented above: (check all that apply)  *Complete appropriate event validation form '[]' acute myocardial infarction (heart attack)* '[]' stroke* '[]' heart failure* '[]' coronary revascularization* '[]' peripheral revascularization* '[]' cerebral revascularization* '[]' diabetes (e.g. hypoglycemia, DKA) '[]' renal failure '[]' amputation '[]' other cardiovascular reason '[]' other NON-cardiovascular reason '[]' unknown  Were any of the following outpatient procedures done since the last visit? (I.e. procedures not captured above)  Coronary revascularization '[x]' No '[]' Yes >IF YES, Date / /             MM DD YYYY  Peripheral revascularization '[x]' No '[]' Yes > IF YES, Date / /             MM DD YYYY  Cerebral revascularization '[x]' No '[]' Yes > IF YES, Date / /             MM DD YYYY  Extremity amputation    '[x]' No '[]' Yes >IF YES, Date / /             MM      DD       YYYY  Renal replacement therapy (i.e. dialysis)    '[x]' No '[]' Yes > IF YES, Date of initiation / /             MM      DD       YYYY   **EDC will allow for collection of  multiple hospitalizations and procedures   MEDICATIONS  Medication Currently Prescribed? If started since last visit: If not started since last visit: If stopped since last visit:  Cardiac Medications  ACE Inhibitor / Angiotensin Receptor Blocker (ARB) / Angiotensin Receptor Neprilysin inhibitor (ARNi)  '[x]' No >  '[]' Yes > Since last visit, medication was: '[]' Stopped '[]' Not started '[]' Started '[]' Continued same medication '[]' Continued with medication changes Date started:        / /             MM DD YYYY Who prescribed? '[]' Cardiology provider  '[]' Study clinic '[]' Outside clinic '[]' Endocrinology provider '[]' Primary care provider '[]' Other provider  Specify:                             '[]' Unknown Reason (check all that apply): '[]' History of swelling around lips, eyes or face '[]' Feeling dizzy/lightheaded '[]' Low blood pressure '[]' Poor or fluctuating kidney function '[]' High potassium '[]' Patient has experienced other side effects to this medication  before '[]' Patient will be unable to adhere/monitor '[]' Patient unable to afford it '[]' Patient does not want to      take this medication '[]' Pregnancy '[]' Other (specify: ) '[]' Unknown  Reason Date discontinued:        / /             MM DD YYYY Reason (check all that apply): '[]' Swelling around lips, eyes       or face '[]' Feeling dizzy/lightheaded '[]' Low blood pressure '[]' Poor or fluctuating kidney function '[]' High potassium '[]' Other medication side       effects '[]' Patient unable to        adhere/monitor '[]' Had an       operation/procedure that        required stopping it '[]' Patient unable to afford it '[]' Patient no longer wants       to take this medication '[]' Pregnancy '[]' Other (specify: ) '[]' Unknown Reason   If started or changed  Medication Name: '[]' Benazepril (Lotensin) '[]' Captopril (Capoten) '[]' Enalapril (Vasotec) '[]' Fosinopril (Monopril) '[]' Lisinopril (Zestril, Prinivil) '[]' Quinapril (Accupril) '[]' Ramipril (Altace) '[]' Azilsartan  (Edarbi) '[]' Candesartan (Atacand) '[]' Irbesartan (Avapro) '[]' Losartan (Cozaar) '[]' Olmesartan (Benicar) '[]' Telmisartan (Micardis) '[]' Valsartan (Diovan) '[]' Sacrubitril/Valsartan (Entresto)     Beta Blocker '[]' No '[x]'  Yes > '[]' Acebutolol (Sectral) '[]' Bisoprolol (Zebeta) '[x]' Carvedilol (Coreg) '[]' Labetalol (Trandate,      Normodyne) '[]' Metoprolol succinate (Toprol) '[]' Metoprolol tartrate       (Lopressor) '[]' Nadolol (Corgard) '[]' Nebivolol (Bystolic) '[]' Propranolol (Inderal) '[]' Sotalol (Betapace)     Medication Currently Prescribed? If started since last visit: If not started since last visit: If stopped since last visit:  Aldosterone Antagonist '[x]' No '[]' Yes > '[]' Amiloride '[]' Eplerenone (Inspra) '[]' Spirinolactone (Aldactone) '[]' Traimterene (Dyrenium)   Calcium Channel Blocker '[x]' No '[]'  Yes > Medication Name: '[]' Amlodipine (Norvasc) '[]' Diltiazem (Cardizem) '[]' Felodipine (Plendil) '[]' Nifedipine (Procardia) '[]' Verapamil (Calan)   Diuretic Loop '[]' No '[x]'  Yes > Medication Name: '[]' Bumetanide (Bumex) '[]' Ethacrynic acid (Edecrin) '[]' Furosemide (Lasix) '[x]' Torsemide (Demadex)   Diuretic Thiazide- type '[x]' No '[]'  Yes > Medication Name: '[]' Chlorothiazide      '[]' Chlorthalidone '[]' Hydrochlorothiazide '[]' Indapamide '[]' Metolazone   Anticoagulation Therapy (other than Warfarin) '[x]' No '[]'  Yes > Medication Name: '[]' Apixaban (Eliquis) '[]' Edoxaban (Lixiana) '[]' Rivaroxaban Alen Blew) '[]' Dabigatran (Redaxa)   Warfarin '[]' No '[x]'  Yes    Antiplatelet Agent (including aspirin) '[]' No '[x]'  Yes > Medication Name (check all that apply): '[x]' Aspirin '[]' Clopidogrel (Plavix) '[]' Prasugrel (Effient) '[]' Ticagrelor (Brilinta) '[]' Ticlopidine (Ticlid) '[]' Dipyridamole (Persantine)    Medication Currently Prescribed? If started since last visit: If not started since last visit: If stopped since last visit:  Statin  '[]' No >  '[x]' Yes > Since last visit, medication was: '[]' Stopped '[]' Not started '[]' Started '[x]' Continued same  medication and dose '[]' Continued with dose or medication changes Date started:        / /             MM DD YYYY Who prescribed? '[]' Cardiology provider '[]' Endocrinology provider '[]' Primary care provider '[]' Other provider  Specify:                             '[]' Unknown Reason (check all that apply): '[]' History of Rhabdomyolysis '[]' LDL-cholesterol already       <70 '[]' Muscle      aches/pain/weakness '[]' Mental       fogginess/memory loss '[]' Liver dysfunction '[]' Patient has  experienced other side   effects to this medication before '[]' Patient will be unable to adhere/monitor '[]' Patient unable to afford it '[]' Patient does not want to take this medication '[]' Pregnancy '[]' Other (specify: ) '[]' Unknown Reason Date discontinued:        / /             MM DD YYYY Reason (check all that apply): '[]' Rhabdomyolysis '[]' Muscle aches/pain/weakness '[]' Mental fogginess/memory loss '[]' Liver dysfunction '[]' Other medication side effects '[]' Patient unable to  adhere/monitor '[]' Patient unable to afford it '[]' Patient no longer wants to take       this medication '[]' Pregnancy '[]' Other (specify: ) '[]' Unknown Reason   If started or changed  Medication Name: '[]' Atorvastatin (Lipitor) '[]' Fluvastatin (Lescol) '[]' Lovastatin (Mevacor) '[]' Pravastatin (Pravachol) '[]' Rosuvastatin (Crestor) '[]' Simvastatin (Zocor) '[]' Pitatavastatin (Livalo)  Dose: '[]' 1 mg '[]' 10 mg '[]' 2 mg '[]'  20 mg '[]' 3 mg '[]'  40 mg '[]' 4 mg '[]'  60 mg '[]' 5 mg '[]'  80 mg  Frequency: '[]' Daily  '[]' Less than daily      Does the patient have statin intolerance that prevents the use of maximum dose of high potency statin? '[x]' No '[]' Yes >IF YES, Complete Statin Intolerance form   Non-statin lipid lowering therapy '[x]' No '[]' Yes > Medication Name (check all that apply): '[]' Colesevelam (Welchol) '[]' Ezetimibe (Zetia) '[]' Fibrate '[]' Niacin '[]' PCSK9 inhibitor '[]' Omega 3 acid ethyl esters (Lovaza) '[]' Icosapent Ethyl (Vascepa) '[]' Over the counter omega 3 fatty acid or fish  oil supplement     Medication Currently Prescribed? If started since last visit: If not started since last visit: If stopped since last visit:  Diabetes Medications  SGLT2 Inhibitor  '[x]' No >  '[]' Yes > Since last visit, medication was: '[]' Stopped '[]' Not started '[]' Started '[]' Continued same medication '[]' Continued with medication changes Date started:        / /             MM DD YYYY Who prescribed? '[]' Cardiology provider  '[]' Study clinic '[]' Outside clinic '[]' Endocrinology      provider '[]' Primary care provider '[]' Other provider  Specify:                             '[]' Unknown Reason (check all that apply): '[]' eGFR <45 '[]' HbA1c<7% on metformin monotherapy OR already on GLP1RA and do not need to start another anti- hyperglycemic '[]' Already dehydrated '[]' Low blood pressure '[]' High risk of Hypoglycemia '[]' Prior DKA '[]' Recurrent mycotic genital infections '[]' History of or at risk for amputation '[]' Patient has experienced other side effects to this medication before '[]' Patient will be unable to adhere/monitor '[]' Patient unable to afford it '[]' Patient does not want to take this medication '[]' Pregnancy '[]' Other (specify: ) '[]' Unknown Reason Date discontinued:        / /             MM DD YYYY Reason (check all that apply  '[]' eGFR now <45 '[]' Dehydration '[]' Low blood pressure '[]' Hypoglycemia '[]' DKA '[]' Mycotic genital infection '[]' Amputation '[]' Other medication side effects '[]' Patient unable    to adhere/monitor  '[]' Had an operation/procedure     that required stopping it '[]' Patient unable to afford it '[]' Patient no longer wants to      take this medication '[]' Pregnancy '[]' Other (specify: ) '[]' Unknown Reason   If started or changed  Medication Name: '[]' Canaglifozin (Invokana) '[]' Dapagliflozin Wilder Glade) '[]' Empaglifozin (Jardiance) '[]' Ertugliflozin Actuary)           Medication Currently Prescribed? If started since last visit: If not started since last visit: If stopped since last visit:   GLP1 Receptor Agonist  '[x]' No >  '[]' Yes > Since last visit, medication was: '[]' Stopped '[]' Not started '[]' Started '[]' Continued same medication '[]' Continued with medication changes Date started:        / /             MM DD YYYY Who prescribed? '[]' Cardiology provider  '[]' Study clinic '[]' Outside clinic '[]' Endocrinology       provider '[]' Primary care provider '[]' Other provider > Specify:                             '[]'   Unknown Reason (check all that apply): '[]' Personal or family history of medullary thyroid cancer '[]' MEN2 '[]' HbA1c<7% on metformin monotherapy OR already on SGLT2i and do not need to start another anti-hyperglycemic '[]' eGFR now <30 '[]' High risk of Hypoglycemia '[]' History of pancreatitis '[]' Significant gastroparesis '[]' Prior gastric surgery '[]' Patient has experienced other side effects to this medication before '[]' Patient will be unable to adhere/monitor '[]' Patient unable to afford it '[]' Patient does not want to take this medication '[]' Pregnancy '[]' Other (specify: ) '[]' Unknown Reason Date discontinued:        / /             MM DD YYYY Reason (check all that apply): '[]' Medullary thyroid cancer '[]' MEN2 '[]' eGFR now <30 '[]' Hypoglycemia '[]' Pancreatitis '[]' Significant gastroparesis '[]' Gastric surgery '[]' Other medication side       effects '[]'   Patient  unable    to adhere/monitor                                  '[]' Had an operation/procedure that required stopping it '[]' Patient unable to afford it '[]' Patient no longer wants to take this medication '[]' Pregnancy '[]' Other (specify: ) '[]' Unknown Reason   If started or changed > Medication Name: '[]' Albiglutide (Tanzeum) '[]' Dulaglutide (Trulicity) '[]' Exanatide (Byetta, Bydureon) '[]' Liraglutide (Victoza, Saxenda) '[]' Lixisenatide (Adlyxin) '[]' Semaglutice (Ozempic)      Medication Currently Prescribed? If started since last visit: If not started since last visit: If stopped since last visit:  Other non Insulin diabetes medications '[x]' No '[]' Yes >  Medication Name (check all that apply): '[]' Acarbose (Precose) '[]' Miglitol (Glyset) '[]' Glimepiride (Amaryl) '[]' Glipizide (Amaryl) '[]' Glyburide (Diabeta,       Glynase,   Micronase) '[]' Metformin (Fortamet,        Glucophage[including XR],        Glumetza, Riomet) '[]' Pioglitazone (Actos) '[]' Nateglinide (Starlix) '[]' Pramlintide (Symilin) '[]' Repaglinide (Prandin) '[]' Rosiglitazone (Avandia) '[]' Alogliptin (Nesina) '[]' Linagliptin (Tradjenta) '[]' Saxagliptin (Onglyza) '[]' Sitagliptin (Januvia) '[]' Bromocriptine Quick Release (Cycloset)     Insulin '[]' No '[]'  Yes > total daily dose: units 55     STATIN INTOLERANCE (PER EHR/OTHER SOURCE DATA)  1. Was CK checked? '[]' No '[]' Yes   >If yes, select from the following: '[]' CK not elevated '[]' CK elevated 1-5x upper limit of normal '[]' CK elevated >5x upper limit of normal  2. Does the patient have muscle symptoms? '[]' No '[]' Yes    >If yes, select from the following: Location and pattern of muscle symptoms (select all that apply) '[]' Symmetric, hip flexors or thighs '[]' Symmetric, calves '[]' Symmetric, proximal upper extremity '[]' Asymmetric, intermittent, or not specific to any area '[]' Unknown   Timing of muscle symptom in relation to starting statin regimen '[]' <4 weeks '[]' 4-12 weeks '[]' >12 weeks '[]' Unknown   Timing of muscle symptoms improvement after withdrawal of statin '[]' <2 weeks '[]' 2-4 weeks '[]' No improvement after 4 weeks '[]' Unknown  3. Was patient re-challenged with a statin regimen (even if same statin compound or regimen as above)?  '[]' No  '[]' Yes  '[]' Unknown  >If yes, select from the following: Timing of recurrence of similar muscle symptoms in relation to starting second regimen '[]' <4 weeks '[]' 4-12 weeks '[]' >12 weeks '[]' Similar symptoms did not recur '[]' Unknown   [email or web address]

## 2019-09-03 ENCOUNTER — Other Ambulatory Visit: Payer: Self-pay

## 2019-09-03 DIAGNOSIS — G2581 Restless legs syndrome: Secondary | ICD-10-CM

## 2019-09-03 MED ORDER — PRAMIPEXOLE DIHYDROCHLORIDE 0.125 MG PO TABS: ORAL_TABLET | ORAL | 0 refills | Status: DC

## 2019-09-08 ENCOUNTER — Other Ambulatory Visit: Payer: Self-pay | Admitting: Pharmacist

## 2019-09-08 MED ORDER — WARFARIN SODIUM 5 MG PO TABS: ORAL_TABLET | ORAL | 1 refills | Status: DC

## 2019-09-15 ENCOUNTER — Other Ambulatory Visit (HOSPITAL_COMMUNITY): Payer: Self-pay | Admitting: Internal Medicine

## 2019-09-15 ENCOUNTER — Ambulatory Visit (HOSPITAL_COMMUNITY)
Admission: RE | Admit: 2019-09-15 | Discharge: 2019-09-15 | Disposition: A | Discharge status: Home / Self Care | Payer: Medicare Other | Source: Ambulatory Visit | Attending: Cardiology | Admitting: Cardiology

## 2019-09-15 ENCOUNTER — Other Ambulatory Visit (HOSPITAL_COMMUNITY): Payer: Self-pay | Admitting: Cardiovascular Disease

## 2019-09-15 ENCOUNTER — Other Ambulatory Visit: Payer: Self-pay

## 2019-09-15 DIAGNOSIS — I739 Peripheral vascular disease, unspecified: Secondary | ICD-10-CM | POA: Diagnosis not present

## 2019-09-28 ENCOUNTER — Other Ambulatory Visit: Payer: Self-pay

## 2019-09-28 DIAGNOSIS — E1152 Type 2 diabetes mellitus with diabetic peripheral angiopathy with gangrene: Secondary | ICD-10-CM

## 2019-09-28 DIAGNOSIS — Z794 Long term (current) use of insulin: Secondary | ICD-10-CM

## 2019-09-28 MED ORDER — LANTUS SOLOSTAR 100 UNIT/ML ~~LOC~~ SOPN: PEN_INJECTOR | SUBCUTANEOUS | 0 refills | Status: DC

## 2019-10-05 ENCOUNTER — Other Ambulatory Visit: Payer: Self-pay | Admitting: Family Medicine

## 2019-10-05 DIAGNOSIS — E113293 Type 2 diabetes mellitus with mild nonproliferative diabetic retinopathy without macular edema, bilateral: Secondary | ICD-10-CM | POA: Diagnosis not present

## 2019-10-05 DIAGNOSIS — Z961 Presence of intraocular lens: Secondary | ICD-10-CM | POA: Diagnosis not present

## 2019-10-05 DIAGNOSIS — Z794 Long term (current) use of insulin: Secondary | ICD-10-CM

## 2019-10-05 DIAGNOSIS — E1152 Type 2 diabetes mellitus with diabetic peripheral angiopathy with gangrene: Secondary | ICD-10-CM

## 2019-10-05 DIAGNOSIS — H35033 Hypertensive retinopathy, bilateral: Secondary | ICD-10-CM | POA: Diagnosis not present

## 2019-10-05 LAB — HM DIABETES EYE EXAM

## 2019-10-12 ENCOUNTER — Other Ambulatory Visit: Payer: Self-pay

## 2019-10-12 ENCOUNTER — Ambulatory Visit (INDEPENDENT_AMBULATORY_CARE_PROVIDER_SITE_OTHER): Payer: Medicare Other | Admitting: Pharmacist

## 2019-10-12 DIAGNOSIS — I480 Paroxysmal atrial fibrillation: Secondary | ICD-10-CM

## 2019-10-12 DIAGNOSIS — Z5181 Encounter for therapeutic drug level monitoring: Secondary | ICD-10-CM | POA: Diagnosis not present

## 2019-10-12 LAB — POCT INR: INR: 2.7 (ref 2.0–3.0)

## 2019-10-29 ENCOUNTER — Encounter: Payer: Self-pay | Admitting: Nurse Practitioner

## 2019-10-29 ENCOUNTER — Ambulatory Visit (INDEPENDENT_AMBULATORY_CARE_PROVIDER_SITE_OTHER): Payer: Medicare Other | Admitting: Nurse Practitioner

## 2019-10-29 ENCOUNTER — Other Ambulatory Visit: Payer: Self-pay

## 2019-10-29 VITALS — BP 133/56 | HR 78 | Ht 65.0 in | Wt 187.2 lb

## 2019-10-29 DIAGNOSIS — Z794 Long term (current) use of insulin: Secondary | ICD-10-CM

## 2019-10-29 DIAGNOSIS — Z951 Presence of aortocoronary bypass graft: Secondary | ICD-10-CM | POA: Diagnosis not present

## 2019-10-29 DIAGNOSIS — I480 Paroxysmal atrial fibrillation: Secondary | ICD-10-CM | POA: Diagnosis not present

## 2019-10-29 DIAGNOSIS — E118 Type 2 diabetes mellitus with unspecified complications: Secondary | ICD-10-CM

## 2019-10-29 DIAGNOSIS — I5022 Chronic systolic (congestive) heart failure: Secondary | ICD-10-CM

## 2019-10-29 LAB — POCT GLYCOSYLATED HEMOGLOBIN (HGB A1C)
HbA1c POC (<> result, manual entry): 7.4 % (ref 4.0–5.6)
HbA1c, POC (controlled diabetic range): 7.4 % — AB (ref 0.0–7.0)
HbA1c, POC (prediabetic range): 7.4 % — AB (ref 5.7–6.4)
Hemoglobin A1C: 7.4 % — AB (ref 4.0–5.6)

## 2019-10-29 LAB — POCT GLUCOSE (DEVICE FOR HOME USE)
Glucose Fasting, POC: 234 mg/dL — AB (ref 70–99)
POC Glucose: 234 mg/dl — AB (ref 70–99)

## 2019-10-29 MED ORDER — TORSEMIDE 20 MG PO TABS: 20.0000 mg | ORAL_TABLET | ORAL | 10 refills | Status: DC

## 2019-10-29 NOTE — Patient Instructions (Signed)

## 2019-10-29 NOTE — Progress Notes (Signed)
[name],   [id number] Phone:  [phone]   Fax:  [phone]   Established Patient Office Visit  Subjective:  Patient ID: William [name], male    DOB: [birth year]  Age: 69 y.o. MRN: [id number]  CC:  Chief Complaint  Patient presents with  . Follow-up    renewal of medication   . Diabetes    HPI William [name] presents for diabetes.  He is in with his daughter.  He  has a past medical history of AKI (acute kidney injury) ([place]), Chronic systolic CHF (congestive heart failure) ([place]), Diabetes mellitus without complication ([place]), adenomatous colonic polyps (04/07/2018), Hyperlipidemia, Hypertension, Paroxysmal atrial fibrillation ([place]), and STEMI (ST elevation myocardial infarction) ([place]) (2017).   Diabetes Mellitus Patient presents for follow up of diabetes. Current symptoms include: hyperglycemia. Symptoms have stabilized and progressed to a point and plateaued. Patient denies foot ulcerations, hypoglycemia , increased appetite, nausea, paresthesia of [place] feet, polydipsia, polyuria, vomiting and weight loss. Evaluation to date has included: fasting blood sugar, fasting lipid panel, hemoglobin A1C and microalbuminuria.  Home sugars: BGs are running  consistent with Hgb A1C. Current treatment: Continued insulin which has been somewhat effective.   His daughter states that she made this appointment because she was unable to get his torsemide refilled.  She admits that pharmacy had sent [place] prescription on several occasions however there was no response.  However upon further investigation.  I made her aware William torsemide was prescribed by cardiology.  Which indicates [place] refills were going to their office.  They are no longer doing daily weights for his heart failure because his weight was staying consistently at 187.  She denies swelling in his legs feet or ankles.  He denies headache, dizziness, visual changes, shortness of breath, dyspnea on  exertion, chest pain, nausea, vomiting or any edema.   Past Medical History:  Diagnosis Date  . AKI (acute kidney injury) ([place] [name])    With STEMI in 2017  . Chronic systolic CHF (congestive heart failure) ([place] [name])   . Diabetes mellitus without complication ([place] [name])   . Hx of adenomatous colonic polyps 04/07/2018  . Hyperlipidemia   . Hypertension   . Paroxysmal atrial fibrillation ([place] [name])   . STEMI (ST elevation myocardial infarction) ([place] [name]) 2017    Past Surgical History:  Procedure Laterality Date  . ABDOMINAL AORTOGRAM W/LOWER EXTREMITY N/A 09/19/2016   Procedure: Abdominal Aortogram w/Lower Extremity;  Surgeon: [place] Hampshire, MD;  Location: [facility];  Service: Cardiovascular;  Laterality: N/A;  . AMPUTATION Right 03/23/2016   Procedure: 1st and 2nd Ray Amputation Right Foot;  Surgeon: [place] Minion, MD;  Location: [facility];  Service: Orthopedics;  Laterality: Right;  . AMPUTATION Right 06/21/2016   Procedure: RIGHT TRANSMETATARSAL AMPUTATION;  Surgeon: [place] Minion, MD;  Location: [facility];  Service: Orthopedics;  Laterality: Right;  . CARDIAC CATHETERIZATION N/A 03/13/2016   Procedure: Right/Left Heart Cath and Coronary Angiography;  Surgeon: [place] Mocha, MD;  Location: [facility];  Service: Cardiovascular;  Laterality: N/A;  . CARDIAC CATHETERIZATION N/A 03/13/2016   Procedure: IABP Insertion;  Surgeon: [place] Mocha, MD;  Location: [facility];  Service: Cardiovascular;  Laterality: N/A;  . CATARACT EXTRACTION, BILATERAL    . William [name]   has had 3 neck surgeries from breaking his neck  . CORONARY ARTERY BYPASS GRAFT N/A 03/13/2016   Procedure: CORONARY ARTERY BYPASS GRAFTING (CABG) x 1 (SVG to  OM) with EVH from [facility];  Surgeon: [place] Poot, MD;  Location: [facility];  Service: Open Heart Surgery;  Laterality: N/A;  . LOWER EXTREMITY ANGIOGRAM  05/02/2016   Procedure: Lower Extremity Angiogram;  Surgeon: [place] Hampshire, MD;   Location: [facility];  Service: Cardiovascular;;  Limited left femoral runoff right femoral runoff  . PERIPHERAL VASCULAR CATHETERIZATION N/A 05/02/2016   Procedure: Abdominal Aortogram;  Surgeon: [place] Hampshire, MD;  Location: [facility];  Service: Cardiovascular;  Laterality: N/A;  . PERIPHERAL VASCULAR CATHETERIZATION Right 05/02/2016   Procedure: Peripheral Vascular Balloon Angioplasty;  Surgeon: [place] Hampshire, MD;  Location: [facility];  Service: Cardiovascular;  Laterality: Right;  SFA  . TEE WITHOUT CARDIOVERSION N/A 03/13/2016   Procedure: TRANSESOPHAGEAL ECHOCARDIOGRAM (TEE);  Surgeon: [place] Poot, MD;  Location: [facility];  Service: Open Heart Surgery;  Laterality: N/A;  . VSD REPAIR N/A 03/13/2016   Procedure: VENTRICULAR SEPTAL DEFECT (VSD) REPAIR;  Surgeon: [place] Poot, MD;  Location: [facility];  Service: Open Heart Surgery;  Laterality: N/A;    Family History  Problem Relation Age of Onset  . Diabetes Maternal Grandmother   . Diabetes Mother   . Aneurysm Mother   . Peripheral Artery Disease Mother   . Coronary artery disease Mother   . Peptic Ulcer Father   . Retinoblastoma Daughter   . Colon cancer Neg Hx   . Rectal cancer Neg Hx     Social History   Socioeconomic History  . Marital status: Married    Spouse name: Lavell Luster  . Number of children: Not on file  . Years of education: Not on file  . Highest education level: Not on file  Occupational History  . Not on file  Tobacco Use  . Smoking status: Former Smoker    Packs/day: 0.50    Types: Cigarettes    Quit date: 11/20/2015    Years since quitting: 3.9  . Smokeless tobacco: Never Used  . Tobacco comment: quit 2018  Substance and Sexual Activity  . Alcohol use: No  . Drug use: No  . Sexual activity: Not on file  Other Topics Concern  . 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  He is disabled from a neck fracture in 1994.  1 son and 2 daughters.      He is illiterate.      No alcohol or caffeine or drug use or other tobacco he is a former smoker.   Social Determinants of Health   Financial Resource Strain:   . Difficulty of Paying Living Expenses:   Food Insecurity:   . Worried About Charity fundraiser in [place] Last Year:   . Arboriculturist in [place] Last Year:   Transportation Needs:   . Film/video editor (Medical):   [name] Lack of Transportation (Non-Medical):   Physical Activity:   . Days of Exercise per Week:   . Minutes of Exercise per Session:   Stress:   . Feeling of Stress :   Social Connections:   . Frequency of Communication with Friends and Family:   . Frequency of Social Gatherings with Friends and Family:   . Attends Religious Services:   . Active Member of Clubs or Organizations:   . Attends Archivist Meetings:   .  Marital Status:   Intimate Partner Violence:   . Fear of Current or Ex-Partner:   . Emotionally Abused:   [name] Physically Abused:   . Sexually Abused:     Outpatient Medications Prior to Visit  Medication Sig Dispense Refill  . ACCU-CHEK AVIVA PLUS test strip TEST 3 TIMES DAILY PRIOR TO MEALS AND AT BEDTIME 100 strip 3  . acetaminophen (TYLENOL ARTHRITIS PAIN) 650 MG CR tablet Take 650 mg by mouth every 8 (eight) hours as needed for pain.    [name] albuterol (PROVENTIL HFA;VENTOLIN HFA) 108 (90 Base) MCG/ACT inhaler Inhale 2 puffs into [place] lungs every 6 (six) hours as needed for wheezing or shortness of breath. 1 Inhaler 0  . aspirin EC 81 MG tablet Take 1 tablet (81 mg total) by mouth daily. 30 tablet 3  . Blood Glucose Monitoring Suppl (ACCU-CHEK AVIVA PLUS) w/Device KIT 1 each by Does not apply route 4 (four) times daily -  before meals and at bedtime. 1 kit 0  . carvedilol (COREG) 3.125 MG tablet TAKE 1 TABLET(3.125 MG) BY MOUTH TWICE DAILY WITH A MEAL 180 tablet 0  . gabapentin (NEURONTIN) 300 MG  capsule TAKE 1 CAPSULE(300 MG) BY MOUTH THREE TIMES DAILY 90 capsule 2  . insulin glargine (LANTUS SOLOSTAR) 100 UNIT/ML Solostar Pen ADMINISTER 55 UNITS UNDER [place] SKIN AT BEDTIME. MUST MAKE APPT 18 mL 0  . insulin lispro (HUMALOG KWIKPEN) 100 UNIT/ML KwikPen ADMINISTER UP TO 10 UNITS UNDER [place] SKIN THREE TIMES DAILY AS DIRECTED PER SLIDING SCALE 27 mL 3  . Insulin Pen Needle 31G X 8 MM MISC 1 each by Does not apply route 4 (four) times daily. 100 each 12  . Lancets (ACCU-CHEK SOFT TOUCH) lancets Use as instructed 100 each 12  . pantoprazole (PROTONIX) 40 MG tablet TAKE 1 TABLET BY MOUTH TWICE DAILY BEFORE A MEAL AND 30 MINUTES BEFORE BREAKFAST AND SUPPER 60 tablet 5  . pramipexole (MIRAPEX) 0.125 MG tablet TAKE 1 TABLET(0.125 MG) BY MOUTH AT BEDTIME 90 tablet 0  . rosuvastatin (CRESTOR) 40 MG tablet TAKE 1 TABLET BY MOUTH EVERY DAY 90 tablet 3  . warfarin (COUMADIN) 5 MG tablet TAKE 1- 1 AND 1/2 TABLETS BY MOUTH DAILY AS DIRECTED BY COUMADIN CLINIC 180 tablet 1  . torsemide (DEMADEX) 20 MG tablet Take 1 tablet (20 mg total) by mouth 3 (three) times a week. 15 tablet 10  . diclofenac sodium (VOLTAREN) 1 % GEL Apply 2 g topically 4 (four) times daily. (Patient not taking: Reported on 10/29/2019) 150 g 2  . nitroGLYCERIN (NITRODUR - DOSED IN MG/24 HR) 0.4 mg/hr patch Place 1 patch (0.4 mg total) onto [place] skin daily. (Patient not taking: Reported on 10/29/2019) 30 patch 3  . silver sulfADIAZINE (SILVADENE) 1 % cream Apply 1 application topically daily. Apply to affected area daily plus dry dressing (Patient not taking: Reported on 10/29/2019) 400 g 3   No facility-administered medications prior to visit.    Allergies  Allergen Reactions  . Morphine And Related Shortness Of Breath and Other (See Comments)    UNSPECIFIED REACTION "Pt said it was too much"   . Latex Rash    ROS Review of Systems  All other systems reviewed and are negative.     Objective:    Physical Exam  Constitutional:  He is oriented to person, place, and time. He appears well-developed and well-nourished.  HENT:  Head: Normocephalic.  Cardiovascular: Normal rate, normal heart sounds and intact distal pulses.  1+ pitting  edema bilateral lower extremities  Pulmonary/Chest: Effort normal and breath sounds normal.  Musculoskeletal:        General: Normal range of motion.     Cervical back: Normal range of motion.  Neurological: He is alert and oriented to person, place, and time.  Skin: Skin is warm and dry.  Psychiatric: He has a normal mood and affect. His behavior is normal. Judgment and thought content normal.    BP (!) 133/56 (BP Location: Left Arm)   Pulse 78   Ht '5\' 5"'  (1.651 m)   Wt 187 lb 3.2 oz (84.9 kg)   SpO2 100%   BMI 31.15 kg/m  Wt Readings from Last 3 Encounters:  10/29/19 187 lb 3.2 oz (84.9 kg)  09/01/19 186 lb 9.6 oz (84.6 kg)  02/10/19 184 lb 3.2 oz (83.6 kg)     Health Maintenance Due  Topic Date Due  . COVID-19 Vaccine (1) Never done    There are no preventive care reminders to display for this patient.  Lab Results  Component Value Date   TSH 1.885 07/10/2011   Lab Results  Component Value Date   WBC 6.4 09/01/2019   HGB 12.6 (L) 09/01/2019   HCT 36.1 (L) 09/01/2019   MCV 86 09/01/2019   PLT 147 (L) 09/01/2019   Lab Results  Component Value Date   NA 141 09/01/2019   K 4.5 09/01/2019   CO2 25 09/01/2019   GLUCOSE 106 (H) 09/01/2019   BUN 35 (H) 09/01/2019   CREATININE 1.80 (H) 09/01/2019   BILITOT 0.4 01/05/2019   ALKPHOS 104 01/05/2019   AST 22 01/05/2019   ALT 24 01/05/2019   PROT 6.4 01/05/2019   ALBUMIN 4.3 01/05/2019   CALCIUM 9.8 09/01/2019   ANIONGAP 10 03/27/2017   Lab Results  Component Value Date   CHOL 90 (L) 01/05/2019   Lab Results  Component Value Date   HDL 34 (L) 01/05/2019   Lab Results  Component Value Date   LDLCALC 24 01/05/2019   Lab Results  Component Value Date   TRIG 159 (H) 01/05/2019   Lab Results    Component Value Date   CHOLHDL 2.6 01/05/2019   Lab Results  Component Value Date   HGBA1C 7.4 (A) 10/29/2019   HGBA1C 7.4 10/29/2019   HGBA1C 7.4 (A) 10/29/2019   HGBA1C 7.4 (A) 10/29/2019      Assessment & Plan:   Problem List Items Addressed This Visit      High   Chronic systolic HF (heart failure) ([place]) (Chronic)   Encourage patient to resume daily weights to evaluate additional week for torsemide.  Additional refills to be provided by cardiology. Relevant Medications   torsemide (DEMADEX) 20 MG tablet   Hx of CABG We will continue to follow-up with cardiology   Paroxysmal atrial fibrillation ([place] [name]) (Chronic)   Relevant Medications   torsemide (DEMADEX) 20 MG tablet   Type 2 diabetes mellitus with complication, with long-term current use of insulin ([place]) - Primary   Discussed diabetes and diet.  We will continue with current regimen and follow-up in 2 months.  Lipid panel to be obtained at that time. Relevant Orders   HgB A1c (Completed)   POCT Glucose (Device for Home Use) (Completed)   POCT Urinalysis Dipstick     Meds ordered this encounter  Medications  . torsemide (DEMADEX) 20 MG tablet    Sig: Take 1 tablet (20 mg total) by mouth 3 (three) times a week.    Dispense:  15 tablet    Refill:  10    Order Specific Question:   Supervising Provider    Answer:   Tresa Garter [[id number]]    Follow-up: Return in about 2 months (around 12/29/2019) for follow up.    Vevelyn Francois, NP

## 2019-10-30 ENCOUNTER — Encounter: Payer: Self-pay | Admitting: Nurse Practitioner

## 2019-11-02 ENCOUNTER — Other Ambulatory Visit: Payer: Self-pay

## 2019-11-02 DIAGNOSIS — E1152 Type 2 diabetes mellitus with diabetic peripheral angiopathy with gangrene: Secondary | ICD-10-CM

## 2019-11-02 MED ORDER — GABAPENTIN 300 MG PO CAPS: ORAL_CAPSULE | ORAL | 0 refills | Status: DC

## 2019-11-05 ENCOUNTER — Other Ambulatory Visit: Payer: Self-pay | Admitting: Family Medicine

## 2019-11-05 DIAGNOSIS — Z794 Long term (current) use of insulin: Secondary | ICD-10-CM

## 2019-11-05 DIAGNOSIS — E1152 Type 2 diabetes mellitus with diabetic peripheral angiopathy with gangrene: Secondary | ICD-10-CM

## 2019-11-06 ENCOUNTER — Other Ambulatory Visit: Payer: Self-pay

## 2019-11-06 DIAGNOSIS — Z794 Long term (current) use of insulin: Secondary | ICD-10-CM

## 2019-11-06 MED ORDER — LANTUS SOLOSTAR 100 UNIT/ML ~~LOC~~ SOPN: PEN_INJECTOR | SUBCUTANEOUS | 0 refills | Status: DC

## 2019-11-09 ENCOUNTER — Ambulatory Visit (INDEPENDENT_AMBULATORY_CARE_PROVIDER_SITE_OTHER): Payer: Medicare Other | Admitting: Pharmacist Clinician (PhC)/ Clinical Pharmacy Specialist

## 2019-11-09 ENCOUNTER — Other Ambulatory Visit: Payer: Self-pay

## 2019-11-09 DIAGNOSIS — I480 Paroxysmal atrial fibrillation: Secondary | ICD-10-CM

## 2019-11-09 DIAGNOSIS — N183 Chronic kidney disease, stage 3 unspecified: Secondary | ICD-10-CM | POA: Diagnosis not present

## 2019-11-09 DIAGNOSIS — Z5181 Encounter for therapeutic drug level monitoring: Secondary | ICD-10-CM | POA: Diagnosis not present

## 2019-11-09 DIAGNOSIS — N189 Chronic kidney disease, unspecified: Secondary | ICD-10-CM | POA: Diagnosis not present

## 2019-11-09 LAB — POCT INR: INR: 2.2 (ref 2.0–3.0)

## 2019-11-25 DIAGNOSIS — N183 Chronic kidney disease, stage 3 unspecified: Secondary | ICD-10-CM | POA: Diagnosis not present

## 2019-11-25 DIAGNOSIS — I129 Hypertensive chronic kidney disease with stage 1 through stage 4 chronic kidney disease, or unspecified chronic kidney disease: Secondary | ICD-10-CM | POA: Diagnosis not present

## 2019-11-25 DIAGNOSIS — D631 Anemia in chronic kidney disease: Secondary | ICD-10-CM | POA: Diagnosis not present

## 2019-11-25 DIAGNOSIS — E875 Hyperkalemia: Secondary | ICD-10-CM | POA: Diagnosis not present

## 2019-11-26 ENCOUNTER — Other Ambulatory Visit: Payer: Self-pay | Admitting: Nurse Practitioner

## 2019-11-26 ENCOUNTER — Telehealth: Payer: Self-pay | Admitting: *Deleted

## 2019-11-26 ENCOUNTER — Other Ambulatory Visit: Payer: Self-pay | Admitting: Cardiovascular Disease

## 2019-11-26 DIAGNOSIS — Z794 Long term (current) use of insulin: Secondary | ICD-10-CM

## 2019-11-26 NOTE — Telephone Encounter (Signed)
I called patient for 9 month phone call for Coordinate-Diabetes Study. Left message for patient to call me back.

## 2019-11-26 NOTE — Telephone Encounter (Signed)
Refill Request.  

## 2019-11-26 NOTE — Telephone Encounter (Signed)
Refill request for Gabapentin.

## 2019-12-09 ENCOUNTER — Other Ambulatory Visit: Payer: Self-pay | Admitting: Family Medicine

## 2019-12-09 DIAGNOSIS — Z794 Long term (current) use of insulin: Secondary | ICD-10-CM

## 2019-12-23 ENCOUNTER — Telehealth: Payer: Self-pay

## 2019-12-23 NOTE — Telephone Encounter (Signed)
lmomed to schedule appt

## 2019-12-26 ENCOUNTER — Other Ambulatory Visit: Payer: Self-pay | Admitting: Nurse Practitioner

## 2019-12-26 DIAGNOSIS — Z794 Long term (current) use of insulin: Secondary | ICD-10-CM

## 2019-12-28 ENCOUNTER — Other Ambulatory Visit: Payer: Self-pay | Admitting: Nurse Practitioner

## 2019-12-28 DIAGNOSIS — Z794 Long term (current) use of insulin: Secondary | ICD-10-CM

## 2019-12-28 MED ORDER — ACCU-CHEK AVIVA PLUS VI STRP: ORAL_STRIP | 3 refills | Status: DC

## 2019-12-28 NOTE — Telephone Encounter (Signed)
Please review. Thanks!  

## 2020-01-18 ENCOUNTER — Other Ambulatory Visit: Payer: Self-pay

## 2020-01-18 ENCOUNTER — Ambulatory Visit (INDEPENDENT_AMBULATORY_CARE_PROVIDER_SITE_OTHER): Payer: Medicare Other | Admitting: Pharmacist Clinician (PhC)/ Clinical Pharmacy Specialist

## 2020-01-18 DIAGNOSIS — I480 Paroxysmal atrial fibrillation: Secondary | ICD-10-CM | POA: Diagnosis not present

## 2020-01-18 DIAGNOSIS — Z5181 Encounter for therapeutic drug level monitoring: Secondary | ICD-10-CM

## 2020-01-18 LAB — POCT INR: INR: 3.7 — AB (ref 2.0–3.0)

## 2020-01-18 NOTE — Patient Instructions (Signed)
No warfarin today Monday July 19, then only 1 tablet Tuesday July 20.  Then continue with  1.5 tablets daily except for 1 tablet every Mondays and Fridays.  Recheck INR in 2 weeks (pt to eat salads again- romaine/spinach)

## 2020-01-21 ENCOUNTER — Telehealth: Payer: Self-pay | Admitting: *Deleted

## 2020-01-21 NOTE — Telephone Encounter (Signed)
I called patient for 12-month Coordinate-Diabetes Study.I left message for patient to call me back. 

## 2020-01-25 ENCOUNTER — Other Ambulatory Visit: Payer: Self-pay | Admitting: Internal Medicine

## 2020-01-25 ENCOUNTER — Other Ambulatory Visit: Payer: Self-pay | Admitting: Family Medicine

## 2020-01-25 DIAGNOSIS — Z794 Long term (current) use of insulin: Secondary | ICD-10-CM

## 2020-01-27 ENCOUNTER — Telehealth: Payer: Self-pay | Admitting: *Deleted

## 2020-01-27 DIAGNOSIS — Z006 Encounter for examination for normal comparison and control in clinical research program: Secondary | ICD-10-CM

## 2020-01-27 NOTE — Telephone Encounter (Signed)
Attempted to call pt for Corrdinate DM 12 month phone visit at 1600. Will try again as time permits.

## 2020-01-28 ENCOUNTER — Telehealth: Payer: Self-pay | Admitting: *Deleted

## 2020-01-28 DIAGNOSIS — I2510 Atherosclerotic heart disease of native coronary artery without angina pectoris: Secondary | ICD-10-CM

## 2020-01-28 NOTE — Telephone Encounter (Signed)
I called patient to complete 6-month phone call. I left message for patient to call me back.

## 2020-02-18 ENCOUNTER — Encounter: Payer: Self-pay | Admitting: *Deleted

## 2020-02-18 DIAGNOSIS — Z006 Encounter for examination for normal comparison and control in clinical research program: Secondary | ICD-10-CM

## 2020-02-18 NOTE — Research (Signed)
I completed the 12 month phone call for Coordinate- Diabetes Study.I spoke with the daughter who helps oversee her father's care. Patient is doing well and has not been hospitalized recently.I verified patient's medications. Daughter states patient follows diabetic diet closely. Patient checks blood sugar often and gives self insulin as needed. Patient follows closely with his doctor for management of blood sugar. This visit completes the study and I thanked them for their participation.

## 2020-02-22 ENCOUNTER — Telehealth: Payer: Self-pay | Admitting: Cardiovascular Disease

## 2020-02-22 ENCOUNTER — Ambulatory Visit (INDEPENDENT_AMBULATORY_CARE_PROVIDER_SITE_OTHER): Payer: Medicare Other | Admitting: Pharmacist

## 2020-02-22 ENCOUNTER — Other Ambulatory Visit: Payer: Self-pay

## 2020-02-22 DIAGNOSIS — Z5181 Encounter for therapeutic drug level monitoring: Secondary | ICD-10-CM

## 2020-02-22 DIAGNOSIS — I480 Paroxysmal atrial fibrillation: Secondary | ICD-10-CM | POA: Diagnosis not present

## 2020-02-22 LAB — POCT INR: INR: 2.5 (ref 2.0–3.0)

## 2020-02-22 NOTE — Telephone Encounter (Signed)
Daughter of the patient called. She would like a Nurse to go over instructions for how the patient is to take his medication. The daughter usually goes with the patient but she was unable to go today.

## 2020-02-22 NOTE — Telephone Encounter (Signed)
Spoke with the patients daughter, [name] who was confused about the patients Warfarin instructions. Reviewed the following with [name]. She verbalized understanding and thanked me for the call.   5 mg (5 mg x 1) every Mon, Fri; 7.5 mg (5 mg x 1.5) all other days

## 2020-02-22 NOTE — Patient Instructions (Signed)
Description   Continue with  1.5 tablets daily except for 1 tablet every Mondays and Fridays.  Recheck INR in 4 weeks (pt to eat salads again- romaine/spinach)

## 2020-02-24 ENCOUNTER — Other Ambulatory Visit: Payer: Self-pay | Admitting: Cardiology

## 2020-02-24 ENCOUNTER — Other Ambulatory Visit: Payer: Self-pay | Admitting: Family Medicine

## 2020-02-24 DIAGNOSIS — Z794 Long term (current) use of insulin: Secondary | ICD-10-CM

## 2020-03-08 ENCOUNTER — Encounter: Payer: Self-pay | Admitting: Cardiovascular Disease

## 2020-03-08 ENCOUNTER — Ambulatory Visit (INDEPENDENT_AMBULATORY_CARE_PROVIDER_SITE_OTHER): Payer: Medicare Other | Admitting: Pharmacist Clinician (PhC)/ Clinical Pharmacy Specialist

## 2020-03-08 ENCOUNTER — Other Ambulatory Visit: Payer: Self-pay

## 2020-03-08 ENCOUNTER — Ambulatory Visit (INDEPENDENT_AMBULATORY_CARE_PROVIDER_SITE_OTHER): Payer: Medicare Other | Admitting: Cardiovascular Disease

## 2020-03-08 VITALS — BP 118/60 | HR 67 | Ht 65.0 in | Wt 188.6 lb

## 2020-03-08 DIAGNOSIS — I5022 Chronic systolic (congestive) heart failure: Secondary | ICD-10-CM | POA: Diagnosis not present

## 2020-03-08 DIAGNOSIS — Z5181 Encounter for therapeutic drug level monitoring: Secondary | ICD-10-CM

## 2020-03-08 DIAGNOSIS — I480 Paroxysmal atrial fibrillation: Secondary | ICD-10-CM | POA: Diagnosis not present

## 2020-03-08 DIAGNOSIS — I2510 Atherosclerotic heart disease of native coronary artery without angina pectoris: Secondary | ICD-10-CM

## 2020-03-08 DIAGNOSIS — I739 Peripheral vascular disease, unspecified: Secondary | ICD-10-CM | POA: Diagnosis not present

## 2020-03-08 LAB — POCT INR: INR: 3.2 — AB (ref 2.0–3.0)

## 2020-03-08 NOTE — Patient Instructions (Signed)
Medication Instructions:  The current medical regimen is effective;  continue present plan and medications.  *If you need a refill on your cardiac medications before your next appointment, please call your pharmacy*   Follow-Up: At [facility], you and your health needs are our priority.  As part of our continuing mission to provide you with exceptional heart care, we have created designated Provider Care Teams.  These Care Teams include your primary Cardiologist (physician) and Advanced Practice Providers (APPs -  Physician Assistants and Nurse Practitioners) who all work together to provide you with the care you need, when you need it.  We recommend signing up for the patient portal called "MyChart".  Sign up information is provided on this After Visit Summary.  MyChart is used to connect with patients for Virtual Visits (Telemedicine).  Patients are able to view lab/test results, encounter notes, upcoming appointments, etc.  Non-urgent messages can be sent to your provider as well.   To learn more about what you can do with MyChart, go to [email or web address]://[email or web address].com.    Your next appointment:   6 month(s)  The format for your next appointment:   In Person  Provider:   Muhammad Arida, MD     

## 2020-03-08 NOTE — Patient Instructions (Signed)
Take just 1/2 tablet today Tuesday Sept 7, then continue with 1.5 tablets daily except for 1 tablet every Mondays and Fridays.  Recheck INR in 3 weeks

## 2020-03-08 NOTE — [facility] Notes (Signed)
Cardiology Office Note   Date:  03/08/2020   ID:  William [name], DOB 07/20/1950, MRN [id number]  PCP:  Vevelyn Francois, NP  Cardiologist:   [name], MD   No chief complaint on file.     History of Present Illness: William [name] is a 69 y.o. male who Is here today for a follow-up visit regarding peripheral arterial disease, coronary artery disease and paroxysmal atrial fibrillation. He has known history of inferior myocardial infarction complicated by VSD in September, 2017. He is status post one-vessel CABG and VSD repair. He had postoperative atrial fibrillation and has been on anticoagulation. He had amputation of the right great toe while hospitalized for gangrene. The patient has known history of diabetes.  He is known to have peripheral arterial disease. Angiography in November, 2017 showed no significant aortoiliac disease, significant right distal SFA stenosis and one-vessel runoff below the knee via the peroneal artery with reconstitution of the dorsalis pedis distally. I performed successful drug-coated balloon angioplasty of the right SFA. He is s/p right transmetatarsal amputation for osteomyelitis.    He had angiography to the left leg in March 2018 which showed moderate stenosis affecting the left popliteal artery and TP trunk with 1 vessel runoff below the knee via the peroneal artery which was a large dominant vessel and reconstituted the dorsalis pedis and posterior tibial at the level of the ankle. There was brisk flow to the toes. Thus, no revascularization was performed.  He has been doing well with no recent chest pain, shortness of breath or palpitations.  No significant leg claudication or ulceration.   Past Medical History:  Diagnosis Date  . AKI (acute kidney injury) ([place])    With STEMI in 2017  . Chronic systolic CHF (congestive heart failure) ([place])   . Diabetes mellitus without complication ([place])   . Hx of adenomatous colonic polyps 04/07/2018  .  Hyperlipidemia   . Hypertension   . Paroxysmal atrial fibrillation ([name])   . STEMI (ST elevation myocardial infarction) ([place]) 2017    Past Surgical History:  Procedure Laterality Date  . ABDOMINAL AORTOGRAM W/LOWER EXTREMITY N/A 09/19/2016   Procedure: Abdominal Aortogram w/Lower Extremity;  Surgeon: Wellington Hampshire, MD;  Location: [facility];  Service: Cardiovascular;  Laterality: N/A;  . AMPUTATION Right 03/23/2016   Procedure: 1st and 2nd Ray Amputation Right Foot;  Surgeon: Newt Minion, MD;  Location: [facility];  Service: Orthopedics;  Laterality: Right;  . AMPUTATION Right 06/21/2016   Procedure: RIGHT TRANSMETATARSAL AMPUTATION;  Surgeon: Newt Minion, MD;  Location: [facility];  Service: Orthopedics;  Laterality: Right;  . CARDIAC CATHETERIZATION N/A 03/13/2016   Procedure: Right/Left Heart Cath and Coronary Angiography;  Surgeon: Sherren Mocha, MD;  Location: [facility];  Service: Cardiovascular;  Laterality: N/A;  . CARDIAC CATHETERIZATION N/A 03/13/2016   Procedure: IABP Insertion;  Surgeon: Sherren Mocha, MD;  Location: [facility];  Service: Cardiovascular;  Laterality: N/A;  . CATARACT EXTRACTION, BILATERAL    . [place]   has had 3 neck surgeries from breaking his neck  . CORONARY ARTERY BYPASS GRAFT N/A 03/13/2016   Procedure: CORONARY ARTERY BYPASS GRAFTING (CABG) x 1 (SVG to OM) with EVH from [place];  Surgeon: Ivin Poot, MD;  Location: [facility];  Service: Open Heart Surgery;  Laterality: N/A;  . LOWER EXTREMITY ANGIOGRAM  05/02/2016   Procedure: Lower Extremity Angiogram;  Surgeon: Wellington Hampshire, MD;  Location: [facility];  Service: Cardiovascular;;  Limited left femoral runoff right femoral runoff  . PERIPHERAL VASCULAR CATHETERIZATION N/A 05/02/2016   Procedure: Abdominal Aortogram;  Surgeon: Wellington Hampshire, MD;  Location: [facility];  Service: Cardiovascular;  Laterality: N/A;  . PERIPHERAL  VASCULAR CATHETERIZATION Right 05/02/2016   Procedure: Peripheral Vascular Balloon Angioplasty;  Surgeon: Wellington Hampshire, MD;  Location: [facility];  Service: Cardiovascular;  Laterality: Right;  SFA  . TEE WITHOUT CARDIOVERSION N/A 03/13/2016   Procedure: TRANSESOPHAGEAL ECHOCARDIOGRAM (TEE);  Surgeon: Ivin Poot, MD;  Location: [facility];  Service: Open Heart Surgery;  Laterality: N/A;  . VSD REPAIR N/A 03/13/2016   Procedure: VENTRICULAR SEPTAL DEFECT (VSD) REPAIR;  Surgeon: Ivin Poot, MD;  Location: [facility];  Service: Open Heart Surgery;  Laterality: N/A;     Current Outpatient Medications  Medication Sig Dispense Refill  . acetaminophen (TYLENOL ARTHRITIS PAIN) 650 MG CR tablet Take 650 mg by mouth every 8 (eight) hours as needed for pain.    [name] albuterol (PROVENTIL HFA;VENTOLIN HFA) 108 (90 Base) MCG/ACT inhaler Inhale 2 puffs into the lungs every 6 (six) hours as needed for wheezing or shortness of breath. 1 Inhaler 0  . aspirin EC 81 MG tablet Take 1 tablet (81 mg total) by mouth daily. 30 tablet 3  . Blood Glucose Monitoring Suppl (ACCU-CHEK AVIVA PLUS) w/Device KIT 1 each by Does not apply route 4 (four) times daily -  before meals and at bedtime. 1 kit 0  . carvedilol (COREG) 3.125 MG tablet TAKE 1 TABLET(3.125 MG) BY MOUTH TWICE DAILY WITH A MEAL 180 tablet 1  . gabapentin (NEURONTIN) 300 MG capsule TAKE 1 CAPSULE(300 MG) BY MOUTH THREE TIMES DAILY 90 capsule 0  . glucose blood (ACCU-CHEK AVIVA PLUS) test strip TEST 3 TIMES DAILY PRIOR TO MEALS AND AT BEDTIME 100 strip 3  . insulin glargine (LANTUS SOLOSTAR) 100 UNIT/ML Solostar Pen ADMINISTER 55 UNITS UNDER THE SKIN AT BEDTIME 18 mL 2  . insulin lispro (HUMALOG KWIKPEN) 100 UNIT/ML KwikPen ADMINISTER UP TO 10 UNITS UNDER THE SKIN THREE TIMES DAILY AS DIRECTED PER SLIDING SCALE 27 mL 3  . Insulin Pen Needle 31G X 8 MM MISC 1 each by Does not apply route 4 (four) times daily. 100 each 12  . Lancets (ACCU-CHEK SOFT TOUCH)  lancets Use as instructed 100 each 12  . pantoprazole (PROTONIX) 40 MG tablet TAKE 1 TABLET BY MOUTH TWICE DAILY BEFORE A MEAL AND 30 MINUTES BEFORE BREAKFAST AND SUPPER 180 tablet 0  . pramipexole (MIRAPEX) 0.125 MG tablet TAKE 1 TABLET(0.125 MG) BY MOUTH AT BEDTIME 90 tablet 0  . rosuvastatin (CRESTOR) 40 MG tablet TAKE 1 TABLET BY MOUTH EVERY DAY 90 tablet 3  . silver sulfADIAZINE (SILVADENE) 1 % cream Apply 1 application topically daily. Apply to affected area daily plus dry dressing (Patient not taking: Reported on 10/29/2019) 400 g 3  . torsemide (DEMADEX) 20 MG tablet Take 1 tablet (20 mg total) by mouth 3 (three) times a week. 15 tablet 10  . warfarin (COUMADIN) 5 MG tablet TAKE 1 TO 1 AND 1/2 TABLET BY MOUTH DAILY AS DIRECTED BY COUMADIN CLINIC 180 tablet 1   No current facility-administered medications for this visit.    Allergies:   Morphine and related and Latex    Social History:  The patient  reports that he quit smoking about 4 years ago. His smoking use included cigarettes. He smoked 0.50 packs  per day. He has never used smokeless tobacco. He reports that he does not drink alcohol and does not use drugs.   Family History:  Not able to obtain due to distress.  ROS:  Please see the history of present illness.   Otherwise, review of systems are positive for none.   All other systems are reviewed and negative.    PHYSICAL EXAM: VS:  BP 118/60   Pulse 67   Ht '5\' 5"'  (1.651 m)   Wt 188 lb 9.6 oz (85.5 kg)   SpO2 96%   BMI 31.38 kg/m  , BMI Body mass index is 31.38 kg/m. GEN: Well nourished, well developed, in no acute distress  HEENT: normal  Neck: no JVD, carotid bruits, or masses Cardiac: RRR; no  rubs, or gallops,no edema . No murmurs Respiratory:  clear to auscultation bilaterally, normal work of breathing GI: soft, nontender, nondistended, + BS MS: no deformity or atrophy  Skin: warm and dry, no rash Neuro:  Strength and sensation are intact Psych: euthymic mood,  full affect Vascular: Distal pulses are not palpable.  No ulceration.  EKG:  EKG is not ordered today.   Recent Labs: 09/01/2019: BUN 35; Creatinine, Ser 1.80; Hemoglobin 12.6; Platelets 147; Potassium 4.5; Sodium 141    Lipid Panel    Component Value Date/Time   CHOL 90 (L) 01/05/2019 1123   TRIG 159 (H) 01/05/2019 1123   HDL 34 (L) 01/05/2019 1123   CHOLHDL 2.6 01/05/2019 1123   CHOLHDL 3.0 03/20/2017 1144   VLDL 29 03/13/2016 1057   LDLCALC 24 01/05/2019 1123   LDLCALC 45 03/20/2017 1144      Wt Readings from Last 3 Encounters:  03/08/20 188 lb 9.6 oz (85.5 kg)  10/29/19 187 lb 3.2 oz (84.9 kg)  09/01/19 186 lb 9.6 oz (84.6 kg)      No flowsheet data found.     ASSESSMENT AND PLAN:  1.  Peripheral arterial disease :  He is status post drug-coated balloon angioplasty of the right SFA in 2017. He has significant tibial disease bilaterally. Currently he has no ulceration or claudication. Continue medical therapy. Noninvasive vascular studies last year showed normal ABI on the right side with patent right SFA.  2. Chronic systolic heart failure: He appears to be euvolemic.   Continue small dose carvedilol.  He is no longer on losartan due to low blood pressure and chronic kidney disease.  Most recent echo in 2018 showed an EF of 45 to 50%.  3. Paroxysmal atrial fibrillation: He is maintaining in sinus rhythm.  He is on long-term anticoagulation with warfarin with no bleeding complications.    4. Coronary artery disease involving native coronary arteries without angina:  status post one-vessel CABG and VSD repair. Continue medical therapy.  5. Diabetes mellitus: Improved from before.  6. Hyperlipidemia: Continue high-dose rosuvastatin.  Most recent lipid profile showed an LDL of 24.  7.  The patient has not received COVID-19 vaccination.  I discussed with him risk and benefits but he and his daughter are completely opposed to this vaccine and they would not consider it  until it has been approved for at least few years.   Disposition:   follow-up with me in 6 months  Signed,  [name], MD  03/08/2020 1:34 PM    [facility]

## 2020-03-13 ENCOUNTER — Other Ambulatory Visit: Payer: Self-pay | Admitting: Family Medicine

## 2020-03-13 DIAGNOSIS — Z794 Long term (current) use of insulin: Secondary | ICD-10-CM

## 2020-03-18 NOTE — Telephone Encounter (Signed)
Please review  Thank you

## 2020-03-28 ENCOUNTER — Ambulatory Visit (INDEPENDENT_AMBULATORY_CARE_PROVIDER_SITE_OTHER): Payer: Medicare Other

## 2020-03-28 ENCOUNTER — Other Ambulatory Visit: Payer: Self-pay

## 2020-03-28 DIAGNOSIS — I480 Paroxysmal atrial fibrillation: Secondary | ICD-10-CM | POA: Diagnosis not present

## 2020-03-28 DIAGNOSIS — Z5181 Encounter for therapeutic drug level monitoring: Secondary | ICD-10-CM | POA: Diagnosis not present

## 2020-03-28 LAB — POCT INR: INR: 3 (ref 2.0–3.0)

## 2020-03-28 NOTE — Patient Instructions (Signed)
Take just 1/2 tablet today and  then continue with 1.5 tablets daily except for 1 tablet every Mondays and Fridays. Be consistent with greens.  Recheck INR in 4 weeks

## 2020-04-18 ENCOUNTER — Other Ambulatory Visit: Payer: Self-pay | Admitting: Nurse Practitioner

## 2020-04-18 DIAGNOSIS — Z794 Long term (current) use of insulin: Secondary | ICD-10-CM

## 2020-04-18 MED ORDER — GABAPENTIN 300 MG PO CAPS: 300.0000 mg | ORAL_CAPSULE | Freq: Two times a day (BID) | ORAL | 3 refills | Status: DC

## 2020-04-18 MED ORDER — ACCU-CHEK AVIVA PLUS VI STRP: ORAL_STRIP | 11 refills | Status: DC

## 2020-04-25 ENCOUNTER — Ambulatory Visit (INDEPENDENT_AMBULATORY_CARE_PROVIDER_SITE_OTHER): Payer: Medicare Other | Admitting: Pharmacist

## 2020-04-25 DIAGNOSIS — I480 Paroxysmal atrial fibrillation: Secondary | ICD-10-CM

## 2020-04-25 LAB — POCT INR: INR: 3.7 — AB (ref 2.0–3.0)

## 2020-05-09 ENCOUNTER — Other Ambulatory Visit: Payer: Self-pay

## 2020-05-09 ENCOUNTER — Ambulatory Visit (INDEPENDENT_AMBULATORY_CARE_PROVIDER_SITE_OTHER): Payer: Medicare Other

## 2020-05-09 DIAGNOSIS — I480 Paroxysmal atrial fibrillation: Secondary | ICD-10-CM

## 2020-05-09 DIAGNOSIS — Z5181 Encounter for therapeutic drug level monitoring: Secondary | ICD-10-CM | POA: Diagnosis not present

## 2020-05-09 LAB — POCT INR: INR: 2.5 (ref 2.0–3.0)

## 2020-05-09 NOTE — Patient Instructions (Signed)
continue with 1.5 tablets daily except for 1 tablet every Mondays and Fridays. Be consistent with greens.  Recheck INR in 4 weeks

## 2020-05-22 ENCOUNTER — Other Ambulatory Visit: Payer: Self-pay | Admitting: Cardiovascular Disease

## 2020-05-23 NOTE — Telephone Encounter (Signed)
Refill request

## 2020-05-25 DIAGNOSIS — N183 Chronic kidney disease, stage 3 unspecified: Secondary | ICD-10-CM | POA: Diagnosis not present

## 2020-05-25 DIAGNOSIS — D509 Iron deficiency anemia, unspecified: Secondary | ICD-10-CM | POA: Diagnosis not present

## 2020-05-30 DIAGNOSIS — N1832 Chronic kidney disease, stage 3b: Secondary | ICD-10-CM | POA: Diagnosis not present

## 2020-05-30 DIAGNOSIS — D631 Anemia in chronic kidney disease: Secondary | ICD-10-CM | POA: Diagnosis not present

## 2020-05-30 DIAGNOSIS — I129 Hypertensive chronic kidney disease with stage 1 through stage 4 chronic kidney disease, or unspecified chronic kidney disease: Secondary | ICD-10-CM | POA: Diagnosis not present

## 2020-05-30 DIAGNOSIS — E875 Hyperkalemia: Secondary | ICD-10-CM | POA: Diagnosis not present

## 2020-05-31 ENCOUNTER — Other Ambulatory Visit: Payer: Self-pay | Admitting: Internal Medicine

## 2020-06-01 ENCOUNTER — Other Ambulatory Visit: Payer: Self-pay | Admitting: Nurse Practitioner

## 2020-06-01 NOTE — Telephone Encounter (Signed)
Pt needs apt. Another provider filled this in the past and they told him that he needs an apt also.  Thanks

## 2020-06-02 ENCOUNTER — Other Ambulatory Visit: Payer: Self-pay | Admitting: Nurse Practitioner

## 2020-06-02 MED ORDER — PANTOPRAZOLE SODIUM 40 MG PO TBEC: DELAYED_RELEASE_TABLET | ORAL | 1 refills | Status: DC

## 2020-06-06 ENCOUNTER — Other Ambulatory Visit: Payer: Self-pay

## 2020-06-06 ENCOUNTER — Ambulatory Visit (INDEPENDENT_AMBULATORY_CARE_PROVIDER_SITE_OTHER): Payer: Medicare Other

## 2020-06-06 DIAGNOSIS — I480 Paroxysmal atrial fibrillation: Secondary | ICD-10-CM

## 2020-06-06 DIAGNOSIS — Z5181 Encounter for therapeutic drug level monitoring: Secondary | ICD-10-CM | POA: Diagnosis not present

## 2020-06-06 LAB — POCT INR: INR: 2.4 (ref 2.0–3.0)

## 2020-06-06 NOTE — Patient Instructions (Signed)
continue with 1.5 tablets daily except for 1 tablet every Mondays and Fridays.  Recheck INR in 6 weeks

## 2020-06-07 NOTE — Telephone Encounter (Signed)
Please see refill request.

## 2020-07-11 ENCOUNTER — Other Ambulatory Visit: Payer: Self-pay | Admitting: Family Medicine

## 2020-07-11 DIAGNOSIS — E1152 Type 2 diabetes mellitus with diabetic peripheral angiopathy with gangrene: Secondary | ICD-10-CM

## 2020-07-12 NOTE — Telephone Encounter (Signed)
Please see patient refill request.

## 2020-08-01 ENCOUNTER — Other Ambulatory Visit: Payer: Self-pay

## 2020-08-01 ENCOUNTER — Ambulatory Visit (INDEPENDENT_AMBULATORY_CARE_PROVIDER_SITE_OTHER): Payer: Medicare Other

## 2020-08-01 DIAGNOSIS — Z5181 Encounter for therapeutic drug level monitoring: Secondary | ICD-10-CM

## 2020-08-01 DIAGNOSIS — I480 Paroxysmal atrial fibrillation: Secondary | ICD-10-CM

## 2020-08-01 LAB — POCT INR: INR: 3.3 — AB (ref 2.0–3.0)

## 2020-08-01 NOTE — Patient Instructions (Signed)
Hold tonight only and then continue with 1.5 tablets daily except for 1 tablet every Mondays and Fridays.  Recheck INR in 6 weeks

## 2020-09-08 ENCOUNTER — Other Ambulatory Visit: Payer: Self-pay | Admitting: Family Medicine

## 2020-09-08 DIAGNOSIS — E1152 Type 2 diabetes mellitus with diabetic peripheral angiopathy with gangrene: Secondary | ICD-10-CM

## 2020-09-12 ENCOUNTER — Telehealth: Payer: Self-pay

## 2020-09-12 NOTE — Telephone Encounter (Signed)
lpmtcb to reschedule INR check.

## 2020-09-16 ENCOUNTER — Other Ambulatory Visit: Payer: Self-pay | Admitting: Family Medicine

## 2020-09-16 ENCOUNTER — Telehealth: Payer: Self-pay

## 2020-09-16 DIAGNOSIS — E1152 Type 2 diabetes mellitus with diabetic peripheral angiopathy with gangrene: Secondary | ICD-10-CM

## 2020-09-16 DIAGNOSIS — Z794 Long term (current) use of insulin: Secondary | ICD-10-CM

## 2020-09-16 MED ORDER — LANTUS SOLOSTAR 100 UNIT/ML ~~LOC~~ SOPN: PEN_INJECTOR | SUBCUTANEOUS | 0 refills | Status: DC

## 2020-09-16 NOTE — Telephone Encounter (Signed)
Refill on his lantex insulin 64ml

## 2020-09-16 NOTE — Telephone Encounter (Signed)
Insulin refilled and appointment scheduled.

## 2020-09-19 ENCOUNTER — Other Ambulatory Visit: Payer: Self-pay

## 2020-09-19 ENCOUNTER — Encounter: Payer: Self-pay | Admitting: Nurse Practitioner

## 2020-09-19 ENCOUNTER — Ambulatory Visit (HOSPITAL_COMMUNITY)
Admission: RE | Admit: 2020-09-19 | Payer: Medicare Other | Source: Ambulatory Visit | Attending: Cardiovascular Disease | Admitting: Cardiovascular Disease

## 2020-09-19 ENCOUNTER — Ambulatory Visit (INDEPENDENT_AMBULATORY_CARE_PROVIDER_SITE_OTHER): Payer: Medicare Other | Admitting: Nurse Practitioner

## 2020-09-19 ENCOUNTER — Ambulatory Visit (INDEPENDENT_AMBULATORY_CARE_PROVIDER_SITE_OTHER): Payer: Medicare Other | Admitting: Pharmacist Clinician (PhC)/ Clinical Pharmacy Specialist

## 2020-09-19 DIAGNOSIS — E1152 Type 2 diabetes mellitus with diabetic peripheral angiopathy with gangrene: Secondary | ICD-10-CM | POA: Diagnosis not present

## 2020-09-19 DIAGNOSIS — I480 Paroxysmal atrial fibrillation: Secondary | ICD-10-CM | POA: Diagnosis not present

## 2020-09-19 DIAGNOSIS — Z794 Long term (current) use of insulin: Secondary | ICD-10-CM | POA: Diagnosis not present

## 2020-09-19 LAB — POCT GLYCOSYLATED HEMOGLOBIN (HGB A1C): Hemoglobin A1C: 7.3 % — AB (ref 4.0–5.6)

## 2020-09-19 LAB — POCT INR: INR: 4.3 — AB (ref 2.0–3.0)

## 2020-09-19 MED ORDER — INSULIN LISPRO (1 UNIT DIAL) 100 UNIT/ML (KWIKPEN): PEN_INJECTOR | SUBCUTANEOUS | 3 refills | Status: DC

## 2020-09-19 MED ORDER — LANTUS SOLOSTAR 100 UNIT/ML ~~LOC~~ SOPN: PEN_INJECTOR | SUBCUTANEOUS | 0 refills | Status: DC

## 2020-09-19 MED ORDER — INSULIN PEN NEEDLE 31G X 8 MM MISC: 1.0000 | Freq: Four times a day (QID) | 12 refills | Status: DC

## 2020-09-19 NOTE — Patient Instructions (Signed)
Diabetes Mellitus and Nutrition, Adult When you have diabetes, or diabetes mellitus, it is very important to have healthy eating habits because your blood sugar (glucose) levels are greatly affected by what you eat and drink. Eating healthy foods in the right amounts, at about the same times every day, can help you:  Control your blood glucose.  Lower your risk of heart disease.  Improve your blood pressure.  Reach or maintain a healthy weight. What can affect my meal plan? Every person with diabetes is different, and each person has different needs for a meal plan. Your health care provider may recommend that you work with a dietitian to make a meal plan that is best for you. Your meal plan may vary depending on factors such as:  The calories you need.  The medicines you take.  Your weight.  Your blood glucose, blood pressure, and cholesterol levels.  Your activity level.  Other health conditions you have, such as heart or kidney disease. How do carbohydrates affect me? Carbohydrates, also called carbs, affect your blood glucose level more than any other type of food. Eating carbs naturally raises the amount of glucose in your blood. Carb counting is a method for keeping track of how many carbs you eat. Counting carbs is important to keep your blood glucose at a healthy level, especially if you use insulin or take certain oral diabetes medicines. It is important to know how many carbs you can safely have in each meal. This is different for every person. Your dietitian can help you calculate how many carbs you should have at each meal and for each snack. How does alcohol affect me? Alcohol can cause a sudden decrease in blood glucose (hypoglycemia), especially if you use insulin or take certain oral diabetes medicines. Hypoglycemia can be a life-threatening condition. Symptoms of hypoglycemia, such as sleepiness, dizziness, and confusion, are similar to symptoms of having too much  alcohol.  Do not drink alcohol if: ? Your health care provider tells you not to drink. ? You are pregnant, may be pregnant, or are planning to become pregnant.  If you drink alcohol: ? Do not drink on an empty stomach. ? Limit how much you use to:  0-1 drink a day for women.  0-2 drinks a day for men. ? Be aware of how much alcohol is in your drink. In the [place] one drink equals one 12 oz bottle of beer (355 mL), one 5 oz glass of wine (148 mL), or one 1 oz glass of hard liquor (44 mL). ? Keep yourself hydrated with water, diet soda, or unsweetened iced tea.  Keep in mind that regular soda, juice, and other mixers may contain a lot of sugar and must be counted as carbs. What are tips for following this plan? Reading food labels  Start by checking the serving size on the "Nutrition Facts" label of packaged foods and drinks. The amount of calories, carbs, fats, and other nutrients listed on the label is based on one serving of the item. Many items contain more than one serving per package.  Check the total grams (g) of carbs in one serving. You can calculate the number of servings of carbs in one serving by dividing the total carbs by 15. For example, if a food has 30 g of total carbs per serving, it would be equal to 2 servings of carbs.  Check the number of grams (g) of saturated fats and trans fats in one serving. Choose foods that have   a low amount or none of these fats.  Check the number of milligrams (mg) of salt (sodium) in one serving. Most people should limit total sodium intake to less than 2,300 mg per day.  Always check the nutrition information of foods labeled as "low-fat" or "nonfat." These foods may be higher in added sugar or refined carbs and should be avoided.  Talk to your dietitian to identify your daily goals for nutrients listed on the label. Shopping  Avoid buying canned, pre-made, or processed foods. These foods tend to be high in fat, sodium, and added  sugar.  Shop around the outside edge of the grocery store. This is where you will most often find fresh fruits and vegetables, bulk grains, fresh meats, and fresh dairy. Cooking  Use low-heat cooking methods, such as baking, instead of high-heat cooking methods like deep frying.  Cook using healthy oils, such as olive, canola, or sunflower oil.  Avoid cooking with butter, cream, or high-fat meats. Meal planning  Eat meals and snacks regularly, preferably at the same times every day. Avoid going long periods of time without eating.  Eat foods that are high in fiber, such as fresh fruits, vegetables, beans, and whole grains. Talk with your dietitian about how many servings of carbs you can eat at each meal.  Eat 4-6 oz (112-168 g) of lean protein each day, such as lean meat, chicken, fish, eggs, or tofu. One ounce (oz) of lean protein is equal to: ? 1 oz (28 g) of meat, chicken, or fish. ? 1 egg. ?  cup (62 g) of tofu.  Eat some foods each day that contain healthy fats, such as avocado, nuts, seeds, and fish.   What foods should I eat? Fruits Berries. Apples. Oranges. Peaches. Apricots. Plums. Grapes. Mango. Papaya. Pomegranate. Kiwi. Cherries. Vegetables Lettuce. Spinach. Leafy greens, including kale, chard, collard greens, and mustard greens. Beets. Cauliflower. Cabbage. Broccoli. Carrots. Green beans. Tomatoes. Peppers. Onions. Cucumbers. Brussels sprouts. Grains Whole grains, such as whole-wheat or whole-grain bread, crackers, tortillas, cereal, and pasta. Unsweetened oatmeal. Quinoa. Brown or wild rice. Meats and other proteins Seafood. Poultry without skin. Lean cuts of poultry and beef. Tofu. Nuts. Seeds. Dairy Low-fat or fat-free dairy products such as milk, yogurt, and cheese. The items listed above may not be a complete list of foods and beverages you can eat. Contact a dietitian for more information. What foods should I avoid? Fruits Fruits canned with  syrup. Vegetables Canned vegetables. Frozen vegetables with butter or cream sauce. Grains Refined white flour and flour products such as bread, pasta, snack foods, and cereals. Avoid all processed foods. Meats and other proteins Fatty cuts of meat. Poultry with skin. Breaded or fried meats. Processed meat. Avoid saturated fats. Dairy Full-fat yogurt, cheese, or milk. Beverages Sweetened drinks, such as soda or iced tea. The items listed above may not be a complete list of foods and beverages you should avoid. Contact a dietitian for more information. Questions to ask a health care provider  Do I need to meet with a diabetes educator?  Do I need to meet with a dietitian?  What number can I call if I have questions?  When are the best times to check my blood glucose? Where to find more information:  American Diabetes Association: diabetes.org  Academy of Nutrition and Dietetics: www.eatright.org  National Institute of Diabetes and Digestive and Kidney Diseases: www.niddk.nih.gov  Association of Diabetes Care and Education Specialists: www.diabeteseducator.org Summary  It is important to have healthy eating   habits because your blood sugar (glucose) levels are greatly affected by what you eat and drink.  A healthy meal plan will help you control your blood glucose and maintain a healthy lifestyle.  Your health care provider may recommend that you work with a dietitian to make a meal plan that is best for you.  Keep in mind that carbohydrates (carbs) and alcohol have immediate effects on your blood glucose levels. It is important to count carbs and to use alcohol carefully. This information is not intended to replace advice given to you by your health care provider. Make sure you discuss any questions you have with your health care provider. Document Revised: 05/26/2019 Document Reviewed: 05/26/2019 Elsevier Patient Education  2021 [employer]  

## 2020-09-19 NOTE — Progress Notes (Signed)
[place], [place]  [address] Phone:  [phone]   Fax:  [phone]   Established Patient Office Visit  Subjective:  Patient ID: William [name], male    DOB: [birth year]  Age: 70 y.o. MRN: [id number]  CC:  Chief Complaint  Patient presents with  . Diabetes    HPI William [name] presents for follow up. He is intoday with his daughter who is very hostile. They arrived at the appointment one hour before the scheduled time.   He  has a past medical history of AKI (acute kidney injury) ([place]), Chronic systolic CHF (congestive heart failure) ([place]), Diabetes mellitus without complication ([place]), adenomatous colonic polyps (04/07/2018), Hyperlipidemia, Hypertension, Paroxysmal atrial fibrillation ([place]), and STEMI (ST elevation myocardial infarction) ([place]) (2017).   He has diabetes and has been out of his Lantus. She states that this is due to refusal to refill the medication. I told her that I was not at aware at anytime that he was out of his medication. I did inform her that he has to come be seen at least every 6 months. In order to have his blood work completed. She states that his blood sugar has been hight. His Hemoglobin A1c was 7.3% today. This is down from 7.4% 10 months ago.  He denies headache, dizziness, visual changes, shortness of breath, dyspnea on exertion, chest pain, nausea, vomiting or any edema.    Past Medical History:  Diagnosis Date  . AKI (acute kidney injury) ([place])    With STEMI in 2017  . Chronic systolic CHF (congestive heart failure) ([place])   . Diabetes mellitus without complication ([place])   . Hx of adenomatous colonic polyps 04/07/2018  . Hyperlipidemia   . Hypertension   . Paroxysmal atrial fibrillation ([name])   . STEMI (ST elevation myocardial infarction) ([place]) 2017    Past Surgical History:  Procedure Laterality Date  . ABDOMINAL AORTOGRAM W/LOWER EXTREMITY N/A 09/19/2016   Procedure: Abdominal Aortogram w/Lower  Extremity;  Surgeon: Wellington Hampshire, MD;  Location: [facility];  Service: Cardiovascular;  Laterality: N/A;  . AMPUTATION Right 03/23/2016   Procedure: 1st and 2nd Ray Amputation Right Foot;  Surgeon: Newt Minion, MD;  Location: [facility];  Service: Orthopedics;  Laterality: Right;  . AMPUTATION Right 06/21/2016   Procedure: RIGHT TRANSMETATARSAL AMPUTATION;  Surgeon: Newt Minion, MD;  Location: [facility];  Service: Orthopedics;  Laterality: Right;  . CARDIAC CATHETERIZATION N/A 03/13/2016   Procedure: Right/Left Heart Cath and Coronary Angiography;  Surgeon: Sherren Mocha, MD;  Location: [facility];  Service: Cardiovascular;  Laterality: N/A;  . CARDIAC CATHETERIZATION N/A 03/13/2016   Procedure: IABP Insertion;  Surgeon: Sherren Mocha, MD;  Location: [facility];  Service: Cardiovascular;  Laterality: N/A;  . CATARACT EXTRACTION, BILATERAL    . [place]   has had 3 neck surgeries from breaking his neck  . CORONARY ARTERY BYPASS GRAFT N/A 03/13/2016   Procedure: CORONARY ARTERY BYPASS GRAFTING (CABG) x 1 (SVG to OM) with EVH from [place];  Surgeon: Ivin Poot, MD;  Location: [facility];  Service: Open Heart Surgery;  Laterality: N/A;  . LOWER EXTREMITY ANGIOGRAM  05/02/2016   Procedure: Lower Extremity Angiogram;  Surgeon: Wellington Hampshire, MD;  Location: [facility];  Service: Cardiovascular;;  Limited left femoral runoff right femoral runoff  . PERIPHERAL VASCULAR CATHETERIZATION N/A 05/02/2016   Procedure: Abdominal Aortogram;  Surgeon: Wellington Hampshire, MD;  Location: [facility];  Service: Cardiovascular;  Laterality: N/A;  . PERIPHERAL VASCULAR CATHETERIZATION Right 05/02/2016   Procedure: Peripheral Vascular Balloon Angioplasty;  Surgeon: Wellington Hampshire, MD;  Location: [facility];  Service: Cardiovascular;  Laterality: Right;  SFA  . TEE WITHOUT CARDIOVERSION N/A 03/13/2016   Procedure: TRANSESOPHAGEAL  ECHOCARDIOGRAM (TEE);  Surgeon: Ivin Poot, MD;  Location: [facility];  Service: Open Heart Surgery;  Laterality: N/A;  . VSD REPAIR N/A 03/13/2016   Procedure: VENTRICULAR SEPTAL DEFECT (VSD) REPAIR;  Surgeon: Ivin Poot, MD;  Location: [facility];  Service: Open Heart Surgery;  Laterality: N/A;    Family History  Problem Relation Age of Onset  . Diabetes Maternal Grandmother   . Diabetes Mother   . Aneurysm Mother   . Peripheral Artery Disease Mother   . Coronary artery disease Mother   . Peptic Ulcer Father   . Retinoblastoma Daughter   . Colon cancer Neg Hx   . Rectal cancer Neg Hx     Social History   Socioeconomic History  . Marital status: Married    Spouse name: Lavell Luster  . Number of children: Not on file  . Years of education: Not on file  . Highest education level: Not on file  Occupational History  . Not on file  Tobacco Use  . Smoking status: Former Smoker    Packs/day: 0.50    Types: Cigarettes    Quit date: 11/20/2015    Years since quitting: 4.8  . Smokeless tobacco: Never Used  . Tobacco comment: quit 2018  Vaping Use  . Vaping Use: Never used  Substance and Sexual Activity  . Alcohol use: No  . Drug use: No  . Sexual activity: Not on file  Other Topics Concern  . 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  He is disabled from a neck fracture in 1994.  1 son and 2 daughters.      He is illiterate.      No alcohol or caffeine or drug use or other tobacco he is a former smoker.   Social Determinants of Health   Financial Resource Strain: Not on file  Food Insecurity: Not on file  Transportation Needs: Not on file  Physical Activity: Not on file  Stress: Not on file  Social Connections: Not on file  Intimate Partner Violence: Not on file    Outpatient Medications Prior to Visit  Medication Sig Dispense Refill  . acetaminophen  (TYLENOL) 650 MG CR tablet Take 650 mg by mouth every 8 (eight) hours as needed for pain.    [name] albuterol (PROVENTIL HFA;VENTOLIN HFA) 108 (90 Base) MCG/ACT inhaler Inhale 2 puffs into the lungs every 6 (six) hours as needed for wheezing or shortness of breath. 1 Inhaler 0  . aspirin EC 81 MG tablet Take 1 tablet (81 mg total) by mouth daily. 30 tablet 3  . Blood Glucose Monitoring Suppl (ACCU-CHEK AVIVA PLUS) w/Device KIT 1 each by Does not apply route 4 (four) times daily -  before meals and at bedtime. 1 kit 0  . carvedilol (COREG) 3.125 MG tablet TAKE 1 TABLET(3.125 MG) BY MOUTH TWICE DAILY WITH A MEAL 180 tablet 2  . gabapentin (NEURONTIN) 300 MG capsule Take 1 capsule (300 mg total) by mouth 2 (two) times daily. 180 capsule 3  .  glucose blood (ACCU-CHEK AVIVA PLUS) test strip TEST 3 TIMES DAILY PRIOR TO MEALS AND AT BEDTIME 100 strip 11  . Lancets (ACCU-CHEK SOFT TOUCH) lancets Use as instructed 100 each 12  . pantoprazole (PROTONIX) 40 MG tablet TAKE 1 TABLET BY MOUTH TWICE DAILY BEFORE A MEAL AND 30 MINUTES BEFORE BREAKFAST AND SUPPER 180 tablet 1  . pramipexole (MIRAPEX) 0.125 MG tablet TAKE 1 TABLET(0.125 MG) BY MOUTH AT BEDTIME 90 tablet 0  . rosuvastatin (CRESTOR) 40 MG tablet TAKE 1 TABLET BY MOUTH EVERY DAY 90 tablet 3  . silver sulfADIAZINE (SILVADENE) 1 % cream Apply 1 application topically daily. Apply to affected area daily plus dry dressing 400 g 3  . torsemide (DEMADEX) 20 MG tablet Take 1 tablet (20 mg total) by mouth 3 (three) times a week. 15 tablet 10  . warfarin (COUMADIN) 5 MG tablet TAKE 1 TO 1 AND 1/2 TABLET BY MOUTH DAILY AS DIRECTED BY COUMADIN CLINIC 180 tablet 1  . insulin glargine (LANTUS SOLOSTAR) 100 UNIT/ML Solostar Pen 55 units daily 18 mL 0  . insulin lispro (HUMALOG KWIKPEN) 100 UNIT/ML KwikPen ADMINISTER UP TO 10 UNITS UNDER THE SKIN THREE TIMES DAILY AS DIRECTED PER SLIDING SCALE 27 mL 3  . Insulin Pen Needle 31G X 8 MM MISC 1 each by Does not apply route 4  (four) times daily. 100 each 12   No facility-administered medications prior to visit.    Allergies  Allergen Reactions  . Morphine And Related Shortness Of Breath and Other (See Comments)    UNSPECIFIED REACTION "Pt said it was too much"   . Latex Rash    ROS Review of Systems    Objective:    Physical Exam HENT:     Head: Normocephalic and atraumatic.     Ears:     Comments: Hearing aide  Cardiovascular:     Rate and Rhythm: Normal rate and regular rhythm.     Pulses: Normal pulses.     Heart sounds: Normal heart sounds.  Pulmonary:     Effort: Pulmonary effort is normal.  Abdominal:     Palpations: Abdomen is soft.  Musculoskeletal:     Cervical back: Normal range of motion.     Right lower leg: Edema (trace) present.     Left lower leg: Edema (trace) present.  Skin:    General: Skin is warm.     Comments: Scabs to BLE  Neurological:     Mental Status: He is alert.     BP (!) 152/63   Pulse 86   Ht _0  (1.651 m)   Wt 186 lb (84.4 kg)   SpO2 99%   BMI 30.95 kg/m  Wt Readings from Last 3 Encounters:  09/19/20 186 lb (84.4 kg)  03/08/20 188 lb 9.6 oz (85.5 kg)  10/29/19 187 lb 3.2 oz (84.9 kg)     Health Maintenance Due  Topic Date Due  . COVID-19 Vaccine (1) Never done  . FOOT EXAM  01/05/2020  . URINE MICROALBUMIN  01/05/2020   . HEMOGLOBIN A1C  04/29/2020    There are no preventive care reminders to display for this patient.  Lab Results  Component Value Date   TSH 1.885 07/10/2011   Lab Results  Component Value Date   WBC 6.4 09/01/2019   HGB 12.6 (L) 09/01/2019   HCT 36.1 (L) 09/01/2019   MCV 86 09/01/2019   PLT 147 (L) 09/01/2019   Lab Results  Component Value Date   NA  141 09/01/2019   K 4.5 09/01/2019   CO2 25 09/01/2019   GLUCOSE 106 (H) 09/01/2019   BUN 35 (H) 09/01/2019   CREATININE 1.80 (H) 09/01/2019   BILITOT 0.4 01/05/2019   ALKPHOS 104 01/05/2019   AST 22 01/05/2019   ALT 24 01/05/2019   PROT 6.4  01/05/2019   ALBUMIN 4.3 01/05/2019   CALCIUM 9.8 09/01/2019   ANIONGAP 10 03/27/2017   Lab Results  Component Value Date   CHOL 90 (L) 01/05/2019   Lab Results  Component Value Date   HDL 34 (L) 01/05/2019   Lab Results  Component Value Date   LDLCALC 24 01/05/2019   Lab Results  Component Value Date   TRIG 159 (H) 01/05/2019   Lab Results  Component Value Date   CHOLHDL 2.6 01/05/2019   Lab Results  Component Value Date   HGBA1C 7.3 (A) 09/19/2020      Assessment & Plan:   Problem List Items Addressed This Visit      Cardiovascular and Mediastinum   Type 2 diabetes mellitus with diabetic peripheral angiopathy and gangrene, with long-term current use of insulin ([name]) Controlled Continue with current regimen; refill provided.       Meds ordered this encounter  Medications  . insulin glargine (LANTUS SOLOSTAR) 100 UNIT/ML Solostar Pen    Sig: 55 units daily    Dispense:  18 mL    Refill:  0  . insulin lispro (HUMALOG KWIKPEN) 100 UNIT/ML KwikPen    Sig: ADMINISTER UP TO 10 UNITS UNDER THE SKIN THREE TIMES DAILY AS DIRECTED PER SLIDING SCALE    Dispense:  27 mL    Refill:  3    **Patient requests 90 days supply**  . Insulin Pen Needle 31G X 8 MM MISC    Sig: 1 each by Does not apply route 4 (four) times daily.    Dispense:  100 each    Refill:  12    Follow-up: No follow-ups on file. No follow up. Daughter is seeking new clinic.    Vevelyn Francois, NP

## 2020-09-29 ENCOUNTER — Other Ambulatory Visit: Payer: Self-pay

## 2020-09-29 ENCOUNTER — Other Ambulatory Visit (HOSPITAL_COMMUNITY): Payer: Self-pay | Admitting: Cardiovascular Disease

## 2020-09-29 ENCOUNTER — Ambulatory Visit (HOSPITAL_COMMUNITY)
Admission: RE | Admit: 2020-09-29 | Discharge: 2020-09-29 | Disposition: A | Discharge status: Home / Self Care | Payer: Medicare Other | Source: Ambulatory Visit | Attending: Cardiology | Admitting: Cardiology

## 2020-09-29 DIAGNOSIS — I739 Peripheral vascular disease, unspecified: Secondary | ICD-10-CM | POA: Diagnosis not present

## 2020-10-01 DIAGNOSIS — R4020 Unspecified coma: Secondary | ICD-10-CM | POA: Diagnosis not present

## 2020-10-01 DIAGNOSIS — E161 Other hypoglycemia: Secondary | ICD-10-CM | POA: Diagnosis not present

## 2020-10-01 DIAGNOSIS — Z743 Need for continuous supervision: Secondary | ICD-10-CM | POA: Diagnosis not present

## 2020-10-01 DIAGNOSIS — E162 Hypoglycemia, unspecified: Secondary | ICD-10-CM | POA: Diagnosis not present

## 2020-10-01 DIAGNOSIS — R404 Transient alteration of awareness: Secondary | ICD-10-CM | POA: Diagnosis not present

## 2020-10-03 DIAGNOSIS — Z961 Presence of intraocular lens: Secondary | ICD-10-CM | POA: Diagnosis not present

## 2020-10-03 DIAGNOSIS — H35033 Hypertensive retinopathy, bilateral: Secondary | ICD-10-CM | POA: Diagnosis not present

## 2020-10-03 DIAGNOSIS — E113393 Type 2 diabetes mellitus with moderate nonproliferative diabetic retinopathy without macular edema, bilateral: Secondary | ICD-10-CM | POA: Diagnosis not present

## 2020-10-03 DIAGNOSIS — H26492 Other secondary cataract, left eye: Secondary | ICD-10-CM | POA: Diagnosis not present

## 2020-10-04 ENCOUNTER — Other Ambulatory Visit: Payer: Self-pay | Admitting: Nurse Practitioner

## 2020-10-04 ENCOUNTER — Other Ambulatory Visit: Payer: Self-pay | Admitting: Cardiology

## 2020-10-04 DIAGNOSIS — I5022 Chronic systolic (congestive) heart failure: Secondary | ICD-10-CM

## 2020-10-04 DIAGNOSIS — E1152 Type 2 diabetes mellitus with diabetic peripheral angiopathy with gangrene: Secondary | ICD-10-CM

## 2020-10-13 LAB — HM DIABETES EYE EXAM

## 2020-10-17 ENCOUNTER — Other Ambulatory Visit: Payer: Self-pay

## 2020-10-17 ENCOUNTER — Ambulatory Visit (INDEPENDENT_AMBULATORY_CARE_PROVIDER_SITE_OTHER): Payer: Medicare Other

## 2020-10-17 DIAGNOSIS — I480 Paroxysmal atrial fibrillation: Secondary | ICD-10-CM | POA: Diagnosis not present

## 2020-10-17 DIAGNOSIS — Z5181 Encounter for therapeutic drug level monitoring: Secondary | ICD-10-CM | POA: Diagnosis not present

## 2020-10-17 LAB — POCT INR: INR: 2.2 (ref 2.0–3.0)

## 2020-10-17 NOTE — Patient Instructions (Signed)
Continue taking 1.5 tablets daily except for 1 tablet every Monday, Wednesday and Friday.  Recheck INR in 5 weeks

## 2020-10-25 ENCOUNTER — Ambulatory Visit: Payer: Medicare Other | Admitting: Cardiovascular Disease

## 2020-10-25 NOTE — Progress Notes (Deleted)
Cardiology Office Note   Date:  10/25/2020   ID:  William [name], DOB 24-Jul-1950, MRN [id number]  PCP:  Vevelyn Francois, NP  Cardiologist:   [name], MD   No chief complaint on file.     History of Present Illness: William [name] is a 70 y.o. male who Is here today for a follow-up visit regarding peripheral arterial disease, coronary artery disease and paroxysmal atrial fibrillation. He has known history of inferior myocardial infarction complicated by VSD in September, 2017. He is status post one-vessel CABG and VSD repair. He had postoperative atrial fibrillation and has been on anticoagulation. He had amputation of the right great toe while hospitalized for gangrene. The patient has known history of diabetes.  He is known to have peripheral arterial disease. Angiography in November, 2017 showed no significant aortoiliac disease, significant right distal SFA stenosis and one-vessel runoff below the knee via the peroneal artery with reconstitution of the dorsalis pedis distally. I performed successful drug-coated balloon angioplasty of the right SFA. He is s/p right transmetatarsal amputation for osteomyelitis.    He had angiography to the left leg in March 2018 which showed moderate stenosis affecting the left popliteal artery and TP trunk with 1 vessel runoff below the knee via the peroneal artery which was a large dominant vessel and reconstituted the dorsalis pedis and posterior tibial at the level of the ankle. There was brisk flow to the toes. Thus, no revascularization was performed.  He has been doing well with no recent chest pain, shortness of breath or palpitations.  No significant leg claudication or ulceration.   Past Medical History:  Diagnosis Date  . AKI (acute kidney injury) ([facility] [place])    With STEMI in 2017  . Chronic systolic CHF (congestive heart failure) ([facility])   . Diabetes mellitus without complication ([facility])   . Hx of adenomatous colonic polyps 04/07/2018  .  Hyperlipidemia   . Hypertension   . Paroxysmal atrial fibrillation ([name])   . STEMI (ST elevation myocardial infarction) ([facility]) 2017    Past Surgical History:  Procedure Laterality Date  . ABDOMINAL AORTOGRAM W/LOWER EXTREMITY N/A 09/19/2016   Procedure: Abdominal Aortogram w/Lower Extremity;  Surgeon: Wellington Hampshire, MD;  Location: [facility];  Service: Cardiovascular;  Laterality: N/A;  . AMPUTATION Right 03/23/2016   Procedure: 1st and 2nd Ray Amputation Right Foot;  Surgeon: Newt Minion, MD;  Location: [facility];  Service: Orthopedics;  Laterality: Right;  . AMPUTATION Right 06/21/2016   Procedure: RIGHT TRANSMETATARSAL AMPUTATION;  Surgeon: Newt Minion, MD;  Location: [facility];  Service: Orthopedics;  Laterality: Right;  . CARDIAC CATHETERIZATION N/A 03/13/2016   Procedure: Right/Left Heart Cath and Coronary Angiography;  Surgeon: Sherren Mocha, MD;  Location: [facility];  Service: Cardiovascular;  Laterality: N/A;  . CARDIAC CATHETERIZATION N/A 03/13/2016   Procedure: IABP Insertion;  Surgeon: Sherren Mocha, MD;  Location: [facility];  Service: Cardiovascular;  Laterality: N/A;  . CATARACT EXTRACTION, BILATERAL    . [name]   has had 3 neck surgeries from breaking his neck  . CORONARY ARTERY BYPASS GRAFT N/A 03/13/2016   Procedure: CORONARY ARTERY BYPASS GRAFTING (CABG) x 1 (SVG to OM) with EVH from [facility];  Surgeon: Ivin Poot, MD;  Location: [facility];  Service: Open Heart Surgery;  Laterality: N/A;  . LOWER EXTREMITY ANGIOGRAM  05/02/2016   Procedure: Lower Extremity Angiogram;  Surgeon: Wellington Hampshire, MD;  Location: [name];  Service: Cardiovascular;;  Limited left femoral runoff right femoral runoff  . PERIPHERAL VASCULAR CATHETERIZATION N/A 05/02/2016   Procedure: Abdominal Aortogram;  Surgeon: Wellington Hampshire, MD;  Location: [facility];  Service: Cardiovascular;  Laterality: N/A;  . PERIPHERAL  VASCULAR CATHETERIZATION Right 05/02/2016   Procedure: Peripheral Vascular Balloon Angioplasty;  Surgeon: Wellington Hampshire, MD;  Location: [facility];  Service: Cardiovascular;  Laterality: Right;  SFA  . TEE WITHOUT CARDIOVERSION N/A 03/13/2016   Procedure: TRANSESOPHAGEAL ECHOCARDIOGRAM (TEE);  Surgeon: Ivin Poot, MD;  Location: [facility];  Service: Open Heart Surgery;  Laterality: N/A;  . VSD REPAIR N/A 03/13/2016   Procedure: VENTRICULAR SEPTAL DEFECT (VSD) REPAIR;  Surgeon: Ivin Poot, MD;  Location: [facility];  Service: Open Heart Surgery;  Laterality: N/A;     Current Outpatient Medications  Medication Sig Dispense Refill  . acetaminophen (TYLENOL) 650 MG CR tablet Take 650 mg by mouth every 8 (eight) hours as needed for pain.    [name] albuterol (PROVENTIL HFA;VENTOLIN HFA) 108 (90 Base) MCG/ACT inhaler Inhale 2 puffs into the lungs every 6 (six) hours as needed for wheezing or shortness of breath. 1 Inhaler 0  . aspirin EC 81 MG tablet Take 1 tablet (81 mg total) by mouth daily. 30 tablet 3  . Blood Glucose Monitoring Suppl (ACCU-CHEK AVIVA PLUS) w/Device KIT 1 each by Does not apply route 4 (four) times daily -  before meals and at bedtime. 1 kit 0  . carvedilol (COREG) 3.125 MG tablet TAKE 1 TABLET(3.125 MG) BY MOUTH TWICE DAILY WITH A MEAL 180 tablet 2  . gabapentin (NEURONTIN) 300 MG capsule Take 1 capsule (300 mg total) by mouth 2 (two) times daily. 180 capsule 3  . glucose blood (ACCU-CHEK AVIVA PLUS) test strip TEST 3 TIMES DAILY PRIOR TO MEALS AND AT BEDTIME 100 strip 11  . insulin glargine (LANTUS SOLOSTAR) 100 UNIT/ML Solostar Pen 55 units daily 18 mL 0  . insulin lispro (HUMALOG KWIKPEN) 100 UNIT/ML KwikPen ADMINISTER UP TO 10 UNITS UNDER THE SKIN THREE TIMES DAILY AS DIRECTED PER SLIDING SCALE 27 mL 3  . Insulin Pen Needle 31G X 8 MM MISC 1 each by Does not apply route 4 (four) times daily. 100 each 12  . Lancets (ACCU-CHEK SOFT TOUCH) lancets Use as instructed 100 each  12  . pantoprazole (PROTONIX) 40 MG tablet TAKE 1 TABLET BY MOUTH TWICE DAILY BEFORE A MEAL AND 30 MINUTES BEFORE BREAKFAST AND SUPPER 180 tablet 1  . pramipexole (MIRAPEX) 0.125 MG tablet TAKE 1 TABLET(0.125 MG) BY MOUTH AT BEDTIME 90 tablet 0  . rosuvastatin (CRESTOR) 40 MG tablet TAKE 1 TABLET BY MOUTH EVERY DAY 90 tablet 3  . silver sulfADIAZINE (SILVADENE) 1 % cream Apply 1 application topically daily. Apply to affected area daily plus dry dressing 400 g 3  . torsemide (DEMADEX) 20 MG tablet TAKE 1 TABLET(20 MG) BY MOUTH 3 TIMES A WEEK 15 tablet 10  . warfarin (COUMADIN) 5 MG tablet TAKE 1 TO 1 AND 1/2 TABLET BY MOUTH DAILY AS DIRECTED BY COUMADIN CLINIC 180 tablet 1   No current facility-administered medications for this visit.    Allergies:   Morphine and related and Latex    Social History:  The patient  reports that he quit smoking about 4 years ago. His smoking use included cigarettes. He smoked 0.50 packs per day. He has never used smokeless tobacco. He reports that he does  not drink alcohol and does not use drugs.   Family History:  Not able to obtain due to distress.  ROS:  Please see the history of present illness.   Otherwise, review of systems are positive for none.   All other systems are reviewed and negative.    PHYSICAL EXAM: VS:  There were no vitals taken for this visit. , BMI There is no height or weight on file to calculate BMI. GEN: Well nourished, well developed, in no acute distress  HEENT: normal  Neck: no JVD, carotid bruits, or masses Cardiac: RRR; no  rubs, or gallops,no edema . No murmurs Respiratory:  clear to auscultation bilaterally, normal work of breathing GI: soft, nontender, nondistended, + BS MS: no deformity or atrophy  Skin: warm and dry, no rash Neuro:  Strength and sensation are intact Psych: euthymic mood, full affect Vascular: Distal pulses are not palpable.  No ulceration.  EKG:  EKG is not ordered today.   Recent Labs: No results  found for requested labs within last 8760 hours.    Lipid Panel    Component Value Date/Time   CHOL 90 (L) 01/05/2019 1123   TRIG 159 (H) 01/05/2019 1123   HDL 34 (L) 01/05/2019 1123   CHOLHDL 2.6 01/05/2019 1123   CHOLHDL 3.0 03/20/2017 1144   VLDL 29 03/13/2016 1057   LDLCALC 24 01/05/2019 1123   LDLCALC 45 03/20/2017 1144      Wt Readings from Last 3 Encounters:  09/19/20 186 lb (84.4 kg)  03/08/20 188 lb 9.6 oz (85.5 kg)  10/29/19 187 lb 3.2 oz (84.9 kg)      No flowsheet data found.     ASSESSMENT AND PLAN:  1.  Peripheral arterial disease :  He is status post drug-coated balloon angioplasty of the right SFA in 2017. He has significant tibial disease bilaterally. Currently he has no ulceration or claudication. Continue medical therapy. Noninvasive vascular studies last year showed normal ABI on the right side with patent right SFA.  2. Chronic systolic heart failure: He appears to be euvolemic.   Continue small dose carvedilol.  He is no longer on losartan due to low blood pressure and chronic kidney disease.  Most recent echo in 2018 showed an EF of 45 to 50%.  3. Paroxysmal atrial fibrillation: He is maintaining in sinus rhythm.  He is on long-term anticoagulation with warfarin with no bleeding complications.    4. Coronary artery disease involving native coronary arteries without angina:  status post one-vessel CABG and VSD repair. Continue medical therapy.  5. Diabetes mellitus: Improved from before.  6. Hyperlipidemia: Continue high-dose rosuvastatin.  Most recent lipid profile showed an LDL of 24.  7.  The patient has not received COVID-19 vaccination.  I discussed with him risk and benefits but he and his daughter are completely opposed to this vaccine and they would not consider it until it has been approved for at least few years.   Disposition:   follow-up with me in 6 months  Signed,  [name], MD  10/25/2020 10:04 AM    [place]

## 2020-11-10 ENCOUNTER — Other Ambulatory Visit: Payer: Self-pay | Admitting: Nurse Practitioner

## 2020-11-10 DIAGNOSIS — E1152 Type 2 diabetes mellitus with diabetic peripheral angiopathy with gangrene: Secondary | ICD-10-CM

## 2020-11-21 ENCOUNTER — Ambulatory Visit (INDEPENDENT_AMBULATORY_CARE_PROVIDER_SITE_OTHER): Payer: Medicare Other

## 2020-11-21 ENCOUNTER — Other Ambulatory Visit: Payer: Self-pay

## 2020-11-21 DIAGNOSIS — E875 Hyperkalemia: Secondary | ICD-10-CM | POA: Diagnosis not present

## 2020-11-21 DIAGNOSIS — I480 Paroxysmal atrial fibrillation: Secondary | ICD-10-CM | POA: Diagnosis not present

## 2020-11-21 DIAGNOSIS — Z5181 Encounter for therapeutic drug level monitoring: Secondary | ICD-10-CM

## 2020-11-21 DIAGNOSIS — N189 Chronic kidney disease, unspecified: Secondary | ICD-10-CM | POA: Diagnosis not present

## 2020-11-21 DIAGNOSIS — N1832 Chronic kidney disease, stage 3b: Secondary | ICD-10-CM | POA: Diagnosis not present

## 2020-11-21 DIAGNOSIS — I129 Hypertensive chronic kidney disease with stage 1 through stage 4 chronic kidney disease, or unspecified chronic kidney disease: Secondary | ICD-10-CM | POA: Diagnosis not present

## 2020-11-21 DIAGNOSIS — D631 Anemia in chronic kidney disease: Secondary | ICD-10-CM | POA: Diagnosis not present

## 2020-11-21 LAB — POCT INR: INR: 2.3 (ref 2.0–3.0)

## 2020-11-21 NOTE — Patient Instructions (Signed)
Continue taking 1.5 tablets daily except for 1 tablet every Monday, Wednesday and Friday.  Recheck INR in 5 weeks    

## 2020-11-22 ENCOUNTER — Encounter: Payer: Self-pay | Admitting: Internal Medicine

## 2020-11-22 ENCOUNTER — Ambulatory Visit (INDEPENDENT_AMBULATORY_CARE_PROVIDER_SITE_OTHER): Payer: Medicare Other | Admitting: Internal Medicine

## 2020-11-22 VITALS — BP 134/60 | HR 60 | Temp 98.2°F | Resp 16 | Ht 65.0 in | Wt 181.0 lb

## 2020-11-22 DIAGNOSIS — D539 Nutritional anemia, unspecified: Secondary | ICD-10-CM | POA: Diagnosis not present

## 2020-11-22 DIAGNOSIS — Z0001 Encounter for general adult medical examination with abnormal findings: Secondary | ICD-10-CM

## 2020-11-22 DIAGNOSIS — E119 Type 2 diabetes mellitus without complications: Secondary | ICD-10-CM

## 2020-11-22 DIAGNOSIS — N1832 Chronic kidney disease, stage 3b: Secondary | ICD-10-CM | POA: Diagnosis not present

## 2020-11-22 DIAGNOSIS — I2510 Atherosclerotic heart disease of native coronary artery without angina pectoris: Secondary | ICD-10-CM | POA: Diagnosis not present

## 2020-11-22 DIAGNOSIS — I10 Essential (primary) hypertension: Secondary | ICD-10-CM | POA: Diagnosis not present

## 2020-11-22 DIAGNOSIS — E1122 Type 2 diabetes mellitus with diabetic chronic kidney disease: Secondary | ICD-10-CM | POA: Diagnosis not present

## 2020-11-22 DIAGNOSIS — E118 Type 2 diabetes mellitus with unspecified complications: Secondary | ICD-10-CM

## 2020-11-22 DIAGNOSIS — Z125 Encounter for screening for malignant neoplasm of prostate: Secondary | ICD-10-CM | POA: Diagnosis not present

## 2020-11-22 DIAGNOSIS — I5022 Chronic systolic (congestive) heart failure: Secondary | ICD-10-CM

## 2020-11-22 DIAGNOSIS — I2583 Coronary atherosclerosis due to lipid rich plaque: Secondary | ICD-10-CM

## 2020-11-22 DIAGNOSIS — Z0000 Encounter for general adult medical examination without abnormal findings: Secondary | ICD-10-CM | POA: Diagnosis not present

## 2020-11-22 DIAGNOSIS — D696 Thrombocytopenia, unspecified: Secondary | ICD-10-CM

## 2020-11-22 DIAGNOSIS — Z794 Long term (current) use of insulin: Secondary | ICD-10-CM | POA: Diagnosis not present

## 2020-11-22 DIAGNOSIS — I480 Paroxysmal atrial fibrillation: Secondary | ICD-10-CM | POA: Diagnosis not present

## 2020-11-22 DIAGNOSIS — E785 Hyperlipidemia, unspecified: Secondary | ICD-10-CM | POA: Insufficient documentation

## 2020-11-22 MED ORDER — GVOKE HYPOPEN 2-PACK 1 MG/0.2ML ~~LOC~~ SOAJ: 1.0000 | Freq: Every day | SUBCUTANEOUS | 5 refills | Status: DC | PRN

## 2020-11-22 MED ORDER — FREESTYLE LIBRE 2 SENSOR MISC: 1.0000 | Freq: Every day | 5 refills | Status: DC

## 2020-11-22 MED ORDER — FREESTYLE LIBRE 2 READER DEVI: 1.0000 | Freq: Every day | 5 refills | Status: DC

## 2020-11-22 NOTE — Progress Notes (Signed)
Subjective:  Patient ID: William [name], male    DOB: 1950-10-19  Age: 70 y.o. MRN: [id number]  CC: Annual Exam, Hypertension, Hyperlipidemia, Diabetes, Anemia, Congestive Heart Failure, Coronary Artery Disease, and Atrial Fibrillation  This visit occurred during the SARS-CoV-2 public health emergency.  Safety protocols were in place, including screening questions prior to the visit, additional usage of staff PPE, and extensive cleaning of exam room while observing appropriate contact time as indicated for disinfecting solutions.    HPI William [name] presents for a CPX, f/up, and to establish.  He complains that he has recently had some low blood sugars.  His daughter is with him today and tells me EMS has been called to the house a couple of times.  He denies polys and tells me his blood sugar has otherwise been okay.  He has a history of CAD but he says he has had no recent episodes of chest pain or shortness of breath.  He does not think he has seen a cardiologist in several years.  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  He is followed by vascular surgery.  He has a history of paroxysmal atrial fibrillation.  History William [name] has a past medical history of AKI (acute kidney injury) (William [name]), Chronic systolic CHF (congestive heart failure) (William [name]), Diabetes mellitus without complication (William [name]), adenomatous colonic polyps (04/07/2018), Hyperlipidemia, Hypertension, Paroxysmal atrial fibrillation (William [name]), and STEMI (ST elevation myocardial infarction) (William [name]) (2017).   He has a past surgical history that includes Cardiac catheterization (N/A, 03/13/2016); Cardiac catheterization (N/A, 03/13/2016); Coronary artery bypass graft (N/A, 03/13/2016); VSD repair (N/A, 03/13/2016); TEE without cardioversion (N/A, 03/13/2016); Amputation (Right, 03/23/2016); lower extremity angiogram (05/02/2016); Cardiac catheterization (N/A, 05/02/2016); Cardiac catheterization (Right, 05/02/2016); Amputation  (Right, 06/21/2016); ABDOMINAL AORTOGRAM W/LOWER EXTREMITY (N/A, 09/19/2016); Cervical fusion (1982, 1992); and Cataract extraction, bilateral.   His family history includes Aneurysm in his mother; Coronary artery disease in his mother; Diabetes in his maternal grandmother and mother; Peptic Ulcer in his father; Peripheral Artery Disease in his mother; Retinoblastoma in his daughter.He reports that he quit smoking about 5 years ago. His smoking use included cigarettes. He smoked 0.50 packs per day. He has never used smokeless tobacco. He reports that he does not drink alcohol and does not use drugs.  Outpatient Medications Prior to Visit  Medication Sig Dispense Refill  . acetaminophen (TYLENOL) 650 MG CR tablet Take 650 mg by mouth every 8 (eight) hours as needed for pain.    [name] albuterol (PROVENTIL HFA;VENTOLIN HFA) 108 (90 Base) MCG/ACT inhaler Inhale 2 puffs into the lungs every 6 (six) hours as needed for wheezing or shortness of breath. 1 Inhaler 0  . aspirin EC 81 MG tablet Take 1 tablet (81 mg total) by mouth daily. 30 tablet 3  . Blood Glucose Monitoring Suppl (ACCU-CHEK AVIVA PLUS) w/Device KIT 1 each by Does not apply route 4 (four) times daily -  before meals and at bedtime. 1 kit 0  . carvedilol (COREG) 3.125 MG tablet TAKE 1 TABLET(3.125 MG) BY MOUTH TWICE DAILY WITH A MEAL 180 tablet 2  . gabapentin (NEURONTIN) 300 MG capsule Take 1 capsule (300 mg total) by mouth 2 (two) times daily. 180 capsule 3  . glucose blood (ACCU-CHEK AVIVA PLUS) test strip TEST 3 TIMES DAILY PRIOR TO MEALS AND AT BEDTIME 100 strip 11  . insulin glargine (LANTUS SOLOSTAR) 100 UNIT/ML Solostar Pen ADMINISTER 55 UNITS UNDER THE SKIN DAILY 18 mL 0  . insulin lispro (  HUMALOG KWIKPEN) 100 UNIT/ML KwikPen ADMINISTER UP TO 10 UNITS UNDER THE SKIN THREE TIMES DAILY AS DIRECTED PER SLIDING SCALE 27 mL 3  . Insulin Pen Needle 31G X 8 MM MISC 1 each by Does not apply route 4 (four) times daily. 100 each 12  . Lancets  (ACCU-CHEK SOFT TOUCH) lancets Use as instructed 100 each 12  . pantoprazole (PROTONIX) 40 MG tablet TAKE 1 TABLET BY MOUTH TWICE DAILY BEFORE A MEAL AND 30 MINUTES BEFORE BREAKFAST AND SUPPER 180 tablet 1  . pramipexole (MIRAPEX) 0.125 MG tablet TAKE 1 TABLET(0.125 MG) BY MOUTH AT BEDTIME 90 tablet 0  . rosuvastatin (CRESTOR) 40 MG tablet TAKE 1 TABLET BY MOUTH EVERY DAY 90 tablet 3  . torsemide (DEMADEX) 20 MG tablet TAKE 1 TABLET(20 MG) BY MOUTH 3 TIMES A WEEK 15 tablet 10  . warfarin (COUMADIN) 5 MG tablet TAKE 1 TO 1 AND 1/2 TABLET BY MOUTH DAILY AS DIRECTED BY COUMADIN CLINIC 180 tablet 1  . silver sulfADIAZINE (SILVADENE) 1 % cream Apply 1 application topically daily. Apply to affected area daily plus dry dressing 400 g 3   No facility-administered medications prior to visit.    ROS Review of Systems  Constitutional: Negative.  Negative for appetite change, chills, diaphoresis, fatigue and fever.  HENT: Negative.   Eyes: Negative.   Respiratory: Negative for cough, chest tightness, shortness of breath and wheezing.   Cardiovascular: Negative for chest pain, palpitations and leg swelling.  Gastrointestinal: Negative for abdominal pain, constipation, diarrhea, nausea and vomiting.  Endocrine: Negative.  Negative for cold intolerance, heat intolerance, polydipsia, polyphagia and polyuria.  Genitourinary: Negative.  Negative for difficulty urinating, dysuria, hematuria, scrotal swelling and testicular pain.  Musculoskeletal: Negative for arthralgias and myalgias.  Skin: Negative.   Neurological: Negative.  Negative for dizziness, weakness, light-headedness, numbness and headaches.  Hematological: Negative for adenopathy. Does not bruise/bleed easily.  Psychiatric/Behavioral: Negative for decreased concentration and sleep disturbance. The patient is not nervous/anxious.     Objective:  BP 134/60 (BP Location: Right Arm, Patient Position: Sitting, Cuff Size: Large)   Pulse 60   Temp  98.2 F (36.8 C) (Oral)   Resp 16   Ht '5\' 5"'  (1.651 m)   Wt 181 lb (82.1 kg)   SpO2 99%   BMI 30.12 kg/m   Physical Exam Vitals reviewed.  Constitutional:      Appearance: Normal appearance.  HENT:     Nose: Nose normal.     Mouth/Throat:     Mouth: Mucous membranes are moist.  Eyes:     General: No scleral icterus.    Conjunctiva/sclera: Conjunctivae normal.  Cardiovascular:     Rate and Rhythm: Regular rhythm. Bradycardia present.     Heart sounds: Normal heart sounds, S1 normal and S2 normal. No murmur heard. No gallop.      Comments: EKG - Sinus bradycardia, 55 bpm Inferior Q waves are not new There are new Q waves and loss of voltage in the antero/lateral leads Pulmonary:     Effort: Pulmonary effort is normal.     Breath sounds: No stridor. No wheezing, rhonchi or rales.  Abdominal:     General: Abdomen is flat.     Palpations: There is no mass.     Tenderness: There is no abdominal tenderness. There is no guarding or rebound.     Hernia: No hernia is present.  Musculoskeletal:        General: Normal range of motion.     Cervical  back: Neck supple.     Right lower leg: No edema.     Left lower leg: No edema.  Lymphadenopathy:     Cervical: No cervical adenopathy.  Skin:    General: Skin is warm and dry.  Neurological:     General: No focal deficit present.     Mental Status: He is alert.  Psychiatric:        Mood and Affect: Mood normal.        Behavior: Behavior normal.     Lab Results  Component Value Date   WBC 6.7 11/22/2020   HGB 12.6 (L) 11/22/2020   HCT 36.5 (L) 11/22/2020   PLT 170.0 11/22/2020   GLUCOSE 266 (H) 11/22/2020   CHOL 88 11/22/2020   TRIG 173.0 (H) 11/22/2020   HDL 27.60 (L) 11/22/2020   LDLCALC 26 11/22/2020   ALT 15 11/22/2020   AST 17 11/22/2020   NA 139 11/22/2020   K 4.6 11/22/2020   CL 103 11/22/2020   CREATININE 1.91 (H) 11/22/2020   BUN 34 (H) 11/22/2020   CO2 28 11/22/2020   TSH 1.76 11/22/2020   PSA 3.68  11/22/2020   INR 2.3 11/21/2020   HGBA1C 8.2 (H) 11/22/2020   MICROALBUR 22.6 (H) 11/22/2020    Assessment & Plan:   Shannan was seen today for annual exam, hypertension, hyperlipidemia, diabetes, anemia, congestive heart failure, coronary artery disease and atrial fibrillation.  Diagnoses and all orders for this visit:  Type 2 diabetes mellitus with complication, with long-term current use of insulin (Melbeta)- His A1c is up to 8.2%.  I recommended that he add an SGLT2 inhibitor to his insulin regimen. -     Amb Referral to Nutrition and Diabetic Education -     Glucagon (GVOKE HYPOPEN 2-PACK) 1 MG/0.2ML SOAJ; Inject 1 Act into the skin daily as needed. -     Continuous Blood Gluc Sensor (FREESTYLE LIBRE 2 SENSOR) MISC; 1 Act by Does not apply route daily. -     Continuous Blood Gluc Receiver (FREESTYLE LIBRE 2 READER) DEVI; 1 Act by Does not apply route daily. -     Microalbumin / creatinine urine ratio; Future -     Urinalysis, Routine w reflex microscopic; Future -     Urinalysis, Routine w reflex microscopic -     Microalbumin / creatinine urine ratio -     dapagliflozin propanediol (FARXIGA) 10 MG TABS tablet; Take 1 tablet (10 mg total) by mouth daily before breakfast.  Stage 3b chronic kidney disease ([place])- Will add dapagliflozin to reduce the risk of progression and complications from chronic kidney disease. -     Basic metabolic panel; Future -     Microalbumin / creatinine urine ratio; Future -     Urinalysis, Routine w reflex microscopic; Future -     Urinalysis, Routine w reflex microscopic -     Microalbumin / creatinine urine ratio -     Basic metabolic panel -     dapagliflozin propanediol (FARXIGA) 10 MG TABS tablet; Take 1 tablet (10 mg total) by mouth daily before breakfast.  Deficiency anemia- His anemia is stable.  His vitamin levels are normal.  This is likely the anemia of chronic disease and renal insufficiency. -     CBC with Differential/Platelet; Future -      Vitamin B12; Future -     Iron; Future -     Folate; Future -     Ferritin; Future -  Vitamin B1; Future -     Reticulocytes; Future -     Reticulocytes -     Vitamin B1 -     Ferritin -     Folate -     Iron -     Vitamin B12 -     CBC with Differential/Platelet  Thrombocytopenia ([place])- His platelet count is normal now. -     CBC with Differential/Platelet; Future -     Vitamin B12; Future -     Folate; Future -     Vitamin B1; Future -     Vitamin B1 -     Folate -     Vitamin B12 -     CBC with Differential/Platelet  Type 2 diabetes mellitus with stage 3b chronic kidney disease, without long-term current use of insulin ([place])- See above. -     HM Diabetes Foot Exam -     Amb Referral to Nutrition and Diabetic Education -     Glucagon (GVOKE HYPOPEN 2-PACK) 1 MG/0.2ML SOAJ; Inject 1 Act into the skin daily as needed. -     Continuous Blood Gluc Sensor (FREESTYLE LIBRE 2 SENSOR) MISC; 1 Act by Does not apply route daily. -     Continuous Blood Gluc Receiver (FREESTYLE LIBRE 2 READER) DEVI; 1 Act by Does not apply route daily. -     Hemoglobin A1c; Future -     Hemoglobin A1c -     dapagliflozin propanediol (FARXIGA) 10 MG TABS tablet; Take 1 tablet (10 mg total) by mouth daily before breakfast.  Insulin-requiring or dependent type II diabetes mellitus ([place]) -     Amb Referral to Nutrition and Diabetic Education -     Glucagon (GVOKE HYPOPEN 2-PACK) 1 MG/0.2ML SOAJ; Inject 1 Act into the skin daily as needed. -     Continuous Blood Gluc Sensor (FREESTYLE LIBRE 2 SENSOR) MISC; 1 Act by Does not apply route daily. -     Continuous Blood Gluc Receiver (FREESTYLE LIBRE 2 READER) DEVI; 1 Act by Does not apply route daily.  Hyperlipidemia LDL goal <70- He has achieved his LDL goal is doing well on the statin. -     Lipid panel; Future -     Hepatic function panel; Future -     Thyroid Panel With TSH; Future -     Thyroid Panel With TSH -     Hepatic function panel -      Lipid panel  Prostate cancer screening- His PSA is normal. -     PSA; Future -     PSA  Coronary artery disease due to lipid rich plaque- He has subtle changes on his EKG.  I have asked him to have a follow-up with a cardiologist. -     EKG 12-Lead -     Ambulatory referral to Cardiology  Chronic systolic HF (heart failure) ([place])- He has a normal volume status. -     Ambulatory referral to Cardiology  Essential hypertension- His blood pressure is adequately well controlled. -     EKG 12-Lead -     Thyroid Panel With TSH; Future -     Thyroid Panel With TSH  Paroxysmal atrial fibrillation ([place])- He has good rate and rhythm control.  Will continue anticoagulation with Coumadin. -     Thyroid Panel With TSH; Future -     Thyroid Panel With TSH  Encounter for general adult medical examination with abnormal findings- Exam completed, labs reviewed, vaccines reviewed  and are up-to-date, cancer screenings are up-to-date, patient education was given.   I have discontinued Vernor Wedge's silver sulfADIAZINE. I am also having him start on Gvoke HypoPen 2-Pack, FreeStyle Libre 2 Sensor, [employer] 2 Reader, and dapagliflozin propanediol. Additionally, I am having him maintain his accu-chek soft touch, aspirin EC, acetaminophen, albuterol, Accu-Chek Aviva Plus, pramipexole, gabapentin, Accu-Chek Aviva Plus, carvedilol, insulin lispro, Insulin Pen Needle, warfarin, torsemide, pantoprazole, rosuvastatin, and Lantus SoloStar.  Meds ordered this encounter  Medications  . Glucagon (GVOKE HYPOPEN 2-PACK) 1 MG/0.2ML SOAJ    Sig: Inject 1 Act into the skin daily as needed.    Dispense:  2 mL    Refill:  5  . Continuous Blood Gluc Sensor (FREESTYLE LIBRE 2 SENSOR) MISC    Sig: 1 Act by Does not apply route daily.    Dispense:  2 each    Refill:  5  . Continuous Blood Gluc Receiver (FREESTYLE LIBRE 2 READER) DEVI    Sig: 1 Act by Does not apply route daily.    Dispense:  2 each    Refill:  5  .  dapagliflozin propanediol (FARXIGA) 10 MG TABS tablet    Sig: Take 1 tablet (10 mg total) by mouth daily before breakfast.    Dispense:  90 tablet    Refill:  1     Follow-up: Return in about 3 months (around 02/22/2021).  Scarlette Calico, MD

## 2020-11-22 NOTE — Patient Instructions (Signed)
[employer] medicine (25th ed., pp. [phone]). [place], [place]: Elsevier.">  Anemia  Anemia is a condition in which there is not enough red blood cells or hemoglobin in the blood. Hemoglobin is a substance in red blood cells that carries oxygen. When you do not have enough red blood cells or hemoglobin (are anemic), your body cannot get enough oxygen and your organs may not work properly. As a result, you may feel very tired or have other problems. What are the causes? Common causes of anemia include:  Excessive bleeding. Anemia can be caused by excessive bleeding inside or outside the body, including bleeding from the intestines or from heavy menstrual periods in females.  Poor nutrition.  Long-lasting (chronic) kidney, thyroid, and liver disease.  Bone marrow disorders, spleen problems, and blood disorders.  Cancer and treatments for cancer.  HIV (human immunodeficiency virus) and AIDS (acquired immunodeficiency syndrome).  Infections, medicines, and autoimmune disorders that destroy red blood cells. What are the signs or symptoms? Symptoms of this condition include:  Minor weakness.  Dizziness.  Headache, or difficulties concentrating and sleeping.  Heartbeats that feel irregular or faster than normal (palpitations).  Shortness of breath, especially with exercise.  Pale skin, lips, and nails, or cold hands and feet.  Indigestion and nausea. Symptoms may occur suddenly or develop slowly. If your anemia is mild, you may not have symptoms. How is this diagnosed? This condition is diagnosed based on blood tests, your medical history, and a physical exam. In some cases, a test may be needed in which cells are removed from the soft tissue inside of a bone and looked at under a microscope (bone marrow biopsy). Your health care provider may also check your stool (feces) for blood and may do additional testing to look for the cause of your bleeding. Other tests may  include:  Imaging tests, such as a CT scan or MRI.  A procedure to see inside your esophagus and stomach (endoscopy).  A procedure to see inside your colon and rectum (colonoscopy). How is this treated? Treatment for this condition depends on the cause. If you continue to lose a lot of blood, you may need to be treated at a hospital. Treatment may include:  Taking supplements of iron, vitamin Q68, or folic acid.  Taking a hormone medicine (erythropoietin) that can help to stimulate red blood cell growth.  Having a blood transfusion. This may be needed if you lose a lot of blood.  Making changes to your diet.  Having surgery to remove your spleen. Follow these instructions at home:  Take over-the-counter and prescription medicines only as told by your health care provider.  Take supplements only as told by your health care provider.  Follow any diet instructions that you were given by your health care provider.  Keep all follow-up visits as told by your health care provider. This is important. Contact a health care provider if:  You develop new bleeding anywhere in the body. Get help right away if:  You are very weak.  You are short of breath.  You have pain in your abdomen or chest.  You are dizzy or feel faint.  You have trouble concentrating.  You have bloody stools, black stools, or tarry stools.  You vomit repeatedly or you vomit up blood. These symptoms may represent a serious problem that is an emergency. Do not wait to see if the symptoms will go away. Get medical help right away. Call your local emergency services (911 in the [place] Do not  drive yourself to the hospital. Summary  Anemia is a condition in which you do not have enough red blood cells or enough of a substance in your red blood cells that carries oxygen (hemoglobin).  Symptoms may occur suddenly or develop slowly.  If your anemia is mild, you may not have symptoms.  This condition is  diagnosed with blood tests, a medical history, and a physical exam. Other tests may be needed.  Treatment for this condition depends on the cause of the anemia. This information is not intended to replace advice given to you by your health care provider. Make sure you discuss any questions you have with your health care provider. Document Revised: 05/26/2019 Document Reviewed: 05/26/2019 Elsevier Patient Education  2021 [employer] Inc.  

## 2020-11-23 LAB — CBC WITH DIFFERENTIAL/PLATELET
Basophils Absolute: 0 10*3/uL (ref 0.0–0.1)
Basophils Relative: 0.5 % (ref 0.0–3.0)
Eosinophils Absolute: 0.2 10*3/uL (ref 0.0–0.7)
Eosinophils Relative: 2.5 % (ref 0.0–5.0)
HCT: 36.5 % — ABNORMAL LOW (ref 39.0–52.0)
Hemoglobin: 12.6 g/dL — ABNORMAL LOW (ref 13.0–17.0)
Lymphocytes Relative: 26.8 % (ref 12.0–46.0)
Lymphs Abs: 1.8 10*3/uL (ref 0.7–4.0)
MCHC: 34.5 g/dL (ref 30.0–36.0)
MCV: 86.1 fl (ref 78.0–100.0)
Monocytes Absolute: 0.5 10*3/uL (ref 0.1–1.0)
Monocytes Relative: 7.4 % (ref 3.0–12.0)
Neutro Abs: 4.2 10*3/uL (ref 1.4–7.7)
Neutrophils Relative %: 62.8 % (ref 43.0–77.0)
Platelets: 170 10*3/uL (ref 150.0–400.0)
RBC: 4.24 Mil/uL (ref 4.22–5.81)
RDW: 13.5 % (ref 11.5–15.5)
WBC: 6.7 10*3/uL (ref 4.0–10.5)

## 2020-11-23 LAB — HEMOGLOBIN A1C: Hgb A1c MFr Bld: 8.2 % — ABNORMAL HIGH (ref 4.6–6.5)

## 2020-11-23 LAB — HEPATIC FUNCTION PANEL
ALT: 15 U/L (ref 0–53)
AST: 17 U/L (ref 0–37)
Albumin: 4.2 g/dL (ref 3.5–5.2)
Alkaline Phosphatase: 74 U/L (ref 39–117)
Bilirubin, Direct: 0.2 mg/dL (ref 0.0–0.3)
Total Bilirubin: 0.9 mg/dL (ref 0.2–1.2)
Total Protein: 6.3 g/dL (ref 6.0–8.3)

## 2020-11-23 LAB — URINALYSIS, ROUTINE W REFLEX MICROSCOPIC
Bilirubin Urine: NEGATIVE
Ketones, ur: NEGATIVE
Leukocytes,Ua: NEGATIVE
Nitrite: NEGATIVE
Specific Gravity, Urine: >=1.03 — AB (ref 1.000–1.030)
Total Protein, Urine: 100 — AB
Urine Glucose: 500 — AB
Urobilinogen, UA: 1 (ref 0.0–1.0)
pH: 5.5 (ref 5.0–8.0)

## 2020-11-23 LAB — BASIC METABOLIC PANEL
BUN: 34 mg/dL — ABNORMAL HIGH (ref 6–23)
CO2: 28 mEq/L (ref 19–32)
Calcium: 9.5 mg/dL (ref 8.4–10.5)
Chloride: 103 mEq/L (ref 96–112)
Creatinine, Ser: 1.91 mg/dL — ABNORMAL HIGH (ref 0.40–1.50)
GFR: 35.32 mL/min — ABNORMAL LOW (ref >60.00)
Glucose, Bld: 266 mg/dL — ABNORMAL HIGH (ref 70–99)
Potassium: 4.6 mEq/L (ref 3.5–5.1)
Sodium: 139 mEq/L (ref 135–145)

## 2020-11-23 LAB — LIPID PANEL
Cholesterol: 88 mg/dL (ref 0–200)
HDL: 27.6 mg/dL — ABNORMAL LOW (ref >39.00)
LDL Cholesterol: 26 mg/dL (ref 0–99)
NonHDL: 60.83
Total CHOL/HDL Ratio: 3
Triglycerides: 173 mg/dL — ABNORMAL HIGH (ref 0.0–149.0)
VLDL: 34.6 mg/dL (ref 0.0–40.0)

## 2020-11-23 LAB — VITAMIN B12: Vitamin B-12: 529 pg/mL (ref 211–911)

## 2020-11-23 LAB — RETICULOCYTES
ABS Retic: 87360 cells/uL (ref 25000–90000)
Retic Ct Pct: 2.1 %

## 2020-11-23 LAB — MICROALBUMIN / CREATININE URINE RATIO
Creatinine,U: 103.6 mg/dL
Microalb Creat Ratio: 21.8 mg/g (ref 0.0–30.0)
Microalb, Ur: 22.6 mg/dL — ABNORMAL HIGH (ref 0.0–1.9)

## 2020-11-23 LAB — PSA: PSA: 3.68 ng/mL (ref 0.10–4.00)

## 2020-11-23 LAB — IRON: Iron: 54 ug/dL (ref 42–165)

## 2020-11-23 LAB — FERRITIN: Ferritin: 64.6 ng/mL (ref 22.0–322.0)

## 2020-11-23 LAB — FOLATE: Folate: 16.7 ng/mL (ref >5.9)

## 2020-11-23 MED ORDER — DAPAGLIFLOZIN PROPANEDIOL 10 MG PO TABS: 10.0000 mg | ORAL_TABLET | Freq: Every day | ORAL | 1 refills | Status: DC

## 2020-11-27 ENCOUNTER — Encounter: Payer: Self-pay | Admitting: Internal Medicine

## 2020-11-27 LAB — THYROID PANEL WITH TSH
Free Thyroxine Index: 2.3 (ref 1.4–3.8)
T3 Uptake: 35 % (ref 22–35)
T4, Total: 6.5 ug/dL (ref 4.9–10.5)
TSH: 1.76 mIU/L (ref 0.40–4.50)

## 2020-11-27 LAB — VITAMIN B1: Vitamin B1 (Thiamine): 24 nmol/L (ref 8–30)

## 2020-11-28 DIAGNOSIS — Z0001 Encounter for general adult medical examination with abnormal findings: Secondary | ICD-10-CM | POA: Insufficient documentation

## 2020-12-15 ENCOUNTER — Other Ambulatory Visit: Payer: Self-pay | Admitting: Internal Medicine

## 2020-12-15 ENCOUNTER — Telehealth: Payer: Self-pay | Admitting: Internal Medicine

## 2020-12-15 DIAGNOSIS — N1832 Chronic kidney disease, stage 3b: Secondary | ICD-10-CM

## 2020-12-15 DIAGNOSIS — Z794 Long term (current) use of insulin: Secondary | ICD-10-CM

## 2020-12-15 MED ORDER — ACCU-CHEK SOFTCLIX LANCETS MISC
1.0000 | Freq: Four times a day (QID) | 5 refills | Status: DC | PRN
Start: 2020-12-15 — End: 2021-10-09

## 2020-12-15 NOTE — Telephone Encounter (Signed)
1.Medication Requested: Lancets (ACCU-CHEK SOFT TOUCH) lancets   2. Pharmacy (Name, Street, [place]): [facility] [phone] - [place], [address]  3. On Med List: yes   4. Last Visit with PCP: 11-22-20  5. Next visit date with PCP: n/a    Agent: Please be advised that RX refills may take up to 3 business days. We ask that you follow-up with your pharmacy.

## 2020-12-20 ENCOUNTER — Other Ambulatory Visit: Payer: Self-pay | Admitting: Internal Medicine

## 2020-12-20 DIAGNOSIS — Z794 Long term (current) use of insulin: Secondary | ICD-10-CM

## 2020-12-20 DIAGNOSIS — E1152 Type 2 diabetes mellitus with diabetic peripheral angiopathy with gangrene: Secondary | ICD-10-CM

## 2020-12-26 ENCOUNTER — Other Ambulatory Visit: Payer: Self-pay

## 2020-12-26 ENCOUNTER — Ambulatory Visit (INDEPENDENT_AMBULATORY_CARE_PROVIDER_SITE_OTHER): Payer: Medicare Other | Admitting: Pharmacist

## 2020-12-26 DIAGNOSIS — I480 Paroxysmal atrial fibrillation: Secondary | ICD-10-CM

## 2020-12-26 DIAGNOSIS — Z7901 Long term (current) use of anticoagulants: Secondary | ICD-10-CM

## 2020-12-26 LAB — POCT INR: INR: 2.2 (ref 2.0–3.0)

## 2020-12-27 ENCOUNTER — Encounter: Payer: Self-pay | Admitting: Cardiovascular Disease

## 2020-12-27 ENCOUNTER — Ambulatory Visit (INDEPENDENT_AMBULATORY_CARE_PROVIDER_SITE_OTHER): Payer: Medicare Other | Admitting: Cardiovascular Disease

## 2020-12-27 VITALS — BP 110/58 | HR 63 | Resp 18 | Ht 65.0 in | Wt 181.0 lb

## 2020-12-27 DIAGNOSIS — I2510 Atherosclerotic heart disease of native coronary artery without angina pectoris: Secondary | ICD-10-CM

## 2020-12-27 DIAGNOSIS — E785 Hyperlipidemia, unspecified: Secondary | ICD-10-CM | POA: Diagnosis not present

## 2020-12-27 DIAGNOSIS — I480 Paroxysmal atrial fibrillation: Secondary | ICD-10-CM

## 2020-12-27 DIAGNOSIS — I739 Peripheral vascular disease, unspecified: Secondary | ICD-10-CM

## 2020-12-27 DIAGNOSIS — I5022 Chronic systolic (congestive) heart failure: Secondary | ICD-10-CM

## 2020-12-27 NOTE — Progress Notes (Signed)
Cardiology Office Note   Date:  12/27/2020   ID:  William [name], DOB Nov 05, 1950, MRN [id number]  PCP:  [name], MD  Cardiologist:   [name], MD   No chief complaint on file.     History of Present Illness: William [name] is a 70 y.o. male who Is here today for a follow-up visit regarding peripheral arterial disease, coronary artery disease and paroxysmal atrial fibrillation. He has known history of inferior myocardial infarction complicated by VSD in September, 2017. He is status post one-vessel CABG and VSD repair. He had postoperative atrial fibrillation and has been on anticoagulation. He had amputation of the right great toe while hospitalized for gangrene. The patient has known history of diabetes.  He is known to have peripheral arterial disease. Angiography in November, 2017 showed no significant aortoiliac disease, significant right distal SFA stenosis and one-vessel runoff below the knee via the peroneal artery with reconstitution of the dorsalis pedis distally. I performed successful drug-coated balloon angioplasty of the right SFA. He is s/p right transmetatarsal amputation for osteomyelitis.    He had angiography to the left leg in March 2018 which showed moderate stenosis affecting the left popliteal artery and TP trunk with 1 vessel runoff below the knee via the peroneal artery which was a large dominant vessel and reconstituted the dorsalis pedis and posterior tibial at the level of the ankle. There was brisk flow to the toes. Thus, no revascularization was performed.  He has been doing reasonably well overall with no recent chest pain.  He reports stable exertional dyspnea.  He has poor balance and fell once in the last 6 months.  He does not exercise on a regular basis.  He takes his medications regularly.  He has chronic kidney disease and follows with nephrology with stable creatinine around 1.9.    Past Medical History:  Diagnosis Date   AKI (acute kidney  injury) ([place])    With STEMI in 8185   Chronic systolic CHF (congestive heart failure) ([place])    Diabetes mellitus without complication ([name])    Hx of adenomatous colonic polyps 04/07/2018   Hyperlipidemia    Hypertension    Paroxysmal atrial fibrillation ([name])    STEMI (ST elevation myocardial infarction) ([place]) 2017    Past Surgical History:  Procedure Laterality Date   ABDOMINAL AORTOGRAM W/LOWER EXTREMITY N/A 09/19/2016   Procedure: Abdominal Aortogram w/Lower Extremity;  Surgeon: Wellington Hampshire, MD;  Location: [facility];  Service: Cardiovascular;  Laterality: N/A;   AMPUTATION Right 03/23/2016   Procedure: 1st and 2nd Ray Amputation Right Foot;  Surgeon: Newt Minion, MD;  Location: [facility];  Service: Orthopedics;  Laterality: Right;   AMPUTATION Right 06/21/2016   Procedure: RIGHT TRANSMETATARSAL AMPUTATION;  Surgeon: Newt Minion, MD;  Location: [facility];  Service: Orthopedics;  Laterality: Right;   CARDIAC CATHETERIZATION N/A 03/13/2016   Procedure: Right/Left Heart Cath and Coronary Angiography;  Surgeon: Sherren Mocha, MD;  Location: [facility];  Service: Cardiovascular;  Laterality: N/A;   CARDIAC CATHETERIZATION N/A 03/13/2016   Procedure: IABP Insertion;  Surgeon: Sherren Mocha, MD;  Location: [facility];  Service: Cardiovascular;  Laterality: N/A;   CATARACT EXTRACTION, BILATERAL     CERVICAL FUSION  1982, 1992   has had 3 neck surgeries from breaking his neck   CORONARY ARTERY BYPASS GRAFT N/A 03/13/2016   Procedure: CORONARY ARTERY BYPASS GRAFTING (CABG) x 1 (SVG to OM) with EVH from LEFT  GREATER SAPHENOUS VEIN;  Surgeon: Ivin Poot, MD;  Location: [facility];  Service: Open Heart Surgery;  Laterality: N/A;   LOWER EXTREMITY ANGIOGRAM  05/02/2016   Procedure: Lower Extremity Angiogram;  Surgeon: Wellington Hampshire, MD;  Location: [facility];  Service: Cardiovascular;;  Limited left femoral runoff right femoral runoff   PERIPHERAL VASCULAR  CATHETERIZATION N/A 05/02/2016   Procedure: Abdominal Aortogram;  Surgeon: Wellington Hampshire, MD;  Location: [facility];  Service: Cardiovascular;  Laterality: N/A;   PERIPHERAL VASCULAR CATHETERIZATION Right 05/02/2016   Procedure: Peripheral Vascular Balloon Angioplasty;  Surgeon: Wellington Hampshire, MD;  Location: [facility];  Service: Cardiovascular;  Laterality: Right;  SFA   TEE WITHOUT CARDIOVERSION N/A 03/13/2016   Procedure: TRANSESOPHAGEAL ECHOCARDIOGRAM (TEE);  Surgeon: Ivin Poot, MD;  Location: [facility];  Service: Open Heart Surgery;  Laterality: N/A;   VSD REPAIR N/A 03/13/2016   Procedure: VENTRICULAR SEPTAL DEFECT (VSD) REPAIR;  Surgeon: Ivin Poot, MD;  Location: [facility];  Service: Open Heart Surgery;  Laterality: N/A;     Current Outpatient Medications  Medication Sig Dispense Refill   Accu-Chek Softclix Lancets lancets 1 each by Other route 4 (four) times daily as needed for other. Use as instructed 200 each 5   acetaminophen (TYLENOL) 650 MG CR tablet Take 650 mg by mouth every 8 (eight) hours as needed for pain.     albuterol (PROVENTIL HFA;VENTOLIN HFA) 108 (90 Base) MCG/ACT inhaler Inhale 2 puffs into the lungs every 6 (six) hours as needed for wheezing or shortness of breath. 1 Inhaler 0   aspirin EC 81 MG tablet Take 1 tablet (81 mg total) by mouth daily. 30 tablet 3   Blood Glucose Monitoring Suppl (ACCU-CHEK AVIVA PLUS) w/Device KIT 1 each by Does not apply route 4 (four) times daily -  before meals and at bedtime. 1 kit 0   carvedilol (COREG) 3.125 MG tablet TAKE 1 TABLET(3.125 MG) BY MOUTH TWICE DAILY WITH A MEAL 180 tablet 1   Continuous Blood Gluc Receiver (FREESTYLE LIBRE 2 READER) DEVI 1 Act by Does not apply route daily. 2 each 5   Continuous Blood Gluc Sensor (FREESTYLE LIBRE 2 SENSOR) MISC 1 Act by Does not apply route daily. 2 each 5   dapagliflozin propanediol (FARXIGA) 10 MG TABS tablet Take 1 tablet (10 mg total) by mouth daily before  breakfast. 90 tablet 1   gabapentin (NEURONTIN) 300 MG capsule Take 1 capsule (300 mg total) by mouth 2 (two) times daily. 180 capsule 3   Glucagon (GVOKE HYPOPEN 2-PACK) 1 MG/0.2ML SOAJ Inject 1 Act into the skin daily as needed. 2 mL 5   glucose blood (ACCU-CHEK AVIVA PLUS) test strip TEST 3 TIMES DAILY PRIOR TO MEALS AND AT BEDTIME 100 strip 11   insulin lispro (HUMALOG KWIKPEN) 100 UNIT/ML KwikPen ADMINISTER UP TO 10 UNITS UNDER THE SKIN THREE TIMES DAILY AS DIRECTED PER SLIDING SCALE 27 mL 3   Insulin Pen Needle 31G X 8 MM MISC 1 each by Does not apply route 4 (four) times daily. 100 each 12   LANTUS SOLOSTAR 100 UNIT/ML Solostar Pen ADMINISTER 55 UNITS UNDER THE SKIN DAILY 18 mL 1   pantoprazole (PROTONIX) 40 MG tablet TAKE 1 TABLET BY MOUTH TWICE DAILY BEFORE A MEAL AND 30 MINUTES BEFORE BREAKFAST AND SUPPER 180 tablet 1   pramipexole (MIRAPEX) 0.125 MG tablet TAKE 1 TABLET(0.125 MG) BY MOUTH AT BEDTIME 90 tablet 0   rosuvastatin (  CRESTOR) 40 MG tablet TAKE 1 TABLET BY MOUTH EVERY DAY 90 tablet 3   torsemide (DEMADEX) 20 MG tablet TAKE 1 TABLET(20 MG) BY MOUTH 3 TIMES A WEEK 15 tablet 10   warfarin (COUMADIN) 5 MG tablet TAKE 1 TO 1 AND 1/2 TABLET BY MOUTH DAILY AS DIRECTED BY COUMADIN CLINIC 180 tablet 1   No current facility-administered medications for this visit.    Allergies:   Morphine and related and Latex    Social History:  The patient  reports that he quit smoking about 5 years ago. His smoking use included cigarettes. He smoked an average of 0.50 packs per day. He has never used smokeless tobacco. He reports that he does not drink alcohol and does not use drugs.   Family History:  Not able to obtain due to distress.  ROS:  Please see the history of present illness.   Otherwise, review of systems are positive for none.   All other systems are reviewed and negative.    PHYSICAL EXAM: VS:  BP (!) 110/58 (BP Location: Left Arm, Patient Position: Sitting, Cuff Size: Normal)    Pulse 63   Resp 18   Ht '5\' 5"'  (1.651 m)   Wt 181 lb (82.1 kg)   SpO2 95%   BMI 30.12 kg/m  , BMI Body mass index is 30.12 kg/m. GEN: Well nourished, well developed, in no acute distress  HEENT: normal  Neck: no JVD, carotid bruits, or masses Cardiac: RRR; no  rubs, or gallops,no edema . No murmurs Respiratory:  clear to auscultation bilaterally, normal work of breathing GI: soft, nontender, nondistended, + BS MS: no deformity or atrophy  Skin: warm and dry, no rash Neuro:  Strength and sensation are intact Psych: euthymic mood, full affect Vascular: Distal pulses are not palpable.  No ulceration.  EKG:  EKG is not ordered today.   Recent Labs: 11/22/2020: ALT 15; BUN 34; Creatinine, Ser 1.91; Hemoglobin 12.6; Platelets 170.0; Potassium 4.6; Sodium 139; TSH 1.76    Lipid Panel    Component Value Date/Time   CHOL 88 11/22/2020 1619   CHOL 90 (L) 01/05/2019 1123   TRIG 173.0 (H) 11/22/2020 1619   HDL 27.60 (L) 11/22/2020 1619   HDL 34 (L) 01/05/2019 1123   CHOLHDL 3 11/22/2020 1619   VLDL 34.6 11/22/2020 1619   LDLCALC 26 11/22/2020 1619   LDLCALC 24 01/05/2019 1123   LDLCALC 45 03/20/2017 1144      Wt Readings from Last 3 Encounters:  12/27/20 181 lb (82.1 kg)  11/22/20 181 lb (82.1 kg)  09/19/20 186 lb (84.4 kg)      No flowsheet data found.     ASSESSMENT AND PLAN:  1.  Peripheral arterial disease :  He is status post drug-coated balloon angioplasty of the right SFA in 2017. He has significant tibial disease bilaterally. Currently he has no ulceration or claudication. Continue medical therapy. I reviewed most recent Doppler studies with him done in March.  It showed borderline ABI bilaterally in the 0.9 range.  Duplex showed moderate right SFA disease.  Recommend continuing aggressive medical therapy.  2. Chronic systolic heart failure: He appears to be euvolemic.   He is no longer on losartan due to low blood pressure and chronic kidney disease.  Most recent  echo in 2018 showed an EF of 45 to 50%.  He appears to be euvolemic on current dose of torsemide 20 mg 3 times a week.  I asked him to continue the same  dose.  Continue small dose carvedilol and [place].  3. Paroxysmal atrial fibrillation: He is maintaining in sinus rhythm.  He is on long-term anticoagulation with warfarin with no bleeding complications.  He had his INR checked yesterday and it was therapeutic.  4. Coronary artery disease involving native coronary arteries without angina:  status post one-vessel CABG and VSD repair. Continue medical therapy.  5. Diabetes mellitus: Improved from before.  6. Hyperlipidemia: I reviewed most recent lipid profile done in May which showed an LDL of 26 and triglyceride of 173.  I instructed him to continue rosuvastatin 40 mg daily.    Disposition:   follow-up with me in 6 months  Signed,  [name], MD  12/27/2020 8:11 AM    [place]

## 2020-12-27 NOTE — Patient Instructions (Signed)

## 2021-01-16 ENCOUNTER — Telehealth: Payer: Self-pay | Admitting: Cardiovascular Disease

## 2021-01-16 DIAGNOSIS — M79605 Pain in left leg: Secondary | ICD-10-CM

## 2021-01-16 DIAGNOSIS — M79604 Pain in right leg: Secondary | ICD-10-CM

## 2021-01-16 DIAGNOSIS — I739 Peripheral vascular disease, unspecified: Secondary | ICD-10-CM

## 2021-01-16 NOTE — Telephone Encounter (Signed)
Returned call to pt/daughter she states pt is having cramping top of his shin. Denies any other sx. Pt is taking medication as directed. Torsemide 3 times a week.

## 2021-01-16 NOTE — Telephone Encounter (Signed)
Returned the call to the patient's daughter the dpr. The daughter stated that the patient started complaining of right leg pain from the shin down after he had his office visit with Dr. Fletcher Anon. The pain is constant, lasting throughout the day, but is worse when he ambulates. She stated that this is a cramping pain that is not relieved with Tylenol.   The daughter will be out of town from June 27th-August 4th.

## 2021-01-16 NOTE — Telephone Encounter (Signed)
Patient's daughter states the patient was told if he had any leg pain and cramps in his legs to let Dr. Fletcher Anon know. She states he has leg pain and is having cramping on the top part of his right leg on the shin.

## 2021-01-19 NOTE — Telephone Encounter (Signed)
Most recent Doppler studies in March were stable but if he is having the symptoms, we should repeat ABI and LEA

## 2021-01-20 NOTE — Telephone Encounter (Signed)
The patient has an appointment on 7/28 for the dopplers

## 2021-01-20 NOTE — Telephone Encounter (Signed)
The daughter has been made aware. Orders have been placed and message sent to scheduling.

## 2021-01-20 NOTE — Telephone Encounter (Signed)
Called and offered the daughter an appointment for next Thursday at 2. She stated that she was not able to make that. Will have scheduling reach out to schedule them.

## 2021-01-25 ENCOUNTER — Ambulatory Visit (HOSPITAL_COMMUNITY)
Admission: RE | Admit: 2021-01-25 | Discharge: 2021-01-25 | Disposition: A | Discharge status: Home / Self Care | Payer: Medicare Other | Source: Ambulatory Visit | Attending: Cardiovascular Disease | Admitting: Cardiovascular Disease

## 2021-01-25 ENCOUNTER — Other Ambulatory Visit (HOSPITAL_COMMUNITY): Payer: Self-pay | Admitting: Cardiovascular Disease

## 2021-01-25 ENCOUNTER — Other Ambulatory Visit: Payer: Self-pay

## 2021-01-25 DIAGNOSIS — M79605 Pain in left leg: Secondary | ICD-10-CM

## 2021-01-25 DIAGNOSIS — I739 Peripheral vascular disease, unspecified: Secondary | ICD-10-CM

## 2021-01-25 DIAGNOSIS — M79604 Pain in right leg: Secondary | ICD-10-CM | POA: Insufficient documentation

## 2021-01-26 ENCOUNTER — Ambulatory Visit (HOSPITAL_COMMUNITY)
Admission: RE | Admit: 2021-01-26 | Payer: Medicare Other | Source: Ambulatory Visit | Attending: Cardiovascular Disease | Admitting: Cardiovascular Disease

## 2021-02-13 ENCOUNTER — Other Ambulatory Visit: Payer: Self-pay

## 2021-02-13 ENCOUNTER — Ambulatory Visit (INDEPENDENT_AMBULATORY_CARE_PROVIDER_SITE_OTHER): Payer: Medicare Other

## 2021-02-13 DIAGNOSIS — I480 Paroxysmal atrial fibrillation: Secondary | ICD-10-CM

## 2021-02-13 DIAGNOSIS — Z5181 Encounter for therapeutic drug level monitoring: Secondary | ICD-10-CM

## 2021-02-13 LAB — POCT INR: INR: 2.5 (ref 2.0–3.0)

## 2021-02-13 NOTE — Patient Instructions (Signed)
Continue taking 1.5 tablets daily except for 1 tablet every Monday, Wednesday and Friday.  Recheck INR in 6 weeks.

## 2021-02-26 ENCOUNTER — Other Ambulatory Visit: Payer: Self-pay | Admitting: Internal Medicine

## 2021-02-26 DIAGNOSIS — E1152 Type 2 diabetes mellitus with diabetic peripheral angiopathy with gangrene: Secondary | ICD-10-CM

## 2021-02-26 DIAGNOSIS — Z794 Long term (current) use of insulin: Secondary | ICD-10-CM

## 2021-03-27 ENCOUNTER — Telehealth: Payer: Self-pay

## 2021-03-27 ENCOUNTER — Ambulatory Visit (INDEPENDENT_AMBULATORY_CARE_PROVIDER_SITE_OTHER): Payer: Medicare Other

## 2021-03-27 ENCOUNTER — Other Ambulatory Visit: Payer: Self-pay

## 2021-03-27 DIAGNOSIS — I480 Paroxysmal atrial fibrillation: Secondary | ICD-10-CM | POA: Diagnosis not present

## 2021-03-27 DIAGNOSIS — Z5181 Encounter for therapeutic drug level monitoring: Secondary | ICD-10-CM | POA: Diagnosis not present

## 2021-03-27 LAB — POCT INR: INR: 2.2 (ref 2.0–3.0)

## 2021-03-27 NOTE — Telephone Encounter (Signed)
Lmom to call to r/s coumadin check

## 2021-03-27 NOTE — Patient Instructions (Signed)
Continue taking 1.5 tablets daily except for 1 tablet every Monday, Wednesday and Friday.  Recheck INR in 6 weeks.

## 2021-04-03 DIAGNOSIS — E113313 Type 2 diabetes mellitus with moderate nonproliferative diabetic retinopathy with macular edema, bilateral: Secondary | ICD-10-CM | POA: Diagnosis not present

## 2021-04-03 DIAGNOSIS — H26492 Other secondary cataract, left eye: Secondary | ICD-10-CM | POA: Diagnosis not present

## 2021-04-03 DIAGNOSIS — Z961 Presence of intraocular lens: Secondary | ICD-10-CM | POA: Diagnosis not present

## 2021-04-03 DIAGNOSIS — H35033 Hypertensive retinopathy, bilateral: Secondary | ICD-10-CM | POA: Diagnosis not present

## 2021-04-12 NOTE — Progress Notes (Signed)
[facility] Note  04/17/2021     CHIEF COMPLAINT Patient presents for Retina Evaluation   HISTORY OF PRESENT ILLNESS: William [name] is a 70 y.o. male who presents to the clinic today for:   HPI     Retina Evaluation   In both eyes.  This started 2 months ago.  Duration of 2 months.  Associated Symptoms Floaters.  Context:  distance vision and near vision.  I, the attending physician,  performed the HPI with the patient and updated documentation appropriately.        Comments   Pt states his vision is not as good as it used to be.  Pt states vision improved after CE OU (Dr. [name]), but vision is bad again x 2 months.  Pt denies eye pain or discomfort.  Pt has Hx of floaters OU; denies FOL.  BS/A1c: 201/8.2      Last edited by [name], MD on 04/17/2021 12:29 PM.    Pt is here on the referral of Dr. [name] for concern of DME OU, pt sees him every year for regular eye exams, pt has also had cataract sx with Dr. [name], pt has been diabetic for about 10 years, but only under control for the past 5 years, pt had a "massive" heart attack in 2017, pts last A1c was 8.2 in May and he is on insulin, pt is also on coumadin bc of the heart attack, pt has had half his right foot amputated  Referring physician: Hortencia Pilar, MD [place],  [place] [id number]  HISTORICAL INFORMATION:  Selected notes from the MEDICAL RECORD NUMBER Referred by Dr. [name] for eval of DME OU LEE:  Ocular Hx- PMH-    CURRENT MEDICATIONS: No current outpatient medications on file. (Ophthalmic Drugs)   No current facility-administered medications for this visit. (Ophthalmic Drugs)   Current Outpatient Medications (Other)  Medication Sig   Accu-Chek Softclix Lancets lancets 1 each by Other route 4 (four) times daily as needed for other. Use as instructed   acetaminophen (TYLENOL) 650 MG CR tablet Take 650 mg by mouth every 8 (eight) hours as  needed for pain.   albuterol (PROVENTIL HFA;VENTOLIN HFA) 108 (90 Base) MCG/ACT inhaler Inhale 2 puffs into the lungs every 6 (six) hours as needed for wheezing or shortness of breath.   aspirin EC 81 MG tablet Take 1 tablet (81 mg total) by mouth daily.   Blood Glucose Monitoring Suppl (ACCU-CHEK AVIVA PLUS) w/Device KIT 1 each by Does not apply route 4 (four) times daily -  before meals and at bedtime.   carvedilol (COREG) 3.125 MG tablet TAKE 1 TABLET(3.125 MG) BY MOUTH TWICE DAILY WITH A MEAL   Continuous Blood Gluc Receiver (FREESTYLE LIBRE 2 READER) DEVI 1 Act by Does not apply route daily.   Continuous Blood Gluc Sensor (FREESTYLE LIBRE 2 SENSOR) MISC 1 Act by Does not apply route daily.   dapagliflozin propanediol (FARXIGA) 10 MG TABS tablet Take 1 tablet (10 mg total) by mouth daily before breakfast.   gabapentin (NEURONTIN) 300 MG capsule Take 1 capsule (300 mg total) by mouth 2 (two) times daily.   Glucagon (GVOKE HYPOPEN 2-PACK) 1 MG/0.2ML SOAJ Inject 1 Act into the skin daily as needed.   glucose blood (ACCU-CHEK AVIVA PLUS) test strip TEST 3 TIMES DAILY PRIOR TO MEALS AND AT BEDTIME   insulin lispro (HUMALOG KWIKPEN) 100 UNIT/ML KwikPen ADMINISTER UP TO 10 UNITS UNDER THE  SKIN THREE TIMES DAILY AS DIRECTED PER SLIDING SCALE   Insulin Pen Needle 31G X 8 MM MISC 1 each by Does not apply route 4 (four) times daily.   LANTUS SOLOSTAR 100 UNIT/ML Solostar Pen ADMINISTER 55 UNITS UNDER THE SKIN DAILY   pantoprazole (PROTONIX) 40 MG tablet TAKE 1 TABLET BY MOUTH TWICE DAILY BEFORE A MEAL AND 30 MINUTES BEFORE BREAKFAST AND SUPPER   pramipexole (MIRAPEX) 0.125 MG tablet TAKE 1 TABLET(0.125 MG) BY MOUTH AT BEDTIME   rosuvastatin (CRESTOR) 40 MG tablet TAKE 1 TABLET BY MOUTH EVERY DAY   torsemide (DEMADEX) 20 MG tablet TAKE 1 TABLET(20 MG) BY MOUTH 3 TIMES A WEEK   warfarin (COUMADIN) 5 MG tablet TAKE 1 TO 1 AND 1/2 TABLET BY MOUTH DAILY AS DIRECTED BY COUMADIN CLINIC   No current  facility-administered medications for this visit. (Other)   REVIEW OF SYSTEMS: ROS   Positive for: Eyes Negative for: Constitutional, Gastrointestinal, Neurological, Skin, Genitourinary, Musculoskeletal, HENT, Endocrine, Cardiovascular, Respiratory, Psychiatric, Allergic/Imm, Heme/Lymph Last edited by Doneen Poisson on 04/17/2021  9:16 AM.    ALLERGIES Allergies  Allergen Reactions   Morphine And Related Shortness Of Breath and Other (See Comments)    UNSPECIFIED REACTION "Pt said it was too much"    Latex Rash   PAST MEDICAL HISTORY Past Medical History:  Diagnosis Date   AKI (acute kidney injury) ([facility])    With STEMI in 1601   Chronic systolic CHF (congestive heart failure) ([facility])    Diabetes mellitus without complication ([facility])    Hx of adenomatous colonic polyps 04/07/2018   Hyperlipidemia    Hypertension    Paroxysmal atrial fibrillation ([name])    STEMI (ST elevation myocardial infarction) ([facility]) 2017   Past Surgical History:  Procedure Laterality Date   ABDOMINAL AORTOGRAM W/LOWER EXTREMITY N/A 09/19/2016   Procedure: Abdominal Aortogram w/Lower Extremity;  Surgeon: Wellington Hampshire, MD;  Location: [facility];  Service: Cardiovascular;  Laterality: N/A;   AMPUTATION Right 03/23/2016   Procedure: 1st and 2nd Ray Amputation Right Foot;  Surgeon: Newt Minion, MD;  Location: [facility];  Service: Orthopedics;  Laterality: Right;   AMPUTATION Right 06/21/2016   Procedure: RIGHT TRANSMETATARSAL AMPUTATION;  Surgeon: Newt Minion, MD;  Location: [facility];  Service: Orthopedics;  Laterality: Right;   CARDIAC CATHETERIZATION N/A 03/13/2016   Procedure: Right/Left Heart Cath and Coronary Angiography;  Surgeon: Sherren Mocha, MD;  Location: [facility];  Service: Cardiovascular;  Laterality: N/A;   CARDIAC CATHETERIZATION N/A 03/13/2016   Procedure: IABP Insertion;  Surgeon: Sherren Mocha, MD;  Location: [facility];  Service: Cardiovascular;  Laterality: N/A;    CATARACT EXTRACTION, BILATERAL     CERVICAL FUSION  1982, 1992   has had 3 neck surgeries from breaking his neck   CORONARY ARTERY BYPASS GRAFT N/A 03/13/2016   Procedure: CORONARY ARTERY BYPASS GRAFTING (CABG) x 1 (SVG to OM) with EVH from [place];  Surgeon: Ivin Poot, MD;  Location: [facility];  Service: Open Heart Surgery;  Laterality: N/A;   LOWER EXTREMITY ANGIOGRAM  05/02/2016   Procedure: Lower Extremity Angiogram;  Surgeon: Wellington Hampshire, MD;  Location: [facility];  Service: Cardiovascular;;  Limited left femoral runoff right femoral runoff   PERIPHERAL VASCULAR CATHETERIZATION N/A 05/02/2016   Procedure: Abdominal Aortogram;  Surgeon: Wellington Hampshire, MD;  Location: [facility];  Service: Cardiovascular;  Laterality: N/A;   PERIPHERAL VASCULAR CATHETERIZATION Right  05/02/2016   Procedure: Peripheral Vascular Balloon Angioplasty;  Surgeon: Wellington Hampshire, MD;  Location: [facility];  Service: Cardiovascular;  Laterality: Right;  SFA   TEE WITHOUT CARDIOVERSION N/A 03/13/2016   Procedure: TRANSESOPHAGEAL ECHOCARDIOGRAM (TEE);  Surgeon: Ivin Poot, MD;  Location: [facility];  Service: Open Heart Surgery;  Laterality: N/A;   VSD REPAIR N/A 03/13/2016   Procedure: VENTRICULAR SEPTAL DEFECT (VSD) REPAIR;  Surgeon: Ivin Poot, MD;  Location: [facility];  Service: Open Heart Surgery;  Laterality: N/A;   FAMILY HISTORY Family History  Problem Relation Age of Onset   Diabetes Maternal Grandmother    Diabetes Mother    Aneurysm Mother    Peripheral Artery Disease Mother    Coronary artery disease Mother    Peptic Ulcer Father    Retinoblastoma Daughter    Colon cancer Neg Hx    Rectal cancer Neg Hx    SOCIAL HISTORY Social History   Tobacco Use   Smoking status: Former    Packs/day: 0.50    Types: Cigarettes    Quit date: 11/20/2015    Years since quitting: 5.4   Smokeless tobacco: Never   Tobacco comments:    quit 2018  Vaping Use    Vaping Use: Never used  Substance Use Topics   Alcohol use: No   Drug use: No       OPHTHALMIC EXAM: Base Eye Exam     Visual Acuity (Snellen - Linear)       Right Left   Dist [place] 20/30 -2 20/30 -2   Dist ph [place] 20/25 -2 NI         Tonometry (Tonopen, 9:31 AM)       Right Left   Pressure 06 08         Pupils       Dark Light Shape React APD   Right 2 1 Round Minimal 0   Left 2 1 Round Minimal 0         Visual Fields       Left Right    Full Full         Extraocular Movement       Right Left    Full Full         Neuro/Psych     Oriented x3: Yes   Mood/Affect: Normal         Dilation     Both eyes: 1.0% Mydriacyl, 2.5% Phenylephrine @ 9:32 AM           Slit Lamp and Fundus Exam     Slit Lamp Exam       Right Left   Lids/Lashes Dermatochalasis - upper lid, mild MGD Dermatochalasis - upper lid, mild MGD   Conjunctiva/Sclera White and quiet White and quiet   Cornea trace PEE trace PEE   Anterior Chamber Deep and quiet Deep and quiet   Iris Round and dilated, No NVI Round and dilated, No NVI   Lens PC IOL in good position PC IOL in good position   Vitreous Vitreous syneresis, vitreous condensations Vitreous syneresis, Posterior vitreous detachment, vitreous condensations         Fundus Exam       Right Left   Disc Pink and Sharp mild Pallor, Sharp rim, mild tilt   C/D Ratio 0.3 0.3   Macula Flat, Blunted foveal reflex, scatted Microaneurysms/DBH, scatterd Cystic changes/edema Blunted foveal reflex, central IRF/edema, +MA/DBH   Vessels mild attenuation, mild tortuousity, no  NV attenuated, Tortuous, mild Copper wiring   Periphery Attached, +MA/DBH greatest posteriorly    Attached, scattered MA/DBH greatest posteriorly              Refraction     Manifest Refraction       Sphere Cylinder Dist VA   Right +0.50 Sphere 20/25-2   Left +0.25 Sphere 20/30-           IMAGING AND PROCEDURES  Imaging and Procedures for  04/17/2021  OCT, Retina - OU - Both Eyes       Right Eye Quality was good. Central Foveal Thickness: 334. Progression has no prior data. Findings include abnormal foveal contour, intraretinal fluid, no SRF, intraretinal hyper-reflective material (+IRF greatest inferior macula).   Left Eye Quality was good. Central Foveal Thickness: 380. Progression has no prior data. Findings include abnormal foveal contour, intraretinal fluid, intraretinal hyper-reflective material, no SRF (+IRF greatest nasal fovea).   Notes *Images captured and stored on drive  Diagnosis / Impression:  +DME OU  Clinical management:  See below  Abbreviations: NFP - Normal foveal profile. CME - cystoid macular edema. PED - pigment epithelial detachment. IRF - intraretinal fluid. SRF - subretinal fluid. EZ - ellipsoid zone. ERM - epiretinal membrane. ORA - outer retinal atrophy. ORT - outer retinal tubulation. SRHM - subretinal hyper-reflective material. IRHM - intraretinal hyper-reflective material      Fluorescein Angiography Optos (Transit OD)       Right Eye Progression has no prior data. Early phase findings include microaneurysm. Mid/Late phase findings include microaneurysm, leakage (No NV).   Left Eye Progression has no prior data. Early phase findings include microaneurysm. Mid/Late phase findings include leakage, microaneurysm (No NV).   Notes **Images stored on drive**  Impression: Moderate NPDR OU Late leaking MA OU No NV OU      Intravitreal Injection, Pharmacologic Agent - OS - Left Eye       Time Out 04/17/2021. 10:40 AM. Confirmed correct patient, procedure, site, and patient consented.   Anesthesia Topical anesthesia was used. Anesthetic medications included Lidocaine 2%, Proparacaine 0.5%.   Procedure Preparation included 5% betadine to ocular surface, eyelid speculum. A supplied needle was used.   Injection: 1.25 mg Bevacizumab 1.81m/0.05ml   Route: Intravitreal, Site:  Left Eye   NDC:: 35456-256-38 Lot: [id number] , Expiration date: 05/24/2021, Waste: 0 mL   Post-op Post injection exam found visual acuity of at least counting fingers. The patient tolerated the procedure well. There were no complications. The patient received written and verbal post procedure care education.              ASSESSMENT/PLAN:    ICD-10-CM   1. Moderate nonproliferative diabetic retinopathy of both eyes with macular edema associated with type 2 diabetes mellitus ([name])  EL37.3428Intravitreal Injection, Pharmacologic Agent - OS - Left Eye    Bevacizumab (AVASTIN) SOLN 1.25 mg    2. Retinal edema  H35.81 OCT, Retina - OU - Both Eyes    3. Essential hypertension  I10     4. Hypertensive retinopathy of both eyes  H35.033 Fluorescein Angiography Optos (Transit OD)    5. Pseudophakia, both eyes  Z96.1       1,2. Moderate Non-proliferative diabetic retinopathy, both eyes - The incidence, risk factors for progression, natural history and treatment options for diabetic retinopathy were discussed with patient.   - The need for close monitoring of blood glucose, blood pressure, and serum lipids, avoiding cigarette or any type of tobacco, and the need  for long term follow up was also discussed with patient. - exam shows scattered MA, [place] OU; - FA (10.17.22) shows late leaking MA OU, no NV OU - OCT shows diabetic macular edema, both eyes  - The natural history, pathology, and characteristics of diabetic macular edema discussed with patient.  A generalized discussion of the major clinical trials concerning treatment of diabetic macular edema (ETDRS, DCT, SCORE, RISE / RIDE, and ongoing DRCR net studies) was completed.  This discussion included mention of the various approaches to treating diabetic macular edema (observation, laser photocoagulation, anti-VEGF injections with lucentis / Avastin / Eylea, steroid injections with Kenalog / Ozurdex, and intraocular surgery with vitrectomy).   The goal hemoglobin A1C of 6-7 was discussed, as well as importance of smoking cessation and hypertension control.  Need for ongoing treatment and monitoring were specifically discussed with reference to chronic nature of diabetic macular edema. - BCVA 20/25 OD, 20/30 OS - recommend IVA #1 OS today, 10.17.22 for DME -- will hold off on OD for now - pt wishes to proceed - RBA of procedure discussed, questions answered - informed consent obtained and signed - see procedure note - f/u in 4 wks -- DFE/OCT, possible injection(s)  3,4. Hypertensive retinopathy OU - discussed importance of tight BP control - monitor  5. Pseudophakia OU  - s/p CE/IOL  - IOL in good position, doing well  - monitor  Ophthalmic Meds Ordered this visit:  Meds ordered this encounter  Medications   Bevacizumab (AVASTIN) SOLN 1.25 mg     Return in about 4 weeks (around 05/15/2021) for f/u NPDR OU, DFE, OCT.  There are no Patient Instructions on file for this visit.  Explained the diagnoses, plan, and follow up with the patient and they expressed understanding.  Patient expressed understanding of the importance of proper follow up care.   This document serves as a record of services personally performed by Gardiner Sleeper, MD, PhD. It was created on their behalf by Leeann Must, [place], an ophthalmic technician. The creation of this record is the provider's dictation and/or activities during the visit.    Electronically signed by: Leeann Must, COA _0 @ 12:30 PM  This document serves as a record of services personally performed by Gardiner Sleeper, MD, PhD. It was created on their behalf by San Jetty. [place], OA an ophthalmic technician. The creation of this record is the provider's dictation and/or activities during the visit.    Electronically signed by: San Jetty. Owens Shark, [place] 10.17.2022 12:30 PM  Gardiner Sleeper, M.D., Ph.D. Diseases & Surgery of the Retina and Vitreous [facility]  I have reviewed the above documentation for accuracy and completeness, and I agree with the above. Gardiner Sleeper, M.D., Ph.D. 04/17/21 12:33 PM  Abbreviations: M myopia (nearsighted); A astigmatism; H hyperopia (farsighted); P presbyopia; Mrx spectacle prescription;  CTL contact lenses; OD right eye; OS left eye; OU both eyes  XT exotropia; ET esotropia; PEK punctate epithelial keratitis; PEE punctate epithelial erosions; DES dry eye syndrome; MGD meibomian gland dysfunction; ATs artificial tears; PFAT's preservative free artificial tears; [place] nuclear sclerotic cataract; PSC posterior subcapsular cataract; ERM epi-retinal membrane; PVD posterior vitreous detachment; RD retinal detachment; DM diabetes mellitus; DR diabetic retinopathy; NPDR non-proliferative diabetic retinopathy; PDR proliferative diabetic retinopathy; CSME clinically significant macular edema; DME diabetic macular edema; dbh dot blot hemorrhages; CWS cotton wool spot; POAG primary open angle glaucoma; C/D cup-to-disc ratio; HVF humphrey visual field; GVF goldmann visual field; OCT  optical coherence tomography; IOP intraocular pressure; BRVO Branch retinal vein occlusion; CRVO central retinal vein occlusion; CRAO central retinal artery occlusion; BRAO branch retinal artery occlusion; RT retinal tear; SB scleral buckle; PPV pars plana vitrectomy; VH Vitreous hemorrhage; PRP panretinal laser photocoagulation; IVK intravitreal kenalog; VMT vitreomacular traction; MH Macular hole;  NVD neovascularization of the disc; NVE neovascularization elsewhere; AREDS age related eye disease study; ARMD age related macular degeneration; POAG primary open angle glaucoma; EBMD epithelial/anterior basement membrane dystrophy; ACIOL anterior chamber intraocular lens; IOL intraocular lens; PCIOL posterior chamber intraocular lens; Phaco/IOL phacoemulsification with intraocular lens placement; [place] photorefractive keratectomy; LASIK laser assisted in situ  keratomileusis; HTN hypertension; DM diabetes mellitus; COPD chronic obstructive pulmonary disease

## 2021-04-17 ENCOUNTER — Encounter (INDEPENDENT_AMBULATORY_CARE_PROVIDER_SITE_OTHER): Payer: Self-pay | Admitting: Ophthalmology

## 2021-04-17 ENCOUNTER — Ambulatory Visit (INDEPENDENT_AMBULATORY_CARE_PROVIDER_SITE_OTHER): Payer: Medicare Other | Admitting: Ophthalmology

## 2021-04-17 ENCOUNTER — Other Ambulatory Visit: Payer: Self-pay

## 2021-04-17 DIAGNOSIS — Z961 Presence of intraocular lens: Secondary | ICD-10-CM | POA: Diagnosis not present

## 2021-04-17 DIAGNOSIS — H35033 Hypertensive retinopathy, bilateral: Secondary | ICD-10-CM | POA: Diagnosis not present

## 2021-04-17 DIAGNOSIS — E113313 Type 2 diabetes mellitus with moderate nonproliferative diabetic retinopathy with macular edema, bilateral: Secondary | ICD-10-CM

## 2021-04-17 DIAGNOSIS — I10 Essential (primary) hypertension: Secondary | ICD-10-CM

## 2021-04-17 DIAGNOSIS — H3581 Retinal edema: Secondary | ICD-10-CM

## 2021-04-17 MED ORDER — BEVACIZUMAB CHEMO INJECTION 1.25MG/0.05ML SYRINGE FOR KALEIDOSCOPE
1.2500 mg | INTRAVITREAL | Status: AC | PRN
  Administered 2021-04-17: 1.25 mg via INTRAVITREAL

## 2021-04-27 ENCOUNTER — Telehealth: Payer: Self-pay | Admitting: *Deleted

## 2021-04-27 DIAGNOSIS — Z006 Encounter for examination for normal comparison and control in clinical research program: Secondary | ICD-10-CM

## 2021-04-27 NOTE — Telephone Encounter (Signed)
I called patient to follow-up with [facility]. Patient never answered question on whether he would allow [facility] to follow up for up to 5 years. Daughter did not want to allow them to use social security number for follow-up. I thanked her for her time.

## 2021-05-08 ENCOUNTER — Ambulatory Visit (INDEPENDENT_AMBULATORY_CARE_PROVIDER_SITE_OTHER): Payer: Medicare Other

## 2021-05-08 ENCOUNTER — Other Ambulatory Visit: Payer: Self-pay | Admitting: Internal Medicine

## 2021-05-08 ENCOUNTER — Other Ambulatory Visit: Payer: Self-pay

## 2021-05-08 ENCOUNTER — Telehealth: Payer: Self-pay | Admitting: Internal Medicine

## 2021-05-08 DIAGNOSIS — I480 Paroxysmal atrial fibrillation: Secondary | ICD-10-CM

## 2021-05-08 DIAGNOSIS — I5022 Chronic systolic (congestive) heart failure: Secondary | ICD-10-CM

## 2021-05-08 DIAGNOSIS — Z5181 Encounter for therapeutic drug level monitoring: Secondary | ICD-10-CM | POA: Diagnosis not present

## 2021-05-08 LAB — POCT INR: INR: 2 (ref 2.0–3.0)

## 2021-05-08 MED ORDER — TORSEMIDE 20 MG PO TABS: ORAL_TABLET | ORAL | 1 refills | Status: DC

## 2021-05-08 NOTE — Telephone Encounter (Signed)
1.Medication Requested: torsemide (DEMADEX) 20 MG tablet  2. Pharmacy (Name, [facility]): [id number] - [place], [place] AT [facility] [address]  Phone:  [phone] Fax:  [phone]   3. On Med List: yes  4. Last Visit with PCP: 05.24.22  5. Next visit date with PCP: n/a   Agent: Please be advised that RX refills may take up to 3 business days. We ask that you follow-up with your pharmacy.

## 2021-05-08 NOTE — Patient Instructions (Signed)
Continue taking 1.5 tablets daily except for 1 tablet every Monday, Wednesday and Friday.  Recheck INR in 6 weeks.

## 2021-05-08 NOTE — Telephone Encounter (Signed)
1.Medication Requested: glucose blood (ACCU-CHEK AVIVA PLUS) test strip  2. Pharmacy (Name, [facility]): [id number] - [place], [address]    3. On Med List: yes  4. Last Visit with PCP: 05.24.22  5. Next visit date with PCP: n/a   Agent: Please be advised that RX refills may take up to 3 business days. We ask that you follow-up with your pharmacy.

## 2021-05-10 NOTE — Progress Notes (Signed)
[facility] Note  05/15/2021     CHIEF COMPLAINT Patient presents for Retina Follow Up   HISTORY OF PRESENT ILLNESS: William [name] is a 70 y.o. male who presents to the clinic today for:   HPI     Retina Follow Up   Patient presents with  Diabetic Retinopathy.  In both eyes.  This started 4 weeks ago.  I, the attending physician,  performed the HPI with the patient and updated documentation appropriately.        Comments   Patient here for 4 weeks retina follow up for NPDR OU. Patient states vision doing ok. No eye pain.       Last edited by [name], MD on 05/15/2021 11:23 PM.      Referring physician: Lisabeth Pick, MD [place],  [place] [address]  HISTORICAL INFORMATION:  Selected notes from the MEDICAL RECORD NUMBER Referred by Dr. [name] for eval of DME OU LEE:  Ocular Hx- PMH-    CURRENT MEDICATIONS: No current outpatient medications on file. (Ophthalmic Drugs)   No current facility-administered medications for this visit. (Ophthalmic Drugs)   Current Outpatient Medications (Other)  Medication Sig   Accu-Chek Softclix Lancets lancets 1 each by Other route 4 (four) times daily as needed for other. Use as instructed   acetaminophen (TYLENOL) 650 MG CR tablet Take 650 mg by mouth every 8 (eight) hours as needed for pain.   albuterol (PROVENTIL HFA;VENTOLIN HFA) 108 (90 Base) MCG/ACT inhaler Inhale 2 puffs into the lungs every 6 (six) hours as needed for wheezing or shortness of breath.   aspirin EC 81 MG tablet Take 1 tablet (81 mg total) by mouth daily.   Blood Glucose Monitoring Suppl (ACCU-CHEK AVIVA PLUS) w/Device KIT 1 each by Does not apply route 4 (four) times daily -  before meals and at bedtime.   carvedilol (COREG) 3.125 MG tablet TAKE 1 TABLET(3.125 MG) BY MOUTH TWICE DAILY WITH A MEAL   Continuous Blood Gluc Receiver (FREESTYLE LIBRE 2 READER) DEVI 1 Act by Does not apply route daily.   Continuous  Blood Gluc Sensor (FREESTYLE LIBRE 2 SENSOR) MISC 1 Act by Does not apply route daily.   dapagliflozin propanediol (FARXIGA) 10 MG TABS tablet Take 1 tablet (10 mg total) by mouth daily before breakfast.   Glucagon (GVOKE HYPOPEN 2-PACK) 1 MG/0.2ML SOAJ Inject 1 Act into the skin daily as needed.   glucose blood (ACCU-CHEK AVIVA PLUS) test strip TEST 3 TIMES DAILY PRIOR TO MEALS AND AT BEDTIME   insulin lispro (HUMALOG KWIKPEN) 100 UNIT/ML KwikPen ADMINISTER UP TO 10 UNITS UNDER THE SKIN THREE TIMES DAILY AS DIRECTED PER SLIDING SCALE   Insulin Pen Needle 31G X 8 MM MISC 1 each by Does not apply route 4 (four) times daily.   LANTUS SOLOSTAR 100 UNIT/ML Solostar Pen ADMINISTER 55 UNITS UNDER THE SKIN DAILY   pantoprazole (PROTONIX) 40 MG tablet TAKE 1 TABLET BY MOUTH TWICE DAILY BEFORE A MEAL AND 30 MINUTES BEFORE BREAKFAST AND SUPPER   pramipexole (MIRAPEX) 0.125 MG tablet TAKE 1 TABLET(0.125 MG) BY MOUTH AT BEDTIME   rosuvastatin (CRESTOR) 40 MG tablet TAKE 1 TABLET BY MOUTH EVERY DAY   torsemide (DEMADEX) 20 MG tablet TAKE 1 TABLET(20 MG) BY MOUTH 3 TIMES A WEEK   warfarin (COUMADIN) 5 MG tablet TAKE 1 TO 1 AND 1/2 TABLET BY MOUTH DAILY AS DIRECTED BY COUMADIN CLINIC   gabapentin (NEURONTIN) 300 MG capsule Take  1 capsule (300 mg total) by mouth 2 (two) times daily.   No current facility-administered medications for this visit. (Other)   REVIEW OF SYSTEMS: ROS   Positive for: Eyes Negative for: Constitutional, Gastrointestinal, Neurological, Skin, Genitourinary, Musculoskeletal, HENT, Endocrine, Cardiovascular, Respiratory, Psychiatric, Allergic/Imm, Heme/Lymph Last edited by [name], COA on 05/15/2021 10:00 AM.     ALLERGIES Allergies  Allergen Reactions   Morphine And Related Shortness Of Breath and Other (See Comments)    UNSPECIFIED REACTION "Pt said it was too much"    Latex Rash   PAST MEDICAL HISTORY Past Medical History:  Diagnosis Date   AKI (acute kidney  injury) ([facility])    With STEMI in 0315   Chronic systolic CHF (congestive heart failure) ([facility] [place])    Diabetes mellitus without complication ([facility] [place])    Hx of adenomatous colonic polyps 04/07/2018   Hyperlipidemia    Hypertension    Paroxysmal atrial fibrillation ([name])    STEMI (ST elevation myocardial infarction) ([place]) 2017   Past Surgical History:  Procedure Laterality Date   ABDOMINAL AORTOGRAM W/LOWER EXTREMITY N/A 09/19/2016   Procedure: Abdominal Aortogram w/Lower Extremity;  Surgeon: Wellington Hampshire, MD;  Location: [facility];  Service: Cardiovascular;  Laterality: N/A;   AMPUTATION Right 03/23/2016   Procedure: 1st and 2nd Ray Amputation Right Foot;  Surgeon: Newt Minion, MD;  Location: [facility];  Service: Orthopedics;  Laterality: Right;   AMPUTATION Right 06/21/2016   Procedure: RIGHT TRANSMETATARSAL AMPUTATION;  Surgeon: Newt Minion, MD;  Location: [employer] [facility];  Service: Orthopedics;  Laterality: Right;   CARDIAC CATHETERIZATION N/A 03/13/2016   Procedure: Right/Left Heart Cath and Coronary Angiography;  Surgeon: Sherren Mocha, MD;  Location: [facility];  Service: Cardiovascular;  Laterality: N/A;   CARDIAC CATHETERIZATION N/A 03/13/2016   Procedure: IABP Insertion;  Surgeon: Sherren Mocha, MD;  Location: [facility];  Service: Cardiovascular;  Laterality: N/A;   CATARACT EXTRACTION, BILATERAL     CERVICAL FUSION  1982, 1992   has had 3 neck surgeries from breaking his neck   CORONARY ARTERY BYPASS GRAFT N/A 03/13/2016   Procedure: CORONARY ARTERY BYPASS GRAFTING (CABG) x 1 (SVG to OM) with EVH from [place];  Surgeon: Ivin Poot, MD;  Location: [name];  Service: Open Heart Surgery;  Laterality: N/A;   LOWER EXTREMITY ANGIOGRAM  05/02/2016   Procedure: Lower Extremity Angiogram;  Surgeon: Wellington Hampshire, MD;  Location: [facility];  Service: Cardiovascular;;  Limited left femoral runoff right femoral runoff   PERIPHERAL VASCULAR  CATHETERIZATION N/A 05/02/2016   Procedure: Abdominal Aortogram;  Surgeon: Wellington Hampshire, MD;  Location: [facility];  Service: Cardiovascular;  Laterality: N/A;   PERIPHERAL VASCULAR CATHETERIZATION Right 05/02/2016   Procedure: Peripheral Vascular Balloon Angioplasty;  Surgeon: Wellington Hampshire, MD;  Location: [facility];  Service: Cardiovascular;  Laterality: Right;  SFA   TEE WITHOUT CARDIOVERSION N/A 03/13/2016   Procedure: TRANSESOPHAGEAL ECHOCARDIOGRAM (TEE);  Surgeon: Ivin Poot, MD;  Location: [facility];  Service: Open Heart Surgery;  Laterality: N/A;   VSD REPAIR N/A 03/13/2016   Procedure: VENTRICULAR SEPTAL DEFECT (VSD) REPAIR;  Surgeon: Ivin Poot, MD;  Location: [facility];  Service: Open Heart Surgery;  Laterality: N/A;   FAMILY HISTORY Family History  Problem Relation Age of Onset   Diabetes Maternal Grandmother    Diabetes Mother    Aneurysm Mother    Peripheral Artery Disease Mother  Coronary artery disease Mother    Peptic Ulcer Father    Retinoblastoma Daughter    Colon cancer Neg Hx    Rectal cancer Neg Hx    SOCIAL HISTORY Social History   Tobacco Use   Smoking status: Former    Packs/day: 0.50    Types: Cigarettes    Quit date: 11/20/2015    Years since quitting: 5.4   Smokeless tobacco: Never   Tobacco comments:    quit 2018  Vaping Use   Vaping Use: Never used  Substance Use Topics   Alcohol use: No   Drug use: No       OPHTHALMIC EXAM: Base Eye Exam     Visual Acuity (Snellen - Linear)       Right Left   Dist [place] 20/40 -2 20/25 -2   Dist ph [place] 20/25 -1 NI         Tonometry (Tonopen, 9:57 AM)       Right Left   Pressure 15 13         Pupils       Dark Light Shape React APD   Right 2 1 Round Minimal None   Left 2 1 Round Minimal Trace         Visual Fields (Counting fingers)       Left Right    Full Full         Extraocular Movement       Right Left    Full, Ortho Full, Ortho          Neuro/Psych     Oriented x3: Yes   Mood/Affect: Normal         Dilation     Both eyes: 1.0% Mydriacyl, 2.5% Phenylephrine @ 9:57 AM           Slit Lamp and Fundus Exam     Slit Lamp Exam       Right Left   Lids/Lashes Dermatochalasis - upper lid, mild MGD Dermatochalasis - upper lid, mild MGD   Conjunctiva/Sclera White and quiet White and quiet   Cornea trace PEE trace PEE   Anterior Chamber Deep and quiet Deep and quiet   Iris Round and dilated, No NVI Round and dilated, No NVI   Lens PC IOL in good position PC IOL in good position   Vitreous Vitreous syneresis, vitreous condensations Vitreous syneresis, Posterior vitreous detachment, vitreous condensations         Fundus Exam       Right Left   Disc Pink and Sharp mild Pallor, Sharp rim, mild tilt   C/D Ratio 0.3 0.3   Macula Flat, Blunted foveal reflex, scatted Microaneurysms/DBH, scatterd Cystic changes/edema greatest superior mac Blunted foveal reflex, central IRF/edema, +MA/DBH   Vessels mild attenuation, mild tortuousity, no NV attenuated, Tortuous, mild Copper wiring   Periphery Attached, +MA/DBH greatest posteriorly    Attached, scattered MA/DBH greatest posteriorly              IMAGING AND PROCEDURES  Imaging and Procedures for 05/15/2021  OCT, Retina - OU - Both Eyes       Right Eye Quality was good. Central Foveal Thickness: 339. Progression has been stable. Findings include abnormal foveal contour, intraretinal fluid, no SRF, intraretinal hyper-reflective material (Persistent IRF/IRHM ).   Left Eye Quality was good. Central Foveal Thickness: 373. Progression has been stable. Findings include abnormal foveal contour, intraretinal fluid, intraretinal hyper-reflective material, no SRF (Persistent IRF greatest nasal fovea).   Notes *Images  captured and stored on drive  Diagnosis / Impression:  +DME OU OD: Persistent IRF/IRHM  OS: Persistent IRF greatest nasal fovea  Clinical management:   See below  Abbreviations: NFP - Normal foveal profile. CME - cystoid macular edema. PED - pigment epithelial detachment. IRF - intraretinal fluid. SRF - subretinal fluid. EZ - ellipsoid zone. ERM - epiretinal membrane. ORA - outer retinal atrophy. ORT - outer retinal tubulation. SRHM - subretinal hyper-reflective material. IRHM - intraretinal hyper-reflective material      Intravitreal Injection, Pharmacologic Agent - OD - Right Eye       Time Out 05/15/2021. 10:54 AM. Confirmed correct patient, procedure, site, and patient consented.   Anesthesia Topical anesthesia was used. Anesthetic medications included Lidocaine 2%, Proparacaine 0.5%.   Procedure Preparation included 5% betadine to ocular surface, eyelid speculum. A supplied needle was used.   Injection: 1.25 mg Bevacizumab 1.34m/0.05ml   Route: Intravitreal, Site: Right Eye   NDC:: 40086-761-95 Lot: [id number] , Expiration date: 07/11/2021, Waste: 0 mL   Post-op Post injection exam found visual acuity of at least counting fingers. The patient tolerated the procedure well. There were no complications. The patient received written and verbal post procedure care education. Post injection medications were not given.      Intravitreal Injection, Pharmacologic Agent - OS - Left Eye       Time Out 05/15/2021. 10:54 AM. Confirmed correct patient, procedure, site, and patient consented.   Anesthesia Topical anesthesia was used. Anesthetic medications included Lidocaine 2%, Proparacaine 0.5%.   Procedure Preparation included 5% betadine to ocular surface, eyelid speculum. A supplied needle was used.   Injection: 1.25 mg Bevacizumab 1.248m0.05ml   Route: Intravitreal, Site: Left Eye   NDC: : 09326-712-45Lot: [id number] , Expiration date: 07/02/2021, Waste: 0 mL   Post-op Post injection exam found visual acuity of at least counting fingers. The patient tolerated the procedure well. There were no complications. The patient  received written and verbal post procedure care education. Post injection medications were not given.            ASSESSMENT/PLAN:    ICD-10-CM   1. Moderate nonproliferative diabetic retinopathy of both eyes with macular edema associated with type 2 diabetes mellitus ([name])  [id number] Injection, Pharmacologic Agent - OD - Right Eye    Intravitreal Injection, Pharmacologic Agent - OS - Left Eye    Bevacizumab (AVASTIN) SOLN 1.25 mg    Bevacizumab (AVASTIN) SOLN 1.25 mg    2. Retinal edema  H35.81 OCT, Retina - OU - Both Eyes    3. Essential hypertension  I10     4. Hypertensive retinopathy of both eyes  H35.033     5. Pseudophakia, both eyes  Z96.1      1,2. Moderate Non-proliferative diabetic retinopathy, both eyes - s/p IVA OS #1 (10.17.22) - exam shows scattered MA, IRH OU; - FA (10.17.22) shows late leaking MA OU, no NV OU - OCT shows OD: Persistent IRF/IRHM ; OS: Persistent IRF greatest nasal fovea - BCVA 20/25 OD, 20/30 OS - recommend IVA OU (OD #1 and OS #2) today, 11.14.22 for DME  - pt wishes to proceed - RBA of procedure discussed, questions answered - informed consent obtained and signed - see procedure note - f/u in 4 wks -- DFE/OCT, possible injection(s)  3,4. Hypertensive retinopathy OU - discussed importance of tight BP control - monitor  5. Pseudophakia OU  - s/p CE/IOL  - IOL in good position, doing well  - monitor  Ophthalmic Meds Ordered  this visit:  Meds ordered this encounter  Medications   Bevacizumab (AVASTIN) SOLN 1.25 mg   Bevacizumab (AVASTIN) SOLN 1.25 mg      Return in about 4 weeks (around 06/12/2021) for f/u NPDR OU, DFE, OCT.  There are no Patient Instructions on file for this visit.  Explained the diagnoses, plan, and follow up with the patient and they expressed understanding.  Patient expressed understanding of the importance of proper follow up care.   This document serves as a record of services personally  performed by Gardiner Sleeper, MD, PhD. It was created on their behalf by Roselee Nova, COMT. The creation of this record is the provider's dictation and/or activities during the visit.  Electronically signed by: Roselee Nova, COMT 05/15/21 11:27 PM  This document serves as a record of services personally performed by Gardiner Sleeper, MD, PhD. It was created on their behalf by San Jetty. Owens Shark, OA an ophthalmic technician. The creation of this record is the provider's dictation and/or activities during the visit.    Electronically signed by: San Jetty. Owens Shark, [place] 11.14.2022 11:27 PM  Gardiner Sleeper, M.D., Ph.D. Diseases & Surgery of the Retina and Vitreous [facility]  I have reviewed the above documentation for accuracy and completeness, and I agree with the above. Gardiner Sleeper, M.D., Ph.D. 05/15/21 11:28 PM  Abbreviations: M myopia (nearsighted); A astigmatism; H hyperopia (farsighted); P presbyopia; Mrx spectacle prescription;  CTL contact lenses; OD right eye; OS left eye; OU both eyes  XT exotropia; ET esotropia; PEK punctate epithelial keratitis; PEE punctate epithelial erosions; DES dry eye syndrome; MGD meibomian gland dysfunction; ATs artificial tears; PFAT's preservative free artificial tears; [place] nuclear sclerotic cataract; PSC posterior subcapsular cataract; ERM epi-retinal membrane; PVD posterior vitreous detachment; RD retinal detachment; DM diabetes mellitus; DR diabetic retinopathy; NPDR non-proliferative diabetic retinopathy; PDR proliferative diabetic retinopathy; CSME clinically significant macular edema; DME diabetic macular edema; dbh dot blot hemorrhages; CWS cotton wool spot; POAG primary open angle glaucoma; C/D cup-to-disc ratio; HVF humphrey visual field; GVF goldmann visual field; OCT optical coherence tomography; IOP intraocular pressure; BRVO Branch retinal vein occlusion; CRVO central retinal vein occlusion; CRAO central retinal artery occlusion;  BRAO branch retinal artery occlusion; RT retinal tear; SB scleral buckle; PPV pars plana vitrectomy; VH Vitreous hemorrhage; PRP panretinal laser photocoagulation; IVK intravitreal kenalog; VMT vitreomacular traction; MH Macular hole;  NVD neovascularization of the disc; NVE neovascularization elsewhere; AREDS age related eye disease study; ARMD age related macular degeneration; POAG primary open angle glaucoma; EBMD epithelial/anterior basement membrane dystrophy; ACIOL anterior chamber intraocular lens; IOL intraocular lens; PCIOL posterior chamber intraocular lens; Phaco/IOL phacoemulsification with intraocular lens placement; [place] photorefractive keratectomy; LASIK laser assisted in situ keratomileusis; HTN hypertension; DM diabetes mellitus; COPD chronic obstructive pulmonary disease

## 2021-05-15 ENCOUNTER — Ambulatory Visit (INDEPENDENT_AMBULATORY_CARE_PROVIDER_SITE_OTHER): Payer: Medicare Other | Admitting: Ophthalmology

## 2021-05-15 ENCOUNTER — Other Ambulatory Visit: Payer: Self-pay

## 2021-05-15 ENCOUNTER — Encounter (INDEPENDENT_AMBULATORY_CARE_PROVIDER_SITE_OTHER): Payer: Self-pay | Admitting: Ophthalmology

## 2021-05-15 DIAGNOSIS — E113313 Type 2 diabetes mellitus with moderate nonproliferative diabetic retinopathy with macular edema, bilateral: Secondary | ICD-10-CM | POA: Diagnosis not present

## 2021-05-15 DIAGNOSIS — H3581 Retinal edema: Secondary | ICD-10-CM

## 2021-05-15 DIAGNOSIS — I10 Essential (primary) hypertension: Secondary | ICD-10-CM | POA: Diagnosis not present

## 2021-05-15 DIAGNOSIS — Z961 Presence of intraocular lens: Secondary | ICD-10-CM

## 2021-05-15 DIAGNOSIS — H35033 Hypertensive retinopathy, bilateral: Secondary | ICD-10-CM

## 2021-05-15 MED ORDER — BEVACIZUMAB CHEMO INJECTION 1.25MG/0.05ML SYRINGE FOR KALEIDOSCOPE
1.2500 mg | INTRAVITREAL | Status: AC | PRN
  Administered 2021-05-15: 1.25 mg via INTRAVITREAL

## 2021-05-22 ENCOUNTER — Telehealth: Payer: Self-pay | Admitting: Internal Medicine

## 2021-05-22 ENCOUNTER — Other Ambulatory Visit: Payer: Self-pay | Admitting: Internal Medicine

## 2021-05-22 DIAGNOSIS — E1152 Type 2 diabetes mellitus with diabetic peripheral angiopathy with gangrene: Secondary | ICD-10-CM

## 2021-05-22 MED ORDER — GABAPENTIN 300 MG PO CAPS: 300.0000 mg | ORAL_CAPSULE | Freq: Two times a day (BID) | ORAL | 1 refills | Status: DC

## 2021-05-22 NOTE — Telephone Encounter (Signed)
1.Medication Requested: glucose blood (ACCU-CHEK AVIVA PLUS) test strip  2. Pharmacy (Name, [facility]): [facility] [phone] - [place], [address]  3. On Med List: yes   4. Last Visit with PCP: 11-22-2020  5. Next visit date with PCP: n/a   Patient's daughter [name] requesting a new rx prescribed by current provider  Caller states patient has been w/o strips for 1 1/2 wks

## 2021-05-22 NOTE — Telephone Encounter (Signed)
1.Medication Requested: gabapentin (NEURONTIN) 300 MG capsule (Expired)  2. Pharmacy (Name, [facility]): [facility] [phone] - [place], [address] AT [address]   3. On Med List: yes   4. Last Visit with PCP: 11-22-2020  5. Next visit date with PCP: n/a  Patient's daughter [name] requesting a new rx prescribed by current provider

## 2021-05-22 NOTE — Telephone Encounter (Signed)
1.Medication Requested: dapagliflozin propanediol (FARXIGA) 10 MG TABS tablet  2. Pharmacy (Name, [facility]): [facility] [phone] - [place], [address]    3. On Med List: yes  4. Last Visit with PCP: 11-22-2020  5. Next visit date with PCP: n/a   Agent: Please be advised that RX refills may take up to 3 business days. We ask that you follow-up with your pharmacy.

## 2021-05-25 ENCOUNTER — Other Ambulatory Visit: Payer: Self-pay | Admitting: Internal Medicine

## 2021-05-25 DIAGNOSIS — Z794 Long term (current) use of insulin: Secondary | ICD-10-CM

## 2021-05-25 DIAGNOSIS — N1832 Chronic kidney disease, stage 3b: Secondary | ICD-10-CM

## 2021-05-25 DIAGNOSIS — E118 Type 2 diabetes mellitus with unspecified complications: Secondary | ICD-10-CM

## 2021-05-25 DIAGNOSIS — E1122 Type 2 diabetes mellitus with diabetic chronic kidney disease: Secondary | ICD-10-CM

## 2021-05-29 ENCOUNTER — Other Ambulatory Visit: Payer: Self-pay | Admitting: Internal Medicine

## 2021-05-29 DIAGNOSIS — Z794 Long term (current) use of insulin: Secondary | ICD-10-CM

## 2021-05-30 ENCOUNTER — Other Ambulatory Visit: Payer: Self-pay | Admitting: Nurse Practitioner

## 2021-06-05 ENCOUNTER — Telehealth: Payer: Self-pay | Admitting: Internal Medicine

## 2021-06-05 ENCOUNTER — Other Ambulatory Visit: Payer: Self-pay | Admitting: Internal Medicine

## 2021-06-05 DIAGNOSIS — I10 Essential (primary) hypertension: Secondary | ICD-10-CM

## 2021-06-05 DIAGNOSIS — E118 Type 2 diabetes mellitus with unspecified complications: Secondary | ICD-10-CM

## 2021-06-05 DIAGNOSIS — Z794 Long term (current) use of insulin: Secondary | ICD-10-CM

## 2021-06-05 DIAGNOSIS — N1832 Chronic kidney disease, stage 3b: Secondary | ICD-10-CM

## 2021-06-05 DIAGNOSIS — I5022 Chronic systolic (congestive) heart failure: Secondary | ICD-10-CM

## 2021-06-05 DIAGNOSIS — I2510 Atherosclerotic heart disease of native coronary artery without angina pectoris: Secondary | ICD-10-CM

## 2021-06-05 DIAGNOSIS — E1122 Type 2 diabetes mellitus with diabetic chronic kidney disease: Secondary | ICD-10-CM

## 2021-06-05 DIAGNOSIS — G2581 Restless legs syndrome: Secondary | ICD-10-CM

## 2021-06-05 MED ORDER — DAPAGLIFLOZIN PROPANEDIOL 10 MG PO TABS: 10.0000 mg | ORAL_TABLET | Freq: Every day | ORAL | 0 refills | Status: DC

## 2021-06-05 MED ORDER — PRAMIPEXOLE DIHYDROCHLORIDE 0.125 MG PO TABS: ORAL_TABLET | ORAL | 0 refills | Status: DC

## 2021-06-05 MED ORDER — CARVEDILOL 3.125 MG PO TABS: 3.1250 mg | ORAL_TABLET | Freq: Two times a day (BID) | ORAL | 0 refills | Status: DC

## 2021-06-05 NOTE — Telephone Encounter (Signed)
1.Medication Requested: pramipexole (MIRAPEX) 0.125 MG tablet carvedilol (COREG) 3.125 MG tablet dapagliflozin propanediol (FARXIGA) 10 MG TABS tablet   2. Pharmacy (Name, [facility]): [facility] #[id number] - [place], [address] AT [facility] [address]  Phone:  [phone] Fax:  [phone]   3. On Med List: yes  4. Last Visit with PCP: 05.24.22  5. Next visit date with PCP: 01.09.23   Agent: Please be advised that RX refills may take up to 3 business days. We ask that you follow-up with your pharmacy.

## 2021-06-06 ENCOUNTER — Encounter: Payer: Self-pay | Admitting: Internal Medicine

## 2021-06-06 ENCOUNTER — Other Ambulatory Visit: Payer: Self-pay

## 2021-06-06 ENCOUNTER — Ambulatory Visit (INDEPENDENT_AMBULATORY_CARE_PROVIDER_SITE_OTHER): Payer: Medicare Other | Admitting: Internal Medicine

## 2021-06-06 VITALS — BP 108/60 | HR 59 | Temp 98.1°F | Ht 65.0 in | Wt 181.0 lb

## 2021-06-06 DIAGNOSIS — L84 Corns and callosities: Secondary | ICD-10-CM | POA: Diagnosis not present

## 2021-06-06 NOTE — Patient Instructions (Signed)
   A referral was ordered for for [facility].       Someone from their office will call you to schedule an appointment.

## 2021-06-06 NOTE — Progress Notes (Signed)
Subjective:    Patient ID: William [name], male    DOB: [birth year], 70 y.o.   MRN: [id number]  This visit occurred during the SARS-CoV-2 public health emergency.  Safety protocols were in place, including screening questions prior to the visit, additional usage of staff PPE, and extensive cleaning of exam room while observing appropriate contact time as indicated for disinfecting solutions.    HPI The patient is here for an acute visit.  He has a new callus in his left foot. He thinks he may have stepped on something.  The kids in the house often leaves toys on the ground and he thinks that is probably what happened.  It is very tender.  He has been soaking it.   A while ago he had a callus on the side of his toe on the right foot.  He did see a podiatrist at that time who dug on it a lot and it ended up getting infected and ended up having a partial foot infection.    Medications and allergies reviewed with patient and updated if appropriate.  Patient Active Problem List   Diagnosis Date Noted   Encounter for general adult medical examination with abnormal findings 11/28/2020   Thrombocytopenia ([place]) 11/22/2020   Deficiency anemia 11/22/2020   Insulin-requiring or dependent type II diabetes mellitus ([place]) 11/22/2020   Hyperlipidemia LDL goal <70 11/22/2020   Tobacco dependence 03/24/2017   Type 2 diabetes mellitus with stage 3b chronic kidney disease, without long-term current use of insulin ([place]) 09/17/2016   Peripheral vascular disorder ([place]) 09/17/2016   CKD (chronic kidney disease) stage 3, GFR 30-59 ml/min ([name])    Chronic systolic HF (heart failure) ([place]) 05/28/2016   Coronary artery disease due to lipid rich plaque 03/16/2016   VSD (ventricular septal defect) 03/16/2016   Paroxysmal atrial fibrillation ([place]) 03/15/2016   Erectile dysfunction associated with type 2 diabetes mellitus ([place]) 04/07/2012   Mood disorder ([place]) 03/12/2011   Essential hypertension 03/17/2010    GERD 11/01/2008   Type 2 diabetes mellitus with diabetic peripheral angiopathy and gangrene, with long-term current use of insulin ([place]) 09/23/2008    Current Outpatient Medications on File Prior to Visit  Medication Sig Dispense Refill   ACCU-CHEK AVIVA PLUS test strip TEST 3 TIMES DAILY PRIOR TO MEALS AND AT BEDTIME 100 strip 11   Accu-Chek Softclix Lancets lancets 1 each by Other route 4 (four) times daily as needed for other. Use as instructed 200 each 5   acetaminophen (TYLENOL) 650 MG CR tablet Take 650 mg by mouth every 8 (eight) hours as needed for pain.     albuterol (PROVENTIL HFA;VENTOLIN HFA) 108 (90 Base) MCG/ACT inhaler Inhale 2 puffs into the lungs every 6 (six) hours as needed for wheezing or shortness of breath. 1 Inhaler 0   aspirin EC 81 MG tablet Take 1 tablet (81 mg total) by mouth daily. 30 tablet 3   Blood Glucose Monitoring Suppl (ACCU-CHEK AVIVA PLUS) w/Device KIT 1 each by Does not apply route 4 (four) times daily -  before meals and at bedtime. 1 kit 0   carvedilol (COREG) 3.125 MG tablet Take 1 tablet (3.125 mg total) by mouth 2 (two) times daily with a meal. 180 tablet 0   Continuous Blood Gluc Receiver (FREESTYLE LIBRE 2 READER) DEVI 1 Act by Does not apply route daily. 2 each 5   Continuous Blood Gluc Sensor (FREESTYLE LIBRE 2 SENSOR) MISC 1 Act by Does not apply route daily. 2 each  5   dapagliflozin propanediol (FARXIGA) 10 MG TABS tablet Take 1 tablet (10 mg total) by mouth daily before breakfast. 90 tablet 0   gabapentin (NEURONTIN) 300 MG capsule Take 1 capsule (300 mg total) by mouth 2 (two) times daily. 180 capsule 1   Glucagon (GVOKE HYPOPEN 2-PACK) 1 MG/0.2ML SOAJ Inject 1 Act into the skin daily as needed. 2 mL 5   insulin lispro (HUMALOG KWIKPEN) 100 UNIT/ML KwikPen ADMINISTER UP TO 10 UNITS UNDER THE SKIN THREE TIMES DAILY AS DIRECTED PER SLIDING SCALE 27 mL 3   Insulin Pen Needle 31G X 8 MM MISC 1 each by Does not apply route 4 (four) times daily. 100  each 12   LANTUS SOLOSTAR 100 UNIT/ML Solostar Pen ADMINISTER 55 UNITS UNDER THE SKIN DAILY 51 mL 0   pantoprazole (PROTONIX) 40 MG tablet TAKE 1 TABLET BY MOUTH TWICE DAILY BEFORE A MEAL AND 30 MINUTES BEFORE BREAKFAST AND SUPPER 180 tablet 1   pramipexole (MIRAPEX) 0.125 MG tablet TAKE 1 TABLET(0.125 MG) BY MOUTH AT BEDTIME 90 tablet 0   rosuvastatin (CRESTOR) 40 MG tablet TAKE 1 TABLET BY MOUTH EVERY DAY 90 tablet 3   torsemide (DEMADEX) 20 MG tablet TAKE 1 TABLET(20 MG) BY MOUTH 3 TIMES A WEEK 38 tablet 0   warfarin (COUMADIN) 5 MG tablet TAKE 1 TO 1 AND 1/2 TABLET BY MOUTH DAILY AS DIRECTED BY COUMADIN CLINIC 180 tablet 1   No current facility-administered medications on file prior to visit.    Past Medical History:  Diagnosis Date   AKI (acute kidney injury) ([place])    With STEMI in 0160   Chronic systolic CHF (congestive heart failure) ([place])    Diabetes mellitus without complication ([name])    Hx of adenomatous colonic polyps 04/07/2018   Hyperlipidemia    Hypertension    Paroxysmal atrial fibrillation ([name])    STEMI (ST elevation myocardial infarction) ([place]) 2017    Past Surgical History:  Procedure Laterality Date   ABDOMINAL AORTOGRAM W/LOWER EXTREMITY N/A 09/19/2016   Procedure: Abdominal Aortogram w/Lower Extremity;  Surgeon: Wellington Hampshire, MD;  Location: [facility];  Service: Cardiovascular;  Laterality: N/A;   AMPUTATION Right 03/23/2016   Procedure: 1st and 2nd Ray Amputation Right Foot;  Surgeon: Newt Minion, MD;  Location: [facility];  Service: Orthopedics;  Laterality: Right;   AMPUTATION Right 06/21/2016   Procedure: RIGHT TRANSMETATARSAL AMPUTATION;  Surgeon: Newt Minion, MD;  Location: [facility];  Service: Orthopedics;  Laterality: Right;   CARDIAC CATHETERIZATION N/A 03/13/2016   Procedure: Right/Left Heart Cath and Coronary Angiography;  Surgeon: Sherren Mocha, MD;  Location: [facility];  Service: Cardiovascular;  Laterality: N/A;   CARDIAC  CATHETERIZATION N/A 03/13/2016   Procedure: IABP Insertion;  Surgeon: Sherren Mocha, MD;  Location: [facility];  Service: Cardiovascular;  Laterality: N/A;   CATARACT EXTRACTION, BILATERAL     CERVICAL FUSION  1982, 1992   has had 3 neck surgeries from breaking his neck   CORONARY ARTERY BYPASS GRAFT N/A 03/13/2016   Procedure: CORONARY ARTERY BYPASS GRAFTING (CABG) x 1 (SVG to OM) with EVH from [facility];  Surgeon: Ivin Poot, MD;  Location: [facility];  Service: Open Heart Surgery;  Laterality: N/A;   LOWER EXTREMITY ANGIOGRAM  05/02/2016   Procedure: Lower Extremity Angiogram;  Surgeon: Wellington Hampshire, MD;  Location: [facility];  Service: Cardiovascular;;  Limited left femoral runoff right femoral runoff  PERIPHERAL VASCULAR CATHETERIZATION N/A 05/02/2016   Procedure: Abdominal Aortogram;  Surgeon: Wellington Hampshire, MD;  Location: [facility];  Service: Cardiovascular;  Laterality: N/A;   PERIPHERAL VASCULAR CATHETERIZATION Right 05/02/2016   Procedure: Peripheral Vascular Balloon Angioplasty;  Surgeon: Wellington Hampshire, MD;  Location: [facility];  Service: Cardiovascular;  Laterality: Right;  SFA   TEE WITHOUT CARDIOVERSION N/A 03/13/2016   Procedure: TRANSESOPHAGEAL ECHOCARDIOGRAM (TEE);  Surgeon: Ivin Poot, MD;  Location: [facility];  Service: Open Heart Surgery;  Laterality: N/A;   VSD REPAIR N/A 03/13/2016   Procedure: VENTRICULAR SEPTAL DEFECT (VSD) REPAIR;  Surgeon: Ivin Poot, MD;  Location: [facility];  Service: Open Heart Surgery;  Laterality: N/A;    Social History   Socioeconomic History   Marital status: Married    Spouse name: Mickey   Number of children: Not on file   Years of education: Not on file   Highest education level: Not on file  Occupational History   Not on file  Tobacco Use   Smoking status: Former    Packs/day: 0.50    Types: Cigarettes    Quit date: 11/20/2015    Years since quitting: 5.5   Smokeless  tobacco: Never   Tobacco comments:    quit 2018  Vaping Use   Vaping Use: Never used  Substance and Sexual Activity   Alcohol use: No   Drug use: No   Sexual activity: Not on file  Other Topics Concern   Not on file  Social History Narrative   He is married he is from [place] and 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  He is disabled from a neck fracture in 1994.  1 son and 2 daughters.      He is illiterate.      No alcohol or caffeine or drug use or other tobacco he is a former smoker.   Social Determinants of Health   Financial Resource Strain: Not on file  Food Insecurity: Not on file  Transportation Needs: Not on file  Physical Activity: Not on file  Stress: Not on file  Social Connections: Not on file    Family History  Problem Relation Age of Onset   Diabetes Maternal Grandmother    Diabetes Mother    Aneurysm Mother    Peripheral Artery Disease Mother    Coronary artery disease Mother    Peptic Ulcer Father    Retinoblastoma Daughter    Colon cancer Neg Hx    Rectal cancer Neg Hx     Review of Systems     Objective:   Vitals:   06/06/21 1551  BP: 108/60  Pulse: (!) 59  Temp: 98.1 F (36.7 C)  SpO2: 98%   BP Readings from Last 3 Encounters:  06/06/21 108/60  12/27/20 (!) 110/58  11/22/20 134/60   Wt Readings from Last 3 Encounters:  06/06/21 181 lb (82.1 kg)  12/27/20 181 lb (82.1 kg)  11/22/20 181 lb (82.1 kg)   Body mass index is 30.12 kg/m.   Physical Exam Constitutional:      General: He is not in acute distress.    Appearance: Normal appearance. He is not ill-appearing.  HENT:     Head: Normocephalic and atraumatic.  Skin:    General: Skin is warm and dry.     Comments: Left plantar midfoot skin lump with slight callus formation it is tender to touch.  No open wound/drainage.  He also has callus formation on lateral aspect of foot.  Neurological:     Mental Status: He is alert.      Sensory: Sensory deficit (Left foot) present.           Assessment & Plan:    Callus left foot: Acute Looks to be Posttraumatic-stepped on something He is a diabetic and has peripheral neuropathy Area is very tender, but does not look infected, so will hold off on an antibiotic Needs to see Ortho immediately-Dr. Diana Eves is going to do his previous foot surgery  Advised him to monitor the foot very closely-if there is any concerning changes or evidence of infection advised him to call immediately

## 2021-06-08 NOTE — Progress Notes (Signed)
[facility] Note  06/12/2021     CHIEF COMPLAINT Patient presents for Retina Follow Up   HISTORY OF PRESENT ILLNESS: William [name] is a 70 y.o. male who presents to the clinic today for:   HPI     Retina Follow Up   Patient presents with  Diabetic Retinopathy.  In both eyes.  Severity is moderate.  Duration of 4 weeks.  Since onset it is stable.  I, the attending physician,  performed the HPI with the patient and updated documentation appropriately.        Comments   Patient states vision the same OU. BS was 181 this am. Last a1c was 8.2, checked 6 months ago.       Last edited by [name], MD on 06/12/2021  8:06 AM.    Pt states BG up and down.  Referring physician: [name], MD [place],  [place] [id number]  HISTORICAL INFORMATION:  Selected notes from the MEDICAL RECORD NUMBER Referred by Dr. [name] for eval of DME OU LEE:  Ocular Hx- PMH-    CURRENT MEDICATIONS: No current outpatient medications on file. (Ophthalmic Drugs)   No current facility-administered medications for this visit. (Ophthalmic Drugs)   Current Outpatient Medications (Other)  Medication Sig   ACCU-CHEK AVIVA PLUS test strip TEST 3 TIMES DAILY PRIOR TO MEALS AND AT BEDTIME   Accu-Chek Softclix Lancets lancets 1 each by Other route 4 (four) times daily as needed for other. Use as instructed   acetaminophen (TYLENOL) 650 MG CR tablet Take 650 mg by mouth every 8 (eight) hours as needed for pain.   albuterol (PROVENTIL HFA;VENTOLIN HFA) 108 (90 Base) MCG/ACT inhaler Inhale 2 puffs into the lungs every 6 (six) hours as needed for wheezing or shortness of breath.   aspirin EC 81 MG tablet Take 1 tablet (81 mg total) by mouth daily.   Blood Glucose Monitoring Suppl (ACCU-CHEK AVIVA PLUS) w/Device KIT 1 each by Does not apply route 4 (four) times daily -  before meals and at bedtime.   carvedilol (COREG) 3.125 MG tablet Take 1 tablet (3.125 mg  total) by mouth 2 (two) times daily with a meal.   Continuous Blood Gluc Receiver (FREESTYLE LIBRE 2 READER) DEVI 1 Act by Does not apply route daily.   Continuous Blood Gluc Sensor (FREESTYLE LIBRE 2 SENSOR) MISC 1 Act by Does not apply route daily.   dapagliflozin propanediol (FARXIGA) 10 MG TABS tablet Take 1 tablet (10 mg total) by mouth daily before breakfast.   gabapentin (NEURONTIN) 300 MG capsule Take 1 capsule (300 mg total) by mouth 2 (two) times daily.   Glucagon (GVOKE HYPOPEN 2-PACK) 1 MG/0.2ML SOAJ Inject 1 Act into the skin daily as needed.   insulin lispro (HUMALOG KWIKPEN) 100 UNIT/ML KwikPen ADMINISTER UP TO 10 UNITS UNDER THE SKIN THREE TIMES DAILY AS DIRECTED PER SLIDING SCALE   Insulin Pen Needle 31G X 8 MM MISC 1 each by Does not apply route 4 (four) times daily.   LANTUS SOLOSTAR 100 UNIT/ML Solostar Pen ADMINISTER 55 UNITS UNDER THE SKIN DAILY   pantoprazole (PROTONIX) 40 MG tablet TAKE 1 TABLET BY MOUTH TWICE DAILY BEFORE A MEAL AND 30 MINUTES BEFORE BREAKFAST AND SUPPER   pramipexole (MIRAPEX) 0.125 MG tablet TAKE 1 TABLET(0.125 MG) BY MOUTH AT BEDTIME   rosuvastatin (CRESTOR) 40 MG tablet TAKE 1 TABLET BY MOUTH EVERY DAY   torsemide (DEMADEX) 20 MG tablet TAKE 1 TABLET(20  MG) BY MOUTH 3 TIMES A WEEK   warfarin (COUMADIN) 5 MG tablet TAKE 1 TO 1 AND 1/2 TABLET BY MOUTH DAILY AS DIRECTED BY COUMADIN CLINIC   No current facility-administered medications for this visit. (Other)   REVIEW OF SYSTEMS: ROS   Positive for: Eyes Negative for: Constitutional, Gastrointestinal, Neurological, Skin, Genitourinary, Musculoskeletal, HENT, Endocrine, Cardiovascular, Respiratory, Psychiatric, Allergic/Imm, Heme/Lymph Last edited by Jobe Marker, COT on 06/12/2021  7:53 AM.      ALLERGIES Allergies  Allergen Reactions   Morphine And Related Shortness Of Breath and Other (See Comments)    UNSPECIFIED REACTION "Pt said it was too much"    Latex Rash   PAST MEDICAL  HISTORY Past Medical History:  Diagnosis Date   AKI (acute kidney injury) ([place])    With STEMI in 9892   Chronic systolic CHF (congestive heart failure) ([place])    Diabetes mellitus without complication ([place])    Hx of adenomatous colonic polyps 04/07/2018   Hyperlipidemia    Hypertension    Paroxysmal atrial fibrillation ([name])    STEMI (ST elevation myocardial infarction) ([place]) 2017   Past Surgical History:  Procedure Laterality Date   ABDOMINAL AORTOGRAM W/LOWER EXTREMITY N/A 09/19/2016   Procedure: Abdominal Aortogram w/Lower Extremity;  Surgeon: Wellington Hampshire, MD;  Location: [facility];  Service: Cardiovascular;  Laterality: N/A;   AMPUTATION Right 03/23/2016   Procedure: 1st and 2nd Ray Amputation Right Foot;  Surgeon: Newt Minion, MD;  Location: [facility];  Service: Orthopedics;  Laterality: Right;   AMPUTATION Right 06/21/2016   Procedure: RIGHT TRANSMETATARSAL AMPUTATION;  Surgeon: Newt Minion, MD;  Location: [facility];  Service: Orthopedics;  Laterality: Right;   CARDIAC CATHETERIZATION N/A 03/13/2016   Procedure: Right/Left Heart Cath and Coronary Angiography;  Surgeon: Sherren Mocha, MD;  Location: [facility];  Service: Cardiovascular;  Laterality: N/A;   CARDIAC CATHETERIZATION N/A 03/13/2016   Procedure: IABP Insertion;  Surgeon: Sherren Mocha, MD;  Location: [facility];  Service: Cardiovascular;  Laterality: N/A;   CATARACT EXTRACTION, BILATERAL     CERVICAL FUSION  1982, 1992   has had 3 neck surgeries from breaking his neck   CORONARY ARTERY BYPASS GRAFT N/A 03/13/2016   Procedure: CORONARY ARTERY BYPASS GRAFTING (CABG) x 1 (SVG to OM) with EVH from [place];  Surgeon: Ivin Poot, MD;  Location: [facility];  Service: Open Heart Surgery;  Laterality: N/A;   LOWER EXTREMITY ANGIOGRAM  05/02/2016   Procedure: Lower Extremity Angiogram;  Surgeon: Wellington Hampshire, MD;  Location: [facility];  Service: Cardiovascular;;  Limited left  femoral runoff right femoral runoff   PERIPHERAL VASCULAR CATHETERIZATION N/A 05/02/2016   Procedure: Abdominal Aortogram;  Surgeon: Wellington Hampshire, MD;  Location: [facility];  Service: Cardiovascular;  Laterality: N/A;   PERIPHERAL VASCULAR CATHETERIZATION Right 05/02/2016   Procedure: Peripheral Vascular Balloon Angioplasty;  Surgeon: Wellington Hampshire, MD;  Location: [facility];  Service: Cardiovascular;  Laterality: Right;  SFA   TEE WITHOUT CARDIOVERSION N/A 03/13/2016   Procedure: TRANSESOPHAGEAL ECHOCARDIOGRAM (TEE);  Surgeon: Ivin Poot, MD;  Location: [facility];  Service: Open Heart Surgery;  Laterality: N/A;   VSD REPAIR N/A 03/13/2016   Procedure: VENTRICULAR SEPTAL DEFECT (VSD) REPAIR;  Surgeon: Ivin Poot, MD;  Location: [facility];  Service: Open Heart Surgery;  Laterality: N/A;   FAMILY HISTORY Family History  Problem Relation Age of Onset  Diabetes Maternal Grandmother    Diabetes Mother    Aneurysm Mother    Peripheral Artery Disease Mother    Coronary artery disease Mother    Peptic Ulcer Father    Retinoblastoma Daughter    Colon cancer Neg Hx    Rectal cancer Neg Hx    SOCIAL HISTORY Social History   Tobacco Use   Smoking status: Former    Packs/day: 0.50    Types: Cigarettes    Quit date: 11/20/2015    Years since quitting: 5.5   Smokeless tobacco: Never   Tobacco comments:    quit 2018  Vaping Use   Vaping Use: Never used  Substance Use Topics   Alcohol use: No   Drug use: No       OPHTHALMIC EXAM: Base Eye Exam     Visual Acuity (Snellen - Linear)       Right Left   Dist [place] 20/30 -2 20/30 -1   Dist ph  NI 20/30         Tonometry (Tonopen, 7:57 AM)       Right Left   Pressure 12 11         Pupils       Dark Light Shape React APD   Right 2 1 Round Minimal None   Left 2 1 Round Minimal None         Visual Fields (Counting fingers)       Left Right    Full Full         Extraocular Movement        Right Left    Full, Ortho Full, Ortho         Neuro/Psych     Oriented x3: Yes   Mood/Affect: Normal         Dilation     Both eyes: 1.0% Mydriacyl, 2.5% Phenylephrine @ 7:56 AM           Slit Lamp and Fundus Exam     Slit Lamp Exam       Right Left   Lids/Lashes Dermatochalasis - upper lid, mild MGD Dermatochalasis - upper lid, mild MGD   Conjunctiva/Sclera White and quiet White and quiet   Cornea trace PEE trace PEE   Anterior Chamber Deep and quiet Deep and quiet   Iris Round and dilated, No NVI Round and dilated, No NVI   Lens PC IOL in good position PC IOL in good position   Anterior Vitreous Vitreous syneresis, vitreous condensations Vitreous syneresis, Posterior vitreous detachment, vitreous condensations         Fundus Exam       Right Left   Disc Pink and Sharp mild Pallor, Sharp rim, mild tilt   C/D Ratio 0.3 0.3   Macula Flat, Blunted foveal reflex, scatted Microaneurysms/DBH, scatterd Cystic changes/edema greatest superior mac -- slightly increased Blunted foveal reflex, central IRF/edema, +MA/DBH -- persistent   Vessels mild attenuation, mild tortuousity, no NV attenuated, Tortuous, mild Copper wiring   Periphery Attached, +MA/DBH greatest posteriorly Attached, scattered MA/DBH greatest posteriorly              IMAGING AND PROCEDURES  Imaging and Procedures for 06/12/2021  OCT, Retina - OU - Both Eyes       Right Eye Quality was good. Central Foveal Thickness: 359. Progression has worsened. Findings include abnormal foveal contour, intraretinal fluid, no SRF, intraretinal hyper-reflective material (Mild interval increase in IRF/IRHM ).   Left Eye Quality was good. Central Foveal  Thickness: 375. Progression has been stable. Findings include abnormal foveal contour, intraretinal fluid, intraretinal hyper-reflective material, no SRF (Persistent IRF -- slightly increased nasal fovea).   Notes *Images captured and stored on drive  Diagnosis  / Impression:  +DME OU OD: Mild interval increase in IRF/IRHM  OS: Persistent IRF -- slightly increased nasal fovea  Clinical management:  See below  Abbreviations: NFP - Normal foveal profile. CME - cystoid macular edema. PED - pigment epithelial detachment. IRF - intraretinal fluid. SRF - subretinal fluid. EZ - ellipsoid zone. ERM - epiretinal membrane. ORA - outer retinal atrophy. ORT - outer retinal tubulation. SRHM - subretinal hyper-reflective material. IRHM - intraretinal hyper-reflective material      Intravitreal Injection, Pharmacologic Agent - OD - Right Eye       Time Out 06/12/2021. 8:33 AM. Confirmed correct patient, procedure, site, and patient consented.   Anesthesia Topical anesthesia was used. Anesthetic medications included Lidocaine 2%, Proparacaine 0.5%.   Procedure Preparation included 5% betadine to ocular surface, eyelid speculum. A supplied needle was used.   Injection: 1.25 mg Bevacizumab 1.69m/0.05ml   Route: Intravitreal, Site: Right Eye   NDC:: 22979-892-11 Lot: [id number] , Expiration date: 08/08/2021, Waste: 0 mL   Post-op Post injection exam found visual acuity of at least counting fingers. The patient tolerated the procedure well. There were no complications. The patient received written and verbal post procedure care education. Post injection medications were not given.      Intravitreal Injection, Pharmacologic Agent - OS - Left Eye       Time Out 06/12/2021. 8:33 AM. Confirmed correct patient, procedure, site, and patient consented.   Anesthesia Topical anesthesia was used. Anesthetic medications included Lidocaine 2%, Proparacaine 0.5%.   Procedure Preparation included 5% betadine to ocular surface, eyelid speculum. A (32g) needle was used.   Injection: 1.25 mg Bevacizumab 1.225m0.05ml   Route: Intravitreal, Site: Left Eye   NDC: : 94174-081-44Lot: [id number], Expiration date: 07/05/2021, Waste: 0.05 mL   Post-op Post injection exam  found visual acuity of at least counting fingers. The patient tolerated the procedure well. There were no complications. The patient received written and verbal post procedure care education. Post injection medications were not given.            ASSESSMENT/PLAN:    ICD-10-CM   1. Moderate nonproliferative diabetic retinopathy of both eyes with macular edema associated with type 2 diabetes mellitus ([name])  E1Y18.5631ntravitreal Injection, Pharmacologic Agent - OD - Right Eye    Intravitreal Injection, Pharmacologic Agent - OS - Left Eye    Bevacizumab (AVASTIN) SOLN 1.25 mg    Bevacizumab (AVASTIN) SOLN 1.25 mg    2. Retinal edema  H35.81 OCT, Retina - OU - Both Eyes    3. Essential hypertension  I10     4. Hypertensive retinopathy of both eyes  H35.033     5. Pseudophakia, both eyes  Z96.1       1,2. Moderate Non-proliferative diabetic retinopathy, both eyes  - s/p IVA OD #1 (11.14.22) - s/p IVA OS #1 (10.17.22), #2 (11.14.22) - exam shows scattered MA, DBH OU; - FA (10.17.22) shows late leaking MA OU, no NV OU - OCT shows OD: Mild interval increase in IRF/IRHM ; OS: Persistent IRF -- slightly increased nasal fovea - BCVA 20/30 OD, 20/30 OS (dec OU) - recommend IVA OU (OD #2 and OS #3) today, 12.12.22 for DME  - pt wishes to proceed - RBA of procedure discussed, questions answered - informed consent obtained and  signed - see procedure note - f/u in 4 wks -- DFE/OCT, possible injection(s)  3,4. Hypertensive retinopathy OU - discussed importance of tight BP control - monitor  5. Pseudophakia OU  - s/p CE/IOL  - IOL in good position, doing well  - monitor  Ophthalmic Meds Ordered this visit:  Meds ordered this encounter  Medications   Bevacizumab (AVASTIN) SOLN 1.25 mg   Bevacizumab (AVASTIN) SOLN 1.25 mg     Return in about 4 weeks (around 07/10/2021) for DME OU - Dilated Exam, OCT, Possible Injxn.  There are no Patient Instructions on file for this  visit.  Explained the diagnoses, plan, and follow up with the patient and they expressed understanding.  Patient expressed understanding of the importance of proper follow up care.   This document serves as a record of services personally performed by Gardiner Sleeper, MD, PhD. It was created on their behalf by Roselee Nova, COMT. The creation of this record is the provider's dictation and/or activities during the visit.  Electronically signed by: Roselee Nova, COMT 06/12/21 9:08 AM  This document serves as a record of services personally performed by Gardiner Sleeper, MD, PhD. It was created on their behalf by Leonie Douglas, an ophthalmic technician. The creation of this record is the provider's dictation and/or activities during the visit.    Electronically signed by: Leonie Douglas COA, 06/12/21  9:08 AM  Gardiner Sleeper, M.D., Ph.D. Diseases & Surgery of the Retina and Vitreous [facility]  I have reviewed the above documentation for accuracy and completeness, and I agree with the above. Gardiner Sleeper, M.D., Ph.D. 06/12/21 9:08 AM   Abbreviations: M myopia (nearsighted); A astigmatism; H hyperopia (farsighted); P presbyopia; Mrx spectacle prescription;  CTL contact lenses; OD right eye; OS left eye; OU both eyes  XT exotropia; ET esotropia; PEK punctate epithelial keratitis; PEE punctate epithelial erosions; DES dry eye syndrome; MGD meibomian gland dysfunction; ATs artificial tears; PFAT's preservative free artificial tears; [place] nuclear sclerotic cataract; PSC posterior subcapsular cataract; ERM epi-retinal membrane; PVD posterior vitreous detachment; RD retinal detachment; DM diabetes mellitus; DR diabetic retinopathy; NPDR non-proliferative diabetic retinopathy; PDR proliferative diabetic retinopathy; CSME clinically significant macular edema; DME diabetic macular edema; dbh dot blot hemorrhages; CWS cotton wool spot; POAG primary open angle glaucoma; C/D cup-to-disc  ratio; HVF humphrey visual field; GVF goldmann visual field; OCT optical coherence tomography; IOP intraocular pressure; BRVO Branch retinal vein occlusion; CRVO central retinal vein occlusion; CRAO central retinal artery occlusion; BRAO branch retinal artery occlusion; RT retinal tear; SB scleral buckle; PPV pars plana vitrectomy; VH Vitreous hemorrhage; PRP panretinal laser photocoagulation; IVK intravitreal kenalog; VMT vitreomacular traction; MH Macular hole;  NVD neovascularization of the disc; NVE neovascularization elsewhere; AREDS age related eye disease study; ARMD age related macular degeneration; POAG primary open angle glaucoma; EBMD epithelial/anterior basement membrane dystrophy; ACIOL anterior chamber intraocular lens; IOL intraocular lens; PCIOL posterior chamber intraocular lens; Phaco/IOL phacoemulsification with intraocular lens placement; [place] photorefractive keratectomy; LASIK laser assisted in situ keratomileusis; HTN hypertension; DM diabetes mellitus; COPD chronic obstructive pulmonary disease

## 2021-06-12 ENCOUNTER — Ambulatory Visit (INDEPENDENT_AMBULATORY_CARE_PROVIDER_SITE_OTHER): Payer: Medicare Other | Admitting: Ophthalmology

## 2021-06-12 ENCOUNTER — Encounter (INDEPENDENT_AMBULATORY_CARE_PROVIDER_SITE_OTHER): Payer: Self-pay | Admitting: Ophthalmology

## 2021-06-12 ENCOUNTER — Telehealth: Payer: Self-pay | Admitting: Internal Medicine

## 2021-06-12 ENCOUNTER — Other Ambulatory Visit: Payer: Self-pay

## 2021-06-12 ENCOUNTER — Other Ambulatory Visit: Payer: Self-pay | Admitting: Internal Medicine

## 2021-06-12 DIAGNOSIS — Z961 Presence of intraocular lens: Secondary | ICD-10-CM

## 2021-06-12 DIAGNOSIS — E113313 Type 2 diabetes mellitus with moderate nonproliferative diabetic retinopathy with macular edema, bilateral: Secondary | ICD-10-CM | POA: Diagnosis not present

## 2021-06-12 DIAGNOSIS — I10 Essential (primary) hypertension: Secondary | ICD-10-CM | POA: Diagnosis not present

## 2021-06-12 DIAGNOSIS — H3581 Retinal edema: Secondary | ICD-10-CM

## 2021-06-12 DIAGNOSIS — K219 Gastro-esophageal reflux disease without esophagitis: Secondary | ICD-10-CM

## 2021-06-12 DIAGNOSIS — H35033 Hypertensive retinopathy, bilateral: Secondary | ICD-10-CM

## 2021-06-12 MED ORDER — BEVACIZUMAB CHEMO INJECTION 1.25MG/0.05ML SYRINGE FOR KALEIDOSCOPE
1.2500 mg | INTRAVITREAL | Status: AC | PRN
Start: 2021-06-12 — End: 2021-06-12
  Administered 2021-06-12: 1.25 mg via INTRAVITREAL

## 2021-06-12 MED ORDER — PANTOPRAZOLE SODIUM 40 MG PO TBEC: 40.0000 mg | DELAYED_RELEASE_TABLET | Freq: Two times a day (BID) | ORAL | 0 refills | Status: DC

## 2021-06-12 MED ORDER — BEVACIZUMAB CHEMO INJECTION 1.25MG/0.05ML SYRINGE FOR KALEIDOSCOPE
1.2500 mg | INTRAVITREAL | Status: AC | PRN
  Administered 2021-06-12: 1.25 mg via INTRAVITREAL

## 2021-06-12 NOTE — Telephone Encounter (Signed)
1.Medication Requested: pantoprazole (PROTONIX) 40 MG tablet   2. Pharmacy (Name, [place]): [id number] - [address], [place] AT [facility] [address]  Phone:  [phone] Fax:  [phone]   3. On Med List: yes  4. Last Visit with PCP: 05.24.22  5. Next visit date with PCP: 01.09.23   Agent: Please be advised that RX refills may take up to 3 business days. We ask that you follow-up with your pharmacy.

## 2021-06-19 ENCOUNTER — Encounter: Payer: Self-pay | Admitting: Orthopedic Surgery

## 2021-06-19 ENCOUNTER — Other Ambulatory Visit: Payer: Self-pay

## 2021-06-19 ENCOUNTER — Ambulatory Visit (INDEPENDENT_AMBULATORY_CARE_PROVIDER_SITE_OTHER): Payer: Medicare Other | Admitting: Orthopedic Surgery

## 2021-06-19 DIAGNOSIS — M795 Residual foreign body in soft tissue: Secondary | ICD-10-CM | POA: Diagnosis not present

## 2021-06-19 DIAGNOSIS — L97521 Non-pressure chronic ulcer of other part of left foot limited to breakdown of skin: Secondary | ICD-10-CM

## 2021-06-19 NOTE — Progress Notes (Signed)
Office Visit Note   Patient: William [name]           Date of Birth: 1950/09/06           MRN: [id number] Visit Date: 06/19/2021              Requested by: Binnie Rail, MD [address] [place],  [place] [address] PCP: [name], MD  Chief Complaint  Patient presents with   Left Foot - Wound Check    Diabetic ulcer plantar aspect      HPI: Patient is a 70 year old gentleman who was seen for a painful ulcer on the plantar aspect the left foot.  Patient states it started as a callus about a month ago.  Assessment & Plan: Visit Diagnoses:  1. Non-pressure chronic ulcer of other part of left foot limited to breakdown of skin ([place])   2. Foreign body (FB) in soft tissue     Plan: Recommended shoe wear for his activities of daily living.  Follow-Up Instructions: Return if symptoms worsen or fail to improve.   Ortho Exam  Patient is alert, oriented, no adenopathy, well-dressed, normal affect, normal respiratory effort. Examination patient has a good pulse he has a callused ulcer on the plantar aspect the left foot.  After informed consent a 10 blade knife was used to debride the skin and soft tissue back to healthy viable tissue.  There was a deep metallic wire that was removed from the wound.  After debridement the wound was 10 cm in diameter 3 mm deep no purulent drainage no signs of any infection.  Imaging: No results found. No images are attached to the encounter.  Labs: Lab Results  Component Value Date   HGBA1C 8.2 (H) 11/22/2020   HGBA1C 7.3 (A) 09/19/2020   HGBA1C 7.4 (A) 10/29/2019   HGBA1C 7.4 10/29/2019   HGBA1C 7.4 (A) 10/29/2019   HGBA1C 7.4 (A) 10/29/2019   REPTSTATUS 06/26/2016 FINAL 06/21/2016   GRAMSTAIN  03/14/2016    RARE WBC PRESENT, PREDOMINANTLY PMN NO ORGANISMS SEEN    CULT NO GROWTH 5 DAYS 06/21/2016   LABORGA PSEUDOMONAS AERUGINOSA 03/14/2016     Lab Results  Component Value Date   ALBUMIN 4.2 11/22/2020   ALBUMIN 4.3  01/05/2019   ALBUMIN 4.6 03/10/2018    Lab Results  Component Value Date   MG 2.3 06/22/2016   MG 2.2 03/30/2016   MG 1.7 03/23/2016   No results found for: VD25OH  No results found for: PREALBUMIN CBC EXTENDED Latest Ref Rng & Units 11/22/2020 09/01/2019 03/10/2018  WBC 4.0 - 10.5 K/uL 6.7 6.4 6.3  RBC 4.22 - 5.81 Mil/uL 4.24 4.19 4.19  HGB 13.0 - 17.0 g/dL 12.6(L) 12.6(L) 12.3(L)  HCT 39.0 - 52.0 % 36.5(L) 36.1(L) 37.6  PLT 150.0 - 400.0 K/uL 170.0 147(L) 153  NEUTROABS 1.4 - 7.7 K/uL 4.2 - 4.0  LYMPHSABS 0.7 - 4.0 K/uL 1.8 - 1.5     There is no height or weight on file to calculate BMI.  Orders:  No orders of the defined types were placed in this encounter.  No orders of the defined types were placed in this encounter.    Procedures: No procedures performed  Clinical Data: No additional findings.  ROS:  All other systems negative, except as noted in the HPI. Review of Systems  Objective: Vital Signs: There were no vitals taken for this visit.  Specialty Comments:  No specialty comments available.  PMFS History: Patient Active Problem  List   Diagnosis Date Noted   Encounter for general adult medical examination with abnormal findings 11/28/2020   Thrombocytopenia ([place]) 11/22/2020   Deficiency anemia 11/22/2020   Insulin-requiring or dependent type II diabetes mellitus ([place]) 11/22/2020   Hyperlipidemia LDL goal <70 11/22/2020   Tobacco dependence 03/24/2017   Type 2 diabetes mellitus with stage 3b chronic kidney disease, without long-term current use of insulin ([place]) 09/17/2016   Peripheral vascular disorder ([place]) 09/17/2016   CKD (chronic kidney disease) stage 3, GFR 30-59 ml/min ([name])    Chronic systolic HF (heart failure) ([place]) 05/28/2016   Coronary artery disease due to lipid rich plaque 03/16/2016   VSD (ventricular septal defect) 03/16/2016   Paroxysmal atrial fibrillation ([place]) 03/15/2016   Erectile dysfunction associated with type 2 diabetes  mellitus ([place]) 04/07/2012   Mood disorder ([place]) 03/12/2011   Essential hypertension 03/17/2010   GERD 11/01/2008   Type 2 diabetes mellitus with diabetic peripheral angiopathy and gangrene, with long-term current use of insulin ([place]) 09/23/2008   Past Medical History:  Diagnosis Date   AKI (acute kidney injury) ([place])    With STEMI in 5053   Chronic systolic CHF (congestive heart failure) ([place])    Diabetes mellitus without complication ([place])    Hx of adenomatous colonic polyps 04/07/2018   Hyperlipidemia    Hypertension    Paroxysmal atrial fibrillation ([name])    STEMI (ST elevation myocardial infarction) ([place]) 2017    Family History  Problem Relation Age of Onset   Diabetes Maternal Grandmother    Diabetes Mother    Aneurysm Mother    Peripheral Artery Disease Mother    Coronary artery disease Mother    Peptic Ulcer Father    Retinoblastoma Daughter    Colon cancer Neg Hx    Rectal cancer Neg Hx     Past Surgical History:  Procedure Laterality Date   ABDOMINAL AORTOGRAM W/LOWER EXTREMITY N/A 09/19/2016   Procedure: Abdominal Aortogram w/Lower Extremity;  Surgeon: Wellington Hampshire, MD;  Location: [facility];  Service: Cardiovascular;  Laterality: N/A;   AMPUTATION Right 03/23/2016   Procedure: 1st and 2nd Ray Amputation Right Foot;  Surgeon: Newt Minion, MD;  Location: [facility];  Service: Orthopedics;  Laterality: Right;   AMPUTATION Right 06/21/2016   Procedure: RIGHT TRANSMETATARSAL AMPUTATION;  Surgeon: Newt Minion, MD;  Location: [facility];  Service: Orthopedics;  Laterality: Right;   CARDIAC CATHETERIZATION N/A 03/13/2016   Procedure: Right/Left Heart Cath and Coronary Angiography;  Surgeon: Sherren Mocha, MD;  Location: [facility];  Service: Cardiovascular;  Laterality: N/A;   CARDIAC CATHETERIZATION N/A 03/13/2016   Procedure: IABP Insertion;  Surgeon: Sherren Mocha, MD;  Location: [facility];  Service: Cardiovascular;  Laterality: N/A;   CATARACT  EXTRACTION, BILATERAL     CERVICAL FUSION  1982, 1992   has had 3 neck surgeries from breaking his neck   CORONARY ARTERY BYPASS GRAFT N/A 03/13/2016   Procedure: CORONARY ARTERY BYPASS GRAFTING (CABG) x 1 (SVG to OM) with EVH from [place];  Surgeon: Ivin Poot, MD;  Location: [facility];  Service: Open Heart Surgery;  Laterality: N/A;   LOWER EXTREMITY ANGIOGRAM  05/02/2016   Procedure: Lower Extremity Angiogram;  Surgeon: Wellington Hampshire, MD;  Location: [facility];  Service: Cardiovascular;;  Limited left femoral runoff right femoral runoff   PERIPHERAL VASCULAR CATHETERIZATION N/A 05/02/2016   Procedure: Abdominal Aortogram;  Surgeon: Wellington Hampshire, MD;  Location:  [facility];  Service: Cardiovascular;  Laterality: N/A;   PERIPHERAL VASCULAR CATHETERIZATION Right 05/02/2016   Procedure: Peripheral Vascular Balloon Angioplasty;  Surgeon: Wellington Hampshire, MD;  Location: [facility];  Service: Cardiovascular;  Laterality: Right;  SFA   TEE WITHOUT CARDIOVERSION N/A 03/13/2016   Procedure: TRANSESOPHAGEAL ECHOCARDIOGRAM (TEE);  Surgeon: Ivin Poot, MD;  Location: [facility];  Service: Open Heart Surgery;  Laterality: N/A;   VSD REPAIR N/A 03/13/2016   Procedure: VENTRICULAR SEPTAL DEFECT (VSD) REPAIR;  Surgeon: Ivin Poot, MD;  Location: [facility];  Service: Open Heart Surgery;  Laterality: N/A;   Social History   Occupational History   Not on file  Tobacco Use   Smoking status: Former    Packs/day: 0.50    Types: Cigarettes    Quit date: 11/20/2015    Years since quitting: 5.5   Smokeless tobacco: Never   Tobacco comments:    quit 2018  Vaping Use   Vaping Use: Never used  Substance and Sexual Activity   Alcohol use: No   Drug use: No   Sexual activity: Not on file

## 2021-06-20 ENCOUNTER — Ambulatory Visit (INDEPENDENT_AMBULATORY_CARE_PROVIDER_SITE_OTHER): Payer: Medicare Other | Admitting: *Deleted

## 2021-06-20 DIAGNOSIS — I480 Paroxysmal atrial fibrillation: Secondary | ICD-10-CM | POA: Diagnosis not present

## 2021-06-20 DIAGNOSIS — Z5181 Encounter for therapeutic drug level monitoring: Secondary | ICD-10-CM

## 2021-06-20 LAB — POCT INR: INR: 1.5 — AB (ref 2.0–3.0)

## 2021-06-20 NOTE — Patient Instructions (Signed)
Description    -Take 2 tablets of warfarin today -Tomorrow take 1.5 tablets of warfarin -Then Continue taking 1.5 tablets daily except for 1 tablet every Monday, Wednesday and Friday.  Recheck INR in 2 weeks.

## 2021-07-04 ENCOUNTER — Other Ambulatory Visit: Payer: Self-pay | Admitting: Internal Medicine

## 2021-07-04 DIAGNOSIS — E1152 Type 2 diabetes mellitus with diabetic peripheral angiopathy with gangrene: Secondary | ICD-10-CM

## 2021-07-05 NOTE — Progress Notes (Signed)
[facility] Note  07/10/2021     CHIEF COMPLAINT Patient presents for Retina Follow Up    HISTORY OF PRESENT ILLNESS: William [name] is a 71 y.o. male who presents to the clinic today for:   HPI     Retina Follow Up   Patient presents with  Diabetic Retinopathy.  In both eyes.  This started months ago.  Severity is moderate.  Duration of 4 weeks.  Since onset it is stable.  I, the attending physician,  performed the HPI with the patient and updated documentation appropriately.        Comments   71 y/o male pt here for 4 wk f/u for mod NPDR OU.  No change in [place] OU noticed.  Denies pain, FOL, floaters.  OD occasionally feels like "it has something in it."  No gtts.  BS 140 this a.m.  A1C unknown.      Last edited by [name], MD on 07/10/2021  9:52 AM.     Pt states vision is okay  Referring physician: [name], MD [place],  [place] [address]  HISTORICAL INFORMATION:  Selected notes from the MEDICAL RECORD NUMBER Referred by Dr. [name] for eval of DME OU LEE:  Ocular Hx- PMH-    CURRENT MEDICATIONS: No current outpatient medications on file. (Ophthalmic Drugs)   No current facility-administered medications for this visit. (Ophthalmic Drugs)   Current Outpatient Medications (Other)  Medication Sig   ACCU-CHEK AVIVA PLUS test strip TEST 3 TIMES DAILY PRIOR TO MEALS AND AT BEDTIME   Accu-Chek Softclix Lancets lancets 1 each by Other route 4 (four) times daily as needed for other. Use as instructed   acetaminophen (TYLENOL) 650 MG CR tablet Take 650 mg by mouth every 8 (eight) hours as needed for pain.   albuterol (PROVENTIL HFA;VENTOLIN HFA) 108 (90 Base) MCG/ACT inhaler Inhale 2 puffs into the lungs every 6 (six) hours as needed for wheezing or shortness of breath.   aspirin EC 81 MG tablet Take 1 tablet (81 mg total) by mouth daily.   Blood Glucose Monitoring Suppl (ACCU-CHEK AVIVA PLUS) w/Device KIT 1 each by Does  not apply route 4 (four) times daily -  before meals and at bedtime.   carvedilol (COREG) 3.125 MG tablet Take 1 tablet (3.125 mg total) by mouth 2 (two) times daily with a meal.   Continuous Blood Gluc Receiver (FREESTYLE LIBRE 2 READER) DEVI 1 Act by Does not apply route daily.   Continuous Blood Gluc Sensor (FREESTYLE LIBRE 2 SENSOR) MISC 1 Act by Does not apply route daily.   dapagliflozin propanediol (FARXIGA) 10 MG TABS tablet Take 1 tablet (10 mg total) by mouth daily before breakfast.   gabapentin (NEURONTIN) 300 MG capsule Take 1 capsule (300 mg total) by mouth 2 (two) times daily.   Glucagon (GVOKE HYPOPEN 2-PACK) 1 MG/0.2ML SOAJ Inject 1 Act into the skin daily as needed.   insulin lispro (HUMALOG KWIKPEN) 100 UNIT/ML KwikPen ADMINISTER UP TO 10 UNITS UNDER THE SKIN THREE TIMES DAILY AS DIRECTED PER SLIDING SCALE   Insulin Pen Needle 31G X 8 MM MISC 1 each by Does not apply route 4 (four) times daily.   LANTUS SOLOSTAR 100 UNIT/ML Solostar Pen ADMINISTER 55 UNITS UNDER THE SKIN DAILY   pantoprazole (PROTONIX) 40 MG tablet Take 1 tablet (40 mg total) by mouth 2 (two) times daily before a meal.   pramipexole (MIRAPEX) 0.125 MG tablet TAKE 1  TABLET(0.125 MG) BY MOUTH AT BEDTIME   rosuvastatin (CRESTOR) 40 MG tablet TAKE 1 TABLET BY MOUTH EVERY DAY   torsemide (DEMADEX) 20 MG tablet TAKE 1 TABLET(20 MG) BY MOUTH 3 TIMES A WEEK   warfarin (COUMADIN) 5 MG tablet TAKE 1 TO 1 AND 1/2 TABLET BY MOUTH DAILY AS DIRECTED BY COUMADIN CLINIC   No current facility-administered medications for this visit. (Other)   REVIEW OF SYSTEMS: ROS   Positive for: Gastrointestinal, Genitourinary, Endocrine, Eyes Negative for: Constitutional, Neurological, Skin, Musculoskeletal, HENT, Cardiovascular, Respiratory, Psychiatric, Allergic/Imm, Heme/Lymph Last edited by Matthew Folks, COA on 07/10/2021  8:15 AM.     ALLERGIES Allergies  Allergen Reactions   Morphine And Related Shortness Of Breath and Other  (See Comments)    UNSPECIFIED REACTION "Pt said it was too much"    Latex Rash   PAST MEDICAL HISTORY Past Medical History:  Diagnosis Date   AKI (acute kidney injury) ([place])    With STEMI in 2542   Chronic systolic CHF (congestive heart failure) ([place])    Diabetes mellitus without complication ([place])    Diabetic retinopathy ([place])    Hx of adenomatous colonic polyps 04/07/2018   Hyperlipidemia    Hypertension    Hypertensive retinopathy    Paroxysmal atrial fibrillation ([name])    STEMI (ST elevation myocardial infarction) ([place]) 2017   Past Surgical History:  Procedure Laterality Date   ABDOMINAL AORTOGRAM W/LOWER EXTREMITY N/A 09/19/2016   Procedure: Abdominal Aortogram w/Lower Extremity;  Surgeon: Wellington Hampshire, MD;  Location: [facility];  Service: Cardiovascular;  Laterality: N/A;   AMPUTATION Right 03/23/2016   Procedure: 1st and 2nd Ray Amputation Right Foot;  Surgeon: Newt Minion, MD;  Location: [facility];  Service: Orthopedics;  Laterality: Right;   AMPUTATION Right 06/21/2016   Procedure: RIGHT TRANSMETATARSAL AMPUTATION;  Surgeon: Newt Minion, MD;  Location: [facility];  Service: Orthopedics;  Laterality: Right;   CARDIAC CATHETERIZATION N/A 03/13/2016   Procedure: Right/Left Heart Cath and Coronary Angiography;  Surgeon: Sherren Mocha, MD;  Location: [facility];  Service: Cardiovascular;  Laterality: N/A;   CARDIAC CATHETERIZATION N/A 03/13/2016   Procedure: IABP Insertion;  Surgeon: Sherren Mocha, MD;  Location: [facility];  Service: Cardiovascular;  Laterality: N/A;   CATARACT EXTRACTION     CATARACT EXTRACTION, BILATERAL     CERVICAL FUSION  1982, 1992   has had 3 neck surgeries from breaking his neck   CORONARY ARTERY BYPASS GRAFT N/A 03/13/2016   Procedure: CORONARY ARTERY BYPASS GRAFTING (CABG) x 1 (SVG to OM) with EVH from [place];  Surgeon: Ivin Poot, MD;  Location: [facility];  Service: Open Heart Surgery;  Laterality:  N/A;   EYE SURGERY     LOWER EXTREMITY ANGIOGRAM  05/02/2016   Procedure: Lower Extremity Angiogram;  Surgeon: Wellington Hampshire, MD;  Location: [facility];  Service: Cardiovascular;;  Limited left femoral runoff right femoral runoff   PERIPHERAL VASCULAR CATHETERIZATION N/A 05/02/2016   Procedure: Abdominal Aortogram;  Surgeon: Wellington Hampshire, MD;  Location: [facility];  Service: Cardiovascular;  Laterality: N/A;   PERIPHERAL VASCULAR CATHETERIZATION Right 05/02/2016   Procedure: Peripheral Vascular Balloon Angioplasty;  Surgeon: Wellington Hampshire, MD;  Location: [facility];  Service: Cardiovascular;  Laterality: Right;  SFA   TEE WITHOUT CARDIOVERSION N/A 03/13/2016   Procedure: TRANSESOPHAGEAL ECHOCARDIOGRAM (TEE);  Surgeon: Ivin Poot, MD;  Location: [facility];  Service:  Open Heart Surgery;  Laterality: N/A;   VSD REPAIR N/A 03/13/2016   Procedure: VENTRICULAR SEPTAL DEFECT (VSD) REPAIR;  Surgeon: Ivin Poot, MD;  Location: [facility];  Service: Open Heart Surgery;  Laterality: N/A;   FAMILY HISTORY Family History  Problem Relation Age of Onset   Diabetes Maternal Grandmother    Diabetes Mother    Aneurysm Mother    Peripheral Artery Disease Mother    Coronary artery disease Mother    Peptic Ulcer Father    Retinoblastoma Daughter    Colon cancer Neg Hx    Rectal cancer Neg Hx    SOCIAL HISTORY Social History   Tobacco Use   Smoking status: Former    Packs/day: 0.50    Types: Cigarettes    Quit date: 11/20/2015    Years since quitting: 5.6   Smokeless tobacco: Never   Tobacco comments:    quit 2018  Vaping Use   Vaping Use: Never used  Substance Use Topics   Alcohol use: No   Drug use: No       OPHTHALMIC EXAM: Base Eye Exam     Visual Acuity (Snellen - Linear)       Right Left   Dist [place] 20/20 -2 20/30 -2   Dist ph [place]  20/25 -2         Tonometry (Tonopen, 8:17 AM)       Right Left   Pressure 11 10         Pupils        Dark Light Shape React APD   Right 2 1 Round Minimal None   Left 2 1 Round Minimal None         Visual Fields (Counting fingers)       Left Right    Full Full         Extraocular Movement       Right Left    Full, Ortho Full, Ortho         Neuro/Psych     Oriented x3: Yes   Mood/Affect: Normal         Dilation     Both eyes: 1.0% Mydriacyl, 2.5% Phenylephrine @ 8:17 AM           Slit Lamp and Fundus Exam     Slit Lamp Exam       Right Left   Lids/Lashes Dermatochalasis - upper lid, mild MGD Dermatochalasis - upper lid, mild MGD   Conjunctiva/Sclera White and quiet White and quiet   Cornea trace PEE trace PEE   Anterior Chamber Deep and quiet Deep and quiet   Iris Round and dilated, No NVI Round and dilated, No NVI   Lens PC IOL in good position PC IOL in good position   Anterior Vitreous Vitreous syneresis, vitreous condensations Vitreous syneresis, Posterior vitreous detachment, vitreous condensations         Fundus Exam       Right Left   Disc Pink and Sharp mild Pallor, Sharp rim, mild tilt   C/D Ratio 0.3 0.3   Macula Flat, Blunted foveal reflex, scatted Microaneurysms/DBH - improved, scatterd Cystic changes/edema greatest superior mac -- slightly increased Blunted foveal reflex, central IRF/edema - slightly improved, +MA/DBH -- improved   Vessels mild attenuation, mild tortuousity, no NV attenuated, Tortuous, mild Copper wiring   Periphery Attached, +MA/DBH greatest posteriorly Attached, scattered MA/DBH greatest posteriorly           IMAGING AND PROCEDURES  Imaging and Procedures  for 07/10/2021  OCT, Retina - OU - Both Eyes       Right Eye Quality was good. Central Foveal Thickness: 386. Progression has worsened. Findings include abnormal foveal contour, intraretinal fluid, no SRF, intraretinal hyper-reflective material (Mild interval increase in IRF).   Left Eye Quality was good. Central Foveal Thickness: 319. Progression has been  stable. Findings include abnormal foveal contour, intraretinal fluid, intraretinal hyper-reflective material, no SRF (Mild interval improvement in IRF/IRHM).   Notes *Images captured and stored on drive  Diagnosis / Impression:  +DME OU OD: Mild interval increase in IRF OS: Mild interval improvement in IRF/IRHM  Clinical management:  See below  Abbreviations: NFP - Normal foveal profile. CME - cystoid macular edema. PED - pigment epithelial detachment. IRF - intraretinal fluid. SRF - subretinal fluid. EZ - ellipsoid zone. ERM - epiretinal membrane. ORA - outer retinal atrophy. ORT - outer retinal tubulation. SRHM - subretinal hyper-reflective material. IRHM - intraretinal hyper-reflective material      Intravitreal Injection, Pharmacologic Agent - OD - Right Eye       Time Out 07/10/2021. 8:43 AM. Confirmed correct patient, procedure, site, and patient consented.   Anesthesia Topical anesthesia was used. Anesthetic medications included Lidocaine 2%, Proparacaine 0.5%.   Procedure Preparation included 5% betadine to ocular surface, eyelid speculum. A (32g) needle was used.   Injection: 1.25 mg Bevacizumab 1.22m/0.05ml   Route: Intravitreal, Site: Right Eye   NDC: 50242-060-01, Lot:: [id number] Expiration date: 08/10/2021, Waste: 0.05 mL   Post-op Post injection exam found visual acuity of at least counting fingers. The patient tolerated the procedure well. There were no complications. The patient received written and verbal post procedure care education. Post injection medications were not given.      Intravitreal Injection, Pharmacologic Agent - OS - Left Eye       Time Out 07/10/2021. 8:43 AM. Confirmed correct patient, procedure, site, and patient consented.   Anesthesia Topical anesthesia was used. Anesthetic medications included Lidocaine 2%, Proparacaine 0.5%.   Procedure Preparation included 5% betadine to ocular surface, eyelid speculum. A (32g) needle was used.    Injection: 1.25 mg Bevacizumab 1.220m0.05ml   Route: Intravitreal, Site: Left Eye   NDC: [id number]: : [id number] date: 07/29/2021, Waste: 0 mL   Post-op Post injection exam found visual acuity of at least counting fingers. The patient tolerated the procedure well. There were no complications. The patient received written and verbal post procedure care education. Post injection medications were not given.            ASSESSMENT/PLAN:    ICD-10-CM   1. Moderate nonproliferative diabetic retinopathy of both eyes with macular edema associated with type 2 diabetes mellitus ([name])  E11.3313 OCT, Retina - OU - Both Eyes    Intravitreal Injection, Pharmacologic Agent - OD - Right Eye    Intravitreal Injection, Pharmacologic Agent - OS - Left Eye    Bevacizumab (AVASTIN) SOLN 1.25 mg    Bevacizumab (AVASTIN) SOLN 1.25 mg    2. Essential hypertension  I10     3. Hypertensive retinopathy of both eyes  H35.033     4. Pseudophakia, both eyes  Z96.1       1. Moderate Non-proliferative diabetic retinopathy, both eyes  - s/p IVA OD #1 (11.14.22), #2 (12.12.22) - s/p IVA OS #1 (10.17.22), #2 (11.14.22), #3(12.12.22) - exam shows scattered MA, DBH OU; - FA (10.17.22) shows late leaking MA OU, no NV OU - OCT shows OD: Mild interval increase in IRF; OS:  Mild interval improvement in IRF/IRHM - BCVA 20/20 OD, 20/25 OS (improved OU) - recommend IVA OU (OD #3 and OS #4) today, 01.09.23 for DME  - pt wishes to proceed - RBA of procedure discussed, questions answered - informed consent obtained and signed - see procedure note - f/u in 4 wks -- DFE/OCT, possible injection(s)  2,3. Hypertensive retinopathy OU - discussed importance of tight BP control - monitor  4. Pseudophakia OU  - s/p CE/IOL  - IOL in good position, doing well  - monitor  Ophthalmic Meds Ordered this visit:  Meds ordered this encounter  Medications   Bevacizumab (AVASTIN) SOLN 1.25 mg   Bevacizumab  (AVASTIN) SOLN 1.25 mg     Return in about 4 weeks (around 08/07/2021) for f/u NPDR OU, DFE, OCT.  There are no Patient Instructions on file for this visit.  Explained the diagnoses, plan, and follow up with the patient and they expressed understanding.  Patient expressed understanding of the importance of proper follow up care.   This document serves as a record of services personally performed by Gardiner Sleeper, MD, PhD. It was created on their behalf by Roselee Nova, COMT. The creation of this record is the provider's dictation and/or activities during the visit.  Electronically signed by: Roselee Nova, COMT 07/10/21 9:59 AM  This document serves as a record of services personally performed by Gardiner Sleeper, MD, PhD. It was created on their behalf by San Jetty. Owens Shark, OA an ophthalmic technician. The creation of this record is the provider's dictation and/or activities during the visit.    Electronically signed by: San Jetty. Owens Shark, [place] 01.09.2023 9:59 AM  Gardiner Sleeper, M.D., Ph.D. Diseases & Surgery of the Retina and Vitreous [facility]  I have reviewed the above documentation for accuracy and completeness, and I agree with the above. Gardiner Sleeper, M.D., Ph.D. 07/10/21 9:59 AM  Abbreviations: M myopia (nearsighted); A astigmatism; H hyperopia (farsighted); P presbyopia; Mrx spectacle prescription;  CTL contact lenses; OD right eye; OS left eye; OU both eyes  XT exotropia; ET esotropia; PEK punctate epithelial keratitis; PEE punctate epithelial erosions; DES dry eye syndrome; MGD meibomian gland dysfunction; ATs artificial tears; PFAT's preservative free artificial tears; [place] nuclear sclerotic cataract; PSC posterior subcapsular cataract; ERM epi-retinal membrane; PVD posterior vitreous detachment; RD retinal detachment; DM diabetes mellitus; DR diabetic retinopathy; NPDR non-proliferative diabetic retinopathy; PDR proliferative diabetic retinopathy; CSME  clinically significant macular edema; DME diabetic macular edema; dbh dot blot hemorrhages; CWS cotton wool spot; POAG primary open angle glaucoma; C/D cup-to-disc ratio; HVF humphrey visual field; GVF goldmann visual field; OCT optical coherence tomography; IOP intraocular pressure; BRVO Branch retinal vein occlusion; CRVO central retinal vein occlusion; CRAO central retinal artery occlusion; BRAO branch retinal artery occlusion; RT retinal tear; SB scleral buckle; PPV pars plana vitrectomy; VH Vitreous hemorrhage; PRP panretinal laser photocoagulation; IVK intravitreal kenalog; VMT vitreomacular traction; MH Macular hole;  NVD neovascularization of the disc; NVE neovascularization elsewhere; AREDS age related eye disease study; ARMD age related macular degeneration; POAG primary open angle glaucoma; EBMD epithelial/anterior basement membrane dystrophy; ACIOL anterior chamber intraocular lens; IOL intraocular lens; PCIOL posterior chamber intraocular lens; Phaco/IOL phacoemulsification with intraocular lens placement; [place] photorefractive keratectomy; LASIK laser assisted in situ keratomileusis; HTN hypertension; DM diabetes mellitus; COPD chronic obstructive pulmonary disease

## 2021-07-10 ENCOUNTER — Encounter: Payer: Self-pay | Admitting: Internal Medicine

## 2021-07-10 ENCOUNTER — Ambulatory Visit (INDEPENDENT_AMBULATORY_CARE_PROVIDER_SITE_OTHER): Payer: Commercial Managed Care - HMO | Admitting: Ophthalmology

## 2021-07-10 ENCOUNTER — Encounter (INDEPENDENT_AMBULATORY_CARE_PROVIDER_SITE_OTHER): Payer: Self-pay | Admitting: Ophthalmology

## 2021-07-10 ENCOUNTER — Ambulatory Visit (INDEPENDENT_AMBULATORY_CARE_PROVIDER_SITE_OTHER): Payer: Commercial Managed Care - HMO | Admitting: Internal Medicine

## 2021-07-10 ENCOUNTER — Other Ambulatory Visit: Payer: Self-pay

## 2021-07-10 VITALS — BP 122/66 | HR 70 | Temp 98.2°F | Resp 16 | Ht 65.0 in | Wt 179.0 lb

## 2021-07-10 DIAGNOSIS — I10 Essential (primary) hypertension: Secondary | ICD-10-CM

## 2021-07-10 DIAGNOSIS — E1122 Type 2 diabetes mellitus with diabetic chronic kidney disease: Secondary | ICD-10-CM

## 2021-07-10 DIAGNOSIS — I5022 Chronic systolic (congestive) heart failure: Secondary | ICD-10-CM | POA: Diagnosis not present

## 2021-07-10 DIAGNOSIS — Z961 Presence of intraocular lens: Secondary | ICD-10-CM | POA: Diagnosis not present

## 2021-07-10 DIAGNOSIS — Z794 Long term (current) use of insulin: Secondary | ICD-10-CM

## 2021-07-10 DIAGNOSIS — E113313 Type 2 diabetes mellitus with moderate nonproliferative diabetic retinopathy with macular edema, bilateral: Secondary | ICD-10-CM | POA: Diagnosis not present

## 2021-07-10 DIAGNOSIS — E1152 Type 2 diabetes mellitus with diabetic peripheral angiopathy with gangrene: Secondary | ICD-10-CM | POA: Diagnosis not present

## 2021-07-10 DIAGNOSIS — E119 Type 2 diabetes mellitus without complications: Secondary | ICD-10-CM | POA: Diagnosis not present

## 2021-07-10 DIAGNOSIS — H35033 Hypertensive retinopathy, bilateral: Secondary | ICD-10-CM | POA: Diagnosis not present

## 2021-07-10 DIAGNOSIS — N1832 Chronic kidney disease, stage 3b: Secondary | ICD-10-CM

## 2021-07-10 DIAGNOSIS — I480 Paroxysmal atrial fibrillation: Secondary | ICD-10-CM

## 2021-07-10 DIAGNOSIS — E118 Type 2 diabetes mellitus with unspecified complications: Secondary | ICD-10-CM

## 2021-07-10 LAB — HEMOGLOBIN A1C: Hgb A1c MFr Bld: 8.9 % — ABNORMAL HIGH (ref 4.6–6.5)

## 2021-07-10 LAB — TSH: TSH: 2.53 u[IU]/mL (ref 0.35–5.50)

## 2021-07-10 MED ORDER — FREESTYLE LIBRE 2 READER DEVI: 1.0000 | Freq: Every day | 5 refills | Status: DC

## 2021-07-10 MED ORDER — BEVACIZUMAB CHEMO INJECTION 1.25MG/0.05ML SYRINGE FOR KALEIDOSCOPE
1.2500 mg | INTRAVITREAL | Status: AC | PRN
  Administered 2021-07-10: 1.25 mg via INTRAVITREAL

## 2021-07-10 MED ORDER — INSULIN LISPRO (1 UNIT DIAL) 100 UNIT/ML (KWIKPEN): 10.0000 [IU] | PEN_INJECTOR | Freq: Three times a day (TID) | SUBCUTANEOUS | 1 refills | Status: DC

## 2021-07-10 MED ORDER — FREESTYLE LIBRE 2 SENSOR MISC: 1.0000 | Freq: Every day | 5 refills | Status: DC

## 2021-07-10 NOTE — Progress Notes (Signed)
This encounter was created in error - please disregard.

## 2021-07-10 NOTE — Progress Notes (Signed)
Subjective:  Patient ID: William [name], male    DOB: 03-Jun-1951  Age: 71 y.o. MRN: [id number]  CC: Diabetes, Hypertension, Atrial Fibrillation, and Congestive Heart Failure  This visit occurred during the SARS-CoV-2 public health emergency.  Safety protocols were in place, including screening questions prior to the visit, additional usage of staff PPE, and extensive cleaning of exam room while observing appropriate contact time as indicated for disinfecting solutions.    HPI William [name] presents for f/up -   He recently saw his nephrologist and was told that his kidney function has improved.  He denies polys.  He has had some constipation that responded well to Colace.  He denies chest pain, shortness of breath, palpitations, or edema.  Outpatient Medications Prior to Visit  Medication Sig Dispense Refill   ACCU-CHEK AVIVA PLUS test strip TEST 3 TIMES DAILY PRIOR TO MEALS AND AT BEDTIME 100 strip 11   Accu-Chek Softclix Lancets lancets 1 each by Other route 4 (four) times daily as needed for other. Use as instructed 200 each 5   acetaminophen (TYLENOL) 650 MG CR tablet Take 650 mg by mouth every 8 (eight) hours as needed for pain.     albuterol (PROVENTIL HFA;VENTOLIN HFA) 108 (90 Base) MCG/ACT inhaler Inhale 2 puffs into the lungs every 6 (six) hours as needed for wheezing or shortness of breath. 1 Inhaler 0   aspirin EC 81 MG tablet Take 1 tablet (81 mg total) by mouth daily. 30 tablet 3   Blood Glucose Monitoring Suppl (ACCU-CHEK AVIVA PLUS) w/Device KIT 1 each by Does not apply route 4 (four) times daily -  before meals and at bedtime. 1 kit 0   carvedilol (COREG) 3.125 MG tablet Take 1 tablet (3.125 mg total) by mouth 2 (two) times daily with a meal. 180 tablet 0   dapagliflozin propanediol (FARXIGA) 10 MG TABS tablet Take 1 tablet (10 mg total) by mouth daily before breakfast. 90 tablet 0   gabapentin (NEURONTIN) 300 MG capsule Take 1 capsule (300 mg total) by mouth 2 (two) times daily.  180 capsule 1   Glucagon (GVOKE HYPOPEN 2-PACK) 1 MG/0.2ML SOAJ Inject 1 Act into the skin daily as needed. 2 mL 5   Insulin Pen Needle 31G X 8 MM MISC 1 each by Does not apply route 4 (four) times daily. 100 each 12   LANTUS SOLOSTAR 100 UNIT/ML Solostar Pen ADMINISTER 55 UNITS UNDER THE SKIN DAILY 51 mL 0   pantoprazole (PROTONIX) 40 MG tablet Take 1 tablet (40 mg total) by mouth 2 (two) times daily before a meal. 180 tablet 0   pramipexole (MIRAPEX) 0.125 MG tablet TAKE 1 TABLET(0.125 MG) BY MOUTH AT BEDTIME 90 tablet 0   rosuvastatin (CRESTOR) 40 MG tablet TAKE 1 TABLET BY MOUTH EVERY DAY 90 tablet 3   torsemide (DEMADEX) 20 MG tablet TAKE 1 TABLET(20 MG) BY MOUTH 3 TIMES A WEEK 38 tablet 0   warfarin (COUMADIN) 5 MG tablet TAKE 1 TO 1 AND 1/2 TABLET BY MOUTH DAILY AS DIRECTED BY [facility] CLINIC 180 tablet 1   Continuous Blood Gluc Receiver (FREESTYLE LIBRE 2 READER) DEVI 1 Act by Does not apply route daily. 2 each 5   Continuous Blood Gluc Sensor (FREESTYLE LIBRE 2 SENSOR) MISC 1 Act by Does not apply route daily. 2 each 5   insulin lispro (HUMALOG KWIKPEN) 100 UNIT/ML KwikPen ADMINISTER UP TO 10 UNITS UNDER THE SKIN THREE TIMES DAILY AS DIRECTED PER SLIDING SCALE 27 mL 3  No facility-administered medications prior to visit.    ROS Review of Systems  Constitutional:  Negative for appetite change, diaphoresis, fatigue and unexpected weight change.  HENT: Negative.    Eyes:  Positive for visual disturbance.  Respiratory:  Negative for cough, chest tightness, shortness of breath and wheezing.   Cardiovascular:  Negative for chest pain, palpitations and leg swelling.  Gastrointestinal:  Positive for constipation. Negative for abdominal pain, nausea and vomiting.  Endocrine: Negative.  Negative for polydipsia, polyphagia and polyuria.  Genitourinary: Negative.   Musculoskeletal: Negative.  Negative for back pain and myalgias.  Skin: Negative.   Neurological: Negative.  Negative for  dizziness, weakness and light-headedness.  Hematological:  Negative for adenopathy. Does not bruise/bleed easily.  Psychiatric/Behavioral: Negative.     Objective:  BP 122/66 (BP Location: Left Arm, Patient Position: Sitting, Cuff Size: Large)    Pulse 70    Temp 98.2 F (36.8 C) (Oral)    Resp 16    Ht '5\' 5"'  (1.651 m)    Wt 179 lb (81.2 kg)    SpO2 97%    BMI 29.79 kg/m   BP Readings from Last 3 Encounters:  07/10/21 (!) 102/54  07/10/21 122/66  06/06/21 108/60    Wt Readings from Last 3 Encounters:  07/10/21 179 lb (81.2 kg)  07/10/21 179 lb (81.2 kg)  06/06/21 181 lb (82.1 kg)    Physical Exam Vitals reviewed.  HENT:     Nose: Nose normal.     Mouth/Throat:     Mouth: Mucous membranes are moist.  Eyes:     General: No scleral icterus.    Conjunctiva/sclera: Conjunctivae normal.  Cardiovascular:     Rate and Rhythm: Normal rate and regular rhythm.     Heart sounds: Murmur heard.  Systolic murmur is present with a grade of 1/6.    No gallop.  Pulmonary:     Effort: Pulmonary effort is normal.     Breath sounds: No stridor. No wheezing, rhonchi or rales.  Abdominal:     General: Abdomen is flat.     Palpations: There is no mass.     Tenderness: There is no abdominal tenderness. There is no guarding.  Musculoskeletal:        General: Normal range of motion.     Cervical back: Neck supple.     Right lower leg: No edema.     Left lower leg: No edema.  Skin:    General: Skin is warm and dry.  Neurological:     General: No focal deficit present.     Mental Status: He is alert.  Psychiatric:        Mood and Affect: Mood normal.    Lab Results  Component Value Date   WBC 6.7 11/22/2020   HGB 12.6 (L) 11/22/2020   HCT 36.5 (L) 11/22/2020   PLT 170.0 11/22/2020   GLUCOSE 266 (H) 11/22/2020   CHOL 88 11/22/2020   TRIG 173.0 (H) 11/22/2020   HDL 27.60 (L) 11/22/2020   LDLCALC 26 11/22/2020   ALT 15 11/22/2020   AST 17 11/22/2020   NA 139 11/22/2020   K 4.6  11/22/2020   CL 103 11/22/2020   CREATININE 1.91 (H) 11/22/2020   BUN 34 (H) 11/22/2020   CO2 28 11/22/2020   TSH 2.53 07/10/2021   PSA 3.68 11/22/2020   INR 2.1 07/11/2021   HGBA1C 8.9 (H) 07/10/2021   MICROALBUR 22.6 (H) 11/22/2020    VAS [place] ABI WITH/WO TBI  Result Date: 01/25/2021  LOWER EXTREMITY DOPPLER STUDY Patient Name:  DIONIS AUTRY  Date of Exam:   01/25/2021 Medical Rec #: [id number]    Accession #:    [id number] Date of Birth: 10/25/1950   Patient Gender: M Patient Age:   43Y Exam Location:  [facility] Procedure:      VAS [place] ABI WITH/WO TBI Referring Phys: [place] --------------------------------------------------------------------------------  Indications: Claudication, and peripheral artery disease. High Risk         Hypertension, hyperlipidemia, Diabetes, past history of Factors:          smoking. Other Factors: Patient states that once in awhile he will get a cramp at night                on his right anterior calf area. He still complains of                claudication symptoms in both legs. He states he can only walk                for about 20 minutes and starts feeling calf pain, bilaterally.                He states he has to sit down and rest.  Vascular Interventions: 05/2016-right distal SFA angioplasty. S/P                         transmetatarsal amputation of foot, right. Comparison Study: 08/2020-Right ABI as 0.92 and left 0.83 Performing Technologist: Wilkie Aye RVT  Examination Guidelines: A complete evaluation includes at minimum, Doppler waveform signals and systolic blood pressure reading at the level of bilateral brachial, anterior tibial, and posterior tibial arteries, when vessel segments are accessible. Bilateral testing is considered an integral part of a complete examination. Photoelectric Plethysmograph (PPG) waveforms and toe systolic pressure readings are included as required and additional duplex testing as needed. Limited examinations for  reoccurring indications may be performed as noted.  ABI Findings: +---------+------------------+-----+----------+--------------------------+  Right     Rt Pressure (mmHg) Index Waveform   Comment                     +---------+------------------+-----+----------+--------------------------+  Brachial  124                                                             +---------+------------------+-----+----------+--------------------------+  ATA       112                0.82  monophasic                             +---------+------------------+-----+----------+--------------------------+  PTA       111                0.82  monophasic                             +---------+------------------+-----+----------+--------------------------+  PERO      121                0.89  monophasic                             +---------+------------------+-----+----------+--------------------------+  Great Toe                                     Transmetatarsal amputation  +---------+------------------+-----+----------+--------------------------+ +---------+------------------+-----+-------------------+-------+  Left      Lt Pressure (mmHg) Index Waveform            Comment  +---------+------------------+-----+-------------------+-------+  Brachial  136                                                   +---------+------------------+-----+-------------------+-------+  ATA       116                0.85  dampened monophasic          +---------+------------------+-----+-------------------+-------+  PTA       115                0.85  monophasic                   +---------+------------------+-----+-------------------+-------+  PERO      113                0.83  monophasic                   +---------+------------------+-----+-------------------+-------+  Great Toe 74                 0.54  Abnormal                     +---------+------------------+-----+-------------------+-------+ +-------+-----------+-----------+------------+------------+   ABI/TBI Today's ABI Today's TBI Previous ABI Previous TBI  +-------+-----------+-----------+------------+------------+  Right   0.89                    0.92                       +-------+-----------+-----------+------------+------------+  Left    0.85        0.54        0.83         0.38          +-------+-----------+-----------+------------+------------+  Bilateral ABI's have not changed compared to prior exam however, the waveforms are now monophasic compared to last studies normal waveforms.  Summary: Right: Resting right ankle-brachial index indicates mild right lower extremity arterial disease. Left: Resting left ankle-brachial index indicates mild left lower extremity arterial disease. The left toe-brachial index is abnormal.  *See table(s) above for measurements and observations.  Suggest follow up study in 12 months. Electronically signed by Quay Burow MD on 01/25/2021 at 3:12:55 PM.    Final    VAS [place] LOWER EXTREMITY ARTERIAL DUPLEX  Result Date: 01/25/2021 LOWER EXTREMITY ARTERIAL DUPLEX STUDY Patient Name:  DEVANTA DANIEL  Date of Exam:   01/25/2021 Medical Rec #: [id number]    Accession #:    [id number] Date of Birth: [birth year]   Patient Gender: M Patient Age:   61Y Exam Location:  [facility] Procedure:      VAS [place] LOWER EXTREMITY ARTERIAL DUPLEX Referring Phys: [place] --------------------------------------------------------------------------------  Indications: Claudication, and peripheral artery disease. High Risk Factors: Hypertension, hyperlipidemia, Diabetes, past history of  smoking. Other Factors: Patient states that once in awhile he will get a cramp at night                on his right anterior calf area. He still complains of                claudication symptoms in both legs. He states he can only walk                for about 20 minutes and starts feeling calf pain, bilaterally.                He states he has to sit down and rest.  Vascular Interventions:  05/2016-right distal SFA angioplasty. S/P                         transmetatarsal amputation of foot, right. Current ABI:            Right 0.89; Left 0.85 Comparison Study: 08/2020-RIGHT:High end range 50-74% stenosis noted in the right                   proximal superficial femoral artery. LEFT: 30-49% stenosis                   noted in the common femoral artery. 30-49% stenosis noted in                   the superficial femoral artery. 30-49% stenosis noted in the                   popliteal artery. Total occlusion noted in the posterior                   tibial artery. Performing Technologist: Wilkie Aye RVT  Examination Guidelines: A complete evaluation includes B-mode imaging, spectral Doppler, color Doppler, and power Doppler as needed of all accessible portions of each vessel. Bilateral testing is considered an integral part of a complete examination. Limited examinations for reoccurring indications may be performed as noted.  +----------+--------+-----+---------------+--------+--------+  RIGHT      PSV cm/s Ratio Stenosis        Waveform Comments  +----------+--------+-----+---------------+--------+--------+  CFA Prox   167                            biphasic           +----------+--------+-----+---------------+--------+--------+  CFA Distal 138                            biphasic           +----------+--------+-----+---------------+--------+--------+  DFA        157                            biphasic           +----------+--------+-----+---------------+--------+--------+  SFA Prox   307      3.1   50-74% stenosis biphasic           +----------+--------+-----+---------------+--------+--------+  SFA Mid    165      1.8   30-49% stenosis biphasic           +----------+--------+-----+---------------+--------+--------+  SFA Distal 127                                               +----------+--------+-----+---------------+--------+--------+  POP Prox   161      1.5   30-49% stenosis biphasic            +----------+--------+-----+---------------+--------+--------+  POP Distal 57                             biphasic           +----------+--------+-----+---------------+--------+--------+  TP Trunk   97                             biphasic           +----------+--------+-----+---------------+--------+--------+ A focal velocity elevation of 307 cm/s was obtained at Distal SFA with a VR of 3.1. Findings are characteristic of 50-74% stenosis. A 2nd focal velocity elevation was visualized, measuring 165 cm/s at Mid SFA with a VR of 1.8. Findings are characteristic  of 30-49% stenosis. A 3rd focal velocity elevation was visualized, measuring 161 cm/s at Proximal popliteal with a VR of 1.5. Findings are characteristic of 30-49% stenosis.  +-----------+--------+-----+---------------+----------+-------------------+  LEFT        PSV cm/s Ratio Stenosis        Waveform   Comments             +-----------+--------+-----+---------------+----------+-------------------+  CFA Prox    178            30-49% stenosis biphasic                        +-----------+--------+-----+---------------+----------+-------------------+  CFA Distal  183            30-49% stenosis biphasic                        +-----------+--------+-----+---------------+----------+-------------------+  DFA         222                            biphasic   elevated velocities  +-----------+--------+-----+---------------+----------+-------------------+  SFA Prox    188      1.9   30-49% stenosis                                 +-----------+--------+-----+---------------+----------+-------------------+  SFA Mid     120                                                            +-----------+--------+-----+---------------+----------+-------------------+  SFA Distal  188      1.5   30-49% stenosis                                 +-----------+--------+-----+---------------+----------+-------------------+  POP Prox    245      4.3   75-99% stenosis biphasic                         +-----------+--------+-----+---------------+----------+-------------------+  POP Distal  70  monophasic                      +-----------+--------+-----+---------------+----------+-------------------+  TP Trunk    90                             monophasic                      +-----------+--------+-----+---------------+----------+-------------------+  ATA Prox    51                             monophasic                      +-----------+--------+-----+---------------+----------+-------------------+  PTA Mid                    occluded                                        +-----------+--------+-----+---------------+----------+-------------------+  PTA Distal  22                             monophasic                      +-----------+--------+-----+---------------+----------+-------------------+  PERO Distal 62                             monophasic                      +-----------+--------+-----+---------------+----------+-------------------+ A focal velocity elevation of 188 cm/s was obtained at Proximal SFA with a VR of 1.9. Findings are characteristic of 30-49% stenosis. A 2nd focal velocity elevation was visualized, measuring 188 cm/s at Distal SFA with a VR of 1.53. Findings are characteristic of 30-49% stenosis. A 3rd focal velocity elevation was visualized, measuring 245 cm/s at Proximal popliteal with a VR of 4.30. Findings are characteristic of 75-99% stenosis.  Summary: Right: No significant change compared to previous study. Atherosclerosis in the right lower extremity. 50-74% stenosis in the proximal SFA. 30-49% stenosis in the mid SFA and the popliteal artery. Left: Moderate progression in the popliteal artery is noted compared to previous study. Atherosclerosis in the left lower extremity. 30-49% stenosis in the common femoral artery. 30-49% stenosis in the superficial femoral artery. 75-99% stenosis in the proximal popliteal artery. The posterior tibial artery  appears to be occluded. Two vessel run off.  See table(s) above for measurements and observations. Suggest follow up study in 12 months. Vascular consult recommended. Electronically signed by Quay Burow MD on 01/25/2021 at 3:12:14 PM.    Final     Assessment & Plan:   William [name] was seen today for diabetes, hypertension, atrial fibrillation and congestive heart failure.  Diagnoses and all orders for this visit:  Essential hypertension- His blood pressure is adequately well controlled. -     TSH; Future -     TSH  Chronic systolic HF (heart failure) ([place])- He has a normal volume status. -     Consult to [facility]  Type 2 diabetes mellitus with diabetic peripheral angiopathy and gangrene, with long-term current use of insulin ([place]) -  Hemoglobin A1c; Future -     Hemoglobin A1c -     insulin lispro (HUMALOG KWIKPEN) 100 UNIT/ML KwikPen; Inject 10 Units into the skin 3 (three) times daily. ADMINISTER UP TO 10 UNITS UNDER THE SKIN THREE TIMES DAILY AS DIRECTED PER SLIDING SCALE  Paroxysmal atrial fibrillation ([place])- He has good rate and rhythm control. -     Hemoglobin A1c; Future -     Hemoglobin A1c  Insulin-requiring or dependent type II diabetes mellitus ([place])- His A1c is up to 8.9%.  I recommended better compliance with the mealtime insulin. -     insulin lispro (HUMALOG KWIKPEN) 100 UNIT/ML KwikPen; Inject 10 Units into the skin 3 (three) times daily. ADMINISTER UP TO 10 UNITS UNDER THE SKIN THREE TIMES DAILY AS DIRECTED PER SLIDING SCALE -     Continuous Blood Gluc Receiver (FREESTYLE LIBRE 2 READER) DEVI; 1 Act by Does not apply route daily. -     Continuous Blood Gluc Sensor (FREESTYLE LIBRE 2 SENSOR) MISC; 1 Act by Does not apply route daily. -     Consult to [facility]  Type 2 diabetes mellitus with complication, with long-term current use of insulin ([name])  Type 2 diabetes mellitus with stage 3b chronic kidney disease, without long-term current use of  insulin ([name])  Stage 3b chronic kidney disease ([place])- I await the results of his recent visit with nephrology. -     Consult to [facility]   I have changed William [name]'s insulin lispro. I am also having him maintain his aspirin EC, acetaminophen, albuterol, Accu-Chek Aviva Plus, Insulin Pen Needle, warfarin, rosuvastatin, Gvoke HypoPen 2-Pack, Accu-Chek Softclix Lancets, torsemide, gabapentin, Accu-Chek Aviva Plus, dapagliflozin propanediol, pramipexole, carvedilol, pantoprazole, Lantus SoloStar, FreeStyle Libre 2 Reader, and [employer] 2 Sensor.  Meds ordered this encounter  Medications   insulin lispro (HUMALOG KWIKPEN) 100 UNIT/ML KwikPen    Sig: Inject 10 Units into the skin 3 (three) times daily. ADMINISTER UP TO 10 UNITS UNDER THE SKIN THREE TIMES DAILY AS DIRECTED PER SLIDING SCALE    Dispense:  27 mL    Refill:  1   Continuous Blood Gluc Receiver (FREESTYLE LIBRE 2 READER) DEVI    Sig: 1 Act by Does not apply route daily.    Dispense:  2 each    Refill:  5   Continuous Blood Gluc Sensor (FREESTYLE LIBRE 2 SENSOR) MISC    Sig: 1 Act by Does not apply route daily.    Dispense:  2 each    Refill:  5     Follow-up: Return in about 3 months (around 10/08/2021).  Scarlette Calico, MD

## 2021-07-10 NOTE — Patient Instructions (Signed)
Type 2 Diabetes Mellitus, Diagnosis, Adult ?Type 2 diabetes (type 2 diabetes mellitus) is a long-term, or chronic, disease. In type 2 diabetes, one or both of these problems may be present: ?The pancreas does not make enough of a hormone called insulin. ?Cells in the body do not respond properly to the insulin that the body makes (insulin resistance). ?Normally, insulin allows blood sugar (glucose) to enter cells in the body. The cells use glucose for energy. Insulin resistance or lack of insulin causes excess glucose to build up in the blood instead of going into cells. This causes high blood glucose (hyperglycemia).  ?What are the causes? ?The exact cause of type 2 diabetes is not known. ?What increases the risk? ?The following factors may make you more likely to develop this condition: ?Having a family member with type 2 diabetes. ?Being overweight or obese. ?Being inactive (sedentary). ?Having been diagnosed with insulin resistance. ?Having a history of prediabetes, diabetes when you were pregnant (gestational diabetes), or polycystic ovary syndrome (PCOS). ?What are the signs or symptoms? ?In the early stage of this condition, you may not have symptoms. Symptoms develop slowly and may include: ?Increased thirst or hunger. ?Increased urination. ?Unexplained weight loss. ?Tiredness (fatigue) or weakness. ?Vision changes, such as blurry vision. ?Dark patches on the skin. ?How is this diagnosed? ?This condition is diagnosed based on your symptoms, your medical history, a physical exam, and your blood glucose level. Your blood glucose may be checked with one or more of the following blood tests: ?A fasting blood glucose (FBG) test. You will not be allowed to eat (you will fast) for 8 hours or longer before a blood sample is taken. ?A random blood glucose test. This test checks blood glucose at any time of day regardless of when you ate. ?An A1C (hemoglobin A1C) blood test. This test provides information about blood  glucose levels over the previous 2-3 months. ?An oral glucose tolerance test (OGTT). This test measures your blood glucose at two times: ?After fasting. This is your baseline blood glucose level. ?Two hours after drinking a beverage that contains glucose. ?You may be diagnosed with type 2 diabetes if: ?Your fasting blood glucose level is 126 mg/dL (7.0 mmol/L) or higher. ?Your random blood glucose level is 200 mg/dL (11.1 mmol/L) or higher. ?Your A1C level is 6.5% or higher. ?Your oral glucose tolerance test result is higher than 200 mg/dL (11.1 mmol/L). ?These blood tests may be repeated to confirm your diagnosis. ?How is this treated? ?Your treatment may be managed by a specialist called an endocrinologist. Type 2 diabetes may be treated by following instructions from your health care provider about: ?Making dietary and lifestyle changes. These may include: ?Following a personalized nutrition plan that is developed by a registered dietitian. ?Exercising regularly. ?Finding ways to manage stress. ?Checking your blood glucose level as often as told. ?Taking diabetes medicines or insulin daily. This helps to keep your blood glucose levels in the healthy range. ?Taking medicines to help prevent complications from diabetes. Medicines may include: ?Aspirin. ?Medicine to lower cholesterol. ?Medicine to control blood pressure. ?Your health care provider will set treatment goals for you. Your goals will be based on your age, other medical conditions you have, and how you respond to diabetes treatment. Generally, the goal of treatment is to maintain the following blood glucose levels: ?Before meals: 80-130 mg/dL (4.4-7.2 mmol/L). ?After meals: below 180 mg/dL (10 mmol/L). ?A1C level: less than 7%. ?Follow these instructions at home: ?Questions to ask your health care provider ?  Consider asking the following questions: ?Should I meet with a certified diabetes care and education specialist? ?What diabetes medicines do I need,  and when should I take them? ?What equipment will I need to manage my diabetes at home? ?How often do I need to check my blood glucose? ?Where can I find a support group for people with diabetes? ?What number can I call if I have questions? ?When is my next appointment? ?General instructions ?Take over-the-counter and prescription medicines only as told by your health care provider. ?Keep all follow-up visits. This is important. ?Where to find more information ?For help and guidance and for more information about diabetes, please visit: ?American Diabetes Association (ADA): www.diabetes.org ?American Association of Diabetes Care and Education Specialists (ADCES): www.diabeteseducator.org ?International Diabetes Federation (IDF): www.idf.org ?Contact a health care provider if: ?Your blood glucose is at or above 240 mg/dL (13.3 mmol/L) for 2 days in a row. ?You have been sick or have had a fever for 2 days or longer, and you are not getting better. ?You have any of the following problems for more than 6 hours: ?You cannot eat or drink. ?You have nausea and vomiting. ?You have diarrhea. ?Get help right away if: ?You have severe hypoglycemia. This means your blood glucose is lower than 54 mg/dL (3.0 mmol/L). ?You become confused or you have trouble thinking clearly. ?You have difficulty breathing. ?You have moderate or large ketone levels in your urine. ?These symptoms may represent a serious problem that is an emergency. Do not wait to see if the symptoms will go away. Get medical help right away. Call your local emergency services (911 in the [place] Do not drive yourself to the hospital. ?Summary ?Type 2 diabetes mellitus is a long-term, or chronic, disease. In type 2 diabetes, the pancreas does not make enough of a hormone called insulin, or cells in the body do not respond properly to insulin that the body makes. ?This condition is treated by making dietary and lifestyle changes and taking diabetes medicines or  insulin. ?Your health care provider will set treatment goals for you. Your goals will be based on your age, other medical conditions you have, and how you respond to diabetes treatment. ?Keep all follow-up visits. This is important. ?This information is not intended to replace advice given to you by your health care provider. Make sure you discuss any questions you have with your health care provider. ?Document Revised: 09/12/2020 Document Reviewed: 09/12/2020 ?Elsevier Patient Education ? 2022 Elsevier Inc. ? ?

## 2021-07-11 ENCOUNTER — Ambulatory Visit: Payer: Commercial Managed Care - HMO | Admitting: *Deleted

## 2021-07-11 ENCOUNTER — Telehealth: Payer: Self-pay | Admitting: *Deleted

## 2021-07-11 ENCOUNTER — Other Ambulatory Visit: Payer: Self-pay

## 2021-07-11 DIAGNOSIS — Z5181 Encounter for therapeutic drug level monitoring: Secondary | ICD-10-CM | POA: Diagnosis not present

## 2021-07-11 DIAGNOSIS — I5041 Acute combined systolic (congestive) and diastolic (congestive) heart failure: Secondary | ICD-10-CM

## 2021-07-11 DIAGNOSIS — I480 Paroxysmal atrial fibrillation: Secondary | ICD-10-CM | POA: Diagnosis not present

## 2021-07-11 LAB — POCT INR: INR: 2.1 (ref 2.0–3.0)

## 2021-07-11 NOTE — Patient Instructions (Signed)
Description   -Then Continue taking 1.5 tablets daily except for 1 tablet every Monday, Wednesday and Friday.  Recheck INR in 3 weeks.

## 2021-07-11 NOTE — Chronic Care Management (AMB) (Signed)
Chronic Care Management   Note  07/11/2021 Name: Tareek Sabo MRN: [id number] DOB: 06-Jun-1951  Rehan Holness is a 71 y.o. year old male who is a primary care patient of [name], MD. I reached out to [name] by phone today in response to a referral sent by Mr. Nyzaiah Kai PCP.  Mr. Roper was given information about Chronic Care Management services today including:  CCM service includes personalized support from designated clinical staff supervised by his physician, including individualized plan of care and coordination with other care providers 24/7 contact phone numbers for assistance for urgent and routine care needs. Service will only be billed when office clinical staff spend 20 minutes or more in a month to coordinate care. Only one practitioner may furnish and bill the service in a calendar month. The patient may stop CCM services at any time (effective at the end of the month) by phone call to the office staff. The patient is responsible for co-pay (up to 20% after annual deductible is met) if co-pay is required by the individual health plan.   Patient daughter Con Memos DPR on file did not agree to enrollment in care management services and does not wish to consider at this time.  Follow up plan: Patient daughter Con Memos declines engagement by the care management team. Appropriate care team members and provider have been notified via electronic communication. The care management team is available to follow up with the patient after provider conversation with the patient regarding recommendation for care management engagement and subsequent re-referral to the care management team.   [facility]  Direct Dial: [phone]

## 2021-07-11 NOTE — Patient Outreach (Signed)
[facility]) Care Management  07/11/2021  Javontay Vandam 1950/09/03 [id number]  Referral received from MD office for Care Management services to assess for diabetes and heart failure.  Assigned to embedded care coordinator in [facility] queue for Reginia Naas, Therapist, sports.  [facility] Management Assistant [phone]

## 2021-07-18 NOTE — Telephone Encounter (Signed)
Note not needed- opened in error

## 2021-07-20 ENCOUNTER — Encounter: Payer: Self-pay | Admitting: Internal Medicine

## 2021-07-22 ENCOUNTER — Other Ambulatory Visit: Payer: Self-pay | Admitting: Internal Medicine

## 2021-07-22 DIAGNOSIS — I5022 Chronic systolic (congestive) heart failure: Secondary | ICD-10-CM

## 2021-08-01 ENCOUNTER — Ambulatory Visit (INDEPENDENT_AMBULATORY_CARE_PROVIDER_SITE_OTHER): Payer: Medicare Other | Admitting: Cardiovascular Disease

## 2021-08-01 ENCOUNTER — Ambulatory Visit (INDEPENDENT_AMBULATORY_CARE_PROVIDER_SITE_OTHER): Payer: Medicare Other | Admitting: *Deleted

## 2021-08-01 ENCOUNTER — Other Ambulatory Visit: Payer: Self-pay

## 2021-08-01 VITALS — BP 136/62 | HR 72 | Ht 65.0 in | Wt 183.0 lb

## 2021-08-01 DIAGNOSIS — I2510 Atherosclerotic heart disease of native coronary artery without angina pectoris: Secondary | ICD-10-CM | POA: Diagnosis not present

## 2021-08-01 DIAGNOSIS — Z794 Long term (current) use of insulin: Secondary | ICD-10-CM | POA: Diagnosis not present

## 2021-08-01 DIAGNOSIS — I480 Paroxysmal atrial fibrillation: Secondary | ICD-10-CM

## 2021-08-01 DIAGNOSIS — E1152 Type 2 diabetes mellitus with diabetic peripheral angiopathy with gangrene: Secondary | ICD-10-CM

## 2021-08-01 DIAGNOSIS — I739 Peripheral vascular disease, unspecified: Secondary | ICD-10-CM | POA: Diagnosis not present

## 2021-08-01 DIAGNOSIS — E785 Hyperlipidemia, unspecified: Secondary | ICD-10-CM | POA: Diagnosis not present

## 2021-08-01 DIAGNOSIS — I5022 Chronic systolic (congestive) heart failure: Secondary | ICD-10-CM

## 2021-08-01 LAB — POCT INR: INR: 2.2 (ref 2.0–3.0)

## 2021-08-01 NOTE — Patient Instructions (Signed)

## 2021-08-01 NOTE — Patient Instructions (Signed)
Description   -Continue taking 1.5 tablets daily except for 1 tablet every Monday, Wednesday and Friday.  Recheck INR in 4 weeks.

## 2021-08-01 NOTE — Progress Notes (Signed)
Cardiology Office Note   Date:  08/01/2021   ID:  Darious Rehman, DOB 1951/07/02, MRN [id number]  PCP:  [name], MD  Cardiologist:   [name], MD   No chief complaint on file.      History of Present Illness: William [name] is a 71 y.o. male who is here today for a follow-up visit regarding peripheral arterial disease, coronary artery disease and paroxysmal atrial fibrillation. He has known history of inferior myocardial infarction complicated by VSD in September, 2017. He is status post one-vessel CABG and VSD repair. He had postoperative atrial fibrillation and has been on anticoagulation. He had amputation of the right great toe while hospitalized for gangrene. The patient has known history of diabetes.  He is known to have peripheral arterial disease. Angiography in November, 2017 showed no significant aortoiliac disease, significant right distal SFA stenosis and one-vessel runoff below the knee via the peroneal artery with reconstitution of the dorsalis pedis distally. I performed successful drug-coated balloon angioplasty of the right SFA. He is s/p right transmetatarsal amputation for osteomyelitis.    He had angiography to the left leg in March 2018 which showed moderate stenosis affecting the left popliteal artery and TP trunk with 1 vessel runoff below the knee via the peroneal artery which was a large dominant vessel and reconstituted the dorsalis pedis and posterior tibial at the level of the ankle. There was brisk flow to the toes. Thus, no revascularization was performed.  Most recent vascular studies in July of last year showed an ABI of 0.89 on the right and 0.85 on the left.  Duplex on the right showed moderately elevated velocity in the proximal SFA.  On the left, there was moderately elevated velocity in the popliteal artery.  He has been doing reasonably well with no chest pain, shortness of breath or lower extremity claudication.    Past Medical History:   Diagnosis Date   AKI (acute kidney injury) ([place])    With STEMI in 5366   Chronic systolic CHF (congestive heart failure) ([place])    Diabetes mellitus without complication ([place])    Diabetic retinopathy ([place])    Hx of adenomatous colonic polyps 04/07/2018   Hyperlipidemia    Hypertension    Hypertensive retinopathy    Paroxysmal atrial fibrillation ([name])    STEMI (ST elevation myocardial infarction) ([place]) 2017    Past Surgical History:  Procedure Laterality Date   ABDOMINAL AORTOGRAM W/LOWER EXTREMITY N/A 09/19/2016   Procedure: Abdominal Aortogram w/Lower Extremity;  Surgeon: Wellington Hampshire, MD;  Location: [place] [facility];  Service: Cardiovascular;  Laterality: N/A;   AMPUTATION Right 03/23/2016   Procedure: 1st and 2nd Ray Amputation Right Foot;  Surgeon: Newt Minion, MD;  Location: [facility] [facility];  Service: Orthopedics;  Laterality: Right;   AMPUTATION Right 06/21/2016   Procedure: RIGHT TRANSMETATARSAL AMPUTATION;  Surgeon: Newt Minion, MD;  Location: [facility];  Service: Orthopedics;  Laterality: Right;   CARDIAC CATHETERIZATION N/A 03/13/2016   Procedure: Right/Left Heart Cath and Coronary Angiography;  Surgeon: Sherren Mocha, MD;  Location: [place] [facility];  Service: Cardiovascular;  Laterality: N/A;   CARDIAC CATHETERIZATION N/A 03/13/2016   Procedure: IABP Insertion;  Surgeon: Sherren Mocha, MD;  Location: [facility];  Service: Cardiovascular;  Laterality: N/A;   CATARACT EXTRACTION     CATARACT EXTRACTION, BILATERAL     CERVICAL FUSION  1982, 1992   has had 3 neck surgeries from breaking his neck  CORONARY ARTERY BYPASS GRAFT N/A 03/13/2016   Procedure: CORONARY ARTERY BYPASS GRAFTING (CABG) x 1 (SVG to OM) with EVH from [place];  Surgeon: Ivin Poot, MD;  Location: [facility];  Service: Open Heart Surgery;  Laterality: N/A;   EYE SURGERY     LOWER EXTREMITY ANGIOGRAM  05/02/2016   Procedure: Lower Extremity Angiogram;  Surgeon: Wellington Hampshire, MD;  Location: [place];  Service: Cardiovascular;;  Limited left femoral runoff right femoral runoff   PERIPHERAL VASCULAR CATHETERIZATION N/A 05/02/2016   Procedure: Abdominal Aortogram;  Surgeon: Wellington Hampshire, MD;  Location: [place] [facility];  Service: Cardiovascular;  Laterality: N/A;   PERIPHERAL VASCULAR CATHETERIZATION Right 05/02/2016   Procedure: Peripheral Vascular Balloon Angioplasty;  Surgeon: Wellington Hampshire, MD;  Location: [place] [facility];  Service: Cardiovascular;  Laterality: Right;  SFA   TEE WITHOUT CARDIOVERSION N/A 03/13/2016   Procedure: TRANSESOPHAGEAL ECHOCARDIOGRAM (TEE);  Surgeon: Ivin Poot, MD;  Location: [facility];  Service: Open Heart Surgery;  Laterality: N/A;   VSD REPAIR N/A 03/13/2016   Procedure: VENTRICULAR SEPTAL DEFECT (VSD) REPAIR;  Surgeon: Ivin Poot, MD;  Location: [facility];  Service: Open Heart Surgery;  Laterality: N/A;     Current Outpatient Medications  Medication Sig Dispense Refill   ACCU-CHEK AVIVA PLUS test strip TEST 3 TIMES DAILY PRIOR TO MEALS AND AT BEDTIME 100 strip 11   albuterol (PROVENTIL HFA;VENTOLIN HFA) 108 (90 Base) MCG/ACT inhaler Inhale 2 puffs into the lungs every 6 (six) hours as needed for wheezing or shortness of breath. 1 Inhaler 0   aspirin EC 81 MG tablet Take 1 tablet (81 mg total) by mouth daily. 30 tablet 3   carvedilol (COREG) 3.125 MG tablet Take 1 tablet (3.125 mg total) by mouth 2 (two) times daily with a meal. 180 tablet 0   Continuous Blood Gluc Receiver (FREESTYLE LIBRE 2 READER) DEVI 1 Act by Does not apply route daily. 2 each 5   Continuous Blood Gluc Sensor (FREESTYLE LIBRE 2 SENSOR) MISC 1 Act by Does not apply route daily. 2 each 5   dapagliflozin propanediol (FARXIGA) 10 MG TABS tablet Take 1 tablet (10 mg total) by mouth daily before breakfast. 90 tablet 0   gabapentin (NEURONTIN) 300 MG capsule Take 1 capsule (300 mg total) by mouth 2 (two) times daily. 180 capsule 1   insulin  lispro (HUMALOG KWIKPEN) 100 UNIT/ML KwikPen Inject 10 Units into the skin 3 (three) times daily. ADMINISTER UP TO 10 UNITS UNDER THE SKIN THREE TIMES DAILY AS DIRECTED PER SLIDING SCALE 27 mL 1   Insulin Pen Needle 31G X 8 MM MISC 1 each by Does not apply route 4 (four) times daily. 100 each 12   LANTUS SOLOSTAR 100 UNIT/ML Solostar Pen ADMINISTER 55 UNITS UNDER THE SKIN DAILY 51 mL 0   pantoprazole (PROTONIX) 40 MG tablet Take 1 tablet (40 mg total) by mouth 2 (two) times daily before a meal. 180 tablet 0   pramipexole (MIRAPEX) 0.125 MG tablet TAKE 1 TABLET(0.125 MG) BY MOUTH AT BEDTIME 90 tablet 0   rosuvastatin (CRESTOR) 40 MG tablet TAKE 1 TABLET BY MOUTH EVERY DAY 90 tablet 3   torsemide (DEMADEX) 20 MG tablet TAKE 1 TABLET(20 MG) BY MOUTH 3 TIMES A WEEK 38 tablet 0   warfarin (COUMADIN) 5 MG tablet TAKE 1 TO 1 AND 1/2 TABLET BY MOUTH DAILY AS DIRECTED BY COUMADIN CLINIC 180 tablet 1   Accu-Chek  Softclix Lancets lancets 1 each by Other route 4 (four) times daily as needed for other. Use as instructed (Patient not taking: Reported on 08/01/2021) 200 each 5   acetaminophen (TYLENOL) 650 MG CR tablet Take 650 mg by mouth every 8 (eight) hours as needed for pain. (Patient not taking: Reported on 08/01/2021)     Blood Glucose Monitoring Suppl (ACCU-CHEK AVIVA PLUS) w/Device KIT 1 each by Does not apply route 4 (four) times daily -  before meals and at bedtime. (Patient not taking: Reported on 08/01/2021) 1 kit 0   Glucagon (GVOKE HYPOPEN 2-PACK) 1 MG/0.2ML SOAJ Inject 1 Act into the skin daily as needed. (Patient not taking: Reported on 08/01/2021) 2 mL 5   No current facility-administered medications for this visit.    Allergies:   Morphine and related and Latex    Social History:  The patient  reports that he quit smoking about 5 years ago. His smoking use included cigarettes. He smoked an average of .5 packs per day. He has never used smokeless tobacco. He reports that he does not drink alcohol  and does not use drugs.   Family History:  Not able to obtain due to distress.  ROS:  Please see the history of present illness.   Otherwise, review of systems are positive for none.   All other systems are reviewed and negative.    PHYSICAL EXAM: VS:  BP 136/62 (BP Location: Left Arm, Patient Position: Sitting, Cuff Size: Small)    Pulse 72    Ht 5' 5" (1.651 m)    Wt 183 lb (83 kg)    SpO2 99%    BMI 30.45 kg/m  , BMI Body mass index is 30.45 kg/m. GEN: Well nourished, well developed, in no acute distress  HEENT: normal  Neck: no JVD, carotid bruits, or masses Cardiac: RRR; no  rubs, or gallops,no edema . No murmurs Respiratory:  clear to auscultation bilaterally, normal work of breathing GI: soft, nontender, nondistended, + BS MS: no deformity or atrophy  Skin: warm and dry, no rash Neuro:  Strength and sensation are intact Psych: euthymic mood, full affect Vascular: Distal pulses are not palpable.  No ulceration.  EKG:  EKG is  ordered today. EKG showed normal sinus rhythm with prior inferior infarct.  Recent Labs: 11/22/2020: ALT 15; BUN 34; Creatinine, Ser 1.91; Hemoglobin 12.6; Platelets 170.0; Potassium 4.6; Sodium 139 07/10/2021: TSH 2.53    Lipid Panel    Component Value Date/Time   CHOL 88 11/22/2020 1619   CHOL 90 (L) 01/05/2019 1123   TRIG 173.0 (H) 11/22/2020 1619   HDL 27.60 (L) 11/22/2020 1619   HDL 34 (L) 01/05/2019 1123   CHOLHDL 3 11/22/2020 1619   VLDL 34.6 11/22/2020 1619   LDLCALC 26 11/22/2020 1619   LDLCALC 24 01/05/2019 1123   LDLCALC 45 03/20/2017 1144      Wt Readings from Last 3 Encounters:  08/01/21 183 lb (83 kg)  07/10/21 179 lb (81.2 kg)  07/10/21 179 lb (81.2 kg)      No flowsheet data found.     ASSESSMENT AND PLAN:  1.  Peripheral arterial disease :  He is status post drug-coated balloon angioplasty of the right SFA in 2017. He has significant tibial disease bilaterally. Currently he has no ulceration or claudication.  Continue medical therapy. Most recent Doppler showed stable ABI and stable disease overall.  2. Chronic systolic heart failure: He appears to be euvolemic.   He is no longer on  losartan due to low blood pressure and chronic kidney disease.  Most recent echo in 2018 showed an EF of 45 to 50%.  He appears to be euvolemic on current dose of torsemide 20 mg 3 times a week.  I asked him to continue the same dose.  Continue small dose carvedilol and [place].  3. Paroxysmal atrial fibrillation: He is maintaining in sinus rhythm.  He is on long-term anticoagulation with warfarin with no bleeding complications.  His INR will be checked today.  4. Coronary artery disease involving native coronary arteries without angina:  status post one-vessel CABG and VSD repair. Continue medical therapy.  5. Diabetes mellitus: Improved from before.  6. Hyperlipidemia: I reviewed most recent lipid profile done in May which showed an LDL of 26 and triglyceride of 173.  I instructed him to continue rosuvastatin 40 mg daily.    Disposition:   follow-up with me in 6 months  Signed,  [name], MD  08/01/2021 1:17 PM    [place]

## 2021-08-07 NOTE — Progress Notes (Signed)
[facility] Note  08/08/2021     CHIEF COMPLAINT Patient presents for Retina Follow Up  HISTORY OF PRESENT ILLNESS: William [name] is a 71 y.o. male who presents to the clinic today for:   HPI     Retina Follow Up   Patient presents with  Diabetic Retinopathy.  In both eyes.  This started 4 months ago.  I, the attending physician,  performed the HPI with the patient and updated documentation appropriately.        Comments   Patient here for 4 weeks retina follow up for NPDR OU. Patient states vision doing OK. No eye pain.       Last edited by [name], MD on 08/08/2021 11:38 PM.     Pt states vision is okay  Referring physician: [name], MD [place],  [place] [id number]  HISTORICAL INFORMATION:  Selected notes from the MEDICAL RECORD NUMBER Referred by Dr. [name] for eval of DME OU LEE:  Ocular Hx- PMH-    CURRENT MEDICATIONS: No current outpatient medications on file. (Ophthalmic Drugs)   No current facility-administered medications for this visit. (Ophthalmic Drugs)   Current Outpatient Medications (Other)  Medication Sig   ACCU-CHEK AVIVA PLUS test strip TEST 3 TIMES DAILY PRIOR TO MEALS AND AT BEDTIME   albuterol (PROVENTIL HFA;VENTOLIN HFA) 108 (90 Base) MCG/ACT inhaler Inhale 2 puffs into the lungs every 6 (six) hours as needed for wheezing or shortness of breath.   aspirin EC 81 MG tablet Take 1 tablet (81 mg total) by mouth daily.   carvedilol (COREG) 3.125 MG tablet Take 1 tablet (3.125 mg total) by mouth 2 (two) times daily with a meal.   Continuous Blood Gluc Receiver (FREESTYLE LIBRE 2 READER) DEVI 1 Act by Does not apply route daily.   Continuous Blood Gluc Sensor (FREESTYLE LIBRE 2 SENSOR) MISC 1 Act by Does not apply route daily.   dapagliflozin propanediol (FARXIGA) 10 MG TABS tablet Take 1 tablet (10 mg total) by mouth daily before breakfast.   gabapentin (NEURONTIN) 300 MG capsule Take 1 capsule  (300 mg total) by mouth 2 (two) times daily.   insulin lispro (HUMALOG KWIKPEN) 100 UNIT/ML KwikPen Inject 10 Units into the skin 3 (three) times daily. ADMINISTER UP TO 10 UNITS UNDER THE SKIN THREE TIMES DAILY AS DIRECTED PER SLIDING SCALE   Insulin Pen Needle 31G X 8 MM MISC 1 each by Does not apply route 4 (four) times daily.   LANTUS SOLOSTAR 100 UNIT/ML Solostar Pen ADMINISTER 55 UNITS UNDER THE SKIN DAILY   pantoprazole (PROTONIX) 40 MG tablet Take 1 tablet (40 mg total) by mouth 2 (two) times daily before a meal.   pramipexole (MIRAPEX) 0.125 MG tablet TAKE 1 TABLET(0.125 MG) BY MOUTH AT BEDTIME   rosuvastatin (CRESTOR) 40 MG tablet TAKE 1 TABLET BY MOUTH EVERY DAY   torsemide (DEMADEX) 20 MG tablet TAKE 1 TABLET(20 MG) BY MOUTH 3 TIMES A WEEK   warfarin (COUMADIN) 5 MG tablet TAKE 1 TO 1 AND 1/2 TABLET BY MOUTH DAILY AS DIRECTED BY COUMADIN CLINIC   Accu-Chek Softclix Lancets lancets 1 each by Other route 4 (four) times daily as needed for other. Use as instructed (Patient not taking: Reported on 08/01/2021)   acetaminophen (TYLENOL) 650 MG CR tablet Take 650 mg by mouth every 8 (eight) hours as needed for pain. (Patient not taking: Reported on 08/01/2021)   Blood Glucose Monitoring Suppl (ACCU-CHEK AVIVA PLUS)  w/Device KIT 1 each by Does not apply route 4 (four) times daily -  before meals and at bedtime. (Patient not taking: Reported on 08/01/2021)   Glucagon (GVOKE HYPOPEN 2-PACK) 1 MG/0.2ML SOAJ Inject 1 Act into the skin daily as needed. (Patient not taking: Reported on 08/01/2021)   No current facility-administered medications for this visit. (Other)   REVIEW OF SYSTEMS: ROS   Positive for: Gastrointestinal, Genitourinary, Endocrine, Eyes Negative for: Constitutional, Neurological, Skin, Musculoskeletal, HENT, Cardiovascular, Respiratory, Psychiatric, Allergic/Imm, Heme/Lymph Last edited by [name], COA on 08/08/2021  2:44 PM.     ALLERGIES Allergies  Allergen Reactions    Morphine And Related Shortness Of Breath and Other (See Comments)    UNSPECIFIED REACTION "Pt said it was too much"    Latex Rash   PAST MEDICAL HISTORY Past Medical History:  Diagnosis Date   AKI (acute kidney injury) ([place])    With STEMI in 0175   Chronic systolic CHF (congestive heart failure) ([place])    Diabetes mellitus without complication ([place])    Diabetic retinopathy ([place])    Hx of adenomatous colonic polyps 04/07/2018   Hyperlipidemia    Hypertension    Hypertensive retinopathy    Paroxysmal atrial fibrillation ([name])    STEMI (ST elevation myocardial infarction) ([place]) 2017   Past Surgical History:  Procedure Laterality Date   ABDOMINAL AORTOGRAM W/LOWER EXTREMITY N/A 09/19/2016   Procedure: Abdominal Aortogram w/Lower Extremity;  Surgeon: Wellington Hampshire, MD;  Location: [facility];  Service: Cardiovascular;  Laterality: N/A;   AMPUTATION Right 03/23/2016   Procedure: 1st and 2nd Ray Amputation Right Foot;  Surgeon: Newt Minion, MD;  Location: [facility];  Service: Orthopedics;  Laterality: Right;   AMPUTATION Right 06/21/2016   Procedure: RIGHT TRANSMETATARSAL AMPUTATION;  Surgeon: Newt Minion, MD;  Location: [facility];  Service: Orthopedics;  Laterality: Right;   CARDIAC CATHETERIZATION N/A 03/13/2016   Procedure: Right/Left Heart Cath and Coronary Angiography;  Surgeon: Sherren Mocha, MD;  Location: [facility];  Service: Cardiovascular;  Laterality: N/A;   CARDIAC CATHETERIZATION N/A 03/13/2016   Procedure: IABP Insertion;  Surgeon: Sherren Mocha, MD;  Location: [facility];  Service: Cardiovascular;  Laterality: N/A;   CATARACT EXTRACTION     CATARACT EXTRACTION, BILATERAL     CERVICAL FUSION  1982, 1992   has had 3 neck surgeries from breaking his neck   CORONARY ARTERY BYPASS GRAFT N/A 03/13/2016   Procedure: CORONARY ARTERY BYPASS GRAFTING (CABG) x 1 (SVG to OM) with EVH from [place];  Surgeon: Ivin Poot, MD;  Location:  [facility];  Service: Open Heart Surgery;  Laterality: N/A;   EYE SURGERY     LOWER EXTREMITY ANGIOGRAM  05/02/2016   Procedure: Lower Extremity Angiogram;  Surgeon: Wellington Hampshire, MD;  Location: [facility];  Service: Cardiovascular;;  Limited left femoral runoff right femoral runoff   PERIPHERAL VASCULAR CATHETERIZATION N/A 05/02/2016   Procedure: Abdominal Aortogram;  Surgeon: Wellington Hampshire, MD;  Location: [facility];  Service: Cardiovascular;  Laterality: N/A;   PERIPHERAL VASCULAR CATHETERIZATION Right 05/02/2016   Procedure: Peripheral Vascular Balloon Angioplasty;  Surgeon: Wellington Hampshire, MD;  Location: [facility];  Service: Cardiovascular;  Laterality: Right;  SFA   TEE WITHOUT CARDIOVERSION N/A 03/13/2016   Procedure: TRANSESOPHAGEAL ECHOCARDIOGRAM (TEE);  Surgeon: Ivin Poot, MD;  Location: [facility];  Service: Open Heart Surgery;  Laterality: N/A;   VSD  REPAIR N/A 03/13/2016   Procedure: VENTRICULAR SEPTAL DEFECT (VSD) REPAIR;  Surgeon: Ivin Poot, MD;  Location: [facility];  Service: Open Heart Surgery;  Laterality: N/A;   FAMILY HISTORY Family History  Problem Relation Age of Onset   Diabetes Maternal Grandmother    Diabetes Mother    Aneurysm Mother    Peripheral Artery Disease Mother    Coronary artery disease Mother    Peptic Ulcer Father    Retinoblastoma Daughter    Colon cancer Neg Hx    Rectal cancer Neg Hx    SOCIAL HISTORY Social History   Tobacco Use   Smoking status: Former    Packs/day: 0.50    Types: Cigarettes    Quit date: 11/20/2015    Years since quitting: 5.7   Smokeless tobacco: Never   Tobacco comments:    quit 2018  Vaping Use   Vaping Use: Never used  Substance Use Topics   Alcohol use: No   Drug use: No       OPHTHALMIC EXAM: Base Eye Exam     Visual Acuity (Snellen - Linear)       Right Left   Dist [place] 20/25 -1 20/30 -1   Dist ph [place] NI 20/25 -1         Tonometry (Tonopen, 2:42 PM)       Right  Left   Pressure 12 12         Pupils       Dark Light Shape React APD   Right 2 1 Round Minimal None   Left 2 1 Round Minimal None         Visual Fields (Counting fingers)       Left Right    Full Full         Extraocular Movement       Right Left    Full, Ortho Full, Ortho         Neuro/Psych     Oriented x3: Yes   Mood/Affect: Normal         Dilation     Both eyes: 1.0% Mydriacyl, 2.5% Phenylephrine @ 2:41 PM           Slit Lamp and Fundus Exam     Slit Lamp Exam       Right Left   Lids/Lashes Dermatochalasis - upper lid, mild MGD Dermatochalasis - upper lid, mild MGD   Conjunctiva/Sclera White and quiet White and quiet   Cornea trace PEE trace PEE   Anterior Chamber Deep and quiet Deep and quiet   Iris Round and dilated, No NVI Round and dilated, No NVI   Lens PC IOL in good position PC IOL in good position   Anterior Vitreous Vitreous syneresis, vitreous condensations Vitreous syneresis, Posterior vitreous detachment, vitreous condensations         Fundus Exam       Right Left   Disc Pink and Sharp mild Pallor, Sharp rim, mild tilt   C/D Ratio 0.3 0.3   Macula Flat, Blunted foveal reflex, scatted Microaneurysms/DBH - improved, scatterd Cystic changes/edema greatest superior mac -- slightly improved Blunted foveal reflex, central IRF/edema - slightly improved, +MA/DBH -- improved   Vessels mild attenuation, mild tortuousity, no NV attenuated, Tortuous, mild Copper wiring   Periphery Attached, +MA/DBH greatest posteriorly Attached, scattered MA/DBH greatest posteriorly           IMAGING AND PROCEDURES  Imaging and Procedures for 08/08/2021  OCT, Retina - OU - Both  Eyes       Right Eye Quality was good. Central Foveal Thickness: 233. Progression has improved. Findings include abnormal foveal contour, intraretinal fluid, no SRF, intraretinal hyper-reflective material (persistent IRF -- mild improvement).   Left Eye Quality was  good. Central Foveal Thickness: 315. Progression has improved. Findings include abnormal foveal contour, intraretinal fluid, intraretinal hyper-reflective material, no SRF (Mild interval improvement in IRF/IRHM).   Notes *Images captured and stored on drive  Diagnosis / Impression:  +DME OU OD: persistent IRF -- mild improvement OS: Mild interval improvement in IRF/IRHM  Clinical management:  See below  Abbreviations: NFP - Normal foveal profile. CME - cystoid macular edema. PED - pigment epithelial detachment. IRF - intraretinal fluid. SRF - subretinal fluid. EZ - ellipsoid zone. ERM - epiretinal membrane. ORA - outer retinal atrophy. ORT - outer retinal tubulation. SRHM - subretinal hyper-reflective material. IRHM - intraretinal hyper-reflective material      Intravitreal Injection, Pharmacologic Agent - OD - Right Eye       Time Out 08/08/2021. 3:37 PM. Confirmed correct patient, procedure, site, and patient consented.   Anesthesia Topical anesthesia was used. Anesthetic medications included Lidocaine 2%, Proparacaine 0.5%.   Procedure Preparation included 5% betadine to ocular surface, eyelid speculum. A supplied (32g) needle was used.   Injection: 1.25 mg Bevacizumab 1.58m/0.05ml   Route: Intravitreal, Site: Right Eye   NDC:: 47829-562-13 Lot: [id number] , Expiration date: 09/13/2021   Post-op Post injection exam found visual acuity of at least counting fingers. The patient tolerated the procedure well. There were no complications. The patient received written and verbal post procedure care education. Post injection medications were not given.      Intravitreal Injection, Pharmacologic Agent - OS - Left Eye       Time Out 08/08/2021. 3:38 PM. Confirmed correct patient, procedure, site, and patient consented.   Anesthesia Topical anesthesia was used. Anesthetic medications included Lidocaine 2%, Proparacaine 0.5%.   Procedure Preparation included 5% betadine to ocular  surface, eyelid speculum. A (32g) needle was used.   Injection: 1.25 mg Bevacizumab 1.215m0.05ml   Route: Intravitreal, Site: Left Eye   NDC: : 08657-846-96Lot: [id number], Expiration date: 09/11/2021   Post-op Post injection exam found visual acuity of at least counting fingers. The patient tolerated the procedure well. There were no complications. The patient received written and verbal post procedure care education. Post injection medications were not given.             ASSESSMENT/PLAN:    ICD-10-CM   1. Moderate nonproliferative diabetic retinopathy of both eyes with macular edema associated with type 2 diabetes mellitus ([name])  E11.3313 OCT, Retina - OU - Both Eyes    Intravitreal Injection, Pharmacologic Agent - OD - Right Eye    Intravitreal Injection, Pharmacologic Agent - OS - Left Eye    Bevacizumab (AVASTIN) SOLN 1.25 mg    Bevacizumab (AVASTIN) SOLN 1.25 mg    2. Essential hypertension  I10     3. Hypertensive retinopathy of both eyes  H35.033     4. Pseudophakia, both eyes  Z96.1       1. Moderate Non-proliferative diabetic retinopathy, both eyes  - s/p IVA OD #1 (11.14.22), #2 (12.12.22), #3 (01.09.23) - s/p IVA OS #1 (10.17.22), #2 (11.14.22), #3 (12.12.22), #4 (01.09.23) - exam shows scattered MA, DBH OU; - FA (10.17.22) shows late leaking MA OU, no NV OU - OCT shows OD: persistent IRF -- mild improvement; OS: Mild interval improvement in IRF/IRHM - BCVA 20/20 OD,  20/25 OS (improved OU) - recommend IVA OU (OD #4 and OS #5) today, 01.09.23 for DME  - pt wishes to proceed - RBA of procedure discussed, questions answered - informed consent obtained and signed - see procedure note - f/u in 5-6 wks -- DFE/OCT, possible injection(s)  2,3. Hypertensive retinopathy OU - discussed importance of tight BP control - monitor  4. Pseudophakia OU  - s/p CE/IOL  - IOL in good position, doing well  - monitor  Ophthalmic Meds Ordered this visit:  Meds ordered this  encounter  Medications   Bevacizumab (AVASTIN) SOLN 1.25 mg   Bevacizumab (AVASTIN) SOLN 1.25 mg     Return for 5-6 wks - DME f/u, Dilated Exam, OCT, Possible Injxn.  There are no Patient Instructions on file for this visit.  Explained the diagnoses, plan, and follow up with the patient and they expressed understanding.  Patient expressed understanding of the importance of proper follow up care.   This document serves as a record of services personally performed by Gardiner Sleeper, MD, PhD. It was created on their behalf by San Jetty. Owens Shark, OA an ophthalmic technician. The creation of this record is the provider's dictation and/or activities during the visit.    Electronically signed by: San Jetty. Owens Shark, [place] 02.06.2023 11:51 PM   Gardiner Sleeper, M.D., Ph.D. Diseases & Surgery of the Retina and Vitreous [facility]  I have reviewed the above documentation for accuracy and completeness, and I agree with the above. Gardiner Sleeper, M.D., Ph.D. 08/08/21 11:51 PM   Abbreviations: M myopia (nearsighted); A astigmatism; H hyperopia (farsighted); P presbyopia; Mrx spectacle prescription;  CTL contact lenses; OD right eye; OS left eye; OU both eyes  XT exotropia; ET esotropia; PEK punctate epithelial keratitis; PEE punctate epithelial erosions; DES dry eye syndrome; MGD meibomian gland dysfunction; ATs artificial tears; PFAT's preservative free artificial tears; [place] nuclear sclerotic cataract; PSC posterior subcapsular cataract; ERM epi-retinal membrane; PVD posterior vitreous detachment; RD retinal detachment; DM diabetes mellitus; DR diabetic retinopathy; NPDR non-proliferative diabetic retinopathy; PDR proliferative diabetic retinopathy; CSME clinically significant macular edema; DME diabetic macular edema; dbh dot blot hemorrhages; CWS cotton wool spot; POAG primary open angle glaucoma; C/D cup-to-disc ratio; HVF humphrey visual field; GVF goldmann visual field; OCT optical  coherence tomography; IOP intraocular pressure; BRVO Branch retinal vein occlusion; CRVO central retinal vein occlusion; CRAO central retinal artery occlusion; BRAO branch retinal artery occlusion; RT retinal tear; SB scleral buckle; PPV pars plana vitrectomy; VH Vitreous hemorrhage; PRP panretinal laser photocoagulation; IVK intravitreal kenalog; VMT vitreomacular traction; MH Macular hole;  NVD neovascularization of the disc; NVE neovascularization elsewhere; AREDS age related eye disease study; ARMD age related macular degeneration; POAG primary open angle glaucoma; EBMD epithelial/anterior basement membrane dystrophy; ACIOL anterior chamber intraocular lens; IOL intraocular lens; PCIOL posterior chamber intraocular lens; Phaco/IOL phacoemulsification with intraocular lens placement; [place] photorefractive keratectomy; LASIK laser assisted in situ keratomileusis; HTN hypertension; DM diabetes mellitus; COPD chronic obstructive pulmonary disease

## 2021-08-08 ENCOUNTER — Encounter (INDEPENDENT_AMBULATORY_CARE_PROVIDER_SITE_OTHER): Payer: Self-pay | Admitting: Ophthalmology

## 2021-08-08 ENCOUNTER — Ambulatory Visit (INDEPENDENT_AMBULATORY_CARE_PROVIDER_SITE_OTHER): Payer: Medicare Other | Admitting: Ophthalmology

## 2021-08-08 ENCOUNTER — Other Ambulatory Visit: Payer: Self-pay

## 2021-08-08 DIAGNOSIS — I10 Essential (primary) hypertension: Secondary | ICD-10-CM

## 2021-08-08 DIAGNOSIS — Z961 Presence of intraocular lens: Secondary | ICD-10-CM | POA: Diagnosis not present

## 2021-08-08 DIAGNOSIS — E113313 Type 2 diabetes mellitus with moderate nonproliferative diabetic retinopathy with macular edema, bilateral: Secondary | ICD-10-CM

## 2021-08-08 DIAGNOSIS — H35033 Hypertensive retinopathy, bilateral: Secondary | ICD-10-CM

## 2021-08-08 MED ORDER — BEVACIZUMAB CHEMO INJECTION 1.25MG/0.05ML SYRINGE FOR KALEIDOSCOPE
1.2500 mg | INTRAVITREAL | Status: AC | PRN
  Administered 2021-08-08: 1.25 mg via INTRAVITREAL

## 2021-08-27 ENCOUNTER — Other Ambulatory Visit: Payer: Self-pay | Admitting: Internal Medicine

## 2021-08-27 DIAGNOSIS — I2510 Atherosclerotic heart disease of native coronary artery without angina pectoris: Secondary | ICD-10-CM

## 2021-08-27 DIAGNOSIS — I5022 Chronic systolic (congestive) heart failure: Secondary | ICD-10-CM

## 2021-08-27 DIAGNOSIS — I10 Essential (primary) hypertension: Secondary | ICD-10-CM

## 2021-08-27 DIAGNOSIS — I2583 Coronary atherosclerosis due to lipid rich plaque: Secondary | ICD-10-CM

## 2021-08-28 ENCOUNTER — Other Ambulatory Visit: Payer: Self-pay

## 2021-08-28 ENCOUNTER — Other Ambulatory Visit: Payer: Self-pay | Admitting: Internal Medicine

## 2021-08-28 ENCOUNTER — Ambulatory Visit (INDEPENDENT_AMBULATORY_CARE_PROVIDER_SITE_OTHER): Payer: Medicare Other

## 2021-08-28 DIAGNOSIS — Z5181 Encounter for therapeutic drug level monitoring: Secondary | ICD-10-CM | POA: Diagnosis not present

## 2021-08-28 DIAGNOSIS — G2581 Restless legs syndrome: Secondary | ICD-10-CM

## 2021-08-28 DIAGNOSIS — K219 Gastro-esophageal reflux disease without esophagitis: Secondary | ICD-10-CM

## 2021-08-28 DIAGNOSIS — I480 Paroxysmal atrial fibrillation: Secondary | ICD-10-CM

## 2021-08-28 DIAGNOSIS — N1832 Chronic kidney disease, stage 3b: Secondary | ICD-10-CM

## 2021-08-28 DIAGNOSIS — E1122 Type 2 diabetes mellitus with diabetic chronic kidney disease: Secondary | ICD-10-CM

## 2021-08-28 DIAGNOSIS — E118 Type 2 diabetes mellitus with unspecified complications: Secondary | ICD-10-CM

## 2021-08-28 DIAGNOSIS — I5022 Chronic systolic (congestive) heart failure: Secondary | ICD-10-CM

## 2021-08-28 LAB — POCT INR: INR: 1.9 — AB (ref 2.0–3.0)

## 2021-08-28 MED ORDER — WARFARIN SODIUM 5 MG PO TABS: ORAL_TABLET | ORAL | 1 refills | Status: DC

## 2021-08-28 NOTE — Patient Instructions (Signed)
TAKE 1.5 TABLETS TONIGHT ONLY and then Continue taking 1.5 tablets daily except for 1 tablet every Monday, Wednesday and Friday.  Recheck INR in 4 weeks.

## 2021-08-29 ENCOUNTER — Other Ambulatory Visit: Payer: Self-pay | Admitting: Internal Medicine

## 2021-08-29 DIAGNOSIS — I5022 Chronic systolic (congestive) heart failure: Secondary | ICD-10-CM

## 2021-08-29 DIAGNOSIS — E1122 Type 2 diabetes mellitus with diabetic chronic kidney disease: Secondary | ICD-10-CM

## 2021-08-29 DIAGNOSIS — E118 Type 2 diabetes mellitus with unspecified complications: Secondary | ICD-10-CM

## 2021-08-29 DIAGNOSIS — G2581 Restless legs syndrome: Secondary | ICD-10-CM

## 2021-08-29 DIAGNOSIS — N1832 Chronic kidney disease, stage 3b: Secondary | ICD-10-CM

## 2021-08-29 DIAGNOSIS — K219 Gastro-esophageal reflux disease without esophagitis: Secondary | ICD-10-CM

## 2021-09-06 NOTE — Progress Notes (Shared)
[facility] Note  09/13/2021     CHIEF COMPLAINT Patient presents for No chief complaint on file.  HISTORY OF PRESENT ILLNESS: William [name] is a 71 y.o. male who presents to the clinic today for:      Referring physician: [name], MD [place],  [place] [id number]  HISTORICAL INFORMATION:  Selected notes from the MEDICAL RECORD NUMBER Referred by Dr. [name] for eval of DME OU LEE:  Ocular Hx- PMH-    CURRENT MEDICATIONS: No current outpatient medications on file. (Ophthalmic Drugs)   No current facility-administered medications for this visit. (Ophthalmic Drugs)   Current Outpatient Medications (Other)  Medication Sig   ACCU-CHEK AVIVA PLUS test strip TEST 3 TIMES DAILY PRIOR TO MEALS AND AT BEDTIME   Accu-Chek Softclix Lancets lancets 1 each by Other route 4 (four) times daily as needed for other. Use as instructed (Patient not taking: Reported on 08/01/2021)   acetaminophen (TYLENOL) 650 MG CR tablet Take 650 mg by mouth every 8 (eight) hours as needed for pain. (Patient not taking: Reported on 08/01/2021)   albuterol (PROVENTIL HFA;VENTOLIN HFA) 108 (90 Base) MCG/ACT inhaler Inhale 2 puffs into the lungs every 6 (six) hours as needed for wheezing or shortness of breath.   aspirin EC 81 MG tablet Take 1 tablet (81 mg total) by mouth daily.   Blood Glucose Monitoring Suppl (ACCU-CHEK AVIVA PLUS) w/Device KIT 1 each by Does not apply route 4 (four) times daily -  before meals and at bedtime. (Patient not taking: Reported on 08/01/2021)   carvedilol (COREG) 3.125 MG tablet TAKE 1 TABLET(3.125 MG) BY MOUTH TWICE DAILY WITH A MEAL   Continuous Blood Gluc Receiver (FREESTYLE LIBRE 2 READER) DEVI 1 Act by Does not apply route daily.   Continuous Blood Gluc Sensor (FREESTYLE LIBRE 2 SENSOR) MISC 1 Act by Does not apply route daily.   FARXIGA 10 MG TABS tablet TAKE 1 TABLET(10 MG) BY MOUTH DAILY BEFORE BREAKFAST   gabapentin (NEURONTIN)  300 MG capsule Take 1 capsule (300 mg total) by mouth 2 (two) times daily.   Glucagon (GVOKE HYPOPEN 2-PACK) 1 MG/0.2ML SOAJ Inject 1 Act into the skin daily as needed. (Patient not taking: Reported on 08/01/2021)   insulin lispro (HUMALOG KWIKPEN) 100 UNIT/ML KwikPen Inject 10 Units into the skin 3 (three) times daily. ADMINISTER UP TO 10 UNITS UNDER THE SKIN THREE TIMES DAILY AS DIRECTED PER SLIDING SCALE   Insulin Pen Needle 31G X 8 MM MISC 1 each by Does not apply route 4 (four) times daily.   LANTUS SOLOSTAR 100 UNIT/ML Solostar Pen ADMINISTER 55 UNITS UNDER THE SKIN DAILY   pantoprazole (PROTONIX) 40 MG tablet TAKE 1 TABLET(40 MG) BY MOUTH TWICE DAILY BEFORE A MEAL   pramipexole (MIRAPEX) 0.125 MG tablet TAKE 1 TABLET(0.125 MG) BY MOUTH AT BEDTIME   rosuvastatin (CRESTOR) 40 MG tablet TAKE 1 TABLET BY MOUTH EVERY DAY   torsemide (DEMADEX) 20 MG tablet TAKE 1 TABLET(20 MG) BY MOUTH 3 TIMES A WEEK   warfarin (COUMADIN) 5 MG tablet Take 1-2 tablets Daily or as prescribed by [facility].   No current facility-administered medications for this visit. (Other)   REVIEW OF SYSTEMS:   ALLERGIES Allergies  Allergen Reactions   Morphine And Related Shortness Of Breath and Other (See Comments)    UNSPECIFIED REACTION "Pt said it was too much"    Latex Rash   PAST MEDICAL HISTORY Past Medical History:  Diagnosis Date   AKI (acute kidney injury) ([name])    With STEMI in 2017   Chronic systolic CHF (congestive heart failure) ([name])    Diabetes mellitus without complication ([name])    Diabetic retinopathy ([name])    Hx of adenomatous colonic polyps 04/07/2018   Hyperlipidemia    Hypertension    Hypertensive retinopathy    Paroxysmal atrial fibrillation ([name])    STEMI (ST elevation myocardial infarction) ([name]) 2017   Past Surgical History:  Procedure Laterality Date   ABDOMINAL AORTOGRAM W/LOWER EXTREMITY N/A 09/19/2016   Procedure: Abdominal Aortogram w/Lower Extremity;  Surgeon:  Iran Ouch, MD;  Location: [facility] IN[facility]IVE [facility];  Service: Cardiovascular;  Laterality: N/A;   AMPUTATION Right 03/23/2016   Procedure: 1st and 2nd Ray Amputation Right Foot;  Surgeon: Nadara Mustard, MD;  Location: [facility] OR;  Service: Orthopedics;  Laterality: Right;   AMPUTATION Right 06/21/2016   Procedure: RIGHT TRANSMETATARSAL AMPUTATION;  Surgeon: Nadara Mustard, MD;  Location: [facility] OR;  Service: Orthopedics;  Laterality: Right;   CARDIAC CATHETERIZATION N/A 03/13/2016   Procedure: Right/Left Heart Cath and Coronary Angiography;  Surgeon: Tonny Bollman, MD;  Location: [facility] INVAS[facility] CV LAB;  Service: Cardiovascular;  Laterality: N/A;   CARDIAC CATHETERIZATION N/A 03/13/2016   Procedure: IABP Insertion;  Surgeon: Tonny Bollman, MD;  Location: [facility] INVASIVE CV LAB;  Service: Cardiovascular;  Laterality: N/A;   CATARACT EXTRACTION     CATARACT EXTRACTION, BILATERAL     CERVICAL FUSION  1982, 1992   has had 3 neck surgeries from breaking his neck   CORONARY ARTERY BYPASS GRAFT N/A 03/13/2016   Procedure: CORONARY ARTERY BYPASS GRAFTING (CABG) x 1 (SVG to OM) with EVH from LEFT GREATER SAPHENOUS VEIN;  Surgeon: Kerin Perna, MD;  Location: [facility] OR;  Service: Open Heart Surgery;  Laterality: N/A;   EYE SURGERY     LOWER EXTREMITY ANGIOGRAM  05/02/2016   Procedure: Lower Extremity Angiogram;  Surgeon: Iran Ouch, MD;  Location: [facility];  Service: Cardiovascular;;  Limited left femoral runoff right femoral runoff   PERIPHERAL VASCULAR CATHETERIZATION N/A 05/02/2016   Procedure: Abdominal Aortogram;  Surgeon: Iran Ouch, MD;  [facility]: [facility];  Service: Cardiovascular;  Laterality: N/A;   PERIPHERAL VASCULAR CATHETERIZATION Right 05/02/2016   Procedure: Peripheral Vascular Balloon Angioplasty;  Surgeon: Iran Ouch, MD;  [facility]: [facility];  Service: Cardiovascular;  Laterality: Right;  SFA   TEE WITHOUT CARDIOVERSION N/A 03/13/2016   Procedure:  TRANSESOPHAGEAL ECHOCARDIOGRAM (TEE);  Surgeon: Kerin Perna, MD;  Location[facility] OR;  Service: Open Heart Surgery;  Laterality: N/A;   VSD REPAIR N/A 03/13/2016   Procedure: VENTRICULAR SEPTAL DEFECT (VSD) REPAIR;  Surgeon: Kerin Perna, MD;  Location: [facility] OR;  Service: Open Heart Surgery;  Laterality: N/A;   FAMILY HISTORY Family History  Problem Relation Age of Onset   Diabetes Maternal Grandmother    Diabetes Mother    Aneurysm Mother    Peripheral Artery Disease Mother    Coronary artery disease Mother    Peptic Ulcer Father    Retinoblastoma Daughter    Colon cancer Neg Hx    Rectal cancer Neg Hx    SOCIAL HISTORY Social History   Tobacco Use   Smoking status: Former    Packs/day: 0.50    Types: Cigarettes    Quit date: 11/20/2015    Years since quitting: 5.8   Smokeless tobacco: Never   Tobacco comments:  quit 2018  Vaping Use   Vaping Use: Never used  Substance Use Topics   Alcohol use: No   Drug use: No       OPHTHALMIC EXAM: Not recorded    IMAGING AND PROCEDURES  Imaging and Procedures for 09/13/2021           ASSESSMENT/PLAN:  No diagnosis found.   1. Moderate Non-proliferative diabetic retinopathy, both eyes  - s/p IVA OD #1 (11.14.22), #2 (12.12.22), #3 (01.09.23) - s/p IVA OS #1 (10.17.22), #2 (11.14.22), #3 (12.12.22), #4 (01.09.23) - exam shows scattered MA, DBH OU; - FA (10.17.22) shows late leaking MA OU, no NV OU - OCT shows OD: persistent IRF -- mild improvement; OS: Mild interval improvement in IRF/IRHM - BCVA 20/20 OD, 20/25 OS (improved OU) - recommend IVA OU (OD #4 and OS #5) today, 03.15.23 for DME  - pt wishes to proceed - RBA of procedure discussed, questions answered - informed consent obtained and signed - see procedure note - f/u in 5-6 wks -- DFE/OCT, possible injection(s)  2,3. Hypertensive retinopathy OU - discussed importance of tight BP control - monitor  4. Pseudophakia OU  - s/p CE/IOL  - IOL in good  position, doing well  - monitor  Ophthalmic Meds Ordered this visit:  No orders of the defined types were placed in this encounter.    No follow-ups on file.  There are no Patient Instructions on file for this visit.  Explained the diagnoses, plan, and follow up with the patient and they expressed understanding.  Patient expressed understanding of the importance of proper follow up care.   This document serves as a record of services personally performed by Gardiner Sleeper, MD, PhD. It was created on their behalf by Orvan Falconer, an ophthalmic technician. The creation of this record is the provider's dictation and/or activities during the visit.    Electronically signed by: Orvan Falconer, OA, 09/06/21  10:31 AM    Gardiner Sleeper, M.D., Ph.D. Diseases & Surgery of the Retina and Vitreous [facility]  I have reviewed the above documentation for accuracy and completeness, and I agree with the above. Gardiner Sleeper, M.D., Ph.D. 08/08/21 10:31 AM   Abbreviations: M myopia (nearsighted); A astigmatism; H hyperopia (farsighted); P presbyopia; Mrx spectacle prescription;  CTL contact lenses; OD right eye; OS left eye; OU both eyes  XT exotropia; ET esotropia; PEK punctate epithelial keratitis; PEE punctate epithelial erosions; DES dry eye syndrome; MGD meibomian gland dysfunction; ATs artificial tears; PFAT's preservative free artificial tears; [place] nuclear sclerotic cataract; PSC posterior subcapsular cataract; ERM epi-retinal membrane; PVD posterior vitreous detachment; RD retinal detachment; DM diabetes mellitus; DR diabetic retinopathy; NPDR non-proliferative diabetic retinopathy; PDR proliferative diabetic retinopathy; CSME clinically significant macular edema; DME diabetic macular edema; dbh dot blot hemorrhages; CWS cotton wool spot; POAG primary open angle glaucoma; C/D cup-to-disc ratio; HVF humphrey visual field; GVF goldmann visual field; OCT optical  coherence tomography; IOP intraocular pressure; BRVO Branch retinal vein occlusion; CRVO central retinal vein occlusion; CRAO central retinal artery occlusion; BRAO branch retinal artery occlusion; RT retinal tear; SB scleral buckle; PPV pars plana vitrectomy; VH Vitreous hemorrhage; PRP panretinal laser photocoagulation; IVK intravitreal kenalog; VMT vitreomacular traction; MH Macular hole;  NVD neovascularization of the disc; NVE neovascularization elsewhere; AREDS age related eye disease study; ARMD age related macular degeneration; POAG primary open angle glaucoma; EBMD epithelial/anterior basement membrane dystrophy; ACIOL anterior chamber intraocular lens; IOL intraocular lens; PCIOL posterior chamber intraocular  lens; Phaco/IOL phacoemulsification with intraocular lens placement; [place] photorefractive keratectomy; LASIK laser assisted in situ keratomileusis; HTN hypertension; DM diabetes mellitus; COPD chronic obstructive pulmonary disease

## 2021-09-13 ENCOUNTER — Other Ambulatory Visit: Payer: Self-pay

## 2021-09-13 ENCOUNTER — Ambulatory Visit (INDEPENDENT_AMBULATORY_CARE_PROVIDER_SITE_OTHER): Payer: Medicare Other | Admitting: Ophthalmology

## 2021-09-13 ENCOUNTER — Encounter (INDEPENDENT_AMBULATORY_CARE_PROVIDER_SITE_OTHER): Payer: Self-pay | Admitting: Ophthalmology

## 2021-09-13 DIAGNOSIS — I10 Essential (primary) hypertension: Secondary | ICD-10-CM

## 2021-09-13 DIAGNOSIS — E113313 Type 2 diabetes mellitus with moderate nonproliferative diabetic retinopathy with macular edema, bilateral: Secondary | ICD-10-CM | POA: Diagnosis not present

## 2021-09-13 DIAGNOSIS — Z961 Presence of intraocular lens: Secondary | ICD-10-CM

## 2021-09-13 DIAGNOSIS — H35033 Hypertensive retinopathy, bilateral: Secondary | ICD-10-CM

## 2021-09-13 MED ORDER — AFLIBERCEPT 2MG/0.05ML IZ SOLN FOR KALEIDOSCOPE
2.0000 mg | INTRAVITREAL | Status: AC | PRN
  Administered 2021-09-13: 2 mg via INTRAVITREAL

## 2021-09-13 MED ORDER — BEVACIZUMAB CHEMO INJECTION 1.25MG/0.05ML SYRINGE FOR KALEIDOSCOPE
1.2500 mg | INTRAVITREAL | Status: AC | PRN
  Administered 2021-09-13: 1.25 mg via INTRAVITREAL

## 2021-09-25 ENCOUNTER — Other Ambulatory Visit: Payer: Self-pay

## 2021-09-25 ENCOUNTER — Ambulatory Visit (INDEPENDENT_AMBULATORY_CARE_PROVIDER_SITE_OTHER): Payer: Medicare Other

## 2021-09-25 DIAGNOSIS — I480 Paroxysmal atrial fibrillation: Secondary | ICD-10-CM

## 2021-09-25 DIAGNOSIS — Z5181 Encounter for therapeutic drug level monitoring: Secondary | ICD-10-CM | POA: Diagnosis not present

## 2021-09-25 LAB — POCT INR: INR: 2 (ref 2.0–3.0)

## 2021-09-25 NOTE — Patient Instructions (Signed)
Continue taking 1.5 tablets daily except for 1 tablet every Monday, Wednesday and Friday.  Recheck INR in 6 weeks.  ?

## 2021-09-27 ENCOUNTER — Ambulatory Visit (INDEPENDENT_AMBULATORY_CARE_PROVIDER_SITE_OTHER): Payer: Medicare Other

## 2021-09-27 ENCOUNTER — Ambulatory Visit (INDEPENDENT_AMBULATORY_CARE_PROVIDER_SITE_OTHER): Payer: Medicare Other | Admitting: Family

## 2021-09-27 ENCOUNTER — Encounter: Payer: Self-pay | Admitting: Family

## 2021-09-27 DIAGNOSIS — L97521 Non-pressure chronic ulcer of other part of left foot limited to breakdown of skin: Secondary | ICD-10-CM | POA: Diagnosis not present

## 2021-09-27 DIAGNOSIS — M795 Residual foreign body in soft tissue: Secondary | ICD-10-CM

## 2021-09-27 NOTE — Progress Notes (Signed)
? ?Office Visit Note ?  ?Patient: William [name]           ?Date of Birth: 1950-08-09           ?MRN: [id number] ?Visit Date: 09/27/2021 ?             ?Requested by: [name], MD ?[place],  [place] [address] ?PCP: [name], MD ? ?Chief Complaint  ?Patient presents with  ? Right Foot - Pain  ? Left Foot - Pain  ? ? ? ? ?HPI: ?The patient is a 71 year [name] gentleman seen in follow-up for plantar ulceration to his left foot which is painful.  He was seen in December of last year for the same and a foreign body was removed from the same area he has had return of his pain and callus has not had any drainage or open area no fever no chills ? ?Is status post limb salvage surgery on the right ? ?Assessment & Plan: ?Visit Diagnoses:  ?1. Non-pressure chronic ulcer of other part of left foot limited to breakdown of skin ([place])   ?2. Foreign body (FB) in soft tissue   ? ? ?Plan: Radiographs reassuring.  No foreign body seen.  ?Discussed corn pad, offered donuts for shoe wear. Has custom orthotics. Offered order for new ones.  ? ?Follow-Up Instructions: No follow-ups on file.  ? ?Ortho Exam ? ?Patient is alert, oriented, no adenopathy, well-dressed, normal affect, normal respiratory effort. ?On examination of the left foot he does have a palpable dorsalis pedis pulse there is callused ulceration beneath the third metatarsal head this was debrided with a 10 blade knife back to viable tissue there was a corn which was removed callused area is 1 cm in diameter this is 3 mm deep there is no drainage no erythema no odor ? ?Imaging: ?No results found. ?No images are attached to the encounter. ? ?Labs: ?Lab Results  ?Component Value Date  ? HGBA1C 8.9 (H) 07/10/2021  ? HGBA1C 8.2 (H) 11/22/2020  ? HGBA1C 7.3 (A) 09/19/2020  ? REPTSTATUS 06/26/2016 FINAL 06/21/2016  ? GRAMSTAIN  03/14/2016  ?  RARE WBC PRESENT, PREDOMINANTLY PMN ?NO ORGANISMS SEEN ?  ? CULT NO GROWTH 5 DAYS 06/21/2016  ? LABORGA PSEUDOMONAS  AERUGINOSA 03/14/2016  ? ? ? ?Lab Results  ?Component Value Date  ? ALBUMIN 4.2 11/22/2020  ? ALBUMIN 4.3 01/05/2019  ? ALBUMIN 4.6 03/10/2018  ? ? ?Lab Results  ?Component Value Date  ? MG 2.3 06/22/2016  ? MG 2.2 03/30/2016  ? MG 1.7 03/23/2016  ? ?No results found for: VD25OH ? ?No results found for: PREALBUMIN ? ?  Latest Ref Rng & Units 11/22/2020  ?  4:19 PM 09/01/2019  ? 11:28 AM 03/10/2018  ? 11:56 AM  ?CBC EXTENDED  ?WBC 4.0 - 10.5 K/uL 6.7   6.4   6.3    ?RBC 4.22 - 5.81 Mil/uL 4.24   4.19   4.19    ?Hemoglobin 13.0 - 17.0 g/dL 12.6   12.6   12.3    ?HCT 39.0 - 52.0 % 36.5   36.1   37.6    ?Platelets 150.0 - 400.0 K/uL 170.0   147   153    ?NEUT# 1.4 - 7.7 K/uL 4.2    4.0    ?Lymph# 0.7 - 4.0 K/uL 1.8    1.5    ? ? ? ?There is no height or weight on file to calculate BMI. ? ?Orders:  ?Orders  Placed This Encounter  ?Procedures  ? XR Foot 2 Views Left  ? ?No orders of the defined types were placed in this encounter. ? ? ? Procedures: ?No procedures performed ? ?Clinical Data: ?No additional findings. ? ?ROS: ? ?All other systems negative, except as noted in the HPI. ?Review of Systems ? ?Objective: ?Vital Signs: There were no vitals taken for this visit. ? ?Specialty Comments:  ?No specialty comments available. ? ?PMFS History: ?Patient Active Problem List  ? Diagnosis Date Noted  ? Encounter for general adult medical examination with abnormal findings 11/28/2020  ? Deficiency anemia 11/22/2020  ? Insulin-requiring or dependent type II diabetes mellitus ([place]) 11/22/2020  ? Hyperlipidemia LDL goal <70 11/22/2020  ? Tobacco dependence 03/24/2017  ? Type 2 diabetes mellitus with stage 3b chronic kidney disease, without long-term current use of insulin ([place]) 09/17/2016  ? Peripheral vascular disorder ([place]) 09/17/2016  ? CKD (chronic kidney disease) stage 3, GFR 30-59 ml/min ([name])   ? Chronic systolic HF (heart failure) ([place]) 05/28/2016  ? Coronary artery disease due to lipid rich plaque 03/16/2016  ? VSD (ventricular  septal defect) 03/16/2016  ? Paroxysmal atrial fibrillation ([place]) 03/15/2016  ? Erectile dysfunction associated with type 2 diabetes mellitus ([place]) 04/07/2012  ? Mood disorder ([place]) 03/12/2011  ? Essential hypertension 03/17/2010  ? GERD 11/01/2008  ? Type 2 diabetes mellitus with diabetic peripheral angiopathy and gangrene, with long-term current use of insulin ([place]) 09/23/2008  ? ?Past Medical History:  ?Diagnosis Date  ? AKI (acute kidney injury) ([place])   ? With STEMI in 2017  ? Chronic systolic CHF (congestive heart failure) ([place])   ? Diabetes mellitus without complication ([place])   ? Diabetic retinopathy ([place])   ? Hx of adenomatous colonic polyps 04/07/2018  ? Hyperlipidemia   ? Hypertension   ? Hypertensive retinopathy   ? Paroxysmal atrial fibrillation ([name])   ? STEMI (ST elevation myocardial infarction) ([place]) 2017  ?  ?Family History  ?Problem Relation Age of Onset  ? Diabetes Maternal Grandmother   ? Diabetes Mother   ? Aneurysm Mother   ? Peripheral Artery Disease Mother   ? Coronary artery disease Mother   ? Peptic Ulcer Father   ? Retinoblastoma Daughter   ? Colon cancer Neg Hx   ? Rectal cancer Neg Hx   ?  ?Past Surgical History:  ?Procedure Laterality Date  ? ABDOMINAL AORTOGRAM W/LOWER EXTREMITY N/A 09/19/2016  ? Procedure: Abdominal Aortogram w/Lower Extremity;  Surgeon: Wellington Hampshire, MD;  Location: [place] [facility] LAB;  Service: Cardiovascular;  Laterality: N/A;  ? AMPUTATION Right 03/23/2016  ? Procedure: 1st and 2nd Ray Amputation Right Foot;  Surgeon: Newt Minion, MD;  Location: [place];  Service: Orthopedics;  Laterality: Right;  ? AMPUTATION Right 06/21/2016  ? Procedure: RIGHT TRANSMETATARSAL AMPUTATION;  Surgeon: Newt Minion, MD;  Location: [place];  Service: Orthopedics;  Laterality: Right;  ? CARDIAC CATHETERIZATION N/A 03/13/2016  ? Procedure: Right/Left Heart Cath and Coronary Angiography;  Surgeon: Sherren Mocha, MD;  Location: [place] CV LAB;  Service: Cardiovascular;  Laterality:  N/A;  ? CARDIAC CATHETERIZATION N/A 03/13/2016  ? Procedure: IABP Insertion;  Surgeon: Sherren Mocha, MD;  Location: [place] CV LAB;  Service: Cardiovascular;  Laterality: N/A;  ? CATARACT EXTRACTION    ? CATARACT EXTRACTION, BILATERAL    ? [place]  ? has had 3 neck surgeries from breaking his neck  ? CORONARY ARTERY BYPASS GRAFT N/A 03/13/2016  ?  Procedure: CORONARY ARTERY BYPASS GRAFTING (CABG) x 1 (SVG to OM) with EVH from [place];  Surgeon: Ivin Poot, MD;  Location: [place];  Service: Open Heart Surgery;  Laterality: N/A;  ? EYE SURGERY    ? LOWER EXTREMITY ANGIOGRAM  05/02/2016  ? Procedure: Lower Extremity Angiogram;  Surgeon: Wellington Hampshire, MD;  Location: [place] [facility] LAB;  Service: Cardiovascular;;  Limited left femoral runoff ?right femoral runoff  ? PERIPHERAL VASCULAR CATHETERIZATION N/A 05/02/2016  ? Procedure: Abdominal Aortogram;  Surgeon: Wellington Hampshire, MD;  Location: [place] CV LAB;  Service: Cardiovascular;  Laterality: N/A;  ? PERIPHERAL VASCULAR CATHETERIZATION Right 05/02/2016  ? Procedure: Peripheral Vascular Balloon Angioplasty;  Surgeon: Wellington Hampshire, MD;  Location: [place] CV LAB;  Service: Cardiovascular;  Laterality: Right;  SFA  ? TEE WITHOUT CARDIOVERSION N/A 03/13/2016  ? Procedure: TRANSESOPHAGEAL ECHOCARDIOGRAM (TEE);  Surgeon: Ivin Poot, MD;  Location: [place];  Service: Open Heart Surgery;  Laterality: N/A;  ? VSD REPAIR N/A 03/13/2016  ? Procedure: VENTRICULAR SEPTAL DEFECT (VSD) REPAIR;  Surgeon: Ivin Poot, MD;  Location: [place];  Service: Open Heart Surgery;  Laterality: N/A;  ? ?Social History  ? ?Occupational History  ? Not on file  ?Tobacco Use  ? Smoking status: Former  ?  Packs/day: 0.50  ?  Types: Cigarettes  ?  Quit date: 11/20/2015  ?  Years since quitting: 5.8  ? Smokeless tobacco: Never  ? Tobacco comments:  ?  quit 2018  ?Vaping Use  ? Vaping Use: Never used  ?Substance and Sexual Activity  ?  Alcohol use: No  ? Drug use: No  ? Sexual activity: Not on file  ? ? ? ? ? ?

## 2021-09-28 ENCOUNTER — Other Ambulatory Visit: Payer: Self-pay | Admitting: Internal Medicine

## 2021-09-28 DIAGNOSIS — Z794 Long term (current) use of insulin: Secondary | ICD-10-CM

## 2021-10-06 ENCOUNTER — Other Ambulatory Visit: Payer: Self-pay | Admitting: Internal Medicine

## 2021-10-06 DIAGNOSIS — Z794 Long term (current) use of insulin: Secondary | ICD-10-CM

## 2021-10-09 ENCOUNTER — Encounter: Payer: Self-pay | Admitting: Internal Medicine

## 2021-10-09 ENCOUNTER — Ambulatory Visit (INDEPENDENT_AMBULATORY_CARE_PROVIDER_SITE_OTHER): Payer: Medicare Other | Admitting: Internal Medicine

## 2021-10-09 ENCOUNTER — Ambulatory Visit: Payer: Commercial Managed Care - HMO

## 2021-10-09 VITALS — BP 122/68 | HR 63 | Temp 97.7°F | Ht 65.0 in | Wt 179.0 lb

## 2021-10-09 DIAGNOSIS — Z794 Long term (current) use of insulin: Secondary | ICD-10-CM | POA: Diagnosis not present

## 2021-10-09 DIAGNOSIS — E119 Type 2 diabetes mellitus without complications: Secondary | ICD-10-CM

## 2021-10-09 DIAGNOSIS — D4959 Neoplasm of unspecified behavior of other genitourinary organ: Secondary | ICD-10-CM | POA: Insufficient documentation

## 2021-10-09 DIAGNOSIS — N1832 Chronic kidney disease, stage 3b: Secondary | ICD-10-CM

## 2021-10-09 LAB — CBC WITH DIFFERENTIAL/PLATELET
Basophils Absolute: 0 10*3/uL (ref 0.0–0.1)
Basophils Relative: 0.2 % (ref 0.0–3.0)
Eosinophils Absolute: 0.1 10*3/uL (ref 0.0–0.7)
Eosinophils Relative: 1.2 % (ref 0.0–5.0)
HCT: 40.1 % (ref 39.0–52.0)
Hemoglobin: 13.9 g/dL (ref 13.0–17.0)
Lymphocytes Relative: 27.9 % (ref 12.0–46.0)
Lymphs Abs: 1.7 10*3/uL (ref 0.7–4.0)
MCHC: 34.8 g/dL (ref 30.0–36.0)
MCV: 86.5 fl (ref 78.0–100.0)
Monocytes Absolute: 0.5 10*3/uL (ref 0.1–1.0)
Monocytes Relative: 7.9 % (ref 3.0–12.0)
Neutro Abs: 3.9 10*3/uL (ref 1.4–7.7)
Neutrophils Relative %: 62.8 % (ref 43.0–77.0)
Platelets: 151 10*3/uL (ref 150.0–400.0)
RBC: 4.64 Mil/uL (ref 4.22–5.81)
RDW: 13.8 % (ref 11.5–15.5)
WBC: 6.2 10*3/uL (ref 4.0–10.5)

## 2021-10-09 LAB — BASIC METABOLIC PANEL
BUN: 36 mg/dL — ABNORMAL HIGH (ref 6–23)
CO2: 26 mEq/L (ref 19–32)
Calcium: 9.4 mg/dL (ref 8.4–10.5)
Chloride: 102 mEq/L (ref 96–112)
Creatinine, Ser: 1.64 mg/dL — ABNORMAL HIGH (ref 0.40–1.50)
GFR: 42.15 mL/min — ABNORMAL LOW (ref >60.00)
Glucose, Bld: 263 mg/dL — ABNORMAL HIGH (ref 70–99)
Potassium: 3.8 mEq/L (ref 3.5–5.1)
Sodium: 137 mEq/L (ref 135–145)

## 2021-10-09 LAB — HEMOGLOBIN A1C: Hgb A1c MFr Bld: 8.5 % — ABNORMAL HIGH (ref 4.6–6.5)

## 2021-10-09 NOTE — Progress Notes (Signed)
? ? ?Subjective:  ?Patient ID: William [name], male    DOB: 02/11/1951  Age: 71 y.o. MRN: [id number] ? ?CC: Anemia and Diabetes ? ? ?HPI ?William [name] presents for f/up - ? ?He complains of a one month hx of bleeding and painful lesion on his foreskin. ? ?Outpatient Medications Prior to Visit  ?Medication Sig Dispense Refill  ? ACCU-CHEK AVIVA PLUS test strip TEST 3 TIMES DAILY PRIOR TO MEALS AND AT BEDTIME 100 strip 11  ? albuterol (PROVENTIL HFA;VENTOLIN HFA) 108 (90 Base) MCG/ACT inhaler Inhale 2 puffs into the lungs every 6 (six) hours as needed for wheezing or shortness of breath. 1 Inhaler 0  ? aspirin EC 81 MG tablet Take 1 tablet (81 mg total) by mouth daily. 30 tablet 3  ? carvedilol (COREG) 3.125 MG tablet TAKE 1 TABLET(3.125 MG) BY MOUTH TWICE DAILY WITH A MEAL 180 tablet 0  ? Continuous Blood Gluc Receiver (FREESTYLE LIBRE 2 READER) DEVI 1 Act by Does not apply route daily. 2 each 5  ? Continuous Blood Gluc Sensor (FREESTYLE LIBRE 2 SENSOR) MISC 1 Act by Does not apply route daily. 2 each 5  ? FARXIGA 10 MG TABS tablet TAKE 1 TABLET(10 MG) BY MOUTH DAILY BEFORE BREAKFAST 90 tablet 0  ? gabapentin (NEURONTIN) 300 MG capsule Take 1 capsule (300 mg total) by mouth 2 (two) times daily. 180 capsule 1  ? insulin glargine (LANTUS SOLOSTAR) 100 UNIT/ML Solostar Pen INJECT 55 UNITS UNDER THE SKIN DAILY 51 mL 0  ? insulin lispro (HUMALOG KWIKPEN) 100 UNIT/ML KwikPen Inject 10 Units into the skin 3 (three) times daily. ADMINISTER UP TO 10 UNITS UNDER THE SKIN THREE TIMES DAILY AS DIRECTED PER SLIDING SCALE 27 mL 1  ? Insulin Pen Needle 31G X 8 MM MISC 1 each by Does not apply route 4 (four) times daily. 100 each 12  ? pantoprazole (PROTONIX) 40 MG tablet TAKE 1 TABLET(40 MG) BY MOUTH TWICE DAILY BEFORE A MEAL 180 tablet 0  ? pramipexole (MIRAPEX) 0.125 MG tablet TAKE 1 TABLET(0.125 MG) BY MOUTH AT BEDTIME 90 tablet 0  ? rosuvastatin (CRESTOR) 40 MG tablet TAKE 1 TABLET BY MOUTH EVERY DAY 90 tablet 3  ? torsemide  (DEMADEX) 20 MG tablet TAKE 1 TABLET(20 MG) BY MOUTH 3 TIMES A WEEK 38 tablet 0  ? warfarin (COUMADIN) 5 MG tablet Take 1-2 tablets Daily or as prescribed by [facility]. 180 tablet 1  ? Accu-Chek Softclix Lancets lancets 1 each by Other route 4 (four) times daily as needed for other. Use as instructed (Patient not taking: Reported on 08/01/2021) 200 each 5  ? acetaminophen (TYLENOL) 650 MG CR tablet Take 650 mg by mouth every 8 (eight) hours as needed for pain. (Patient not taking: Reported on 08/01/2021)    ? Blood Glucose Monitoring Suppl (ACCU-CHEK AVIVA PLUS) w/Device KIT 1 each by Does not apply route 4 (four) times daily -  before meals and at bedtime. (Patient not taking: Reported on 08/01/2021) 1 kit 0  ? Glucagon (GVOKE HYPOPEN 2-PACK) 1 MG/0.2ML SOAJ Inject 1 Act into the skin daily as needed. (Patient not taking: Reported on 08/01/2021) 2 mL 5  ? ?No facility-administered medications prior to visit.  ? ? ?ROS ?Review of Systems  ?Constitutional: Negative.  Negative for chills, diaphoresis and fever.  ?HENT: Negative.    ?Eyes: Negative.   ?Respiratory:  Negative for cough, shortness of breath and wheezing.   ?Cardiovascular:  Negative for chest pain, palpitations and leg swelling.  ?  Gastrointestinal:  Negative for abdominal pain, diarrhea and nausea.  ?Endocrine: Negative.   ?Genitourinary:  Positive for genital sores and penile pain. Negative for difficulty urinating, dysuria, hematuria, penile discharge and urgency.  ?Musculoskeletal:  Positive for arthralgias and gait problem. Negative for myalgias.  ?Skin: Negative.  Negative for rash.  ?Neurological:  Negative for dizziness and weakness.  ?Hematological:  Negative for adenopathy. Does not bruise/bleed easily.  ?Psychiatric/Behavioral: Negative.    ? ?Objective:  ?BP 122/68 (BP Location: Right Arm, Patient Position: Sitting, Cuff Size: Normal)   Pulse 63   Temp 97.7 ?F (36.5 ?C) (Oral)   Ht '5\' 5"'  (1.651 m)   Wt 179 lb (81.2 kg)   SpO2 96%   BMI  29.79 kg/m?  ? ?BP Readings from Last 3 Encounters:  ?10/09/21 122/68  ?08/01/21 136/62  ?07/10/21 (!) 102/54  ? ? ?Wt Readings from Last 3 Encounters:  ?10/09/21 179 lb (81.2 kg)  ?08/01/21 183 lb (83 kg)  ?07/10/21 179 lb (81.2 kg)  ? ? ?Physical Exam ?Vitals reviewed.  ?HENT:  ?   Mouth/Throat:  ?   Mouth: Mucous membranes are moist.  ?Eyes:  ?   General: No scleral icterus. ?   Conjunctiva/sclera: Conjunctivae normal.  ?Cardiovascular:  ?   Rate and Rhythm: Normal rate and regular rhythm.  ?   Heart sounds: No murmur heard. ?Pulmonary:  ?   Effort: Pulmonary effort is normal.  ?   Breath sounds: No stridor. No wheezing, rhonchi or rales.  ?Abdominal:  ?   General: Abdomen is flat.  ?   Palpations: There is no mass.  ?   Tenderness: There is no abdominal tenderness. There is no guarding.  ?   Hernia: No hernia is present. There is no hernia in the left inguinal area or right inguinal area.  ?Genitourinary: ?   Pubic Area: No rash.   ?   Penis: Uncircumcised. Lesions present. No phimosis, paraphimosis, hypospadias, erythema, tenderness, discharge or swelling.   ?   Testes: Normal.  ?   Epididymis:  ?   Right: Normal.  ?   Left: Normal.  ?   Comments: Photo taken with his permission.  There is a 1 cm, ulcerated lesion covering his dorsal foreskin. ?Musculoskeletal:  ?   Cervical back: Neck supple.  ?   Right lower leg: No edema.  ?   Left lower leg: No edema.  ?Lymphadenopathy:  ?   Cervical: No cervical adenopathy.  ?   Lower Body: No right inguinal adenopathy. No left inguinal adenopathy.  ?Skin: ?   General: Skin is warm and dry.  ?   Findings: No rash.  ?Neurological:  ?   Mental Status: He is alert. Mental status is at baseline.  ? ? ?Lab Results  ?Component Value Date  ? WBC 6.2 10/09/2021  ? HGB 13.9 10/09/2021  ? HCT 40.1 10/09/2021  ? PLT 151.0 10/09/2021  ? GLUCOSE 263 (H) 10/09/2021  ? CHOL 88 11/22/2020  ? TRIG 173.0 (H) 11/22/2020  ? HDL 27.60 (L) 11/22/2020  ? [place] 26 11/22/2020  ? ALT 15  11/22/2020  ? AST 17 11/22/2020  ? NA 137 10/09/2021  ? K 3.8 10/09/2021  ? CL 102 10/09/2021  ? CREATININE 1.64 (H) 10/09/2021  ? BUN 36 (H) 10/09/2021  ? CO2 26 10/09/2021  ? TSH 2.53 07/10/2021  ? PSA 3.68 11/22/2020  ? INR 2.0 09/25/2021  ? HGBA1C 8.5 (H) 10/09/2021  ? MICROALBUR 22.6 (H) 11/22/2020  ? ? ?  VAS [place] ABI WITH/WO TBI ? ?Result Date: 01/25/2021 ? LOWER EXTREMITY DOPPLER STUDY Patient Name:  BEXTON HAAK  Date of Exam:   01/25/2021 Medical Rec #: [id number]    Accession #:    [id number] Date of Birth: 05/27/1951   Patient Gender: M Patient Age:   33Y Exam Location:  [facility] Procedure:      VAS [place] ABI WITH/WO TBI Referring Phys: [place] --------------------------------------------------------------------------------  Indications: Claudication, and peripheral artery disease. High Risk         Hypertension, hyperlipidemia, Diabetes, past history of Factors:          smoking. Other Factors: Patient states that once in awhile he will get a cramp at night                on his right anterior calf area. He still complains of                claudication symptoms in both legs. He states he can only walk                for about 20 minutes and starts feeling calf pain, bilaterally.                He states he has to sit down and rest.  Vascular Interventions: 05/2016-right distal SFA angioplasty. S/P                         transmetatarsal amputation of foot, right. Comparison Study: 08/2020-Right ABI as 0.92 and left 0.83 Performing Technologist: Wilkie Aye RVT  Examination Guidelines: A complete evaluation includes at minimum, Doppler waveform signals and systolic blood pressure reading at the level of bilateral brachial, anterior tibial, and posterior tibial arteries, when vessel segments are accessible. Bilateral testing is considered an integral part of a complete examination. Photoelectric Plethysmograph (PPG) waveforms and toe systolic pressure readings are included as required and additional  duplex testing as needed. Limited examinations for reoccurring indications may be performed as noted.  ABI Findings: +---------+------------------+-----+----------+--------------------------+ Right    Rt Pressure (mmHg)

## 2021-10-09 NOTE — Progress Notes (Signed)
?[facility] Note ? ?10/11/2021 ? ?  ? ?CHIEF COMPLAINT ?Patient presents for Retina Follow Up ? ?HISTORY OF PRESENT ILLNESS: ?William [name] is a 71 y.o. male who presents to the clinic today for:  ? ?HPI   ? ? Retina Follow Up   ?Patient presents with  Diabetic Retinopathy.  In both eyes.  This started 4 weeks ago.  I, the attending physician,  performed the HPI with the patient and updated documentation appropriately. ? ?  ?  ? ? Comments   ?Patient here for 4 weeks retina follow up for NPDR OU. Patient states vision doing ok. No eye pain.  ? ?  ?  ?Last edited by [name], MD on 10/14/2021  2:01 AM.  ?  ? ?Referring physician: ?[name], MD ?[place],  [place] [address] ? ?HISTORICAL INFORMATION:  ?Selected notes from the [facility] ?Referred by Dr. [name] for eval of DME OU ?LEE:  ?Ocular Hx- ?PMH- ?  ? ?CURRENT MEDICATIONS: ?No current outpatient medications on file. (Ophthalmic Drugs)  ? ?No current facility-administered medications for this visit. (Ophthalmic Drugs)  ? ?Current Outpatient Medications (Other)  ?Medication Sig  ? albuterol (PROVENTIL HFA;VENTOLIN HFA) 108 (90 Base) MCG/ACT inhaler Inhale 2 puffs into the lungs every 6 (six) hours as needed for wheezing or shortness of breath.  ? aspirin EC 81 MG tablet Take 1 tablet (81 mg total) by mouth daily.  ? carvedilol (COREG) 3.125 MG tablet TAKE 1 TABLET(3.125 MG) BY MOUTH TWICE DAILY WITH A MEAL  ? Continuous Blood Gluc Receiver (FREESTYLE LIBRE 2 READER) DEVI 1 Act by Does not apply route daily.  ? Continuous Blood Gluc Sensor (FREESTYLE LIBRE 2 SENSOR) MISC 1 Act by Does not apply route daily.  ? FARXIGA 10 MG TABS tablet TAKE 1 TABLET(10 MG) BY MOUTH DAILY BEFORE BREAKFAST  ? gabapentin (NEURONTIN) 300 MG capsule Take 1 capsule (300 mg total) by mouth 2 (two) times daily.  ? insulin glargine (LANTUS SOLOSTAR) 100 UNIT/ML Solostar Pen INJECT 55 UNITS UNDER THE SKIN DAILY  ? insulin lispro  (HUMALOG KWIKPEN) 100 UNIT/ML KwikPen Inject 10 Units into the skin 3 (three) times daily. ADMINISTER UP TO 10 UNITS UNDER THE SKIN THREE TIMES DAILY AS DIRECTED PER SLIDING SCALE  ? Insulin Pen Needle 31G X 8 MM MISC 1 each by Does not apply route 4 (four) times daily.  ? pantoprazole (PROTONIX) 40 MG tablet TAKE 1 TABLET(40 MG) BY MOUTH TWICE DAILY BEFORE A MEAL  ? pramipexole (MIRAPEX) 0.125 MG tablet TAKE 1 TABLET(0.125 MG) BY MOUTH AT BEDTIME  ? rosuvastatin (CRESTOR) 40 MG tablet TAKE 1 TABLET BY MOUTH EVERY DAY  ? torsemide (DEMADEX) 20 MG tablet TAKE 1 TABLET(20 MG) BY MOUTH 3 TIMES A WEEK  ? warfarin (COUMADIN) 5 MG tablet Take 1-2 tablets Daily or as prescribed by [facility]mad[facility] Clinic.  ? Glucagon (GVOKE HYPOPEN 2-PACK) 1 MG/0.2ML SOAJ Inject 1 Act into the skin daily as needed. (Patient not taking: Reported on 08/01/2021)  ? ?No current facility-administered medications for this visit. (Other)  ? ?REVIEW OF SYSTEMS: ?ROS   ?Positive for: Gastrointestinal, Genitourinary, Endocrine, Eyes ?Negative for: Constitutional, Neurological, Skin, Musculoskeletal, HENT, Cardiovascular, Respiratory, Psychiatric, Allergic/Imm, Heme/Lymph ?Last edited by [name], COA on 10/11/2021  8:56 AM.  ?  ? ?ALLERGIES ?Allergies  ?Allergen Reactions  ? Morphine And Related Shortness Of Breath and Other (See Comments)  ?  UNSPECIFIED REACTION ?"Pt said it  was too much" ?  ? Latex Rash  ? ?PAST MEDICAL HISTORY ?Past Medical History:  ?Diagnosis Date  ? AKI (acute kidney injury) ([facility])   ? With STEMI in 2017  ? Chronic systolic CHF (congestive heart failure) ([facility])   ? Diabetes mellitus without complication ([facility])   ? Diabetic retinopathy ([facility])   ? Hx of adenomatous colonic polyps 04/07/2018  ? Hyperlipidemia   ? Hypertension   ? Hypertensive retinopathy   ? Paroxysmal atrial fibrillation ([name])   ? STEMI (ST elevation myocardial infarction) ([place]) 2017  ? ?Past Surgical History:  ?Procedure Laterality Date  ? ABDOMINAL AORTOGRAM  W/LOWER EXTREMITY N/A 09/19/2016  ? Procedure: Abdominal Aortogram w/Lower Extremity;  Surgeon: Wellington Hampshire, MD;  Location: [facility];  Service: Cardiovascular;  Laterality: N/A;  ? AMPUTATION Right 03/23/2016  ? Procedure: 1st and 2nd Ray Amputation Right Foot;  Surgeon: Newt Minion, MD;  Location: [facility];  Service: Orthopedics;  Laterality: Right;  ? AMPUTATION Right 06/21/2016  ? Procedure: RIGHT TRANSMETATARSAL AMPUTATION;  Surgeon: Newt Minion, MD;  Location: [facility];  Service: Orthopedics;  Laterality: Right;  ? CARDIAC CATHETERIZATION N/A 03/13/2016  ? Procedure: Right/Left Heart Cath and Coronary Angiography;  Surgeon: Sherren Mocha, MD;  Location: [facility];  Service: Cardiovascular;  Laterality: N/A;  ? CARDIAC CATHETERIZATION N/A 03/13/2016  ? Procedure: IABP Insertion;  Surgeon: Sherren Mocha, MD;  Location: [facility];  Service: Cardiovascular;  Laterality: N/A;  ? CATARACT EXTRACTION    ? CATARACT EXTRACTION, BILATERAL    ? [place]  ? has had 3 neck surgeries from breaking his neck  ? CORONARY ARTERY BYPASS GRAFT N/A 03/13/2016  ? Procedure: CORONARY ARTERY BYPASS GRAFTING (CABG) x 1 (SVG to OM) with EVH from [place];  Surgeon: Ivin Poot, MD;  Location: [facility];  Service: Open Heart Surgery;  Laterality: N/A;  ? EYE SURGERY    ? LOWER EXTREMITY ANGIOGRAM  05/02/2016  ? Procedure: Lower Extremity Angiogram;  Surgeon: Wellington Hampshire, MD;  Location: [facility];  Service: Cardiovascular;;  Limited left femoral runoff ?right femoral runoff  ? PERIPHERAL VASCULAR CATHETERIZATION N/A 05/02/2016  ? Procedure: Abdominal Aortogram;  Surgeon: Wellington Hampshire, MD;  Location: [facility];  Service: Cardiovascular;  Laterality: N/A;  ? PERIPHERAL VASCULAR CATHETERIZATION Right 05/02/2016  ? Procedure: Peripheral Vascular Balloon Angioplasty;  Surgeon: Wellington Hampshire, MD;  Location: [facility];  Service:  Cardiovascular;  Laterality: Right;  SFA  ? TEE WITHOUT CARDIOVERSION N/A 03/13/2016  ? Procedure: TRANSESOPHAGEAL ECHOCARDIOGRAM (TEE);  Surgeon: Ivin Poot, MD;  Location: [facility];  Service: Open Heart Surgery;  Laterality: N/A;  ? VSD REPAIR N/A 03/13/2016  ? Procedure: VENTRICULAR SEPTAL DEFECT (VSD) REPAIR;  Surgeon: Ivin Poot, MD;  Location: [facility];  Service: Open Heart Surgery;  Laterality: N/A;  ? ?FAMILY HISTORY ?Family History  ?Problem Relation Age of Onset  ? Diabetes Maternal Grandmother   ? Diabetes Mother   ? Aneurysm Mother   ? Peripheral Artery Disease Mother   ? Coronary artery disease Mother   ? Peptic Ulcer Father   ? Retinoblastoma Daughter   ? Colon cancer Neg Hx   ? Rectal cancer Neg Hx   ? ?SOCIAL HISTORY ?Social History  ? ?Tobacco Use  ? Smoking status: Former  ?  Packs/day: 0.50  ?  Types: Cigarettes  ?  Quit date: 11/20/2015  ?  Years since quitting: 5.9  ? Smokeless tobacco: Never  ? Tobacco comments:  ?  quit 2018  ?Vaping Use  ? Vaping Use: Never used  ?Substance Use Topics  ? Alcohol use: No  ? Drug use: No  ?  ? ?  ?OPHTHALMIC EXAM: ?Base Eye Exam   ? ? Visual Acuity (Snellen - Linear)   ? ?   Right Left  ? Dist [place] 20/30 20/30 -1  ? Dist ph [place] 20/25 -2 20/25 +2  ? ?  ?  ? ? Tonometry (Tonopen, 8:54 AM)   ? ?   Right Left  ? Pressure 15 16  ? ?  ?  ? ? Pupils   ? ?   Dark Light Shape React APD  ? Right 2 1 Round Minimal None  ? Left 2 1 Round Minimal None  ? ?  ?  ? ? Visual Fields (Counting fingers)   ? ?   Left Right  ?  Full Full  ? ?  ?  ? ? Extraocular Movement   ? ?   Right Left  ?  Full, Ortho Full, Ortho  ? ?  ?  ? ? Neuro/Psych   ? ? Oriented x3: Yes  ? Mood/Affect: Normal  ? ?  ?  ? ? Dilation   ? ? Both eyes: 1.0% Mydriacyl, 2.5% Phenylephrine @ 8:54 AM  ? ?  ?  ? ?  ? ?Slit Lamp and Fundus Exam   ? ? Slit Lamp Exam   ? ?   Right Left  ? Lids/Lashes Dermatochalasis - upper lid, mild MGD Dermatochalasis - upper lid, mild MGD  ? Conjunctiva/Sclera White and quiet White  and quiet  ? Cornea trace PEE trace PEE  ? Anterior Chamber Deep and quiet Deep and quiet  ? Iris Round and dilated, No NVI Round and dilated, No NVI  ? Lens PC IOL in good position PC IOL in good position  ? Ant

## 2021-10-09 NOTE — Patient Instructions (Signed)
Type 2 Diabetes Mellitus, Diagnosis, Adult ?Type 2 diabetes (type 2 diabetes mellitus) is a long-term, or chronic, disease. In type 2 diabetes, one or both of these problems may be present: ?The pancreas does not make enough of a hormone called insulin. ?Cells in the body do not respond properly to the insulin that the body makes (insulin resistance). ?Normally, insulin allows blood sugar (glucose) to enter cells in the body. The cells use glucose for energy. Insulin resistance or lack of insulin causes excess glucose to build up in the blood instead of going into cells. This causes high blood glucose (hyperglycemia).  ?What are the causes? ?The exact cause of type 2 diabetes is not known. ?What increases the risk? ?The following factors may make you more likely to develop this condition: ?Having a family member with type 2 diabetes. ?Being overweight or obese. ?Being inactive (sedentary). ?Having been diagnosed with insulin resistance. ?Having a history of prediabetes, diabetes when you were pregnant (gestational diabetes), or polycystic ovary syndrome (PCOS). ?What are the signs or symptoms? ?In the early stage of this condition, you may not have symptoms. Symptoms develop slowly and may include: ?Increased thirst or hunger. ?Increased urination. ?Unexplained weight loss. ?Tiredness (fatigue) or weakness. ?Vision changes, such as blurry vision. ?Dark patches on the skin. ?How is this diagnosed? ?This condition is diagnosed based on your symptoms, your medical history, a physical exam, and your blood glucose level. Your blood glucose may be checked with one or more of the following blood tests: ?A fasting blood glucose (FBG) test. You will not be allowed to eat (you will fast) for 8 hours or longer before a blood sample is taken. ?A random blood glucose test. This test checks blood glucose at any time of day regardless of when you ate. ?An A1C (hemoglobin A1C) blood test. This test provides information about blood  glucose levels over the previous 2-3 months. ?An oral glucose tolerance test (OGTT). This test measures your blood glucose at two times: ?After fasting. This is your baseline blood glucose level. ?Two hours after drinking a beverage that contains glucose. ?You may be diagnosed with type 2 diabetes if: ?Your fasting blood glucose level is 126 mg/dL (7.0 mmol/L) or higher. ?Your random blood glucose level is 200 mg/dL (11.1 mmol/L) or higher. ?Your A1C level is 6.5% or higher. ?Your oral glucose tolerance test result is higher than 200 mg/dL (11.1 mmol/L). ?These blood tests may be repeated to confirm your diagnosis. ?How is this treated? ?Your treatment may be managed by a specialist called an endocrinologist. Type 2 diabetes may be treated by following instructions from your health care provider about: ?Making dietary and lifestyle changes. These may include: ?Following a personalized nutrition plan that is developed by a registered dietitian. ?Exercising regularly. ?Finding ways to manage stress. ?Checking your blood glucose level as often as told. ?Taking diabetes medicines or insulin daily. This helps to keep your blood glucose levels in the healthy range. ?Taking medicines to help prevent complications from diabetes. Medicines may include: ?Aspirin. ?Medicine to lower cholesterol. ?Medicine to control blood pressure. ?Your health care provider will set treatment goals for you. Your goals will be based on your age, other medical conditions you have, and how you respond to diabetes treatment. Generally, the goal of treatment is to maintain the following blood glucose levels: ?Before meals: 80-130 mg/dL (4.4-7.2 mmol/L). ?After meals: below 180 mg/dL (10 mmol/L). ?A1C level: less than 7%. ?Follow these instructions at home: ?Questions to ask your health care provider ?  Consider asking the following questions: ?Should I meet with a certified diabetes care and education specialist? ?What diabetes medicines do I need,  and when should I take them? ?What equipment will I need to manage my diabetes at home? ?How often do I need to check my blood glucose? ?Where can I find a support group for people with diabetes? ?What number can I call if I have questions? ?When is my next appointment? ?General instructions ?Take over-the-counter and prescription medicines only as told by your health care provider. ?Keep all follow-up visits. This is important. ?Where to find more information ?For help and guidance and for more information about diabetes, please visit: ?American Diabetes Association (ADA): www.diabetes.org ?American Association of Diabetes Care and Education Specialists (ADCES): www.diabeteseducator.org ?International Diabetes Federation (IDF): www.idf.org ?Contact a health care provider if: ?Your blood glucose is at or above 240 mg/dL (13.3 mmol/L) for 2 days in a row. ?You have been sick or have had a fever for 2 days or longer, and you are not getting better. ?You have any of the following problems for more than 6 hours: ?You cannot eat or drink. ?You have nausea and vomiting. ?You have diarrhea. ?Get help right away if: ?You have severe hypoglycemia. This means your blood glucose is lower than 54 mg/dL (3.0 mmol/L). ?You become confused or you have trouble thinking clearly. ?You have difficulty breathing. ?You have moderate or large ketone levels in your urine. ?These symptoms may represent a serious problem that is an emergency. Do not wait to see if the symptoms will go away. Get medical help right away. Call your local emergency services (911 in the [place] Do not drive yourself to the hospital. ?Summary ?Type 2 diabetes mellitus is a long-term, or chronic, disease. In type 2 diabetes, the pancreas does not make enough of a hormone called insulin, or cells in the body do not respond properly to insulin that the body makes. ?This condition is treated by making dietary and lifestyle changes and taking diabetes medicines or  insulin. ?Your health care provider will set treatment goals for you. Your goals will be based on your age, other medical conditions you have, and how you respond to diabetes treatment. ?Keep all follow-up visits. This is important. ?This information is not intended to replace advice given to you by your health care provider. Make sure you discuss any questions you have with your health care provider. ?Document Revised: 09/12/2020 Document Reviewed: 09/12/2020 ?Elsevier Patient Education ? 2022 Elsevier Inc. ? ?

## 2021-10-11 ENCOUNTER — Ambulatory Visit (INDEPENDENT_AMBULATORY_CARE_PROVIDER_SITE_OTHER): Payer: Medicare Other | Admitting: Ophthalmology

## 2021-10-11 ENCOUNTER — Encounter (INDEPENDENT_AMBULATORY_CARE_PROVIDER_SITE_OTHER): Payer: Self-pay | Admitting: Ophthalmology

## 2021-10-11 DIAGNOSIS — H35033 Hypertensive retinopathy, bilateral: Secondary | ICD-10-CM

## 2021-10-11 DIAGNOSIS — E113313 Type 2 diabetes mellitus with moderate nonproliferative diabetic retinopathy with macular edema, bilateral: Secondary | ICD-10-CM

## 2021-10-11 DIAGNOSIS — I10 Essential (primary) hypertension: Secondary | ICD-10-CM | POA: Diagnosis not present

## 2021-10-11 DIAGNOSIS — Z961 Presence of intraocular lens: Secondary | ICD-10-CM

## 2021-10-14 ENCOUNTER — Other Ambulatory Visit: Payer: Self-pay | Admitting: Internal Medicine

## 2021-10-14 ENCOUNTER — Encounter (INDEPENDENT_AMBULATORY_CARE_PROVIDER_SITE_OTHER): Payer: Self-pay | Admitting: Ophthalmology

## 2021-10-14 DIAGNOSIS — E1152 Type 2 diabetes mellitus with diabetic peripheral angiopathy with gangrene: Secondary | ICD-10-CM

## 2021-10-14 DIAGNOSIS — E119 Type 2 diabetes mellitus without complications: Secondary | ICD-10-CM

## 2021-10-14 MED ORDER — AFLIBERCEPT 2MG/0.05ML IZ SOLN FOR KALEIDOSCOPE
2.0000 mg | INTRAVITREAL | Status: AC | PRN
  Administered 2021-10-14: 2 mg via INTRAVITREAL

## 2021-10-14 MED ORDER — BEVACIZUMAB CHEMO INJECTION 1.25MG/0.05ML SYRINGE FOR KALEIDOSCOPE
1.2500 mg | INTRAVITREAL | Status: AC | PRN
  Administered 2021-10-14: 1.25 mg via INTRAVITREAL

## 2021-10-16 DIAGNOSIS — N481 Balanitis: Secondary | ICD-10-CM | POA: Diagnosis not present

## 2021-10-19 ENCOUNTER — Other Ambulatory Visit: Payer: Self-pay | Admitting: Internal Medicine

## 2021-10-19 DIAGNOSIS — I5022 Chronic systolic (congestive) heart failure: Secondary | ICD-10-CM

## 2021-10-23 ENCOUNTER — Telehealth: Payer: Self-pay | Admitting: Cardiovascular Disease

## 2021-10-23 ENCOUNTER — Other Ambulatory Visit: Payer: Self-pay | Admitting: Urology

## 2021-10-23 NOTE — Telephone Encounter (Signed)
? ?  Pre-operative Risk Assessment  ?  ?Patient Name: William [name]  ?DOB: [birth year] ?MRN: [id number]  ? ?  ? ?Request for Surgical Clearance   ? ?Procedure:   circumcision   ? ?Date of Surgery:  Clearance 03/06/22                              ?   ?Surgeon:  Dr. Cain Sieve ?Surgeon's Group or Practice Name:  [facility] ?Phone number:  [phone] ?Fax number:  [phone] ?  ?Type of Clearance Requested:   ?- Medical  ?  ?Type of Anesthesia:  General  ?  ?Additional requests/questions:     ? ?Signed, ?Milbert Coulter   ?10/23/2021, 3:32 PM  ? ?

## 2021-10-24 ENCOUNTER — Telehealth: Payer: Self-pay | Admitting: *Deleted

## 2021-10-24 NOTE — Telephone Encounter (Signed)
DPR ok to s/w pt's daughter [name]. [name] has set up a tele pre op appt for 10/25/21 @ 4 pm. Med rec and consent are done.  ? ?  ?Patient Consent for Virtual Visit  ? ? ?   ? ?Lauri Till has provided verbal consent on 10/24/2021 for a virtual visit (video or telephone). ? ? ?CONSENT FOR VIRTUAL VISIT FOR:  William [name]  ?By participating in this virtual visit I agree to the following: ? ?I hereby voluntarily request, consent and authorize [facility] and its employed or contracted physicians, physician assistants, nurse practitioners or other licensed health care professionals (the Practitioner), to provide me with telemedicine health care services (the ?Services") as deemed necessary by the treating Practitioner. I acknowledge and consent to receive the Services by the Practitioner via telemedicine. I understand that the telemedicine visit will involve communicating with the Practitioner through live audiovisual communication technology and the disclosure of certain medical information by electronic transmission. I acknowledge that I have been given the opportunity to request an in-person assessment or other available alternative prior to the telemedicine visit and am voluntarily participating in the telemedicine visit. ? ?I understand that I have the right to withhold or withdraw my consent to the use of telemedicine in the course of my care at any time, without affecting my right to future care or treatment, and that the Practitioner or I may terminate the telemedicine visit at any time. I understand that I have the right to inspect all information obtained and/or recorded in the course of the telemedicine visit and may receive copies of available information for a reasonable fee.  I understand that some of the potential risks of receiving the Services via telemedicine include:  ?Delay or interruption in medical evaluation due to technological equipment failure or disruption; ?Information transmitted may  not be sufficient (e.g. poor resolution of images) to allow for appropriate medical decision making by the Practitioner; and/or  ?In rare instances, security protocols could fail, causing a breach of personal health information. ? ?Furthermore, I acknowledge that it is my responsibility to provide information about my medical history, conditions and care that is complete and accurate to the best of my ability. I acknowledge that Practitioner's advice, recommendations, and/or decision may be based on factors not within their control, such as incomplete or inaccurate data provided by me or distortions of diagnostic images or specimens that may result from electronic transmissions. I understand that the practice of medicine is not an exact science and that Practitioner makes no warranties or guarantees regarding treatment outcomes. I acknowledge that a copy of this consent can be made available to me via my patient portal ([facility]), or I can request a printed copy by calling the office of [facility].   ? ?I understand that my insurance will be billed for this visit.  ? ?I have read or had this consent read to me. ?I understand the contents of this consent, which adequately explains the benefits and risks of the Services being provided via telemedicine.  ?I have been provided ample opportunity to ask questions regarding this consent and the Services and have had my questions answered to my satisfaction. ?I give my informed consent for the services to be provided through the use of telemedicine in my medical care ? ? ? ?

## 2021-10-24 NOTE — Telephone Encounter (Signed)
Received a message from urologist directly as: ? ?"Good morning - my name is Donald Pore and I'm a urologist with [facility]. My office recently sent you a request for pre op clearance for our mutual patient with William [name]. On that request, the date of surgery was listed as 03/06/2022 - it's actually 11/07/2021. Sorry for the error. Is there anyway we can expedite his operative clearance? There's a lesion on his foreskin that is most likely cancer, so I'd like to remove it as soon as safely possible.  ? ?Thanks  ?Lurena Joiner " ? ? ?Preoperative team, please contact this patient and set up a phone call appointment for further preoperative risk assessment. Please obtain consent and complete medication review. Thank you for your help.  ?

## 2021-10-24 NOTE — Telephone Encounter (Signed)
Surgery so far away. Please send clearance request in early July. Thank you.  ? ?I will route this recommendation to the requesting party via Epic fax function and remove from pre-op pool. ? ?Please call with questions. ? ?Leanor Kail, PA ?10/24/2021, 8:20 AM  ?

## 2021-10-24 NOTE — Telephone Encounter (Signed)
? ?  Pre-operative Risk Assessment  ?  ?Patient Name: William [name]  ?DOB: 1951/03/08 ?MRN: [id number]  ? ?  ? ?Request for Surgical Clearance   ? ?Procedure:   Circumcision   ? ?Date of Surgery:  Clearance 11/07/21                              ?   ?Surgeon:  Dr. Cain Sieve ?Surgeon's Group or Practice Name:  [facility] ?Phone number:  [phone] ?Fax number:  [phone] ?  ?Type of Clearance Requested:   ?- Medical  ?  ?Type of Anesthesia:  General  ?  ?Additional requests/questions:   Wrong date for scheduled procedures was entered in previous request ? ?Signed, ?Trilby Drummer   ?10/24/2021, 10:24 AM   ?

## 2021-10-24 NOTE — Telephone Encounter (Signed)
DPR ok to s/w pt's daughter [name]. [name] has set up a tele pre op appt for 10/25/21 @ 4 pm. Med rec and consent are done.  ?

## 2021-10-30 ENCOUNTER — Telehealth: Payer: Self-pay

## 2021-10-30 ENCOUNTER — Telehealth: Payer: Self-pay | Admitting: Cardiovascular Disease

## 2021-10-30 NOTE — Telephone Encounter (Signed)
I s/w the pt's daughter [name]. I apologized for the error in regard to the appt. Pt has now been scheduled for tele pre op appt 10/31/21 @ 3:40. Daughter [name] works until 3 pm. Med rec and consent are done. ?

## 2021-10-30 NOTE — Telephone Encounter (Signed)
Following up on clearance note from 10/23/21. Patient's daughter states they never received a call for preop on 10/25/21. I don't see that an appt was made and patient is currently scheduled for his preop telehealth visit for 11/28/21. Please advise, daughter needs to know if patient needs to hold his blood thinner prior to procedure on 11/07/21. If daughter does not answer please LVM.  ?

## 2021-10-30 NOTE — Telephone Encounter (Signed)
? ?  Patient Name: William [name]  ?DOB: [birth year] ?MRN: [id number] ? ?Primary Cardiologist: Dr. Fletcher Anon ? ?Chart reviewed as part of pre-operative protocol coverage. See phone note dated 10/23/21. Looks like intention was for 4/26 phone visit but do not see it was scheduled that day, on schedule for 11/28/21. There was some initial confusion it looks like in the surgical date.  ? ?In other phone note from 4/24, [facility] had reached out to [facility]: "My office recently sent you a request for pre op clearance for our mutual patient with Mr [name]. On that request, the date of surgery was listed as 03/06/2022 - it's actually 11/07/2021. Sorry for the error. Is there anyway we can expedite his operative clearance? There's a lesion on his foreskin that is most likely cancer, so I'd like to remove it as soon as safely possible. ? ?Callback, please set up televisit ASAP, ideally Tuesday, no later than Wednesday. Will also notify pharm to prioritize clearance so that this is available at time of APP review. ? ? ?Charlie Pitter, PA-C ?10/30/2021, 3:29 PM ? ? ?

## 2021-10-30 NOTE — Progress Notes (Signed)
?[facility] Note ? ?11/08/2021 ? ?  ? ?CHIEF COMPLAINT ?Patient presents for Retina Follow Up ? ?HISTORY OF PRESENT ILLNESS: ?William [name] is a 71 y.o. male who presents to the clinic today for:  ? ?HPI   ? ? Retina Follow Up   ?Patient presents with  Diabetic Retinopathy (IVE OU (04.12.23)).  In both eyes.  This started months ago.  Duration of 4 weeks.  Since onset it is stable.  I, the attending physician,  performed the HPI with the patient and updated documentation appropriately. ? ?  ?  ? ? Comments   ?Patient feels that the vision is the same. His blood sugar was 218 this morning and his A1C is 8.1.  ? ?  ?  ?Last edited by [name], MD on 11/08/2021 11:50 AM.  ?  ?Pt states vision is stable ? ?Referring physician: ?[name], MD ?[place],   [id number] ? ?HISTORICAL INFORMATION:  ?Selected notes from the [facility] ?Referred by Dr. [name] for eval of DME OU ?LEE:  ?Ocular Hx- ?PMH- ?  ? ?CURRENT MEDICATIONS: ?No current outpatient medications on file. (Ophthalmic Drugs)  ? ?No current facility-administered medications for this visit. (Ophthalmic Drugs)  ? ?Current Outpatient Medications (Other)  ?Medication Sig  ? albuterol (PROVENTIL HFA;VENTOLIN HFA) 108 (90 Base) MCG/ACT inhaler Inhale 2 puffs into the lungs every 6 (six) hours as needed for wheezing or shortness of breath.  ? aspirin EC 81 MG tablet Take 1 tablet (81 mg total) by mouth daily.  ? carvedilol (COREG) 3.125 MG tablet TAKE 1 TABLET(3.125 MG) BY MOUTH TWICE DAILY WITH A MEAL  ? celecoxib (CELEBREX) 100 MG capsule Take 1 capsule (100 mg total) by mouth 2 (two) times daily as needed for mild pain or moderate pain.  ? Continuous Blood Gluc Receiver (FREESTYLE LIBRE 2 READER) DEVI 1 Act by Does not apply route daily.  ? Continuous Blood Gluc Sensor (FREESTYLE LIBRE 2 SENSOR) MISC 1 Act by Does not apply route daily.  ? FARXIGA 10 MG TABS tablet TAKE 1 TABLET(10 MG) BY MOUTH DAILY  BEFORE BREAKFAST  ? gabapentin (NEURONTIN) 300 MG capsule Take 1 capsule (300 mg total) by mouth 2 (two) times daily.  ? Glucagon (GVOKE HYPOPEN 2-PACK) 1 MG/0.2ML SOAJ Inject 1 Act into the skin daily as needed.  ? HUMALOG KWIKPEN 100 UNIT/ML KwikPen INJECT 10 UNITS UNDER THE SKIN THREE TIMES DAILY AS DIRECTED BY SLIDING SCALE (Patient taking differently: Inject 10-45 Units into the skin 3 (three) times daily. Pt takes with meals)  ? insulin glargine (LANTUS SOLOSTAR) 100 UNIT/ML Solostar Pen INJECT 55 UNITS UNDER THE SKIN DAILY (Patient taking differently: INJECT 55 UNITS UNDER THE SKIN DAILY ?PT TAKES IN THE PM)  ? Insulin Pen Needle 31G X 8 MM MISC 1 each by Does not apply route 4 (four) times daily.  ? oxyCODONE-acetaminophen (PERCOCET) 5-325 MG tablet Take 1 tablet by mouth every 6 (six) hours as needed for severe pain.  ? pantoprazole (PROTONIX) 40 MG tablet TAKE 1 TABLET(40 MG) BY MOUTH TWICE DAILY BEFORE A MEAL  ? pramipexole (MIRAPEX) 0.125 MG tablet TAKE 1 TABLET(0.125 MG) BY MOUTH AT BEDTIME  ? rosuvastatin (CRESTOR) 40 MG tablet TAKE 1 TABLET BY MOUTH EVERY DAY  ? torsemide (DEMADEX) 20 MG tablet TAKE 1 TABLET(20 MG) BY MOUTH 3 TIMES A WEEK (Patient taking differently: TAKE 1 TABLET(20 MG) BY MOUTH 3 TIMES A WEEK ?Monday, Wednesday and  Friday)  ? warfarin (COUMADIN) 5 MG tablet Take 1-2 tablets Daily or as prescribed by Coumadin Clinic. (Patient taking differently: Take 5-7.5 mg by mouth See admin instructions. Take 5 mg by mouth on Monday, Wednesday and Friday, take 7.5 mg on Tuesday, Thursday, Saturday and Sunday)  ? ?No current facility-administered medications for this visit. (Other)  ? ?REVIEW OF SYSTEMS: ?ROS   ?Positive for: Gastrointestinal, Genitourinary, Endocrine, Eyes ?Negative for: Constitutional, Neurological, Skin, Musculoskeletal, HENT, Cardiovascular, Respiratory, Psychiatric, Allergic/Imm, Heme/Lymph ?Last edited by Annie Paras, COT on 11/08/2021  9:15 AM.  ?   ? ?ALLERGIES ?Allergies  ?Allergen Reactions  ? Morphine And Related Shortness Of Breath and Other (See Comments)  ?  UNSPECIFIED REACTION ?"Pt said it was too much" ?  ? Latex Rash  ? ?PAST MEDICAL HISTORY ?Past Medical History:  ?Diagnosis Date  ? AKI (acute kidney injury) ([place])   ? With STEMI in 2017  ? Chronic systolic CHF (congestive heart failure) ([place])   ? Depression   ? Diabetes mellitus without complication ([place])   ? Diabetic retinopathy ([place])   ? GERD (gastroesophageal reflux disease)   ? Hx of adenomatous colonic polyps 04/07/2018  ? Hyperlipidemia   ? Hypertension   ? Hypertensive retinopathy   ? Paroxysmal atrial fibrillation ([name])   ? Peripheral vascular disease ([place])   ? Pneumonia   ? Seizures ([place])   ? hx of as a child  ? STEMI (ST elevation myocardial infarction) ([place]) 2017  ? ?Past Surgical History:  ?Procedure Laterality Date  ? ABDOMINAL AORTOGRAM W/LOWER EXTREMITY N/A 09/19/2016  ? Procedure: Abdominal Aortogram w/Lower Extremity;  Surgeon: Wellington Hampshire, MD;  Location: [facility];  Service: Cardiovascular;  Laterality: N/A;  ? AMPUTATION Right 03/23/2016  ? Procedure: 1st and 2nd Ray Amputation Right Foot;  Surgeon: Newt Minion, MD;  Location: [facility];  Service: Orthopedics;  Laterality: Right;  ? AMPUTATION Right 06/21/2016  ? Procedure: RIGHT TRANSMETATARSAL AMPUTATION;  Surgeon: Newt Minion, MD;  Location: [facility];  Service: Orthopedics;  Laterality: Right;  ? CARDIAC CATHETERIZATION N/A 03/13/2016  ? Procedure: Right/Left Heart Cath and Coronary Angiography;  Surgeon: Sherren Mocha, MD;  Location: [facility];  Service: Cardiovascular;  Laterality: N/A;  ? CARDIAC CATHETERIZATION N/A 03/13/2016  ? Procedure: IABP Insertion;  Surgeon: Sherren Mocha, MD;  Location: [facility];  Service: Cardiovascular;  Laterality: N/A;  ? CATARACT EXTRACTION    ? CATARACT EXTRACTION, BILATERAL    ? [place]  ? has had 3 neck surgeries from breaking his neck  ?  CIRCUMCISION N/A 11/07/2021  ? Procedure: CIRCUMCISION ADULT;  Surgeon: Vira Agar, MD;  [facility];  Service: Urology;  Laterality: N/A;  ? CORONARY ARTERY BYPASS GRAFT N/A 03/13/2016  ? Procedure: CORONARY ARTERY BYPASS GRAFTING (CABG) x 1 (SVG to OM) with EVH from [place];  Surgeon: Ivin Poot, MD;  Location: [facility];  Service: Open Heart Surgery;  Laterality: N/A;  ? EYE SURGERY    ? LOWER EXTREMITY ANGIOGRAM  05/02/2016  ? Procedure: Lower Extremity Angiogram;  Surgeon: Wellington Hampshire, MD;  Location: [facility];  Service: Cardiovascular;;  Limited left femoral runoff ?right femoral runoff  ? PERIPHERAL VASCULAR CATHETERIZATION N/A 05/02/2016  ? Procedure: Abdominal Aortogram;  Surgeon: Wellington Hampshire, MD;  Location: [facility];  Service: Cardiovascular;  Laterality: N/A;  ? PERIPHERAL VASCULAR CATHETERIZATION Right 05/02/2016  ? Procedure:  Peripheral Vascular Balloon Angioplasty;  Surgeon: Wellington Hampshire, MD;  Location: [facility];  Service: Cardiovascular;  Laterality: Right;  SFA  ? TEE WITHOUT CARDIOVERSION N/A 03/13/2016  ? Procedure: TRANSESOPHAGEAL ECHOCARDIOGRAM (TEE);  Surgeon: Ivin Poot, MD;  Location: [facility];  Service: Open Heart Surgery;  Laterality: N/A;  ? VSD REPAIR N/A 03/13/2016  ? Procedure: VENTRICULAR SEPTAL DEFECT (VSD) REPAIR;  Surgeon: Ivin Poot, MD;  Location: [facility];  Service: Open Heart Surgery;  Laterality: N/A;  ? ?FAMILY HISTORY ?Family History  ?Problem Relation Age of Onset  ? Diabetes Maternal Grandmother   ? Diabetes Mother   ? Aneurysm Mother   ? Peripheral Artery Disease Mother   ? Coronary artery disease Mother   ? Peptic Ulcer Father   ? Retinoblastoma Daughter   ? Colon cancer Neg Hx   ? Rectal cancer Neg Hx   ? ?SOCIAL HISTORY ?Social History  ? ?Tobacco Use  ? Smoking status: Former  ?  Packs/day: 0.50  ?  Types: Cigarettes  ?  Quit date: 11/20/2015  ?  Years since quitting: 5.9  ? Smokeless tobacco:  Never  ? Tobacco comments:  ?  quit 2018  ?Vaping Use  ? Vaping Use: Never used  ?Substance Use Topics  ? Alcohol use: No  ? Drug use: No  ?  ? ?  ?OPHTHALMIC EXAM: ?Base Eye Exam   ? ? Visual Acuity (Snellen - Linear)

## 2021-10-30 NOTE — Telephone Encounter (Signed)
I spoke to patient's daughter in regards to patient's upcoming 5/9 procedure.  He was advised to Hold Coumadin 5/4-5/8.  We will check INR on 5/8 in the office and discuss DOAC.  They verbalized understanding ?

## 2021-10-30 NOTE — Telephone Encounter (Signed)
Patient with diagnosis of afib on warfarin for anticoagulation.   ? ?Procedure: circumcision ?Date of procedure: 11/07/21 ? ?CHA2DS2-VASc Score = 5  ?This indicates a 7.2% annual risk of stroke. ?The patient's score is based upon: ?CHF History: 1 ?HTN History: 1 ?Diabetes History: 1 ?Stroke History: 0 ?Vascular Disease History: 1 ?Age Score: 1 ?Gender Score: 0 ?  ?CrCl 68m/min ?Platelet count 151K ? ?Per office protocol, patient can hold warfarin for 5 days prior to procedure. Patient will not need bridging with Lovenox (enoxaparin) around procedure. ? ?[facility] procedure scheduled on 5/9, will need 5/8 INR check moved back ~1 week. Would also recommend seeing if pt is interested in changing to a DOAC. He has Dual Complete Medicare insurance so copay would be cheap. Doesn't look like this has been discussed with pt in the past. If he changed to [place] (could be discussed on preop call), would need to hold it for 1-2 days prior to procedure. ?

## 2021-10-31 ENCOUNTER — Encounter: Payer: Self-pay | Admitting: Nurse Practitioner

## 2021-10-31 ENCOUNTER — Other Ambulatory Visit: Payer: Self-pay | Admitting: Internal Medicine

## 2021-10-31 ENCOUNTER — Ambulatory Visit (INDEPENDENT_AMBULATORY_CARE_PROVIDER_SITE_OTHER): Payer: Medicare Other | Admitting: Nurse Practitioner

## 2021-10-31 DIAGNOSIS — Z01810 Encounter for preprocedural cardiovascular examination: Secondary | ICD-10-CM

## 2021-10-31 DIAGNOSIS — Z794 Long term (current) use of insulin: Secondary | ICD-10-CM

## 2021-10-31 NOTE — Progress Notes (Signed)
? ?Virtual Visit via Telephone Note  ? ?This visit type was conducted due to national recommendations for restrictions regarding the COVID-19 Pandemic (e.g. social distancing) in an effort to limit this patient's exposure and mitigate transmission in our community.  Due to his co-morbid illnesses, this patient is at least at moderate risk for complications without adequate follow up.  This format is felt to be most appropriate for this patient at this time.  The patient did not have access to video technology/had technical difficulties with video requiring transitioning to audio format only (telephone).  All issues noted in this document were discussed and addressed.  No physical exam could be performed with this format.  Please refer to the patient's chart for his  consent to telehealth for [facility]. ? ?Evaluation Performed:  Preoperative cardiovascular risk assessment ?_____________  ? ?Date:  10/31/2021  ? ?Patient ID:  William [name], DOB 31-Jan-1951, MRN [id number] ?Patient Location:  ?Home ?Provider location:   ?Office ? ?Primary Care Provider:  [name], MD ?Primary Cardiologist:  [name], MD ? ?Chief Complaint  ?  ?71 y.o. y/o male with a h/o inferior MI, s/p CABG times 07/2015, atrial fibrillation, diabetes mellitus, PAD, HTN, HLD, HFpEF, who is pending circumcision procedure, and presents today for telephonic preoperative cardiovascular risk assessment. ? ?Past Medical History  ?  ?Past Medical History:  ?Diagnosis Date  ? AKI (acute kidney injury) ([place])   ? With STEMI in 2017  ? Chronic systolic CHF (congestive heart failure) ([place])   ? Diabetes mellitus without complication ([place])   ? Diabetic retinopathy ([place])   ? Hx of adenomatous colonic polyps 04/07/2018  ? Hyperlipidemia   ? Hypertension   ? Hypertensive retinopathy   ? Paroxysmal atrial fibrillation ([name])   ? STEMI (ST elevation myocardial infarction) ([place]) 2017  ? ?Past Surgical History:  ?Procedure Laterality Date  ? ABDOMINAL  AORTOGRAM W/LOWER EXTREMITY N/A 09/19/2016  ? Procedure: Abdominal Aortogram w/Lower Extremity;  Surgeon: Wellington Hampshire, MD;  Location: [facility];  Service: Cardiovascular;  Laterality: N/A;  ? AMPUTATION Right 03/23/2016  ? Procedure: 1st and 2nd Ray Amputation Right Foot;  Surgeon: Newt Minion, MD;  Location: [facility];  Service: Orthopedics;  Laterality: Right;  ? AMPUTATION Right 06/21/2016  ? Procedure: RIGHT TRANSMETATARSAL AMPUTATION;  Surgeon: Newt Minion, MD;  Location: [facility];  Service: Orthopedics;  Laterality: Right;  ? CARDIAC CATHETERIZATION N/A 03/13/2016  ? Procedure: Right/Left Heart Cath and Coronary Angiography;  Surgeon: Sherren Mocha, MD;  Location: [facility];  Service: Cardiovascular;  Laterality: N/A;  ? CARDIAC CATHETERIZATION N/A 03/13/2016  ? Procedure: IABP Insertion;  Surgeon: Sherren Mocha, MD;  Location: [facility];  Service: Cardiovascular;  Laterality: N/A;  ? CATARACT EXTRACTION    ? CATARACT EXTRACTION, BILATERAL    ? [place]  ? has had 3 neck surgeries from breaking his neck  ? CORONARY ARTERY BYPASS GRAFT N/A 03/13/2016  ? Procedure: CORONARY ARTERY BYPASS GRAFTING (CABG) x 1 (SVG to OM) with EVH from [place];  Surgeon: Ivin Poot, MD;  Location: [facility];  Service: Open Heart Surgery;  Laterality: N/A;  ? EYE SURGERY    ? LOWER EXTREMITY ANGIOGRAM  05/02/2016  ? Procedure: Lower Extremity Angiogram;  Surgeon: Wellington Hampshire, MD;  Location: [facility];  Service: Cardiovascular;;  Limited left femoral runoff ?right femoral runoff  ? PERIPHERAL VASCULAR CATHETERIZATION N/A 05/02/2016  ?  Procedure: Abdominal Aortogram;  Surgeon: Wellington Hampshire, MD;  Location: [facility];  Service: Cardiovascular;  Laterality: N/A;  ? PERIPHERAL VASCULAR CATHETERIZATION Right 05/02/2016  ? Procedure: Peripheral Vascular Balloon Angioplasty;  Surgeon: Wellington Hampshire, MD;  Location: [facility];   Service: Cardiovascular;  Laterality: Right;  SFA  ? TEE WITHOUT CARDIOVERSION N/A 03/13/2016  ? Procedure: TRANSESOPHAGEAL ECHOCARDIOGRAM (TEE);  Surgeon: Ivin Poot, MD;  Location: [facility];  Service: Open Heart Surgery;  Laterality: N/A;  ? VSD REPAIR N/A 03/13/2016  ? Procedure: VENTRICULAR SEPTAL DEFECT (VSD) REPAIR;  Surgeon: Ivin Poot, MD;  Location: [facility];  Service: Open Heart Surgery;  Laterality: N/A;  ? ? ?Allergies ? ?Allergies  ?Allergen Reactions  ? Morphine And Related Shortness Of Breath and Other (See Comments)  ?  UNSPECIFIED REACTION ?"Pt said it was too much" ?  ? Latex Rash  ? ? ?History of Present Illness  ?  ?William [name] is a 71 y.o. male who presents via audio/video conferencing for a telehealth visit today.  Pt was last seen in cardiology clinic on 08/01/2021 by Dr. Fletcher Anon. At that time William [name] was doing well with no complaints of chest pain, SOB, or lower extremity claudication.  The patient is now pending circumcision procedure. Since his last visit, he is doing well with no complaints of chest pain or claudication at this time. ? ? ?Home Medications  ?  ?Prior to Admission medications   ?Medication Sig Start Date End Date Taking? Authorizing Provider  ?albuterol (PROVENTIL HFA;VENTOLIN HFA) 108 (90 Base) MCG/ACT inhaler Inhale 2 puffs into the lungs every 6 (six) hours as needed for wheezing or shortness of breath. 12/18/16   Dorena Dew, FNP  ?aspirin EC 81 MG tablet Take 1 tablet (81 mg total) by mouth daily. 09/21/16   Dorena Dew, FNP  ?carvedilol (COREG) 3.125 MG tablet TAKE 1 TABLET(3.125 MG) BY MOUTH TWICE DAILY WITH A MEAL 08/27/21   [name], MD  ?Continuous Blood Gluc Receiver (FREESTYLE LIBRE 2 READER) DEVI 1 Act by Does not apply route daily. 07/10/21   [name], MD  ?Continuous Blood Gluc Sensor (FREESTYLE LIBRE 2 SENSOR) MISC 1 Act by Does not apply route daily. 07/10/21   [name], MD  ?FARXIGA 10 MG TABS tablet TAKE 1 TABLET(10 MG) BY  MOUTH DAILY BEFORE BREAKFAST 08/29/21   [name], MD  ?gabapentin (NEURONTIN) 300 MG capsule Take 1 capsule (300 mg total) by mouth 2 (two) times daily. 05/22/21 05/22/22  [name], MD  ?Glucagon (GVOKE HYPOPEN 2-PACK) 1 MG/0.2ML SOAJ Inject 1 Act into the skin daily as needed. ?Patient not taking: Reported on 08/01/2021 11/22/20   [name], MD  ?HUMALOG KWIKPEN 100 UNIT/ML KwikPen INJECT 10 UNITS UNDER THE SKIN THREE TIMES DAILY AS DIRECTED BY SLIDING SCALE 10/14/21   [name], MD  ?insulin glargine (LANTUS SOLOSTAR) 100 UNIT/ML Solostar Pen INJECT 55 UNITS UNDER THE SKIN DAILY 10/06/21   [name], MD  ?Insulin Pen Needle 31G X 8 MM MISC 1 each by Does not apply route 4 (four) times daily. 09/19/20   Vevelyn Francois, NP  ?pantoprazole (PROTONIX) 40 MG tablet TAKE 1 TABLET(40 MG) BY MOUTH TWICE DAILY BEFORE A MEAL 08/29/21   [name], MD  ?pramipexole (MIRAPEX) 0.125 MG tablet TAKE 1 TABLET(0.125 MG) BY MOUTH AT BEDTIME 08/29/21   [name], MD  ?rosuvastatin (CRESTOR) 40 MG  tablet TAKE 1 TABLET BY MOUTH EVERY DAY 10/04/20   Vevelyn Francois, NP  ?torsemide (DEMADEX) 20 MG tablet TAKE 1 TABLET(20 MG) BY MOUTH 3 TIMES A WEEK 10/19/21   [name], MD  ?warfarin (COUMADIN) 5 MG tablet Take 1-2 tablets Daily or as prescribed by Coumadin Clinic. 08/28/21   Lelon Perla, MD  ? ? ?Physical Exam  ?  ?Vital Signs:  William [name] does not have vital signs available for review today. ? ?Given telephonic nature of communication, physical exam is limited. ?AAOx3. NAD. Normal affect.  Speech and respirations are unlabored. ? ?Accessory Clinical Findings  ?  ?None ? ?Assessment & Plan  ?  ?1.  Preoperative Cardiovascular Risk Assessment: ?The patient affirms he has been doing well without any new cardiac symptoms. They are able to achieve 4 METS without cardiac limitations. Therefore, based on ACC/AHA guidelines, the patient would be at acceptable risk for the planned procedure without  further cardiovascular testing. The patient was advised that if he develops new symptoms prior to surgery to contact our office to arrange for a follow-up visit, and he verbalized understanding.  ? ? ?RCRI: 11% (for in

## 2021-11-01 NOTE — Progress Notes (Signed)
DUE TO COVID-19 ONLY ONE VISITOR IS ALLOWED TO COME WITH YOU AND STAY IN THE WAITING ROOM ONLY DURING PRE OP AND PROCEDURE DAY OF SURGERY.  2 VISITOR  MAY VISIT WITH YOU AFTER SURGERY IN YOUR PRIVATE ROOM DURING VISITING HOURS ONLY! ?YOU MAY HAVE ONE PERSON SPEND THE NITE WITH YOU IN YOUR ROOM AFTER SURGERY.   ? ? Your procedure is scheduled on:  ?         11/07/2021  ? Report to [facility] Main  Entrance ? ? Report to admitting at   0515             AM ?DO NOT [place], PICTURE ID OR WALLET DAY OF SURGERY.  ?  ? ? Call this number if you have problems the morning of surgery [phone]  ? ? REMEMBER: NO  SOLID FOODS , CANDY, GUM OR MINTS AFTER MIDNITE THE NITE BEFORE SURGERY .       [name] CLEAR LIQUIDS UNTIL     0430am            DAY OF SURGERY.        ? ? ? ? ?CLEAR LIQUID DIET ? ? ?Foods Allowed      ?WATER ?BLACK COFFEE ( SUGAR OK, NO MILK, CREAM OR CREAMER) REGULAR AND DECAF  ?TEA ( SUGAR OK NO MILK, CREAM, OR CREAMER) REGULAR AND DECAF  ?PLAIN JELLO ( NO RED)  ?FRUIT ICES ( NO RED, NO FRUIT PULP)  ?POPSICLES ( NO RED)  ?JUICE- APPLE, WHITE GRAPE AND WHITE CRANBERRY  ?SPORT DRINK LIKE GATORADE ( NO RED)  ?CLEAR BROTH ( VEGETABLE , CHICKEN OR BEEF)                                                               ? ?    ? ?BRUSH YOUR TEETH MORNING OF SURGERY AND RINSE YOUR MOUTH OUT, NO CHEWING GUM CANDY OR MINTS. ?  ? ? Take these medicines the morning of surgery with A SIP OF WATER:  inhalers as usual and bring, coreg, protonix, gabapentin  ? ? ?DO NOT TAKE ANY DIABETIC MEDICATIONS DAY OF YOUR SURGERY ?                  ?            You may not have any metal on your body including hair pins and  ?            piercings  Do not wear jewelry, make-up, lotions, powders or perfumes, deodorant ?            Do not wear nail polish on your fingernails.   ?           IF YOU ARE A MALE AND WANT TO SHAVE UNDER ARMS OR LEGS PRIOR TO SURGERY YOU MUST DO SO AT LEAST 48 HOURS PRIOR TO SURGERY.  ?             Men may shave face and neck. ? ? Do not bring valuables to the hospital. [facility] NOT ?            RESPONSIBLE   FOR VALUABLES. ? Contacts, dentures or bridgework may not be worn into surgery. ? Leave suitcase  in the car. After surgery it may be brought to your room. ? ?  ? Patients discharged the day of surgery will not be allowed to drive home. IF YOU ARE HAVING SURGERY AND GOING HOME THE SAME DAY, YOU MUST HAVE AN ADULT TO DRIVE YOU HOME AND BE WITH YOU FOR 24 HOURS. YOU MAY GO HOME BY TAXI OR UBER OR ORTHERWISE, BUT AN ADULT MUST ACCOMPANY YOU HOME AND STAY WITH YOU FOR 24 HOURS. ?  ? ?            Please read over the following fact sheets you were given: ?_____________________________________________________________________ ? ?[facility] - Preparing for Surgery ?Before surgery, you can play an important role.  Because skin is not sterile, your skin needs to be as free of germs as possible.  You can reduce the number of germs on your skin by washing with CHG (chlorahexidine gluconate) soap before surgery.  CHG is an antiseptic cleaner which kills germs and bonds with the skin to continue killing germs even after washing. ?Please DO NOT use if you have an allergy to CHG or antibacterial soaps.  If your skin becomes reddened/irritated stop using the CHG and inform your nurse when you arrive at Short Stay. ?Do not shave (including legs and underarms) for at least 48 hours prior to the first CHG shower.  You may shave your face/neck. ?Please follow these instructions carefully: ? 1.  Shower with CHG Soap the night before surgery and the  morning of Surgery. ? 2.  If you choose to wash your hair, wash your hair first as usual with your  normal  shampoo. ? 3.  After you shampoo, rinse your hair and body thoroughly to remove the  shampoo.                           4.  Use CHG as you would any other liquid soap.  You can apply chg directly  to the skin and wash  ?                     Gently with a scrungie or  clean washcloth. ? 5.  Apply the CHG Soap to your body ONLY FROM THE NECK DOWN.   Do not use on face/ open      ?                     Wound or open sores. Avoid contact with eyes, ears mouth and genitals (private parts).  ?                     Production manager,  Genitals (private parts) with your normal soap. ?            6.  Wash thoroughly, paying special attention to the area where your surgery  will be performed. ? 7.  Thoroughly rinse your body with warm water from the neck down. ? 8.  DO NOT shower/wash with your normal soap after using and rinsing off  the CHG Soap. ?               9.  Pat yourself dry with a clean towel. ?           10.  Wear clean pajamas. ?           11.  Place clean sheets on your bed the night of your first shower and  do not  sleep with pets. ?Day of Surgery : ?Do not apply any lotions/deodorants the morning of surgery.  Please wear clean clothes to the hospital/surgery center. ? ?FAILURE TO FOLLOW THESE INSTRUCTIONS MAY RESULT IN THE CANCELLATION OF YOUR SURGERY ?PATIENT SIGNATURE_________________________________ ? ?NURSE SIGNATURE__________________________________ ? ?________________________________________________________________________  ? ? ?           ?

## 2021-11-01 NOTE — Progress Notes (Addendum)
Anesthesia Review: ? ?PCP: DR Scarlette Calico  ?Cardiologist : preop cardiovascular exam on 10/31/21 with Jaquelyn Bitter Dick,np  ?DR Fletcher Anon- cardiologist  ?Chest x-ray : ?EKG :08/01/21  ?Echo : ?Stress test: ?Cardiac Cath :  ?Activity level:  can do a flight of stairs without difficulty  ?Sleep Study/ CPAP : none  ?Fasting Blood Sugar :      / Checks Blood Sugar -- times a day:   ?Blood Thinner/ Instructions /Last Dose: ?ASA / Instructions/ Last Dose :  ?DM- type 2   ?Hgba1c- 10/09/21- 8.5 on 11/06/21- is 8.1- routed to Dr Cain Sieve  ?Has Freestyle Libre and checks at least 3x daily or more if not using [employer] ?Coumadin -  last dose on 11/01/2021 per daughter and pt  ?81 mg aspirin ?Daughter came with pt to preop appt.  Daughter lives with  father and mother.   PT showed up at 1100am for 300pm appt.  PT had 1145 am appt [facility] and confused the 2 appts.  Labs were drawn and vitals done for preop  .  Instructions for surgery along with hibiclens given to pt's daughter.  Will call pt and daughter at 300pm to review med hx and to complete preop instructions.  PT and daughter are aware to be expecting a phone call.   ?PT/[facility]R [facility] on 11/06/21- 1.0.  ?BMP done 11/06/21 routed to Dr Cain Sieve.  ?Preop appt completed with med hx and instructions completed via phone on 11/06/2021. Daughter, [name], reviewed med hx and preop instructions with nurse. Reviewed times of day pt takes meds with pt and daughter via phone  corrected on med list in epic.   Daughter was unsure of Sliding Scale on Humalog but knew pt does not cover untl greater than 150 on glucose.   Daughter stated pt was aware of Sliding Scale for Humalog.   PT was also on phone and voiced understanding  Pt cannot read per daughter.  Daughter will need to be with pt in Short Stay.  ?PT has already taken [place] today on 11/06/2021. Shawn Stall made aware.  No new orders given.   PT and daughter aware pt is to eat a healthy snack prior to bedtime.   Daughter states pt usually eats milk, and peanut butter crackers.   ?PT to take 1/2 of Lantus Insulin tonite before surgery on 11/06/2021.  Daughter and pt aware of Lantus Insulin  dose and to take 1/2 of Lantus Insulin dose nite before surgery.    Daughter and pt aware no diabetic meds am of surgery.   Also reviewed what other meds pt is to take day of surgery as listed on preop instructions.   ?Daughter and pt voiced understanding.   ?

## 2021-11-02 ENCOUNTER — Telehealth: Payer: Self-pay | Admitting: Internal Medicine

## 2021-11-02 NOTE — H&P (View-Only) (Signed)
Anesthesia Chart Review ? ? Case: [id number] Date/Time: 11/07/21 0715  ? Procedure: CIRCUMCISION ADULT  ? Anesthesia type: General  ? Pre-op diagnosis: PENILE FORESKIN LESION  BALANITIS  ? Location: [facility] RO[facility] 04 [facility]  ? Surgeons: Vira Agar, MD  ? ?  ? ? ?DISCUSSION:70 y.o. former smoker with h/o HTN, DM II, CHF, PAF, penile foreskin lesion, balanitis scheduled for above procedure 11/07/2021 with Dr. Terrilee Files.  ? ?Pt seen by cardiology 10/31/2021. Per OV note, "Preoperative Cardiovascular Risk Assessment: ?The patient affirms he has been doing well without any new cardiac symptoms. They are able to achieve 4 METS without cardiac limitations. Therefore, based on ACC/AHA guidelines, the patient would be at acceptable risk for the planned procedure without further cardiovascular testing. The patient was advised that if he develops new symptoms prior to surgery to contact our office to arrange for a follow-up visit, and he verbalized understanding. " ? ?Anticipate pt can proceed with planned procedure barring acute status change.   ?VS: There were no vitals taken for this visit. ? ?PROVIDERS: ?[name], MD is PCP  ? ?Primary Cardiologist:  [name], MD ?LABS: Labs reviewed: Acceptable for surgery. ?(all labs ordered are listed, but only abnormal results are displayed) ? ?Labs Reviewed - No data to display ? ? ?IMAGES: ? ? ?EKG: ?08/01/2021 ?Rate 72 bpm  ?NSR ?Inferior infarct, age undetermined ?Anterolateral infarct, age undetermined ? ?CV: ?Echo 09/10/2016 ?- Left ventricle: The cavity size was normal. Wall thickness was  ?  normal. Systolic function was mildly reduced. The estimated  ?  ejection fraction was in the range of 45% to 50%. There is  ?  akinesis of the inferior myocardium. There is akinesis of the  ?  septal myocardium. Doppler parameters are consistent with  ?  abnormal left ventricular relaxation (grade 1 diastolic  ?  dysfunction).  ?- Mitral valve: There was mild regurgitation.  ?-  Left atrium: The atrium was mildly dilated.  ?Past Medical History:  ?Diagnosis Date  ? AKI (acute kidney injury) ([place])   ? With STEMI in 2017  ? Chronic systolic CHF (congestive heart failure) ([place])   ? Diabetes mellitus without complication ([place])   ? Diabetic retinopathy ([place])   ? Hx of adenomatous colonic polyps 04/07/2018  ? Hyperlipidemia   ? Hypertension   ? Hypertensive retinopathy   ? Paroxysmal atrial fibrillation ([name])   ? STEMI (ST elevation myocardial infarction) ([place]) 2017  ? ? ?Past Surgical History:  ?Procedure Laterality Date  ? ABDOMINAL AORTOGRAM W/LOWER EXTREMITY N/A 09/19/2016  ? Procedure: Abdominal Aortogram w/Lower Extremity;  Surgeon: Wellington Hampshire, MD;  Location: [place];  Service: Cardiovascular;  Laterality: N/A;  ? AMPUTATION Right 03/23/2016  ? Procedure: 1st and 2nd Ray Amputation Right Foot;  Surgeon: Newt Minion, MD;  Location: [place];  Service: Orthopedics;  Laterality: Right;  ? AMPUTATION Right 06/21/2016  ? Procedure: RIGHT TRANSMETATARSAL AMPUTATION;  Surgeon: Newt Minion, MD;  Location: [place];  Service: Orthopedics;  Laterality: Right;  ? CARDIAC CATHETERIZATION N/A 03/13/2016  ? Procedure: Right/Left Heart Cath and Coronary Angiography;  Surgeon: Sherren Mocha, MD;  Location: [place];  Service: Cardiovascular;  Laterality: N/A;  ? CARDIAC CATHETERIZATION N/A 03/13/2016  ? Procedure: IABP Insertion;  Surgeon: Sherren Mocha, MD;  Location: [facility];  Service: Cardiovascular;  Laterality: N/A;  ? CATARACT EXTRACTION    ? CATARACT EXTRACTION, BILATERAL    ? CERVICAL FUSION  1982, 1992  ? has had 3 neck surgeries from breaking his neck  ? CORONARY ARTERY BYPASS GRAFT N/A 03/13/2016  ? Procedure: CORONARY ARTERY BYPASS GRAFTING (CABG) x 1 (SVG to OM) with EVH from [place];  Surgeon: Ivin Poot, MD;  Location: [place] [facility];  Service: Open Heart Surgery;  Laterality: N/A;  ? EYE SURGERY    ? LOWER EXTREMITY ANGIOGRAM  05/02/2016   ? Procedure: Lower Extremity Angiogram;  Surgeon: Wellington Hampshire, MD;  Location: [facility];  Service: Cardiovascular;;  Limited left femoral runoff ?right femoral runoff  ? PERIPHERAL VASCULAR CATHETERIZATION N/A 05/02/2016  ? Procedure: Abdominal Aortogram;  Surgeon: Wellington Hampshire, MD;  Location: [facility];  Service: Cardiovascular;  Laterality: N/A;  ? PERIPHERAL VASCULAR CATHETERIZATION Right 05/02/2016  ? Procedure: Peripheral Vascular Balloon Angioplasty;  Surgeon: Wellington Hampshire, MD;  Location: [facility];  Service: Cardiovascular;  Laterality: Right;  SFA  ? TEE WITHOUT CARDIOVERSION N/A 03/13/2016  ? Procedure: TRANSESOPHAGEAL ECHOCARDIOGRAM (TEE);  Surgeon: Ivin Poot, MD;  Location: [place];  Service: Open Heart Surgery;  Laterality: N/A;  ? VSD REPAIR N/A 03/13/2016  ? Procedure: VENTRICULAR SEPTAL DEFECT (VSD) REPAIR;  Surgeon: Ivin Poot, MD;  Location: [facility];  Service: Open Heart Surgery;  Laterality: N/A;  ? ? ?MEDICATIONS: ? albuterol (PROVENTIL HFA;VENTOLIN HFA) 108 (90 Base) MCG/ACT inhaler  ? aspirin EC 81 MG tablet  ? carvedilol (COREG) 3.125 MG tablet  ? Continuous Blood Gluc Receiver (FREESTYLE LIBRE 2 READER) DEVI  ? Continuous Blood Gluc Sensor (FREESTYLE LIBRE 2 SENSOR) MISC  ? FARXIGA 10 MG TABS tablet  ? gabapentin (NEURONTIN) 300 MG capsule  ? Glucagon (GVOKE HYPOPEN 2-PACK) 1 MG/0.2ML SOAJ  ? HUMALOG KWIKPEN 100 UNIT/ML KwikPen  ? insulin glargine (LANTUS SOLOSTAR) 100 UNIT/ML Solostar Pen  ? Insulin Pen Needle 31G X 8 MM MISC  ? pantoprazole (PROTONIX) 40 MG tablet  ? pramipexole (MIRAPEX) 0.125 MG tablet  ? rosuvastatin (CRESTOR) 40 MG tablet  ? torsemide (DEMADEX) 20 MG tablet  ? warfarin (COUMADIN) 5 MG tablet  ? ?No current facility-administered medications for this encounter.  ? ? ? ?[name], PA-C ?[facility] ?[phone] ? ? ? ? ?

## 2021-11-02 NOTE — Anesthesia Preprocedure Evaluation (Addendum)
Anesthesia Evaluation  ?Patient identified by MRN, date of birth, ID band ?Patient awake ? ? ? ?Reviewed: ?Allergy & Precautions, NPO status , Patient's Chart, lab work & pertinent test results ? ?History of Anesthesia Complications ?Negative for: history of anesthetic complications ? ?Airway ?Mallampati: II ? ?TM Distance: >3 FB ?Neck ROM: Full ? ? ? Dental ? ?(+) Dental Advisory Given, Edentulous Upper, Edentulous Lower ?  ?Pulmonary ?neg pulmonary ROS, former smoker,  ?  ?Pulmonary exam normal ? ? ? ? ? ? ? Cardiovascular ?Exercise Tolerance: Good ?hypertension, Pt. on medications and Pt. on home beta blockers ?+ CAD, + Past MI (2017), + CABG (2017), + Peripheral Vascular Disease and +CHF  ?Normal cardiovascular exam+ dysrhythmias Atrial Fibrillation  ? ? ?Echo 2018: EF 45-50%, akinesis of inferior myocardium, g1dd, mild MR ?  ?Neuro/Psych ?negative neurological ROS ?   ? GI/Hepatic ?Neg liver ROS, GERD  ,  ?Endo/Other  ?diabetes, Type 2, Insulin Dependent ? Renal/GU ?CRFRenal disease (CKD3, Cr 1.57)  ?negative genitourinary ?  ?Musculoskeletal ?negative musculoskeletal ROS ?(+)  ? Abdominal ?  ?Peds ? Hematology ?Coumadin   ?Anesthesia Other Findings ? ? Reproductive/Obstetrics ? ?  ? ? ? ? ? ? ? ? ? ? ? ? ? ?  ?  ? ? ? ? ? ?Anesthesia Physical ?Anesthesia Plan ? ?ASA: 3 ? ?Anesthesia Plan: General  ? ?Post-op Pain Management: Tylenol PO (pre-op)*  ? ?Induction: Intravenous ? ?PONV Risk Score and Plan: 2 and Ondansetron, Dexamethasone, Midazolam and Treatment may vary due to age or medical condition ? ?Airway Management Planned: LMA ? ?Additional Equipment: None ? ?Intra-op Plan:  ? ?Post-operative Plan: Extubation in OR ? ?Informed Consent: I have reviewed the patients History and Physical, chart, labs and discussed the procedure including the risks, benefits and alternatives for the proposed anesthesia with the patient or authorized representative who has indicated his/her  understanding and acceptance.  ? ? ? ?Dental advisory given ? ?Plan Discussed with:  ? ?Anesthesia Plan Comments: (See PAT note 11/06/2021)  ? ? ? ? ?Anesthesia Quick Evaluation ? ?

## 2021-11-02 NOTE — Telephone Encounter (Signed)
Pt's daughter, [name], has been informed and expressed understanding.  ?

## 2021-11-02 NOTE — Telephone Encounter (Signed)
Reports that Warfarin was stopped for circumcision surgery on 5.9. and they advised pt follow up with PCP to see if Aspirin should be stopped as well.  ?

## 2021-11-02 NOTE — Progress Notes (Signed)
Anesthesia Chart Review ? ? Case: [id number] Date/Time: 11/07/21 0715  ? Procedure: CIRCUMCISION ADULT  ? Anesthesia type: General  ? Pre-op diagnosis: PENILE FORESKIN LESION  BALANITIS  ? Location: [facility] 04 [facility]  ? Surgeons: William Agar, MD  ? ?  ? ? ?DISCUSSION:70 y.o. former smoker with h/o HTN, DM II, CHF, PAF, penile foreskin lesion, balanitis scheduled for above procedure 11/07/2021 with Dr. Terrilee [name].  ? ?Pt seen by cardiology 10/31/2021. Per OV note, "Preoperative Cardiovascular Risk Assessment: ?The patient affirms he has been doing well without any new cardiac symptoms. They are able to achieve 4 METS without cardiac limitations. Therefore, based on ACC/AHA guidelines, the patient would be at acceptable risk for the planned procedure without further cardiovascular testing. The patient was advised that if he develops new symptoms prior to surgery to contact our office to arrange for a follow-up visit, and he verbalized understanding. " ? ?Anticipate pt can proceed with planned procedure barring acute status change.   ?VS: There were no vitals taken for this visit. ? ?PROVIDERS: ?William Lima, MD is PCP  ? ?Primary Cardiologist:  William Sacramento, MD ?LABS: Labs reviewed: Acceptable for surgery. ?(all labs ordered are listed, but only abnormal results are displayed) ? ?Labs Reviewed - No data to display ? ? ?IMAGES: ? ? ?EKG: ?08/01/2021 ?Rate 72 bpm  ?NSR ?Inferior infarct, age undetermined ?Anterolateral infarct, age undetermined ? ?CV: ?Echo 09/10/2016 ?- Left ventricle: The cavity size was normal. Wall thickness was  ?  normal. Systolic function was mildly reduced. The estimated  ?  ejection fraction was in the range of 45% to 50%. There is  ?  akinesis of the inferior myocardium. There is akinesis of the  ?  septal myocardium. Doppler parameters are consistent with  ?  abnormal left ventricular relaxation (grade 1 diastolic  ?  dysfunction).  ?- Mitral valve: There was mild regurgitation.  ?-  Left atrium: The atrium was mildly dilated.  ?Past Medical History:  ?Diagnosis Date  ? AKI (acute kidney injury) ([place])   ? With STEMI in 2017  ? Chronic systolic CHF (congestive heart failure) ([place])   ? Diabetes mellitus without complication ([place])   ? Diabetic retinopathy ([place])   ? Hx of adenomatous colonic polyps 04/07/2018  ? Hyperlipidemia   ? Hypertension   ? Hypertensive retinopathy   ? Paroxysmal atrial fibrillation ([name])   ? STEMI (ST elevation myocardial infarction) ([place]) 2017  ? ? ?Past Surgical History:  ?Procedure Laterality Date  ? ABDOMINAL AORTOGRAM W/LOWER EXTREMITY N/A 09/19/2016  ? Procedure: Abdominal Aortogram w/Lower Extremity;  Surgeon: William Hampshire, MD;  Location: [facility];  Service: Cardiovascular;  Laterality: N/A;  ? AMPUTATION Right 03/23/2016  ? Procedure: 1st and 2nd Ray Amputation Right Foot;  Surgeon: William Minion, MD;  Location: [facility];  Service: Orthopedics;  Laterality: Right;  ? AMPUTATION Right 06/21/2016  ? Procedure: RIGHT TRANSMETATARSAL AMPUTATION;  Surgeon: William Minion, MD;  Location: [place];  Service: Orthopedics;  Laterality: Right;  ? CARDIAC CATHETERIZATION N/A 03/13/2016  ? Procedure: Right/Left Heart Cath and Coronary Angiography;  Surgeon: William Mocha, MD;  Location: [facility];  Service: Cardiovascular;  Laterality: N/A;  ? CARDIAC CATHETERIZATION N/A 03/13/2016  ? Procedure: IABP Insertion;  Surgeon: William Mocha, MD;  Location: [facility];  Service: Cardiovascular;  Laterality: N/A;  ? CATARACT EXTRACTION    ? CATARACT EXTRACTION, BILATERAL    ? CERVICAL FUSION  1982, 1992  ? has had 3 neck surgeries from breaking his neck  ? CORONARY ARTERY BYPASS GRAFT N/A 03/13/2016  ? Procedure: CORONARY ARTERY BYPASS GRAFTING (CABG) x 1 (SVG to OM) with EVH from [place];  Surgeon: William Poot, MD;  Location: [facility];  Service: Open Heart Surgery;  Laterality: N/A;  ? EYE SURGERY    ? LOWER EXTREMITY ANGIOGRAM  05/02/2016   ? Procedure: Lower Extremity Angiogram;  Surgeon: William Hampshire, MD;  Location: [facility];  Service: Cardiovascular;;  Limited left femoral runoff ?right femoral runoff  ? PERIPHERAL VASCULAR CATHETERIZATION N/A 05/02/2016  ? Procedure: Abdominal Aortogram;  Surgeon: William Hampshire, MD;  Location: [facility];  Service: Cardiovascular;  Laterality: N/A;  ? PERIPHERAL VASCULAR CATHETERIZATION Right 05/02/2016  ? Procedure: Peripheral Vascular Balloon Angioplasty;  Surgeon: William Hampshire, MD;  Location: [facility];  Service: Cardiovascular;  Laterality: Right;  SFA  ? TEE WITHOUT CARDIOVERSION N/A 03/13/2016  ? Procedure: TRANSESOPHAGEAL ECHOCARDIOGRAM (TEE);  Surgeon: William Poot, MD;  Location: [place] [facility];  Service: Open Heart Surgery;  Laterality: N/A;  ? VSD REPAIR N/A 03/13/2016  ? Procedure: VENTRICULAR SEPTAL DEFECT (VSD) REPAIR;  Surgeon: William Poot, MD;  Location: [facility];  Service: Open Heart Surgery;  Laterality: N/A;  ? ? ?MEDICATIONS: ? albuterol (PROVENTIL HFA;VENTOLIN HFA) 108 (90 Base) MCG/ACT inhaler  ? aspirin EC 81 MG tablet  ? carvedilol (COREG) 3.125 MG tablet  ? Continuous Blood Gluc Receiver (FREESTYLE LIBRE 2 READER) DEVI  ? Continuous Blood Gluc Sensor (FREESTYLE LIBRE 2 SENSOR) MISC  ? FARXIGA 10 MG TABS tablet  ? gabapentin (NEURONTIN) 300 MG capsule  ? Glucagon (GVOKE HYPOPEN 2-PACK) 1 MG/0.2ML SOAJ  ? HUMALOG KWIKPEN 100 UNIT/ML KwikPen  ? insulin glargine (LANTUS SOLOSTAR) 100 UNIT/ML Solostar Pen  ? Insulin Pen Needle 31G X 8 MM MISC  ? pantoprazole (PROTONIX) 40 MG tablet  ? pramipexole (MIRAPEX) 0.125 MG tablet  ? rosuvastatin (CRESTOR) 40 MG tablet  ? torsemide (DEMADEX) 20 MG tablet  ? warfarin (COUMADIN) 5 MG tablet  ? ?No current facility-administered medications for this encounter.  ? ? ? ?William Felix Ward, PA-C ?[facility] ?[phone] ? ? ? ? ?

## 2021-11-06 ENCOUNTER — Other Ambulatory Visit: Payer: Self-pay

## 2021-11-06 ENCOUNTER — Ambulatory Visit (INDEPENDENT_AMBULATORY_CARE_PROVIDER_SITE_OTHER): Payer: Medicare Other

## 2021-11-06 ENCOUNTER — Encounter (HOSPITAL_COMMUNITY): Payer: Self-pay

## 2021-11-06 ENCOUNTER — Encounter (HOSPITAL_COMMUNITY)
Admission: RE | Admit: 2021-11-06 | Discharge: 2021-11-06 | Disposition: A | Discharge status: Home / Self Care | Payer: Medicare Other | Source: Ambulatory Visit | Attending: Urology | Admitting: Urology

## 2021-11-06 VITALS — BP 116/62 | HR 60 | Temp 98.0°F | Resp 18 | Ht 65.0 in | Wt 182.0 lb

## 2021-11-06 DIAGNOSIS — I5022 Chronic systolic (congestive) heart failure: Secondary | ICD-10-CM | POA: Diagnosis not present

## 2021-11-06 DIAGNOSIS — Z5181 Encounter for therapeutic drug level monitoring: Secondary | ICD-10-CM | POA: Diagnosis not present

## 2021-11-06 DIAGNOSIS — N481 Balanitis: Secondary | ICD-10-CM | POA: Insufficient documentation

## 2021-11-06 DIAGNOSIS — I110 Hypertensive heart disease with heart failure: Secondary | ICD-10-CM | POA: Insufficient documentation

## 2021-11-06 DIAGNOSIS — E11319 Type 2 diabetes mellitus with unspecified diabetic retinopathy without macular edema: Secondary | ICD-10-CM | POA: Diagnosis not present

## 2021-11-06 DIAGNOSIS — Z01818 Encounter for other preprocedural examination: Secondary | ICD-10-CM

## 2021-11-06 DIAGNOSIS — Z87891 Personal history of nicotine dependence: Secondary | ICD-10-CM | POA: Insufficient documentation

## 2021-11-06 DIAGNOSIS — I480 Paroxysmal atrial fibrillation: Secondary | ICD-10-CM

## 2021-11-06 DIAGNOSIS — Z01812 Encounter for preprocedural laboratory examination: Secondary | ICD-10-CM | POA: Diagnosis not present

## 2021-11-06 HISTORY — DX: Peripheral vascular disease, unspecified: I73.9

## 2021-11-06 HISTORY — DX: Unspecified convulsions: R56.9

## 2021-11-06 HISTORY — DX: Pneumonia, unspecified organism: J18.9

## 2021-11-06 HISTORY — DX: Depression, unspecified: F32.A

## 2021-11-06 HISTORY — DX: Gastro-esophageal reflux disease without esophagitis: K21.9

## 2021-11-06 LAB — BASIC METABOLIC PANEL
Anion gap: 6 (ref 5–15)
BUN: 27 mg/dL — ABNORMAL HIGH (ref 8–23)
CO2: 26 mmol/L (ref 22–32)
Calcium: 9.3 mg/dL (ref 8.9–10.3)
Chloride: 107 mmol/L (ref 98–111)
Creatinine, Ser: 1.57 mg/dL — ABNORMAL HIGH (ref 0.61–1.24)
GFR, Estimated: 47 mL/min — ABNORMAL LOW (ref >60)
Glucose, Bld: 190 mg/dL — ABNORMAL HIGH (ref 70–99)
Potassium: 4.8 mmol/L (ref 3.5–5.1)
Sodium: 139 mmol/L (ref 135–145)

## 2021-11-06 LAB — CBC
HCT: 39.4 % (ref 39.0–52.0)
Hemoglobin: 13.3 g/dL (ref 13.0–17.0)
MCH: 30 pg (ref 26.0–34.0)
MCHC: 33.8 g/dL (ref 30.0–36.0)
MCV: 88.9 fL (ref 80.0–100.0)
Platelets: 147 10*3/uL — ABNORMAL LOW (ref 150–400)
RBC: 4.43 MIL/uL (ref 4.22–5.81)
RDW: 14.3 % (ref 11.5–15.5)
WBC: 5.9 10*3/uL (ref 4.0–10.5)
nRBC: 0 % (ref 0.0–0.2)

## 2021-11-06 LAB — GLUCOSE, CAPILLARY: Glucose-Capillary: 191 mg/dL — ABNORMAL HIGH (ref 70–99)

## 2021-11-06 LAB — POCT INR: INR: 1 — AB (ref 2.0–3.0)

## 2021-11-06 LAB — HEMOGLOBIN A1C
Hgb A1c MFr Bld: 8.1 % — ABNORMAL HIGH (ref 4.8–5.6)
Mean Plasma Glucose: 185.77 mg/dL

## 2021-11-06 NOTE — Patient Instructions (Signed)
AFTER SURGERY, Continue taking 1.5 tablets daily except for 1 tablet every Monday, Wednesday and Friday.  Recheck INR in 1 week. Surgery 5/9. Held 5 days. ?

## 2021-11-07 ENCOUNTER — Encounter (HOSPITAL_COMMUNITY): Payer: Self-pay | Admitting: Urology

## 2021-11-07 ENCOUNTER — Other Ambulatory Visit: Payer: Self-pay

## 2021-11-07 ENCOUNTER — Ambulatory Visit (HOSPITAL_COMMUNITY)
Admission: RE | Admit: 2021-11-07 | Discharge: 2021-11-07 | Disposition: A | Discharge status: Home / Self Care | Payer: Medicare Other | Attending: Urology | Admitting: Urology

## 2021-11-07 ENCOUNTER — Encounter (HOSPITAL_COMMUNITY)
Admission: RE | Disposition: A | Discharge status: Home / Self Care | Payer: Self-pay | Source: Home / Self Care | Attending: Urology

## 2021-11-07 ENCOUNTER — Ambulatory Visit (HOSPITAL_COMMUNITY): Payer: Medicare Other | Admitting: Physician Assistant

## 2021-11-07 ENCOUNTER — Ambulatory Visit (HOSPITAL_BASED_OUTPATIENT_CLINIC_OR_DEPARTMENT_OTHER): Payer: Medicare Other | Admitting: Physician Assistant

## 2021-11-07 DIAGNOSIS — E119 Type 2 diabetes mellitus without complications: Secondary | ICD-10-CM | POA: Insufficient documentation

## 2021-11-07 DIAGNOSIS — N481 Balanitis: Secondary | ICD-10-CM | POA: Insufficient documentation

## 2021-11-07 DIAGNOSIS — N183 Chronic kidney disease, stage 3 unspecified: Secondary | ICD-10-CM | POA: Diagnosis not present

## 2021-11-07 DIAGNOSIS — N471 Phimosis: Secondary | ICD-10-CM | POA: Diagnosis not present

## 2021-11-07 DIAGNOSIS — Z01818 Encounter for other preprocedural examination: Secondary | ICD-10-CM

## 2021-11-07 DIAGNOSIS — I130 Hypertensive heart and chronic kidney disease with heart failure and stage 1 through stage 4 chronic kidney disease, or unspecified chronic kidney disease: Secondary | ICD-10-CM | POA: Insufficient documentation

## 2021-11-07 DIAGNOSIS — I252 Old myocardial infarction: Secondary | ICD-10-CM | POA: Insufficient documentation

## 2021-11-07 DIAGNOSIS — Z87891 Personal history of nicotine dependence: Secondary | ICD-10-CM | POA: Diagnosis not present

## 2021-11-07 DIAGNOSIS — E1151 Type 2 diabetes mellitus with diabetic peripheral angiopathy without gangrene: Secondary | ICD-10-CM | POA: Diagnosis not present

## 2021-11-07 DIAGNOSIS — Z79899 Other long term (current) drug therapy: Secondary | ICD-10-CM | POA: Diagnosis not present

## 2021-11-07 DIAGNOSIS — I509 Heart failure, unspecified: Secondary | ICD-10-CM | POA: Diagnosis not present

## 2021-11-07 DIAGNOSIS — Z951 Presence of aortocoronary bypass graft: Secondary | ICD-10-CM | POA: Diagnosis not present

## 2021-11-07 DIAGNOSIS — I2510 Atherosclerotic heart disease of native coronary artery without angina pectoris: Secondary | ICD-10-CM | POA: Diagnosis not present

## 2021-11-07 DIAGNOSIS — N4889 Other specified disorders of penis: Secondary | ICD-10-CM

## 2021-11-07 DIAGNOSIS — Z794 Long term (current) use of insulin: Secondary | ICD-10-CM | POA: Diagnosis not present

## 2021-11-07 DIAGNOSIS — I480 Paroxysmal atrial fibrillation: Secondary | ICD-10-CM | POA: Diagnosis not present

## 2021-11-07 HISTORY — PX: CIRCUMCISION: SHX1350

## 2021-11-07 LAB — PROTIME-INR
INR: 1 (ref 0.8–1.2)
Prothrombin Time: 13.4 seconds (ref 11.4–15.2)

## 2021-11-07 LAB — GLUCOSE, CAPILLARY
Glucose-Capillary: 143 mg/dL — ABNORMAL HIGH (ref 70–99)
Glucose-Capillary: 111 mg/dL — ABNORMAL HIGH (ref 70–99)

## 2021-11-07 SURGERY — CIRCUMCISION, ADULT
Anesthesia: General

## 2021-11-07 MED ORDER — MIDAZOLAM HCL 5 MG/5ML IJ SOLN
INTRAMUSCULAR | Status: DC | PRN
Start: 2021-11-07 — End: 2021-11-07
  Administered 2021-11-07: 1 mg via INTRAVENOUS

## 2021-11-07 MED ORDER — BUPIVACAINE-EPINEPHRINE (PF) 0.25% -1:200000 IJ SOLN
INTRAMUSCULAR | Status: AC
  Filled 2021-11-07: qty 30

## 2021-11-07 MED ORDER — FENTANYL CITRATE (PF) 100 MCG/2ML IJ SOLN
INTRAMUSCULAR | Status: DC | PRN
  Administered 2021-11-07: 50 ug via INTRAVENOUS

## 2021-11-07 MED ORDER — CHLORHEXIDINE GLUCONATE 0.12 % MT SOLN
15.0000 mL | Freq: Once | OROMUCOSAL | Status: AC
  Administered 2021-11-07: 15 mL via OROMUCOSAL

## 2021-11-07 MED ORDER — MIDAZOLAM HCL 2 MG/2ML IJ SOLN
INTRAMUSCULAR | Status: AC
  Filled 2021-11-07: qty 2

## 2021-11-07 MED ORDER — PHENYLEPHRINE 80 MCG/ML (10ML) SYRINGE FOR IV PUSH (FOR BLOOD PRESSURE SUPPORT)
PREFILLED_SYRINGE | INTRAVENOUS | Status: AC
  Filled 2021-11-07: qty 10

## 2021-11-07 MED ORDER — LIDOCAINE HCL (CARDIAC) PF 100 MG/5ML IV SOSY
PREFILLED_SYRINGE | INTRAVENOUS | Status: DC | PRN
Start: 2021-11-07 — End: 2021-11-07
  Administered 2021-11-07: 50 mg via INTRAVENOUS

## 2021-11-07 MED ORDER — FENTANYL CITRATE PF 50 MCG/ML IJ SOSY: 25.0000 ug | PREFILLED_SYRINGE | INTRAMUSCULAR | Status: DC | PRN

## 2021-11-07 MED ORDER — LIDOCAINE HCL 1 % IJ SOLN
INTRAMUSCULAR | Status: DC | PRN
  Administered 2021-11-07: 30 mL

## 2021-11-07 MED ORDER — BUPIVACAINE HCL (PF) 0.25 % IJ SOLN
INTRAMUSCULAR | Status: AC
  Filled 2021-11-07: qty 30

## 2021-11-07 MED ORDER — EPHEDRINE 5 MG/ML INJ
INTRAVENOUS | Status: AC
  Filled 2021-11-07: qty 5

## 2021-11-07 MED ORDER — ONDANSETRON HCL 4 MG/2ML IJ SOLN: 4.0000 mg | Freq: Once | INTRAMUSCULAR | Status: DC | PRN

## 2021-11-07 MED ORDER — AMISULPRIDE (ANTIEMETIC) 5 MG/2ML IV SOLN: 10.0000 mg | Freq: Once | INTRAVENOUS | Status: DC | PRN

## 2021-11-07 MED ORDER — EPHEDRINE SULFATE (PRESSORS) 50 MG/ML IJ SOLN
INTRAMUSCULAR | Status: DC | PRN
  Administered 2021-11-07 (×4): 5 mg via INTRAVENOUS

## 2021-11-07 MED ORDER — ONDANSETRON HCL 4 MG/2ML IJ SOLN
INTRAMUSCULAR | Status: AC
  Filled 2021-11-07: qty 2

## 2021-11-07 MED ORDER — FENTANYL CITRATE (PF) 100 MCG/2ML IJ SOLN
INTRAMUSCULAR | Status: AC
  Filled 2021-11-07: qty 2

## 2021-11-07 MED ORDER — LACTATED RINGERS IV SOLN: INTRAVENOUS | Status: DC

## 2021-11-07 MED ORDER — PROPOFOL 10 MG/ML IV BOLUS
INTRAVENOUS | Status: DC | PRN
  Administered 2021-11-07: 50 mg via INTRAVENOUS
  Administered 2021-11-07: 100 mg via INTRAVENOUS

## 2021-11-07 MED ORDER — CEFAZOLIN SODIUM-DEXTROSE 2-4 GM/100ML-% IV SOLN
2.0000 g | INTRAVENOUS | Status: AC
  Administered 2021-11-07: 2 g via INTRAVENOUS
  Filled 2021-11-07: qty 100

## 2021-11-07 MED ORDER — LIDOCAINE-EPINEPHRINE (PF) 1 %-1:200000 IJ SOLN
INTRAMUSCULAR | Status: AC
  Filled 2021-11-07: qty 30

## 2021-11-07 MED ORDER — OXYCODONE-ACETAMINOPHEN 5-325 MG PO TABS: 1.0000 | ORAL_TABLET | Freq: Four times a day (QID) | ORAL | 0 refills | Status: DC | PRN

## 2021-11-07 MED ORDER — BUPIVACAINE HCL (PF) 0.25 % IJ SOLN
INTRAMUSCULAR | Status: DC | PRN
  Administered 2021-11-07: 30 mL

## 2021-11-07 MED ORDER — PROPOFOL 10 MG/ML IV BOLUS
INTRAVENOUS | Status: AC
  Filled 2021-11-07: qty 20

## 2021-11-07 MED ORDER — LIDOCAINE HCL (PF) 1 % IJ SOLN
INTRAMUSCULAR | Status: AC
  Filled 2021-11-07: qty 30

## 2021-11-07 MED ORDER — OXYCODONE HCL 5 MG/5ML PO SOLN: 5.0000 mg | Freq: Once | ORAL | Status: DC | PRN

## 2021-11-07 MED ORDER — ORAL CARE MOUTH RINSE: 15.0000 mL | Freq: Once | OROMUCOSAL | Status: AC

## 2021-11-07 MED ORDER — CELECOXIB 100 MG PO CAPS: 100.0000 mg | ORAL_CAPSULE | Freq: Two times a day (BID) | ORAL | 1 refills | Status: DC | PRN

## 2021-11-07 MED ORDER — ONDANSETRON HCL 4 MG/2ML IJ SOLN
INTRAMUSCULAR | Status: DC | PRN
  Administered 2021-11-07: 4 mg via INTRAVENOUS

## 2021-11-07 MED ORDER — OXYCODONE HCL 5 MG PO TABS: 5.0000 mg | ORAL_TABLET | Freq: Once | ORAL | Status: DC | PRN

## 2021-11-07 MED ORDER — ACETAMINOPHEN 500 MG PO TABS
1000.0000 mg | ORAL_TABLET | Freq: Once | ORAL | Status: AC
  Administered 2021-11-07: 1000 mg via ORAL
  Filled 2021-11-07: qty 2

## 2021-11-07 SURGICAL SUPPLY — 30 items
BAG COUNTER SPONGE SURGICOUNT (BAG) IMPLANT
BAG SPNG CNTER NS LX DISP (BAG)
BLADE SURG 15 STRL LF DISP TIS (BLADE) ×1 IMPLANT
BLADE SURG 15 STRL SS (BLADE) ×4
BNDG COHESIVE 1X5 TAN STRL LF (GAUZE/BANDAGES/DRESSINGS) ×2 IMPLANT
BNDG CONFORM 2 STRL LF (GAUZE/BANDAGES/DRESSINGS) ×2 IMPLANT
COVER SURGICAL LIGHT HANDLE (MISCELLANEOUS) ×2 IMPLANT
DRAPE LAPAROTOMY T 98X78 PEDS (DRAPES) ×2 IMPLANT
ELECT NDL TIP 2.8 STRL (NEEDLE) ×1 IMPLANT
ELECT NEEDLE TIP 2.8 STRL (NEEDLE) ×2 IMPLANT
ELECT PENCIL ROCKER SW 15FT (MISCELLANEOUS) ×1 IMPLANT
ELECT REM PT RETURN 15FT ADLT (MISCELLANEOUS) ×2 IMPLANT
GAUZE PETROLATUM 1 X8 (GAUZE/BANDAGES/DRESSINGS) ×2 IMPLANT
GLOVE SS BIOGEL STRL SZ 7 (GLOVE) ×1 IMPLANT
GLOVE SUPERSENSE BIOGEL SZ 7 (GLOVE) ×1
GLOVE SURG LX 7.5 STRW (GLOVE) ×1
GLOVE SURG LX STRL 7.5 STRW (GLOVE) ×1 IMPLANT
KIT BASIN OR (CUSTOM PROCEDURE TRAY) ×2 IMPLANT
KIT TURNOVER KIT A (KITS) IMPLANT
NS IRRIG 1000ML POUR BTL (IV SOLUTION) ×2 IMPLANT
PACK BASIC VI WITH GOWN DISP (CUSTOM PROCEDURE TRAY) ×2 IMPLANT
PENCIL SMOKE EVACUATOR (MISCELLANEOUS) ×1 IMPLANT
SUT CHROMIC 3 0 SH 27 (SUTURE) ×4 IMPLANT
SUT CHROMIC 4 0 RB 1X27 (SUTURE) ×2 IMPLANT
SUT PROLENE 4 0 P 3 18 (SUTURE) IMPLANT
SYR CONTROL 10ML LL (SYRINGE) ×2 IMPLANT
TOWEL OR 17X26 10 PK STRL BLUE (TOWEL DISPOSABLE) IMPLANT
TOWEL OR NON WOVEN STRL DISP B (DISPOSABLE) ×2 IMPLANT
WATER STERILE IRR 1000ML POUR (IV SOLUTION) IMPLANT
YANKAUER SUCT BULB TIP 10FT TU (MISCELLANEOUS) ×2 IMPLANT

## 2021-11-07 NOTE — Anesthesia Procedure Notes (Signed)
Procedure Name: LMA Insertion ?Date/Time: 11/07/2021 7:42 AM ?Performed by: Jonna Munro, CRNA ?Pre-anesthesia Checklist: Patient identified, Emergency Drugs available, Suction available, Patient being monitored and Timeout performed ?Patient Re-evaluated:Patient Re-evaluated prior to induction ?Oxygen Delivery Method: Circle system utilized ?Preoxygenation: Pre-oxygenation with 100% oxygen ?Induction Type: IV induction ?LMA: LMA with gastric port inserted ?LMA Size: 5.0 ?Number of attempts: 1 ?Placement Confirmation: positive ETCO2 and breath sounds checked- equal and bilateral ?Tube secured with: Tape ?Dental Injury: Teeth and Oropharynx as per pre-operative assessment  ? ? ? ? ?

## 2021-11-07 NOTE — Transfer of Care (Signed)
Immediate Anesthesia Transfer of Care Note ? ?Patient: William [name] ? ?Procedure(s) Performed: CIRCUMCISION ADULT ? ?Patient Location: PACU ? ?Anesthesia Type:General ? ?Level of Consciousness: awake, alert , oriented and patient cooperative ? ?Airway & Oxygen Therapy: Patient Spontanous Breathing and Patient connected to face mask oxygen ? ?Post-op Assessment: Report given to RN, Post -op Vital signs reviewed and stable and Patient moving all extremities X 4 ? ?Post vital signs: Reviewed and stable ? ?Last Vitals:  ?Vitals Value Taken Time  ?BP 139/66 11/07/21 0846  ?Temp    ?Pulse 71 11/07/21 0848  ?Resp 11 11/07/21 0848  ?SpO2 100 % 11/07/21 0848  ?Vitals shown include unvalidated device data. ? ?Last Pain:  ?Vitals:  ? 11/07/21 0624  ?TempSrc:   ?PainSc: 0-No pain  ?   ? ?  ? ?Complications: No notable events documented. ?

## 2021-11-07 NOTE — Interval H&P Note (Signed)
History and Physical Interval Note: ? ?11/07/2021 ?7:24 AM ? ?William [name]  has presented today for surgery, with the diagnosis of PENILE FORESKIN LESION  BALANITIS.  The various methods of treatment have been discussed with the patient and family. After consideration of risks, benefits and other options for treatment, the patient has consented to  Procedure(s): ?CIRCUMCISION ADULT (N/A) as a surgical intervention.  The patient's history has been reviewed, patient examined, no change in status, stable for surgery.  I have reviewed the patient's chart and labs.  Questions were answered to the patient's satisfaction.   ? ? ?[place] ? ? ?

## 2021-11-07 NOTE — Op Note (Signed)
Pre-operative diagnosis: ?Penile mass on foreskin ?Phimosis ?Balanitis ? ?Postoperative diagnosis: same ? ?Procedure: Circumcision ? ?Surgeon: Donald Pore. M.D. ? ?Anesthesia: General ? ?Complications: None ? ?EBL: Minimal ? ?Specimens: Foreskin ? ?Indication: William [name] is a 71 y.o. male who was found to have a suspicious appearing cutaneous mass on his foreskin.  He elected to proceed with circumcision for treatment. The potential risks, complications, alternative options, and expected recovery process was discussed in detail and informed consent was obtained. ? ?Description of procedure: The patient was taken to the operating room and a general anesthetic was administered. He was given preoperative antibiotics, placed in the supine position, and his genitalia was prepped and draped in the usual sterile fashion. Next, a preoperative timeout was performed. ? ?A dorsal penile block was performed with quarter percent bupivacaine and 1% lidocaine without epinephrine.The penis was examined and appropriate incision sites were marked circumferentially proximal and distally. The mass was identified on the foreskin, there was no evidence of invasion into the tunica or corpora. Additionally, there was some generalized erythema consistent with balanitis, and a tight phimotic ring. These incision sites were then incised with the knife circumferentially allowing the skin to be separated distally and approximately and isolating the foreskin. Hemostats were then placed in the dorsal aspect of the foreskin and Metzenbaum scissors were used to create an opening connecting the proximal and distal incision sites. A hemostat was then placed over the dorsal aspect of the foreskin for hemostatic purposes. This area was then divided with Metzenbaum scissors. The remainder of the foreskin was then excised utilizing sharp and cautery dissection as necessary. Once the foreskin was removed, the underlying penile tissue and fascia was  examined and hemostasis was achieved with cautery as necessary. The proximal and distal incision sites were then reapproximated utilizing 3-0 chromic suture. 4 interrupted sutures were placed dorsally, ventrally, and on either side. The intervening tissue between each quadrant was then reapproximated with running 3-0 chromic suture. ? ?Vaseline gauze, gauze and coban were used as a dressing.  ? ?The patient tolerated the procedure well and without complications. He was able to be awakened and transferred to the recovery unit in satisfactory condition. ? ?Donald Pore MD ?11/07/2021, 8:41 AM  ?[facility]  ?Pager: [phone] ? ? ? ?

## 2021-11-07 NOTE — Anesthesia Postprocedure Evaluation (Signed)
Anesthesia Post Note ? ?Patient: Renaud Celli ? ?Procedure(s) Performed: CIRCUMCISION ADULT ? ?  ? ?Patient location during evaluation: PACU ?Anesthesia Type: General ?Level of consciousness: awake and alert ?Pain management: pain level controlled ?Vital Signs Assessment: post-procedure vital signs reviewed and stable ?Respiratory status: spontaneous breathing, nonlabored ventilation and respiratory function stable ?Cardiovascular status: blood pressure returned to baseline and stable ?Postop Assessment: no apparent nausea or vomiting ?Anesthetic complications: no ? ? ?No notable events documented. ? ?Last Vitals:  ?Vitals:  ? 11/07/21 0900 11/07/21 0915  ?BP: 134/73 139/69  ?Pulse: 70 67  ?Resp: 17 (!) 0  ?Temp:    ?SpO2: 96% 92%  ?  ?Last Pain:  ?Vitals:  ? 11/07/21 0915  ?TempSrc:   ?PainSc: 0-No pain  ? ? ?  ?  ?  ?  ?  ?  ? ?Lidia Collum ? ? ? ? ?

## 2021-11-07 NOTE — Discharge Instructions (Addendum)
Postoperative instructions for circumcision ? ?Begin taking coumadin tomorrow evening (11/08/2021) ?Wound: ? ?Remove the dressing the morning after surgery. ?In most cases your incision will have absorbable sutures that run along the course of your incision and will dissolve within the first 10-20 days. Some will fall out even earlier. Expect some redness as the sutures dissolved but this should occur only around the sutures. If there is generalized redness, especially with increasing pain or swelling, let us know. The penis will possibly get "black and blue" as the blood in the tissues spread. Sometimes the whole penis will turn colors. The black and blue is followed by a yellow and brown color. In time, all the discoloration will go away. ? ?Diet: ? ?You may return to your normal diet within 24 hours following your surgery. You may note some mild nausea and possibly vomiting the first 6-8 hours following surgery. This is usually due to the side effects of anesthesia, and will disappear quite soon. I would suggest clear liquids and a very light meal the first evening following your surgery. ? ?Activity: ? ?Your physical activity should be restricted the first 48 hours. During that time you should remain relatively inactive, moving about only when necessary. During the first 2 weeks following surgery he should avoid lifting any heavy objects (anything greater than 15 pounds), and avoid strenuous exercise. If you work, ask [place] specifically about your restrictions, both for work and home. We will write a note to your employer if needed.  ? ?Do not "use" your penis for 3 weeks or until the incisions are completely healed, whichever comes later. ? ?Ice packs can be placed on and off over the penis for the first 48 hours to help relieve the pain and keep the swelling down. Frozen peas or corn in a ZipLock bag can be frozen, used and re-frozen. Fifteen minutes on and 15 minutes off is a reasonable schedule.  ?No sexual  activity for 1 month. ? ?Hygiene: ? ?You may shower 24 hours after your surgery. Make sure wound is clean and dry afterwards. Tub bathing should be restricted until the seventh day. ? ?Medication: ? ?You will be sent home with some type of pain medication. In many cases you will be sent home with a narcotic pain pill (Vicodin or Tylox). If the pain is not too bad, you may take either Tylenol (acetaminophen) or Advil (ibuprofen) which contain no narcotic agents, and might be tolerated a little better, with fewer side effects. If the pain medication you are sent home with does not control the pain, you will have to let us know. Some narcotic pain medications cannot be given or refilled by a phone call to a pharmacy. ? ?Problems you should report to [place]: ? ?Fever of 101.0 degrees Fahrenheit or greater. ?Moderate or severe swelling under the skin incision or involving the scrotum. ?Drug reaction such as hives, a rash, nausea or vomiting.  ?Difficulty voiding ? ?

## 2021-11-08 ENCOUNTER — Ambulatory Visit (INDEPENDENT_AMBULATORY_CARE_PROVIDER_SITE_OTHER): Payer: Medicare Other | Admitting: Ophthalmology

## 2021-11-08 ENCOUNTER — Encounter (HOSPITAL_COMMUNITY): Payer: Self-pay | Admitting: Urology

## 2021-11-08 DIAGNOSIS — I10 Essential (primary) hypertension: Secondary | ICD-10-CM

## 2021-11-08 DIAGNOSIS — Z961 Presence of intraocular lens: Secondary | ICD-10-CM

## 2021-11-08 DIAGNOSIS — H35033 Hypertensive retinopathy, bilateral: Secondary | ICD-10-CM | POA: Diagnosis not present

## 2021-11-08 DIAGNOSIS — E113313 Type 2 diabetes mellitus with moderate nonproliferative diabetic retinopathy with macular edema, bilateral: Secondary | ICD-10-CM

## 2021-11-08 LAB — SURGICAL PATHOLOGY

## 2021-11-08 MED ORDER — AFLIBERCEPT 2MG/0.05ML IZ SOLN FOR KALEIDOSCOPE
2.0000 mg | INTRAVITREAL | Status: AC | PRN
  Administered 2021-11-08: 2 mg via INTRAVITREAL

## 2021-11-13 ENCOUNTER — Ambulatory Visit (INDEPENDENT_AMBULATORY_CARE_PROVIDER_SITE_OTHER): Payer: Medicare Other

## 2021-11-13 DIAGNOSIS — I480 Paroxysmal atrial fibrillation: Secondary | ICD-10-CM

## 2021-11-13 DIAGNOSIS — Z5181 Encounter for therapeutic drug level monitoring: Secondary | ICD-10-CM | POA: Diagnosis not present

## 2021-11-13 LAB — POCT INR: INR: 1.3 — AB (ref 2.0–3.0)

## 2021-11-13 NOTE — Patient Instructions (Signed)
TAKE 2 TABLETS TODAY ONLY and then Continue taking 1.5 tablets daily except for 1 tablet every Monday, Wednesday and Friday.  Recheck INR in 3 weeks. ?

## 2021-11-20 ENCOUNTER — Other Ambulatory Visit: Payer: Self-pay | Admitting: Internal Medicine

## 2021-11-20 DIAGNOSIS — Z794 Long term (current) use of insulin: Secondary | ICD-10-CM

## 2021-11-20 DIAGNOSIS — N1832 Chronic kidney disease, stage 3b: Secondary | ICD-10-CM | POA: Diagnosis not present

## 2021-11-21 ENCOUNTER — Other Ambulatory Visit: Payer: Self-pay | Admitting: Internal Medicine

## 2021-11-21 DIAGNOSIS — I2510 Atherosclerotic heart disease of native coronary artery without angina pectoris: Secondary | ICD-10-CM

## 2021-11-21 DIAGNOSIS — I5022 Chronic systolic (congestive) heart failure: Secondary | ICD-10-CM

## 2021-11-21 DIAGNOSIS — I10 Essential (primary) hypertension: Secondary | ICD-10-CM

## 2021-11-28 ENCOUNTER — Telehealth: Payer: Self-pay | Admitting: Internal Medicine

## 2021-11-28 ENCOUNTER — Telehealth: Payer: Medicare Other

## 2021-11-28 DIAGNOSIS — N1832 Chronic kidney disease, stage 3b: Secondary | ICD-10-CM | POA: Diagnosis not present

## 2021-11-28 DIAGNOSIS — I129 Hypertensive chronic kidney disease with stage 1 through stage 4 chronic kidney disease, or unspecified chronic kidney disease: Secondary | ICD-10-CM | POA: Diagnosis not present

## 2021-11-28 DIAGNOSIS — E119 Type 2 diabetes mellitus without complications: Secondary | ICD-10-CM

## 2021-11-28 DIAGNOSIS — E1122 Type 2 diabetes mellitus with diabetic chronic kidney disease: Secondary | ICD-10-CM | POA: Diagnosis not present

## 2021-11-28 DIAGNOSIS — D631 Anemia in chronic kidney disease: Secondary | ICD-10-CM | POA: Diagnosis not present

## 2021-11-28 DIAGNOSIS — E875 Hyperkalemia: Secondary | ICD-10-CM | POA: Diagnosis not present

## 2021-11-28 MED ORDER — INSULIN PEN NEEDLE 32G X 4 MM MISC: 1 refills | Status: DC

## 2021-11-28 NOTE — Telephone Encounter (Signed)
Rx sent 

## 2021-11-28 NOTE — Addendum Note (Signed)
Addended by: Hinda Kehr on: 11/28/2021 01:10 PM   Modules accepted: Orders

## 2021-11-28 NOTE — Telephone Encounter (Signed)
Pt 's daughter claims the [facility] pharmacy needs an RX for Insulin Pen needles. Pt will run out of needles tomorrow, please send RX ASAP to [facility] [address] in [place]: [phone]

## 2021-11-29 ENCOUNTER — Other Ambulatory Visit: Payer: Self-pay | Admitting: Internal Medicine

## 2021-11-29 ENCOUNTER — Other Ambulatory Visit: Payer: Self-pay | Admitting: Cardiology

## 2021-11-29 DIAGNOSIS — G2581 Restless legs syndrome: Secondary | ICD-10-CM

## 2021-11-30 NOTE — Progress Notes (Signed)
[facility] Note  12/06/2021     CHIEF COMPLAINT Patient presents for Retina Follow Up  HISTORY OF PRESENT ILLNESS: William [name] is a 71 y.o. male who presents to the clinic today for:   HPI     Retina Follow Up   Patient presents with  Diabetic Retinopathy.  In both eyes.  This started 4 weeks ago.  I, the attending physician,  performed the HPI with the patient and updated documentation appropriately.        Comments   Patient here for 4 weeks retina follow up for NPDR OU. Patient states vision doing good. Doing ok. No eye pain.       Last edited by [name], MD on 12/06/2021 12:35 PM.     Referring physician: [name], MD [place],  [place] [address]  HISTORICAL INFORMATION:  Selected notes from the MEDICAL RECORD NUMBER Referred by Dr. [name] for eval of DME OU LEE:  Ocular Hx- PMH-    CURRENT MEDICATIONS: No current outpatient medications on file. (Ophthalmic Drugs)   No current facility-administered medications for this visit. (Ophthalmic Drugs)   Current Outpatient Medications (Other)  Medication Sig   albuterol (PROVENTIL HFA;VENTOLIN HFA) 108 (90 Base) MCG/ACT inhaler Inhale 2 puffs into the lungs every 6 (six) hours as needed for wheezing or shortness of breath.   aspirin EC 81 MG tablet Take 1 tablet (81 mg total) by mouth daily.   carvedilol (COREG) 3.125 MG tablet TAKE 1 TABLET(3.125 MG) BY MOUTH TWICE DAILY WITH A MEAL   celecoxib (CELEBREX) 100 MG capsule Take 1 capsule (100 mg total) by mouth 2 (two) times daily as needed for mild pain or moderate pain.   Continuous Blood Gluc Receiver (FREESTYLE LIBRE 2 READER) DEVI 1 Act by Does not apply route daily.   Continuous Blood Gluc Sensor (FREESTYLE LIBRE 2 SENSOR) MISC 1 Act by Does not apply route daily.   FARXIGA 10 MG TABS tablet TAKE 1 TABLET(10 MG) BY MOUTH DAILY BEFORE BREAKFAST   gabapentin (NEURONTIN) 300 MG capsule TAKE 1 CAPSULE(300 MG) BY  MOUTH TWICE DAILY   HUMALOG KWIKPEN 100 UNIT/ML KwikPen INJECT 10 UNITS UNDER THE SKIN THREE TIMES DAILY AS DIRECTED BY SLIDING SCALE (Patient taking differently: Inject 10-45 Units into the skin 3 (three) times daily. Pt takes with meals)   insulin glargine (LANTUS SOLOSTAR) 100 UNIT/ML Solostar Pen INJECT 55 UNITS UNDER THE SKIN DAILY (Patient taking differently: INJECT 55 UNITS UNDER THE SKIN DAILY PT TAKES IN THE PM)   Insulin Pen Needle 31G X 8 MM MISC 1 each by Does not apply route 4 (four) times daily.   Insulin Pen Needle 32G X 4 MM MISC Use daily to administer insulin.   oxyCODONE-acetaminophen (PERCOCET) 5-325 MG tablet Take 1 tablet by mouth every 6 (six) hours as needed for severe pain.   pantoprazole (PROTONIX) 40 MG tablet TAKE 1 TABLET(40 MG) BY MOUTH TWICE DAILY BEFORE A MEAL   pramipexole (MIRAPEX) 0.125 MG tablet TAKE 1 TABLET(0.125 MG) BY MOUTH AT BEDTIME   rosuvastatin (CRESTOR) 40 MG tablet TAKE 1 TABLET BY MOUTH EVERY DAY   torsemide (DEMADEX) 20 MG tablet TAKE 1 TABLET(20 MG) BY MOUTH 3 TIMES A WEEK (Patient taking differently: TAKE 1 TABLET(20 MG) BY MOUTH 3 TIMES A WEEK Monday, Wednesday and Friday)   warfarin (COUMADIN) 5 MG tablet TAKE 1 TO 2 TABLETS BY MOUTH DAILY AS DIRECTED BY COUMADIN CLINIC   Glucagon (  GVOKE HYPOPEN 2-PACK) 1 MG/0.2ML SOAJ Inject 1 Act into the skin daily as needed. (Patient not taking: Reported on 12/06/2021)   No current facility-administered medications for this visit. (Other)   REVIEW OF SYSTEMS: ROS   Positive for: Gastrointestinal, Genitourinary, Endocrine, Eyes Negative for: Constitutional, Neurological, Skin, Musculoskeletal, HENT, Cardiovascular, Respiratory, Psychiatric, Allergic/Imm, Heme/Lymph Last edited by [name], COA on 12/06/2021  9:31 AM.     ALLERGIES Allergies  Allergen Reactions   Morphine And Related Shortness Of Breath and Other (See Comments)    UNSPECIFIED REACTION "Pt said it was too much"    Latex Rash    PAST MEDICAL HISTORY Past Medical History:  Diagnosis Date   AKI (acute kidney injury) ([place])    With STEMI in 8144   Chronic systolic CHF (congestive heart failure) ([place])    Depression    Diabetes mellitus without complication ([name])    Diabetic retinopathy ([place])    GERD (gastroesophageal reflux disease)    Hx of adenomatous colonic polyps 04/07/2018   Hyperlipidemia    Hypertension    Hypertensive retinopathy    Paroxysmal atrial fibrillation ([name])    Peripheral vascular disease ([place])    Pneumonia    Seizures ([name])    hx of as a child   STEMI (ST elevation myocardial infarction) ([place]) 2017   Past Surgical History:  Procedure Laterality Date   ABDOMINAL AORTOGRAM W/LOWER EXTREMITY N/A 09/19/2016   Procedure: Abdominal Aortogram w/Lower Extremity;  Surgeon: Wellington Hampshire, MD;  Location: [facility];  Service: Cardiovascular;  Laterality: N/A;   AMPUTATION Right 03/23/2016   Procedure: 1st and 2nd Ray Amputation Right Foot;  Surgeon: Newt Minion, MD;  Location: [facility];  Service: Orthopedics;  Laterality: Right;   AMPUTATION Right 06/21/2016   Procedure: RIGHT TRANSMETATARSAL AMPUTATION;  Surgeon: Newt Minion, MD;  Location: [facility];  Service: Orthopedics;  Laterality: Right;   CARDIAC CATHETERIZATION N/A 03/13/2016   Procedure: Right/Left Heart Cath and Coronary Angiography;  Surgeon: Sherren Mocha, MD;  Location: [facility];  Service: Cardiovascular;  Laterality: N/A;   CARDIAC CATHETERIZATION N/A 03/13/2016   Procedure: IABP Insertion;  Surgeon: Sherren Mocha, MD;  Location: [facility];  Service: Cardiovascular;  Laterality: N/A;   CATARACT EXTRACTION     CATARACT EXTRACTION, BILATERAL     CERVICAL FUSION  1982, 1992   has had 3 neck surgeries from breaking his neck   CIRCUMCISION N/A 11/07/2021   Procedure: CIRCUMCISION ADULT;  Surgeon: Vira Agar, MD;  Location: [facility];  Service: Urology;  Laterality: N/A;   CORONARY ARTERY BYPASS GRAFT N/A  03/13/2016   Procedure: CORONARY ARTERY BYPASS GRAFTING (CABG) x 1 (SVG to OM) with EVH from [place];  Surgeon: Ivin Poot, MD;  Location: [facility];  Service: Open Heart Surgery;  Laterality: N/A;   EYE SURGERY     LOWER EXTREMITY ANGIOGRAM  05/02/2016   Procedure: Lower Extremity Angiogram;  Surgeon: Wellington Hampshire, MD;  Location: [facility];  Service: Cardiovascular;;  Limited left femoral runoff right femoral runoff   PERIPHERAL VASCULAR CATHETERIZATION N/A 05/02/2016   Procedure: Abdominal Aortogram;  Surgeon: Wellington Hampshire, MD;  Location: [facility];  Service: Cardiovascular;  Laterality: N/A;   PERIPHERAL VASCULAR CATHETERIZATION Right 05/02/2016   Procedure: Peripheral Vascular Balloon Angioplasty;  Surgeon: Wellington Hampshire, MD;  Location: [facility];  Service: Cardiovascular;  Laterality: Right;  SFA   TEE  WITHOUT CARDIOVERSION N/A 03/13/2016   Procedure: TRANSESOPHAGEAL ECHOCARDIOGRAM (TEE);  Surgeon: Ivin Poot, MD;  Location: [facility];  Service: Open Heart Surgery;  Laterality: N/A;   VSD REPAIR N/A 03/13/2016   Procedure: VENTRICULAR SEPTAL DEFECT (VSD) REPAIR;  Surgeon: Ivin Poot, MD;  Location: [facility];  Service: Open Heart Surgery;  Laterality: N/A;   FAMILY HISTORY Family History  Problem Relation Age of Onset   Diabetes Maternal Grandmother    Diabetes Mother    Aneurysm Mother    Peripheral Artery Disease Mother    Coronary artery disease Mother    Peptic Ulcer Father    Retinoblastoma Daughter    Colon cancer Neg Hx    Rectal cancer Neg Hx    SOCIAL HISTORY Social History   Tobacco Use   Smoking status: Former    Packs/day: 0.50    Types: Cigarettes    Quit date: 11/20/2015    Years since quitting: 6.0   Smokeless tobacco: Never   Tobacco comments:    quit 2018  Vaping Use   Vaping Use: Never used  Substance Use Topics   Alcohol use: No   Drug use: No       OPHTHALMIC EXAM: Base Eye Exam      Visual Acuity (Snellen - Linear)       Right Left   Dist [place] 20/25 20/20 -2   Dist ph [place] 20/20          Tonometry (Tonopen, 9:29 AM)       Right Left   Pressure 13 12         Pupils       Dark Light Shape React APD   Right 2 1 Round Minimal None   Left 2 1 Round Minimal None         Visual Fields (Counting fingers)       Left Right    Full Full         Extraocular Movement       Right Left    Full, Ortho Full, Ortho         Neuro/Psych     Oriented x3: Yes   Mood/Affect: Normal         Dilation     Both eyes: 1.0% Mydriacyl, 2.5% Phenylephrine @ 9:29 AM           Slit Lamp and Fundus Exam     Slit Lamp Exam       Right Left   Lids/Lashes Dermatochalasis - upper lid, mild MGD Dermatochalasis - upper lid, mild MGD   Conjunctiva/Sclera White and quiet White and quiet   Cornea trace PEE trace PEE   Anterior Chamber Deep and quiet Deep and quiet   Iris Round and dilated, No NVI Round and dilated, No NVI   Lens PC IOL in good position PC IOL in good position   Anterior Vitreous Vitreous syneresis, vitreous condensations Vitreous syneresis, Posterior vitreous detachment, vitreous condensations         Fundus Exam       Right Left   Disc Pink and Sharp mild Pallor, Sharp rim, mild tilt   C/D Ratio 0.3 0.3   Macula Flat, Blunted foveal reflex, scatted Microaneurysms/DBH - improved, scattered Cystic changes/edema greatest superior mac -- slightly improved Blunted foveal reflex, central IRF/edema - slightly improved, +MA/DBH -- improved   Vessels attenuated, Tortuous attenuated, Tortuous, mild Copper wiring   Periphery Attached, +MA greatest posteriorly Attached, scattered MA greatest posteriorly  IMAGING AND PROCEDURES  Imaging and Procedures for 12/06/2021  OCT, Retina - OU - Both Eyes       Right Eye Quality was good. Central Foveal Thickness: 336. Progression has improved. Findings include abnormal foveal contour,  intraretinal fluid, no SRF, intraretinal hyper-reflective material (interval improvement in IRF/IRHM and foveal contour).   Left Eye Quality was good. Central Foveal Thickness: 273. Progression has improved. Findings include abnormal foveal contour, intraretinal fluid, intraretinal hyper-reflective material, no SRF (Mild interval improvement in IRF/IRHM and foveal contour).   Notes *Images captured and stored on drive  Diagnosis / Impression:  +DME OU OD: interval improvement in IRF/IRHM and foveal contour OS: Mild interval improvement in IRF/IRHM and foveal contour  Clinical management:  See below  Abbreviations: NFP - Normal foveal profile. CME - cystoid macular edema. PED - pigment epithelial detachment. IRF - intraretinal fluid. SRF - subretinal fluid. EZ - ellipsoid zone. ERM - epiretinal membrane. ORA - outer retinal atrophy. ORT - outer retinal tubulation. SRHM - subretinal hyper-reflective material. IRHM - intraretinal hyper-reflective material      Intravitreal Injection, Pharmacologic Agent - OD - Right Eye       Time Out 12/06/2021. 10:18 AM. Confirmed correct patient, procedure, site, and patient consented.   Anesthesia Topical anesthesia was used. Anesthetic medications included Lidocaine 2%, Proparacaine 0.5%.   Procedure Preparation included 5% betadine to ocular surface, eyelid speculum. A (32g) needle was used.   Injection: 2 mg aflibercept 2 MG/0.05ML   Route: Intravitreal, Site: Right Eye   NDC: A3590391, Lot: [id number], Expiration date: 09/30/2022, Waste: 0 mL   Post-op Post injection exam found visual acuity of at least counting fingers. The patient tolerated the procedure well. There were no complications. The patient received written and verbal post procedure care education. Post injection medications were not given.      Intravitreal Injection, Pharmacologic Agent - OS - Left Eye       Time Out 12/06/2021. 10:18 AM. Confirmed correct patient,  procedure, site, and patient consented.   Anesthesia Topical anesthesia was used. Anesthetic medications included Lidocaine 2%, Proparacaine 0.5%.   Procedure Preparation included 5% betadine to ocular surface, eyelid speculum. A (32g) needle was used.   Injection: 2 mg aflibercept 2 MG/0.05ML   Route: Intravitreal, Site: Left Eye   NDC: A3590391, Lot: [id number], Expiration date: 08/29/2022, Waste: 0 mL   Post-op Post injection exam found visual acuity of at least counting fingers. The patient tolerated the procedure well. There were no complications. The patient received written and verbal post procedure care education. Post injection medications were not given.            ASSESSMENT/PLAN:    ICD-10-CM   1. Moderate nonproliferative diabetic retinopathy of both eyes with macular edema associated with type 2 diabetes mellitus ([name])  E11.3313 OCT, Retina - OU - Both Eyes    Intravitreal Injection, Pharmacologic Agent - OD - Right Eye    Intravitreal Injection, Pharmacologic Agent - OS - Left Eye    aflibercept (EYLEA) SOLN 2 mg    aflibercept (EYLEA) SOLN 2 mg    2. Essential hypertension  I10     3. Hypertensive retinopathy of both eyes  H35.033     4. Pseudophakia, both eyes  Z96.1      1. Moderate Non-proliferative diabetic retinopathy, both eyes  - s/p IVA OD #1 (11.14.22), #2 (12.12.22), #3 (01.09.23) -- IVA resistance - s/p IVA OS #1 (10.17.22), #2 (11.14.22), #3 (12.12.22), #4 (01.09.23), #5 (03.15.23), #6 (  04.12.23) -- IVA resistance - s/p IVE OD #1 (03.15.23), #2 (04.12.23), #3 (05.10.23) - s/p IVE OS #1 (05.10.23) - exam shows scattered MA, DBH OU - FA (10.17.22) shows late leaking MA OU, no NV OU - OCT shows interval improvement in IRF/IRHM and foveal contour OU - BCVA 20/20 OU! - recommend IVE OD #4 and IVE OS #2 today, 06.07.23 for DME  - pt wishes to proceed  - RBA of procedure discussed, questions answered - IVE informed consent obtained and signed,  03.15.23 (OD) - IVE informed consent obtained and signed, 05.10.23 (OS) - see procedure note - pt has vacation planned on June 25 - will plan for q4w schedule until then - f/u in 4 wks -- DFE/OCT, possible injection(s)  2,3. Hypertensive retinopathy OU - discussed importance of tight BP control - monitor  4. Pseudophakia OU  - s/p CE/IOL  - IOL in good position, doing well  - monitor  Ophthalmic Meds Ordered this visit:  Meds ordered this encounter  Medications   aflibercept (EYLEA) SOLN 2 mg   aflibercept (EYLEA) SOLN 2 mg     Return in about 4 weeks (around 01/03/2022) for f/u NPDR OU, DFE, OCT.  There are no Patient Instructions on file for this visit.  Explained the diagnoses, plan, and follow up with the patient and they expressed understanding.  Patient expressed understanding of the importance of proper follow up care.   This document serves as a record of services personally performed by Gardiner Sleeper, MD, PhD. It was created on their behalf by Orvan Falconer, an ophthalmic technician. The creation of this record is the provider's dictation and/or activities during the visit.    Electronically signed by: Orvan Falconer, OA, 12/06/21  12:39 PM  This document serves as a record of services personally performed by Gardiner Sleeper, MD, PhD. It was created on their behalf by San Jetty. Owens Shark, OA an ophthalmic technician. The creation of this record is the provider's dictation and/or activities during the visit.    Electronically signed by: San Jetty. Owens Shark, OA 12:39 PM  Gardiner Sleeper, M.D., Ph.D. Diseases & Surgery of the Retina and Vitreous [facility]  I have reviewed the above documentation for accuracy and completeness, and I agree with the above. Gardiner Sleeper, M.D., Ph.D. 12/06/21 12:39 PM  Abbreviations: M myopia (nearsighted); A astigmatism; H hyperopia (farsighted); P presbyopia; Mrx spectacle prescription;  CTL contact lenses; OD  right eye; OS left eye; OU both eyes  XT exotropia; ET esotropia; PEK punctate epithelial keratitis; PEE punctate epithelial erosions; DES dry eye syndrome; MGD meibomian gland dysfunction; ATs artificial tears; PFAT's preservative free artificial tears; [place] nuclear sclerotic cataract; PSC posterior subcapsular cataract; ERM epi-retinal membrane; PVD posterior vitreous detachment; RD retinal detachment; DM diabetes mellitus; DR diabetic retinopathy; NPDR non-proliferative diabetic retinopathy; PDR proliferative diabetic retinopathy; CSME clinically significant macular edema; DME diabetic macular edema; dbh dot blot hemorrhages; CWS cotton wool spot; POAG primary open angle glaucoma; C/D cup-to-disc ratio; HVF humphrey visual field; GVF goldmann visual field; OCT optical coherence tomography; IOP intraocular pressure; BRVO Branch retinal vein occlusion; CRVO central retinal vein occlusion; CRAO central retinal artery occlusion; BRAO branch retinal artery occlusion; RT retinal tear; SB scleral buckle; PPV pars plana vitrectomy; VH Vitreous hemorrhage; PRP panretinal laser photocoagulation; IVK intravitreal kenalog; VMT vitreomacular traction; MH Macular hole;  NVD neovascularization of the disc; NVE neovascularization elsewhere; AREDS age related eye disease study; ARMD age related macular degeneration; POAG primary  open angle glaucoma; EBMD epithelial/anterior basement membrane dystrophy; ACIOL anterior chamber intraocular lens; IOL intraocular lens; PCIOL posterior chamber intraocular lens; Phaco/IOL phacoemulsification with intraocular lens placement; PRK photorefractive keratectomy; LASIK laser assisted in situ keratomileusis; HTN hypertension; DM diabetes mellitus; COPD chronic obstructive pulmonary disease  

## 2021-12-04 ENCOUNTER — Encounter (INDEPENDENT_AMBULATORY_CARE_PROVIDER_SITE_OTHER): Payer: Medicare Other | Admitting: Ophthalmology

## 2021-12-04 DIAGNOSIS — N481 Balanitis: Secondary | ICD-10-CM | POA: Diagnosis not present

## 2021-12-06 ENCOUNTER — Ambulatory Visit (INDEPENDENT_AMBULATORY_CARE_PROVIDER_SITE_OTHER): Payer: Medicare Other | Admitting: Ophthalmology

## 2021-12-06 ENCOUNTER — Encounter (INDEPENDENT_AMBULATORY_CARE_PROVIDER_SITE_OTHER): Payer: Self-pay | Admitting: Ophthalmology

## 2021-12-06 DIAGNOSIS — H35033 Hypertensive retinopathy, bilateral: Secondary | ICD-10-CM | POA: Diagnosis not present

## 2021-12-06 DIAGNOSIS — Z961 Presence of intraocular lens: Secondary | ICD-10-CM | POA: Diagnosis not present

## 2021-12-06 DIAGNOSIS — I10 Essential (primary) hypertension: Secondary | ICD-10-CM | POA: Diagnosis not present

## 2021-12-06 DIAGNOSIS — E113313 Type 2 diabetes mellitus with moderate nonproliferative diabetic retinopathy with macular edema, bilateral: Secondary | ICD-10-CM

## 2021-12-06 MED ORDER — AFLIBERCEPT 2MG/0.05ML IZ SOLN FOR KALEIDOSCOPE
2.0000 mg | INTRAVITREAL | Status: AC | PRN
  Administered 2021-12-06: 2 mg via INTRAVITREAL

## 2021-12-09 ENCOUNTER — Other Ambulatory Visit: Payer: Self-pay | Admitting: Internal Medicine

## 2021-12-09 DIAGNOSIS — I5022 Chronic systolic (congestive) heart failure: Secondary | ICD-10-CM

## 2021-12-09 DIAGNOSIS — E1122 Type 2 diabetes mellitus with diabetic chronic kidney disease: Secondary | ICD-10-CM

## 2021-12-09 DIAGNOSIS — N1832 Chronic kidney disease, stage 3b: Secondary | ICD-10-CM

## 2021-12-09 DIAGNOSIS — Z794 Long term (current) use of insulin: Secondary | ICD-10-CM

## 2021-12-11 ENCOUNTER — Ambulatory Visit (INDEPENDENT_AMBULATORY_CARE_PROVIDER_SITE_OTHER): Payer: Medicare Other | Admitting: *Deleted

## 2021-12-11 DIAGNOSIS — I480 Paroxysmal atrial fibrillation: Secondary | ICD-10-CM

## 2021-12-11 LAB — POCT INR: INR: 1.9 — AB (ref 2.0–3.0)

## 2021-12-11 NOTE — Patient Instructions (Signed)
Description   TAKE 1.5 TABLETS TODAY ONLY and then Continue taking 1.5 tablets daily except for 1 tablet every Monday, Wednesday and Friday. Recheck INR in 3 weeks.

## 2021-12-26 NOTE — Progress Notes (Signed)
[facility] Note  01/03/2022     CHIEF COMPLAINT Patient presents for Retina Follow Up  HISTORY OF PRESENT ILLNESS: William [name] is a 71 y.o. male who presents to the clinic today for:   HPI     Retina Follow Up   Patient presents with  Diabetic Retinopathy.  In both eyes.  Severity is moderate.  Duration of 4 weeks.  Since onset it is gradually improving.  I, the attending physician,  performed the HPI with the patient and updated documentation appropriately.        Comments   Pt here for 4 wk ret f/u NPDR OU. Pt states VA is improving. No changes reported.       Last edited by [name], MD on 01/03/2022 12:30 PM.     Referring physician: [name], MD [place],  [place] [address]  HISTORICAL INFORMATION:  Selected notes from the MEDICAL RECORD NUMBER Referred by Dr. [name] for eval of DME OU LEE:  Ocular Hx- PMH-    CURRENT MEDICATIONS: No current outpatient medications on file. (Ophthalmic Drugs)   No current facility-administered medications for this visit. (Ophthalmic Drugs)   Current Outpatient Medications (Other)  Medication Sig   albuterol (PROVENTIL HFA;VENTOLIN HFA) 108 (90 Base) MCG/ACT inhaler Inhale 2 puffs into the lungs every 6 (six) hours as needed for wheezing or shortness of breath.   aspirin EC 81 MG tablet Take 1 tablet (81 mg total) by mouth daily.   carvedilol (COREG) 3.125 MG tablet TAKE 1 TABLET(3.125 MG) BY MOUTH TWICE DAILY WITH A MEAL   celecoxib (CELEBREX) 100 MG capsule Take 1 capsule (100 mg total) by mouth 2 (two) times daily as needed for mild pain or moderate pain.   Continuous Blood Gluc Receiver (FREESTYLE LIBRE 2 READER) DEVI 1 Act by Does not apply route daily.   Continuous Blood Gluc Sensor (FREESTYLE LIBRE 2 SENSOR) MISC USE TO CHECK BLOOD SUGAR AND CHANGE EVERY 14 DAYS   FARXIGA 10 MG TABS tablet TAKE 1 TABLET(10 MG) BY MOUTH DAILY BEFORE BREAKFAST   gabapentin (NEURONTIN)  300 MG capsule TAKE 1 CAPSULE(300 MG) BY MOUTH TWICE DAILY   Glucagon (GVOKE HYPOPEN 2-PACK) 1 MG/0.2ML SOAJ Inject 1 Act into the skin daily as needed.   HUMALOG KWIKPEN 100 UNIT/ML KwikPen INJECT 10 UNITS UNDER THE SKIN THREE TIMES DAILY AS DIRECTED BY SLIDING SCALE (Patient taking differently: Inject 10-45 Units into the skin 3 (three) times daily. Pt takes with meals)   insulin glargine (LANTUS SOLOSTAR) 100 UNIT/ML Solostar Pen INJECT 55 UNITS UNDER THE SKIN DAILY (Patient taking differently: INJECT 55 UNITS UNDER THE SKIN DAILY PT TAKES IN THE PM)   Insulin Pen Needle 31G X 8 MM MISC 1 each by Does not apply route 4 (four) times daily.   oxyCODONE-acetaminophen (PERCOCET) 5-325 MG tablet Take 1 tablet by mouth every 6 (six) hours as needed for severe pain.   pantoprazole (PROTONIX) 40 MG tablet TAKE 1 TABLET(40 MG) BY MOUTH TWICE DAILY BEFORE A MEAL   pramipexole (MIRAPEX) 0.125 MG tablet TAKE 1 TABLET(0.125 MG) BY MOUTH AT BEDTIME   rosuvastatin (CRESTOR) 40 MG tablet TAKE 1 TABLET BY MOUTH EVERY DAY   torsemide (DEMADEX) 20 MG tablet TAKE 1 TABLET(20 MG) BY MOUTH 3 TIMES A WEEK (Patient taking differently: TAKE 1 TABLET(20 MG) BY MOUTH 3 TIMES A WEEK Monday, Wednesday and Friday)   warfarin (COUMADIN) 5 MG tablet TAKE 1 TO 2 TABLETS  BY MOUTH DAILY AS DIRECTED BY COUMADIN CLINIC   No current facility-administered medications for this visit. (Other)   REVIEW OF SYSTEMS: ROS   Positive for: Gastrointestinal, Genitourinary, Endocrine, Eyes Negative for: Constitutional, Neurological, Skin, Musculoskeletal, HENT, Cardiovascular, Respiratory, Psychiatric, Allergic/Imm, Heme/Lymph Last edited by Kingsley Spittle, COT on 01/03/2022  8:35 AM.     ALLERGIES Allergies  Allergen Reactions   Morphine And Related Shortness Of Breath and Other (See Comments)    UNSPECIFIED REACTION "Pt said it was too much"    Latex Rash   PAST MEDICAL HISTORY Past Medical History:  Diagnosis Date   AKI  (acute kidney injury) ([place])    With STEMI in 8315   Chronic systolic CHF (congestive heart failure) ([place])    Depression    Diabetes mellitus without complication ([name])    Diabetic retinopathy ([place])    GERD (gastroesophageal reflux disease)    Hx of adenomatous colonic polyps 04/07/2018   Hyperlipidemia    Hypertension    Hypertensive retinopathy    Paroxysmal atrial fibrillation ([name])    Peripheral vascular disease ([place])    Pneumonia    Seizures ([name])    hx of as a child   STEMI (ST elevation myocardial infarction) ([place]) 2017   Past Surgical History:  Procedure Laterality Date   ABDOMINAL AORTOGRAM W/LOWER EXTREMITY N/A 09/19/2016   Procedure: Abdominal Aortogram w/Lower Extremity;  Surgeon: Wellington Hampshire, MD;  Location: [facility];  Service: Cardiovascular;  Laterality: N/A;   AMPUTATION Right 03/23/2016   Procedure: 1st and 2nd Ray Amputation Right Foot;  Surgeon: Newt Minion, MD;  [place] [facility];  Service: Orthopedics;  Laterality: Right;   AMPUTATION Right 06/21/2016   Procedure: RIGHT TRANSMETATARSAL AMPUTATION;  Surgeon: Newt Minion, MD;  Location: [facility];  Service: Orthopedics;  Laterality: Right;   CARDIAC CATHETERIZATION N/A 03/13/2016   Procedure: Right/Left Heart Cath and Coronary Angiography;  Surgeon: Sherren Mocha, MD;  Location: [facility];  Service: Cardiovascular;  Laterality: N/A;   CARDIAC CATHETERIZATION N/A 03/13/2016   Procedure: IABP Insertion;  Surgeon: Sherren Mocha, MD;  Location: [facility];  Service: Cardiovascular;  Laterality: N/A;   CATARACT EXTRACTION     CATARACT EXTRACTION, BILATERAL     CERVICAL FUSION  1982, 1992   has had 3 neck surgeries from breaking his neck   CIRCUMCISION N/A 11/07/2021   Procedure: CIRCUMCISION ADULT;  Surgeon: Vira Agar, MD;  Location: [facility];  Service: Urology;  Laterality: N/A;   CORONARY ARTERY BYPASS GRAFT N/A 03/13/2016   Procedure: CORONARY ARTERY BYPASS GRAFTING (CABG) x 1  (SVG to OM) with EVH from [place];  Surgeon: Ivin Poot, MD;  Location: [facility];  Service: Open Heart Surgery;  Laterality: N/A;   EYE SURGERY     LOWER EXTREMITY ANGIOGRAM  05/02/2016   Procedure: Lower Extremity Angiogram;  Surgeon: Wellington Hampshire, MD;  Location: [facility];  Service: Cardiovascular;;  Limited left femoral runoff right femoral runoff   PERIPHERAL VASCULAR CATHETERIZATION N/A 05/02/2016   Procedure: Abdominal Aortogram;  Surgeon: Wellington Hampshire, MD;  Location: [facility];  Service: Cardiovascular;  Laterality: N/A;   PERIPHERAL VASCULAR CATHETERIZATION Right 05/02/2016   Procedure: Peripheral Vascular Balloon Angioplasty;  Surgeon: Wellington Hampshire, MD;  Location: [facility];  Service: Cardiovascular;  Laterality: Right;  SFA   TEE WITHOUT CARDIOVERSION N/A 03/13/2016   Procedure: TRANSESOPHAGEAL ECHOCARDIOGRAM (TEE);  Surgeon: Collier Salina  Prescott Gum, MD;  Location: [facility];  Service: Open Heart Surgery;  Laterality: N/A;   VSD REPAIR N/A 03/13/2016   Procedure: VENTRICULAR SEPTAL DEFECT (VSD) REPAIR;  Surgeon: Ivin Poot, MD;  Location: [facility];  Service: Open Heart Surgery;  Laterality: N/A;   FAMILY HISTORY Family History  Problem Relation Age of Onset   Diabetes Maternal Grandmother    Diabetes Mother    Aneurysm Mother    Peripheral Artery Disease Mother    Coronary artery disease Mother    Peptic Ulcer Father    Retinoblastoma Daughter    Colon cancer Neg Hx    Rectal cancer Neg Hx    SOCIAL HISTORY Social History   Tobacco Use   Smoking status: Former    Packs/day: 0.50    Types: Cigarettes    Quit date: 11/20/2015    Years since quitting: 6.1   Smokeless tobacco: Never   Tobacco comments:    quit 2018  Vaping Use   Vaping Use: Never used  Substance Use Topics   Alcohol use: No   Drug use: No       OPHTHALMIC EXAM: Base Eye Exam     Visual Acuity (Snellen - Linear)       Right Left   Dist [place]  20/40 20/30 +1   Dist ph [place] 20/25 20/25 -1         Tonometry (Tonopen, 8:41 AM)       Right Left   Pressure 9 9         Pupils       Dark Light Shape React APD   Right 2 1 Round Brisk None   Left 2 1 Round Brisk None         Visual Fields (Counting fingers)       Left Right    Full Full         Extraocular Movement       Right Left    Full, Ortho Full, Ortho         Neuro/Psych     Oriented x3: Yes   Mood/Affect: Normal         Dilation     Both eyes: 1.0% Mydriacyl, 2.5% Phenylephrine @ 8:42 AM           Slit Lamp and Fundus Exam     Slit Lamp Exam       Right Left   Lids/Lashes Dermatochalasis - upper lid, mild MGD Dermatochalasis - upper lid, mild MGD   Conjunctiva/Sclera White and quiet White and quiet   Cornea trace PEE trace PEE   Anterior Chamber Deep and quiet Deep and quiet   Iris Round and dilated, No NVI Round and dilated, No NVI   Lens PC IOL in good position PC IOL in good position   Anterior Vitreous Vitreous syneresis, vitreous condensations Vitreous syneresis, Posterior vitreous detachment, vitreous condensations         Fundus Exam       Right Left   Disc Pink and Sharp mild Pallor, Sharp rim, mild tilt   C/D Ratio 0.3 0.3   Macula Flat, Blunted foveal reflex, scatted Microaneurysms/DBH - improved, scattered Cystic changes/edema greatest superior mac -- slightly increased Blunted foveal reflex, central IRF/edema - slightly improved, +MA/DBH -- improved   Vessels attenuated, Tortuous attenuated, Tortuous, mild Copper wiring   Periphery Attached, +MA greatest posteriorly Attached, scattered MA greatest posteriorly           IMAGING AND PROCEDURES  Imaging and Procedures for 01/03/2022  POCT INR      Component Value Flag Ref Range Units Status   INR 2.2      2.0 - 3.0  Final           OCT, Retina - OU - Both Eyes       Right Eye Quality was good. Central Foveal Thickness: 353. Progression has worsened.  Findings include no SRF, abnormal foveal contour, intraretinal hyper-reflective material, intraretinal fluid (Mild interval increase in IRF/IRHM ).   Left Eye Quality was good. Central Foveal Thickness: 265. Progression has improved. Findings include no SRF, abnormal foveal contour, intraretinal hyper-reflective material, intraretinal fluid (Mild interval improvement in IRF/IRHM ST mac, persistent cystic changes centrally ).   Notes *Images captured and stored on drive  Diagnosis / Impression:  +DME OU OD: Mild interval increase in IRF/IRHM OS: Mild interval improvement in IRF/IRHM ST mac, persistent cystic changes centrally   Clinical management:  See below  Abbreviations: NFP - Normal foveal profile. CME - cystoid macular edema. PED - pigment epithelial detachment. IRF - intraretinal fluid. SRF - subretinal fluid. EZ - ellipsoid zone. ERM - epiretinal membrane. ORA - outer retinal atrophy. ORT - outer retinal tubulation. SRHM - subretinal hyper-reflective material. IRHM - intraretinal hyper-reflective material      Intravitreal Injection, Pharmacologic Agent - OD - Right Eye       Time Out 01/03/2022. 10:16 AM. Confirmed correct patient, procedure, site, and patient consented.   Anesthesia Topical anesthesia was used. Anesthetic medications included Lidocaine 2%, Proparacaine 0.5%.   Procedure Preparation included 5% betadine to ocular surface, eyelid speculum. A (32g) needle was used.   Injection: 2 mg aflibercept 2 MG/0.05ML   Route: Intravitreal, Site: Right Eye   NDC: A3590391, Lot: [id number], Expiration date: 09/29/2022, Waste: 0 mL   Post-op Post injection exam found visual acuity of at least counting fingers. The patient tolerated the procedure well. There were no complications. The patient received written and verbal post procedure care education. Post injection medications were not given.      Intravitreal Injection, Pharmacologic Agent - OS - Left Eye        Time Out 01/03/2022. 10:17 AM. Confirmed correct patient, procedure, site, and patient consented.   Anesthesia Topical anesthesia was used. Anesthetic medications included Lidocaine 2%, Proparacaine 0.5%.   Procedure Preparation included 5% betadine to ocular surface, eyelid speculum. A (32g) needle was used.   Injection: 2 mg aflibercept 2 MG/0.05ML   Route: Intravitreal, Site: Left Eye   NDC: A3590391, Lot: [id number], Expiration date: 09/30/2022, Waste: 0 mL   Post-op Post injection exam found visual acuity of at least counting fingers. The patient tolerated the procedure well. There were no complications. The patient received written and verbal post procedure care education. Post injection medications were not given.            ASSESSMENT/PLAN:    ICD-10-CM   1. Moderate nonproliferative diabetic retinopathy of both eyes with macular edema associated with type 2 diabetes mellitus ([name])  E11.3313 OCT, Retina - OU - Both Eyes    Intravitreal Injection, Pharmacologic Agent - OD - Right Eye    Intravitreal Injection, Pharmacologic Agent - OS - Left Eye    aflibercept (EYLEA) SOLN 2 mg    aflibercept (EYLEA) SOLN 2 mg    2. Essential hypertension  I10     3. Hypertensive retinopathy of both eyes  H35.033     4. Pseudophakia, both eyes  Z96.1  1. Moderate Non-proliferative diabetic retinopathy, both eyes  - s/p IVA OD #1 (11.14.22), #2 (12.12.22), #3 (01.09.23) -- IVA resistance - s/p IVA OS #1 (10.17.22), #2 (11.14.22), #3 (12.12.22), #4 (01.09.23), #5 (03.15.23), #6 (04.12.23) -- IVA resistance - s/p IVE OD #1 (03.15.23), #2 (04.12.23), #3 (05.10.23), #4 (06.07.23) - s/p IVE OS #1 (05.10.23), #2 (06.07.23) - exam shows scattered MA, DBH OU - FA (10.17.22) shows late leaking MA OU, no NV OU - OCT shows OD: Mild interval increase in IRF/IRHM, OS: Mild interval improvement in IRF/IRHM ST mac, persistent cystic changes centrally  - BCVA 20/25 OU down from 20/20 OU -  recommend IVE OD #5 and IVE OS #3 today, 07.05.23 for DME  - pt wishes to proceed  - RBA of procedure discussed, questions answered - IVE informed consent obtained and signed, 03.15.23 (OD) - IVE informed consent obtained and signed, 05.10.23 (OS) - see procedure note - f/u in 4 wks -- DFE/OCT, possible injection(s)  2,3. Hypertensive retinopathy OU - discussed importance of tight BP control - monitor  4. Pseudophakia OU  - s/p CE/IOL  - IOL in good position, doing well  - monitor  Ophthalmic Meds Ordered this visit:  Meds ordered this encounter  Medications   aflibercept (EYLEA) SOLN 2 mg   aflibercept (EYLEA) SOLN 2 mg     Return in about 4 weeks (around 01/31/2022) for DFE, OCT, possible injection.  There are no Patient Instructions on file for this visit.  Explained the diagnoses, plan, and follow up with the patient and they expressed understanding.  Patient expressed understanding of the importance of proper follow up care.   This document serves as a record of services personally performed by Gardiner Sleeper, MD, PhD. It was created on their behalf by Orvan Falconer, an ophthalmic technician. The creation of this record is the provider's dictation and/or activities during the visit.    Electronically signed by: Orvan Falconer, OA, 01/03/22  12:35 PM  This document serves as a record of services personally performed by Gardiner Sleeper, MD, PhD. It was created on their behalf by Leonie Douglas, an ophthalmic technician. The creation of this record is the provider's dictation and/or activities during the visit.    Electronically signed by: Leonie Douglas COA, 01/03/22  12:35 PM  Gardiner Sleeper, M.D., Ph.D. Diseases & Surgery of the Retina and Vitreous [facility]  I have reviewed the above documentation for accuracy and completeness, and I agree with the above. Gardiner Sleeper, M.D., Ph.D. 01/03/22 12:36 PM  Abbreviations: M myopia (nearsighted);  A astigmatism; H hyperopia (farsighted); P presbyopia; Mrx spectacle prescription;  CTL contact lenses; OD right eye; OS left eye; OU both eyes  XT exotropia; ET esotropia; PEK punctate epithelial keratitis; PEE punctate epithelial erosions; DES dry eye syndrome; MGD meibomian gland dysfunction; ATs artificial tears; PFAT's preservative free artificial tears; [place] nuclear sclerotic cataract; PSC posterior subcapsular cataract; ERM epi-retinal membrane; PVD posterior vitreous detachment; RD retinal detachment; DM diabetes mellitus; DR diabetic retinopathy; NPDR non-proliferative diabetic retinopathy; PDR proliferative diabetic retinopathy; CSME clinically significant macular edema; DME diabetic macular edema; dbh dot blot hemorrhages; CWS cotton wool spot; POAG primary open angle glaucoma; C/D cup-to-disc ratio; HVF humphrey visual field; GVF goldmann visual field; OCT optical coherence tomography; IOP intraocular pressure; BRVO Branch retinal vein occlusion; CRVO central retinal vein occlusion; CRAO central retinal artery occlusion; BRAO branch retinal artery occlusion; RT retinal tear; SB scleral buckle; PPV pars plana vitrectomy; VH  Vitreous hemorrhage; PRP panretinal laser photocoagulation; IVK intravitreal kenalog; VMT vitreomacular traction; MH Macular hole;  NVD neovascularization of the disc; NVE neovascularization elsewhere; AREDS age related eye disease study; ARMD age related macular degeneration; POAG primary open angle glaucoma; EBMD epithelial/anterior basement membrane dystrophy; ACIOL anterior chamber intraocular lens; IOL intraocular lens; PCIOL posterior chamber intraocular lens; Phaco/IOL phacoemulsification with intraocular lens placement; [place] photorefractive keratectomy; LASIK laser assisted in situ keratomileusis; HTN hypertension; DM diabetes mellitus; COPD chronic obstructive pulmonary disease

## 2021-12-29 ENCOUNTER — Other Ambulatory Visit: Payer: Self-pay | Admitting: Internal Medicine

## 2021-12-29 DIAGNOSIS — E119 Type 2 diabetes mellitus without complications: Secondary | ICD-10-CM

## 2022-01-01 ENCOUNTER — Other Ambulatory Visit: Payer: Self-pay | Admitting: Internal Medicine

## 2022-01-01 ENCOUNTER — Telehealth: Payer: Self-pay | Admitting: Internal Medicine

## 2022-01-01 DIAGNOSIS — Z794 Long term (current) use of insulin: Secondary | ICD-10-CM

## 2022-01-01 DIAGNOSIS — E119 Type 2 diabetes mellitus without complications: Secondary | ICD-10-CM

## 2022-01-01 MED ORDER — INSULIN PEN NEEDLE 31G X 8 MM MISC: 1.0000 | Freq: Four times a day (QID) | 2 refills | Status: DC

## 2022-01-01 NOTE — Telephone Encounter (Signed)
Pt's daughter called to request we only subscribe the Insulin Pen Needle 31G X 8 MM, and not the 4MM Needles in the future. Pt states the 4MM needles bend or break too frequently.

## 2022-01-03 ENCOUNTER — Ambulatory Visit (INDEPENDENT_AMBULATORY_CARE_PROVIDER_SITE_OTHER): Payer: Medicare Other | Admitting: Ophthalmology

## 2022-01-03 ENCOUNTER — Encounter (INDEPENDENT_AMBULATORY_CARE_PROVIDER_SITE_OTHER): Payer: Self-pay | Admitting: Ophthalmology

## 2022-01-03 ENCOUNTER — Ambulatory Visit (INDEPENDENT_AMBULATORY_CARE_PROVIDER_SITE_OTHER): Payer: Medicare Other | Admitting: *Deleted

## 2022-01-03 DIAGNOSIS — E113313 Type 2 diabetes mellitus with moderate nonproliferative diabetic retinopathy with macular edema, bilateral: Secondary | ICD-10-CM

## 2022-01-03 DIAGNOSIS — Z961 Presence of intraocular lens: Secondary | ICD-10-CM

## 2022-01-03 DIAGNOSIS — I10 Essential (primary) hypertension: Secondary | ICD-10-CM | POA: Diagnosis not present

## 2022-01-03 DIAGNOSIS — I480 Paroxysmal atrial fibrillation: Secondary | ICD-10-CM

## 2022-01-03 DIAGNOSIS — H35033 Hypertensive retinopathy, bilateral: Secondary | ICD-10-CM

## 2022-01-03 LAB — POCT INR: INR: 2.2 (ref 2.0–3.0)

## 2022-01-03 MED ORDER — AFLIBERCEPT 2MG/0.05ML IZ SOLN FOR KALEIDOSCOPE
2.0000 mg | INTRAVITREAL | Status: AC | PRN
  Administered 2022-01-03: 2 mg via INTRAVITREAL

## 2022-01-03 NOTE — Patient Instructions (Signed)
Description   Continue taking 1.5 tablets daily except for 1 tablet every Monday, Wednesday and Friday.  Recheck INR in 4 weeks.  Anticoagulation Clinic [phone]     

## 2022-01-18 ENCOUNTER — Other Ambulatory Visit (HOSPITAL_COMMUNITY): Payer: Self-pay | Admitting: Cardiovascular Disease

## 2022-01-18 DIAGNOSIS — I739 Peripheral vascular disease, unspecified: Secondary | ICD-10-CM

## 2022-01-24 NOTE — Progress Notes (Signed)
[facility] Note  01/31/2022    CHIEF COMPLAINT Patient presents for Retina Follow Up  HISTORY OF PRESENT ILLNESS: William [name] is a 71 y.o. male who presents to the clinic today for:   HPI     Retina Follow Up   Patient presents with  Diabetic Retinopathy.  In both eyes.  This started 4 weeks ago.  I, the attending physician,  performed the HPI with the patient and updated documentation appropriately.        Comments   Patient here for 4 weeks retina follow up for NPDR OU. Patient states vision doing ok. No eye pain.      Last edited by [name], MD on 01/31/2022  8:43 AM.    Pt states vision is stable  Referring physician: [name], MD [place],  [place] [address]  HISTORICAL INFORMATION:  Selected notes from the MEDICAL RECORD NUMBER Referred by Dr. [name] for eval of DME OU LEE:  Ocular Hx- PMH-    CURRENT MEDICATIONS: No current outpatient medications on file. (Ophthalmic Drugs)   No current facility-administered medications for this visit. (Ophthalmic Drugs)   Current Outpatient Medications (Other)  Medication Sig   albuterol (PROVENTIL HFA;VENTOLIN HFA) 108 (90 Base) MCG/ACT inhaler Inhale 2 puffs into the lungs every 6 (six) hours as needed for wheezing or shortness of breath.   aspirin EC 81 MG tablet Take 1 tablet (81 mg total) by mouth daily.   carvedilol (COREG) 3.125 MG tablet TAKE 1 TABLET(3.125 MG) BY MOUTH TWICE DAILY WITH A MEAL   celecoxib (CELEBREX) 100 MG capsule Take 1 capsule (100 mg total) by mouth 2 (two) times daily as needed for mild pain or moderate pain.   Continuous Blood Gluc Receiver (FREESTYLE LIBRE 2 READER) DEVI 1 Act by Does not apply route daily.   Continuous Blood Gluc Sensor (FREESTYLE LIBRE 2 SENSOR) MISC USE TO CHECK BLOOD SUGAR AND CHANGE EVERY 14 DAYS   FARXIGA 10 MG TABS tablet TAKE 1 TABLET(10 MG) BY MOUTH DAILY BEFORE BREAKFAST   gabapentin (NEURONTIN) 300 MG capsule  TAKE 1 CAPSULE(300 MG) BY MOUTH TWICE DAILY   HUMALOG KWIKPEN 100 UNIT/ML KwikPen INJECT 10 UNITS UNDER THE SKIN THREE TIMES DAILY AS DIRECTED BY SLIDING SCALE (Patient taking differently: Inject 10-45 Units into the skin 3 (three) times daily. Pt takes with meals)   insulin glargine (LANTUS SOLOSTAR) 100 UNIT/ML Solostar Pen INJECT 55 UNITS UNDER THE SKIN DAILY (Patient taking differently: INJECT 55 UNITS UNDER THE SKIN DAILY PT TAKES IN THE PM)   Insulin Pen Needle 31G X 8 MM MISC 1 each by Does not apply route 4 (four) times daily.   oxyCODONE-acetaminophen (PERCOCET) 5-325 MG tablet Take 1 tablet by mouth every 6 (six) hours as needed for severe pain.   pantoprazole (PROTONIX) 40 MG tablet TAKE 1 TABLET(40 MG) BY MOUTH TWICE DAILY BEFORE A MEAL   pramipexole (MIRAPEX) 0.125 MG tablet TAKE 1 TABLET(0.125 MG) BY MOUTH AT BEDTIME   rosuvastatin (CRESTOR) 40 MG tablet TAKE 1 TABLET BY MOUTH EVERY DAY   torsemide (DEMADEX) 20 MG tablet TAKE 1 TABLET(20 MG) BY MOUTH 3 TIMES A WEEK (Patient taking differently: TAKE 1 TABLET(20 MG) BY MOUTH 3 TIMES A WEEK Monday, Wednesday and Friday)   warfarin (COUMADIN) 5 MG tablet TAKE 1 TO 2 TABLETS BY MOUTH DAILY AS DIRECTED BY COUMADIN CLINIC   Glucagon (GVOKE HYPOPEN 2-PACK) 1 MG/0.2ML SOAJ Inject 1 Act into the  skin daily as needed. (Patient not taking: Reported on 01/31/2022)   No current facility-administered medications for this visit. (Other)   REVIEW OF SYSTEMS: ROS   Positive for: Gastrointestinal, Genitourinary, Endocrine, Eyes Negative for: Constitutional, Neurological, Skin, Musculoskeletal, HENT, Cardiovascular, Respiratory, Psychiatric, Allergic/Imm, Heme/Lymph Last edited by [name], COA on 01/31/2022  7:58 AM.     ALLERGIES Allergies  Allergen Reactions   Morphine And Related Shortness Of Breath and Other (See Comments)    UNSPECIFIED REACTION "Pt said it was too much"    Latex Rash   PAST MEDICAL HISTORY Past Medical History:   Diagnosis Date   AKI (acute kidney injury) ([place])    With STEMI in 9379   Chronic systolic CHF (congestive heart failure) ([place])    Depression    Diabetes mellitus without complication ([name])    Diabetic retinopathy ([place])    GERD (gastroesophageal reflux disease)    Hx of adenomatous colonic polyps 04/07/2018   Hyperlipidemia    Hypertension    Hypertensive retinopathy    Paroxysmal atrial fibrillation ([name])    Peripheral vascular disease ([name])    Pneumonia    Seizures ([name])    hx of as a child   STEMI (ST elevation myocardial infarction) ([place]) 2017   Past Surgical History:  Procedure Laterality Date   ABDOMINAL AORTOGRAM W/LOWER EXTREMITY N/A 09/19/2016   Procedure: Abdominal Aortogram w/Lower Extremity;  Surgeon: Wellington Hampshire, MD;  Location: [facility];  Service: Cardiovascular;  Laterality: N/A;   AMPUTATION Right 03/23/2016   Procedure: 1st and 2nd Ray Amputation Right Foot;  Surgeon: Newt Minion, MD;  Location: [facility];  Service: Orthopedics;  Laterality: Right;   AMPUTATION Right 06/21/2016   Procedure: RIGHT TRANSMETATARSAL AMPUTATION;  Surgeon: Newt Minion, MD;  Location: [facility];  Service: Orthopedics;  Laterality: Right;   CARDIAC CATHETERIZATION N/A 03/13/2016   Procedure: Right/Left Heart Cath and Coronary Angiography;  Surgeon: Sherren Mocha, MD;  Location: [facility];  Service: Cardiovascular;  Laterality: N/A;   CARDIAC CATHETERIZATION N/A 03/13/2016   Procedure: IABP Insertion;  Surgeon: Sherren Mocha, MD;  Location: [facility];  Service: Cardiovascular;  Laterality: N/A;   CATARACT EXTRACTION     CATARACT EXTRACTION, BILATERAL     CERVICAL FUSION  1982, 1992   has had 3 neck surgeries from breaking his neck   CIRCUMCISION N/A 11/07/2021   Procedure: CIRCUMCISION ADULT;  Surgeon: Vira Agar, MD;  Location: [facility];  Service: Urology;  Laterality: N/A;   CORONARY ARTERY BYPASS GRAFT N/A 03/13/2016   Procedure: CORONARY ARTERY BYPASS  GRAFTING (CABG) x 1 (SVG to OM) with EVH from [place];  Surgeon: Ivin Poot, MD;  Location: [facility];  Service: Open Heart Surgery;  Laterality: N/A;   EYE SURGERY     LOWER EXTREMITY ANGIOGRAM  05/02/2016   Procedure: Lower Extremity Angiogram;  Surgeon: Wellington Hampshire, MD;  Location: [facility];  Service: Cardiovascular;;  Limited left femoral runoff right femoral runoff   PERIPHERAL VASCULAR CATHETERIZATION N/A 05/02/2016   Procedure: Abdominal Aortogram;  Surgeon: Wellington Hampshire, MD;  Location: [facility];  Service: Cardiovascular;  Laterality: N/A;   PERIPHERAL VASCULAR CATHETERIZATION Right 05/02/2016   Procedure: Peripheral Vascular Balloon Angioplasty;  Surgeon: Wellington Hampshire, MD;  Location: [facility];  Service: Cardiovascular;  Laterality: Right;  SFA   TEE WITHOUT CARDIOVERSION N/A 03/13/2016   Procedure: TRANSESOPHAGEAL ECHOCARDIOGRAM (TEE);  Surgeon: Ivin Poot, MD;  Location: [facility];  Service: Open Heart Surgery;  Laterality: N/A;   VSD REPAIR N/A 03/13/2016   Procedure: VENTRICULAR SEPTAL DEFECT (VSD) REPAIR;  Surgeon: Ivin Poot, MD;  Location: [facility];  Service: Open Heart Surgery;  Laterality: N/A;   FAMILY HISTORY Family History  Problem Relation Age of Onset   Diabetes Maternal Grandmother    Diabetes Mother    Aneurysm Mother    Peripheral Artery Disease Mother    Coronary artery disease Mother    Peptic Ulcer Father    Retinoblastoma Daughter    Colon cancer Neg Hx    Rectal cancer Neg Hx    SOCIAL HISTORY Social History   Tobacco Use   Smoking status: Former    Packs/day: 0.50    Types: Cigarettes    Quit date: 11/20/2015    Years since quitting: 6.2   Smokeless tobacco: Never   Tobacco comments:    quit 2018  Vaping Use   Vaping Use: Never used  Substance Use Topics   Alcohol use: No   Drug use: No       OPHTHALMIC EXAM: Base Eye Exam     Visual Acuity (Snellen - Linear)       Right  Left   Dist [place] 20/40 20/30 -2   Dist ph [place] 20/25 20/25 -2         Tonometry (Tonopen, 7:56 AM)       Right Left   Pressure 05 07         Pupils       Dark Light Shape React APD   Right 2 1 Round Brisk None   Left 2 1 Round Brisk None         Visual Fields (Counting fingers)       Left Right    Full Full         Extraocular Movement       Right Left    Full, Ortho Full, Ortho         Neuro/Psych     Oriented x3: Yes   Mood/Affect: Normal         Dilation     Both eyes: 1.0% Mydriacyl, 2.5% Phenylephrine @ 7:56 AM           Slit Lamp and Fundus Exam     Slit Lamp Exam       Right Left   Lids/Lashes Dermatochalasis - upper lid, mild MGD Dermatochalasis - upper lid, mild MGD   Conjunctiva/Sclera White and quiet White and quiet   Cornea trace PEE trace PEE   Anterior Chamber Deep and quiet Deep and quiet   Iris Round and dilated, No NVI Round and dilated, No NVI   Lens PC IOL in good position PC IOL in good position   Anterior Vitreous Vitreous syneresis, vitreous condensations Vitreous syneresis, Posterior vitreous detachment, vitreous condensations         Fundus Exam       Right Left   Disc Pink and Sharp mild Pallor, Sharp rim, mild tilt   C/D Ratio 0.3 0.3   Macula Flat, Blunted foveal reflex, scatted Microaneurysms/DBH - improved, scattered Cystic changes/edema greatest superior mac -- improving Blunted foveal reflex, central IRF/edema - slightly improved, +MA/DBH -- improved   Vessels attenuated, mild tortuosity attenuated, Tortuous, mild Copper wiring   Periphery Attached, +MA greatest posteriorly Attached, scattered MA greatest posteriorly           IMAGING  AND PROCEDURES  Imaging and Procedures for 01/31/2022  OCT, Retina - OU - Both Eyes       Right Eye Quality was good. Central Foveal Thickness: 330. Progression has improved. Findings include no SRF, abnormal foveal contour, intraretinal hyper-reflective material,  intraretinal fluid (Mild interval improvement in IRF).   Left Eye Quality was good. Central Foveal Thickness: 261. Progression has improved. Findings include no SRF, abnormal foveal contour, intraretinal hyper-reflective material, intraretinal fluid (Mild interval improvement in IRF).   Notes *Images captured and stored on drive  Diagnosis / Impression:  +DME OU OD: Mild interval improvement in IRF OS: Mild interval improvement in IRF  Clinical management:  See below  Abbreviations: NFP - Normal foveal profile. CME - cystoid macular edema. PED - pigment epithelial detachment. IRF - intraretinal fluid. SRF - subretinal fluid. EZ - ellipsoid zone. ERM - epiretinal membrane. ORA - outer retinal atrophy. ORT - outer retinal tubulation. SRHM - subretinal hyper-reflective material. IRHM - intraretinal hyper-reflective material      Intravitreal Injection, Pharmacologic Agent - OD - Right Eye       Time Out 01/31/2022. 8:05 AM. Confirmed correct patient, procedure, site, and patient consented.   Anesthesia Topical anesthesia was used. Anesthetic medications included Lidocaine 2%, Proparacaine 0.5%.   Procedure Preparation included 5% betadine to ocular surface, eyelid speculum. A (32g) needle was used.   Injection: 2 mg aflibercept 2 MG/0.05ML   Route: Intravitreal, Site: Right Eye   NDC: A3590391, Lot: [id number], Expiration date: 10/30/2022, Waste: 0 mL   Post-op Post injection exam found visual acuity of at least counting fingers. The patient tolerated the procedure well. There were no complications. The patient received written and verbal post procedure care education. Post injection medications were not given.      Intravitreal Injection, Pharmacologic Agent - OS - Left Eye       Time Out 01/31/2022. 8:05 AM. Confirmed correct patient, procedure, site, and patient consented.   Anesthesia Topical anesthesia was used. Anesthetic medications included Lidocaine 2%,  Proparacaine 0.5%.   Procedure Preparation included 5% betadine to ocular surface, eyelid speculum. A (32g) needle was used.   Injection: 2 mg aflibercept 2 MG/0.05ML   Route: Intravitreal, Site: Left Eye   NDC: M7179715, Lot: [id number], Expiration date: 10/30/2022, Waste: 0 mL   Post-op Post injection exam found visual acuity of at least counting fingers. The patient tolerated the procedure well. There were no complications. The patient received written and verbal post procedure care education. Post injection medications were not given.             ASSESSMENT/PLAN:    ICD-10-CM   1. Moderate nonproliferative diabetic retinopathy of both eyes with macular edema associated with type 2 diabetes mellitus ([name])  E11.3313 OCT, Retina - OU - Both Eyes    Intravitreal Injection, Pharmacologic Agent - OD - Right Eye    Intravitreal Injection, Pharmacologic Agent - OS - Left Eye    aflibercept (EYLEA) SOLN 2 mg    aflibercept (EYLEA) SOLN 2 mg    2. Essential hypertension  I10     3. Hypertensive retinopathy of both eyes  H35.033     4. Pseudophakia, both eyes  Z96.1       1. Moderate Non-proliferative diabetic retinopathy, both eyes  - s/p IVA OD #1 (11.14.22), #2 (12.12.22), #3 (01.09.23) -- IVA resistance - s/p IVA OS #1 (10.17.22), #2 (11.14.22), #3 (12.12.22), #4 (01.09.23), #5 (03.15.23), #6 (04.12.23) -- IVA resistance - s/p IVE OD #1 (  03.15.23), #2 (04.12.23), #3 (05.10.23), #4 (06.07.23), #5 (07.05.23) - s/p IVE OS #1 (05.10.23), #2 (06.07.23), #3 (07.05.23) - exam shows scattered MA, DBH OU - FA (10.17.22) shows late leaking MA OU, no NV OU - OCT shows Mild interval improvement in IRF OU - BCVA stable at 20/25 OU - recommend IVE OD #6 and IVE OS #4 today, 08.02.23 for DME  - pt wishes to proceed - RBA of procedure discussed, questions answered - IVE informed consent obtained and signed, 03.15.23 (OD) - IVE informed consent obtained and signed, 05.10.23 (OS) -  see procedure note - f/u in 4 wks -- DFE/OCT, possible injection(s)  2,3. Hypertensive retinopathy OU - discussed importance of tight BP control - monitor  4. Pseudophakia OU  - s/p CE/IOL  - IOL in good position, doing well  - monitor  Ophthalmic Meds Ordered this visit:  Meds ordered this encounter  Medications   aflibercept (EYLEA) SOLN 2 mg   aflibercept (EYLEA) SOLN 2 mg     Return in about 4 weeks (around 02/28/2022) for f/u NPDR OU, DFE, OCT.  There are no Patient Instructions on file for this visit.  Explained the diagnoses, plan, and follow up with the patient and they expressed understanding.  Patient expressed understanding of the importance of proper follow up care.   This document serves as a record of services personally performed by Gardiner Sleeper, MD, PhD. It was created on their behalf by Orvan Falconer, an ophthalmic technician. The creation of this record is the provider's dictation and/or activities during the visit.    Electronically signed by: Orvan Falconer, OA, 01/31/22  8:44 AM  This document serves as a record of services personally performed by Gardiner Sleeper, MD, PhD. It was created on their behalf by San Jetty. Owens Shark, OA an ophthalmic technician. The creation of this record is the provider's dictation and/or activities during the visit.    Electronically signed by: San Jetty. Owens Shark, [place] 08.02.2023 8:44 AM  Gardiner Sleeper, M.D., Ph.D. Diseases & Surgery of the Retina and Vitreous [facility]  I have reviewed the above documentation for accuracy and completeness, and I agree with the above. Gardiner Sleeper, M.D., Ph.D. 01/31/22 8:46 AM  Abbreviations: M myopia (nearsighted); A astigmatism; H hyperopia (farsighted); P presbyopia; Mrx spectacle prescription;  CTL contact lenses; OD right eye; OS left eye; OU both eyes  XT exotropia; ET esotropia; PEK punctate epithelial keratitis; PEE punctate epithelial erosions; DES dry eye  syndrome; MGD meibomian gland dysfunction; ATs artificial tears; PFAT's preservative free artificial tears; [place] nuclear sclerotic cataract; PSC posterior subcapsular cataract; ERM epi-retinal membrane; PVD posterior vitreous detachment; RD retinal detachment; DM diabetes mellitus; DR diabetic retinopathy; NPDR non-proliferative diabetic retinopathy; PDR proliferative diabetic retinopathy; CSME clinically significant macular edema; DME diabetic macular edema; dbh dot blot hemorrhages; CWS cotton wool spot; POAG primary open angle glaucoma; C/D cup-to-disc ratio; HVF humphrey visual field; GVF goldmann visual field; OCT optical coherence tomography; IOP intraocular pressure; BRVO Branch retinal vein occlusion; CRVO central retinal vein occlusion; CRAO central retinal artery occlusion; BRAO branch retinal artery occlusion; RT retinal tear; SB scleral buckle; PPV pars plana vitrectomy; VH Vitreous hemorrhage; PRP panretinal laser photocoagulation; IVK intravitreal kenalog; VMT vitreomacular traction; MH Macular hole;  NVD neovascularization of the disc; NVE neovascularization elsewhere; AREDS age related eye disease study; ARMD age related macular degeneration; POAG primary open angle glaucoma; EBMD epithelial/anterior basement membrane dystrophy; ACIOL anterior chamber intraocular lens; IOL intraocular lens; PCIOL  posterior chamber intraocular lens; Phaco/IOL phacoemulsification with intraocular lens placement; PRK photorefractive keratectomy; LASIK laser assisted in situ keratomileusis; HTN hypertension; DM diabetes mellitus; COPD chronic obstructive pulmonary disease 

## 2022-01-29 ENCOUNTER — Ambulatory Visit (HOSPITAL_COMMUNITY)
Admission: RE | Admit: 2022-01-29 | Discharge: 2022-01-29 | Disposition: A | Discharge status: Home / Self Care | Payer: Medicare Other | Source: Ambulatory Visit | Attending: Cardiology | Admitting: Cardiology

## 2022-01-29 ENCOUNTER — Ambulatory Visit (INDEPENDENT_AMBULATORY_CARE_PROVIDER_SITE_OTHER): Payer: Medicare Other | Admitting: *Deleted

## 2022-01-29 DIAGNOSIS — I480 Paroxysmal atrial fibrillation: Secondary | ICD-10-CM | POA: Diagnosis not present

## 2022-01-29 DIAGNOSIS — I739 Peripheral vascular disease, unspecified: Secondary | ICD-10-CM | POA: Diagnosis not present

## 2022-01-29 LAB — POCT INR: INR: 2 (ref 2.0–3.0)

## 2022-01-29 NOTE — Patient Instructions (Signed)
Description   Continue taking 1.5 tablets daily except for 1 tablet every Monday, Wednesday and Friday.  Recheck INR in 4 weeks.  Anticoagulation Clinic [phone]     

## 2022-01-31 ENCOUNTER — Encounter (INDEPENDENT_AMBULATORY_CARE_PROVIDER_SITE_OTHER): Payer: Self-pay | Admitting: Ophthalmology

## 2022-01-31 ENCOUNTER — Ambulatory Visit (INDEPENDENT_AMBULATORY_CARE_PROVIDER_SITE_OTHER): Payer: Medicare Other | Admitting: Ophthalmology

## 2022-01-31 DIAGNOSIS — E113313 Type 2 diabetes mellitus with moderate nonproliferative diabetic retinopathy with macular edema, bilateral: Secondary | ICD-10-CM | POA: Diagnosis not present

## 2022-01-31 DIAGNOSIS — Z961 Presence of intraocular lens: Secondary | ICD-10-CM

## 2022-01-31 DIAGNOSIS — I10 Essential (primary) hypertension: Secondary | ICD-10-CM

## 2022-01-31 DIAGNOSIS — H35033 Hypertensive retinopathy, bilateral: Secondary | ICD-10-CM | POA: Diagnosis not present

## 2022-01-31 MED ORDER — AFLIBERCEPT 2MG/0.05ML IZ SOLN FOR KALEIDOSCOPE
2.0000 mg | INTRAVITREAL | Status: AC | PRN
  Administered 2022-01-31: 2 mg via INTRAVITREAL

## 2022-02-05 ENCOUNTER — Other Ambulatory Visit: Payer: Self-pay | Admitting: *Deleted

## 2022-02-05 DIAGNOSIS — I739 Peripheral vascular disease, unspecified: Secondary | ICD-10-CM

## 2022-02-15 NOTE — Progress Notes (Signed)
[facility] Note  03/01/2022    CHIEF COMPLAINT Patient presents for Retina Follow Up  HISTORY OF PRESENT ILLNESS: William [name] is a 71 y.o. male who presents to the clinic today for:   HPI     Retina Follow Up   Patient presents with  Diabetic Retinopathy.  In both eyes.  This started 4 weeks ago.  I, the attending physician,  performed the HPI with the patient and updated documentation appropriately.        Comments   Patient here for 4 weeks retina follow up for NPDR OU. Patient states vision doing alright. No eye pain.       Last edited by [name], MD on 03/01/2022  8:26 AM.    Pt states blood sugar has been doing well, it was 129 this morning. Reports episodes of elevated BP  Referring physician: [name], [place],  [place] [address]  HISTORICAL INFORMATION:  Selected notes from the MEDICAL RECORD NUMBER Referred by Dr. [name] for eval of DME OU LEE:  Ocular Hx- PMH-    CURRENT MEDICATIONS: No current outpatient medications on file. (Ophthalmic Drugs)   No current facility-administered medications for this visit. (Ophthalmic Drugs)   Current Outpatient Medications (Other)  Medication Sig   albuterol (PROVENTIL HFA;VENTOLIN HFA) 108 (90 Base) MCG/ACT inhaler Inhale 2 puffs into the lungs every 6 (six) hours as needed for wheezing or shortness of breath.   aspirin EC 81 MG tablet Take 1 tablet (81 mg total) by mouth daily.   carvedilol (COREG) 3.125 MG tablet TAKE 1 TABLET(3.125 MG) BY MOUTH TWICE DAILY WITH A MEAL   celecoxib (CELEBREX) 100 MG capsule Take 1 capsule (100 mg total) by mouth 2 (two) times daily as needed for mild pain or moderate pain.   Continuous Blood Gluc Receiver (FREESTYLE LIBRE 2 READER) DEVI 1 Act by Does not apply route daily.   Continuous Blood Gluc Sensor (FREESTYLE LIBRE 2 SENSOR) MISC USE TO CHECK BLOOD SUGAR AND CHANGE EVERY 14 DAYS   FARXIGA 10 MG TABS tablet TAKE 1  TABLET(10 MG) BY MOUTH DAILY BEFORE BREAKFAST   gabapentin (NEURONTIN) 300 MG capsule TAKE 1 CAPSULE(300 MG) BY MOUTH TWICE DAILY   HUMALOG KWIKPEN 100 UNIT/ML KwikPen INJECT 10 UNITS UNDER THE SKIN THREE TIMES DAILY AS DIRECTED BY SLIDING SCALE (Patient taking differently: Inject 10-45 Units into the skin 3 (three) times daily. Pt takes with meals)   insulin glargine (LANTUS SOLOSTAR) 100 UNIT/ML Solostar Pen INJECT 55 UNITS UNDER THE SKIN DAILY (Patient taking differently: INJECT 55 UNITS UNDER THE SKIN DAILY PT TAKES IN THE PM)   Insulin Pen Needle 31G X 8 MM MISC 1 each by Does not apply route 4 (four) times daily.   oxyCODONE-acetaminophen (PERCOCET) 5-325 MG tablet Take 1 tablet by mouth every 6 (six) hours as needed for severe pain.   pantoprazole (PROTONIX) 40 MG tablet TAKE 1 TABLET(40 MG) BY MOUTH TWICE DAILY BEFORE A MEAL   pramipexole (MIRAPEX) 0.125 MG tablet TAKE 1 TABLET(0.125 MG) BY MOUTH AT BEDTIME   rosuvastatin (CRESTOR) 40 MG tablet TAKE 1 TABLET BY MOUTH EVERY DAY   torsemide (DEMADEX) 20 MG tablet TAKE 1 TABLET(20 MG) BY MOUTH 3 TIMES A WEEK (Patient taking differently: TAKE 1 TABLET(20 MG) BY MOUTH 3 TIMES A WEEK Monday, Wednesday and Friday)   warfarin (COUMADIN) 5 MG tablet TAKE 1 TO 2 TABLETS BY MOUTH DAILY AS DIRECTED BY COUMADIN CLINIC  Glucagon (GVOKE HYPOPEN 2-PACK) 1 MG/0.2ML SOAJ Inject 1 Act into the skin daily as needed. (Patient not taking: Reported on 01/31/2022)   No current facility-administered medications for this visit. (Other)   REVIEW OF SYSTEMS: ROS   Positive for: Gastrointestinal, Genitourinary, Endocrine, Eyes Negative for: Constitutional, Neurological, Skin, Musculoskeletal, HENT, Cardiovascular, Respiratory, Psychiatric, Allergic/Imm, Heme/Lymph Last edited by [name], COA on 03/01/2022  7:53 AM.     ALLERGIES Allergies  Allergen Reactions   Morphine And Related Shortness Of Breath and Other (See Comments)    UNSPECIFIED  REACTION "Pt said it was too much"    Latex Rash   PAST MEDICAL HISTORY Past Medical History:  Diagnosis Date   AKI (acute kidney injury) ([place])    With STEMI in 1610   Chronic systolic CHF (congestive heart failure) ([place])    Depression    Diabetes mellitus without complication ([name])    Diabetic retinopathy ([place])    GERD (gastroesophageal reflux disease)    Hx of adenomatous colonic polyps 04/07/2018   Hyperlipidemia    Hypertension    Hypertensive retinopathy    Paroxysmal atrial fibrillation ([name])    Peripheral vascular disease ([name])    Pneumonia    Seizures ([name])    hx of as a child   STEMI (ST elevation myocardial infarction) ([place]) 2017   Past Surgical History:  Procedure Laterality Date   ABDOMINAL AORTOGRAM W/LOWER EXTREMITY N/A 09/19/2016   Procedure: Abdominal Aortogram w/Lower Extremity;  Surgeon: Wellington Hampshire, MD;  Location: [facility];  Service: Cardiovascular;  Laterality: N/A;   AMPUTATION Right 03/23/2016   Procedure: 1st and 2nd Ray Amputation Right Foot;  Surgeon: Newt Minion, MD;  Location: [facility];  Service: Orthopedics;  Laterality: Right;   AMPUTATION Right 06/21/2016   Procedure: RIGHT TRANSMETATARSAL AMPUTATION;  Surgeon: Newt Minion, MD;  Location: [facility];  Service: Orthopedics;  Laterality: Right;   CARDIAC CATHETERIZATION N/A 03/13/2016   Procedure: Right/Left Heart Cath and Coronary Angiography;  Surgeon: Sherren Mocha, MD;  Location: [facility];  Service: Cardiovascular;  Laterality: N/A;   CARDIAC CATHETERIZATION N/A 03/13/2016   Procedure: IABP Insertion;  Surgeon: Sherren Mocha, MD;  Location: [facility];  Service: Cardiovascular;  Laterality: N/A;   CATARACT EXTRACTION     CATARACT EXTRACTION, BILATERAL     CERVICAL FUSION  1982, 1992   has had 3 neck surgeries from breaking his neck   CIRCUMCISION N/A 11/07/2021   Procedure: CIRCUMCISION ADULT;  Surgeon: Vira Agar, MD;  Location: [facility];  Service: Urology;   Laterality: N/A;   CORONARY ARTERY BYPASS GRAFT N/A 03/13/2016   Procedure: CORONARY ARTERY BYPASS GRAFTING (CABG) x 1 (SVG to OM) with EVH from [place];  Surgeon: Ivin Poot, MD;  Location: [facility];  Service: Open Heart Surgery;  Laterality: N/A;   EYE SURGERY     LOWER EXTREMITY ANGIOGRAM  05/02/2016   Procedure: Lower Extremity Angiogram;  Surgeon: Wellington Hampshire, MD;  Location: [facility];  Service: Cardiovascular;;  Limited left femoral runoff right femoral runoff   PERIPHERAL VASCULAR CATHETERIZATION N/A 05/02/2016   Procedure: Abdominal Aortogram;  Surgeon: Wellington Hampshire, MD;  Location: [facility];  Service: Cardiovascular;  Laterality: N/A;   PERIPHERAL VASCULAR CATHETERIZATION Right 05/02/2016   Procedure: Peripheral Vascular Balloon Angioplasty;  Surgeon: Wellington Hampshire, MD;  Location: [facility];  Service: Cardiovascular;  Laterality: Right;  SFA  TEE WITHOUT CARDIOVERSION N/A 03/13/2016   Procedure: TRANSESOPHAGEAL ECHOCARDIOGRAM (TEE);  Surgeon: Ivin Poot, MD;  Location: [facility];  Service: Open Heart Surgery;  Laterality: N/A;   VSD REPAIR N/A 03/13/2016   Procedure: VENTRICULAR SEPTAL DEFECT (VSD) REPAIR;  Surgeon: Ivin Poot, MD;  Location: [facility];  Service: Open Heart Surgery;  Laterality: N/A;   FAMILY HISTORY Family History  Problem Relation Age of Onset   Diabetes Maternal Grandmother    Diabetes Mother    Aneurysm Mother    Peripheral Artery Disease Mother    Coronary artery disease Mother    Peptic Ulcer Father    Retinoblastoma Daughter    Colon cancer Neg Hx    Rectal cancer Neg Hx    SOCIAL HISTORY Social History   Tobacco Use   Smoking status: Former    Packs/day: 0.50    Types: Cigarettes    Quit date: 11/20/2015    Years since quitting: 6.2   Smokeless tobacco: Never   Tobacco comments:    quit 2018  Vaping Use   Vaping Use: Never used  Substance Use Topics   Alcohol use: No   Drug  use: No       OPHTHALMIC EXAM: Base Eye Exam     Visual Acuity (Snellen - Linear)       Right Left   Dist [place] 20/30 20/30   Dist ph [place] 20/25 -1 20/25         Tonometry (Tonopen, 7:51 AM)       Right Left   Pressure 10 12         Pupils       Dark Light Shape React APD   Right 2 1 Round Brisk None   Left 2 1 Round Brisk None         Visual Fields (Counting fingers)       Left Right    Full Full         Extraocular Movement       Right Left    Full, Ortho Full, Ortho         Neuro/Psych     Oriented x3: Yes   Mood/Affect: Normal         Dilation     Both eyes: 1.0% Mydriacyl, 2.5% Phenylephrine @ 7:51 AM           Slit Lamp and Fundus Exam     Slit Lamp Exam       Right Left   Lids/Lashes Dermatochalasis - upper lid, mild MGD Dermatochalasis - upper lid, mild MGD   Conjunctiva/Sclera White and quiet White and quiet   Cornea trace PEE trace PEE   Anterior Chamber Deep and quiet Deep and quiet   Iris Round and dilated, No NVI Round and dilated, No NVI   Lens PC IOL in good position PC IOL in good position   Anterior Vitreous Vitreous syneresis, vitreous condensations Vitreous syneresis, Posterior vitreous detachment, vitreous condensations         Fundus Exam       Right Left   Disc Pink and Sharp mild Pallor, Sharp rim, mild tilt   C/D Ratio 0.3 0.3   Macula Flat, Blunted foveal reflex, scatted Microaneurysms/DBH - improved, scattered Cystic changes/edema greatest superior mac -- persistent Blunted foveal reflex, central IRF/edema - persistent, +MA/DBH -- improved   Vessels attenuated, mild tortuosity attenuated, Tortuous, mild Copper wiring   Periphery Attached, +MA greatest posteriorly Attached, scattered MA greatest posteriorly  IMAGING AND PROCEDURES  Imaging and Procedures for 03/01/2022  OCT, Retina - OU - Both Eyes       Right Eye Quality was good. Central Foveal Thickness: 319. Progression has improved.  Findings include no SRF, abnormal foveal contour, intraretinal hyper-reflective material, intraretinal fluid (persistent IRF -- slight improvement).   Left Eye Quality was good. Central Foveal Thickness: 261. Progression has been stable. Findings include no SRF, abnormal foveal contour, intraretinal hyper-reflective material, intraretinal fluid (persistent IRF -- slight improvement).   Notes *Images captured and stored on drive  Diagnosis / Impression:  +DME OU OD: persistent IRF -- slight improvement OS: persistent IRF -- ?slight improvement  Clinical management:  See below  Abbreviations: NFP - Normal foveal profile. CME - cystoid macular edema. PED - pigment epithelial detachment. IRF - intraretinal fluid. SRF - subretinal fluid. EZ - ellipsoid zone. ERM - epiretinal membrane. ORA - outer retinal atrophy. ORT - outer retinal tubulation. SRHM - subretinal hyper-reflective material. IRHM - intraretinal hyper-reflective material      Intravitreal Injection, Pharmacologic Agent - OS - Left Eye       Time Out 03/01/2022. 8:06 AM. Confirmed correct patient, procedure, site, and patient consented.   Anesthesia Topical anesthesia was used. Anesthetic medications included Lidocaine 2%, Proparacaine 0.5%.   Procedure Preparation included 5% betadine to ocular surface, eyelid speculum. A (32g) needle was used.   Injection: 2 mg aflibercept 2 MG/0.05ML   Route: Intravitreal, Site: Left Eye   NDC: A3590391, Lot: [id number], Expiration date: 10/30/2022, Waste: 0 mL   Post-op Post injection exam found visual acuity of at least counting fingers. The patient tolerated the procedure well. There were no complications. The patient received written and verbal post procedure care education. Post injection medications were not given.      Intravitreal Injection, Pharmacologic Agent - OD - Right Eye       Time Out 03/01/2022. 8:06 AM. Confirmed correct patient, procedure, site, and patient  consented.   Anesthesia Topical anesthesia was used. Anesthetic medications included Lidocaine 2%, Proparacaine 0.5%.   Procedure Preparation included 5% betadine to ocular surface, eyelid speculum. A (32g) needle was used.   Injection: 2 mg aflibercept 2 MG/0.05ML   Route: Intravitreal, Site: Right Eye   NDC: A3590391, Lot: [id number], Expiration date: 08/30/2022, Waste: 0 mL   Post-op Post injection exam found visual acuity of at least counting fingers. The patient tolerated the procedure well. There were no complications. The patient received written and verbal post procedure care education. Post injection medications were not given.            ASSESSMENT/PLAN:    ICD-10-CM   1. Moderate nonproliferative diabetic retinopathy of both eyes with macular edema associated with type 2 diabetes mellitus ([name])  E11.3313 OCT, Retina - OU - Both Eyes    Intravitreal Injection, Pharmacologic Agent - OS - Left Eye    Intravitreal Injection, Pharmacologic Agent - OD - Right Eye    aflibercept (EYLEA) SOLN 2 mg    aflibercept (EYLEA) SOLN 2 mg    2. Essential hypertension  I10     3. Hypertensive retinopathy of both eyes  H35.033     4. Pseudophakia, both eyes  Z96.1       1. Moderate Non-proliferative diabetic retinopathy, both eyes  - s/p IVA OD #1 (11.14.22), #2 (12.12.22), #3 (01.09.23) -- IVA resistance - s/p IVA OS #1 (10.17.22), #2 (11.14.22), #3 (12.12.22), #4 (01.09.23), #5 (03.15.23), #6 (04.12.23) -- IVA resistance - s/p IVE OD #  1 (03.15.23), #2 (04.12.23), #3 (05.10.23), #4 (06.07.23), #5 (07.05.23), #6 (08.02.23) - s/p IVE OS #1 (05.10.23), #2 (06.07.23), #3 (07.05.23), #4 (08.02.23) - exam shows scattered MA, DBH OU - FA (10.17.22) shows late leaking MA OU, no NV OU - OCT shows OD: persistent IRF -- slight improvement; OS: persistent IRF -- ?slight improvement - discussed possibility of elevated BP blunting efficacy of IVE - BCVA stable at 20/25 OU - recommend  IVE OD #7 and IVE OS #5 today, 08.31.23 for DME  - pt wishes to proceed - RBA of procedure discussed, questions answered - IVE informed consent obtained and signed, 03.15.23 (OD) - IVE informed consent obtained and signed, 05.10.23 (OS) - see procedure note - f/u in 4 wks -- DFE/OCT, possible injection(s)  2,3. Hypertensive retinopathy OU - discussed importance of tight BP control and possibility of elevated BP blunting efficacy of IVE - monitor  4. Pseudophakia OU  - s/p CE/IOL  - IOL in good position, doing well  - monitor  Ophthalmic Meds Ordered this visit:  Meds ordered this encounter  Medications   aflibercept (EYLEA) SOLN 2 mg   aflibercept (EYLEA) SOLN 2 mg     Return in about 4 weeks (around 03/29/2022) for f/u NPDR OU, DFE, OCT.  There are no Patient Instructions on file for this visit.  Explained the diagnoses, plan, and follow up with the patient and they expressed understanding.  Patient expressed understanding of the importance of proper follow up care.   This document serves as a record of services personally performed by Gardiner Sleeper, MD, PhD. It was created on their behalf by San Jetty. Owens Shark, OA an ophthalmic technician. The creation of this record is the provider's dictation and/or activities during the visit.    Electronically signed by: San Jetty. Owens Shark, [place] 08.17.2023 10:56 AM   Gardiner Sleeper, M.D., Ph.D. Diseases & Surgery of the Retina and Vitreous [facility]  I have reviewed the above documentation for accuracy and completeness, and I agree with the above. Gardiner Sleeper, M.D., Ph.D. 03/01/22 10:57 AM   Abbreviations: M myopia (nearsighted); A astigmatism; H hyperopia (farsighted); P presbyopia; Mrx spectacle prescription;  CTL contact lenses; OD right eye; OS left eye; OU both eyes  XT exotropia; ET esotropia; PEK punctate epithelial keratitis; PEE punctate epithelial erosions; DES dry eye syndrome; MGD meibomian gland  dysfunction; ATs artificial tears; PFAT's preservative free artificial tears; [place] nuclear sclerotic cataract; PSC posterior subcapsular cataract; ERM epi-retinal membrane; PVD posterior vitreous detachment; RD retinal detachment; DM diabetes mellitus; DR diabetic retinopathy; NPDR non-proliferative diabetic retinopathy; PDR proliferative diabetic retinopathy; CSME clinically significant macular edema; DME diabetic macular edema; dbh dot blot hemorrhages; CWS cotton wool spot; POAG primary open angle glaucoma; C/D cup-to-disc ratio; HVF humphrey visual field; GVF goldmann visual field; OCT optical coherence tomography; IOP intraocular pressure; BRVO Branch retinal vein occlusion; CRVO central retinal vein occlusion; CRAO central retinal artery occlusion; BRAO branch retinal artery occlusion; RT retinal tear; SB scleral buckle; PPV pars plana vitrectomy; VH Vitreous hemorrhage; PRP panretinal laser photocoagulation; IVK intravitreal kenalog; VMT vitreomacular traction; MH Macular hole;  NVD neovascularization of the disc; NVE neovascularization elsewhere; AREDS age related eye disease study; ARMD age related macular degeneration; POAG primary open angle glaucoma; EBMD epithelial/anterior basement membrane dystrophy; ACIOL anterior chamber intraocular lens; IOL intraocular lens; PCIOL posterior chamber intraocular lens; Phaco/IOL phacoemulsification with intraocular lens placement; Benjamin photorefractive keratectomy; LASIK laser assisted in situ keratomileusis; HTN hypertension; DM diabetes mellitus;  COPD chronic obstructive pulmonary disease

## 2022-02-20 ENCOUNTER — Other Ambulatory Visit: Payer: Self-pay | Admitting: Internal Medicine

## 2022-02-20 DIAGNOSIS — I5022 Chronic systolic (congestive) heart failure: Secondary | ICD-10-CM

## 2022-02-20 DIAGNOSIS — I10 Essential (primary) hypertension: Secondary | ICD-10-CM

## 2022-02-20 DIAGNOSIS — Z794 Long term (current) use of insulin: Secondary | ICD-10-CM

## 2022-02-20 DIAGNOSIS — I2510 Atherosclerotic heart disease of native coronary artery without angina pectoris: Secondary | ICD-10-CM

## 2022-02-26 ENCOUNTER — Ambulatory Visit: Payer: Medicare Other | Attending: Cardiovascular Disease | Admitting: *Deleted

## 2022-02-26 DIAGNOSIS — I480 Paroxysmal atrial fibrillation: Secondary | ICD-10-CM

## 2022-02-26 DIAGNOSIS — Z5181 Encounter for therapeutic drug level monitoring: Secondary | ICD-10-CM | POA: Diagnosis not present

## 2022-02-26 LAB — POCT INR: INR: 2 (ref 2.0–3.0)

## 2022-02-26 NOTE — Patient Instructions (Signed)
Description   Continue taking 1.5 tablets daily except for 1 tablet every Monday, Wednesday and Friday. Recheck INR in 5 weeks. Anticoagulation Clinic [phone]     

## 2022-02-27 ENCOUNTER — Other Ambulatory Visit: Payer: Self-pay | Admitting: Internal Medicine

## 2022-02-27 DIAGNOSIS — K219 Gastro-esophageal reflux disease without esophagitis: Secondary | ICD-10-CM

## 2022-03-01 ENCOUNTER — Encounter (INDEPENDENT_AMBULATORY_CARE_PROVIDER_SITE_OTHER): Payer: Self-pay | Admitting: Ophthalmology

## 2022-03-01 ENCOUNTER — Ambulatory Visit (INDEPENDENT_AMBULATORY_CARE_PROVIDER_SITE_OTHER): Payer: Medicare Other | Admitting: Ophthalmology

## 2022-03-01 DIAGNOSIS — H35033 Hypertensive retinopathy, bilateral: Secondary | ICD-10-CM | POA: Diagnosis not present

## 2022-03-01 DIAGNOSIS — Z961 Presence of intraocular lens: Secondary | ICD-10-CM

## 2022-03-01 DIAGNOSIS — E113313 Type 2 diabetes mellitus with moderate nonproliferative diabetic retinopathy with macular edema, bilateral: Secondary | ICD-10-CM

## 2022-03-01 DIAGNOSIS — I10 Essential (primary) hypertension: Secondary | ICD-10-CM

## 2022-03-01 MED ORDER — AFLIBERCEPT 2MG/0.05ML IZ SOLN FOR KALEIDOSCOPE
2.0000 mg | INTRAVITREAL | Status: AC | PRN
  Administered 2022-03-01: 2 mg via INTRAVITREAL

## 2022-03-09 ENCOUNTER — Other Ambulatory Visit: Payer: Self-pay | Admitting: Internal Medicine

## 2022-03-09 DIAGNOSIS — K219 Gastro-esophageal reflux disease without esophagitis: Secondary | ICD-10-CM

## 2022-03-15 ENCOUNTER — Ambulatory Visit (INDEPENDENT_AMBULATORY_CARE_PROVIDER_SITE_OTHER): Payer: Medicare Other

## 2022-03-15 VITALS — Ht 65.0 in | Wt 178.0 lb

## 2022-03-15 DIAGNOSIS — Z0000 Encounter for general adult medical examination without abnormal findings: Secondary | ICD-10-CM | POA: Diagnosis not present

## 2022-03-15 DIAGNOSIS — Z1211 Encounter for screening for malignant neoplasm of colon: Secondary | ICD-10-CM

## 2022-03-15 NOTE — Patient Instructions (Signed)
William [name] , Thank you for taking time to come for your Medicare Wellness Visit. I appreciate your ongoing commitment to your health goals. Please review the following plan we discussed and let me know if I can assist you in the future.   Screening recommendations/referrals: Colonoscopy: Last done 04/07/2018; due every 3 years (order was placed to Dr. Carlean Purl at [place]) Recommended yearly ophthalmology/optometry visit for glaucoma screening and checkup Recommended yearly dental visit for hygiene and checkup  Vaccinations: Influenza vaccine: due Fall 2023 Pneumococcal vaccine: 09/14/2016, 12/02/2017 Tdap vaccine: 09/14/2016; due every 10 years Shingles vaccine: declined   Covid-19: declined  Advanced directives: No  Conditions/risks identified: Yes  Next appointment: Follow up in one year for your annual wellness visit.   Preventive Care 19 Years and Older, Male  Preventive care refers to lifestyle choices and visits with your health care provider that can promote health and wellness. What does preventive care include? A yearly physical exam. This is also called an annual well check. Dental exams once or twice a year. Routine eye exams. Ask your health care provider how often you should have your eyes checked. Personal lifestyle choices, including: Daily care of your teeth and gums. Regular physical activity. Eating a healthy diet. Avoiding tobacco and drug use. Limiting alcohol use. Practicing safe sex. Taking low doses of aspirin every day. Taking vitamin and mineral supplements as recommended by your health care provider. What happens during an annual well check? The services and screenings done by your health care provider during your annual well check will depend on your age, overall health, lifestyle risk factors, and family history of disease. Counseling  Your health care provider may ask you questions about your: Alcohol use. Tobacco use. Drug use. Emotional  well-being. Home and relationship well-being. Sexual activity. Eating habits. History of falls. Memory and ability to understand (cognition). Work and work Statistician. Screening  You may have the following tests or measurements: Height, weight, and BMI. Blood pressure. Lipid and cholesterol levels. These may be checked every 5 years, or more frequently if you are over 87 years old. Skin check. Lung cancer screening. You may have this screening every year starting at age 25 if you have a 30-pack-year history of smoking and currently smoke or have quit within the past 15 years. Fecal occult blood test (FOBT) of the stool. You may have this test every year starting at age 42. Flexible sigmoidoscopy or colonoscopy. You may have a sigmoidoscopy every 5 years or a colonoscopy every 10 years starting at age 32. Prostate cancer screening. Recommendations will vary depending on your family history and other risks. Hepatitis C blood test. Hepatitis B blood test. Sexually transmitted disease (STD) testing. Diabetes screening. This is done by checking your blood sugar (glucose) after you have not eaten for a while (fasting). You may have this done every 1-3 years. Abdominal aortic aneurysm (AAA) screening. You may need this if you are a current or former smoker. Osteoporosis. You may be screened starting at age 73 if you are at high risk. Talk with your health care provider about your test results, treatment options, and if necessary, the need for more tests. Vaccines  Your health care provider may recommend certain vaccines, such as: Influenza vaccine. This is recommended every year. Tetanus, diphtheria, and acellular pertussis (Tdap, Td) vaccine. You may need a Td booster every 10 years. Zoster vaccine. You may need this after age 21. Pneumococcal 13-valent conjugate (PCV13) vaccine. One dose is recommended after age 48.  Pneumococcal polysaccharide (PPSV23) vaccine. One dose is recommended after  age 98. Talk to your health care provider about which screenings and vaccines you need and how often you need them. This information is not intended to replace advice given to you by your health care provider. Make sure you discuss any questions you have with your health care provider. Document Released: 07/15/2015 Document Revised: 03/07/2016 Document Reviewed: 04/19/2015 Elsevier Interactive Patient Education  2017 [employer] Prevention in the Home Falls can cause injuries. They can happen to people of all ages. There are many things you can do to make your home safe and to help prevent falls. What can I do on the outside of my home? Regularly fix the edges of walkways and driveways and fix any cracks. Remove anything that might make you trip as you walk through a door, such as a raised step or threshold. Trim any bushes or trees on the path to your home. Use bright outdoor lighting. Clear any walking paths of anything that might make someone trip, such as rocks or tools. Regularly check to see if handrails are loose or broken. Make sure that both sides of any steps have handrails. Any raised decks and porches should have guardrails on the edges. Have any leaves, snow, or ice cleared regularly. Use sand or salt on walking paths during winter. Clean up any spills in your garage right away. This includes oil or grease spills. What can I do in the bathroom? Use night lights. Install grab bars by the toilet and in the tub and shower. Do not use towel bars as grab bars. Use non-skid mats or decals in the tub or shower. If you need to sit down in the shower, use a plastic, non-slip stool. Keep the floor dry. Clean up any water that spills on the floor as soon as it happens. Remove soap buildup in the tub or shower regularly. Attach bath mats securely with double-sided non-slip rug tape. Do not have throw rugs and other things on the floor that can make you trip. What can I do in the  bedroom? Use night lights. Make sure that you have a light by your bed that is easy to reach. Do not use any sheets or blankets that are too big for your bed. They should not hang down onto the floor. Have a firm chair that has side arms. You can use this for support while you get dressed. Do not have throw rugs and other things on the floor that can make you trip. What can I do in the kitchen? Clean up any spills right away. Avoid walking on wet floors. Keep items that you use a lot in easy-to-reach places. If you need to reach something above you, use a strong step stool that has a grab bar. Keep electrical cords out of the way. Do not use floor polish or wax that makes floors slippery. If you must use wax, use non-skid floor wax. Do not have throw rugs and other things on the floor that can make you trip. What can I do with my stairs? Do not leave any items on the stairs. Make sure that there are handrails on both sides of the stairs and use them. Fix handrails that are broken or loose. Make sure that handrails are as long as the stairways. Check any carpeting to make sure that it is firmly attached to the stairs. Fix any carpet that is loose or worn. Avoid having throw rugs at the  top or bottom of the stairs. If you do have throw rugs, attach them to the floor with carpet tape. Make sure that you have a light switch at the top of the stairs and the bottom of the stairs. If you do not have them, ask someone to add them for you. What else can I do to help prevent falls? Wear shoes that: Do not have high heels. Have rubber bottoms. Are comfortable and fit you well. Are closed at the toe. Do not wear sandals. If you use a stepladder: Make sure that it is fully opened. Do not climb a closed stepladder. Make sure that both sides of the stepladder are locked into place. Ask someone to hold it for you, if possible. Clearly mark and make sure that you can see: Any grab bars or  handrails. First and last steps. Where the edge of each step is. Use tools that help you move around (mobility aids) if they are needed. These include: Canes. Walkers. Scooters. Crutches. Turn on the lights when you go into a dark area. Replace any light bulbs as soon as they burn out. Set up your furniture so you have a clear path. Avoid moving your furniture around. If any of your floors are uneven, fix them. If there are any pets around you, be aware of where they are. Review your medicines with your doctor. Some medicines can make you feel dizzy. This can increase your chance of falling. Ask your doctor what other things that you can do to help prevent falls. This information is not intended to replace advice given to you by your health care provider. Make sure you discuss any questions you have with your health care provider. Document Released: 04/14/2009 Document Revised: 11/24/2015 Document Reviewed: 07/23/2014 Elsevier Interactive Patient Education  2017 [employer].

## 2022-03-15 NOTE — Progress Notes (Signed)
Virtual Visit via Telephone Note  I connected with  [name] on 03/15/22 at  3:45 PM EDT by telephone and verified that I am speaking with the correct person using two identifiers.  Location: Patient: Home with Daughter [name]) Provider: [place] Persons participating in the virtual visit: [place]   I discussed the limitations, risks, security and privacy concerns of performing an evaluation and management service by telephone and the availability of in person appointments. The patient expressed understanding and agreed to proceed.  Interactive audio and video telecommunications were attempted between this nurse and patient, however failed, due to patient having technical difficulties OR patient did not have access to video capability.  We continued and completed visit with audio only.  Some vital signs may be absent or patient reported.   Sheral Flow, LPN  Subjective:   William [name] is a 71 y.o. male who presents for an Initial Medicare Annual Wellness Visit.  Review of Systems     Cardiac Risk Factors include: advanced age (>57mn, >>5women);diabetes mellitus;dyslipidemia;family history of premature cardiovascular disease;hypertension;male gender;sedentary lifestyle     Objective:    Today's Vitals   03/15/22 1606  Weight: 178 lb (80.7 kg)  Height: '5\' 5"'$  (1.651 m)   Body mass index is 29.62 kg/m.     03/15/2022    3:54 PM 11/07/2021    6:20 AM 11/06/2021    1:42 PM 03/27/2017   11:29 PM 03/20/2017   10:43 AM 10/17/2016   10:26 AM 09/19/2016    9:13 AM  Advanced Directives  Does Patient Have a Medical Advance Directive? No No No No No No No  Would patient like information on creating a medical advance directive? No - Patient declined No - Patient declined     No - Patient declined    Current Medications (verified) Outpatient Encounter Medications as of 03/15/2022  Medication Sig   albuterol (PROVENTIL HFA;VENTOLIN HFA) 108 (90  Base) MCG/ACT inhaler Inhale 2 puffs into the lungs every 6 (six) hours as needed for wheezing or shortness of breath.   aspirin EC 81 MG tablet Take 1 tablet (81 mg total) by mouth daily.   carvedilol (COREG) 3.125 MG tablet TAKE 1 TABLET(3.125 MG) BY MOUTH TWICE DAILY WITH A MEAL   celecoxib (CELEBREX) 100 MG capsule Take 1 capsule (100 mg total) by mouth 2 (two) times daily as needed for mild pain or moderate pain.   Continuous Blood Gluc Receiver (FREESTYLE LIBRE 2 READER) DEVI 1 Act by Does not apply route daily.   Continuous Blood Gluc Sensor (FREESTYLE LIBRE 2 SENSOR) MISC USE TO CHECK BLOOD SUGAR AND CHANGE EVERY 14 DAYS   FARXIGA 10 MG TABS tablet TAKE 1 TABLET(10 MG) BY MOUTH DAILY BEFORE BREAKFAST   gabapentin (NEURONTIN) 300 MG capsule TAKE 1 CAPSULE(300 MG) BY MOUTH TWICE DAILY   Glucagon (GVOKE HYPOPEN 2-PACK) 1 MG/0.2ML SOAJ Inject 1 Act into the skin daily as needed. (Patient not taking: Reported on 01/31/2022)   HUMALOG KWIKPEN 100 UNIT/ML KwikPen INJECT 10 UNITS UNDER THE SKIN THREE TIMES DAILY AS DIRECTED BY SLIDING SCALE (Patient taking differently: Inject 10-45 Units into the skin 3 (three) times daily. Pt takes with meals)   insulin glargine (LANTUS SOLOSTAR) 100 UNIT/ML Solostar Pen INJECT 55 UNITS UNDER THE SKIN DAILY (Patient taking differently: INJECT 55 UNITS UNDER THE SKIN DAILY PT TAKES IN THE PM)   Insulin Pen Needle 31G X 8 MM MISC 1 each by Does not apply route 4 (four)  times daily.   oxyCODONE-acetaminophen (PERCOCET) 5-325 MG tablet Take 1 tablet by mouth every 6 (six) hours as needed for severe pain.   pantoprazole (PROTONIX) 40 MG tablet TAKE 1 TABLET(40 MG) BY MOUTH TWICE DAILY BEFORE A MEAL   pramipexole (MIRAPEX) 0.125 MG tablet TAKE 1 TABLET(0.125 MG) BY MOUTH AT BEDTIME   rosuvastatin (CRESTOR) 40 MG tablet TAKE 1 TABLET BY MOUTH EVERY DAY   torsemide (DEMADEX) 20 MG tablet TAKE 1 TABLET(20 MG) BY MOUTH 3 TIMES A WEEK (Patient taking differently: TAKE 1  TABLET(20 MG) BY MOUTH 3 TIMES A WEEK Monday, Wednesday and Friday)   warfarin (COUMADIN) 5 MG tablet TAKE 1 TO 2 TABLETS BY MOUTH DAILY AS DIRECTED BY COUMADIN CLINIC   No facility-administered encounter medications on file as of 03/15/2022.    Allergies (verified) Morphine and related and Latex   History: Past Medical History:  Diagnosis Date   AKI (acute kidney injury) ([place])    With STEMI in 5277   Chronic systolic CHF (congestive heart failure) ([place])    Depression    Diabetes mellitus without complication ([name])    Diabetic retinopathy ([name])    GERD (gastroesophageal reflux disease)    Hx of adenomatous colonic polyps 04/07/2018   Hyperlipidemia    Hypertension    Hypertensive retinopathy    Paroxysmal atrial fibrillation ([name])    Peripheral vascular disease ([name])    Pneumonia    Seizures ([name])    hx of as a child   STEMI (ST elevation myocardial infarction) ([place]) 2017   Past Surgical History:  Procedure Laterality Date   ABDOMINAL AORTOGRAM W/LOWER EXTREMITY N/A 09/19/2016   Procedure: Abdominal Aortogram w/Lower Extremity;  Surgeon: Wellington Hampshire, MD;  Location: [facility];  Service: Cardiovascular;  Laterality: N/A;   AMPUTATION Right 03/23/2016   Procedure: 1st and 2nd Ray Amputation Right Foot;  Surgeon: Newt Minion, MD;  Location: [facility];  Service: Orthopedics;  Laterality: Right;   AMPUTATION Right 06/21/2016   Procedure: RIGHT TRANSMETATARSAL AMPUTATION;  Surgeon: Newt Minion, MD;  Location: [facility];  Service: Orthopedics;  Laterality: Right;   CARDIAC CATHETERIZATION N/A 03/13/2016   Procedure: Right/Left Heart Cath and Coronary Angiography;  Surgeon: Sherren Mocha, MD;  Location: [facility];  Service: Cardiovascular;  Laterality: N/A;   CARDIAC CATHETERIZATION N/A 03/13/2016   Procedure: IABP Insertion;  Surgeon: Sherren Mocha, MD;  Location: [facility];  Service: Cardiovascular;  Laterality: N/A;   CATARACT EXTRACTION     CATARACT  EXTRACTION, BILATERAL     CERVICAL FUSION  1982, 1992   has had 3 neck surgeries from breaking his neck   CIRCUMCISION N/A 11/07/2021   Procedure: CIRCUMCISION ADULT;  Surgeon: Vira Agar, MD;  Location: [facility];  Service: Urology;  Laterality: N/A;   CORONARY ARTERY BYPASS GRAFT N/A 03/13/2016   Procedure: CORONARY ARTERY BYPASS GRAFTING (CABG) x 1 (SVG to OM) with EVH from [place];  Surgeon: Ivin Poot, MD;  Location: [facility];  Service: Open Heart Surgery;  Laterality: N/A;   EYE SURGERY     LOWER EXTREMITY ANGIOGRAM  05/02/2016   Procedure: Lower Extremity Angiogram;  Surgeon: Wellington Hampshire, MD;  Location: [facility];  Service: Cardiovascular;;  Limited left femoral runoff right femoral runoff   PERIPHERAL VASCULAR CATHETERIZATION N/A 05/02/2016   Procedure: Abdominal Aortogram;  Surgeon: Wellington Hampshire, MD;  Location: [facility];  Service: Cardiovascular;  Laterality: N/A;  PERIPHERAL VASCULAR CATHETERIZATION Right 05/02/2016   Procedure: Peripheral Vascular Balloon Angioplasty;  Surgeon: Wellington Hampshire, MD;  Location: [facility];  Service: Cardiovascular;  Laterality: Right;  SFA   TEE WITHOUT CARDIOVERSION N/A 03/13/2016   Procedure: TRANSESOPHAGEAL ECHOCARDIOGRAM (TEE);  Surgeon: Ivin Poot, MD;  Location: [facility];  Service: Open Heart Surgery;  Laterality: N/A;   VSD REPAIR N/A 03/13/2016   Procedure: VENTRICULAR SEPTAL DEFECT (VSD) REPAIR;  Surgeon: Ivin Poot, MD;  Location: [facility];  Service: Open Heart Surgery;  Laterality: N/A;   Family History  Problem Relation Age of Onset   Diabetes Maternal Grandmother    Diabetes Mother    Aneurysm Mother    Peripheral Artery Disease Mother    Coronary artery disease Mother    Peptic Ulcer Father    Retinoblastoma Daughter    Colon cancer Neg Hx    Rectal cancer Neg Hx    Social History   Socioeconomic History   Marital status: Married    Spouse name: Lavell Luster    Number of children: Not on file   Years of education: Not on file   Highest education level: Not on file  Occupational History   Not on file  Tobacco Use   Smoking status: Former    Packs/day: 0.50    Types: Cigarettes    Quit date: 11/20/2015    Years since quitting: 6.3   Smokeless tobacco: Never   Tobacco comments:    quit 2018  Vaping Use   Vaping Use: Never used  Substance and Sexual Activity   Alcohol use: No   Drug use: No   Sexual activity: Not Currently  Other Topics Concern   Not on file  Social History Narrative   He is married he is from [place] and 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  He is disabled from a neck fracture in 1994.  1 son and 2 daughters.      He is illiterate.      No alcohol or caffeine or drug use or other tobacco he is a former smoker.   Social Determinants of Health   Financial Resource Strain: Low Risk  (03/15/2022)   Overall Financial Resource Strain (CARDIA)    Difficulty of Paying Living Expenses: Not hard at all  Food Insecurity: No Food Insecurity (03/15/2022)   Hunger Vital Sign    Worried About Running Out of Food in the Last Year: Never true    Ran Out of Food in the Last Year: Never true  Transportation Needs: No Transportation Needs (03/15/2022)   PRAPARE - Hydrologist (Medical): No    Lack of Transportation (Non-Medical): No  Physical Activity: Inactive (03/15/2022)   Exercise Vital Sign    Days of Exercise per Week: 0 days    Minutes of Exercise per Session: 0 min  Stress: No Stress Concern Present (03/15/2022)   [place]    Feeling of Stress : Not at all  Social Connections: [place] (03/15/2022)   Social Connection and Isolation Panel [NHANES]    Frequency of Communication with Friends and Family: More than three times a week    Frequency of Social Gatherings with  Friends and Family: More than three times a week    Attends Religious Services: More than 4 times per year    Active Member of Clubs or Organizations: Yes    Attends  Music therapist: More than 4 times per year    Marital Status: Married    Tobacco Counseling Counseling given: Not Answered Tobacco comments: quit 2018   Clinical Intake:  Pre-visit preparation completed: Yes  Pain : No/denies pain     BMI - recorded: 29.62 Nutritional Status: BMI 25 -29 Overweight Nutritional Risks: None Diabetes: Yes CBG done?: No Did pt. bring in CBG monitor from home?: No  How often do you need to have someone help you when you read instructions, pamphlets, or other written materials from your doctor or pharmacy?: 1 - Never What is the last grade level you completed in school?: 6th grade  Nutrition Risk Assessment:  Has the patient had any N/V/D within the last 2 months?  No  Does the patient have any non-healing wounds?  No  Has the patient had any unintentional weight loss or weight gain?  No   Diabetes:  Is the patient diabetic?  Yes  If diabetic, was a CBG obtained today?  No  Did the patient bring in their glucometer from home?  No  How often do you monitor your CBG's? Continuous Glucose Sensor.   Financial Strains and Diabetes Management:  Are you having any financial strains with the device, your supplies or your medication? No .  Does the patient want to be seen by Chronic Care Management for management of their diabetes?  No  Would the patient like to be referred to a Nutritionist or for Diabetic Management?  No   Diabetic Exams:  Diabetic Eye Exam: Completed 03/01/2022. Overdue for diabetic eye exam. Pt has been advised about the importance in completing this exam. A referral has been placed today. Message sent to referral coordinator for scheduling purposes. Advised pt to expect a call from office referred to regarding appt.  Diabetic Foot Exam: Completed  11/22/2020. Pt has been advised about the importance in completing this exam.   Interpreter Needed?: No  Information entered by :: Lisette Abu, LPN.   Activities of Daily Living    03/15/2022    4:12 PM 11/06/2021    1:43 PM  In your present state of health, do you have any difficulty performing the following activities:  Hearing? 1   Vision? 0   Difficulty concentrating or making decisions? 0   Walking or climbing stairs? 1   Dressing or bathing? 0   Doing errands, shopping? 1 0  Comment does not drive   Preparing Food and eating ? Y   Using the Toilet? N   In the past six months, have you accidently leaked urine? Y   Comment uses Depends   Do you have problems with loss of bowel control? Y   Managing your Medications? Y   Managing your Finances? Y   Housekeeping or managing your Housekeeping? Y     Patient Care Team: [name], MD as PCP - General (Internal Medicine) Wellington Hampshire, MD as PCP - [facility] Cardiology (Cardiology) Wellington Hampshire, MD as PCP - Cardiology (Cardiology) [name], MD as Consulting Physician (Ophthalmology)  Indicate any recent Medical Services you may have received from other than [facility] providers in the past year (date may be approximate).     Assessment:   This is a routine wellness examination for William [name].  Hearing/Vision screen Hearing Screening - Comments:: Patient wears hearing aids. Vision Screening - Comments:: Patient wears readers for small print.  Eye exam done by: [name], MD.  Dietary issues and exercise activities discussed:  Current Exercise Habits: The patient does not participate in regular exercise at present, Exercise limited by: neurologic condition(s);cardiac condition(s);orthopedic condition(s);psychological condition(s)   Goals Addressed             This Visit's Progress    Continue eating healthy per Kidney doctor.        Depression Screen    03/15/2022    3:59 PM 07/10/2021    3:34 PM 10/29/2019     3:14 PM 09/29/2018   10:13 AM 07/28/2018    1:07 PM 06/16/2018    1:41 PM 03/10/2018   11:17 AM  PHQ 2/9 Scores  PHQ - 2 Score 0 0 0 0 0 0 0    Fall Risk    03/15/2022    3:55 PM 11/22/2020    3:27 PM 10/29/2019    3:14 PM 01/05/2019   10:29 AM 09/29/2018   10:13 AM  [place] in the past year? 0 0 0 0 0  Number falls in past yr: 0   0 0  Injury with Fall? 0   0 0  Risk for fall due to : No Fall Risks      Follow up Falls prevention discussed        FALL RISK PREVENTION PERTAINING TO THE HOME:  Any stairs in or around the home? No  If so, are there any without handrails? No  Home free of loose throw rugs in walkways, pet beds, electrical cords, etc? Yes  Adequate lighting in your home to reduce risk of falls? Yes   ASSISTIVE DEVICES UTILIZED TO PREVENT FALLS:  Life alert? No  Use of a cane, walker or w/c? Yes  Grab bars in the bathroom? Yes  Shower chair or bench in shower? Yes  Elevated toilet seat or a handicapped toilet? No   TIMED UP AND GO:  Was the test performed? No . This was a phone visit   Cognitive Function:    03/15/2022    4:14 PM  MMSE - Mini Mental State Exam  Not completed: Refused        Immunizations Immunization History  Administered Date(s) Administered   Influenza,inj,Quad PF,6+ Mos 03/23/2016, 03/20/2017   Influenza-Unspecified 05/30/2021   Pneumococcal Conjugate-13 09/14/2016   Pneumococcal Polysaccharide-23 12/02/2017   Tdap 09/14/2016    TDAP status: Up to date  Flu Vaccine status: Due, Education has been provided regarding the importance of this vaccine. Advised may receive this vaccine at local pharmacy or Health Dept. Aware to provide a copy of the vaccination record if obtained from local pharmacy or Health Dept. Verbalized acceptance and understanding.  Pneumococcal vaccine status: Up to date  Covid-19 vaccine status: Declined, Education has been provided regarding the importance of this vaccine but patient still  declined. Advised may receive this vaccine at local pharmacy or Health Dept.or vaccine clinic. Aware to provide a copy of the vaccination record if obtained from local pharmacy or Health Dept. Verbalized acceptance and understanding.  Qualifies for Shingles Vaccine? Yes   Zostavax completed No   Shingrix Completed?: No.    Education has been provided regarding the importance of this vaccine. Patient has been advised to call insurance company to determine out of pocket expense if they have not yet received this vaccine. Advised may also receive vaccine at local pharmacy or Health Dept. Verbalized acceptance and understanding.  Screening Tests Health Maintenance  Topic Date Due   COVID-19 Vaccine (1) Never done   Zoster Vaccines- Shingrix (1 of 2)  Never done   OPHTHALMOLOGY EXAM  10/13/2021   Diabetic kidney evaluation - Urine ACR  11/22/2021   FOOT EXAM  11/22/2021   COLONOSCOPY (Pts 45-66yr Insurance coverage will need to be confirmed)  10/10/2022 (Originally 04/07/2021)   HEMOGLOBIN A1C  05/09/2022   Diabetic kidney evaluation - GFR measurement  11/07/2022   TETANUS/TDAP  09/15/2026   Pneumonia Vaccine 71 Years old  Completed   Hepatitis C Screening  Completed   HPV VACCINES  Aged Out    Health Maintenance  Health Maintenance Due  Topic Date Due   COVID-19 Vaccine (1) Never done   Zoster Vaccines- Shingrix (1 of 2) Never done   OPHTHALMOLOGY EXAM  10/13/2021   Diabetic kidney evaluation - Urine ACR  11/22/2021   FOOT EXAM  11/22/2021    Colorectal cancer screening: Referral to GI placed 03/15/2022. Pt aware the office will call re: appt.  Lung Cancer Screening: (Low Dose CT Chest recommended if Age 367-80years, 30 pack-year currently smoking OR have quit w/in 15years.) does not qualify.   Lung Cancer Screening Referral: no  Additional Screening:  Hepatitis C Screening: does qualify; Completed 3/16/20188  Vision Screening: Recommended annual ophthalmology exams for early  detection of glaucoma and other disorders of the eye. Is the patient up to date with their annual eye exam?  Yes  Who is the provider or what is the name of the office in which the patient attends annual eye exams? BBernarda Caffey MD. If pt is not established with a provider, would they like to be referred to a provider to establish care? No .   Dental Screening: Recommended annual dental exams for proper oral hygiene  Community Resource Referral / Chronic Care Management: CRR required this visit?  No   CCM required this visit?  No      Plan:     I have personally reviewed and noted the following in the patient's chart:   Medical and social history Use of alcohol, tobacco or illicit drugs  Current medications and supplements including opioid prescriptions. Patient is not currently taking opioid prescriptions. Functional ability and status Nutritional status Physical activity Advanced directives List of other physicians Hospitalizations, surgeries, and ER visits in previous 12 months Vitals Screenings to include cognitive, depression, and falls Referrals and appointments  In addition, I have reviewed and discussed with patient certain preventive protocols, quality metrics, and best practice recommendations. A written personalized care plan for preventive services as well as general preventive health recommendations were provided to patient.     SSheral Flow LPN   91/96/2229  Nurse Notes:  Daughter provided weight for this visit. Patient refused any cognitive testing.

## 2022-03-20 NOTE — Progress Notes (Signed)
[facility] Note  03/29/2022    CHIEF COMPLAINT Patient presents for Retina Follow Up  HISTORY OF PRESENT ILLNESS: William [name] is a 71 y.o. male who presents to the clinic today for:   HPI     Retina Follow Up   Patient presents with  Diabetic Retinopathy.  In both eyes.  This started 4 weeks ago.  I, the attending physician,  performed the HPI with the patient and updated documentation appropriately.        Comments   Patient here for 4 weeks for retina follow up for NPDR OU. Patient states vision doing ok. Has eye pain on occasion.      Last edited by [name], MD on 03/29/2022  8:29 AM.     Referring physician: [name], MD [place],  [place] [address]  HISTORICAL INFORMATION:  Selected notes from the MEDICAL RECORD NUMBER Referred by Dr. [name] for eval of DME OU LEE:  Ocular Hx- PMH-    CURRENT MEDICATIONS: No current outpatient medications on file. (Ophthalmic Drugs)   No current facility-administered medications for this visit. (Ophthalmic Drugs)   Current Outpatient Medications (Other)  Medication Sig   albuterol (PROVENTIL HFA;VENTOLIN HFA) 108 (90 Base) MCG/ACT inhaler Inhale 2 puffs into the lungs every 6 (six) hours as needed for wheezing or shortness of breath.   aspirin EC 81 MG tablet Take 1 tablet (81 mg total) by mouth daily.   carvedilol (COREG) 3.125 MG tablet TAKE 1 TABLET(3.125 MG) BY MOUTH TWICE DAILY WITH A MEAL   celecoxib (CELEBREX) 100 MG capsule Take 1 capsule (100 mg total) by mouth 2 (two) times daily as needed for mild pain or moderate pain.   Continuous Blood Gluc Receiver (FREESTYLE LIBRE 2 READER) DEVI 1 Act by Does not apply route daily.   Continuous Blood Gluc Sensor (FREESTYLE LIBRE 2 SENSOR) MISC USE TO CHECK BLOOD SUGAR AND CHANGE EVERY 14 DAYS   FARXIGA 10 MG TABS tablet TAKE 1 TABLET(10 MG) BY MOUTH DAILY BEFORE BREAKFAST   gabapentin (NEURONTIN) 300 MG capsule TAKE 1  CAPSULE(300 MG) BY MOUTH TWICE DAILY   HUMALOG KWIKPEN 100 UNIT/ML KwikPen INJECT 10 UNITS UNDER THE SKIN THREE TIMES DAILY AS DIRECTED BY SLIDING SCALE (Patient taking differently: Inject 10-45 Units into the skin 3 (three) times daily. Pt takes with meals)   insulin glargine (LANTUS SOLOSTAR) 100 UNIT/ML Solostar Pen INJECT 55 UNITS UNDER THE SKIN DAILY (Patient taking differently: INJECT 55 UNITS UNDER THE SKIN DAILY PT TAKES IN THE PM)   Insulin Pen Needle 31G X 8 MM MISC 1 each by Does not apply route 4 (four) times daily.   oxyCODONE-acetaminophen (PERCOCET) 5-325 MG tablet Take 1 tablet by mouth every 6 (six) hours as needed for severe pain.   pantoprazole (PROTONIX) 40 MG tablet TAKE 1 TABLET(40 MG) BY MOUTH TWICE DAILY BEFORE A MEAL   pramipexole (MIRAPEX) 0.125 MG tablet TAKE 1 TABLET(0.125 MG) BY MOUTH AT BEDTIME   rosuvastatin (CRESTOR) 40 MG tablet TAKE 1 TABLET BY MOUTH EVERY DAY   torsemide (DEMADEX) 20 MG tablet TAKE 1 TABLET(20 MG) BY MOUTH 3 TIMES A WEEK (Patient taking differently: TAKE 1 TABLET(20 MG) BY MOUTH 3 TIMES A WEEK Monday, Wednesday and Friday)   warfarin (COUMADIN) 5 MG tablet TAKE 1 TO 2 TABLETS BY MOUTH DAILY AS DIRECTED BY COUMADIN CLINIC   Glucagon (GVOKE HYPOPEN 2-PACK) 1 MG/0.2ML SOAJ Inject 1 Act into the skin daily  as needed. (Patient not taking: Reported on 01/31/2022)   No current facility-administered medications for this visit. (Other)   REVIEW OF SYSTEMS: ROS   Positive for: Gastrointestinal, Genitourinary, Endocrine, Eyes Negative for: Constitutional, Neurological, Skin, Musculoskeletal, HENT, Cardiovascular, Respiratory, Psychiatric, Allergic/Imm, Heme/Lymph Last edited by [name], COA on 03/29/2022  7:48 AM.     ALLERGIES Allergies  Allergen Reactions   Morphine And Related Shortness Of Breath and Other (See Comments)    UNSPECIFIED REACTION "Pt said it was too much"    Latex Rash   PAST MEDICAL HISTORY Past Medical History:   Diagnosis Date   AKI (acute kidney injury) ([place])    With STEMI in 4196   Chronic systolic CHF (congestive heart failure) ([place])    Depression    Diabetes mellitus without complication ([name])    Diabetic retinopathy ([place])    GERD (gastroesophageal reflux disease)    Hx of adenomatous colonic polyps 04/07/2018   Hyperlipidemia    Hypertension    Hypertensive retinopathy    Paroxysmal atrial fibrillation ([name])    Peripheral vascular disease ([name])    Pneumonia    Seizures ([name])    hx of as a child   STEMI (ST elevation myocardial infarction) ([place]) 2017   Past Surgical History:  Procedure Laterality Date   ABDOMINAL AORTOGRAM W/LOWER EXTREMITY N/A 09/19/2016   Procedure: Abdominal Aortogram w/Lower Extremity;  Surgeon: Wellington Hampshire, MD;  Location: [facility];  Service: Cardiovascular;  Laterality: N/A;   AMPUTATION Right 03/23/2016   Procedure: 1st and 2nd Ray Amputation Right Foot;  Surgeon: Newt Minion, MD;  Location: [facility];  Service: Orthopedics;  Laterality: Right;   AMPUTATION Right 06/21/2016   Procedure: RIGHT TRANSMETATARSAL AMPUTATION;  Surgeon: Newt Minion, MD;  Location: [facility];  Service: Orthopedics;  Laterality: Right;   CARDIAC CATHETERIZATION N/A 03/13/2016   Procedure: Right/Left Heart Cath and Coronary Angiography;  Surgeon: Sherren Mocha, MD;  Location: [facility];  Service: Cardiovascular;  Laterality: N/A;   CARDIAC CATHETERIZATION N/A 03/13/2016   Procedure: IABP Insertion;  Surgeon: Sherren Mocha, MD;  Location: [facility];  Service: Cardiovascular;  Laterality: N/A;   CATARACT EXTRACTION     CATARACT EXTRACTION, BILATERAL     CERVICAL FUSION  1982, 1992   has had 3 neck surgeries from breaking his neck   CIRCUMCISION N/A 11/07/2021   Procedure: CIRCUMCISION ADULT;  Surgeon: Vira Agar, MD;  Location: [facility];  Service: Urology;  Laterality: N/A;   CORONARY ARTERY BYPASS GRAFT N/A 03/13/2016   Procedure: CORONARY ARTERY BYPASS  GRAFTING (CABG) x 1 (SVG to OM) with EVH from [place];  Surgeon: Ivin Poot, MD;  Location: [facility];  Service: Open Heart Surgery;  Laterality: N/A;   EYE SURGERY     LOWER EXTREMITY ANGIOGRAM  05/02/2016   Procedure: Lower Extremity Angiogram;  Surgeon: Wellington Hampshire, MD;  Location: [facility];  Service: Cardiovascular;;  Limited left femoral runoff right femoral runoff   PERIPHERAL VASCULAR CATHETERIZATION N/A 05/02/2016   Procedure: Abdominal Aortogram;  Surgeon: Wellington Hampshire, MD;  Location: [facility];  Service: Cardiovascular;  Laterality: N/A;   PERIPHERAL VASCULAR CATHETERIZATION Right 05/02/2016   Procedure: Peripheral Vascular Balloon Angioplasty;  Surgeon: Wellington Hampshire, MD;  Location: [facility];  Service: Cardiovascular;  Laterality: Right;  SFA   TEE WITHOUT CARDIOVERSION N/A 03/13/2016   Procedure: TRANSESOPHAGEAL ECHOCARDIOGRAM (TEE);  Surgeon: Collier Salina  Prescott Gum, MD;  Location: [facility];  Service: Open Heart Surgery;  Laterality: N/A;   VSD REPAIR N/A 03/13/2016   Procedure: VENTRICULAR SEPTAL DEFECT (VSD) REPAIR;  Surgeon: Ivin Poot, MD;  Location: [facility];  Service: Open Heart Surgery;  Laterality: N/A;   FAMILY HISTORY Family History  Problem Relation Age of Onset   Diabetes Maternal Grandmother    Diabetes Mother    Aneurysm Mother    Peripheral Artery Disease Mother    Coronary artery disease Mother    Peptic Ulcer Father    Retinoblastoma Daughter    Colon cancer Neg Hx    Rectal cancer Neg Hx    SOCIAL HISTORY Social History   Tobacco Use   Smoking status: Former    Packs/day: 0.50    Types: Cigarettes    Quit date: 11/20/2015    Years since quitting: 6.3   Smokeless tobacco: Never   Tobacco comments:    quit 2018  Vaping Use   Vaping Use: Never used  Substance Use Topics   Alcohol use: No   Drug use: No       OPHTHALMIC EXAM: Base Eye Exam     Visual Acuity (Snellen - Linear)       Right  Left   Dist [place] 20/30 20/20 -1   Dist ph [place] 20/25          Tonometry (Tonopen, 7:46 AM)       Right Left   Pressure 10 11         Pupils       Dark Light Shape React APD   Right 2 1 Round Brisk None   Left 2 1 Round Brisk None         Visual Fields (Counting fingers)       Left Right    Full Full         Extraocular Movement       Right Left    Full, Ortho Full, Ortho         Neuro/Psych     Oriented x3: Yes   Mood/Affect: Normal         Dilation     Both eyes: 1.0% Mydriacyl, 2.5% Phenylephrine @ 7:46 AM           Slit Lamp and Fundus Exam     Slit Lamp Exam       Right Left   Lids/Lashes Dermatochalasis - upper lid, mild MGD Dermatochalasis - upper lid, mild MGD   Conjunctiva/Sclera White and quiet White and quiet   Cornea trace PEE trace PEE   Anterior Chamber Deep and quiet Deep and quiet   Iris Round and dilated, No NVI Round and dilated, No NVI   Lens PC IOL in good position PC IOL in good position   Anterior Vitreous Vitreous syneresis, vitreous condensations Vitreous syneresis, Posterior vitreous detachment, vitreous condensations         Fundus Exam       Right Left   Disc Pink and Sharp mild Pallor, Sharp rim, mild tilt   C/D Ratio 0.3 0.3   Macula Flat, Blunted foveal reflex, scatted Microaneurysms/DBH - improved, scattered Cystic changes/edema greatest superior mac -- slightly improved Blunted foveal reflex, central IRF/edema - slightly improved, +MA/DBH -- improved   Vessels attenuated, mild tortuosity attenuated, Tortuous, mild Copper wiring   Periphery Attached, +MA greatest posteriorly Attached, scattered MA greatest posteriorly           IMAGING AND PROCEDURES  Imaging and Procedures for 03/29/2022  OCT, Retina - OU - Both Eyes       Right Eye Quality was good. Central Foveal Thickness: 321. Progression has been stable. Findings include no SRF, abnormal foveal contour, intraretinal hyper-reflective material,  intraretinal fluid (persistent IRF -- slight improvement).   Left Eye Quality was good. Central Foveal Thickness: 261. Progression has improved. Findings include no SRF, abnormal foveal contour, intraretinal hyper-reflective material, intraretinal fluid (Mild interval improvement in cystic changes temporal macula and centrally).   Notes *Images captured and stored on drive  Diagnosis / Impression:  +DME OU OD: persistent IRF -- slight improvement OS: Mild interval improvement in cystic changes temporal macula and centrally  Clinical management:  See below  Abbreviations: NFP - Normal foveal profile. CME - cystoid macular edema. PED - pigment epithelial detachment. IRF - intraretinal fluid. SRF - subretinal fluid. EZ - ellipsoid zone. ERM - epiretinal membrane. ORA - outer retinal atrophy. ORT - outer retinal tubulation. SRHM - subretinal hyper-reflective material. IRHM - intraretinal hyper-reflective material      Intravitreal Injection, Pharmacologic Agent - OD - Right Eye       Time Out 03/29/2022. 8:20 AM. Confirmed correct patient, procedure, site, and patient consented.   Anesthesia Topical anesthesia was used. Anesthetic medications included Lidocaine 2%, Proparacaine 0.5%.   Procedure Preparation included 5% betadine to ocular surface, eyelid speculum. A (32g) needle was used.   Injection: 2 mg aflibercept 2 MG/0.05ML   Route: Intravitreal, Site: Right Eye   NDC: A3590391, Lot: [id number], Expiration date: 04/24/2022, Waste: 0 mL   Post-op Post injection exam found visual acuity of at least counting fingers. The patient tolerated the procedure well. There were no complications. The patient received written and verbal post procedure care education. Post injection medications were not given.      Intravitreal Injection, Pharmacologic Agent - OS - Left Eye       Time Out 03/29/2022. 8:20 AM. Confirmed correct patient, procedure, site, and patient consented.    Anesthesia Topical anesthesia was used. Anesthetic medications included Lidocaine 2%, Proparacaine 0.5%.   Procedure Preparation included 5% betadine to ocular surface, eyelid speculum. A (32g) needle was used.   Injection: 2 mg aflibercept 2 MG/0.05ML   Route: Intravitreal, Site: Left Eye   NDC: A3590391, Lot: [id number], Expiration date: 11/30/2022, Waste: 0 mL   Post-op Post injection exam found visual acuity of at least counting fingers. The patient tolerated the procedure well. There were no complications. The patient received written and verbal post procedure care education. Post injection medications were not given.            ASSESSMENT/PLAN:    ICD-10-CM   1. Moderate nonproliferative diabetic retinopathy of both eyes with macular edema associated with type 2 diabetes mellitus ([name])  E11.3313 OCT, Retina - OU - Both Eyes    Intravitreal Injection, Pharmacologic Agent - OD - Right Eye    Intravitreal Injection, Pharmacologic Agent - OS - Left Eye    aflibercept (EYLEA) SOLN 2 mg    aflibercept (EYLEA) SOLN 2 mg    2. Essential hypertension  I10     3. Hypertensive retinopathy of both eyes  H35.033     4. Pseudophakia, both eyes  Z96.1      1. Moderate Non-proliferative diabetic retinopathy, both eyes  - s/p IVA OD #1 (11.14.22), #2 (12.12.22), #3 (01.09.23) -- IVA resistance - s/p IVA OS #1 (10.17.22), #2 (11.14.22), #3 (12.12.22), #4 (01.09.23), #5 (03.15.23), #6 (04.12.23) -- IVA  resistance - s/p IVE OD #1 (03.15.23), #2 (04.12.23), #3 (05.10.23), #4 (06.07.23), #5 (07.05.23), #6 (08.02.23), #7 (08.31.23) - s/p IVE OS #1 (05.10.23), #2 (06.07.23), #3 (07.05.23), #4 (08.02.23), #5 (08.31.23) - exam shows scattered MA, DBH OU - FA (10.17.22) shows late leaking MA OU, no NV OU - OCT shows OD: persistent IRF -- slight improvement; OS: Mild interval improvement in cystic changes temporal macula and centrally at 4 weeks - discussed possibility of elevated BP  blunting efficacy of IVE - BCVA stable at 20/25 OD, 20/20 OS -- improved OS - recommend IVE OD #8 and IVE OS #6 today, 09.28.23 for DME with follow up extended to 4-5 weeks - pt wishes to proceed - RBA of procedure discussed, questions answered - IVE informed consent obtained and signed, 03.15.23 (OD) - IVE informed consent obtained and signed, 05.10.23 (OS) - see procedure note - f/u in 4-5 wks -- DFE/OCT, possible injection(s)  2,3. Hypertensive retinopathy OU - discussed importance of tight BP control and possibility of elevated BP blunting efficacy of IVE - monitor  4. Pseudophakia OU  - s/p CE/IOL  - IOL in good position, doing well  - monitor  Ophthalmic Meds Ordered this visit:  Meds ordered this encounter  Medications   aflibercept (EYLEA) SOLN 2 mg   aflibercept (EYLEA) SOLN 2 mg     Return for f/u 4-5 weeks, NPDR OU, DFE, OCT.  There are no Patient Instructions on file for this visit.  Explained the diagnoses, plan, and follow up with the patient and they expressed understanding.  Patient expressed understanding of the importance of proper follow up care.   This document serves as a record of services personally performed by Gardiner Sleeper, MD, PhD. It was created on their behalf by San Jetty. Owens Shark, OA an ophthalmic technician. The creation of this record is the provider's dictation and/or activities during the visit.    Electronically signed by: San Jetty. Owens Shark, [place] 09.19.2023 12:07 AM  Gardiner Sleeper, M.D., Ph.D. Diseases & Surgery of the Retina and Vitreous [facility]  I have reviewed the above documentation for accuracy and completeness, and I agree with the above. Gardiner Sleeper, M.D., Ph.D. 03/30/22 12:10 AM   Abbreviations: M myopia (nearsighted); A astigmatism; H hyperopia (farsighted); P presbyopia; Mrx spectacle prescription;  CTL contact lenses; OD right eye; OS left eye; OU both eyes  XT exotropia; ET esotropia; PEK punctate  epithelial keratitis; PEE punctate epithelial erosions; DES dry eye syndrome; MGD meibomian gland dysfunction; ATs artificial tears; PFAT's preservative free artificial tears; [place] nuclear sclerotic cataract; PSC posterior subcapsular cataract; ERM epi-retinal membrane; PVD posterior vitreous detachment; RD retinal detachment; DM diabetes mellitus; DR diabetic retinopathy; NPDR non-proliferative diabetic retinopathy; PDR proliferative diabetic retinopathy; CSME clinically significant macular edema; DME diabetic macular edema; dbh dot blot hemorrhages; CWS cotton wool spot; POAG primary open angle glaucoma; C/D cup-to-disc ratio; HVF humphrey visual field; GVF goldmann visual field; OCT optical coherence tomography; IOP intraocular pressure; BRVO Branch retinal vein occlusion; CRVO central retinal vein occlusion; CRAO central retinal artery occlusion; BRAO branch retinal artery occlusion; RT retinal tear; SB scleral buckle; PPV pars plana vitrectomy; VH Vitreous hemorrhage; PRP panretinal laser photocoagulation; IVK intravitreal kenalog; VMT vitreomacular traction; MH Macular hole;  NVD neovascularization of the disc; NVE neovascularization elsewhere; AREDS age related eye disease study; ARMD age related macular degeneration; POAG primary open angle glaucoma; EBMD epithelial/anterior basement membrane dystrophy; ACIOL anterior chamber intraocular lens; IOL intraocular lens; PCIOL posterior  chamber intraocular lens; Phaco/IOL phacoemulsification with intraocular lens placement; [place] photorefractive keratectomy; LASIK laser assisted in situ keratomileusis; HTN hypertension; DM diabetes mellitus; COPD chronic obstructive pulmonary disease

## 2022-03-29 ENCOUNTER — Ambulatory Visit (INDEPENDENT_AMBULATORY_CARE_PROVIDER_SITE_OTHER): Payer: Medicare Other | Admitting: Ophthalmology

## 2022-03-29 ENCOUNTER — Encounter (INDEPENDENT_AMBULATORY_CARE_PROVIDER_SITE_OTHER): Payer: Self-pay | Admitting: Ophthalmology

## 2022-03-29 DIAGNOSIS — I10 Essential (primary) hypertension: Secondary | ICD-10-CM | POA: Diagnosis not present

## 2022-03-29 DIAGNOSIS — H35033 Hypertensive retinopathy, bilateral: Secondary | ICD-10-CM | POA: Diagnosis not present

## 2022-03-29 DIAGNOSIS — E113313 Type 2 diabetes mellitus with moderate nonproliferative diabetic retinopathy with macular edema, bilateral: Secondary | ICD-10-CM

## 2022-03-29 DIAGNOSIS — Z961 Presence of intraocular lens: Secondary | ICD-10-CM

## 2022-03-29 MED ORDER — AFLIBERCEPT 2MG/0.05ML IZ SOLN FOR KALEIDOSCOPE
2.0000 mg | INTRAVITREAL | Status: AC | PRN
  Administered 2022-03-29: 2 mg via INTRAVITREAL

## 2022-04-02 ENCOUNTER — Ambulatory Visit: Payer: Medicare Other | Attending: Cardiovascular Disease | Admitting: *Deleted

## 2022-04-02 DIAGNOSIS — I480 Paroxysmal atrial fibrillation: Secondary | ICD-10-CM | POA: Diagnosis not present

## 2022-04-02 DIAGNOSIS — Z5181 Encounter for therapeutic drug level monitoring: Secondary | ICD-10-CM

## 2022-04-02 LAB — POCT INR: INR: 2.2 (ref 2.0–3.0)

## 2022-04-02 NOTE — Patient Instructions (Signed)
Description   Continue taking 1.5 tablets daily except for 1 tablet every Monday, Wednesday and Friday. Recheck INR in 6 weeks. Anticoagulation Clinic [phone]

## 2022-04-09 DIAGNOSIS — Z961 Presence of intraocular lens: Secondary | ICD-10-CM | POA: Diagnosis not present

## 2022-04-09 DIAGNOSIS — E113211 Type 2 diabetes mellitus with mild nonproliferative diabetic retinopathy with macular edema, right eye: Secondary | ICD-10-CM | POA: Diagnosis not present

## 2022-04-09 DIAGNOSIS — E113292 Type 2 diabetes mellitus with mild nonproliferative diabetic retinopathy without macular edema, left eye: Secondary | ICD-10-CM | POA: Diagnosis not present

## 2022-04-09 DIAGNOSIS — H26492 Other secondary cataract, left eye: Secondary | ICD-10-CM | POA: Diagnosis not present

## 2022-04-16 ENCOUNTER — Ambulatory Visit: Payer: Medicare Other | Admitting: Internal Medicine

## 2022-04-20 ENCOUNTER — Other Ambulatory Visit: Payer: Self-pay | Admitting: Internal Medicine

## 2022-04-20 DIAGNOSIS — I5022 Chronic systolic (congestive) heart failure: Secondary | ICD-10-CM

## 2022-04-26 NOTE — Progress Notes (Signed)
[facility] Note  04/30/2022    CHIEF COMPLAINT Patient presents for Retina Follow Up  HISTORY OF PRESENT ILLNESS: William [name] is a 71 y.o. male who presents to the clinic today for:   HPI     Retina Follow Up   Patient presents with  Diabetic Retinopathy.  In both eyes.  This started 4 weeks ago.  Duration of 4.  Since onset it is stable.  I, the attending physician,  performed the HPI with the patient and updated documentation appropriately.        Comments   4.5 week NPDR OU and IVE OU pt states no vision changes noticed BS 177 this am last A1C unknown       Last edited by [name], MD on 04/30/2022  8:38 AM.    Pt states no change in vision, he states his blood sugar was 177 this morning  Referring physician: [name], MD [place],  [place] [address]  HISTORICAL INFORMATION:  Selected notes from the MEDICAL RECORD NUMBER Referred by Dr. [name] for eval of DME OU LEE:  Ocular Hx- PMH-    CURRENT MEDICATIONS: No current outpatient medications on file. (Ophthalmic Drugs)   No current facility-administered medications for this visit. (Ophthalmic Drugs)   Current Outpatient Medications (Other)  Medication Sig   albuterol (PROVENTIL HFA;VENTOLIN HFA) 108 (90 Base) MCG/ACT inhaler Inhale 2 puffs into the lungs every 6 (six) hours as needed for wheezing or shortness of breath.   aspirin EC 81 MG tablet Take 1 tablet (81 mg total) by mouth daily.   carvedilol (COREG) 3.125 MG tablet TAKE 1 TABLET(3.125 MG) BY MOUTH TWICE DAILY WITH A MEAL   celecoxib (CELEBREX) 100 MG capsule Take 1 capsule (100 mg total) by mouth 2 (two) times daily as needed for mild pain or moderate pain.   Continuous Blood Gluc Receiver (FREESTYLE LIBRE 2 READER) DEVI 1 Act by Does not apply route daily.   Continuous Blood Gluc Sensor (FREESTYLE LIBRE 2 SENSOR) MISC USE TO CHECK BLOOD SUGAR AND CHANGE EVERY 14 DAYS   FARXIGA 10 MG TABS tablet  TAKE 1 TABLET(10 MG) BY MOUTH DAILY BEFORE BREAKFAST   gabapentin (NEURONTIN) 300 MG capsule TAKE 1 CAPSULE(300 MG) BY MOUTH TWICE DAILY   Glucagon (GVOKE HYPOPEN 2-PACK) 1 MG/0.2ML SOAJ Inject 1 Act into the skin daily as needed. (Patient not taking: Reported on 01/31/2022)   HUMALOG KWIKPEN 100 UNIT/ML KwikPen INJECT 10 UNITS UNDER THE SKIN THREE TIMES DAILY AS DIRECTED BY SLIDING SCALE (Patient taking differently: Inject 10-45 Units into the skin 3 (three) times daily. Pt takes with meals)   insulin glargine (LANTUS SOLOSTAR) 100 UNIT/ML Solostar Pen INJECT 55 UNITS UNDER THE SKIN DAILY (Patient taking differently: INJECT 55 UNITS UNDER THE SKIN DAILY PT TAKES IN THE PM)   Insulin Pen Needle 31G X 8 MM MISC 1 each by Does not apply route 4 (four) times daily.   oxyCODONE-acetaminophen (PERCOCET) 5-325 MG tablet Take 1 tablet by mouth every 6 (six) hours as needed for severe pain.   pantoprazole (PROTONIX) 40 MG tablet TAKE 1 TABLET(40 MG) BY MOUTH TWICE DAILY BEFORE A MEAL   pramipexole (MIRAPEX) 0.125 MG tablet TAKE 1 TABLET(0.125 MG) BY MOUTH AT BEDTIME   rosuvastatin (CRESTOR) 40 MG tablet TAKE 1 TABLET BY MOUTH EVERY DAY   torsemide (DEMADEX) 20 MG tablet TAKE 1 TABLET(20 MG) BY MOUTH 3 TIMES A WEEK Monday, Wednesday and Friday  warfarin (COUMADIN) 5 MG tablet TAKE 1 TO 2 TABLETS BY MOUTH DAILY AS DIRECTED BY COUMADIN CLINIC   No current facility-administered medications for this visit. (Other)   REVIEW OF SYSTEMS: ROS   Positive for: Endocrine, Eyes Negative for: Constitutional, Gastrointestinal, Neurological, Skin, Genitourinary, Musculoskeletal, HENT, Cardiovascular, Respiratory, Psychiatric, Allergic/Imm, Heme/Lymph Last edited by [name], MD on 04/30/2022  8:21 AM.     ALLERGIES Allergies  Allergen Reactions   Morphine And Related Shortness Of Breath and Other (See Comments)    UNSPECIFIED REACTION "Pt said it was too much"    Latex Rash   PAST MEDICAL HISTORY Past  Medical History:  Diagnosis Date   AKI (acute kidney injury) ([place])    With STEMI in 9935   Chronic systolic CHF (congestive heart failure) ([place])    Depression    Diabetes mellitus without complication ([name])    Diabetic retinopathy ([place])    GERD (gastroesophageal reflux disease)    Hx of adenomatous colonic polyps 04/07/2018   Hyperlipidemia    Hypertension    Hypertensive retinopathy    Paroxysmal atrial fibrillation ([name])    Peripheral vascular disease ([place])    Pneumonia    Seizures ([name])    hx of as a child   STEMI (ST elevation myocardial infarction) ([place]) 2017   Past Surgical History:  Procedure Laterality Date   ABDOMINAL AORTOGRAM W/LOWER EXTREMITY N/A 09/19/2016   Procedure: Abdominal Aortogram w/Lower Extremity;  Surgeon: Wellington Hampshire, MD;  Location: [facility];  Service: Cardiovascular;  Laterality: N/A;   AMPUTATION Right 03/23/2016   Procedure: 1st and 2nd Ray Amputation Right Foot;  Surgeon: Newt Minion, MD;  Location: [facility];  Service: Orthopedics;  Laterality: Right;   AMPUTATION Right 06/21/2016   Procedure: RIGHT TRANSMETATARSAL AMPUTATION;  Surgeon: Newt Minion, MD;  Location: [facility];  Service: Orthopedics;  Laterality: Right;   CARDIAC CATHETERIZATION N/A 03/13/2016   Procedure: Right/Left Heart Cath and Coronary Angiography;  Surgeon: Sherren Mocha, MD;  Location: [facility];  Service: Cardiovascular;  Laterality: N/A;   CARDIAC CATHETERIZATION N/A 03/13/2016   Procedure: IABP Insertion;  Surgeon: Sherren Mocha, MD;  Location: [facility];  Service: Cardiovascular;  Laterality: N/A;   CATARACT EXTRACTION     CATARACT EXTRACTION, BILATERAL     CERVICAL FUSION  1982, 1992   has had 3 neck surgeries from breaking his neck   CIRCUMCISION N/A 11/07/2021   Procedure: CIRCUMCISION ADULT;  Surgeon: Vira Agar, MD;  Location: [facility];  Service: Urology;  Laterality: N/A;   CORONARY ARTERY BYPASS GRAFT N/A 03/13/2016   Procedure:  CORONARY ARTERY BYPASS GRAFTING (CABG) x 1 (SVG to OM) with EVH from [place];  Surgeon: Ivin Poot, MD;  Location: [facility];  Service: Open Heart Surgery;  Laterality: N/A;   EYE SURGERY     LOWER EXTREMITY ANGIOGRAM  05/02/2016   Procedure: Lower Extremity Angiogram;  Surgeon: Wellington Hampshire, MD;  Location: [facility];  Service: Cardiovascular;;  Limited left femoral runoff right femoral runoff   PERIPHERAL VASCULAR CATHETERIZATION N/A 05/02/2016   Procedure: Abdominal Aortogram;  Surgeon: Wellington Hampshire, MD;  Location: [facility];  Service: Cardiovascular;  Laterality: N/A;   PERIPHERAL VASCULAR CATHETERIZATION Right 05/02/2016   Procedure: Peripheral Vascular Balloon Angioplasty;  Surgeon: Wellington Hampshire, MD;  Location: [facility];  Service: Cardiovascular;  Laterality: Right;  SFA   TEE WITHOUT CARDIOVERSION N/A 03/13/2016  Procedure: TRANSESOPHAGEAL ECHOCARDIOGRAM (TEE);  Surgeon: Ivin Poot, MD;  Location: [facility];  Service: Open Heart Surgery;  Laterality: N/A;   VSD REPAIR N/A 03/13/2016   Procedure: VENTRICULAR SEPTAL DEFECT (VSD) REPAIR;  Surgeon: Ivin Poot, MD;  Location: [facility];  Service: Open Heart Surgery;  Laterality: N/A;   FAMILY HISTORY Family History  Problem Relation Age of Onset   Diabetes Maternal Grandmother    Diabetes Mother    Aneurysm Mother    Peripheral Artery Disease Mother    Coronary artery disease Mother    Peptic Ulcer Father    Retinoblastoma Daughter    Colon cancer Neg Hx    Rectal cancer Neg Hx    SOCIAL HISTORY Social History   Tobacco Use   Smoking status: Former    Packs/day: 0.50    Types: Cigarettes    Quit date: 11/20/2015    Years since quitting: 6.4   Smokeless tobacco: Never   Tobacco comments:    quit 2018  Vaping Use   Vaping Use: Never used  Substance Use Topics   Alcohol use: No   Drug use: No       OPHTHALMIC EXAM: Base Eye Exam     Visual Acuity (Snellen -  Linear)       Right Left   Dist [place] 20/25 -1 20/25   Dist ph [place] NI NI         Tonometry (Tonopen, 8:06 AM)       Right Left   Pressure 11 12         Pupils       Pupils Dark Light Shape React APD   Right PERRL 2 1 Round Brisk None   Left PERRL 2 1 Round Brisk None         Visual Fields       Left Right    Full Full         Extraocular Movement       Right Left    Full, Ortho Full, Ortho         Neuro/Psych     Oriented x3: Yes   Mood/Affect: Normal         Dilation     Both eyes: 2.5% Phenylephrine @ 8:07 AM           Slit Lamp and Fundus Exam     Slit Lamp Exam       Right Left   Lids/Lashes Dermatochalasis - upper lid, mild MGD Dermatochalasis - upper lid, mild MGD   Conjunctiva/Sclera White and quiet White and quiet   Cornea trace PEE trace PEE   Anterior Chamber Deep and quiet Deep and quiet   Iris Round and dilated, No NVI Round and dilated, No NVI   Lens PC IOL in good position PC IOL in good position   Anterior Vitreous Vitreous syneresis, vitreous condensations Vitreous syneresis, Posterior vitreous detachment, vitreous condensations         Fundus Exam       Right Left   Disc Pink and Sharp mild Pallor, Sharp rim, mild tilt   C/D Ratio 0.3 0.3   Macula Flat, Blunted foveal reflex, scatted Microaneurysms/DBH - improved, scattered Cystic changes/edema greatest superior mac -- slightly improved Blunted foveal reflex, central IRF/edema - slightly improved, +MA/DBH -- improved   Vessels attenuated, mild tortuosity attenuated, Tortuous, mild Copper wiring   Periphery Attached, +MA greatest posteriorly Attached, scattered MA greatest posteriorly  IMAGING AND PROCEDURES  Imaging and Procedures for 04/30/2022  OCT, Retina - OU - Both Eyes       Right Eye Quality was good. Central Foveal Thickness: 305. Progression has improved. Findings include no SRF, abnormal foveal contour, intraretinal hyper-reflective  material, intraretinal fluid, vitreomacular adhesion (persistent IRF -- slight improvement).   Left Eye Quality was good. Central Foveal Thickness: 255. Progression has improved. Findings include no SRF, abnormal foveal contour, intraretinal hyper-reflective material, intraretinal fluid (Mild interval improvement in cystic changes temporal macula and centrally).   Notes *Images captured and stored on drive  Diagnosis / Impression:  +DME OU OD: persistent IRF -- slight improvement OS: Mild interval improvement in cystic changes temporal macula and centrally  Clinical management:  See below  Abbreviations: NFP - Normal foveal profile. CME - cystoid macular edema. PED - pigment epithelial detachment. IRF - intraretinal fluid. SRF - subretinal fluid. EZ - ellipsoid zone. ERM - epiretinal membrane. ORA - outer retinal atrophy. ORT - outer retinal tubulation. SRHM - subretinal hyper-reflective material. IRHM - intraretinal hyper-reflective material      Intravitreal Injection, Pharmacologic Agent - OD - Right Eye       Time Out 04/30/2022. 8:05 AM. Confirmed correct patient, procedure, site, and patient consented.   Anesthesia Topical anesthesia was used. Anesthetic medications included Lidocaine 2%, Proparacaine 0.5%.   Procedure Preparation included 5% betadine to ocular surface, eyelid speculum. A (32g) needle was used.   Injection: 2 mg aflibercept 2 MG/0.05ML   Route: Intravitreal, Site: Right Eye   NDC: A3590391, Lot: [id number], Expiration date: 08/02/2023, Waste: 0 mL   Post-op Post injection exam found visual acuity of at least counting fingers. The patient tolerated the procedure well. There were no complications. The patient received written and verbal post procedure care education. Post injection medications were not given.      Intravitreal Injection, Pharmacologic Agent - OS - Left Eye       Time Out 04/30/2022. 8:05 AM. Confirmed correct patient, procedure,  site, and patient consented.   Anesthesia Topical anesthesia was used. Anesthetic medications included Lidocaine 2%, Proparacaine 0.5%.   Procedure Preparation included 5% betadine to ocular surface, eyelid speculum. A (32g) needle was used.   Injection: 2 mg aflibercept 2 MG/0.05ML   Route: Intravitreal, Site: Left Eye   NDC: A3590391, Lot: [id number], Expiration date: 08/02/2023, Waste: 0 mL   Post-op Post injection exam found visual acuity of at least counting fingers. The patient tolerated the procedure well. There were no complications. The patient received written and verbal post procedure care education. Post injection medications were not given.            ASSESSMENT/PLAN:    ICD-10-CM   1. Moderate nonproliferative diabetic retinopathy of both eyes with macular edema associated with type 2 diabetes mellitus ([name])  E11.3313 OCT, Retina - OU - Both Eyes    Intravitreal Injection, Pharmacologic Agent - OD - Right Eye    Intravitreal Injection, Pharmacologic Agent - OS - Left Eye    aflibercept (EYLEA) SOLN 2 mg    aflibercept (EYLEA) SOLN 2 mg    2. Essential hypertension  I10     3. Hypertensive retinopathy of both eyes  H35.033     4. Pseudophakia, both eyes  Z96.1      1. Moderate Non-proliferative diabetic retinopathy, both eyes  - s/p IVA OD #1 (11.14.22), #2 (12.12.22), #3 (01.09.23) -- IVA resistance - s/p IVA OS #1 (10.17.22), #2 (11.14.22), #3 (12.12.22), #4 (01.09.23), #5 (  03.15.23), #6 (04.12.23) -- IVA resistance - s/p IVE OD #1 (03.15.23), #2 (04.12.23), #3 (05.10.23), #4 (06.07.23), #5 (07.05.23), #6 (08.02.23), #7 (08.31.23), #8 (09.28.23) - s/p IVE OS #1 (05.10.23), #2 (06.07.23), #3 (07.05.23), #4 (08.02.23), #5 (08.31.23), #6 (09.28.23) - exam shows scattered MA, DBH OU - FA (10.17.22) shows late leaking MA OU, no NV OU - OCT shows OD: persistent IRF -- slight improvement; OS: Mild interval improvement in cystic changes temporal macula and  centrally at 4.5 weeks - discussed possibility of elevated BP blunting efficacy of IVE - BCVA stable at 20/25 OU - recommend IVE OD #9 and IVE OS #7 today, 10.30.23 for DME with follow up in 4-5 weeks - pt wishes to proceed - RBA of procedure discussed, questions answered - IVE informed consent obtained and signed, 03.15.23 (OD) - IVE informed consent obtained and signed, 05.10.23 (OS) - see procedure note - f/u in 4-5 wks -- DFE/OCT, possible injection(s)  2,3. Hypertensive retinopathy OU - discussed importance of tight BP control and possibility of elevated BP blunting efficacy of IVE - monitor  4. Pseudophakia OU  - s/p CE/IOL  - IOL in good position, doing well  - monitor  Ophthalmic Meds Ordered this visit:  Meds ordered this encounter  Medications   aflibercept (EYLEA) SOLN 2 mg   aflibercept (EYLEA) SOLN 2 mg     Return for f/u 4-5 weeks, NPDR OU, DFE, OCT.  There are no Patient Instructions on file for this visit.  Explained the diagnoses, plan, and follow up with the patient and they expressed understanding.  Patient expressed understanding of the importance of proper follow up care.   This document serves as a record of services personally performed by Gardiner Sleeper, MD, PhD. It was created on their behalf by San Jetty. Owens Shark, OA an ophthalmic technician. The creation of this record is the provider's dictation and/or activities during the visit.    Electronically signed by: San Jetty. Owens Shark, [place] 10.26.2023 8:39 AM   Gardiner Sleeper, M.D., Ph.D. Diseases & Surgery of the Retina and Vitreous [facility]  I have reviewed the above documentation for accuracy and completeness, and I agree with the above. Gardiner Sleeper, M.D., Ph.D. 04/30/22 8:39 AM  Abbreviations: M myopia (nearsighted); A astigmatism; H hyperopia (farsighted); P presbyopia; Mrx spectacle prescription;  CTL contact lenses; OD right eye; OS left eye; OU both eyes  XT exotropia;  ET esotropia; PEK punctate epithelial keratitis; PEE punctate epithelial erosions; DES dry eye syndrome; MGD meibomian gland dysfunction; ATs artificial tears; PFAT's preservative free artificial tears; [place] nuclear sclerotic cataract; PSC posterior subcapsular cataract; ERM epi-retinal membrane; PVD posterior vitreous detachment; RD retinal detachment; DM diabetes mellitus; DR diabetic retinopathy; NPDR non-proliferative diabetic retinopathy; PDR proliferative diabetic retinopathy; CSME clinically significant macular edema; DME diabetic macular edema; dbh dot blot hemorrhages; CWS cotton wool spot; POAG primary open angle glaucoma; C/D cup-to-disc ratio; HVF humphrey visual field; GVF goldmann visual field; OCT optical coherence tomography; IOP intraocular pressure; BRVO Branch retinal vein occlusion; CRVO central retinal vein occlusion; CRAO central retinal artery occlusion; BRAO branch retinal artery occlusion; RT retinal tear; SB scleral buckle; PPV pars plana vitrectomy; VH Vitreous hemorrhage; PRP panretinal laser photocoagulation; IVK intravitreal kenalog; VMT vitreomacular traction; MH Macular hole;  NVD neovascularization of the disc; NVE neovascularization elsewhere; AREDS age related eye disease study; ARMD age related macular degeneration; POAG primary open angle glaucoma; EBMD epithelial/anterior basement membrane dystrophy; ACIOL anterior chamber intraocular lens; IOL intraocular  lens; PCIOL posterior chamber intraocular lens; Phaco/IOL phacoemulsification with intraocular lens placement; PRK photorefractive keratectomy; LASIK laser assisted in situ keratomileusis; HTN hypertension; DM diabetes mellitus; COPD chronic obstructive pulmonary disease 

## 2022-04-30 ENCOUNTER — Other Ambulatory Visit: Payer: Self-pay | Admitting: Internal Medicine

## 2022-04-30 ENCOUNTER — Ambulatory Visit (INDEPENDENT_AMBULATORY_CARE_PROVIDER_SITE_OTHER): Payer: Medicare Other | Admitting: Ophthalmology

## 2022-04-30 ENCOUNTER — Encounter (INDEPENDENT_AMBULATORY_CARE_PROVIDER_SITE_OTHER): Payer: Self-pay | Admitting: Ophthalmology

## 2022-04-30 ENCOUNTER — Telehealth: Payer: Self-pay | Admitting: Internal Medicine

## 2022-04-30 DIAGNOSIS — Z961 Presence of intraocular lens: Secondary | ICD-10-CM | POA: Diagnosis not present

## 2022-04-30 DIAGNOSIS — I10 Essential (primary) hypertension: Secondary | ICD-10-CM | POA: Diagnosis not present

## 2022-04-30 DIAGNOSIS — I5022 Chronic systolic (congestive) heart failure: Secondary | ICD-10-CM

## 2022-04-30 DIAGNOSIS — E113313 Type 2 diabetes mellitus with moderate nonproliferative diabetic retinopathy with macular edema, bilateral: Secondary | ICD-10-CM

## 2022-04-30 DIAGNOSIS — H35033 Hypertensive retinopathy, bilateral: Secondary | ICD-10-CM

## 2022-04-30 LAB — HM DIABETES EYE EXAM

## 2022-04-30 MED ORDER — AFLIBERCEPT 2MG/0.05ML IZ SOLN FOR KALEIDOSCOPE
2.0000 mg | INTRAVITREAL | Status: AC | PRN
  Administered 2022-04-30: 2 mg via INTRAVITREAL

## 2022-04-30 NOTE — Telephone Encounter (Signed)
Caller & Relationship to patient: Daughter, Con Memos  Call Back Number: [phone]  Date of Last Office Visit:  10/09/21  Date of Next Office Visit:  05/07/22  Medication(s) to be Refilled:TORSEMIDE  Preferred Pharmacy: [id number] [address], [address] AT [facility] [address]  [place], [place] [address]  Phone:  [phone]  Fax:  [phone]   ** Please notify patient to allow 48-72 hours to process** **Let patient know to contact pharmacy at the end of the day to make sure medication is ready. ** **If patient has not been seen in a year or longer, book an appointment **Advise to use MyChart for refill requests OR to contact their pharmacy

## 2022-05-03 DIAGNOSIS — N1832 Chronic kidney disease, stage 3b: Secondary | ICD-10-CM | POA: Diagnosis not present

## 2022-05-07 ENCOUNTER — Encounter: Payer: Self-pay | Admitting: Internal Medicine

## 2022-05-07 ENCOUNTER — Ambulatory Visit (INDEPENDENT_AMBULATORY_CARE_PROVIDER_SITE_OTHER): Payer: Medicare Other | Admitting: Internal Medicine

## 2022-05-07 VITALS — BP 128/74 | HR 68 | Temp 97.9°F | Ht 65.0 in | Wt 179.0 lb

## 2022-05-07 DIAGNOSIS — E785 Hyperlipidemia, unspecified: Secondary | ICD-10-CM

## 2022-05-07 DIAGNOSIS — E118 Type 2 diabetes mellitus with unspecified complications: Secondary | ICD-10-CM | POA: Diagnosis not present

## 2022-05-07 DIAGNOSIS — E119 Type 2 diabetes mellitus without complications: Secondary | ICD-10-CM

## 2022-05-07 DIAGNOSIS — N1832 Chronic kidney disease, stage 3b: Secondary | ICD-10-CM

## 2022-05-07 DIAGNOSIS — I480 Paroxysmal atrial fibrillation: Secondary | ICD-10-CM

## 2022-05-07 DIAGNOSIS — Z0001 Encounter for general adult medical examination with abnormal findings: Secondary | ICD-10-CM

## 2022-05-07 DIAGNOSIS — E1122 Type 2 diabetes mellitus with diabetic chronic kidney disease: Secondary | ICD-10-CM

## 2022-05-07 DIAGNOSIS — Z23 Encounter for immunization: Secondary | ICD-10-CM | POA: Diagnosis not present

## 2022-05-07 DIAGNOSIS — Z794 Long term (current) use of insulin: Secondary | ICD-10-CM

## 2022-05-07 DIAGNOSIS — Z125 Encounter for screening for malignant neoplasm of prostate: Secondary | ICD-10-CM | POA: Diagnosis not present

## 2022-05-07 DIAGNOSIS — D696 Thrombocytopenia, unspecified: Secondary | ICD-10-CM | POA: Diagnosis not present

## 2022-05-07 DIAGNOSIS — E113293 Type 2 diabetes mellitus with mild nonproliferative diabetic retinopathy without macular edema, bilateral: Secondary | ICD-10-CM | POA: Diagnosis not present

## 2022-05-07 DIAGNOSIS — I5022 Chronic systolic (congestive) heart failure: Secondary | ICD-10-CM | POA: Diagnosis not present

## 2022-05-07 DIAGNOSIS — D631 Anemia in chronic kidney disease: Secondary | ICD-10-CM | POA: Diagnosis not present

## 2022-05-07 DIAGNOSIS — I129 Hypertensive chronic kidney disease with stage 1 through stage 4 chronic kidney disease, or unspecified chronic kidney disease: Secondary | ICD-10-CM | POA: Diagnosis not present

## 2022-05-07 DIAGNOSIS — E875 Hyperkalemia: Secondary | ICD-10-CM | POA: Diagnosis not present

## 2022-05-07 LAB — URINALYSIS, ROUTINE W REFLEX MICROSCOPIC
Bilirubin Urine: NEGATIVE
Hgb urine dipstick: NEGATIVE
Ketones, ur: NEGATIVE
Leukocytes,Ua: NEGATIVE
Nitrite: NEGATIVE
RBC / HPF: NONE SEEN (ref 0–?)
Specific Gravity, Urine: 1.01 (ref 1.000–1.030)
Total Protein, Urine: NEGATIVE
Urine Glucose: >=1000 — AB
Urobilinogen, UA: 0.2 (ref 0.0–1.0)
WBC, UA: NONE SEEN (ref 0–?)
pH: 5.5 (ref 5.0–8.0)

## 2022-05-07 LAB — MICROALBUMIN / CREATININE URINE RATIO
Creatinine,U: 13 mg/dL
Microalb Creat Ratio: 5.4 mg/g (ref 0.0–30.0)
Microalb, Ur: <0.7 mg/dL (ref 0.0–1.9)

## 2022-05-07 LAB — HEPATIC FUNCTION PANEL
ALT: 35 U/L (ref 0–53)
AST: 31 U/L (ref 0–37)
Albumin: 4.5 g/dL (ref 3.5–5.2)
Alkaline Phosphatase: 83 U/L (ref 39–117)
Bilirubin, Direct: 0.1 mg/dL (ref 0.0–0.3)
Total Bilirubin: 0.6 mg/dL (ref 0.2–1.2)
Total Protein: 6.7 g/dL (ref 6.0–8.3)

## 2022-05-07 LAB — CBC WITH DIFFERENTIAL/PLATELET
Basophils Absolute: 0 10*3/uL (ref 0.0–0.1)
Basophils Relative: 0.1 % (ref 0.0–3.0)
Eosinophils Absolute: 0.1 10*3/uL (ref 0.0–0.7)
Eosinophils Relative: 1.1 % (ref 0.0–5.0)
HCT: 40.3 % (ref 39.0–52.0)
Hemoglobin: 13.7 g/dL (ref 13.0–17.0)
Lymphocytes Relative: 24.2 % (ref 12.0–46.0)
Lymphs Abs: 1.9 10*3/uL (ref 0.7–4.0)
MCHC: 33.9 g/dL (ref 30.0–36.0)
MCV: 86.9 fl (ref 78.0–100.0)
Monocytes Absolute: 0.4 10*3/uL (ref 0.1–1.0)
Monocytes Relative: 5.2 % (ref 3.0–12.0)
Neutro Abs: 5.4 10*3/uL (ref 1.4–7.7)
Neutrophils Relative %: 69.4 % (ref 43.0–77.0)
Platelets: 141 10*3/uL — ABNORMAL LOW (ref 150.0–400.0)
RBC: 4.64 Mil/uL (ref 4.22–5.81)
RDW: 15 % (ref 11.5–15.5)
WBC: 7.7 10*3/uL (ref 4.0–10.5)

## 2022-05-07 LAB — FOLATE: Folate: >23.8 ng/mL (ref >5.9)

## 2022-05-07 LAB — LIPID PANEL
Cholesterol: 118 mg/dL (ref 0–200)
HDL: 36.5 mg/dL — ABNORMAL LOW (ref >39.00)
LDL Cholesterol: 43 mg/dL (ref 0–99)
NonHDL: 81.37
Total CHOL/HDL Ratio: 3
Triglycerides: 194 mg/dL — ABNORMAL HIGH (ref 0.0–149.0)
VLDL: 38.8 mg/dL (ref 0.0–40.0)

## 2022-05-07 LAB — HEMOGLOBIN A1C: Hgb A1c MFr Bld: 8 % — ABNORMAL HIGH (ref 4.6–6.5)

## 2022-05-07 LAB — VITAMIN B12: Vitamin B-12: 671 pg/mL (ref 211–911)

## 2022-05-07 LAB — PSA: PSA: 3.5 ng/mL (ref 0.10–4.00)

## 2022-05-07 MED ORDER — GVOKE HYPOPEN 2-PACK 1 MG/0.2ML ~~LOC~~ SOAJ: 1.0000 | Freq: Every day | SUBCUTANEOUS | 5 refills | Status: DC | PRN

## 2022-05-07 NOTE — Progress Notes (Unsigned)
Subjective:  Patient ID: William [name], male    DOB: 26-Apr-1951  Age: 71 y.o. MRN: [id number]  CC: Annual Exam, Atrial Fibrillation, Hypertension, Hyperlipidemia, and Diabetes   HPI William [name] presents for a CPX and f/up -   He has stable LE edema - he denies CP, DOE, SOB, diaphoresis. His BS has been >200 recently.   Outpatient Medications Prior to Visit  Medication Sig Dispense Refill   albuterol (PROVENTIL HFA;VENTOLIN HFA) 108 (90 Base) MCG/ACT inhaler Inhale 2 puffs into the lungs every 6 (six) hours as needed for wheezing or shortness of breath. 1 Inhaler 0   aspirin EC 81 MG tablet Take 1 tablet (81 mg total) by mouth daily. 30 tablet 3   carvedilol (COREG) 3.125 MG tablet TAKE 1 TABLET(3.125 MG) BY MOUTH TWICE DAILY WITH A MEAL 180 tablet 0   Continuous Blood Gluc Receiver (FREESTYLE LIBRE 2 READER) DEVI 1 Act by Does not apply route daily. 2 each 5   Continuous Blood Gluc Sensor (FREESTYLE LIBRE 2 SENSOR) MISC USE TO CHECK BLOOD SUGAR AND CHANGE EVERY 14 DAYS 2 each 5   FARXIGA 10 MG TABS tablet TAKE 1 TABLET(10 MG) BY MOUTH DAILY BEFORE BREAKFAST 90 tablet 0   gabapentin (NEURONTIN) 300 MG capsule TAKE 1 CAPSULE(300 MG) BY MOUTH TWICE DAILY 180 capsule 1   HUMALOG KWIKPEN 100 UNIT/ML KwikPen INJECT 10 UNITS UNDER THE SKIN THREE TIMES DAILY AS DIRECTED BY SLIDING SCALE (Patient taking differently: Inject 10-45 Units into the skin 3 (three) times daily. Pt takes with meals) 27 mL 1   insulin glargine (LANTUS SOLOSTAR) 100 UNIT/ML Solostar Pen INJECT 55 UNITS UNDER THE SKIN DAILY (Patient taking differently: INJECT 55 UNITS UNDER THE SKIN DAILY PT TAKES IN THE PM) 51 mL 0   Insulin Pen Needle 31G X 8 MM MISC 1 each by Does not apply route 4 (four) times daily. 300 each 2   pantoprazole (PROTONIX) 40 MG tablet TAKE 1 TABLET(40 MG) BY MOUTH TWICE DAILY BEFORE A MEAL 180 tablet 0   pramipexole (MIRAPEX) 0.125 MG tablet TAKE 1 TABLET(0.125 MG) BY MOUTH AT BEDTIME 90 tablet 1    rosuvastatin (CRESTOR) 40 MG tablet TAKE 1 TABLET BY MOUTH EVERY DAY 90 tablet 3   torsemide (DEMADEX) 20 MG tablet TAKE 1 TABLET(20 MG) BY MOUTH 3 TIMES A WEEK Monday, Wednesday and Friday 38 tablet 1   warfarin (COUMADIN) 5 MG tablet TAKE 1 TO 2 TABLETS BY MOUTH DAILY AS DIRECTED BY COUMADIN CLINIC 180 tablet 1   celecoxib (CELEBREX) 100 MG capsule Take 1 capsule (100 mg total) by mouth 2 (two) times daily as needed for mild pain or moderate pain. 30 capsule 1   Glucagon (GVOKE HYPOPEN 2-PACK) 1 MG/0.2ML SOAJ Inject 1 Act into the skin daily as needed. 2 mL 5   No facility-administered medications prior to visit.    ROS Review of Systems  Constitutional: Negative.  Negative for chills, diaphoresis, fatigue and fever.  HENT: Negative.    Eyes: Negative.   Respiratory:  Negative for cough, chest tightness, shortness of breath and wheezing.   Cardiovascular:  Positive for leg swelling. Negative for chest pain and palpitations.  Gastrointestinal:  Positive for constipation. Negative for abdominal pain, diarrhea, nausea and vomiting.  Endocrine: Negative.   Genitourinary: Negative.  Negative for difficulty urinating and hematuria.  Musculoskeletal: Negative.  Negative for myalgias.  Skin: Negative.   Neurological: Negative.  Negative for dizziness and weakness.  Hematological:  Negative for adenopathy. Does  not bruise/bleed easily.  Psychiatric/Behavioral: Negative.      Objective:  BP 128/74 (BP Location: Left Arm, Patient Position: Sitting, Cuff Size: Large)   Pulse 68   Temp 97.9 F (36.6 C) (Oral)   Ht '5\' 5"'$  (1.651 m)   Wt 179 lb (81.2 kg)   SpO2 95%   BMI 29.79 kg/m   BP Readings from Last 3 Encounters:  05/08/22 (!) 96/49  05/07/22 128/74  11/07/21 139/69    Wt Readings from Last 3 Encounters:  05/08/22 180 lb (81.6 kg)  05/07/22 179 lb (81.2 kg)  03/15/22 178 lb (80.7 kg)    Physical Exam Vitals reviewed.  HENT:     Mouth/Throat:     Mouth: Mucous membranes  are moist.  Eyes:     General: No scleral icterus.    Conjunctiva/sclera: Conjunctivae normal.  Cardiovascular:     Rate and Rhythm: Normal rate and regular rhythm.     Heart sounds: Normal heart sounds, S1 normal and S2 normal. No murmur heard.    Comments: EKG- NSR, 63 bpm Nonspecific IV block - new Anterior/inferior/lateral infarct patterns are old Pulmonary:     Breath sounds: No stridor. No wheezing, rhonchi or rales.  Abdominal:     General: Abdomen is flat.     Palpations: There is no mass.     Tenderness: There is no abdominal tenderness. There is no guarding.     Hernia: No hernia is present.  Musculoskeletal:     Cervical back: Neck supple.     Right lower leg: Edema (trace edema) present.     Left lower leg: Edema (trace edema) present.  Lymphadenopathy:     Cervical: No cervical adenopathy.  Neurological:     General: No focal deficit present.     Mental Status: He is alert.  Psychiatric:        Mood and Affect: Mood normal.        Behavior: Behavior normal.     Lab Results  Component Value Date   WBC 7.7 05/07/2022   HGB 13.7 05/07/2022   HCT 40.3 05/07/2022   PLT 141.0 (L) 05/07/2022   GLUCOSE 190 (H) 11/06/2021   CHOL 118 05/07/2022   TRIG 194.0 (H) 05/07/2022   HDL 36.50 (L) 05/07/2022   LDLCALC 43 05/07/2022   ALT 35 05/07/2022   AST 31 05/07/2022   NA 139 11/06/2021   K 4.8 11/06/2021   CL 107 11/06/2021   CREATININE 1.57 (H) 11/06/2021   BUN 27 (H) 11/06/2021   CO2 26 11/06/2021   TSH 2.53 07/10/2021   PSA 3.50 05/07/2022   INR 2.2 04/02/2022   HGBA1C 8.0 (H) 05/07/2022   MICROALBUR <0.7 05/07/2022    VAS [place] ABI WITH/WO TBI  Result Date: 01/29/2022  LOWER EXTREMITY DOPPLER STUDY Patient Name:  William [name]  Date of Exam:   01/29/2022 Medical Rec #: [id number]    Accession #:    [id number] Date of Birth: 04-30-1951   Patient Gender: M Patient Age:   49 years Exam Location:  [facility] Procedure:      VAS [place] ABI WITH/WO TBI Referring Phys:  [facility] ARIDA --------------------------------------------------------------------------------  Indications: Peripheral artery disease. High Risk Factors: Hypertension, hyperlipidemia, Diabetes, past history of                    smoking, coronary artery disease.  Vascular Interventions: 05/2016-right distal SFA angioplasty. S/P  transmetatarsal amputation of foot, right. Comparison Study: Previous ABIs performed 01/25/21 were 0.89 on the right and                   0.85 on the left. Performing Technologist: Mariane Masters RVT  Examination Guidelines: A complete evaluation includes at minimum, Doppler waveform signals and systolic blood pressure reading at the level of bilateral brachial, anterior tibial, and posterior tibial arteries, when vessel segments are accessible. Bilateral testing is considered an integral part of a complete examination. Photoelectric Plethysmograph (PPG) waveforms and toe systolic pressure readings are included as required and additional duplex testing as needed. Limited examinations for reoccurring indications may be performed as noted.  ABI Findings: +---------+------------------+-----+----------+--------------------------+ Right    Rt Pressure (mmHg)IndexWaveform  Comment                    +---------+------------------+-----+----------+--------------------------+ Brachial 122                                                         +---------+------------------+-----+----------+--------------------------+ PTA      70                0.56 monophasic                           +---------+------------------+-----+----------+--------------------------+ PERO     118               0.94 monophasic                           +---------+------------------+-----+----------+--------------------------+ DP       115               0.92 monophasic                           +---------+------------------+-----+----------+--------------------------+  Great Toe                                 transmetatarsal amputation +---------+------------------+-----+----------+--------------------------+ +---------+------------------+-----+----------+-------+ Left     Lt Pressure (mmHg)IndexWaveform  Comment +---------+------------------+-----+----------+-------+ Brachial 125                                      +---------+------------------+-----+----------+-------+ PTA      112               0.90 monophasic        +---------+------------------+-----+----------+-------+ PERO     113               0.90 monophasic        +---------+------------------+-----+----------+-------+ DP       112               0.90 monophasic        +---------+------------------+-----+----------+-------+ Great Toe55                0.44 Abnormal          +---------+------------------+-----+----------+-------+ +-------+-----------+-----------+------------+------------+ ABI/TBIToday's ABIToday's TBIPrevious ABIPrevious TBI +-------+-----------+-----------+------------+------------+ Right  0.94       amp.       0.89  amp.         +-------+-----------+-----------+------------+------------+ Left   0.90       0.44       0.85        0.54         +-------+-----------+-----------+------------+------------+ Bilateral ABIs appear essentially unchanged compared to prior study on 01/25/21.  Summary: Right: Resting right ankle-brachial index indicates mild right lower extremity arterial disease. Left: Resting left ankle-brachial index indicates mild left lower extremity arterial disease. The left toe-brachial index is abnormal. *See table(s) above for measurements and observations. See arterial duplex report. Suggest follow up study in 12 months. Electronically signed by Larae Grooms MD on 01/29/2022 at 8:50:30 PM.    Final    VAS [place] LOWER EXTREMITY ARTERIAL DUPLEX  Result Date: 01/29/2022 LOWER EXTREMITY ARTERIAL DUPLEX STUDY Patient Name:   ROMIR KLIMOWICZ  Date of Exam:   01/29/2022 Medical Rec #: [id number]    Accession #:    [id number] Date of Birth: 1950-08-24   Patient Gender: M Patient Age:   3 years Exam Location:  [facility] Procedure:      VAS [place] LOWER EXTREMITY ARTERIAL DUPLEX Referring Phys: [facility] ARIDA --------------------------------------------------------------------------------  Indications: Peripheral artery disease. Patient denies claudication symptoms or              rest pain at this time. High Risk Factors: Hypertension, hyperlipidemia, Diabetes, past history of                    smoking.  Vascular Interventions: 05/2016-right distal SFA angioplasty. S/P                         transmetatarsal amputation of foot, right. Current ABI:            Right-0.94                         Left- 0.90 Comparison Study: Previous arterial duplex 01/25/21 showed right SFA prox 50-74%                   stenosis and 30-49% popliteal stenosis. Left CFA and SFA                   30-49% stenosis and left proximal popliteal 75-99% stenosis. Performing Technologist: Mariane Masters RVT  Examination Guidelines: A complete evaluation includes B-mode imaging, spectral Doppler, color Doppler, and power Doppler as needed of all accessible portions of each vessel. Bilateral testing is considered an integral part of a complete examination. Limited examinations for reoccurring indications may be performed as noted.  +----------+--------+-----+---------------+--------+--------+ RIGHT     PSV cm/sRatioStenosis       WaveformComments +----------+--------+-----+---------------+--------+--------+ CFA Prox  137                         biphasic         +----------+--------+-----+---------------+--------+--------+ CFA Distal113                         biphasic         +----------+--------+-----+---------------+--------+--------+ DFA       137                         biphasic         +----------+--------+-----+---------------+--------+--------+  SFA Prox  305     3.7  50-74% stenosisbiphasic         +----------+--------+-----+---------------+--------+--------+  SFA Mid   151                         biphasic         +----------+--------+-----+---------------+--------+--------+ SFA Distal78                          biphasic         +----------+--------+-----+---------------+--------+--------+ POP Prox  101                         biphasic         +----------+--------+-----+---------------+--------+--------+ POP Mid   138                         biphasic         +----------+--------+-----+---------------+--------+--------+ POP Distal60                          biphasic         +----------+--------+-----+---------------+--------+--------+ TP Trunk  85                          biphasic         +----------+--------+-----+---------------+--------+--------+ A focal velocity elevation of 305 cm/s was obtained at proximal SFA with post stenotic turbulence with a VR of 3.7. Findings are characteristic of 50-74% stenosis.  Summary: Right: 50-74% stenosis noted in the proximal superficial femoral artery. No significant change compared to previous study.  See table(s) above for measurements and observations. See ABI report. Suggest follow up study in 12 months. Electronically signed by Larae Grooms MD on 01/29/2022 at 8:50:09 PM.    Final     Assessment & Plan:   Gwynn was seen today for annual exam, atrial fibrillation, hypertension, hyperlipidemia and diabetes.  Diagnoses and all orders for this visit:  Encounter for general adult medical examination with abnormal findings- Exam completed, labs reviewed, vaccines reviewed and updated, cancer screenings are UTD, pt ed material was given. -     Zoster Vaccine Adjuvanted [facility]) injection; Inject 0.5 mLs into the muscle once for 1 dose.  Insulin-requiring or dependent type II diabetes mellitus ([place])- His A1C is up to 8.0%. will slowly increase the mounjaro  dose. -     Glucagon (GVOKE HYPOPEN 2-PACK) 1 MG/0.2ML SOAJ; Inject 1 Act into the skin daily as needed. -     HM Diabetes Foot Exam -     Urinalysis, Routine w reflex microscopic; Future -     Microalbumin / creatinine urine ratio; Future -     Hemoglobin A1c; Future -     Hemoglobin A1c -     Microalbumin / creatinine urine ratio -     Urinalysis, Routine w reflex microscopic  Type 2 diabetes mellitus with complication, with long-term current use of insulin ([name]) -     Hemoglobin A1c; Future -     Hemoglobin A1c  Type 2 diabetes mellitus with stage 3b chronic kidney disease, without long-term current use of insulin ([name]) -     Hemoglobin A1c; Future -     Hemoglobin A1c  Hyperlipidemia LDL goal <70- LDL goal achieved. Doing well on the statin  -     Lipid panel; Future -     Hepatic function panel; Future -     Hepatic function panel -  Lipid panel  Paroxysmal atrial fibrillation ([place])- He has good rate and rhythm control. -     EKG 68-TMHD  Chronic systolic HF (heart failure) ([place])- Volume status is stable. -     EKG 12-Lead  Stage 3b chronic kidney disease ([name]) -     Urinalysis, Routine w reflex microscopic; Future -     Microalbumin / creatinine urine ratio; Future -     Microalbumin / creatinine urine ratio -     Urinalysis, Routine w reflex microscopic  Thrombocytopenia ([place])- Stable. No bleeding. -     CBC with Differential/Platelet; Future -     Folate; Future -     Vitamin B12; Future -     Vitamin B12 -     Folate -     CBC with Differential/Platelet  Prostate cancer screening -     PSA; Future -     PSA  Mild nonproliferative diabetic retinopathy of both eyes without macular edema associated with type 2 diabetes mellitus ([place]) - Will try to get better blood sugar control.   I have discontinued Jorel Mcnerney's celecoxib. I am also having him start on Shingrix. Additionally, I am having him maintain his aspirin EC, albuterol, FreeStyle Libre 2 Reader,  Lantus SoloStar, HumaLOG KwikPen, rosuvastatin, warfarin, pramipexole, Farxiga, FreeStyle Libre 2 Sensor, Insulin Pen Needle, gabapentin, carvedilol, pantoprazole, torsemide, and Gvoke HypoPen 2-Pack.  Meds ordered this encounter  Medications   Glucagon (GVOKE HYPOPEN 2-PACK) 1 MG/0.2ML SOAJ    Sig: Inject 1 Act into the skin daily as needed.    Dispense:  2 mL    Refill:  5   Zoster Vaccine Adjuvanted [facility]) injection    Sig: Inject 0.5 mLs into the muscle once for 1 dose.    Dispense:  0.5 mL    Refill:  1     Follow-up: Return in about 6 months (around 11/05/2022).  Scarlette Calico, MD

## 2022-05-07 NOTE — Patient Instructions (Signed)
Health Maintenance, Male Adopting a healthy lifestyle and getting preventive care are important in promoting health and wellness. Ask your health care provider about: The right schedule for you to have regular tests and exams. Things you can do on your own to prevent diseases and keep yourself healthy. What should I know about diet, weight, and exercise? Eat a healthy diet  Eat a diet that includes plenty of vegetables, fruits, low-fat dairy products, and lean protein. Do not eat a lot of foods that are high in solid fats, added sugars, or sodium. Maintain a healthy weight Body mass index (BMI) is a measurement that can be used to identify possible weight problems. It estimates body fat based on height and weight. Your health care provider can help determine your BMI and help you achieve or maintain a healthy weight. Get regular exercise Get regular exercise. This is one of the most important things you can do for your health. Most adults should: Exercise for at least 150 minutes each week. The exercise should increase your heart rate and make you sweat (moderate-intensity exercise). Do strengthening exercises at least twice a week. This is in addition to the moderate-intensity exercise. Spend less time sitting. Even light physical activity can be beneficial. Watch cholesterol and blood lipids Have your blood tested for lipids and cholesterol at 71 years of age, then have this test every 5 years. You may need to have your cholesterol levels checked more often if: Your lipid or cholesterol levels are high. You are older than 71 years of age. You are at high risk for heart disease. What should I know about cancer screening? Many types of cancers can be detected early and may often be prevented. Depending on your health history and family history, you may need to have cancer screening at various ages. This may include screening for: Colorectal cancer. Prostate cancer. Skin cancer. Lung  cancer. What should I know about heart disease, diabetes, and high blood pressure? Blood pressure and heart disease High blood pressure causes heart disease and increases the risk of stroke. This is more likely to develop in people who have high blood pressure readings or are overweight. Talk with your health care provider about your target blood pressure readings. Have your blood pressure checked: Every 3-5 years if you are 18-39 years of age. Every year if you are 40 years old or older. If you are between the ages of 65 and 75 and are a current or former smoker, ask your health care provider if you should have a one-time screening for abdominal aortic aneurysm (AAA). Diabetes Have regular diabetes screenings. This checks your fasting blood sugar level. Have the screening done: Once every three years after age 45 if you are at a normal weight and have a low risk for diabetes. More often and at a younger age if you are overweight or have a high risk for diabetes. What should I know about preventing infection? Hepatitis B If you have a higher risk for hepatitis B, you should be screened for this virus. Talk with your health care provider to find out if you are at risk for hepatitis B infection. Hepatitis C Blood testing is recommended for: Everyone born from 1945 through 1965. Anyone with known risk factors for hepatitis C. Sexually transmitted infections (STIs) You should be screened each year for STIs, including gonorrhea and chlamydia, if: You are sexually active and are younger than 71 years of age. You are older than 71 years of age and your   health care provider tells you that you are at risk for this type of infection. Your sexual activity has changed since you were last screened, and you are at increased risk for chlamydia or gonorrhea. Ask your health care provider if you are at risk. Ask your health care provider about whether you are at high risk for HIV. Your health care provider  may recommend a prescription medicine to help prevent HIV infection. If you choose to take medicine to prevent HIV, you should first get tested for HIV. You should then be tested every 3 months for as long as you are taking the medicine. Follow these instructions at home: Alcohol use Do not drink alcohol if your health care provider tells you not to drink. If you drink alcohol: Limit how much you have to 0-2 drinks a day. Know how much alcohol is in your drink. In the [place] one drink equals one 12 oz bottle of beer (355 mL), one 5 oz glass of wine (148 mL), or one 1 oz glass of hard liquor (44 mL). Lifestyle Do not use any products that contain nicotine or tobacco. These products include cigarettes, chewing tobacco, and vaping devices, such as e-cigarettes. If you need help quitting, ask your health care provider. Do not use street drugs. Do not share needles. Ask your health care provider for help if you need support or information about quitting drugs. General instructions Schedule regular health, dental, and eye exams. Stay current with your vaccines. Tell your health care provider if: You often feel depressed. You have ever been abused or do not feel safe at home. Summary Adopting a healthy lifestyle and getting preventive care are important in promoting health and wellness. Follow your health care provider's instructions about healthy diet, exercising, and getting tested or screened for diseases. Follow your health care provider's instructions on monitoring your cholesterol and blood pressure. This information is not intended to replace advice given to you by your health care provider. Make sure you discuss any questions you have with your health care provider. Document Revised: 11/07/2020 Document Reviewed: 11/07/2020 Elsevier Patient Education  2023 [employer] Inc.  

## 2022-05-08 ENCOUNTER — Ambulatory Visit: Payer: Medicare Other | Attending: Cardiovascular Disease | Admitting: Cardiovascular Disease

## 2022-05-08 ENCOUNTER — Encounter: Payer: Self-pay | Admitting: Cardiovascular Disease

## 2022-05-08 VITALS — BP 96/49 | HR 68 | Ht 65.0 in | Wt 180.0 lb

## 2022-05-08 DIAGNOSIS — E785 Hyperlipidemia, unspecified: Secondary | ICD-10-CM

## 2022-05-08 DIAGNOSIS — I480 Paroxysmal atrial fibrillation: Secondary | ICD-10-CM

## 2022-05-08 DIAGNOSIS — I5022 Chronic systolic (congestive) heart failure: Secondary | ICD-10-CM

## 2022-05-08 DIAGNOSIS — I739 Peripheral vascular disease, unspecified: Secondary | ICD-10-CM

## 2022-05-08 NOTE — Patient Instructions (Signed)
Medication Instructions:  No changes *If you need a refill on your cardiac medications before your next appointment, please call your pharmacy*   Lab Work: None ordered If you have labs (blood work) drawn today and your tests are completely normal, you will receive your results only by: [place] (if you have MyChart) OR A paper copy in the mail If you have any lab test that is abnormal or we need to change your treatment, we will call you to review the results.   Testing/Procedures: None ordered   Follow-Up: At [facility], you and your health needs are our priority.  As part of our continuing mission to provide you with exceptional heart care, we have created designated Provider Care Teams.  These Care Teams include your primary Cardiologist (physician) and Advanced Practice Providers (APPs -  Physician Assistants and Nurse Practitioners) who all work together to provide you with the care you need, when you need it.  We recommend signing up for the patient portal called "MyChart".  Sign up information is provided on this After Visit Summary.  MyChart is used to connect with patients for Virtual Visits (Telemedicine).  Patients are able to view lab/test results, encounter notes, upcoming appointments, etc.  Non-urgent messages can be sent to your provider as well.   To learn more about what you can do with MyChart, go to [email or web address].    Your next appointment:   6 month(s)  The format for your next appointment:   In Person  Provider:   [name], MD      Important Information About Sugar

## 2022-05-08 NOTE — Progress Notes (Signed)
Cardiology Office Note   Date:  05/08/2022   ID:  William [name], DOB 05-Mar-1951, MRN [id number]  PCP:  [name], MD  Cardiologist:   [name], MD   No chief complaint on file.      History of Present Illness: William [name] is a 71 y.o. male who is here today for a follow-up visit regarding peripheral arterial disease, coronary artery disease and paroxysmal atrial fibrillation. He has known history of inferior myocardial infarction complicated by VSD in September, 2017. He is status post one-vessel CABG and VSD repair. He had postoperative atrial fibrillation and has been on anticoagulation. He had amputation of the right great toe while hospitalized for gangrene. The patient has known history of diabetes.  He is known to have peripheral arterial disease. Angiography in November, 2017 showed no significant aortoiliac disease, significant right distal SFA stenosis and one-vessel runoff below the knee via the peroneal artery with reconstitution of the dorsalis pedis distally. I performed successful drug-coated balloon angioplasty of the right SFA. He is s/p right transmetatarsal amputation for osteomyelitis.    He had angiography to the left leg in March 2018 which showed moderate stenosis affecting the left popliteal artery and TP trunk with 1 vessel runoff below the knee via the peroneal artery which was a large dominant vessel and reconstituted the dorsalis pedis and posterior tibial at the level of the ankle. There was brisk flow to the toes. Thus, no revascularization was performed.  Most recent vascular studies in July of this year showed an ABI of 0.94 on the right and 0.90 on the left.   Duplex on the right showed moderately elevated velocity in the proximal SFA which was unchanged from before.  He has been doing well with no chest pain, shortness of breath or lower extremity claudication.    Past Medical History:  Diagnosis Date   AKI (acute kidney injury) ([place])     With STEMI in 8338   Chronic systolic CHF (congestive heart failure) ([name])    Depression    Diabetes mellitus without complication ([name])    Diabetic retinopathy ([name])    GERD (gastroesophageal reflux disease)    Hx of adenomatous colonic polyps 04/07/2018   Hyperlipidemia    Hypertension    Hypertensive retinopathy    Paroxysmal atrial fibrillation ([name])    Peripheral vascular disease ([name])    Pneumonia    Seizures ([name])    hx of as a child   STEMI (ST elevation myocardial infarction) ([place]) 2017    Past Surgical History:  Procedure Laterality Date   ABDOMINAL AORTOGRAM W/LOWER EXTREMITY N/A 09/19/2016   Procedure: Abdominal Aortogram w/Lower Extremity;  Surgeon: Wellington Hampshire, MD;  Location: [place] [facility];  Service: Cardiovascular;  Laterality: N/A;   AMPUTATION Right 03/23/2016   Procedure: 1st and 2nd Ray Amputation Right Foot;  Surgeon: Newt Minion, MD;  Location: [place];  Service: Orthopedics;  Laterality: Right;   AMPUTATION Right 06/21/2016   Procedure: RIGHT TRANSMETATARSAL AMPUTATION;  Surgeon: Newt Minion, MD;  Location: [place];  Service: Orthopedics;  Laterality: Right;   CARDIAC CATHETERIZATION N/A 03/13/2016   Procedure: Right/Left Heart Cath and Coronary Angiography;  Surgeon: Sherren Mocha, MD;  Location: [place] [facility];  Service: Cardiovascular;  Laterality: N/A;   CARDIAC CATHETERIZATION N/A 03/13/2016   Procedure: IABP Insertion;  Surgeon: Sherren Mocha, MD;  Location: [place] [facility];  Service: Cardiovascular;  Laterality: N/A;   CATARACT EXTRACTION  CATARACT EXTRACTION, BILATERAL     CERVICAL FUSION  1982, 1992   has had 3 neck surgeries from breaking his neck   CIRCUMCISION N/A 11/07/2021   Procedure: CIRCUMCISION ADULT;  Surgeon: Vira Agar, MD;  Location: [facility];  Service: Urology;  Laterality: N/A;   CORONARY ARTERY BYPASS GRAFT N/A 03/13/2016   Procedure: CORONARY ARTERY BYPASS GRAFTING (CABG) x 1 (SVG to OM) with EVH from [place];  Surgeon: Ivin Poot, MD;  Location: [place];  Service: Open Heart Surgery;  Laterality: N/A;   EYE SURGERY     LOWER EXTREMITY ANGIOGRAM  05/02/2016   Procedure: Lower Extremity Angiogram;  Surgeon: Wellington Hampshire, MD;  Location: [place];  Service: Cardiovascular;;  Limited left femoral runoff right femoral runoff   PERIPHERAL VASCULAR CATHETERIZATION N/A 05/02/2016   Procedure: Abdominal Aortogram;  Surgeon: Wellington Hampshire, MD;  Location: [place] [facility];  Service: Cardiovascular;  Laterality: N/A;   PERIPHERAL VASCULAR CATHETERIZATION Right 05/02/2016   Procedure: Peripheral Vascular Balloon Angioplasty;  Surgeon: Wellington Hampshire, MD;  Location: [place] [facility];  Service: Cardiovascular;  Laterality: Right;  SFA   TEE WITHOUT CARDIOVERSION N/A 03/13/2016   Procedure: TRANSESOPHAGEAL ECHOCARDIOGRAM (TEE);  Surgeon: Ivin Poot, MD;  Location: [place];  Service: Open Heart Surgery;  Laterality: N/A;   VSD REPAIR N/A 03/13/2016   Procedure: VENTRICULAR SEPTAL DEFECT (VSD) REPAIR;  Surgeon: Ivin Poot, MD;  Location: [place];  Service: Open Heart Surgery;  Laterality: N/A;     Current Outpatient Medications  Medication Sig Dispense Refill   albuterol (PROVENTIL HFA;VENTOLIN HFA) 108 (90 Base) MCG/ACT inhaler Inhale 2 puffs into the lungs every 6 (six) hours as needed for wheezing or shortness of breath. 1 Inhaler 0   aspirin EC 81 MG tablet Take 1 tablet (81 mg total) by mouth daily. 30 tablet 3   carvedilol (COREG) 3.125 MG tablet TAKE 1 TABLET(3.125 MG) BY MOUTH TWICE DAILY WITH A MEAL 180 tablet 0   Continuous Blood Gluc Receiver (FREESTYLE LIBRE 2 READER) DEVI 1 Act by Does not apply route daily. 2 each 5   Continuous Blood Gluc Sensor (FREESTYLE LIBRE 2 SENSOR) MISC USE TO CHECK BLOOD SUGAR AND CHANGE EVERY 14 DAYS 2 each 5   FARXIGA 10 MG TABS tablet TAKE 1 TABLET(10 MG) BY MOUTH DAILY BEFORE BREAKFAST 90 tablet 0   gabapentin  (NEURONTIN) 300 MG capsule TAKE 1 CAPSULE(300 MG) BY MOUTH TWICE DAILY 180 capsule 1   Glucagon (GVOKE HYPOPEN 2-PACK) 1 MG/0.2ML SOAJ Inject 1 Act into the skin daily as needed. 2 mL 5   HUMALOG KWIKPEN 100 UNIT/ML KwikPen INJECT 10 UNITS UNDER THE SKIN THREE TIMES DAILY AS DIRECTED BY SLIDING SCALE (Patient taking differently: Inject 10-45 Units into the skin 3 (three) times daily. Pt takes with meals) 27 mL 1   insulin glargine (LANTUS SOLOSTAR) 100 UNIT/ML Solostar Pen INJECT 55 UNITS UNDER THE SKIN DAILY (Patient taking differently: INJECT 55 UNITS UNDER THE SKIN DAILY PT TAKES IN THE PM) 51 mL 0   Insulin Pen Needle 31G X 8 MM MISC 1 each by Does not apply route 4 (four) times daily. 300 each 2   pantoprazole (PROTONIX) 40 MG tablet TAKE 1 TABLET(40 MG) BY MOUTH TWICE DAILY BEFORE A MEAL 180 tablet 0   pramipexole (MIRAPEX) 0.125 MG tablet TAKE 1 TABLET(0.125 MG) BY MOUTH AT BEDTIME 90 tablet 1   rosuvastatin (CRESTOR) 40 MG tablet  TAKE 1 TABLET BY MOUTH EVERY DAY 90 tablet 3   torsemide (DEMADEX) 20 MG tablet TAKE 1 TABLET(20 MG) BY MOUTH 3 TIMES A WEEK Monday, Wednesday and Friday 38 tablet 1   warfarin (COUMADIN) 5 MG tablet TAKE 1 TO 2 TABLETS BY MOUTH DAILY AS DIRECTED BY COUMADIN CLINIC 180 tablet 1   No current facility-administered medications for this visit.    Allergies:   Morphine and related and Latex    Social History:  The patient  reports that he quit smoking about 6 years ago. His smoking use included cigarettes. He smoked an average of .5 packs per day. He has never used smokeless tobacco. He reports that he does not drink alcohol and does not use drugs.   Family History:  Not able to obtain due to distress.  ROS:  Please see the history of present illness.   Otherwise, review of systems are positive for none.   All other systems are reviewed and negative.    PHYSICAL EXAM: VS:  BP (!) 96/49 (BP Location: Left Arm, Patient Position: Sitting, Cuff Size: Normal)    Pulse 68   Ht '5\' 5"'$  (1.651 m)   Wt 180 lb (81.6 kg)   SpO2 96%   BMI 29.95 kg/m  , BMI Body mass index is 29.95 kg/m. GEN: Well nourished, well developed, in no acute distress  HEENT: normal  Neck: no JVD, carotid bruits, or masses Cardiac: RRR; no  rubs, or gallops,no edema . No murmurs Respiratory:  clear to auscultation bilaterally, normal work of breathing GI: soft, nontender, nondistended, + BS MS: no deformity or atrophy  Skin: warm and dry, no rash Neuro:  Strength and sensation are intact Psych: euthymic mood, full affect   EKG:  EKG is not ordered today.   Recent Labs: 07/10/2021: TSH 2.53 11/06/2021: BUN 27; Creatinine, Ser 1.57; Potassium 4.8; Sodium 139 05/07/2022: ALT 35; Hemoglobin 13.7; Platelets 141.0    Lipid Panel    Component Value Date/Time   CHOL 118 05/07/2022 1354   CHOL 90 (L) 01/05/2019 1123   TRIG 194.0 (H) 05/07/2022 1354   HDL 36.50 (L) 05/07/2022 1354   HDL 34 (L) 01/05/2019 1123   CHOLHDL 3 05/07/2022 1354   VLDL 38.8 05/07/2022 1354   LDLCALC 43 05/07/2022 1354   LDLCALC 24 01/05/2019 1123   LDLCALC 45 03/20/2017 1144      Wt Readings from Last 3 Encounters:  05/08/22 180 lb (81.6 kg)  05/07/22 179 lb (81.2 kg)  03/15/22 178 lb (80.7 kg)          No data to display             ASSESSMENT AND PLAN:  1.  Peripheral arterial disease :  He is status post drug-coated balloon angioplasty of the right SFA in 2017. He has significant tibial disease bilaterally.  He has no symptoms related to this and his ABI remains in the low normal range.  Continue medical therapy.  2. Chronic systolic heart failure: He appears to be euvolemic.   He is no longer on losartan due to low blood pressure and chronic kidney disease.  Most recent echo in 2018 showed an EF of 45 to 50%.  He appears to be euvolemic on current dose of torsemide 20 mg 3 times a week.  Continue small dose carvedilol and [place].  3. Paroxysmal atrial fibrillation: He is  maintaining in sinus rhythm.  He is on long-term anticoagulation with warfarin with no bleeding complications.  His INR has been therapeutic.  4. Coronary artery disease involving native coronary arteries without angina:  status post one-vessel CABG and VSD repair. Continue medical therapy.  5. Diabetes mellitus: Improved from before.  6. Hyperlipidemia: I reviewed his lipid profile done yesterday which showed an LDL of 43.  Continue rosuvastatin.   Disposition:   follow-up with me in 6 months  Signed,  [name], MD  05/08/2022 11:37 AM    [place]

## 2022-05-09 DIAGNOSIS — E113293 Type 2 diabetes mellitus with mild nonproliferative diabetic retinopathy without macular edema, bilateral: Secondary | ICD-10-CM | POA: Insufficient documentation

## 2022-05-09 DIAGNOSIS — Z125 Encounter for screening for malignant neoplasm of prostate: Secondary | ICD-10-CM | POA: Insufficient documentation

## 2022-05-09 MED ORDER — SHINGRIX 50 MCG/0.5ML IM SUSR: 0.5000 mL | Freq: Once | INTRAMUSCULAR | 1 refills | Status: AC

## 2022-05-10 ENCOUNTER — Encounter: Payer: Self-pay | Admitting: Internal Medicine

## 2022-05-14 ENCOUNTER — Ambulatory Visit: Payer: Medicare Other | Attending: Cardiology | Admitting: *Deleted

## 2022-05-14 DIAGNOSIS — I480 Paroxysmal atrial fibrillation: Secondary | ICD-10-CM

## 2022-05-14 LAB — POCT INR: INR: 2 (ref 2.0–3.0)

## 2022-05-14 NOTE — Patient Instructions (Signed)
Description   Continue taking 1.5 tablets daily except for 1 tablet every Monday, Wednesday and Friday. Recheck INR in 5 weeks. Anticoagulation Clinic [phone]

## 2022-05-15 NOTE — Progress Notes (Signed)
[facility] Note  05/29/2022    CHIEF COMPLAINT Patient presents for Retina Follow Up  HISTORY OF PRESENT ILLNESS: William [name] is a 71 y.o. male who presents to the clinic today for:   HPI     Retina Follow Up   Patient presents with  Diabetic Retinopathy.  In both eyes.  Severity is moderate.  Duration of 4 weeks.  Since onset it is stable.  I, the attending physician,  performed the HPI with the patient and updated documentation appropriately.        Comments   Patient states vision the same OU. BS was 188 this am. Last A1c was 8.0 on 11.06.23.      Last edited by [name], MD on 05/29/2022  8:48 AM.    Pt states no change in vision  Referring physician: [name], MD [place],  [place] [id number]  HISTORICAL INFORMATION:  Selected notes from the MEDICAL RECORD NUMBER Referred by Dr. [name] for eval of DME OU LEE:  Ocular Hx- PMH-    CURRENT MEDICATIONS: No current outpatient medications on file. (Ophthalmic Drugs)   No current facility-administered medications for this visit. (Ophthalmic Drugs)   Current Outpatient Medications (Other)  Medication Sig   albuterol (PROVENTIL HFA;VENTOLIN HFA) 108 (90 Base) MCG/ACT inhaler Inhale 2 puffs into the lungs every 6 (six) hours as needed for wheezing or shortness of breath.   aspirin EC 81 MG tablet Take 1 tablet (81 mg total) by mouth daily.   carvedilol (COREG) 3.125 MG tablet TAKE 1 TABLET(3.125 MG) BY MOUTH TWICE DAILY WITH A MEAL   Continuous Blood Gluc Receiver (FREESTYLE LIBRE 2 READER) DEVI 1 Act by Does not apply route daily.   Continuous Blood Gluc Sensor (FREESTYLE LIBRE 2 SENSOR) MISC USE TO CHECK BLOOD SUGAR AND CHANGE EVERY 14 DAYS   FARXIGA 10 MG TABS tablet TAKE 1 TABLET(10 MG) BY MOUTH DAILY BEFORE BREAKFAST   gabapentin (NEURONTIN) 300 MG capsule TAKE 1 CAPSULE(300 MG) BY MOUTH TWICE DAILY   Glucagon (GVOKE HYPOPEN 2-PACK) 1 MG/0.2ML SOAJ Inject 1  Act into the skin daily as needed.   HUMALOG KWIKPEN 100 UNIT/ML KwikPen INJECT 10 UNITS UNDER THE SKIN THREE TIMES DAILY AS DIRECTED BY SLIDING SCALE (Patient taking differently: Inject 10-45 Units into the skin 3 (three) times daily. Pt takes with meals)   insulin glargine (LANTUS SOLOSTAR) 100 UNIT/ML Solostar Pen INJECT 55 UNITS UNDER THE SKIN DAILY (Patient taking differently: INJECT 55 UNITS UNDER THE SKIN DAILY PT TAKES IN THE PM)   Insulin Pen Needle 31G X 8 MM MISC 1 each by Does not apply route 4 (four) times daily.   pantoprazole (PROTONIX) 40 MG tablet TAKE 1 TABLET(40 MG) BY MOUTH TWICE DAILY BEFORE A MEAL   pramipexole (MIRAPEX) 0.125 MG tablet TAKE 1 TABLET(0.125 MG) BY MOUTH AT BEDTIME   rosuvastatin (CRESTOR) 40 MG tablet TAKE 1 TABLET BY MOUTH EVERY DAY   torsemide (DEMADEX) 20 MG tablet TAKE 1 TABLET(20 MG) BY MOUTH 3 TIMES A WEEK Monday, Wednesday and Friday   warfarin (COUMADIN) 5 MG tablet TAKE 1 TO 2 TABLETS BY MOUTH DAILY AS DIRECTED BY COUMADIN CLINIC   No current facility-administered medications for this visit. (Other)   REVIEW OF SYSTEMS: ROS   Positive for: Endocrine, Eyes Negative for: Constitutional, Gastrointestinal, Neurological, Skin, Genitourinary, Musculoskeletal, HENT, Cardiovascular, Respiratory, Psychiatric, Allergic/Imm, Heme/Lymph Last edited by Roselee Nova D, COT on 05/29/2022  8:02 AM.  ALLERGIES Allergies  Allergen Reactions   Morphine And Related Shortness Of Breath and Other (See Comments)    UNSPECIFIED REACTION "Pt said it was too much"    Latex Rash   PAST MEDICAL HISTORY Past Medical History:  Diagnosis Date   AKI (acute kidney injury) ([place])    With STEMI in 4315   Chronic systolic CHF (congestive heart failure) ([place])    Depression    Diabetes mellitus without complication ([name])    Diabetic retinopathy ([name])    GERD (gastroesophageal reflux disease)    Hx of adenomatous colonic polyps 04/07/2018   Hyperlipidemia     Hypertension    Hypertensive retinopathy    Paroxysmal atrial fibrillation ([name])    Peripheral vascular disease ([name])    Pneumonia    Seizures ([name])    hx of as a child   STEMI (ST elevation myocardial infarction) ([place]) 2017   Past Surgical History:  Procedure Laterality Date   ABDOMINAL AORTOGRAM W/LOWER EXTREMITY N/A 09/19/2016   Procedure: Abdominal Aortogram w/Lower Extremity;  Surgeon: Wellington Hampshire, MD;  Location: [facility];  Service: Cardiovascular;  Laterality: N/A;   AMPUTATION Right 03/23/2016   Procedure: 1st and 2nd Ray Amputation Right Foot;  Surgeon: Newt Minion, MD;  Location: [facility];  Service: Orthopedics;  Laterality: Right;   AMPUTATION Right 06/21/2016   Procedure: RIGHT TRANSMETATARSAL AMPUTATION;  Surgeon: Newt Minion, MD;  Location: [facility];  Service: Orthopedics;  Laterality: Right;   CARDIAC CATHETERIZATION N/A 03/13/2016   Procedure: Right/Left Heart Cath and Coronary Angiography;  Surgeon: Sherren Mocha, MD;  Location: [facility];  Service: Cardiovascular;  Laterality: N/A;   CARDIAC CATHETERIZATION N/A 03/13/2016   Procedure: IABP Insertion;  Surgeon: Sherren Mocha, MD;  Location: [facility];  Service: Cardiovascular;  Laterality: N/A;   CATARACT EXTRACTION     CATARACT EXTRACTION, BILATERAL     CERVICAL FUSION  1982, 1992   has had 3 neck surgeries from breaking his neck   CIRCUMCISION N/A 11/07/2021   Procedure: CIRCUMCISION ADULT;  Surgeon: Vira Agar, MD;  Location: [facility];  Service: Urology;  Laterality: N/A;   CORONARY ARTERY BYPASS GRAFT N/A 03/13/2016   Procedure: CORONARY ARTERY BYPASS GRAFTING (CABG) x 1 (SVG to OM) with EVH from [place];  Surgeon: Ivin Poot, MD;  Location: [facility];  Service: Open Heart Surgery;  Laterality: N/A;   EYE SURGERY     LOWER EXTREMITY ANGIOGRAM  05/02/2016   Procedure: Lower Extremity Angiogram;  Surgeon: Wellington Hampshire, MD;  Location: [facility];   Service: Cardiovascular;;  Limited left femoral runoff right femoral runoff   PERIPHERAL VASCULAR CATHETERIZATION N/A 05/02/2016   Procedure: Abdominal Aortogram;  Surgeon: Wellington Hampshire, MD;  Location: [place];  Service: Cardiovascular;  Laterality: N/A;   PERIPHERAL VASCULAR CATHETERIZATION Right 05/02/2016   Procedure: Peripheral Vascular Balloon Angioplasty;  Surgeon: Wellington Hampshire, MD;  Location: [facility];  Service: Cardiovascular;  Laterality: Right;  SFA   TEE WITHOUT CARDIOVERSION N/A 03/13/2016   Procedure: TRANSESOPHAGEAL ECHOCARDIOGRAM (TEE);  Surgeon: Ivin Poot, MD;  Location: [facility];  Service: Open Heart Surgery;  Laterality: N/A;   VSD REPAIR N/A 03/13/2016   Procedure: VENTRICULAR SEPTAL DEFECT (VSD) REPAIR;  Surgeon: Ivin Poot, MD;  Location: [facility];  Service: Open Heart Surgery;  Laterality: N/A;   FAMILY HISTORY Family History  Problem Relation Age of Onset  Diabetes Maternal Grandmother    Diabetes Mother    Aneurysm Mother    Peripheral Artery Disease Mother    Coronary artery disease Mother    Peptic Ulcer Father    Retinoblastoma Daughter    Colon cancer Neg Hx    Rectal cancer Neg Hx    SOCIAL HISTORY Social History   Tobacco Use   Smoking status: Former    Packs/day: 0.50    Types: Cigarettes    Quit date: 11/20/2015    Years since quitting: 6.5   Smokeless tobacco: Never   Tobacco comments:    quit 2018  Vaping Use   Vaping Use: Never used  Substance Use Topics   Alcohol use: No   Drug use: No       OPHTHALMIC EXAM: Base Eye Exam     Visual Acuity (Snellen - Linear)       Right Left   Dist [place] 20/30 20/20   Dist ph [place] 20/25          Tonometry (Tonopen, 8:09 AM)       Right Left   Pressure 12 14         Pupils       Dark Light Shape React APD   Right 2 1 Round Slow None   Left 2 1 Round Slow None         Visual Fields (Counting fingers)       Left Right    Full Full          Extraocular Movement       Right Left    Full, Ortho Full, Ortho         Neuro/Psych     Oriented x3: Yes   Mood/Affect: Normal         Dilation     Both eyes: 1.0% Mydriacyl, 2.5% Phenylephrine @ 8:09 AM           Slit Lamp and Fundus Exam     Slit Lamp Exam       Right Left   Lids/Lashes Dermatochalasis - upper lid, mild MGD Dermatochalasis - upper lid, mild MGD   Conjunctiva/Sclera White and quiet White and quiet   Cornea trace PEE trace PEE   Anterior Chamber Deep and quiet Deep and quiet   Iris Round and dilated, No NVI Round and dilated, No NVI   Lens PC IOL in good position PC IOL in good position   Anterior Vitreous Vitreous syneresis, vitreous condensations Vitreous syneresis, Posterior vitreous detachment, vitreous condensations         Fundus Exam       Right Left   Disc Pink and Sharp mild Pallor, Sharp rim, mild tilt   C/D Ratio 0.3 0.3   Macula Flat, Blunted foveal reflex, scatted Microaneurysms/DBH - improved, scattered Cystic changes/edema greatest superior mac -- slightly improved Blunted foveal reflex, central IRF/edema - slightly improved, +MA/DBH -- improved   Vessels attenuated, mild tortuosity attenuated, Tortuous   Periphery Attached, +MA greatest posteriorly Attached, scattered MA greatest posteriorly           IMAGING AND PROCEDURES  Imaging and Procedures for 05/29/2022  OCT, Retina - OU - Both Eyes       Right Eye Quality was good. Central Foveal Thickness: 284. Progression has improved. Findings include no SRF, abnormal foveal contour, intraretinal hyper-reflective material, intraretinal fluid, vitreomacular adhesion (Mild interval improvement in central IRF ).   Left Eye Quality was good. Central Foveal Thickness: 255. Progression  has improved. Findings include no SRF, abnormal foveal contour, intraretinal hyper-reflective material, intraretinal fluid (Mild interval improvement in cystic changes temporal macula and  centrally).   Notes *Images captured and stored on drive  Diagnosis / Impression:  +DME OU OD: Mild interval improvement in central IRF  OS: Mild interval improvement in cystic changes temporal macula and centrally  Clinical management:  See below  Abbreviations: NFP - Normal foveal profile. CME - cystoid macular edema. PED - pigment epithelial detachment. IRF - intraretinal fluid. SRF - subretinal fluid. EZ - ellipsoid zone. ERM - epiretinal membrane. ORA - outer retinal atrophy. ORT - outer retinal tubulation. SRHM - subretinal hyper-reflective material. IRHM - intraretinal hyper-reflective material      Intravitreal Injection, Pharmacologic Agent - OD - Right Eye       Time Out 05/29/2022. 8:05 AM. Confirmed correct patient, procedure, site, and patient consented.   Anesthesia Topical anesthesia was used. Anesthetic medications included Lidocaine 2%, Proparacaine 0.5%.   Procedure Preparation included 5% betadine to ocular surface, eyelid speculum. A (32g) needle was used.   Injection: 2 mg aflibercept 2 MG/0.05ML   Route: Intravitreal, Site: Right Eye   NDC: A3590391, Lot: [id number], Expiration date: 08/30/2023, Waste: 0 mL   Post-op Post injection exam found visual acuity of at least counting fingers. The patient tolerated the procedure well. There were no complications. The patient received written and verbal post procedure care education. Post injection medications were not given.      Intravitreal Injection, Pharmacologic Agent - OS - Left Eye       Time Out 05/29/2022. 8:05 AM. Confirmed correct patient, procedure, site, and patient consented.   Anesthesia Topical anesthesia was used. Anesthetic medications included Lidocaine 2%, Proparacaine 0.5%.   Procedure Preparation included 5% betadine to ocular surface, eyelid speculum. A (32g) needle was used.   Injection: 2 mg aflibercept 2 MG/0.05ML   Route: Intravitreal, Site: Left Eye   NDC:  M7179715, Lot: [id number], Expiration date: 10/30/2022, Waste: 0 mL   Post-op Post injection exam found visual acuity of at least counting fingers. The patient tolerated the procedure well. There were no complications. The patient received written and verbal post procedure care education. Post injection medications were not given.            ASSESSMENT/PLAN:    ICD-10-CM   1. Moderate nonproliferative diabetic retinopathy of both eyes with macular edema associated with type 2 diabetes mellitus ([name])  E11.3313 OCT, Retina - OU - Both Eyes    Intravitreal Injection, Pharmacologic Agent - OD - Right Eye    Intravitreal Injection, Pharmacologic Agent - OS - Left Eye    aflibercept (EYLEA) SOLN 2 mg    aflibercept (EYLEA) SOLN 2 mg    2. Essential hypertension  I10     3. Hypertensive retinopathy of both eyes  H35.033     4. Pseudophakia, both eyes  Z96.1      1. Moderate Non-proliferative diabetic retinopathy, both eyes  - s/p IVA OD #1 (11.14.22), #2 (12.12.22), #3 (01.09.23) -- IVA resistance - s/p IVA OS #1 (10.17.22), #2 (11.14.22), #3 (12.12.22), #4 (01.09.23), #5 (03.15.23), #6 (04.12.23) -- IVA resistance - s/p IVE OD #1 (03.15.23), #2 (04.12.23), #3 (05.10.23), #4 (06.07.23), #5 (07.05.23), #6 (08.02.23), #7 (08.31.23), #8 (09.28.23), #9 (10.30.23) - s/p IVE OS #1 (05.10.23), #2 (06.07.23), #3 (07.05.23), #4 (08.02.23), #5 (08.31.23), #6 (09.28.23), #7 (10.30.23) - exam shows scattered MA, DBH OU - FA (10.17.22) shows late leaking MA OU, no NV OU -  OCT shows OD: Mild interval improvement in central IRF; OS: Mild interval improvement in cystic changes temporal macula and centrally at 4 wks - discussed possibility of elevated BP blunting efficacy of IVE - BCVA stable at 20/25 OD, OS improved to 20/20 - recommend IVE OD #10 and IVE OS #8 today, 11.28.23 for DME with follow up in 4-5 weeks - pt wishes to proceed - RBA of procedure discussed, questions answered - IVE informed  consent obtained and signed, 03.15.23 (OD) - IVE informed consent obtained and signed, 05.10.23 (OS) - see procedure note - f/u in 4-5 wks -- DFE/OCT, possible injection(s)  2,3. Hypertensive retinopathy OU - discussed importance of tight BP control and possibility of elevated BP blunting efficacy of IVE - monitor  4. Pseudophakia OU  - s/p CE/IOL OU  - IOL in good position, doing well  - monitor  Ophthalmic Meds Ordered this visit:  Meds ordered this encounter  Medications   aflibercept (EYLEA) SOLN 2 mg   aflibercept (EYLEA) SOLN 2 mg     Return for f/u 4-5 weeks, NPDR OU, DFE, OCT.  There are no Patient Instructions on file for this visit.  Explained the diagnoses, plan, and follow up with the patient and they expressed understanding.  Patient expressed understanding of the importance of proper follow up care.   This document serves as a record of services personally performed by Gardiner Sleeper, MD, PhD. It was created on their behalf by Renaldo Reel, [place] an ophthalmic technician. The creation of this record is the provider's dictation and/or activities during the visit.    Electronically signed by:  Renaldo Reel, COT  11.14.23 8:49 AM  This document serves as a record of services personally performed by Gardiner Sleeper, MD, PhD. It was created on their behalf by San Jetty. Owens Shark, OA an ophthalmic technician. The creation of this record is the provider's dictation and/or activities during the visit.    Electronically signed by: San Jetty. Owens Shark, [place] 11.28.2023 8:49 AM   Gardiner Sleeper, M.D., Ph.D. Diseases & Surgery of the Retina and Vitreous [facility]  I have reviewed the above documentation for accuracy and completeness, and I agree with the above. Gardiner Sleeper, M.D., Ph.D. 05/29/22 8:50 AM   Abbreviations: M myopia (nearsighted); A astigmatism; H hyperopia (farsighted); P presbyopia; Mrx spectacle prescription;  CTL contact  lenses; OD right eye; OS left eye; OU both eyes  XT exotropia; ET esotropia; PEK punctate epithelial keratitis; PEE punctate epithelial erosions; DES dry eye syndrome; MGD meibomian gland dysfunction; ATs artificial tears; PFAT's preservative free artificial tears; [place] nuclear sclerotic cataract; PSC posterior subcapsular cataract; ERM epi-retinal membrane; PVD posterior vitreous detachment; RD retinal detachment; DM diabetes mellitus; DR diabetic retinopathy; NPDR non-proliferative diabetic retinopathy; PDR proliferative diabetic retinopathy; CSME clinically significant macular edema; DME diabetic macular edema; dbh dot blot hemorrhages; CWS cotton wool spot; POAG primary open angle glaucoma; C/D cup-to-disc ratio; HVF humphrey visual field; GVF goldmann visual field; OCT optical coherence tomography; IOP intraocular pressure; BRVO Branch retinal vein occlusion; CRVO central retinal vein occlusion; CRAO central retinal artery occlusion; BRAO branch retinal artery occlusion; RT retinal tear; SB scleral buckle; PPV pars plana vitrectomy; VH Vitreous hemorrhage; PRP panretinal laser photocoagulation; IVK intravitreal kenalog; VMT vitreomacular traction; MH Macular hole;  NVD neovascularization of the disc; NVE neovascularization elsewhere; AREDS age related eye disease study; ARMD age related macular degeneration; POAG primary open angle glaucoma; EBMD epithelial/anterior basement membrane dystrophy; ACIOL anterior chamber  intraocular lens; IOL intraocular lens; PCIOL posterior chamber intraocular lens; Phaco/IOL phacoemulsification with intraocular lens placement; [place] photorefractive keratectomy; LASIK laser assisted in situ keratomileusis; HTN hypertension; DM diabetes mellitus; COPD chronic obstructive pulmonary disease

## 2022-05-18 ENCOUNTER — Other Ambulatory Visit: Payer: Self-pay | Admitting: Internal Medicine

## 2022-05-18 DIAGNOSIS — Z794 Long term (current) use of insulin: Secondary | ICD-10-CM

## 2022-05-21 ENCOUNTER — Other Ambulatory Visit: Payer: Self-pay | Admitting: Internal Medicine

## 2022-05-21 DIAGNOSIS — I2510 Atherosclerotic heart disease of native coronary artery without angina pectoris: Secondary | ICD-10-CM

## 2022-05-21 DIAGNOSIS — I5022 Chronic systolic (congestive) heart failure: Secondary | ICD-10-CM

## 2022-05-21 DIAGNOSIS — I10 Essential (primary) hypertension: Secondary | ICD-10-CM

## 2022-05-29 ENCOUNTER — Ambulatory Visit (INDEPENDENT_AMBULATORY_CARE_PROVIDER_SITE_OTHER): Payer: Medicare Other | Admitting: Ophthalmology

## 2022-05-29 ENCOUNTER — Encounter (INDEPENDENT_AMBULATORY_CARE_PROVIDER_SITE_OTHER): Payer: Self-pay | Admitting: Ophthalmology

## 2022-05-29 DIAGNOSIS — Z961 Presence of intraocular lens: Secondary | ICD-10-CM | POA: Diagnosis not present

## 2022-05-29 DIAGNOSIS — E113313 Type 2 diabetes mellitus with moderate nonproliferative diabetic retinopathy with macular edema, bilateral: Secondary | ICD-10-CM | POA: Diagnosis not present

## 2022-05-29 DIAGNOSIS — I10 Essential (primary) hypertension: Secondary | ICD-10-CM

## 2022-05-29 DIAGNOSIS — H35033 Hypertensive retinopathy, bilateral: Secondary | ICD-10-CM

## 2022-05-29 MED ORDER — AFLIBERCEPT 2MG/0.05ML IZ SOLN FOR KALEIDOSCOPE
2.0000 mg | INTRAVITREAL | Status: AC | PRN
  Administered 2022-05-29: 2 mg via INTRAVITREAL

## 2022-06-04 ENCOUNTER — Other Ambulatory Visit: Payer: Self-pay | Admitting: Internal Medicine

## 2022-06-04 DIAGNOSIS — N1832 Chronic kidney disease, stage 3b: Secondary | ICD-10-CM

## 2022-06-04 DIAGNOSIS — I5022 Chronic systolic (congestive) heart failure: Secondary | ICD-10-CM

## 2022-06-04 DIAGNOSIS — Z794 Long term (current) use of insulin: Secondary | ICD-10-CM

## 2022-06-04 DIAGNOSIS — E1122 Type 2 diabetes mellitus with diabetic chronic kidney disease: Secondary | ICD-10-CM

## 2022-06-04 DIAGNOSIS — E118 Type 2 diabetes mellitus with unspecified complications: Secondary | ICD-10-CM

## 2022-06-04 MED ORDER — DAPAGLIFLOZIN PROPANEDIOL 10 MG PO TABS: 10.0000 mg | ORAL_TABLET | Freq: Every day | ORAL | 1 refills | Status: DC

## 2022-06-18 ENCOUNTER — Ambulatory Visit: Payer: Medicare Other | Attending: Cardiology

## 2022-06-18 DIAGNOSIS — I480 Paroxysmal atrial fibrillation: Secondary | ICD-10-CM | POA: Diagnosis not present

## 2022-06-18 DIAGNOSIS — Z5181 Encounter for therapeutic drug level monitoring: Secondary | ICD-10-CM | POA: Diagnosis not present

## 2022-06-18 LAB — POCT INR: INR: 2.4 (ref 2.0–3.0)

## 2022-06-18 NOTE — Patient Instructions (Signed)
Continue taking 1.5 tablets daily except for 1 tablet every Monday, Wednesday and Friday. Recheck INR in 6 weeks. Anticoagulation Clinic [phone]

## 2022-06-18 NOTE — Progress Notes (Signed)
[facility] Note  06/28/2022    CHIEF COMPLAINT Patient presents for Retina Follow Up  HISTORY OF PRESENT ILLNESS: William [name] is a 71 y.o. male who presents to the clinic today for:   HPI     Retina Follow Up   Patient presents with  Diabetic Retinopathy.  In both eyes.  Severity is moderate.  Duration of 4 weeks.  Since onset it is stable.  I, the attending physician,  performed the HPI with the patient and updated documentation appropriately.        Comments   Pt here for 4 wk ret f/u NPDR OU. Pt states VA the same, no changes or issues reported.       Last edited by [name], MD on 06/28/2022 11:59 AM.     Pt states no change in vision  Referring physician: [name], MD [place],  [place] [address]  HISTORICAL INFORMATION:  Selected notes from the MEDICAL RECORD NUMBER Referred by Dr. [name] for eval of DME OU LEE:  Ocular Hx- PMH-    CURRENT MEDICATIONS: No current outpatient medications on file. (Ophthalmic Drugs)   No current facility-administered medications for this visit. (Ophthalmic Drugs)   Current Outpatient Medications (Other)  Medication Sig   albuterol (PROVENTIL HFA;VENTOLIN HFA) 108 (90 Base) MCG/ACT inhaler Inhale 2 puffs into the lungs every 6 (six) hours as needed for wheezing or shortness of breath.   aspirin EC 81 MG tablet Take 1 tablet (81 mg total) by mouth daily.   carvedilol (COREG) 3.125 MG tablet TAKE 1 TABLET(3.125 MG) BY MOUTH TWICE DAILY WITH A MEAL   Continuous Blood Gluc Receiver (FREESTYLE LIBRE 2 READER) DEVI 1 Act by Does not apply route daily.   Continuous Blood Gluc Sensor (FREESTYLE LIBRE 2 SENSOR) MISC USE TO CHECK BLOOD SUGAR AND CHANGE EVERY 14 DAYS   dapagliflozin propanediol (FARXIGA) 10 MG TABS tablet Take 1 tablet (10 mg total) by mouth daily.   gabapentin (NEURONTIN) 300 MG capsule TAKE 1 CAPSULE(300 MG) BY MOUTH TWICE DAILY   Glucagon (GVOKE HYPOPEN 2-PACK) 1  MG/0.2ML SOAJ Inject 1 Act into the skin daily as needed.   HUMALOG KWIKPEN 100 UNIT/ML KwikPen INJECT 10 UNITS UNDER THE SKIN THREE TIMES DAILY AS DIRECTED BY SLIDING SCALE (Patient taking differently: Inject 10-45 Units into the skin 3 (three) times daily. Pt takes with meals)   insulin glargine (LANTUS SOLOSTAR) 100 UNIT/ML Solostar Pen INJECT 55 UNITS UNDER THE SKIN DAILY (Patient taking differently: INJECT 55 UNITS UNDER THE SKIN DAILY PT TAKES IN THE PM)   Insulin Pen Needle 31G X 8 MM MISC 1 each by Does not apply route 4 (four) times daily.   pantoprazole (PROTONIX) 40 MG tablet TAKE 1 TABLET(40 MG) BY MOUTH TWICE DAILY BEFORE A MEAL   pramipexole (MIRAPEX) 0.125 MG tablet TAKE 1 TABLET(0.125 MG) BY MOUTH AT BEDTIME   rosuvastatin (CRESTOR) 40 MG tablet TAKE 1 TABLET BY MOUTH EVERY DAY   torsemide (DEMADEX) 20 MG tablet TAKE 1 TABLET(20 MG) BY MOUTH 3 TIMES A WEEK Monday, Wednesday and Friday   warfarin (COUMADIN) 5 MG tablet TAKE 1 TO 2 TABLETS BY MOUTH DAILY AS DIRECTED BY COUMADIN CLINIC   No current facility-administered medications for this visit. (Other)   REVIEW OF SYSTEMS: ROS   Positive for: Endocrine, Eyes Negative for: Constitutional, Gastrointestinal, Neurological, Skin, Genitourinary, Musculoskeletal, HENT, Cardiovascular, Respiratory, Psychiatric, Allergic/Imm, Heme/Lymph Last edited by Kingsley Spittle, COT on  06/28/2022  7:58 AM.     ALLERGIES Allergies  Allergen Reactions   Morphine And Related Shortness Of Breath and Other (See Comments)    UNSPECIFIED REACTION "Pt said it was too much"    Latex Rash   PAST MEDICAL HISTORY Past Medical History:  Diagnosis Date   AKI (acute kidney injury) ([place])    With STEMI in 9179   Chronic systolic CHF (congestive heart failure) ([place])    Depression    Diabetes mellitus without complication ([name])    Diabetic retinopathy ([name])    GERD (gastroesophageal reflux disease)    Hx of adenomatous colonic polyps 04/07/2018    Hyperlipidemia    Hypertension    Hypertensive retinopathy    Paroxysmal atrial fibrillation ([name])    Peripheral vascular disease ([name])    Pneumonia    Seizures ([name])    hx of as a child   STEMI (ST elevation myocardial infarction) ([place] [place]) 2017   Past Surgical History:  Procedure Laterality Date   ABDOMINAL AORTOGRAM W/LOWER EXTREMITY N/A 09/19/2016   Procedure: Abdominal Aortogram w/Lower Extremity;  Surgeon: Wellington Hampshire, MD;  Location: [facility];  Service: Cardiovascular;  Laterality: N/A;   AMPUTATION Right 03/23/2016   Procedure: 1st and 2nd Ray Amputation Right Foot;  Surgeon: Newt Minion, MD;  Location: [facility];  Service: Orthopedics;  Laterality: Right;   AMPUTATION Right 06/21/2016   Procedure: RIGHT TRANSMETATARSAL AMPUTATION;  Surgeon: Newt Minion, MD;  Location: [facility];  Service: Orthopedics;  Laterality: Right;   CARDIAC CATHETERIZATION N/A 03/13/2016   Procedure: Right/Left Heart Cath and Coronary Angiography;  Surgeon: Sherren Mocha, MD;  Location: [facility];  Service: Cardiovascular;  Laterality: N/A;   CARDIAC CATHETERIZATION N/A 03/13/2016   Procedure: IABP Insertion;  Surgeon: Sherren Mocha, MD;  Location: [facility];  Service: Cardiovascular;  Laterality: N/A;   CATARACT EXTRACTION     CATARACT EXTRACTION, BILATERAL     CERVICAL FUSION  1982, 1992   has had 3 neck surgeries from breaking his neck   CIRCUMCISION N/A 11/07/2021   Procedure: CIRCUMCISION ADULT;  Surgeon: Vira Agar, MD;  Location: [facility];  Service: Urology;  Laterality: N/A;   CORONARY ARTERY BYPASS GRAFT N/A 03/13/2016   Procedure: CORONARY ARTERY BYPASS GRAFTING (CABG) x 1 (SVG to OM) with EVH from [place];  Surgeon: Ivin Poot, MD;  Location: [facility];  Service: Open Heart Surgery;  Laterality: N/A;   EYE SURGERY     LOWER EXTREMITY ANGIOGRAM  05/02/2016   Procedure: Lower Extremity Angiogram;  Surgeon: Wellington Hampshire, MD;  Location: [facility];  Service: Cardiovascular;;  Limited left femoral runoff right femoral runoff   PERIPHERAL VASCULAR CATHETERIZATION N/A 05/02/2016   Procedure: Abdominal Aortogram;  Surgeon: Wellington Hampshire, MD;  Location: [facility];  Service: Cardiovascular;  Laterality: N/A;   PERIPHERAL VASCULAR CATHETERIZATION Right 05/02/2016   Procedure: Peripheral Vascular Balloon Angioplasty;  Surgeon: Wellington Hampshire, MD;  Location: [facility];  Service: Cardiovascular;  Laterality: Right;  SFA   TEE WITHOUT CARDIOVERSION N/A 03/13/2016   Procedure: TRANSESOPHAGEAL ECHOCARDIOGRAM (TEE);  Surgeon: Ivin Poot, MD;  Location: [facility];  Service: Open Heart Surgery;  Laterality: N/A;   VSD REPAIR N/A 03/13/2016   Procedure: VENTRICULAR SEPTAL DEFECT (VSD) REPAIR;  Surgeon: Ivin Poot, MD;  Location: [facility];  Service: Open Heart Surgery;  Laterality: N/A;   FAMILY HISTORY Family  History  Problem Relation Age of Onset   Diabetes Maternal Grandmother    Diabetes Mother    Aneurysm Mother    Peripheral Artery Disease Mother    Coronary artery disease Mother    Peptic Ulcer Father    Retinoblastoma Daughter    Colon cancer Neg Hx    Rectal cancer Neg Hx    SOCIAL HISTORY Social History   Tobacco Use   Smoking status: Former    Packs/day: 0.50    Types: Cigarettes    Quit date: 11/20/2015    Years since quitting: 6.6   Smokeless tobacco: Never   Tobacco comments:    quit 2018  Vaping Use   Vaping Use: Never used  Substance Use Topics   Alcohol use: No   Drug use: No       OPHTHALMIC EXAM: Base Eye Exam     Visual Acuity (Snellen - Linear)       Right Left   Dist [place] 20/40 20/25 +1   Dist ph [place] 20/30 NI         Tonometry (Tonopen, 8:06 AM)       Right Left   Pressure 9 10         Pupils       Pupils Dark Light Shape React APD   Right PERRL 2 1 Round Brisk None   Left PERRL 2 1 Round Brisk None         Visual Fields (Counting fingers)        Left Right    Full Full         Extraocular Movement       Right Left    Full, Ortho Full, Ortho         Neuro/Psych     Oriented x3: Yes   Mood/Affect: Normal         Dilation     Both eyes: 1.0% Mydriacyl, 2.5% Phenylephrine @ 8:07 AM           Slit Lamp and Fundus Exam     Slit Lamp Exam       Right Left   Lids/Lashes Dermatochalasis - upper lid, mild MGD Dermatochalasis - upper lid, mild MGD   Conjunctiva/Sclera White and quiet White and quiet   Cornea trace PEE trace PEE   Anterior Chamber Deep and quiet Deep and quiet   Iris Round and dilated, No NVI Round and dilated, No NVI   Lens PC IOL in good position PC IOL in good position   Anterior Vitreous Vitreous syneresis, vitreous condensations Vitreous syneresis, Posterior vitreous detachment, vitreous condensations         Fundus Exam       Right Left   Disc Pink and Sharp mild Pallor, Sharp rim, mild tilt   C/D Ratio 0.3 0.3   Macula Flat, Blunted foveal reflex, scatted Microaneurysms/DBH - improved, scattered Cystic changes/edema greatest superior mac -- slightly improved Blunted foveal reflex, central IRF/edema - slightly improved, +MA/DBH -- improved   Vessels attenuated, mild tortuosity attenuated, Tortuous   Periphery Attached, +MA greatest posteriorly Attached, scattered MA greatest posteriorly           Refraction     Manifest Refraction       Sphere Cylinder Axis Dist VA   Right +0.50 +0.75 015 20/20   Left               IMAGING AND PROCEDURES  Imaging and Procedures for 06/28/2022  OCT, Retina -  OU - Both Eyes       Right Eye Quality was good. Central Foveal Thickness: 280. Progression has improved. Findings include no SRF, abnormal foveal contour, intraretinal hyper-reflective material, intraretinal fluid, vitreomacular adhesion (Mild interval improvement in central IRF ).   Left Eye Quality was good. Central Foveal Thickness: 262. Progression has improved. Findings  include no SRF, abnormal foveal contour, intraretinal hyper-reflective material, intraretinal fluid (Mild interval improvement in cystic changes temporal macula and centrally).   Notes *Images captured and stored on drive  Diagnosis / Impression:  +DME OU OD: Mild interval improvement in central IRF  OS: Mild interval improvement in cystic changes temporal macula and centrally  Clinical management:  See below  Abbreviations: NFP - Normal foveal profile. CME - cystoid macular edema. PED - pigment epithelial detachment. IRF - intraretinal fluid. SRF - subretinal fluid. EZ - ellipsoid zone. ERM - epiretinal membrane. ORA - outer retinal atrophy. ORT - outer retinal tubulation. SRHM - subretinal hyper-reflective material. IRHM - intraretinal hyper-reflective material      Intravitreal Injection, Pharmacologic Agent - OD - Right Eye       Time Out 06/28/2022. 8:22 AM. Confirmed correct patient, procedure, site, and patient consented.   Anesthesia Topical anesthesia was used. Anesthetic medications included Lidocaine 2%, Proparacaine 0.5%.   Procedure Preparation included 5% betadine to ocular surface, eyelid speculum. A (32g) needle was used.   Injection: 2 mg aflibercept 2 MG/0.05ML   Route: Intravitreal, Site: Right Eye   NDC: A3590391, Lot: [id number], Expiration date: 08/30/2023, Waste: 0 mL   Post-op Post injection exam found visual acuity of at least counting fingers. The patient tolerated the procedure well. There were no complications. The patient received written and verbal post procedure care education. Post injection medications were not given.      Intravitreal Injection, Pharmacologic Agent - OS - Left Eye       Time Out 06/28/2022. 8:23 AM. Confirmed correct patient, procedure, site, and patient consented.   Anesthesia Topical anesthesia was used. Anesthetic medications included Lidocaine 2%, Proparacaine 0.5%.   Procedure Preparation included 5% betadine  to ocular surface, eyelid speculum. A (32g) needle was used.   Injection: 2 mg aflibercept 2 MG/0.05ML   Route: Intravitreal, Site: Left Eye   NDC: A3590391, Lot: [id number], Expiration date: 09/29/2023, Waste: 0 mL   Post-op Post injection exam found visual acuity of at least counting fingers. The patient tolerated the procedure well. There were no complications. The patient received written and verbal post procedure care education. Post injection medications were not given.            ASSESSMENT/PLAN:    ICD-10-CM   1. Moderate nonproliferative diabetic retinopathy of both eyes with macular edema associated with type 2 diabetes mellitus ([name])  E11.3313 OCT, Retina - OU - Both Eyes    Intravitreal Injection, Pharmacologic Agent - OD - Right Eye    Intravitreal Injection, Pharmacologic Agent - OS - Left Eye    aflibercept (EYLEA) SOLN 2 mg    aflibercept (EYLEA) SOLN 2 mg    2. Essential hypertension  I10     3. Hypertensive retinopathy of both eyes  H35.033     4. Pseudophakia, both eyes  Z96.1      1. Moderate non-proliferative diabetic retinopathy, both eyes  - s/p IVA OD #1 (11.14.22), #2 (12.12.22), #3 (01.09.23) -- IVA resistance - s/p IVA OS #1 (10.17.22), #2 (11.14.22), #3 (12.12.22), #4 (01.09.23), #5 (03.15.23), #6 (04.12.23) -- IVA resistance - s/p IVE  OD #1 (03.15.23), #2 (04.12.23), #3 (05.10.23), #4 (06.07.23), #5 (07.05.23), #6 (08.02.23), #7 (08.31.23), #8 (09.28.23), #9 (10.30.23), #10 (11.28.23) - s/p IVE OS #1 (05.10.23), #2 (06.07.23), #3 (07.05.23), #4 (08.02.23), #5 (08.31.23), #6 (09.28.23), #7 (10.30.23), #8 (11.28.23) - exam shows scattered MA, DBH OU - FA (10.17.22) shows late leaking MA OU, no NV OU - OCT shows OD: Mild interval improvement in central IRF; OS: Mild interval improvement in cystic changes temporal macula and centrally at 4 wks - discussed possibility of elevated BP blunting efficacy of IVE - BCVA 20/30 OD, 20/25 OS -- both  decreased - recommend IVE OD #11 and IVE OS #9 today, 12.28.23 for DME with follow up in 4-5 weeks - pt wishes to proceed - RBA of procedure discussed, questions answered - IVE informed consent obtained and signed, 03.15.23 (OD) - IVE informed consent obtained and signed, 05.10.23 (OS) - see procedure note - f/u in 4-5 wks -- DFE/OCT, possible injection(s)  2,3. Hypertensive retinopathy OU - discussed importance of tight BP control and possibility of elevated BP blunting efficacy of IVE - monitor  4. Pseudophakia OU  - s/p CE/IOL OU  - IOL in good position, doing well  - monitor  Ophthalmic Meds Ordered this visit:  Meds ordered this encounter  Medications   aflibercept (EYLEA) SOLN 2 mg   aflibercept (EYLEA) SOLN 2 mg     Return for f/u 4-5 weeks, NPDR OU, DFE, OCT.  There are no Patient Instructions on file for this visit.  Explained the diagnoses, plan, and follow up with the patient and they expressed understanding.  Patient expressed understanding of the importance of proper follow up care.   This document serves as a record of services personally performed by Gardiner Sleeper, MD, PhD. It was created on their behalf by San Jetty. Owens Shark, OA an ophthalmic technician. The creation of this record is the provider's dictation and/or activities during the visit.    Electronically signed by: San Jetty. Owens Shark, [place] 12.18.2023 12:00 PM  Gardiner Sleeper, M.D., Ph.D. Diseases & Surgery of the Retina and Vitreous [facility]  I have reviewed the above documentation for accuracy and completeness, and I agree with the above. Gardiner Sleeper, M.D., Ph.D. 06/28/22 12:00 PM  Abbreviations: M myopia (nearsighted); A astigmatism; H hyperopia (farsighted); P presbyopia; Mrx spectacle prescription;  CTL contact lenses; OD right eye; OS left eye; OU both eyes  XT exotropia; ET esotropia; PEK punctate epithelial keratitis; PEE punctate epithelial erosions; DES dry eye  syndrome; MGD meibomian gland dysfunction; ATs artificial tears; PFAT's preservative free artificial tears; [place] nuclear sclerotic cataract; PSC posterior subcapsular cataract; ERM epi-retinal membrane; PVD posterior vitreous detachment; RD retinal detachment; DM diabetes mellitus; DR diabetic retinopathy; NPDR non-proliferative diabetic retinopathy; PDR proliferative diabetic retinopathy; CSME clinically significant macular edema; DME diabetic macular edema; dbh dot blot hemorrhages; CWS cotton wool spot; POAG primary open angle glaucoma; C/D cup-to-disc ratio; HVF humphrey visual field; GVF goldmann visual field; OCT optical coherence tomography; IOP intraocular pressure; BRVO Branch retinal vein occlusion; CRVO central retinal vein occlusion; CRAO central retinal artery occlusion; BRAO branch retinal artery occlusion; RT retinal tear; SB scleral buckle; PPV pars plana vitrectomy; VH Vitreous hemorrhage; PRP panretinal laser photocoagulation; IVK intravitreal kenalog; VMT vitreomacular traction; MH Macular hole;  NVD neovascularization of the disc; NVE neovascularization elsewhere; AREDS age related eye disease study; ARMD age related macular degeneration; POAG primary open angle glaucoma; EBMD epithelial/anterior basement membrane dystrophy; ACIOL anterior chamber intraocular  lens; IOL intraocular lens; PCIOL posterior chamber intraocular lens; Phaco/IOL phacoemulsification with intraocular lens placement; [place] photorefractive keratectomy; LASIK laser assisted in situ keratomileusis; HTN hypertension; DM diabetes mellitus; COPD chronic obstructive pulmonary disease

## 2022-06-28 ENCOUNTER — Ambulatory Visit (INDEPENDENT_AMBULATORY_CARE_PROVIDER_SITE_OTHER): Payer: Medicare Other | Admitting: Ophthalmology

## 2022-06-28 ENCOUNTER — Encounter (INDEPENDENT_AMBULATORY_CARE_PROVIDER_SITE_OTHER): Payer: Self-pay | Admitting: Ophthalmology

## 2022-06-28 DIAGNOSIS — H35033 Hypertensive retinopathy, bilateral: Secondary | ICD-10-CM | POA: Diagnosis not present

## 2022-06-28 DIAGNOSIS — E113313 Type 2 diabetes mellitus with moderate nonproliferative diabetic retinopathy with macular edema, bilateral: Secondary | ICD-10-CM | POA: Diagnosis not present

## 2022-06-28 DIAGNOSIS — I10 Essential (primary) hypertension: Secondary | ICD-10-CM

## 2022-06-28 DIAGNOSIS — Z961 Presence of intraocular lens: Secondary | ICD-10-CM | POA: Diagnosis not present

## 2022-06-28 MED ORDER — AFLIBERCEPT 2MG/0.05ML IZ SOLN FOR KALEIDOSCOPE
2.0000 mg | INTRAVITREAL | Status: AC | PRN
  Administered 2022-06-28: 2 mg via INTRAVITREAL

## 2022-06-30 ENCOUNTER — Other Ambulatory Visit: Payer: Self-pay | Admitting: Internal Medicine

## 2022-06-30 DIAGNOSIS — E119 Type 2 diabetes mellitus without complications: Secondary | ICD-10-CM

## 2022-07-24 NOTE — Progress Notes (Signed)
[facility] Note  07/30/2022    CHIEF COMPLAINT Patient presents for Retina Follow Up  HISTORY OF PRESENT ILLNESS: William [name] is a 72 y.o. male who presents to the clinic today for:   HPI     Retina Follow Up   Patient presents with  Diabetic Retinopathy.  In both eyes.  This started 4.5 weeks ago.  I, the attending physician,  performed the HPI with the patient and updated documentation appropriately.        Comments   Patient here for 4.5 weeks retina follow up for NPDR OU. Patient states vision doing good. The same. Sometimes has eye pain.       Last edited by [name], MD on 07/30/2022  8:40 AM.    Pt states VA is stable  Referring physician: [name], MD [place],  [place] [id number]  HISTORICAL INFORMATION:  Selected notes from the MEDICAL RECORD NUMBER Referred by Dr. [name] for eval of DME OU LEE:  Ocular Hx- PMH-    CURRENT MEDICATIONS: No current outpatient medications on file. (Ophthalmic Drugs)   No current facility-administered medications for this visit. (Ophthalmic Drugs)   Current Outpatient Medications (Other)  Medication Sig   albuterol (PROVENTIL HFA;VENTOLIN HFA) 108 (90 Base) MCG/ACT inhaler Inhale 2 puffs into the lungs every 6 (six) hours as needed for wheezing or shortness of breath.   aspirin EC 81 MG tablet Take 1 tablet (81 mg total) by mouth daily.   carvedilol (COREG) 3.125 MG tablet TAKE 1 TABLET(3.125 MG) BY MOUTH TWICE DAILY WITH A MEAL   Continuous Blood Gluc Receiver (FREESTYLE LIBRE 2 READER) DEVI 1 Act by Does not apply route daily.   Continuous Blood Gluc Sensor (FREESTYLE LIBRE 2 SENSOR) MISC USE TO CHECK BLOOD SUGAR AND CHANGE EVERY 14 DAYS   dapagliflozin propanediol (FARXIGA) 10 MG TABS tablet Take 1 tablet (10 mg total) by mouth daily.   gabapentin (NEURONTIN) 300 MG capsule TAKE 1 CAPSULE(300 MG) BY MOUTH TWICE DAILY   Glucagon (GVOKE HYPOPEN 2-PACK) 1 MG/0.2ML SOAJ  Inject 1 Act into the skin daily as needed.   HUMALOG KWIKPEN 100 UNIT/ML KwikPen INJECT 10 UNITS UNDER THE SKIN THREE TIMES DAILY AS DIRECTED BY SLIDING SCALE (Patient taking differently: Inject 10-45 Units into the skin 3 (three) times daily. Pt takes with meals)   insulin glargine (LANTUS SOLOSTAR) 100 UNIT/ML Solostar Pen INJECT 55 UNITS UNDER THE SKIN DAILY (Patient taking differently: INJECT 55 UNITS UNDER THE SKIN DAILY PT TAKES IN THE PM)   Insulin Pen Needle 31G X 8 MM MISC 1 each by Does not apply route 4 (four) times daily.   pantoprazole (PROTONIX) 40 MG tablet TAKE 1 TABLET(40 MG) BY MOUTH TWICE DAILY BEFORE A MEAL   pramipexole (MIRAPEX) 0.125 MG tablet TAKE 1 TABLET(0.125 MG) BY MOUTH AT BEDTIME   rosuvastatin (CRESTOR) 40 MG tablet TAKE 1 TABLET BY MOUTH EVERY DAY   torsemide (DEMADEX) 20 MG tablet TAKE 1 TABLET(20 MG) BY MOUTH 3 TIMES A WEEK Monday, Wednesday and Friday   warfarin (COUMADIN) 5 MG tablet TAKE 1 TO 2 TABLETS BY MOUTH DAILY AS DIRECTED BY COUMADIN CLINIC   No current facility-administered medications for this visit. (Other)   REVIEW OF SYSTEMS: ROS   Positive for: Endocrine, Eyes Negative for: Constitutional, Gastrointestinal, Neurological, Skin, Genitourinary, Musculoskeletal, HENT, Cardiovascular, Respiratory, Psychiatric, Allergic/Imm, Heme/Lymph Last edited by [name], COA on 07/30/2022  8:04 AM.  ALLERGIES Allergies  Allergen Reactions   Morphine And Related Shortness Of Breath and Other (See Comments)    UNSPECIFIED REACTION "Pt said it was too much"    Latex Rash   PAST MEDICAL HISTORY Past Medical History:  Diagnosis Date   AKI (acute kidney injury) ([place])    With STEMI in 9798   Chronic systolic CHF (congestive heart failure) ([place])    Depression    Diabetes mellitus without complication ([name])    Diabetic retinopathy ([name])    GERD (gastroesophageal reflux disease)    Hx of adenomatous colonic polyps 04/07/2018   Hyperlipidemia     Hypertension    Hypertensive retinopathy    Paroxysmal atrial fibrillation ([name])    Peripheral vascular disease ([name])    Pneumonia    Seizures ([name])    hx of as a child   STEMI (ST elevation myocardial infarction) ([place]) 2017   Past Surgical History:  Procedure Laterality Date   ABDOMINAL AORTOGRAM W/LOWER EXTREMITY N/A 09/19/2016   Procedure: Abdominal Aortogram w/Lower Extremity;  Surgeon: Wellington Hampshire, MD;  Location: [facility];  Service: Cardiovascular;  Laterality: N/A;   AMPUTATION Right 03/23/2016   Procedure: 1st and 2nd Ray Amputation Right Foot;  Surgeon: Newt Minion, MD;  Location: [facility];  Service: Orthopedics;  Laterality: Right;   AMPUTATION Right 06/21/2016   Procedure: RIGHT TRANSMETATARSAL AMPUTATION;  Surgeon: Newt Minion, MD;  Location: [facility];  Service: Orthopedics;  Laterality: Right;   CARDIAC CATHETERIZATION N/A 03/13/2016   Procedure: Right/Left Heart Cath and Coronary Angiography;  Surgeon: Sherren Mocha, MD;  Location: [facility];  Service: Cardiovascular;  Laterality: N/A;   CARDIAC CATHETERIZATION N/A 03/13/2016   Procedure: IABP Insertion;  Surgeon: Sherren Mocha, MD;  Location: [facility];  Service: Cardiovascular;  Laterality: N/A;   CATARACT EXTRACTION     CATARACT EXTRACTION, BILATERAL     CERVICAL FUSION  1982, 1992   has had 3 neck surgeries from breaking his neck   CIRCUMCISION N/A 11/07/2021   Procedure: CIRCUMCISION ADULT;  Surgeon: Vira Agar, MD;  Location: [facility];  Service: Urology;  Laterality: N/A;   CORONARY ARTERY BYPASS GRAFT N/A 03/13/2016   Procedure: CORONARY ARTERY BYPASS GRAFTING (CABG) x 1 (SVG to OM) with EVH from [place];  Surgeon: Ivin Poot, MD;  Location: [facility];  Service: Open Heart Surgery;  Laterality: N/A;   EYE SURGERY     LOWER EXTREMITY ANGIOGRAM  05/02/2016   Procedure: Lower Extremity Angiogram;  Surgeon: Wellington Hampshire, MD;  Location: [facility];   Service: Cardiovascular;;  Limited left femoral runoff right femoral runoff   PERIPHERAL VASCULAR CATHETERIZATION N/A 05/02/2016   Procedure: Abdominal Aortogram;  Surgeon: Wellington Hampshire, MD;  Location: [facility];  Service: Cardiovascular;  Laterality: N/A;   PERIPHERAL VASCULAR CATHETERIZATION Right 05/02/2016   Procedure: Peripheral Vascular Balloon Angioplasty;  Surgeon: Wellington Hampshire, MD;  Location: [facility];  Service: Cardiovascular;  Laterality: Right;  SFA   TEE WITHOUT CARDIOVERSION N/A 03/13/2016   Procedure: TRANSESOPHAGEAL ECHOCARDIOGRAM (TEE);  Surgeon: Ivin Poot, MD;  Location: [facility];  Service: Open Heart Surgery;  Laterality: N/A;   VSD REPAIR N/A 03/13/2016   Procedure: VENTRICULAR SEPTAL DEFECT (VSD) REPAIR;  Surgeon: Ivin Poot, MD;  Location: [facility];  Service: Open Heart Surgery;  Laterality: N/A;   FAMILY HISTORY Family History  Problem Relation Age of Onset  Diabetes Maternal Grandmother    Diabetes Mother    Aneurysm Mother    Peripheral Artery Disease Mother    Coronary artery disease Mother    Peptic Ulcer Father    Retinoblastoma Daughter    Colon cancer Neg Hx    Rectal cancer Neg Hx    SOCIAL HISTORY Social History   Tobacco Use   Smoking status: Former    Packs/day: 0.50    Types: Cigarettes    Quit date: 11/20/2015    Years since quitting: 6.6   Smokeless tobacco: Never   Tobacco comments:    quit 2018  Vaping Use   Vaping Use: Never used  Substance Use Topics   Alcohol use: No   Drug use: No       OPHTHALMIC EXAM: Base Eye Exam     Visual Acuity (Snellen - Linear)       Right Left   Dist Lane 20/30 20/25   Dist ph  20/25 -2 20/20         Tonometry (Tonopen, 8:01 AM)       Right Left   Pressure 14 11         Pupils       Dark Light Shape React APD   Right 2 1 Round Brisk None   Left 2 1 Round Brisk None         Visual Fields (Counting fingers)       Left Right    Full Full          Extraocular Movement       Right Left    Full, Ortho Full, Ortho         Neuro/Psych     Oriented x3: Yes   Mood/Affect: Normal         Dilation     Both eyes: 1.0% Mydriacyl, 2.5% Phenylephrine @ 8:01 AM           Slit Lamp and Fundus Exam     Slit Lamp Exam       Right Left   Lids/Lashes Dermatochalasis - upper lid, mild MGD Dermatochalasis - upper lid, mild MGD   Conjunctiva/Sclera White and quiet White and quiet   Cornea trace PEE trace PEE   Anterior Chamber Deep and quiet Deep and quiet   Iris Round and dilated, No NVI Round and dilated, No NVI   Lens PC IOL in good position PC IOL in good position   Anterior Vitreous Vitreous syneresis, vitreous condensations Vitreous syneresis, Posterior vitreous detachment, vitreous condensations         Fundus Exam       Right Left   Disc Pink and Sharp mild Pallor, Sharp rim, mild tilt   C/D Ratio 0.3 0.3   Macula Flat, good foveal reflex, scatted Microaneurysms/DBH - improved, scattered Cystic changes/edema greatest superior mac -- slightly improved good foveal reflex, central IRF/edema - slightly improved, +MA/DBH -- improved   Vessels attenuated, mild tortuosity attenuated, Tortuous   Periphery Attached, +MA greatest posteriorly Attached, scattered MA greatest posteriorly           IMAGING AND PROCEDURES  Imaging and Procedures for 07/30/2022  OCT, Retina - OU - Both Eyes       Right Eye Quality was good. Central Foveal Thickness: 274. Progression has improved. Findings include no SRF, abnormal foveal contour, intraretinal hyper-reflective material, intraretinal fluid, vitreomacular adhesion (Mild interval improvement in central cystic changes, mild interval increase superiorly).   Left Eye Quality was good.  Central Foveal Thickness: 253. Progression has improved. Findings include no SRF, abnormal foveal contour, intraretinal hyper-reflective material, intraretinal fluid (Persistent cystic changes  ST macula -- slightly improved).   Notes *Images captured and stored on drive  Diagnosis / Impression:  +DME OU OD: Mild interval improvement in central cystic changes, mild interval increase superiorly OS: Persistent cystic changes ST macula -- slightly improved  Clinical management:  See below  Abbreviations: NFP - Normal foveal profile. CME - cystoid macular edema. PED - pigment epithelial detachment. IRF - intraretinal fluid. SRF - subretinal fluid. EZ - ellipsoid zone. ERM - epiretinal membrane. ORA - outer retinal atrophy. ORT - outer retinal tubulation. SRHM - subretinal hyper-reflective material. IRHM - intraretinal hyper-reflective material      Intravitreal Injection, Pharmacologic Agent - OD - Right Eye       Time Out 07/30/2022. 8:05 AM. Confirmed correct patient, procedure, site, and patient consented.   Anesthesia Topical anesthesia was used. Anesthetic medications included Lidocaine 2%, Proparacaine 0.5%.   Procedure Preparation included 5% betadine to ocular surface, eyelid speculum. A (32g) needle was used.   Injection: 2 mg aflibercept 2 MG/0.05ML   Route: Intravitreal, Site: Right Eye   NDC: A3590391, Lot: [id number], Expiration date: 09/29/2023, Waste: 0 mL   Post-op Post injection exam found visual acuity of at least counting fingers. The patient tolerated the procedure well. There were no complications. The patient received written and verbal post procedure care education. Post injection medications were not given.      Intravitreal Injection, Pharmacologic Agent - OS - Left Eye       Time Out 07/30/2022. 8:05 AM. Confirmed correct patient, procedure, site, and patient consented.   Anesthesia Topical anesthesia was used. Anesthetic medications included Lidocaine 2%, Proparacaine 0.5%.   Procedure Preparation included 5% betadine to ocular surface, eyelid speculum. A (32g) needle was used.   Injection: 2 mg aflibercept 2 MG/0.05ML   Route:  Intravitreal, Site: Left Eye   NDC: A3590391, Lot: [id number], Expiration date: 10/30/2022, Waste: 0 mL   Post-op Post injection exam found visual acuity of at least counting fingers. The patient tolerated the procedure well. There were no complications. The patient received written and verbal post procedure care education. Post injection medications were not given.            ASSESSMENT/PLAN:    ICD-10-CM   1. Moderate nonproliferative diabetic retinopathy of both eyes with macular edema associated with type 2 diabetes mellitus ([name])  E11.3313 OCT, Retina - OU - Both Eyes    Intravitreal Injection, Pharmacologic Agent - OD - Right Eye    Intravitreal Injection, Pharmacologic Agent - OS - Left Eye    aflibercept (EYLEA) SOLN 2 mg    aflibercept (EYLEA) SOLN 2 mg    2. Essential hypertension  I10     3. Hypertensive retinopathy of both eyes  H35.033     4. Pseudophakia, both eyes  Z96.1      1. Moderate non-proliferative diabetic retinopathy, both eyes  - s/p IVA OD #1 (11.14.22), #2 (12.12.22), #3 (01.09.23) -- IVA resistance - s/p IVA OS #1 (10.17.22), #2 (11.14.22), #3 (12.12.22), #4 (01.09.23), #5 (03.15.23), #6 (04.12.23) -- IVA resistance - s/p IVE OD #1 (03.15.23), #2 (04.12.23), #3 (05.10.23), #4 (06.07.23), #5 (07.05.23), #6 (08.02.23), #7 (08.31.23), #8 (09.28.23), #9 (10.30.23), #10 (11.28.23), #11 (12.28.23) - s/p IVE OS #1 (05.10.23), #2 (06.07.23), #3 (07.05.23), #4 (08.02.23), #5 (08.31.23), #6 (09.28.23), #7 (10.30.23), #8 (11.28.23), #9 (12.28.23) - exam shows scattered MA,  DBH OU - FA (10.17.22) shows late leaking MA OU, no NV OU - OCT shows OD: Mild interval improvement in central IRF; OS: Persistent cystic changes ST macula -- slightly improved at 4+ wks - discussed possibility of elevated BP blunting efficacy of IVE - BCVA 20/25 OD, 20/20 OS -- both improved - recommend IVE OD #12 and IVE OS #10 today, 01.29.24 for DME with follow up in 5 weeks - pt wishes  to proceed - RBA of procedure discussed, questions answered - IVE informed consent obtained and signed, 03.15.23 (OD) - IVE informed consent obtained and signed, 05.10.23 (OS) - see procedure note - f/u in 5 wks -- DFE/OCT, possible injection(s)  2,3. Hypertensive retinopathy OU - discussed importance of tight BP control and possibility of elevated BP blunting efficacy of IVE - monitor  4. Pseudophakia OU  - s/p CE/IOL OU  - IOL in good position, doing well  - monitor  Ophthalmic Meds Ordered this visit:  Meds ordered this encounter  Medications   aflibercept (EYLEA) SOLN 2 mg   aflibercept (EYLEA) SOLN 2 mg     Return in 5 weeks (on 09/03/2022) for NPDR OU, DFE, OCT.  There are no Patient Instructions on file for this visit.  Explained the diagnoses, plan, and follow up with the patient and they expressed understanding.  Patient expressed understanding of the importance of proper follow up care.   This document serves as a record of services personally performed by Gardiner Sleeper, MD, PhD. It was created on their behalf by San Jetty. Owens Shark, OA an ophthalmic technician. The creation of this record is the provider's dictation and/or activities during the visit.    Electronically signed by: San Jetty. Owens Shark, [place] 01.23.2024 8:51 AM   Gardiner Sleeper, M.D., Ph.D. Diseases & Surgery of the Retina and Vitreous [facility]  I have reviewed the above documentation for accuracy and completeness, and I agree with the above. Gardiner Sleeper, M.D., Ph.D. 07/30/22 8:51 AM   Abbreviations: M myopia (nearsighted); A astigmatism; H hyperopia (farsighted); P presbyopia; Mrx spectacle prescription;  CTL contact lenses; OD right eye; OS left eye; OU both eyes  XT exotropia; ET esotropia; PEK punctate epithelial keratitis; PEE punctate epithelial erosions; DES dry eye syndrome; MGD meibomian gland dysfunction; ATs artificial tears; PFAT's preservative free artificial tears;  [place] nuclear sclerotic cataract; PSC posterior subcapsular cataract; ERM epi-retinal membrane; PVD posterior vitreous detachment; RD retinal detachment; DM diabetes mellitus; DR diabetic retinopathy; NPDR non-proliferative diabetic retinopathy; PDR proliferative diabetic retinopathy; CSME clinically significant macular edema; DME diabetic macular edema; dbh dot blot hemorrhages; CWS cotton wool spot; POAG primary open angle glaucoma; C/D cup-to-disc ratio; HVF humphrey visual field; GVF goldmann visual field; OCT optical coherence tomography; IOP intraocular pressure; BRVO Branch retinal vein occlusion; CRVO central retinal vein occlusion; CRAO central retinal artery occlusion; BRAO branch retinal artery occlusion; RT retinal tear; SB scleral buckle; PPV pars plana vitrectomy; VH Vitreous hemorrhage; PRP panretinal laser photocoagulation; IVK intravitreal kenalog; VMT vitreomacular traction; MH Macular hole;  NVD neovascularization of the disc; NVE neovascularization elsewhere; AREDS age related eye disease study; ARMD age related macular degeneration; POAG primary open angle glaucoma; EBMD epithelial/anterior basement membrane dystrophy; ACIOL anterior chamber intraocular lens; IOL intraocular lens; PCIOL posterior chamber intraocular lens; Phaco/IOL phacoemulsification with intraocular lens placement; [place] photorefractive keratectomy; LASIK laser assisted in situ keratomileusis; HTN hypertension; DM diabetes mellitus; COPD chronic obstructive pulmonary disease

## 2022-07-30 ENCOUNTER — Ambulatory Visit: Payer: 59 | Attending: Cardiovascular Disease

## 2022-07-30 ENCOUNTER — Ambulatory Visit (INDEPENDENT_AMBULATORY_CARE_PROVIDER_SITE_OTHER): Payer: 59 | Admitting: Ophthalmology

## 2022-07-30 ENCOUNTER — Encounter (INDEPENDENT_AMBULATORY_CARE_PROVIDER_SITE_OTHER): Payer: Self-pay | Admitting: Ophthalmology

## 2022-07-30 DIAGNOSIS — Z5181 Encounter for therapeutic drug level monitoring: Secondary | ICD-10-CM

## 2022-07-30 DIAGNOSIS — I480 Paroxysmal atrial fibrillation: Secondary | ICD-10-CM | POA: Diagnosis not present

## 2022-07-30 DIAGNOSIS — I10 Essential (primary) hypertension: Secondary | ICD-10-CM | POA: Diagnosis not present

## 2022-07-30 DIAGNOSIS — H35033 Hypertensive retinopathy, bilateral: Secondary | ICD-10-CM | POA: Diagnosis not present

## 2022-07-30 DIAGNOSIS — E113313 Type 2 diabetes mellitus with moderate nonproliferative diabetic retinopathy with macular edema, bilateral: Secondary | ICD-10-CM

## 2022-07-30 DIAGNOSIS — Z961 Presence of intraocular lens: Secondary | ICD-10-CM | POA: Diagnosis not present

## 2022-07-30 LAB — POCT INR: INR: 3.5 — AB (ref 2.0–3.0)

## 2022-07-30 MED ORDER — AFLIBERCEPT 2MG/0.05ML IZ SOLN FOR KALEIDOSCOPE
2.0000 mg | INTRAVITREAL | Status: AC | PRN
  Administered 2022-07-30: 2 mg via INTRAVITREAL

## 2022-07-30 NOTE — Patient Instructions (Signed)
Description   HOLD today's dose and then continue taking 1.5 tablets daily except for 1 tablet every Monday, Wednesday and Friday.  Recheck INR in 4 weeks.  Anticoagulation Clinic [phone]

## 2022-07-31 ENCOUNTER — Other Ambulatory Visit: Payer: Self-pay | Admitting: Internal Medicine

## 2022-07-31 DIAGNOSIS — Z794 Long term (current) use of insulin: Secondary | ICD-10-CM

## 2022-08-15 ENCOUNTER — Other Ambulatory Visit: Payer: Self-pay | Admitting: Internal Medicine

## 2022-08-15 DIAGNOSIS — G2581 Restless legs syndrome: Secondary | ICD-10-CM

## 2022-08-16 ENCOUNTER — Other Ambulatory Visit: Payer: Self-pay | Admitting: Internal Medicine

## 2022-08-16 DIAGNOSIS — I10 Essential (primary) hypertension: Secondary | ICD-10-CM

## 2022-08-16 DIAGNOSIS — I2510 Atherosclerotic heart disease of native coronary artery without angina pectoris: Secondary | ICD-10-CM

## 2022-08-16 DIAGNOSIS — I5022 Chronic systolic (congestive) heart failure: Secondary | ICD-10-CM

## 2022-08-18 ENCOUNTER — Other Ambulatory Visit: Payer: Self-pay | Admitting: Internal Medicine

## 2022-08-18 DIAGNOSIS — E1152 Type 2 diabetes mellitus with diabetic peripheral angiopathy with gangrene: Secondary | ICD-10-CM

## 2022-08-21 NOTE — Progress Notes (Addendum)
[facility] Note  08/27/2022    CHIEF COMPLAINT Patient presents for Retina Follow Up  HISTORY OF PRESENT ILLNESS: William [name] is a 72 y.o. male who presents to the clinic today for:   HPI     Retina Follow Up   Patient presents with  Diabetic Retinopathy.  In both eyes.  I, the attending physician,  performed the HPI with the patient and updated documentation appropriately.        Comments   Patient here for 5 weeks retina follow up for NPDR OU. Patient states vision doing ok. No eye pain.       Last edited by [name], MD on 08/27/2022  8:35 AM.    4 week (early) f/u due to MD schedule. Pt states VA is stable  Referring physician: [name], MD [place],  [place] [address]  HISTORICAL INFORMATION:  Selected notes from the MEDICAL RECORD NUMBER Referred by Dr. [name] for eval of DME OU LEE:  Ocular Hx- PMH-    CURRENT MEDICATIONS: No current outpatient medications on file. (Ophthalmic Drugs)   No current facility-administered medications for this visit. (Ophthalmic Drugs)   Current Outpatient Medications (Other)  Medication Sig   albuterol (PROVENTIL HFA;VENTOLIN HFA) 108 (90 Base) MCG/ACT inhaler Inhale 2 puffs into the lungs every 6 (six) hours as needed for wheezing or shortness of breath.   aspirin EC 81 MG tablet Take 1 tablet (81 mg total) by mouth daily.   carvedilol (COREG) 3.125 MG tablet TAKE 1 TABLET(3.125 MG) BY MOUTH TWICE DAILY WITH A MEAL   Continuous Blood Gluc Receiver (FREESTYLE LIBRE 2 READER) DEVI 1 Act by Does not apply route daily.   Continuous Blood Gluc Sensor (FREESTYLE LIBRE 2 SENSOR) MISC USE TO CHECK BLOOD SUGAR AND CHANGE EVERY 14 DAYS   dapagliflozin propanediol (FARXIGA) 10 MG TABS tablet Take 1 tablet (10 mg total) by mouth daily.   gabapentin (NEURONTIN) 300 MG capsule TAKE 1 CAPSULE(300 MG) BY MOUTH TWICE DAILY   Glucagon (GVOKE HYPOPEN 2-PACK) 1 MG/0.2ML SOAJ Inject 1 Act into  the skin daily as needed.   HUMALOG KWIKPEN 100 UNIT/ML KwikPen INJECT 10 UNITS UNDER THE SKIN THREE TIMES DAILY AS DIRECTED BY SLIDING SCALE (Patient taking differently: Inject 10-45 Units into the skin 3 (three) times daily. Pt takes with meals)   insulin glargine (LANTUS SOLOSTAR) 100 UNIT/ML Solostar Pen INJECT 55 UNITS UNDER THE SKIN DAILY (Patient taking differently: INJECT 55 UNITS UNDER THE SKIN DAILY PT TAKES IN THE PM)   Insulin Pen Needle 31G X 8 MM MISC 1 each by Does not apply route 4 (four) times daily.   pantoprazole (PROTONIX) 40 MG tablet TAKE 1 TABLET(40 MG) BY MOUTH TWICE DAILY BEFORE A MEAL   pramipexole (MIRAPEX) 0.125 MG tablet TAKE 1 TABLET(0.125 MG) BY MOUTH AT BEDTIME   rosuvastatin (CRESTOR) 40 MG tablet TAKE 1 TABLET BY MOUTH EVERY DAY   torsemide (DEMADEX) 20 MG tablet TAKE 1 TABLET(20 MG) BY MOUTH 3 TIMES A WEEK Monday, Wednesday and Friday   warfarin (COUMADIN) 5 MG tablet TAKE 1 TO 2 TABLETS BY MOUTH DAILY AS DIRECTED BY COUMADIN CLINIC   No current facility-administered medications for this visit. (Other)   REVIEW OF SYSTEMS: ROS   Positive for: Gastrointestinal, Genitourinary, Endocrine, Eyes Negative for: Constitutional, Neurological, Skin, Musculoskeletal, HENT, Cardiovascular, Respiratory, Psychiatric, Allergic/Imm, Heme/Lymph Last edited by [name], COA on 08/27/2022  7:58 AM.  ALLERGIES Allergies  Allergen Reactions   Morphine And Related Shortness Of Breath and Other (See Comments)    UNSPECIFIED REACTION "Pt said it was too much"    Latex Rash   PAST MEDICAL HISTORY Past Medical History:  Diagnosis Date   AKI (acute kidney injury) ([place])    With STEMI in 0000000   Chronic systolic CHF (congestive heart failure) ([place])    Depression    Diabetes mellitus without complication ([name])    Diabetic retinopathy ([name])    GERD (gastroesophageal reflux disease)    Hx of adenomatous colonic polyps 04/07/2018   Hyperlipidemia    Hypertension     Hypertensive retinopathy    Paroxysmal atrial fibrillation ([name])    Peripheral vascular disease ([name])    Pneumonia    Seizures ([name])    hx of as a child   STEMI (ST elevation myocardial infarction) ([place]) 2017   Past Surgical History:  Procedure Laterality Date   ABDOMINAL AORTOGRAM W/LOWER EXTREMITY N/A 09/19/2016   Procedure: Abdominal Aortogram w/Lower Extremity;  Surgeon: Wellington Hampshire, MD;  Location: [facility];  Service: Cardiovascular;  Laterality: N/A;   AMPUTATION Right 03/23/2016   Procedure: 1st and 2nd Ray Amputation Right Foot;  Surgeon: Newt Minion, MD;  Location: [facility];  Service: Orthopedics;  Laterality: Right;   AMPUTATION Right 06/21/2016   Procedure: RIGHT TRANSMETATARSAL AMPUTATION;  Surgeon: Newt Minion, MD;  Location: [facility];  Service: Orthopedics;  Laterality: Right;   CARDIAC CATHETERIZATION N/A 03/13/2016   Procedure: Right/Left Heart Cath and Coronary Angiography;  Surgeon: Sherren Mocha, MD;  Location: [facility];  Service: Cardiovascular;  Laterality: N/A;   CARDIAC CATHETERIZATION N/A 03/13/2016   Procedure: IABP Insertion;  Surgeon: Sherren Mocha, MD;  Location: [facility];  Service: Cardiovascular;  Laterality: N/A;   CATARACT EXTRACTION     CATARACT EXTRACTION, BILATERAL     CERVICAL FUSION  1982, 1992   has had 3 neck surgeries from breaking his neck   CIRCUMCISION N/A 11/07/2021   Procedure: CIRCUMCISION ADULT;  Surgeon: Vira Agar, MD;  Location: [facility];  Service: Urology;  Laterality: N/A;   CORONARY ARTERY BYPASS GRAFT N/A 03/13/2016   Procedure: CORONARY ARTERY BYPASS GRAFTING (CABG) x 1 (SVG to OM) with EVH from [place];  Surgeon: Ivin Poot, MD;  Location: [facility];  Service: Open Heart Surgery;  Laterality: N/A;   EYE SURGERY     LOWER EXTREMITY ANGIOGRAM  05/02/2016   Procedure: Lower Extremity Angiogram;  Surgeon: Wellington Hampshire, MD;  Location: [facility];  Service:  Cardiovascular;;  Limited left femoral runoff right femoral runoff   PERIPHERAL VASCULAR CATHETERIZATION N/A 05/02/2016   Procedure: Abdominal Aortogram;  Surgeon: Wellington Hampshire, MD;  Location: [facility];  Service: Cardiovascular;  Laterality: N/A;   PERIPHERAL VASCULAR CATHETERIZATION Right 05/02/2016   Procedure: Peripheral Vascular Balloon Angioplasty;  Surgeon: Wellington Hampshire, MD;  Location: [facility];  Service: Cardiovascular;  Laterality: Right;  SFA   TEE WITHOUT CARDIOVERSION N/A 03/13/2016   Procedure: TRANSESOPHAGEAL ECHOCARDIOGRAM (TEE);  Surgeon: Ivin Poot, MD;  Location: [facility];  Service: Open Heart Surgery;  Laterality: N/A;   VSD REPAIR N/A 03/13/2016   Procedure: VENTRICULAR SEPTAL DEFECT (VSD) REPAIR;  Surgeon: Ivin Poot, MD;  Location: [facility];  Service: Open Heart Surgery;  Laterality: N/A;   FAMILY HISTORY Family History  Problem Relation Age of Onset  Diabetes Maternal Grandmother    Diabetes Mother    Aneurysm Mother    Peripheral Artery Disease Mother    Coronary artery disease Mother    Peptic Ulcer Father    Retinoblastoma Daughter    Colon cancer Neg Hx    Rectal cancer Neg Hx    SOCIAL HISTORY Social History   Tobacco Use   Smoking status: Former    Packs/day: 0.50    Types: Cigarettes    Quit date: 11/20/2015    Years since quitting: 6.7   Smokeless tobacco: Never   Tobacco comments:    quit 2018  Vaping Use   Vaping Use: Never used  Substance Use Topics   Alcohol use: No   Drug use: No       OPHTHALMIC EXAM: Base Eye Exam     Visual Acuity (Snellen - Linear)       Right Left   Dist [place] 20/25 20/25   Dist ph [place] 20/20 -2 20/20 -2         Tonometry (Tonopen, 7:55 AM)       Right Left   Pressure 14 14         Pupils       Dark Light Shape React APD   Right 2 1 Round Brisk None   Left 2 1 Round Brisk None         Visual Fields (Counting fingers)       Left Right    Full Full          Extraocular Movement       Right Left    Full, Ortho Full, Ortho         Neuro/Psych     Oriented x3: Yes   Mood/Affect: Normal         Dilation     Both eyes: 1.0% Mydriacyl, 2.5% Phenylephrine @ 7:55 AM           Slit Lamp and Fundus Exam     Slit Lamp Exam       Right Left   Lids/Lashes Dermatochalasis - upper lid, mild MGD Dermatochalasis - upper lid, mild MGD   Conjunctiva/Sclera White and quiet White and quiet   Cornea 1-2+PEE 1-2+ fine PEE   Anterior Chamber Deep and quiet Deep and quiet   Iris Round and dilated, No NVI Round and dilated, No NVI   Lens PC IOL in good position PC IOL in good position   Anterior Vitreous Vitreous syneresis, vitreous condensations Vitreous syneresis, Posterior vitreous detachment, vitreous condensations         Fundus Exam       Right Left   Disc Pink and Sharp mild Pallor, Sharp rim, mild tilt   C/D Ratio 0.3 0.3   Macula Flat, good foveal reflex, scatted Microaneurysms/DBH - improved, scattered Cystic changes/edema greatest superior mac -- slightly improved good foveal reflex, central IRF/edema - slightly improved, +MA/DBH -- improved   Vessels attenuated, mild tortuosity attenuated, Tortuous   Periphery Attached, +MA greatest posteriorly Attached, scattered MA greatest posteriorly           IMAGING AND PROCEDURES  Imaging and Procedures for 08/27/2022  OCT, Retina - OU - Both Eyes       Right Eye Quality was good. Central Foveal Thickness: 265. Progression has improved. Findings include no SRF, abnormal foveal contour, intraretinal hyper-reflective material, intraretinal fluid, vitreomacular adhesion (interval improvement in IRF/cystic changes central and superior mac).   Left Eye Quality was good. Central  Foveal Thickness: 252. Progression has improved. Findings include no SRF, abnormal foveal contour, intraretinal hyper-reflective material, intraretinal fluid (Persistent cystic changes ST fovea and macula --  slightly improved).   Notes *Images captured and stored on drive  Diagnosis / Impression:  +DME OU OD: interval improvement in IRF/cystic changes central and superior mac OS: Persistent cystic changes ST fovea and macula -- slightly improved  Clinical management:  See below  Abbreviations: NFP - Normal foveal profile. CME - cystoid macular edema. PED - pigment epithelial detachment. IRF - intraretinal fluid. SRF - subretinal fluid. EZ - ellipsoid zone. ERM - epiretinal membrane. ORA - outer retinal atrophy. ORT - outer retinal tubulation. SRHM - subretinal hyper-reflective material. IRHM - intraretinal hyper-reflective material      Intravitreal Injection, Pharmacologic Agent - OD - Right Eye       Time Out 08/27/2022. 7:54 AM. Confirmed correct patient, procedure, site, and patient consented.   Anesthesia Topical anesthesia was used. Anesthetic medications included Lidocaine 2%, Proparacaine 0.5%.   Procedure Preparation included 5% betadine to ocular surface, eyelid speculum. A (32g) needle was used.   Injection: 2 mg aflibercept 2 MG/0.05ML   Route: Intravitreal, Site: Right Eye   NDC: O5083423, Lot: [id number], Expiration date: 08/30/2023, Waste: 0 mL   Post-op Post injection exam found visual acuity of at least counting fingers. The patient tolerated the procedure well. There were no complications. The patient received written and verbal post procedure care education. Post injection medications were not given.      Intravitreal Injection, Pharmacologic Agent - OS - Left Eye       Time Out 08/27/2022. 7:54 AM. Confirmed correct patient, procedure, site, and patient consented.   Anesthesia Topical anesthesia was used. Anesthetic medications included Lidocaine 2%, Proparacaine 0.5%.   Procedure Preparation included 5% betadine to ocular surface, eyelid speculum. A (32g) needle was used.   Injection: 2 mg aflibercept 2 MG/0.05ML   Route: Intravitreal, Site:  Left Eye   NDC: O5083423, Lot: [id number], Expiration date: 08/30/2023, Waste: 0 mL   Post-op Post injection exam found visual acuity of at least counting fingers. The patient tolerated the procedure well. There were no complications. The patient received written and verbal post procedure care education. Post injection medications were not given.            ASSESSMENT/PLAN:    ICD-10-CM   1. Moderate nonproliferative diabetic retinopathy of both eyes with macular edema associated with type 2 diabetes mellitus ([name])  E11.3313 OCT, Retina - OU - Both Eyes    Intravitreal Injection, Pharmacologic Agent - OD - Right Eye    Intravitreal Injection, Pharmacologic Agent - OS - Left Eye    aflibercept (EYLEA) SOLN 2 mg    aflibercept (EYLEA) SOLN 2 mg    2. Essential hypertension  I10     3. Hypertensive retinopathy of both eyes  H35.033     4. Pseudophakia, both eyes  Z96.1       1. Moderate non-proliferative diabetic retinopathy, both eyes  - 4 wk f/u instead of 5 due to MD schedule  - s/p IVA OD #1 (11.14.22), #2 (12.12.22), #3 (01.09.23) -- IVA resistance - s/p IVA OS #1 (10.17.22), #2 (11.14.22), #3 (12.12.22), #4 (01.09.23), #5 (03.15.23), #6 (04.12.23) -- IVA resistance - s/p IVE OD #1 (03.15.23), #2 (04.12.23), #3 (05.10.23), #4 (06.07.23), #5 (07.05.23), #6 (08.02.23), #7 (08.31.23), #8 (09.28.23), #9 (10.30.23), #10 (11.28.23), #11 (12.28.23), #12 (01.29.24) - s/p IVE OS #1 (05.10.23), #2 (06.07.23), #3 (07.05.23), #4 (  08.02.23), #5 (08.31.23), #6 (09.28.23), #7 (10.30.23), #8 (11.28.23), #9 (12.28.23), #10 (01.29.24) - exam shows scattered MA, DBH OU - FA (10.17.22) shows late leaking MA OU, no NV OU - OCT shows OD: interval improvement in IRF/cystic changes central and superior mac; OS: Persistent cystic changes ST fovea and macula -- slightly improved at 4 wks - discussed possibility of elevated BP blunting efficacy of IVE - BCVA 20/20 OU - recommend IVE OD #13 and IVE  OS #11 today, 02.20.24 for DME with follow up in 4-5 weeks - pt wishes to proceed - RBA of procedure discussed, questions answered - IVE informed consent obtained and signed, 03.15.23 (OD) - IVE informed consent obtained and signed, 05.10.23 (OS) - see procedure note - f/u in 4-5 wks -- DFE/OCT, possible injection(s)  2,3. Hypertensive retinopathy OU - discussed importance of tight BP control and possibility of elevated BP blunting efficacy of IVE - monitor  4. Pseudophakia OU  - s/p CE/IOL OU  - IOL in good position, doing well  - monitor  Ophthalmic Meds Ordered this visit:  Meds ordered this encounter  Medications   aflibercept (EYLEA) SOLN 2 mg   aflibercept (EYLEA) SOLN 2 mg     Return in 4 weeks (on 09/24/2022) for NPDR OU, DFE, OCT, Possible Injxn.  There are no Patient Instructions on file for this visit.  Explained the diagnoses, plan, and follow up with the patient and they expressed understanding.  Patient expressed understanding of the importance of proper follow up care.   This document serves as a record of services personally performed by Gardiner Sleeper, MD, PhD. It was created on their behalf by Roselee Nova, COMT. The creation of this record is the provider's dictation and/or activities during the visit.  Electronically signed by: Roselee Nova, COMT 08/27/22 9:02 AM  This document serves as a record of services personally performed by Gardiner Sleeper, MD, PhD. It was created on their behalf by San Jetty. Owens Shark, OA an ophthalmic technician. The creation of this record is the provider's dictation and/or activities during the visit.    Electronically signed by: San Jetty. Owens Shark, [place] 02.26.2024 9:02 AM  Gardiner Sleeper, M.D., Ph.D. Diseases & Surgery of the Retina and Vitreous [facility]  I have reviewed the above documentation for accuracy and completeness, and I agree with the above. Gardiner Sleeper, M.D., Ph.D. 08/27/22 9:02  AM  Abbreviations: M myopia (nearsighted); A astigmatism; H hyperopia (farsighted); P presbyopia; Mrx spectacle prescription;  CTL contact lenses; OD right eye; OS left eye; OU both eyes  XT exotropia; ET esotropia; PEK punctate epithelial keratitis; PEE punctate epithelial erosions; DES dry eye syndrome; MGD meibomian gland dysfunction; ATs artificial tears; PFAT's preservative free artificial tears; [place] nuclear sclerotic cataract; PSC posterior subcapsular cataract; ERM epi-retinal membrane; PVD posterior vitreous detachment; RD retinal detachment; DM diabetes mellitus; DR diabetic retinopathy; NPDR non-proliferative diabetic retinopathy; PDR proliferative diabetic retinopathy; CSME clinically significant macular edema; DME diabetic macular edema; dbh dot blot hemorrhages; CWS cotton wool spot; POAG primary open angle glaucoma; C/D cup-to-disc ratio; HVF humphrey visual field; GVF goldmann visual field; OCT optical coherence tomography; IOP intraocular pressure; BRVO Branch retinal vein occlusion; CRVO central retinal vein occlusion; CRAO central retinal artery occlusion; BRAO branch retinal artery occlusion; RT retinal tear; SB scleral buckle; PPV pars plana vitrectomy; VH Vitreous hemorrhage; PRP panretinal laser photocoagulation; IVK intravitreal kenalog; VMT vitreomacular traction; MH Macular hole;  NVD neovascularization of the disc; NVE neovascularization elsewhere;  AREDS age related eye disease study; ARMD age related macular degeneration; POAG primary open angle glaucoma; EBMD epithelial/anterior basement membrane dystrophy; ACIOL anterior chamber intraocular lens; IOL intraocular lens; PCIOL posterior chamber intraocular lens; Phaco/IOL phacoemulsification with intraocular lens placement; [place] photorefractive keratectomy; LASIK laser assisted in situ keratomileusis; HTN hypertension; DM diabetes mellitus; COPD chronic obstructive pulmonary disease

## 2022-08-27 ENCOUNTER — Encounter (INDEPENDENT_AMBULATORY_CARE_PROVIDER_SITE_OTHER): Payer: Self-pay | Admitting: Ophthalmology

## 2022-08-27 ENCOUNTER — Ambulatory Visit: Payer: 59 | Attending: Cardiology | Admitting: *Deleted

## 2022-08-27 ENCOUNTER — Ambulatory Visit (INDEPENDENT_AMBULATORY_CARE_PROVIDER_SITE_OTHER): Payer: 59 | Admitting: Ophthalmology

## 2022-08-27 DIAGNOSIS — Z0001 Encounter for general adult medical examination with abnormal findings: Secondary | ICD-10-CM

## 2022-08-27 DIAGNOSIS — I480 Paroxysmal atrial fibrillation: Secondary | ICD-10-CM

## 2022-08-27 DIAGNOSIS — E113313 Type 2 diabetes mellitus with moderate nonproliferative diabetic retinopathy with macular edema, bilateral: Secondary | ICD-10-CM

## 2022-08-27 DIAGNOSIS — Z961 Presence of intraocular lens: Secondary | ICD-10-CM

## 2022-08-27 DIAGNOSIS — I10 Essential (primary) hypertension: Secondary | ICD-10-CM | POA: Diagnosis not present

## 2022-08-27 DIAGNOSIS — H35033 Hypertensive retinopathy, bilateral: Secondary | ICD-10-CM | POA: Diagnosis not present

## 2022-08-27 LAB — POCT INR: POC INR: 2.5

## 2022-08-27 MED ORDER — AFLIBERCEPT 2MG/0.05ML IZ SOLN FOR KALEIDOSCOPE
2.0000 mg | INTRAVITREAL | Status: AC | PRN
  Administered 2022-08-27: 2 mg via INTRAVITREAL

## 2022-08-27 NOTE — Patient Instructions (Signed)
Description   Continue taking 1.5 tablets daily except for 1 tablet every Monday, Wednesday and Friday.  Recheck INR in 4 weeks.  Anticoagulation Clinic [phone]

## 2022-09-02 ENCOUNTER — Other Ambulatory Visit: Payer: Self-pay | Admitting: Internal Medicine

## 2022-09-02 DIAGNOSIS — K219 Gastro-esophageal reflux disease without esophagitis: Secondary | ICD-10-CM

## 2022-09-07 ENCOUNTER — Other Ambulatory Visit: Payer: Self-pay | Admitting: Internal Medicine

## 2022-09-07 DIAGNOSIS — N1832 Chronic kidney disease, stage 3b: Secondary | ICD-10-CM

## 2022-09-07 DIAGNOSIS — E1122 Type 2 diabetes mellitus with diabetic chronic kidney disease: Secondary | ICD-10-CM

## 2022-09-07 DIAGNOSIS — Z794 Long term (current) use of insulin: Secondary | ICD-10-CM

## 2022-09-07 DIAGNOSIS — I5022 Chronic systolic (congestive) heart failure: Secondary | ICD-10-CM

## 2022-09-10 ENCOUNTER — Other Ambulatory Visit: Payer: Self-pay | Admitting: *Deleted

## 2022-09-10 MED ORDER — WARFARIN SODIUM 5 MG PO TABS: ORAL_TABLET | ORAL | 0 refills | Status: DC

## 2022-09-10 NOTE — Telephone Encounter (Signed)
Prescription refill request received for warfarin Lov: Dick, 10/31/2021 Next INR check: 3/25 Warfarin tablet strength: '5mg'$ 

## 2022-09-20 NOTE — Progress Notes (Signed)
[facility] Note  09/24/2022    CHIEF COMPLAINT Patient presents for Retina Follow Up  HISTORY OF PRESENT ILLNESS: William [name] is a 72 y.o. male who presents to the clinic today for:   HPI     Retina Follow Up   Patient presents with  Diabetic Retinopathy.  In both eyes.  This started years ago.  Duration of 4 weeks.  I, the attending physician,  performed the HPI with the patient and updated documentation appropriately.        Comments   Patient feels that the vision is stable at this time. He is using Refresh OU PRN. His blood sugar 173.      Last edited by [name], MD on 09/24/2022  8:44 AM.      Referring physician: [name], MD [place],  [name] [address]  HISTORICAL INFORMATION:  Selected notes from the MEDICAL RECORD NUMBER Referred by Dr. [name] for eval of DME OU LEE:  Ocular Hx- PMH-    CURRENT MEDICATIONS: No current outpatient medications on file. (Ophthalmic Drugs)   No current facility-administered medications for this visit. (Ophthalmic Drugs)   Current Outpatient Medications (Other)  Medication Sig   albuterol (PROVENTIL HFA;VENTOLIN HFA) 108 (90 Base) MCG/ACT inhaler Inhale 2 puffs into the lungs every 6 (six) hours as needed for wheezing or shortness of breath.   aspirin EC 81 MG tablet Take 1 tablet (81 mg total) by mouth daily.   carvedilol (COREG) 3.125 MG tablet TAKE 1 TABLET(3.125 MG) BY MOUTH TWICE DAILY WITH A MEAL   Continuous Blood Gluc Receiver (FREESTYLE LIBRE 2 READER) DEVI 1 Act by Does not apply route daily.   Continuous Blood Gluc Sensor (FREESTYLE LIBRE 2 SENSOR) MISC USE TO CHECK BLOOD SUGAR AND CHANGE EVERY 14 DAYS   dapagliflozin propanediol (FARXIGA) 10 MG TABS tablet TAKE 1 TABLET(10 MG) BY MOUTH DAILY   gabapentin (NEURONTIN) 300 MG capsule TAKE 1 CAPSULE(300 MG) BY MOUTH TWICE DAILY   Glucagon (GVOKE HYPOPEN 2-PACK) 1 MG/0.2ML SOAJ Inject 1 Act into the skin daily as  needed.   HUMALOG KWIKPEN 100 UNIT/ML KwikPen INJECT 10 UNITS UNDER THE SKIN THREE TIMES DAILY AS DIRECTED BY SLIDING SCALE (Patient taking differently: Inject 10-45 Units into the skin 3 (three) times daily. Pt takes with meals)   insulin glargine (LANTUS SOLOSTAR) 100 UNIT/ML Solostar Pen INJECT 55 UNITS UNDER THE SKIN DAILY (Patient taking differently: INJECT 55 UNITS UNDER THE SKIN DAILY PT TAKES IN THE PM)   Insulin Pen Needle 31G X 8 MM MISC 1 each by Does not apply route 4 (four) times daily.   pantoprazole (PROTONIX) 40 MG tablet TAKE 1 TABLET(40 MG) BY MOUTH TWICE DAILY BEFORE A MEAL   pramipexole (MIRAPEX) 0.125 MG tablet TAKE 1 TABLET(0.125 MG) BY MOUTH AT BEDTIME   rosuvastatin (CRESTOR) 40 MG tablet TAKE 1 TABLET BY MOUTH EVERY DAY   torsemide (DEMADEX) 20 MG tablet TAKE 1 TABLET(20 MG) BY MOUTH 3 TIMES A WEEK Monday, Wednesday and Friday   warfarin (COUMADIN) 5 MG tablet Take 1 to 1 &1/2  by mouth daily as directed by the coumadin clinic   No current facility-administered medications for this visit. (Other)   REVIEW OF SYSTEMS: ROS   Positive for: Gastrointestinal, Genitourinary, Endocrine, Eyes Negative for: Constitutional, Neurological, Skin, Musculoskeletal, HENT, Cardiovascular, Respiratory, Psychiatric, Allergic/Imm, Heme/Lymph Last edited by Annie Paras, COT on 09/24/2022  7:48 AM.      ALLERGIES  Allergies  Allergen Reactions   Morphine And Related Shortness Of Breath and Other (See Comments)    UNSPECIFIED REACTION "Pt said it was too much"    Latex Rash   PAST MEDICAL HISTORY Past Medical History:  Diagnosis Date   AKI (acute kidney injury) ([facility])    With STEMI in 0000000   Chronic systolic CHF (congestive heart failure) ([facility])    Depression    Diabetes mellitus without complication ([name])    Diabetic retinopathy ([name])    GERD (gastroesophageal reflux disease)    Hx of adenomatous colonic polyps 04/07/2018   Hyperlipidemia    Hypertension     Hypertensive retinopathy    Paroxysmal atrial fibrillation ([name])    Peripheral vascular disease ([name])    Pneumonia    Seizures ([name])    hx of as a child   STEMI (ST elevation myocardial infarction) ([facility]) 2017   Past Surgical History:  Procedure Laterality Date   ABDOMINAL AORTOGRAM W/LOWER EXTREMITY N/A 09/19/2016   Procedure: Abdominal Aortogram w/Lower Extremity;  Surgeon: Wellington Hampshire, MD;  Location: [facility];  Service: Cardiovascular;  Laterality: N/A;   AMPUTATION Right 03/23/2016   Procedure: 1st and 2nd Ray Amputation Right Foot;  Surgeon: Newt Minion, MD;  Location: [facility];  Service: Orthopedics;  Laterality: Right;   AMPUTATION Right 06/21/2016   Procedure: RIGHT TRANSMETATARSAL AMPUTATION;  Surgeon: Newt Minion, MD;  Location: [facility];  Service: Orthopedics;  Laterality: Right;   CARDIAC CATHETERIZATION N/A 03/13/2016   Procedure: Right/Left Heart Cath and Coronary Angiography;  Surgeon: Sherren Mocha, MD;  Location: [facility];  Service: Cardiovascular;  Laterality: N/A;   CARDIAC CATHETERIZATION N/A 03/13/2016   Procedure: IABP Insertion;  Surgeon: Sherren Mocha, MD;  Location: [facility];  Service: Cardiovascular;  Laterality: N/A;   CATARACT EXTRACTION     CATARACT EXTRACTION, BILATERAL     CERVICAL FUSION  1982, 1992   has had 3 neck surgeries from breaking his neck   CIRCUMCISION N/A 11/07/2021   Procedure: CIRCUMCISION ADULT;  Surgeon: Vira Agar, MD;  Location: [facility];  Service: Urology;  Laterality: N/A;   CORONARY ARTERY BYPASS GRAFT N/A 03/13/2016   Procedure: CORONARY ARTERY BYPASS GRAFTING (CABG) x 1 (SVG to OM) with EVH from [place];  Surgeon: Ivin Poot, MD;  Location: [facility];  Service: Open Heart Surgery;  Laterality: N/A;   EYE SURGERY     LOWER EXTREMITY ANGIOGRAM  05/02/2016   Procedure: Lower Extremity Angiogram;  Surgeon: Wellington Hampshire, MD;  Location: [facility];  Service:  Cardiovascular;;  Limited left femoral runoff right femoral runoff   PERIPHERAL VASCULAR CATHETERIZATION N/A 05/02/2016   Procedure: Abdominal Aortogram;  Surgeon: Wellington Hampshire, MD;  Location: [facility];  Service: Cardiovascular;  Laterality: N/A;   PERIPHERAL VASCULAR CATHETERIZATION Right 05/02/2016   Procedure: Peripheral Vascular Balloon Angioplasty;  Surgeon: Wellington Hampshire, MD;  Location: [facility];  Service: Cardiovascular;  Laterality: Right;  SFA   TEE WITHOUT CARDIOVERSION N/A 03/13/2016   Procedure: TRANSESOPHAGEAL ECHOCARDIOGRAM (TEE);  Surgeon: Ivin Poot, MD;  Location: [facility];  Service: Open Heart Surgery;  Laterality: N/A;   VSD REPAIR N/A 03/13/2016   Procedure: VENTRICULAR SEPTAL DEFECT (VSD) REPAIR;  Surgeon: Ivin Poot, MD;  Location: [facility];  Service: Open Heart Surgery;  Laterality: N/A;   FAMILY HISTORY Family History  Problem Relation Age of Onset  Diabetes Maternal Grandmother    Diabetes Mother    Aneurysm Mother    Peripheral Artery Disease Mother    Coronary artery disease Mother    Peptic Ulcer Father    Retinoblastoma Daughter    Colon cancer Neg Hx    Rectal cancer Neg Hx    SOCIAL HISTORY Social History   Tobacco Use   Smoking status: Former    Packs/day: .5    Types: Cigarettes    Quit date: 11/20/2015    Years since quitting: 6.8   Smokeless tobacco: Never   Tobacco comments:    quit 2018  Vaping Use   Vaping Use: Never used  Substance Use Topics   Alcohol use: No   Drug use: No       OPHTHALMIC EXAM: Base Eye Exam     Visual Acuity (Snellen - Linear)       Right Left   Dist [place] 20/25 +2 20/20 +1         Tonometry (Tonopen, 7:51 AM)       Right Left   Pressure 14 15         Pupils       Dark Light Shape React APD   Right 2 1 Round Brisk None   Left 2 1 Round Brisk None         Visual Fields       Left Right    Full Full         Extraocular Movement       Right Left     Full, Ortho Full, Ortho         Neuro/Psych     Oriented x3: Yes   Mood/Affect: Normal         Dilation     Both eyes: 1.0% Mydriacyl, 2.5% Phenylephrine @ 7:49 AM           Slit Lamp and Fundus Exam     Slit Lamp Exam       Right Left   Lids/Lashes Dermatochalasis - upper lid, mild MGD Dermatochalasis - upper lid, mild MGD   Conjunctiva/Sclera White and quiet White and quiet   Cornea 1-2+PEE 1-2+ fine PEE   Anterior Chamber Deep and quiet Deep and quiet   Iris Round and dilated, No NVI Round and dilated, No NVI   Lens PC IOL in good position PC IOL in good position   Anterior Vitreous Vitreous syneresis, vitreous condensations Vitreous syneresis, Posterior vitreous detachment, vitreous condensations         Fundus Exam       Right Left   Disc Pink and Sharp mild Pallor, Sharp rim, mild tilt   C/D Ratio 0.3 0.3   Macula Flat, good foveal reflex, scatted Microaneurysms/DBH - improved, scattered cystic changes/edema greatest superior mac -- slightly improved good foveal reflex, central IRF/edema - slightly improved, +MA/DBH -- improved   Vessels attenuated, mild tortuosity attenuated, Tortuous   Periphery Attached, +MA greatest posteriorly Attached, scattered MA greatest posteriorly           IMAGING AND PROCEDURES  Imaging and Procedures for 09/24/2022  OCT, Retina - OU - Both Eyes       Right Eye Quality was good. Central Foveal Thickness: 255. Progression has improved. Findings include no SRF, abnormal foveal contour, intraretinal hyper-reflective material, intraretinal fluid, vitreomacular adhesion (Mild interval improvement in IRF/cystic changes central and superior mac).   Left Eye Quality was good. Central Foveal Thickness: 251. Progression has improved. Findings include  no SRF, abnormal foveal contour, intraretinal hyper-reflective material, intraretinal fluid (Mild interval improvement in IRF/ cystic changes temporal fovea and macula ).    Notes *Images captured and stored on drive  Diagnosis / Impression:  +DME OU OD: mild interval improvement in IRF/cystic changes central and superior mac OS: Mild interval improvement in IRF/ cystic changes temporal fovea and macula   Clinical management:  See below  Abbreviations: NFP - Normal foveal profile. CME - cystoid macular edema. PED - pigment epithelial detachment. IRF - intraretinal fluid. SRF - subretinal fluid. EZ - ellipsoid zone. ERM - epiretinal membrane. ORA - outer retinal atrophy. ORT - outer retinal tubulation. SRHM - subretinal hyper-reflective material. IRHM - intraretinal hyper-reflective material      Intravitreal Injection, Pharmacologic Agent - OD - Right Eye       Time Out 09/24/2022. 8:39 AM. Confirmed correct patient, procedure, site, and patient consented.   Anesthesia Topical anesthesia was used. Anesthetic medications included Lidocaine 2%, Proparacaine 0.5%.   Procedure Preparation included 5% betadine to ocular surface, eyelid speculum. A (32g) needle was used.   Injection: 2 mg aflibercept 2 MG/0.05ML   Route: Intravitreal, Site: Right Eye   NDC: O5083423, Lot: [id number], Expiration date: 10/30/2023, Waste: 0 mL   Post-op Post injection exam found visual acuity of at least counting fingers. The patient tolerated the procedure well. There were no complications. The patient received written and verbal post procedure care education. Post injection medications were not given.      Intravitreal Injection, Pharmacologic Agent - OS - Left Eye       Time Out 09/24/2022. 8:39 AM. Confirmed correct patient, procedure, site, and patient consented.   Anesthesia Topical anesthesia was used. Anesthetic medications included Lidocaine 2%, Proparacaine 0.5%.   Procedure Preparation included 5% betadine to ocular surface, eyelid speculum. A (32g) needle was used.   Injection: 2 mg aflibercept 2 MG/0.05ML   Route: Intravitreal, Site: Left  Eye   NDC: O5083423, Lot: [id number], Expiration date: 05/02/2023, Waste: 0 mL   Post-op Post injection exam found visual acuity of at least counting fingers. The patient tolerated the procedure well. There were no complications. The patient received written and verbal post procedure care education. Post injection medications were not given.             ASSESSMENT/PLAN:    ICD-10-CM   1. Moderate nonproliferative diabetic retinopathy of both eyes with macular edema associated with type 2 diabetes mellitus ([name])  E11.3313 OCT, Retina - OU - Both Eyes    Intravitreal Injection, Pharmacologic Agent - OD - Right Eye    Intravitreal Injection, Pharmacologic Agent - OS - Left Eye    aflibercept (EYLEA) SOLN 2 mg    aflibercept (EYLEA) SOLN 2 mg    2. Essential hypertension  I10     3. Hypertensive retinopathy of both eyes  H35.033     4. Pseudophakia, both eyes  Z96.1        1. Moderate non-proliferative diabetic retinopathy, both eyes  - s/p IVA OD #1 (11.14.22), #2 (12.12.22), #3 (01.09.23) -- IVA resistance - s/p IVA OS #1 (10.17.22), #2 (11.14.22), #3 (12.12.22), #4 (01.09.23), #5 (03.15.23), #6 (04.12.23) -- IVA resistance - s/p IVE OD #1 (03.15.23), #2 (04.12.23), #3 (05.10.23), #4 (06.07.23), #5 (07.05.23), #6 (08.02.23), #7 (08.31.23), #8 (09.28.23), #9 (10.30.23), #10 (11.28.23), #11 (12.28.23), #12 (01.29.24), #13 (02.20.24) - s/p IVE OS #1 (05.10.23), #2 (06.07.23), #3 (07.05.23), #4 (08.02.23), #5 (08.31.23), #6 (09.28.23), #7 (10.30.23), #8 (11.28.23), #9 (12.28.23), #  10 (01.29.24), #11 (02.20.[name]24) - exam shows scattered MA, DBH OU - FA (10.17.22) shows late leaking MA OU, no NV OU - OCT shows OD: mild interval improvement in IRF/cystic changes central and superior mac; OS: Mild interval improvement in IRF/ cystic changes temporal fovea and macula at 4 weeks - discussed possibility of elevated BP blunting efficacy of IVE - BCVA OD: 20/25, OS: 20/20  - recommend IVE  OD #14 and IVE OS #12 today, 03.25.24 for DME with follow up in 4 weeks - pt wishes to proceed - RBA of procedure discussed, questions answered - IVE informed consent obtained and signed, 03.15.23 (OD) - IVE informed consent obtained and signed, 05.10.23 (OS) - see procedure note - f/u in 4 wks -- DFE/OCT, possible injection(s)  2,3. Hypertensive retinopathy OU - discussed importance of tight BP control and possibility of elevated BP blunting efficacy of IVE - monitor  4. Pseudophakia OU  - s/p CE/IOL OU  - IOL in good position, doing well  - monitor  Ophthalmic Meds Ordered this visit:  Meds ordered this encounter  Medications   aflibercept (EYLEA) SOLN 2 mg   aflibercept (EYLEA) SOLN 2 mg     Return in about 4 weeks (around 10/22/2022) for f/u NPDR OU, DFE, OCT.  There are no Patient Instructions on file for this visit.  Explained the diagnoses, plan, and follow up with the patient and they expressed understanding.  Patient expressed understanding of the importance of proper follow up care.   This document serves as a record of services personally performed by Gardiner Sleeper, MD, PhD. It was created on their behalf by Roselee Nova, COMT. The creation of this record is the provider's dictation and/or activities during the visit.  Electronically signed by: Roselee Nova, COMT 09/24/22 8:58 AM  This document serves as a record of services personally performed by Gardiner Sleeper, MD, PhD. It was created on their behalf by San Jetty. Owens Shark, OA an ophthalmic technician. The creation of this record is the provider's dictation and/or activities during the visit.    Electronically signed by: San Jetty. Owens Shark, [place] 03.25.2024 8:58 AM   Gardiner Sleeper, M.D., Ph.D. Diseases & Surgery of the Retina and Vitreous [facility]  I have reviewed the above documentation for accuracy and completeness, and I agree with the above. Gardiner Sleeper, M.D., Ph.D. 09/24/22 8:59  AM   Abbreviations: M myopia (nearsighted); A astigmatism; H hyperopia (farsighted); P presbyopia; Mrx spectacle prescription;  CTL contact lenses; OD right eye; OS left eye; OU both eyes  XT exotropia; ET esotropia; PEK punctate epithelial keratitis; PEE punctate epithelial erosions; DES dry eye syndrome; MGD meibomian gland dysfunction; ATs artificial tears; PFAT's preservative free artificial tears; [place] nuclear sclerotic cataract; PSC posterior subcapsular cataract; ERM epi-retinal membrane; PVD posterior vitreous detachment; RD retinal detachment; DM diabetes mellitus; DR diabetic retinopathy; NPDR non-proliferative diabetic retinopathy; PDR proliferative diabetic retinopathy; CSME clinically significant macular edema; DME diabetic macular edema; dbh dot blot hemorrhages; CWS cotton wool spot; POAG primary open angle glaucoma; C/D cup-to-disc ratio; HVF humphrey visual field; GVF goldmann visual field; OCT optical coherence tomography; IOP intraocular pressure; BRVO Branch retinal vein occlusion; CRVO central retinal vein occlusion; CRAO central retinal artery occlusion; BRAO branch retinal artery occlusion; RT retinal tear; SB scleral buckle; PPV pars plana vitrectomy; VH Vitreous hemorrhage; PRP panretinal laser photocoagulation; IVK intravitreal kenalog; VMT vitreomacular traction; MH Macular hole;  NVD neovascularization of the disc; NVE neovascularization elsewhere; AREDS age  related eye disease study; ARMD age related macular degeneration; POAG primary open angle glaucoma; EBMD epithelial/anterior basement membrane dystrophy; ACIOL anterior chamber intraocular lens; IOL intraocular lens; PCIOL posterior chamber intraocular lens; Phaco/IOL phacoemulsification with intraocular lens placement; [place] photorefractive keratectomy; LASIK laser assisted in situ keratomileusis; HTN hypertension; DM diabetes mellitus; COPD chronic obstructive pulmonary disease

## 2022-09-24 ENCOUNTER — Ambulatory Visit: Payer: 59 | Attending: Cardiovascular Disease

## 2022-09-24 ENCOUNTER — Encounter (INDEPENDENT_AMBULATORY_CARE_PROVIDER_SITE_OTHER): Payer: Self-pay | Admitting: Ophthalmology

## 2022-09-24 ENCOUNTER — Ambulatory Visit (INDEPENDENT_AMBULATORY_CARE_PROVIDER_SITE_OTHER): Payer: 59 | Admitting: Ophthalmology

## 2022-09-24 DIAGNOSIS — E113313 Type 2 diabetes mellitus with moderate nonproliferative diabetic retinopathy with macular edema, bilateral: Secondary | ICD-10-CM | POA: Diagnosis not present

## 2022-09-24 DIAGNOSIS — Z961 Presence of intraocular lens: Secondary | ICD-10-CM | POA: Diagnosis not present

## 2022-09-24 DIAGNOSIS — H35033 Hypertensive retinopathy, bilateral: Secondary | ICD-10-CM | POA: Diagnosis not present

## 2022-09-24 DIAGNOSIS — I480 Paroxysmal atrial fibrillation: Secondary | ICD-10-CM | POA: Diagnosis not present

## 2022-09-24 DIAGNOSIS — Z5181 Encounter for therapeutic drug level monitoring: Secondary | ICD-10-CM

## 2022-09-24 DIAGNOSIS — I10 Essential (primary) hypertension: Secondary | ICD-10-CM

## 2022-09-24 LAB — POCT INR: INR: 2 (ref 2.0–3.0)

## 2022-09-24 MED ORDER — AFLIBERCEPT 2MG/0.05ML IZ SOLN FOR KALEIDOSCOPE
2.0000 mg | INTRAVITREAL | Status: AC | PRN
  Administered 2022-09-24: 2 mg via INTRAVITREAL

## 2022-09-24 NOTE — Patient Instructions (Signed)
Description   Continue taking 1.5 tablets daily except for 1 tablet every Monday, Wednesday and Friday. Recheck INR in 4 weeks. Anticoagulation Clinic [phone]     

## 2022-10-03 ENCOUNTER — Other Ambulatory Visit: Payer: Self-pay | Admitting: Internal Medicine

## 2022-10-03 DIAGNOSIS — Z794 Long term (current) use of insulin: Secondary | ICD-10-CM

## 2022-10-03 MED ORDER — LANTUS SOLOSTAR 100 UNIT/ML ~~LOC~~ SOPN: PEN_INJECTOR | SUBCUTANEOUS | 0 refills | Status: DC

## 2022-10-07 ENCOUNTER — Other Ambulatory Visit: Payer: Self-pay | Admitting: Internal Medicine

## 2022-10-07 DIAGNOSIS — Z794 Long term (current) use of insulin: Secondary | ICD-10-CM

## 2022-10-07 DIAGNOSIS — E119 Type 2 diabetes mellitus without complications: Secondary | ICD-10-CM

## 2022-10-17 NOTE — Progress Notes (Signed)
[facility] - Clinic Note  10/22/2022    CHIEF COMPLAINT Patient presents for Retina Follow Up  HISTORY OF PRESENT ILLNESS: William [name] is a 72 y.o. male who presents to the clinic today for:   HPI     Retina Follow Up   Patient presents with  Diabetic Retinopathy.  In both eyes.  This started years ago.  Duration of 4 weeks.  I, the attending physician,  performed the HPI with the patient and updated documentation appropriately.        Comments   Patient feels that the vision is the same. He is using Systane OU PRN. His blood sugar was 188.       Last edited by Rennis Chris, MD on 10/22/2022  1:50 PM.     Pt states vision is doing well  Referring physician: [name], MD [address] [place],  [place] [address]  HISTORICAL INFORMATION:  Selected notes from the MEDICAL RECORD NUMBER Referred by Dr. Alben Spittle for eval of DME OU LEE:  Ocular Hx- PMH-    CURRENT MEDICATIONS: No current outpatient medications on file. (Ophthalmic Drugs)   No current facility-administered medications for this visit. (Ophthalmic Drugs)   Current Outpatient Medications (Other)  Medication Sig   albuterol (PROVENTIL HFA;VENTOLIN HFA) 108 (90 Base) MCG/ACT inhaler Inhale 2 puffs into the lungs every 6 (six) hours as needed for wheezing or shortness of breath.   aspirin EC 81 MG tablet Take 1 tablet (81 mg total) by mouth daily.   carvedilol (COREG) 3.125 MG tablet TAKE 1 TABLET(3.125 MG) BY MOUTH TWICE DAILY WITH A MEAL   Continuous Blood Gluc Receiver (FREESTYLE LIBRE 2 READER) DEVI 1 Act by Does not apply route daily.   Continuous Blood Gluc Sensor (FREESTYLE LIBRE 2 SENSOR) MISC USE TO CHECK BLOOD SUGAR AND CHANGE EVERY 14 DAYS   dapagliflozin propanediol (FARXIGA) 10 MG TABS tablet TAKE 1 TABLET(10 MG) BY MOUTH DAILY   gabapentin (NEURONTIN) 300 MG capsule TAKE 1 CAPSULE(300 MG) BY MOUTH TWICE DAILY   Glucagon (GVOKE HYPOPEN 2-PACK) 1 MG/0.2ML SOAJ Inject 1  Act into the skin daily as needed.   HUMALOG KWIKPEN 100 UNIT/ML KwikPen INJECT 10 UNITS UNDER THE SKIN THREE TIMES DAILY AS DIRECTED BY SLIDING SCALE (Patient taking differently: Inject 10-45 Units into the skin 3 (three) times daily. Pt takes with meals)   insulin glargine (LANTUS SOLOSTAR) 100 UNIT/ML Solostar Pen ADMINISTER 55 UNITS UNDER THE SKIN EVERY EVENING   Insulin Pen Needle 31G X 8 MM MISC 1 each by Does not apply route 4 (four) times daily.   pantoprazole (PROTONIX) 40 MG tablet TAKE 1 TABLET(40 MG) BY MOUTH TWICE DAILY BEFORE A MEAL   pramipexole (MIRAPEX) 0.125 MG tablet TAKE 1 TABLET(0.125 MG) BY MOUTH AT BEDTIME   rosuvastatin (CRESTOR) 40 MG tablet TAKE 1 TABLET BY MOUTH EVERY DAY   torsemide (DEMADEX) 20 MG tablet TAKE 1 TABLET(20 MG) BY MOUTH 3 TIMES A WEEK Monday, Wednesday and Friday   warfarin (COUMADIN) 5 MG tablet Take 1 to 1 &1/2  by mouth daily as directed by the coumadin clinic   No current facility-administered medications for this visit. (Other)   REVIEW OF SYSTEMS: ROS   Positive for: Gastrointestinal, Genitourinary, Endocrine, Eyes Negative for: Constitutional, Neurological, Skin, Musculoskeletal, HENT, Cardiovascular, Respiratory, Psychiatric, Allergic/Imm, Heme/Lymph Last edited by Julieanne Cotton, COT on 10/22/2022  7:53 AM.     ALLERGIES Allergies  Allergen Reactions   Morphine And Related  Shortness Of Breath and Other (See Comments)    UNSPECIFIED REACTION "Pt said it was too much"    Latex Rash   PAST MEDICAL HISTORY Past Medical History:  Diagnosis Date   AKI (acute kidney injury)    With STEMI in 2017   Chronic systolic CHF (congestive heart failure)    Depression    Diabetes mellitus without complication    Diabetic retinopathy    GERD (gastroesophageal reflux disease)    Hx of adenomatous colonic polyps 04/07/2018   Hyperlipidemia    Hypertension    Hypertensive retinopathy    Paroxysmal atrial fibrillation    Peripheral  vascular disease    Pneumonia    Seizures    hx of as a child   STEMI (ST elevation myocardial infarction) 2017   Past Surgical History:  Procedure Laterality Date   ABDOMINAL AORTOGRAM W/LOWER EXTREMITY N/A 09/19/2016   Procedure: Abdominal Aortogram w/Lower Extremity;  Surgeon: Iran Ouch, MD;  Location: [facility] INVASIVE CV LAB;  Service: Cardiovascular;  Laterality: N/A;   AMPUTATION Right 03/23/2016   Procedure: 1st and 2nd Ray Amputation Right Foot;  Surgeon: Nadara Mustard, MD;  Location: [facility] OR;  Service: Orthopedics;  Laterality: Right;   AMPUTATION Right 06/21/2016   Procedure: RIGHT TRANSMETATARSAL AMPUTATION;  Surgeon: Nadara Mustard, MD;  Location: [facility] OR;  Service: Orthopedics;  Laterality: Right;   CARDIAC CATHETERIZATION N/A 03/13/2016   Procedure: Right/Left Heart Cath and Coronary Angiography;  Surgeon: Tonny Bollman, MD;  Location: [facility] INVASIVE CV LAB;  Service: Cardiovascular;  Laterality: N/A;   CARDIAC CATHETERIZATION N/A 03/13/2016   Procedure: IABP Insertion;  Surgeon: Tonny Bollman, MD;  Location: [facility] INVASIVE CV LAB;  Service: Cardiovascular;  Laterality: N/A;   CATARACT EXTRACTION     CATARACT EXTRACTION, BILATERAL     CERVICAL FUSION  1982, 1992   has had 3 neck surgeries from breaking his neck   CIRCUMCISION N/A 11/07/2021   Procedure: CIRCUMCISION ADULT;  Surgeon: Despina Arias, MD;  Location: [facility] ORS;  Service: Urology;  Laterality: N/A;   CORONARY ARTERY BYPASS GRAFT N/A 03/13/2016   Procedure: CORONARY ARTERY BYPASS GRAFTING (CABG) x 1 (SVG to OM) with EVH from LEFT GREATER SAPHENOUS VEIN;  Surgeon: Kerin Perna, MD;  [facility];  Service: Open Heart Surgery;  Laterality: N/A;   EYE SURGERY     LOWER EXTREMITY ANGIOGRAM  05/02/2016   Procedure: Lower Extremity Angiogram;  Surgeon: Iran Ouch, MD;  Location: [facility] INVASIVE CV LAB;  Service: Cardiovascular;;  Limited left femoral runoff right femoral runoff   PERIPHERAL VASCULAR CATHETERIZATION N/A  05/02/2016   Procedure: Abdominal Aortogram;  Surgeon: Iran Ouch, MD;  Location: [facility] INVASIVE CV LAB;  Service: Cardiovascular;  Laterality: N/A;   PERIPHERAL VASCULAR CATHETERIZATION Right 05/02/2016   Procedure: Peripheral Vascular Balloon Angioplasty;  Surgeon: Iran Ouch, MD;  Location: [facility] INVASIVE CV LAB;  Service: Cardiovascular;  Laterality: Right;  SFA   TEE WITHOUT CARDIOVERSION N/A 03/13/2016   Procedure: TRANSESOPHAGEAL ECHOCARDIOGRAM (TEE);  Surgeon: Kerin Perna, MD;  Location: [facility] OR;  Service: Open Heart Surgery;  Laterality: N/A;   VSD REPAIR N/A 03/13/2016   Procedure: VENTRICULAR SEPTAL DEFECT (VSD) REPAIR;  Surgeon: Kerin Perna, MD;  Location: [facility] OR;  Service: Open Heart Surgery;  Laterality: N/A;   FAMILY HISTORY Family History  Problem Relation Age of Onset   Diabetes Maternal Grandmother    Diabetes Mother    Aneurysm Mother    Peripheral  Artery Disease Mother    Coronary artery disease Mother    Peptic Ulcer Father    Retinoblastoma Daughter    Colon cancer Neg Hx    Rectal cancer Neg Hx    SOCIAL HISTORY Social History   Tobacco Use   Smoking status: Former    Packs/day: .5    Types: Cigarettes    Quit date: 11/20/2015    Years since quitting: 6.9   Smokeless tobacco: Never   Tobacco comments:    quit 2018  Vaping Use   Vaping Use: Never used  Substance Use Topics   Alcohol use: No   Drug use: No       OPHTHALMIC EXAM: Base Eye Exam     Visual Acuity (Snellen - Linear)       Right Left   Dist [place] 20/25 20/25   Dist ph [place] 20/20 NI         Tonometry (Tonopen, 7:56 AM)       Right Left   Pressure 17 17         Pupils       Dark Light Shape React APD   Right 2 1 Round Brisk None   Left 2 1 Round Brisk None         Visual Fields       Left Right    Full Full         Extraocular Movement       Right Left    Full, Ortho Full, Ortho         Neuro/Psych     Oriented x3: Yes   Mood/Affect: Normal          Dilation     Both eyes: 1.0% Mydriacyl, 2.5% Phenylephrine @ 7:53 AM           Slit Lamp and Fundus Exam     Slit Lamp Exam       Right Left   Lids/Lashes Dermatochalasis - upper lid, mild MGD Dermatochalasis - upper lid, mild MGD   Conjunctiva/Sclera White and quiet White and quiet   Cornea 1+fine PEE, well healed cataract wound, mild tear film debris Trace PEE, mild tear film debris, well healed cataract wound   Anterior Chamber Deep and quiet Deep and quiet   Iris Round and dilated, No NVI Round and dilated, No NVI   Lens PC IOL in good position PC IOL in good position   Anterior Vitreous Vitreous syneresis, vitreous condensations Vitreous syneresis, Posterior vitreous detachment, vitreous condensations         Fundus Exam       Right Left   Disc trace Pallor, Sharp rim mild Pallor, Sharp rim, mild tilt   C/D Ratio 0.3 0.3   Macula Flat, good foveal reflex, scatted Microaneurysms, persistent cystic changes, possible targets for focal laser Flat, good foveal reflex, scattered MA, prominent dot heme ST to fovea -- possible focal target   Vessels attenuated, mild tortuosity attenuated, Tortuous   Periphery Attached, +MA greatest posteriorly Attached, scattered MA greatest posteriorly           IMAGING AND PROCEDURES  Imaging and Procedures for 10/22/2022  POCT INR      Component Value Flag Ref Range Units Status   INR 1.8      2.0 - 3.0  Final           OCT, Retina - OU - Both Eyes       Right Eye Quality was good. Central Foveal  Thickness: 253. Progression has improved. Findings include no SRF, abnormal foveal contour, intraretinal hyper-reflective material, intraretinal fluid, vitreomacular adhesion (Mild interval improvement in IRF/cystic changes ).   Left Eye Quality was good. Central Foveal Thickness: 251. Progression has improved. Findings include no SRF, abnormal foveal contour, intraretinal hyper-reflective material, intraretinal fluid  (Mild interval improvement in IRF/ cystic changes temporal fovea and macula ).   Notes *Images captured and stored on drive  Diagnosis / Impression:  +DME OU OD: mild interval improvement in IRF/cystic changes  OS: Mild interval improvement in IRF/ cystic changes temporal fovea and macula   Clinical management:  See below  Abbreviations: NFP - Normal foveal profile. CME - cystoid macular edema. PED - pigment epithelial detachment. IRF - intraretinal fluid. SRF - subretinal fluid. EZ - ellipsoid zone. ERM - epiretinal membrane. ORA - outer retinal atrophy. ORT - outer retinal tubulation. SRHM - subretinal hyper-reflective material. IRHM - intraretinal hyper-reflective material      Intravitreal Injection, Pharmacologic Agent - OD - Right Eye       Time Out 10/22/2022. 8:27 AM. Confirmed correct patient, procedure, site, and patient consented.   Anesthesia Topical anesthesia was used. Anesthetic medications included Lidocaine 2%, Proparacaine 0.5%.   Procedure Preparation included 5% betadine to ocular surface, eyelid speculum. A (32g) needle was used.   Injection: 2 mg aflibercept 2 MG/0.05ML   Route: Intravitreal, Site: Right Eye   NDC: L6038910, Lot: [id number], Expiration date: 10/30/2023, Waste: 0 mL   Post-op Post injection exam found visual acuity of at least counting fingers. The patient tolerated the procedure well. There were no complications. The patient received written and verbal post procedure care education. Post injection medications were not given.      Intravitreal Injection, Pharmacologic Agent - OS - Left Eye       Time Out 10/22/2022. 8:28 AM. Confirmed correct patient, procedure, site, and patient consented.   Anesthesia Topical anesthesia was used. Anesthetic medications included Lidocaine 2%, Proparacaine 0.5%.   Procedure Preparation included 5% betadine to ocular surface, eyelid speculum. A (32g) needle was used.   Injection: 2 mg  aflibercept 2 MG/0.05ML   Route: Intravitreal, Site: Left Eye   NDC: L6038910, Lot: [id number], Expiration date: 09/30/2023, Waste: 0 mL   Post-op Post injection exam found visual acuity of at least counting fingers. The patient tolerated the procedure well. There were no complications. The patient received written and verbal post procedure care education. Post injection medications were not given.            ASSESSMENT/PLAN:    ICD-10-CM   1. Moderate nonproliferative diabetic retinopathy of both eyes with macular edema associated with type 2 diabetes mellitus  E11.3313 OCT, Retina - OU - Both Eyes    Intravitreal Injection, Pharmacologic Agent - OD - Right Eye    Intravitreal Injection, Pharmacologic Agent - OS - Left Eye    aflibercept (EYLEA) SOLN 2 mg    aflibercept (EYLEA) SOLN 2 mg    2. Essential hypertension  I10     3. Hypertensive retinopathy of both eyes  H35.033     4. Pseudophakia, both eyes  Z96.1      1. Moderate non-proliferative diabetic retinopathy, both eyes  - last A1c was 8.0 on 11.06.23  - s/p IVA OD #1 (11.14.22), #2 (12.12.22), #3 (01.09.23) -- IVA resistance - s/p IVA OS #1 (10.17.22), #2 (11.14.22), #3 (12.12.22), #4 (01.09.23), #5 (03.15.23), #6 (04.12.23) -- IVA resistance - s/p IVE OD #1 (03.15.23), #2 (04.12.23), #3 (  05.10.23), #4 (06.07.23), #5 (07.05.23), #6 (08.02.23), #7 (08.31.23), #8 (09.28.23), #9 (10.30.23), #10 (11.28.23), #11 (12.28.23), #12 (01.29.24), #13 (02.20.24), #14 (03.25.24) - s/p IVE OS #1 (05.10.23), #2 (06.07.23), #3 (07.05.23), #4 (08.02.23), #5 (08.31.23), #6 (09.28.23), #7 (10.30.23), #8 (11.28.23), #9 (12.28.23), #10 (01.29.24), #11 (02.20..24), #!2 (03.25.24) - exam shows scattered MA, DBH OU - FA (10.17.22) shows late leaking MA OU, no NV OU - OCT shows OD: mild interval improvement in IRF/cystic changes central; OS: Mild interval improvement in IRF/ cystic changes temporal fovea and macula at 4 weeks - discussed  possibility of elevated BP blunting efficacy of IVE - BCVA OD: 20/20, OS: 20/25  - recommend IVE OD #15 and IVE OS #13 today, 04.22.24 for DME with follow up in 4 weeks - pt wishes to proceed - RBA of procedure discussed, questions answered - IVE informed consent obtained and signed, 03.15.23 (OD) - IVE informed consent obtained and signed, 05.10.23 (OS) - see procedure note - f/u in 4 wks -- DFE/OCT, possible injection(s) vs. Focal laser  2,3. Hypertensive retinopathy OU - discussed importance of tight BP control and possibility of elevated BP blunting efficacy of IVE - monitor  4. Pseudophakia OU  - s/p CE/IOL OU  - IOL in good position, doing well  - monitor  Ophthalmic Meds Ordered this visit:  Meds ordered this encounter  Medications   aflibercept (EYLEA) SOLN 2 mg   aflibercept (EYLEA) SOLN 2 mg     Return in about 4 weeks (around 11/19/2022) for f/u NPDR OU, DFE, OCT.  There are no Patient Instructions on file for this visit.  Explained the diagnoses, plan, and follow up with the patient and they expressed understanding.  Patient expressed understanding of the importance of proper follow up care.   This document serves as a record of services personally performed by Karie Chimera, MD, PhD. It was created on their behalf by Annalee Genta, COMT. The creation of this record is the provider's dictation and/or activities during the visit.  Electronically signed by: Annalee Genta, COMT 10/23/22 8:56 PM  This document serves as a record of services personally performed by Karie Chimera, MD, PhD. It was created on their behalf by Glee Arvin. Manson Passey, OA an ophthalmic technician. The creation of this record is the provider's dictation and/or activities during the visit.    Electronically signed by: Glee Arvin. Manson Passey, [place] 04.22.2024 8:56 PM  Karie Chimera, M.D., Ph.D. Diseases & Surgery of the Retina and Vitreous [facility]  I have reviewed the above  documentation for accuracy and completeness, and I agree with the above. Karie Chimera, M.D., Ph.D. 10/23/22 8:58 PM   Abbreviations: M myopia (nearsighted); A astigmatism; H hyperopia (farsighted); P presbyopia; Mrx spectacle prescription;  CTL contact lenses; OD right eye; OS left eye; OU both eyes  XT exotropia; ET esotropia; PEK punctate epithelial keratitis; PEE punctate epithelial erosions; DES dry eye syndrome; MGD meibomian gland dysfunction; ATs artificial tears; PFAT's preservative free artificial tears; NSC nuclear sclerotic cataract; PSC posterior subcapsular cataract; ERM epi-retinal membrane; PVD posterior vitreous detachment; RD retinal detachment; DM diabetes mellitus; DR diabetic retinopathy; NPDR non-proliferative diabetic retinopathy; PDR proliferative diabetic retinopathy; CSME clinically significant macular edema; DME diabetic macular edema; dbh dot blot hemorrhages; CWS cotton wool spot; POAG primary open angle glaucoma; C/D cup-to-disc ratio; HVF humphrey visual field; GVF goldmann visual field; OCT optical coherence tomography; IOP intraocular pressure; BRVO Branch retinal vein occlusion; CRVO central retinal vein occlusion; CRAO  central retinal artery occlusion; BRAO branch retinal artery occlusion; RT retinal tear; SB scleral buckle; PPV pars plana vitrectomy; VH Vitreous hemorrhage; PRP panretinal laser photocoagulation; IVK intravitreal kenalog; VMT vitreomacular traction; MH Macular hole;  NVD neovascularization of the disc; NVE neovascularization elsewhere; AREDS age related eye disease study; ARMD age related macular degeneration; POAG primary open angle glaucoma; EBMD epithelial/anterior basement membrane dystrophy; ACIOL anterior chamber intraocular lens; IOL intraocular lens; PCIOL posterior chamber intraocular lens; Phaco/IOL phacoemulsification with intraocular lens placement; PRK photorefractive keratectomy; LASIK laser assisted in situ keratomileusis; HTN hypertension; DM  diabetes mellitus; COPD chronic obstructive pulmonary disease

## 2022-10-22 ENCOUNTER — Encounter (INDEPENDENT_AMBULATORY_CARE_PROVIDER_SITE_OTHER): Payer: Self-pay | Admitting: Ophthalmology

## 2022-10-22 ENCOUNTER — Ambulatory Visit: Payer: 59 | Attending: Cardiovascular Disease | Admitting: Pharmacist

## 2022-10-22 ENCOUNTER — Ambulatory Visit (INDEPENDENT_AMBULATORY_CARE_PROVIDER_SITE_OTHER): Payer: 59 | Admitting: Ophthalmology

## 2022-10-22 DIAGNOSIS — I480 Paroxysmal atrial fibrillation: Secondary | ICD-10-CM

## 2022-10-22 DIAGNOSIS — Z961 Presence of intraocular lens: Secondary | ICD-10-CM

## 2022-10-22 DIAGNOSIS — H35033 Hypertensive retinopathy, bilateral: Secondary | ICD-10-CM | POA: Diagnosis not present

## 2022-10-22 DIAGNOSIS — I10 Essential (primary) hypertension: Secondary | ICD-10-CM

## 2022-10-22 DIAGNOSIS — E113313 Type 2 diabetes mellitus with moderate nonproliferative diabetic retinopathy with macular edema, bilateral: Secondary | ICD-10-CM | POA: Diagnosis not present

## 2022-10-22 LAB — POCT INR: INR: 1.8 — AB (ref 2.0–3.0)

## 2022-10-22 MED ORDER — AFLIBERCEPT 2MG/0.05ML IZ SOLN FOR KALEIDOSCOPE
2.0000 mg | INTRAVITREAL | Status: AC | PRN
Start: 2022-10-22 — End: 2022-10-22
  Administered 2022-10-22: 2 mg via INTRAVITREAL

## 2022-10-22 NOTE — Patient Instructions (Signed)
Description   Take 1.5 tablets today, then continue taking 1.5 tablets daily except for 1 tablet every Monday, Wednesday and Friday.  Recheck INR in 4 weeks per pt availability.  Anticoagulation Clinic [phone]

## 2022-11-02 DIAGNOSIS — N1832 Chronic kidney disease, stage 3b: Secondary | ICD-10-CM | POA: Diagnosis not present

## 2022-11-05 ENCOUNTER — Encounter: Payer: Self-pay | Admitting: Internal Medicine

## 2022-11-05 ENCOUNTER — Ambulatory Visit (INDEPENDENT_AMBULATORY_CARE_PROVIDER_SITE_OTHER): Payer: 59 | Admitting: Internal Medicine

## 2022-11-05 VITALS — BP 132/68 | HR 72 | Temp 97.8°F | Resp 16 | Ht 65.0 in | Wt 185.0 lb

## 2022-11-05 DIAGNOSIS — E11621 Type 2 diabetes mellitus with foot ulcer: Secondary | ICD-10-CM

## 2022-11-05 DIAGNOSIS — D696 Thrombocytopenia, unspecified: Secondary | ICD-10-CM | POA: Diagnosis not present

## 2022-11-05 DIAGNOSIS — I5022 Chronic systolic (congestive) heart failure: Secondary | ICD-10-CM | POA: Diagnosis not present

## 2022-11-05 DIAGNOSIS — Z794 Long term (current) use of insulin: Secondary | ICD-10-CM | POA: Diagnosis not present

## 2022-11-05 DIAGNOSIS — E875 Hyperkalemia: Secondary | ICD-10-CM | POA: Diagnosis not present

## 2022-11-05 DIAGNOSIS — L97528 Non-pressure chronic ulcer of other part of left foot with other specified severity: Secondary | ICD-10-CM

## 2022-11-05 DIAGNOSIS — I480 Paroxysmal atrial fibrillation: Secondary | ICD-10-CM | POA: Diagnosis not present

## 2022-11-05 DIAGNOSIS — I2510 Atherosclerotic heart disease of native coronary artery without angina pectoris: Secondary | ICD-10-CM

## 2022-11-05 DIAGNOSIS — I2583 Coronary atherosclerosis due to lipid rich plaque: Secondary | ICD-10-CM

## 2022-11-05 DIAGNOSIS — N1832 Chronic kidney disease, stage 3b: Secondary | ICD-10-CM

## 2022-11-05 DIAGNOSIS — E781 Pure hyperglyceridemia: Secondary | ICD-10-CM | POA: Diagnosis not present

## 2022-11-05 DIAGNOSIS — D631 Anemia in chronic kidney disease: Secondary | ICD-10-CM | POA: Diagnosis not present

## 2022-11-05 DIAGNOSIS — E1122 Type 2 diabetes mellitus with diabetic chronic kidney disease: Secondary | ICD-10-CM

## 2022-11-05 DIAGNOSIS — E785 Hyperlipidemia, unspecified: Secondary | ICD-10-CM

## 2022-11-05 DIAGNOSIS — E119 Type 2 diabetes mellitus without complications: Secondary | ICD-10-CM

## 2022-11-05 DIAGNOSIS — I129 Hypertensive chronic kidney disease with stage 1 through stage 4 chronic kidney disease, or unspecified chronic kidney disease: Secondary | ICD-10-CM | POA: Diagnosis not present

## 2022-11-05 DIAGNOSIS — K635 Polyp of colon: Secondary | ICD-10-CM

## 2022-11-05 LAB — CBC WITH DIFFERENTIAL/PLATELET
Basophils Absolute: 0 10*3/uL (ref 0.0–0.1)
Basophils Relative: 0.4 % (ref 0.0–3.0)
Eosinophils Absolute: 0.1 10*3/uL (ref 0.0–0.7)
Eosinophils Relative: 1.8 % (ref 0.0–5.0)
HCT: 43.3 % (ref 39.0–52.0)
Hemoglobin: 14.6 g/dL (ref 13.0–17.0)
Lymphocytes Relative: 35.9 % (ref 12.0–46.0)
Lymphs Abs: 2.2 10*3/uL (ref 0.7–4.0)
MCHC: 33.7 g/dL (ref 30.0–36.0)
MCV: 86.9 fl (ref 78.0–100.0)
Monocytes Absolute: 0.5 10*3/uL (ref 0.1–1.0)
Monocytes Relative: 7.8 % (ref 3.0–12.0)
Neutro Abs: 3.3 10*3/uL (ref 1.4–7.7)
Neutrophils Relative %: 54.1 % (ref 43.0–77.0)
Platelets: 173 10*3/uL (ref 150.0–400.0)
RBC: 4.99 Mil/uL (ref 4.22–5.81)
RDW: 14.6 % (ref 11.5–15.5)
WBC: 6.1 10*3/uL (ref 4.0–10.5)

## 2022-11-05 LAB — LIPID PANEL
Cholesterol: 126 mg/dL (ref 0–200)
HDL: 32.1 mg/dL — ABNORMAL LOW (ref >39.00)
NonHDL: 94.16
Total CHOL/HDL Ratio: 4
Triglycerides: 290 mg/dL — ABNORMAL HIGH (ref 0.0–149.0)
VLDL: 58 mg/dL — ABNORMAL HIGH (ref 0.0–40.0)

## 2022-11-05 LAB — LDL CHOLESTEROL, DIRECT: Direct LDL: 54 mg/dL

## 2022-11-05 LAB — HEMOGLOBIN A1C: Hgb A1c MFr Bld: 7.9 % — ABNORMAL HIGH (ref 4.6–6.5)

## 2022-11-05 LAB — TSH: TSH: 2.32 u[IU]/mL (ref 0.35–5.50)

## 2022-11-05 MED ORDER — ICOSAPENT ETHYL 1 G PO CAPS: 2.0000 g | ORAL_CAPSULE | Freq: Two times a day (BID) | ORAL | 1 refills | Status: DC

## 2022-11-05 NOTE — Patient Instructions (Signed)

## 2022-11-05 NOTE — Progress Notes (Signed)
Subjective:  Patient ID: William [name], male    DOB: 08/19/1950  Age: 72 y.o. MRN: [id number]  CC: Hyperlipidemia and Diabetes   HPI Zekiel Ferrelli presents for f/up -    He denies chest pain, shortness of breath, diaphoresis, or edema.  He has chronic unchanged low back pain that radiates into his lower extremities but he denies numbness, weakness, or tingling.  He recently had lab work done by his nephrologist and saw his nephrologist earlier today.   Outpatient Medications Prior to Visit  Medication Sig Dispense Refill   albuterol (PROVENTIL HFA;VENTOLIN HFA) 108 (90 Base) MCG/ACT inhaler Inhale 2 puffs into the lungs every 6 (six) hours as needed for wheezing or shortness of breath. 1 Inhaler 0   aspirin EC 81 MG tablet Take 1 tablet (81 mg total) by mouth daily. 30 tablet 3   carvedilol (COREG) 3.125 MG tablet TAKE 1 TABLET(3.125 MG) BY MOUTH TWICE DAILY WITH A MEAL 180 tablet 1   Continuous Blood Gluc Receiver (FREESTYLE LIBRE 2 READER) DEVI 1 Act by Does not apply route daily. 2 each 5   Continuous Blood Gluc Sensor (FREESTYLE LIBRE 2 SENSOR) MISC USE TO CHECK BLOOD SUGAR AND CHANGE EVERY 14 DAYS 2 each 5   gabapentin (NEURONTIN) 300 MG capsule TAKE 1 CAPSULE(300 MG) BY MOUTH TWICE DAILY 180 capsule 1   Glucagon (GVOKE HYPOPEN 2-PACK) 1 MG/0.2ML SOAJ Inject 1 Act into the skin daily as needed. 2 mL 5   HUMALOG KWIKPEN 100 UNIT/ML KwikPen INJECT 10 UNITS UNDER THE SKIN THREE TIMES DAILY AS DIRECTED BY SLIDING SCALE (Patient taking differently: Inject 10-45 Units into the skin 3 (three) times daily. Pt takes with meals) 27 mL 1   insulin glargine (LANTUS SOLOSTAR) 100 UNIT/ML Solostar Pen ADMINISTER 55 UNITS UNDER THE SKIN EVERY EVENING 51 mL 0   Insulin Pen Needle 31G X 8 MM MISC 1 each by Does not apply route 4 (four) times daily. 300 each 2   pantoprazole (PROTONIX) 40 MG tablet TAKE 1 TABLET(40 MG) BY MOUTH TWICE DAILY BEFORE A MEAL 180 tablet 0   pramipexole (MIRAPEX) 0.125  MG tablet TAKE 1 TABLET(0.125 MG) BY MOUTH AT BEDTIME 90 tablet 1   rosuvastatin (CRESTOR) 40 MG tablet TAKE 1 TABLET BY MOUTH EVERY DAY 90 tablet 1   torsemide (DEMADEX) 20 MG tablet TAKE 1 TABLET(20 MG) BY MOUTH 3 TIMES A WEEK Monday, Wednesday and Friday 38 tablet 1   warfarin (COUMADIN) 5 MG tablet Take 1 to 1 &1/2  by mouth daily as directed by the coumadin clinic 120 tablet 0   dapagliflozin propanediol (FARXIGA) 10 MG TABS tablet TAKE 1 TABLET(10 MG) BY MOUTH DAILY 90 tablet 0   No facility-administered medications prior to visit.    ROS Review of Systems  Constitutional: Negative.  Negative for appetite change, diaphoresis and fatigue.  HENT: Negative.    Eyes: Negative.   Respiratory:  Negative for cough, chest tightness, shortness of breath and wheezing.   Cardiovascular:  Negative for chest pain, palpitations and leg swelling.  Gastrointestinal:  Negative for abdominal pain, constipation, diarrhea, nausea and vomiting.  Endocrine: Negative.   Genitourinary: Negative.  Negative for difficulty urinating and dysuria.  Musculoskeletal:  Positive for back pain. Negative for arthralgias and myalgias.  Skin: Negative.  Negative for color change and pallor.  Neurological:  Negative for dizziness and weakness.  Hematological:  Negative for adenopathy. Does not bruise/bleed easily.  Psychiatric/Behavioral: Negative.  Objective:  BP 132/68 (BP Location: Right Arm, Patient Position: Sitting, Cuff Size: Large)   Pulse 72   Temp 97.8 F (36.6 C) (Oral)   Resp 16   Ht 5\' 5"  (1.651 m)   Wt 185 lb (83.9 kg)   SpO2 90%   BMI 30.79 kg/m   BP Readings from Last 3 Encounters:  11/05/22 132/68  05/08/22 (!) 96/49  05/07/22 128/74    Wt Readings from Last 3 Encounters:  11/05/22 185 lb (83.9 kg)  05/08/22 180 lb (81.6 kg)  05/07/22 179 lb (81.2 kg)    Physical Exam Vitals reviewed.  Constitutional:      Appearance: Normal appearance.  HENT:     Nose: Nose normal.      Mouth/Throat:     Mouth: Mucous membranes are moist.  Eyes:     General: No scleral icterus.    Conjunctiva/sclera: Conjunctivae normal.  Cardiovascular:     Rate and Rhythm: Normal rate and regular rhythm.     Heart sounds: No murmur heard. Pulmonary:     Effort: Pulmonary effort is normal.     Breath sounds: No stridor. No wheezing, rhonchi or rales.  Abdominal:     General: Abdomen is flat.     Palpations: There is no mass.     Tenderness: There is no abdominal tenderness. There is no guarding.     Hernia: No hernia is present.  Musculoskeletal:        General: Normal range of motion.     Cervical back: Neck supple.     Right lower leg: No edema.     Left lower leg: No edema.  Lymphadenopathy:     Cervical: No cervical adenopathy.  Skin:    General: Skin is warm.  Neurological:     General: No focal deficit present.     Mental Status: He is alert. Mental status is at baseline.  Psychiatric:        Mood and Affect: Mood normal.        Behavior: Behavior normal.     Lab Results  Component Value Date   WBC 6.1 11/05/2022   HGB 14.6 11/05/2022   HCT 43.3 11/05/2022   PLT 173.0 11/05/2022   GLUCOSE 190 (H) 11/06/2021   CHOL 126 11/05/2022   TRIG 290.0 (H) 11/05/2022   HDL 32.10 (L) 11/05/2022   LDLDIRECT 54.0 11/05/2022   LDLCALC 43 05/07/2022   ALT 35 05/07/2022   AST 31 05/07/2022   NA 139 11/06/2021   K 4.8 11/06/2021   CL 107 11/06/2021   CREATININE 1.57 (H) 11/06/2021   BUN 27 (H) 11/06/2021   CO2 26 11/06/2021   TSH 2.32 11/05/2022   PSA 3.50 05/07/2022   INR 1.8 (A) 10/22/2022   HGBA1C 7.9 (H) 11/05/2022   MICROALBUR <0.7 05/07/2022    VAS [place] ABI WITH/WO TBI  Result Date: 01/29/2022  LOWER EXTREMITY DOPPLER STUDY Patient Name:  William [name]  Date of Exam:   01/29/2022 Medical Rec #: [id number]    Accession #:    [id number] Date of Birth: January 11, 1951   Patient Gender: M Patient Age:   54 years Exam Location:  [facility] Procedure:      VAS [place] ABI WITH/WO  TBI Referring Phys: [facility] ARIDA --------------------------------------------------------------------------------  Indications: Peripheral artery disease. High Risk Factors: Hypertension, hyperlipidemia, Diabetes, past history of                    smoking, coronary artery disease.  Vascular  Interventions: 05/2016-right distal SFA angioplasty. S/P                         transmetatarsal amputation of foot, right. Comparison Study: Previous ABIs performed 01/25/21 were 0.89 on the right and                   0.85 on the left. Performing Technologist: Olegario Shearer RVT  Examination Guidelines: A complete evaluation includes at minimum, Doppler waveform signals and systolic blood pressure reading at the level of bilateral brachial, anterior tibial, and posterior tibial arteries, when vessel segments are accessible. Bilateral testing is considered an integral part of a complete examination. Photoelectric Plethysmograph (PPG) waveforms and toe systolic pressure readings are included as required and additional duplex testing as needed. Limited examinations for reoccurring indications may be performed as noted.  ABI Findings: +---------+------------------+-----+----------+--------------------------+ Right    Rt Pressure (mmHg)IndexWaveform  Comment                    +---------+------------------+-----+----------+--------------------------+ Brachial 122                                                         +---------+------------------+-----+----------+--------------------------+ PTA      70                0.56 monophasic                           +---------+------------------+-----+----------+--------------------------+ PERO     118               0.94 monophasic                           +---------+------------------+-----+----------+--------------------------+ DP       115               0.92 monophasic                            +---------+------------------+-----+----------+--------------------------+ Great Toe                                 transmetatarsal amputation +---------+------------------+-----+----------+--------------------------+ +---------+------------------+-----+----------+-------+ Left     Lt Pressure (mmHg)IndexWaveform  Comment +---------+------------------+-----+----------+-------+ Brachial 125                                      +---------+------------------+-----+----------+-------+ PTA      112               0.90 monophasic        +---------+------------------+-----+----------+-------+ PERO     113               0.90 monophasic        +---------+------------------+-----+----------+-------+ DP       112               0.90 monophasic        +---------+------------------+-----+----------+-------+ Great Toe55                0.44 Abnormal          +---------+------------------+-----+----------+-------+ +-------+-----------+-----------+------------+------------+  ABI/TBIToday's ABIToday's TBIPrevious ABIPrevious TBI +-------+-----------+-----------+------------+------------+ Right  0.94       amp.       0.89        amp.         +-------+-----------+-----------+------------+------------+ Left   0.90       0.44       0.85        0.54         +-------+-----------+-----------+------------+------------+ Bilateral ABIs appear essentially unchanged compared to prior study on 01/25/21.  Summary: Right: Resting right ankle-brachial index indicates mild right lower extremity arterial disease. Left: Resting left ankle-brachial index indicates mild left lower extremity arterial disease. The left toe-brachial index is abnormal. *See table(s) above for measurements and observations. See arterial duplex report. Suggest follow up study in 12 months. Electronically signed by Lance Muss MD on 01/29/2022 at 8:50:30 PM.    Final    VAS [place] LOWER EXTREMITY ARTERIAL  DUPLEX  Result Date: 01/29/2022 LOWER EXTREMITY ARTERIAL DUPLEX STUDY Patient Name:  CASPER VERDONE  Date of Exam:   01/29/2022 Medical Rec #: [id number]    Accession #:    [id number] Date of Birth: 08/21/1950   Patient Gender: M Patient Age:   60 years Exam Location:  [facility] Procedure:      VAS [place] LOWER EXTREMITY ARTERIAL DUPLEX Referring Phys: [facility] ARIDA --------------------------------------------------------------------------------  Indications: Peripheral artery disease. Patient denies claudication symptoms or              rest pain at this time. High Risk Factors: Hypertension, hyperlipidemia, Diabetes, past history of                    smoking.  Vascular Interventions: 05/2016-right distal SFA angioplasty. S/P                         transmetatarsal amputation of foot, right. Current ABI:            Right-0.94                         Left- 0.90 Comparison Study: Previous arterial duplex 01/25/21 showed right SFA prox 50-74%                   stenosis and 30-49% popliteal stenosis. Left CFA and SFA                   30-49% stenosis and left proximal popliteal 75-99% stenosis. Performing Technologist: Olegario Shearer RVT  Examination Guidelines: A complete evaluation includes B-mode imaging, spectral Doppler, color Doppler, and power Doppler as needed of all accessible portions of each vessel. Bilateral testing is considered an integral part of a complete examination. Limited examinations for reoccurring indications may be performed as noted.  +----------+--------+-----+---------------+--------+--------+ RIGHT     PSV cm/sRatioStenosis       WaveformComments +----------+--------+-----+---------------+--------+--------+ CFA Prox  137                         biphasic         +----------+--------+-----+---------------+--------+--------+ CFA Distal113                         biphasic         +----------+--------+-----+---------------+--------+--------+ DFA       137  biphasic         +----------+--------+-----+---------------+--------+--------+ SFA Prox  305     3.7  50-74% stenosisbiphasic         +----------+--------+-----+---------------+--------+--------+ SFA Mid   151                         biphasic         +----------+--------+-----+---------------+--------+--------+ SFA Distal78                          biphasic         +----------+--------+-----+---------------+--------+--------+ POP Prox  101                         biphasic         +----------+--------+-----+---------------+--------+--------+ POP Mid   138                         biphasic         +----------+--------+-----+---------------+--------+--------+ POP Distal60                          biphasic         +----------+--------+-----+---------------+--------+--------+ TP Trunk  85                          biphasic         +----------+--------+-----+---------------+--------+--------+ A focal velocity elevation of 305 cm/s was obtained at proximal SFA with post stenotic turbulence with a VR of 3.7. Findings are characteristic of 50-74% stenosis.  Summary: Right: 50-74% stenosis noted in the proximal superficial femoral artery. No significant change compared to previous study.  See table(s) above for measurements and observations. See ABI report. Suggest follow up study in 12 months. Electronically signed by Lance Muss MD on 01/29/2022 at 8:50:09 PM.    Final     Assessment & Plan:   Stage 3b chronic kidney disease ([name]) -     Dapagliflozin Propanediol; TAKE 1 TABLET(10 MG) BY MOUTH DAILY  Dispense: 90 tablet; Refill: 1  Paroxysmal atrial fibrillation ([name]) -     TSH; Future  Thrombocytopenia ([name])- Platelets are normal now. -     CBC with Differential/Platelet; Future  Type 2 diabetes mellitus with stage 3b chronic kidney disease, without long-term current use of insulin ([name])- His blood sugar is adequately well-controlled. -     Hemoglobin  A1c; Future -     Dapagliflozin Propanediol; TAKE 1 TABLET(10 MG) BY MOUTH DAILY  Dispense: 90 tablet; Refill: 1  Diabetic ulcer of toe of left foot associated with type 2 diabetes mellitus, with other ulcer severity ([name]) -     Ambulatory referral to Podiatry  Coronary artery disease due to lipid rich plaque -     Ambulatory referral to Cardiology -     Icosapent Ethyl; Take 2 capsules (2 g total) by mouth 2 (two) times daily.  Dispense: 360 capsule; Refill: 1  Chronic systolic HF (heart failure) ([name]) -     Ambulatory referral to Cardiology -     Dapagliflozin Propanediol; TAKE 1 TABLET(10 MG) BY MOUTH DAILY  Dispense: 90 tablet; Refill: 1  Hyperlipidemia LDL goal <70 - LDL goal achieved. Doing well on the statin  -     Lipid panel; Future  Polyp of colon, unspecified part of colon, unspecified type -  Ambulatory referral to Gastroenterology  Pure hyperglyceridemia -     Icosapent Ethyl; Take 2 capsules (2 g total) by mouth 2 (two) times daily.  Dispense: 360 capsule; Refill: 1  Type 2 diabetes mellitus with complication, with long-term current use of insulin ([name]) -     Dapagliflozin Propanediol; TAKE 1 TABLET(10 MG) BY MOUTH DAILY  Dispense: 90 tablet; Refill: 1  Insulin-requiring or dependent type II diabetes mellitus ([name])  Other orders -     LDL cholesterol, direct     Follow-up: Return in about 6 months (around 05/08/2023).  Sanda Linger, MD

## 2022-11-06 ENCOUNTER — Encounter: Payer: Self-pay | Admitting: Internal Medicine

## 2022-11-08 MED ORDER — DAPAGLIFLOZIN PROPANEDIOL 10 MG PO TABS
ORAL_TABLET | ORAL | 1 refills | Status: DC
Start: 2022-11-08 — End: 2023-05-16

## 2022-11-14 NOTE — Progress Notes (Signed)
[facility] - Clinic Note  11/19/2022    CHIEF COMPLAINT Patient presents for Retina Follow Up  HISTORY OF PRESENT ILLNESS: William [name] is a 72 y.o. male who presents to the clinic today for:   HPI     Retina Follow Up   Patient presents with  Diabetic Retinopathy.  In both eyes.  This started 4 weeks ago.  I, the attending physician,  performed the HPI with the patient and updated documentation appropriately.        Comments   Patient here for 4 weeks retina follow up for NPDR OU. Patient states vision doing ok. No eye pain. Put on new medicine. Vascepa.      Last edited by Rennis Chris, MD on 11/19/2022 12:56 PM.    Pt states   Referring physician: [name], MD [address] [place],  [place] [address]  HISTORICAL INFORMATION:  Selected notes from the MEDICAL RECORD NUMBER Referred by Dr. Alben Spittle for eval of DME OU LEE:  Ocular Hx- PMH-    CURRENT MEDICATIONS: No current outpatient medications on file. (Ophthalmic Drugs)   No current facility-administered medications for this visit. (Ophthalmic Drugs)   Current Outpatient Medications (Other)  Medication Sig   albuterol (PROVENTIL HFA;VENTOLIN HFA) 108 (90 Base) MCG/ACT inhaler Inhale 2 puffs into the lungs every 6 (six) hours as needed for wheezing or shortness of breath.   aspirin EC 81 MG tablet Take 1 tablet (81 mg total) by mouth daily.   carvedilol (COREG) 3.125 MG tablet TAKE 1 TABLET(3.125 MG) BY MOUTH TWICE DAILY WITH A MEAL   Continuous Blood Gluc Receiver (FREESTYLE LIBRE 2 READER) DEVI 1 Act by Does not apply route daily.   Continuous Blood Gluc Sensor (FREESTYLE LIBRE 2 SENSOR) MISC USE TO CHECK BLOOD SUGAR AND CHANGE EVERY 14 DAYS   dapagliflozin propanediol (FARXIGA) 10 MG TABS tablet TAKE 1 TABLET(10 MG) BY MOUTH DAILY   gabapentin (NEURONTIN) 300 MG capsule TAKE 1 CAPSULE(300 MG) BY MOUTH TWICE DAILY   Glucagon (GVOKE HYPOPEN 2-PACK) 1 MG/0.2ML SOAJ Inject 1 Act into  the skin daily as needed.   HUMALOG KWIKPEN 100 UNIT/ML KwikPen INJECT 10 UNITS UNDER THE SKIN THREE TIMES DAILY AS DIRECTED BY SLIDING SCALE (Patient taking differently: Inject 10-45 Units into the skin 3 (three) times daily. Pt takes with meals)   icosapent Ethyl (VASCEPA) 1 g capsule Take 2 capsules (2 g total) by mouth 2 (two) times daily.   insulin glargine (LANTUS SOLOSTAR) 100 UNIT/ML Solostar Pen ADMINISTER 55 UNITS UNDER THE SKIN EVERY EVENING   Insulin Pen Needle 31G X 8 MM MISC 1 each by Does not apply route 4 (four) times daily.   pantoprazole (PROTONIX) 40 MG tablet TAKE 1 TABLET(40 MG) BY MOUTH TWICE DAILY BEFORE A MEAL   pramipexole (MIRAPEX) 0.125 MG tablet TAKE 1 TABLET(0.125 MG) BY MOUTH AT BEDTIME   rosuvastatin (CRESTOR) 40 MG tablet TAKE 1 TABLET BY MOUTH EVERY DAY   torsemide (DEMADEX) 20 MG tablet TAKE 1 TABLET(20 MG) BY MOUTH 3 TIMES A WEEK Monday, Wednesday and Friday   warfarin (COUMADIN) 5 MG tablet Take 1 to 1 &1/2  by mouth daily as directed by the coumadin clinic   Castellani Paint 1.5 % LIQD Apply 1 application  topically 2 (two) times daily.   silver sulfADIAZINE (SILVADENE) 1 % cream Apply 1 Application topically daily.   No current facility-administered medications for this visit. (Other)   REVIEW OF SYSTEMS: ROS  Positive for: Gastrointestinal, Genitourinary, Endocrine, Eyes Negative for: Constitutional, Neurological, Skin, Musculoskeletal, HENT, Cardiovascular, Respiratory, Psychiatric, Allergic/Imm, Heme/Lymph Last edited by Laddie Aquas, COA on 11/19/2022  7:56 AM.     ALLERGIES Allergies  Allergen Reactions   Morphine And Codeine Shortness Of Breath and Other (See Comments)    UNSPECIFIED REACTION "Pt said it was too much"    Latex Rash   PAST MEDICAL HISTORY Past Medical History:  Diagnosis Date   AKI (acute kidney injury) ([name])    With STEMI in 2017   Chronic systolic CHF (congestive heart failure) ([name])    Depression    Diabetes  mellitus without complication ([name])    Diabetic retinopathy ([name])    GERD (gastroesophageal reflux disease)    Hx of adenomatous colonic polyps 04/07/2018   Hyperlipidemia    Hypertension    Hypertensive retinopathy    Paroxysmal atrial fibrillation ([name])    Peripheral vascular disease ([name])    Pneumonia    Seizures ([name])    hx of as a child   STEMI (ST elevation myocardial infarction) ([name]) 2017   Past Surgical History:  Procedure Laterality Date   ABDOMINAL AORTOGRAM W/LOWER EXTREMITY N/A 09/19/2016   Procedure: Abdominal Aortogram w/Lower Extremity;  Surgeon: Iran Ouch, MD;  Location: [facility] INVASIVE CV LAB;  Service: Cardiovascular;  Laterality: N/A;   AMPUTATION Right 03/23/2016   Procedure: 1st and 2nd Ray Amputation Right Foot;  Surgeon: Nadara Mustard, MD;  Location: [facility] OR;  Service: Orthopedics;  Laterality: Right;   AMPUTATION Right 06/21/2016   Procedure: RIGHT TRANSMETATARSAL AMPUTATION;  Surgeon: Nadara Mustard, MD;  Location: [facility] OR;  Service: Orthopedics;  Laterality: Right;   CARDIAC CATHETERIZATION N/A 03/13/2016   Procedure: Right/Left Heart Cath and Coronary Angiography;  Surgeon: Tonny Bollman, MD;  Location: [facility] INVASIVE CV LAB;  Service: Cardiovascular;  Laterality: N/A;   CARDIAC CATHETERIZATION N/A 03/13/2016   Procedure: IABP Insertion;  Surgeon: Tonny Bollman, MD;  Location: [facility] INVASIVE CV LAB;  Service: Cardiovascular;  Laterality: N/A;   CATARACT EXTRACTION     CATARACT EXTRACTION, BILATERAL     CERVICAL FUSION  1982, 1992   has had 3 neck surgeries from breaking his neck   CIRCUMCISION N/A 11/07/2021   Procedure: CIRCUMCISION ADULT;  Surgeon: Despina Arias, MD;  Location: [facility] ORS;  Service: Urology;  Laterality: N/A;   CORONARY ARTERY BYPASS GRAFT N/A 03/13/2016   Procedure: CORONARY ARTERY BYPASS GRAFTING (CABG) x 1 (SVG to OM) with EVH from LEFT GREATER SAPHENOUS VEIN;  Surgeon: Kerin Perna, MD;  Location: [facility] OR;  Service: Open Heart Surgery;   Laterality: N/A;   EYE SURGERY     LOWER EXTREMITY ANGIOGRAM  05/02/2016   Procedure: Lower Extremity Angiogram;  Surgeon: Iran Ouch, MD;  Location: [facility] INVASIVE CV LAB;  Service: Cardiovascular;;  Limited left femoral runoff right femoral runoff   PERIPHERAL VASCULAR CATHETERIZATION N/A 05/02/2016   Procedure: Abdominal Aortogram;  Surgeon: Iran Ouch, MD;  Location: [facility] INVASIVE CV LAB;  Service: Cardiovascular;  Laterality: N/A;   PERIPHERAL VASCULAR CATHETERIZATION Right 05/02/2016   Procedure: Peripheral Vascular Balloon Angioplasty;  Surgeon: Iran Ouch, MD;  Location: [facility] INVASIVE CV LAB;  Service: Cardiovascular;  Laterality: Right;  SFA   TEE WITHOUT CARDIOVERSION N/A 03/13/2016   Procedure: TRANSESOPHAGEAL ECHOCARDIOGRAM (TEE);  Surgeon: Kerin Perna, MD;  Location: [facility] OR;  Service: Open Heart Surgery;  Laterality: N/A;   VSD REPAIR N/A 03/13/2016   Procedure: VENTRICULAR SEPTAL  DEFECT (VSD) REPAIR;  Surgeon: Kerin Perna, MD;  Location: [facility] OR;  Service: Open Heart Surgery;  Laterality: N/A;   FAMILY HISTORY Family History  Problem Relation Age of Onset   Diabetes Maternal Grandmother    Diabetes Mother    Aneurysm Mother    Peripheral Artery Disease Mother    Coronary artery disease Mother    Peptic Ulcer Father    Retinoblastoma Daughter    Colon cancer Neg Hx    Rectal cancer Neg Hx    SOCIAL HISTORY Social History   Tobacco Use   Smoking status: Former    Packs/day: .5    Types: Cigarettes    Quit date: 11/20/2015    Years since quitting: 7.0   Smokeless tobacco: Never   Tobacco comments:    quit 2018  Vaping Use   Vaping Use: Never used  Substance Use Topics   Alcohol use: No   Drug use: No       OPHTHALMIC EXAM: Base Eye Exam     Visual Acuity (Snellen - Linear)       Right Left   Dist [place] 20/20 20/20         Tonometry (Tonopen, 7:54 AM)       Right Left   Pressure 12 12         Pupils       Dark Light Shape React  APD   Right 2 1 Round Brisk None   Left 2 1 Round Brisk None         Visual Fields (Counting fingers)       Left Right    Full Full         Extraocular Movement       Right Left    Full, Ortho Full, Ortho         Neuro/Psych     Oriented x3: Yes   Mood/Affect: Normal         Dilation     Both eyes: 1.0% Mydriacyl, 2.5% Phenylephrine @ 7:54 AM           Slit Lamp and Fundus Exam     Slit Lamp Exam       Right Left   Lids/Lashes Dermatochalasis - upper lid, mild MGD Dermatochalasis - upper lid, mild MGD   Conjunctiva/Sclera White and quiet White and quiet   Cornea 1+fine PEE, well healed cataract wound, mild tear film debris Trace PEE, mild tear film debris, well healed cataract wound   Anterior Chamber Deep and quiet Deep and quiet   Iris Round and dilated, No NVI Round and dilated, No NVI   Lens PC IOL in good position PC IOL in good position   Anterior Vitreous Vitreous syneresis, vitreous condensations Vitreous syneresis, Posterior vitreous detachment, vitreous condensations         Fundus Exam       Right Left   Disc trace Pallor, Sharp rim mild Pallor, Sharp rim, mild tilt   C/D Ratio 0.3 0.3   Macula Flat, good foveal reflex, scatted Microaneurysms, persistent cystic changes, possible targets for focal laser temporally and superiorly Flat, good foveal reflex, scattered MA, prominent dot heme ST to fovea -- possible focal target   Vessels attenuated, mild tortuosity attenuated, Tortuous   Periphery Attached, +MA greatest posteriorly Attached, scattered MA greatest posteriorly           IMAGING AND PROCEDURES  Imaging and Procedures for 11/19/2022  POCT INR      Component  Value Flag Ref Range Units Status   INR 2.2      2.0 - 3.0  Final   POC INR                    OCT, Retina - OU - Both Eyes       Right Eye Quality was good. Central Foveal Thickness: 248. Progression has been stable. Findings include no SRF, abnormal foveal  contour, intraretinal hyper-reflective material, intraretinal fluid, vitreomacular adhesion (Persistent IRF/cystic changes greatest superior and inferior mac).   Left Eye Quality was good. Central Foveal Thickness: 251. Progression has been stable. Findings include no SRF, abnormal foveal contour, intraretinal hyper-reflective material, intraretinal fluid (Persistent IRF/ cystic changes temporal fovea and macula ).   Notes *Images captured and stored on drive  Diagnosis / Impression:  +DME OU OD: Persistent IRF/cystic changes greatest superior and inferior mac OS: persistent IRF/ cystic changes temporal fovea and macula   Clinical management:  See below  Abbreviations: NFP - Normal foveal profile. CME - cystoid macular edema. PED - pigment epithelial detachment. IRF - intraretinal fluid. SRF - subretinal fluid. EZ - ellipsoid zone. ERM - epiretinal membrane. ORA - outer retinal atrophy. ORT - outer retinal tubulation. SRHM - subretinal hyper-reflective material. IRHM - intraretinal hyper-reflective material      Intravitreal Injection, Pharmacologic Agent - OD - Right Eye       Time Out 11/19/2022. 8:36 AM. Confirmed correct patient, procedure, site, and patient consented.   Anesthesia Topical anesthesia was used. Anesthetic medications included Lidocaine 2%, Proparacaine 0.5%.   Procedure Preparation included 5% betadine to ocular surface, eyelid speculum. A (32g) needle was used.   Injection: 2 mg aflibercept 2 MG/0.05ML   Route: Intravitreal, Site: Right Eye   NDC: L6038910, Lot: [id number], Expiration date: 10/30/2023, Waste: 0 mL   Post-op Post injection exam found visual acuity of at least counting fingers. The patient tolerated the procedure well. There were no complications. The patient received written and verbal post procedure care education. Post injection medications were not given.      Intravitreal Injection, Pharmacologic Agent - OS - Left Eye       Time  Out 11/19/2022. 8:37 AM. Confirmed correct patient, procedure, site, and patient consented.   Anesthesia Topical anesthesia was used. Anesthetic medications included Lidocaine 2%, Proparacaine 0.5%.   Procedure Preparation included 5% betadine to ocular surface, eyelid speculum. A (32g) needle was used.   Injection: 2 mg aflibercept 2 MG/0.05ML   Route: Intravitreal, Site: Left Eye   NDC: L6038910, Lot: [id number], Expiration date: 10/30/2023, Waste: 0 mL   Post-op Post injection exam found visual acuity of at least counting fingers. The patient tolerated the procedure well. There were no complications. The patient received written and verbal post procedure care education. Post injection medications were not given.            ASSESSMENT/PLAN:    ICD-10-CM   1. Moderate nonproliferative diabetic retinopathy of both eyes with macular edema associated with type 2 diabetes mellitus ([name])  E11.3313 OCT, Retina - OU - Both Eyes    Intravitreal Injection, Pharmacologic Agent - OD - Right Eye    Intravitreal Injection, Pharmacologic Agent - OS - Left Eye    aflibercept (EYLEA) SOLN 2 mg    aflibercept (EYLEA) SOLN 2 mg    2. Current use of insulin ([name])  Z79.4     3. Essential hypertension  I10     4. Hypertensive retinopathy of both eyes  H35.033     5. Pseudophakia, both eyes  Z96.1       1,2. Moderate non-proliferative diabetic retinopathy, both eyes  - last A1c was 8.0 on 11.06.23  - s/p IVA OD #1 (11.14.22), #2 (12.12.22), #3 (01.09.23) -- IVA resistance - s/p IVA OS #1 (10.17.22), #2 (11.14.22), #3 (12.12.22), #4 (01.09.23), #5 (03.15.23), #6 (04.12.23) -- IVA resistance - s/p IVE OD #1 (03.15.23), #2 (04.12.23), #3 (05.10.23), #4 (06.07.23), #5 (07.05.23), #6 (08.02.23), #7 (08.31.23), #8 (09.28.23), #9 (10.30.23), #10 (11.28.23), #11 (12.28.23), #12 (01.29.24), #13 (02.20.24), #14 (03.25.24), #15 (04.22.24) - s/p IVE OS #1 (05.10.23), #2 (06.07.23), #3 (07.05.23), #4  (08.02.23), #5 (08.31.23), #6 (09.28.23), #7 (10.30.23), #8 (11.28.23), #9 (12.28.23), #10 (01.29.24), #11 (02.20..24), #12 (03.25.24), #13 (04.22.24) - exam shows scattered MA, DBH OU - FA (10.17.22) shows late leaking MA OU, no NV OU - OCT shows OD: Persistent IRF/cystic changes greatest superior and inferior mac; OS: persistent IRF/ cystic changes temporal fovea and macula  - discussed possibility of elevated BP blunting efficacy of IVE - BCVA OD: 20/20 OU - recommend IVE OD #16 and IVE OS #14 today, 05.20.24 for DME with follow up in 4 weeks - pt wishes to proceed - RBA of procedure discussed, questions answered - IVE informed consent obtained and signed, 03.15.23 (OD) - IVE informed consent obtained and signed, 05.10.23 (OS) - see procedure note - f/u in 4 wks -- DFE/OCT, possible injection(s) vs. Focal laser  3,4. Hypertensive retinopathy OU - discussed importance of tight BP control and possibility of elevated BP blunting efficacy of IVE - monitor  5. Pseudophakia OU  - s/p CE/IOL OU  - IOL in good position, doing well  - monitor  Ophthalmic Meds Ordered this visit:  Meds ordered this encounter  Medications   aflibercept (EYLEA) SOLN 2 mg   aflibercept (EYLEA) SOLN 2 mg     Return in about 4 weeks (around 12/17/2022) for f/u NPDR OU, DFE, OCT.  There are no Patient Instructions on file for this visit.  Explained the diagnoses, plan, and follow up with the patient and they expressed understanding.  Patient expressed understanding of the importance of proper follow up care.   This document serves as a record of services personally performed by Karie Chimera, MD, PhD. It was created on their behalf by Annalee Genta, COMT. The creation of this record is the provider's dictation and/or activities during the visit.  Electronically signed by: Annalee Genta, COMT 11/19/22 12:57 PM  This document serves as a record of services personally performed by Karie Chimera, MD, PhD. It  was created on their behalf by Glee Arvin. Manson Passey, OA an ophthalmic technician. The creation of this record is the provider's dictation and/or activities during the visit.    Electronically signed by: Glee Arvin. Manson Passey, [place] 05.20.2024 12:57 PM  Karie Chimera, M.D., Ph.D. Diseases & Surgery of the Retina and Vitreous [facility]  I have reviewed the above documentation for accuracy and completeness, and I agree with the above. Karie Chimera, M.D., Ph.D. 11/19/22 12:58 PM   Abbreviations: M myopia (nearsighted); A astigmatism; H hyperopia (farsighted); P presbyopia; Mrx spectacle prescription;  CTL contact lenses; OD right eye; OS left eye; OU both eyes  XT exotropia; ET esotropia; PEK punctate epithelial keratitis; PEE punctate epithelial erosions; DES dry eye syndrome; MGD meibomian gland dysfunction; ATs artificial tears; PFAT's preservative free artificial tears; NSC nuclear sclerotic cataract; PSC posterior subcapsular cataract; ERM epi-retinal membrane;  PVD posterior vitreous detachment; RD retinal detachment; DM diabetes mellitus; DR diabetic retinopathy; NPDR non-proliferative diabetic retinopathy; PDR proliferative diabetic retinopathy; CSME clinically significant macular edema; DME diabetic macular edema; dbh dot blot hemorrhages; CWS cotton wool spot; POAG primary open angle glaucoma; C/D cup-to-disc ratio; HVF humphrey visual field; GVF goldmann visual field; OCT optical coherence tomography; IOP intraocular pressure; BRVO Branch retinal vein occlusion; CRVO central retinal vein occlusion; CRAO central retinal artery occlusion; BRAO branch retinal artery occlusion; RT retinal tear; SB scleral buckle; PPV pars plana vitrectomy; VH Vitreous hemorrhage; PRP panretinal laser photocoagulation; IVK intravitreal kenalog; VMT vitreomacular traction; MH Macular hole;  NVD neovascularization of the disc; NVE neovascularization elsewhere; AREDS age related eye disease study; ARMD age  related macular degeneration; POAG primary open angle glaucoma; EBMD epithelial/anterior basement membrane dystrophy; ACIOL anterior chamber intraocular lens; IOL intraocular lens; PCIOL posterior chamber intraocular lens; Phaco/IOL phacoemulsification with intraocular lens placement; PRK photorefractive keratectomy; LASIK laser assisted in situ keratomileusis; HTN hypertension; DM diabetes mellitus; COPD chronic obstructive pulmonary disease

## 2022-11-19 ENCOUNTER — Encounter (INDEPENDENT_AMBULATORY_CARE_PROVIDER_SITE_OTHER): Payer: Self-pay | Admitting: Ophthalmology

## 2022-11-19 ENCOUNTER — Ambulatory Visit (INDEPENDENT_AMBULATORY_CARE_PROVIDER_SITE_OTHER): Payer: 59 | Admitting: Ophthalmology

## 2022-11-19 ENCOUNTER — Ambulatory Visit: Payer: 59 | Attending: Cardiovascular Disease

## 2022-11-19 ENCOUNTER — Ambulatory Visit (INDEPENDENT_AMBULATORY_CARE_PROVIDER_SITE_OTHER): Payer: 59 | Admitting: Podiatry

## 2022-11-19 ENCOUNTER — Ambulatory Visit (INDEPENDENT_AMBULATORY_CARE_PROVIDER_SITE_OTHER): Payer: 59

## 2022-11-19 DIAGNOSIS — M79672 Pain in left foot: Secondary | ICD-10-CM | POA: Diagnosis not present

## 2022-11-19 DIAGNOSIS — Z961 Presence of intraocular lens: Secondary | ICD-10-CM

## 2022-11-19 DIAGNOSIS — Z794 Long term (current) use of insulin: Secondary | ICD-10-CM | POA: Diagnosis not present

## 2022-11-19 DIAGNOSIS — B351 Tinea unguium: Secondary | ICD-10-CM | POA: Diagnosis not present

## 2022-11-19 DIAGNOSIS — H35033 Hypertensive retinopathy, bilateral: Secondary | ICD-10-CM | POA: Diagnosis not present

## 2022-11-19 DIAGNOSIS — M79675 Pain in left toe(s): Secondary | ICD-10-CM

## 2022-11-19 DIAGNOSIS — I480 Paroxysmal atrial fibrillation: Secondary | ICD-10-CM

## 2022-11-19 DIAGNOSIS — E113313 Type 2 diabetes mellitus with moderate nonproliferative diabetic retinopathy with macular edema, bilateral: Secondary | ICD-10-CM | POA: Diagnosis not present

## 2022-11-19 DIAGNOSIS — L97521 Non-pressure chronic ulcer of other part of left foot limited to breakdown of skin: Secondary | ICD-10-CM | POA: Diagnosis not present

## 2022-11-19 DIAGNOSIS — I10 Essential (primary) hypertension: Secondary | ICD-10-CM

## 2022-11-19 LAB — POCT INR: INR: 2.2 (ref 2.0–3.0)

## 2022-11-19 MED ORDER — SILVER SULFADIAZINE 1 % EX CREA: 1.0000 | TOPICAL_CREAM | Freq: Every day | CUTANEOUS | 2 refills | Status: DC

## 2022-11-19 MED ORDER — CASTELLANI PAINT 1.5 % EX LIQD: 1.0000 "application " | Freq: Two times a day (BID) | CUTANEOUS | 1 refills | Status: DC

## 2022-11-19 MED ORDER — AFLIBERCEPT 2MG/0.05ML IZ SOLN FOR KALEIDOSCOPE
2.0000 mg | INTRAVITREAL | Status: AC | PRN
Start: 2022-11-19 — End: 2022-11-19
  Administered 2022-11-19: 2 mg via INTRAVITREAL

## 2022-11-19 NOTE — Progress Notes (Signed)
Chief Complaint  Patient presents with   Diabetes    Patient came in today for Diabetic foot care, A1c-7.9 BG- 186, patient has bilateral callus, patient's left 5th toe is rubbing on the 4th toe and causing pain     HPI: 72 y.o. male PMHx T2DM, PAD, CHF presenting today for evaluation of pain and tenderness between the fourth and fifth digits of the left foot. PSxHx TMA RT foot.  Onset about 2 months ago.  He says that his toes are especially tender in close toed shoes.  Presenting for further treatment evaluation  Past Medical History:  Diagnosis Date   AKI (acute kidney injury) ([name])    With STEMI in 2017   Chronic systolic CHF (congestive heart failure) ([name])    Depression    Diabetes mellitus without complication ([name])    Diabetic retinopathy ([name])    GERD (gastroesophageal reflux disease)    Hx of adenomatous colonic polyps 04/07/2018   Hyperlipidemia    Hypertension    Hypertensive retinopathy    Paroxysmal atrial fibrillation ([name])    Peripheral vascular disease ([name])    Pneumonia    Seizures ([name])    hx of as a child   STEMI (ST elevation myocardial infarction) ([name]) 2017    Past Surgical History:  Procedure Laterality Date   ABDOMINAL AORTOGRAM W/LOWER EXTREMITY N/A 09/19/2016   Procedure: Abdominal Aortogram w/Lower Extremity;  Surgeon: Iran Ouch, MD;  [facility]: [facility] INVASIVE CV LAB;  Service: Cardiovascular;  Laterality: N/A;   AMPUTATION Right 03/23/2016   Procedure: 1st and 2nd Ray Amputation Right Foot;  Surgeon: Nadara Mustard, MD;  Location: [facility] OR;  Service: Orthopedics;  Laterality: Right;   AMPUTATION Right 06/21/2016   Procedure: RIGHT TRANSMETATARSAL AMPUTATION;  Surgeon: Nadara Mustard, MD;  Location: [facility] OR;  Service: Orthopedics;  Laterality: Right;   CARDIAC CATHETERIZATION N/A 03/13/2016   Procedure: Right/Left Heart Cath and Coronary Angiography;  Surgeon: Tonny Bollman, MD;  Location: [facility] INVASIVE CV LAB;  Service: Cardiovascular;  Laterality:  N/A;   CARDIAC CATHETERIZATION N/A 03/13/2016   Procedure: IABP Insertion;  Surgeon: Tonny Bollman, MD;  Location: [facility] INVASIVE CV LAB;  Service: Cardiovascular;  Laterality: N/A;   CATARACT EXTRACTION     CATARACT EXTRACTION, BILATERAL     CERVICAL FUSION  1982, 1992   has had 3 neck surgeries from breaking his neck   CIRCUMCISION N/A 11/07/2021   Procedure: CIRCUMCISION ADULT;  Surgeon: Despina Arias, MD;  Location: [facility] ORS;  Service: Urology;  Laterality: N/A;   CORONARY ARTERY BYPASS GRAFT N/A 03/13/2016   Procedure: CORONARY ARTERY BYPASS GRAFTING (CABG) x 1 (SVG to OM) with EVH from LEFT GREATER SAPHENOUS VEIN;  Surgeon: Kerin Perna, MD;  Location: [facility] OR;  Service: Open Heart Surgery;  Laterality: N/A;   EYE SURGERY     LOWER EXTREMITY ANGIOGRAM  05/02/2016   Procedure: Lower Extremity Angiogram;  Surgeon: Iran Ouch, MD;  Location: [facility];  Service: Cardiovascular;;  Limited left femoral runoff right femoral runoff   PERIPHERAL VASCULAR CATHETERIZATION N/A 05/02/2016   Procedure: Abdominal Aortogram;  Surgeon: Iran Ouch, MD;  [facility]: [facility];  Service: Cardiovascular;  Laterality: N/A;   PERIPHERAL VASCULAR CATHETERIZATION Right 05/02/2016   Procedure: Peripheral Vascular Balloon Angioplasty;  Surgeon: Iran Ouch, MD;  Location: [facility];  Service: Cardiovascular;  Laterality: Right;  SFA   TEE WITHOUT CARDIOVERSION N/A 03/13/2016   Procedure: TRANSESOPHAGEAL ECHOCARDIOGRAM (TEE);  Surgeon: Kerin Perna, MD;  Location: [facility] OR;  Service: Open Heart Surgery;  Laterality: N/A;   VSD REPAIR N/A 03/13/2016   Procedure: VENTRICULAR SEPTAL DEFECT (VSD) REPAIR;  Surgeon: Kerin Perna, MD;  Location: [facility] OR;  Service: Open Heart Surgery;  Laterality: N/A;    Allergies  Allergen Reactions   Morphine And Codeine Shortness Of Breath and Other (See Comments)    UNSPECIFIED REACTION "Pt said it was too much"    Latex Rash     LT  fourth webspace 11/19/2022  Physical Exam: General: The patient is alert and oriented x3 in no acute distress.  Dermatology: Skin is warm, dry and supple bilateral lower extremities.  There are some interdigital maceration between the fourth and fifth digits of the left foot.  Superficial skin breakdown but no open wound.  Please see above noted photo There is also some diffuse superficial peeling of skin to the left foot.  Patient's daughter states that this is because of skin pills that she applies to her father's foot  Vascular: Known history of PVD.  Monitored closely by Dr. Lorine Bears MD 01/29/2022 ABI Findings:  +---------+------------------+-----+----------+--------------------------+  Right   Rt Pressure (mmHg)IndexWaveform  Comment                     +---------+------------------+-----+----------+--------------------------+  Brachial 122                                                          +---------+------------------+-----+----------+--------------------------+  PTA     70                0.56 monophasic                            +---------+------------------+-----+----------+--------------------------+  PERO    118               0.94 monophasic                            +---------+------------------+-----+----------+--------------------------+  DP      115               0.92 monophasic                            +---------+------------------+-----+----------+--------------------------+  Great Toe                                 transmetatarsal amputation  +---------+------------------+-----+----------+--------------------------+   +---------+------------------+-----+----------+-------+  Left    Lt Pressure (mmHg)IndexWaveform  Comment  +---------+------------------+-----+----------+-------+  Brachial 125                                       +---------+------------------+-----+----------+-------+  PTA     112                0.90 monophasic         +---------+------------------+-----+----------+-------+  PERO    113               0.90 monophasic         +---------+------------------+-----+----------+-------+  DP      112               0.90 monophasic         +---------+------------------+-----+----------+-------+  Great Toe55                0.44 Abnormal           +---------+------------------+-----+----------+-------+   +-------+-----------+-----------+------------+------------+  ABI/TBIToday's ABIToday's TBIPrevious ABIPrevious TBI  +-------+-----------+-----------+------------+------------+  Right 0.94       amp.       0.89        amp.          +-------+-----------+-----------+------------+------------+  Left  0.90       0.44       0.85        0.54          +-------+-----------+-----------+------------+------------+  Bilateral ABIs appear essentially unchanged compared to prior study on  01/25/21.  Summary:  Right: Resting right ankle-brachial index indicates mild right lower  extremity arterial disease.  Left: Resting left ankle-brachial index indicates mild left lower  extremity arterial disease. The left toe-brachial index is abnormal.   Neurological: Diminished  Musculoskeletal Exam: Prior transmetatarsal amputation right.  Assessment/Plan of Care: 1. T2DM; uncontrolled 2. TMA RLE 3. Interdigital maceration of skin 4th webspace LT 4.  Pain due to onychomycosis of toenails left foot  -Patient evaluated -Prescription for Castellani paint 1.5% phenol solution.  Apply BID to the fourth webspace -Patient also requesting a prescription for Silvadene cream.  Prescription for Silvadene cream.  Apply as needed -Mechanical debridement of nails 1-5 left was performed today using nail nipper without incident or bleeding -Patient has tried diabetic shoes and insoles in the past without any improvement or relief.  Recommend wide fitting shoes -Recommend returning  to clinic annually       Felecia Shelling, DPM [facility]  Dr. Felecia Shelling, DPM    [address] [place], [place] [address]                Office [phone]  Fax [phone]

## 2022-11-19 NOTE — Patient Instructions (Signed)
continue taking 1.5 tablets daily except for 1 tablet every Monday, Wednesday and Friday.  Recheck INR in 4 weeks per pt availability.  Anticoagulation Clinic [phone]

## 2022-11-23 NOTE — Progress Notes (Signed)
Cardiology Office Note   Date:  11/27/2022   ID:  William [name], DOB [birth year], MRN [id number]  PCP:  [name], MD  Cardiologist:   Lorine Bears, MD   No chief complaint on file.      History of Present Illness: William [name] is a 72 y.o. male who is here today for a follow-up visit regarding peripheral arterial disease, coronary artery disease and paroxysmal atrial fibrillation. He has known history of inferior myocardial infarction complicated by VSD in September, 2017. He is status post one-vessel CABG and VSD repair. He had postoperative atrial fibrillation and has been on anticoagulation. He had amputation of the right great toe while hospitalized for gangrene. The patient has known history of diabetes.  He is known to have peripheral arterial disease. Angiography in November, 2017 showed no significant aortoiliac disease, significant right distal SFA stenosis and one-vessel runoff below the knee via the peroneal artery with reconstitution of the dorsalis pedis distally. I performed successful drug-coated balloon angioplasty of the right SFA. He is s/p right transmetatarsal amputation for osteomyelitis.    He had angiography to the left leg in March 2018 which showed moderate stenosis affecting the left popliteal artery and TP trunk with 1 vessel runoff below the knee via the peroneal artery which was a large dominant vessel and reconstituted the dorsalis pedis and posterior tibial at the level of the ankle. There was brisk flow to the toes. Thus, no revascularization was performed.  Most recent vascular studies in July of 2023 showed an ABI of 0.94 on the right and 0.90 on the left.   Duplex on the right showed moderately elevated velocity in the proximal SFA which was unchanged from before.  He has been doing well with no recent chest pain, shortness of breath or palpitations.  No lower extremity claudication.  He recently developed small ulceration between the fourth and  fifth toe on the left but that seems to be healing.       Past Medical History:  Diagnosis Date   AKI (acute kidney injury) ([name])    With STEMI in 2017   Chronic systolic CHF (congestive heart failure) ([name])    Depression    Diabetes mellitus without complication ([name])    Diabetic retinopathy ([name])    GERD (gastroesophageal reflux disease)    Hx of adenomatous colonic polyps 04/07/2018   Hyperlipidemia    Hypertension    Hypertensive retinopathy    Paroxysmal atrial fibrillation ([name])    Peripheral vascular disease ([name])    Pneumonia    Seizures ([name])    hx of as a child   STEMI (ST elevation myocardial infarction) ([name]) 2017    Past Surgical History:  Procedure Laterality Date   ABDOMINAL AORTOGRAM W/LOWER EXTREMITY N/A 09/19/2016   Procedure: Abdominal Aortogram w/Lower Extremity;  Surgeon: Iran Ouch, MD;  Location: [facility] INVAS[facility] LAB;  Service: Cardiovascular;  Laterality: N/A;   AMPUTATION Right 03/23/2016   Procedure: 1st and 2nd Ray Amputation Right Foot;  Surgeon: Nadara Mustard, MD;  Location: [facility] OR;  Service: Orthopedics;  Laterality: Right;   AMPUTATION Right 06/21/2016   Procedure: RIGHT TRANSMETATARSAL AMPUTATION;  Surgeon: Nadara Mustard, MD;  Location: [facility] OR;  Service: Orthopedics;  Laterality: Right;   CARDIAC CATHETERIZATION N/A 03/13/2016   Procedure: Right/Left Heart Cath and Coronary Angiography;  Surgeon: Tonny Bollman, MD;  Location: [facility] INVASIVE CV LAB;  Service: Cardiovascular;  Laterality: N/A;   CARDIAC CATHETERIZATION N/A 03/13/2016  Procedure: IABP Insertion;  Surgeon: Tonny Bollman, MD;  Location: [facility] INVASIVE CV LAB;  Service: Cardiovascular;  Laterality: N/A;   CATARACT EXTRACTION     CATARACT EXTRACTION, BILATERAL     CERVICAL FUSION  1982, 1992   has had 3 neck surgeries from breaking his neck   CIRCUMCISION N/A 11/07/2021   Procedure: CIRCUMCISION ADULT;  Surgeon: Despina Arias, MD;  Location: [facility] ORS;  Service: Urology;  Laterality:  N/A;   CORONARY ARTERY BYPASS GRAFT N/A 03/13/2016   Procedure: CORONARY ARTERY BYPASS GRAFTING (CABG) x 1 (SVG to OM) with EVH from LEFT GREATER SAPHENOUS VEIN;  Surgeon: Kerin Perna, MD;  Location: [facility] OR;  Service: Open Heart Surgery;  Laterality: N/A;   EYE SURGERY     LOWER EXTREMITY ANGIOGRAM  05/02/2016   Procedure: Lower Extremity Angiogram;  Surgeon: Iran Ouch, MD;  Location: [facility] INVASIVE CV LAB;  Service: Cardiovascular;;  Limited left femoral runoff right femoral runoff   PERIPHERAL VASCULAR CATHETERIZATION N/A 05/02/2016   Procedure: Abdominal Aortogram;  Surgeon: Iran Ouch, MD;  Location: [facility]VAS[facility] LAB;  Service: Cardiovascular;  Laterality: N/A;   PERIPHERAL VASCULAR CATHETERIZATION Right 05/02/2016   Procedure: Peripheral Vascular Balloon Angioplasty;  Surgeon: Iran Ouch, MD;  Location: [facility] INVAS[facility] CV LAB;  Service: Cardiovascular;  Laterality: Right;  SFA   TEE WITHOUT CARDIOVERSION N/A 03/13/2016   Procedure: TRANSESOPHAGEAL ECHOCARDIOGRAM (TEE);  Surgeon: Kerin Perna, MD;  Location: [facility] OR;  Service: Open Heart Surgery;  Laterality: N/A;   VSD REPAIR N/A 03/13/2016   Procedure: VENTRICULAR SEPTAL DEFECT (VSD) REPAIR;  Surgeon: Kerin Perna, MD;  Location: [facility] OR;  Service: Open Heart Surgery;  Laterality: N/A;     Current Outpatient Medications  Medication Sig Dispense Refill   albuterol (PROVENTIL HFA;VENTOLIN HFA) 108 (90 Base) MCG/ACT inhaler Inhale 2 puffs into the lungs every 6 (six) hours as needed for wheezing or shortness of breath. 1 Inhaler 0   aspirin EC 81 MG tablet Take 1 tablet (81 mg total) by mouth daily. 30 tablet 3   carvedilol (COREG) 3.125 MG tablet TAKE 1 TABLET(3.125 MG) BY MOUTH TWICE DAILY WITH A MEAL 180 tablet 1   Castellani Paint 1.5 % LIQD Apply 1 application  topically 2 (two) times daily. 29.57 mL 1   Continuous Blood Gluc Receiver (FREESTYLE LIBRE 2 READER) DEVI 1 Act by Does not apply route daily. 2 each 5    Continuous Blood Gluc Sensor (FREESTYLE LIBRE 2 SENSOR) MISC USE TO CHECK BLOOD SUGAR AND CHANGE EVERY 14 DAYS 2 each 5   dapagliflozin propanediol (FARXIGA) 10 MG TABS tablet TAKE 1 TABLET(10 MG) BY MOUTH DAILY 90 tablet 1   gabapentin (NEURONTIN) 300 MG capsule TAKE 1 CAPSULE(300 MG) BY MOUTH TWICE DAILY 180 capsule 1   Glucagon (GVOKE HYPOPEN 2-PACK) 1 MG/0.2ML SOAJ Inject 1 Act into the skin daily as needed. 2 mL 5   HUMALOG KWIKPEN 100 UNIT/ML KwikPen INJECT 10 UNITS UNDER THE SKIN THREE TIMES DAILY AS DIRECTED BY SLIDING SCALE (Patient taking differently: Inject 10-45 Units into the skin 3 (three) times daily. Pt takes with meals) 27 mL 1   icosapent Ethyl (VASCEPA) 1 g capsule Take 2 capsules (2 g total) by mouth 2 (two) times daily. 360 capsule 1   insulin glargine (LANTUS SOLOSTAR) 100 UNIT/ML Solostar Pen ADMINISTER 55 UNITS UNDER THE SKIN EVERY EVENING 51 mL 0   Insulin Pen Needle 31G X 8 MM MISC 1 each by Does  not apply route 4 (four) times daily. 300 each 2   pantoprazole (PROTONIX) 40 MG tablet TAKE 1 TABLET(40 MG) BY MOUTH TWICE DAILY BEFORE A MEAL 180 tablet 0   pramipexole (MIRAPEX) 0.125 MG tablet TAKE 1 TABLET(0.125 MG) BY MOUTH AT BEDTIME 90 tablet 1   rosuvastatin (CRESTOR) 40 MG tablet TAKE 1 TABLET BY MOUTH EVERY DAY 90 tablet 1   silver sulfADIAZINE (SILVADENE) 1 % cream Apply 1 Application topically daily. 400 g 2   torsemide (DEMADEX) 20 MG tablet TAKE 1 TABLET(20 MG) BY MOUTH 3 TIMES A WEEK Monday, Wednesday and Friday 38 tablet 1   warfarin (COUMADIN) 5 MG tablet Take 1 to 1 &1/2  by mouth daily as directed by the coumadin clinic 120 tablet 0   No current facility-administered medications for this visit.    Allergies:   Morphine and codeine and Latex    Social History:  The patient  reports that he quit smoking about 7 years ago. His smoking use included cigarettes. He smoked an average of .5 packs per day. He has never used smokeless tobacco. He reports that he does  not drink alcohol and does not use drugs.   Family History:  Not able to obtain due to distress.  ROS:  Please see the history of present illness.   Otherwise, review of systems are positive for none.   All other systems are reviewed and negative.    PHYSICAL EXAM: VS:  BP (!) 142/64 (BP Location: Left Arm, Patient Position: Sitting, Cuff Size: Normal)   Pulse 74   Ht 5\' 5"  (1.651 m)   Wt 188 lb (85.3 kg)   SpO2 95%   BMI 31.28 kg/m  , BMI Body mass index is 31.28 kg/m. GEN: Well nourished, well developed, in no acute distress  HEENT: normal  Neck: no JVD, carotid bruits, or masses Cardiac: RRR; no  rubs, or gallops, . No murmurs Respiratory:  clear to auscultation bilaterally, normal work of breathing GI: soft, nontender, nondistended, + BS MS: no deformity or atrophy  Skin: warm and dry, no rash Neuro:  Strength and sensation are intact Psych: euthymic mood, full affect Mild bilateral leg edema with very diminished dorsalis pedis and posterior tibial on the left.   EKG:  EKG is ordered today. EKG showed normal sinus rhythm with prior inferior infarct and nonspecific IVCD.  Recent Labs: 05/07/2022: ALT 35 11/05/2022: Hemoglobin 14.6; Platelets 173.0; TSH 2.32    Lipid Panel    Component Value Date/Time   CHOL 126 11/05/2022 1141   CHOL 90 (L) 01/05/2019 1123   TRIG 290.0 (H) 11/05/2022 1141   HDL 32.10 (L) 11/05/2022 1141   HDL 34 (L) 01/05/2019 1123   CHOLHDL 4 11/05/2022 1141   VLDL 58.0 (H) 11/05/2022 1141   LDLCALC 43 05/07/2022 1354   LDLCALC 24 01/05/2019 1123   LDLCALC 45 03/20/2017 1144   LDLDIRECT 54.0 11/05/2022 1141      Wt Readings from Last 3 Encounters:  11/27/22 188 lb (85.3 kg)  11/05/22 185 lb (83.9 kg)  05/08/22 180 lb (81.6 kg)          No data to display             ASSESSMENT AND PLAN:  1.  Peripheral arterial disease :  He is status post drug-coated balloon angioplasty of the right SFA in 2017. He has significant tibial  disease bilaterally.  He had recent small ulceration between the left fourth and fifth toes but that  seems to be healing.  His pulses are not palpable on the left due to his known occluded anterior and posterior tibial arteries but he has a large dominant peroneal artery with reconstitution of the distal vessels.  Currently with no claudication.  Continue close observation and repeat vascular studies in July.  2. Chronic systolic heart failure: He appears to be euvolemic.   He is no longer on losartan due to low blood pressure and chronic kidney disease.  Most recent echo in 2018 showed an EF of 45 to 50%.  He appears to be euvolemic on current dose of torsemide 20 mg 3 times a week.  Continue small dose carvedilol and [place].  His renal function has been stable with a creatinine of 1.7.  3. Paroxysmal atrial fibrillation: He is maintaining in sinus rhythm.  He is on long-term anticoagulation with warfarin with no bleeding complications.  His INR has been therapeutic.  4. Coronary artery disease involving native coronary arteries without angina:  status post one-vessel CABG and VSD repair. Continue medical therapy.  5. Diabetes mellitus: Improved from before.  6. Hyperlipidemia: Recent lipid profile showed an LDL of 54.  Continue rosuvastatin.   Disposition:   follow-up with me in 3 months  Signed,  Lorine Bears, MD  11/27/2022 8:23 AM    [facility]

## 2022-11-27 ENCOUNTER — Encounter: Payer: Self-pay | Admitting: Cardiovascular Disease

## 2022-11-27 ENCOUNTER — Ambulatory Visit: Payer: 59 | Attending: Cardiovascular Disease | Admitting: Cardiovascular Disease

## 2022-11-27 VITALS — BP 142/64 | HR 74 | Ht 65.0 in | Wt 188.0 lb

## 2022-11-27 DIAGNOSIS — I480 Paroxysmal atrial fibrillation: Secondary | ICD-10-CM

## 2022-11-27 DIAGNOSIS — I5022 Chronic systolic (congestive) heart failure: Secondary | ICD-10-CM

## 2022-11-27 DIAGNOSIS — I739 Peripheral vascular disease, unspecified: Secondary | ICD-10-CM

## 2022-11-27 DIAGNOSIS — I2510 Atherosclerotic heart disease of native coronary artery without angina pectoris: Secondary | ICD-10-CM

## 2022-11-27 DIAGNOSIS — E785 Hyperlipidemia, unspecified: Secondary | ICD-10-CM | POA: Diagnosis not present

## 2022-11-27 NOTE — Patient Instructions (Signed)
Medication Instructions:  No changes *If you need a refill on your cardiac medications before your next appointment, please call your pharmacy*   Lab Work: None ordered If you have labs (blood work) drawn today and your tests are completely normal, you will receive your results only by: MyChart Message (if you have MyChart) OR A paper copy in the mail If you have any lab test that is abnormal or we need to change your treatment, we will call you to review the results.   Testing/Procedures: Your physician has requested that you have a lower extremity arterial duplex in July. During this test, ultrasound is used to evaluate arterial blood flow in the legs. Allow one hour for this exam. There are no restrictions or special instructions. This will take place at [address] [facility] [address] [facility], [address].  Your physician has requested that you have an ankle brachial index (ABI) in July. During this test an ultrasound and blood pressure cuff are used to evaluate the arteries that supply the arms and legs with blood. Allow thirty minutes for this exam. There are no restrictions or special instructions. This will take place at [address] [facility], [address].      Follow-Up: At [facility], you and your health needs are our priority.  As part of our continuing mission to provide you with exceptional heart care, we have created designated Provider Care Teams.  These Care Teams include your primary Cardiologist (physician) and Advanced Practice Providers (APPs -  Physician Assistants and Nurse Practitioners) who all work together to provide you with the care you need, when you need it.  We recommend signing up for the patient portal called "MyChart".  Sign up information is provided on this After Visit Summary.  MyChart is used to connect with patients for Virtual Visits (Telemedicine).  Patients are able to view lab/test results, encounter notes, upcoming appointments, etc.  Non-urgent messages can be sent  to your provider as well.   To learn more about what you can do with MyChart, go to [email or web address].    Your next appointment:   3 month(s)  Provider:   Dr. Kirke Corin

## 2022-11-30 ENCOUNTER — Other Ambulatory Visit: Payer: Self-pay | Admitting: Podiatry

## 2022-11-30 DIAGNOSIS — M79672 Pain in left foot: Secondary | ICD-10-CM

## 2022-11-30 DIAGNOSIS — B351 Tinea unguium: Secondary | ICD-10-CM

## 2022-11-30 DIAGNOSIS — L97521 Non-pressure chronic ulcer of other part of left foot limited to breakdown of skin: Secondary | ICD-10-CM

## 2022-12-02 ENCOUNTER — Other Ambulatory Visit: Payer: Self-pay | Admitting: Internal Medicine

## 2022-12-02 DIAGNOSIS — K219 Gastro-esophageal reflux disease without esophagitis: Secondary | ICD-10-CM

## 2022-12-12 NOTE — Progress Notes (Signed)
[facility] - Clinic Note  12/17/2022    CHIEF COMPLAINT Patient presents for Retina Follow Up  HISTORY OF PRESENT ILLNESS: William [name] is a 72 y.o. male who presents to the clinic today for:   HPI     Retina Follow Up   Patient presents with  Diabetic Retinopathy.  In both eyes.  Severity is moderate.  Duration of 4 weeks.  Since onset it is stable.  I, the attending physician,  performed the HPI with the patient and updated documentation appropriately.        Comments   Pt here for 4 wk ret follow up for NPDR OU. Pt states VA is the same, no changes.       Last edited by Rennis Chris, MD on 12/17/2022  8:38 AM.     Pt states   Referring physician: [name], MD [address] [place],  [place] [address]  HISTORICAL INFORMATION:  Selected notes from the MEDICAL RECORD NUMBER Referred by Dr. Alben Spittle for eval of DME OU LEE:  Ocular Hx- PMH-    CURRENT MEDICATIONS: No current outpatient medications on file. (Ophthalmic Drugs)   No current facility-administered medications for this visit. (Ophthalmic Drugs)   Current Outpatient Medications (Other)  Medication Sig   albuterol (PROVENTIL HFA;VENTOLIN HFA) 108 (90 Base) MCG/ACT inhaler Inhale 2 puffs into the lungs every 6 (six) hours as needed for wheezing or shortness of breath.   aspirin EC 81 MG tablet Take 1 tablet (81 mg total) by mouth daily.   carvedilol (COREG) 3.125 MG tablet TAKE 1 TABLET(3.125 MG) BY MOUTH TWICE DAILY WITH A MEAL   Castellani Paint 1.5 % LIQD Apply 1 application  topically 2 (two) times daily.   Continuous Blood Gluc Receiver (FREESTYLE LIBRE 2 READER) DEVI 1 Act by Does not apply route daily.   Continuous Blood Gluc Sensor (FREESTYLE LIBRE 2 SENSOR) MISC USE TO CHECK BLOOD SUGAR AND CHANGE EVERY 14 DAYS   dapagliflozin propanediol (FARXIGA) 10 MG TABS tablet TAKE 1 TABLET(10 MG) BY MOUTH DAILY   gabapentin (NEURONTIN) 300 MG capsule TAKE 1 CAPSULE(300 MG) BY MOUTH  TWICE DAILY   Glucagon (GVOKE HYPOPEN 2-PACK) 1 MG/0.2ML SOAJ Inject 1 Act into the skin daily as needed.   HUMALOG KWIKPEN 100 UNIT/ML KwikPen INJECT 10 UNITS UNDER THE SKIN THREE TIMES DAILY AS DIRECTED BY SLIDING SCALE (Patient taking differently: Inject 10-45 Units into the skin 3 (three) times daily. Pt takes with meals)   icosapent Ethyl (VASCEPA) 1 g capsule Take 2 capsules (2 g total) by mouth 2 (two) times daily.   insulin glargine (LANTUS SOLOSTAR) 100 UNIT/ML Solostar Pen ADMINISTER 55 UNITS UNDER THE SKIN EVERY EVENING   Insulin Pen Needle 31G X 8 MM MISC 1 each by Does not apply route 4 (four) times daily.   pantoprazole (PROTONIX) 40 MG tablet TAKE 1 TABLET(40 MG) BY MOUTH TWICE DAILY BEFORE A MEAL   pramipexole (MIRAPEX) 0.125 MG tablet TAKE 1 TABLET(0.125 MG) BY MOUTH AT BEDTIME   rosuvastatin (CRESTOR) 40 MG tablet TAKE 1 TABLET BY MOUTH EVERY DAY   silver sulfADIAZINE (SILVADENE) 1 % cream Apply 1 Application topically daily.   torsemide (DEMADEX) 20 MG tablet TAKE 1 TABLET(20 MG) BY MOUTH 3 TIMES A WEEK Monday, Wednesday and Friday   warfarin (COUMADIN) 5 MG tablet Take 1 to 1 &1/2  by mouth daily as directed by the coumadin clinic   No current facility-administered medications for this visit. (Other)  REVIEW OF SYSTEMS: ROS   Positive for: Gastrointestinal, Genitourinary, Endocrine, Eyes Negative for: Constitutional, Neurological, Skin, Musculoskeletal, HENT, Cardiovascular, Respiratory, Psychiatric, Allergic/Imm, Heme/Lymph Last edited by Thompson Grayer, COT on 12/17/2022  7:38 AM.      ALLERGIES Allergies  Allergen Reactions   Morphine And Codeine Shortness Of Breath and Other (See Comments)    UNSPECIFIED REACTION "Pt said it was too much"    Latex Rash   PAST MEDICAL HISTORY Past Medical History:  Diagnosis Date   AKI (acute kidney injury) ([name])    With STEMI in 2017   Chronic systolic CHF (congestive heart failure) ([name])    Depression    Diabetes  mellitus without complication ([name])    Diabetic retinopathy ([name])    GERD (gastroesophageal reflux disease)    Hx of adenomatous colonic polyps 04/07/2018   Hyperlipidemia    Hypertension    Hypertensive retinopathy    Paroxysmal atrial fibrillation ([name])    Peripheral vascular disease ([name])    Pneumonia    Seizures ([name])    hx of as a child   STEMI (ST elevation myocardial infarction) ([name]) 2017   Past Surgical History:  Procedure Laterality Date   ABDOMINAL AORTOGRAM W/LOWER EXTREMITY N/A 09/19/2016   Procedure: Abdominal Aortogram w/Lower Extremity;  Surgeon: Iran Ouch, MD;  Location: [facility] INVASIVE CV LAB;  Service: Cardiovascular;  Laterality: N/A;   AMPUTATION Right 03/23/2016   Procedure: 1st and 2nd Ray Amputation Right Foot;  Surgeon: Nadara Mustard, MD;  Location: [facility];  Service: Orthopedics;  Laterality: Right;   AMPUTATION Right 06/21/2016   Procedure: RIGHT TRANSMETATARSAL AMPUTATION;  Surgeon: Nadara Mustard, MD;  Location: [facility] OR;  Service: Orthopedics;  Laterality: Right;   CARDIAC CATHETERIZATION N/A 03/13/2016   Procedure: Right/Left Heart Cath and Coronary Angiography;  Surgeon: Tonny Bollman, MD;  Location: [facility] INVASIVE CV LAB;  Service: Cardiovascular;  Laterality: N/A;   CARDIAC CATHETERIZATION N/A 03/13/2016   Procedure: IABP Insertion;  Surgeon: Tonny Bollman, MD;  Location: [facility] INVASIVE CV LAB;  Service: Cardiovascular;  Laterality: N/A;   CATARACT EXTRACTION     CATARACT EXTRACTION, BILATERAL     CERVICAL FUSION  1982, 1992   has had 3 neck surgeries from breaking his neck   CIRCUMCISION N/A 11/07/2021   Procedure: CIRCUMCISION ADULT;  Surgeon: Despina Arias, MD;  Location: [facility] ORS;  Service: Urology;  Laterality: N/A;   CORONARY ARTERY BYPASS GRAFT N/A 03/13/2016   Procedure: CORONARY ARTERY BYPASS GRAFTING (CABG) x 1 (SVG to OM) with EVH from LEFT GREATER SAPHENOUS VEIN;  Surgeon: Kerin Perna, MD;  Location: [facility] OR;  Service: Open Heart Surgery;   Laterality: N/A;   EYE SURGERY     LOWER EXTREMITY ANGIOGRAM  05/02/2016   Procedure: Lower Extremity Angiogram;  Surgeon: Iran Ouch, MD;  Location: [facility] INVASIVE CV LAB;  Service: Cardiovascular;;  Limited left femoral runoff right femoral runoff   PERIPHERAL VASCULAR CATHETERIZATION N/A 05/02/2016   Procedure: Abdominal Aortogram;  Surgeon: Iran Ouch, MD;  Location: [facility] INVASIVE CV LAB;  Service: Cardiovascular;  Laterality: N/A;   PERIPHERAL VASCULAR CATHETERIZATION Right 05/02/2016   Procedure: Peripheral Vascular Balloon Angioplasty;  Surgeon: Iran Ouch, MD;  Location: [facility] INVASIVE CV LAB;  Service: Cardiovascular;  Laterality: Right;  SFA   TEE WITHOUT CARDIOVERSION N/A 03/13/2016   Procedure: TRANSESOPHAGEAL ECHOCARDIOGRAM (TEE);  Surgeon: Kerin Perna, MD;  Location[facility] OR;  Service: Open Heart Surgery;  Laterality: N/A;   VSD REPAIR  N/A 03/13/2016   Procedure: VENTRICULAR SEPTAL DEFECT (VSD) REPAIR;  Surgeon: Kerin Perna, MD;  Location: [facility] OR;  Service: Open Heart Surgery;  Laterality: N/A;   FAMILY HISTORY Family History  Problem Relation Age of Onset   Diabetes Maternal Grandmother    Diabetes Mother    Aneurysm Mother    Peripheral Artery Disease Mother    Coronary artery disease Mother    Peptic Ulcer Father    Retinoblastoma Daughter    Colon cancer Neg Hx    Rectal cancer Neg Hx    SOCIAL HISTORY Social History   Tobacco Use   Smoking status: Former    Packs/day: .5    Types: Cigarettes    Quit date: 11/20/2015    Years since quitting: 7.0   Smokeless tobacco: Never   Tobacco comments:    quit 2018  Vaping Use   Vaping Use: Never used  Substance Use Topics   Alcohol use: No   Drug use: No       OPHTHALMIC EXAM: Base Eye Exam     Visual Acuity (Snellen - Linear)       Right Left   Dist [place] 20/40 20/20 -1   Dist ph [place] 20/25 +2          Tonometry (Tonopen, 7:42 AM)       Right Left   Pressure 11 12         Pupils        Pupils Dark Light Shape React APD   Right PERRL 2 1 Round Brisk None   Left PERRL 2 1 Round Brisk None         Visual Fields (Counting fingers)       Left Right    Full Full         Extraocular Movement       Right Left    Full, Ortho Full, Ortho         Neuro/Psych     Oriented x3: Yes   Mood/Affect: Normal         Dilation     Both eyes: 1.0% Mydriacyl, 2.5% Phenylephrine @ 7:43 AM           Slit Lamp and Fundus Exam     Slit Lamp Exam       Right Left   Lids/Lashes Dermatochalasis - upper lid, mild MGD Dermatochalasis - upper lid, mild MGD   Conjunctiva/Sclera White and quiet White and quiet   Cornea 1+fine PEE, well healed cataract wound, mild tear film debris Trace PEE, mild tear film debris, well healed cataract wound   Anterior Chamber Deep and quiet Deep and quiet   Iris Round and dilated, No NVI Round and dilated, No NVI   Lens PC IOL in good position PC IOL in good position   Anterior Vitreous Vitreous syneresis, vitreous condensations Vitreous syneresis, Posterior vitreous detachment, vitreous condensations         Fundus Exam       Right Left   Disc trace Pallor, Sharp rim mild Pallor, Sharp rim, mild tilt   C/D Ratio 0.3 0.3   Macula Flat, good foveal reflex, scatted Microaneurysms, persistent cystic changes, possible targets for focal laser superiorly Flat, good foveal reflex, scattered MA, prominent dot heme ST to fovea -- possible focal target   Vessels attenuated, mild tortuosity attenuated, Tortuous   Periphery Attached, +MA greatest posteriorly Attached, scattered MA greatest posteriorly           IMAGING  AND PROCEDURES  Imaging and Procedures for 12/17/2022  POCT INR      Component Value Flag Ref Range Units Status   INR 2.3      2.0 - 3.0  Final   POC INR                    OCT, Retina - OU - Both Eyes       Right Eye Quality was good. Central Foveal Thickness: 249. Progression has improved. Findings  include no SRF, abnormal foveal contour, intraretinal hyper-reflective material, intraretinal fluid, vitreomacular adhesion (Persistent IRF/cystic changes greatest superior and inferior mac -- slightly improved).   Left Eye Quality was good. Central Foveal Thickness: 249. Progression has improved. Findings include normal foveal contour, no SRF, intraretinal hyper-reflective material, intraretinal fluid (Persistent IRF/ cystic changes temporal fovea and macula -- slightly improved).   Notes *Images captured and stored on drive  Diagnosis / Impression:  +DME OU OD: Persistent IRF/cystic changes greatest superior and inferior mac -- slightly improved OS: persistent IRF/ cystic changes temporal fovea and macula -- slightly improved  Clinical management:  See below  Abbreviations: NFP - Normal foveal profile. CME - cystoid macular edema. PED - pigment epithelial detachment. IRF - intraretinal fluid. SRF - subretinal fluid. EZ - ellipsoid zone. ERM - epiretinal membrane. ORA - outer retinal atrophy. ORT - outer retinal tubulation. SRHM - subretinal hyper-reflective material. IRHM - intraretinal hyper-reflective material      Intravitreal Injection, Pharmacologic Agent - OD - Right Eye       Time Out 12/17/2022. 8:15 AM. Confirmed correct patient, procedure, site, and patient consented.   Anesthesia Topical anesthesia was used. Anesthetic medications included Lidocaine 2%, Proparacaine 0.5%.   Procedure Preparation included 5% betadine to ocular surface, eyelid speculum. A (32g) needle was used.   Injection: 2 mg aflibercept 2 MG/0.05ML   Route: Intravitreal, Site: Right Eye   NDC: L6038910, Lot: [id number], Expiration date: 11/30/2023, Waste: 0 mL   Post-op Post injection exam found visual acuity of at least counting fingers. The patient tolerated the procedure well. There were no complications. The patient received written and verbal post procedure care education. Post injection  medications were not given.      Intravitreal Injection, Pharmacologic Agent - OS - Left Eye       Time Out 12/17/2022. 8:16 AM. Confirmed correct patient, procedure, site, and patient consented.   Anesthesia Topical anesthesia was used. Anesthetic medications included Lidocaine 2%, Proparacaine 0.5%.   Procedure Preparation included 5% betadine to ocular surface, eyelid speculum. A (32g) needle was used.   Injection: 2 mg aflibercept 2 MG/0.05ML   Route: Intravitreal, Site: Left Eye   NDC: L6038910, Lot: [id number], Expiration date: 08/30/2023, Waste: 0 mL   Post-op Post injection exam found visual acuity of at least counting fingers. The patient tolerated the procedure well. There were no complications. The patient received written and verbal post procedure care education. Post injection medications were not given.            ASSESSMENT/PLAN:    ICD-10-CM   1. Moderate nonproliferative diabetic retinopathy of both eyes with macular edema associated with type 2 diabetes mellitus ([name])  E11.3313 OCT, Retina - OU - Both Eyes    Intravitreal Injection, Pharmacologic Agent - OD - Right Eye    Intravitreal Injection, Pharmacologic Agent - OS - Left Eye    aflibercept (EYLEA) SOLN 2 mg    aflibercept (EYLEA) SOLN 2 mg    2. Current  use of insulin ([name])  Z79.4     3. Essential hypertension  I10     4. Hypertensive retinopathy of both eyes  H35.033     5. Pseudophakia, both eyes  Z96.1        1,2. Moderate non-proliferative diabetic retinopathy, both eyes  - last A1c was 8.0 on 11.06.23  - s/p IVA OD #1 (11.14.22), #2 (12.12.22), #3 (01.09.23) -- IVA resistance - s/p IVA OS #1 (10.17.22), #2 (11.14.22), #3 (12.12.22), #4 (01.09.23), #5 (03.15.23), #6 (04.12.23) -- IVA resistance - s/p IVE OD #1 (03.15.23), #2 (04.12.23), #3 (05.10.23), #4 (06.07.23), #5 (07.05.23), #6 (08.02.23), #7 (08.31.23), #8 (09.28.23), #9 (10.30.23), #10 (11.28.23), #11 (12.28.23), #12  (01.29.24), #13 (02.20.24), #14 (03.25.24), #15 (04.22.24), #16 (05.20.24) - s/p IVE OS #1 (05.10.23), #2 (06.07.23), #3 (07.05.23), #4 (08.02.23), #5 (08.31.23), #6 (09.28.23), #7 (10.30.23), #8 (11.28.23), #9 (12.28.23), #10 (01.29.24), #11 (02.20..24), #12 (03.25.24), #13 (04.22.24), #14 (05.20.24) - exam shows scattered MA, DBH OU - FA (10.17.22) shows late leaking MA OU, no NV OU - OCT shows OD: Persistent IRF/cystic changes greatest superior and inferior mac -- slightly improved; OS: persistent IRF/ cystic changes temporal fovea and macula -- slightly improved at 4 wks - discussed possibility of elevated BP blunting efficacy of IVE - BCVA OD: 20/20 OU - recommend IVE OD #17 and IVE OS #15 today, 06.17.24 for DME with ext follow up to 5 weeks - pt wishes to proceed - RBA of procedure discussed, questions answered - IVE informed consent obtained and signed, 03.15.23 (OD) - IVE informed consent obtained and signed, 05.10.23 (OS) - see procedure note - f/u June 25 or 26 -- DFE/OCT, focal laser OS - follow up 5 weeks, DFE, OCT, possible injections  3,4. Hypertensive retinopathy OU - discussed importance of tight BP control and possibility of elevated BP blunting efficacy of IVE  - monitor  5. Pseudophakia OU  - s/p CE/IOL OU  - IOL in good position, doing well  - monitor  Ophthalmic Meds Ordered this visit:  Meds ordered this encounter  Medications   aflibercept (EYLEA) SOLN 2 mg   aflibercept (EYLEA) SOLN 2 mg     Return in about 5 weeks (around 01/21/2023) for f/u June 25 or 26, NPDR OU, DFE, OCT, focal laser OS.  There are no Patient Instructions on file for this visit.  Explained the diagnoses, plan, and follow up with the patient and they expressed understanding.  Patient expressed understanding of the importance of proper follow up care.   This document serves as a record of services personally performed by Karie Chimera, MD, PhD. It was created on their behalf by Annalee Genta, COMT. The creation of this record is the provider's dictation and/or activities during the visit.  Electronically signed by: Annalee Genta, COMT 12/17/22 11:53 AM  This document serves as a record of services personally performed by Karie Chimera, MD, PhD. It was created on their behalf by Glee Arvin. Manson Passey, OA an ophthalmic technician. The creation of this record is the provider's dictation and/or activities during the visit.    Electronically signed by: Glee Arvin. Manson Passey, [place] 06.17.2024 11:53 AM  Karie Chimera, M.D., Ph.D. Diseases & Surgery of the Retina and Vitreous [facility]  I have reviewed the above documentation for accuracy and completeness, and I agree with the above. Karie Chimera, M.D., Ph.D. 12/17/22 11:54 AM   Abbreviations: M myopia (nearsighted); A astigmatism; H hyperopia (farsighted); P presbyopia;  Mrx spectacle prescription;  CTL contact lenses; OD right eye; OS left eye; OU both eyes  XT exotropia; ET esotropia; PEK punctate epithelial keratitis; PEE punctate epithelial erosions; DES dry eye syndrome; MGD meibomian gland dysfunction; ATs artificial tears; PFAT's preservative free artificial tears; NSC nuclear sclerotic cataract; PSC posterior subcapsular cataract; ERM epi-retinal membrane; PVD posterior vitreous detachment; RD retinal detachment; DM diabetes mellitus; DR diabetic retinopathy; NPDR non-proliferative diabetic retinopathy; PDR proliferative diabetic retinopathy; CSME clinically significant macular edema; DME diabetic macular edema; dbh dot blot hemorrhages; CWS cotton wool spot; POAG primary open angle glaucoma; C/D cup-to-disc ratio; HVF humphrey visual field; GVF goldmann visual field; OCT optical coherence tomography; IOP intraocular pressure; BRVO Branch retinal vein occlusion; CRVO central retinal vein occlusion; CRAO central retinal artery occlusion; BRAO branch retinal artery occlusion; RT retinal tear; SB scleral buckle; PPV  pars plana vitrectomy; VH Vitreous hemorrhage; PRP panretinal laser photocoagulation; IVK intravitreal kenalog; VMT vitreomacular traction; MH Macular hole;  NVD neovascularization of the disc; NVE neovascularization elsewhere; AREDS age related eye disease study; ARMD age related macular degeneration; POAG primary open angle glaucoma; EBMD epithelial/anterior basement membrane dystrophy; ACIOL anterior chamber intraocular lens; IOL intraocular lens; PCIOL posterior chamber intraocular lens; Phaco/IOL phacoemulsification with intraocular lens placement; PRK photorefractive keratectomy; LASIK laser assisted in situ keratomileusis; HTN hypertension; DM diabetes mellitus; COPD chronic obstructive pulmonary disease

## 2022-12-17 ENCOUNTER — Encounter (INDEPENDENT_AMBULATORY_CARE_PROVIDER_SITE_OTHER): Payer: Self-pay | Admitting: Ophthalmology

## 2022-12-17 ENCOUNTER — Ambulatory Visit: Payer: 59 | Attending: Cardiovascular Disease

## 2022-12-17 ENCOUNTER — Ambulatory Visit (INDEPENDENT_AMBULATORY_CARE_PROVIDER_SITE_OTHER): Payer: 59 | Admitting: Ophthalmology

## 2022-12-17 DIAGNOSIS — Z5181 Encounter for therapeutic drug level monitoring: Secondary | ICD-10-CM | POA: Diagnosis not present

## 2022-12-17 DIAGNOSIS — Z794 Long term (current) use of insulin: Secondary | ICD-10-CM | POA: Diagnosis not present

## 2022-12-17 DIAGNOSIS — E113313 Type 2 diabetes mellitus with moderate nonproliferative diabetic retinopathy with macular edema, bilateral: Secondary | ICD-10-CM | POA: Diagnosis not present

## 2022-12-17 DIAGNOSIS — Z961 Presence of intraocular lens: Secondary | ICD-10-CM | POA: Diagnosis not present

## 2022-12-17 DIAGNOSIS — I480 Paroxysmal atrial fibrillation: Secondary | ICD-10-CM | POA: Diagnosis not present

## 2022-12-17 DIAGNOSIS — H35033 Hypertensive retinopathy, bilateral: Secondary | ICD-10-CM | POA: Diagnosis not present

## 2022-12-17 DIAGNOSIS — I10 Essential (primary) hypertension: Secondary | ICD-10-CM | POA: Diagnosis not present

## 2022-12-17 LAB — POCT INR: INR: 2.3 (ref 2.0–3.0)

## 2022-12-17 MED ORDER — AFLIBERCEPT 2MG/0.05ML IZ SOLN FOR KALEIDOSCOPE
2.0000 mg | INTRAVITREAL | Status: AC | PRN
Start: 2022-12-17 — End: 2022-12-17
  Administered 2022-12-17: 2 mg via INTRAVITREAL

## 2022-12-17 NOTE — Patient Instructions (Signed)
continue taking 1.5 tablets daily except for 1 tablet every Monday, Wednesday and Friday.  Recheck INR in 8 weeks per pt availability.  Anticoagulation Clinic [phone]

## 2022-12-19 NOTE — Progress Notes (Signed)
[facility] - Clinic Note  12/25/2022    CHIEF COMPLAINT Patient presents for Retina Follow Up  HISTORY OF PRESENT ILLNESS: William [name] is a 72 y.o. male who presents to the clinic today for:   HPI     Retina Follow Up   Patient presents with  Diabetic Retinopathy.  In both eyes.  This started 1 week ago.  I, the attending physician,  performed the HPI with the patient and updated documentation appropriately.        Comments   Patient here for 1 week retina follow up for focal laser OS. Patient states vision doing ok. No eye pain.       Last edited by Rennis Chris, MD on 12/25/2022 11:12 AM.    Pt here for focal laser OS  Referring physician: [name], MD [address] [place],  [place] [address]  HISTORICAL INFORMATION:  Selected notes from the MEDICAL RECORD NUMBER Referred by Dr. Alben Spittle for eval of DME OU LEE:  Ocular Hx- PMH-    CURRENT MEDICATIONS: No current outpatient medications on file. (Ophthalmic Drugs)   No current facility-administered medications for this visit. (Ophthalmic Drugs)   Current Outpatient Medications (Other)  Medication Sig   albuterol (PROVENTIL HFA;VENTOLIN HFA) 108 (90 Base) MCG/ACT inhaler Inhale 2 puffs into the lungs every 6 (six) hours as needed for wheezing or shortness of breath.   aspirin EC 81 MG tablet Take 1 tablet (81 mg total) by mouth daily.   carvedilol (COREG) 3.125 MG tablet TAKE 1 TABLET(3.125 MG) BY MOUTH TWICE DAILY WITH A MEAL   Castellani Paint 1.5 % LIQD Apply 1 application  topically 2 (two) times daily.   Continuous Blood Gluc Receiver (FREESTYLE LIBRE 2 READER) DEVI 1 Act by Does not apply route daily.   Continuous Blood Gluc Sensor (FREESTYLE LIBRE 2 SENSOR) MISC USE TO CHECK BLOOD SUGAR AND CHANGE EVERY 14 DAYS   dapagliflozin propanediol (FARXIGA) 10 MG TABS tablet TAKE 1 TABLET(10 MG) BY MOUTH DAILY   gabapentin (NEURONTIN) 300 MG capsule TAKE 1 CAPSULE(300 MG) BY MOUTH TWICE  DAILY   Glucagon (GVOKE HYPOPEN 2-PACK) 1 MG/0.2ML SOAJ Inject 1 Act into the skin daily as needed.   HUMALOG KWIKPEN 100 UNIT/ML KwikPen INJECT 10 UNITS UNDER THE SKIN THREE TIMES DAILY AS DIRECTED BY SLIDING SCALE (Patient taking differently: Inject 10-45 Units into the skin 3 (three) times daily. Pt takes with meals)   icosapent Ethyl (VASCEPA) 1 g capsule Take 2 capsules (2 g total) by mouth 2 (two) times daily.   insulin glargine (LANTUS SOLOSTAR) 100 UNIT/ML Solostar Pen ADMINISTER 55 UNITS UNDER THE SKIN EVERY EVENING   Insulin Pen Needle 31G X 8 MM MISC 1 each by Does not apply route 4 (four) times daily.   pantoprazole (PROTONIX) 40 MG tablet TAKE 1 TABLET(40 MG) BY MOUTH TWICE DAILY BEFORE A MEAL   pramipexole (MIRAPEX) 0.125 MG tablet TAKE 1 TABLET(0.125 MG) BY MOUTH AT BEDTIME   rosuvastatin (CRESTOR) 40 MG tablet TAKE 1 TABLET BY MOUTH EVERY DAY   silver sulfADIAZINE (SILVADENE) 1 % cream Apply 1 Application topically daily.   torsemide (DEMADEX) 20 MG tablet TAKE 1 TABLET(20 MG) BY MOUTH 3 TIMES A WEEK Monday, Wednesday and Friday   warfarin (COUMADIN) 5 MG tablet Take 1 to 1 &1/2  by mouth daily as directed by the coumadin clinic   No current facility-administered medications for this visit. (Other)   REVIEW OF SYSTEMS: ROS  Positive for: Gastrointestinal, Genitourinary, Endocrine, Eyes Negative for: Constitutional, Neurological, Skin, Musculoskeletal, HENT, Cardiovascular, Respiratory, Psychiatric, Allergic/Imm, Heme/Lymph Last edited by Laddie Aquas, COA on 12/25/2022  9:55 AM.       ALLERGIES Allergies  Allergen Reactions   Morphine And Codeine Shortness Of Breath and Other (See Comments)    UNSPECIFIED REACTION "Pt said it was too much"    Latex Rash   PAST MEDICAL HISTORY Past Medical History:  Diagnosis Date   AKI (acute kidney injury) ([name])    With STEMI in 2017   Chronic systolic CHF (congestive heart failure) ([name])    Depression    Diabetes  mellitus without complication ([name])    Diabetic retinopathy ([name])    GERD (gastroesophageal reflux disease)    Hx of adenomatous colonic polyps 04/07/2018   Hyperlipidemia    Hypertension    Hypertensive retinopathy    Paroxysmal atrial fibrillation ([name])    Peripheral vascular disease ([name])    Pneumonia    Seizures ([name])    hx of as a child   STEMI (ST elevation myocardial infarction) ([name]) 2017   Past Surgical History:  Procedure Laterality Date   ABDOMINAL AORTOGRAM W/LOWER EXTREMITY N/A 09/19/2016   Procedure: Abdominal Aortogram w/Lower Extremity;  Surgeon: Iran Ouch, MD;  Location: [facility] INVASIVE CV LAB;  Service: Cardiovascular;  Laterality: N/A;   AMPUTATION Right 03/23/2016   Procedure: 1st and 2nd Ray Amputation Right Foot;  Surgeon: Nadara Mustard, MD;  Location: [facility] OR;  Service: Orthopedics;  Laterality: Right;   AMPUTATION Right 06/21/2016   Procedure: RIGHT TRANSMETATARSAL AMPUTATION;  Surgeon: Nadara Mustard, MD;  Location: [facility] OR;  Service: Orthopedics;  Laterality: Right;   CARDIAC CATHETERIZATION N/A 03/13/2016   Procedure: Right/Left Heart Cath and Coronary Angiography;  Surgeon: Tonny Bollman, MD;  Location[facility] INVASIVE CV LAB;  Service: Cardiovascular;  Laterality: N/A;   CARDIAC CATHETERIZATION N/A 03/13/2016   Procedure: IABP Insertion;  Surgeon: Tonny Bollman, MD;  Location: [facility] INVASIVE CV LAB;  Service: Cardiovascular;  Laterality: N/A;   CATARACT EXTRACTION     CATARACT EXTRACTION, BILATERAL     CERVICAL FUSION  1982, 1992   has had 3 neck surgeries from breaking his neck   CIRCUMCISION N/A 11/07/2021   Procedure: CIRCUMCISION ADULT;  Surgeon: Despina Arias, MD;  Location: [facility] ORS;  Service: Urology;  Laterality: N/A;   CORONARY ARTERY BYPASS GRAFT N/A 03/13/2016   Procedure: CORONARY ARTERY BYPASS GRAFTING (CABG) x 1 (SVG to OM) with EVH from LEFT GREATER SAPHENOUS VEIN;  Surgeon: Kerin Perna, MD;  Location: [facility] OR;  Service: Open Heart Surgery;   Laterality: N/A;   EYE SURGERY     LOWER EXTREMITY ANGIOGRAM  05/02/2016   Procedure: Lower Extremity Angiogram;  Surgeon: Iran Ouch, MD;  Location: [facility] INVASIVE CV LAB;  Service: Cardiovascular;;  Limited left femoral runoff right femoral runoff   PERIPHERAL VASCULAR CATHETERIZATION N/A 05/02/2016   Procedure: Abdominal Aortogram;  Surgeon: Iran Ouch, MD;  Location: [facility] INVASIVE CV LAB;  Service: Cardiovascular;  Laterality: N/A;   PERIPHERAL VASCULAR CATHETERIZATION Right 05/02/2016   Procedure: Peripheral Vascular Balloon Angioplasty;  Surgeon: Iran Ouch, MD;  Location: [facility] INVASIVE CV LAB;  Service: Cardiovascular;  Laterality: Right;  SFA   TEE WITHOUT CARDIOVERSION N/A 03/13/2016   Procedure: TRANSESOPHAGEAL ECHOCARDIOGRAM (TEE);  Surgeon: Kerin Perna, MD;  Location: [facility] OR;  Service: Open Heart Surgery;  Laterality: N/A;   VSD REPAIR N/A 03/13/2016   Procedure:  VENTRICULAR SEPTAL DEFECT (VSD) REPAIR;  Surgeon: Kerin Perna, MD;  Location: [facility] OR;  Service: Open Heart Surgery;  Laterality: N/A;   FAMILY HISTORY Family History  Problem Relation Age of Onset   Diabetes Maternal Grandmother    Diabetes Mother    Aneurysm Mother    Peripheral Artery Disease Mother    Coronary artery disease Mother    Peptic Ulcer Father    Retinoblastoma Daughter    Colon cancer Neg Hx    Rectal cancer Neg Hx    SOCIAL HISTORY Social History   Tobacco Use   Smoking status: Former    Packs/day: .5    Types: Cigarettes    Quit date: 11/20/2015    Years since quitting: 7.1   Smokeless tobacco: Never   Tobacco comments:    quit 2018  Vaping Use   Vaping Use: Never used  Substance Use Topics   Alcohol use: No   Drug use: No       OPHTHALMIC EXAM: Base Eye Exam     Visual Acuity (Snellen - Linear)       Right Left   Dist [place] 20/25 20/20 -1   Dist ph [place] 20/20 -2          Tonometry (Tonopen, 9:53 AM)       Right Left   Pressure 11 11         Pupils        Dark Light Shape React APD   Right 2 1 Round Brisk None   Left 2 1 Round Brisk None         Visual Fields (Counting fingers)       Left Right    Full Full         Extraocular Movement       Right Left    Full, Ortho Full, Ortho         Neuro/Psych     Oriented x3: Yes   Mood/Affect: Normal         Dilation     Both eyes: 1.0% Mydriacyl, 2.5% Phenylephrine @ 9:53 AM           Slit Lamp and Fundus Exam     Slit Lamp Exam       Right Left   Lids/Lashes Dermatochalasis - upper lid, mild MGD Dermatochalasis - upper lid, mild MGD   Conjunctiva/Sclera White and quiet White and quiet   Cornea 1+fine PEE, well healed cataract wound, mild tear film debris Trace PEE, mild tear film debris, well healed cataract wound   Anterior Chamber Deep and quiet Deep and quiet   Iris Round and dilated, No NVI Round and dilated, No NVI   Lens PC IOL in good position PC IOL in good position   Anterior Vitreous Vitreous syneresis, vitreous condensations Vitreous syneresis, Posterior vitreous detachment, vitreous condensations         Fundus Exam       Right Left   Disc trace Pallor, Sharp rim mild Pallor, Sharp rim, mild tilt   C/D Ratio 0.3 0.3   Macula Flat, good foveal reflex, scatted Microaneurysms, persistent cystic changes, possible targets for focal laser superiorly Flat, good foveal reflex, scattered MA, prominent dot heme ST to fovea -- possible focal target   Vessels attenuated, mild tortuosity attenuated, Tortuous   Periphery Attached, +MA greatest posteriorly Attached, scattered MA greatest posteriorly           IMAGING AND PROCEDURES  Imaging and Procedures for 12/25/2022  OCT, Retina - OU - Both Eyes       Right Eye Quality was good. Central Foveal Thickness: 245. Progression has been stable. Findings include no SRF, abnormal foveal contour, intraretinal hyper-reflective material, intraretinal fluid, vitreomacular adhesion (Persistent IRF/cystic  changes greatest superior and inferior mac -- slightly improved).   Left Eye Quality was good. Central Foveal Thickness: 249. Progression has been stable. Findings include normal foveal contour, no SRF, intraretinal hyper-reflective material, intraretinal fluid (Persistent IRF/ cystic changes temporal fovea and macula -- slightly improved).   Notes *Images captured and stored on drive  Diagnosis / Impression:  +DME OU OD: Persistent IRF/cystic changes greatest superior and inferior mac -- slightly improved OS: persistent IRF/ cystic changes temporal fovea and macula -- slightly improved  Clinical management:  See below  Abbreviations: NFP - Normal foveal profile. CME - cystoid macular edema. PED - pigment epithelial detachment. IRF - intraretinal fluid. SRF - subretinal fluid. EZ - ellipsoid zone. ERM - epiretinal membrane. ORA - outer retinal atrophy. ORT - outer retinal tubulation. SRHM - subretinal hyper-reflective material. IRHM - intraretinal hyper-reflective material      Focal Laser - OS - Left Eye       LASER PROCEDURE NOTE  Diagnosis:   Diabetic macular edema, left eye   Procedure:  Focal laser photocoagulation using slit lamp laser, left eye   Anesthesia:  Topical  Surgeon: Rennis Chris, MD, PhD   Informed consent obtained, operative eye marked, and time out performed prior to initiation of laser.   Lumenis [id number] Focal/Grid laser Power: 85 mW Duration: 30 msec  Spot size: 100 microns  # spots: 39 spots placed to perifoveal MAs  Complications: None.  Notes:   RTC: as scheduled July 22 -- DFE/OCT, injxns  Patient tolerated the procedure well and received written and verbal post-procedure care information/education.             ASSESSMENT/PLAN:    ICD-10-CM   1. Moderate nonproliferative diabetic retinopathy of both eyes with macular edema associated with type 2 diabetes mellitus ([name])  E11.3313 OCT, Retina - OU - Both Eyes    Focal Laser - OS  - Left Eye    2. Current use of insulin ([name])  Z79.4     3. Essential hypertension  I10     4. Hypertensive retinopathy of both eyes  H35.033     5. Pseudophakia, both eyes  Z96.1      1,2. Moderate non-proliferative diabetic retinopathy, both eyes  - last A1c was 8.0 on 11.06.23  - s/p IVA OD #1 (11.14.22), #2 (12.12.22), #3 (01.09.23) -- IVA resistance - s/p IVA OS #1 (10.17.22), #2 (11.14.22), #3 (12.12.22), #4 (01.09.23), #5 (03.15.23), #6 (04.12.23) -- IVA resistance - s/p IVE OD #1 (03.15.23), #2 (04.12.23), #3 (05.10.23), #4 (06.07.23), #5 (07.05.23), #6 (08.02.23), #7 (08.31.23), #8 (09.28.23), #9 (10.30.23), #10 (11.28.23), #11 (12.28.23), #12 (01.29.24), #13 (02.20.24), #14 (03.25.24), #15 (04.22.24), #16 (05.20.24), #17 (06.17.24) - s/p IVE OS #1 (05.10.23), #2 (06.07.23), #3 (07.05.23), #4 (08.02.23), #5 (08.31.23), #6 (09.28.23), #7 (10.30.23), #8 (11.28.23), #9 (12.28.23), #10 (01.29.24), #11 (02.20..24), #12 (03.25.24), #13 (04.22.24), #14 (05.20.24), #15 (06.17.24) - exam shows scattered MA, DBH OU - FA (10.17.22) shows late leaking MA OU, no NV OU - OCT shows OD: Persistent IRF/cystic changes greatest superior and inferior mac -- slightly improved; OS: persistent IRF/ cystic changes temporal fovea and macula -- slightly improved - discussed possibility of elevated BP blunting efficacy of IVE - BCVA OD: 20/20 OU -  recommend focal laser OS today, 06.25.24 - pt wishes to proceed with laser - RBA of procedure discussed, questions answered - IVE informed consent obtained and signed, 03.15.23 (OD) - IVE informed consent obtained and signed, 05.10.23 (OS) - see procedure note - start PF QID OS x7 days - follow up as scheduled (July 22, 5 wks), DFE, OCT, possible injections  3,4. Hypertensive retinopathy OU - discussed importance of tight BP control and possibility of elevated BP blunting efficacy of IVE  - monitor  5. Pseudophakia OU  - s/p CE/IOL OU  - IOL in good  position, doing well  - monitor  Ophthalmic Meds Ordered this visit:  No orders of the defined types were placed in this encounter.    Return for as scheduled, Dilated Exam, OCT, Possible Injxn.  There are no Patient Instructions on file for this visit.  Explained the diagnoses, plan, and follow up with the patient and they expressed understanding.  Patient expressed understanding of the importance of proper follow up care.   This document serves as a record of services personally performed by Karie Chimera, MD, PhD. It was created on their behalf by Gerilyn Nestle, COT an ophthalmic technician. The creation of this record is the provider's dictation and/or activities during the visit.    Electronically signed by:  Charlette Caffey, COT  12/25/22 11:19 AM   Karie Chimera, M.D., Ph.D. Diseases & Surgery of the Retina and Vitreous [facility]  I have reviewed the above documentation for accuracy and completeness, and I agree with the above. Karie Chimera, M.D., Ph.D. 12/25/22 11:27 AM  Abbreviations: M myopia (nearsighted); A astigmatism; H hyperopia (farsighted); P presbyopia; Mrx spectacle prescription;  CTL contact lenses; OD right eye; OS left eye; OU both eyes  XT exotropia; ET esotropia; PEK punctate epithelial keratitis; PEE punctate epithelial erosions; DES dry eye syndrome; MGD meibomian gland dysfunction; ATs artificial tears; PFAT's preservative free artificial tears; NSC nuclear sclerotic cataract; PSC posterior subcapsular cataract; ERM epi-retinal membrane; PVD posterior vitreous detachment; RD retinal detachment; DM diabetes mellitus; DR diabetic retinopathy; NPDR non-proliferative diabetic retinopathy; PDR proliferative diabetic retinopathy; CSME clinically significant macular edema; DME diabetic macular edema; dbh dot blot hemorrhages; CWS cotton wool spot; POAG primary open angle glaucoma; C/D cup-to-disc ratio; HVF humphrey visual field;  GVF goldmann visual field; OCT optical coherence tomography; IOP intraocular pressure; BRVO Branch retinal vein occlusion; CRVO central retinal vein occlusion; CRAO central retinal artery occlusion; BRAO branch retinal artery occlusion; RT retinal tear; SB scleral buckle; PPV pars plana vitrectomy; VH Vitreous hemorrhage; PRP panretinal laser photocoagulation; IVK intravitreal kenalog; VMT vitreomacular traction; MH Macular hole;  NVD neovascularization of the disc; NVE neovascularization elsewhere; AREDS age related eye disease study; ARMD age related macular degeneration; POAG primary open angle glaucoma; EBMD epithelial/anterior basement membrane dystrophy; ACIOL anterior chamber intraocular lens; IOL intraocular lens; PCIOL posterior chamber intraocular lens; Phaco/IOL phacoemulsification with intraocular lens placement; PRK photorefractive keratectomy; LASIK laser assisted in situ keratomileusis; HTN hypertension; DM diabetes mellitus; COPD chronic obstructive pulmonary disease

## 2022-12-25 ENCOUNTER — Ambulatory Visit (INDEPENDENT_AMBULATORY_CARE_PROVIDER_SITE_OTHER): Payer: 59 | Admitting: Ophthalmology

## 2022-12-25 ENCOUNTER — Encounter (INDEPENDENT_AMBULATORY_CARE_PROVIDER_SITE_OTHER): Payer: Self-pay | Admitting: Ophthalmology

## 2022-12-25 DIAGNOSIS — H35033 Hypertensive retinopathy, bilateral: Secondary | ICD-10-CM | POA: Diagnosis not present

## 2022-12-25 DIAGNOSIS — Z794 Long term (current) use of insulin: Secondary | ICD-10-CM

## 2022-12-25 DIAGNOSIS — I10 Essential (primary) hypertension: Secondary | ICD-10-CM

## 2022-12-25 DIAGNOSIS — E113313 Type 2 diabetes mellitus with moderate nonproliferative diabetic retinopathy with macular edema, bilateral: Secondary | ICD-10-CM | POA: Diagnosis not present

## 2022-12-25 DIAGNOSIS — Z961 Presence of intraocular lens: Secondary | ICD-10-CM

## 2022-12-25 MED ORDER — PREDNISOLONE ACETATE 1 % OP SUSP: 1.0000 [drp] | Freq: Four times a day (QID) | OPHTHALMIC | 0 refills | Status: AC

## 2022-12-30 ENCOUNTER — Other Ambulatory Visit: Payer: Self-pay | Admitting: Internal Medicine

## 2022-12-30 DIAGNOSIS — E119 Type 2 diabetes mellitus without complications: Secondary | ICD-10-CM

## 2023-01-10 ENCOUNTER — Ambulatory Visit (HOSPITAL_COMMUNITY)
Admission: RE | Admit: 2023-01-10 | Discharge: 2023-01-10 | Disposition: A | Discharge status: Home / Self Care | Payer: 59 | Source: Ambulatory Visit | Attending: Internal Medicine | Admitting: Internal Medicine

## 2023-01-10 DIAGNOSIS — I739 Peripheral vascular disease, unspecified: Secondary | ICD-10-CM

## 2023-01-11 LAB — VAS US ABI WITH/WO TBI
Left ABI: 0.81
Right ABI: 0.9

## 2023-01-16 ENCOUNTER — Telehealth: Payer: Self-pay | Admitting: Cardiovascular Disease

## 2023-01-16 NOTE — Telephone Encounter (Signed)
Daughter called to follow-up on patient's test results.  Daughter noted to call be back after 2:30 pm.

## 2023-01-16 NOTE — Telephone Encounter (Signed)
Patient given results of ABI. States Patient went to foot center. She said she saw "Molli Hazard"  States they said it was not too worrisome, but he is to use lotion on his feet.  Gave an ointment, and he is getting new shoes. She states she was not at the appt. Notes in chart from May, which have information.  Patient already has F/U appt.

## 2023-01-17 ENCOUNTER — Telehealth: Payer: Self-pay | Admitting: Cardiovascular Disease

## 2023-01-17 DIAGNOSIS — M86172 Other acute osteomyelitis, left ankle and foot: Secondary | ICD-10-CM | POA: Diagnosis not present

## 2023-01-17 DIAGNOSIS — E1122 Type 2 diabetes mellitus with diabetic chronic kidney disease: Secondary | ICD-10-CM | POA: Diagnosis not present

## 2023-01-17 DIAGNOSIS — M79673 Pain in unspecified foot: Secondary | ICD-10-CM | POA: Diagnosis not present

## 2023-01-17 DIAGNOSIS — N1832 Chronic kidney disease, stage 3b: Secondary | ICD-10-CM | POA: Diagnosis not present

## 2023-01-17 DIAGNOSIS — L089 Local infection of the skin and subcutaneous tissue, unspecified: Secondary | ICD-10-CM | POA: Diagnosis not present

## 2023-01-17 DIAGNOSIS — I480 Paroxysmal atrial fibrillation: Secondary | ICD-10-CM | POA: Diagnosis not present

## 2023-01-17 DIAGNOSIS — Z89431 Acquired absence of right foot: Secondary | ICD-10-CM | POA: Diagnosis not present

## 2023-01-17 DIAGNOSIS — M7989 Other specified soft tissue disorders: Secondary | ICD-10-CM | POA: Diagnosis not present

## 2023-01-17 DIAGNOSIS — L03116 Cellulitis of left lower limb: Secondary | ICD-10-CM | POA: Diagnosis not present

## 2023-01-17 DIAGNOSIS — S91302A Unspecified open wound, left foot, initial encounter: Secondary | ICD-10-CM | POA: Diagnosis not present

## 2023-01-17 DIAGNOSIS — R609 Edema, unspecified: Secondary | ICD-10-CM | POA: Diagnosis not present

## 2023-01-17 DIAGNOSIS — Z7901 Long term (current) use of anticoagulants: Secondary | ICD-10-CM | POA: Diagnosis not present

## 2023-01-17 DIAGNOSIS — M25571 Pain in right ankle and joints of right foot: Secondary | ICD-10-CM | POA: Diagnosis not present

## 2023-01-17 DIAGNOSIS — L03115 Cellulitis of right lower limb: Secondary | ICD-10-CM | POA: Diagnosis not present

## 2023-01-17 DIAGNOSIS — L0390 Cellulitis, unspecified: Secondary | ICD-10-CM | POA: Insufficient documentation

## 2023-01-17 DIAGNOSIS — E1142 Type 2 diabetes mellitus with diabetic polyneuropathy: Secondary | ICD-10-CM | POA: Diagnosis not present

## 2023-01-17 DIAGNOSIS — I739 Peripheral vascular disease, unspecified: Secondary | ICD-10-CM | POA: Diagnosis not present

## 2023-01-17 DIAGNOSIS — Z743 Need for continuous supervision: Secondary | ICD-10-CM | POA: Diagnosis not present

## 2023-01-17 DIAGNOSIS — E1165 Type 2 diabetes mellitus with hyperglycemia: Secondary | ICD-10-CM | POA: Diagnosis not present

## 2023-01-17 DIAGNOSIS — Z87891 Personal history of nicotine dependence: Secondary | ICD-10-CM | POA: Diagnosis not present

## 2023-01-17 DIAGNOSIS — R059 Cough, unspecified: Secondary | ICD-10-CM | POA: Diagnosis not present

## 2023-01-17 LAB — CBC AND DIFFERENTIAL
HCT: 36 — AB (ref 41–53)
Hemoglobin: 12.3 — AB (ref 13.5–17.5)
Platelets: 138 10*3/uL — AB (ref 150–400)
WBC: 5.1

## 2023-01-17 LAB — BASIC METABOLIC PANEL
BUN: 40 — AB (ref 4–21)
CO2: 23 — AB (ref 13–22)
Chloride: 108 (ref 99–108)
Creatinine: 1.7 — AB (ref 0.6–1.3)
Glucose: 183
Potassium: 4.1 mEq/L (ref 3.5–5.1)
Sodium: 139 (ref 137–147)

## 2023-01-17 LAB — COMPREHENSIVE METABOLIC PANEL
Calcium: 8.8 (ref 8.7–10.7)
eGFR: 43

## 2023-01-17 LAB — HEMOGLOBIN A1C: Hemoglobin A1C: 7.7

## 2023-01-17 NOTE — Telephone Encounter (Signed)
Pt c/o swelling: STAT is pt has developed SOB within 24 hours  If swelling, where is the swelling located? Left foot   How much weight have you gained and in what time span? Not sure   Have you gained 3 pounds in a day or 5 pounds in a week? Not sure   Do you have a log of your daily weights (if so, list)? No  Are you currently taking a fluid pill? Yes  Are you currently SOB? No  Have you traveled recently? No

## 2023-01-17 NOTE — Progress Notes (Signed)
[facility] - Clinic Note  01/21/2023    CHIEF COMPLAINT Patient presents for Retina Follow Up  HISTORY OF PRESENT ILLNESS: William [name] is a 72 y.o. male who presents to the clinic today for:   HPI     Retina Follow Up   Patient presents with  Diabetic Retinopathy.  In both eyes.  This started 5 weeks ago.  Duration of 5 weeks.  Since onset it is stable.  I, the attending physician,  performed the HPI with the patient and updated documentation appropriately.        Comments   5 week retina follow up NPDR pt is reporting no vision changes noticed he denies any flashes or floaters pt last reading was 166 this morning       Last edited by Rennis Chris, MD on 01/21/2023  8:22 AM.    Pt states he has no problems after the laser procedure after his last visit  Referring physician: [name], MD [address] [place],  [place] [address]  HISTORICAL INFORMATION:  Selected notes from the MEDICAL RECORD NUMBER Referred by Dr. Alben Spittle for eval of DME OU LEE:  Ocular Hx- PMH-    CURRENT MEDICATIONS: No current outpatient medications on file. (Ophthalmic Drugs)   No current facility-administered medications for this visit. (Ophthalmic Drugs)   Current Outpatient Medications (Other)  Medication Sig   albuterol (PROVENTIL HFA;VENTOLIN HFA) 108 (90 Base) MCG/ACT inhaler Inhale 2 puffs into the lungs every 6 (six) hours as needed for wheezing or shortness of breath.   aspirin EC 81 MG tablet Take 1 tablet (81 mg total) by mouth daily.   carvedilol (COREG) 3.125 MG tablet TAKE 1 TABLET(3.125 MG) BY MOUTH TWICE DAILY WITH A MEAL   Castellani Paint 1.5 % LIQD Apply 1 application  topically 2 (two) times daily.   Continuous Blood Gluc Receiver (FREESTYLE LIBRE 2 READER) DEVI 1 Act by Does not apply route daily.   Continuous Glucose Sensor (FREESTYLE LIBRE 2 SENSOR) MISC APPLY A NEW SENSOR EVERY 14 DAYS   dapagliflozin propanediol (FARXIGA) 10 MG TABS tablet  TAKE 1 TABLET(10 MG) BY MOUTH DAILY   gabapentin (NEURONTIN) 300 MG capsule TAKE 1 CAPSULE(300 MG) BY MOUTH TWICE DAILY   Glucagon (GVOKE HYPOPEN 2-PACK) 1 MG/0.2ML SOAJ Inject 1 Act into the skin daily as needed.   HUMALOG KWIKPEN 100 UNIT/ML KwikPen INJECT 10 UNITS UNDER THE SKIN THREE TIMES DAILY AS DIRECTED BY SLIDING SCALE (Patient taking differently: Inject 10-45 Units into the skin 3 (three) times daily. Pt takes with meals)   icosapent Ethyl (VASCEPA) 1 g capsule Take 2 capsules (2 g total) by mouth 2 (two) times daily.   insulin glargine (LANTUS SOLOSTAR) 100 UNIT/ML Solostar Pen ADMINISTER 55 UNITS UNDER THE SKIN EVERY EVENING   Insulin Pen Needle 31G X 8 MM MISC 1 each by Does not apply route 4 (four) times daily.   pantoprazole (PROTONIX) 40 MG tablet TAKE 1 TABLET(40 MG) BY MOUTH TWICE DAILY BEFORE A MEAL   pramipexole (MIRAPEX) 0.125 MG tablet TAKE 1 TABLET(0.125 MG) BY MOUTH AT BEDTIME   rosuvastatin (CRESTOR) 40 MG tablet TAKE 1 TABLET BY MOUTH EVERY DAY   silver sulfADIAZINE (SILVADENE) 1 % cream Apply 1 Application topically daily.   torsemide (DEMADEX) 20 MG tablet TAKE 1 TABLET(20 MG) BY MOUTH 3 TIMES A WEEK Monday, Wednesday and Friday   warfarin (COUMADIN) 5 MG tablet Take 1 to 1 &1/2  by mouth daily as directed  by the coumadin clinic   No current facility-administered medications for this visit. (Other)   REVIEW OF SYSTEMS: ROS   Positive for: Gastrointestinal, Genitourinary, Endocrine, Eyes Negative for: Constitutional, Neurological, Skin, Musculoskeletal, HENT, Cardiovascular, Respiratory, Psychiatric, Allergic/Imm, Heme/Lymph Last edited by Etheleen Mayhew, COT on 01/21/2023  7:40 AM.        ALLERGIES Allergies  Allergen Reactions   Morphine And Codeine Shortness Of Breath and Other (See Comments)    UNSPECIFIED REACTION "Pt said it was too much"    Latex Rash   PAST MEDICAL HISTORY Past Medical History:  Diagnosis Date   AKI (acute kidney  injury) ([name])    With STEMI in 2017   Chronic systolic CHF (congestive heart failure) ([name])    Depression    Diabetes mellitus without complication ([name])    Diabetic retinopathy ([name])    GERD (gastroesophageal reflux disease)    Hx of adenomatous colonic polyps 04/07/2018   Hyperlipidemia    Hypertension    Hypertensive retinopathy    Paroxysmal atrial fibrillation ([name])    Peripheral vascular disease ([name])    Pneumonia    Seizures ([name])    hx of as a child   STEMI (ST elevation myocardial infarction) ([name]) 2017   Past Surgical History:  Procedure Laterality Date   ABDOMINAL AORTOGRAM W/LOWER EXTREMITY N/A 09/19/2016   Procedure: Abdominal Aortogram w/Lower Extremity;  Surgeon: Iran Ouch, MD;  Location: [facility] INVASIVE CV LAB;  Service: Cardiovascular;  Laterality: N/A;   AMPUTATION Right 03/23/2016   Procedure: 1st and 2nd Ray Amputation Right Foot;  Surgeon: Nadara Mustard, MD;  Location: [facility] OR;  Service: Orthopedics;  Laterality: Right;   AMPUTATION Right 06/21/2016   Procedure: RIGHT TRANSMETATARSAL AMPUTATION;  Surgeon: Nadara Mustard, MD;  Location: [facility] OR;  Service: Orthopedics;  Laterality: Right;   CARDIAC CATHETERIZATION N/A 03/13/2016   Procedure: Right/Left Heart Cath and Coronary Angiography;  Surgeon: Tonny Bollman, MD;  Location: [facility] INVASIVE CV LAB;  Service: Cardiovascular;  Laterality: N/A;   CARDIAC CATHETERIZATION N/A 03/13/2016   Procedure: IABP Insertion;  Surgeon: Tonny Bollman, MD;  Location: [facility] INVASIVE CV LAB;  Service: Cardiovascular;  Laterality: N/A;   CATARACT EXTRACTION     CATARACT EXTRACTION, BILATERAL     CERVICAL FUSION  1982, 1992   has had 3 neck surgeries from breaking his neck   CIRCUMCISION N/A 11/07/2021   Procedure: CIRCUMCISION ADULT;  Surgeon: Despina Arias, MD;  Location: [facility] ORS;  Service: Urology;  Laterality: N/A;   CORONARY ARTERY BYPASS GRAFT N/A 03/13/2016   Procedure: CORONARY ARTERY BYPASS GRAFTING (CABG) x 1 (SVG to OM) with  EVH from LEFT GREATER SAPHENOUS VEIN;  Surgeon: Kerin Perna, MD;  Location: [facility] OR;  Service: Open Heart Surgery;  Laterality: N/A;   EYE SURGERY     LOWER EXTREMITY ANGIOGRAM  05/02/2016   Procedure: Lower Extremity Angiogram;  Surgeon: Iran Ouch, MD;  Location: [facility] INVASIVE CV LAB;  Service: Cardiovascular;;  Limited left femoral runoff right femoral runoff   PERIPHERAL VASCULAR CATHETERIZATION N/A 05/02/2016   Procedure: Abdominal Aortogram;  Surgeon: Iran Ouch, MD;  Location: [facility];  Service: Cardiovascular;  Laterality: N/A;   PERIPHERAL VASCULAR CATHETERIZATION Right 05/02/2016   Procedure: Peripheral Vascular Balloon Angioplasty;  Surgeon: Iran Ouch, MD;  Location: [facility];  Service: Cardiovascular;  Laterality: Right;  SFA   TEE WITHOUT CARDIOVERSION N/A 03/13/2016   Procedure: TRANSESOPHAGEAL ECHOCARDIOGRAM (TEE);  Surgeon: Kathlee Nations  Maudie Flakes, MD;  Location: [facility] OR;  Service: Open Heart Surgery;  Laterality: N/A;   VSD REPAIR N/A 03/13/2016   Procedure: VENTRICULAR SEPTAL DEFECT (VSD) REPAIR;  Surgeon: Kerin Perna, MD;  Location: [facility] OR;  Service: Open Heart Surgery;  Laterality: N/A;   FAMILY HISTORY Family History  Problem Relation Age of Onset   Diabetes Maternal Grandmother    Diabetes Mother    Aneurysm Mother    Peripheral Artery Disease Mother    Coronary artery disease Mother    Peptic Ulcer Father    Retinoblastoma Daughter    Colon cancer Neg Hx    Rectal cancer Neg Hx    SOCIAL HISTORY Social History   Tobacco Use   Smoking status: Former    Current packs/day: 0.00    Types: Cigarettes    Quit date: 11/20/2015    Years since quitting: 7.1   Smokeless tobacco: Never   Tobacco comments:    quit 2018  Vaping Use   Vaping status: Never Used  Substance Use Topics   Alcohol use: No   Drug use: No       OPHTHALMIC EXAM: Base Eye Exam     Visual Acuity (Snellen - Linear)       Right Left   Dist [place] 20/25 -1  20/20 -2   Dist ph [place] NI          Tonometry (Tonopen, 7:44 AM)       Right Left   Pressure 12 13         Pupils       Pupils Dark Light Shape React APD   Right PERRL 2 1 Round Brisk None   Left PERRL 2 1 Round Brisk None         Visual Fields       Left Right    Full Full         Extraocular Movement       Right Left    Full, Ortho Full, Ortho         Neuro/Psych     Oriented x3: Yes   Mood/Affect: Normal         Dilation     Both eyes: 2.5% Phenylephrine @ 7:44 AM           Slit Lamp and Fundus Exam     Slit Lamp Exam       Right Left   Lids/Lashes Dermatochalasis - upper lid, mild MGD Dermatochalasis - upper lid, mild MGD   Conjunctiva/Sclera White and quiet White and quiet   Cornea 1+fine PEE, well healed cataract wound, mild tear film debris Trace PEE, mild tear film debris, well healed cataract wound   Anterior Chamber Deep and quiet Deep and quiet   Iris Round and dilated, No NVI Round and dilated, No NVI   Lens PC IOL in good position PC IOL in good position   Anterior Vitreous Vitreous syneresis, vitreous condensations Vitreous syneresis, Posterior vitreous detachment, vitreous condensations         Fundus Exam       Right Left   Disc trace Pallor, Sharp rim mild Pallor, Sharp rim, mild tilt   C/D Ratio 0.3 0.3   Macula Flat, good foveal reflex, scatted Microaneurysms, persistent cystic changes -- slightly increased superiorly, possible targets for focal laser superiorly Flat, good foveal reflex, scattered MA -- improved, good focal laser changes   Vessels attenuated, mild tortuosity attenuated, Tortuous   Periphery Attached, +MA greatest posteriorly Attached,  scattered MA greatest posteriorly           IMAGING AND PROCEDURES  Imaging and Procedures for 01/21/2023  OCT, Retina - OU - Both Eyes       Right Eye Quality was good. Central Foveal Thickness: 242. Progression has worsened. Findings include no SRF, abnormal  foveal contour, intraretinal hyper-reflective material, intraretinal fluid, vitreomacular adhesion (Mild interval increase in IRF/cystic changes greatest superior and inferior mac ).   Left Eye Quality was good. Central Foveal Thickness: 247. Progression has been stable. Findings include normal foveal contour, no SRF, intraretinal hyper-reflective material, intraretinal fluid (Persistent IRF/ cystic changes temporal fovea and macula -- slightly improved).   Notes *Images captured and stored on drive  Diagnosis / Impression:  +DME OU OD: Mild interval increase in IRF/cystic changes greatest superior and inferior mac  OS: persistent IRF/ cystic changes temporal fovea and macula -- slightly improved  Clinical management:  See below  Abbreviations: NFP - Normal foveal profile. CME - cystoid macular edema. PED - pigment epithelial detachment. IRF - intraretinal fluid. SRF - subretinal fluid. EZ - ellipsoid zone. ERM - epiretinal membrane. ORA - outer retinal atrophy. ORT - outer retinal tubulation. SRHM - subretinal hyper-reflective material. IRHM - intraretinal hyper-reflective material      Intravitreal Injection, Pharmacologic Agent - OD - Right Eye       Time Out 01/21/2023. 7:51 AM. Confirmed correct patient, procedure, site, and patient consented.   Anesthesia Topical anesthesia was used. Anesthetic medications included Lidocaine 2%, Proparacaine 0.5%.   Procedure Preparation included 5% betadine to ocular surface, eyelid speculum. A (32g) needle was used.   Injection: 2 mg aflibercept 2 MG/0.05ML   Route: Intravitreal, Site: Right Eye   NDC: L6038910, Lot: [id number], Expiration date: 12/30/2023, Waste: 0 mL   Post-op Post injection exam found visual acuity of at least counting fingers. The patient tolerated the procedure well. There were no complications. The patient received written and verbal post procedure care education. Post injection medications were not given.       Intravitreal Injection, Pharmacologic Agent - OS - Left Eye       Time Out 01/21/2023. 7:52 AM. Confirmed correct patient, procedure, site, and patient consented.   Anesthesia Topical anesthesia was used. Anesthetic medications included Lidocaine 2%, Proparacaine 0.5%.   Procedure Preparation included 5% betadine to ocular surface, eyelid speculum. A (32g) needle was used.   Injection: 2 mg aflibercept 2 MG/0.05ML   Route: Intravitreal, Site: Left Eye   NDC: L6038910, Lot: [id number], Expiration date: 12/30/2023, Waste: 0 mL   Post-op Post injection exam found visual acuity of at least counting fingers. The patient tolerated the procedure well. There were no complications. The patient received written and verbal post procedure care education. Post injection medications were not given.            ASSESSMENT/PLAN:    ICD-10-CM   1. Moderate nonproliferative diabetic retinopathy of both eyes with macular edema associated with type 2 diabetes mellitus ([name])  E11.3313 OCT, Retina - OU - Both Eyes    Intravitreal Injection, Pharmacologic Agent - OD - Right Eye    Intravitreal Injection, Pharmacologic Agent - OS - Left Eye    aflibercept (EYLEA) SOLN 2 mg    aflibercept (EYLEA) SOLN 2 mg    2. Current use of insulin ([name])  Z79.4     3. Essential hypertension  I10     4. Hypertensive retinopathy of both eyes  H35.033     5. Pseudophakia,  both eyes  Z96.1      1,2. Moderate non-proliferative diabetic retinopathy, both eyes  - last A1c was 8.0 on 11.06.23  - s/p IVA OD #1 (11.14.22), #2 (12.12.22), #3 (01.09.23) -- IVA resistance - s/p IVA OS #1 (10.17.22), #2 (11.14.22), #3 (12.12.22), #4 (01.09.23), #5 (03.15.23), #6 (04.12.23) -- IVA resistance - s/p IVE OD #1 (03.15.23), #2 (04.12.23), #3 (05.10.23), #4 (06.07.23), #5 (07.05.23), #6 (08.02.23), #7 (08.31.23), #8 (09.28.23), #9 (10.30.23), #10 (11.28.23), #11 (12.28.23), #12 (01.29.24), #13 (02.20.24), #14 (03.25.24),  #15 (04.22.24), #16 (05.20.24), #17 (06.17.24) - s/p IVE OS #1 (05.10.23), #2 (06.07.23), #3 (07.05.23), #4 (08.02.23), #5 (08.31.23), #6 (09.28.23), #7 (10.30.23), #8 (11.28.23), #9 (12.28.23), #10 (01.29.24), #11 (02.20..24), #12 (03.25.24), #13 (04.22.24), #14 (05.20.24), #15 (06.17.24) - s/p focal laser OS (06.25.24) - exam shows scattered MA, DBH OU - FA (10.17.22) shows late leaking MA OU, no NV OU - OCT shows OD: Mild interval increase in IRF/cystic changes greatest superior and inferior mac; OS: persistent IRF/ cystic changes temporal fovea and macula -- slightly improved at 5 wks - discussed possibility of elevated BP blunting efficacy of IVE - BCVA OD: 20/25, 20/20 OS - recommend IVE OU (OD #18 and OS #16) today, 07.22.24 w/ f/u in 5 wks again - pt wishes to proceed with injections - RBA of procedure discussed, questions answered - IVE informed consent obtained and signed, 03.15.23 (OD) - IVE informed consent obtained and signed, 05.10.23 (OS) - see procedure note - follow up 5 weeks, DFE, OCT, possible injections  3,4. Hypertensive retinopathy OU - discussed importance of tight BP control and possibility of elevated BP blunting efficacy of IVE  - monitor  5. Pseudophakia OU  - s/p CE/IOL OU  - IOL in good position, doing well  - monitor  Ophthalmic Meds Ordered this visit:  Meds ordered this encounter  Medications   aflibercept (EYLEA) SOLN 2 mg   aflibercept (EYLEA) SOLN 2 mg     Return in about 5 weeks (around 02/25/2023) for f/u NPDR OU, DFE, OCT.  There are no Patient Instructions on file for this visit.  Explained the diagnoses, plan, and follow up with the patient and they expressed understanding.  Patient expressed understanding of the importance of proper follow up care.   This document serves as a record of services personally performed by Karie Chimera, MD, PhD. It was created on their behalf by Annalee Genta, COMT. The creation of this record is the  provider's dictation and/or activities during the visit.  Electronically signed by: Annalee Genta, COMT 01/21/23 8:29 AM  This document serves as a record of services personally performed by Karie Chimera, MD, PhD. It was created on their behalf by Glee Arvin. Manson Passey, OA an ophthalmic technician. The creation of this record is the provider's dictation and/or activities during the visit.    Electronically signed by: Glee Arvin. Manson Passey, OA 01/21/23 8:29 AM  Karie Chimera, M.D., Ph.D. Diseases & Surgery of the Retina and Vitreous [facility]  I have reviewed the above documentation for accuracy and completeness, and I agree with the above. Karie Chimera, M.D., Ph.D. 01/21/23 8:30 AM   Abbreviations: M myopia (nearsighted); A astigmatism; H hyperopia (farsighted); P presbyopia; Mrx spectacle prescription;  CTL contact lenses; OD right eye; OS left eye; OU both eyes  XT exotropia; ET esotropia; PEK punctate epithelial keratitis; PEE punctate epithelial erosions; DES dry eye syndrome; MGD meibomian gland dysfunction; ATs artificial tears; PFAT's preservative free  artificial tears; NSC nuclear sclerotic cataract; PSC posterior subcapsular cataract; ERM epi-retinal membrane; PVD posterior vitreous detachment; RD retinal detachment; DM diabetes mellitus; DR diabetic retinopathy; NPDR non-proliferative diabetic retinopathy; PDR proliferative diabetic retinopathy; CSME clinically significant macular edema; DME diabetic macular edema; dbh dot blot hemorrhages; CWS cotton wool spot; POAG primary open angle glaucoma; C/D cup-to-disc ratio; HVF humphrey visual field; GVF goldmann visual field; OCT optical coherence tomography; IOP intraocular pressure; BRVO Branch retinal vein occlusion; CRVO central retinal vein occlusion; CRAO central retinal artery occlusion; BRAO branch retinal artery occlusion; RT retinal tear; SB scleral buckle; PPV pars plana vitrectomy; VH Vitreous hemorrhage; PRP  panretinal laser photocoagulation; IVK intravitreal kenalog; VMT vitreomacular traction; MH Macular hole;  NVD neovascularization of the disc; NVE neovascularization elsewhere; AREDS age related eye disease study; ARMD age related macular degeneration; POAG primary open angle glaucoma; EBMD epithelial/anterior basement membrane dystrophy; ACIOL anterior chamber intraocular lens; IOL intraocular lens; PCIOL posterior chamber intraocular lens; Phaco/IOL phacoemulsification with intraocular lens placement; PRK photorefractive keratectomy; LASIK laser assisted in situ keratomileusis; HTN hypertension; DM diabetes mellitus; COPD chronic obstructive pulmonary disease

## 2023-01-17 NOTE — Telephone Encounter (Signed)
Return call and daughter states patient is going to hospital. Will give [place] a call later today

## 2023-01-18 DIAGNOSIS — L03116 Cellulitis of left lower limb: Secondary | ICD-10-CM | POA: Diagnosis not present

## 2023-01-21 ENCOUNTER — Encounter (INDEPENDENT_AMBULATORY_CARE_PROVIDER_SITE_OTHER): Payer: Self-pay | Admitting: Ophthalmology

## 2023-01-21 ENCOUNTER — Telehealth: Payer: Self-pay | Admitting: *Deleted

## 2023-01-21 ENCOUNTER — Encounter: Payer: Self-pay | Admitting: *Deleted

## 2023-01-21 ENCOUNTER — Ambulatory Visit (INDEPENDENT_AMBULATORY_CARE_PROVIDER_SITE_OTHER): Payer: 59 | Admitting: Ophthalmology

## 2023-01-21 DIAGNOSIS — H35033 Hypertensive retinopathy, bilateral: Secondary | ICD-10-CM

## 2023-01-21 DIAGNOSIS — Z961 Presence of intraocular lens: Secondary | ICD-10-CM

## 2023-01-21 DIAGNOSIS — Z794 Long term (current) use of insulin: Secondary | ICD-10-CM

## 2023-01-21 DIAGNOSIS — E113313 Type 2 diabetes mellitus with moderate nonproliferative diabetic retinopathy with macular edema, bilateral: Secondary | ICD-10-CM | POA: Diagnosis not present

## 2023-01-21 DIAGNOSIS — I10 Essential (primary) hypertension: Secondary | ICD-10-CM | POA: Diagnosis not present

## 2023-01-21 MED ORDER — AFLIBERCEPT 2MG/0.05ML IZ SOLN FOR KALEIDOSCOPE
2.0000 mg | INTRAVITREAL | Status: AC | PRN
Start: 2023-01-21 — End: 2023-01-21
  Administered 2023-01-21: 2 mg via INTRAVITREAL

## 2023-01-21 NOTE — Transitions of Care (Post Inpatient/ED Visit) (Signed)
01/21/2023  Name: William [name] MRN: [id number] DOB: 1951/01/14  Today's TOC FU Call Status: Today's TOC FU Call Status:: Successful TOC FU Call Competed TOC FU Call Complete Date: 01/21/23  Transition Care Management Follow-up Telephone Call Date of Discharge: 01/18/23 Discharge Facility: Other ([facility]) Name of Other ([facility]) Discharge Facility: [facility] Type of Discharge: Inpatient Admission Primary Inpatient Discharge Diagnosis:: cellulitis; possible (L) foot osteomyelitis How have you been since you were released from the hospital?: Better (per daughter/ caregiver: "He is overall better now that he is on the antibiotics; I hope it stays that way.  He is pretty independent and helps take care of my mom; but I oversee everything for both of them") Any questions or concerns?: No  Items Reviewed: Did you receive and understand the discharge instructions provided?: Yes (briefly reviewed with patient who verbalizes good understanding of same - outside hospital AVS) Medications obtained,verified, and reconciled?: Yes (Medications Reviewed) (Full medication reconciliation/ review completed; no concerns or discrepancies identified; confirmed patient obtained/ is taking all newly Rx'd medications as instructed; self-manages medications and denies questions/ concerns around medications today) Any new allergies since your discharge?: No Dietary orders reviewed?: Yes Type of Diet Ordered:: Heart Healthy, low salt, "eats a lot of fish" Do you have support at home?: Yes People in Home: child(ren), adult Name of Support/Comfort Primary Source: Caregiver reports essentially independent in self-care activities; resides with supportive daughter assists as/ if needed/ indicated  Medications Reviewed Today: Medications Reviewed Today     Reviewed by Michaela Corner, RN (Registered Nurse) on 01/21/23 at 1504  Med List Status: <None>   Medication Order Taking? Sig  Documenting Provider Last Dose Status Informant  acetaminophen (TYLENOL) 500 MG tablet [id number] Yes Take 500 mg by mouth every 6 (six) hours as needed for moderate pain. [name], MD Taking Active Family Member           Med Note Marilu Favre Jan 21, 2023  3:04 PM) 01/21/23: Reports during [facility] call using this only for pain  albuterol (PROVENTIL HFA;VENTOLIN HFA) 108 (90 Base) MCG/ACT inhaler [id number] Yes Inhale 2 puffs into the lungs every 6 (six) hours as needed for wheezing or shortness of breath. Massie Maroon, FNP Taking Active Family Member  aspirin EC 81 MG tablet [id number] Yes Take 1 tablet (81 mg total) by mouth daily. Massie Maroon, FNP Taking Active Family Member  carvedilol (COREG) 3.125 MG tablet [id number] Yes TAKE 1 TABLET(3.125 MG) BY MOUTH TWICE DAILY WITH A MEAL [name], MD Taking Active   Castellani Paint 1.5 % LIQD [id number] No Apply 1 application  topically 2 (two) times daily.  Patient not taking: Reported on 01/21/2023   Felecia Shelling, DPM Not Taking Active   cephALEXin (KEFLEX) 500 MG capsule [id number] Yes Take 500 mg by mouth 4 (four) times daily. Sanda Linger, MD Taking Active Family Member           Med Note Marilu Favre Jan 21, 2023  2:54 PM) 01/21/23: Reports during [facility] call was prescribed at time of discharge on 01/18/23-- taking as prescribed  Continuous Blood Gluc Receiver (FREESTYLE LIBRE 2 READER) DEVI [id number] Yes 1 Act by Does not apply route daily. [name], MD Taking Active Family Member  Continuous Glucose Sensor (FREESTYLE LIBRE 2 SENSOR) [place] [id number] Yes APPLY A NEW SENSOR EVERY 14 DAYS [name], MD Taking Active   dapagliflozin propanediol (FARXIGA) 10  MG TABS tablet [id number] Yes TAKE 1 TABLET(10 MG) BY MOUTH DAILY [name], MD Taking Active   gabapentin (NEURONTIN) 300 MG capsule [id number] Yes TAKE 1 CAPSULE(300 MG) BY MOUTH TWICE DAILY [name], MD Taking Active   Glucagon (GVOKE  HYPOPEN 2-PACK) 1 MG/0.2ML SOAJ [id number] Yes Inject 1 Act into the skin daily as needed. [name], MD Taking Active   HUMALOG KWIKPEN 100 UNIT/ML Stephanie Coup [id number] Yes INJECT 10 UNITS UNDER THE SKIN THREE TIMES DAILY AS DIRECTED BY SLIDING SCALE  Patient taking differently: Inject 10-45 Units into the skin 3 (three) times daily. Pt takes with meals   [name], MD Taking Active Family Member  icosapent Ethyl (VASCEPA) 1 g capsule [id number] Yes Take 2 capsules (2 g total) by mouth 2 (two) times daily. [name], MD Taking Active   insulin glargine (LANTUS SOLOSTAR) 100 UNIT/ML Solostar Pen [id number] Yes ADMINISTER 55 UNITS UNDER THE SKIN EVERY Henrine Screws, MD Taking Active   Insulin Pen Needle 31G X 8 MM MISC [id number] Yes 1 each by Does not apply route 4 (four) times daily. [name], MD Taking Active   pantoprazole (PROTONIX) 40 MG tablet [id number] Yes TAKE 1 TABLET(40 MG) BY MOUTH TWICE DAILY BEFORE A MEAL [name], MD Taking Active   pramipexole (MIRAPEX) 0.125 MG tablet [id number] Yes TAKE 1 TABLET(0.125 MG) BY MOUTH AT BEDTIME [name], MD Taking Active   rosuvastatin (CRESTOR) 40 MG tablet [id number] Yes TAKE 1 TABLET BY MOUTH EVERY DAY [name], MD Taking Active   silver sulfADIAZINE (SILVADENE) 1 % cream [id number] Yes Apply 1 Application topically daily. Felecia Shelling, DPM Taking Active   torsemide (DEMADEX) 20 MG tablet [id number] Yes TAKE 1 TABLET(20 MG) BY MOUTH 3 TIMES A WEEK Monday, Wednesday and Friday [name], MD Taking Active   warfarin (COUMADIN) 5 MG tablet [id number] Yes Take 1 to 1 &1/2  by mouth daily as directed by the coumadin clinic [place] Ouch, MD Taking Active            Home Care and Equipment/Supplies: Were Home Health Services Ordered?: No Any new equipment or medical supplies ordered?: No  Functional Questionnaire: Do you need assistance with bathing/showering or dressing?: No Do you need  assistance with meal preparation?: No Do you need assistance with eating?: No Do you have difficulty maintaining continence: No Do you need assistance with getting out of bed/getting out of a chair/moving?: No Do you have difficulty managing or taking your medications?: Yes (daughter/ caregiver manages medications)  Follow up appointments reviewed: PCP Follow-up appointment confirmed?: Yes (care coordination outreach in real-time with scheduling care guide to successfully schedule hospital follow up PCP appointment) Date of PCP follow-up appointment?: 01/28/23 Follow-up Provider: PCP Specialist Hospital Follow-up appointment confirmed?: Yes Date of Specialist follow-up appointment?: 01/22/23 Follow-Up Specialty Provider:: cardiology/ vascular provider Do you need transportation to your follow-up appointment?: No Do you understand care options if your condition(s) worsen?: Yes-patient verbalized understanding  SDOH Interventions Today    Flowsheet Row Most Recent Value  SDOH Interventions   Food Insecurity Interventions Intervention Not Indicated  Transportation Interventions Intervention Not Indicated  [daughter Marcelino Duster provides all transportation]      TOC Interventions Today    Flowsheet Row Most Recent Value  TOC Interventions   TOC Interventions Discussed/Reviewed TOC Interventions Discussed, Arranged PCP follow up within 7 days/Care Guide scheduled, Post op wound/incision care  [caregiver declines need for ongoing/  further care coordination outreach,  provided my direct contact information should questions/ concerns/ needs arise post-TOC call]      Interventions Today    Flowsheet Row Most Recent Value  Chronic Disease   Chronic disease during today's visit Other  [cellulitis with suspicion of osteomyelitis]  General Interventions   General Interventions Discussed/Reviewed General Interventions Discussed, Durable Medical Equipment (DME), Doctor Visits  Doctor Visits  Discussed/Reviewed Doctor Visits Discussed, PCP, Specialist  Durable Medical Equipment (DME) Wheelchair  Wheelchair Standard  PCP/Specialist Visits Compliance with follow-up visit  Education Interventions   Education Provided Provided Education  Provided Verbal Education On Medication, Other  [side effects of antibiotics,  potential benefit of probiotic therapy in setting of long term antibiotics,  process to obtain new/ updated wheelchair Rx]  Nutrition Interventions   Nutrition Discussed/Reviewed Nutrition Discussed  Pharmacy Interventions   Pharmacy Dicussed/Reviewed Pharmacy Topics Discussed  [Full medication review with updating medication list in EHR per patient report]  Safety Interventions   Safety Discussed/Reviewed Safety Discussed      Caryl Pina, RN, BSN, CCRN Alumnus RN CM Care Coordination/ Transition of Care- [facility] Care Management [phone]: direct office

## 2023-01-22 ENCOUNTER — Ambulatory Visit: Payer: 59 | Attending: Cardiovascular Disease | Admitting: Cardiovascular Disease

## 2023-01-22 ENCOUNTER — Ambulatory Visit: Payer: 59

## 2023-01-22 ENCOUNTER — Encounter: Payer: Self-pay | Admitting: Cardiovascular Disease

## 2023-01-22 VITALS — BP 112/54 | HR 68 | Ht 65.0 in | Wt 186.0 lb

## 2023-01-22 DIAGNOSIS — E785 Hyperlipidemia, unspecified: Secondary | ICD-10-CM | POA: Diagnosis not present

## 2023-01-22 DIAGNOSIS — S91302A Unspecified open wound, left foot, initial encounter: Secondary | ICD-10-CM | POA: Diagnosis not present

## 2023-01-22 DIAGNOSIS — I2510 Atherosclerotic heart disease of native coronary artery without angina pectoris: Secondary | ICD-10-CM

## 2023-01-22 DIAGNOSIS — I480 Paroxysmal atrial fibrillation: Secondary | ICD-10-CM | POA: Diagnosis not present

## 2023-01-22 DIAGNOSIS — I5022 Chronic systolic (congestive) heart failure: Secondary | ICD-10-CM | POA: Diagnosis not present

## 2023-01-22 DIAGNOSIS — I739 Peripheral vascular disease, unspecified: Secondary | ICD-10-CM | POA: Diagnosis not present

## 2023-01-22 NOTE — Progress Notes (Signed)
Cardiology Office Note   Date:  01/22/2023   ID:  William [name], DOB 05/04/1951, MRN [id number]  PCP:  William Grandchild, MD  Cardiologist:   William Bears, MD   No chief complaint on file.      History of Present Illness: William [name] is a 72 y.o. male who is here today for a follow-up visit regarding peripheral arterial disease, coronary artery disease and paroxysmal atrial fibrillation. He has known history of inferior myocardial infarction complicated by VSD in September, 2017. He is status post one-vessel CABG and VSD repair. He had postoperative atrial fibrillation and has been on anticoagulation. He had amputation of the right great toe while hospitalized for gangrene. The patient has known history of diabetes.  He is known to have peripheral arterial disease. Angiography in November, 2017 showed no significant aortoiliac disease, significant right distal SFA stenosis and one-vessel runoff below the knee via the peroneal artery with reconstitution of the dorsalis pedis distally. I performed successful drug-coated balloon angioplasty of the right SFA. He is s/p right transmetatarsal amputation for osteomyelitis.    He had angiography to the left leg in March 2018 which showed moderate stenosis affecting the left popliteal artery and TP trunk with 1 vessel runoff below the knee via the peroneal artery which was a large dominant vessel and reconstituted the dorsalis pedis and posterior tibial at the level of the ankle. There was brisk flow to the toes. Thus, no revascularization was performed.  He underwent recent Doppler studies which showed an ABI of 0.81 on the left with toe pressure of 49 mmHg.  He developed a small ulceration between the fifth and fourth toes on the left side and recently had significant swelling in the left foot.  He went to [facility] and was diagnosed with cellulitis.  He was treated with antibiotics and discharged on Keflex.  MRI showed no evidence of osteomyelitis.   He developed a small ulceration at the base of the big toe.  He reports improvement overall since he was started on antibiotics.  No chest pain or shortness of breath.      Past Medical History:  Diagnosis Date   AKI (acute kidney injury) ([name])    With STEMI in 2017   Chronic systolic CHF (congestive heart failure) ([name])    Depression    Diabetes mellitus without complication ([name])    Diabetic retinopathy ([name])    GERD (gastroesophageal reflux disease)    Hx of adenomatous colonic polyps 04/07/2018   Hyperlipidemia    Hypertension    Hypertensive retinopathy    Paroxysmal atrial fibrillation ([name])    Peripheral vascular disease ([name])    Pneumonia    Seizures ([name])    hx of as a child   STEMI (ST elevation myocardial infarction) ([name]) 2017    Past Surgical History:  Procedure Laterality Date   ABDOMINAL AORTOGRAM W/LOWER EXTREMITY N/A 09/19/2016   Procedure: Abdominal Aortogram w/Lower Extremity;  Surgeon: Iran Ouch, MD;  Location: [facility] INVASIVE CV LAB;  Service: Cardiovascular;  Laterality: N/A;   AMPUTATION Right 03/23/2016   Procedure: 1st and 2nd Ray Amputation Right Foot;  Surgeon: Nadara Mustard, MD;  Location: [facility] OR;  Service: Orthopedics;  Laterality: Right;   AMPUTATION Right 06/21/2016   Procedure: RIGHT TRANSMETATARSAL AMPUTATION;  Surgeon: Nadara Mustard, MD;  Location: [facility] OR;  Service: Orthopedics;  Laterality: Right;   CARDIAC CATHETERIZATION N/A 03/13/2016   Procedure: Right/Left Heart Cath and Coronary Angiography;  Surgeon: Tonny Bollman,  MD;  Location: [facility] CV LAB;  Service: Cardiovascular;  Laterality: N/A;   CARDIAC CATHETERIZATION N/A 03/13/2016   Procedure: IABP Insertion;  Surgeon: Tonny Bollman, MD;  Location: [facility] INVASIVE CV LAB;  Service: Cardiovascular;  Laterality: N/A;   CATARACT EXTRACTION     CATARACT EXTRACTION, BILATERAL     CERVICAL FUSION  1982, 1992   has had 3 neck surgeries from breaking his neck   CIRCUMCISION N/A 11/07/2021    Procedure: CIRCUMCISION ADULT;  Surgeon: Despina Arias, MD;  Location: [facility] ORS;  Service: Urology;  Laterality: N/A;   CORONARY ARTERY BYPASS GRAFT N/A 03/13/2016   Procedure: CORONARY ARTERY BYPASS GRAFTING (CABG) x 1 (SVG to OM) with EVH from LEFT GREATER SAPHENOUS VEIN;  Surgeon: Kerin Perna, MD;  Location: [facility] OR;  Service: Open Heart Surgery;  Laterality: N/A;   EYE SURGERY     LOWER EXTREMITY ANGIOGRAM  05/02/2016   Procedure: Lower Extremity Angiogram;  Surgeon: Iran Ouch, MD;  Location: [facility];  Service: Cardiovascular;;  Limited left femoral runoff right femoral runoff   PERIPHERAL VASCULAR CATHETERIZATION N/A 05/02/2016   Procedure: Abdominal Aortogram;  Surgeon: Iran Ouch, MD;  Location: [facility];  Service: Cardiovascular;  Laterality: N/A;   PERIPHERAL VASCULAR CATHETERIZATION Right 05/02/2016   Procedure: Peripheral Vascular Balloon Angioplasty;  Surgeon: Iran Ouch, MD;  Location: [facility];  Service: Cardiovascular;  Laterality: Right;  SFA   TEE WITHOUT CARDIOVERSION N/A 03/13/2016   Procedure: TRANSESOPHAGEAL ECHOCARDIOGRAM (TEE);  Surgeon: Kerin Perna, MD;  Location: [facility] OR;  Service: Open Heart Surgery;  Laterality: N/A;   VSD REPAIR N/A 03/13/2016   Procedure: VENTRICULAR SEPTAL DEFECT (VSD) REPAIR;  Surgeon: Kerin Perna, MD;  Location: [facility] OR;  Service: Open Heart Surgery;  Laterality: N/A;     Current Outpatient Medications  Medication Sig Dispense Refill   acetaminophen (TYLENOL) 500 MG tablet Take 500 mg by mouth every 6 (six) hours as needed for moderate pain.     albuterol (PROVENTIL HFA;VENTOLIN HFA) 108 (90 Base) MCG/ACT inhaler Inhale 2 puffs into the lungs every 6 (six) hours as needed for wheezing or shortness of breath. 1 Inhaler 0   aspirin EC 81 MG tablet Take 1 tablet (81 mg total) by mouth daily. 30 tablet 3   carvedilol (COREG) 3.125 MG tablet TAKE 1 TABLET(3.125 MG) BY MOUTH TWICE DAILY WITH  A MEAL 180 tablet 1   Castellani Paint 1.5 % LIQD Apply 1 application  topically 2 (two) times daily. 29.57 mL 1   cephALEXin (KEFLEX) 500 MG capsule Take 500 mg by mouth 4 (four) times daily.     Continuous Blood Gluc Receiver (FREESTYLE LIBRE 2 READER) DEVI 1 Act by Does not apply route daily. 2 each 5   Continuous Glucose Sensor (FREESTYLE LIBRE 2 SENSOR) MISC APPLY A NEW SENSOR EVERY 14 DAYS 2 each 5   dapagliflozin propanediol (FARXIGA) 10 MG TABS tablet TAKE 1 TABLET(10 MG) BY MOUTH DAILY 90 tablet 1   gabapentin (NEURONTIN) 300 MG capsule TAKE 1 CAPSULE(300 MG) BY MOUTH TWICE DAILY 180 capsule 1   Glucagon (GVOKE HYPOPEN 2-PACK) 1 MG/0.2ML SOAJ Inject 1 Act into the skin daily as needed. 2 mL 5   HUMALOG KWIKPEN 100 UNIT/ML KwikPen INJECT 10 UNITS UNDER THE SKIN THREE TIMES DAILY AS DIRECTED BY SLIDING SCALE (Patient taking differently: Inject 10-45 Units into the skin 3 (three) times daily. Pt takes with meals. Depends on sugar)  27 mL 1   icosapent Ethyl (VASCEPA) 1 g capsule Take 2 capsules (2 g total) by mouth 2 (two) times daily. 360 capsule 1   insulin glargine (LANTUS SOLOSTAR) 100 UNIT/ML Solostar Pen ADMINISTER 55 UNITS UNDER THE SKIN EVERY EVENING 51 mL 0   Insulin Pen Needle 31G X 8 MM MISC 1 each by Does not apply route 4 (four) times daily. 300 each 2   pantoprazole (PROTONIX) 40 MG tablet TAKE 1 TABLET(40 MG) BY MOUTH TWICE DAILY BEFORE A MEAL 180 tablet 0   pramipexole (MIRAPEX) 0.125 MG tablet TAKE 1 TABLET(0.125 MG) BY MOUTH AT BEDTIME 90 tablet 1   rosuvastatin (CRESTOR) 40 MG tablet TAKE 1 TABLET BY MOUTH EVERY DAY 90 tablet 1   silver sulfADIAZINE (SILVADENE) 1 % cream Apply 1 Application topically daily. 400 g 2   torsemide (DEMADEX) 20 MG tablet TAKE 1 TABLET(20 MG) BY MOUTH 3 TIMES A WEEK Monday, Wednesday and Friday 38 tablet 1   warfarin (COUMADIN) 5 MG tablet Take 1 to 1 &1/2  by mouth daily as directed by the coumadin clinic (Patient taking differently: Take 1 to 1  &1/2  by mouth daily as directed by the coumadin clinic Take 7.5 mg by mouth daily (1.5 tablet of 5 mg) on Tues, Thurs, Sat and Sun. Takes 5 mg (one 5 mg tablet) on Mon, Wed and Fri.) 120 tablet 0   warfarin (COUMADIN) 5 MG tablet Take 5 mg by mouth daily.     No current facility-administered medications for this visit.    Allergies:   Morphine and codeine and Latex    Social History:  The patient  reports that he quit smoking about 7 years ago. His smoking use included cigarettes. He has never used smokeless tobacco. He reports that he does not drink alcohol and does not use drugs.   Family History:  Not able to obtain due to distress.  ROS:  Please see the history of present illness.   Otherwise, review of systems are positive for none.   All other systems are reviewed and negative.    PHYSICAL EXAM: VS:  BP (!) 112/54 (BP Location: Left Arm, Patient Position: Sitting, Cuff Size: Normal)   Pulse 68   Ht 5\' 5"  (1.651 m)   Wt 186 lb (84.4 kg)   SpO2 91%   BMI 30.95 kg/m  , BMI Body mass index is 30.95 kg/m. GEN: Well nourished, well developed, in no acute distress  HEENT: normal  Neck: no JVD, carotid bruits, or masses Cardiac: RRR; no  rubs, or gallops, . No murmurs Respiratory:  clear to auscultation bilaterally, normal work of breathing GI: soft, nontender, nondistended, + BS MS: no deformity or atrophy  Skin: warm and dry, no rash Neuro:  Strength and sensation are intact Psych: euthymic mood, full affect Distal pulses are not palpable on the left side.  There is slight swelling.  There is a small ulceration between the fourth and fifth toes and a small ulceration at the base of the left big toe.   EKG:  EKG is ordered today. EKG showed normal sinus rhythm with prior inferior infarct and nonspecific IVCD.  Recent Labs: 05/07/2022: ALT 35 11/05/2022: Hemoglobin 14.6; Platelets 173.0; TSH 2.32    Lipid Panel    Component Value Date/Time   CHOL 126 11/05/2022 1141    CHOL 90 (L) 01/05/2019 1123   TRIG 290.0 (H) 11/05/2022 1141   HDL 32.10 (L) 11/05/2022 1141   HDL 34 (L) 01/05/2019  1123   CHOLHDL 4 11/05/2022 1141   VLDL 58.0 (H) 11/05/2022 1141   LDLCALC 43 05/07/2022 1354   LDLCALC 24 01/05/2019 1123   LDLCALC 45 03/20/2017 1144   LDLDIRECT 54.0 11/05/2022 1141      Wt Readings from Last 3 Encounters:  01/22/23 186 lb (84.4 kg)  11/27/22 188 lb (85.3 kg)  11/05/22 185 lb (83.9 kg)          No data to display             ASSESSMENT AND PLAN:  1.  Peripheral arterial disease :  He is status post drug-coated balloon angioplasty of the right SFA in 2017. He has significant tibial disease bilaterally.  He now presents with ulceration involving the left foot which improved after starting antibiotics recently but have not resolved.  He is known to have significant tibial disease on the left side and possible progression of his popliteal and TP trunk stenosis. I recommend proceeding with abdominal aortogram with lower extremity angiography and possible endovascular intervention.  I discussed the procedure in details as well as risk and benefits.  Continue treatment with antibiotics for now and I am referring him to podiatry. Hold warfarin 4 days before and given underlying chronic kidney disease well-hydrated 4 hours before the procedure.  2. Chronic systolic heart failure: He appears to be euvolemic.   He is no longer on losartan due to low blood pressure and chronic kidney disease.  Most recent echo in 2018 showed an EF of 45 to 50%.  He appears to be euvolemic on current dose of torsemide 20 mg 3 times a week.  Continue small dose carvedilol and [place].  His renal function has been stable with a creatinine of 1.7.  3. Paroxysmal atrial fibrillation: He is maintaining in sinus rhythm.  He is on long-term anticoagulation with warfarin with no bleeding complications.  His INR has been therapeutic.  4. Coronary artery disease involving native  coronary arteries without angina:  status post one-vessel CABG and VSD repair. Continue medical therapy.  5. Diabetes mellitus: Improved from before.  6. Hyperlipidemia: Recent lipid profile showed an LDL of 54.  Continue rosuvastatin.   Disposition:   refer to podiatry for left foot care. Proceed with angiogram in few weeks  Signed,  William Bears, MD  01/22/2023 8:39 AM    [facility]

## 2023-01-22 NOTE — Patient Instructions (Addendum)
Medication Instructions:  No changes *If you need a refill on your cardiac medications before your next appointment, please call your pharmacy*  Testing/Procedures: Your physician has requested that you have a peripheral vascular angiogram. This exam is performed at the hospital. During this exam IV contrast is used to look at arterial blood flow. Please review the information sheet given for details.    Follow-Up: At [facility], you and your health needs are our priority.  As part of our continuing mission to provide you with exceptional heart care, we have created designated Provider Care Teams.  These Care Teams include your primary Cardiologist (physician) and Advanced Practice Providers (APPs -  Physician Assistants and Nurse Practitioners) who all work together to provide you with the care you need, when you need it.  We recommend signing up for the patient portal called "MyChart".  Sign up information is provided on this After Visit Summary.  MyChart is used to connect with patients for Virtual Visits (Telemedicine).  Patients are able to view lab/test results, encounter notes, upcoming appointments, etc.  Non-urgent messages can be sent to your provider as well.   To learn more about what you can do with MyChart, go to [email or web address].    Your next appointment:   1 month after the procedure Call [facility] to make an appointment [phone]  Other Instructions  [facility] DEPT OF [facility] AT [facility] [address] [place] [address] Dept: [phone] Loc: [phone]  Donzell Coller  01/22/2023  You are scheduled for a Peripheral Angiogram on Wednesday, August 14 with Dr. Lorine Bears.  1. Please arrive at the [facility] (Main Entrance A) at [facility]: [address] [place], [place] [address] at 9:30am (This time is 4 hour(s) before your procedure to  ensure your preparation). Free valet parking service is available. You will check in at ADMITTING. The support person will be asked to wait in the waiting room.  It is OK to have someone drop you off and come back when you are ready to be discharged.    Special note: Every effort is made to have your procedure done on time. Please understand that emergencies sometimes delay scheduled procedures.  2. Diet: Do not eat solid foods after midnight.  The patient may have clear liquids until 5am upon the day of the procedure.  3. Labs: You will need to have blood drawn around 02/06/23. You do not need to be fasting. You do not need an appointment for the lab. Once in our office lobby there is a podium where you can sign in and ring the doorbell to alert [place] that you are here. The lab is open from 8:00 am to 4 pm; closed for lunch from 12:45pm-1:45pm.  You may also go to any of these [facility] locations:   [facility] - [address] ([facility]) - [address] [phone] [address]   [place] - [address] - [id number]    4. Medication instructions in preparation for your procedure: Hold all diabetic medication the morning of the procedure  -take half the dose of the Lantus the night before the procedure Hold the Coumadin four days prior to the procedure (hold 8/10-8/14) Hold the Torsemide the morning of the procedure  On the morning of your procedure, take your Aspirin 81 mg  and any morning medicines NOT listed above.  You may use sips of water.  5. Plan to go home the same day, you will only stay overnight if medically necessary. 6. Bring a current list of your medications and current insurance cards. 7. You MUST have a responsible person to drive you home. 8. Someone MUST be with you the first 24 hours after you arrive home or your discharge will be delayed. 9. Please wear clothes that are easy to get on and off  and wear slip-on shoes.  Thank you for allowing [place] to care for you!   -- [facility]

## 2023-01-25 ENCOUNTER — Ambulatory Visit: Payer: 59 | Attending: Cardiovascular Disease

## 2023-01-25 DIAGNOSIS — Z5181 Encounter for therapeutic drug level monitoring: Secondary | ICD-10-CM | POA: Diagnosis not present

## 2023-01-25 DIAGNOSIS — I480 Paroxysmal atrial fibrillation: Secondary | ICD-10-CM | POA: Diagnosis not present

## 2023-01-25 LAB — POCT INR: INR: 2.2 (ref 2.0–3.0)

## 2023-01-25 NOTE — Patient Instructions (Signed)
continue taking 1.5 tablets daily except for 1 tablet every Monday, Wednesday and Friday. Abdominal Aortogram 8/14; HOLDING 4 DAYS PRIOR Recheck INR in 4 weeks per pt availability.  Anticoagulation Clinic [phone]

## 2023-01-28 ENCOUNTER — Encounter: Payer: Self-pay | Admitting: Internal Medicine

## 2023-01-28 ENCOUNTER — Telehealth: Payer: Self-pay | Admitting: Cardiovascular Disease

## 2023-01-28 ENCOUNTER — Ambulatory Visit (INDEPENDENT_AMBULATORY_CARE_PROVIDER_SITE_OTHER): Payer: 59 | Admitting: Internal Medicine

## 2023-01-28 VITALS — BP 124/84 | HR 68 | Temp 97.9°F | Resp 16 | Ht 65.0 in | Wt 186.0 lb

## 2023-01-28 DIAGNOSIS — Z794 Long term (current) use of insulin: Secondary | ICD-10-CM

## 2023-01-28 DIAGNOSIS — B353 Tinea pedis: Secondary | ICD-10-CM | POA: Insufficient documentation

## 2023-01-28 DIAGNOSIS — I10 Essential (primary) hypertension: Secondary | ICD-10-CM

## 2023-01-28 DIAGNOSIS — E119 Type 2 diabetes mellitus without complications: Secondary | ICD-10-CM

## 2023-01-28 DIAGNOSIS — E1152 Type 2 diabetes mellitus with diabetic peripheral angiopathy with gangrene: Secondary | ICD-10-CM | POA: Diagnosis not present

## 2023-01-28 DIAGNOSIS — N1832 Chronic kidney disease, stage 3b: Secondary | ICD-10-CM

## 2023-01-28 DIAGNOSIS — I5022 Chronic systolic (congestive) heart failure: Secondary | ICD-10-CM

## 2023-01-28 MED ORDER — FLUCONAZOLE 150 MG PO TABS
150.0000 mg | ORAL_TABLET | ORAL | 0 refills | Status: AC
Start: 2023-01-28 — End: 2023-02-25

## 2023-01-28 NOTE — Telephone Encounter (Signed)
She should call us a week before the procedure and give [place] an update.  If the foot looks good then, we can cancel the procedure.

## 2023-01-28 NOTE — Telephone Encounter (Signed)
Returned call to pt daughter in regards to his upcoming procedure and podiatry appt. Dr. Kirke Corin wanted pt to see the podiatrist before the procedure. Daughter states his podiatrist appt is on the 19th which is after the procedure. Daughter would like to know what to do.

## 2023-01-28 NOTE — Patient Instructions (Signed)
Athlete's Foot Athlete's foot (tinea pedis) is a fungal infection of the skin on your feet. It often occurs on the skin that is between or underneath the toes. It can also occur on the soles of your feet. The infection can spread from person to person (is contagious). It can also spread when a person's bare feet come in contact with the fungus on shower floors or on items such as shoes. What are the causes? This condition is caused by a fungus that grows in warm, moist places. You can get athlete's foot by sharing shoes, shower stalls, towels, and wet floors with someone who is infected. Not washing your feet or changing your socks often enough can also lead to athlete's foot. What increases the risk? This condition is more likely to develop in: Men. People who have a weak body defense system (immune system). People who have diabetes. People who use public showers, such as at a gym. People who wear heavy-duty shoes, such as industrial or military shoes. Seasons with warm, humid weather. What are the signs or symptoms? Symptoms of this condition include: Itchy areas between your toes or on the soles of your feet. White, flaky, or scaly areas between your toes or on the soles of your feet. Very itchy small blisters between your toes or on the soles of your feet. Small cuts in your skin. These cuts can become infected. Thick or discolored toenails. How is this diagnosed? This condition may be diagnosed with a physical exam and a review of your medical history. Your health care provider may also take a skin or toenail sample to examine under a microscope. How is this treated? This condition is treated with antifungal medicines. These may be applied as powders, ointments, or creams. In severe cases, an oral antifungal medicine may be given. Follow these instructions at home: Medicines Apply or take over-the-counter and prescription medicines only as told by your health care provider. Apply your  antifungal medicine as told by your health care provider. Do not stop using the antifungal even if your condition improves. Foot care Do not scratch your feet. Keep your feet dry: Wear cotton or wool socks. Change your socks every day or if they become wet. Wear shoes that allow air to flow, such as sandals or canvas tennis shoes. Wash and dry your feet, including the area between your toes. Also, wash and dry your feet: Every day or as told by your health care provider. After exercising. General instructions Do not let others use towels, shoes, nail clippers, or other personal items that touch your feet. Protect your feet by wearing sandals in wet areas, such as locker rooms and shared showers. Keep all follow-up visits. This is important. If you have diabetes, keep your blood sugar under control. Contact a health care provider if: You have a fever. You have swelling, soreness, warmth, or redness in your foot. Your feet are not getting better with treatment. Your symptoms get worse. You have new symptoms. You have severe pain. Summary Athlete's foot (tinea pedis) is a fungal infection of the skin on your feet. It often occurs on skin that is between or underneath the toes. This condition is caused by a fungus that grows in warm, moist places. Symptoms include white, flaky, or scaly areas between your toes or on the soles of your feet. This condition is treated with antifungal medicines. Keep your feet clean. Always dry them thoroughly. This information is not intended to replace advice given to you by   your health care provider. Make sure you discuss any questions you have with your health care provider. Document Revised: 10/09/2020 Document Reviewed: 10/09/2020 [employer] Patient Education  2024 [employer]  

## 2023-01-28 NOTE — Progress Notes (Unsigned)
Subjective:  Patient ID: William [name], male    DOB: 09-Feb-1951  Age: 72 y.o. MRN: [id number]  CC: Hypertension   HPI Bryse Bila presents for f/up ----  Discussed the use of AI scribe software for clinical note transcription with the patient, who gave verbal consent to proceed.  History of Present Illness   The patient, with a history of compromised blood flow and a previous heart attack, presented with a swollen foot. The swelling was severe enough to prompt an emergency medical service call, but there was a delay in response. The patient was eventually taken to the [facility], where an MRI was performed. The MRI revealed a slight infection, possibly originating from a crack between the toes. The patient was started on an antibiotic, the name of which started with a 'C.' The patient's foot was reportedly squeezed during the MRI, which resulted in additional discomfort. There was initial concern about potential bone infection and the possibility of amputation was discussed, but the patient was discharged the following day with the conclusion that there was no infection.    Patient suffers from Chronic MSK pain and Type 2 Diabetes which impairs their ability to perform daily activities like toileting, feeding, dressing, grooming, bathing. In the home a cane, walker or crutch will not resolve their issues with performing activities of daily living. An electric wheelchair will allow patient to safely perform daily activities. Patient can safely operate the electric wheel chair in the home. Patient has the mental and physical capacity to follow and operate the electric devices safety instructions. Patient is willing to use in the home as well as outside of the home. Upper and Lower Extremity Strength is 3/5.   Outpatient Medications Prior to Visit  Medication Sig Dispense Refill   acetaminophen (TYLENOL) 500 MG tablet Take 500 mg by mouth every 6 (six) hours as needed for moderate pain.     albuterol  (PROVENTIL HFA;VENTOLIN HFA) 108 (90 Base) MCG/ACT inhaler Inhale 2 puffs into the lungs every 6 (six) hours as needed for wheezing or shortness of breath. 1 Inhaler 0   aspirin EC 81 MG tablet Take 1 tablet (81 mg total) by mouth daily. 30 tablet 3   carvedilol (COREG) 3.125 MG tablet TAKE 1 TABLET(3.125 MG) BY MOUTH TWICE DAILY WITH A MEAL 180 tablet 1   Castellani Paint 1.5 % LIQD Apply 1 application  topically 2 (two) times daily. 29.57 mL 1   cephALEXin (KEFLEX) 500 MG capsule Take 500 mg by mouth 4 (four) times daily.     Continuous Blood Gluc Receiver (FREESTYLE LIBRE 2 READER) DEVI 1 Act by Does not apply route daily. 2 each 5   Continuous Glucose Sensor (FREESTYLE LIBRE 2 SENSOR) MISC APPLY A NEW SENSOR EVERY 14 DAYS 2 each 5   dapagliflozin propanediol (FARXIGA) 10 MG TABS tablet TAKE 1 TABLET(10 MG) BY MOUTH DAILY 90 tablet 1   gabapentin (NEURONTIN) 300 MG capsule TAKE 1 CAPSULE(300 MG) BY MOUTH TWICE DAILY 180 capsule 1   Glucagon (GVOKE HYPOPEN 2-PACK) 1 MG/0.2ML SOAJ Inject 1 Act into the skin daily as needed. 2 mL 5   HUMALOG KWIKPEN 100 UNIT/ML KwikPen INJECT 10 UNITS UNDER THE SKIN THREE TIMES DAILY AS DIRECTED BY SLIDING SCALE (Patient taking differently: Inject 10-45 Units into the skin 3 (three) times daily. Pt takes with meals. Depends on sugar) 27 mL 1   icosapent Ethyl (VASCEPA) 1 g capsule Take 2 capsules (2 g total) by mouth 2 (two) times daily. 360  capsule 1   insulin glargine (LANTUS SOLOSTAR) 100 UNIT/ML Solostar Pen ADMINISTER 55 UNITS UNDER THE SKIN EVERY EVENING 51 mL 0   Insulin Pen Needle 31G X 8 MM MISC 1 each by Does not apply route 4 (four) times daily. 300 each 2   pantoprazole (PROTONIX) 40 MG tablet TAKE 1 TABLET(40 MG) BY MOUTH TWICE DAILY BEFORE A MEAL 180 tablet 0   pramipexole (MIRAPEX) 0.125 MG tablet TAKE 1 TABLET(0.125 MG) BY MOUTH AT BEDTIME 90 tablet 1   rosuvastatin (CRESTOR) 40 MG tablet TAKE 1 TABLET BY MOUTH EVERY DAY 90 tablet 1   silver  sulfADIAZINE (SILVADENE) 1 % cream Apply 1 Application topically daily. 400 g 2   torsemide (DEMADEX) 20 MG tablet TAKE 1 TABLET(20 MG) BY MOUTH 3 TIMES A WEEK Monday, Wednesday and Friday 38 tablet 1   warfarin (COUMADIN) 5 MG tablet Take 1 to 1 &1/2  by mouth daily as directed by the coumadin clinic (Patient taking differently: Take 1 to 1 &1/2  by mouth daily as directed by the coumadin clinic Take 7.5 mg by mouth daily (1.5 tablet of 5 mg) on Tues, Thurs, Sat and Sun. Takes 5 mg (one 5 mg tablet) on Mon, Wed and Fri.) 120 tablet 0   warfarin (COUMADIN) 5 MG tablet Take 5 mg by mouth daily.     No facility-administered medications prior to visit.    ROS Review of Systems  Constitutional: Negative.  Negative for diaphoresis.  HENT: Negative.    Respiratory:  Negative for cough, chest tightness and wheezing.   Cardiovascular:  Negative for chest pain, palpitations and leg swelling.  Gastrointestinal: Negative.  Negative for abdominal pain, constipation, diarrhea, nausea and vomiting.  Genitourinary: Negative.  Negative for difficulty urinating.  Musculoskeletal:  Positive for arthralgias, back pain and gait problem. Negative for myalgias.  Skin:  Positive for rash.  Neurological:  Negative for weakness.  Hematological:  Negative for adenopathy. Does not bruise/bleed easily.  Psychiatric/Behavioral: Negative.      Objective:  BP 124/84 (BP Location: Right Arm, Patient Position: Sitting, Cuff Size: Large)   Pulse 68   Temp 97.9 F (36.6 C) (Oral)   Resp 16   Ht 5\' 5"  (1.651 m)   Wt 186 lb (84.4 kg)   SpO2 90%   BMI 30.95 kg/m   BP Readings from Last 3 Encounters:  01/28/23 124/84  01/22/23 (!) 112/54  11/27/22 (!) 142/64    Wt Readings from Last 3 Encounters:  01/28/23 186 lb (84.4 kg)  01/22/23 186 lb (84.4 kg)  11/27/22 188 lb (85.3 kg)    Physical Exam Vitals reviewed.  Constitutional:      General: He is not in acute distress.    Appearance: He is  ill-appearing. He is not toxic-appearing or diaphoretic.  Eyes:     General: No scleral icterus.    Conjunctiva/sclera: Conjunctivae normal.  Cardiovascular:     Rate and Rhythm: Normal rate and regular rhythm.     Heart sounds: No murmur heard. Pulmonary:     Effort: Pulmonary effort is normal.     Breath sounds: No stridor. No wheezing, rhonchi or rales.  Abdominal:     General: Abdomen is flat.     Palpations: There is no mass.     Tenderness: There is no abdominal tenderness. There is no guarding.     Hernia: No hernia is present.  Musculoskeletal:        General: Normal range of motion.  Cervical back: Neck supple.     Right lower leg: No edema.     Left lower leg: Edema present.  Lymphadenopathy:     Cervical: No cervical adenopathy.  Skin:    Findings: Rash present.  Neurological:     Mental Status: He is alert. Mental status is at baseline.  Psychiatric:        Mood and Affect: Mood normal.        Behavior: Behavior normal.     Lab Results  Component Value Date   WBC 5.1 01/17/2023   HGB 12.3 (A) 01/17/2023   HCT 36 (A) 01/17/2023   PLT 138 (A) 01/17/2023   GLUCOSE 190 (H) 11/06/2021   CHOL 126 11/05/2022   TRIG 290.0 (H) 11/05/2022   HDL 32.10 (L) 11/05/2022   LDLDIRECT 54.0 11/05/2022   LDLCALC 43 05/07/2022   ALT 35 05/07/2022   AST 31 05/07/2022   NA 139 01/17/2023   K 4.1 01/17/2023   CL 108 01/17/2023   CREATININE 1.7 (A) 01/17/2023   BUN 40 (A) 01/17/2023   CO2 23 (A) 01/17/2023   TSH 2.32 11/05/2022   PSA 3.50 05/07/2022   INR 2.2 01/25/2023   HGBA1C 7.7 01/17/2023   MICROALBUR <0.7 05/07/2022    VAS [place] LOWER EXTREMITY ARTERIAL DUPLEX  Result Date: 01/11/2023 LOWER EXTREMITY ARTERIAL DUPLEX STUDY Patient Name:  ADLER LUDOVICO  Date of Exam:   01/10/2023 Medical Rec #: [id number]    Accession #:    [id number] Date of Birth: [birth year]   Patient Gender: M Patient Age:   20 years Exam Location:  [facility] Procedure:      VAS [place] LOWER EXTREMITY  ARTERIAL DUPLEX Referring Phys: [facility] --------------------------------------------------------------------------------  Indications: Peripheral artery disease, and Patient reports symptoms of              nighttime cramping in his legs which wakes him up but otherwise              denies signficant claudication or rest pain symptoms at this time. High Risk Factors: Hypertension, hyperlipidemia, Diabetes, past history of                    smoking, coronary artery disease.  Vascular Interventions: 05/2016-right distal SFA angioplasty. S/P                         transmetatarsal amputation of foot, right. Current ABI:            Rt: 0.90                         Lt: 0.81 Comparison Study: Previous arterial duplex 01/29/22 showed proximal right SFA                   50-74% stenosis Performing Technologist: Olegario Shearer RVT  Examination Guidelines: A complete evaluation includes B-mode imaging, spectral Doppler, color Doppler, and power Doppler as needed of all accessible portions of each vessel. Bilateral testing is considered an integral part of a complete examination. Limited examinations for reoccurring indications may be performed as noted.  +----------+--------+-----+---------------+----------+--------+ RIGHT     PSV cm/sRatioStenosis       Waveform  Comments +----------+--------+-----+---------------+----------+--------+ CFA Prox  189          30-49% stenosistriphasic          +----------+--------+-----+---------------+----------+--------+ DFA       86  biphasic           +----------+--------+-----+---------------+----------+--------+ SFA Prox  302     3.2  50-74% stenosisbiphasic           +----------+--------+-----+---------------+----------+--------+ SFA Mid   168                         biphasic           +----------+--------+-----+---------------+----------+--------+ SFA Distal288     2.1  50-74% stenosisbiphasic            +----------+--------+-----+---------------+----------+--------+ POP Prox  105                         monophasic         +----------+--------+-----+---------------+----------+--------+ POP Distal73                          monophasic         +----------+--------+-----+---------------+----------+--------+ TP Trunk  106                         monophasic         +----------+--------+-----+---------------+----------+--------+ A focal velocity elevation of 302 cm/s was obtained at proximal SFA, distal to ostium with post stenotic turbulence with a VR of 3.2. Findings are characteristic of 50-74% stenosis. A 2nd focal velocity elevation was visualized, measuring 288 cm/s at distal SFA with post stenotic turbulence with a VR of 2.1. Findings are characteristic of 50-74% stenosis.  Summary: Right: 30-49% stenosis noted in the common femoral artery. 50-74% stenosis noted in the proximal superficial femoral artery and the distal superficial femoral artery, s/p angioplasty. Mild progression is noted compared to previous study.  See table(s) above for measurements and observations. See ABI report. Suggest follow up study in 12 months. Electronically signed by Charlton Haws MD on 01/11/2023 at 2:26:56 PM.    Final    VAS [place] ABI WITH/WO TBI  Result Date: 01/11/2023  LOWER EXTREMITY DOPPLER STUDY Patient Name:  Jerimah Vanneste  Date of Exam:   01/10/2023 Medical Rec #: [id number]    Accession #:    [id number] Date of Birth: [birth year]   Patient Gender: M Patient Age:   52 years Exam Location:  [facility] Procedure:      VAS [place] ABI WITH/WO TBI Referring Phys: [facility] --------------------------------------------------------------------------------  Indications: Patient reports symptoms of nighttime cramping in his legs which              wakes him up but otherwise denies signficant claudication or rest              pain symptoms at this time. right transmetatarsal amputation High Risk Factors: Hypertension,  hyperlipidemia, Diabetes, past history of                    smoking, coronary artery disease.  Vascular Interventions: 05/2016-right distal SFA angioplasty. S/P                         transmetatarsal amputation of foot, right. Comparison Study: Previous ABIs performed 01/29/22 were 0.94 on the right and                   left ABI of 0.90 Performing Technologist: Olegario Shearer RVT  Examination Guidelines: A complete evaluation includes at minimum, Doppler waveform signals and systolic blood pressure reading at the level  of bilateral brachial, anterior tibial, and posterior tibial arteries, when vessel segments are accessible. Bilateral testing is considered an integral part of a complete examination. Photoelectric Plethysmograph (PPG) waveforms and toe systolic pressure readings are included as required and additional duplex testing as needed. Limited examinations for reoccurring indications may be performed as noted.  ABI Findings: +---------+------------------+-----+----------+----------+ Right    Rt Pressure (mmHg)IndexWaveform  Comment    +---------+------------------+-----+----------+----------+ Brachial 113                                         +---------+------------------+-----+----------+----------+ PTA      106               0.90 monophasic           +---------+------------------+-----+----------+----------+ PERO     104               0.88 monophasic           +---------+------------------+-----+----------+----------+ DP                              absent               +---------+------------------+-----+----------+----------+ Great Toe                                 amputation +---------+------------------+-----+----------+----------+ +---------+------------------+-----+----------+-------+ Left     Lt Pressure (mmHg)IndexWaveform  Comment +---------+------------------+-----+----------+-------+ Brachial 118                                       +---------+------------------+-----+----------+-------+ PTA      94                0.80 monophasic        +---------+------------------+-----+----------+-------+ PERO     95                0.84 monophasic        +---------+------------------+-----+----------+-------+ Great Toe49                0.42 Abnormal          +---------+------------------+-----+----------+-------+ +-------+-----------+-----------+------------+------------+ ABI/TBIToday's ABIToday's TBIPrevious ABIPrevious TBI +-------+-----------+-----------+------------+------------+ Right  0.90                  0.94                     +-------+-----------+-----------+------------+------------+ Left   0.81       0.42       0.90        0.44         +-------+-----------+-----------+------------+------------+  Bilateral ABIs appear essentially unchanged compared to prior study on 01/29/22.  Summary: Right: Resting right ankle-brachial index indicates mild right lower extremity arterial disease. Left: Resting left ankle-brachial index indicates mild left lower extremity arterial disease. The left toe-brachial index is abnormal. *See table(s) above for measurements and observations. See arterial duplex report.  Suggest follow up study in 12 months. Electronically signed by Charlton Haws MD on 01/11/2023 at 12:44:15 PM.    Final     Assessment & Plan:  Tinea pedis of left foot -     Fluconazole; Take 1 tablet (150 mg total) by mouth once a week  for 28 days.  Dispense: 4 tablet; Refill: 0 -     For home use only DME standard manual wheelchair with seat cushion  Chronic systolic HF (heart failure) ([name]) -     For home use only DME standard manual wheelchair with seat cushion  Essential hypertension -     For home use only DME standard manual wheelchair with seat cushion  Type 2 diabetes mellitus with diabetic peripheral angiopathy and gangrene, with long-term current use of insulin ([name]) -     For home use only DME  standard manual wheelchair with seat cushion  Insulin-requiring or dependent type II diabetes mellitus ([name]) - Blood sugar is well controlled.  Stage 3b chronic kidney disease ([name])     Follow-up: Return in about 3 months (around 04/30/2023).  Sanda Linger, MD

## 2023-01-28 NOTE — Telephone Encounter (Signed)
Patient's daughter is requesting call back to discuss upcoming procedure and podiatry appt Dr. Kirke Corin wanted patient to have before this procedure.

## 2023-01-28 NOTE — Telephone Encounter (Signed)
I do not think there is a way for me to expedite podiatrist appointment given that his foot is not in a critical situation.  He should follow the recommendations of his primary care physician and treat the fungal infection.

## 2023-01-28 NOTE — Telephone Encounter (Signed)
The daughter has been made aware. She feels like the foot looks better. She denies redness and swelling. The patient has been able to walk on it. They are trying to get in sooner with the podiatrist.

## 2023-01-29 NOTE — Telephone Encounter (Signed)
The patient's daughter has been made aware to call next week to let us know if the procedure can be cancelled.

## 2023-02-03 ENCOUNTER — Other Ambulatory Visit: Payer: Self-pay | Admitting: Internal Medicine

## 2023-02-03 DIAGNOSIS — Z794 Long term (current) use of insulin: Secondary | ICD-10-CM

## 2023-02-04 ENCOUNTER — Telehealth: Payer: Self-pay | Admitting: Internal Medicine

## 2023-02-04 NOTE — Telephone Encounter (Signed)
Pt's daughter, Marcelino Duster, dropped off forms to be filled out and they have been placed in the providers box.   Please call Marcelino Duster when forms are ready for pick up.  [phone]

## 2023-02-05 NOTE — Telephone Encounter (Signed)
Forms have been signed by PCP.   Pt's daughter, Marcelino Duster, has been informed that forms are ready for pick up.   Forms located at the front desk.

## 2023-02-05 NOTE — Telephone Encounter (Signed)
Forms have been completed and given to PCP to review and sign.  ?

## 2023-02-06 NOTE — Telephone Encounter (Signed)
Patients daughter, Marcelino Duster, has picked up forms.

## 2023-02-08 NOTE — Telephone Encounter (Signed)
Attempted to contact pt's daughter for and update. Left message to call back

## 2023-02-11 ENCOUNTER — Telehealth: Payer: Self-pay | Admitting: *Deleted

## 2023-02-11 NOTE — Telephone Encounter (Signed)
I spoke with patient's daughter, Marcelino Duster. Marcelino Duster states patient has an appointment with the podiatrist August 19 (on wait list to try to get in sooner) and would like to cancel abdominal aortogram scheduled 02/13/23 until after podiatrist appointment. Marcelino Duster reports pt's foot is about the same, not any worse. Marcelino Duster states she will call Misty Stanley after the podiatrist appointment to let her know what podiatrist said and discuss if rescheduling abdominal aortogram is necessary.  Marcelino Duster aware that I  have cancelled 02/13/23 abdominal aortogram and will forward to Dr Kirke Corin to let him know her decision to cancel.

## 2023-02-11 NOTE — Telephone Encounter (Signed)
See 01/28/23 Telephone Note for details. Attempted to call patient's daughter (DPR), Marcelino Duster, for an update and see if patient is going to proceed with Abdominal Aortogram 02/13/23. Left message for her to call back, pt needs to get pre-procedure BMP/CBC if he is going to proceed.

## 2023-02-12 ENCOUNTER — Ambulatory Visit: Payer: 59 | Admitting: Cardiovascular Disease

## 2023-02-12 ENCOUNTER — Other Ambulatory Visit: Payer: Self-pay | Admitting: Internal Medicine

## 2023-02-12 DIAGNOSIS — I2510 Atherosclerotic heart disease of native coronary artery without angina pectoris: Secondary | ICD-10-CM

## 2023-02-12 DIAGNOSIS — E781 Pure hyperglyceridemia: Secondary | ICD-10-CM

## 2023-02-13 ENCOUNTER — Encounter (HOSPITAL_COMMUNITY): Admission: RE | Payer: Self-pay | Source: Home / Self Care

## 2023-02-13 ENCOUNTER — Ambulatory Visit (HOSPITAL_COMMUNITY): Admission: RE | Admit: 2023-02-13 | Payer: 59 | Source: Home / Self Care | Admitting: Cardiovascular Disease

## 2023-02-13 LAB — HM DIABETES EYE EXAM

## 2023-02-13 SURGERY — ABDOMINAL AORTOGRAM W/LOWER EXTREMITY
Anesthesia: LOCAL

## 2023-02-14 NOTE — Telephone Encounter (Signed)
Please see updated encounter  

## 2023-02-18 ENCOUNTER — Ambulatory Visit: Payer: 59 | Attending: Cardiology

## 2023-02-18 ENCOUNTER — Ambulatory Visit (INDEPENDENT_AMBULATORY_CARE_PROVIDER_SITE_OTHER): Payer: 59 | Admitting: Podiatry

## 2023-02-18 ENCOUNTER — Encounter: Payer: Self-pay | Admitting: Podiatry

## 2023-02-18 ENCOUNTER — Other Ambulatory Visit: Payer: Self-pay | Admitting: Internal Medicine

## 2023-02-18 DIAGNOSIS — Z5181 Encounter for therapeutic drug level monitoring: Secondary | ICD-10-CM | POA: Diagnosis not present

## 2023-02-18 DIAGNOSIS — L97521 Non-pressure chronic ulcer of other part of left foot limited to breakdown of skin: Secondary | ICD-10-CM

## 2023-02-18 DIAGNOSIS — I480 Paroxysmal atrial fibrillation: Secondary | ICD-10-CM | POA: Diagnosis not present

## 2023-02-18 DIAGNOSIS — G2581 Restless legs syndrome: Secondary | ICD-10-CM

## 2023-02-18 LAB — POCT INR: INR: 3.6 — AB (ref 2.0–3.0)

## 2023-02-18 NOTE — Patient Instructions (Signed)
HOLD TONIGHT ONLY THEN continue taking 1.5 tablets daily except for 1 tablet every Monday, Wednesday and Friday. Recheck INR in 4 weeks per pt availability.  [facility] [phone]

## 2023-02-18 NOTE — Progress Notes (Signed)
Chief Complaint  Patient presents with   Foot Ulcer    Follow up ulcer interdigital 4/5 left   "I was swollen and red a few weeks ago, but we had it checked at his doctor and it now looks much better. We were really worried about it until getting those antibiotics. So, we just kept the appointment to have it checked even though its doing much better now"    HPI: 72 y.o. male PMHx T2DM, PAD, CHF presenting today for follow-up evaluation of a symptomatic skin lesion between the fourth and fifth digits of the left foot.  Patient states that he is doing very well.  PSxHx TMA RT foot.    Past Medical History:  Diagnosis Date   AKI (acute kidney injury) ([name])    With STEMI in 2017   Chronic systolic CHF (congestive heart failure) ([name])    Depression    Diabetes mellitus without complication ([name])    Diabetic retinopathy ([name])    GERD (gastroesophageal reflux disease)    Hx of adenomatous colonic polyps 04/07/2018   Hyperlipidemia    Hypertension    Hypertensive retinopathy    Paroxysmal atrial fibrillation ([name])    Peripheral vascular disease ([name])    Pneumonia    Seizures ([name])    hx of as a child   STEMI (ST elevation myocardial infarction) ([name]) 2017    Past Surgical History:  Procedure Laterality Date   ABDOMINAL AORTOGRAM W/LOWER EXTREMITY N/A 09/19/2016   Procedure: Abdominal Aortogram w/Lower Extremity;  Surgeon: Iran Ouch, MD;  Location: [facility];  Service: Cardiovascular;  Laterality: N/A;   AMPUTATION Right 03/23/2016   Procedure: 1st and 2nd Ray Amputation Right Foot;  Surgeon: Nadara Mustard, MD;  Location: [facility] OR;  Service: Orthopedics;  Laterality: Right;   AMPUTATION Right 06/21/2016   Procedure: RIGHT TRANSMETATARSAL AMPUTATION;  Surgeon: Nadara Mustard, MD;  Location: [facility] OR;  Service: Orthopedics;  Laterality: Right;   CARDIAC CATHETERIZATION N/A 03/13/2016   Procedure: Right/Left Heart Cath and Coronary Angiography;  Surgeon: Tonny Bollman, MD;   Location: [facility] INVASIVE CV LAB;  Service: Cardiovascular;  Laterality: N/A;   CARDIAC CATHETERIZATION N/A 03/13/2016   Procedure: IABP Insertion;  Surgeon: Tonny Bollman, MD;  Location: [facility] INVASIVE CV LAB;  Service: Cardiovascular;  Laterality: N/A;   CATARACT EXTRACTION     CATARACT EXTRACTION, BILATERAL     CERVICAL FUSION  1982, 1992   has had 3 neck surgeries from breaking his neck   CIRCUMCISION N/A 11/07/2021   Procedure: CIRCUMCISION ADULT;  Surgeon: Despina Arias, MD;  Location: [facility] ORS;  Service: Urology;  Laterality: N/A;   CORONARY ARTERY BYPASS GRAFT N/A 03/13/2016   Procedure: CORONARY ARTERY BYPASS GRAFTING (CABG) x 1 (SVG to OM) with EVH from LEFT GREATER SAPHENOUS VEIN;  Surgeon: Kerin Perna, MD;  Location: [facility] OR;  Service: Open Heart Surgery;  Laterality: N/A;   EYE SURGERY     LOWER EXTREMITY ANGIOGRAM  05/02/2016   Procedure: Lower Extremity Angiogram;  Surgeon: Iran Ouch, MD;  Location: [facility];  Service: Cardiovascular;;  Limited left femoral runoff right femoral runoff   PERIPHERAL VASCULAR CATHETERIZATION N/A 05/02/2016   Procedure: Abdominal Aortogram;  Surgeon: Iran Ouch, MD;  Location: [facility];  Service: Cardiovascular;  Laterality: N/A;   PERIPHERAL VASCULAR CATHETERIZATION Right 05/02/2016   Procedure: Peripheral Vascular Balloon Angioplasty;  Surgeon: Iran Ouch, MD;  Location: [facility];  Service:  Cardiovascular;  Laterality: Right;  SFA   TEE WITHOUT CARDIOVERSION N/A 03/13/2016   Procedure: TRANSESOPHAGEAL ECHOCARDIOGRAM (TEE);  Surgeon: Kerin Perna, MD;  Location: [facility] OR;  Service: Open Heart Surgery;  Laterality: N/A;   VSD REPAIR N/A 03/13/2016   Procedure: VENTRICULAR SEPTAL DEFECT (VSD) REPAIR;  Surgeon: Kerin Perna, MD;  Location: [facility];  Service: Open Heart Surgery;  Laterality: N/A;    Allergies  Allergen Reactions   Morphine And Codeine Shortness Of Breath and Other (See Comments)     UNSPECIFIED REACTION "Pt said it was too much"    Latex Rash     LT fourth webspace 11/19/2022  Physical Exam: General: The patient is alert and oriented x3 in no acute distress.  Dermatology: The skin lesion to the interdigital area between the fourth and fifth digit is completely resolved.  There is some hyperkeratotic tissue down in the area which was lightly debrided today.  There is no maceration noted.  No erythema.  Clinically no indication of infection  Vascular: Known history of PVD.  Monitored closely by Dr. Lorine Bears MD 01/29/2022 ABI Findings:  +---------+------------------+-----+----------+--------------------------+  Right   Rt Pressure (mmHg)IndexWaveform  Comment                     +---------+------------------+-----+----------+--------------------------+  Brachial 122                                                          +---------+------------------+-----+----------+--------------------------+  PTA     70                0.56 monophasic                            +---------+------------------+-----+----------+--------------------------+  PERO    118               0.94 monophasic                            +---------+------------------+-----+----------+--------------------------+  DP      115               0.92 monophasic                            +---------+------------------+-----+----------+--------------------------+  Great Toe                                 transmetatarsal amputation  +---------+------------------+-----+----------+--------------------------+   +---------+------------------+-----+----------+-------+  Left    Lt Pressure (mmHg)IndexWaveform  Comment  +---------+------------------+-----+----------+-------+  Brachial 125                                       +---------+------------------+-----+----------+-------+  PTA     112               0.90 monophasic          +---------+------------------+-----+----------+-------+  PERO    113               0.90 monophasic         +---------+------------------+-----+----------+-------+  DP      112               0.90 monophasic         +---------+------------------+-----+----------+-------+  Great Toe55                0.44 Abnormal           +---------+------------------+-----+----------+-------+   +-------+-----------+-----------+------------+------------+  ABI/TBIToday's ABIToday's TBIPrevious ABIPrevious TBI  +-------+-----------+-----------+------------+------------+  Right 0.94       amp.       0.89        amp.          +-------+-----------+-----------+------------+------------+  Left  0.90       0.44       0.85        0.54          +-------+-----------+-----------+------------+------------+  Bilateral ABIs appear essentially unchanged compared to prior study on  01/25/21.  Summary:  Right: Resting right ankle-brachial index indicates mild right lower  extremity arterial disease.  Left: Resting left ankle-brachial index indicates mild left lower  extremity arterial disease. The left toe-brachial index is abnormal.   Neurological: Diminished  Musculoskeletal Exam: Prior transmetatarsal amputation right.  Assessment/Plan of Care: 1. T2DM; uncontrolled 2. TMA RLE 3. Interdigital maceration of skin 4th webspace LT; resolved  -Patient evaluated - Light debridement of the hyperkeratotic loosely adhered skin between the fourth and fifth digit was performed today. -The lesion is completely resolved.  No maceration noted -Recommend good supportive shoes and sneakers -Return to clinic as needed     Felecia Shelling, DPM [facility]  Dr. Felecia Shelling, DPM    [address] [place], [place] [address]                Office [phone]  Fax [phone]

## 2023-02-19 NOTE — Progress Notes (Signed)
[facility] - Clinic Note  02/25/2023    CHIEF COMPLAINT Patient presents for Retina Follow Up  HISTORY OF PRESENT ILLNESS: William [name] is a 72 y.o. male who presents to the clinic today for:   HPI     Retina Follow Up   Patient presents with  Diabetic Retinopathy.  In both eyes.  Severity is moderate.  Duration of 5 weeks.  Since onset it is stable.  I, the attending physician,  performed the HPI with the patient and updated documentation appropriately.        Comments   Pt here for 5 wk ret f/u NPDR OU. Pt states VA the same. Last BS was 142, recent A1C 7.7 in July.       Last edited by Rennis Chris, MD on 02/26/2023 11:32 PM.     Pt states vision is stable  Referring physician: [name], MD [address] [place],  [place] [address]  HISTORICAL INFORMATION:  Selected notes from the MEDICAL RECORD NUMBER Referred by Dr. Alben Spittle for eval of DME OU LEE:  Ocular Hx- PMH-    CURRENT MEDICATIONS: No current outpatient medications on file. (Ophthalmic Drugs)   No current facility-administered medications for this visit. (Ophthalmic Drugs)   Current Outpatient Medications (Other)  Medication Sig   acetaminophen (TYLENOL) 500 MG tablet Take 500 mg by mouth every 6 (six) hours as needed for moderate pain.   albuterol (PROVENTIL HFA;VENTOLIN HFA) 108 (90 Base) MCG/ACT inhaler Inhale 2 puffs into the lungs every 6 (six) hours as needed for wheezing or shortness of breath.   aspirin EC 81 MG tablet Take 1 tablet (81 mg total) by mouth daily.   carvedilol (COREG) 3.125 MG tablet TAKE 1 TABLET(3.125 MG) BY MOUTH TWICE DAILY WITH A MEAL   Castellani Paint 1.5 % LIQD Apply 1 application  topically 2 (two) times daily.   Continuous Blood Gluc Receiver (FREESTYLE LIBRE 2 READER) DEVI 1 Act by Does not apply route daily.   Continuous Glucose Sensor (FREESTYLE LIBRE 2 SENSOR) MISC APPLY A NEW SENSOR EVERY 14 DAYS   dapagliflozin propanediol (FARXIGA) 10  MG TABS tablet TAKE 1 TABLET(10 MG) BY MOUTH DAILY   gabapentin (NEURONTIN) 300 MG capsule TAKE 1 CAPSULE(300 MG) BY MOUTH TWICE DAILY   Glucagon (GVOKE HYPOPEN 2-PACK) 1 MG/0.2ML SOAJ Inject 1 Act into the skin daily as needed.   HUMALOG KWIKPEN 100 UNIT/ML KwikPen INJECT 10 UNITS UNDER THE SKIN THREE TIMES DAILY AS DIRECTED BY SLIDING SCALE (Patient taking differently: Inject 10-45 Units into the skin 3 (three) times daily. Pt takes with meals. Depends on sugar)   insulin glargine (LANTUS SOLOSTAR) 100 UNIT/ML Solostar Pen ADMINISTER 55 UNITS UNDER THE SKIN EVERY EVENING   Insulin Pen Needle 31G X 8 MM MISC 1 each by Does not apply route 4 (four) times daily.   pantoprazole (PROTONIX) 40 MG tablet TAKE 1 TABLET(40 MG) BY MOUTH TWICE DAILY BEFORE A MEAL   pramipexole (MIRAPEX) 0.125 MG tablet TAKE 1 TABLET(0.125 MG) BY MOUTH AT BEDTIME   rosuvastatin (CRESTOR) 40 MG tablet TAKE 1 TABLET BY MOUTH EVERY DAY   silver sulfADIAZINE (SILVADENE) 1 % cream Apply 1 Application topically daily.   torsemide (DEMADEX) 20 MG tablet TAKE 1 TABLET(20 MG) BY MOUTH 3 TIMES A WEEK Monday, Wednesday and Friday   VASCEPA 1 g capsule TAKE 2 CAPSULES(2 GRAMS) BY MOUTH TWICE DAILY   warfarin (COUMADIN) 5 MG tablet Take 1 to 1 &1/2  by  mouth daily as directed by the coumadin clinic (Patient taking differently: Take 1 to 1 &1/2  by mouth daily as directed by the coumadin clinic Take 7.5 mg by mouth daily (1.5 tablet of 5 mg) on Tues, Thurs, Sat and Sun. Takes 5 mg (one 5 mg tablet) on Mon, Wed and Fri.)   warfarin (COUMADIN) 5 MG tablet Take 5 mg by mouth daily.   No current facility-administered medications for this visit. (Other)   REVIEW OF SYSTEMS: ROS   Positive for: Gastrointestinal, Genitourinary, Endocrine, Eyes Negative for: Constitutional, Neurological, Skin, Musculoskeletal, HENT, Cardiovascular, Respiratory, Psychiatric, Allergic/Imm, Heme/Lymph Last edited by Thompson Grayer, COT on 02/25/2023  8:28  AM.     ALLERGIES Allergies  Allergen Reactions   Morphine And Codeine Shortness Of Breath and Other (See Comments)    UNSPECIFIED REACTION "Pt said it was too much"    Latex Rash   PAST MEDICAL HISTORY Past Medical History:  Diagnosis Date   AKI (acute kidney injury) ([name])    With STEMI in 2017   Chronic systolic CHF (congestive heart failure) ([name])    Depression    Diabetes mellitus without complication ([name])    Diabetic retinopathy ([name])    GERD (gastroesophageal reflux disease)    Hx of adenomatous colonic polyps 04/07/2018   Hyperlipidemia    Hypertension    Hypertensive retinopathy    Paroxysmal atrial fibrillation ([name])    Peripheral vascular disease ([name])    Pneumonia    Seizures ([name])    hx of as a child   STEMI (ST elevation myocardial infarction) ([name]) 2017   Past Surgical History:  Procedure Laterality Date   ABDOMINAL AORTOGRAM W/LOWER EXTREMITY N/A 09/19/2016   Procedure: Abdominal Aortogram w/Lower Extremity;  Surgeon: Iran Ouch, MD;  Location: [facility] INVASIVE CV LAB;  Service: Cardiovascular;  Laterality: N/A;   AMPUTATION Right 03/23/2016   Procedure: 1st and 2nd Ray Amputation Right Foot;  Surgeon: Nadara Mustard, MD;  Location: [facility] OR;  Service: Orthopedics;  Laterality: Right;   AMPUTATION Right 06/21/2016   Procedure: RIGHT TRANSMETATARSAL AMPUTATION;  Surgeon: Nadara Mustard, MD;  Location: [facility] OR;  Service: Orthopedics;  Laterality: Right;   CARDIAC CATHETERIZATION N/A 03/13/2016   Procedure: Right/Left Heart Cath and Coronary Angiography;  Surgeon: Tonny Bollman, MD;  Location: [facility] INVASIVE CV LAB;  Service: Cardiovascular;  Laterality: N/A;   CARDIAC CATHETERIZATION N/A 03/13/2016   Procedure: IABP Insertion;  Surgeon: Tonny Bollman, MD;  Location: [facility] INVASIVE CV LAB;  Service: Cardiovascular;  Laterality: N/A;   CATARACT EXTRACTION     CATARACT EXTRACTION, BILATERAL     CERVICAL FUSION  1982, 1992   has had 3 neck surgeries from breaking his  neck   CIRCUMCISION N/A 11/07/2021   Procedure: CIRCUMCISION ADULT;  Surgeon: Despina Arias, MD;  Location: [facility] ORS;  Service: Urology;  Laterality: N/A;   CORONARY ARTERY BYPASS GRAFT N/A 03/13/2016   Procedure: CORONARY ARTERY BYPASS GRAFTING (CABG) x 1 (SVG to OM) with EVH from LEFT GREATER SAPHENOUS VEIN;  Surgeon: Kerin Perna, MD;  Location: [facility] OR;  Service: Open Heart Surgery;  Laterality: N/A;   EYE SURGERY     LOWER EXTREMITY ANGIOGRAM  05/02/2016   Procedure: Lower Extremity Angiogram;  Surgeon: Iran Ouch, MD;  Location: [facility] INVASIVE CV LAB;  Service: Cardiovascular;;  Limited left femoral runoff right femoral runoff   PERIPHERAL VASCULAR CATHETERIZATION N/A 05/02/2016   Procedure: Abdominal Aortogram;  Surgeon: Iran Ouch, MD;  Location:  [facility] INVASIVE CV LAB;  Service: Cardiovascular;  Laterality: N/A;   PERIPHERAL VASCULAR CATHETERIZATION Right 05/02/2016   Procedure: Peripheral Vascular Balloon Angioplasty;  Surgeon: Iran Ouch, MD;  Location: [facility] INVASIVE CV LAB;  Service: Cardiovascular;  Laterality: Right;  SFA   TEE WITHOUT CARDIOVERSION N/A 03/13/2016   Procedure: TRANSESOPHAGEAL ECHOCARDIOGRAM (TEE);  Surgeon: Kerin Perna, MD;  Location: [facility] OR;  Service: Open Heart Surgery;  Laterality: N/A;   VSD REPAIR N/A 03/13/2016   Procedure: VENTRICULAR SEPTAL DEFECT (VSD) REPAIR;  Surgeon: Kerin Perna, MD;  Location: [facility] OR;  Service: Open Heart Surgery;  Laterality: N/A;   FAMILY HISTORY Family History  Problem Relation Age of Onset   Diabetes Maternal Grandmother    Diabetes Mother    Aneurysm Mother    Peripheral Artery Disease Mother    Coronary artery disease Mother    Peptic Ulcer Father    Retinoblastoma Daughter    Colon cancer Neg Hx    Rectal cancer Neg Hx    SOCIAL HISTORY Social History   Tobacco Use   Smoking status: Former    Current packs/day: 0.00    Types: Cigarettes    Quit date: 11/20/2015    Years since quitting: 7.2    Smokeless tobacco: Never   Tobacco comments:    quit 2018  Vaping Use   Vaping status: Never Used  Substance Use Topics   Alcohol use: No   Drug use: No       OPHTHALMIC EXAM: Base Eye Exam     Visual Acuity (Snellen - Linear)       Right Left   Dist [place] 20/25 20/20 -1   Dist ph [place] NI          Tonometry (Tonopen, 8:34 AM)       Right Left   Pressure 15 12         Pupils       Pupils Dark Light Shape React APD   Right PERRL 2 1 Round Brisk None   Left PERRL 2 1 Round Brisk None         Visual Fields (Counting fingers)       Left Right    Full Full         Extraocular Movement       Right Left    Full, Ortho Full, Ortho         Neuro/Psych     Oriented x3: Yes   Mood/Affect: Normal         Dilation     Both eyes: 1.0% Mydriacyl, 2.5% Phenylephrine @ 8:34 AM           Slit Lamp and Fundus Exam     Slit Lamp Exam       Right Left   Lids/Lashes Dermatochalasis - upper lid, mild MGD Dermatochalasis - upper lid, mild MGD   Conjunctiva/Sclera White and quiet White and quiet   Cornea 1+fine PEE, well healed cataract wound, mild tear film debris Trace PEE, mild tear film debris, well healed cataract wound   Anterior Chamber Deep and quiet Deep and quiet   Iris Round and dilated, No NVI Round and dilated, No NVI   Lens PC IOL in good position PC IOL in good position   Anterior Vitreous Vitreous syneresis, vitreous condensations Vitreous syneresis, Posterior vitreous detachment, vitreous condensations         Fundus Exam       Right Left   Disc trace Pallor,  Sharp rim mild Pallor, Sharp rim, mild tilt   C/D Ratio 0.3 0.3   Macula Flat, good foveal reflex, scatted Microaneurysms, persistent cystic changes -- slightly improved superiorly, possible targets for focal laser superiorly Flat, good foveal reflex, scattered MA -- improved, mild interval improvement in cystic changes temporal mac, good focal laser changes   Vessels attenuated,  mild tortuosity attenuated, Tortuous   Periphery Attached, +MA greatest posteriorly Attached, scattered MA greatest posteriorly           IMAGING AND PROCEDURES  Imaging and Procedures for 02/25/2023  OCT, Retina - OU - Both Eyes       Right Eye Quality was good. Central Foveal Thickness: 246. Progression has improved. Findings include no SRF, abnormal foveal contour, intraretinal hyper-reflective material, intraretinal fluid, vitreomacular adhesion (Mild interval improvement in IRF/IRHM greatest superior and mac ).   Left Eye Quality was good. Central Foveal Thickness: 244. Progression has improved. Findings include normal foveal contour, no SRF, intraretinal hyper-reflective material, intraretinal fluid (Mild interval improvement in IRF/ cystic changes temporal fovea and macula).   Notes *Images captured and stored on drive  Diagnosis / Impression:  +DME OU OD: Mild interval improvement in IRF/IRHM greatest superior and mac  OS: Mild interval improvement in IRF/ cystic changes temporal fovea and macula  Clinical management:  See below  Abbreviations: NFP - Normal foveal profile. CME - cystoid macular edema. PED - pigment epithelial detachment. IRF - intraretinal fluid. SRF - subretinal fluid. EZ - ellipsoid zone. ERM - epiretinal membrane. ORA - outer retinal atrophy. ORT - outer retinal tubulation. SRHM - subretinal hyper-reflective material. IRHM - intraretinal hyper-reflective material      Intravitreal Injection, Pharmacologic Agent - OD - Right Eye       Time Out 02/25/2023. 8:48 AM. Confirmed correct patient, procedure, site, and patient consented.   Anesthesia Topical anesthesia was used. Anesthetic medications included Lidocaine 2%, Proparacaine 0.5%.   Procedure Preparation included 5% betadine to ocular surface, eyelid speculum. A (32g) needle was used.   Injection: 2 mg aflibercept 2 MG/0.05ML   Route: Intravitreal, Site: Right Eye   NDC: L6038910,  Lot: [id number], Expiration date: 03/31/2024, Waste: 0 mL   Post-op Post injection exam found visual acuity of at least counting fingers. The patient tolerated the procedure well. There were no complications. The patient received written and verbal post procedure care education. Post injection medications were not given.             ASSESSMENT/PLAN:    ICD-10-CM   1. Moderate nonproliferative diabetic retinopathy of both eyes with macular edema associated with type 2 diabetes mellitus ([name])  E11.3313 OCT, Retina - OU - Both Eyes    Intravitreal Injection, Pharmacologic Agent - OD - Right Eye    aflibercept (EYLEA) SOLN 2 mg    2. Current use of insulin ([name])  Z79.4     3. Essential hypertension  I10     4. Hypertensive retinopathy of both eyes  H35.033     5. Pseudophakia, both eyes  Z96.1       1,2. Moderate non-proliferative diabetic retinopathy, both eyes  - last A1c was 8.0 on 11.06.23  - s/p IVA OD #1 (11.14.22), #2 (12.12.22), #3 (01.09.23) -- IVA resistance - s/p IVA OS #1 (10.17.22), #2 (11.14.22), #3 (12.12.22), #4 (01.09.23), #5 (03.15.23), #6 (04.12.23) -- IVA resistance - s/p IVE OD #1 (03.15.23), #2 (04.12.23), #3 (05.10.23), #4 (06.07.23), #5 (07.05.23), #6 (08.02.23), #7 (08.31.23), #8 (09.28.23), #9 (10.30.23), #10 (11.28.23), #11 (  12.28.23), #12 (01.29.24), #13 (02.20.24), #14 (03.25.24), #15 (04.22.24), #16 (05.20.24), #17 (06.17.24), #18 (07.22.24) - s/p IVE OS #1 (05.10.23), #2 (06.07.23), #3 (07.05.23), #4 (08.02.23), #5 (08.31.23), #6 (09.28.23), #7 (10.30.23), #8 (11.28.23), #9 (12.28.23), #10 (01.29.24), #11 (02.20..24), #12 (03.25.24), #13 (04.22.24), #14 (05.20.24), #15 (06.17.24), #16 (07.22.24) - s/p focal laser OS (06.25.24) - exam shows scattered MA, DBH OU - FA (10.17.22) shows late leaking MA OU, no NV OU - OCT shows OD: Mild interval improvement in IRF/IRHM greatest superior and mac; OS: Mild interval improvement in IRF/ cystic changes temporal  fovea and macula at 5 wks - BCVA OD: 20/25, 20/20 OS -- stable OU - recommend IVE OD #19 today, 08.26.24 w/ f/u in 5 wks again - will hold off on OS today -- pt in agreement - pt wishes to proceed with injection OD - RBA of procedure discussed, questions answered - IVE informed consent obtained and signed, 03.15.23 (OD) - IVE informed consent obtained and signed, 05.10.23 (OS) - see procedure note - follow up 5 weeks, DFE, OCT, possible injections  3,4. Hypertensive retinopathy OU - discussed importance of tight BP control and possibility of elevated BP blunting efficacy of IVE  - monitor  5. Pseudophakia OU  - s/p CE/IOL OU  - IOL in good position, doing well  - monitor  Ophthalmic Meds Ordered this visit:  Meds ordered this encounter  Medications   aflibercept (EYLEA) SOLN 2 mg     Return in about 5 weeks (around 04/01/2023) for f/u NPDR OU, DFE, OCT.  There are no Patient Instructions on file for this visit.  Explained the diagnoses, plan, and follow up with the patient and they expressed understanding.  Patient expressed understanding of the importance of proper follow up care.   This document serves as a record of services personally performed by Karie Chimera, MD, PhD. It was created on their behalf by Annalee Genta, COMT. The creation of this record is the provider's dictation and/or activities during the visit.  Electronically signed by: Annalee Genta, COMT 02/26/23 11:32 PM  This document serves as a record of services personally performed by Karie Chimera, MD, PhD. It was created on their behalf by Glee Arvin. Manson Passey, OA an ophthalmic technician. The creation of this record is the provider's dictation and/or activities during the visit.    Electronically signed by: Glee Arvin. Manson Passey, OA 02/26/23 11:32 PM  Karie Chimera, M.D., Ph.D. Diseases & Surgery of the Retina and Vitreous [facility]  I have reviewed the above documentation for accuracy  and completeness, and I agree with the above. Karie Chimera, M.D., Ph.D. 02/26/23 11:33 PM    Abbreviations: M myopia (nearsighted); A astigmatism; H hyperopia (farsighted); P presbyopia; Mrx spectacle prescription;  CTL contact lenses; OD right eye; OS left eye; OU both eyes  XT exotropia; ET esotropia; PEK punctate epithelial keratitis; PEE punctate epithelial erosions; DES dry eye syndrome; MGD meibomian gland dysfunction; ATs artificial tears; PFAT's preservative free artificial tears; NSC nuclear sclerotic cataract; PSC posterior subcapsular cataract; ERM epi-retinal membrane; PVD posterior vitreous detachment; RD retinal detachment; DM diabetes mellitus; DR diabetic retinopathy; NPDR non-proliferative diabetic retinopathy; PDR proliferative diabetic retinopathy; CSME clinically significant macular edema; DME diabetic macular edema; dbh dot blot hemorrhages; CWS cotton wool spot; POAG primary open angle glaucoma; C/D cup-to-disc ratio; HVF humphrey visual field; GVF goldmann visual field; OCT optical coherence tomography; IOP intraocular pressure; BRVO Branch retinal vein occlusion; CRVO central retinal vein occlusion; CRAO  central retinal artery occlusion; BRAO branch retinal artery occlusion; RT retinal tear; SB scleral buckle; PPV pars plana vitrectomy; VH Vitreous hemorrhage; PRP panretinal laser photocoagulation; IVK intravitreal kenalog; VMT vitreomacular traction; MH Macular hole;  NVD neovascularization of the disc; NVE neovascularization elsewhere; AREDS age related eye disease study; ARMD age related macular degeneration; POAG primary open angle glaucoma; EBMD epithelial/anterior basement membrane dystrophy; ACIOL anterior chamber intraocular lens; IOL intraocular lens; PCIOL posterior chamber intraocular lens; Phaco/IOL phacoemulsification with intraocular lens placement; PRK photorefractive keratectomy; LASIK laser assisted in situ keratomileusis; HTN hypertension; DM diabetes mellitus; COPD  chronic obstructive pulmonary disease

## 2023-02-24 ENCOUNTER — Other Ambulatory Visit: Payer: Self-pay | Admitting: Internal Medicine

## 2023-02-24 DIAGNOSIS — I10 Essential (primary) hypertension: Secondary | ICD-10-CM

## 2023-02-24 DIAGNOSIS — I5022 Chronic systolic (congestive) heart failure: Secondary | ICD-10-CM

## 2023-02-24 DIAGNOSIS — I2510 Atherosclerotic heart disease of native coronary artery without angina pectoris: Secondary | ICD-10-CM

## 2023-02-25 ENCOUNTER — Ambulatory Visit (INDEPENDENT_AMBULATORY_CARE_PROVIDER_SITE_OTHER): Payer: 59 | Admitting: Ophthalmology

## 2023-02-25 ENCOUNTER — Encounter (INDEPENDENT_AMBULATORY_CARE_PROVIDER_SITE_OTHER): Payer: Self-pay | Admitting: Ophthalmology

## 2023-02-25 DIAGNOSIS — Z794 Long term (current) use of insulin: Secondary | ICD-10-CM | POA: Diagnosis not present

## 2023-02-25 DIAGNOSIS — I10 Essential (primary) hypertension: Secondary | ICD-10-CM

## 2023-02-25 DIAGNOSIS — Z961 Presence of intraocular lens: Secondary | ICD-10-CM

## 2023-02-25 DIAGNOSIS — H35033 Hypertensive retinopathy, bilateral: Secondary | ICD-10-CM | POA: Diagnosis not present

## 2023-02-25 DIAGNOSIS — E113313 Type 2 diabetes mellitus with moderate nonproliferative diabetic retinopathy with macular edema, bilateral: Secondary | ICD-10-CM | POA: Diagnosis not present

## 2023-02-25 MED ORDER — AFLIBERCEPT 2MG/0.05ML IZ SOLN FOR KALEIDOSCOPE
2.0000 mg | INTRAVITREAL | Status: AC | PRN
Start: 2023-02-25 — End: 2023-02-25
  Administered 2023-02-25: 2 mg via INTRAVITREAL

## 2023-02-26 ENCOUNTER — Encounter (INDEPENDENT_AMBULATORY_CARE_PROVIDER_SITE_OTHER): Payer: Self-pay | Admitting: Ophthalmology

## 2023-02-28 ENCOUNTER — Other Ambulatory Visit: Payer: Self-pay | Admitting: Internal Medicine

## 2023-02-28 DIAGNOSIS — K219 Gastro-esophageal reflux disease without esophagitis: Secondary | ICD-10-CM

## 2023-03-18 ENCOUNTER — Ambulatory Visit: Payer: 59 | Attending: Cardiology

## 2023-03-18 DIAGNOSIS — Z5181 Encounter for therapeutic drug level monitoring: Secondary | ICD-10-CM

## 2023-03-18 DIAGNOSIS — I480 Paroxysmal atrial fibrillation: Secondary | ICD-10-CM | POA: Diagnosis not present

## 2023-03-18 LAB — POCT INR: INR: 2.4 (ref 2.0–3.0)

## 2023-03-18 NOTE — Patient Instructions (Signed)
continue taking 1.5 tablets daily except for 1 tablet every Monday, Wednesday and Friday.  Recheck INR in 4 weeks per pt availability.  Anticoagulation Clinic [phone]

## 2023-03-26 NOTE — Progress Notes (Signed)
[facility] - Clinic Note  04/01/2023    CHIEF COMPLAINT Patient presents for Retina Follow Up  HISTORY OF PRESENT ILLNESS: William [name] is a 72 y.o. male who presents to the clinic today for:   HPI     Retina Follow Up   Patient presents with  Diabetic Retinopathy.  In both eyes.  Severity is moderate.  Duration of 7 weeks.  Since onset it is stable.  I, the attending physician,  performed the HPI with the patient and updated documentation appropriately.        Comments   Pt here for 7 wk ret f/u NPDR OU. Pt states VA the same, no changes. No rx gtts, using ATS prn OU.       Last edited by Rennis Chris, MD on 04/01/2023  8:41 AM.    Pt states vision is the same, pts daughter feels like his BP has been high over the past couple of days  Referring physician: [name], MD [address] [place],  [place] [address]  HISTORICAL INFORMATION:  Selected notes from the MEDICAL RECORD NUMBER Referred by Dr. Alben Spittle for eval of DME OU LEE:  Ocular Hx- PMH-    CURRENT MEDICATIONS: No current outpatient medications on file. (Ophthalmic Drugs)   No current facility-administered medications for this visit. (Ophthalmic Drugs)   Current Outpatient Medications (Other)  Medication Sig   acetaminophen (TYLENOL) 500 MG tablet Take 500 mg by mouth every 6 (six) hours as needed for moderate pain.   albuterol (PROVENTIL HFA;VENTOLIN HFA) 108 (90 Base) MCG/ACT inhaler Inhale 2 puffs into the lungs every 6 (six) hours as needed for wheezing or shortness of breath.   aspirin EC 81 MG tablet Take 1 tablet (81 mg total) by mouth daily.   carvedilol (COREG) 3.125 MG tablet TAKE 1 TABLET(3.125 MG) BY MOUTH TWICE DAILY WITH A MEAL   Castellani Paint 1.5 % LIQD Apply 1 application  topically 2 (two) times daily.   Continuous Blood Gluc Receiver (FREESTYLE LIBRE 2 READER) DEVI 1 Act by Does not apply route daily.   Continuous Glucose Sensor (FREESTYLE LIBRE 2 SENSOR) MISC  APPLY A NEW SENSOR EVERY 14 DAYS   dapagliflozin propanediol (FARXIGA) 10 MG TABS tablet TAKE 1 TABLET(10 MG) BY MOUTH DAILY   gabapentin (NEURONTIN) 300 MG capsule TAKE 1 CAPSULE(300 MG) BY MOUTH TWICE DAILY   Glucagon (GVOKE HYPOPEN 2-PACK) 1 MG/0.2ML SOAJ Inject 1 Act into the skin daily as needed.   HUMALOG KWIKPEN 100 UNIT/ML KwikPen INJECT 10 UNITS UNDER THE SKIN THREE TIMES DAILY AS DIRECTED BY SLIDING SCALE (Patient taking differently: Inject 10-45 Units into the skin 3 (three) times daily. Pt takes with meals. Depends on sugar)   insulin glargine (LANTUS SOLOSTAR) 100 UNIT/ML Solostar Pen ADMINISTER 55 UNITS UNDER THE SKIN EVERY EVENING   Insulin Pen Needle 31G X 8 MM MISC 1 each by Does not apply route 4 (four) times daily.   pantoprazole (PROTONIX) 40 MG tablet TAKE 1 TABLET(40 MG) BY MOUTH TWICE DAILY BEFORE A MEAL   pramipexole (MIRAPEX) 0.125 MG tablet TAKE 1 TABLET(0.125 MG) BY MOUTH AT BEDTIME   rosuvastatin (CRESTOR) 40 MG tablet TAKE 1 TABLET BY MOUTH EVERY DAY   silver sulfADIAZINE (SILVADENE) 1 % cream Apply 1 Application topically daily.   torsemide (DEMADEX) 20 MG tablet TAKE 1 TABLET(20 MG) BY MOUTH 3 TIMES A WEEK Monday, Wednesday and Friday   VASCEPA 1 g capsule TAKE 2 CAPSULES(2 GRAMS) BY MOUTH  TWICE DAILY   warfarin (COUMADIN) 5 MG tablet Take 1 to 1 &1/2  by mouth daily as directed by the coumadin clinic (Patient taking differently: Take 1 to 1 &1/2  by mouth daily as directed by the coumadin clinic Take 7.5 mg by mouth daily (1.5 tablet of 5 mg) on Tues, Thurs, Sat and Sun. Takes 5 mg (one 5 mg tablet) on Mon, Wed and Fri.)   warfarin (COUMADIN) 5 MG tablet Take 5 mg by mouth daily.   No current facility-administered medications for this visit. (Other)   REVIEW OF SYSTEMS: ROS   Positive for: Gastrointestinal, Genitourinary, Endocrine, Eyes Negative for: Constitutional, Neurological, Skin, Musculoskeletal, HENT, Cardiovascular, Respiratory, Psychiatric,  Allergic/Imm, Heme/Lymph Last edited by Thompson Grayer, COT on 04/01/2023  7:46 AM.      ALLERGIES Allergies  Allergen Reactions   Morphine And Codeine Shortness Of Breath and Other (See Comments)    UNSPECIFIED REACTION "Pt said it was too much"    Latex Rash   PAST MEDICAL HISTORY Past Medical History:  Diagnosis Date   AKI (acute kidney injury) ([name])    With STEMI in 2017   Chronic systolic CHF (congestive heart failure) ([name])    Depression    Diabetes mellitus without complication ([name])    Diabetic retinopathy ([name])    GERD (gastroesophageal reflux disease)    Hx of adenomatous colonic polyps 04/07/2018   Hyperlipidemia    Hypertension    Hypertensive retinopathy    Paroxysmal atrial fibrillation ([name])    Peripheral vascular disease ([name])    Pneumonia    Seizures ([name])    hx of as a child   STEMI (ST elevation myocardial infarction) ([name]) 2017   Past Surgical History:  Procedure Laterality Date   ABDOMINAL AORTOGRAM W/LOWER EXTREMITY N/A 09/19/2016   Procedure: Abdominal Aortogram w/Lower Extremity;  Surgeon: Iran Ouch, MD;  Location: [facility] INVASIVE CV LAB;  Service: Cardiovascular;  Laterality: N/A;   AMPUTATION Right 03/23/2016   Procedure: 1st and 2nd Ray Amputation Right Foot;  Surgeon: Nadara Mustard, MD;  Location: [facility] OR;  Service: Orthopedics;  Laterality: Right;   AMPUTATION Right 06/21/2016   Procedure: RIGHT TRANSMETATARSAL AMPUTATION;  Surgeon: Nadara Mustard, MD;  Location: [facility] OR;  Service: Orthopedics;  Laterality: Right;   CARDIAC CATHETERIZATION N/A 03/13/2016   Procedure: Right/Left Heart Cath and Coronary Angiography;  Surgeon: Tonny Bollman, MD;  Location: [facility] INVASIVE CV LAB;  Service: Cardiovascular;  Laterality: N/A;   CARDIAC CATHETERIZATION N/A 03/13/2016   Procedure: IABP Insertion;  Surgeon: Tonny Bollman, MD;  Location: [facility] INVASIVE CV LAB;  Service: Cardiovascular;  Laterality: N/A;   CATARACT EXTRACTION     CATARACT EXTRACTION,  BILATERAL     CERVICAL FUSION  1982, 1992   has had 3 neck surgeries from breaking his neck   CIRCUMCISION N/A 11/07/2021   Procedure: CIRCUMCISION ADULT;  Surgeon: Despina Arias, MD;  Location: [facility] ORS;  Service: Urology;  Laterality: N/A;   CORONARY ARTERY BYPASS GRAFT N/A 03/13/2016   Procedure: CORONARY ARTERY BYPASS GRAFTING (CABG) x 1 (SVG to OM) with EVH from LEFT GREATER SAPHENOUS VEIN;  Surgeon: Kerin Perna, MD;  Location: [facility] OR;  Service: Open Heart Surgery;  Laterality: N/A;   EYE SURGERY     LOWER EXTREMITY ANGIOGRAM  05/02/2016   Procedure: Lower Extremity Angiogram;  Surgeon: Iran Ouch, MD;  Location: [facility] INVASIVE CV LAB;  Service: Cardiovascular;;  Limited left femoral runoff right femoral runoff   PERIPHERAL  VASCULAR CATHETERIZATION N/A 05/02/2016   Procedure: Abdominal Aortogram;  Surgeon: Iran Ouch, MD;  Location: [facility] CV [facility];  Service: Cardiovascular;  Laterality: N/A;   PERIPHERAL VASCULAR CATHETERIZATION Right 05/02/2016   Procedure: Peripheral Vascular Balloon Angioplasty;  Surgeon: Iran Ouch, MD;  Location: [facility] INVASIVE CV LAB;  Service: Cardiovascular;  Laterality: Right;  SFA   TEE WITHOUT CARDIOVERSION N/A 03/13/2016   Procedure: TRANSESOPHAGEAL ECHOCARDIOGRAM (TEE);  Surgeon: Kerin Perna, MD;  Location: [facility] OR;  Service: Open Heart Surgery;  Laterality: N/A;   VSD REPAIR N/A 03/13/2016   Procedure: VENTRICULAR SEPTAL DEFECT (VSD) REPAIR;  Surgeon: Kerin Perna, MD;  Location: [facility] OR;  Service: Open Heart Surgery;  Laterality: N/A;   FAMILY HISTORY Family History  Problem Relation Age of Onset   Diabetes Maternal Grandmother    Diabetes Mother    Aneurysm Mother    Peripheral Artery Disease Mother    Coronary artery disease Mother    Peptic Ulcer Father    Retinoblastoma Daughter    Colon cancer Neg Hx    Rectal cancer Neg Hx    SOCIAL HISTORY Social History   Tobacco Use   Smoking status: Former    Current packs/day:  0.00    Types: Cigarettes    Quit date: 11/20/2015    Years since quitting: 7.3   Smokeless tobacco: Never   Tobacco comments:    quit 2018  Vaping Use   Vaping status: Never Used  Substance Use Topics   Alcohol use: No   Drug use: No       OPHTHALMIC EXAM: Base Eye Exam     Visual Acuity (Snellen - Linear)       Right Left   Dist Alex 20/25 20/30   Dist ph [place] NI 20/25 -1         Tonometry (Tonopen, 7:50 AM)       Right Left   Pressure 10 9         Pupils       Pupils Dark Light Shape React APD   Right PERRL 2 1 Round Brisk None   Left PERRL 2 1 Round Brisk None         Visual Fields (Counting fingers)       Left Right    Full Full         Extraocular Movement       Right Left    Full, Ortho Full, Ortho         Neuro/Psych     Oriented x3: Yes   Mood/Affect: Normal         Dilation     Both eyes: 1.0% Mydriacyl, 2.5% Phenylephrine @ 7:51 AM           Slit Lamp and Fundus Exam     Slit Lamp Exam       Right Left   Lids/Lashes Dermatochalasis - upper lid, mild MGD Dermatochalasis - upper lid, mild MGD   Conjunctiva/Sclera White and quiet White and quiet   Cornea 1+fine PEE, well healed cataract wound, mild tear film debris Trace PEE, mild tear film debris, well healed cataract wound   Anterior Chamber Deep and quiet Deep and quiet   Iris Round and dilated, No NVI Round and dilated, No NVI   Lens PC IOL in good position PC IOL in good position   Anterior Vitreous Vitreous syneresis, vitreous condensations Vitreous syneresis, Posterior vitreous detachment, vitreous condensations  Fundus Exam       Right Left   Disc trace Pallor, Sharp rim mild Pallor, Sharp rim, mild tilt   C/D Ratio 0.3 0.3   Macula Flat, good foveal reflex, scatted Microaneurysms, persistent cystic changes -- slightly improved superiorly, possible targets for focal laser superiorly Flat, good foveal reflex, scattered MA -- improved, mild interval  increase in cystic changes temporal mac, good focal laser changes   Vessels attenuated, mild tortuosity attenuated, Tortuous   Periphery Attached, +MA greatest posteriorly Attached, scattered MA greatest posteriorly           IMAGING AND PROCEDURES  Imaging and Procedures for 04/01/2023  OCT, Retina - OU - Both Eyes       Right Eye Quality was good. Central Foveal Thickness: 239. Progression has improved. Findings include no SRF, abnormal foveal contour, intraretinal hyper-reflective material, intraretinal fluid, vitreomacular adhesion (Mild interval improvement in IRF/IRHM greatest superior mac ).   Left Eye Quality was good. Central Foveal Thickness: 254. Progression has worsened. Findings include normal foveal contour, no SRF, intraretinal hyper-reflective material, intraretinal fluid (Mild interval increase in IRF/ cystic changes temporal fovea and macula).   Notes *Images captured and stored on drive  Diagnosis / Impression:  +DME OU OD: Mild interval improvement in IRF/IRHM greatest superior mac  OS: Mild interval increase in IRF/ cystic changes temporal fovea and macula  Clinical management:  See below  Abbreviations: NFP - Normal foveal profile. CME - cystoid macular edema. PED - pigment epithelial detachment. IRF - intraretinal fluid. SRF - subretinal fluid. EZ - ellipsoid zone. ERM - epiretinal membrane. ORA - outer retinal atrophy. ORT - outer retinal tubulation. SRHM - subretinal hyper-reflective material. IRHM - intraretinal hyper-reflective material      Intravitreal Injection, Pharmacologic Agent - OD - Right Eye       Time Out 04/01/2023. 8:11 AM. Confirmed correct patient, procedure, site, and patient consented.   Anesthesia Topical anesthesia was used. Anesthetic medications included Lidocaine 2%, Proparacaine 0.5%.   Procedure Preparation included 5% betadine to ocular surface, eyelid speculum. A (32g) needle was used.   Injection: 2 mg aflibercept 2  MG/0.05ML   Route: Intravitreal, Site: Right Eye   NDC: L6038910, Lot: [id number], Expiration date: 05/31/2024, Waste: 0 mL   Post-op Post injection exam found visual acuity of at least counting fingers. The patient tolerated the procedure well. There were no complications. The patient received written and verbal post procedure care education. Post injection medications were not given.      Intravitreal Injection, Pharmacologic Agent - OS - Left Eye       Time Out 04/01/2023. 8:30 AM. Confirmed correct patient, procedure, site, and patient consented.   Anesthesia Topical anesthesia was used. Anesthetic medications included Lidocaine 2%, Proparacaine 0.5%.   Procedure Preparation included 5% betadine to ocular surface, eyelid speculum. A (32g) needle was used.   Injection: 2 mg aflibercept 2 MG/0.05ML   Route: Intravitreal, Site: Left Eye   NDC: L6038910, Lot: [id number], Expiration date: 05/31/2024, Waste: 0 mL   Post-op Post injection exam found visual acuity of at least counting fingers. The patient tolerated the procedure well. There were no complications. The patient received written and verbal post procedure care education. Post injection medications were not given.            ASSESSMENT/PLAN:    ICD-10-CM   1. Moderate nonproliferative diabetic retinopathy of both eyes with macular edema associated with type 2 diabetes mellitus ([name])  E11.3313 OCT, Retina - OU -  Both Eyes    Intravitreal Injection, Pharmacologic Agent - OD - Right Eye    Intravitreal Injection, Pharmacologic Agent - OS - Left Eye    aflibercept (EYLEA) SOLN 2 mg    aflibercept (EYLEA) SOLN 2 mg    2. Current use of insulin ([name])  Z79.4     3. Essential hypertension  I10     4. Hypertensive retinopathy of both eyes  H35.033     5. Pseudophakia, both eyes  Z96.1      1,2. Moderate non-proliferative diabetic retinopathy, both eyes  - last A1c was 8.0 on 11.06.23  - s/p IVA OD #1  (11.14.22), #2 (12.12.22), #3 (01.09.23) -- IVA resistance - s/p IVA OS #1 (10.17.22), #2 (11.14.22), #3 (12.12.22), #4 (01.09.23), #5 (03.15.23), #6 (04.12.23) -- IVA resistance - s/p IVE OD #1 (03.15.23), #2 (04.12.23), #3 (05.10.23), #4 (06.07.23), #5 (07.05.23), #6 (08.02.23), #7 (08.31.23), #8 (09.28.23), #9 (10.30.23), #10 (11.28.23), #11 (12.28.23), #12 (01.29.24), #13 (02.20.24), #14 (03.25.24), #15 (04.22.24), #16 (05.20.24), #17 (06.17.24), #18 (07.22.24), #19 (08.26.24) - s/p IVE OS #1 (05.10.23), #2 (06.07.23), #3 (07.05.23), #4 (08.02.23), #5 (08.31.23), #6 (09.28.23), #7 (10.30.23), #8 (11.28.23), #9 (12.28.23), #10 (01.29.24), #11 (02.20..24), #12 (03.25.24), #13 (04.22.24), #14 (05.20.24), #15 (06.17.24), #16 (07.22.24) - s/p focal laser OS (06.25.24) - exam shows scattered MA, DBH OU - FA (10.17.22) shows late leaking MA OU, no NV OU - OCT shows  OD: Mild interval improvement in IRF/IRHM greatest superior mac; OS: Mild interval increase in IRF/ cystic changes temporal fovea and maculaat 5 wks - BCVA OD: 20/25 - stable, OS: 20/25 from 20/20 - recommend IVE OD #20 and IVE OS #17 today, 09.30.24 w/ f/u in 5 wks again - pt wishes to proceed with injection OU - RBA of procedure discussed, questions answered - IVE informed consent obtained and signed, 03.15.23 (OD) - IVE informed consent obtained and signed, 05.10.23 (OS) - see procedure note - follow up 5 weeks, DFE, OCT, possible injections -- renew consents  3,4. Hypertensive retinopathy OU - discussed importance of tight BP control and possibility of elevated BP blunting efficacy of IVE  - monitor  5. Pseudophakia OU  - s/p CE/IOL OU  - IOL in good position, doing well  - monitor  Ophthalmic Meds Ordered this visit:  Meds ordered this encounter  Medications   aflibercept (EYLEA) SOLN 2 mg   aflibercept (EYLEA) SOLN 2 mg     Return in about 5 weeks (around 05/06/2023) for f/u NPDR OU, DFE, OCT.  There are no Patient  Instructions on file for this visit.  Explained the diagnoses, plan, and follow up with the patient and they expressed understanding.  Patient expressed understanding of the importance of proper follow up care.   This document serves as a record of services personally performed by Karie Chimera, MD, PhD. It was created on their behalf by Annalee Genta, COMT. The creation of this record is the provider's dictation and/or activities during the visit.  Electronically signed by: Annalee Genta, COMT 04/01/23 8:42 AM  This document serves as a record of services personally performed by Karie Chimera, MD, PhD. It was created on their behalf by Glee Arvin. Manson Passey, OA an ophthalmic technician. The creation of this record is the provider's dictation and/or activities during the visit.    Electronically signed by: Glee Arvin. Manson Passey, OA 04/01/23 8:42 AM  Karie Chimera, M.D., Ph.D. Diseases & Surgery of the Retina and Vitreous [facility]  I have reviewed the above documentation for accuracy and completeness, and I agree with the above. Karie Chimera, M.D., Ph.D. 04/01/23 8:44 AM  Abbreviations: M myopia (nearsighted); A astigmatism; H hyperopia (farsighted); P presbyopia; Mrx spectacle prescription;  CTL contact lenses; OD right eye; OS left eye; OU both eyes  XT exotropia; ET esotropia; PEK punctate epithelial keratitis; PEE punctate epithelial erosions; DES dry eye syndrome; MGD meibomian gland dysfunction; ATs artificial tears; PFAT's preservative free artificial tears; NSC nuclear sclerotic cataract; PSC posterior subcapsular cataract; ERM epi-retinal membrane; PVD posterior vitreous detachment; RD retinal detachment; DM diabetes mellitus; DR diabetic retinopathy; NPDR non-proliferative diabetic retinopathy; PDR proliferative diabetic retinopathy; CSME clinically significant macular edema; DME diabetic macular edema; dbh dot blot hemorrhages; CWS cotton wool spot; POAG primary open  angle glaucoma; C/D cup-to-disc ratio; HVF humphrey visual field; GVF goldmann visual field; OCT optical coherence tomography; IOP intraocular pressure; BRVO Branch retinal vein occlusion; CRVO central retinal vein occlusion; CRAO central retinal artery occlusion; BRAO branch retinal artery occlusion; RT retinal tear; SB scleral buckle; PPV pars plana vitrectomy; VH Vitreous hemorrhage; PRP panretinal laser photocoagulation; IVK intravitreal kenalog; VMT vitreomacular traction; MH Macular hole;  NVD neovascularization of the disc; NVE neovascularization elsewhere; AREDS age related eye disease study; ARMD age related macular degeneration; POAG primary open angle glaucoma; EBMD epithelial/anterior basement membrane dystrophy; ACIOL anterior chamber intraocular lens; IOL intraocular lens; PCIOL posterior chamber intraocular lens; Phaco/IOL phacoemulsification with intraocular lens placement; PRK photorefractive keratectomy; LASIK laser assisted in situ keratomileusis; HTN hypertension; DM diabetes mellitus; COPD chronic obstructive pulmonary disease

## 2023-04-01 ENCOUNTER — Ambulatory Visit (INDEPENDENT_AMBULATORY_CARE_PROVIDER_SITE_OTHER): Payer: 59 | Admitting: Ophthalmology

## 2023-04-01 ENCOUNTER — Encounter (INDEPENDENT_AMBULATORY_CARE_PROVIDER_SITE_OTHER): Payer: Self-pay | Admitting: Ophthalmology

## 2023-04-01 DIAGNOSIS — E113313 Type 2 diabetes mellitus with moderate nonproliferative diabetic retinopathy with macular edema, bilateral: Secondary | ICD-10-CM | POA: Diagnosis not present

## 2023-04-01 DIAGNOSIS — Z961 Presence of intraocular lens: Secondary | ICD-10-CM | POA: Diagnosis not present

## 2023-04-01 DIAGNOSIS — Z794 Long term (current) use of insulin: Secondary | ICD-10-CM

## 2023-04-01 DIAGNOSIS — I10 Essential (primary) hypertension: Secondary | ICD-10-CM | POA: Diagnosis not present

## 2023-04-01 DIAGNOSIS — H35033 Hypertensive retinopathy, bilateral: Secondary | ICD-10-CM

## 2023-04-01 MED ORDER — AFLIBERCEPT 2MG/0.05ML IZ SOLN FOR KALEIDOSCOPE
2.0000 mg | INTRAVITREAL | Status: AC | PRN
Start: 2023-04-01 — End: 2023-04-01
  Administered 2023-04-01: 2 mg via INTRAVITREAL

## 2023-04-09 ENCOUNTER — Telehealth: Payer: Self-pay | Admitting: Internal Medicine

## 2023-04-09 NOTE — Telephone Encounter (Signed)
Patient's daughter would like to know if Dr. Yetta Barre can write a letter stating her parents need her to care for them. She said her job requires documentation. Best callback for William [name] is [phone].

## 2023-04-15 ENCOUNTER — Ambulatory Visit: Payer: 59 | Attending: Cardiology

## 2023-04-15 ENCOUNTER — Other Ambulatory Visit: Payer: Self-pay | Admitting: Internal Medicine

## 2023-04-15 DIAGNOSIS — I480 Paroxysmal atrial fibrillation: Secondary | ICD-10-CM | POA: Diagnosis not present

## 2023-04-15 DIAGNOSIS — Z5181 Encounter for therapeutic drug level monitoring: Secondary | ICD-10-CM

## 2023-04-15 LAB — POCT INR: INR: 2.5 (ref 2.0–3.0)

## 2023-04-15 NOTE — Patient Instructions (Signed)
continue taking 1.5 tablets daily except for 1 tablet every Monday, Wednesday and Friday. Recheck INR in 6 weeks per pt availability.  Anticoagulation Clinic [phone]

## 2023-04-29 ENCOUNTER — Encounter: Payer: Self-pay | Admitting: Internal Medicine

## 2023-04-29 ENCOUNTER — Ambulatory Visit (INDEPENDENT_AMBULATORY_CARE_PROVIDER_SITE_OTHER): Payer: 59 | Admitting: Internal Medicine

## 2023-04-29 VITALS — BP 116/68 | HR 93 | Temp 98.4°F | Ht 65.0 in | Wt 195.6 lb

## 2023-04-29 DIAGNOSIS — Z23 Encounter for immunization: Secondary | ICD-10-CM | POA: Diagnosis not present

## 2023-04-29 DIAGNOSIS — Z794 Long term (current) use of insulin: Secondary | ICD-10-CM | POA: Diagnosis not present

## 2023-04-29 DIAGNOSIS — E119 Type 2 diabetes mellitus without complications: Secondary | ICD-10-CM | POA: Diagnosis not present

## 2023-04-29 DIAGNOSIS — D696 Thrombocytopenia, unspecified: Secondary | ICD-10-CM

## 2023-04-29 DIAGNOSIS — D539 Nutritional anemia, unspecified: Secondary | ICD-10-CM | POA: Diagnosis not present

## 2023-04-29 DIAGNOSIS — R3121 Asymptomatic microscopic hematuria: Secondary | ICD-10-CM | POA: Diagnosis not present

## 2023-04-29 DIAGNOSIS — I10 Essential (primary) hypertension: Secondary | ICD-10-CM

## 2023-04-29 LAB — URINALYSIS, ROUTINE W REFLEX MICROSCOPIC
Bilirubin Urine: NEGATIVE
Hgb urine dipstick: NEGATIVE
Ketones, ur: NEGATIVE
Leukocytes,Ua: NEGATIVE
Nitrite: NEGATIVE
Specific Gravity, Urine: 1.01 (ref 1.000–1.030)
Total Protein, Urine: NEGATIVE
Urine Glucose: >=1000 — AB
Urobilinogen, UA: 1 (ref 0.0–1.0)
pH: 6 (ref 5.0–8.0)

## 2023-04-29 LAB — CBC WITH DIFFERENTIAL/PLATELET
Basophils Absolute: 0 10*3/uL (ref 0.0–0.1)
Basophils Relative: 0.1 % (ref 0.0–3.0)
Eosinophils Absolute: 0.1 10*3/uL (ref 0.0–0.7)
Eosinophils Relative: 0.4 % (ref 0.0–5.0)
HCT: 40.3 % (ref 39.0–52.0)
Hemoglobin: 13 g/dL (ref 13.0–17.0)
Lymphocytes Relative: 11.2 % — ABNORMAL LOW (ref 12.0–46.0)
Lymphs Abs: 1.3 10*3/uL (ref 0.7–4.0)
MCHC: 32.2 g/dL (ref 30.0–36.0)
MCV: 87.5 fl (ref 78.0–100.0)
Monocytes Absolute: 0.7 10*3/uL (ref 0.1–1.0)
Monocytes Relative: 6.3 % (ref 3.0–12.0)
Neutro Abs: 9.6 10*3/uL — ABNORMAL HIGH (ref 1.4–7.7)
Neutrophils Relative %: 82 % — ABNORMAL HIGH (ref 43.0–77.0)
Platelets: 166 10*3/uL (ref 150.0–400.0)
RBC: 4.61 Mil/uL (ref 4.22–5.81)
RDW: 15 % (ref 11.5–15.5)
WBC: 11.7 10*3/uL — ABNORMAL HIGH (ref 4.0–10.5)

## 2023-04-29 LAB — IBC + FERRITIN
Ferritin: 77.3 ng/mL (ref 22.0–322.0)
Iron: 44 ug/dL (ref 42–165)
Saturation Ratios: 12.2 % — ABNORMAL LOW (ref 20.0–50.0)
TIBC: 359.8 ug/dL (ref 250.0–450.0)
Transferrin: 257 mg/dL (ref 212.0–360.0)

## 2023-04-29 LAB — MICROALBUMIN / CREATININE URINE RATIO
Creatinine,U: 37.8 mg/dL
Microalb Creat Ratio: 6.9 mg/g (ref 0.0–30.0)
Microalb, Ur: 2.6 mg/dL — ABNORMAL HIGH (ref 0.0–1.9)

## 2023-04-29 LAB — BASIC METABOLIC PANEL
BUN: 43 mg/dL — ABNORMAL HIGH (ref 6–23)
CO2: 26 meq/L (ref 19–32)
Calcium: 9.7 mg/dL (ref 8.4–10.5)
Chloride: 102 meq/L (ref 96–112)
Creatinine, Ser: 1.93 mg/dL — ABNORMAL HIGH (ref 0.40–1.50)
GFR: 34.29 mL/min — ABNORMAL LOW (ref >60.00)
Glucose, Bld: 374 mg/dL — ABNORMAL HIGH (ref 70–99)
Potassium: 4.5 meq/L (ref 3.5–5.1)
Sodium: 137 meq/L (ref 135–145)

## 2023-04-29 LAB — VITAMIN B12: Vitamin B-12: 717 pg/mL (ref 211–911)

## 2023-04-29 LAB — HEMOGLOBIN A1C: Hgb A1c MFr Bld: 8 % — ABNORMAL HIGH (ref 4.6–6.5)

## 2023-04-29 LAB — FOLATE: Folate: 18.8 ng/mL

## 2023-04-29 MED ORDER — LANTUS SOLOSTAR 100 UNIT/ML ~~LOC~~ SOPN: 50.0000 [IU] | PEN_INJECTOR | Freq: Every day | SUBCUTANEOUS | 0 refills | Status: DC

## 2023-04-29 MED ORDER — FREESTYLE LIBRE 2 READER DEVI: 1.0000 | Freq: Every day | 5 refills | Status: DC

## 2023-04-29 MED ORDER — DEXCOM G7 RECEIVER DEVI: 1.0000 | Freq: Every day | 1 refills | Status: AC

## 2023-04-29 MED ORDER — DEXCOM G7 SENSOR MISC: 1.0000 | Freq: Every day | 1 refills | Status: DC

## 2023-04-29 MED ORDER — INSULIN PEN NEEDLE 31G X 8 MM MISC: 1.0000 | Freq: Four times a day (QID) | 2 refills | Status: DC

## 2023-04-29 NOTE — Patient Instructions (Signed)

## 2023-04-29 NOTE — Progress Notes (Unsigned)
Subjective:  Patient ID: William [name], male    DOB: 31-May-1951  Age: 72 y.o. MRN: [id number]  CC: Anemia   HPI William [name] presents for f/up ----   Outpatient Medications Prior to Visit  Medication Sig Dispense Refill   acetaminophen (TYLENOL) 500 MG tablet Take 500 mg by mouth every 6 (six) hours as needed for moderate pain.     albuterol (PROVENTIL HFA;VENTOLIN HFA) 108 (90 Base) MCG/ACT inhaler Inhale 2 puffs into the lungs every 6 (six) hours as needed for wheezing or shortness of breath. 1 Inhaler 0   carvedilol (COREG) 3.125 MG tablet TAKE 1 TABLET(3.125 MG) BY MOUTH TWICE DAILY WITH A MEAL 180 tablet 1   Castellani Paint 1.5 % LIQD Apply 1 application  topically 2 (two) times daily. 29.57 mL 1   Continuous Glucose Sensor (FREESTYLE LIBRE 2 SENSOR) MISC APPLY A NEW SENSOR EVERY 14 DAYS 2 each 5   dapagliflozin propanediol (FARXIGA) 10 MG TABS tablet TAKE 1 TABLET(10 MG) BY MOUTH DAILY 90 tablet 1   gabapentin (NEURONTIN) 300 MG capsule TAKE 1 CAPSULE(300 MG) BY MOUTH TWICE DAILY 180 capsule 1   Glucagon (GVOKE HYPOPEN 2-PACK) 1 MG/0.2ML SOAJ Inject 1 Act into the skin daily as needed. 2 mL 5   HUMALOG KWIKPEN 100 UNIT/ML KwikPen INJECT 10 UNITS UNDER THE SKIN THREE TIMES DAILY AS DIRECTED BY SLIDING SCALE (Patient taking differently: Inject 10-45 Units into the skin 3 (three) times daily. Pt takes with meals. Depends on sugar) 27 mL 1   pantoprazole (PROTONIX) 40 MG tablet TAKE 1 TABLET(40 MG) BY MOUTH TWICE DAILY BEFORE A MEAL 180 tablet 0   pramipexole (MIRAPEX) 0.125 MG tablet TAKE 1 TABLET(0.125 MG) BY MOUTH AT BEDTIME 90 tablet 1   rosuvastatin (CRESTOR) 40 MG tablet TAKE 1 TABLET BY MOUTH EVERY DAY 90 tablet 1   silver sulfADIAZINE (SILVADENE) 1 % cream Apply 1 Application topically daily. 400 g 2   torsemide (DEMADEX) 20 MG tablet TAKE 1 TABLET(20 MG) BY MOUTH 3 TIMES A WEEK Monday, Wednesday and Friday 38 tablet 1   VASCEPA 1 g capsule TAKE 2 CAPSULES(2 GRAMS) BY  MOUTH TWICE DAILY 360 capsule 1   warfarin (COUMADIN) 5 MG tablet Take 1 to 1 &1/2  by mouth daily as directed by the coumadin clinic (Patient taking differently: Take 1 to 1 &1/2  by mouth daily as directed by the coumadin clinic Take 7.5 mg by mouth daily (1.5 tablet of 5 mg) on Tues, Thurs, Sat and Sun. Takes 5 mg (one 5 mg tablet) on Mon, Wed and Fri.) 120 tablet 0   warfarin (COUMADIN) 5 MG tablet Take 5 mg by mouth daily.     aspirin EC 81 MG tablet Take 1 tablet (81 mg total) by mouth daily. 30 tablet 3   Continuous Blood Gluc Receiver (FREESTYLE LIBRE 2 READER) DEVI 1 Act by Does not apply route daily. 2 each 5   insulin glargine (LANTUS SOLOSTAR) 100 UNIT/ML Solostar Pen ADMINISTER 55 UNITS UNDER THE SKIN EVERY EVENING 51 mL 0   Insulin Pen Needle 31G X 8 MM MISC 1 each by Does not apply route 4 (four) times daily. 300 each 2   No facility-administered medications prior to visit.    ROS Review of Systems  Objective:  BP 116/68 (BP Location: Left Arm, Patient Position: Sitting, Cuff Size: Normal)   Pulse 93   Temp 98.4 F (36.9 C) (Oral)   Ht 5\' 5"  (1.651 m)  Wt 195 lb 9.6 oz (88.7 kg)   SpO2 91%   BMI 32.55 kg/m   BP Readings from Last 3 Encounters:  04/29/23 116/68  01/28/23 124/84  01/22/23 (!) 112/54    Wt Readings from Last 3 Encounters:  04/29/23 195 lb 9.6 oz (88.7 kg)  01/28/23 186 lb (84.4 kg)  01/22/23 186 lb (84.4 kg)    Physical Exam  Lab Results  Component Value Date   WBC 11.7 (H) 04/29/2023   HGB 13.0 04/29/2023   HCT 40.3 04/29/2023   PLT 166.0 04/29/2023   GLUCOSE 374 (H) 04/29/2023   CHOL 126 11/05/2022   TRIG 290.0 (H) 11/05/2022   HDL 32.10 (L) 11/05/2022   LDLDIRECT 54.0 11/05/2022   LDLCALC 43 05/07/2022   ALT 35 05/07/2022   AST 31 05/07/2022   NA 137 04/29/2023   K 4.5 04/29/2023   CL 102 04/29/2023   CREATININE 1.93 (H) 04/29/2023   BUN 43 (H) 04/29/2023   CO2 26 04/29/2023   TSH 2.32 11/05/2022   PSA 3.50 05/07/2022    INR 2.5 04/15/2023   HGBA1C 8.0 (H) 04/29/2023   MICROALBUR 2.6 (H) 04/29/2023    VAS [place] LOWER EXTREMITY ARTERIAL DUPLEX  Result Date: 01/11/2023 LOWER EXTREMITY ARTERIAL DUPLEX STUDY Patient Name:  William [name]  Date of Exam:   01/10/2023 Medical Rec #: [id number]    Accession #:    [id number] Date of Birth: 05/20/1951   Patient Gender: M Patient Age:   78 years Exam Location:  [facility] Procedure:      VAS [place] LOWER EXTREMITY ARTERIAL DUPLEX Referring Phys: [facility] ARIDA --------------------------------------------------------------------------------  Indications: Peripheral artery disease, and Patient reports symptoms of              nighttime cramping in his legs which wakes him up but otherwise              denies signficant claudication or rest pain symptoms at this time. High Risk Factors: Hypertension, hyperlipidemia, Diabetes, past history of                    smoking, coronary artery disease.  Vascular Interventions: 05/2016-right distal SFA angioplasty. S/P                         transmetatarsal amputation of foot, right. Current ABI:            Rt: 0.90                         Lt: 0.81 Comparison Study: Previous arterial duplex 01/29/22 showed proximal right SFA                   50-74% stenosis Performing Technologist: Olegario Shearer RVT  Examination Guidelines: A complete evaluation includes B-mode imaging, spectral Doppler, color Doppler, and power Doppler as needed of all accessible portions of each vessel. Bilateral testing is considered an integral part of a complete examination. Limited examinations for reoccurring indications may be performed as noted.  +----------+--------+-----+---------------+----------+--------+ RIGHT     PSV cm/sRatioStenosis       Waveform  Comments +----------+--------+-----+---------------+----------+--------+ CFA Prox  189          30-49% stenosistriphasic          +----------+--------+-----+---------------+----------+--------+ DFA       86  Wt 195 lb 9.6 oz (88.7 kg)   SpO2 91%   BMI 32.55 kg/m   BP Readings from Last 3 Encounters:  04/29/23 116/68  01/28/23 124/84  01/22/23 (!) 112/54    Wt Readings from Last 3 Encounters:  04/29/23 195 lb 9.6 oz (88.7 kg)  01/28/23 186 lb (84.4 kg)  01/22/23 186 lb (84.4 kg)    Physical Exam  Lab Results  Component Value Date   WBC 11.7 (H) 04/29/2023   HGB 13.0 04/29/2023   HCT 40.3 04/29/2023   PLT 166.0 04/29/2023   GLUCOSE 374 (H) 04/29/2023   CHOL 126 11/05/2022   TRIG 290.0 (H) 11/05/2022   HDL 32.10 (L) 11/05/2022   LDLDIRECT 54.0 11/05/2022   LDLCALC 43 05/07/2022   ALT 35 05/07/2022   AST 31 05/07/2022   NA 137 04/29/2023   K 4.5 04/29/2023   CL 102 04/29/2023   CREATININE 1.93 (H) 04/29/2023   BUN 43 (H) 04/29/2023   CO2 26 04/29/2023   TSH 2.32 11/05/2022   PSA 3.50 05/07/2022    INR 2.5 04/15/2023   HGBA1C 8.0 (H) 04/29/2023   MICROALBUR 2.6 (H) 04/29/2023    VAS [place] LOWER EXTREMITY ARTERIAL DUPLEX  Result Date: 01/11/2023 LOWER EXTREMITY ARTERIAL DUPLEX STUDY Patient Name:  William [name]  Date of Exam:   01/10/2023 Medical Rec #: [id number]    Accession #:    [id number] Date of Birth: 05/20/1951   Patient Gender: M Patient Age:   78 years Exam Location:  [facility] Procedure:      VAS [place] LOWER EXTREMITY ARTERIAL DUPLEX Referring Phys: [facility] ARIDA --------------------------------------------------------------------------------  Indications: Peripheral artery disease, and Patient reports symptoms of              nighttime cramping in his legs which wakes him up but otherwise              denies signficant claudication or rest pain symptoms at this time. High Risk Factors: Hypertension, hyperlipidemia, Diabetes, past history of                    smoking, coronary artery disease.  Vascular Interventions: 05/2016-right distal SFA angioplasty. S/P                         transmetatarsal amputation of foot, right. Current ABI:            Rt: 0.90                         Lt: 0.81 Comparison Study: Previous arterial duplex 01/29/22 showed proximal right SFA                   50-74% stenosis Performing Technologist: Olegario Shearer RVT  Examination Guidelines: A complete evaluation includes B-mode imaging, spectral Doppler, color Doppler, and power Doppler as needed of all accessible portions of each vessel. Bilateral testing is considered an integral part of a complete examination. Limited examinations for reoccurring indications may be performed as noted.  +----------+--------+-----+---------------+----------+--------+ RIGHT     PSV cm/sRatioStenosis       Waveform  Comments +----------+--------+-----+---------------+----------+--------+ CFA Prox  189          30-49% stenosistriphasic          +----------+--------+-----+---------------+----------+--------+ DFA       86  Subjective:  Patient ID: William [name], male    DOB: 31-May-1951  Age: 72 y.o. MRN: [id number]  CC: Anemia   HPI William [name] presents for f/up ----   Outpatient Medications Prior to Visit  Medication Sig Dispense Refill   acetaminophen (TYLENOL) 500 MG tablet Take 500 mg by mouth every 6 (six) hours as needed for moderate pain.     albuterol (PROVENTIL HFA;VENTOLIN HFA) 108 (90 Base) MCG/ACT inhaler Inhale 2 puffs into the lungs every 6 (six) hours as needed for wheezing or shortness of breath. 1 Inhaler 0   carvedilol (COREG) 3.125 MG tablet TAKE 1 TABLET(3.125 MG) BY MOUTH TWICE DAILY WITH A MEAL 180 tablet 1   Castellani Paint 1.5 % LIQD Apply 1 application  topically 2 (two) times daily. 29.57 mL 1   Continuous Glucose Sensor (FREESTYLE LIBRE 2 SENSOR) MISC APPLY A NEW SENSOR EVERY 14 DAYS 2 each 5   dapagliflozin propanediol (FARXIGA) 10 MG TABS tablet TAKE 1 TABLET(10 MG) BY MOUTH DAILY 90 tablet 1   gabapentin (NEURONTIN) 300 MG capsule TAKE 1 CAPSULE(300 MG) BY MOUTH TWICE DAILY 180 capsule 1   Glucagon (GVOKE HYPOPEN 2-PACK) 1 MG/0.2ML SOAJ Inject 1 Act into the skin daily as needed. 2 mL 5   HUMALOG KWIKPEN 100 UNIT/ML KwikPen INJECT 10 UNITS UNDER THE SKIN THREE TIMES DAILY AS DIRECTED BY SLIDING SCALE (Patient taking differently: Inject 10-45 Units into the skin 3 (three) times daily. Pt takes with meals. Depends on sugar) 27 mL 1   pantoprazole (PROTONIX) 40 MG tablet TAKE 1 TABLET(40 MG) BY MOUTH TWICE DAILY BEFORE A MEAL 180 tablet 0   pramipexole (MIRAPEX) 0.125 MG tablet TAKE 1 TABLET(0.125 MG) BY MOUTH AT BEDTIME 90 tablet 1   rosuvastatin (CRESTOR) 40 MG tablet TAKE 1 TABLET BY MOUTH EVERY DAY 90 tablet 1   silver sulfADIAZINE (SILVADENE) 1 % cream Apply 1 Application topically daily. 400 g 2   torsemide (DEMADEX) 20 MG tablet TAKE 1 TABLET(20 MG) BY MOUTH 3 TIMES A WEEK Monday, Wednesday and Friday 38 tablet 1   VASCEPA 1 g capsule TAKE 2 CAPSULES(2 GRAMS) BY  MOUTH TWICE DAILY 360 capsule 1   warfarin (COUMADIN) 5 MG tablet Take 1 to 1 &1/2  by mouth daily as directed by the coumadin clinic (Patient taking differently: Take 1 to 1 &1/2  by mouth daily as directed by the coumadin clinic Take 7.5 mg by mouth daily (1.5 tablet of 5 mg) on Tues, Thurs, Sat and Sun. Takes 5 mg (one 5 mg tablet) on Mon, Wed and Fri.) 120 tablet 0   warfarin (COUMADIN) 5 MG tablet Take 5 mg by mouth daily.     aspirin EC 81 MG tablet Take 1 tablet (81 mg total) by mouth daily. 30 tablet 3   Continuous Blood Gluc Receiver (FREESTYLE LIBRE 2 READER) DEVI 1 Act by Does not apply route daily. 2 each 5   insulin glargine (LANTUS SOLOSTAR) 100 UNIT/ML Solostar Pen ADMINISTER 55 UNITS UNDER THE SKIN EVERY EVENING 51 mL 0   Insulin Pen Needle 31G X 8 MM MISC 1 each by Does not apply route 4 (four) times daily. 300 each 2   No facility-administered medications prior to visit.    ROS Review of Systems  Objective:  BP 116/68 (BP Location: Left Arm, Patient Position: Sitting, Cuff Size: Normal)   Pulse 93   Temp 98.4 F (36.9 C) (Oral)   Ht 5\' 5"  (1.651 m)  Wt 195 lb 9.6 oz (88.7 kg)   SpO2 91%   BMI 32.55 kg/m   BP Readings from Last 3 Encounters:  04/29/23 116/68  01/28/23 124/84  01/22/23 (!) 112/54    Wt Readings from Last 3 Encounters:  04/29/23 195 lb 9.6 oz (88.7 kg)  01/28/23 186 lb (84.4 kg)  01/22/23 186 lb (84.4 kg)    Physical Exam  Lab Results  Component Value Date   WBC 11.7 (H) 04/29/2023   HGB 13.0 04/29/2023   HCT 40.3 04/29/2023   PLT 166.0 04/29/2023   GLUCOSE 374 (H) 04/29/2023   CHOL 126 11/05/2022   TRIG 290.0 (H) 11/05/2022   HDL 32.10 (L) 11/05/2022   LDLDIRECT 54.0 11/05/2022   LDLCALC 43 05/07/2022   ALT 35 05/07/2022   AST 31 05/07/2022   NA 137 04/29/2023   K 4.5 04/29/2023   CL 102 04/29/2023   CREATININE 1.93 (H) 04/29/2023   BUN 43 (H) 04/29/2023   CO2 26 04/29/2023   TSH 2.32 11/05/2022   PSA 3.50 05/07/2022    INR 2.5 04/15/2023   HGBA1C 8.0 (H) 04/29/2023   MICROALBUR 2.6 (H) 04/29/2023    VAS [place] LOWER EXTREMITY ARTERIAL DUPLEX  Result Date: 01/11/2023 LOWER EXTREMITY ARTERIAL DUPLEX STUDY Patient Name:  William [name]  Date of Exam:   01/10/2023 Medical Rec #: [id number]    Accession #:    [id number] Date of Birth: 05/20/1951   Patient Gender: M Patient Age:   78 years Exam Location:  [facility] Procedure:      VAS [place] LOWER EXTREMITY ARTERIAL DUPLEX Referring Phys: [facility] ARIDA --------------------------------------------------------------------------------  Indications: Peripheral artery disease, and Patient reports symptoms of              nighttime cramping in his legs which wakes him up but otherwise              denies signficant claudication or rest pain symptoms at this time. High Risk Factors: Hypertension, hyperlipidemia, Diabetes, past history of                    smoking, coronary artery disease.  Vascular Interventions: 05/2016-right distal SFA angioplasty. S/P                         transmetatarsal amputation of foot, right. Current ABI:            Rt: 0.90                         Lt: 0.81 Comparison Study: Previous arterial duplex 01/29/22 showed proximal right SFA                   50-74% stenosis Performing Technologist: Olegario Shearer RVT  Examination Guidelines: A complete evaluation includes B-mode imaging, spectral Doppler, color Doppler, and power Doppler as needed of all accessible portions of each vessel. Bilateral testing is considered an integral part of a complete examination. Limited examinations for reoccurring indications may be performed as noted.  +----------+--------+-----+---------------+----------+--------+ RIGHT     PSV cm/sRatioStenosis       Waveform  Comments +----------+--------+-----+---------------+----------+--------+ CFA Prox  189          30-49% stenosistriphasic          +----------+--------+-----+---------------+----------+--------+ DFA       86

## 2023-05-01 ENCOUNTER — Other Ambulatory Visit: Payer: Self-pay | Admitting: Internal Medicine

## 2023-05-01 DIAGNOSIS — E119 Type 2 diabetes mellitus without complications: Secondary | ICD-10-CM

## 2023-05-02 LAB — VITAMIN B1: Vitamin B1 (Thiamine): 25 nmol/L (ref 8–30)

## 2023-05-02 LAB — RETICULOCYTES
ABS Retic: 99440 {cells}/uL — ABNORMAL HIGH (ref 25000–90000)
Retic Ct Pct: 2.2 %

## 2023-05-02 NOTE — Progress Notes (Signed)
[facility] - Clinic Note  05/06/2023    CHIEF COMPLAINT Patient presents for Retina Follow Up  HISTORY OF PRESENT ILLNESS: William [name] is a 72 y.o. male who presents to the clinic today for:   HPI     Retina Follow Up   Patient presents with  Diabetic Retinopathy.  In both eyes.  Severity is moderate.  Duration of 5 weeks.  Since onset it is stable.  I, the attending physician,  performed the HPI with the patient and updated documentation appropriately.        Comments   Patient feels the vision is good. He is using AT's OU PRN. His blood sugar was 205.      Last edited by Rennis Chris, MD on 05/06/2023  8:25 AM.    Pt states vision is the same, he is having a CT scan to see if he has a kidney stone or kidney cancer bc something was found in his urine  Referring physician: [name], MD [address] [place],  [place] [address]  HISTORICAL INFORMATION:  Selected notes from the MEDICAL RECORD NUMBER Referred by Dr. Alben Spittle for eval of DME OU LEE:  Ocular Hx- PMH-    CURRENT MEDICATIONS: No current outpatient medications on file. (Ophthalmic Drugs)   No current facility-administered medications for this visit. (Ophthalmic Drugs)   Current Outpatient Medications (Other)  Medication Sig   acetaminophen (TYLENOL) 500 MG tablet Take 500 mg by mouth every 6 (six) hours as needed for moderate pain.   carvedilol (COREG) 3.125 MG tablet TAKE 1 TABLET(3.125 MG) BY MOUTH TWICE DAILY WITH A MEAL   Castellani Paint 1.5 % LIQD Apply 1 application  topically 2 (two) times daily.   Continuous Glucose Receiver (DEXCOM G7 RECEIVER) DEVI 1 Act by Does not apply route daily.   Continuous Glucose Sensor (DEXCOM G7 SENSOR) MISC 1 Act by Does not apply route daily.   Continuous Glucose Sensor (FREESTYLE LIBRE 2 SENSOR) MISC APPLY A NEW SENSOR EVERY 14 DAYS   dapagliflozin propanediol (FARXIGA) 10 MG TABS tablet TAKE 1 TABLET(10 MG) BY MOUTH DAILY   gabapentin  (NEURONTIN) 300 MG capsule TAKE 1 CAPSULE(300 MG) BY MOUTH TWICE DAILY   Glucagon (GVOKE HYPOPEN 2-PACK) 1 MG/0.2ML SOAJ Inject 1 Act into the skin daily as needed.   HUMALOG KWIKPEN 100 UNIT/ML KwikPen INJECT 10 UNITS UNDER THE SKIN THREE TIMES DAILY AS DIRECTED BY SLIDING SCALE (Patient taking differently: Inject 10-45 Units into the skin 3 (three) times daily. Pt takes with meals. Depends on sugar)   Insulin Pen Needle 31G X 8 MM MISC 1 each by Does not apply route 4 (four) times daily.   LANTUS SOLOSTAR 100 UNIT/ML Solostar Pen INJECT 55 UNITS UNDER THE SKIN EVERY EVENING   pantoprazole (PROTONIX) 40 MG tablet TAKE 1 TABLET(40 MG) BY MOUTH TWICE DAILY BEFORE A MEAL   pramipexole (MIRAPEX) 0.125 MG tablet TAKE 1 TABLET(0.125 MG) BY MOUTH AT BEDTIME   rosuvastatin (CRESTOR) 40 MG tablet TAKE 1 TABLET BY MOUTH EVERY DAY   silver sulfADIAZINE (SILVADENE) 1 % cream Apply 1 Application topically daily.   torsemide (DEMADEX) 20 MG tablet TAKE 1 TABLET(20 MG) BY MOUTH 3 TIMES A WEEK Monday, Wednesday and Friday   VASCEPA 1 g capsule TAKE 2 CAPSULES(2 GRAMS) BY MOUTH TWICE DAILY   warfarin (COUMADIN) 5 MG tablet Take 1 to 1 &1/2  by mouth daily as directed by the coumadin clinic (Patient taking differently: Take 1 to  1 &1/2  by mouth daily as directed by the coumadin clinic Take 7.5 mg by mouth daily (1.5 tablet of 5 mg) on Tues, Thurs, Sat and Sun. Takes 5 mg (one 5 mg tablet) on Mon, Wed and Fri.)   warfarin (COUMADIN) 5 MG tablet Take 5 mg by mouth daily.   No current facility-administered medications for this visit. (Other)   REVIEW OF SYSTEMS: ROS   Positive for: Gastrointestinal, Genitourinary, Endocrine, Eyes Negative for: Constitutional, Neurological, Skin, Musculoskeletal, HENT, Cardiovascular, Respiratory, Psychiatric, Allergic/Imm, Heme/Lymph Last edited by Charlette Caffey, COT on 05/06/2023  7:41 AM.       ALLERGIES Allergies  Allergen Reactions   Morphine And Codeine  Shortness Of Breath and Other (See Comments)    UNSPECIFIED REACTION "Pt said it was too much"    Latex Rash   PAST MEDICAL HISTORY Past Medical History:  Diagnosis Date   AKI (acute kidney injury) ([name])    With STEMI in 2017   Chronic systolic CHF (congestive heart failure) ([name])    Depression    Diabetes mellitus without complication ([name])    Diabetic retinopathy ([name])    GERD (gastroesophageal reflux disease)    Hx of adenomatous colonic polyps 04/07/2018   Hyperlipidemia    Hypertension    Hypertensive retinopathy    Paroxysmal atrial fibrillation ([name])    Peripheral vascular disease ([name])    Pneumonia    Seizures ([name])    hx of as a child   STEMI (ST elevation myocardial infarction) ([name]) 2017   Past Surgical History:  Procedure Laterality Date   ABDOMINAL AORTOGRAM W/LOWER EXTREMITY N/A 09/19/2016   Procedure: Abdominal Aortogram w/Lower Extremity;  Surgeon: Iran Ouch, MD;  Location: [facility] INVASIVE CV LAB;  Service: Cardiovascular;  Laterality: N/A;   AMPUTATION Right 03/23/2016   Procedure: 1st and 2nd Ray Amputation Right Foot;  Surgeon: Nadara Mustard, MD;  Location[facility] OR;  Service: Orthopedics;  Laterality: Right;   AMPUTATION Right 06/21/2016   Procedure: RIGHT TRANSMETATARSAL AMPUTATION;  Surgeon: Nadara Mustard, MD;  Location: [facility] OR;  Service: Orthopedics;  Laterality: Right;   CARDIAC CATHETERIZATION N/A 03/13/2016   Procedure: Right/Left Heart Cath and Coronary Angiography;  Surgeon: Tonny Bollman, MD;  Location: [facility] INVASIVE CV LAB;  Service: Cardiovascular;  Laterality: N/A;   CARDIAC CATHETERIZATION N/A 03/13/2016   Procedure: IABP Insertion;  Surgeon: Tonny Bollman, MD;  Location: [facility] INVASIVE CV LAB;  Service: Cardiovascular;  Laterality: N/A;   CATARACT EXTRACTION     CATARACT EXTRACTION, BILATERAL     CERVICAL FUSION  1982, 1992   has had 3 neck surgeries from breaking his neck   CIRCUMCISION N/A 11/07/2021   Procedure: CIRCUMCISION ADULT;  Surgeon:  Despina Arias, MD;  Location: [facility] ORS;  Service: Urology;  Laterality: N/A;   CORONARY ARTERY BYPASS GRAFT N/A 03/13/2016   Procedure: CORONARY ARTERY BYPASS GRAFTING (CABG) x 1 (SVG to OM) with EVH from LEFT GREATER SAPHENOUS VEIN;  Surgeon: Kerin Perna, MD;  Location: [facility] OR;  Service: Open Heart Surgery;  Laterality: N/A;   EYE SURGERY     LOWER EXTREMITY ANGIOGRAM  05/02/2016   Procedure: Lower Extremity Angiogram;  Surgeon: Iran Ouch, MD;  Location: [facility] INVASIVE CV LAB;  Service: Cardiovascular;;  Limited left femoral runoff right femoral runoff   PERIPHERAL VASCULAR CATHETERIZATION N/A 05/02/2016   Procedure: Abdominal Aortogram;  Surgeon: Iran Ouch, MD;  Location: [facility] INVASIVE CV LAB;  Service: Cardiovascular;  Laterality: N/A;  PERIPHERAL VASCULAR CATHETERIZATION Right 05/02/2016   Procedure: Peripheral Vascular Balloon Angioplasty;  Surgeon: Iran Ouch, MD;  Location: [facility] INVASIVE CV LAB;  Service: Cardiovascular;  Laterality: Right;  SFA   TEE WITHOUT CARDIOVERSION N/A 03/13/2016   Procedure: TRANSESOPHAGEAL ECHOCARDIOGRAM (TEE);  Surgeon: Kerin Perna, MD;  Location: [facility] OR;  Service: Open Heart Surgery;  Laterality: N/A;   VSD REPAIR N/A 03/13/2016   Procedure: VENTRICULAR SEPTAL DEFECT (VSD) REPAIR;  Surgeon: Kerin Perna, MD;  Location: [facility] OR;  Service: Open Heart Surgery;  Laterality: N/A;   FAMILY HISTORY Family History  Problem Relation Age of Onset   Diabetes Maternal Grandmother    Diabetes Mother    Aneurysm Mother    Peripheral Artery Disease Mother    Coronary artery disease Mother    Peptic Ulcer Father    Retinoblastoma Daughter    Colon cancer Neg Hx    Rectal cancer Neg Hx    SOCIAL HISTORY Social History   Tobacco Use   Smoking status: Former    Current packs/day: 0.00    Types: Cigarettes    Quit date: 11/20/2015    Years since quitting: 7.4   Smokeless tobacco: Never   Tobacco comments:    quit 2018  Vaping Use   Vaping  status: Never Used  Substance Use Topics   Alcohol use: No   Drug use: No       OPHTHALMIC EXAM: Base Eye Exam     Visual Acuity (Snellen - Linear)       Right Left   Dist [place] 20/25 20/25   Dist ph [place] NI NI         Tonometry (Tonopen, 7:45 AM)       Right Left   Pressure 12 14         Pupils       Dark Light Shape React APD   Right 2 1 Round Brisk None   Left 2 1 Round Brisk None         Visual Fields       Left Right    Full Full         Extraocular Movement       Right Left    Full, Ortho Full, Ortho         Neuro/Psych     Oriented x3: Yes   Mood/Affect: Normal         Dilation     Both eyes: 1.0% Mydriacyl, 2.5% Phenylephrine @ 7:42 AM           Slit Lamp and Fundus Exam     Slit Lamp Exam       Right Left   Lids/Lashes Dermatochalasis - upper lid, mild MGD Dermatochalasis - upper lid, mild MGD   Conjunctiva/Sclera White and quiet White and quiet   Cornea 1+fine PEE, well healed cataract wound, mild tear film debris Trace PEE, mild tear film debris, well healed cataract wound   Anterior Chamber Deep and quiet Deep and quiet   Iris Round and dilated, No NVI Round and dilated, No NVI   Lens PC IOL in good position PC IOL in good position   Anterior Vitreous Vitreous syneresis, vitreous condensations Vitreous syneresis, Posterior vitreous detachment, vitreous condensations         Fundus Exam       Right Left   Disc trace Pallor, Sharp rim mild Pallor, Sharp rim, mild tilt   C/D Ratio 0.3 0.3   Macula Flat,  good foveal reflex, scatted Microaneurysms, persistent cystic changes -- slightly improved superiorly, possible targets for focal laser superiorly Flat, good foveal reflex, scattered MA -- improved, interval improvement in cystic changes temporal mac, good focal laser changes   Vessels attenuated, mild tortuosity attenuated, Tortuous   Periphery Attached, +MA greatest posteriorly Attached, scattered MA greatest posteriorly            IMAGING AND PROCEDURES  Imaging and Procedures for 05/06/2023  OCT, Retina - OU - Both Eyes       Right Eye Quality was good. Central Foveal Thickness: 241. Progression has improved. Findings include no SRF, abnormal foveal contour, intraretinal hyper-reflective material, intraretinal fluid, vitreomacular adhesion (Mild interval improvement in IRF/cystic changes superior fovea and mac ).   Left Eye Quality was good. Central Foveal Thickness: 245. Progression has improved. Findings include normal foveal contour, no SRF, intraretinal hyper-reflective material, intraretinal fluid (interval improvement in IRF/ cystic changes temporal fovea and macula).   Notes *Images captured and stored on drive  Diagnosis / Impression:  +DME OU OD: Mild interval improvement in IRF/cystic changes superior fovea and mac  OS: interval improvement in IRF/ cystic changes temporal fovea and macula  Clinical management:  See below  Abbreviations: NFP - Normal foveal profile. CME - cystoid macular edema. PED - pigment epithelial detachment. IRF - intraretinal fluid. SRF - subretinal fluid. EZ - ellipsoid zone. ERM - epiretinal membrane. ORA - outer retinal atrophy. ORT - outer retinal tubulation. SRHM - subretinal hyper-reflective material. IRHM - intraretinal hyper-reflective material      Intravitreal Injection, Pharmacologic Agent - OD - Right Eye       Time Out 05/06/2023. 8:08 AM. Confirmed correct patient, procedure, site, and patient consented.   Anesthesia Topical anesthesia was used. Anesthetic medications included Lidocaine 2%, Proparacaine 0.5%.   Procedure Preparation included 5% betadine to ocular surface, eyelid speculum. A (32g) needle was used.   Injection: 2 mg aflibercept 2 MG/0.05ML   Route: Intravitreal, Site: Right Eye   NDC: L6038910, Lot: [id number], Expiration date: 03/01/2024, Waste: 0 mL   Post-op Post injection exam found visual acuity of at least  counting fingers. The patient tolerated the procedure well. There were no complications. The patient received written and verbal post procedure care education. Post injection medications were not given.      Intravitreal Injection, Pharmacologic Agent - OS - Left Eye       Time Out 05/06/2023. 8:08 AM. Confirmed correct patient, procedure, site, and patient consented.   Anesthesia Topical anesthesia was used. Anesthetic medications included Lidocaine 2%, Proparacaine 0.5%.   Procedure Preparation included 5% betadine to ocular surface, eyelid speculum. A (32g) needle was used.   Injection: 2 mg aflibercept 2 MG/0.05ML   Route: Intravitreal, Site: Left Eye   NDC: L6038910, Lot: [id number], Expiration date: 05/31/2024, Waste: 0 mL   Post-op Post injection exam found visual acuity of at least counting fingers. The patient tolerated the procedure well. There were no complications. The patient received written and verbal post procedure care education. Post injection medications were not given.            ASSESSMENT/PLAN:    ICD-10-CM   1. Moderate nonproliferative diabetic retinopathy of both eyes with macular edema associated with type 2 diabetes mellitus ([name])  E11.3313 OCT, Retina - OU - Both Eyes    Intravitreal Injection, Pharmacologic Agent - OD - Right Eye    Intravitreal Injection, Pharmacologic Agent - OS - Left Eye    aflibercept (EYLEA) SOLN  2 mg    aflibercept (EYLEA) SOLN 2 mg    2. Current use of insulin ([name])  Z79.4     3. Essential hypertension  I10     4. Hypertensive retinopathy of both eyes  H35.033     5. Pseudophakia, both eyes  Z96.1      1,2. Moderate non-proliferative diabetic retinopathy, both eyes  - last A1c was 8.0 on 11.06.23  - s/p IVA OD #1 (11.14.22), #2 (12.12.22), #3 (01.09.23) -- IVA resistance - s/p IVA OS #1 (10.17.22), #2 (11.14.22), #3 (12.12.22), #4 (01.09.23), #5 (03.15.23), #6 (04.12.23) -- IVA resistance - s/p IVE OD #1  (03.15.23), #2 (04.12.23), #3 (05.10.23), #4 (06.07.23), #5 (07.05.23), #6 (08.02.23), #7 (08.31.23), #8 (09.28.23), #9 (10.30.23), #10 (11.28.23), #11 (12.28.23), #12 (01.29.24), #13 (02.20.24), #14 (03.25.24), #15 (04.22.24), #16 (05.20.24), #17 (06.17.24), #18 (07.22.24), #19 (08.26.24), #20 (09.30.24) - s/p IVE OS #1 (05.10.23), #2 (06.07.23), #3 (07.05.23), #4 (08.02.23), #5 (08.31.23), #6 (09.28.23), #7 (10.30.23), #8 (11.28.23), #9 (12.28.23), #10 (01.29.24), #11 (02.20..24), #12 (03.25.24), #13 (04.22.24), #14 (05.20.24), #15 (06.17.24), #16 (07.22.24), #17 (09.30.24) - s/p focal laser OS (06.25.24) - exam shows scattered MA, DBH OU - FA (10.17.22) shows late leaking MA OU, no NV OU - OCT shows   OD: Mild interval improvement in IRF/cystic changes superior fovea and mac; OS: interval improvement in IRF/ cystic changes temporal fovea and macula at 5 wks - BCVA OD: 20/25 OU - recommend IVE OD #21 and IVE OS #18 today, 11.04.24 w/ f/u extended to 5-6 wks - pt wishes to proceed with injection OU - RBA of procedure discussed, questions answered - IVE informed consent obtained and signed, 11.04.24 (OU) - see procedure note - follow up 5-6 weeks, DFE, OCT, possible injections  3,4. Hypertensive retinopathy OU - discussed importance of tight BP control and possibility of elevated BP blunting efficacy of IVE  - monitor  5. Pseudophakia OU  - s/p CE/IOL OU  - IOL in good position, doing well  - monitor  Ophthalmic Meds Ordered this visit:  Meds ordered this encounter  Medications   aflibercept (EYLEA) SOLN 2 mg   aflibercept (EYLEA) SOLN 2 mg     Return for f/u 5-6 weeks, NPDR OU, DFE, OCT.  There are no Patient Instructions on file for this visit.  Explained the diagnoses, plan, and follow up with the patient and they expressed understanding.  Patient expressed understanding of the importance of proper follow up care.   This document serves as a record of services personally  performed by Karie Chimera, MD, PhD. It was created on their behalf by Annalee Genta, COMT. The creation of this record is the provider's dictation and/or activities during the visit.  Electronically signed by: Annalee Genta, COMT 05/06/23 8:26 AM  This document serves as a record of services personally performed by Karie Chimera, MD, PhD. It was created on their behalf by Glee Arvin. Manson Passey, OA an ophthalmic technician. The creation of this record is the provider's dictation and/or activities during the visit.    Electronically signed by: Glee Arvin. Manson Passey, OA 05/06/23 8:26 AM  Karie Chimera, M.D., Ph.D. Diseases & Surgery of the Retina and Vitreous [facility]  I have reviewed the above documentation for accuracy and completeness, and I agree with the above. Karie Chimera, M.D., Ph.D. 05/06/23 8:27 AM  Abbreviations: M myopia (nearsighted); A astigmatism; H hyperopia (farsighted); P presbyopia; Mrx spectacle prescription;  CTL contact lenses; OD right  eye; OS left eye; OU both eyes  XT exotropia; ET esotropia; PEK punctate epithelial keratitis; PEE punctate epithelial erosions; DES dry eye syndrome; MGD meibomian gland dysfunction; ATs artificial tears; PFAT's preservative free artificial tears; NSC nuclear sclerotic cataract; PSC posterior subcapsular cataract; ERM epi-retinal membrane; PVD posterior vitreous detachment; RD retinal detachment; DM diabetes mellitus; DR diabetic retinopathy; NPDR non-proliferative diabetic retinopathy; PDR proliferative diabetic retinopathy; CSME clinically significant macular edema; DME diabetic macular edema; dbh dot blot hemorrhages; CWS cotton wool spot; POAG primary open angle glaucoma; C/D cup-to-disc ratio; HVF humphrey visual field; GVF goldmann visual field; OCT optical coherence tomography; IOP intraocular pressure; BRVO Branch retinal vein occlusion; CRVO central retinal vein occlusion; CRAO central retinal artery occlusion; BRAO  branch retinal artery occlusion; RT retinal tear; SB scleral buckle; PPV pars plana vitrectomy; VH Vitreous hemorrhage; PRP panretinal laser photocoagulation; IVK intravitreal kenalog; VMT vitreomacular traction; MH Macular hole;  NVD neovascularization of the disc; NVE neovascularization elsewhere; AREDS age related eye disease study; ARMD age related macular degeneration; POAG primary open angle glaucoma; EBMD epithelial/anterior basement membrane dystrophy; ACIOL anterior chamber intraocular lens; IOL intraocular lens; PCIOL posterior chamber intraocular lens; Phaco/IOL phacoemulsification with intraocular lens placement; PRK photorefractive keratectomy; LASIK laser assisted in situ keratomileusis; HTN hypertension; DM diabetes mellitus; COPD chronic obstructive pulmonary disease

## 2023-05-06 ENCOUNTER — Encounter (INDEPENDENT_AMBULATORY_CARE_PROVIDER_SITE_OTHER): Payer: Self-pay | Admitting: Ophthalmology

## 2023-05-06 ENCOUNTER — Ambulatory Visit (INDEPENDENT_AMBULATORY_CARE_PROVIDER_SITE_OTHER): Payer: 59 | Admitting: Ophthalmology

## 2023-05-06 ENCOUNTER — Ambulatory Visit: Payer: 59 | Admitting: Internal Medicine

## 2023-05-06 DIAGNOSIS — H35033 Hypertensive retinopathy, bilateral: Secondary | ICD-10-CM

## 2023-05-06 DIAGNOSIS — I10 Essential (primary) hypertension: Secondary | ICD-10-CM | POA: Diagnosis not present

## 2023-05-06 DIAGNOSIS — Z794 Long term (current) use of insulin: Secondary | ICD-10-CM | POA: Diagnosis not present

## 2023-05-06 DIAGNOSIS — E113313 Type 2 diabetes mellitus with moderate nonproliferative diabetic retinopathy with macular edema, bilateral: Secondary | ICD-10-CM

## 2023-05-06 DIAGNOSIS — Z961 Presence of intraocular lens: Secondary | ICD-10-CM | POA: Diagnosis not present

## 2023-05-06 MED ORDER — AFLIBERCEPT 2MG/0.05ML IZ SOLN FOR KALEIDOSCOPE
2.0000 mg | INTRAVITREAL | Status: AC | PRN
  Administered 2023-05-06: 2 mg via INTRAVITREAL

## 2023-05-08 DIAGNOSIS — N183 Chronic kidney disease, stage 3 unspecified: Secondary | ICD-10-CM | POA: Diagnosis not present

## 2023-05-13 ENCOUNTER — Other Ambulatory Visit: Payer: Self-pay | Admitting: Internal Medicine

## 2023-05-13 ENCOUNTER — Ambulatory Visit
Admission: RE | Admit: 2023-05-13 | Discharge: 2023-05-13 | Disposition: A | Discharge status: Home / Self Care | Payer: 59 | Source: Ambulatory Visit | Attending: Internal Medicine | Admitting: Internal Medicine

## 2023-05-13 DIAGNOSIS — E875 Hyperkalemia: Secondary | ICD-10-CM | POA: Diagnosis not present

## 2023-05-13 DIAGNOSIS — N1832 Chronic kidney disease, stage 3b: Secondary | ICD-10-CM | POA: Diagnosis not present

## 2023-05-13 DIAGNOSIS — R3121 Asymptomatic microscopic hematuria: Secondary | ICD-10-CM

## 2023-05-13 DIAGNOSIS — E1122 Type 2 diabetes mellitus with diabetic chronic kidney disease: Secondary | ICD-10-CM | POA: Diagnosis not present

## 2023-05-13 DIAGNOSIS — R161 Splenomegaly, not elsewhere classified: Secondary | ICD-10-CM | POA: Diagnosis not present

## 2023-05-13 DIAGNOSIS — E1152 Type 2 diabetes mellitus with diabetic peripheral angiopathy with gangrene: Secondary | ICD-10-CM

## 2023-05-13 DIAGNOSIS — R319 Hematuria, unspecified: Secondary | ICD-10-CM | POA: Diagnosis not present

## 2023-05-13 DIAGNOSIS — I129 Hypertensive chronic kidney disease with stage 1 through stage 4 chronic kidney disease, or unspecified chronic kidney disease: Secondary | ICD-10-CM | POA: Diagnosis not present

## 2023-05-13 DIAGNOSIS — D631 Anemia in chronic kidney disease: Secondary | ICD-10-CM | POA: Diagnosis not present

## 2023-05-13 DIAGNOSIS — K8020 Calculus of gallbladder without cholecystitis without obstruction: Secondary | ICD-10-CM | POA: Diagnosis not present

## 2023-05-13 DIAGNOSIS — R1084 Generalized abdominal pain: Secondary | ICD-10-CM | POA: Diagnosis not present

## 2023-05-15 ENCOUNTER — Other Ambulatory Visit: Payer: Self-pay

## 2023-05-15 ENCOUNTER — Telehealth: Payer: Self-pay | Admitting: Internal Medicine

## 2023-05-15 DIAGNOSIS — I5022 Chronic systolic (congestive) heart failure: Secondary | ICD-10-CM

## 2023-05-15 MED ORDER — TORSEMIDE 20 MG PO TABS: 20.0000 mg | ORAL_TABLET | ORAL | 0 refills | Status: DC

## 2023-05-15 NOTE — Telephone Encounter (Signed)
 Medication refill sent to Dr. Yetta Barre

## 2023-05-15 NOTE — Telephone Encounter (Signed)
Daughter called stating the kidney Doctor mention about putting the pt on Ozempic because of his glucose levels. Please advise.

## 2023-05-15 NOTE — Telephone Encounter (Signed)
Patients daughter states that they have always had an issue with his glucose levels being too high no matter what they try. Please advise the recommendations below.

## 2023-05-15 NOTE — Telephone Encounter (Signed)
Prescription Request  05/15/2023  LOV: 04/29/2023  What is the name of the medication or equipment? torsemide (DEMADEX) 20 MG tablet   Have you contacted your pharmacy to request a refill? Yes   Which pharmacy would you like this sent to?  [id number] [place], [place] - [address] AT [facility] OF [address] & [address] [place] [address] Phone: [phone] Fax: [phone]    Patient notified that their request is being sent to the clinical staff for review and that they should receive a response within 2 business days.   Please advise at Mobile [phone] (mobile)

## 2023-05-16 ENCOUNTER — Other Ambulatory Visit: Payer: Self-pay | Admitting: Internal Medicine

## 2023-05-16 DIAGNOSIS — Z794 Long term (current) use of insulin: Secondary | ICD-10-CM

## 2023-05-16 DIAGNOSIS — E1122 Type 2 diabetes mellitus with diabetic chronic kidney disease: Secondary | ICD-10-CM

## 2023-05-16 DIAGNOSIS — N1832 Chronic kidney disease, stage 3b: Secondary | ICD-10-CM

## 2023-05-16 DIAGNOSIS — I5022 Chronic systolic (congestive) heart failure: Secondary | ICD-10-CM

## 2023-05-19 ENCOUNTER — Other Ambulatory Visit: Payer: Self-pay | Admitting: Internal Medicine

## 2023-05-19 DIAGNOSIS — R3121 Asymptomatic microscopic hematuria: Secondary | ICD-10-CM

## 2023-05-19 DIAGNOSIS — N12 Tubulo-interstitial nephritis, not specified as acute or chronic: Secondary | ICD-10-CM

## 2023-05-19 MED ORDER — SULFAMETHOXAZOLE-TRIMETHOPRIM 800-160 MG PO TABS: 1.0000 | ORAL_TABLET | Freq: Two times a day (BID) | ORAL | 0 refills | Status: AC

## 2023-05-20 ENCOUNTER — Other Ambulatory Visit: Payer: Self-pay | Admitting: Internal Medicine

## 2023-05-20 DIAGNOSIS — E119 Type 2 diabetes mellitus without complications: Secondary | ICD-10-CM

## 2023-05-20 MED ORDER — LANTUS SOLOSTAR 100 UNIT/ML ~~LOC~~ SOPN: 60.0000 [IU] | PEN_INJECTOR | Freq: Every day | SUBCUTANEOUS | 0 refills | Status: DC

## 2023-05-20 NOTE — Telephone Encounter (Signed)
Patient daughter has been made aware she gave a verbal understanding.

## 2023-05-22 ENCOUNTER — Ambulatory Visit (INDEPENDENT_AMBULATORY_CARE_PROVIDER_SITE_OTHER): Payer: 59

## 2023-05-22 ENCOUNTER — Ambulatory Visit (INDEPENDENT_AMBULATORY_CARE_PROVIDER_SITE_OTHER): Payer: 59 | Admitting: Podiatry

## 2023-05-22 ENCOUNTER — Telehealth: Payer: Self-pay | Admitting: Cardiovascular Disease

## 2023-05-22 ENCOUNTER — Encounter: Payer: Self-pay | Admitting: Podiatry

## 2023-05-22 DIAGNOSIS — M7752 Other enthesopathy of left foot: Secondary | ICD-10-CM

## 2023-05-22 DIAGNOSIS — M779 Enthesopathy, unspecified: Secondary | ICD-10-CM | POA: Diagnosis not present

## 2023-05-22 DIAGNOSIS — I739 Peripheral vascular disease, unspecified: Secondary | ICD-10-CM | POA: Diagnosis not present

## 2023-05-22 NOTE — Telephone Encounter (Signed)
Patient identification verified by 2 forms. Marilynn Rail, RN    Called and spoke to [employer] states:   -patients toe is not looking good again   -patient seen by podiatry today and completed xray   -podiatry recommends patient follow up with vascular -Left pinky toe has a red & irritated sore -has pain that shoots up his leg  Marcelino Duster denies:  -decreased sensation  -red streaks going up leg -swelling  -fever/chills   Patient scheduled for OV 11/21 at 1:30pm  Marcelino Duster agrees with plan, no questions at this time

## 2023-05-22 NOTE — Telephone Encounter (Signed)
Daughter wants a nurse to call her back. She is wanting to see if Dr Kirke Corin wants to do scan of his foot

## 2023-05-23 ENCOUNTER — Other Ambulatory Visit: Payer: Self-pay | Admitting: Internal Medicine

## 2023-05-23 ENCOUNTER — Encounter: Payer: Self-pay | Admitting: Internal Medicine

## 2023-05-23 ENCOUNTER — Ambulatory Visit: Payer: 59 | Attending: Internal Medicine | Admitting: Internal Medicine

## 2023-05-23 VITALS — BP 126/66 | HR 80 | Ht 65.0 in | Wt 195.8 lb

## 2023-05-23 DIAGNOSIS — S91302A Unspecified open wound, left foot, initial encounter: Secondary | ICD-10-CM

## 2023-05-23 DIAGNOSIS — I739 Peripheral vascular disease, unspecified: Secondary | ICD-10-CM

## 2023-05-23 DIAGNOSIS — S91302D Unspecified open wound, left foot, subsequent encounter: Secondary | ICD-10-CM | POA: Diagnosis not present

## 2023-05-23 NOTE — Progress Notes (Signed)
Subjective:   Patient ID: William [name], male   DOB: 72 y.o.   MRN: [id number]   HPI Patient presents with caregiver with a lot of pain between the fourth and fifth toe left foot and states that it did okay when it was treated a number of months ago but has gotten worse and he is due Monday to see the vascular surgeon to evaluate circulation   ROS      Objective:  Physical Exam  Vascular status significantly diminished with no pulse formation DP PT that I was able to note with patient having lost the digits on his right foot.  He has a lesion fourth interspace left there is no erythema around it there is no active drainage it appears to be more inflammatory and also possibly related to circulatory status     Assessment:  Possibility for inflammatory capsulitis with patient in a lot of pain fourth interspace left versus circulatory disease     Plan:  H&P discussed the importance of his evaluation on Monday and I did do a small injection of 1/2 cc dexamethasone half cc Xylocaine to try to give him relief of the inflammatory condition.  I explained that surgery could be possible in the future but not till we know whether or not he has any kind of response to medication and whether or not vascular status is acceptable or can be restored.  Discussed again the vascular disease and the importance to this condition  X-rays indicate no signs of osteolysis or bone infection currently it appears to be soft tissue inflammatory

## 2023-05-23 NOTE — Patient Instructions (Addendum)
Medication Instructions:  NO CHANGES  *If you need a refill on your cardiac medications before your next appointment, please call your pharmacy*  Testing/Procedures: Dr. Rennis Golden has ordered a LEFT lower extremity arterial doppler   Follow-Up: At [facility], you and your health needs are our priority.  As part of our continuing mission to provide you with exceptional heart care, we have created designated Provider Care Teams.  These Care Teams include your primary Cardiologist (physician) and Advanced Practice Providers (APPs -  Physician Assistants and Nurse Practitioners) who all work together to provide you with the care you need, when you need it.  We recommend signing up for the patient portal called "MyChart".  Sign up information is provided on this After Visit Summary.  MyChart is used to connect with patients for Virtual Visits (Telemedicine).  Patients are able to view lab/test results, encounter notes, upcoming appointments, etc.  Non-urgent messages can be sent to your provider as well.   To learn more about what you can do with MyChart, go to [email or web address].    Your next appointment:    Last visit with Dr. Kirke Corin was July 2024.  Appointment may be needed based on results.

## 2023-05-23 NOTE — Progress Notes (Signed)
OFFICE NOTE  Chief Complaint:  Toe wound  Primary Care Physician: [name], MD  HPI:  William [name] is a 72 y.o. male with a past medial history significant for PAD, CAD, chronic systolic heart failure, PAF on warfarin, HTN, HLD, and DM2 with retinopathy. He presents as an acute visit today for apparent nonhealing wound on his left foot.  He contacted the office yesterday and noted that the foot was not looking good.  He was seen by podiatry and they did an x-ray.  Dr. Charlsie Merles noted significantly diminished pulses on the left foot.  There was also a lesion in the fourth interspace which is felt to be more inflammatory and possibly related to circulation.  He suggested this was possibly inflammatory capsulitis.  There was recommended for vascular follow-up and he was placed on the DOD schedule today.  Of note he had arterial Dopplers performed in July which showed mildly reduced ABI in the left side.  There was discussion about possible angiography however it was not performed as that area healed.  He denies any symptoms of claudication or rest pain.  PMHx:  Past Medical History:  Diagnosis Date   AKI (acute kidney injury) ([name])    With STEMI in 2017   Chronic systolic CHF (congestive heart failure) ([name])    Depression    Diabetes mellitus without complication ([name])    Diabetic retinopathy ([name])    GERD (gastroesophageal reflux disease)    Hx of adenomatous colonic polyps 04/07/2018   Hyperlipidemia    Hypertension    Hypertensive retinopathy    Paroxysmal atrial fibrillation ([name])    Peripheral vascular disease ([name])    Pneumonia    Seizures ([name])    hx of as a child   STEMI (ST elevation myocardial infarction) ([name]) 2017    Past Surgical History:  Procedure Laterality Date   ABDOMINAL AORTOGRAM W/LOWER EXTREMITY N/A 09/19/2016   Procedure: Abdominal Aortogram w/Lower Extremity;  Surgeon: Iran Ouch, MD;  Location: [facility] INVASIVE CV LAB;  Service: Cardiovascular;   Laterality: N/A;   AMPUTATION Right 03/23/2016   Procedure: 1st and 2nd Ray Amputation Right Foot;  Surgeon: Nadara Mustard, MD;  Location: [facility] OR;  Service: Orthopedics;  Laterality: Right;   AMPUTATION Right 06/21/2016   Procedure: RIGHT TRANSMETATARSAL AMPUTATION;  Surgeon: Nadara Mustard, MD;  Location: [facility] OR;  Service: Orthopedics;  Laterality: Right;   CARDIAC CATHETERIZATION N/A 03/13/2016   Procedure: Right/Left Heart Cath and Coronary Angiography;  Surgeon: Tonny Bollman, MD;  Location: [facility] INVASIVE CV LAB;  Service: Cardiovascular;  Laterality: N/A;   CARDIAC CATHETERIZATION N/A 03/13/2016   Procedure: IABP Insertion;  Surgeon: Tonny Bollman, MD;  Location: [facility] INVASIVE CV LAB;  Service: Cardiovascular;  Laterality: N/A;   CATARACT EXTRACTION     CATARACT EXTRACTION, BILATERAL     CERVICAL FUSION  1982, 1992   has had 3 neck surgeries from breaking his neck   CIRCUMCISION N/A 11/07/2021   Procedure: CIRCUMCISION ADULT;  Surgeon: Despina Arias, MD;  Location: [facility] ORS;  Service: Urology;  Laterality: N/A;   CORONARY ARTERY BYPASS GRAFT N/A 03/13/2016   Procedure: CORONARY ARTERY BYPASS GRAFTING (CABG) x 1 (SVG to OM) with EVH from LEFT GREATER SAPHENOUS VEIN;  Surgeon: Kerin Perna, MD;  Location: [facility] OR;  Service: Open Heart Surgery;  Laterality: N/A;   EYE SURGERY     LOWER EXTREMITY ANGIOGRAM  05/02/2016   Procedure: Lower Extremity Angiogram;  Surgeon: Iran Ouch, MD;  Location: [facility] INVASIVE CV LAB;  Service: Cardiovascular;;  Limited left femoral runoff right femoral runoff   PERIPHERAL VASCULAR CATHETERIZATION N/A 05/02/2016   Procedure: Abdominal Aortogram;  Surgeon: Iran Ouch, MD;  Location: [facility] INVASIVE CV LAB;  Service: Cardiovascular;  Laterality: N/A;   PERIPHERAL VASCULAR CATHETERIZATION Right 05/02/2016   Procedure: Peripheral Vascular Balloon Angioplasty;  Surgeon: Iran Ouch, MD;  Location: [facility] INVASIVE CV LAB;  Service: Cardiovascular;  Laterality:  Right;  SFA   TEE WITHOUT CARDIOVERSION N/A 03/13/2016   Procedure: TRANSESOPHAGEAL ECHOCARDIOGRAM (TEE);  Surgeon: Kerin Perna, MD;  Location: [facility] OR;  Service: Open Heart Surgery;  Laterality: N/A;   VSD REPAIR N/A 03/13/2016   Procedure: VENTRICULAR SEPTAL DEFECT (VSD) REPAIR;  Surgeon: Kerin Perna, MD;  Location: [facility] OR;  Service: Open Heart Surgery;  Laterality: N/A;    FAMHx:  Family History  Problem Relation Age of Onset   Diabetes Maternal Grandmother    Diabetes Mother    Aneurysm Mother    Peripheral Artery Disease Mother    Coronary artery disease Mother    Peptic Ulcer Father    Retinoblastoma Daughter    Colon cancer Neg Hx    Rectal cancer Neg Hx     SOCHx:   reports that he quit smoking about 7 years ago. His smoking use included cigarettes. He has never used smokeless tobacco. He reports that he does not drink alcohol and does not use drugs.  ALLERGIES:  Allergies  Allergen Reactions   Morphine And Codeine Shortness Of Breath and Other (See Comments)    UNSPECIFIED REACTION "Pt said it was too much"    Latex Rash    ROS: Pertinent items noted in HPI and remainder of comprehensive ROS otherwise negative.  HOME MEDS: Current Outpatient Medications on File Prior to Visit  Medication Sig Dispense Refill   acetaminophen (TYLENOL) 500 MG tablet Take 500 mg by mouth every 6 (six) hours as needed for moderate pain.     carvedilol (COREG) 3.125 MG tablet TAKE 1 TABLET(3.125 MG) BY MOUTH TWICE DAILY WITH A MEAL 180 tablet 1   Castellani Paint 1.5 % LIQD Apply 1 application  topically 2 (two) times daily. 29.57 mL 1   Continuous Glucose Receiver (DEXCOM G7 RECEIVER) DEVI 1 Act by Does not apply route daily. 9 each 1   Continuous Glucose Sensor (DEXCOM G7 SENSOR) MISC 1 Act by Does not apply route daily. 9 each 1   dapagliflozin propanediol (FARXIGA) 10 MG TABS tablet TAKE 1 TABLET(10 MG) BY MOUTH DAILY 90 tablet 0   gabapentin (NEURONTIN) 300 MG capsule TAKE 1  CAPSULE(300 MG) BY MOUTH TWICE DAILY 180 capsule 0   Glucagon (GVOKE HYPOPEN 2-PACK) 1 MG/0.2ML SOAJ Inject 1 Act into the skin daily as needed. 2 mL 5   HUMALOG KWIKPEN 100 UNIT/ML KwikPen INJECT 10 UNITS UNDER THE SKIN THREE TIMES DAILY AS DIRECTED BY SLIDING SCALE (Patient taking differently: Inject 10-45 Units into the skin 3 (three) times daily. Pt takes with meals. Depends on sugar) 27 mL 1   insulin glargine (LANTUS SOLOSTAR) 100 UNIT/ML Solostar Pen Inject 60 Units into the skin daily. 45 mL 0   Insulin Pen Needle 31G X 8 MM MISC 1 each by Does not apply route 4 (four) times daily. 300 each 2   pantoprazole (PROTONIX) 40 MG tablet TAKE 1 TABLET(40 MG) BY MOUTH TWICE DAILY BEFORE A MEAL 180 tablet 0   pramipexole (MIRAPEX) 0.125 MG tablet TAKE 1 TABLET(0.125 MG)  BY MOUTH AT BEDTIME 90 tablet 1   rosuvastatin (CRESTOR) 40 MG tablet TAKE 1 TABLET BY MOUTH EVERY DAY 90 tablet 1   silver sulfADIAZINE (SILVADENE) 1 % cream Apply 1 Application topically daily. 400 g 2   sulfamethoxazole-trimethoprim (BACTRIM DS) 800-160 MG tablet Take 1 tablet by mouth 2 (two) times daily for 5 days. 10 tablet 0   torsemide (DEMADEX) 20 MG tablet Take 1 tablet (20 mg total) by mouth every other day. TAKE 1 TABLET(20 MG) BY MOUTH 3 TIMES A WEEK Monday, Wednesday and Friday 38 tablet 0   VASCEPA 1 g capsule TAKE 2 CAPSULES(2 GRAMS) BY MOUTH TWICE DAILY 360 capsule 1   warfarin (COUMADIN) 5 MG tablet Take 1 to 1 &1/2  by mouth daily as directed by the coumadin clinic (Patient taking differently: Take 1 to 1 &1/2  by mouth daily as directed by the coumadin clinic Take 7.5 mg by mouth daily (1.5 tablet of 5 mg) on Tues, Thurs, Sat and Sun. Takes 5 mg (one 5 mg tablet) on Mon, Wed and Fri.) 120 tablet 0   Continuous Glucose Sensor (FREESTYLE LIBRE 2 SENSOR) MISC APPLY A NEW SENSOR EVERY 14 DAYS (Patient not taking: Reported on 05/23/2023) 2 each 5   warfarin (COUMADIN) 5 MG tablet Take 5 mg by mouth daily. (Patient not  taking: Reported on 05/23/2023)     No current facility-administered medications on file prior to visit.    LABS/IMAGING: No results found for this or any previous visit (from the past 48 hour(s)). DG Foot 2 Views Right  Result Date: 05/22/2023 Please see detailed radiograph report in office note.  DG Foot 2 Views Left  Result Date: 05/22/2023 Please see detailed radiograph report in office note.   LIPID PANEL:    Component Value Date/Time   CHOL 126 11/05/2022 1141   CHOL 90 (L) 01/05/2019 1123   TRIG 290.0 (H) 11/05/2022 1141   HDL 32.10 (L) 11/05/2022 1141   HDL 34 (L) 01/05/2019 1123   CHOLHDL 4 11/05/2022 1141   VLDL 58.0 (H) 11/05/2022 1141   LDLCALC 43 05/07/2022 1354   LDLCALC 24 01/05/2019 1123   LDLCALC 45 03/20/2017 1144   LDLDIRECT 54.0 11/05/2022 1141     WEIGHTS: Wt Readings from Last 3 Encounters:  05/23/23 195 lb 12.8 oz (88.8 kg)  04/29/23 195 lb 9.6 oz (88.7 kg)  01/28/23 186 lb (84.4 kg)    VITALS: BP 126/66 (BP Location: Left Arm, Patient Position: Sitting, Cuff Size: Normal)   Pulse 80   Ht 5\' 5"  (1.651 m)   Wt 195 lb 12.8 oz (88.8 kg)   SpO2 98%   BMI 32.58 kg/m   EXAM: Extremities: no edema, redness or tenderness in the calves or thighs, varicose veins noted, and raised scabbed area between the fourth and fifth digits of the left foot, does not appear to be an ulceration, the digits exhibit normal color and mildly delayed capillary refill at 3 seconds. Pulses: Faint DP/PT pulses on the left, somewhat better on the right Skin: Pale, cool, dry, no erythema or warmth  EKG: Deferred  ASSESSMENT: Nonhealing left foot wound PAD   PLAN: 1.   William [name] has had a nonhealing left foot wound and has underlying PAD.  He was seen by podiatry yesterday and had x-rays and it was felt that his pulses were further diminished.  He did have a mildly decreased ABI by Dopplers back in July.  I will reorder Dopplers and recommend follow-up with  Dr.  Renato Gails.  At this point I do not see any evidence of necrosis or digital ischemia, rest foot pain or claudication concerning for any acute limb ischemia.  The wound is unusual in location and appearance for a arterial or vascular ulcer.  I wonder if this could be more of a dermatologic pathology with underlying PAD.  It was pointed out that this had actually healed however then started worsening again.  There is intermittent bleeding associated with it, however he is on warfarin.  Chrystie Nose, MD, [facility], FACP  [place]  [facility]  Medical Director of the [facility]  [facility] Diplomate of the American Board of Clinical Lipidology Attending Cardiologist  Direct Dial: [phone]  Fax: [phone]  Website:  [email or web address] Nicely Royelle Hinchman 05/23/2023, 1:48 PM

## 2023-05-27 ENCOUNTER — Ambulatory Visit: Payer: 59 | Attending: Cardiology | Admitting: *Deleted

## 2023-05-27 DIAGNOSIS — I480 Paroxysmal atrial fibrillation: Secondary | ICD-10-CM | POA: Diagnosis not present

## 2023-05-27 DIAGNOSIS — Z5181 Encounter for therapeutic drug level monitoring: Secondary | ICD-10-CM

## 2023-05-27 LAB — POCT INR: INR: 3.3 — AB (ref 2.0–3.0)

## 2023-05-27 NOTE — Patient Instructions (Signed)
Description   Do not take any warfarin today then continue taking 1.5 tablets daily except for 1 tablet every Monday, Wednesday and Friday. Recheck INR in 5 weeks.  Anticoagulation Clinic [phone]

## 2023-06-02 ENCOUNTER — Other Ambulatory Visit: Payer: Self-pay | Admitting: Internal Medicine

## 2023-06-02 DIAGNOSIS — K219 Gastro-esophageal reflux disease without esophagitis: Secondary | ICD-10-CM

## 2023-06-04 ENCOUNTER — Emergency Department (HOSPITAL_COMMUNITY): Payer: 59

## 2023-06-04 ENCOUNTER — Other Ambulatory Visit: Payer: Self-pay

## 2023-06-04 ENCOUNTER — Encounter (HOSPITAL_COMMUNITY): Payer: Self-pay

## 2023-06-04 ENCOUNTER — Inpatient Hospital Stay (HOSPITAL_COMMUNITY)
Admission: EM | Admit: 2023-06-04 | Discharge: 2023-06-06 | DRG: 871 | Disposition: A | Discharge status: Home / Self Care | Payer: 59 | Attending: Family Medicine | Admitting: Family Medicine

## 2023-06-04 DIAGNOSIS — E11319 Type 2 diabetes mellitus with unspecified diabetic retinopathy without macular edema: Secondary | ICD-10-CM | POA: Diagnosis not present

## 2023-06-04 DIAGNOSIS — I5022 Chronic systolic (congestive) heart failure: Secondary | ICD-10-CM | POA: Diagnosis present

## 2023-06-04 DIAGNOSIS — Z794 Long term (current) use of insulin: Secondary | ICD-10-CM

## 2023-06-04 DIAGNOSIS — Z91040 Latex allergy status: Secondary | ICD-10-CM

## 2023-06-04 DIAGNOSIS — Z79899 Other long term (current) drug therapy: Secondary | ICD-10-CM

## 2023-06-04 DIAGNOSIS — I499 Cardiac arrhythmia, unspecified: Secondary | ICD-10-CM | POA: Diagnosis not present

## 2023-06-04 DIAGNOSIS — Z8249 Family history of ischemic heart disease and other diseases of the circulatory system: Secondary | ICD-10-CM | POA: Diagnosis not present

## 2023-06-04 DIAGNOSIS — J189 Pneumonia, unspecified organism: Principal | ICD-10-CM

## 2023-06-04 DIAGNOSIS — Z743 Need for continuous supervision: Secondary | ICD-10-CM | POA: Diagnosis not present

## 2023-06-04 DIAGNOSIS — Z833 Family history of diabetes mellitus: Secondary | ICD-10-CM

## 2023-06-04 DIAGNOSIS — Z87891 Personal history of nicotine dependence: Secondary | ICD-10-CM

## 2023-06-04 DIAGNOSIS — K8020 Calculus of gallbladder without cholecystitis without obstruction: Secondary | ICD-10-CM | POA: Diagnosis not present

## 2023-06-04 DIAGNOSIS — K219 Gastro-esophageal reflux disease without esophagitis: Secondary | ICD-10-CM | POA: Diagnosis present

## 2023-06-04 DIAGNOSIS — N281 Cyst of kidney, acquired: Secondary | ICD-10-CM | POA: Diagnosis not present

## 2023-06-04 DIAGNOSIS — Z7901 Long term (current) use of anticoagulants: Secondary | ICD-10-CM

## 2023-06-04 DIAGNOSIS — N1832 Chronic kidney disease, stage 3b: Secondary | ICD-10-CM | POA: Diagnosis not present

## 2023-06-04 DIAGNOSIS — I517 Cardiomegaly: Secondary | ICD-10-CM | POA: Diagnosis not present

## 2023-06-04 DIAGNOSIS — E119 Type 2 diabetes mellitus without complications: Secondary | ICD-10-CM | POA: Diagnosis not present

## 2023-06-04 DIAGNOSIS — Z808 Family history of malignant neoplasm of other organs or systems: Secondary | ICD-10-CM

## 2023-06-04 DIAGNOSIS — J181 Lobar pneumonia, unspecified organism: Secondary | ICD-10-CM | POA: Diagnosis not present

## 2023-06-04 DIAGNOSIS — I252 Old myocardial infarction: Secondary | ICD-10-CM | POA: Diagnosis not present

## 2023-06-04 DIAGNOSIS — R7401 Elevation of levels of liver transaminase levels: Secondary | ICD-10-CM | POA: Diagnosis present

## 2023-06-04 DIAGNOSIS — E1122 Type 2 diabetes mellitus with diabetic chronic kidney disease: Secondary | ICD-10-CM | POA: Diagnosis not present

## 2023-06-04 DIAGNOSIS — E1151 Type 2 diabetes mellitus with diabetic peripheral angiopathy without gangrene: Secondary | ICD-10-CM | POA: Diagnosis present

## 2023-06-04 DIAGNOSIS — I2510 Atherosclerotic heart disease of native coronary artery without angina pectoris: Secondary | ICD-10-CM | POA: Diagnosis present

## 2023-06-04 DIAGNOSIS — K5909 Other constipation: Secondary | ICD-10-CM | POA: Diagnosis present

## 2023-06-04 DIAGNOSIS — R0989 Other specified symptoms and signs involving the circulatory and respiratory systems: Secondary | ICD-10-CM | POA: Diagnosis not present

## 2023-06-04 DIAGNOSIS — Z7984 Long term (current) use of oral hypoglycemic drugs: Secondary | ICD-10-CM

## 2023-06-04 DIAGNOSIS — I130 Hypertensive heart and chronic kidney disease with heart failure and stage 1 through stage 4 chronic kidney disease, or unspecified chronic kidney disease: Secondary | ICD-10-CM | POA: Diagnosis present

## 2023-06-04 DIAGNOSIS — A419 Sepsis, unspecified organism: Secondary | ICD-10-CM | POA: Diagnosis not present

## 2023-06-04 DIAGNOSIS — Z885 Allergy status to narcotic agent status: Secondary | ICD-10-CM

## 2023-06-04 DIAGNOSIS — R918 Other nonspecific abnormal finding of lung field: Secondary | ICD-10-CM | POA: Diagnosis not present

## 2023-06-04 DIAGNOSIS — H35039 Hypertensive retinopathy, unspecified eye: Secondary | ICD-10-CM | POA: Diagnosis present

## 2023-06-04 DIAGNOSIS — R000 Tachycardia, unspecified: Secondary | ICD-10-CM | POA: Diagnosis not present

## 2023-06-04 DIAGNOSIS — I10 Essential (primary) hypertension: Secondary | ICD-10-CM | POA: Diagnosis present

## 2023-06-04 DIAGNOSIS — E785 Hyperlipidemia, unspecified: Secondary | ICD-10-CM | POA: Diagnosis not present

## 2023-06-04 DIAGNOSIS — Z981 Arthrodesis status: Secondary | ICD-10-CM

## 2023-06-04 DIAGNOSIS — I2583 Coronary atherosclerosis due to lipid rich plaque: Secondary | ICD-10-CM | POA: Diagnosis not present

## 2023-06-04 DIAGNOSIS — I700 Atherosclerosis of aorta: Secondary | ICD-10-CM | POA: Diagnosis present

## 2023-06-04 DIAGNOSIS — Z951 Presence of aortocoronary bypass graft: Secondary | ICD-10-CM

## 2023-06-04 DIAGNOSIS — I480 Paroxysmal atrial fibrillation: Secondary | ICD-10-CM | POA: Diagnosis present

## 2023-06-04 DIAGNOSIS — R0689 Other abnormalities of breathing: Secondary | ICD-10-CM | POA: Diagnosis not present

## 2023-06-04 DIAGNOSIS — R161 Splenomegaly, not elsewhere classified: Secondary | ICD-10-CM | POA: Diagnosis present

## 2023-06-04 DIAGNOSIS — Z860101 Personal history of adenomatous and serrated colon polyps: Secondary | ICD-10-CM

## 2023-06-04 LAB — COMPREHENSIVE METABOLIC PANEL
ALT: 49 U/L — ABNORMAL HIGH (ref 0–44)
AST: 44 U/L — ABNORMAL HIGH (ref 15–41)
Albumin: 5.1 g/dL — ABNORMAL HIGH (ref 3.5–5.0)
Alkaline Phosphatase: 121 U/L (ref 38–126)
Anion gap: 12 (ref 5–15)
BUN: 38 mg/dL — ABNORMAL HIGH (ref 8–23)
CO2: 25 mmol/L (ref 22–32)
Calcium: 9.8 mg/dL (ref 8.9–10.3)
Chloride: 102 mmol/L (ref 98–111)
Creatinine, Ser: 1.76 mg/dL — ABNORMAL HIGH (ref 0.61–1.24)
GFR, Estimated: 41 mL/min — ABNORMAL LOW (ref >60)
Glucose, Bld: 84 mg/dL (ref 70–99)
Potassium: 4.3 mmol/L (ref 3.5–5.1)
Sodium: 139 mmol/L (ref 135–145)
Total Bilirubin: 1.1 mg/dL (ref <1.2)
Total Protein: 8.7 g/dL — ABNORMAL HIGH (ref 6.5–8.1)

## 2023-06-04 LAB — CBC WITH DIFFERENTIAL/PLATELET
Abs Immature Granulocytes: 0.02 10*3/uL (ref 0.00–0.07)
Basophils Absolute: 0 10*3/uL (ref 0.0–0.1)
Basophils Relative: 0 %
Eosinophils Absolute: 0.1 10*3/uL (ref 0.0–0.5)
Eosinophils Relative: 1 %
HCT: 49 % (ref 39.0–52.0)
Hemoglobin: 15.8 g/dL (ref 13.0–17.0)
Immature Granulocytes: 0 %
Lymphocytes Relative: 11 %
Lymphs Abs: 1.1 10*3/uL (ref 0.7–4.0)
MCH: 29 pg (ref 26.0–34.0)
MCHC: 32.2 g/dL (ref 30.0–36.0)
MCV: 90.1 fL (ref 80.0–100.0)
Monocytes Absolute: 0.4 10*3/uL (ref 0.1–1.0)
Monocytes Relative: 4 %
Neutro Abs: 8.1 10*3/uL — ABNORMAL HIGH (ref 1.7–7.7)
Neutrophils Relative %: 84 %
Platelets: 184 10*3/uL (ref 150–400)
RBC: 5.44 MIL/uL (ref 4.22–5.81)
RDW: 14.6 % (ref 11.5–15.5)
WBC: 9.6 10*3/uL (ref 4.0–10.5)
nRBC: 0 % (ref 0.0–0.2)

## 2023-06-04 LAB — PROTIME-INR
INR: 1.8 — ABNORMAL HIGH (ref 0.8–1.2)
Prothrombin Time: 20.7 s — ABNORMAL HIGH (ref 11.4–15.2)

## 2023-06-04 LAB — URINALYSIS, W/ REFLEX TO CULTURE (INFECTION SUSPECTED)
Bacteria, UA: NONE SEEN
Bilirubin Urine: NEGATIVE
Glucose, UA: >=500 mg/dL — AB
Hgb urine dipstick: NEGATIVE
Ketones, ur: 5 mg/dL — AB
Leukocytes,Ua: NEGATIVE
Nitrite: NEGATIVE
Protein, ur: 30 mg/dL — AB
Specific Gravity, Urine: 1.022 (ref 1.005–1.030)
pH: 5 (ref 5.0–8.0)

## 2023-06-04 LAB — APTT: aPTT: 45 s — ABNORMAL HIGH (ref 24–36)

## 2023-06-04 LAB — CBG MONITORING, ED: Glucose-Capillary: 76 mg/dL (ref 70–99)

## 2023-06-04 LAB — I-STAT CG4 LACTIC ACID, ED: Lactic Acid, Venous: 1.3 mmol/L (ref 0.5–1.9)

## 2023-06-04 MED ORDER — IOHEXOL 300 MG/ML  SOLN
80.0000 mL | Freq: Once | INTRAMUSCULAR | Status: AC | PRN
  Administered 2023-06-04: 80 mL via INTRAVENOUS

## 2023-06-04 MED ORDER — SODIUM CHLORIDE 0.9 % IV SOLN
1.0000 g | Freq: Once | INTRAVENOUS | Status: AC
  Administered 2023-06-04: 1 g via INTRAVENOUS
  Filled 2023-06-04: qty 10

## 2023-06-04 MED ORDER — LACTATED RINGERS IV BOLUS (SEPSIS)
1000.0000 mL | Freq: Once | INTRAVENOUS | Status: AC
  Administered 2023-06-04: 1000 mL via INTRAVENOUS

## 2023-06-04 MED ORDER — ONDANSETRON HCL 4 MG/2ML IJ SOLN
4.0000 mg | Freq: Once | INTRAMUSCULAR | Status: AC
  Administered 2023-06-04: 4 mg via INTRAVENOUS
  Filled 2023-06-04: qty 2

## 2023-06-04 MED ORDER — AZITHROMYCIN 250 MG PO TABS
500.0000 mg | ORAL_TABLET | Freq: Once | ORAL | Status: AC
  Administered 2023-06-04: 500 mg via ORAL
  Filled 2023-06-04: qty 2

## 2023-06-04 NOTE — ED Provider Notes (Signed)
[facility] EMERGENCY DEPARTMENT AT [facility] Provider Note   CSN: [id number] Arrival date & time: 06/04/23  2023     History  Chief Complaint  Patient presents with   Chills    William [name] is a 72 y.o. male.  Patient is a 72 year old male with a past medical history of hypertension, diabetes, CHF, A-fib on Coumadin presenting to the emergency department with tremors, chills and abdominal pain.  Patient states that he woke up from a nap this afternoon and felt normal.  He states he ate dinner and shortly after dinner started to feel tremulous and chilled.  He states that he felt nauseous but has not vomited.  He states that he checked his blood sugar that was normal and his temperature was 99 and tried taking Tylenol without improvement.  He states having pain across his lower abdomen.  He denies any dysuria or hematuria.  He states he has chronic constipation and last bowel movement was yesterday and is still passing normal gas.  The history is provided by the patient.       Home Medications Prior to Admission medications   Medication Sig Start Date End Date Taking? Authorizing Provider  acetaminophen (TYLENOL) 500 MG tablet Take 500 mg by mouth every 6 (six) hours as needed for moderate pain.    [name], MD  carvedilol (COREG) 3.125 MG tablet TAKE 1 TABLET(3.125 MG) BY MOUTH TWICE DAILY WITH A MEAL 02/24/23   [name], MD  Robyne Askew Paint 1.5 % LIQD Apply 1 application  topically 2 (two) times daily. 11/19/22   Felecia Shelling, DPM  Continuous Glucose Receiver (DEXCOM G7 RECEIVER) DEVI 1 Act by Does not apply route daily. 04/29/23   [name], MD  Continuous Glucose Sensor (DEXCOM G7 SENSOR) MISC 1 Act by Does not apply route daily. 04/29/23   [name], MD  Continuous Glucose Sensor (FREESTYLE LIBRE 2 SENSOR) MISC APPLY A NEW SENSOR EVERY 14 DAYS Patient not taking: Reported on 05/23/2023 12/30/22   [name], MD  dapagliflozin  propanediol (FARXIGA) 10 MG TABS tablet TAKE 1 TABLET(10 MG) BY MOUTH DAILY 05/16/23   [name], MD  gabapentin (NEURONTIN) 300 MG capsule TAKE 1 CAPSULE(300 MG) BY MOUTH TWICE DAILY 05/13/23   [name], MD  Glucagon (GVOKE HYPOPEN 2-PACK) 1 MG/0.2ML SOAJ Inject 1 Act into the skin daily as needed. 05/07/22   [name], MD  HUMALOG KWIKPEN 100 UNIT/ML KwikPen INJECT 10 UNITS UNDER THE SKIN THREE TIMES DAILY AS DIRECTED BY SLIDING SCALE Patient taking differently: Inject 10-45 Units into the skin 3 (three) times daily. Pt takes with meals. Depends on sugar 10/14/21   [name], MD  insulin glargine (LANTUS SOLOSTAR) 100 UNIT/ML Solostar Pen Inject 60 Units into the skin daily. 05/20/23   [name], MD  Insulin Pen Needle 31G X 8 MM MISC 1 each by Does not apply route 4 (four) times daily. 04/29/23   [name], MD  pantoprazole (PROTONIX) 40 MG tablet TAKE 1 TABLET(40 MG) BY MOUTH TWICE DAILY BEFORE A MEAL 06/02/23   [name], MD  pramipexole (MIRAPEX) 0.125 MG tablet TAKE 1 TABLET(0.125 MG) BY MOUTH AT BEDTIME 02/18/23   [name], MD  rosuvastatin (CRESTOR) 40 MG tablet TAKE 1 TABLET BY MOUTH EVERY DAY 02/03/23   [name], MD  silver sulfADIAZINE (SILVADENE) 1 % cream Apply 1 Application topically daily. 11/19/22   Logan Bores,  Larena Glassman, DPM  torsemide (DEMADEX) 20 MG tablet Take 1 tablet (20 mg total) by mouth every other day. TAKE 1 TABLET(20 MG) BY MOUTH 3 TIMES A WEEK Monday, Wednesday and Friday 05/15/23   [name], MD  VASCEPA 1 g capsule TAKE 2 CAPSULES(2 GRAMS) BY MOUTH TWICE DAILY 02/12/23   [name], MD  warfarin (COUMADIN) 5 MG tablet Take 1 to 1 &1/2  by mouth daily as directed by the coumadin clinic Patient taking differently: Take 1 to 1 &1/2  by mouth daily as directed by the coumadin clinic Take 7.5 mg by mouth daily (1.5 tablet of 5 mg) on Tues, Thurs, Sat and Sun. Takes 5 mg (one 5 mg tablet) on Mon, Wed and Fri. 09/10/22    Iran Ouch, MD  warfarin (COUMADIN) 5 MG tablet Take 5 mg by mouth daily. Patient not taking: Reported on 05/23/2023    [provider]      Allergies    Morphine and codeine and Latex    Review of Systems   Review of Systems  Physical Exam Updated Vital Signs BP 124/68   Pulse (!) 117   Temp 98.8 F (37.1 C)   Resp (!) 21   SpO2 95%  Physical Exam Vitals and nursing note reviewed.  Constitutional:      General: He is not in acute distress.    Appearance: Normal appearance.  HENT:     Head: Normocephalic and atraumatic.     Nose: Nose normal.     Mouth/Throat:     Mouth: Mucous membranes are moist.     Pharynx: Oropharynx is clear.  Eyes:     Extraocular Movements: Extraocular movements intact.     Conjunctiva/sclera: Conjunctivae normal.  Cardiovascular:     Rate and Rhythm: Regular rhythm. Tachycardia present.     Heart sounds: Normal heart sounds.  Pulmonary:     Effort: Pulmonary effort is normal.     Breath sounds: Normal breath sounds.  Abdominal:     General: Abdomen is flat.     Palpations: Abdomen is soft.     Tenderness: There is abdominal tenderness (Across lower abdomen). There is no guarding or rebound.  Musculoskeletal:        General: Normal range of motion.     Cervical back: Normal range of motion.     Comments: Arms tremulous on extension  Skin:    General: Skin is warm and dry.  Neurological:     General: No focal deficit present.     Mental Status: He is alert and oriented to person, place, and time.  Psychiatric:        Mood and Affect: Mood normal.        Behavior: Behavior normal.     ED Results / Procedures / Treatments   Labs (all labs ordered are listed, but only abnormal results are displayed) Labs Reviewed  COMPREHENSIVE METABOLIC PANEL - Abnormal; Notable for the following components:      Result Value   BUN 38 (*)    Creatinine, Ser 1.76 (*)    Total Protein 8.7 (*)    Albumin 5.1 (*)    AST 44 (*)     ALT 49 (*)    GFR, Estimated 41 (*)    All other components within normal limits  CBC WITH DIFFERENTIAL/PLATELET - Abnormal; Notable for the following components:   Neutro Abs 8.1 (*)    All other components within normal limits  PROTIME-INR -  Abnormal; Notable for the following components:   Prothrombin Time 20.7 (*)    INR 1.8 (*)    All other components within normal limits  APTT - Abnormal; Notable for the following components:   aPTT 45 (*)    All other components within normal limits  URINALYSIS, W/ REFLEX TO CULTURE (INFECTION SUSPECTED) - Abnormal; Notable for the following components:   Glucose, UA >=500 (*)    Ketones, ur 5 (*)    Protein, ur 30 (*)    All other components within normal limits  CULTURE, BLOOD (ROUTINE X 2)  CULTURE, BLOOD (ROUTINE X 2)  I-STAT CG4 LACTIC ACID, ED  CBG MONITORING, ED    EKG EKG Interpretation Date/Time:  Tuesday June 04 2023 21:22:08 EST Ventricular Rate:  119 PR Interval:  143 QRS Duration:  140 QT Interval:  343 QTC Calculation: 483 R Axis:   98  Text Interpretation: Sinus tachycardia Nonspecific intraventricular conduction delay Inferior infarct, age indeterminate Anterior infarct, old Artifact in lead(s) I II III aVR aVL aVF HEART RATE INCREASED SINCE last EKG otherwise no signficant change Confirmed by Elayne Snare ([id number]) on 06/04/2023 10:17:48 PM  Radiology CT ABDOMEN PELVIS W CONTRAST  Result Date: 06/04/2023 CLINICAL DATA:  Left lower quadrant pain EXAM: CT ABDOMEN AND PELVIS WITH CONTRAST TECHNIQUE: Multidetector CT imaging of the abdomen and pelvis was performed using the standard protocol following bolus administration of intravenous contrast. RADIATION DOSE REDUCTION: This exam was performed according to the departmental dose-optimization program which includes automated exposure control, adjustment of the mA and/or kV according to patient size and/or use of iterative reconstruction technique. CONTRAST:  80mL  OMNIPAQUE IOHEXOL 300 MG/ML  SOLN COMPARISON:  CT 05/13/2023 FINDINGS: Lower chest: Lung bases demonstrate no pleural effusion. Central lobular ground-glass disease in the lingula in left lower lobe and to a lesser extent in the right lung base, consistent with pneumonia. Mild cardiomegaly. Coronary vascular calcification Hepatobiliary: Gallstones. No focal hepatic abnormality or biliary dilatation Pancreas: Unremarkable. No pancreatic ductal dilatation or surrounding inflammatory changes. Spleen: Slightly enlarged measuring 15 cm AP Adrenals/Urinary Tract: Adrenal glands are normal. Kidneys show no hydronephrosis. Small left renal cysts for which no imaging follow-up is recommended. The bladder is unremarkable Stomach/Bowel: The stomach is nonenlarged. There is no dilated small bowel. No acute bowel wall thickening. Negative appendix. Vascular/Lymphatic: Moderate aortic atherosclerosis. No aneurysm. No suspicious lymph nodes Reproductive: Enlarged prostate Other: Negative for pelvic effusion or free air. Musculoskeletal: No acute or suspicious osseous abnormality. Multilevel degenerative changes IMPRESSION: 1. No CT evidence for acute intra-abdominal or pelvic abnormality. 2. Findings consistent with pneumonia involving the lingula and left greater than right lower lobes. 3. Gallstones. 4. Mild splenomegaly. 5. Enlarged prostate. Aortic Atherosclerosis (ICD10-I70.0). Electronically Signed   By: Jasmine Pang M.D.   On: 06/04/2023 22:41   DG Chest Port 1 View  Result Date: 06/04/2023 CLINICAL DATA:  Chills tremors EXAM: PORTABLE CHEST 1 VIEW COMPARISON:  04/16/2016 FINDINGS: Post sternotomy changes. Cardiomegaly with slight central congestion. Patchy airspace opacities in the left mid lung and bilateral bases. Aortic atherosclerosis. No pneumothorax IMPRESSION: Cardiomegaly with slight central congestion. Patchy airspace opacities in the left mid lung and bilateral bases, suspicious for pneumonia Electronically  Signed   By: Jasmine Pang M.D.   On: 06/04/2023 21:47    Procedures Procedures    Medications Ordered in ED Medications  ondansetron (ZOFRAN) injection 4 mg (has no administration in time range)  cefTRIAXone (ROCEPHIN) 1 g in sodium chloride 0.9 % 100  mL IVPB (has no administration in time range)  azithromycin (ZITHROMAX) tablet 500 mg (has no administration in time range)  lactated ringers bolus 1,000 mL (1,000 mLs Intravenous New Bag/Given 06/04/23 2112)  iohexol (OMNIPAQUE) 300 MG/ML solution 80 mL (80 mLs Intravenous Contrast Given 06/04/23 2215)    ED Course/ Medical Decision Making/ A&P Clinical Course as of 06/04/23 2307  Tue Jun 04, 2023  2216 Cr at baseline, mild transaminitis. CT pending. [VK]  2216 CXR concerning for possible pneumonia, patient does report chronic cough, not worse from baseline. [VK]  2249 Findings concerning for pneumonia in the L>R lower lungs, no other acute abnormalities within abdomen and pelvis. Does have gallstones, no signs of GB infection. [VK]  2254 Patient persistently tachycardic to 117, will have CTPE study. Patient reports his heart rate is always high but last cardiology notes his HR was in the 80's. Does not appear to be on any rate control medications. [VK]  2305 Patient signed out to Dr. Renaye Rakers pending CTPE study and reassessment. [VK]    Clinical Course User Index [VK] Rexford Maus, DO                                 Medical Decision Making This patient presents to the ED with chief complaint(s) of tremors, chills, abdominal pain with pertinent past medical history of diabetes, hypertension, CHF, A-fib on Coumadin which further complicates the presenting complaint. The complaint involves an extensive differential diagnosis and also carries with it a high risk of complications and morbidity.    The differential diagnosis includes dehydration, electrolyte abnormality, arrhythmia, anemia, infection, diverticulitis, appendicitis, UTI,  other intra-abdominal infection, GI bleed, viral syndrome  Additional history obtained: Additional history obtained from N/A Records reviewed patient cardiology records  ED Course and Reassessment: On patient's arrival he is tachycardic and tachypneic but in no acute distress and satting well on room air.  His abdomen was soft but was tender in the lower abdomen.  Will undergo workup to evaluate for infection or other etiology of his symptoms.  He will be given gentle fluids.  He declined any pain or nausea control at this time will be closely reassessed.  Independent labs interpretation:  The following labs were independently interpreted: mild transaminitis, otherwise at baseline  Independent visualization of imaging: - I independently visualized the following imaging with scope of interpretation limited to determining acute life threatening conditions related to emergency care: CXR, CTAP, which revealed L>R LL pneumonia, cholelithiasis without cholecystitis    Amount and/or Complexity of Data Reviewed Labs: ordered. Radiology: ordered. ECG/medicine tests: ordered.  Risk Prescription drug management.          Final Clinical Impression(s) / ED Diagnoses Final diagnoses:  Community acquired pneumonia of left lower lobe of lung  Calculus of gallbladder without cholecystitis without obstruction    Rx / DC Orders ED Discharge Orders     None         Rexford Maus, DO 06/04/23 2307

## 2023-06-04 NOTE — ED Triage Notes (Signed)
Pt arriving via [facility] from home. Approx 1 hr ago pt began having involuntary chills/tremors. Family reports temp of 99.1 and gave pt Tylenol around 1945. Pt has chronic cough. Just finished medications for kidney infection. Hx CHF and diabetes.

## 2023-06-04 NOTE — ED Triage Notes (Addendum)
Triage note entered in error

## 2023-06-04 NOTE — ED Provider Notes (Signed)
72 yo male w/ a fib on coumadin here with shortness of breath, tachycardic  CT imaging concerning for pneumonia  Pending CT PE study, antibiotics Given IV fluids here   Physical Exam  BP 124/68   Pulse (!) 117   Temp 98.8 F (37.1 C)   Resp (!) 21   SpO2 95%   Physical Exam  Procedures  Procedures  ED Course / MDM   Clinical Course as of 06/04/23 2302  Tue Jun 04, 2023  2216 Cr at baseline, mild transaminitis. CT pending. [VK]  2216 CXR concerning for possible pneumonia, patient does report chronic cough, not worse from baseline. [VK]  2249 Findings concerning for pneumonia in the L>R lower lungs, no other acute abnormalities within abdomen and pelvis. Does have gallstones, no signs of GB infection. [VK]  2254 Patient persistently tachycardic to 117, will have CTPE study. Patient reports his heart rate is always high but last cardiology notes his HR was in the 80's. Does not appear to be on any rate control medications. [VK]    Clinical Course User Index [VK] Rexford Maus, DO   Medical Decision Making Amount and/or Complexity of Data Reviewed Labs: ordered. Radiology: ordered. ECG/medicine tests: ordered.  Risk Prescription drug management.   ***

## 2023-06-05 ENCOUNTER — Encounter (HOSPITAL_COMMUNITY): Payer: Self-pay | Admitting: Family Medicine

## 2023-06-05 DIAGNOSIS — H35039 Hypertensive retinopathy, unspecified eye: Secondary | ICD-10-CM | POA: Diagnosis present

## 2023-06-05 DIAGNOSIS — E785 Hyperlipidemia, unspecified: Secondary | ICD-10-CM | POA: Diagnosis present

## 2023-06-05 DIAGNOSIS — R7401 Elevation of levels of liver transaminase levels: Secondary | ICD-10-CM | POA: Diagnosis present

## 2023-06-05 DIAGNOSIS — I2583 Coronary atherosclerosis due to lipid rich plaque: Secondary | ICD-10-CM | POA: Diagnosis not present

## 2023-06-05 DIAGNOSIS — Z7901 Long term (current) use of anticoagulants: Secondary | ICD-10-CM | POA: Diagnosis not present

## 2023-06-05 DIAGNOSIS — I5022 Chronic systolic (congestive) heart failure: Secondary | ICD-10-CM

## 2023-06-05 DIAGNOSIS — E1122 Type 2 diabetes mellitus with diabetic chronic kidney disease: Secondary | ICD-10-CM | POA: Diagnosis not present

## 2023-06-05 DIAGNOSIS — Z8249 Family history of ischemic heart disease and other diseases of the circulatory system: Secondary | ICD-10-CM | POA: Diagnosis not present

## 2023-06-05 DIAGNOSIS — N1832 Chronic kidney disease, stage 3b: Secondary | ICD-10-CM | POA: Diagnosis not present

## 2023-06-05 DIAGNOSIS — J181 Lobar pneumonia, unspecified organism: Secondary | ICD-10-CM | POA: Diagnosis present

## 2023-06-05 DIAGNOSIS — R161 Splenomegaly, not elsewhere classified: Secondary | ICD-10-CM | POA: Diagnosis present

## 2023-06-05 DIAGNOSIS — I700 Atherosclerosis of aorta: Secondary | ICD-10-CM | POA: Diagnosis present

## 2023-06-05 DIAGNOSIS — J189 Pneumonia, unspecified organism: Secondary | ICD-10-CM | POA: Diagnosis not present

## 2023-06-05 DIAGNOSIS — E1151 Type 2 diabetes mellitus with diabetic peripheral angiopathy without gangrene: Secondary | ICD-10-CM | POA: Diagnosis present

## 2023-06-05 DIAGNOSIS — E119 Type 2 diabetes mellitus without complications: Secondary | ICD-10-CM

## 2023-06-05 DIAGNOSIS — Z794 Long term (current) use of insulin: Secondary | ICD-10-CM

## 2023-06-05 DIAGNOSIS — I252 Old myocardial infarction: Secondary | ICD-10-CM | POA: Diagnosis not present

## 2023-06-05 DIAGNOSIS — I130 Hypertensive heart and chronic kidney disease with heart failure and stage 1 through stage 4 chronic kidney disease, or unspecified chronic kidney disease: Secondary | ICD-10-CM | POA: Diagnosis present

## 2023-06-05 DIAGNOSIS — A419 Sepsis, unspecified organism: Secondary | ICD-10-CM

## 2023-06-05 DIAGNOSIS — I480 Paroxysmal atrial fibrillation: Secondary | ICD-10-CM | POA: Diagnosis not present

## 2023-06-05 DIAGNOSIS — I2510 Atherosclerotic heart disease of native coronary artery without angina pectoris: Secondary | ICD-10-CM | POA: Diagnosis not present

## 2023-06-05 DIAGNOSIS — E11319 Type 2 diabetes mellitus with unspecified diabetic retinopathy without macular edema: Secondary | ICD-10-CM | POA: Diagnosis present

## 2023-06-05 DIAGNOSIS — K219 Gastro-esophageal reflux disease without esophagitis: Secondary | ICD-10-CM | POA: Diagnosis present

## 2023-06-05 DIAGNOSIS — Z833 Family history of diabetes mellitus: Secondary | ICD-10-CM | POA: Diagnosis not present

## 2023-06-05 DIAGNOSIS — K5909 Other constipation: Secondary | ICD-10-CM | POA: Diagnosis present

## 2023-06-05 DIAGNOSIS — K8020 Calculus of gallbladder without cholecystitis without obstruction: Secondary | ICD-10-CM | POA: Diagnosis present

## 2023-06-05 LAB — COMPREHENSIVE METABOLIC PANEL
ALT: 47 U/L — ABNORMAL HIGH (ref 0–44)
AST: 42 U/L — ABNORMAL HIGH (ref 15–41)
Albumin: 3.5 g/dL (ref 3.5–5.0)
Alkaline Phosphatase: 88 U/L (ref 38–126)
Anion gap: 9 (ref 5–15)
BUN: 40 mg/dL — ABNORMAL HIGH (ref 8–23)
CO2: 22 mmol/L (ref 22–32)
Calcium: 8.7 mg/dL — ABNORMAL LOW (ref 8.9–10.3)
Chloride: 107 mmol/L (ref 98–111)
Creatinine, Ser: 1.75 mg/dL — ABNORMAL HIGH (ref 0.61–1.24)
GFR, Estimated: 41 mL/min — ABNORMAL LOW (ref >60)
Glucose, Bld: 86 mg/dL (ref 70–99)
Potassium: 4 mmol/L (ref 3.5–5.1)
Sodium: 138 mmol/L (ref 135–145)
Total Bilirubin: 0.8 mg/dL (ref <1.2)
Total Protein: 6.2 g/dL — ABNORMAL LOW (ref 6.5–8.1)

## 2023-06-05 LAB — GLUCOSE, CAPILLARY
Glucose-Capillary: 111 mg/dL — ABNORMAL HIGH (ref 70–99)
Glucose-Capillary: 113 mg/dL — ABNORMAL HIGH (ref 70–99)

## 2023-06-05 LAB — CBC
HCT: 35.5 % — ABNORMAL LOW (ref 39.0–52.0)
Hemoglobin: 11.9 g/dL — ABNORMAL LOW (ref 13.0–17.0)
MCH: 29.7 pg (ref 26.0–34.0)
MCHC: 33.5 g/dL (ref 30.0–36.0)
MCV: 88.5 fL (ref 80.0–100.0)
Platelets: 138 10*3/uL — ABNORMAL LOW (ref 150–400)
RBC: 4.01 MIL/uL — ABNORMAL LOW (ref 4.22–5.81)
RDW: 14.6 % (ref 11.5–15.5)
WBC: 18.1 10*3/uL — ABNORMAL HIGH (ref 4.0–10.5)
nRBC: 0 % (ref 0.0–0.2)

## 2023-06-05 LAB — STREP PNEUMONIAE URINARY ANTIGEN: Strep Pneumo Urinary Antigen: NEGATIVE

## 2023-06-05 LAB — PROTIME-INR
INR: 2 — ABNORMAL HIGH (ref 0.8–1.2)
Prothrombin Time: 22.9 s — ABNORMAL HIGH (ref 11.4–15.2)

## 2023-06-05 LAB — CBG MONITORING, ED
Glucose-Capillary: 75 mg/dL (ref 70–99)
Glucose-Capillary: 143 mg/dL — ABNORMAL HIGH (ref 70–99)

## 2023-06-05 MED ORDER — ROSUVASTATIN CALCIUM 10 MG PO TABS
40.0000 mg | ORAL_TABLET | Freq: Every day | ORAL | Status: DC
  Administered 2023-06-05 – 2023-06-06 (×2): 40 mg via ORAL
  Filled 2023-06-05: qty 4
  Filled 2023-06-05: qty 2

## 2023-06-05 MED ORDER — TORSEMIDE 20 MG PO TABS
20.0000 mg | ORAL_TABLET | ORAL | Status: DC
  Administered 2023-06-05: 20 mg via ORAL
  Filled 2023-06-05: qty 1

## 2023-06-05 MED ORDER — GUAIFENESIN 100 MG/5ML PO LIQD: 5.0000 mL | ORAL | Status: DC | PRN

## 2023-06-05 MED ORDER — DAPAGLIFLOZIN PROPANEDIOL 10 MG PO TABS
10.0000 mg | ORAL_TABLET | Freq: Every day | ORAL | Status: DC
  Administered 2023-06-05 – 2023-06-06 (×2): 10 mg via ORAL
  Filled 2023-06-05 (×2): qty 1

## 2023-06-05 MED ORDER — PRAMIPEXOLE DIHYDROCHLORIDE 0.125 MG PO TABS
0.1250 mg | ORAL_TABLET | Freq: Every day | ORAL | Status: DC
  Administered 2023-06-05: 0.125 mg via ORAL
  Filled 2023-06-05: qty 1

## 2023-06-05 MED ORDER — ACETAMINOPHEN 650 MG RE SUPP: 650.0000 mg | Freq: Four times a day (QID) | RECTAL | Status: DC | PRN

## 2023-06-05 MED ORDER — WARFARIN SODIUM 5 MG PO TABS
7.5000 mg | ORAL_TABLET | Freq: Once | ORAL | Status: AC
  Administered 2023-06-05: 7.5 mg via ORAL
  Filled 2023-06-05: qty 1

## 2023-06-05 MED ORDER — PANTOPRAZOLE SODIUM 40 MG PO TBEC
40.0000 mg | DELAYED_RELEASE_TABLET | Freq: Every day | ORAL | Status: DC
  Administered 2023-06-05 – 2023-06-06 (×2): 40 mg via ORAL
  Filled 2023-06-05 (×2): qty 1

## 2023-06-05 MED ORDER — INSULIN GLARGINE-YFGN 100 UNIT/ML ~~LOC~~ SOLN
25.0000 [IU] | Freq: Every day | SUBCUTANEOUS | Status: DC
  Administered 2023-06-05: 25 [IU] via SUBCUTANEOUS
  Filled 2023-06-05 (×2): qty 0.25

## 2023-06-05 MED ORDER — SODIUM CHLORIDE 0.9 % IV SOLN
2.0000 g | INTRAVENOUS | Status: DC
  Administered 2023-06-05: 2 g via INTRAVENOUS
  Filled 2023-06-05: qty 20

## 2023-06-05 MED ORDER — SODIUM CHLORIDE 0.9% FLUSH
3.0000 mL | Freq: Two times a day (BID) | INTRAVENOUS | Status: DC
  Administered 2023-06-05 – 2023-06-06 (×4): 3 mL via INTRAVENOUS

## 2023-06-05 MED ORDER — INSULIN ASPART 100 UNIT/ML IJ SOLN
0.0000 [IU] | Freq: Every day | INTRAMUSCULAR | Status: DC
  Filled 2023-06-05: qty 0.05

## 2023-06-05 MED ORDER — ACETAMINOPHEN 325 MG PO TABS
650.0000 mg | ORAL_TABLET | Freq: Four times a day (QID) | ORAL | Status: DC | PRN
  Administered 2023-06-05: 650 mg via ORAL
  Filled 2023-06-05: qty 2

## 2023-06-05 MED ORDER — CARVEDILOL 3.125 MG PO TABS
3.1250 mg | ORAL_TABLET | Freq: Two times a day (BID) | ORAL | Status: DC
  Administered 2023-06-05 – 2023-06-06 (×3): 3.125 mg via ORAL
  Filled 2023-06-05 (×3): qty 1

## 2023-06-05 MED ORDER — AZITHROMYCIN 250 MG PO TABS
500.0000 mg | ORAL_TABLET | Freq: Every day | ORAL | Status: DC
  Administered 2023-06-05: 500 mg via ORAL
  Filled 2023-06-05: qty 2

## 2023-06-05 MED ORDER — PROCHLORPERAZINE EDISYLATE 10 MG/2ML IJ SOLN: 5.0000 mg | Freq: Four times a day (QID) | INTRAMUSCULAR | Status: DC | PRN

## 2023-06-05 MED ORDER — INSULIN ASPART 100 UNIT/ML IJ SOLN
0.0000 [IU] | Freq: Three times a day (TID) | INTRAMUSCULAR | Status: DC
  Filled 2023-06-05: qty 0.06

## 2023-06-05 MED ORDER — ORAL CARE MOUTH RINSE
15.0000 mL | OROMUCOSAL | Status: DC
  Administered 2023-06-05 – 2023-06-06 (×3): 15 mL via OROMUCOSAL

## 2023-06-05 MED ORDER — WARFARIN - PHARMACIST DOSING INPATIENT: Freq: Every day | Status: DC

## 2023-06-05 MED ORDER — OXYCODONE HCL 5 MG PO TABS
5.0000 mg | ORAL_TABLET | ORAL | Status: DC | PRN
  Administered 2023-06-05 (×2): 5 mg via ORAL
  Filled 2023-06-05 (×2): qty 1

## 2023-06-05 MED ORDER — SENNOSIDES-DOCUSATE SODIUM 8.6-50 MG PO TABS
1.0000 | ORAL_TABLET | Freq: Every evening | ORAL | Status: DC | PRN
  Administered 2023-06-06: 1 via ORAL
  Filled 2023-06-05: qty 1

## 2023-06-05 MED ORDER — GABAPENTIN 300 MG PO CAPS
300.0000 mg | ORAL_CAPSULE | Freq: Two times a day (BID) | ORAL | Status: DC
  Administered 2023-06-05 – 2023-06-06 (×4): 300 mg via ORAL
  Filled 2023-06-05 (×4): qty 1

## 2023-06-05 NOTE — ED Notes (Signed)
Patient requested coffee. He was given 6 ounces with cream and sugar.

## 2023-06-05 NOTE — Hospital Course (Addendum)
72 year old man PMH including diabetes, CKD, atrial fibrillation on warfarin, presented with shaking chills and general malaise.  Imaging revealed pneumonia.  Admitted for IV antibiotics.  Consultants None  Procedures None

## 2023-06-05 NOTE — Progress Notes (Signed)
PHARMACY - ANTICOAGULATION CONSULT NOTE  Pharmacy Consult for Warfarin Indication: atrial fibrillation  Allergies  Allergen Reactions   Morphine And Codeine Shortness Of Breath and Other (See Comments)    UNSPECIFIED REACTION "Pt said it was too much"    Latex Rash    Patient Measurements:     Vital Signs: Temp: 99.3 F (37.4 C) (12/04 0035) Temp Source: Oral (12/04 0035) BP: 101/71 (12/04 0015) Pulse Rate: 99 (12/04 0300)  Labs: Recent Labs    06/04/23 2046  HGB 15.8  HCT 49.0  PLT 184  APTT 45*  LABPROT 20.7*  INR 1.8*  CREATININE 1.76*    Estimated Creatinine Clearance: 38.9 mL/min (A) (by C-G formula based on SCr of 1.76 mg/dL (H)).   Medical History: Past Medical History:  Diagnosis Date   AKI (acute kidney injury) ([name])    With STEMI in 2017   Chronic systolic CHF (congestive heart failure) ([name])    Depression    Diabetes mellitus without complication ([name])    Diabetic retinopathy ([name])    GERD (gastroesophageal reflux disease)    Hx of adenomatous colonic polyps 04/07/2018   Hyperlipidemia    Hypertension    Hypertensive retinopathy    Paroxysmal atrial fibrillation ([name])    Peripheral vascular disease ([name])    Pneumonia    Seizures ([name])    hx of as a child   STEMI (ST elevation myocardial infarction) ([name]) 2017    Medications:  PTA Warfarin - LD unknown (patient reported daughter helps with meds and med rec to f/u with daughter later this morning) Per anticoag clinic visit on 11/25, INR = 3.3 Patient instructed to hold warfarin x 1 day then continue with warfarin 7.5mg  daily except 5mg  MWF.  Assessment: 72 yr male admitted with pneumonia.  On chronic warfarin therapy for AFib INR (12/3 pm) = 1.8 (slightly subtherapeutic)   Goal of Therapy:  INR 2-3   Plan:  Warfarin 7.5mg  po x 1 today Daily PT/INR  Julizza Sassone, Joselyn Glassman, PharmD 06/05/2023,3:52 AM

## 2023-06-05 NOTE — Plan of Care (Signed)
  Problem: Education: Goal: Ability to describe self-care measures that may prevent or decrease complications (Diabetes Survival Skills Education) will improve Outcome: Progressing Goal: Individualized Educational Video(s) Outcome: Progressing   Problem: Coping: Goal: Ability to adjust to condition or change in health will improve Outcome: Progressing   Problem: Fluid Volume: Goal: Ability to maintain a balanced intake and output will improve Outcome: Progressing   Problem: Health Behavior/Discharge Planning: Goal: Ability to identify and utilize available resources and services will improve Outcome: Progressing Goal: Ability to manage health-related needs will improve Outcome: Progressing   Problem: Metabolic: Goal: Ability to maintain appropriate glucose levels will improve Outcome: Progressing   Problem: Nutritional: Goal: Maintenance of adequate nutrition will improve Outcome: Progressing Goal: Progress toward achieving an optimal weight will improve Outcome: Progressing   Problem: Skin Integrity: Goal: Risk for impaired skin integrity will decrease Outcome: Progressing   Problem: Tissue Perfusion: Goal: Adequacy of tissue perfusion will improve Outcome: Progressing   Problem: Education: Goal: Knowledge of General Education information will improve Description: Including pain rating scale, medication(s)/side effects and non-pharmacologic comfort measures Outcome: Progressing   Problem: Health Behavior/Discharge Planning: Goal: Ability to manage health-related needs will improve Outcome: Progressing   Problem: Clinical Measurements: Goal: Ability to maintain clinical measurements within normal limits will improve Outcome: Progressing Goal: Will remain free from infection Outcome: Progressing Goal: Diagnostic test results will improve Outcome: Progressing Goal: Respiratory complications will improve Outcome: Progressing Goal: Cardiovascular complication will  be avoided Outcome: Progressing   Problem: Activity: Goal: Risk for activity intolerance will decrease Outcome: Progressing   Problem: Nutrition: Goal: Adequate nutrition will be maintained Outcome: Progressing   Problem: Coping: Goal: Level of anxiety will decrease Outcome: Progressing   Problem: Elimination: Goal: Will not experience complications related to bowel motility Outcome: Progressing Goal: Will not experience complications related to urinary retention Outcome: Progressing   Problem: Pain Management: Goal: General experience of comfort will improve Outcome: Progressing   Problem: Safety: Goal: Ability to remain free from injury will improve Outcome: Progressing   Problem: Skin Integrity: Goal: Risk for impaired skin integrity will decrease Outcome: Progressing   Problem: Activity: Goal: Ability to tolerate increased activity will improve Outcome: Progressing   Problem: Clinical Measurements: Goal: Ability to maintain a body temperature in the normal range will improve Outcome: Progressing   Problem: Respiratory: Goal: Ability to maintain adequate ventilation will improve Outcome: Progressing Goal: Ability to maintain a clear airway will improve Outcome: Progressing

## 2023-06-05 NOTE — H&P (Signed)
History and Physical    William [name] XBM:[id number] DOB: 1951/01/31 DOA: 06/04/2023  PCP: [name], MD   Patient coming from: Home   Chief Complaint: Shaking chills, malaise  HPI: William [name] is a 72 y.o. male with medical history significant for hypertension, hyperlipidemia, insulin-dependent diabetes mellitus, CKD 3B, atrial fibrillation on warfarin, CAD, and chronic HFmrEF who presents with acute onset shaking chills and general malaise.   Patient states that he was in his usual state yesterday, ate dinner, and shortly after developed acute onset of shaking chills with mild nausea, fatigue, and general sense of unwellness.  There was no vomiting associated with this.  He had some mild pain across the lower abdomen which he experiences frequently and attributes to constipation.  He states that he has been coughing for roughly a year with no recent worsening.  He denies shortness of breath, denies chest pain, and denies any urinary symptoms.  ED Course: Upon arrival to the ED, patient is found to be febrile 38.3 C and saturating low to mid 90s on room air with tachypnea, tachycardia, and systolic blood pressure 101 and greater.  Labs are most notable for creatinine 1.76, AST 44, ALT 49, normal WBC, normal lactic acid, and INR 1.8.  CT of the abdomen pelvis is negative for acute intra-abdominal or pelvic abnormality but notable for pneumonia involving the lingula and bilateral bases.  Blood cultures were collected in the ED and the patient was treated with 1 L LR, Zofran, Rocephin, and azithromycin.  Review of Systems:  All other systems reviewed and apart from HPI, are negative.  Past Medical History:  Diagnosis Date   AKI (acute kidney injury) ([name])    With STEMI in 2017   Chronic systolic CHF (congestive heart failure) ([name])    Depression    Diabetes mellitus without complication ([name])    Diabetic retinopathy ([name])    GERD (gastroesophageal reflux disease)    Hx of adenomatous  colonic polyps 04/07/2018   Hyperlipidemia    Hypertension    Hypertensive retinopathy    Paroxysmal atrial fibrillation ([name])    Peripheral vascular disease ([name])    Pneumonia    Seizures ([name])    hx of as a child   STEMI (ST elevation myocardial infarction) ([name]) 2017    Past Surgical History:  Procedure Laterality Date   ABDOMINAL AORTOGRAM W/LOWER EXTREMITY N/A 09/19/2016   Procedure: Abdominal Aortogram w/Lower Extremity;  Surgeon: Iran Ouch, MD;  Location: [facility] INVASIVE CV LAB;  Service: Cardiovascular;  Laterality: N/A;   AMPUTATION Right 03/23/2016   Procedure: 1st and 2nd Ray Amputation Right Foot;  Surgeon: Nadara Mustard, MD;  Location: [facility] OR;  Service: Orthopedics;  Laterality: Right;   AMPUTATION Right 06/21/2016   Procedure: RIGHT TRANSMETATARSAL AMPUTATION;  Surgeon: Nadara Mustard, MD;  Location: [facility] OR;  Service: Orthopedics;  Laterality: Right;   CARDIAC CATHETERIZATION N/A 03/13/2016   Procedure: Right/Left Heart Cath and Coronary Angiography;  Surgeon: Tonny Bollman, MD;  Location: [facility] INVASIVE CV LAB;  Service: Cardiovascular;  Laterality: N/A;   CARDIAC CATHETERIZATION N/A 03/13/2016   Procedure: IABP Insertion;  Surgeon: Tonny Bollman, MD;  Location: [facility] INVASIVE CV LAB;  Service: Cardiovascular;  Laterality: N/A;   CATARACT EXTRACTION     CATARACT EXTRACTION, BILATERAL     CERVICAL FUSION  1982, 1992   has had 3 neck surgeries from breaking his neck   CIRCUMCISION N/A 11/07/2021   Procedure: CIRCUMCISION ADULT;  Surgeon: Despina Arias, MD;  Location: [facility] ORS;  Service: Urology;  Laterality: N/A;   CORONARY ARTERY BYPASS GRAFT N/A 03/13/2016   Procedure: CORONARY ARTERY BYPASS GRAFTING (CABG) x 1 (SVG to OM) with EVH from LEFT GREATER SAPHENOUS VEIN;  Surgeon: Kerin Perna, MD;  Location: [facility] OR;  Service: Open Heart Surgery;  Laterality: N/A;   EYE SURGERY     LOWER EXTREMITY ANGIOGRAM  05/02/2016   Procedure: Lower Extremity Angiogram;  Surgeon: Iran Ouch, MD;  Location: [facility] INVASIVE CV LAB;  Service: Cardiovascular;;  Limited left femoral runoff right femoral runoff   PERIPHERAL VASCULAR CATHETERIZATION N/A 05/02/2016   Procedure: Abdominal Aortogram;  Surgeon: Iran Ouch, MD;  Location: [facility] CV LAB;  Service: Cardiovascular;  Laterality: N/A;   PERIPHERAL VASCULAR CATHETERIZATION Right 05/02/2016   Procedure: Peripheral Vascular Balloon Angioplasty;  Surgeon: Iran Ouch, MD;  Location: [facility]VAS[facility] CV LAB;  Service: Cardiovascular;  Laterality: Right;  SFA   TEE WITHOUT CARDIOVERSION N/A 03/13/2016   Procedure: TRANSESOPHAGEAL ECHOCARDIOGRAM (TEE);  Surgeon: Kerin Perna, MD;  Location: [facility] OR;  Service: Open Heart Surgery;  Laterality: N/A;   VSD REPAIR N/A 03/13/2016   Procedure: VENTRICULAR SEPTAL DEFECT (VSD) REPAIR;  Surgeon: Kerin Perna, MD;  Location: [facility] OR;  Service: Open Heart Surgery;  Laterality: N/A;    Social History:   reports that he quit smoking about 7 years ago. His smoking use included cigarettes. He has never used smokeless tobacco. He reports that he does not drink alcohol and does not use drugs.  Allergies  Allergen Reactions   Morphine And Codeine Shortness Of Breath and Other (See Comments)    UNSPECIFIED REACTION "Pt said it was too much"    Latex Rash    Family History  Problem Relation Age of Onset   Diabetes Maternal Grandmother    Diabetes Mother    Aneurysm Mother    Peripheral Artery Disease Mother    Coronary artery disease Mother    Peptic Ulcer Father    Retinoblastoma Daughter    Colon cancer Neg Hx    Rectal cancer Neg Hx      Prior to Admission medications   Medication Sig Start Date End Date Taking? Authorizing Provider  dapagliflozin propanediol (FARXIGA) 10 MG TABS tablet TAKE 1 TABLET(10 MG) BY MOUTH DAILY 05/16/23  Yes [name], MD  acetaminophen (TYLENOL) 500 MG tablet Take 500 mg by mouth every 6 (six) hours as needed for moderate pain.     [name], MD  carvedilol (COREG) 3.125 MG tablet TAKE 1 TABLET(3.125 MG) BY MOUTH TWICE DAILY WITH A MEAL 02/24/23   [name], MD  Robyne Askew Paint 1.5 % LIQD Apply 1 application  topically 2 (two) times daily. 11/19/22   Felecia Shelling, DPM  Continuous Glucose Receiver (DEXCOM G7 RECEIVER) DEVI 1 Act by Does not apply route daily. 04/29/23   [name], MD  Continuous Glucose Sensor (DEXCOM G7 SENSOR) MISC 1 Act by Does not apply route daily. 04/29/23   [name], MD  Continuous Glucose Sensor (FREESTYLE LIBRE 2 SENSOR) MISC APPLY A NEW SENSOR EVERY 14 DAYS Patient not taking: Reported on 05/23/2023 12/30/22   [name], MD  gabapentin (NEURONTIN) 300 MG capsule TAKE 1 CAPSULE(300 MG) BY MOUTH TWICE DAILY 05/13/23   [name], MD  Glucagon (GVOKE HYPOPEN 2-PACK) 1 MG/0.2ML SOAJ Inject 1 Act into the skin daily as needed. 05/07/22   [name], MD  HUMALOG KWIKPEN 100 UNIT/ML KwikPen INJECT 10 UNITS UNDER THE SKIN THREE TIMES DAILY AS DIRECTED BY SLIDING SCALE Patient not taking: Reported on 06/05/2023 10/14/21   [name], MD  insulin glargine (LANTUS SOLOSTAR) 100 UNIT/ML Solostar Pen Inject 60 Units into the skin daily. Patient taking differently: Inject 55 Units into the skin at bedtime. 05/20/23   [name], MD  Insulin Pen Needle 31G X 8 MM MISC 1 each by Does not apply route 4 (four) times daily. 04/29/23   [name], MD  pantoprazole (PROTONIX) 40 MG tablet TAKE 1 TABLET(40 MG) BY MOUTH TWICE DAILY BEFORE A MEAL 06/02/23   [name], MD  pramipexole (MIRAPEX) 0.125 MG tablet TAKE 1 TABLET(0.125 MG) BY MOUTH AT BEDTIME 02/18/23   [name], MD  rosuvastatin (CRESTOR) 40 MG tablet TAKE 1 TABLET BY MOUTH EVERY DAY 02/03/23   [name], MD  silver sulfADIAZINE (SILVADENE) 1 % cream Apply 1 Application topically daily. 11/19/22   Felecia Shelling, DPM  torsemide (DEMADEX) 20 MG tablet Take 1 tablet (20 mg total) by mouth every  other day. TAKE 1 TABLET(20 MG) BY MOUTH 3 TIMES A WEEK Monday, Wednesday and Friday 05/15/23   [name], MD  VASCEPA 1 g capsule TAKE 2 CAPSULES(2 GRAMS) BY MOUTH TWICE DAILY 02/12/23   [name], MD  warfarin (COUMADIN) 5 MG tablet Take 1 to 1 &1/2  by mouth daily as directed by the coumadin clinic Patient taking differently: Take 1 to 1 &1/2  by mouth daily as directed by the coumadin clinic Take 7.5 mg by mouth daily (1.5 tablet of 5 mg) on Tues, Thurs, Sat and Sun. Takes 5 mg (one 5 mg tablet) on Mon, Wed and Fri. 09/10/22   Iran Ouch, MD  warfarin (COUMADIN) 5 MG tablet Take 5 mg by mouth daily. Patient not taking: Reported on 05/23/2023    [provider]    Physical Exam: Vitals:   06/04/23 2319 06/05/23 0015 06/05/23 0035 06/05/23 0300  BP:  101/71    Pulse:  (!) 117  99  Resp:  20  (!) 27  Temp: (!) 100.9 F (38.3 C)  99.3 F (37.4 C)   TempSrc:   Oral   SpO2:  96%  96%    Constitutional: NAD, no diaphoresis   Eyes: PERTLA, lids and conjunctivae normal ENMT: Mucous membranes are moist. Posterior pharynx clear of any exudate or lesions.   Neck: supple, no masses  Respiratory: Rhonchi at left base, no wheezing, no crackles. No accessory muscle use.  Cardiovascular: S1 & S2 heard, regular rate and rhythm. No extremity edema.   Abdomen: Soft, non-tender. Bowel sounds active.  Musculoskeletal: no clubbing / cyanosis. No joint deformity upper and lower extremities.   Skin: no significant rashes, lesions, ulcers. Warm, dry, well-perfused. Neurologic: CN 2-12 grossly intact. Moving all extremities. Sleeping, wakes to voice and oriented to person, place, and situation.  Psychiatric: Calm. Cooperative.    Labs and Imaging on Admission: I have personally reviewed following labs and imaging studies  CBC: Recent Labs  Lab 06/04/23 2046  WBC 9.6  NEUTROABS 8.1*  HGB 15.8  HCT 49.0  MCV 90.1  PLT 184   Basic Metabolic Panel: Recent Labs  Lab  06/04/23 2046  NA 139  K 4.3  CL 102  CO2 25  GLUCOSE 84  BUN 38*  CREATININE 1.76*  CALCIUM 9.8   GFR: Estimated Creatinine Clearance: 38.9 mL/min (A) (  by C-G formula based on SCr of 1.76 mg/dL (H)). Liver Function Tests: Recent Labs  Lab 06/04/23 2046  AST 44*  ALT 49*  ALKPHOS 121  BILITOT 1.1  PROT 8.7*  ALBUMIN 5.1*   No results for input(s): "LIPASE", "AMYLASE" in the last 168 hours. No results for input(s): "AMMONIA" in the last 168 hours. Coagulation Profile: Recent Labs  Lab 06/04/23 2046  INR 1.8*   Cardiac Enzymes: No results for input(s): "CKTOTAL", "CKMB", "CKMBINDEX", "TROPONINI" in the last 168 hours. BNP (last 3 results) No results for input(s): "PROBNP" in the last 8760 hours. HbA1C: No results for input(s): "HGBA1C" in the last 72 hours. CBG: Recent Labs  Lab 06/04/23 2114  GLUCAP 76   Lipid Profile: No results for input(s): "CHOL", "HDL", "LDLCALC", "TRIG", "CHOLHDL", "LDLDIRECT" in the last 72 hours. Thyroid Function Tests: No results for input(s): "TSH", "T4TOTAL", "FREET4", "T3FREE", "THYROIDAB" in the last 72 hours. Anemia Panel: No results for input(s): "VITAMINB12", "FOLATE", "FERRITIN", "TIBC", "IRON", "RETICCTPCT" in the last 72 hours. Urine analysis:    Component Value Date/Time   COLORURINE YELLOW 06/04/2023 2203   APPEARANCEUR CLEAR 06/04/2023 2203   LABSPEC 1.022 06/04/2023 2203   PHURINE 5.0 06/04/2023 2203   GLUCOSEU >=500 (A) 06/04/2023 2203   GLUCOSEU >=1000 (A) 04/29/2023 1052   HGBUR NEGATIVE 06/04/2023 2203   BILIRUBINUR NEGATIVE 06/04/2023 2203   BILIRUBINUR negative 09/29/2018 1101   BILIRUBINUR neg 06/16/2018 1402   KETONESUR 5 (A) 06/04/2023 2203   PROTEINUR 30 (A) 06/04/2023 2203   UROBILINOGEN 1.0 04/29/2023 1052   NITRITE NEGATIVE 06/04/2023 2203   LEUKOCYTESUR NEGATIVE 06/04/2023 2203   Sepsis Labs: @LABRCNTIP (procalcitonin:4,lacticidven:4) )No results found for this or any previous visit (from the  past 240 hour(s)).   Radiological Exams on Admission: CT ABDOMEN PELVIS W CONTRAST  Result Date: 06/04/2023 CLINICAL DATA:  Left lower quadrant pain EXAM: CT ABDOMEN AND PELVIS WITH CONTRAST TECHNIQUE: Multidetector CT imaging of the abdomen and pelvis was performed using the standard protocol following bolus administration of intravenous contrast. RADIATION DOSE REDUCTION: This exam was performed according to the departmental dose-optimization program which includes automated exposure control, adjustment of the mA and/or kV according to patient size and/or use of iterative reconstruction technique. CONTRAST:  80mL OMNIPAQUE IOHEXOL 300 MG/ML  SOLN COMPARISON:  CT 05/13/2023 FINDINGS: Lower chest: Lung bases demonstrate no pleural effusion. Central lobular ground-glass disease in the lingula in left lower lobe and to a lesser extent in the right lung base, consistent with pneumonia. Mild cardiomegaly. Coronary vascular calcification Hepatobiliary: Gallstones. No focal hepatic abnormality or biliary dilatation Pancreas: Unremarkable. No pancreatic ductal dilatation or surrounding inflammatory changes. Spleen: Slightly enlarged measuring 15 cm AP Adrenals/Urinary Tract: Adrenal glands are normal. Kidneys show no hydronephrosis. Small left renal cysts for which no imaging follow-up is recommended. The bladder is unremarkable Stomach/Bowel: The stomach is nonenlarged. There is no dilated small bowel. No acute bowel wall thickening. Negative appendix. Vascular/Lymphatic: Moderate aortic atherosclerosis. No aneurysm. No suspicious lymph nodes Reproductive: Enlarged prostate Other: Negative for pelvic effusion or free air. Musculoskeletal: No acute or suspicious osseous abnormality. Multilevel degenerative changes IMPRESSION: 1. No CT evidence for acute intra-abdominal or pelvic abnormality. 2. Findings consistent with pneumonia involving the lingula and left greater than right lower lobes. 3. Gallstones. 4. Mild  splenomegaly. 5. Enlarged prostate. Aortic Atherosclerosis (ICD10-I70.0). Electronically Signed   By: Jasmine Pang M.D.   On: 06/04/2023 22:41   DG Chest Port 1 View  Result Date: 06/04/2023 CLINICAL DATA:  Chills  tremors EXAM: PORTABLE CHEST 1 VIEW COMPARISON:  04/16/2016 FINDINGS: Post sternotomy changes. Cardiomegaly with slight central congestion. Patchy airspace opacities in the left mid lung and bilateral bases. Aortic atherosclerosis. No pneumothorax IMPRESSION: Cardiomegaly with slight central congestion. Patchy airspace opacities in the left mid lung and bilateral bases, suspicious for pneumonia Electronically Signed   By: Jasmine Pang M.D.   On: 06/04/2023 21:47    EKG: Independently reviewed. Sinus tachycardia, rate 119, non-specific IVCD.   Assessment/Plan   1. Pneumonia  - Blood cultures collected in ED and Rocephin and azithromycin started  - Check strep pneumo and legionella antigens, continue Rocephin and azithromycin, follow cultures and clinical course    2. Chronic HFmrEF  - EF was 45-50% on most recent echo from 2018  - Appears compensated  - Continue torsemide, monitor volume status    3. Atrial fibrillation  - In SR in ED  - Continue warfarin with pharmacy assistance    4. Insulin-dependent DM  - A1c was 8.0% in October 2024  - Check CBGs and use long- and short-acting insulin    5. CKD 3B  - Appears close to baseline  - Renally-dose medications   6. CAD  - No anginal symptoms  - Continue statin and beta-blocker   7. Elevated transaminases  - Mild elevation in transaminases noted on admission  - Abdominal exam benign and no acute findings on CT in ED  - Trend     DVT prophylaxis: warfarin  Code Status: Full  Level of Care: Level of care: Progressive Family Communication: Wife updated from ED  Disposition Plan:  Patient is from: home  Anticipated d/c is to: TBD Anticipated d/c date is: 06/07/23  Patient currently: Pending cultures, clinical  stability  Consults called: None  Admission status: Inpatient     Briscoe Deutscher, MD [facility]  06/05/2023, 4:08 AM

## 2023-06-05 NOTE — ED Notes (Signed)
ED TO INPATIENT HANDOFF REPORT  Name/Age/Gender William [name] 72 y.o. male  Code Status    Code Status Orders  (From admission, onward)           Start     Ordered   06/05/23 0331  Full code  Continuous       Question:  By:  Answer:  Consent: discussion documented in EHR   06/05/23 0332           Code Status History     Date Active Date Inactive Code Status Order ID Comments User Context   09/19/2016 1239 09/19/2016 2013 Full Code [id number]  Iran Ouch, MD Inpatient   06/21/2016 0419 06/23/2016 1920 Full Code [id number]  Alberteen Sam, MD Inpatient   05/02/2016 1314 05/02/2016 2151 Full Code [id number]  Iran Ouch, MD Inpatient   03/13/2016 2025 04/03/2016 1341 Full Code [id number]  Ardelle Balls, PA-C Inpatient       Home/SNF/Other Home  Chief Complaint Sepsis due to pneumonia ([name]) [J18.9, A41.9] Lobar pneumonia ([name]) [J18.1]  Level of Care/Admitting Diagnosis ED Disposition     ED Disposition  Admit   Condition  --   Comment  Hospital Area: [facility] [[id number]]  Level of Care: Med-Surg [16]  May admit patient to [facility] or [facility] if equivalent level of care is available:: Yes  Covid Evaluation: Asymptomatic - no recent exposure (last 10 days) testing not required  Diagnosis: Lobar pneumonia [facility]) [[id number]]  Admitting Physician: Standley Brooking [[id number]]  Attending Physician: Standley Brooking [[id number]]  Certification:: I certify this patient will need inpatient services for at least 2 midnights          Medical History Past Medical History:  Diagnosis Date   AKI (acute kidney injury) ([name])    With STEMI in 2017   Chronic systolic CHF (congestive heart failure) ([name])    Depression    Diabetes mellitus without complication ([name])    Diabetic retinopathy ([name])    GERD (gastroesophageal reflux disease)    Hx of adenomatous colonic polyps 04/07/2018   Hyperlipidemia    Hypertension    Hypertensive  retinopathy    Paroxysmal atrial fibrillation ([name])    Peripheral vascular disease ([name])    Pneumonia    Seizures ([name])    hx of as a child   STEMI (ST elevation myocardial infarction) ([name]) 2017    Allergies Allergies  Allergen Reactions   Morphine And Codeine Shortness Of Breath and Other (See Comments)    UNSPECIFIED REACTION "Pt said it was too much"    Latex Rash    IV Location/Drains/Wounds Patient Lines/Drains/Airways Status     Active Line/Drains/Airways     Name Placement date Placement time Site Days   Peripheral IV 06/04/23 20 G Right;Distal Antecubital 06/04/23  2045  Antecubital  1            Labs/Imaging Results for orders placed or performed during the hospital encounter of 06/04/23 (from the past 48 hour(s))  Comprehensive metabolic panel     Status: Abnormal   Collection Time: 06/04/23  8:46 PM  Result Value Ref Range   Sodium 139 135 - 145 mmol/L   Potassium 4.3 3.5 - 5.1 mmol/L   Chloride 102 98 - 111 mmol/L   CO2 25 22 - 32 mmol/L   Glucose, Bld 84 70 - 99 mg/dL    Comment: Glucose reference range applies only to samples taken after fasting for at least 8  hours.   BUN 38 (H) 8 - 23 mg/dL   Creatinine, Ser 4.09 (H) 0.61 - 1.24 mg/dL   Calcium 9.8 8.9 - 81.1 mg/dL   Total Protein 8.7 (H) 6.5 - 8.1 g/dL   Albumin 5.1 (H) 3.5 - 5.0 g/dL   AST 44 (H) 15 - 41 U/L   ALT 49 (H) 0 - 44 U/L   Alkaline Phosphatase 121 38 - 126 U/L   Total Bilirubin 1.1 <1.2 mg/dL   GFR, Estimated 41 (L) >60 mL/min    Comment: (NOTE) Calculated using the CKD-EPI Creatinine Equation (2021)    Anion gap 12 5 - 15    Comment: Performed at [facility], [address] [place], [place] [address]  CBC with Differential     Status: Abnormal   Collection Time: 06/04/23  8:46 PM  Result Value Ref Range   WBC 9.6 4.0 - 10.5 K/uL   RBC 5.44 4.22 - 5.81 MIL/uL   Hemoglobin 15.8 13.0 - 17.0 g/dL   HCT 29.5 62.1 - 30.8 %   MCV 90.1 80.0 - 100.0 fL   MCH  29.0 26.0 - 34.0 pg   MCHC 32.2 30.0 - 36.0 g/dL   RDW 65.7 84.6 - 96.2 %   Platelets 184 150 - 400 K/uL   nRBC 0.0 0.0 - 0.2 %   Neutrophils Relative % 84 %   Neutro Abs 8.1 (H) 1.7 - 7.7 K/uL   Lymphocytes Relative 11 %   Lymphs Abs 1.1 0.7 - 4.0 K/uL   Monocytes Relative 4 %   Monocytes Absolute 0.4 0.1 - 1.0 K/uL   Eosinophils Relative 1 %   Eosinophils Absolute 0.1 0.0 - 0.5 K/uL   Basophils Relative 0 %   Basophils Absolute 0.0 0.0 - 0.1 K/uL   Immature Granulocytes 0 %   Abs Immature Granulocytes 0.02 0.00 - 0.07 K/uL    Comment: Performed at [facility], [address], [place], [place] [address]  Protime-INR     Status: Abnormal   Collection Time: 06/04/23  8:46 PM  Result Value Ref Range   Prothrombin Time 20.7 (H) 11.4 - 15.2 seconds   INR 1.8 (H) 0.8 - 1.2    Comment: (NOTE) INR goal varies based on device and disease states. Performed at [facility], [address] [place], [place] [address]   APTT     Status: Abnormal   Collection Time: 06/04/23  8:46 PM  Result Value Ref Range   aPTT 45 (H) 24 - 36 seconds    Comment:        IF BASELINE aPTT IS ELEVATED, SUGGEST PATIENT RISK ASSESSMENT BE USED TO DETERMINE APPROPRIATE ANTICOAGULANT THERAPY. Performed at [facility], [address], [place], [place] [address]   Blood Culture (routine x 2)     Status: None (Preliminary result)   Collection Time: 06/04/23  8:47 PM   Specimen: BLOOD  Result Value Ref Range   Specimen Description      BLOOD RIGHT ANTECUBITAL Performed at [facility], [address], [place], [place] [address]    Special Requests      BOTTLES DRAWN AEROBIC AND ANAEROBIC Blood Culture results may not be optimal due to an inadequate volume of blood received in culture bottles Performed at [facility], [address] [place], [place] [address]    Culture      NO GROWTH < 12 HOURS Performed  at [facility], [address] [place], [place] [address]    Report Status PENDING   CBG monitoring, ED     Status: None   Collection Time: 06/04/23  9:14 PM  Result Value Ref Range   Glucose-Capillary 76 70 - 99 mg/dL    Comment: Glucose reference range applies only to samples taken after fasting for at least 8 hours.  Blood Culture (routine x 2)     Status: None (Preliminary result)   Collection Time: 06/04/23  9:22 PM   Specimen: BLOOD  Result Value Ref Range   Specimen Description      BLOOD BLOOD LEFT ARM Performed at [facility], [name], [place], [place] [address]    Special Requests      BOTTLES DRAWN AEROBIC AND ANAEROBIC Blood Culture results may not be optimal due to an inadequate volume of blood received in culture bottles Performed at [facility], [address] [place], [place] [address]    Culture      NO GROWTH < 12 HOURS Performed at [facility], [address] [place], [place] [address]    Report Status PENDING   I-Stat Lactic Acid, ED     Status: None   Collection Time: 06/04/23  9:30 PM  Result Value Ref Range   Lactic Acid, Venous 1.3 0.5 - 1.9 mmol/L  Urinalysis, w/ Reflex to Culture (Infection Suspected) -Urine, Clean Catch     Status: Abnormal   Collection Time: 06/04/23 10:03 PM  Result Value Ref Range   Specimen Source URINE, CLEAN CATCH    Color, Urine YELLOW YELLOW   APPearance CLEAR CLEAR   Specific Gravity, Urine 1.022 1.005 - 1.030   pH 5.0 5.0 - 8.0   Glucose, UA >=500 (A) NEGATIVE mg/dL   Hgb urine dipstick NEGATIVE NEGATIVE   Bilirubin Urine NEGATIVE NEGATIVE   Ketones, ur 5 (A) NEGATIVE mg/dL   Protein, ur 30 (A) NEGATIVE mg/dL   Nitrite NEGATIVE NEGATIVE   Leukocytes,Ua NEGATIVE NEGATIVE   RBC / HPF 0-5 0 - 5 RBC/hpf   WBC, UA 0-5 0 - 5 WBC/hpf    Comment:        Reflex urine culture not performed if WBC <=10, OR if Squamous epithelial cells >5. If Squamous epithelial  cells >5 suggest recollection.    Bacteria, UA NONE SEEN NONE SEEN   Squamous Epithelial / HPF 0-5 0 - 5 /HPF    Comment: Performed at [facility], [address], [place], [place] [address]  Strep pneumoniae urinary antigen     Status: None   Collection Time: 06/04/23 10:03 PM  Result Value Ref Range   Strep Pneumo Urinary Antigen NEGATIVE NEGATIVE    Comment:        Infection due to S. pneumoniae cannot be absolutely ruled out since the antigen present may be below the detection limit of the test. Performed at [facility], [address] [place], [place] [address]   Comprehensive metabolic panel     Status: Abnormal   Collection Time: 06/05/23  5:09 AM  Result Value Ref Range   Sodium 138 135 - 145 mmol/L   Potassium 4.0 3.5 - 5.1 mmol/L   Chloride 107 98 - 111 mmol/L   CO2 22 22 - 32 mmol/L   Glucose, Bld 86 70 - 99 mg/dL    Comment: Glucose reference range applies only to samples taken after fasting for at least 8 hours.  BUN 40 (H) 8 - 23 mg/dL   Creatinine, Ser 1.61 (H) 0.61 - 1.24 mg/dL   Calcium 8.7 (L) 8.9 - 10.3 mg/dL   Total Protein 6.2 (L) 6.5 - 8.1 g/dL   Albumin 3.5 3.5 - 5.0 g/dL   AST 42 (H) 15 - 41 U/L   ALT 47 (H) 0 - 44 U/L   Alkaline Phosphatase 88 38 - 126 U/L   Total Bilirubin 0.8 <1.2 mg/dL   GFR, Estimated 41 (L) >60 mL/min    Comment: (NOTE) Calculated using the CKD-EPI Creatinine Equation (2021)    Anion gap 9 5 - 15    Comment: Performed at [facility], [address] [place], [place] [address]  CBC     Status: Abnormal   Collection Time: 06/05/23  5:09 AM  Result Value Ref Range   WBC 18.1 (H) 4.0 - 10.5 K/uL   RBC 4.01 (L) 4.22 - 5.81 MIL/uL   Hemoglobin 11.9 (L) 13.0 - 17.0 g/dL   HCT 54.0 (L) 98.1 - 19.1 %   MCV 88.5 80.0 - 100.0 fL   MCH 29.7 26.0 - 34.0 pg   MCHC 33.5 30.0 - 36.0 g/dL   RDW 47.8 29.5 - 62.1 %   Platelets 138 (L) 150 - 400 K/uL   nRBC 0.0 0.0 - 0.2 %    Comment:  Performed at [facility], [address] [place], [place] [address]  Protime-INR     Status: Abnormal   Collection Time: 06/05/23  5:09 AM  Result Value Ref Range   Prothrombin Time 22.9 (H) 11.4 - 15.2 seconds   INR 2.0 (H) 0.8 - 1.2    Comment: (NOTE) INR goal varies based on device and disease states. Performed at [facility], [address] [place], [place] [address]   CBG monitoring, ED     Status: None   Collection Time: 06/05/23  8:14 AM  Result Value Ref Range   Glucose-Capillary 75 70 - 99 mg/dL    Comment: Glucose reference range applies only to samples taken after fasting for at least 8 hours.  CBG monitoring, ED     Status: Abnormal   Collection Time: 06/05/23 11:48 AM  Result Value Ref Range   Glucose-Capillary 143 (H) 70 - 99 mg/dL    Comment: Glucose reference range applies only to samples taken after fasting for at least 8 hours.   CT ABDOMEN PELVIS W CONTRAST  Result Date: 06/04/2023 CLINICAL DATA:  Left lower quadrant pain EXAM: CT ABDOMEN AND PELVIS WITH CONTRAST TECHNIQUE: Multidetector CT imaging of the abdomen and pelvis was performed using the standard protocol following bolus administration of intravenous contrast. RADIATION DOSE REDUCTION: This exam was performed according to the departmental dose-optimization program which includes automated exposure control, adjustment of the mA and/or kV according to patient size and/or use of iterative reconstruction technique. CONTRAST:  80mL OMNIPAQUE IOHEXOL 300 MG/ML  SOLN COMPARISON:  CT 05/13/2023 FINDINGS: Lower chest: Lung bases demonstrate no pleural effusion. Central lobular ground-glass disease in the lingula in left lower lobe and to a lesser extent in the right lung base, consistent with pneumonia. Mild cardiomegaly. Coronary vascular calcification Hepatobiliary: Gallstones. No focal hepatic abnormality or biliary dilatation Pancreas: Unremarkable. No pancreatic ductal  dilatation or surrounding inflammatory changes. Spleen: Slightly enlarged measuring 15 cm AP Adrenals/Urinary Tract: Adrenal glands are normal. Kidneys show no hydronephrosis. Small left renal cysts for which no imaging follow-up is recommended. The bladder is unremarkable Stomach/Bowel: The  stomach is nonenlarged. There is no dilated small bowel. No acute bowel wall thickening. Negative appendix. Vascular/Lymphatic: Moderate aortic atherosclerosis. No aneurysm. No suspicious lymph nodes Reproductive: Enlarged prostate Other: Negative for pelvic effusion or free air. Musculoskeletal: No acute or suspicious osseous abnormality. Multilevel degenerative changes IMPRESSION: 1. No CT evidence for acute intra-abdominal or pelvic abnormality. 2. Findings consistent with pneumonia involving the lingula and left greater than right lower lobes. 3. Gallstones. 4. Mild splenomegaly. 5. Enlarged prostate. Aortic Atherosclerosis (ICD10-I70.0). Electronically Signed   By: Jasmine Pang M.D.   On: 06/04/2023 22:41   DG Chest Port 1 View  Result Date: 06/04/2023 CLINICAL DATA:  Chills tremors EXAM: PORTABLE CHEST 1 VIEW COMPARISON:  04/16/2016 FINDINGS: Post sternotomy changes. Cardiomegaly with slight central congestion. Patchy airspace opacities in the left mid lung and bilateral bases. Aortic atherosclerosis. No pneumothorax IMPRESSION: Cardiomegaly with slight central congestion. Patchy airspace opacities in the left mid lung and bilateral bases, suspicious for pneumonia Electronically Signed   By: Jasmine Pang M.D.   On: 06/04/2023 21:47    Pending Labs Unresulted Labs (From admission, onward)     Start     Ordered   06/05/23 0500  Comprehensive metabolic panel  Daily,   R      06/05/23 0332   06/05/23 0500  CBC  Daily,   R      06/05/23 0332   06/05/23 0500  Legionella Pneumophila Serogp 1 Ur Ag  (COPD / Pneumonia / Cellulitis / Lower Extremity Wound)  Tomorrow morning,   R        06/05/23 0332   06/05/23 0500   Protime-INR  Daily,   R      06/05/23 0344            Vitals/Pain Today's Vitals   06/05/23 0539 06/05/23 0800 06/05/23 0929 06/05/23 1230  BP:  122/64  (!) 128/57  Pulse:  95  88  Resp:  18  20  Temp: 98.7 F (37.1 C)  99.4 F (37.4 C)   TempSrc: Oral  Oral   SpO2:  94%  94%  PainSc:        Isolation Precautions No active isolations  Medications Medications  carvedilol (COREG) tablet 3.125 mg (3.125 mg Oral Given 06/05/23 0807)  rosuvastatin (CRESTOR) tablet 40 mg (40 mg Oral Given by Other 06/05/23 1012)  torsemide (DEMADEX) tablet 20 mg (20 mg Oral Given by Other 06/05/23 1011)  pantoprazole (PROTONIX) EC tablet 40 mg (40 mg Oral Given 06/05/23 1011)  gabapentin (NEURONTIN) capsule 300 mg (300 mg Oral Given 06/05/23 1011)  pramipexole (MIRAPEX) tablet 0.125 mg (has no administration in time range)  insulin glargine-yfgn (SEMGLEE) injection 25 Units (has no administration in time range)  insulin aspart (novoLOG) injection 0-6 Units ( Subcutaneous Not Given 06/05/23 1149)  insulin aspart (novoLOG) injection 0-5 Units (has no administration in time range)  sodium chloride flush (NS) 0.9 % injection 3 mL (3 mLs Intravenous Given 06/05/23 1012)  acetaminophen (TYLENOL) tablet 650 mg (has no administration in time range)    Or  acetaminophen (TYLENOL) suppository 650 mg (has no administration in time range)  oxyCODONE (Oxy IR/ROXICODONE) immediate release tablet 5 mg (5 mg Oral Given 06/05/23 0407)  senna-docusate (Senokot-S) tablet 1 tablet (has no administration in time range)  prochlorperazine (COMPAZINE) injection 5 mg (has no administration in time range)  cefTRIAXone (ROCEPHIN) 2 g in sodium chloride 0.9 % 100 mL IVPB (has no administration in time range)  azithromycin (ZITHROMAX) tablet 500  mg (has no administration in time range)  Warfarin - Pharmacist Dosing Inpatient ( Does not apply Canceled Entry 06/05/23 1600)  warfarin (COUMADIN) tablet 7.5 mg (has no administration  in time range)  guaiFENesin (ROBITUSSIN) 100 MG/5ML liquid 5 mL (has no administration in time range)  dapagliflozin propanediol (FARXIGA) tablet 10 mg (has no administration in time range)  ondansetron (ZOFRAN) injection 4 mg (4 mg Intravenous Given 06/04/23 2315)  lactated ringers bolus 1,000 mL (0 mLs Intravenous Stopped 06/05/23 0021)  iohexol (OMNIPAQUE) 300 MG/ML solution 80 mL (80 mLs Intravenous Contrast Given 06/04/23 2215)  cefTRIAXone (ROCEPHIN) 1 g in sodium chloride 0.9 % 100 mL IVPB (0 g Intravenous Stopped 06/05/23 0021)  azithromycin (ZITHROMAX) tablet 500 mg (500 mg Oral Given 06/04/23 2314)    Mobility walks

## 2023-06-05 NOTE — Progress Notes (Signed)
  Progress Note   Patient: William [name] VOZ:[id number] DOB: Jul 10, 1950 DOA: 06/04/2023     0 DOS: the patient was seen and examined on 06/05/2023   Brief hospital course: 72 year old man PMH including diabetes, CKD, atrial fibrillation on warfarin, presented with shaking chills and general malaise.  Imaging revealed pneumonia.  Admitted for IV antibiotics.  Consultants None  Procedures None  Assessment and Plan: Lobar pneumonia Afebrile, no hypoxia. Strep pneumo Neri antigen negative.  Follow-up Legionella antigen. Continue ceftriaxone and azithromycin. If continues to improve, likely discharge tomorrow. Can downgrade to medical bed   Chronic HFmrEF  EF was 45-50% on most recent echo from 2018  Appears compensated  Continue torsemide, monitor volume status     Atrial fibrillation  In SR in ED  Continue warfarin with pharmacy assistance     Insulin-dependent DM  A1c was 8.0% in October 2024  Check CBGs and use long- and short-acting insulin     CKD 3B  Appears close to baseline    CAD  Asymptomatic, continue statin and beta-blocker   Elevated transaminases  Mild elevation in transaminases noted on admission  Abdominal exam benign and no acute findings on CT in ED  May be fatty liver not seen on imaging.  Follow-up as an outpatient.  Mild splenomegaly seen on CT.  Follow-up as an outpatient.   Aortic Atherosclerosis (ICD10-I70.0) Follow-up as an outpatient     Subjective:  Feels better Breathing fine No complaints  Physical Exam: Vitals:   06/05/23 0530 06/05/23 0539 06/05/23 0800 06/05/23 0929  BP: 122/62  122/64   Pulse: 94  95   Resp: 20  18   Temp:  98.7 F (37.1 C)  99.4 F (37.4 C)  TempSrc:  Oral  Oral  SpO2: 95%  94%    Physical Exam Vitals reviewed.  Constitutional:      General: He is not in acute distress.    Appearance: He is not ill-appearing or toxic-appearing.  Cardiovascular:     Rate and Rhythm: Normal rate and regular rhythm.      Heart sounds: No murmur heard. Pulmonary:     Effort: Pulmonary effort is normal. No respiratory distress.     Breath sounds: Rhonchi (left anterior and posterior) present. No wheezing or rales.  Neurological:     Mental Status: He is alert.  Psychiatric:        Mood and Affect: Mood normal.        Behavior: Behavior normal.     Data Reviewed: BP noted Creatinine stable 1.75 Transaminases mildly up WBC 18.1 Plts 138 CT noted  Family Communication: none present  Disposition: Status is: Inpatient Remains inpatient appropriate because: pneumonia  Planned Discharge Destination: Home    Time spent: 25 minutes  Author: Brendia Sacks, MD 06/05/2023 10:53 AM  For on call review [email or web address].

## 2023-06-06 DIAGNOSIS — I480 Paroxysmal atrial fibrillation: Secondary | ICD-10-CM | POA: Diagnosis not present

## 2023-06-06 DIAGNOSIS — E119 Type 2 diabetes mellitus without complications: Secondary | ICD-10-CM | POA: Diagnosis not present

## 2023-06-06 DIAGNOSIS — J181 Lobar pneumonia, unspecified organism: Secondary | ICD-10-CM | POA: Diagnosis not present

## 2023-06-06 DIAGNOSIS — N1832 Chronic kidney disease, stage 3b: Secondary | ICD-10-CM | POA: Diagnosis not present

## 2023-06-06 LAB — GLUCOSE, CAPILLARY
Glucose-Capillary: 193 mg/dL — ABNORMAL HIGH (ref 70–99)
Glucose-Capillary: 144 mg/dL — ABNORMAL HIGH (ref 70–99)
Glucose-Capillary: 149 mg/dL — ABNORMAL HIGH (ref 70–99)

## 2023-06-06 LAB — COMPREHENSIVE METABOLIC PANEL
ALT: 43 U/L (ref 0–44)
AST: 37 U/L (ref 15–41)
Albumin: 3.4 g/dL — ABNORMAL LOW (ref 3.5–5.0)
Alkaline Phosphatase: 95 U/L (ref 38–126)
Anion gap: 11 (ref 5–15)
BUN: 39 mg/dL — ABNORMAL HIGH (ref 8–23)
CO2: 23 mmol/L (ref 22–32)
Calcium: 8.8 mg/dL — ABNORMAL LOW (ref 8.9–10.3)
Chloride: 105 mmol/L (ref 98–111)
Creatinine, Ser: 2.02 mg/dL — ABNORMAL HIGH (ref 0.61–1.24)
GFR, Estimated: 34 mL/min — ABNORMAL LOW (ref >60)
Glucose, Bld: 142 mg/dL — ABNORMAL HIGH (ref 70–99)
Potassium: 3.8 mmol/L (ref 3.5–5.1)
Sodium: 139 mmol/L (ref 135–145)
Total Bilirubin: 0.7 mg/dL (ref <1.2)
Total Protein: 6.2 g/dL — ABNORMAL LOW (ref 6.5–8.1)

## 2023-06-06 LAB — CBC
HCT: 34.8 % — ABNORMAL LOW (ref 39.0–52.0)
Hemoglobin: 11.2 g/dL — ABNORMAL LOW (ref 13.0–17.0)
MCH: 29.5 pg (ref 26.0–34.0)
MCHC: 32.2 g/dL (ref 30.0–36.0)
MCV: 91.6 fL (ref 80.0–100.0)
Platelets: 136 10*3/uL — ABNORMAL LOW (ref 150–400)
RBC: 3.8 MIL/uL — ABNORMAL LOW (ref 4.22–5.81)
RDW: 14.9 % (ref 11.5–15.5)
WBC: 9.3 10*3/uL (ref 4.0–10.5)
nRBC: 0 % (ref 0.0–0.2)

## 2023-06-06 LAB — PROTIME-INR
INR: 2.1 — ABNORMAL HIGH (ref 0.8–1.2)
Prothrombin Time: 24.2 s — ABNORMAL HIGH (ref 11.4–15.2)

## 2023-06-06 LAB — LEGIONELLA PNEUMOPHILA SEROGP 1 UR AG: L. pneumophila Serogp 1 Ur Ag: NEGATIVE

## 2023-06-06 MED ORDER — LANTUS SOLOSTAR 100 UNIT/ML ~~LOC~~ SOPN: 55.0000 [IU] | PEN_INJECTOR | Freq: Every day | SUBCUTANEOUS | Status: DC

## 2023-06-06 MED ORDER — CEFADROXIL 500 MG PO CAPS: 1000.0000 mg | ORAL_CAPSULE | Freq: Two times a day (BID) | ORAL | 0 refills | Status: DC

## 2023-06-06 MED ORDER — SENNA 8.6 MG PO TABS: 1.0000 | ORAL_TABLET | Freq: Every day | ORAL | Status: DC

## 2023-06-06 MED ORDER — AZITHROMYCIN 250 MG PO TABS: ORAL_TABLET | ORAL | 0 refills | Status: DC

## 2023-06-06 MED ORDER — POLYETHYLENE GLYCOL 3350 17 G PO PACK
17.0000 g | PACK | Freq: Two times a day (BID) | ORAL | Status: DC
  Administered 2023-06-06: 17 g via ORAL
  Filled 2023-06-06: qty 1

## 2023-06-06 MED ORDER — WARFARIN SODIUM 5 MG PO TABS: 5.0000 mg | ORAL_TABLET | Freq: Once | ORAL | Status: DC

## 2023-06-06 NOTE — Discharge Summary (Signed)
Physician Discharge Summary   Patient: William [name] MRN: [id number] DOB: April 15, 1951  Admit date:     06/04/2023  Discharge date: 06/06/23  Discharge Physician: Brendia Sacks   PCP: [name], MD   Recommendations at discharge:   Resolution of pneumonia  Mild thrombocytopenia   Mild splenomegaly seen on CT.    Discharge Diagnoses: Principal Problem:   Lobar pneumonia ([name]) Active Problems:   Essential hypertension   Paroxysmal atrial fibrillation ([name])   Coronary artery disease due to lipid rich plaque   Chronic heart failure with mildly reduced ejection fraction (HFmrEF, 41-49%) ([name])   CKD stage 3b, GFR 30-44 ml/min ([name])   Type 2 diabetes mellitus with stage 3b chronic kidney disease, without long-term current use of insulin ([name])   Insulin-requiring or dependent type II diabetes mellitus ([name])   Elevated transaminase level  Resolved Problems:   * No resolved hospital problems. *  Hospital Course: 72 year old man PMH including diabetes, CKD, atrial fibrillation on warfarin, presented with shaking chills and general malaise.  Imaging revealed pneumonia.  Admitted for IV antibiotics.  Consultants None  Procedures None  Lobar pneumonia Afebrile, no hypoxia. Strep pneumo antigen negative.  Follow-up Legionella antigen. Complete Zithromax and oral cephalosporin as an outpatient  WBC 18.1 > 9.3  Mild thrombocytopenia Plts stable 136 Follow-up as an outpatient   Chronic HFmrEF  EF was 45-50% on most recent echo from 2018  Appears compensated  Continue torsemide, monitor volume status     Atrial fibrillation  In SR in ED  Continue warfarin with pharmacy assistance   INR 2.1   Insulin-dependent DM  A1c was 8.0% in October 2024  CBG stable. Resume home insulin on discharge.   CKD 3B  Appears close to baseline 1.7-1.9   CAD  Asymptomatic, continue statin and beta-blocker   Elevated transaminases  Mild elevation in transaminases noted on  admission, spontaneously resolved Abdominal exam benign and no acute findings on CT in ED  May be fatty liver not seen on imaging.  Follow-up as an outpatient.   Mild splenomegaly seen on CT.  Follow-up as an outpatient.   Aortic Atherosclerosis (ICD10-I70.0) Follow-up as an outpatient  CBC WBC 18.1 > 9.3 Plts stable 136  Disposition: Home Diet recommendation:  Regular diet DISCHARGE MEDICATION: Allergies as of 06/06/2023       Reactions   Morphine And Codeine Shortness Of Breath, Other (See Comments)   UNSPECIFIED REACTION "Pt said it was too much"   Latex Rash        Medication List     TAKE these medications    acetaminophen 500 MG tablet Commonly known as: TYLENOL Take 500 mg by mouth every 6 (six) hours as needed for moderate pain.   azithromycin 250 MG tablet Commonly known as: ZITHROMAX Take in evening   carvedilol 3.125 MG tablet Commonly known as: COREG TAKE 1 TABLET(3.125 MG) BY MOUTH TWICE DAILY WITH A MEAL   cefadroxil 500 MG capsule Commonly known as: DURICEF Take 2 capsules (1,000 mg total) by mouth 2 (two) times daily.   Dexcom G7 Receiver Devi 1 Act by Does not apply route daily.   Farxiga 10 MG Tabs tablet Generic drug: dapagliflozin propanediol TAKE 1 TABLET(10 MG) BY MOUTH DAILY What changed: See the new instructions.   FreeStyle Libre 2 Sensor Misc APPLY A NEW SENSOR EVERY 14 DAYS   Dexcom G7 Sensor Misc 1 Act by Does not apply route daily.   gabapentin 300 MG capsule Commonly known as: NEURONTIN  TAKE 1 CAPSULE(300 MG) BY MOUTH TWICE DAILY What changed: See the new instructions.   HumaLOG KwikPen 100 UNIT/ML KwikPen Generic drug: insulin lispro INJECT 10 UNITS UNDER THE SKIN THREE TIMES DAILY AS DIRECTED BY SLIDING SCALE What changed: See the new instructions.   Insulin Pen Needle 31G X 8 MM Misc 1 each by Does not apply route 4 (four) times daily.   Lantus SoloStar 100 UNIT/ML Solostar Pen Generic drug: insulin  glargine Inject 55 Units into the skin at bedtime.   Ozempic (0.25 or 0.5 MG/DOSE) 2 MG/3ML Sopn Generic drug: Semaglutide(0.25 or 0.5MG /DOS) Inject 0.25 mg into the skin once a week.   pantoprazole 40 MG tablet Commonly known as: PROTONIX TAKE 1 TABLET(40 MG) BY MOUTH TWICE DAILY BEFORE A MEAL What changed: See the new instructions.   pramipexole 0.125 MG tablet Commonly known as: MIRAPEX TAKE 1 TABLET(0.125 MG) BY MOUTH AT BEDTIME What changed: See the new instructions.   rosuvastatin 40 MG tablet Commonly known as: CRESTOR TAKE 1 TABLET BY MOUTH EVERY DAY   silver sulfADIAZINE 1 % cream Commonly known as: Silvadene Apply 1 Application topically daily.   torsemide 20 MG tablet Commonly known as: DEMADEX Take 1 tablet (20 mg total) by mouth every other day. TAKE 1 TABLET(20 MG) BY MOUTH 3 TIMES A WEEK Monday, Wednesday and Friday   Vascepa 1 g capsule Generic drug: icosapent Ethyl TAKE 2 CAPSULES(2 GRAMS) BY MOUTH TWICE DAILY What changed: See the new instructions.   warfarin 5 MG tablet Commonly known as: COUMADIN Take as directed. If you are unsure how to take this medication, talk to your nurse or doctor. Original instructions: Take 1 to 1 &1/2  by mouth daily as directed by the coumadin clinic What changed:  how much to take how to take this when to take this additional instructions        Follow-up Information     [name], MD. Schedule an appointment as soon as possible for a visit in 1 week(s).   Specialty: Internal Medicine Contact information: [address] [place] [address] [phone]                Feels better  Discharge Exam: Ceasar Mons Weights   06/05/23 2000 06/06/23 0400  Weight: 88.8 kg 88.8 kg   Physical Exam Vitals reviewed.  Constitutional:      General: He is not in acute distress.    Appearance: He is not ill-appearing or toxic-appearing.  Cardiovascular:     Rate and Rhythm: Normal rate and regular  rhythm.     Heart sounds: No murmur heard. Pulmonary:     Effort: Pulmonary effort is normal. No respiratory distress.     Breath sounds: No wheezing, rhonchi or rales.  Neurological:     Mental Status: He is alert.  Psychiatric:        Mood and Affect: Mood normal.        Behavior: Behavior normal.      Condition at discharge: good  The results of significant diagnostics from this hospitalization (including imaging, microbiology, ancillary and laboratory) are listed below for reference.   Imaging Studies: CT ABDOMEN PELVIS W CONTRAST  Result Date: 06/04/2023 CLINICAL DATA:  Left lower quadrant pain EXAM: CT ABDOMEN AND PELVIS WITH CONTRAST TECHNIQUE: Multidetector CT imaging of the abdomen and pelvis was performed using the standard protocol following bolus administration of intravenous contrast. RADIATION DOSE REDUCTION: This exam was performed according to the departmental dose-optimization program which includes  automated exposure control, adjustment of the mA and/or kV according to patient size and/or use of iterative reconstruction technique. CONTRAST:  80mL OMNIPAQUE IOHEXOL 300 MG/ML  SOLN COMPARISON:  CT 05/13/2023 FINDINGS: Lower chest: Lung bases demonstrate no pleural effusion. Central lobular ground-glass disease in the lingula in left lower lobe and to a lesser extent in the right lung base, consistent with pneumonia. Mild cardiomegaly. Coronary vascular calcification Hepatobiliary: Gallstones. No focal hepatic abnormality or biliary dilatation Pancreas: Unremarkable. No pancreatic ductal dilatation or surrounding inflammatory changes. Spleen: Slightly enlarged measuring 15 cm AP Adrenals/Urinary Tract: Adrenal glands are normal. Kidneys show no hydronephrosis. Small left renal cysts for which no imaging follow-up is recommended. The bladder is unremarkable Stomach/Bowel: The stomach is nonenlarged. There is no dilated small bowel. No acute bowel wall thickening. Negative appendix.  Vascular/Lymphatic: Moderate aortic atherosclerosis. No aneurysm. No suspicious lymph nodes Reproductive: Enlarged prostate Other: Negative for pelvic effusion or free air. Musculoskeletal: No acute or suspicious osseous abnormality. Multilevel degenerative changes IMPRESSION: 1. No CT evidence for acute intra-abdominal or pelvic abnormality. 2. Findings consistent with pneumonia involving the lingula and left greater than right lower lobes. 3. Gallstones. 4. Mild splenomegaly. 5. Enlarged prostate. Aortic Atherosclerosis (ICD10-I70.0). Electronically Signed   By: Jasmine Pang M.D.   On: 06/04/2023 22:41   DG Chest Port 1 View  Result Date: 06/04/2023 CLINICAL DATA:  Chills tremors EXAM: PORTABLE CHEST 1 VIEW COMPARISON:  04/16/2016 FINDINGS: Post sternotomy changes. Cardiomegaly with slight central congestion. Patchy airspace opacities in the left mid lung and bilateral bases. Aortic atherosclerosis. No pneumothorax IMPRESSION: Cardiomegaly with slight central congestion. Patchy airspace opacities in the left mid lung and bilateral bases, suspicious for pneumonia Electronically Signed   By: Jasmine Pang M.D.   On: 06/04/2023 21:47   DG Foot 2 Views Right  Result Date: 05/22/2023 Please see detailed radiograph report in office note.  DG Foot 2 Views Left  Result Date: 05/22/2023 Please see detailed radiograph report in office note.  CT RENAL STONE STUDY  Result Date: 05/19/2023 CLINICAL DATA:  Hematuria, generalized abdominal pain. EXAM: CT ABDOMEN AND PELVIS WITHOUT CONTRAST TECHNIQUE: Multidetector CT imaging of the abdomen and pelvis was performed following the standard protocol without IV contrast. RADIATION DOSE REDUCTION: This exam was performed according to the departmental dose-optimization program which includes automated exposure control, adjustment of the mA and/or kV according to patient size and/or use of iterative reconstruction technique. COMPARISON:  Renal ultrasound February 24, 2018. FINDINGS: Lower chest: Clustered micro nodularity in the left lung base with the additional scattered tiny pulmonary nodules in the left-greater-than-right lung bases measuring up to 3 mm, per Fleischner Society guidelines requiring no independent imaging follow-up. Hepatobiliary: No suspicious hepatic lesion on this noncontrast enhanced examination. Hepatic morphologic changes suggestive of cirrhosis. Cholelithiasis without findings of acute cholecystitis. No biliary ductal dilation. Pancreas: No pancreatic ductal dilation or evidence of acute inflammation. Fatty pancreatic atrophy. Spleen: Mild splenomegaly. Adrenals/Urinary Tract: Bilateral adrenal glands appear normal. No hydronephrosis. Calcifications in the bilateral renal hila are favored vascular. No renal, ureteral or bladder calculi identified. Bilateral perinephric stranding. 17 mm left upper pole hypodense renal lesion previously characterized as a cyst on ultrasound February 24, 2018 is considered benign requiring no independent imaging follow-up. Exophytic hypodense 8 mm left lower pole renal lesion on image 90/6 measures Hounsfield units of fluid compatible with a cyst and is considered benign requiring no independent imaging follow-up. Urinary bladder is unremarkable for degree of distension. Stomach/Bowel: No radiopaque enteric contrast  material was administered. Stomach is unremarkable for degree of distension. No pathologic dilation of small or large bowel. Vascular/Lymphatic: Aortic atherosclerosis. Smooth IVC contours. No pathologically enlarged abdominal or pelvic lymph nodes. Reproductive: Enlarged prostate gland. Other: Nodular stranding in the anterior abdominal wall for instance on the right on image 62/2 may reflects sequela of subcutaneous injections. Musculoskeletal: Multilevel degenerative changes spine with Modic type endplate changes and Schmorl's node formation at L4-L5. IMPRESSION: 1. No hydronephrosis. No renal, ureteral or  bladder calculi identified. 2. Bilateral perinephric stranding, nonspecific but can be seen in the setting of infection or medical renal disease. Correlate with renal function tests. 3. Enlarged prostate gland. 4. Hepatic morphologic changes suggestive of cirrhosis. 5. Cholelithiasis without findings of acute cholecystitis. 6. Mild splenomegaly. 7.  Aortic Atherosclerosis (ICD10-I70.0). Electronically Signed   By: Maudry Mayhew M.D.   On: 05/19/2023 09:53    Microbiology: Results for orders placed or performed during the hospital encounter of 06/04/23  Blood Culture (routine x 2)     Status: None (Preliminary result)   Collection Time: 06/04/23  8:47 PM   Specimen: BLOOD  Result Value Ref Range Status   Specimen Description   Final    BLOOD RIGHT ANTECUBITAL Performed at [facility], [address], [place], [place] [address]    Special Requests   Final    BOTTLES DRAWN AEROBIC AND ANAEROBIC Blood Culture results may not be optimal due to an inadequate volume of blood received in culture bottles Performed at [facility], [address], [place], [place] [address]    Culture   Final    NO GROWTH 2 DAYS Performed at [facility], [address] [place], [place] [address]    Report Status PENDING  Incomplete  Blood Culture (routine x 2)     Status: None (Preliminary result)   Collection Time: 06/04/23  9:22 PM   Specimen: BLOOD  Result Value Ref Range Status   Specimen Description   Final    BLOOD BLOOD LEFT ARM Performed at [facility], [address], [place], [place] [address]    Special Requests   Final    BOTTLES DRAWN AEROBIC AND ANAEROBIC Blood Culture results may not be optimal due to an inadequate volume of blood received in culture bottles Performed at [facility], [address] [place], [place] [address]    Culture   Final    NO GROWTH 2 DAYS Performed at [facility], [address] [place], [place] [address]    Report Status PENDING  Incomplete    Labs: CBC: Recent Labs  Lab 06/04/23 2046 06/05/23 0509 06/06/23 0616  WBC 9.6 18.1* 9.3  NEUTROABS 8.1*  --   --   HGB 15.8 11.9* 11.2*  HCT 49.0 35.5* 34.8*  MCV 90.1 88.5 91.6  PLT 184 138* 136*   Basic Metabolic Panel: Recent Labs  Lab 06/04/23 2046 06/05/23 0509 06/06/23 0616  NA 139 138 139  K 4.3 4.0 3.8  CL 102 107 105  CO2 25 22 23   GLUCOSE 84 86 142*  BUN 38* 40* 39*  CREATININE 1.76* 1.75* 2.02*  CALCIUM 9.8 8.7* 8.8*   Liver Function Tests: Recent Labs  Lab 06/04/23 2046 06/05/23 0509 06/06/23 0616  AST 44* 42* 37  ALT 49* 47* 43  ALKPHOS 121 88 95  BILITOT 1.1 0.8 0.7  PROT 8.7* 6.2* 6.2*  ALBUMIN 5.1* 3.5 3.4*   CBG: Recent  Labs  Lab 06/05/23 1630 06/05/23 2058 06/06/23 0106 06/06/23 0731 06/06/23 1111  GLUCAP 111* 113* 193* 144* 149*    Discharge time spent: less than 30 minutes.  Signed: Brendia Sacks, MD [facility] 06/06/2023

## 2023-06-06 NOTE — Progress Notes (Signed)
[facility] - Clinic Note  06/17/2023    CHIEF COMPLAINT Patient presents for Retina Follow Up  HISTORY OF PRESENT ILLNESS: William [name] is a 72 y.o. male who presents to the clinic today for:   HPI     Retina Follow Up   Patient presents with  Diabetic Retinopathy.  In both eyes.  This started 6 weeks ago.  Duration of 6 weeks.  Since onset it is stable.  I, the attending physician,  performed the HPI with the patient and updated documentation appropriately.        Comments   6 week retina follow up NPDR and IVE OU  pt is reporting noticed he denies any flashes or floaters pt last reading was 130 pt was in hospital for pneumonia 2 weeks ago was kept for a few days       Last edited by William Chris, MD on 06/18/2023 12:34 AM.     Pt states vision is the same, pts daughter states pt is now on Ozempic  Referring physician: [name], MD [address] [place],  [place] [address]  HISTORICAL INFORMATION:  Selected notes from the MEDICAL RECORD NUMBER Referred by Dr. Alben Spittle for eval of DME OU LEE:  Ocular Hx- PMH-    CURRENT MEDICATIONS: No current outpatient medications on file. (Ophthalmic Drugs)   No current facility-administered medications for this visit. (Ophthalmic Drugs)   Current Outpatient Medications (Other)  Medication Sig   acetaminophen (TYLENOL) 500 MG tablet Take 500 mg by mouth every 6 (six) hours as needed for moderate pain.   azithromycin (ZITHROMAX) 250 MG tablet Take in evening   carvedilol (COREG) 3.125 MG tablet TAKE 1 TABLET(3.125 MG) BY MOUTH TWICE DAILY WITH A MEAL   cefadroxil (DURICEF) 500 MG capsule Take 2 capsules (1,000 mg total) by mouth 2 (two) times daily.   Continuous Glucose Receiver (DEXCOM G7 RECEIVER) DEVI 1 Act by Does not apply route daily.   Continuous Glucose Sensor (DEXCOM G7 SENSOR) MISC 1 Act by Does not apply route daily.   Continuous Glucose Sensor (FREESTYLE LIBRE 2 SENSOR) MISC APPLY A NEW  SENSOR EVERY 14 DAYS (Patient not taking: Reported on 05/23/2023)   dapagliflozin propanediol (FARXIGA) 10 MG TABS tablet TAKE 1 TABLET(10 MG) BY MOUTH DAILY (Patient taking differently: Take 10 mg by mouth daily.)   gabapentin (NEURONTIN) 300 MG capsule TAKE 1 CAPSULE(300 MG) BY MOUTH TWICE DAILY (Patient taking differently: Take 300 mg by mouth 2 (two) times daily.)   HUMALOG KWIKPEN 100 UNIT/ML KwikPen INJECT 10 UNITS UNDER THE SKIN THREE TIMES DAILY AS DIRECTED BY SLIDING SCALE (Patient taking differently: Inject 10 Units into the skin in the morning, at noon, and at bedtime.)   insulin glargine (LANTUS SOLOSTAR) 100 UNIT/ML Solostar Pen Inject 55 Units into the skin at bedtime.   Insulin Pen Needle 31G X 8 MM MISC 1 each by Does not apply route 4 (four) times daily.   OZEMPIC, 0.25 OR 0.5 MG/DOSE, 2 MG/3ML SOPN Inject 0.25 mg into the skin once a week.   pantoprazole (PROTONIX) 40 MG tablet TAKE 1 TABLET(40 MG) BY MOUTH TWICE DAILY BEFORE A MEAL (Patient taking differently: Take 40 mg by mouth 2 (two) times daily.)   pramipexole (MIRAPEX) 0.125 MG tablet TAKE 1 TABLET(0.125 MG) BY MOUTH AT BEDTIME (Patient taking differently: Take 0.125 mg by mouth at bedtime.)   rosuvastatin (CRESTOR) 40 MG tablet TAKE 1 TABLET BY MOUTH EVERY DAY (Patient taking differently: Take  40 mg by mouth daily.)   silver sulfADIAZINE (SILVADENE) 1 % cream Apply 1 Application topically daily.   torsemide (DEMADEX) 20 MG tablet Take 1 tablet (20 mg total) by mouth every other day. TAKE 1 TABLET(20 MG) BY MOUTH 3 TIMES A WEEK Monday, Wednesday and Friday   VASCEPA 1 g capsule TAKE 2 CAPSULES(2 GRAMS) BY MOUTH TWICE DAILY (Patient taking differently: Take 2 g by mouth 2 (two) times daily.)   warfarin (COUMADIN) 5 MG tablet Take 1 to 1 &1/2  by mouth daily as directed by the coumadin clinic (Patient taking differently: Take 5-7.5 mg by mouth See admin instructions. Take 1 to 1 &1/2  by mouth daily as directed by the coumadin  clinic Take 7.5 mg by mouth daily (1.5 tablet of 5 mg) on Tues, Thurs, Sat and Sun. Takes 5 mg (one 5 mg tablet) on Mon, Wed and Fri.)   No current facility-administered medications for this visit. (Other)   REVIEW OF SYSTEMS: ROS   Positive for: Gastrointestinal, Genitourinary, Endocrine, Eyes Negative for: Constitutional, Neurological, Skin, Musculoskeletal, HENT, Cardiovascular, Respiratory, Psychiatric, Allergic/Imm, Heme/Lymph Last edited by William [name], COT on 06/17/2023  7:51 AM.        ALLERGIES Allergies  Allergen Reactions   Morphine And Codeine Shortness Of Breath and Other (See Comments)    UNSPECIFIED REACTION "Pt said it was too much"    Latex Rash   PAST MEDICAL HISTORY Past Medical History:  Diagnosis Date   AKI (acute kidney injury) ([name])    With STEMI in 2017   Chronic systolic CHF (congestive heart failure) ([name])    Depression    Diabetes mellitus without complication ([name])    Diabetic retinopathy ([name])    GERD (gastroesophageal reflux disease)    Hx of adenomatous colonic polyps 04/07/2018   Hyperlipidemia    Hypertension    Hypertensive retinopathy    Paroxysmal atrial fibrillation ([name])    Peripheral vascular disease ([name])    Pneumonia    Seizures ([name])    hx of as a child   STEMI (ST elevation myocardial infarction) ([name]) 2017   Past Surgical History:  Procedure Laterality Date   ABDOMINAL AORTOGRAM W/LOWER EXTREMITY N/A 09/19/2016   Procedure: Abdominal Aortogram w/Lower Extremity;  Surgeon: Iran Ouch, MD;  Location: [facility] INVASIVE CV LAB;  Service: Cardiovascular;  Laterality: N/A;   AMPUTATION Right 03/23/2016   Procedure: 1st and 2nd Ray Amputation Right Foot;  Surgeon: Nadara Mustard, MD;  Location: [facility] OR;  Service: Orthopedics;  Laterality: Right;   AMPUTATION Right 06/21/2016   Procedure: RIGHT TRANSMETATARSAL AMPUTATION;  Surgeon: Nadara Mustard, MD;  Location: [facility] OR;  Service: Orthopedics;  Laterality: Right;   CARDIAC  CATHETERIZATION N/A 03/13/2016   Procedure: Right/Left Heart Cath and Coronary Angiography;  Surgeon: Tonny Bollman, MD;  Location: [facility] INVASIVE CV LAB;  Service: Cardiovascular;  Laterality: N/A;   CARDIAC CATHETERIZATION N/A 03/13/2016   Procedure: IABP Insertion;  Surgeon: Tonny Bollman, MD;  Location: [facility] INVASIVE CV LAB;  Service: Cardiovascular;  Laterality: N/A;   CATARACT EXTRACTION     CATARACT EXTRACTION, BILATERAL     CERVICAL FUSION  1982, 1992   has had 3 neck surgeries from breaking his neck   CIRCUMCISION N/A 11/07/2021   Procedure: CIRCUMCISION ADULT;  Surgeon: Despina Arias, MD;  Location: [facility] ORS;  Service: Urology;  Laterality: N/A;   CORONARY ARTERY BYPASS GRAFT N/A 03/13/2016   Procedure: CORONARY ARTERY BYPASS GRAFTING (CABG) x 1 (SVG to  OM) with EVH from LEFT GREATER SAPHENOUS VEIN;  Surgeon: Kerin Perna, MD;  Location: [facility] OR;  Service: Open Heart Surgery;  Laterality: N/A;   EYE SURGERY     LOWER EXTREMITY ANGIOGRAM  05/02/2016   Procedure: Lower Extremity Angiogram;  Surgeon: Iran Ouch, MD;  Location: [facility] INVASIVE CV LAB;  Service: Cardiovascular;;  Limited left femoral runoff right femoral runoff   PERIPHERAL VASCULAR CATHETERIZATION N/A 05/02/2016   Procedure: Abdominal Aortogram;  Surgeon: Iran Ouch, MD;  Location: [facility] INVASIVE CV LAB;  Service: Cardiovascular;  Laterality: N/A;   PERIPHERAL VASCULAR CATHETERIZATION Right 05/02/2016   Procedure: Peripheral Vascular Balloon Angioplasty;  Surgeon: Iran Ouch, MD;  Location: [facility] INVASIVE CV LAB;  Service: Cardiovascular;  Laterality: Right;  SFA   TEE WITHOUT CARDIOVERSION N/A 03/13/2016   Procedure: TRANSESOPHAGEAL ECHOCARDIOGRAM (TEE);  Surgeon: Kerin Perna, MD;  Location: [facility] OR;  Service: Open Heart Surgery;  Laterality: N/A;   VSD REPAIR N/A 03/13/2016   Procedure: VENTRICULAR SEPTAL DEFECT (VSD) REPAIR;  Surgeon: Kerin Perna, MD;  Location: [facility] OR;  Service: Open Heart Surgery;   Laterality: N/A;   FAMILY HISTORY Family History  Problem Relation Age of Onset   Diabetes Maternal Grandmother    Diabetes Mother    Aneurysm Mother    Peripheral Artery Disease Mother    Coronary artery disease Mother    Peptic Ulcer Father    Retinoblastoma Daughter    Colon cancer Neg Hx    Rectal cancer Neg Hx    SOCIAL HISTORY Social History   Tobacco Use   Smoking status: Former    Current packs/day: 0.00    Types: Cigarettes    Quit date: 11/20/2015    Years since quitting: 7.5   Smokeless tobacco: Never   Tobacco comments:    quit 2018  Vaping Use   Vaping status: Never Used  Substance Use Topics   Alcohol use: No   Drug use: No       OPHTHALMIC EXAM: Base Eye Exam     Visual Acuity (Snellen - Linear)       Right Left   Dist [place] 20/25 -1 20/25 -1   Dist ph Tina NI NI         Tonometry (Tonopen, 7:55 AM)       Right Left   Pressure 12 12         Pupils       Pupils Dark Light Shape React APD   Right PERRL 2 1 Round Brisk None   Left PERRL 2 1 Round Brisk None         Visual Fields       Left Right    Full Full         Extraocular Movement       Right Left    Full, Ortho Full, Ortho         Neuro/Psych     Oriented x3: Yes   Mood/Affect: Normal         Dilation     Both eyes:            Slit Lamp and Fundus Exam     Slit Lamp Exam       Right Left   Lids/Lashes Dermatochalasis - upper lid, mild MGD Dermatochalasis - upper lid, mild MGD   Conjunctiva/Sclera White and quiet White and quiet   Cornea 1+fine PEE, well healed cataract wound, mild tear film debris Trace PEE, mild  tear film debris, well healed cataract wound   Anterior Chamber Deep and quiet Deep and quiet   Iris Round and dilated, No NVI Round and dilated, No NVI   Lens PC IOL in good position PC IOL in good position   Anterior Vitreous Vitreous syneresis, vitreous condensations Vitreous syneresis, Posterior vitreous detachment, vitreous  condensations         Fundus Exam       Right Left   Disc trace Pallor, Sharp rim mild Pallor, Sharp rim, mild tilt   C/D Ratio 0.3 0.3   Macula Flat, good foveal reflex, scatted Microaneurysms, persistent cystic changes -- slightly increased superiorly, possible targets for focal laser superiorly Flat, good foveal reflex, scattered MA -- improved, interval improvement in cystic changes temporal mac, good focal laser changes   Vessels attenuated, mild tortuosity attenuated, Tortuous   Periphery Attached, +MA greatest posteriorly Attached, scattered MA greatest posteriorly           IMAGING AND PROCEDURES  Imaging and Procedures for 06/17/2023  OCT, Retina - OU - Both Eyes       Right Eye Quality was good. Central Foveal Thickness: 244. Progression has worsened. Findings include no SRF, abnormal foveal contour, intraretinal hyper-reflective material, intraretinal fluid, vitreomacular adhesion (Mild interval increase in IRF/cystic changes superior fovea and mac ).   Left Eye Quality was good. Central Foveal Thickness: 245. Progression has improved. Findings include normal foveal contour, no SRF, intraretinal hyper-reflective material, intraretinal fluid (interval improvement in IRF/ cystic changes temporal macula).   Notes *Images captured and stored on drive  Diagnosis / Impression:  +DME OU OD: Mild interval increase in IRF/cystic changes superior fovea and mac  OS: interval improvement in IRF/ cystic changes temporal macula  Clinical management:  See below  Abbreviations: NFP - Normal foveal profile. CME - cystoid macular edema. PED - pigment epithelial detachment. IRF - intraretinal fluid. SRF - subretinal fluid. EZ - ellipsoid zone. ERM - epiretinal membrane. ORA - outer retinal atrophy. ORT - outer retinal tubulation. SRHM - subretinal hyper-reflective material. IRHM - intraretinal hyper-reflective material      Intravitreal Injection, Pharmacologic Agent - OS -  Left Eye       Time Out 06/17/2023. 8:34 AM. Confirmed correct patient, procedure, site, and patient consented.   Anesthesia Topical anesthesia was used. Anesthetic medications included Lidocaine 2%, Proparacaine 0.5%.   Procedure Preparation included 5% betadine to ocular surface, eyelid speculum. A (32g) needle was used.   Injection: 2 mg aflibercept 2 MG/0.05ML   Route: Intravitreal, Site: Left Eye   NDC: L6038910, Lot: [id number], Expiration date: 09/29/2024, Waste: 0 mL   Post-op Post injection exam found visual acuity of at least counting fingers. The patient tolerated the procedure well. There were no complications. The patient received written and verbal post procedure care education. Post injection medications were not given.      Intravitreal Injection, Pharmacologic Agent - OD - Right Eye       Time Out 06/17/2023. 8:34 AM. Confirmed correct patient, procedure, site, and patient consented.   Anesthesia Topical anesthesia was used. Anesthetic medications included Lidocaine 2%, Proparacaine 0.5%.   Procedure Preparation included 5% betadine to ocular surface, eyelid speculum. A (32g) needle was used.   Injection: 2 mg aflibercept 2 MG/0.05ML   Route: Intravitreal, Site: Right Eye   NDC: L6038910, Lot: [id number], Expiration date: 10/30/2023, Waste: 0 mL   Post-op Post injection exam found visual acuity of at least counting fingers. The patient tolerated the procedure well.  There were no complications. The patient received written and verbal post procedure care education. Post injection medications were not given.             ASSESSMENT/PLAN:    ICD-10-CM   1. Moderate nonproliferative diabetic retinopathy of both eyes with macular edema associated with type 2 diabetes mellitus ([name])  E11.3313 OCT, Retina - OU - Both Eyes    Intravitreal Injection, Pharmacologic Agent - OS - Left Eye    aflibercept (EYLEA) SOLN 2 mg    Intravitreal Injection,  Pharmacologic Agent - OD - Right Eye    aflibercept (EYLEA) SOLN 2 mg    2. Current use of insulin ([name])  Z79.4     3. Long-term (current) use of injectable non-insulin antidiabetic drugs  Z79.85     4. Essential hypertension  I10     5. Hypertensive retinopathy of both eyes  H35.033     6. Pseudophakia, both eyes  Z96.1      1-3. Moderate non-proliferative diabetic retinopathy, both eyes  - last A1c was 8.0 on 11.06.23  - s/p IVA OD #1 (11.14.22), #2 (12.12.22), #3 (01.09.23) -- IVA resistance - s/p IVA OS #1 (10.17.22), #2 (11.14.22), #3 (12.12.22), #4 (01.09.23), #5 (03.15.23), #6 (04.12.23) -- IVA resistance - s/p IVE OD #1 (03.15.23), #2 (04.12.23), #3 (05.10.23), #4 (06.07.23), #5 (07.05.23), #6 (08.02.23), #7 (08.31.23), #8 (09.28.23), #9 (10.30.23), #10 (11.28.23), #11 (12.28.23), #12 (01.29.24), #13 (02.20.24), #14 (03.25.24), #15 (04.22.24), #16 (05.20.24), #17 (06.17.24), #18 (07.22.24), #19 (08.26.24), #20 (09.30.24), #21 (11.04.24) - s/p IVE OS #1 (05.10.23), #2 (06.07.23), #3 (07.05.23), #4 (08.02.23), #5 (08.31.23), #6 (09.28.23), #7 (10.30.23), #8 (11.28.23), #9 (12.28.23), #10 (01.29.24), #11 (02.20..24), #12 (03.25.24), #13 (04.22.24), #14 (05.20.24), #15 (06.17.24), #16 (07.22.24), #17 (09.30.24), #18 (11.04.24) - s/p focal laser OS (06.25.24) - exam shows scattered MA, DBH OU - FA (10.17.22) shows late leaking MA OU, no NV OU - OCT shows OD: Mild interval increase in IRF/cystic changes superior fovea and mac; OS: interval improvement in IRF/ cystic changes temporal macula at 5 wks - BCVA 20/25 OU **discussed decreased efficacy / resistance to Eylea and potential benefit of switching medication* - recommend IVE OD #22 and IVE OS #19 today, 12.16.24 w/ f/u extended to 6 wks - pt wishes to proceed with injection OU - RBA of procedure discussed, questions answered - IVE informed consent obtained and signed, 11.04.24 (OU) - see procedure note - will check [employer] auth  for 2025 - follow up 3-4 weeks, DFE, OCT, focal laser OD - follow up 6 weeks for injections (~January 27th)  4,5. Hypertensive retinopathy OU - discussed importance of tight BP control and possibility of elevated BP blunting efficacy of IVE  - monitor  6. Pseudophakia OU  - s/p CE/IOL OU  - IOL in good position, doing well  - monitor  Ophthalmic Meds Ordered this visit:  Meds ordered this encounter  Medications   aflibercept (EYLEA) SOLN 2 mg   aflibercept (EYLEA) SOLN 2 mg     Return for f/u 3-4 weeks, NPDR OU, DFE, OCT, focal laser OD.  There are no Patient Instructions on file for this visit.  Explained the diagnoses, plan, and follow up with the patient and they expressed understanding.  Patient expressed understanding of the importance of proper follow up care.   This document serves as a record of services personally performed by Karie Chimera, MD, PhD. It was created on their behalf by Glee Arvin. Manson Passey, OA an ophthalmic technician. The  creation of this record is the provider's dictation and/or activities during the visit.    Electronically signed by: Glee Arvin. Manson Passey, OA 06/18/23 12:34 AM   Karie Chimera, M.D., Ph.D. Diseases & Surgery of the Retina and Vitreous [facility]  I have reviewed the above documentation for accuracy and completeness, and I agree with the above. Karie Chimera, M.D., Ph.D. 06/18/23 12:35 AM   Abbreviations: M myopia (nearsighted); A astigmatism; H hyperopia (farsighted); P presbyopia; Mrx spectacle prescription;  CTL contact lenses; OD right eye; OS left eye; OU both eyes  XT exotropia; ET esotropia; PEK punctate epithelial keratitis; PEE punctate epithelial erosions; DES dry eye syndrome; MGD meibomian gland dysfunction; ATs artificial tears; PFAT's preservative free artificial tears; NSC nuclear sclerotic cataract; PSC posterior subcapsular cataract; ERM epi-retinal membrane; PVD posterior vitreous detachment; RD  retinal detachment; DM diabetes mellitus; DR diabetic retinopathy; NPDR non-proliferative diabetic retinopathy; PDR proliferative diabetic retinopathy; CSME clinically significant macular edema; DME diabetic macular edema; dbh dot blot hemorrhages; CWS cotton wool spot; POAG primary open angle glaucoma; C/D cup-to-disc ratio; HVF humphrey visual field; GVF goldmann visual field; OCT optical coherence tomography; IOP intraocular pressure; BRVO Branch retinal vein occlusion; CRVO central retinal vein occlusion; CRAO central retinal artery occlusion; BRAO branch retinal artery occlusion; RT retinal tear; SB scleral buckle; PPV pars plana vitrectomy; VH Vitreous hemorrhage; PRP panretinal laser photocoagulation; IVK intravitreal kenalog; VMT vitreomacular traction; MH Macular hole;  NVD neovascularization of the disc; NVE neovascularization elsewhere; AREDS age related eye disease study; ARMD age related macular degeneration; POAG primary open angle glaucoma; EBMD epithelial/anterior basement membrane dystrophy; ACIOL anterior chamber intraocular lens; IOL intraocular lens; PCIOL posterior chamber intraocular lens; Phaco/IOL phacoemulsification with intraocular lens placement; PRK photorefractive keratectomy; LASIK laser assisted in situ keratomileusis; HTN hypertension; DM diabetes mellitus; COPD chronic obstructive pulmonary disease

## 2023-06-06 NOTE — Progress Notes (Signed)
PHARMACY - ANTICOAGULATION CONSULT NOTE  Pharmacy Consult for Warfarin Indication: atrial fibrillation  Allergies  Allergen Reactions   Morphine And Codeine Shortness Of Breath and Other (See Comments)    UNSPECIFIED REACTION "Pt said it was too much"    Latex Rash    Patient Measurements: Height: 5\' 5"  (165.1 cm) Weight: 88.8 kg (195 lb 12.3 oz) IBW/kg (Calculated) : 61.5   Vital Signs: Temp: 99.1 F (37.3 C) (12/05 0220) Temp Source: Oral (12/05 0220) BP: 125/52 (12/05 0220) Pulse Rate: 96 (12/05 0757)  Labs: Recent Labs    06/04/23 2046 06/05/23 0509 06/06/23 0616  HGB 15.8 11.9* 11.2*  HCT 49.0 35.5* 34.8*  PLT 184 138* 136*  APTT 45*  --   --   LABPROT 20.7* 22.9* 24.2*  INR 1.8* 2.0* 2.1*  CREATININE 1.76* 1.75* 2.02*    Estimated Creatinine Clearance: 33.9 mL/min (A) (by C-G formula based on SCr of 2.02 mg/dL (H)).   Medications:  PTA Warfarin - LD unknown (patient reported daughter helps with meds and med rec to f/u with daughter later this morning) Per anticoag clinic visit on 11/25, INR = 3.3 Patient instructed to hold warfarin x 1 day then continue with warfarin 7.5mg  daily except 5mg  MWF.  Assessment: 5 yoM admitted with pneumonia. On chronic warfarin therapy for AFib; Pharmacy to continue dosing while admitted  Baseline INR slightly subtherapeutic Prior anticoagulation: warfarin 5 mg MWF, 7.5 mg all other days, last dose 12/3 PM  Significant events:  Today, 06/06/2023: CBC: Hgb down from admission (likely hemoconcentrated) but stable today INR therapeutic and stable this AM Major drug interactions: none; macrolides may slightly increase warfarin potency No bleeding issues per nursing Meal intake not charted  Goal of Therapy: INR 2-2.5  Plan: Since no missed warf doses, INR therapeutic, and warfarin 7.5 mg given on what would otherwise be a 5 mg day, will give 5 mg PO x 1 today at 1600 Daily INR CBC at least q72 hr while on  warfarin Monitor for signs of bleeding or thrombosis   Bernadene Person, PharmD, BCPS [phone] 06/06/2023, 11:31 AM

## 2023-06-06 NOTE — Progress Notes (Signed)
   06/06/23 1145  TOC Brief Assessment  Insurance and Status Reviewed  Patient has primary care physician Yes  Home environment has been reviewed Single family home  Prior level of function: Independent  Prior/Current Home Services No current home services  Social Determinants of Health Reivew SDOH reviewed no interventions necessary  Readmission risk has been reviewed Yes  Transition of care needs no transition of care needs at this time

## 2023-06-07 ENCOUNTER — Telehealth: Payer: Self-pay

## 2023-06-07 ENCOUNTER — Ambulatory Visit (HOSPITAL_COMMUNITY)
Admission: RE | Admit: 2023-06-07 | Discharge: 2023-06-07 | Disposition: A | Discharge status: Home / Self Care | Payer: 59 | Source: Ambulatory Visit | Attending: Cardiology | Admitting: Cardiology

## 2023-06-07 DIAGNOSIS — I739 Peripheral vascular disease, unspecified: Secondary | ICD-10-CM | POA: Diagnosis not present

## 2023-06-07 LAB — VAS US ABI WITH/WO TBI
Left ABI: 0.72
Right ABI: 0.86

## 2023-06-07 NOTE — Transitions of Care (Post Inpatient/ED Visit) (Signed)
   06/07/2023  Name: William [name] MRN: [id number] DOB: 03/22/1951  Today's TOC FU Call Status: Today's TOC FU Call Status:: Unsuccessful Call (1st Attempt) Unsuccessful Call (1st Attempt) Date: 06/07/23  Attempted to reach the patient regarding the most recent Inpatient/ED visit.  Follow Up Plan: Additional outreach attempts will be made to reach the patient to complete the Transitions of Care (Post Inpatient/ED visit) call.   Kathryn Cosby Daphine Deutscher BSN, RN RN Care Manager   Transitions of Care [facility] Direct Dial Number:  [phone]

## 2023-06-09 LAB — CULTURE, BLOOD (ROUTINE X 2)
Culture: NO GROWTH
Culture: NO GROWTH

## 2023-06-10 ENCOUNTER — Telehealth: Payer: Self-pay

## 2023-06-10 NOTE — Transitions of Care (Post Inpatient/ED Visit) (Signed)
   06/10/2023  Name: William [name] MRN: [id number] DOB: 12-Sep-1950  Today's TOC FU Call Status: Today's TOC FU Call Status:: Unsuccessful Call (2nd Attempt) Unsuccessful Call (2nd Attempt) Date: 06/10/23  Attempted to reach the patient regarding the most recent Inpatient/ED visit.   Follow Up Plan: Additional outreach attempts will be made to reach the patient to complete the Transitions of Care (Post Inpatient/ED visit) call.     Antionette Fairy, RN,BSN,CCM RN Care Manager Transitions of Care  [place]  Direct Phone: [phone] Toll Free: [phone] Fax: [phone]

## 2023-06-11 ENCOUNTER — Telehealth: Payer: Self-pay

## 2023-06-11 NOTE — Transitions of Care (Post Inpatient/ED Visit) (Signed)
   06/11/2023  Name: William [name] MRN: [id number] DOB: July 05, 1950  Today's TOC FU Call Status: Today's TOC FU Call Status:: Unsuccessful Call (3rd Attempt) Unsuccessful Call (3rd Attempt) Date: 06/11/23  Attempted to reach the patient regarding the most recent Inpatient/ED visit.  Follow Up Plan: No further outreach attempts will be made at this time. We have been unable to contact the patient.    Antionette Fairy, RN,BSN,CCM RN Care Manager Transitions of Care  [place]  Direct Phone: [phone] Toll Free: [phone] Fax: [phone]

## 2023-06-12 ENCOUNTER — Telehealth: Payer: Self-pay | Admitting: Cardiovascular Disease

## 2023-06-12 NOTE — Telephone Encounter (Signed)
He has significant blockages in the left leg.  If he still has the wound on the foot, he will require angiography.  Schedule an appointment within 2 weeks with me or Wynema Birch.

## 2023-06-12 NOTE — Telephone Encounter (Signed)
Daughter Marcelino Duster) called to follow-up on patient's  LE Arterial test results.

## 2023-06-13 NOTE — Telephone Encounter (Signed)
The patient's daughter has been made aware of the results. Appointment made with Dr. Kirke Corin on 12/17.

## 2023-06-17 ENCOUNTER — Ambulatory Visit (INDEPENDENT_AMBULATORY_CARE_PROVIDER_SITE_OTHER): Payer: 59 | Admitting: Ophthalmology

## 2023-06-17 ENCOUNTER — Encounter (INDEPENDENT_AMBULATORY_CARE_PROVIDER_SITE_OTHER): Payer: Self-pay | Admitting: Ophthalmology

## 2023-06-17 DIAGNOSIS — Z961 Presence of intraocular lens: Secondary | ICD-10-CM

## 2023-06-17 DIAGNOSIS — E113313 Type 2 diabetes mellitus with moderate nonproliferative diabetic retinopathy with macular edema, bilateral: Secondary | ICD-10-CM

## 2023-06-17 DIAGNOSIS — H35033 Hypertensive retinopathy, bilateral: Secondary | ICD-10-CM | POA: Diagnosis not present

## 2023-06-17 DIAGNOSIS — Z794 Long term (current) use of insulin: Secondary | ICD-10-CM

## 2023-06-17 DIAGNOSIS — Z7985 Long-term (current) use of injectable non-insulin antidiabetic drugs: Secondary | ICD-10-CM | POA: Diagnosis not present

## 2023-06-17 DIAGNOSIS — I10 Essential (primary) hypertension: Secondary | ICD-10-CM | POA: Diagnosis not present

## 2023-06-17 MED ORDER — AFLIBERCEPT 2MG/0.05ML IZ SOLN FOR KALEIDOSCOPE
2.0000 mg | INTRAVITREAL | Status: AC | PRN
  Administered 2023-06-17: 2 mg via INTRAVITREAL

## 2023-06-18 ENCOUNTER — Encounter (INDEPENDENT_AMBULATORY_CARE_PROVIDER_SITE_OTHER): Payer: Self-pay | Admitting: Ophthalmology

## 2023-06-18 ENCOUNTER — Ambulatory Visit: Payer: 59 | Admitting: Cardiovascular Disease

## 2023-06-18 ENCOUNTER — Ambulatory Visit: Payer: 59 | Attending: Cardiovascular Disease | Admitting: Cardiovascular Disease

## 2023-06-18 ENCOUNTER — Encounter: Payer: Self-pay | Admitting: Cardiovascular Disease

## 2023-06-18 VITALS — BP 122/64 | HR 84 | Ht 65.0 in | Wt 185.0 lb

## 2023-06-18 DIAGNOSIS — I739 Peripheral vascular disease, unspecified: Secondary | ICD-10-CM

## 2023-06-18 DIAGNOSIS — I2510 Atherosclerotic heart disease of native coronary artery without angina pectoris: Secondary | ICD-10-CM

## 2023-06-18 DIAGNOSIS — I5022 Chronic systolic (congestive) heart failure: Secondary | ICD-10-CM | POA: Diagnosis not present

## 2023-06-18 DIAGNOSIS — E785 Hyperlipidemia, unspecified: Secondary | ICD-10-CM

## 2023-06-18 DIAGNOSIS — I480 Paroxysmal atrial fibrillation: Secondary | ICD-10-CM | POA: Diagnosis not present

## 2023-06-18 NOTE — Progress Notes (Signed)
Cardiology Office Note   Date:  06/18/2023   ID:  William [name], DOB 1951/03/14, MRN [id number]  PCP:  [name], MD  Cardiologist:   Lorine Bears, MD   Chief Complaint  Patient presents with   Follow-up    ABN dopplers.       History of Present Illness: William [name] is a 72 y.o. male who is here today for a follow-up visit regarding peripheral arterial disease, coronary artery disease and paroxysmal atrial fibrillation. He has known history of inferior myocardial infarction complicated by VSD in September, 2017. He is status post one-vessel CABG and VSD repair. He had postoperative atrial fibrillation and has been on anticoagulation. He had amputation of the right great toe while hospitalized for gangrene. The patient has known history of diabetes.  He is known to have peripheral arterial disease. Angiography in November, 2017 showed no significant aortoiliac disease, significant right distal SFA stenosis and one-vessel runoff below the knee via the peroneal artery with reconstitution of the dorsalis pedis distally. I performed successful drug-coated balloon angioplasty of the right SFA. He is s/p right transmetatarsal amputation for osteomyelitis.    He had angiography to the left leg in March 2018 which showed moderate stenosis affecting the left popliteal artery and TP trunk with 1 vessel runoff below the knee via the peroneal artery which was a large dominant vessel and reconstituted the dorsalis pedis and posterior tibial at the level of the ankle. There was brisk flow to the toes. Thus, no revascularization was performed.   He developed a small ulceration between the fifth and fourth toes on the left side and recently had significant swelling in the left foot.  He was treated with multiple rounds of antibiotics.  MRI showed no evidence of osteomyelitis.  The ulceration improved significantly but has not healed completely.  In addition, he is having worsening left foot  discomfort.    He underwent repeat Doppler studies which showed an ABI of 0.72 on the left with evidence of severe stenosis in the left proximal SFA with one-vessel runoff below the knee.    Past Medical History:  Diagnosis Date   AKI (acute kidney injury) ([name])    With STEMI in 2017   Chronic systolic CHF (congestive heart failure) ([name])    Depression    Diabetes mellitus without complication ([name])    Diabetic retinopathy ([name])    GERD (gastroesophageal reflux disease)    Hx of adenomatous colonic polyps 04/07/2018   Hyperlipidemia    Hypertension    Hypertensive retinopathy    Paroxysmal atrial fibrillation ([name])    Peripheral vascular disease ([name])    Pneumonia    Seizures ([name])    hx of as a child   STEMI (ST elevation myocardial infarction) ([name]) 2017    Past Surgical History:  Procedure Laterality Date   ABDOMINAL AORTOGRAM W/LOWER EXTREMITY N/A 09/19/2016   Procedure: Abdominal Aortogram w/Lower Extremity;  Surgeon: Iran Ouch, MD;  Location: [facility];  Service: Cardiovascular;  Laterality: N/A;   AMPUTATION Right 03/23/2016   Procedure: 1st and 2nd Ray Amputation Right Foot;  Surgeon: Nadara Mustard, MD;  Location[facility] OR;  Service: Orthopedics;  Laterality: Right;   AMPUTATION Right 06/21/2016   Procedure: RIGHT TRANSMETATARSAL AMPUTATION;  Surgeon: Nadara Mustard, MD;  Location: [facility] OR;  Service: Orthopedics;  Laterality: Right;   CARDIAC CATHETERIZATION N/A 03/13/2016   Procedure: Right/Left Heart Cath and Coronary Angiography;  Surgeon: Tonny Bollman, MD;  Location: [facility];  Service: Cardiovascular;  Laterality: N/A;   CARDIAC CATHETERIZATION N/A 03/13/2016   Procedure: IABP Insertion;  Surgeon: Tonny Bollman, MD;  Location: [facility] INVASIVE CV LAB;  Service: Cardiovascular;  Laterality: N/A;   CATARACT EXTRACTION     CATARACT EXTRACTION, BILATERAL     CERVICAL FUSION  1982, 1992   has had 3 neck surgeries from breaking his neck    CIRCUMCISION N/A 11/07/2021   Procedure: CIRCUMCISION ADULT;  Surgeon: Despina Arias, MD;  Location: [facility] ORS;  Service: Urology;  Laterality: N/A;   CORONARY ARTERY BYPASS GRAFT N/A 03/13/2016   Procedure: CORONARY ARTERY BYPASS GRAFTING (CABG) x 1 (SVG to OM) with EVH from LEFT GREATER SAPHENOUS VEIN;  Surgeon: Kerin Perna, MD;  Location: [facility] OR;  Service: Open Heart Surgery;  Laterality: N/A;   EYE SURGERY     LOWER EXTREMITY ANGIOGRAM  05/02/2016   Procedure: Lower Extremity Angiogram;  Surgeon: Iran Ouch, MD;  Location: [facility];  Service: Cardiovascular;;  Limited left femoral runoff right femoral runoff   PERIPHERAL VASCULAR CATHETERIZATION N/A 05/02/2016   Procedure: Abdominal Aortogram;  Surgeon: Iran Ouch, MD;  Location: [facility];  Service: Cardiovascular;  Laterality: N/A;   PERIPHERAL VASCULAR CATHETERIZATION Right 05/02/2016   Procedure: Peripheral Vascular Balloon Angioplasty;  Surgeon: Iran Ouch, MD;  Location: [facility];  Service: Cardiovascular;  Laterality: Right;  SFA   TEE WITHOUT CARDIOVERSION N/A 03/13/2016   Procedure: TRANSESOPHAGEAL ECHOCARDIOGRAM (TEE);  Surgeon: Kerin Perna, MD;  Location: [facility] OR;  Service: Open Heart Surgery;  Laterality: N/A;   VSD REPAIR N/A 03/13/2016   Procedure: VENTRICULAR SEPTAL DEFECT (VSD) REPAIR;  Surgeon: Kerin Perna, MD;  Location: [facility] OR;  Service: Open Heart Surgery;  Laterality: N/A;     Current Outpatient Medications  Medication Sig Dispense Refill   acetaminophen (TYLENOL) 500 MG tablet Take 500 mg by mouth every 6 (six) hours as needed for moderate pain.     carvedilol (COREG) 3.125 MG tablet TAKE 1 TABLET(3.125 MG) BY MOUTH TWICE DAILY WITH A MEAL 180 tablet 1   Continuous Glucose Receiver (DEXCOM G7 RECEIVER) DEVI 1 Act by Does not apply route daily. 9 each 1   Continuous Glucose Sensor (DEXCOM G7 SENSOR) MISC 1 Act by Does not apply route daily. 9 each 1   Continuous  Glucose Sensor (FREESTYLE LIBRE 2 SENSOR) MISC APPLY A NEW SENSOR EVERY 14 DAYS 2 each 5   dapagliflozin propanediol (FARXIGA) 10 MG TABS tablet TAKE 1 TABLET(10 MG) BY MOUTH DAILY (Patient taking differently: Take 10 mg by mouth daily.) 90 tablet 0   gabapentin (NEURONTIN) 300 MG capsule TAKE 1 CAPSULE(300 MG) BY MOUTH TWICE DAILY (Patient taking differently: Take 300 mg by mouth 2 (two) times daily.) 180 capsule 0   HUMALOG KWIKPEN 100 UNIT/ML KwikPen INJECT 10 UNITS UNDER THE SKIN THREE TIMES DAILY AS DIRECTED BY SLIDING SCALE (Patient taking differently: Inject 10 Units into the skin in the morning, at noon, and at bedtime.) 27 mL 1   insulin glargine (LANTUS SOLOSTAR) 100 UNIT/ML Solostar Pen Inject 55 Units into the skin at bedtime.     Insulin Pen Needle 31G X 8 MM MISC 1 each by Does not apply route 4 (four) times daily. 300 each 2   OZEMPIC, 0.25 OR 0.5 MG/DOSE, 2 MG/3ML SOPN Inject 0.25 mg into the skin once a week.     pantoprazole (PROTONIX) 40 MG tablet  TAKE 1 TABLET(40 MG) BY MOUTH TWICE DAILY BEFORE A MEAL (Patient taking differently: Take 40 mg by mouth 2 (two) times daily.) 180 tablet 0   pramipexole (MIRAPEX) 0.125 MG tablet TAKE 1 TABLET(0.125 MG) BY MOUTH AT BEDTIME (Patient taking differently: Take 0.125 mg by mouth at bedtime.) 90 tablet 1   rosuvastatin (CRESTOR) 40 MG tablet TAKE 1 TABLET BY MOUTH EVERY DAY (Patient taking differently: Take 40 mg by mouth daily.) 90 tablet 1   silver sulfADIAZINE (SILVADENE) 1 % cream Apply 1 Application topically daily. 400 g 2   torsemide (DEMADEX) 20 MG tablet Take 1 tablet (20 mg total) by mouth every other day. TAKE 1 TABLET(20 MG) BY MOUTH 3 TIMES A WEEK Monday, Wednesday and Friday 38 tablet 0   VASCEPA 1 g capsule TAKE 2 CAPSULES(2 GRAMS) BY MOUTH TWICE DAILY (Patient taking differently: Take 2 g by mouth 2 (two) times daily.) 360 capsule 1   warfarin (COUMADIN) 5 MG tablet Take 1 to 1 &1/2  by mouth daily as directed by the coumadin  clinic (Patient taking differently: Take 5-7.5 mg by mouth See admin instructions. Take 1 to 1 &1/2  by mouth daily as directed by the coumadin clinic Take 7.5 mg by mouth daily (1.5 tablet of 5 mg) on Tues, Thurs, Sat and Sun. Takes 5 mg (one 5 mg tablet) on Mon, Wed and Fri.) 120 tablet 0   No current facility-administered medications for this visit.    Allergies:   Morphine and codeine and Latex    Social History:  The patient  reports that he quit smoking about 7 years ago. His smoking use included cigarettes. He has never used smokeless tobacco. He reports that he does not drink alcohol and does not use drugs.   Family History:  Not able to obtain due to distress.  ROS:  Please see the history of present illness.   Otherwise, review of systems are positive for none.   All other systems are reviewed and negative.    PHYSICAL EXAM: VS:  BP 122/64 (BP Location: Left Arm, Patient Position: Sitting, Cuff Size: Normal)   Pulse 84   Ht 5\' 5"  (1.651 m)   Wt 185 lb (83.9 kg)   BMI 30.79 kg/m  , BMI Body mass index is 30.79 kg/m. GEN: Well nourished, well developed, in no acute distress  HEENT: normal  Neck: no JVD, carotid bruits, or masses Cardiac: RRR; no  rubs, or gallops, . No murmurs Respiratory:  clear to auscultation bilaterally, normal work of breathing GI: soft, nontender, nondistended, + BS MS: no deformity or atrophy  Skin: warm and dry, no rash Neuro:  Strength and sensation are intact Psych: euthymic mood, full affect Distal pulses are not palpable on the left side.  There is slight swelling.  There is a small ulceration between the fourth and fifth toes and a small ulceration at the base of the left big toe.   EKG:  EKG is  not ordered today.   Recent Labs: 11/05/2022: TSH 2.32 06/06/2023: ALT 43; BUN 39; Creatinine, Ser 2.02; Hemoglobin 11.2; Platelets 136; Potassium 3.8; Sodium 139    Lipid Panel    Component Value Date/Time   CHOL 126 11/05/2022 1141   CHOL  90 (L) 01/05/2019 1123   TRIG 290.0 (H) 11/05/2022 1141   HDL 32.10 (L) 11/05/2022 1141   HDL 34 (L) 01/05/2019 1123   CHOLHDL 4 11/05/2022 1141   VLDL 58.0 (H) 11/05/2022 1141   LDLCALC 43 05/07/2022  1354   LDLCALC 24 01/05/2019 1123   LDLCALC 45 03/20/2017 1144   LDLDIRECT 54.0 11/05/2022 1141      Wt Readings from Last 3 Encounters:  06/18/23 185 lb (83.9 kg)  06/06/23 195 lb 12.3 oz (88.8 kg)  05/23/23 195 lb 12.8 oz (88.8 kg)          No data to display             ASSESSMENT AND PLAN:  1.  Peripheral arterial disease :  He is status post drug-coated balloon angioplasty of the right SFA in 2017. He has significant tibial disease bilaterally.  I agree that the ulceration between the left fourth and fifth toes does not appear ischemic in morphology.  However, he is having significant discomfort in left foot pain and recent Doppler studies showed severe stenosis in the left SFA with one-vessel runoff below the knee.  Due to that, I recommend proceeding with abdominal aortogram with lower extremity angiography and possible endovascular intervention.  I discussed the procedure in details as well as risk and benefits.  Hold warfarin 4 days before the procedure and start aspirin 81 mg daily.  He does have underlying chronic kidney disease with creatinine around 2.  Will hydrate 4 hours before the procedure and plan on using CO2.   2. Chronic systolic heart failure: He appears to be euvolemic.   He is no longer on losartan due to low blood pressure and chronic kidney disease.  Most recent echo in 2018 showed an EF of 45 to 50%.  He appears to be euvolemic on current dose of torsemide 20 mg 3 times a week.  Continue small dose carvedilol and [place].    3. Paroxysmal atrial fibrillation: He is maintaining in sinus rhythm.  He is on long-term anticoagulation with warfarin with no bleeding complications.  His INR has been therapeutic.  4. Coronary artery disease involving native  coronary arteries without angina:  status post one-vessel CABG and VSD repair. Continue medical therapy.  5. Diabetes mellitus: Improved from before.  6. Hyperlipidemia: Recent lipid profile showed an LDL of 54.  Continue rosuvastatin.   Disposition:   Proceed with angiography next month.  Signed,  Lorine Bears, MD  06/18/2023 10:11 AM    [facility]

## 2023-06-18 NOTE — Patient Instructions (Signed)
Medication Instructions:  No changes *If you need a refill on your cardiac medications before your next appointment, please call your pharmacy*   Lab Work: Your provider would like for you to return on 07/10/23 to have the following labs drawn: CBC, BMET and PT/INR. You do not need an appointment for the lab. Once in our office lobby there is a podium where you can sign in and ring the doorbell to alert [place] that you are here. The lab is open from 8:00 am to 4 pm; closed for lunch from 12:45pm-1:45pm.  You may also go to any of these [facility] locations:   [facility] - [address] ([facility]) - [address] [phone] [address]    [facility]  - [address]    If you have labs (blood work) drawn today and your tests are completely normal, you will receive your results only by: [employer] (if you have MyChart) OR A paper copy in the mail If you have any lab test that is abnormal or we need to change your treatment, we will call you to review the results.   Testing/Procedures: Your physician has requested that you have a peripheral vascular angiogram. This exam is performed at the hospital. During this exam IV contrast is used to look at arterial blood flow. Please review the information sheet given for details.    Follow-Up: At [facility], you and your health needs are our priority.  As part of our continuing mission to provide you with exceptional heart care, we have created designated Provider Care Teams.  These Care Teams include your primary Cardiologist (physician) and Advanced Practice Providers (APPs -  Physician Assistants and Nurse Practitioners) who all work together to provide you with the care you need, when you need it.  We recommend signing up for the patient portal called "MyChart".  Sign up information is provided on this After Visit Summary.  MyChart is used to connect with patients for  Virtual Visits (Telemedicine).  Patients are able to view lab/test results, encounter notes, upcoming appointments, etc.  Non-urgent messages can be sent to your provider as well.   To learn more about what you can do with MyChart, go to [email or web address].    Your next appointment:   Keep your post procedure follow up on 2/11 with Dr. Kirke Corin  Other Instructions  [facility] A DEPT OF [place]. [facility] AT [facility] [address] [place] [address] [place] [address] Dept: [phone] Loc: [phone]  Shayan Kowitz  06/18/2023  You are scheduled for a Peripheral Angiogram on Wednesday, January 15 with Dr. Lorine Bears.  1. Please arrive at the [facility] (Main Entrance A) at [facility]: [address] [place], [place] [address] at 8:30 AM (This time is 4 hour(s) before your procedure to ensure your preparation).   Free valet parking service is available. You will check in at ADMITTING. The support person will be asked to wait in the waiting room.  It is OK to have someone drop you off and come back when you are ready to be discharged.    Special note: Every effort is made to have your procedure done on time. Please understand that emergencies sometimes delay scheduled procedures.  2. Diet: Do not eat solid foods after midnight.  The patient may have clear liquids until 5am upon the day of the procedure.  3. Labs: You will need to have blood drawn on 07/10/23. See above notes. You do not need to be fasting.  4. Medication instructions in preparation for your procedure: Hold all diabetic medication the morning of the procedure  -take half the dose of the Lantus the night before the procedure Hold the Warfarin 4 days prior to the procedure (Hold 1/11-1/15)  -Take Aspirin 81 mg for the four days that you are off the warfarin Hold the Torsemide the morning of the procedure  On the morning of your procedure, take your  Aspirin 81 mg and any morning medicines NOT listed above.  You may use sips of water.  5. Plan to go home the same day, you will only stay overnight if medically necessary. 6. Bring a current list of your medications and current insurance cards. 7. You MUST have a responsible person to drive you home. 8. Someone MUST be with you the first 24 hours after you arrive home or your discharge will be delayed. 9. Please wear clothes that are easy to get on and off and wear slip-on shoes.  Thank you for allowing [place] to care for you!   -- [facility]

## 2023-06-19 ENCOUNTER — Other Ambulatory Visit: Payer: Self-pay | Admitting: Cardiovascular Disease

## 2023-06-19 NOTE — Telephone Encounter (Signed)
Refill Request.  

## 2023-07-01 ENCOUNTER — Ambulatory Visit: Payer: 59 | Attending: Cardiovascular Disease

## 2023-07-01 DIAGNOSIS — I480 Paroxysmal atrial fibrillation: Secondary | ICD-10-CM

## 2023-07-01 DIAGNOSIS — Z5181 Encounter for therapeutic drug level monitoring: Secondary | ICD-10-CM

## 2023-07-01 LAB — POCT INR: INR: 2.7 (ref 2.0–3.0)

## 2023-07-01 NOTE — Patient Instructions (Signed)
Description   Take 1/2 tablet today then continue taking 1.5 tablets daily except for 1 tablet every Mondays, Wednesdays and Fridays. Recheck INR in 1 week after procedure on 07/17/23.  Anticoagulation Clinic [phone]

## 2023-07-04 NOTE — Progress Notes (Signed)
 [facility] - Clinic Note  07/15/2023    CHIEF COMPLAINT Patient presents for Retina Follow Up  HISTORY OF PRESENT ILLNESS: William [name] is a 73 y.o. male who presents to the clinic today for:   HPI     Retina Follow Up   Patient presents with  Diabetic Retinopathy.  In both eyes.  This started 4 weeks ago.  I, the attending physician,  performed the HPI with the patient and updated documentation appropriately.        Comments   Patient here for 4 weeks retina follow up for NPDR OU/focal laser OD today. Patient states vision about the same. Sometimes has pain in OD. Using drops.       Last edited by Valdemar Rogue, MD on 07/15/2023  8:40 AM.    Pt states  Referring physician: Joshua Debby CROME, MD [address] [place],  [place] [address]  HISTORICAL INFORMATION:  Selected notes from the MEDICAL RECORD NUMBER Referred by Dr. Lelon for eval of DME OU LEE:  Ocular Hx- PMH-    CURRENT MEDICATIONS: Current Outpatient Medications (Ophthalmic Drugs)  Medication Sig   prednisoLONE  acetate (PRED FORTE ) 1 % ophthalmic suspension Place 1 drop into the right eye 4 (four) times daily for 7 days.   No current facility-administered medications for this visit. (Ophthalmic Drugs)   Current Outpatient Medications (Other)  Medication Sig   acetaminophen  (TYLENOL ) 500 MG tablet Take 500 mg by mouth every 6 (six) hours as needed for moderate pain.   aspirin  EC 81 MG tablet Take 81 mg by mouth daily. Swallow whole.   carvedilol  (COREG ) 3.125 MG tablet TAKE 1 TABLET(3.125 MG) BY MOUTH TWICE DAILY WITH A MEAL   Continuous Glucose Receiver (DEXCOM G7 RECEIVER) DEVI 1 Act by Does not apply route daily.   Continuous Glucose Sensor (DEXCOM G7 SENSOR) MISC 1 Act by Does not apply route daily.   dapagliflozin  propanediol (FARXIGA ) 10 MG TABS tablet TAKE 1 TABLET(10 MG) BY MOUTH DAILY   gabapentin  (NEURONTIN ) 300 MG capsule TAKE 1 CAPSULE(300 MG) BY MOUTH TWICE DAILY (Patient  taking differently: Take 300 mg by mouth 2 (two) times daily.)   HUMALOG  KWIKPEN 100 UNIT/ML KwikPen INJECT 10 UNITS UNDER THE SKIN THREE TIMES DAILY AS DIRECTED BY SLIDING SCALE (Patient taking differently: Inject 4 Units into the skin in the morning, at noon, and at bedtime. Sliding scale)   insulin  glargine (LANTUS  SOLOSTAR) 100 UNIT/ML Solostar Pen Inject 55 Units into the skin at bedtime.   Insulin  Pen Needle 31G X 8 MM MISC 1 each by Does not apply route 4 (four) times daily.   OZEMPIC , 0.25 OR 0.5 MG/DOSE, 2 MG/3ML SOPN Inject 0.25 mg into the skin once a week.   pantoprazole  (PROTONIX ) 40 MG tablet TAKE 1 TABLET(40 MG) BY MOUTH TWICE DAILY BEFORE A MEAL   pramipexole  (MIRAPEX ) 0.125 MG tablet TAKE 1 TABLET(0.125 MG) BY MOUTH AT BEDTIME   rosuvastatin  (CRESTOR ) 40 MG tablet TAKE 1 TABLET BY MOUTH EVERY DAY   silver  sulfADIAZINE  (SILVADENE ) 1 % cream Apply 1 Application topically daily. (Patient taking differently: Apply 1 Application topically daily as needed (Wound).)   torsemide  (DEMADEX ) 20 MG tablet Take 1 tablet (20 mg total) by mouth every other day. TAKE 1 TABLET(20 MG) BY MOUTH 3 TIMES A WEEK Monday, Wednesday and Friday (Patient taking differently: Take 20 mg by mouth every Monday, Wednesday, and Friday.)   warfarin (COUMADIN ) 5 MG tablet TAKE 1 TO 1 AND 1/2  TABLETS BY MOUTH DAILY AS DIRECTED BY COUMADIN  CLINIC (Patient taking differently: Take 5-7.5 mg by mouth See admin instructions. Take 5 mg on Mon., Wed., And Fri., All the other days take 7.5 mg  BY MOUTH DAILY AS DIRECTED BY COUMADIN  CLINIC)   Continuous Glucose Sensor (FREESTYLE LIBRE 2 SENSOR) MISC APPLY A NEW SENSOR EVERY 14 DAYS (Patient not taking: No sig reported)   VASCEPA  1 g capsule TAKE 2 CAPSULES(2 GRAMS) BY MOUTH TWICE DAILY (Patient not taking: No sig reported)   No current facility-administered medications for this visit. (Other)   REVIEW OF SYSTEMS: ROS   Positive for: Gastrointestinal, Genitourinary,  Endocrine, Eyes Negative for: Constitutional, Neurological, Skin, Musculoskeletal, HENT, Cardiovascular, Respiratory, Psychiatric, Allergic/Imm, Heme/Lymph Last edited by Orval Asberry RAMAN, COA on 07/15/2023  7:49 AM.     ALLERGIES Allergies  Allergen Reactions   Morphine And Codeine  Shortness Of Breath and Other (See Comments)    UNSPECIFIED REACTION Pt said it was too much    Latex Rash   PAST MEDICAL HISTORY Past Medical History:  Diagnosis Date   AKI (acute kidney injury) ([name])    With STEMI in 2017   Chronic systolic CHF (congestive heart failure) ([name])    Depression    Diabetes mellitus without complication ([name])    Diabetic retinopathy ([name])    GERD (gastroesophageal reflux disease)    Hx of adenomatous colonic polyps 04/07/2018   Hyperlipidemia    Hypertension    Hypertensive retinopathy    Paroxysmal atrial fibrillation ([name])    Peripheral vascular disease ([name])    Pneumonia    Seizures ([name])    hx of as a child   STEMI (ST elevation myocardial infarction) ([name]) 2017   Past Surgical History:  Procedure Laterality Date   ABDOMINAL AORTOGRAM W/LOWER EXTREMITY N/A 09/19/2016   Procedure: Abdominal Aortogram w/Lower Extremity;  Surgeon: Deatrice DELENA Cage, MD;  Location: [facility] INVASIVE CV LAB;  Service: Cardiovascular;  Laterality: N/A;   AMPUTATION Right 03/23/2016   Procedure: 1st and 2nd Ray Amputation Right Foot;  Surgeon: Jerona LULLA Sage, MD;  Location: [facility] OR;  Service: Orthopedics;  Laterality: Right;   AMPUTATION Right 06/21/2016   Procedure: RIGHT TRANSMETATARSAL AMPUTATION;  Surgeon: Jerona LULLA Sage, MD;  Location: [facility] OR;  Service: Orthopedics;  Laterality: Right;   CARDIAC CATHETERIZATION N/A 03/13/2016   Procedure: Right/Left Heart Cath and Coronary Angiography;  Surgeon: Ozell Fell, MD;  Location: [facility] INVASIVE CV LAB;  Service: Cardiovascular;  Laterality: N/A;   CARDIAC CATHETERIZATION N/A 03/13/2016   Procedure: IABP Insertion;  Surgeon: Ozell Fell, MD;   Location: [facility] INVASIVE CV LAB;  Service: Cardiovascular;  Laterality: N/A;   CATARACT EXTRACTION     CATARACT EXTRACTION, BILATERAL     CERVICAL FUSION  1982, 1992   has had 3 neck surgeries from breaking his neck   CIRCUMCISION N/A 11/07/2021   Procedure: CIRCUMCISION ADULT;  Surgeon: Lovie Arlyss CROME, MD;  Location: [facility] ORS;  Service: Urology;  Laterality: N/A;   CORONARY ARTERY BYPASS GRAFT N/A 03/13/2016   Procedure: CORONARY ARTERY BYPASS GRAFTING (CABG) x 1 (SVG to OM) with EVH from LEFT GREATER SAPHENOUS VEIN;  Surgeon: Maude Fleeta Ochoa, MD;  Location: [facility] OR;  Service: Open Heart Surgery;  Laterality: N/A;   EYE SURGERY     LOWER EXTREMITY ANGIOGRAM  05/02/2016   Procedure: Lower Extremity Angiogram;  Surgeon: Deatrice DELENA Cage, MD;  Location: [facility] INVASIVE CV LAB;  Service: Cardiovascular;;  Limited left femoral runoff right femoral runoff  PERIPHERAL VASCULAR CATHETERIZATION N/A 05/02/2016   Procedure: Abdominal Aortogram;  Surgeon: Deatrice DELENA Cage, MD;  Location: [facility]VAS[facility] CV LAB;  Service: Cardiovascular;  Laterality: N/A;   PERIPHERAL VASCULAR CATHETERIZATION Right 05/02/2016   Procedure: Peripheral Vascular Balloon Angioplasty;  Surgeon: Deatrice DELENA Cage, MD;  Location: [facility] INVASIVE CV LAB;  Service: Cardiovascular;  Laterality: Right;  SFA   TEE WITHOUT CARDIOVERSION N/A 03/13/2016   Procedure: TRANSESOPHAGEAL ECHOCARDIOGRAM (TEE);  Surgeon: Maude Fleeta Ochoa, MD;  Location: [facility] OR;  Service: Open Heart Surgery;  Laterality: N/A;   VSD REPAIR N/A 03/13/2016   Procedure: VENTRICULAR SEPTAL DEFECT (VSD) REPAIR;  Surgeon: Maude Fleeta Ochoa, MD;  Location[facility] OR;  Service: Open Heart Surgery;  Laterality: N/A;   FAMILY HISTORY Family History  Problem Relation Age of Onset   Diabetes Maternal Grandmother    Diabetes Mother    Aneurysm Mother    Peripheral Artery Disease Mother    Coronary artery disease Mother    Peptic Ulcer Father    Retinoblastoma Daughter    Colon cancer Neg Hx     Rectal cancer Neg Hx    SOCIAL HISTORY Social History   Tobacco Use   Smoking status: Former    Current packs/day: 0.00    Types: Cigarettes    Quit date: 11/20/2015    Years since quitting: 7.6   Smokeless tobacco: Never   Tobacco comments:    quit 2018  Vaping Use   Vaping status: Never Used  Substance Use Topics   Alcohol use: No   Drug use: No       OPHTHALMIC EXAM: Base Eye Exam     Visual Acuity (Snellen - Linear)       Right Left   Dist [place] 20/25 -1 20/20         Tonometry (Tonopen, 7:47 AM)       Right Left   Pressure 12 11         Pupils       Dark Light Shape React APD   Right 2 1 Round Brisk None   Left 2 1 Round Brisk None         Visual Fields (Counting fingers)       Left Right    Full Full         Extraocular Movement       Right Left    Full, Ortho Full, Ortho         Neuro/Psych     Oriented x3: Yes   Mood/Affect: Normal         Dilation     Both eyes: 1.0% Mydriacyl, 2.5% Phenylephrine  @ 7:47 AM           Slit Lamp and Fundus Exam     Slit Lamp Exam       Right Left   Lids/Lashes Dermatochalasis - upper lid, mild MGD Dermatochalasis - upper lid, mild MGD   Conjunctiva/Sclera White and quiet White and quiet   Cornea 1+fine PEE, well healed cataract wound, mild tear film debris Trace PEE, mild tear film debris, well healed cataract wound   Anterior Chamber Deep and quiet Deep and quiet   Iris Round and dilated, No NVI Round and dilated, No NVI   Lens PC IOL in good position PC IOL in good position   Anterior Vitreous Vitreous syneresis, vitreous condensations Vitreous syneresis, Posterior vitreous detachment, vitreous condensations         Fundus Exam  Right Left   Disc trace Pallor, Sharp rim mild Pallor, Sharp rim, mild tilt   C/D Ratio 0.3 0.3   Macula Flat, good foveal reflex, scatted Microaneurysms, persistent cystic changes -- slightly increased superiorly, possible targets for focal  laser superiorly Flat, good foveal reflex, scattered MA -- improved, interval improvement in cystic changes temporal mac, good focal laser changes   Vessels attenuated, mild tortuosity attenuated, Tortuous   Periphery Attached, +MA greatest posteriorly Attached, scattered MA greatest posteriorly           IMAGING AND PROCEDURES  Imaging and Procedures for 07/15/2023  OCT, Retina - OU - Both Eyes       Right Eye Quality was good. Central Foveal Thickness: 262. Progression has worsened. Findings include no SRF, abnormal foveal contour, intraretinal hyper-reflective material, intraretinal fluid, vitreomacular adhesion (persistent IRF/cystic changes superior fovea and mac -- slightly increased).   Left Eye Quality was good. Central Foveal Thickness: 244. Progression has been stable. Findings include normal foveal contour, no SRF, intraretinal hyper-reflective material, intraretinal fluid (persistent IRF/ cystic changes temporal macula).   Notes *Images captured and stored on drive  Diagnosis / Impression:  +DME OU OD: persistent IRF/cystic changes superior fovea and mac -- slightly increased OS: peristent IRF/ cystic changes temporal macula  Clinical management:  See below  Abbreviations: NFP - Normal foveal profile. CME - cystoid macular edema. PED - pigment epithelial detachment. IRF - intraretinal fluid. SRF - subretinal fluid. EZ - ellipsoid zone. ERM - epiretinal membrane. ORA - outer retinal atrophy. ORT - outer retinal tubulation. SRHM - subretinal hyper-reflective material. IRHM - intraretinal hyper-reflective material      Focal Laser - OD - Right Eye       LASER PROCEDURE NOTE  Diagnosis:   Diabetic macular edema, RIGHT EYE  Procedure:  Focal laser photocoagulation using slit lamp laser, RIGHT EYE  Anesthesia:  Topical  Surgeon: Redell Hans, MD, PhD   Informed consent obtained, operative eye marked, and time out performed prior to initiation of laser.    Lumenis [id number] Focal/Grid laser Power: 80 mW Duration: 50 msec  Spot size: 100 microns  # spots: 94 spots placed to parafoveal MAs  Complications: None.  RTC: Jan 27 or later -- DFE/OCT, possible injxn(s)  Patient tolerated the procedure well and received written and verbal post-procedure care information/education.             ASSESSMENT/PLAN:    ICD-10-CM   1. Moderate nonproliferative diabetic retinopathy of both eyes with macular edema associated with type 2 diabetes mellitus ([name])  E11.3313 OCT, Retina - OU - Both Eyes    Focal Laser - OD - Right Eye    2. Current use of insulin  ([name])  Z79.4     3. Long-term (current) use of injectable non-insulin  antidiabetic drugs  Z79.85     4. Essential hypertension  I10     5. Hypertensive retinopathy of both eyes  H35.033     6. Pseudophakia, both eyes  Z96.1      1-3. Moderate non-proliferative diabetic retinopathy, both eyes  - last A1c was 8.0 on 11.06.23  - s/p IVA OD #1 (11.14.22), #2 (12.12.22), #3 (01.09.23) -- IVA resistance - s/p IVA OS #1 (10.17.22), #2 (11.14.22), #3 (12.12.22), #4 (01.09.23), #5 (03.15.23), #6 (04.12.23) -- IVA resistance - s/p IVE OD #1 (03.15.23), #2 (04.12.23), #3 (05.10.23), #4 (06.07.23), #5 (07.05.23), #6 (08.02.23), #7 (08.31.23), #8 (09.28.23), #9 (10.30.23), #10 (11.28.23), #11 (12.28.23), #12 (01.29.24), #13 (02.20.24), #14 (03.25.24), #15 (  04.22.24), #16 (05.20.24), #17 (06.17.24), #18 (07.22.24), #19 (08.26.24), #20 (09.30.24), #21 (11.04.24), #22 (12.16.24) - s/p IVE OS #1 (05.10.23), #2 (06.07.23), #3 (07.05.23), #4 (08.02.23), #5 (08.31.23), #6 (09.28.23), #7 (10.30.23), #8 (11.28.23), #9 (12.28.23), #10 (01.29.24), #11 (02.20..24), #12 (03.25.24), #13 (04.22.24), #14 (05.20.24), #15 (06.17.24), #16 (07.22.24), #17 (09.30.24), #18 (11.04.24), #19 (12.16.24) - s/p focal laser OS (06.25.24) - exam shows scattered MA, DBH OU - FA (10.17.22) shows late leaking MA OU, no NV OU - OCT  shows OD: Mild interval increase in IRF/cystic changes superior fovea and mac; OS: interval improvement in IRF/ cystic changes temporal macula at 4 wks - BCVA 20/25 OU **discussed decreased efficacy / resistance to Eylea  and potential benefit of switching medication* - recommend focal laser OD today, 01.13.24 - pt wishes to proceed with laser - RBA of procedure discussed, questions answered - informed consent obtained and signed - see procedure note - IVE informed consent obtained and signed, 11.04.24 (OU) - will check [employer] auth for 2025 - follow up January 27th or later for DFE/OCT, injections OU  4,5. Hypertensive retinopathy OU - discussed importance of tight BP control and possibility of elevated BP blunting efficacy of IVE  - monitor  6. Pseudophakia OU  - s/p CE/IOL OU  - IOL in good position, doing well  - monitor  Ophthalmic Meds Ordered this visit:  Meds ordered this encounter  Medications   prednisoLONE  acetate (PRED FORTE ) 1 % ophthalmic suspension    Sig: Place 1 drop into the right eye 4 (four) times daily for 7 days.    Dispense:  10 mL    Refill:  0     Return for f/u January 27th or later, NPDR OU, DFE, OCT.  There are no Patient Instructions on file for this visit.  Explained the diagnoses, plan, and follow up with the patient and they expressed understanding.  Patient expressed understanding of the importance of proper follow up care.   This document serves as a record of services personally performed by Redell JUDITHANN Hans, MD, PhD. It was created on their behalf by Alan PARAS. Delores, OA an ophthalmic technician. The creation of this record is the provider's dictation and/or activities during the visit.    Electronically signed by: Alan PARAS. Delores, OA 07/15/23 12:19 PM  Redell JUDITHANN Hans, M.D., Ph.D. Diseases & Surgery of the Retina and Vitreous [facility]  I have reviewed the above documentation for accuracy and completeness, and I  agree with the above. Redell JUDITHANN Hans, M.D., Ph.D. 07/15/23 12:22 PM  Abbreviations: M myopia (nearsighted); A astigmatism; H hyperopia (farsighted); P presbyopia; Mrx spectacle prescription;  CTL contact lenses; OD right eye; OS left eye; OU both eyes  XT exotropia; ET esotropia; PEK punctate epithelial keratitis; PEE punctate epithelial erosions; DES dry eye syndrome; MGD meibomian gland dysfunction; ATs artificial tears; PFAT's preservative free artificial tears; NSC nuclear sclerotic cataract; PSC posterior subcapsular cataract; ERM epi-retinal membrane; PVD posterior vitreous detachment; RD retinal detachment; DM diabetes mellitus; DR diabetic retinopathy; NPDR non-proliferative diabetic retinopathy; PDR proliferative diabetic retinopathy; CSME clinically significant macular edema; DME diabetic macular edema; dbh dot blot hemorrhages; CWS cotton wool spot; POAG primary open angle glaucoma; C/D cup-to-disc ratio; HVF humphrey visual field; GVF goldmann visual field; OCT optical coherence tomography; IOP intraocular pressure; BRVO Branch retinal vein occlusion; CRVO central retinal vein occlusion; CRAO central retinal artery occlusion; BRAO branch retinal artery occlusion; RT retinal tear; SB scleral buckle; PPV pars plana vitrectomy; VH Vitreous  hemorrhage; PRP panretinal laser photocoagulation; IVK intravitreal kenalog; VMT vitreomacular traction; MH Macular hole;  NVD neovascularization of the disc; NVE neovascularization elsewhere; AREDS age related eye disease study; ARMD age related macular degeneration; POAG primary open angle glaucoma; EBMD epithelial/anterior basement membrane dystrophy; ACIOL anterior chamber intraocular lens; IOL intraocular lens; PCIOL posterior chamber intraocular lens; Phaco/IOL phacoemulsification with intraocular lens placement; PRK photorefractive keratectomy; LASIK laser assisted in situ keratomileusis; HTN hypertension; DM diabetes mellitus; COPD chronic obstructive  pulmonary disease

## 2023-07-09 DIAGNOSIS — N1832 Chronic kidney disease, stage 3b: Secondary | ICD-10-CM | POA: Diagnosis not present

## 2023-07-09 LAB — RENAL FUNCTION PANEL: EGFR: 47

## 2023-07-15 ENCOUNTER — Encounter (INDEPENDENT_AMBULATORY_CARE_PROVIDER_SITE_OTHER): Payer: Self-pay | Admitting: Ophthalmology

## 2023-07-15 ENCOUNTER — Ambulatory Visit (INDEPENDENT_AMBULATORY_CARE_PROVIDER_SITE_OTHER): Payer: 59 | Admitting: Ophthalmology

## 2023-07-15 ENCOUNTER — Telehealth: Payer: Self-pay | Admitting: *Deleted

## 2023-07-15 DIAGNOSIS — Z961 Presence of intraocular lens: Secondary | ICD-10-CM

## 2023-07-15 DIAGNOSIS — I10 Essential (primary) hypertension: Secondary | ICD-10-CM | POA: Diagnosis not present

## 2023-07-15 DIAGNOSIS — H35033 Hypertensive retinopathy, bilateral: Secondary | ICD-10-CM

## 2023-07-15 DIAGNOSIS — Z794 Long term (current) use of insulin: Secondary | ICD-10-CM | POA: Diagnosis not present

## 2023-07-15 DIAGNOSIS — Z7985 Long-term (current) use of injectable non-insulin antidiabetic drugs: Secondary | ICD-10-CM | POA: Diagnosis not present

## 2023-07-15 DIAGNOSIS — E113313 Type 2 diabetes mellitus with moderate nonproliferative diabetic retinopathy with macular edema, bilateral: Secondary | ICD-10-CM

## 2023-07-15 MED ORDER — PREDNISOLONE ACETATE 1 % OP SUSP: 1.0000 [drp] | Freq: Four times a day (QID) | OPHTHALMIC | 0 refills | Status: AC

## 2023-07-15 NOTE — Telephone Encounter (Unsigned)
 Copied from CRM [id number]. Topic: Clinical - Medical Advice >> Jul 15, 2023  9:45 AM William [name] wrote: Reason for CRM: Patient's daughter is calling because the dexcome is not working and they have not been able to check patient's glucose readings. They have reached out to [employer] but it will take some time to get a replacement. She was wondering if they can get a prescription for a accu-check aviva glucose meter and test strips.

## 2023-07-15 NOTE — Telephone Encounter (Addendum)
 Abdominal aortogram scheduled at [facility] for: Wednesday July 17, 2023 1 PM Arrival time [facility] Main Entrance A at: 8:30 AM-pre-procedure hydration  Nothing to eat after midnight prior to procedure, clear liquids until 5 AM day of procedure.  Medication instructions: -Hold:  Coumadin /warfarin -none 07/13/23 until post procedure  Torsemide  -day before and day of procedure-per protocol GFR <60 (35 -06/06/23) -takes MWF  Farxiga -AM of procedure  Insulin -AM of procedure 1/2 usual Insulin  dose HS prior to procedure -Other usual morning medications can be taken with sips of water including aspirin  81 mg.  Patient's daughter reports pt's last dose Ozempic  07/11/23.  Plan to go home the same day, you will only stay overnight if medically necessary.  You must have responsible adult to drive you home.  Someone must be with you the first 24 hours after you arrive home.  Reviewed procedure instructions/ pre-procedure hydration with patient's daughter(DPR), Rosaline.  Renal panel done 07/09/23 at [facility] scanned to Epic Lab tab, CBC not done, will get on arrival to hospital AM of procedure, [facility] aware.

## 2023-07-17 ENCOUNTER — Other Ambulatory Visit: Payer: Self-pay | Admitting: *Deleted

## 2023-07-17 ENCOUNTER — Ambulatory Visit (HOSPITAL_COMMUNITY)
Admission: RE | Admit: 2023-07-17 | Discharge: 2023-07-17 | Disposition: A | Discharge status: Home / Self Care | Payer: 59 | Attending: Cardiovascular Disease | Admitting: Cardiovascular Disease

## 2023-07-17 ENCOUNTER — Encounter (HOSPITAL_COMMUNITY)
Admission: RE | Disposition: A | Discharge status: Home / Self Care | Payer: 59 | Source: Home / Self Care | Attending: Cardiovascular Disease

## 2023-07-17 ENCOUNTER — Other Ambulatory Visit: Payer: Self-pay

## 2023-07-17 DIAGNOSIS — I70212 Atherosclerosis of native arteries of extremities with intermittent claudication, left leg: Secondary | ICD-10-CM | POA: Diagnosis not present

## 2023-07-17 DIAGNOSIS — Z87891 Personal history of nicotine dependence: Secondary | ICD-10-CM | POA: Insufficient documentation

## 2023-07-17 DIAGNOSIS — Z951 Presence of aortocoronary bypass graft: Secondary | ICD-10-CM | POA: Diagnosis not present

## 2023-07-17 DIAGNOSIS — I739 Peripheral vascular disease, unspecified: Secondary | ICD-10-CM

## 2023-07-17 DIAGNOSIS — I70245 Atherosclerosis of native arteries of left leg with ulceration of other part of foot: Secondary | ICD-10-CM | POA: Insufficient documentation

## 2023-07-17 DIAGNOSIS — I2510 Atherosclerotic heart disease of native coronary artery without angina pectoris: Secondary | ICD-10-CM | POA: Diagnosis not present

## 2023-07-17 DIAGNOSIS — Z794 Long term (current) use of insulin: Secondary | ICD-10-CM | POA: Insufficient documentation

## 2023-07-17 DIAGNOSIS — Z7901 Long term (current) use of anticoagulants: Secondary | ICD-10-CM | POA: Insufficient documentation

## 2023-07-17 DIAGNOSIS — I480 Paroxysmal atrial fibrillation: Secondary | ICD-10-CM | POA: Insufficient documentation

## 2023-07-17 DIAGNOSIS — E1151 Type 2 diabetes mellitus with diabetic peripheral angiopathy without gangrene: Secondary | ICD-10-CM | POA: Diagnosis not present

## 2023-07-17 DIAGNOSIS — N189 Chronic kidney disease, unspecified: Secondary | ICD-10-CM | POA: Insufficient documentation

## 2023-07-17 DIAGNOSIS — I5022 Chronic systolic (congestive) heart failure: Secondary | ICD-10-CM | POA: Diagnosis not present

## 2023-07-17 DIAGNOSIS — E1122 Type 2 diabetes mellitus with diabetic chronic kidney disease: Secondary | ICD-10-CM | POA: Insufficient documentation

## 2023-07-17 DIAGNOSIS — E11621 Type 2 diabetes mellitus with foot ulcer: Secondary | ICD-10-CM | POA: Insufficient documentation

## 2023-07-17 DIAGNOSIS — Z7985 Long-term (current) use of injectable non-insulin antidiabetic drugs: Secondary | ICD-10-CM | POA: Diagnosis not present

## 2023-07-17 DIAGNOSIS — I130 Hypertensive heart and chronic kidney disease with heart failure and stage 1 through stage 4 chronic kidney disease, or unspecified chronic kidney disease: Secondary | ICD-10-CM | POA: Diagnosis not present

## 2023-07-17 DIAGNOSIS — L97529 Non-pressure chronic ulcer of other part of left foot with unspecified severity: Secondary | ICD-10-CM | POA: Insufficient documentation

## 2023-07-17 DIAGNOSIS — E785 Hyperlipidemia, unspecified: Secondary | ICD-10-CM | POA: Insufficient documentation

## 2023-07-17 DIAGNOSIS — Z7982 Long term (current) use of aspirin: Secondary | ICD-10-CM | POA: Diagnosis not present

## 2023-07-17 DIAGNOSIS — Z7984 Long term (current) use of oral hypoglycemic drugs: Secondary | ICD-10-CM | POA: Insufficient documentation

## 2023-07-17 DIAGNOSIS — Z79899 Other long term (current) drug therapy: Secondary | ICD-10-CM | POA: Diagnosis not present

## 2023-07-17 DIAGNOSIS — Z01812 Encounter for preprocedural laboratory examination: Secondary | ICD-10-CM

## 2023-07-17 HISTORY — PX: ABDOMINAL AORTOGRAM W/LOWER EXTREMITY: CATH118223

## 2023-07-17 HISTORY — PX: PERIPHERAL VASCULAR BALLOON ANGIOPLASTY: CATH118281

## 2023-07-17 LAB — GLUCOSE, CAPILLARY
Glucose-Capillary: 152 mg/dL — ABNORMAL HIGH (ref 70–99)
Glucose-Capillary: 123 mg/dL — ABNORMAL HIGH (ref 70–99)

## 2023-07-17 LAB — CBC
HCT: 41.9 % (ref 39.0–52.0)
Hemoglobin: 14 g/dL (ref 13.0–17.0)
MCH: 29.7 pg (ref 26.0–34.0)
MCHC: 33.4 g/dL (ref 30.0–36.0)
MCV: 89 fL (ref 80.0–100.0)
Platelets: 149 10*3/uL — ABNORMAL LOW (ref 150–400)
RBC: 4.71 MIL/uL (ref 4.22–5.81)
RDW: 14.8 % (ref 11.5–15.5)
WBC: 5.5 10*3/uL (ref 4.0–10.5)
nRBC: 0 % (ref 0.0–0.2)

## 2023-07-17 LAB — BASIC METABOLIC PANEL
Anion gap: 7 (ref 5–15)
BUN: 29 mg/dL — ABNORMAL HIGH (ref 8–23)
CO2: 25 mmol/L (ref 22–32)
Calcium: 9.7 mg/dL (ref 8.9–10.3)
Chloride: 107 mmol/L (ref 98–111)
Creatinine, Ser: 1.5 mg/dL — ABNORMAL HIGH (ref 0.61–1.24)
GFR, Estimated: 49 mL/min — ABNORMAL LOW (ref >60)
Glucose, Bld: 171 mg/dL — ABNORMAL HIGH (ref 70–99)
Potassium: 4.4 mmol/L (ref 3.5–5.1)
Sodium: 139 mmol/L (ref 135–145)

## 2023-07-17 LAB — POCT ACTIVATED CLOTTING TIME: Activated Clotting Time: 262 s

## 2023-07-17 LAB — PROTIME-INR
INR: 1 (ref 0.8–1.2)
Prothrombin Time: 13.7 s (ref 11.4–15.2)

## 2023-07-17 LAB — NO BLOOD PRODUCTS

## 2023-07-17 SURGERY — ABDOMINAL AORTOGRAM W/LOWER EXTREMITY
Anesthesia: LOCAL

## 2023-07-17 MED ORDER — HEPARIN (PORCINE) IN NACL 1000-0.9 UT/500ML-% IV SOLN
INTRAVENOUS | Status: DC | PRN
  Administered 2023-07-17 (×2): 500 mL

## 2023-07-17 MED ORDER — ASPIRIN 81 MG PO CHEW: 81.0000 mg | CHEWABLE_TABLET | ORAL | Status: DC

## 2023-07-17 MED ORDER — HEPARIN SODIUM (PORCINE) 1000 UNIT/ML IJ SOLN
INTRAMUSCULAR | Status: DC | PRN
  Administered 2023-07-17: 6000 [IU] via INTRAVENOUS

## 2023-07-17 MED ORDER — SODIUM CHLORIDE 0.9 % WEIGHT BASED INFUSION
3.0000 mL/kg/h | INTRAVENOUS | Status: AC
  Administered 2023-07-17: 3 mL/kg/h via INTRAVENOUS

## 2023-07-17 MED ORDER — ACETAMINOPHEN 325 MG PO TABS: 650.0000 mg | ORAL_TABLET | ORAL | Status: DC | PRN

## 2023-07-17 MED ORDER — MIDAZOLAM HCL 2 MG/2ML IJ SOLN
INTRAMUSCULAR | Status: AC
Start: 2023-07-17 — End: ?
  Filled 2023-07-17: qty 2

## 2023-07-17 MED ORDER — LIDOCAINE HCL (PF) 1 % IJ SOLN
INTRAMUSCULAR | Status: AC
  Filled 2023-07-17: qty 30

## 2023-07-17 MED ORDER — SODIUM CHLORIDE 0.9 % IV SOLN: INTRAVENOUS | Status: DC

## 2023-07-17 MED ORDER — CLOPIDOGREL BISULFATE 300 MG PO TABS
ORAL_TABLET | ORAL | Status: DC | PRN
  Administered 2023-07-17: 300 mg via ORAL

## 2023-07-17 MED ORDER — SODIUM CHLORIDE 0.9 % WEIGHT BASED INFUSION: 1.0000 mL/kg/h | INTRAVENOUS | Status: DC

## 2023-07-17 MED ORDER — SODIUM CHLORIDE 0.9% FLUSH: 3.0000 mL | Freq: Two times a day (BID) | INTRAVENOUS | Status: DC

## 2023-07-17 MED ORDER — SODIUM CHLORIDE 0.9% FLUSH: 3.0000 mL | INTRAVENOUS | Status: DC | PRN

## 2023-07-17 MED ORDER — SODIUM CHLORIDE 0.9 % IV SOLN: 250.0000 mL | INTRAVENOUS | Status: DC | PRN

## 2023-07-17 MED ORDER — MIDAZOLAM HCL 2 MG/2ML IJ SOLN
INTRAMUSCULAR | Status: DC | PRN
  Administered 2023-07-17: 1 mg via INTRAVENOUS

## 2023-07-17 MED ORDER — FENTANYL CITRATE (PF) 100 MCG/2ML IJ SOLN
INTRAMUSCULAR | Status: AC
  Filled 2023-07-17: qty 2

## 2023-07-17 MED ORDER — FENTANYL CITRATE (PF) 100 MCG/2ML IJ SOLN
INTRAMUSCULAR | Status: DC | PRN
  Administered 2023-07-17: 25 ug via INTRAVENOUS

## 2023-07-17 MED ORDER — HEPARIN SODIUM (PORCINE) 1000 UNIT/ML IJ SOLN
INTRAMUSCULAR | Status: AC
  Filled 2023-07-17: qty 10

## 2023-07-17 MED ORDER — CLOPIDOGREL BISULFATE 75 MG PO TABS: 75.0000 mg | ORAL_TABLET | Freq: Every day | ORAL | 3 refills | Status: DC

## 2023-07-17 MED ORDER — LIDOCAINE HCL (PF) 1 % IJ SOLN
INTRAMUSCULAR | Status: DC | PRN
  Administered 2023-07-17: 15 mL

## 2023-07-17 MED ORDER — IODIXANOL 320 MG/ML IV SOLN
INTRAVENOUS | Status: DC | PRN
  Administered 2023-07-17: 35 mL

## 2023-07-17 MED ORDER — ONDANSETRON HCL 4 MG/2ML IJ SOLN: 4.0000 mg | Freq: Four times a day (QID) | INTRAMUSCULAR | Status: DC | PRN

## 2023-07-17 SURGICAL SUPPLY — 18 items
CATH OMNI FLUSH 5F 65CM (CATHETERS) IMPLANT
DCB RANGER 5.0X40 135 (BALLOONS) IMPLANT
DEVICE CLOSURE MYNXGRIP 6/7F (Vascular Products) IMPLANT
KIT ENCORE 26 ADVANTAGE (KITS) IMPLANT
KIT MICROPUNCTURE NIT STIFF (SHEATH) IMPLANT
KIT PV (KITS) ×2 IMPLANT
KIT SYRINGE INJ CVI SPIKEX1 (MISCELLANEOUS) IMPLANT
RANGER DCB 5.0X40 135 (BALLOONS) ×2 IMPLANT
SET ATX-X65L (MISCELLANEOUS) IMPLANT
SHEATH CATAPULT 6F 45 MP (SHEATH) IMPLANT
SHEATH PINNACLE 5F 10CM (SHEATH) IMPLANT
SHEATH PINNACLE 6F 10CM (SHEATH) IMPLANT
SHEATH PROBE COVER 6X72 (BAG) IMPLANT
TAPE SHOOT N SEE (TAPE) IMPLANT
TRANSDUCER W/STOPCOCK (MISCELLANEOUS) ×2 IMPLANT
TRAY PV CATH (CUSTOM PROCEDURE TRAY) ×2 IMPLANT
WIRE BENTSON .035X145CM (WIRE) IMPLANT
WIRE SHEPHERD 6G .018 (WIRE) IMPLANT

## 2023-07-17 NOTE — Interval H&P Note (Signed)
 History and Physical Interval Note:  07/17/2023 2:30 PM  William [name]  has presented today for surgery, with the diagnosis of pad.  The various methods of treatment have been discussed with the patient and family. After consideration of risks, benefits and other options for treatment, the patient has consented to  Procedure(s): ABDOMINAL AORTOGRAM W/LOWER EXTREMITY (N/A) as a surgical intervention.  The patient's history has been reviewed, patient examined, no change in status, stable for surgery.  I have reviewed the patient's chart and labs.  Questions were answered to the patient's satisfaction.     Kriss Perleberg

## 2023-07-18 ENCOUNTER — Encounter (HOSPITAL_COMMUNITY): Payer: Self-pay | Admitting: Cardiovascular Disease

## 2023-07-19 ENCOUNTER — Other Ambulatory Visit: Payer: Self-pay | Admitting: Internal Medicine

## 2023-07-19 MED ORDER — BLOOD GLUCOSE MONITORING SUPPL DEVI: 1.0000 | Freq: Three times a day (TID) | 1 refills | Status: AC

## 2023-07-19 MED ORDER — LANCETS MISC. MISC: 1.0000 | Freq: Three times a day (TID) | 0 refills | Status: AC

## 2023-07-19 MED ORDER — LANCET DEVICE MISC: 1.0000 | Freq: Three times a day (TID) | 5 refills | Status: AC

## 2023-07-19 MED ORDER — BLOOD GLUCOSE TEST VI STRP: 1.0000 | ORAL_STRIP | Freq: Three times a day (TID) | 5 refills | Status: DC

## 2023-07-19 NOTE — Telephone Encounter (Signed)
Spoke with patients daughter, Marcelino Duster. She is aware glucose meter has been ordered for patient, very thankful.

## 2023-07-19 NOTE — Telephone Encounter (Signed)
Copied from CRM [id number]. Topic: Clinical - Medication Question >> Jul 19, 2023  8:33 AM Dimitri Ped wrote: Reason for CRM: patient daughter Marcelino Duster is calling back concerning needing a accu-check being sent in for patient . No one has responded or said anything and patient daughter is using moms now to check his sugar Patient's daughter is calling because the dexcome is not working and they have not been able to check patient's glucose readings. They have reached out to [facility] but it will take some time to get a replacement. She was wondering if they can get a prescription for a accu-check aviva glucose meter and test strips. Call back number [phone] Ms. Marcelino Duster and if no answer leave detailed message because daughter is at work. Patient daughter called Monday and nothing still has been done

## 2023-07-22 DIAGNOSIS — Z7901 Long term (current) use of anticoagulants: Secondary | ICD-10-CM | POA: Diagnosis not present

## 2023-07-22 DIAGNOSIS — R3121 Asymptomatic microscopic hematuria: Secondary | ICD-10-CM | POA: Diagnosis not present

## 2023-07-22 NOTE — Progress Notes (Shared)
[facility] - Clinic Note  07/29/2023    CHIEF COMPLAINT Patient presents for No chief complaint on file.  HISTORY OF PRESENT ILLNESS: William [name] is a 73 y.o. male who presents to the clinic today for:    Pt states  Referring physician: [name], MD [address] [place],  [place] [address]  HISTORICAL INFORMATION:  Selected notes from the MEDICAL RECORD NUMBER Referred by Dr. Alben Spittle for eval of DME OU LEE:  Ocular Hx- PMH-    CURRENT MEDICATIONS: Current Outpatient Medications (Ophthalmic Drugs)  Medication Sig   prednisoLONE acetate (PRED FORTE) 1 % ophthalmic suspension Place 1 drop into the right eye 4 (four) times daily for 7 days.   No current facility-administered medications for this visit. (Ophthalmic Drugs)   Current Outpatient Medications (Other)  Medication Sig   acetaminophen (TYLENOL) 500 MG tablet Take 500 mg by mouth every 6 (six) hours as needed for moderate pain.   Blood Glucose Monitoring Suppl DEVI 1 each by Does not apply route in the morning, at noon, and at bedtime. May substitute to any manufacturer covered by patient's insurance.   carvedilol (COREG) 3.125 MG tablet TAKE 1 TABLET(3.125 MG) BY MOUTH TWICE DAILY WITH A MEAL   clopidogrel (PLAVIX) 75 MG tablet Take 1 tablet (75 mg total) by mouth daily.   Continuous Glucose Receiver (DEXCOM G7 RECEIVER) DEVI 1 Act by Does not apply route daily.   Continuous Glucose Sensor (DEXCOM G7 SENSOR) MISC 1 Act by Does not apply route daily.   Continuous Glucose Sensor (FREESTYLE LIBRE 2 SENSOR) MISC APPLY A NEW SENSOR EVERY 14 DAYS (Patient not taking: No sig reported)   dapagliflozin propanediol (FARXIGA) 10 MG TABS tablet TAKE 1 TABLET(10 MG) BY MOUTH DAILY   gabapentin (NEURONTIN) 300 MG capsule TAKE 1 CAPSULE(300 MG) BY MOUTH TWICE DAILY (Patient taking differently: Take 300 mg by mouth 2 (two) times daily.)   Glucose Blood (BLOOD GLUCOSE TEST STRIPS) STRP 1 each by In Vitro  route in the morning, at noon, and at bedtime. May substitute to any manufacturer covered by patient's insurance.   HUMALOG KWIKPEN 100 UNIT/ML KwikPen INJECT 10 UNITS UNDER THE SKIN THREE TIMES DAILY AS DIRECTED BY SLIDING SCALE (Patient taking differently: Inject 4 Units into the skin in the morning, at noon, and at bedtime. Sliding scale)   insulin glargine (LANTUS SOLOSTAR) 100 UNIT/ML Solostar Pen Inject 55 Units into the skin at bedtime.   Insulin Pen Needle 31G X 8 MM MISC 1 each by Does not apply route 4 (four) times daily.   Lancet Device MISC 1 each by Does not apply route in the morning, at noon, and at bedtime. May substitute to any manufacturer covered by patient's insurance.   Lancets Misc. MISC 1 each by Does not apply route in the morning, at noon, and at bedtime. May substitute to any manufacturer covered by patient's insurance.   OZEMPIC, 0.25 OR 0.5 MG/DOSE, 2 MG/3ML SOPN Inject 0.25 mg into the skin once a week.   pantoprazole (PROTONIX) 40 MG tablet TAKE 1 TABLET(40 MG) BY MOUTH TWICE DAILY BEFORE A MEAL   pramipexole (MIRAPEX) 0.125 MG tablet TAKE 1 TABLET(0.125 MG) BY MOUTH AT BEDTIME   rosuvastatin (CRESTOR) 40 MG tablet TAKE 1 TABLET BY MOUTH EVERY DAY   silver sulfADIAZINE (SILVADENE) 1 % cream Apply 1 Application topically daily. (Patient taking differently: Apply 1 Application topically daily as needed (Wound).)   torsemide (DEMADEX) 20 MG tablet Take 1  tablet (20 mg total) by mouth every other day. TAKE 1 TABLET(20 MG) BY MOUTH 3 TIMES A WEEK Monday, Wednesday and Friday (Patient taking differently: Take 20 mg by mouth every Monday, Wednesday, and Friday.)   VASCEPA 1 g capsule TAKE 2 CAPSULES(2 GRAMS) BY MOUTH TWICE DAILY (Patient not taking: No sig reported)   warfarin (COUMADIN) 5 MG tablet TAKE 1 TO 1 AND 1/2 TABLETS BY MOUTH DAILY AS DIRECTED BY COUMADIN CLINIC (Patient taking differently: Take 5-7.5 mg by mouth See admin instructions. Take 5 mg on Mon., Wed., And  Fri., All the other days take 7.5 mg  BY MOUTH DAILY AS DIRECTED BY COUMADIN CLINIC)   No current facility-administered medications for this visit. (Other)   REVIEW OF SYSTEMS:   ALLERGIES Allergies  Allergen Reactions   Morphine And Codeine Shortness Of Breath and Other (See Comments)    UNSPECIFIED REACTION "Pt said it was too much"    Latex Rash   PAST MEDICAL HISTORY Past Medical History:  Diagnosis Date   AKI (acute kidney injury) ([name])    With STEMI in 2017   Chronic systolic CHF (congestive heart failure) ([name])    Depression    Diabetes mellitus without complication ([name])    Diabetic retinopathy ([name])    GERD (gastroesophageal reflux disease)    Hx of adenomatous colonic polyps 04/07/2018   Hyperlipidemia    Hypertension    Hypertensive retinopathy    Paroxysmal atrial fibrillation ([name])    Peripheral vascular disease ([name])    Pneumonia    Seizures ([name])    hx of as a child   STEMI (ST elevation myocardial infarction) ([name]) 2017   Past Surgical History:  Procedure Laterality Date   ABDOMINAL AORTOGRAM W/LOWER EXTREMITY N/A 09/19/2016   Procedure: Abdominal Aortogram w/Lower Extremity;  Surgeon: Iran Ouch, MD;  Location: [facility];  Service: Cardiovascular;  Laterality: N/A;   ABDOMINAL AORTOGRAM W/LOWER EXTREMITY N/A 07/17/2023   Procedure: ABDOMINAL AORTOGRAM W/LOWER EXTREMITY;  Surgeon: Iran Ouch, MD;  Location: [facility];  Service: Cardiovascular;  Laterality: N/A;   AMPUTATION Right 03/23/2016   Procedure: 1st and 2nd Ray Amputation Right Foot;  Surgeon: Nadara Mustard, MD;  Location: [facility] OR;  Service: Orthopedics;  Laterality: Right;   AMPUTATION Right 06/21/2016   Procedure: RIGHT TRANSMETATARSAL AMPUTATION;  Surgeon: Nadara Mustard, MD;  Location: [facility] OR;  Service: Orthopedics;  Laterality: Right;   CARDIAC CATHETERIZATION N/A 03/13/2016   Procedure: Right/Left Heart Cath and Coronary Angiography;  Surgeon: Tonny Bollman, MD;   Location: [facility] INVASIVE CV LAB;  Service: Cardiovascular;  Laterality: N/A;   CARDIAC CATHETERIZATION N/A 03/13/2016   Procedure: IABP Insertion;  Surgeon: Tonny Bollman, MD;  Location: [facility] INVASIVE CV LAB;  Service: Cardiovascular;  Laterality: N/A;   CATARACT EXTRACTION     CATARACT EXTRACTION, BILATERAL     CERVICAL FUSION  1982, 1992   has had 3 neck surgeries from breaking his neck   CIRCUMCISION N/A 11/07/2021   Procedure: CIRCUMCISION ADULT;  Surgeon: Despina Arias, MD;  Location: [facility] ORS;  Service: Urology;  Laterality: N/A;   CORONARY ARTERY BYPASS GRAFT N/A 03/13/2016   Procedure: CORONARY ARTERY BYPASS GRAFTING (CABG) x 1 (SVG to OM) with EVH from LEFT GREATER SAPHENOUS VEIN;  Surgeon: Kerin Perna, MD;  Location: [facility] OR;  Service: Open Heart Surgery;  Laterality: N/A;   EYE SURGERY     LOWER EXTREMITY ANGIOGRAM  05/02/2016   Procedure: Lower Extremity Angiogram;  Surgeon: Iran Ouch, MD;  Location: [facility] INVASIVE CV LAB;  Service: Cardiovascular;;  Limited left femoral runoff right femoral runoff   PERIPHERAL VASCULAR BALLOON ANGIOPLASTY  07/17/2023   Procedure: PERIPHERAL VASCULAR BALLOON ANGIOPLASTY;  Surgeon: Iran Ouch, MD;  Location: [facility];  Service: Cardiovascular;;   PERIPHERAL VASCULAR CATHETERIZATION N/A 05/02/2016   Procedure: Abdominal Aortogram;  Surgeon: Iran Ouch, MD;  Location: [facility];  Service: Cardiovascular;  Laterality: N/A;   PERIPHERAL VASCULAR CATHETERIZATION Right 05/02/2016   Procedure: Peripheral Vascular Balloon Angioplasty;  Surgeon: Iran Ouch, MD;  Location: [facility];  Service: Cardiovascular;  Laterality: Right;  SFA   TEE WITHOUT CARDIOVERSION N/A 03/13/2016   Procedure: TRANSESOPHAGEAL ECHOCARDIOGRAM (TEE);  Surgeon: Kerin Perna, MD;  Location: [facility] OR;  Service: Open Heart Surgery;  Laterality: N/A;   VSD REPAIR N/A 03/13/2016   Procedure: VENTRICULAR SEPTAL DEFECT (VSD) REPAIR;  Surgeon:  Kerin Perna, MD;  Location: [facility] OR;  Service: Open Heart Surgery;  Laterality: N/A;   FAMILY HISTORY Family History  Problem Relation Age of Onset   Diabetes Maternal Grandmother    Diabetes Mother    Aneurysm Mother    Peripheral Artery Disease Mother    Coronary artery disease Mother    Peptic Ulcer Father    Retinoblastoma Daughter    Colon cancer Neg Hx    Rectal cancer Neg Hx    SOCIAL HISTORY Social History   Tobacco Use   Smoking status: Former    Current packs/day: 0.00    Types: Cigarettes    Quit date: 11/20/2015    Years since quitting: 7.6   Smokeless tobacco: Never   Tobacco comments:    quit 2018  Vaping Use   Vaping status: Never Used  Substance Use Topics   Alcohol use: No   Drug use: No       OPHTHALMIC EXAM: Not recorded    IMAGING AND PROCEDURES  Imaging and Procedures for 07/29/2023          ASSESSMENT/PLAN:    ICD-10-CM   1. Moderate nonproliferative diabetic retinopathy of both eyes with macular edema associated with type 2 diabetes mellitus ([name])  Z61.0960     2. Current use of insulin ([name])  Z79.4     3. Long-term (current) use of injectable non-insulin antidiabetic drugs  Z79.85     4. Essential hypertension  I10     5. Hypertensive retinopathy of both eyes  H35.033     6. Pseudophakia, both eyes  Z96.1      1-3. Moderate non-proliferative diabetic retinopathy, both eyes  - last A1c was 8.0 on 11.06.23  - s/p IVA OD #1 (11.14.22), #2 (12.12.22), #3 (01.09.23) -- IVA resistance - s/p IVA OS #1 (10.17.22), #2 (11.14.22), #3 (12.12.22), #4 (01.09.23), #5 (03.15.23), #6 (04.12.23) -- IVA resistance - s/p IVE OD #1 (03.15.23), #2 (04.12.23), #3 (05.10.23), #4 (06.07.23), #5 (07.05.23), #6 (08.02.23), #7 (08.31.23), #8 (09.28.23), #9 (10.30.23), #10 (11.28.23), #11 (12.28.23), #12 (01.29.24), #13 (02.20.24), #14 (03.25.24), #15 (04.22.24), #16 (05.20.24), #17 (06.17.24), #18 (07.22.24), #19 (08.26.24), #20 (09.30.24), #21  (11.04.24), #22 (12.16.24) - s/p IVE OS #1 (05.10.23), #2 (06.07.23), #3 (07.05.23), #4 (08.02.23), #5 (08.31.23), #6 (09.28.23), #7 (10.30.23), #8 (11.28.23), #9 (12.28.23), #10 (01.29.24), #11 (02.20..24), #12 (03.25.24), #13 (04.22.24), #14 (05.20.24), #15 (06.17.24), #16 (07.22.24), #17 (09.30.24), #18 (11.04.24), #19 (12.16.24) - s/p focal laser OS (06.25.24) - s/p focal laser OD (01.13.25) - exam shows scattered MA,  DBH OU - FA (10.17.22) shows late leaking MA OU, no NV OU - OCT shows OD: Mild interval increase in IRF/cystic changes superior fovea and mac; OS: interval improvement in IRF/ cystic changes temporal macula at 4 wks - BCVA 20/25 OU - recommend IVE OD #23 and IVE OS #20 today, 01.27.25 - pt wishes to proceed with injections - RBA of procedure discussed, questions answered - informed consent obtained and signed - see procedure note - IVE informed consent obtained and signed, 11.04.24 (OU) - will check [employer] auth for 2025 - follow 4 weeks, DFE/OCT, injections OU  4,5. Hypertensive retinopathy OU - discussed importance of tight BP control and possibility of elevated BP blunting efficacy of IVE  - monitor  6. Pseudophakia OU  - s/p CE/IOL OU  - IOL in good position, doing well  - monitor  Ophthalmic Meds Ordered this visit:  No orders of the defined types were placed in this encounter.    No follow-ups on file.  There are no Patient Instructions on file for this visit.  Explained the diagnoses, plan, and follow up with the patient and they expressed understanding.  Patient expressed understanding of the importance of proper follow up care.   This document serves as a record of services personally performed by Karie Chimera, MD, PhD. It was created on their behalf by Glee Arvin. Manson Passey, OA an ophthalmic technician. The creation of this record is the provider's dictation and/or activities during the visit.    Electronically signed by: Glee Arvin. Manson Passey, OA 07/22/23  12:21 PM   Karie Chimera, M.D., Ph.D. Diseases & Surgery of the Retina and Vitreous [facility]    Abbreviations: M myopia (nearsighted); A astigmatism; H hyperopia (farsighted); P presbyopia; Mrx spectacle prescription;  CTL contact lenses; OD right eye; OS left eye; OU both eyes  XT exotropia; ET esotropia; PEK punctate epithelial keratitis; PEE punctate epithelial erosions; DES dry eye syndrome; MGD meibomian gland dysfunction; ATs artificial tears; PFAT's preservative free artificial tears; NSC nuclear sclerotic cataract; PSC posterior subcapsular cataract; ERM epi-retinal membrane; PVD posterior vitreous detachment; RD retinal detachment; DM diabetes mellitus; DR diabetic retinopathy; NPDR non-proliferative diabetic retinopathy; PDR proliferative diabetic retinopathy; CSME clinically significant macular edema; DME diabetic macular edema; dbh dot blot hemorrhages; CWS cotton wool spot; POAG primary open angle glaucoma; C/D cup-to-disc ratio; HVF humphrey visual field; GVF goldmann visual field; OCT optical coherence tomography; IOP intraocular pressure; BRVO Branch retinal vein occlusion; CRVO central retinal vein occlusion; CRAO central retinal artery occlusion; BRAO branch retinal artery occlusion; RT retinal tear; SB scleral buckle; PPV pars plana vitrectomy; VH Vitreous hemorrhage; PRP panretinal laser photocoagulation; IVK intravitreal kenalog; VMT vitreomacular traction; MH Macular hole;  NVD neovascularization of the disc; NVE neovascularization elsewhere; AREDS age related eye disease study; ARMD age related macular degeneration; POAG primary open angle glaucoma; EBMD epithelial/anterior basement membrane dystrophy; ACIOL anterior chamber intraocular lens; IOL intraocular lens; PCIOL posterior chamber intraocular lens; Phaco/IOL phacoemulsification with intraocular lens placement; PRK photorefractive keratectomy; LASIK laser assisted in situ keratomileusis; HTN  hypertension; DM diabetes mellitus; COPD chronic obstructive pulmonary disease

## 2023-07-29 ENCOUNTER — Encounter (INDEPENDENT_AMBULATORY_CARE_PROVIDER_SITE_OTHER): Payer: 59 | Admitting: Ophthalmology

## 2023-07-29 ENCOUNTER — Ambulatory Visit: Payer: 59 | Admitting: Internal Medicine

## 2023-07-29 ENCOUNTER — Ambulatory Visit: Payer: 59 | Attending: Cardiology

## 2023-07-29 DIAGNOSIS — H35033 Hypertensive retinopathy, bilateral: Secondary | ICD-10-CM

## 2023-07-29 DIAGNOSIS — I480 Paroxysmal atrial fibrillation: Secondary | ICD-10-CM | POA: Diagnosis not present

## 2023-07-29 DIAGNOSIS — I10 Essential (primary) hypertension: Secondary | ICD-10-CM

## 2023-07-29 DIAGNOSIS — Z794 Long term (current) use of insulin: Secondary | ICD-10-CM

## 2023-07-29 DIAGNOSIS — E113313 Type 2 diabetes mellitus with moderate nonproliferative diabetic retinopathy with macular edema, bilateral: Secondary | ICD-10-CM

## 2023-07-29 DIAGNOSIS — Z7985 Long-term (current) use of injectable non-insulin antidiabetic drugs: Secondary | ICD-10-CM

## 2023-07-29 DIAGNOSIS — Z961 Presence of intraocular lens: Secondary | ICD-10-CM

## 2023-07-29 DIAGNOSIS — Z5181 Encounter for therapeutic drug level monitoring: Secondary | ICD-10-CM | POA: Diagnosis not present

## 2023-07-29 LAB — POCT INR: INR: 1.6 — AB (ref 2.0–3.0)

## 2023-07-29 NOTE — Patient Instructions (Signed)
Take 1.5 tablets today then continue taking 1.5 tablets daily except for 1 tablet every Mondays, Wednesdays and Fridays. Recheck INR in 3 weeks.  [facility] with [facility] [phone]

## 2023-07-31 NOTE — Progress Notes (Signed)
[facility] - Clinic Note  08/05/2023    CHIEF COMPLAINT Patient presents for Retina Follow Up  HISTORY OF PRESENT ILLNESS: William [name] is a 73 y.o. male who presents to the clinic today for:   HPI     Retina Follow Up   Patient presents with  Diabetic Retinopathy.  In both eyes.  This started 3 weeks ago.  Duration of 3 weeks.  I, the attending physician,  performed the HPI with the patient and updated documentation appropriately.        Comments   Patient feels the vision is the same, he just can't see well up close. He is using AT's PRN. His blood sugar was 98.      Last edited by Rennis Chris, MD on 08/05/2023 12:18 PM.     Referring physician: [name], MD [address] [place],  [place] [address]  HISTORICAL INFORMATION:  Selected notes from the MEDICAL RECORD NUMBER Referred by Dr. Alben Spittle for eval of DME OU LEE:  Ocular Hx- PMH-    CURRENT MEDICATIONS: No current outpatient medications on file. (Ophthalmic Drugs)   No current facility-administered medications for this visit. (Ophthalmic Drugs)   Current Outpatient Medications (Other)  Medication Sig   acetaminophen (TYLENOL) 500 MG tablet Take 500 mg by mouth every 6 (six) hours as needed for moderate pain.   Blood Glucose Monitoring Suppl DEVI 1 each by Does not apply route in the morning, at noon, and at bedtime. May substitute to any manufacturer covered by patient's insurance.   carvedilol (COREG) 3.125 MG tablet TAKE 1 TABLET(3.125 MG) BY MOUTH TWICE DAILY WITH A MEAL   clopidogrel (PLAVIX) 75 MG tablet Take 1 tablet (75 mg total) by mouth daily.   Continuous Glucose Receiver (DEXCOM G7 RECEIVER) DEVI 1 Act by Does not apply route daily.   Continuous Glucose Sensor (DEXCOM G7 SENSOR) MISC 1 Act by Does not apply route daily.   Continuous Glucose Sensor (FREESTYLE LIBRE 2 SENSOR) MISC APPLY A NEW SENSOR EVERY 14 DAYS (Patient not taking: No sig reported)   dapagliflozin  propanediol (FARXIGA) 10 MG TABS tablet TAKE 1 TABLET(10 MG) BY MOUTH DAILY   gabapentin (NEURONTIN) 300 MG capsule TAKE 1 CAPSULE(300 MG) BY MOUTH TWICE DAILY (Patient taking differently: Take 300 mg by mouth 2 (two) times daily.)   Glucose Blood (BLOOD GLUCOSE TEST STRIPS) STRP 1 each by In Vitro route in the morning, at noon, and at bedtime. May substitute to any manufacturer covered by patient's insurance.   HUMALOG KWIKPEN 100 UNIT/ML KwikPen INJECT 10 UNITS UNDER THE SKIN THREE TIMES DAILY AS DIRECTED BY SLIDING SCALE (Patient taking differently: Inject 4 Units into the skin in the morning, at noon, and at bedtime. Sliding scale)   insulin glargine (LANTUS SOLOSTAR) 100 UNIT/ML Solostar Pen Inject 55 Units into the skin at bedtime.   Insulin Pen Needle 31G X 8 MM MISC 1 each by Does not apply route 4 (four) times daily.   Lancet Device MISC 1 each by Does not apply route in the morning, at noon, and at bedtime. May substitute to any manufacturer covered by patient's insurance.   Lancets Misc. MISC 1 each by Does not apply route in the morning, at noon, and at bedtime. May substitute to any manufacturer covered by patient's insurance.   OZEMPIC, 0.25 OR 0.5 MG/DOSE, 2 MG/3ML SOPN Inject 0.25 mg into the skin once a week.   pantoprazole (PROTONIX) 40 MG tablet TAKE 1 TABLET(40  MG) BY MOUTH TWICE DAILY BEFORE A MEAL   pramipexole (MIRAPEX) 0.125 MG tablet TAKE 1 TABLET(0.125 MG) BY MOUTH AT BEDTIME   rosuvastatin (CRESTOR) 40 MG tablet TAKE 1 TABLET BY MOUTH EVERY DAY   silver sulfADIAZINE (SILVADENE) 1 % cream Apply 1 Application topically daily. (Patient taking differently: Apply 1 Application topically daily as needed (Wound).)   torsemide (DEMADEX) 20 MG tablet Take 1 tablet (20 mg total) by mouth every other day. TAKE 1 TABLET(20 MG) BY MOUTH 3 TIMES A WEEK Monday, Wednesday and Friday (Patient taking differently: Take 20 mg by mouth every Monday, Wednesday, and Friday.)   VASCEPA 1 g capsule  TAKE 2 CAPSULES(2 GRAMS) BY MOUTH TWICE DAILY (Patient not taking: No sig reported)   warfarin (COUMADIN) 5 MG tablet TAKE 1 TO 1 AND 1/2 TABLETS BY MOUTH DAILY AS DIRECTED BY COUMADIN CLINIC (Patient taking differently: Take 5-7.5 mg by mouth See admin instructions. Take 5 mg on Mon., Wed., And Fri., All the other days take 7.5 mg  BY MOUTH DAILY AS DIRECTED BY COUMADIN CLINIC)   No current facility-administered medications for this visit. (Other)   REVIEW OF SYSTEMS: ROS   Positive for: Gastrointestinal, Genitourinary, Endocrine, Eyes Negative for: Constitutional, Neurological, Skin, Musculoskeletal, HENT, Cardiovascular, Respiratory, Psychiatric, Allergic/Imm, Heme/Lymph Last edited by Charlette Caffey, COT on 08/05/2023  7:50 AM.      ALLERGIES Allergies  Allergen Reactions   Morphine And Codeine Shortness Of Breath and Other (See Comments)    UNSPECIFIED REACTION "Pt said it was too much"    Latex Rash   PAST MEDICAL HISTORY Past Medical History:  Diagnosis Date   AKI (acute kidney injury) ([name])    With STEMI in 2017   Chronic systolic CHF (congestive heart failure) ([name])    Depression    Diabetes mellitus without complication ([name])    Diabetic retinopathy ([name])    GERD (gastroesophageal reflux disease)    Hx of adenomatous colonic polyps 04/07/2018   Hyperlipidemia    Hypertension    Hypertensive retinopathy    Paroxysmal atrial fibrillation ([name])    Peripheral vascular disease ([name])    Pneumonia    Seizures ([name])    hx of as a child   STEMI (ST elevation myocardial infarction) ([name]) 2017   Past Surgical History:  Procedure Laterality Date   ABDOMINAL AORTOGRAM W/LOWER EXTREMITY N/A 09/19/2016   Procedure: Abdominal Aortogram w/Lower Extremity;  Surgeon: Iran Ouch, MD;  Location: [facility];  Service: Cardiovascular;  Laterality: N/A;   ABDOMINAL AORTOGRAM W/LOWER EXTREMITY N/A 07/17/2023   Procedure: ABDOMINAL AORTOGRAM W/LOWER EXTREMITY;   Surgeon: Iran Ouch, MD;  Location: [facility];  Service: Cardiovascular;  Laterality: N/A;   AMPUTATION Right 03/23/2016   Procedure: 1st and 2nd Ray Amputation Right Foot;  Surgeon: Nadara Mustard, MD;  Location: [facility] OR;  Service: Orthopedics;  Laterality: Right;   AMPUTATION Right 06/21/2016   Procedure: RIGHT TRANSMETATARSAL AMPUTATION;  Surgeon: Nadara Mustard, MD;  Location: [facility] OR;  Service: Orthopedics;  Laterality: Right;   CARDIAC CATHETERIZATION N/A 03/13/2016   Procedure: Right/Left Heart Cath and Coronary Angiography;  Surgeon: Tonny Bollman, MD;  Location: [facility] CV LAB;  Service: Cardiovascular;  Laterality: N/A;   CARDIAC CATHETERIZATION N/A 03/13/2016   Procedure: IABP Insertion;  Surgeon: Tonny Bollman, MD;  Location: [facility] INVAS[facility] CV LAB;  Service: Cardiovascular;  Laterality: N/A;   CATARACT EXTRACTION     CATARACT EXTRACTION, BILATERAL     CERVICAL  FUSION  1982, 1992   has had 3 neck surgeries from breaking his neck   CIRCUMCISION N/A 11/07/2021   Procedure: CIRCUMCISION ADULT;  Surgeon: Despina Arias, MD;  Location: [facility] ORS;  Service: Urology;  Laterality: N/A;   CORONARY ARTERY BYPASS GRAFT N/A 03/13/2016   Procedure: CORONARY ARTERY BYPASS GRAFTING (CABG) x 1 (SVG to OM) with EVH from LEFT GREATER SAPHENOUS VEIN;  Surgeon: Kerin Perna, MD;  Location: [facility] OR;  Service: Open Heart Surgery;  Laterality: N/A;   EYE SURGERY     LOWER EXTREMITY ANGIOGRAM  05/02/2016   Procedure: Lower Extremity Angiogram;  Surgeon: Iran Ouch, MD;  Location: [facility];  Service: Cardiovascular;;  Limited left femoral runoff right femoral runoff   PERIPHERAL VASCULAR BALLOON ANGIOPLASTY  07/17/2023   Procedure: PERIPHERAL VASCULAR BALLOON ANGIOPLASTY;  Surgeon: Iran Ouch, MD;  Location: [facility];  Service: Cardiovascular;;   PERIPHERAL VASCULAR CATHETERIZATION N/A 05/02/2016   Procedure: Abdominal Aortogram;  Surgeon: Iran Ouch, MD;   Location: [facility];  Service: Cardiovascular;  Laterality: N/A;   PERIPHERAL VASCULAR CATHETERIZATION Right 05/02/2016   Procedure: Peripheral Vascular Balloon Angioplasty;  Surgeon: Iran Ouch, MD;  Location: [facility];  Service: Cardiovascular;  Laterality: Right;  SFA   TEE WITHOUT CARDIOVERSION N/A 03/13/2016   Procedure: TRANSESOPHAGEAL ECHOCARDIOGRAM (TEE);  Surgeon: Kerin Perna, MD;  Location: [facility] OR;  Service: Open Heart Surgery;  Laterality: N/A;   VSD REPAIR N/A 03/13/2016   Procedure: VENTRICULAR SEPTAL DEFECT (VSD) REPAIR;  Surgeon: Kerin Perna, MD;  Location: [facility] OR;  Service: Open Heart Surgery;  Laterality: N/A;   FAMILY HISTORY Family History  Problem Relation Age of Onset   Diabetes Maternal Grandmother    Diabetes Mother    Aneurysm Mother    Peripheral Artery Disease Mother    Coronary artery disease Mother    Peptic Ulcer Father    Retinoblastoma Daughter    Colon cancer Neg Hx    Rectal cancer Neg Hx    SOCIAL HISTORY Social History   Tobacco Use   Smoking status: Former    Current packs/day: 0.00    Types: Cigarettes    Quit date: 11/20/2015    Years since quitting: 7.7   Smokeless tobacco: Never   Tobacco comments:    quit 2018  Vaping Use   Vaping status: Never Used  Substance Use Topics   Alcohol use: No   Drug use: No       OPHTHALMIC EXAM: Base Eye Exam     Visual Acuity (Snellen - Linear)       Right Left   Dist [place] 20/25 20/25   Dist ph [place] NI NI         Tonometry (Tonopen, 7:52 AM)       Right Left   Pressure 12 12         Pupils       Dark Light Shape React APD   Right 2 1 Round Brisk None   Left 2 1 Round Brisk None         Visual Fields       Left Right    Full Full         Extraocular Movement       Right Left    Full, Ortho Full, Ortho         Neuro/Psych     Oriented x3: Yes   Mood/Affect: Normal  Dilation     Both eyes: 1.0% Mydriacyl, 2.5%  Phenylephrine @ 7:50 AM           Slit Lamp and Fundus Exam     Slit Lamp Exam       Right Left   Lids/Lashes Dermatochalasis - upper lid, mild MGD Dermatochalasis - upper lid, mild MGD   Conjunctiva/Sclera White and quiet White and quiet   Cornea trace PEE, well healed cataract wound, mild tear film debris 1+ fine PEE, mild tear film debris, well healed cataract wound   Anterior Chamber Deep and quiet Deep and quiet   Iris Round and dilated, No NVI Round and dilated, No NVI   Lens PC IOL in good position PC IOL in good position   Anterior Vitreous Vitreous syneresis, vitreous condensations Vitreous syneresis, Posterior vitreous detachment, vitreous condensations         Fundus Exam       Right Left   Disc trace Pallor, Sharp rim mild Pallor, Sharp rim, mild tilt   C/D Ratio 0.3 0.3   Macula Flat, good foveal reflex, scatted Microaneurysms cystic changes superior macula -- improved, good focal laser changes Flat, good foveal reflex, scattered MA -- improved, interval improvement in cystic changes temporal mac, good focal laser changes   Vessels attenuated, mild tortuosity attenuated, Tortuous   Periphery Attached, +MA greatest posteriorly Attached, scattered MA greatest posteriorly           IMAGING AND PROCEDURES  Imaging and Procedures for 08/05/2023  OCT, Retina - OU - Both Eyes       Right Eye Quality was good. Central Foveal Thickness: 254. Progression has improved. Findings include no SRF, abnormal foveal contour, intraretinal hyper-reflective material, intraretinal fluid, vitreomacular adhesion (Interval improvement in IRF/cystic changes superior fovea and mac ).   Left Eye Quality was good. Central Foveal Thickness: 246. Progression has been stable. Findings include normal foveal contour, no SRF, intraretinal hyper-reflective material, intraretinal fluid (persistent IRF/ cystic changes temporal macula -- slightly improved).   Notes *Images captured and stored  on drive  Diagnosis / Impression:  +DME OU OD: Interval improvement in IRF/cystic changes superior fovea and mac   OS: persistent IRF/ cystic changes temporal macula -- slightly improved  Clinical management:  See below  Abbreviations: NFP - Normal foveal profile. CME - cystoid macular edema. PED - pigment epithelial detachment. IRF - intraretinal fluid. SRF - subretinal fluid. EZ - ellipsoid zone. ERM - epiretinal membrane. ORA - outer retinal atrophy. ORT - outer retinal tubulation. SRHM - subretinal hyper-reflective material. IRHM - intraretinal hyper-reflective material            ASSESSMENT/PLAN:    ICD-10-CM   1. Moderate nonproliferative diabetic retinopathy of both eyes with macular edema associated with type 2 diabetes mellitus ([name])  E11.3313 OCT, Retina - OU - Both Eyes    2. Current use of insulin ([name])  Z79.4     3. Long-term (current) use of injectable non-insulin antidiabetic drugs  Z79.85     4. Essential hypertension  I10     5. Hypertensive retinopathy of both eyes  H35.033     6. Pseudophakia, both eyes  Z96.1      1-3. Moderate non-proliferative diabetic retinopathy, both eyes  - last A1c was 8.0 on 11.06.23  - s/p IVA OD #1 (11.14.22), #2 (12.12.22), #3 (01.09.23) -- IVA resistance - s/p IVA OS #1 (10.17.22), #2 (11.14.22), #3 (12.12.22), #4 (01.09.23), #5 (03.15.23), #6 (04.12.23) -- IVA resistance - s/p IVE  OD #1 (03.15.23), #2 (04.12.23), #3 (05.10.23), #4 (06.07.23), #5 (07.05.23), #6 (08.02.23), #7 (08.31.23), #8 (09.28.23), #9 (10.30.23), #10 (11.28.23), #11 (12.28.23), #12 (01.29.24), #13 (02.20.24), #14 (03.25.24), #15 (04.22.24), #16 (05.20.24), #17 (06.17.24), #18 (07.22.24), #19 (08.26.24), #20 (09.30.24), #21 (11.04.24), #22 (12.16.24) - s/p IVE OS #1 (05.10.23), #2 (06.07.23), #3 (07.05.23), #4 (08.02.23), #5 (08.31.23), #6 (09.28.23), #7 (10.30.23), #8 (11.28.23), #9 (12.28.23), #10 (01.29.24), #11 (02.20..24), #12 (03.25.24), #13 (04.22.24),  #14 (05.20.24), #15 (06.17.24), #16 (07.22.24), #17 (09.30.24), #18 (11.04.24), #19 (12.16.24) - s/p focal laser OS (06.25.24) - s/p focal laser OD (01.13.25) - exam shows scattered MA, DBH OU - FA (10.17.22) shows late leaking MA OU, no NV OU - OCT shows OD: Interval improvement in IRF/cystic changes superior fovea and mac; OS: peristent IRF/ cystic changes temporal macula -- slightly improved at 7 wks since last injections - BCVA 20/25 OU -- stable - recommend holding injections today - pt in agreement - IVE informed consent obtained and signed, 11.04.24 (OU) - f/u 6 weeks, sooner prn -- DFE, OCT, possible injection(s)  4,5. Hypertensive retinopathy OU - discussed importance of tight BP control - monitor  6. Pseudophakia OU  - s/p CE/IOL OU  - IOL in good position, doing well  - monitor  Ophthalmic Meds Ordered this visit:  No orders of the defined types were placed in this encounter.    Return in about 6 weeks (around 09/16/2023) for f/u NPDR OU, DFE, OCT.  There are no Patient Instructions on file for this visit.  Explained the diagnoses, plan, and follow up with the patient and they expressed understanding.  Patient expressed understanding of the importance of proper follow up care.   This document serves as a record of services personally performed by Karie Chimera, MD, PhD. It was created on their behalf by Glee Arvin. Manson Passey, OA an ophthalmic technician. The creation of this record is the provider's dictation and/or activities during the visit.    Electronically signed by: Glee Arvin. Manson Passey, OA 08/05/23 12:19 PM  Karie Chimera, M.D., Ph.D. Diseases & Surgery of the Retina and Vitreous [facility]  I have reviewed the above documentation for accuracy and completeness, and I agree with the above. Karie Chimera, M.D., Ph.D. 08/05/23 12:21 PM   Abbreviations: M myopia (nearsighted); A astigmatism; H hyperopia (farsighted); P presbyopia; Mrx  spectacle prescription;  CTL contact lenses; OD right eye; OS left eye; OU both eyes  XT exotropia; ET esotropia; PEK punctate epithelial keratitis; PEE punctate epithelial erosions; DES dry eye syndrome; MGD meibomian gland dysfunction; ATs artificial tears; PFAT's preservative free artificial tears; NSC nuclear sclerotic cataract; PSC posterior subcapsular cataract; ERM epi-retinal membrane; PVD posterior vitreous detachment; RD retinal detachment; DM diabetes mellitus; DR diabetic retinopathy; NPDR non-proliferative diabetic retinopathy; PDR proliferative diabetic retinopathy; CSME clinically significant macular edema; DME diabetic macular edema; dbh dot blot hemorrhages; CWS cotton wool spot; POAG primary open angle glaucoma; C/D cup-to-disc ratio; HVF humphrey visual field; GVF goldmann visual field; OCT optical coherence tomography; IOP intraocular pressure; BRVO Branch retinal vein occlusion; CRVO central retinal vein occlusion; CRAO central retinal artery occlusion; BRAO branch retinal artery occlusion; RT retinal tear; SB scleral buckle; PPV pars plana vitrectomy; VH Vitreous hemorrhage; PRP panretinal laser photocoagulation; IVK intravitreal kenalog; VMT vitreomacular traction; MH Macular hole;  NVD neovascularization of the disc; NVE neovascularization elsewhere; AREDS age related eye disease study; ARMD age related macular degeneration; POAG primary open angle glaucoma; EBMD epithelial/anterior basement membrane dystrophy; ACIOL  anterior chamber intraocular lens; IOL intraocular lens; PCIOL posterior chamber intraocular lens; Phaco/IOL phacoemulsification with intraocular lens placement; PRK photorefractive keratectomy; LASIK laser assisted in situ keratomileusis; HTN hypertension; DM diabetes mellitus; COPD chronic obstructive pulmonary disease

## 2023-08-01 ENCOUNTER — Encounter (HOSPITAL_COMMUNITY): Payer: 59

## 2023-08-05 ENCOUNTER — Ambulatory Visit (INDEPENDENT_AMBULATORY_CARE_PROVIDER_SITE_OTHER): Payer: 59 | Admitting: Ophthalmology

## 2023-08-05 ENCOUNTER — Encounter (INDEPENDENT_AMBULATORY_CARE_PROVIDER_SITE_OTHER): Payer: Self-pay | Admitting: Ophthalmology

## 2023-08-05 DIAGNOSIS — Z7985 Long-term (current) use of injectable non-insulin antidiabetic drugs: Secondary | ICD-10-CM | POA: Diagnosis not present

## 2023-08-05 DIAGNOSIS — E113313 Type 2 diabetes mellitus with moderate nonproliferative diabetic retinopathy with macular edema, bilateral: Secondary | ICD-10-CM

## 2023-08-05 DIAGNOSIS — Z961 Presence of intraocular lens: Secondary | ICD-10-CM

## 2023-08-05 DIAGNOSIS — I10 Essential (primary) hypertension: Secondary | ICD-10-CM

## 2023-08-05 DIAGNOSIS — H35033 Hypertensive retinopathy, bilateral: Secondary | ICD-10-CM | POA: Diagnosis not present

## 2023-08-05 DIAGNOSIS — Z794 Long term (current) use of insulin: Secondary | ICD-10-CM

## 2023-08-06 ENCOUNTER — Ambulatory Visit (HOSPITAL_COMMUNITY)
Admission: RE | Admit: 2023-08-06 | Discharge: 2023-08-06 | Disposition: A | Discharge status: Home / Self Care | Payer: 59 | Source: Ambulatory Visit | Attending: Cardiovascular Disease | Admitting: Cardiovascular Disease

## 2023-08-06 DIAGNOSIS — I739 Peripheral vascular disease, unspecified: Secondary | ICD-10-CM | POA: Insufficient documentation

## 2023-08-07 LAB — VAS US ABI WITH/WO TBI
Left ABI: 0.97
Right ABI: 1.01

## 2023-08-08 ENCOUNTER — Telehealth: Payer: Self-pay | Admitting: *Deleted

## 2023-08-08 NOTE — Telephone Encounter (Signed)
-----   Message from [place] sent at 08/08/2023 10:28 AM EST ----- Improved ABI to normal of the left with patent left SFA.  Repeat studies in 6 months.

## 2023-08-08 NOTE — Telephone Encounter (Signed)
 The patient's daughter has been made aware of the results.  She stated that the patient is still having pain in that foot and intermittent bleeding when the scab opens. She would like to know what the next step should be. She stated that another provider had told her that it could possibly be cancerous and that he may need to see a dermatologist.

## 2023-08-08 NOTE — Addendum Note (Signed)
 Addended by: Otho Blitz on: 08/08/2023 04:57 PM   Modules accepted: Orders

## 2023-08-09 ENCOUNTER — Other Ambulatory Visit: Payer: Self-pay | Admitting: Internal Medicine

## 2023-08-09 DIAGNOSIS — Z794 Long term (current) use of insulin: Secondary | ICD-10-CM

## 2023-08-09 NOTE — Telephone Encounter (Signed)
 He should follow-up with his podiatrist.  Seeing a dermatologist is also a good idea.

## 2023-08-09 NOTE — Telephone Encounter (Signed)
 The patient's daughter has been made aware. He has an appointment on the 17th with podiatry.

## 2023-08-13 ENCOUNTER — Ambulatory Visit: Payer: 59 | Admitting: Cardiovascular Disease

## 2023-08-14 ENCOUNTER — Other Ambulatory Visit: Payer: Self-pay | Admitting: Internal Medicine

## 2023-08-14 DIAGNOSIS — N1832 Chronic kidney disease, stage 3b: Secondary | ICD-10-CM

## 2023-08-14 DIAGNOSIS — I5022 Chronic systolic (congestive) heart failure: Secondary | ICD-10-CM

## 2023-08-14 DIAGNOSIS — Z794 Long term (current) use of insulin: Secondary | ICD-10-CM

## 2023-08-19 ENCOUNTER — Ambulatory Visit: Payer: 59 | Admitting: Internal Medicine

## 2023-08-19 ENCOUNTER — Encounter: Payer: Self-pay | Admitting: Internal Medicine

## 2023-08-19 VITALS — BP 92/58 | HR 71 | Temp 97.8°F | Resp 16 | Ht 65.0 in | Wt 185.6 lb

## 2023-08-19 DIAGNOSIS — I950 Idiopathic hypotension: Secondary | ICD-10-CM | POA: Insufficient documentation

## 2023-08-19 DIAGNOSIS — I2510 Atherosclerotic heart disease of native coronary artery without angina pectoris: Secondary | ICD-10-CM

## 2023-08-19 DIAGNOSIS — E785 Hyperlipidemia, unspecified: Secondary | ICD-10-CM | POA: Diagnosis not present

## 2023-08-19 DIAGNOSIS — N1832 Chronic kidney disease, stage 3b: Secondary | ICD-10-CM

## 2023-08-19 DIAGNOSIS — I5022 Chronic systolic (congestive) heart failure: Secondary | ICD-10-CM | POA: Diagnosis not present

## 2023-08-19 DIAGNOSIS — R9431 Abnormal electrocardiogram [ECG] [EKG]: Secondary | ICD-10-CM | POA: Diagnosis not present

## 2023-08-19 DIAGNOSIS — Z794 Long term (current) use of insulin: Secondary | ICD-10-CM | POA: Diagnosis not present

## 2023-08-19 DIAGNOSIS — R3121 Asymptomatic microscopic hematuria: Secondary | ICD-10-CM

## 2023-08-19 DIAGNOSIS — E1152 Type 2 diabetes mellitus with diabetic peripheral angiopathy with gangrene: Secondary | ICD-10-CM

## 2023-08-19 DIAGNOSIS — Z0000 Encounter for general adult medical examination without abnormal findings: Secondary | ICD-10-CM

## 2023-08-19 DIAGNOSIS — I480 Paroxysmal atrial fibrillation: Secondary | ICD-10-CM | POA: Diagnosis not present

## 2023-08-19 DIAGNOSIS — Z0001 Encounter for general adult medical examination with abnormal findings: Secondary | ICD-10-CM

## 2023-08-19 DIAGNOSIS — L989 Disorder of the skin and subcutaneous tissue, unspecified: Secondary | ICD-10-CM

## 2023-08-19 DIAGNOSIS — E119 Type 2 diabetes mellitus without complications: Secondary | ICD-10-CM

## 2023-08-19 DIAGNOSIS — I739 Peripheral vascular disease, unspecified: Secondary | ICD-10-CM

## 2023-08-19 DIAGNOSIS — E1122 Type 2 diabetes mellitus with diabetic chronic kidney disease: Secondary | ICD-10-CM | POA: Diagnosis not present

## 2023-08-19 DIAGNOSIS — E781 Pure hyperglyceridemia: Secondary | ICD-10-CM

## 2023-08-19 LAB — CBC WITH DIFFERENTIAL/PLATELET
Basophils Absolute: 0 10*3/uL (ref 0.0–0.1)
Basophils Relative: 0.4 % (ref 0.0–3.0)
Eosinophils Absolute: 0.3 10*3/uL (ref 0.0–0.7)
Eosinophils Relative: 5.7 % — ABNORMAL HIGH (ref 0.0–5.0)
HCT: 43.1 % (ref 39.0–52.0)
Hemoglobin: 14.5 g/dL (ref 13.0–17.0)
Lymphocytes Relative: 29.4 % (ref 12.0–46.0)
Lymphs Abs: 1.7 10*3/uL (ref 0.7–4.0)
MCHC: 33.8 g/dL (ref 30.0–36.0)
MCV: 88.8 fL (ref 78.0–100.0)
Monocytes Absolute: 0.5 10*3/uL (ref 0.1–1.0)
Monocytes Relative: 7.8 % (ref 3.0–12.0)
Neutro Abs: 3.3 10*3/uL (ref 1.4–7.7)
Neutrophils Relative %: 56.7 % (ref 43.0–77.0)
Platelets: 161 10*3/uL (ref 150.0–400.0)
RBC: 4.85 Mil/uL (ref 4.22–5.81)
RDW: 15.2 % (ref 11.5–15.5)
WBC: 5.9 10*3/uL (ref 4.0–10.5)

## 2023-08-19 LAB — LIPID PANEL
Cholesterol: 106 mg/dL (ref 0–200)
HDL: 26.1 mg/dL — ABNORMAL LOW (ref >39.00)
LDL Cholesterol: 20 mg/dL (ref 0–99)
NonHDL: 79.54
Total CHOL/HDL Ratio: 4
Triglycerides: 296 mg/dL — ABNORMAL HIGH (ref 0.0–149.0)
VLDL: 59.2 mg/dL — ABNORMAL HIGH (ref 0.0–40.0)

## 2023-08-19 LAB — URINALYSIS, ROUTINE W REFLEX MICROSCOPIC
Bilirubin Urine: NEGATIVE
Hgb urine dipstick: NEGATIVE
Ketones, ur: NEGATIVE
Leukocytes,Ua: NEGATIVE
Nitrite: NEGATIVE
RBC / HPF: NONE SEEN (ref 0–?)
Specific Gravity, Urine: 1.01 (ref 1.000–1.030)
Total Protein, Urine: NEGATIVE
Urine Glucose: >=1000 — AB
Urobilinogen, UA: 0.2 (ref 0.0–1.0)
WBC, UA: NONE SEEN (ref 0–?)
pH: 6 (ref 5.0–8.0)

## 2023-08-19 LAB — BASIC METABOLIC PANEL
BUN: 34 mg/dL — ABNORMAL HIGH (ref 6–23)
CO2: 28 meq/L (ref 19–32)
Calcium: 9.8 mg/dL (ref 8.4–10.5)
Chloride: 105 meq/L (ref 96–112)
Creatinine, Ser: 1.59 mg/dL — ABNORMAL HIGH (ref 0.40–1.50)
GFR: 43.18 mL/min — ABNORMAL LOW (ref >60.00)
Glucose, Bld: 141 mg/dL — ABNORMAL HIGH (ref 70–99)
Potassium: 4.8 meq/L (ref 3.5–5.1)
Sodium: 142 meq/L (ref 135–145)

## 2023-08-19 LAB — BRAIN NATRIURETIC PEPTIDE: Pro B Natriuretic peptide (BNP): 238 pg/mL — ABNORMAL HIGH (ref 0.0–100.0)

## 2023-08-19 LAB — TROPONIN I (HIGH SENSITIVITY): High Sens Troponin I: 16 ng/L (ref 2–17)

## 2023-08-19 LAB — HEMOGLOBIN A1C: Hgb A1c MFr Bld: 6.5 % (ref 4.6–6.5)

## 2023-08-19 LAB — TSH: TSH: 1.64 u[IU]/mL (ref 0.35–5.50)

## 2023-08-19 LAB — CORTISOL: Cortisol, Plasma: 7.4 ug/dL

## 2023-08-19 NOTE — Progress Notes (Unsigned)
Subjective:  Patient ID: William [name], male    DOB: [birth year]  Age: 73 y.o. MRN: [id number]  CC: Annual Exam, Hyperlipidemia, and Diabetes   HPI William [name] presents for a CPX and f/up ---    Discussed the use of AI scribe software for clinical note transcription with the patient, who gave verbal consent to proceed.  History of Present Illness   William [name] is a 73 year old male who presents with a persistent foot sore and pain. He was referred by a doctor in the same office for evaluation of a possible skin cancer on the foot.  He has had a persistent sore on his foot for almost a year, which causes ongoing pain and bleeds when cleaned. Despite previous procedures to clear the arteries, the sore has not healed. Prior to the arterial procedure, a doctor suggested the possibility of skin cancer. He has undergone a foot examination and x-rays, which showed no bone abnormalities.       Outpatient Medications Prior to Visit  Medication Sig Dispense Refill   acetaminophen (TYLENOL) 500 MG tablet Take 500 mg by mouth every 6 (six) hours as needed for moderate pain.     Blood Glucose Monitoring Suppl DEVI 1 each by Does not apply route in the morning, at noon, and at bedtime. May substitute to any manufacturer covered by patient's insurance. 1 each 1   carvedilol (COREG) 3.125 MG tablet TAKE 1 TABLET(3.125 MG) BY MOUTH TWICE DAILY WITH A MEAL 180 tablet 1   clopidogrel (PLAVIX) 75 MG tablet Take 1 tablet (75 mg total) by mouth daily. 30 tablet 3   Continuous Glucose Receiver (DEXCOM G7 RECEIVER) DEVI 1 Act by Does not apply route daily. 9 each 1   Continuous Glucose Sensor (DEXCOM G7 SENSOR) MISC 1 Act by Does not apply route daily. 9 each 1   FARXIGA 10 MG TABS tablet TAKE 1 TABLET(10 MG) BY MOUTH DAILY 90 tablet 0   gabapentin (NEURONTIN) 300 MG capsule TAKE 1 CAPSULE(300 MG) BY MOUTH TWICE DAILY 180 capsule 0   Insulin Pen Needle 31G X 8 MM MISC 1 each by Does not apply route 4 (four)  times daily. 300 each 2   OZEMPIC, 0.25 OR 0.5 MG/DOSE, 2 MG/3ML SOPN Inject 0.25 mg into the skin once a week.     pantoprazole (PROTONIX) 40 MG tablet TAKE 1 TABLET(40 MG) BY MOUTH TWICE DAILY BEFORE A MEAL 180 tablet 0   pramipexole (MIRAPEX) 0.125 MG tablet TAKE 1 TABLET(0.125 MG) BY MOUTH AT BEDTIME 90 tablet 1   silver sulfADIAZINE (SILVADENE) 1 % cream Apply 1 Application topically daily. (Patient taking differently: Apply 1 Application topically daily as needed (Wound).) 400 g 2   torsemide (DEMADEX) 20 MG tablet Take 1 tablet (20 mg total) by mouth every other day. TAKE 1 TABLET(20 MG) BY MOUTH 3 TIMES A WEEK Monday, Wednesday and Friday (Patient taking differently: Take 20 mg by mouth every Monday, Wednesday, and Friday.) 38 tablet 0   warfarin (COUMADIN) 5 MG tablet TAKE 1 TO 1 AND 1/2 TABLETS BY MOUTH DAILY AS DIRECTED BY COUMADIN CLINIC (Patient taking differently: Take 5-7.5 mg by mouth See admin instructions. Take 5 mg on Mon., Wed., And Fri., All the other days take 7.5 mg  BY MOUTH DAILY AS DIRECTED BY COUMADIN CLINIC) 120 tablet 0   HUMALOG KWIKPEN 100 UNIT/ML KwikPen INJECT 10 UNITS UNDER THE SKIN THREE TIMES DAILY AS DIRECTED BY SLIDING SCALE (Patient taking differently:  Inject 4 Units into the skin in the morning, at noon, and at bedtime. Sliding scale) 27 mL 1   insulin glargine (LANTUS SOLOSTAR) 100 UNIT/ML Solostar Pen Inject 55 Units into the skin at bedtime.     rosuvastatin (CRESTOR) 40 MG tablet TAKE 1 TABLET BY MOUTH EVERY DAY 90 tablet 1   Continuous Glucose Sensor (FREESTYLE LIBRE 2 SENSOR) MISC APPLY A NEW SENSOR EVERY 14 DAYS 2 each 5   VASCEPA 1 g capsule TAKE 2 CAPSULES(2 GRAMS) BY MOUTH TWICE DAILY 360 capsule 1   No facility-administered medications prior to visit.    ROS Review of Systems  Constitutional:  Positive for unexpected weight change (wt gain). Negative for appetite change, chills, diaphoresis and fatigue.  HENT: Negative.  Negative for trouble  swallowing.   Eyes: Negative.  Negative for visual disturbance.  Respiratory: Negative.  Negative for cough, chest tightness, shortness of breath and wheezing.   Cardiovascular:  Negative for chest pain, palpitations and leg swelling.  Gastrointestinal:  Negative for abdominal pain, constipation, diarrhea, nausea and vomiting.  Genitourinary: Negative.  Negative for difficulty urinating.  Musculoskeletal:  Positive for gait problem. Negative for arthralgias and myalgias.  Skin: Negative.   Neurological:  Positive for weakness. Negative for light-headedness.  Hematological:  Negative for adenopathy. Does not bruise/bleed easily.  Psychiatric/Behavioral:  Positive for confusion and decreased concentration.     Objective:  BP (!) 92/58 (BP Location: Right Arm, Patient Position: Sitting, Cuff Size: Normal)   Pulse 71   Temp 97.8 F (36.6 C) (Oral)   Resp 16   Ht 5\' 5"  (1.651 m)   Wt 185 lb 9.6 oz (84.2 kg)   SpO2 98%   BMI 30.89 kg/m   BP Readings from Last 3 Encounters:  08/20/23 121/72  08/19/23 (!) 92/58  07/17/23 (!) 143/70    Wt Readings from Last 3 Encounters:  08/20/23 188 lb 3.2 oz (85.4 kg)  08/19/23 185 lb 9.6 oz (84.2 kg)  07/17/23 180 lb (81.6 kg)    Physical Exam Vitals reviewed.  Constitutional:      General: He is not in acute distress.    Appearance: He is ill-appearing. He is not toxic-appearing or diaphoretic.  HENT:     Mouth/Throat:     Mouth: Mucous membranes are moist.  Eyes:     General: No scleral icterus.    Conjunctiva/sclera: Conjunctivae normal.  Cardiovascular:     Rate and Rhythm: Normal rate and regular rhythm.     Heart sounds: S1 normal and S2 normal. Murmur heard.     Systolic murmur is present with a grade of 1/6.     No friction rub. No gallop.     Comments: EKG-- NSR, 67 bpm NS IV block Anterior/inferior infarct patterns are not new Unchanged  Pulmonary:     Effort: Pulmonary effort is normal.     Breath sounds: No stridor.  No wheezing, rhonchi or rales.  Abdominal:     General: Abdomen is flat.     Palpations: There is no mass.     Tenderness: There is no abdominal tenderness. There is no guarding.     Hernia: No hernia is present.  Musculoskeletal:     Cervical back: Neck supple.     Right lower leg: Edema (trace pitting) present.     Left lower leg: Edema (trace pitting) present.  Skin:    General: Skin is warm and dry.     Findings: Lesion present.  Neurological:  Mental Status: He is alert. Mental status is at baseline.  Psychiatric:        Mood and Affect: Mood normal.        Behavior: Behavior normal.     Lab Results  Component Value Date   WBC 5.9 08/19/2023   HGB 14.5 08/19/2023   HCT 43.1 08/19/2023   PLT 161.0 08/19/2023   GLUCOSE 141 (H) 08/19/2023   CHOL 106 08/19/2023   TRIG 296.0 (H) 08/19/2023   HDL 26.10 (L) 08/19/2023   LDLDIRECT 54.0 11/05/2022   LDLCALC 20 08/19/2023   ALT 43 06/06/2023   AST 37 06/06/2023   NA 142 08/19/2023   K 4.8 08/19/2023   CL 105 08/19/2023   CREATININE 1.59 (H) 08/19/2023   BUN 34 (H) 08/19/2023   CO2 28 08/19/2023   TSH 1.64 08/19/2023   PSA 3.50 05/07/2022   INR 2.2 08/20/2023   HGBA1C 6.5 08/19/2023   MICROALBUR 2.6 (H) 04/29/2023    VAS [place] ABI WITH/WO TBI Result Date: 08/07/2023  LOWER EXTREMITY DOPPLER STUDY Patient Name:  Taequan Stockhausen  Date of Exam:   08/06/2023 Medical Rec #: [id number]    Accession #:    [id number] Date of Birth: 09-13-1950   Patient Gender: M Patient Age:   16 years Exam Location:  [facility] Procedure:      VAS [place] ABI WITH/WO TBI Referring Phys: [place] Ouch MD --------------------------------------------------------------------------------  Indications: Peripheral artery disease. Patient c/o constant throbbing in the              left leg and left great toe at rest. He reports that he does not              experience any throbbing when walking, and he denies any clinical              claudication symptoms. High  Risk Factors: Hypertension, hyperlipidemia, Diabetes, past history of                    smoking, coronary artery disease.  Vascular Interventions: Successful drug-coated balloon angioplasty to the left                         SFA on 07/17/2023. Successful angioplasty followed by                         drug-coated balloon angioplasty of the right distal SFA                         on 05/02/2016. Comparison Study: In 06/2023, a lower arterial Doppler showed an ABI of .86 on                   the right and .72 on the left. Performing Technologist: Tyna Jaksch RVT  Examination Guidelines: A complete evaluation includes at minimum, Doppler waveform signals and systolic blood pressure reading at the level of bilateral brachial, anterior tibial, and posterior tibial arteries, when vessel segments are accessible. Bilateral testing is considered an integral part of a complete examination. Photoelectric Plethysmograph (PPG) waveforms and toe systolic pressure readings are included as required and additional duplex testing as needed. Limited examinations for reoccurring indications may be performed as noted.  ABI Findings: +---------+------------------+-----+-----------+--------------------------+ Right    Rt Pressure (mmHg)IndexWaveform   Comment                    +---------+------------------+-----+-----------+--------------------------+  Brachial 95                                                           +---------+------------------+-----+-----------+--------------------------+ PTA      116               1.01 multiphasic                           +---------+------------------+-----+-----------+--------------------------+ DP       89                0.77 monophasic retrograde                 +---------+------------------+-----+-----------+--------------------------+ Great Toe                                  transmetatarsal amputation  +---------+------------------+-----+-----------+--------------------------+ +---------+------------------+-----+----------+-------+ Left     Lt Pressure (mmHg)IndexWaveform  Comment +---------+------------------+-----+----------+-------+ Brachial 115                                      +---------+------------------+-----+----------+-------+ PTA      111               0.97 monophasic        +---------+------------------+-----+----------+-------+ DP       81                0.70 monophasic        +---------+------------------+-----+----------+-------+ Great Toe40                0.35 Abnormal          +---------+------------------+-----+----------+-------+ +-------+-----------+----------------------+------------+----------------------+ ABI/TBIToday's ABIToday's TBI           Previous ABIPrevious TBI           +-------+-----------+----------------------+------------+----------------------+ Right  1.01       transmetatarsal       .86         transmetatarsal                          amputation                        amputation             +-------+-----------+----------------------+------------+----------------------+ Left   .97        .35                   .72         .34                    +-------+-----------+----------------------+------------+----------------------+ Bilateral ABIs appear increased compared to prior study on 06/07/2023. Left TBIs appear essentially unchanged compared to prior study on 06/07/2023.  Summary: Right: Resting right ankle-brachial index is within normal range. Although ankle brachial indices are within normal limits (0.95-1.29), arterial Doppler waveforms at the ankle suggest some component of arterial occlusive disease. Transmetatarsal amputation. Left: Resting left ankle-brachial index is within normal range. The left toe-brachial index is abnormal. Although ankle brachial indices are within normal limits (0.95-1.29), arterial Doppler  waveforms at  the ankle suggest some component of arterial occlusive disease. There is a 20 mmHg pressure difference between the arms, the right being the lowest. Consider possible right subclavian artery stenosis. *See table(s) above for measurements and observations. See LE Arterial duplex report. Suggest follow up study in 6 months. Electronically signed by Julien Nordmann MD on 08/07/2023 at 5:58:38 PM.    Final    VAS [place] LOWER EXTREMITY ARTERIAL DUPLEX Result Date: 08/07/2023 LOWER EXTREMITY ARTERIAL DUPLEX STUDY Patient Name:  BRYDEN DARDEN  Date of Exam:   08/06/2023 Medical Rec #: [id number]    Accession #:    [id number] Date of Birth: 23-Aug-1950   Patient Gender: M Patient Age:   72 years Exam Location:  [facility] Procedure:      VAS [place] LOWER EXTREMITY ARTERIAL DUPLEX Referring Phys: [facility] ARIDA --------------------------------------------------------------------------------  Indications: Peripheral artery disease, and patient c/o constant throbbing in              the left leg and left great toe at rest. He reports that he does              not experience any throbbing when walking, and he denies any              clinical claudication symptoms. High Risk Factors: Hypertension, hyperlipidemia, Diabetes, past history of                    smoking, coronary artery disease.  Vascular Interventions: Successful drug-coated balloon angioplasty to the left                         SFA on 07/17/2023. Successful angioplasty followed by                         drug-coated balloon angioplasty of the right distal SFA                         on 05/02/2016. Current ABI:            1.01 on the right and .97 on the left. Comparison Study: In 06/2023, a lower arterial duplex showed 75-99% stenosis in                   the left proximal SFA distal to ostium. One vessel run-off via                   peroneal artery. Occluded proximal PTA and mid ATA. Performing Technologist: Tyna Jaksch RVT  Examination Guidelines: A complete  evaluation includes B-mode imaging, spectral Doppler, color Doppler, and power Doppler as needed of all accessible portions of each vessel. Bilateral testing is considered an integral part of a complete examination. Limited examinations for reoccurring indications may be performed as noted.   +----------+--------+-----+---------------+-----------+------------------------+ LEFT      PSV cm/sRatioStenosis       Waveform   Comments                 +----------+--------+-----+---------------+-----------+------------------------+ CFA Prox  127                         biphasic                            +----------+--------+-----+---------------+-----------+------------------------+ SFA Prox  219          50-74%  stenosisbiphasic   50-99% stenosis, s/p                                                      angioplasty              +----------+--------+-----+---------------+-----------+------------------------+ SFA Mid   173                         triphasic                           +----------+--------+-----+---------------+-----------+------------------------+ SFA Distal171                         biphasic                            +----------+--------+-----+---------------+-----------+------------------------+ POP Prox  192          30-49% stenosismultiphasic                         +----------+--------+-----+---------------+-----------+------------------------+ POP Mid   173          30-49% stenosismultiphasic                         +----------+--------+-----+---------------+-----------+------------------------+ POP Distal86                          triphasic                           +----------+--------+-----+---------------+-----------+------------------------+ TP Trunk  177          30-49% stenosismultiphasic                         +----------+--------+-----+---------------+-----------+------------------------+ A focal velocity elevation of 219  cm/s was obtained at proximal SFA distal to ostium with a VR of 2.1. Findings are characteristic of 50-74% stenosis.  Summary: Left: Moderate improvement is noted compared to previous study. Atherosclerosis throughout. 50-99% stenosis in the proximal SFA distal to ostium, s/p angioplasty. 30-49% stenosis in the above-and-behind-the-knee popliteal artery. 30-49% stenosis in the mid and distal TPT.  See table(s) above for measurements and observations. See ABI report. Suggest follow up study in 6 months. Electronically signed by Julien Nordmann MD on 08/07/2023 at 5:57:57 PM.    Final    ECHOCARDIOGRAM COMPLETE Result Date: 08/20/2023    ECHOCARDIOGRAM REPORT   Patient Name:   JASHAN COTTEN   Date of Exam: 08/20/2023 Medical Rec #:  [id number]     Height:       65.0 in Accession #:    [id number]    Weight:       188.2 lb Date of Birth:  [birth year]    BSA:          1.928 m Patient Age:    72 years      BP:           121/72 mmHg Patient Gender: M             HR:           73 bpm. Exam Location:  [address] Procedure: 2D Echo, Cardiac Doppler and Color Doppler Indications:    Abnormal ECG R94.31  History:        Patient has prior history of Echocardiogram examinations, most                 recent 09/10/2016. CHF, Previous Myocardial Infarction; Risk                 Factors:Diabetes, Hypertension and Dyslipidemia.  Sonographer:    Thurman Coyer RDCS Referring Phys: [id number] [name] IMPRESSIONS  1. Akinesis of the inferior and inferosepal walls with overall mild to moderate LV dysfunction.  2. Left ventricular ejection fraction, by estimation, is 40 to 45%. The left ventricle has mild to moderately decreased function. The left ventricle demonstrates regional wall motion abnormalities (see scoring diagram/findings for description). The left  ventricular internal cavity size was mildly dilated. Left ventricular diastolic parameters are consistent with Grade I diastolic dysfunction (impaired relaxation). Elevated left  atrial pressure.  3. Right ventricular systolic function is normal. The right ventricular size is normal. Tricuspid regurgitation signal is inadequate for assessing PA pressure.  4. Left atrial size was mildly dilated.  5. The mitral valve is normal in structure. Mild mitral valve regurgitation. No evidence of mitral stenosis.  6. The aortic valve is tricuspid. Aortic valve regurgitation is mild. Aortic valve sclerosis is present, with no evidence of aortic valve stenosis.  7. The inferior vena cava is normal in size with greater than 50% respiratory variability, suggesting right atrial pressure of 3 mmHg. FINDINGS  Left Ventricle: Left ventricular ejection fraction, by estimation, is 40 to 45%. The left ventricle has mild to moderately decreased function. The left ventricle demonstrates regional wall motion abnormalities. The left ventricular internal cavity size was mildly dilated. There is no left ventricular hypertrophy. Left ventricular diastolic parameters are consistent with Grade I diastolic dysfunction (impaired relaxation). Elevated left atrial pressure. Right Ventricle: The right ventricular size is normal. Right ventricular systolic function is normal. Tricuspid regurgitation signal is inadequate for assessing PA pressure. The tricuspid regurgitant velocity is 2.32 m/s, and with an assumed right atrial  pressure of 8 mmHg, the estimated right ventricular systolic pressure is 29.5 mmHg. Left Atrium: Left atrial size was mildly dilated. Right Atrium: Right atrial size was normal in size. Pericardium: There is no evidence of pericardial effusion. Mitral Valve: The mitral valve is normal in structure. Mild mitral valve regurgitation. No evidence of mitral valve stenosis. Tricuspid Valve: The tricuspid valve is normal in structure. Tricuspid valve regurgitation is mild . No evidence of tricuspid stenosis. Aortic Valve: The aortic valve is tricuspid. Aortic valve regurgitation is mild. Aortic regurgitation PHT  measures 352 msec. Aortic valve sclerosis is present, with no evidence of aortic valve stenosis. Pulmonic Valve: The pulmonic valve was normal in structure. Pulmonic valve regurgitation is trivial. No evidence of pulmonic stenosis. Aorta: The aortic root is normal in size and structure. Venous: The inferior vena cava is normal in size with greater than 50% respiratory variability, suggesting right atrial pressure of 3 mmHg. IAS/Shunts: No atrial level shunt detected by color flow Doppler. Additional Comments: Akinesis of the inferior and inferosepal walls with overall mild to moderate LV dysfunction.  LEFT VENTRICLE PLAX 2D LVIDd:         5.70 cm   Diastology LVIDs:         4.30 cm   LV e' medial:    3.65 cm/s LV PW:  1.00 cm   LV E/e' medial:  27.7 LV IVS:        0.90 cm   LV e' lateral:   6.89 cm/s LVOT diam:     2.10 cm   LV E/e' lateral: 14.7 LV SV:         79 LV SV Index:   41 LVOT Area:     3.46 cm                           3D Volume EF:                          3D EF:        50 %                          LV EDV:       133 ml                          LV ESV:       66 ml                          LV SV:        67 ml RIGHT VENTRICLE            IVC RV Basal diam:  4.20 cm    IVC diam: 2.30 cm RV Mid diam:    3.00 cm RV S prime:     7.78 cm/s TAPSE (M-mode): 1.1 cm LEFT ATRIUM             Index        RIGHT ATRIUM           Index LA diam:        4.50 cm 2.33 cm/m   RA Area:     17.50 cm LA Vol (A2C):   34.8 ml 18.05 ml/m  RA Volume:   45.90 ml  23.81 ml/m LA Vol (A4C):   68.5 ml 35.54 ml/m LA Biplane Vol: 53.2 ml 27.60 ml/m  AORTIC VALVE LVOT Vmax:   92.20 cm/s LVOT Vmean:  63.900 cm/s LVOT VTI:    0.228 m AI PHT:      352 msec  AORTA Ao Root diam: 3.10 cm Ao Asc diam:  3.00 cm MITRAL VALVE                  TRICUSPID VALVE MV Area (PHT): 3.85 cm       TR Peak grad:   21.5 mmHg MV Decel Time: 197 msec       TR Vmax:        232.00 cm/s MR Peak grad:    84.3 mmHg MR Mean grad:    58.0 mmHg    SHUNTS MR  Vmax:         459.00 cm/s  Systemic VTI:  0.23 m MR Vmean:        367.0 cm/s   Systemic Diam: 2.10 cm MR PISA:         3.08 cm MR PISA Eff ROA: 21 mm MR PISA Radius:  0.70 cm MV E velocity: 101.00 cm/s MV A velocity: 112.00 cm/s MV E/A ratio:  0.90 Olga Millers MD Electronically signed by Olga Millers MD Signature Date/Time: 08/20/2023/3:27:05 PM    Final  Assessment & Plan:  Idiopathic hypotension -     Basic metabolic panel; Future -     CBC with Differential/Platelet; Future -     TSH; Future -     Urinalysis, Routine w reflex microscopic; Future -     Cortisol; Future -     EKG 12-Lead -     ECHOCARDIOGRAM COMPLETE; Future -     Brain natriuretic peptide; Future -     Troponin I (High Sensitivity); Future  CKD stage 3b, GFR 30-44 ml/min ([name]) -     Basic metabolic panel; Future -     Urinalysis, Routine w reflex microscopic; Future  Type 2 diabetes mellitus with stage 3b chronic kidney disease, without long-term current use of insulin ([name]) -     Hemoglobin A1c; Future -     HM Diabetes Foot Exam  Paroxysmal atrial fibrillation ([name])- He has good R/R control. -     TSH; Future  Asymptomatic microscopic hematuria -     Urinalysis, Routine w reflex microscopic; Future  Chronic heart failure with mildly reduced ejection fraction (HFmrEF, 41-49%) ([name]) -     ECHOCARDIOGRAM COMPLETE; Future -     Brain natriuretic peptide; Future -     Troponin I (High Sensitivity); Future -     Ambulatory referral to Cardiology  Hyperlipidemia LDL goal <70 -     Lipid panel; Future  Encounter for general adult medical examination with abnormal findings - Exam completed, labs reviewed, vaccines reviewed, cancer screenings are UTD, pt ed material was given.   Abnormal electrocardiogram (ECG) (EKG) -     ECHOCARDIOGRAM COMPLETE; Future -     Brain natriuretic peptide; Future -     Troponin I (High Sensitivity); Future  Insulin-requiring or dependent type II diabetes mellitus ([name])-  Blood sugar is well controlled.  Skin lesion of foot -     Ambulatory referral to Dermatology     Follow-up: Return in about 3 months (around 11/16/2023).  Sanda Linger, MD

## 2023-08-19 NOTE — Patient Instructions (Signed)
 Health Maintenance, Male  Adopting a healthy lifestyle and getting preventive care are important in promoting health and wellness. Ask your health care provider about:  The right schedule for you to have regular tests and exams.  Things you can do on your own to prevent diseases and keep yourself healthy.  What should I know about diet, weight, and exercise?  Eat a healthy diet    Eat a diet that includes plenty of vegetables, fruits, low-fat dairy products, and lean protein.  Do not eat a lot of foods that are high in solid fats, added sugars, or sodium.  Maintain a healthy weight  Body mass index (BMI) is a measurement that can be used to identify possible weight problems. It estimates body fat based on height and weight. Your health care provider can help determine your BMI and help you achieve or maintain a healthy weight.  Get regular exercise  Get regular exercise. This is one of the most important things you can do for your health. Most adults should:  Exercise for at least 150 minutes each week. The exercise should increase your heart rate and make you sweat (moderate-intensity exercise).  Do strengthening exercises at least twice a week. This is in addition to the moderate-intensity exercise.  Spend less time sitting. Even light physical activity can be beneficial.  Watch cholesterol and blood lipids  Have your blood tested for lipids and cholesterol at 73 years of age, then have this test every 5 years.  You may need to have your cholesterol levels checked more often if:  Your lipid or cholesterol levels are high.  You are older than 73 years of age.  You are at high risk for heart disease.  What should I know about cancer screening?  Many types of cancers can be detected early and may often be prevented. Depending on your health history and family history, you may need to have cancer screening at various ages. This may include screening for:  Colorectal cancer.  Prostate cancer.  Skin cancer.  Lung  cancer.  What should I know about heart disease, diabetes, and high blood pressure?  Blood pressure and heart disease  High blood pressure causes heart disease and increases the risk of stroke. This is more likely to develop in people who have high blood pressure readings or are overweight.  Talk with your health care provider about your target blood pressure readings.  Have your blood pressure checked:  Every 3-5 years if you are 9-95 years of age.  Every year if you are 85 years old or older.  If you are between the ages of 29 and 29 and are a current or former smoker, ask your health care provider if you should have a one-time screening for abdominal aortic aneurysm (AAA).  Diabetes  Have regular diabetes screenings. This checks your fasting blood sugar level. Have the screening done:  Once every three years after age 23 if you are at a normal weight and have a low risk for diabetes.  More often and at a younger age if you are overweight or have a high risk for diabetes.  What should I know about preventing infection?  Hepatitis B  If you have a higher risk for hepatitis B, you should be screened for this virus. Talk with your health care provider to find out if you are at risk for hepatitis B infection.  Hepatitis C  Blood testing is recommended for:  Everyone born from [birth year] through 1965.  Anyone  with known risk factors for hepatitis C.  Sexually transmitted infections (STIs)  You should be screened each year for STIs, including gonorrhea and chlamydia, if:  You are sexually active and are younger than 73 years of age.  You are older than 73 years of age and your health care provider tells you that you are at risk for this type of infection.  Your sexual activity has changed since you were last screened, and you are at increased risk for chlamydia or gonorrhea. Ask your health care provider if you are at risk.  Ask your health care provider about whether you are at high risk for HIV. Your health care provider  may recommend a prescription medicine to help prevent HIV infection. If you choose to take medicine to prevent HIV, you should first get tested for HIV. You should then be tested every 3 months for as long as you are taking the medicine.  Follow these instructions at home:  Alcohol use  Do not drink alcohol if your health care provider tells you not to drink.  If you drink alcohol:  Limit how much you have to 0-2 drinks a day.  Know how much alcohol is in your drink. In the [place] one drink equals one 12 oz bottle of beer (355 mL), one 5 oz glass of wine (148 mL), or one 1 oz glass of hard liquor (44 mL).  Lifestyle  Do not use any products that contain nicotine or tobacco. These products include cigarettes, chewing tobacco, and vaping devices, such as e-cigarettes. If you need help quitting, ask your health care provider.  Do not use street drugs.  Do not share needles.  Ask your health care provider for help if you need support or information about quitting drugs.  General instructions  Schedule regular health, dental, and eye exams.  Stay current with your vaccines.  Tell your health care provider if:  You often feel depressed.  You have ever been abused or do not feel safe at home.  Summary  Adopting a healthy lifestyle and getting preventive care are important in promoting health and wellness.  Follow your health care provider's instructions about healthy diet, exercising, and getting tested or screened for diseases.  Follow your health care provider's instructions on monitoring your cholesterol and blood pressure.  This information is not intended to replace advice given to you by your health care provider. Make sure you discuss any questions you have with your health care provider.  Document Revised: 11/07/2020 Document Reviewed: 11/07/2020  Elsevier Patient Education  2024 [employer].

## 2023-08-20 ENCOUNTER — Ambulatory Visit (HOSPITAL_BASED_OUTPATIENT_CLINIC_OR_DEPARTMENT_OTHER): Payer: 59

## 2023-08-20 ENCOUNTER — Ambulatory Visit: Payer: 59 | Attending: Cardiovascular Disease | Admitting: Cardiovascular Disease

## 2023-08-20 ENCOUNTER — Encounter: Payer: Self-pay | Admitting: Cardiovascular Disease

## 2023-08-20 ENCOUNTER — Ambulatory Visit: Payer: 59

## 2023-08-20 VITALS — BP 121/72 | HR 84 | Wt 188.2 lb

## 2023-08-20 DIAGNOSIS — R9431 Abnormal electrocardiogram [ECG] [EKG]: Secondary | ICD-10-CM

## 2023-08-20 DIAGNOSIS — I480 Paroxysmal atrial fibrillation: Secondary | ICD-10-CM | POA: Diagnosis not present

## 2023-08-20 DIAGNOSIS — I2510 Atherosclerotic heart disease of native coronary artery without angina pectoris: Secondary | ICD-10-CM

## 2023-08-20 DIAGNOSIS — E785 Hyperlipidemia, unspecified: Secondary | ICD-10-CM | POA: Diagnosis not present

## 2023-08-20 DIAGNOSIS — Z5181 Encounter for therapeutic drug level monitoring: Secondary | ICD-10-CM

## 2023-08-20 DIAGNOSIS — S91302A Unspecified open wound, left foot, initial encounter: Secondary | ICD-10-CM | POA: Diagnosis not present

## 2023-08-20 DIAGNOSIS — I5022 Chronic systolic (congestive) heart failure: Secondary | ICD-10-CM

## 2023-08-20 DIAGNOSIS — I739 Peripheral vascular disease, unspecified: Secondary | ICD-10-CM

## 2023-08-20 DIAGNOSIS — I950 Idiopathic hypotension: Secondary | ICD-10-CM | POA: Diagnosis not present

## 2023-08-20 DIAGNOSIS — S91302D Unspecified open wound, left foot, subsequent encounter: Secondary | ICD-10-CM

## 2023-08-20 DIAGNOSIS — L989 Disorder of the skin and subcutaneous tissue, unspecified: Secondary | ICD-10-CM | POA: Insufficient documentation

## 2023-08-20 LAB — POCT INR: INR: 2.2 (ref 2.0–3.0)

## 2023-08-20 LAB — ECHOCARDIOGRAM COMPLETE
Area-P 1/2: 3.85 cm2
MV M vel: 4.59 m/s
MV Peak grad: 84.3 mm[Hg]
P 1/2 time: 352 ms
Radius: 0.7 cm
S' Lateral: 4.3 cm
Weight: 3011.2 [oz_av]

## 2023-08-20 NOTE — Progress Notes (Signed)
Please let him that his heart failure has worsened some Is he willing to see cardiology again?

## 2023-08-20 NOTE — Progress Notes (Signed)
Cardiology Office Note   Date:  08/20/2023   ID:  William [name], DOB 19-Jan-1951, MRN [id number]  PCP:  [name], MD  Cardiologist:   Lorine Bears, MD   No chief complaint on file.      History of Present Illness: William [name] is a 73 y.o. male who is here today for a follow-up visit regarding peripheral arterial disease, coronary artery disease and paroxysmal atrial fibrillation. He has known history of inferior myocardial infarction complicated by VSD in September, 2017. He is status post one-vessel CABG and VSD repair. He had postoperative atrial fibrillation and has been on anticoagulation. He had amputation of the right great toe while hospitalized for gangrene. The patient has known history of diabetes.  He is known to have peripheral arterial disease. Angiography in November, 2017 showed no significant aortoiliac disease, significant right distal SFA stenosis and one-vessel runoff below the knee via the peroneal artery with reconstitution of the dorsalis pedis distally. I performed successful drug-coated balloon angioplasty of the right SFA. He is s/p right transmetatarsal amputation for osteomyelitis.    He had angiography to the left leg in March 2018 which showed moderate stenosis affecting the left popliteal artery and TP trunk with 1 vessel runoff below the knee via the peroneal artery which was a large dominant vessel and reconstituted the dorsalis pedis and posterior tibial at the level of the ankle. There was brisk flow to the toes. Thus, no revascularization was performed.  He developed a small ulceration between the fifth and fourth toes on the left side and recently had significant swelling in the left foot.  He was treated with multiple rounds of antibiotics.  MRI showed no evidence of osteomyelitis.  The ulceration improved significantly but has not healed completely.  In addition, he is having worsening left foot discomfort.    He underwent repeat Doppler  studies which showed an ABI of 0.72 on the left with evidence of severe stenosis in the left proximal SFA with one-vessel runoff below the knee. Angiography was performed last month which showed significant stenosis in the proximal/mid SFA, moderate popliteal artery stenosis and one-vessel runoff below the knee via the peroneal artery which reconstituted the pedal arch distally.  I performed successful drug-coated balloon angioplasty to the left SFA.  Postprocedure ABI improved to normal but toe pressure was still moderately reduced at 40 mmHg.  Unfortunately, the wound has not healed completely and he continues to have localized discomfort in that area as well as in the thigh.  He was seen by his primary care physician yesterday and was noted to be mildly hypotensive but overall is asymptomatic.  He is scheduled for an echocardiogram.   Past Medical History:  Diagnosis Date   AKI (acute kidney injury) ([name])    With STEMI in 2017   Chronic systolic CHF (congestive heart failure) ([name])    Depression    Diabetes mellitus without complication ([name])    Diabetic retinopathy ([name])    GERD (gastroesophageal reflux disease)    Hx of adenomatous colonic polyps 04/07/2018   Hyperlipidemia    Hypertension    Hypertensive retinopathy    Paroxysmal atrial fibrillation ([name])    Peripheral vascular disease ([name])    Pneumonia    Seizures ([name])    hx of as a child   STEMI (ST elevation myocardial infarction) ([name]) 2017    Past Surgical History:  Procedure Laterality Date   ABDOMINAL AORTOGRAM W/LOWER EXTREMITY N/A 09/19/2016   Procedure: Abdominal  Aortogram w/Lower Extremity;  Surgeon: Iran Ouch, MD;  Location: [facility];  Service: Cardiovascular;  Laterality: N/A;   ABDOMINAL AORTOGRAM W/LOWER EXTREMITY N/A 07/17/2023   Procedure: ABDOMINAL AORTOGRAM W/LOWER EXTREMITY;  Surgeon: Iran Ouch, MD;  Location: [facility];  Service: Cardiovascular;  Laterality: N/A;    AMPUTATION Right 03/23/2016   Procedure: 1st and 2nd Ray Amputation Right Foot;  Surgeon: Nadara Mustard, MD;  Location: [facility] OR;  Service: Orthopedics;  Laterality: Right;   AMPUTATION Right 06/21/2016   Procedure: RIGHT TRANSMETATARSAL AMPUTATION;  Surgeon: Nadara Mustard, MD;  Location: [facility] OR;  Service: Orthopedics;  Laterality: Right;   CARDIAC CATHETERIZATION N/A 03/13/2016   Procedure: Right/Left Heart Cath and Coronary Angiography;  Surgeon: Tonny Bollman, MD;  Location: [facility] INV[facility] CV LAB;  Service: Cardiovascular;  Laterality: N/A;   CARDIAC CATHETERIZATION N/A 03/13/2016   Procedure: IABP Insertion;  Surgeon: Tonny Bollman, MD;  Location: [facility] INVASIVE CV LAB;  Service: Cardiovascular;  Laterality: N/A;   CATARACT EXTRACTION     CATARACT EXTRACTION, BILATERAL     CERVICAL FUSION  1982, 1992   has had 3 neck surgeries from breaking his neck   CIRCUMCISION N/A 11/07/2021   Procedure: CIRCUMCISION ADULT;  Surgeon: Despina Arias, MD;  Location: [facility] ORS;  Service: Urology;  Laterality: N/A;   CORONARY ARTERY BYPASS GRAFT N/A 03/13/2016   Procedure: CORONARY ARTERY BYPASS GRAFTING (CABG) x 1 (SVG to OM) with EVH from LEFT GREATER SAPHENOUS VEIN;  Surgeon: Kerin Perna, MD;  Location: [facility] OR;  Service: Open Heart Surgery;  Laterality: N/A;   EYE SURGERY     LOWER EXTREMITY ANGIOGRAM  05/02/2016   Procedure: Lower Extremity Angiogram;  Surgeon: Iran Ouch, MD;  Location: [facility];  Service: Cardiovascular;;  Limited left femoral runoff right femoral runoff   PERIPHERAL VASCULAR BALLOON ANGIOPLASTY  07/17/2023   Procedure: PERIPHERAL VASCULAR BALLOON ANGIOPLASTY;  Surgeon: Iran Ouch, MD;  Location: [facility];  Service: Cardiovascular;;   PERIPHERAL VASCULAR CATHETERIZATION N/A 05/02/2016   Procedure: Abdominal Aortogram;  Surgeon: Iran Ouch, MD;  Location: [facility];  Service: Cardiovascular;  Laterality: N/A;   PERIPHERAL VASCULAR  CATHETERIZATION Right 05/02/2016   Procedure: Peripheral Vascular Balloon Angioplasty;  Surgeon: Iran Ouch, MD;  Location: [facility];  Service: Cardiovascular;  Laterality: Right;  SFA   TEE WITHOUT CARDIOVERSION N/A 03/13/2016   Procedure: TRANSESOPHAGEAL ECHOCARDIOGRAM (TEE);  Surgeon: Kerin Perna, MD;  Location: [facility] OR;  Service: Open Heart Surgery;  Laterality: N/A;   VSD REPAIR N/A 03/13/2016   Procedure: VENTRICULAR SEPTAL DEFECT (VSD) REPAIR;  Surgeon: Kerin Perna, MD;  Location: [facility] OR;  Service: Open Heart Surgery;  Laterality: N/A;     Current Outpatient Medications  Medication Sig Dispense Refill   acetaminophen (TYLENOL) 500 MG tablet Take 500 mg by mouth every 6 (six) hours as needed for moderate pain.     Blood Glucose Monitoring Suppl DEVI 1 each by Does not apply route in the morning, at noon, and at bedtime. May substitute to any manufacturer covered by patient's insurance. 1 each 1   carvedilol (COREG) 3.125 MG tablet TAKE 1 TABLET(3.125 MG) BY MOUTH TWICE DAILY WITH A MEAL 180 tablet 1   clopidogrel (PLAVIX) 75 MG tablet Take 1 tablet (75 mg total) by mouth daily. 30 tablet 3   Continuous Glucose Receiver (DEXCOM G7 RECEIVER) DEVI 1 Act by Does not apply  route daily. 9 each 1   Continuous Glucose Sensor (DEXCOM G7 SENSOR) MISC 1 Act by Does not apply route daily. 9 each 1   FARXIGA 10 MG TABS tablet TAKE 1 TABLET(10 MG) BY MOUTH DAILY 90 tablet 0   gabapentin (NEURONTIN) 300 MG capsule TAKE 1 CAPSULE(300 MG) BY MOUTH TWICE DAILY 180 capsule 0   HUMALOG KWIKPEN 100 UNIT/ML KwikPen INJECT 10 UNITS UNDER THE SKIN THREE TIMES DAILY AS DIRECTED BY SLIDING SCALE (Patient taking differently: Inject 4 Units into the skin in the morning, at noon, and at bedtime. Sliding scale) 27 mL 1   insulin glargine (LANTUS SOLOSTAR) 100 UNIT/ML Solostar Pen Inject 55 Units into the skin at bedtime.     Insulin Pen Needle 31G X 8 MM MISC 1 each by Does not apply route 4 (four)  times daily. 300 each 2   OZEMPIC, 0.25 OR 0.5 MG/DOSE, 2 MG/3ML SOPN Inject 0.25 mg into the skin once a week.     pantoprazole (PROTONIX) 40 MG tablet TAKE 1 TABLET(40 MG) BY MOUTH TWICE DAILY BEFORE A MEAL 180 tablet 0   pramipexole (MIRAPEX) 0.125 MG tablet TAKE 1 TABLET(0.125 MG) BY MOUTH AT BEDTIME 90 tablet 1   rosuvastatin (CRESTOR) 40 MG tablet TAKE 1 TABLET BY MOUTH EVERY DAY 90 tablet 1   silver sulfADIAZINE (SILVADENE) 1 % cream Apply 1 Application topically daily. (Patient taking differently: Apply 1 Application topically daily as needed (Wound).) 400 g 2   torsemide (DEMADEX) 20 MG tablet Take 1 tablet (20 mg total) by mouth every other day. TAKE 1 TABLET(20 MG) BY MOUTH 3 TIMES A WEEK Monday, Wednesday and Friday (Patient taking differently: Take 20 mg by mouth every Monday, Wednesday, and Friday.) 38 tablet 0   VASCEPA 1 g capsule TAKE 2 CAPSULES(2 GRAMS) BY MOUTH TWICE DAILY 360 capsule 1   warfarin (COUMADIN) 5 MG tablet TAKE 1 TO 1 AND 1/2 TABLETS BY MOUTH DAILY AS DIRECTED BY COUMADIN CLINIC (Patient taking differently: Take 5-7.5 mg by mouth See admin instructions. Take 5 mg on Mon., Wed., And Fri., All the other days take 7.5 mg  BY MOUTH DAILY AS DIRECTED BY COUMADIN CLINIC) 120 tablet 0   No current facility-administered medications for this visit.    Allergies:   Morphine and codeine and Latex    Social History:  The patient  reports that he quit smoking about 7 years ago. His smoking use included cigarettes. He has never used smokeless tobacco. He reports that he does not drink alcohol and does not use drugs.   Family History:  Not able to obtain due to distress.  ROS:  Please see the history of present illness.   Otherwise, review of systems are positive for none.   All other systems are reviewed and negative.    PHYSICAL EXAM: VS:  BP 121/72 (BP Location: Left Arm, Patient Position: Sitting)   Pulse 84   Wt 188 lb 3.2 oz (85.4 kg)   SpO2 96%   BMI 31.32 kg/m   , BMI Body mass index is 31.32 kg/m. GEN: Well nourished, well developed, in no acute distress  HEENT: normal  Neck: no JVD, carotid bruits, or masses Cardiac: RRR; no  rubs, or gallops, . No murmurs Respiratory:  clear to auscultation bilaterally, normal work of breathing GI: soft, nontender, nondistended, + BS MS: no deformity or atrophy  Skin: warm and dry, no rash Neuro:  Strength and sensation are intact Psych: euthymic mood, full affect Distal pulses  are not palpable on the left side.  There is slight swelling.  There is a small ulceration between the fourth and fifth toes and a small ulceration at the base of the left big toe.   EKG:  EKG is  not ordered today.   Recent Labs: 06/06/2023: ALT 43 08/19/2023: BUN 34; Creatinine, Ser 1.59; Hemoglobin 14.5; Platelets 161.0; Potassium 4.8; Pro B Natriuretic peptide (BNP) 238.0; Sodium 142; TSH 1.64    Lipid Panel    Component Value Date/Time   CHOL 106 08/19/2023 1432   CHOL 90 (L) 01/05/2019 1123   TRIG 296.0 (H) 08/19/2023 1432   HDL 26.10 (L) 08/19/2023 1432   HDL 34 (L) 01/05/2019 1123   CHOLHDL 4 08/19/2023 1432   VLDL 59.2 (H) 08/19/2023 1432   LDLCALC 20 08/19/2023 1432   LDLCALC 24 01/05/2019 1123   LDLCALC 45 03/20/2017 1144   LDLDIRECT 54.0 11/05/2022 1141      Wt Readings from Last 3 Encounters:  08/20/23 188 lb 3.2 oz (85.4 kg)  08/19/23 185 lb 9.6 oz (84.2 kg)  07/17/23 180 lb (81.6 kg)          No data to display             ASSESSMENT AND PLAN:  1.  Peripheral arterial disease :  He is status post drug-coated balloon angioplasty of the right SFA in 2017.  Most recently, he had left SFA drug-coated balloon angioplasty with good results and improvement in ABI.  However, he continues to have a small ulceration between the fourth and fifth toes with significant discomfort. I referred him to the wound center. The plan is to discontinue Plavix after few months.  2. Chronic systolic heart  failure: He appears to be euvolemic.   He is no longer on losartan due to low blood pressure and chronic kidney disease.  Most recent echo in 2018 showed an EF of 45 to 50%.  He appears to be euvolemic on current dose of torsemide 20 mg 3 times a week.  Continue small dose carvedilol and [place].  He is scheduled for a follow-up echocardiogram which was requested by Dr. Yetta Barre.   3. Paroxysmal atrial fibrillation: He is maintaining in sinus rhythm.  He is on long-term anticoagulation with warfarin with no bleeding complications.  His INR has been therapeutic.  4. Coronary artery disease involving native coronary arteries without angina:  status post one-vessel CABG and VSD repair. Continue medical therapy.  5. Diabetes mellitus: Improved from before.  6. Hyperlipidemia: Recent lipid profile showed an LDL of 54.  Continue rosuvastatin.   Disposition:   Follow-up in 3 months.  Signed,  Lorine Bears, MD  08/20/2023 9:32 AM    [facility]

## 2023-08-20 NOTE — Patient Instructions (Signed)
continue taking 1.5 tablets daily except for 1 tablet every Mondays, Wednesdays and Fridays. Recheck INR in 5 weeks.  Stay [facility] with [facility] [phone]

## 2023-08-20 NOTE — Patient Instructions (Signed)
Medication Instructions:  No changes *If you need a refill on your cardiac medications before your next appointment, please call your pharmacy*   Lab Work: None ordered If you have labs (blood work) drawn today and your tests are completely normal, you will receive your results only by: MyChart Message (if you have MyChart) OR A paper copy in the mail If you have any lab test that is abnormal or we need to change your treatment, we will call you to review the results.   Testing/Procedures: None ordered   Follow-Up: At [facility], you and your health needs are our priority.  As part of our continuing mission to provide you with exceptional heart care, we have created designated Provider Care Teams.  These Care Teams include your primary Cardiologist (physician) and Advanced Practice Providers (APPs -  Physician Assistants and Nurse Practitioners) who all work together to provide you with the care you need, when you need it.  We recommend signing up for the patient portal called "MyChart".  Sign up information is provided on this After Visit Summary.  MyChart is used to connect with patients for Virtual Visits (Telemedicine).  Patients are able to view lab/test results, encounter notes, upcoming appointments, etc.  Non-urgent messages can be sent to your provider as well.   To learn more about what you can do with MyChart, go to [email or web address].    Your next appointment:   3 month(s)  Provider:   Lorine Bears, MD     Other Instructions A referral has been placed to the [facility]. They will call you to set up an appointment

## 2023-08-21 ENCOUNTER — Other Ambulatory Visit (HOSPITAL_COMMUNITY): Payer: 59

## 2023-08-22 MED ORDER — LANTUS SOLOSTAR 100 UNIT/ML ~~LOC~~ SOPN: 40.0000 [IU] | PEN_INJECTOR | Freq: Every day | SUBCUTANEOUS | 1 refills | Status: DC

## 2023-08-22 MED ORDER — ICOSAPENT ETHYL 1 G PO CAPS
2.0000 g | ORAL_CAPSULE | Freq: Two times a day (BID) | ORAL | 1 refills | Status: DC
  Filled 2024-02-18: qty 100, 25d supply, fill #0

## 2023-08-22 MED ORDER — ROSUVASTATIN CALCIUM 40 MG PO TABS: 40.0000 mg | ORAL_TABLET | Freq: Every day | ORAL | 1 refills | Status: DC

## 2023-08-23 ENCOUNTER — Other Ambulatory Visit: Payer: Self-pay | Admitting: Internal Medicine

## 2023-08-23 DIAGNOSIS — G2581 Restless legs syndrome: Secondary | ICD-10-CM

## 2023-08-26 DIAGNOSIS — Z7901 Long term (current) use of anticoagulants: Secondary | ICD-10-CM | POA: Diagnosis not present

## 2023-08-26 DIAGNOSIS — R3121 Asymptomatic microscopic hematuria: Secondary | ICD-10-CM | POA: Diagnosis not present

## 2023-08-27 ENCOUNTER — Other Ambulatory Visit: Payer: Self-pay | Admitting: Internal Medicine

## 2023-08-27 DIAGNOSIS — K219 Gastro-esophageal reflux disease without esophagitis: Secondary | ICD-10-CM

## 2023-09-03 ENCOUNTER — Other Ambulatory Visit: Payer: Self-pay | Admitting: Cardiovascular Disease

## 2023-09-03 NOTE — Telephone Encounter (Signed)
 Refill Request.

## 2023-09-04 ENCOUNTER — Other Ambulatory Visit (INDEPENDENT_AMBULATORY_CARE_PROVIDER_SITE_OTHER): Payer: 59 | Admitting: Pharmacist

## 2023-09-04 DIAGNOSIS — E1122 Type 2 diabetes mellitus with diabetic chronic kidney disease: Secondary | ICD-10-CM

## 2023-09-04 NOTE — Progress Notes (Signed)
 09/06/2023 Name: William [name] MRN: [id number] DOB: May 25, 1951  Chief Complaint  Patient presents with   Diabetes   Medication Management   Congestive Heart Failure    William [name] is a 73 y.o. year old male who presented for a telephone visit.   They were referred to the pharmacist by their PCP for assistance in managing diabetes and CHF .   *Jaz - f/u about needing cardio f/u after echo - has not heard from cardio following referral Still Humalog PRN - recommended stopping    Needs new Gv[place]  Subjective:  Care Team: Primary Care Provider: [name], MD ; Next Scheduled Visit: none scheduled   Medication Access/Adherence  Current Pharmacy:  [id number] Pura Spice, [place] - [address] AT [facility] [address] [place] [address] Phone: [phone] Fax: [phone]   Patient reports affordability concerns with their medications: No  Patient reports access/transportation concerns to their pharmacy: No  Patient reports adherence concerns with their medications:  No     Diabetes:  Current medications: Farxiga 10 mg daily, Lantus 40 units daily, Ozempic 0.25 mg weekly. Daughter notes he still sometimes uses Humalog prior to meals on sliding scale Medications tried in the past:   Current glucose readings: BG 100s on Dexcom, has had down to 65  Patient denies hypoglycemic s/sx including dizziness, shakiness, sweating.    Heart Failure (EF 40-45%):  Current medications:  ACEi/ARB/ARNI: none SGLT2i: Farxiga Beta blocker: Carvedilol Mineralocorticoid Receptor Antagonist: none Diuretic regimen: Torsemide 20 mg daily  Notes they have not heard from cardio to schedule appt for HF  Patient denies volume overload signs or symptoms including shortness of breath, lower extremity edema, increased use of pillows at night   Objective:  Lab Results  Component Value Date   HGBA1C 6.5 08/19/2023    Lab Results   Component Value Date   CREATININE 1.59 (H) 08/19/2023   BUN 34 (H) 08/19/2023   NA 142 08/19/2023   K 4.8 08/19/2023   CL 105 08/19/2023   CO2 28 08/19/2023    Lab Results  Component Value Date   CHOL 106 08/19/2023   HDL 26.10 (L) 08/19/2023   LDLCALC 20 08/19/2023   LDLDIRECT 54.0 11/05/2022   TRIG 296.0 (H) 08/19/2023   CHOLHDL 4 08/19/2023    Medications Reviewed Today     Reviewed by Bonita Quin, RPH (Pharmacist) on 09/06/23 at 1634  Med List Status: <None>   Medication Order Taking? Sig Documenting Provider Last Dose Status Informant  acetaminophen (TYLENOL) 500 MG tablet [id number]  Take 500 mg by mouth every 6 (six) hours as needed for moderate pain. [name], MD  Active Child           Med Note Tresa Garter D   Wed Jun 05, 2023  7:55 AM)    Blood Glucose Monitoring Suppl DEVI [id number]  1 each by Does not apply route in the morning, at noon, and at bedtime. May substitute to any manufacturer covered by patient's insurance. [name], MD  Active   carvedilol (COREG) 3.125 MG tablet [id number] Yes TAKE 1 TABLET(3.125 MG) BY MOUTH TWICE DAILY WITH A MEAL [name], MD Taking Active Child  clopidogrel (PLAVIX) 75 MG tablet [id number] Yes Take 1 tablet (75 mg total) by mouth daily. Iran Ouch, MD Taking Active   Continuous Glucose Receiver Vira Agar G7 [place]) [place] [id number] Yes 1 Act by Does not apply  route daily. [name], MD Taking Active Child  Continuous Glucose Sensor (DEXCOM G7 SENSOR) [place] [id number] Yes 1 Act by Does not apply route daily. [name], MD Taking Active Child  FARXIGA 10 MG TABS tablet [id number] Yes TAKE 1 TABLET(10 MG) BY MOUTH DAILY [name], MD Taking Active   gabapentin (NEURONTIN) 300 MG capsule [id number] Yes TAKE 1 CAPSULE(300 MG) BY MOUTH TWICE DAILY [name], MD Taking Active   icosapent Ethyl (VASCEPA) 1 g capsule [id number] Yes Take 2 capsules (2 g total) by mouth 2 (two) times  daily. [name], MD Taking Active   insulin glargine (LANTUS SOLOSTAR) 100 UNIT/ML Solostar Pen [id number] Yes Inject 40 Units into the skin at bedtime. [name], MD Taking Active   Insulin Pen Needle 31G X 8 MM MISC [id number]  1 each by Does not apply route 4 (four) times daily. [name], MD  Active Child  OZEMPIC, 0.25 OR 0.5 MG/DOSE, 2 MG/3ML SOPN [id number] Yes Inject 0.25 mg into the skin once a week. [provider] Taking Active Child           Med Note Christena Flake Jul 11, 2023  3:14 PM) Every thursday  pantoprazole (PROTONIX) 40 MG tablet [id number] Yes TAKE 1 TABLET(40 MG) BY MOUTH TWICE DAILY BEFORE A MEAL [name], MD Taking Active   pramipexole (MIRAPEX) 0.125 MG tablet [id number] Yes TAKE 1 TABLET(0.125 MG) BY MOUTH AT BEDTIME [name], MD Taking Active   rosuvastatin (CRESTOR) 40 MG tablet [id number] Yes Take 1 tablet (40 mg total) by mouth daily. [name], MD Taking Active   silver sulfADIAZINE (SILVADENE) 1 % cream [id number]  Apply 1 Application topically daily.  Patient taking differently: Apply 1 Application topically daily as needed (Wound).   Felecia Shelling, DPM  Active Child  torsemide (DEMADEX) 20 MG tablet [id number] Yes Take 1 tablet (20 mg total) by mouth every other day. TAKE 1 TABLET(20 MG) BY MOUTH 3 TIMES A WEEK Monday, Wednesday and Friday  Patient taking differently: Take 20 mg by mouth every Monday, Wednesday, and Friday.   [name], MD Taking Active Child  warfarin (COUMADIN) 5 MG tablet [id number] Yes TAKE 1 TO 1 AND 1/2 TABLETS BY MOUTH DAILY AS DIRECTED BY COUMADIN CLINIC Iran Ouch, MD Taking Active               Assessment/Plan:   Diabetes: - Currently controlled, A1c goal <8% - Reviewed goal A1c, goal fasting, and goal 2 hour post prandial glucose - Recommend to stop using Humalog SSI. Continue regimen and monitor for hypoglycemia.  - Recommend to check glucose using  Dexcom   Heart Failure: - Currently appropriately managed - limited due to hypotension - Recommend to continue current regimen. Waiting to be scheduled with cardio     Follow Up Plan: 3/27  Arbutus Leas, PharmD, BCPS, CPP Clinical Pharmacist Practitioner [facility] [phone]

## 2023-09-04 NOTE — Progress Notes (Signed)
 [facility] - Clinic Note  09/16/2023    CHIEF COMPLAINT Patient presents for Retina Follow Up  HISTORY OF PRESENT ILLNESS: William [name] is a 73 y.o. male who presents to the clinic today for:   HPI     Retina Follow Up   Patient presents with  Diabetic Retinopathy.  In both eyes.  This started 6 weeks ago.  I, the attending physician,  performed the HPI with the patient and updated documentation appropriately.        Comments   Patient here for 6 weeks retina follow up for NPDR OU. Patient states vision doing good. No eye pain.       Last edited by Rennis Chris, MD on 09/16/2023  8:16 AM.    Pt states vision is stable, his blood pressure and blood sugar have been doing well, kidneys are doing okay, he has been taken, he has taken off his daytime insulin and only uses it at night, he also takes ozempic on thursdays  Referring physician: [name], MD [address] [place],  [place] [address]  HISTORICAL INFORMATION:  Selected notes from the MEDICAL RECORD NUMBER Referred by Dr. Alben Spittle for eval of DME OU LEE:  Ocular Hx- PMH-    CURRENT MEDICATIONS: No current outpatient medications on file. (Ophthalmic Drugs)   No current facility-administered medications for this visit. (Ophthalmic Drugs)   Current Outpatient Medications (Other)  Medication Sig   acetaminophen (TYLENOL) 500 MG tablet Take 500 mg by mouth every 6 (six) hours as needed for moderate pain.   Blood Glucose Monitoring Suppl DEVI 1 each by Does not apply route in the morning, at noon, and at bedtime. May substitute to any manufacturer covered by patient's insurance.   carvedilol (COREG) 3.125 MG tablet TAKE 1 TABLET(3.125 MG) BY MOUTH TWICE DAILY WITH A MEAL   clopidogrel (PLAVIX) 75 MG tablet Take 1 tablet (75 mg total) by mouth daily.   Continuous Glucose Receiver (DEXCOM G7 RECEIVER) DEVI 1 Act by Does not apply route daily.   Continuous Glucose Sensor (DEXCOM G7 SENSOR) MISC  1 Act by Does not apply route daily.   FARXIGA 10 MG TABS tablet TAKE 1 TABLET(10 MG) BY MOUTH DAILY   gabapentin (NEURONTIN) 300 MG capsule TAKE 1 CAPSULE(300 MG) BY MOUTH TWICE DAILY   icosapent Ethyl (VASCEPA) 1 g capsule Take 2 capsules (2 g total) by mouth 2 (two) times daily.   insulin glargine (LANTUS SOLOSTAR) 100 UNIT/ML Solostar Pen Inject 40 Units into the skin at bedtime.   Insulin Pen Needle 31G X 8 MM MISC 1 each by Does not apply route 4 (four) times daily.   OZEMPIC, 0.25 OR 0.5 MG/DOSE, 2 MG/3ML SOPN Inject 0.25 mg into the skin once a week.   pantoprazole (PROTONIX) 40 MG tablet TAKE 1 TABLET(40 MG) BY MOUTH TWICE DAILY BEFORE A MEAL   pramipexole (MIRAPEX) 0.125 MG tablet TAKE 1 TABLET(0.125 MG) BY MOUTH AT BEDTIME   rosuvastatin (CRESTOR) 40 MG tablet Take 1 tablet (40 mg total) by mouth daily.   silver sulfADIAZINE (SILVADENE) 1 % cream Apply 1 Application topically daily. (Patient taking differently: Apply 1 Application topically daily as needed (Wound).)   torsemide (DEMADEX) 20 MG tablet Take 1 tablet (20 mg total) by mouth every other day. TAKE 1 TABLET(20 MG) BY MOUTH 3 TIMES A WEEK Monday, Wednesday and Friday (Patient taking differently: Take 20 mg by mouth every Monday, Wednesday, and Friday.)   warfarin (COUMADIN) 5  MG tablet TAKE 1 TO 1 AND 1/2 TABLETS BY MOUTH DAILY AS DIRECTED BY COUMADIN CLINIC   No current facility-administered medications for this visit. (Other)   REVIEW OF SYSTEMS: ROS   Positive for: Gastrointestinal, Genitourinary, Endocrine, Eyes Negative for: Constitutional, Neurological, Skin, Musculoskeletal, HENT, Cardiovascular, Respiratory, Psychiatric, Allergic/Imm, Heme/Lymph Last edited by Laddie Aquas, COA on 09/16/2023  8:05 AM.     ALLERGIES Allergies  Allergen Reactions   Morphine And Codeine Shortness Of Breath and Other (See Comments)    UNSPECIFIED REACTION "Pt said it was too much"    Latex Rash   PAST MEDICAL HISTORY Past  Medical History:  Diagnosis Date   AKI (acute kidney injury) ([name])    With STEMI in 2017   Chronic systolic CHF (congestive heart failure) ([name])    Depression    Diabetes mellitus without complication ([name])    Diabetic retinopathy ([name])    GERD (gastroesophageal reflux disease)    Hx of adenomatous colonic polyps 04/07/2018   Hyperlipidemia    Hypertension    Hypertensive retinopathy    Paroxysmal atrial fibrillation ([name])    Peripheral vascular disease ([name])    Pneumonia    Seizures ([name])    hx of as a child   STEMI (ST elevation myocardial infarction) ([name]) 2017   Past Surgical History:  Procedure Laterality Date   ABDOMINAL AORTOGRAM W/LOWER EXTREMITY N/A 09/19/2016   Procedure: Abdominal Aortogram w/Lower Extremity;  Surgeon: Iran Ouch, MD;  Location: [facility];  Service: Cardiovascular;  Laterality: N/A;   ABDOMINAL AORTOGRAM W/LOWER EXTREMITY N/A 07/17/2023   Procedure: ABDOMINAL AORTOGRAM W/LOWER EXTREMITY;  Surgeon: Iran Ouch, MD;  Location: [facility];  Service: Cardiovascular;  Laterality: N/A;   AMPUTATION Right 03/23/2016   Procedure: 1st and 2nd Ray Amputation Right Foot;  Surgeon: Nadara Mustard, MD;  Location: [facility] OR;  Service: Orthopedics;  Laterality: Right;   AMPUTATION Right 06/21/2016   Procedure: RIGHT TRANSMETATARSAL AMPUTATION;  Surgeon: Nadara Mustard, MD;  Location: [facility] OR;  Service: Orthopedics;  Laterality: Right;   CARDIAC CATHETERIZATION N/A 03/13/2016   Procedure: Right/Left Heart Cath and Coronary Angiography;  Surgeon: Tonny Bollman, MD;  Location: [facility] INVASIVE CV LAB;  Service: Cardiovascular;  Laterality: N/A;   CARDIAC CATHETERIZATION N/A 03/13/2016   Procedure: IABP Insertion;  Surgeon: Tonny Bollman, MD;  Location: [facility] INVASIVE CV LAB;  Service: Cardiovascular;  Laterality: N/A;   CATARACT EXTRACTION     CATARACT EXTRACTION, BILATERAL     CERVICAL FUSION  1982, 1992   has had 3 neck surgeries from breaking his neck    CIRCUMCISION N/A 11/07/2021   Procedure: CIRCUMCISION ADULT;  Surgeon: Despina Arias, MD;  Location: [facility] ORS;  Service: Urology;  Laterality: N/A;   CORONARY ARTERY BYPASS GRAFT N/A 03/13/2016   Procedure: CORONARY ARTERY BYPASS GRAFTING (CABG) x 1 (SVG to OM) with EVH from LEFT GREATER SAPHENOUS VEIN;  Surgeon: Kerin Perna, MD;  Location: [facility] OR;  Service: Open Heart Surgery;  Laterality: N/A;   EYE SURGERY     LOWER EXTREMITY ANGIOGRAM  05/02/2016   Procedure: Lower Extremity Angiogram;  Surgeon: Iran Ouch, MD;  Location: [facility];  Service: Cardiovascular;;  Limited left femoral runoff right femoral runoff   PERIPHERAL VASCULAR BALLOON ANGIOPLASTY  07/17/2023   Procedure: PERIPHERAL VASCULAR BALLOON ANGIOPLASTY;  Surgeon: Iran Ouch, MD;  Location: [facility];  Service: Cardiovascular;;   PERIPHERAL VASCULAR CATHETERIZATION N/A 05/02/2016  Procedure: Abdominal Aortogram;  Surgeon: Iran Ouch, MD;  Location: [facility];  Service: Cardiovascular;  Laterality: N/A;   PERIPHERAL VASCULAR CATHETERIZATION Right 05/02/2016   Procedure: Peripheral Vascular Balloon Angioplasty;  Surgeon: Iran Ouch, MD;  Location: [facility];  Service: Cardiovascular;  Laterality: Right;  SFA   TEE WITHOUT CARDIOVERSION N/A 03/13/2016   Procedure: TRANSESOPHAGEAL ECHOCARDIOGRAM (TEE);  Surgeon: Kerin Perna, MD;  Location: [facility] OR;  Service: Open Heart Surgery;  Laterality: N/A;   VSD REPAIR N/A 03/13/2016   Procedure: VENTRICULAR SEPTAL DEFECT (VSD) REPAIR;  Surgeon: Kerin Perna, MD;  Location: [facility] OR;  Service: Open Heart Surgery;  Laterality: N/A;   FAMILY HISTORY Family History  Problem Relation Age of Onset   Diabetes Maternal Grandmother    Diabetes Mother    Aneurysm Mother    Peripheral Artery Disease Mother    Coronary artery disease Mother    Peptic Ulcer Father    Retinoblastoma Daughter    Colon cancer Neg Hx    Rectal cancer  Neg Hx    SOCIAL HISTORY Social History   Tobacco Use   Smoking status: Former    Current packs/day: 0.00    Types: Cigarettes    Quit date: 11/20/2015    Years since quitting: 7.8   Smokeless tobacco: Never   Tobacco comments:    quit 2018  Vaping Use   Vaping status: Never Used  Substance Use Topics   Alcohol use: No   Drug use: No       OPHTHALMIC EXAM: Base Eye Exam     Visual Acuity (Snellen - Linear)       Right Left   Dist [place] 20/25 20/25         Tonometry (Tonopen, 8:03 AM)       Right Left   Pressure 09 08         Pupils       Dark Light Shape React APD   Right 2 1 Round Brisk None   Left 2 1 Round Brisk None         Visual Fields (Counting fingers)       Left Right    Full Full         Extraocular Movement       Right Left    Full, Ortho Full, Ortho         Neuro/Psych     Oriented x3: Yes   Mood/Affect: Normal         Dilation     Both eyes: 1.0% Mydriacyl, 2.5% Phenylephrine @ 8:03 AM           Slit Lamp and Fundus Exam     Slit Lamp Exam       Right Left   Lids/Lashes Dermatochalasis - upper lid, mild MGD Dermatochalasis - upper lid, mild MGD   Conjunctiva/Sclera White and quiet White and quiet   Cornea trace PEE, well healed cataract wound, mild tear film debris 1+ fine PEE, mild tear film debris, well healed cataract wound   Anterior Chamber Deep and quiet Deep and quiet   Iris Round and dilated, No NVI Round and dilated, No NVI   Lens PC IOL in good position PC IOL in good position   Anterior Vitreous Vitreous syneresis, vitreous condensations Vitreous syneresis, Posterior vitreous detachment, vitreous condensations         Fundus Exam       Right Left   Disc trace  Pallor, Sharp rim mild Pallor, Sharp rim, mild tilt   C/D Ratio 0.3 0.3   Macula Flat, good foveal reflex, scatted Microaneurysms, cystic changes superior macula -- improved, good focal laser changes Flat, good foveal reflex, scattered MA  -- stably improved, stable improvement in cystic changes temporal mac, good focal laser changes   Vessels attenuated, mild tortuosity attenuated, Tortuous   Periphery Attached, +MA greatest posteriorly Attached, scattered MA greatest posteriorly           IMAGING AND PROCEDURES  Imaging and Procedures for 09/16/2023  OCT, Retina - OU - Both Eyes       Right Eye Quality was good. Central Foveal Thickness: 255. Progression has improved. Findings include no SRF, abnormal foveal contour, intraretinal hyper-reflective material, intraretinal fluid, vitreomacular adhesion (Interval improvement in IRF/cystic changes superior fovea and mac ).   Left Eye Quality was good. Central Foveal Thickness: 251. Progression has been stable. Findings include normal foveal contour, no SRF, intraretinal hyper-reflective material, intraretinal fluid (Stable improvement in IRF/ cystic changes temporal macula -- trace cystic changes remain).   Notes *Images captured and stored on drive  Diagnosis / Impression:  +DME OU OD: Interval improvement in IRF/cystic changes superior fovea and mac   OS: Stable improvement in IRF/ cystic changes temporal macula -- trace cystic changes remain  Clinical management:  See below  Abbreviations: NFP - Normal foveal profile. CME - cystoid macular edema. PED - pigment epithelial detachment. IRF - intraretinal fluid. SRF - subretinal fluid. EZ - ellipsoid zone. ERM - epiretinal membrane. ORA - outer retinal atrophy. ORT - outer retinal tubulation. SRHM - subretinal hyper-reflective material. IRHM - intraretinal hyper-reflective material            ASSESSMENT/PLAN:    ICD-10-CM   1. Moderate nonproliferative diabetic retinopathy of both eyes with macular edema associated with type 2 diabetes mellitus ([name])  E11.3313 OCT, Retina - OU - Both Eyes    2. Current use of insulin ([name])  Z79.4     3. Long-term (current) use of injectable non-insulin antidiabetic drugs   Z79.85     4. Essential hypertension  I10     5. Hypertensive retinopathy of both eyes  H35.033     6. Pseudophakia, both eyes  Z96.1      1-3. Moderate non-proliferative diabetic retinopathy, both eyes  - last A1c was 6.5 on 02.17.25  - s/p IVA OD #1 (11.14.22), #2 (12.12.22), #3 (01.09.23) -- IVA resistance - s/p IVA OS #1 (10.17.22), #2 (11.14.22), #3 (12.12.22), #4 (01.09.23), #5 (03.15.23), #6 (04.12.23) -- IVA resistance - s/p IVE OD #1 (03.15.23), #2 (04.12.23), #3 (05.10.23), #4 (06.07.23), #5 (07.05.23), #6 (08.02.23), #7 (08.31.23), #8 (09.28.23), #9 (10.30.23), #10 (11.28.23), #11 (12.28.23), #12 (01.29.24), #13 (02.20.24), #14 (03.25.24), #15 (04.22.24), #16 (05.20.24), #17 (06.17.24), #18 (07.22.24), #19 (08.26.24), #20 (09.30.24), #21 (11.04.24), #22 (12.16.24) - s/p IVE OS #1 (05.10.23), #2 (06.07.23), #3 (07.05.23), #4 (08.02.23), #5 (08.31.23), #6 (09.28.23), #7 (10.30.23), #8 (11.28.23), #9 (12.28.23), #10 (01.29.24), #11 (02.20..24), #12 (03.25.24), #13 (04.22.24), #14 (05.20.24), #15 (06.17.24), #16 (07.22.24), #17 (09.30.24), #18 (11.04.24), #19 (12.16.24) - s/p focal laser OS (06.25.24) - s/p focal laser OD (01.13.25) - exam shows scattered MA, DBH OU - FA (10.17.22) shows late leaking MA OU, no NV OU - OCT shows OD: Interval improvement in IRF/cystic changes superior fovea and mac; OS: Stable improvement in IRF/ cystic changes temporal macula -- trace cystic changes remain at 3 mos since last injections - BCVA 20/25 OU -- stable -  recommend holding injections today - pt in agreement - IVE informed consent obtained and signed, 11.04.24 (OU) - f/u 3 months, sooner prn -- DFE, OCT, possible injection(s)  4,5. Hypertensive retinopathy OU - discussed importance of tight BP control - monitor  6. Pseudophakia OU  - s/p CE/IOL OU  - IOL in good position, doing well  - monitor  Ophthalmic Meds Ordered this visit:  No orders of the defined types were placed in this  encounter.    Return in about 3 months (around 12/17/2023) for f/u NPDR OU, DFE, OCT.  There are no Patient Instructions on file for this visit.  Explained the diagnoses, plan, and follow up with the patient and they expressed understanding.  Patient expressed understanding of the importance of proper follow up care.   This document serves as a record of services personally performed by Karie Chimera, MD, PhD. It was created on their behalf by Glee Arvin. Manson Passey, OA an ophthalmic technician. The creation of this record is the provider's dictation and/or activities during the visit.    Electronically signed by: Glee Arvin. Manson Passey, OA 09/19/23 10:37 PM  Karie Chimera, M.D., Ph.D. Diseases & Surgery of the Retina and Vitreous [facility]  I have reviewed the above documentation for accuracy and completeness, and I agree with the above. Karie Chimera, M.D., Ph.D. 09/19/23 10:38 PM   Abbreviations: M myopia (nearsighted); A astigmatism; H hyperopia (farsighted); P presbyopia; Mrx spectacle prescription;  CTL contact lenses; OD right eye; OS left eye; OU both eyes  XT exotropia; ET esotropia; PEK punctate epithelial keratitis; PEE punctate epithelial erosions; DES dry eye syndrome; MGD meibomian gland dysfunction; ATs artificial tears; PFAT's preservative free artificial tears; NSC nuclear sclerotic cataract; PSC posterior subcapsular cataract; ERM epi-retinal membrane; PVD posterior vitreous detachment; RD retinal detachment; DM diabetes mellitus; DR diabetic retinopathy; NPDR non-proliferative diabetic retinopathy; PDR proliferative diabetic retinopathy; CSME clinically significant macular edema; DME diabetic macular edema; dbh dot blot hemorrhages; CWS cotton wool spot; POAG primary open angle glaucoma; C/D cup-to-disc ratio; HVF humphrey visual field; GVF goldmann visual field; OCT optical coherence tomography; IOP intraocular pressure; BRVO Branch retinal vein occlusion;  CRVO central retinal vein occlusion; CRAO central retinal artery occlusion; BRAO branch retinal artery occlusion; RT retinal tear; SB scleral buckle; PPV pars plana vitrectomy; VH Vitreous hemorrhage; PRP panretinal laser photocoagulation; IVK intravitreal kenalog; VMT vitreomacular traction; MH Macular hole;  NVD neovascularization of the disc; NVE neovascularization elsewhere; AREDS age related eye disease study; ARMD age related macular degeneration; POAG primary open angle glaucoma; EBMD epithelial/anterior basement membrane dystrophy; ACIOL anterior chamber intraocular lens; IOL intraocular lens; PCIOL posterior chamber intraocular lens; Phaco/IOL phacoemulsification with intraocular lens placement; PRK photorefractive keratectomy; LASIK laser assisted in situ keratomileusis; HTN hypertension; DM diabetes mellitus; COPD chronic obstructive pulmonary disease

## 2023-09-06 NOTE — Patient Instructions (Signed)
 It was a pleasure speaking with you today!  Stop using Humalog and continue Ozempic, Lantus and Farxiga for blood pressure.  How to treat low blood sugar:  - For blood sugar less than 70: Treat with 4 ounces of juice or regular soda, or with 3 to 4 glucose tablets.  - Re-check blood sugar in 15 minutes. ?  -If blood sugar is still less than 70 on re-check, treat again and re-check in 15 minutes - Once blood sugar is back above 70, eat a balanced meal or snack to avoid blood sugar dropping again   Feel free to call with any questions or concerns!  Arbutus Leas, PharmD, BCPS, CPP Clinical Pharmacist Practitioner [place] [facility] [phone]

## 2023-09-09 ENCOUNTER — Telehealth: Payer: Self-pay | Admitting: Cardiovascular Disease

## 2023-09-09 NOTE — Telephone Encounter (Signed)
 Wife is returning call to discuss echo results + he would like yo know if patient needs an appointment to discuss.

## 2023-09-09 NOTE — Telephone Encounter (Signed)
 Spoke with pt daughter, Elon Jester, she reports the patient recently saw dr Kirke Corin and dr Jonny Ruiz did an echo and said he needed to be seen. She is wanting dr Kirke Corin to review the echo and then let her know if an appointment is needed. Aware will forward to dr Kirke Corin. They are usually seen in [place].

## 2023-09-10 ENCOUNTER — Other Ambulatory Visit: Payer: Self-pay | Admitting: Cardiovascular Disease

## 2023-09-10 NOTE — Telephone Encounter (Signed)
 Refill request for warfarin:  Last INR was 2.2 on 08/20/23 Next INR due 09/23/23 LOV was 08/20/23  Refill approved.

## 2023-09-10 NOTE — Telephone Encounter (Signed)
 Refill request

## 2023-09-16 ENCOUNTER — Ambulatory Visit (INDEPENDENT_AMBULATORY_CARE_PROVIDER_SITE_OTHER): Payer: 59 | Admitting: Ophthalmology

## 2023-09-16 ENCOUNTER — Encounter (INDEPENDENT_AMBULATORY_CARE_PROVIDER_SITE_OTHER): Payer: Self-pay | Admitting: Ophthalmology

## 2023-09-16 DIAGNOSIS — Z794 Long term (current) use of insulin: Secondary | ICD-10-CM | POA: Diagnosis not present

## 2023-09-16 DIAGNOSIS — H35033 Hypertensive retinopathy, bilateral: Secondary | ICD-10-CM | POA: Diagnosis not present

## 2023-09-16 DIAGNOSIS — Z7985 Long-term (current) use of injectable non-insulin antidiabetic drugs: Secondary | ICD-10-CM | POA: Diagnosis not present

## 2023-09-16 DIAGNOSIS — E113313 Type 2 diabetes mellitus with moderate nonproliferative diabetic retinopathy with macular edema, bilateral: Secondary | ICD-10-CM | POA: Diagnosis not present

## 2023-09-16 DIAGNOSIS — I10 Essential (primary) hypertension: Secondary | ICD-10-CM

## 2023-09-16 DIAGNOSIS — Z961 Presence of intraocular lens: Secondary | ICD-10-CM

## 2023-09-20 NOTE — Telephone Encounter (Signed)
The patient's daughter has been made aware.

## 2023-09-20 NOTE — Telephone Encounter (Signed)
 I reviewed his echo.  It seems to be only slightly changed from before.  Continue same medications for now and follow-up with me in few months.

## 2023-09-23 ENCOUNTER — Ambulatory Visit: Payer: 59 | Attending: Cardiovascular Disease | Admitting: *Deleted

## 2023-09-23 DIAGNOSIS — Z5181 Encounter for therapeutic drug level monitoring: Secondary | ICD-10-CM

## 2023-09-23 DIAGNOSIS — I480 Paroxysmal atrial fibrillation: Secondary | ICD-10-CM

## 2023-09-23 LAB — POCT INR: POC INR: 2.3

## 2023-09-23 NOTE — Patient Instructions (Signed)
 Description    Continue taking 1.5 tablets daily except for 1 tablet every Mondays, Wednesdays and Fridays. Recheck INR in 6 weeks.  Stay [facility] with [facility] [phone]      1st Floor: - Lobby - Registration  - Pharmacy  - Lab - Cafe  2nd Floor: - PV Lab - Diagnostic Testing (echo, CT, nuclear med)  3rd Floor: - Vacant  4th Floor: - TCTS (cardiothoracic surgery) - AFib Clinic - Structural Heart Clinic - Vascular Surgery  - Vascular Ultrasound  5th Floor: - [facility] (general and EP) - Clinical Pharmacy for coumadin, hypertension, lipid, weight-loss medications, and med management appointments    Valet parking services will be available as well.

## 2023-09-25 NOTE — Progress Notes (Deleted)
 09/25/2023 Name: William [name] MRN: [id number] DOB: 06-Aug-1950  No chief complaint on file.   William [name] is a 73 y.o. year old male who presented for a telephone visit.   They were referred to the pharmacist by their PCP for assistance in managing diabetes and CHF .   *Jaz - f/u about needing cardio f/u after echo - has not heard from cardio following referral Still Humalog  PRN - recommended stopping    Needs new Gvoke  Subjective:  Care Team: Primary Care Provider: Arcadio Knuckles, MD ; Next Scheduled Visit: none scheduled Cardiologist Provider: Wenona Hamilton, MD; Next Scheduled Visit: 11/19/2023   Medication Access/Adherence  Current Pharmacy:  [id number] [place], [place] - [address] AT [facility] OF [address] [place] [address] Phone: [phone] Fax: [phone]   Patient reports affordability concerns with their medications: No  Patient reports access/transportation concerns to their pharmacy: No  Patient reports adherence concerns with their medications:  No    Refill for ozempic??, torsemide  Diabetes: Still using humalog  for sliding scale before meals ? - askeed to stop from last appt  A1C = 6.5 At home BGs?  Has dexcom - ask for average  Highs Lows *** Typical am lows   Hyperglycemia sx (nocturia, neuropathy, visual changes, foot exams) Hypoglycemia symptoms (dizziness, shaky, sweating, hungry, confusion)******  Diet Exercise  Current medications: Farxiga  10 mg daily, Lantus  40 units daily, Ozempic 0.25 mg weekly. Daughter notes he still sometimes uses Humalog  prior to meals on sliding scale Medications tried in the past:   Current glucose readings: BG 100s on Dexcom, has had down to 65  Patient denies hypoglycemic s/sx including dizziness, shakiness, sweating.    Heart Failure (EF 40-45%):  Per Dr. Alvenia Aus, most recent echo results 2/18 slightly changed from prior. Recommend to continue with  same medications and follow up in a few months  - pt daughter made aware  Dizziness, headaches, SOB, blurred vision? History of swelling? Weight changes? -salt, fluid restrictions (no more than 2L a day) Home BP logs?   Cards appt 5/20  Current medications:  ACEi/ARB/ARNI: none SGLT2i: Farxiga  Beta blocker: Carvedilol  Mineralocorticoid Receptor Antagonist: none Diuretic regimen: Torsemide  20 mg daily  Notes they have not heard from cardio to schedule appt for HF  Patient denies volume overload signs or symptoms including shortness of breath, lower extremity edema, increased use of pillows at night     Objective:  Lab Results  Component Value Date   HGBA1C 6.5 08/19/2023    Lab Results  Component Value Date   CREATININE 1.59 (H) 08/19/2023   BUN 34 (H) 08/19/2023   NA 142 08/19/2023   K 4.8 08/19/2023   CL 105 08/19/2023   CO2 28 08/19/2023    Lab Results  Component Value Date   CHOL 106 08/19/2023   HDL 26.10 (L) 08/19/2023   LDLCALC 20 08/19/2023   LDLDIRECT 54.0 11/05/2022   TRIG 296.0 (H) 08/19/2023   CHOLHDL 4 08/19/2023    Medications Reviewed Today   Medications were not reviewed in this encounter       Assessment/Plan:   Diabetes: - Currently controlled, A1c goal <8% - Reviewed goal A1c, goal fasting, and goal 2 hour post prandial glucose - Recommend to stop using Humalog  SSI. Continue regimen and monitor for hypoglycemia.  - Recommend to check glucose using Dexcom   Heart Failure: - Currently appropriately managed - limited due to hypotension - Recommend  to continue current regimen. Waiting to be scheduled with cardio     Follow Up Plan:   Harvest Lineman, PharmD PGY1 Pharmacy Resident    Rainelle Bur, PharmD, BCPS, CPP Clinical Pharmacist Practitioner [facility] [phone]

## 2023-09-26 ENCOUNTER — Other Ambulatory Visit

## 2023-09-27 ENCOUNTER — Other Ambulatory Visit (INDEPENDENT_AMBULATORY_CARE_PROVIDER_SITE_OTHER): Admitting: Pharmacist

## 2023-09-27 DIAGNOSIS — E1122 Type 2 diabetes mellitus with diabetic chronic kidney disease: Secondary | ICD-10-CM

## 2023-09-27 NOTE — Progress Notes (Signed)
   09/27/2023 Name: William [name] MRN: [id number] DOB: July 20, 1950  No chief complaint on file.   William [name] is a 73 y.o. year old male who presented for a telephone visit.   They were referred to the pharmacist by their PCP for assistance in managing diabetes and CHF .   Subjective:  Care Team: Primary Care Provider: [name], MD ; Next Scheduled Visit: none scheduled   Medication Access/Adherence  Current Pharmacy:  [id number] [place], [place] - [address] AT [facility] OF [address] [place] [address] Phone: [phone] Fax: [phone]   Patient reports affordability concerns with their medications: No  Patient reports access/transportation concerns to their pharmacy: No  Patient reports adherence concerns with their medications:  No     Diabetes:  Current medications: Farxiga 10 mg daily, Lantus 40 units daily, Ozempic 0.25 mg weekly.  Medications tried in the past:   Current glucose readings: BG 100- low 200s on Dexcom, has had down to 75  Patient denies hypoglycemic s/sx including dizziness, shakiness, sweating.    Heart Failure (EF 40-45%):  Current medications:  ACEi/ARB/ARNI: none SGLT2i: Farxiga Beta blocker: Carvedilol Mineralocorticoid Receptor Antagonist: none Diuretic regimen: Torsemide 20 mg daily   Patient denies volume overload signs or symptoms including shortness of breath, lower extremity edema, increased use of pillows at night   Objective:  Lab Results  Component Value Date   HGBA1C 6.5 08/19/2023    Lab Results  Component Value Date   CREATININE 1.59 (H) 08/19/2023   BUN 34 (H) 08/19/2023   NA 142 08/19/2023   K 4.8 08/19/2023   CL 105 08/19/2023   CO2 28 08/19/2023    Lab Results  Component Value Date   CHOL 106 08/19/2023   HDL 26.10 (L) 08/19/2023   LDLCALC 20 08/19/2023   LDLDIRECT 54.0 11/05/2022   TRIG 296.0 (H) 08/19/2023   CHOLHDL 4 08/19/2023     Medications Reviewed Today   Medications were not reviewed in this encounter       Assessment/Plan:   Diabetes: - Currently controlled, A1c goal <8% - Reviewed goal A1c, goal fasting, and goal 2 hour post prandial glucose - Continue regimen and monitor for hypoglycemia.  - Recommend to check glucose using Dexcom   Heart Failure: - Currently appropriately managed - limited due to hypotension - Recommend to continue current regimen. Upcoming cardio f/u 5/20    Follow Up Plan: PRN  Arbutus Leas, PharmD, BCPS, CPP Clinical Pharmacist Practitioner  Primary Care at [facility] [phone]

## 2023-09-27 NOTE — Patient Instructions (Signed)
 It was a pleasure speaking with you today!  Continue your current regimen.  Please let me know if you are having frequent low blood sugar readings.  Feel free to call with any questions or concerns!  Arbutus Leas, PharmD, BCPS, CPP Clinical Pharmacist Practitioner [place] [facility] [phone]

## 2023-10-01 ENCOUNTER — Emergency Department (HOSPITAL_COMMUNITY)

## 2023-10-01 ENCOUNTER — Other Ambulatory Visit (HOSPITAL_COMMUNITY)

## 2023-10-01 ENCOUNTER — Other Ambulatory Visit: Payer: Self-pay

## 2023-10-01 ENCOUNTER — Emergency Department (HOSPITAL_COMMUNITY)
Admission: EM | Admit: 2023-10-01 | Discharge: 2023-10-02 | Disposition: A | Discharge status: Home / Self Care | Attending: Emergency Medicine | Admitting: Emergency Medicine

## 2023-10-01 ENCOUNTER — Encounter (HOSPITAL_COMMUNITY): Payer: Self-pay

## 2023-10-01 DIAGNOSIS — H532 Diplopia: Secondary | ICD-10-CM | POA: Diagnosis not present

## 2023-10-01 DIAGNOSIS — H5500 Unspecified nystagmus: Secondary | ICD-10-CM | POA: Insufficient documentation

## 2023-10-01 DIAGNOSIS — R404 Transient alteration of awareness: Secondary | ICD-10-CM | POA: Diagnosis not present

## 2023-10-01 DIAGNOSIS — R270 Ataxia, unspecified: Secondary | ICD-10-CM | POA: Insufficient documentation

## 2023-10-01 DIAGNOSIS — I509 Heart failure, unspecified: Secondary | ICD-10-CM | POA: Insufficient documentation

## 2023-10-01 DIAGNOSIS — Z794 Long term (current) use of insulin: Secondary | ICD-10-CM | POA: Insufficient documentation

## 2023-10-01 DIAGNOSIS — R4182 Altered mental status, unspecified: Secondary | ICD-10-CM | POA: Diagnosis not present

## 2023-10-01 DIAGNOSIS — Z7901 Long term (current) use of anticoagulants: Secondary | ICD-10-CM | POA: Diagnosis not present

## 2023-10-01 DIAGNOSIS — A419 Sepsis, unspecified organism: Secondary | ICD-10-CM | POA: Diagnosis not present

## 2023-10-01 DIAGNOSIS — R6889 Other general symptoms and signs: Secondary | ICD-10-CM | POA: Diagnosis not present

## 2023-10-01 DIAGNOSIS — I110 Hypertensive heart disease with heart failure: Secondary | ICD-10-CM | POA: Insufficient documentation

## 2023-10-01 DIAGNOSIS — Z91040 Latex allergy status: Secondary | ICD-10-CM | POA: Insufficient documentation

## 2023-10-01 DIAGNOSIS — R29818 Other symptoms and signs involving the nervous system: Secondary | ICD-10-CM | POA: Diagnosis not present

## 2023-10-01 DIAGNOSIS — R42 Dizziness and giddiness: Secondary | ICD-10-CM | POA: Diagnosis not present

## 2023-10-01 DIAGNOSIS — R569 Unspecified convulsions: Secondary | ICD-10-CM | POA: Diagnosis not present

## 2023-10-01 DIAGNOSIS — I10 Essential (primary) hypertension: Secondary | ICD-10-CM | POA: Diagnosis not present

## 2023-10-01 DIAGNOSIS — Z7902 Long term (current) use of antithrombotics/antiplatelets: Secondary | ICD-10-CM | POA: Insufficient documentation

## 2023-10-01 DIAGNOSIS — R0689 Other abnormalities of breathing: Secondary | ICD-10-CM | POA: Diagnosis not present

## 2023-10-01 LAB — CBC WITH DIFFERENTIAL/PLATELET
Abs Immature Granulocytes: 0.02 10*3/uL (ref 0.00–0.07)
Basophils Absolute: 0 10*3/uL (ref 0.0–0.1)
Basophils Relative: 0 %
Eosinophils Absolute: 0.1 10*3/uL (ref 0.0–0.5)
Eosinophils Relative: 1 %
HCT: 38.3 % — ABNORMAL LOW (ref 39.0–52.0)
Hemoglobin: 12.8 g/dL — ABNORMAL LOW (ref 13.0–17.0)
Immature Granulocytes: 0 %
Lymphocytes Relative: 17 %
Lymphs Abs: 1 10*3/uL (ref 0.7–4.0)
MCH: 29.4 pg (ref 26.0–34.0)
MCHC: 33.4 g/dL (ref 30.0–36.0)
MCV: 87.8 fL (ref 80.0–100.0)
Monocytes Absolute: 0.4 10*3/uL (ref 0.1–1.0)
Monocytes Relative: 6 %
Neutro Abs: 4.3 10*3/uL (ref 1.7–7.7)
Neutrophils Relative %: 76 %
Platelets: 109 10*3/uL — ABNORMAL LOW (ref 150–400)
RBC: 4.36 MIL/uL (ref 4.22–5.81)
RDW: 13.4 % (ref 11.5–15.5)
WBC: 5.7 10*3/uL (ref 4.0–10.5)
nRBC: 0 % (ref 0.0–0.2)

## 2023-10-01 LAB — I-STAT CHEM 8, ED
BUN: 33 mg/dL — ABNORMAL HIGH (ref 8–23)
Calcium, Ion: 1.2 mmol/L (ref 1.15–1.40)
Chloride: 105 mmol/L (ref 98–111)
Creatinine, Ser: 1.6 mg/dL — ABNORMAL HIGH (ref 0.61–1.24)
Glucose, Bld: 122 mg/dL — ABNORMAL HIGH (ref 70–99)
HCT: 37 % — ABNORMAL LOW (ref 39.0–52.0)
Hemoglobin: 12.6 g/dL — ABNORMAL LOW (ref 13.0–17.0)
Potassium: 3.9 mmol/L (ref 3.5–5.1)
Sodium: 141 mmol/L (ref 135–145)
TCO2: 25 mmol/L (ref 22–32)

## 2023-10-01 LAB — COMPREHENSIVE METABOLIC PANEL WITH GFR
ALT: 28 U/L (ref 0–44)
AST: 25 U/L (ref 15–41)
Albumin: 3.8 g/dL (ref 3.5–5.0)
Alkaline Phosphatase: 55 U/L (ref 38–126)
Anion gap: 10 (ref 5–15)
BUN: 32 mg/dL — ABNORMAL HIGH (ref 8–23)
CO2: 22 mmol/L (ref 22–32)
Calcium: 9.2 mg/dL (ref 8.9–10.3)
Chloride: 108 mmol/L (ref 98–111)
Creatinine, Ser: 1.5 mg/dL — ABNORMAL HIGH (ref 0.61–1.24)
GFR, Estimated: 49 mL/min — ABNORMAL LOW (ref >60)
Glucose, Bld: 127 mg/dL — ABNORMAL HIGH (ref 70–99)
Potassium: 3.8 mmol/L (ref 3.5–5.1)
Sodium: 140 mmol/L (ref 135–145)
Total Bilirubin: 0.8 mg/dL (ref 0.0–1.2)
Total Protein: 5.8 g/dL — ABNORMAL LOW (ref 6.5–8.1)

## 2023-10-01 LAB — TSH: TSH: 0.655 u[IU]/mL (ref 0.350–4.500)

## 2023-10-01 LAB — ETHANOL: Alcohol, Ethyl (B): <10 mg/dL (ref <10)

## 2023-10-01 LAB — APTT: aPTT: 39 s — ABNORMAL HIGH (ref 24–36)

## 2023-10-01 LAB — PROTIME-INR
INR: 2 — ABNORMAL HIGH (ref 0.8–1.2)
Prothrombin Time: 23.1 s — ABNORMAL HIGH (ref 11.4–15.2)

## 2023-10-01 LAB — I-STAT CG4 LACTIC ACID, ED: Lactic Acid, Venous: 0.6 mmol/L (ref 0.5–1.9)

## 2023-10-01 NOTE — Progress Notes (Signed)
 EEG complete - results pending

## 2023-10-01 NOTE — Consult Note (Signed)
 NEUROLOGY CONSULT NOTE   Date of service: October 01, 2023 Patient Name: William [name] MRN:  [id number] DOB:  [birth year] Chief Complaint: "unresponsive" Requesting Provider: Heide Scales, *  History of Present Illness  William [name] is a 73 y.o. male with hx of hypertension, hyperlipidemia, paroxysmal A-fib on warfarin, seizures as a child, STEMI, diabetes, diabetic retinopathy, GERD, CHF, depression who presents to [facility], ED via EMS for evaluation.  Per chart review and EDP patient was found unresponsive and shaking when they arrived home at around 1600.  Patient states that he laid down at 1400 and when he woke up he could not get up and he laid back down. On exam by the EDP he was noted to have ataxia of bilateral upper extremities.  He also complains of some dizziness and diplopia with some blurriness.  CT head with no acute process  LKW: 1400 Modified rankin score: 3-Moderate disability-requires help but walks WITHOUT assistance IV Thrombolysis:  No outside window EVT: No LVO   NIHSS components Score: Comment  1a Level of Conscious 0[x]  1[]  2[]  3[]      1b LOC Questions 0[x]  1[]  2[]       1c LOC Commands 0[x]  1[]  2[]       2 Best Gaze 0[x]  1[]  2[]       3 Visual 0[x]  1[]  2[]  3[]      4 Facial Palsy 0[x]  1[]  2[]  3[]      5a Motor Arm - left 0[x]  1[]  2[]  3[]  4[]  UN[]    5b Motor Arm - Right 0[x]  1[]  2[]  3[]  4[]  UN[]    6a Motor Leg - Left 0[x]  1[]  2[]  3[]  4[]  UN[]    6b Motor Leg - Right 0[x]  1[]  2[]  3[]  4[]  UN[]    7 Limb Ataxia 0[]  1[]  2[x]  3[]  UN[]     8 Sensory 0[x]  1[]  2[]  UN[]      9 Best Language 0[x]  1[]  2[]  3[]      10 Dysarthria 0[x]  1[]  2[]  UN[]      11 Extinct. and Inattention 0[x]  1[]  2[]       TOTAL: 2      ROS  Comprehensive ROS performed and pertinent positives documented in HPI    Past History   Past Medical History:  Diagnosis Date   AKI (acute kidney injury) ([name])    With STEMI in 2017   Chronic systolic CHF (congestive heart failure) ([name])    Depression     Diabetes mellitus without complication ([name])    Diabetic retinopathy ([name])    GERD (gastroesophageal reflux disease)    Hx of adenomatous colonic polyps 04/07/2018   Hyperlipidemia    Hypertension    Hypertensive retinopathy    Paroxysmal atrial fibrillation ([name])    Peripheral vascular disease ([name])    Pneumonia    Seizures ([name])    hx of as a child   STEMI (ST elevation myocardial infarction) ([name]) 2017    Past Surgical History:  Procedure Laterality Date   ABDOMINAL AORTOGRAM W/LOWER EXTREMITY N/A 09/19/2016   Procedure: Abdominal Aortogram w/Lower Extremity;  Surgeon: Iran Ouch, MD;  Location: [facility];  Service: Cardiovascular;  Laterality: N/A;   ABDOMINAL AORTOGRAM W/LOWER EXTREMITY N/A 07/17/2023   Procedure: ABDOMINAL AORTOGRAM W/LOWER EXTREMITY;  Surgeon: Iran Ouch, MD;  Location: [facility];  Service: Cardiovascular;  Laterality: N/A;   AMPUTATION Right 03/23/2016   Procedure: 1st and 2nd Ray Amputation Right Foot;  Surgeon: Nadara Mustard, MD;  Location: [facility] OR;  Service: Orthopedics;  Laterality: Right;  AMPUTATION Right 06/21/2016   Procedure: RIGHT TRANSMETATARSAL AMPUTATION;  Surgeon: Nadara Mustard, MD;  Location: [facility] OR;  Service: Orthopedics;  Laterality: Right;   CARDIAC CATHETERIZATION N/A 03/13/2016   Procedure: Right/Left Heart Cath and Coronary Angiography;  Surgeon: Tonny Bollman, MD;  Location: [facility] INVASIVE CV LAB;  Service: Cardiovascular;  Laterality: N/A;   CARDIAC CATHETERIZATION N/A 03/13/2016   Procedure: IABP Insertion;  Surgeon: Tonny Bollman, MD;  Location: [facility] INVASIVE CV LAB;  Service: Cardiovascular;  Laterality: N/A;   CATARACT EXTRACTION     CATARACT EXTRACTION, BILATERAL     CERVICAL FUSION  1982, 1992   has had 3 neck surgeries from breaking his neck   CIRCUMCISION N/A 11/07/2021   Procedure: CIRCUMCISION ADULT;  Surgeon: Despina Arias, MD;  Location: [facility] ORS;  Service: Urology;  Laterality: N/A;   CORONARY  ARTERY BYPASS GRAFT N/A 03/13/2016   Procedure: CORONARY ARTERY BYPASS GRAFTING (CABG) x 1 (SVG to OM) with EVH from LEFT GREATER SAPHENOUS VEIN;  Surgeon: Kerin Perna, MD;  Location: [facility] OR;  Service: Open Heart Surgery;  Laterality: N/A;   EYE SURGERY     LOWER EXTREMITY ANGIOGRAM  05/02/2016   Procedure: Lower Extremity Angiogram;  Surgeon: Iran Ouch, MD;  Location: [facility];  Service: Cardiovascular;;  Limited left femoral runoff right femoral runoff   PERIPHERAL VASCULAR BALLOON ANGIOPLASTY  07/17/2023   Procedure: PERIPHERAL VASCULAR BALLOON ANGIOPLASTY;  Surgeon: Iran Ouch, MD;  Location: [facility];  Service: Cardiovascular;;   PERIPHERAL VASCULAR CATHETERIZATION N/A 05/02/2016   Procedure: Abdominal Aortogram;  Surgeon: Iran Ouch, MD;  Location: [facility];  Service: Cardiovascular;  Laterality: N/A;   PERIPHERAL VASCULAR CATHETERIZATION Right 05/02/2016   Procedure: Peripheral Vascular Balloon Angioplasty;  Surgeon: Iran Ouch, MD;  Location: [facility];  Service: Cardiovascular;  Laterality: Right;  SFA   TEE WITHOUT CARDIOVERSION N/A 03/13/2016   Procedure: TRANSESOPHAGEAL ECHOCARDIOGRAM (TEE);  Surgeon: Kerin Perna, MD;  Location[facility] OR;  Service: Open Heart Surgery;  Laterality: N/A;   VSD REPAIR N/A 03/13/2016   Procedure: VENTRICULAR SEPTAL DEFECT (VSD) REPAIR;  Surgeon: Kerin Perna, MD;  Location: [facility] OR;  Service: Open Heart Surgery;  Laterality: N/A;    Family History: Family History  Problem Relation Age of Onset   Diabetes Maternal Grandmother    Diabetes Mother    Aneurysm Mother    Peripheral Artery Disease Mother    Coronary artery disease Mother    Peptic Ulcer Father    Retinoblastoma Daughter    Colon cancer Neg Hx    Rectal cancer Neg Hx     Social History  reports that he quit smoking about 7 years ago. His smoking use included cigarettes. He has never used smokeless tobacco. He  reports that he does not drink alcohol and does not use drugs.  Allergies  Allergen Reactions   Morphine And Codeine Shortness Of Breath and Other (See Comments)    UNSPECIFIED REACTION "Pt said it was too much"    Latex Rash    Medications  No current facility-administered medications for this encounter.  Current Outpatient Medications:    acetaminophen (TYLENOL) 500 MG tablet, Take 500 mg by mouth every 6 (six) hours as needed for moderate pain., Disp: , Rfl:    Blood Glucose Monitoring Suppl DEVI, 1 each by Does not apply route in the morning, at noon, and at bedtime. May substitute to any manufacturer covered by  patient's insurance., Disp: 1 each, Rfl: 1   carvedilol (COREG) 3.125 MG tablet, TAKE 1 TABLET(3.125 MG) BY MOUTH TWICE DAILY WITH A MEAL, Disp: 180 tablet, Rfl: 1   clopidogrel (PLAVIX) 75 MG tablet, Take 1 tablet (75 mg total) by mouth daily., Disp: 30 tablet, Rfl: 3   Continuous Glucose Receiver (DEXCOM G7 RECEIVER) DEVI, 1 Act by Does not apply route daily., Disp: 9 each, Rfl: 1   Continuous Glucose Sensor (DEXCOM G7 SENSOR) MISC, 1 Act by Does not apply route daily., Disp: 9 each, Rfl: 1   FARXIGA 10 MG TABS tablet, TAKE 1 TABLET(10 MG) BY MOUTH DAILY, Disp: 90 tablet, Rfl: 0   gabapentin (NEURONTIN) 300 MG capsule, TAKE 1 CAPSULE(300 MG) BY MOUTH TWICE DAILY, Disp: 180 capsule, Rfl: 0   icosapent Ethyl (VASCEPA) 1 g capsule, Take 2 capsules (2 g total) by mouth 2 (two) times daily., Disp: 360 capsule, Rfl: 1   insulin glargine (LANTUS SOLOSTAR) 100 UNIT/ML Solostar Pen, Inject 40 Units into the skin at bedtime., Disp: 36 mL, Rfl: 1   Insulin Pen Needle 31G X 8 MM MISC, 1 each by Does not apply route 4 (four) times daily., Disp: 300 each, Rfl: 2   OZEMPIC, 0.25 OR 0.5 MG/DOSE, 2 MG/3ML SOPN, Inject 0.25 mg into the skin once a week., Disp: , Rfl:    pantoprazole (PROTONIX) 40 MG tablet, TAKE 1 TABLET(40 MG) BY MOUTH TWICE DAILY BEFORE A MEAL, Disp: 180 tablet, Rfl: 0    pramipexole (MIRAPEX) 0.125 MG tablet, TAKE 1 TABLET(0.125 MG) BY MOUTH AT BEDTIME, Disp: 90 tablet, Rfl: 1   rosuvastatin (CRESTOR) 40 MG tablet, Take 1 tablet (40 mg total) by mouth daily., Disp: 90 tablet, Rfl: 1   silver sulfADIAZINE (SILVADENE) 1 % cream, Apply 1 Application topically daily. (Patient taking differently: Apply 1 Application topically daily as needed (Wound).), Disp: 400 g, Rfl: 2   torsemide (DEMADEX) 20 MG tablet, Take 1 tablet (20 mg total) by mouth every other day. TAKE 1 TABLET(20 MG) BY MOUTH 3 TIMES A WEEK Monday, Wednesday and Friday (Patient taking differently: Take 20 mg by mouth every Monday, Wednesday, and Friday.), Disp: 38 tablet, Rfl: 0   warfarin (COUMADIN) 5 MG tablet, TAKE 1 TO 1 AND 1/2 TABLETS BY MOUTH DAILY AS DIRECTED BY COUMADIN CLINIC, Disp: 120 tablet, Rfl: 1  Vitals   Vitals:   10/01/23 1808  BP: 125/71  Pulse: 81  Resp: 12  Temp: (!) 97.5 F (36.4 C)  TempSrc: Oral  SpO2: 93%    There is no height or weight on file to calculate BMI.  Physical Exam   Constitutional: Chronically ill-appearing elderly male Psych: Affect appropriate to situation.  Eyes: No scleral injection.  HENT: No OP obstruction.  Head: Normocephalic.  Cardiovascular: Normal rate and regular rhythm.  Respiratory: Effort normal, non-labored breathing.  GI: Soft.  No distension. There is no tenderness.  Skin: WDI.   Neurologic Examination   Mental Status -  Level of arousal and orientation to time, place, and person were intact. Language including expression, naming, repetition, comprehension was assessed and found intact. Attention span and concentration were normal. Recent and remote memory were intact. Fund of Knowledge was assessed and was intact.  Cranial Nerves II - XII - II - Visual field intact OU. III, IV, VI - Extraocular movements intact. V - Facial sensation intact bilaterally. VII - Facial movement intact bilaterally. VIII - Hearing & vestibular  intact bilaterally. X - Palate elevates symmetrically. XI -  Chin turning & shoulder shrug intact bilaterally. XII - Tongue protrusion intact.  Motor Strength - The patient's strength was normal in all extremities and pronator drift was absent.  Bulk was normal and fasciculations were absent.   Motor Tone - Muscle tone was assessed at the neck and appendages and was normal. Sensory - Light touch, temperature/pinprick were assessed and were symmetrical.    Coordination -bilateral upper extremities with ataxia  Gait and Station - deferred.  Labs/Imaging/Neurodiagnostic studies   CBC:  Recent Labs  Lab 2023/10/05 1831  HGB 12.6*  HCT 37.0*   Basic Metabolic Panel:  Lab Results  Component Value Date   NA 141 Oct 05, 2023   K 3.9 Oct 05, 2023   CO2 28 08/19/2023   GLUCOSE 122 (H) 10-05-2023   BUN 33 (H) Oct 05, 2023   CREATININE 1.60 (H) October 05, 2023   CALCIUM 9.8 08/19/2023   GFRNONAA 49 (L) 07/17/2023   GFRAA 44 (L) 09/01/2019   Lipid Panel:  Lab Results  Component Value Date   LDLCALC 20 08/19/2023   HgbA1c:  Lab Results  Component Value Date   HGBA1C 6.5 08/19/2023   Urine Drug Screen:     Component Value Date/Time   LABOPIA NEG 04/04/2010 2110   COCAINSCRNUR NEG 04/04/2010 2110   LABBENZ NEG 04/04/2010 2110   AMPHETMU NEG 04/04/2010 2110    Alcohol Level No results found for: "ETH" INR  Lab Results  Component Value Date   INR 2.3 09/23/2023   APTT  Lab Results  Component Value Date   APTT 45 (H) 06/04/2023   AED levels: No results found for: "PHENYTOIN", "ZONISAMIDE", "LAMOTRIGINE", "LEVETIRACETA"  CT Head without contrast(Personally reviewed): No acute process, aspects 10 mild cerebral atrophy   ASSESSMENT   William [name] is a 73 y.o. male hypertension, hyperlipidemia, paroxysmal A-fib on warfarin, seizures as a child, STEMI, diabetes, diabetic retinopathy, GERD, CHF, depression who presents to [facility], ED via EMS for evaluation.  Per chart review and  EDP patient was found unresponsive and shaking when they arrived home at around 1600.  Patient states that he laid down at 1400 and when he woke up he could not get up and he laid back down. On exam by the EDP he was noted to have ataxia of bilateral upper extremities.  CT head with no acute process.  Neurological exam is not consistent with LVO RECOMMENDATIONS  Check MRI brain w/o to evaluate for stroke. If positive for a stroke will continue with full stroke workup  Check rEEG to evaluate for seizures  Check UA, CXR CBC to evaluate for infection, other toxic/metabolic workup per ED / primary team Neurology will follow along for EEG and MRI ______________________________________________________________________  Signed, Mathews Argyle, NP [facility]  Attending Neurologist's note:  I personally saw this patient, gathering history, performing a neurologic examination, reviewing relevant labs, personally reviewing relevant imaging including head CT, and formulated the assessment and plan, adding the note above for completeness and clarity to accurately reflect my thoughts   Brooke Dare MD-PhD [facility] [phone] Available 7 AM to 7 PM, outside these hours please contact Neurologist on call listed on AMION

## 2023-10-01 NOTE — ED Triage Notes (Signed)
 Medic was called out to the home for a cardiac arrest. When medic arrived he was awake, with some shaking with fire, medic sat him up and the tremors went awake. Patient was very pale, he has had some. No cough, no fever. He was laying down for his 2pm nap, and when family returned from grocery shopping and found him to be shaking and not responding well. The call to medic was 1630   110/77 80hr 96%ra 26rr 97.3 temporal Etco2 14  20g rt wrist  18g lt wrist   LR

## 2023-10-01 NOTE — Progress Notes (Signed)
 Patient in MRI. EEG to be placed when schedule permits.

## 2023-10-01 NOTE — ED Provider Notes (Signed)
 [facility] AT [facility] Provider Note   CSN: [id number] Arrival date & time: 10/01/23  1757     History  Chief Complaint  Patient presents with   Unresponsive    William [name] is a 73 y.o. male.  The history is provided by the patient and medical records. No language interpreter was used.  Neurologic Problem This is a new problem. The current episode started 1 to 2 hours ago. The problem occurs constantly. The problem has not changed since onset.Pertinent negatives include no chest pain, no abdominal pain, no headaches and no shortness of breath. Nothing aggravates the symptoms. He has tried nothing for the symptoms. The treatment provided no relief.       Home Medications Prior to Admission medications   Medication Sig Start Date End Date Taking? Authorizing Provider  acetaminophen (TYLENOL) 500 MG tablet Take 500 mg by mouth every 6 (six) hours as needed for moderate pain.    [name], MD  Blood Glucose Monitoring Suppl DEVI 1 each by Does not apply route in the morning, at noon, and at bedtime. May substitute to any manufacturer covered by patient's insurance. 07/19/23   [name], MD  carvedilol (COREG) 3.125 MG tablet TAKE 1 TABLET(3.125 MG) BY MOUTH TWICE DAILY WITH A MEAL 02/24/23   [name], MD  clopidogrel (PLAVIX) 75 MG tablet Take 1 tablet (75 mg total) by mouth daily. 07/17/23 07/16/24  Iran Ouch, MD  Continuous Glucose Receiver (DEXCOM G7 RECEIVER) DEVI 1 Act by Does not apply route daily. 04/29/23   [name], MD  Continuous Glucose Sensor (DEXCOM G7 SENSOR) MISC 1 Act by Does not apply route daily. 04/29/23   [name], MD  FARXIGA 10 MG TABS tablet TAKE 1 TABLET(10 MG) BY MOUTH DAILY 08/14/23   [name], MD  gabapentin (NEURONTIN) 300 MG capsule TAKE 1 CAPSULE(300 MG) BY MOUTH TWICE DAILY 08/09/23   [name], MD  icosapent Ethyl (VASCEPA) 1 g capsule Take 2 capsules (2 g total) by mouth 2  (two) times daily. 08/22/23   [name], MD  insulin glargine (LANTUS SOLOSTAR) 100 UNIT/ML Solostar Pen Inject 40 Units into the skin at bedtime. 08/22/23   [name], MD  Insulin Pen Needle 31G X 8 MM MISC 1 each by Does not apply route 4 (four) times daily. 04/29/23   [name], MD  OZEMPIC, 0.25 OR 0.5 MG/DOSE, 2 MG/3ML SOPN Inject 0.25 mg into the skin once a week. 05/28/23   [provider]  pantoprazole (PROTONIX) 40 MG tablet TAKE 1 TABLET(40 MG) BY MOUTH TWICE DAILY BEFORE A MEAL 08/27/23   [name], MD  pramipexole (MIRAPEX) 0.125 MG tablet TAKE 1 TABLET(0.125 MG) BY MOUTH AT BEDTIME 08/23/23   [name], MD  rosuvastatin (CRESTOR) 40 MG tablet Take 1 tablet (40 mg total) by mouth daily. 08/22/23   [name], MD  silver sulfADIAZINE (SILVADENE) 1 % cream Apply 1 Application topically daily. Patient taking differently: Apply 1 Application topically daily as needed (Wound). 11/19/22   Felecia Shelling, DPM  torsemide (DEMADEX) 20 MG tablet Take 1 tablet (20 mg total) by mouth every other day. TAKE 1 TABLET(20 MG) BY MOUTH 3 TIMES A WEEK Monday, Wednesday and Friday Patient taking differently: Take 20 mg by mouth every Monday, Wednesday, and Friday. 05/15/23   [name], MD  warfarin (COUMADIN) 5 MG tablet TAKE 1 TO  1 AND 1/2 TABLETS BY MOUTH DAILY AS DIRECTED BY COUMADIN CLINIC 09/10/23   Iran Ouch, MD      Allergies    Morphine and codeine and Latex    Review of Systems   Review of Systems  Constitutional:  Negative for chills, fatigue and fever.  HENT:  Negative for congestion.   Respiratory:  Negative for cough, chest tightness, shortness of breath and wheezing.   Cardiovascular:  Negative for chest pain, palpitations and leg swelling.  Gastrointestinal:  Negative for abdominal pain, constipation, diarrhea, nausea and vomiting.  Genitourinary:  Negative for dysuria.  Musculoskeletal:  Negative for back pain, neck pain and neck  stiffness.  Skin:  Negative for rash and wound.  Neurological:  Positive for dizziness. Negative for syncope, speech difficulty, weakness, light-headedness, numbness and headaches.  Psychiatric/Behavioral:  Negative for agitation and confusion.   All other systems reviewed and are negative.   Physical Exam Updated Vital Signs BP 125/71 (BP Location: Right Arm)   Pulse 81   Temp (!) 97.5 F (36.4 C) (Oral)   Resp 12   SpO2 93%  Physical Exam Vitals and nursing note reviewed.  Constitutional:      General: He is not in acute distress.    Appearance: He is well-developed. He is not ill-appearing, toxic-appearing or diaphoretic.  HENT:     Head: Normocephalic and atraumatic.     Nose: No congestion or rhinorrhea.     Mouth/Throat:     Pharynx: No oropharyngeal exudate or posterior oropharyngeal erythema.  Eyes:     Extraocular Movements:     Right eye: Abnormal extraocular motion present.     Left eye: Abnormal extraocular motion present.     Conjunctiva/sclera: Conjunctivae normal.     Pupils: Pupils are equal, round, and reactive to light.     Comments: Nystagmus present  Cardiovascular:     Rate and Rhythm: Normal rate and regular rhythm.     Pulses: Normal pulses.     Heart sounds: No murmur heard. Pulmonary:     Effort: Pulmonary effort is normal. No respiratory distress.     Breath sounds: Normal breath sounds.  Abdominal:     Palpations: Abdomen is soft.     Tenderness: There is no abdominal tenderness. There is no left CVA tenderness, guarding or rebound.  Musculoskeletal:        General: No swelling or tenderness.     Cervical back: Neck supple. No tenderness.     Right lower leg: No edema.     Left lower leg: No edema.  Skin:    General: Skin is warm and dry.     Capillary Refill: Capillary refill takes less than 2 seconds.     Findings: No erythema or rash.  Neurological:     Mental Status: He is alert.     Sensory: No sensory deficit.     Motor: No  weakness.     Coordination: Coordination abnormal.  Psychiatric:        Mood and Affect: Mood normal.     ED Results / Procedures / Treatments   Labs (all labs ordered are listed, but only abnormal results are displayed) Labs Reviewed  COMPREHENSIVE METABOLIC PANEL WITH GFR - Abnormal; Notable for the following components:      Result Value   Glucose, Bld 127 (*)    BUN 32 (*)    Creatinine, Ser 1.50 (*)    Total Protein 5.8 (*)    GFR, Estimated 49 (*)  All other components within normal limits  CBC WITH DIFFERENTIAL/PLATELET - Abnormal; Notable for the following components:   Hemoglobin 12.8 (*)    HCT 38.3 (*)    Platelets 109 (*)    All other components within normal limits  PROTIME-INR - Abnormal; Notable for the following components:   Prothrombin Time 23.1 (*)    INR 2.0 (*)    All other components within normal limits  APTT - Abnormal; Notable for the following components:   aPTT 39 (*)    All other components within normal limits  I-STAT CHEM 8, ED - Abnormal; Notable for the following components:   BUN 33 (*)    Creatinine, Ser 1.60 (*)    Glucose, Bld 122 (*)    Hemoglobin 12.6 (*)    HCT 37.0 (*)    All other components within normal limits  ETHANOL  TSH  URINALYSIS, W/ REFLEX TO CULTURE (INFECTION SUSPECTED)  RAPID URINE DRUG SCREEN, HOSP PERFORMED  AMMONIA  I-STAT CG4 LACTIC ACID, ED  TYPE AND SCREEN    EKG None  Radiology MR BRAIN WO CONTRAST Result Date: 10/01/2023 CLINICAL DATA:  Diplopia and dizziness EXAM: MRI HEAD WITHOUT CONTRAST TECHNIQUE: Multiplanar, multiecho pulse sequences of the brain and surrounding structures were obtained without intravenous contrast. COMPARISON:  None Available. FINDINGS: Brain: No acute infarct, mass effect or extra-axial collection. No acute or chronic hemorrhage. Normal white matter signal, parenchymal volume and CSF spaces. The midline structures are normal. Vascular: Normal flow voids. Skull and upper cervical  spine: Normal calvarium and skull base. Visualized upper cervical spine and soft tissues are normal. Sinuses/Orbits:No paranasal sinus fluid levels or advanced mucosal thickening. No mastoid or middle ear effusion. Normal orbits. IMPRESSION: Normal brain MRI. Electronically Signed   By: Deatra Robinson M.D.   On: 10/01/2023 23:52   DG Chest Port 1 View Result Date: 10/01/2023 CLINICAL DATA:  Sepsis, pallor, shaking EXAM: PORTABLE CHEST 1 VIEW COMPARISON:  06/04/2023 FINDINGS: Single frontal view of the chest demonstrates postsurgical changes from CABG. The cardiac silhouette is unremarkable. No acute airspace disease, effusion, or pneumothorax. No acute bony abnormalities. IMPRESSION: 1. No acute intrathoracic process. Electronically Signed   By: Sharlet Salina M.D.   On: 10/01/2023 19:37   CT HEAD CODE STROKE WO CONTRAST Result Date: 10/01/2023 CLINICAL DATA:  Code stroke. Provided history: Neuro deficit, acute, stroke suspected. EXAM: CT HEAD WITHOUT CONTRAST TECHNIQUE: Contiguous axial images were obtained from the base of the skull through the vertex without intravenous contrast. RADIATION DOSE REDUCTION: This exam was performed according to the departmental dose-optimization program which includes automated exposure control, adjustment of the mA and/or kV according to patient size and/or use of iterative reconstruction technique. COMPARISON:  None. FINDINGS: Brain: Mild generalized cerebral atrophy. Small chronic cortical infarcts within the left parietal and occipital lobes. There is no acute intracranial hemorrhage. No extra-axial fluid collection. No evidence of an intracranial mass. No midline shift. Vascular: No hyperdense vessel. Atherosclerotic calcifications. Skull: No calvarial fracture or aggressive osseous lesion. Sinuses/Orbits: No mass or acute finding within the imaged orbits. Trace mucosal thickening within the bilateral ethmoid sinuses. ASPECTS [facility] Stroke Program Early CT Score) -  Ganglionic level infarction (caudate, lentiform nuclei, internal capsule, insula, M1-M3 cortex): 7 - Supraganglionic infarction (M4-M6 cortex): 3 Total score (0-10 with 10 being normal): 10 No evidence of an acute intracranial abnormality. These results were communicated to Dr. Iver Nestle At 6:46 pmon 4/1/2025by text page via the [facility] messaging system. IMPRESSION: 1.  No evidence  of an acute intracranial abnormality. 2. Mild cerebral atrophy. Electronically Signed   By: Jackey Loge D.O.   On: 10/01/2023 18:46    Procedures Procedures    Medications Ordered in ED Medications - No data to display  ED Course/ Medical Decision Making/ A&P                                 Medical Decision Making Amount and/or Complexity of Data Reviewed Labs: ordered. Radiology: ordered.    Sonny Anthes is a 73 y.o. male with a past medical history significant for hypertension, hyperlipidemia, paroxysmal atrial fibrillation on Coumadin therapy, previous seizures, CHF, previous kidney injury, VSD, and recent balloon angioplasty who presents for unresponsiveness episode and syncope.  According to patient report and EMS report, patient was at his baseline around 2 PM when he went for a nap.  He says that he woke up and could not get up and then tried to lay back down.  He then says he woke up and people were trying to help him.  He reportedly was unresponsive and was called by EMS as a cardiac arrest.  Patient is denying any chest pain or shortness of breath.  Denies any palpitations, nausea, or vomiting peer denies constipation, diarrhea, or urinary changes.  Denies any new leg pain or leg swelling denies any numbness or weakness of says that since 2 PM he has had coordination difficulties in both of his arms which are completely new and he also reports some double vision and some dizziness.  Denies any headache or neck pain at this time.  Denies trauma.  Per EMS, glucose was in the 140s.  On my initial evaluation, he  does have ataxia in both arms that seems symmetric.  He had some nystagmus as well.  Pupils are symmetric and reactive.  Symmetric smile.  Speech was clear.  Intact sensation and strength in arms and legs.  I do not appreciate a carotid bruit initially.  He has a murmur.  Will activate a code stroke due to new ataxia since 2 PM.  Patient will be seen by neurology and will get other workup.  He also reportedly was hypotensive in the 80s with EMS, he is not hypotensive now.  He is not tachycardic or tachypneic and oxygen saturations are normal on room air.  Due to his neurologic complaints and his unresponsive episode with subsequent transient hypotension, anticipate admission after workup is completed.  7:59 PM Initial CT with code stroke neurology reassuring.  They recommended MRI without contrast of the brain and EEG.  If this is reassuring, anticipate he may be safe for discharge home.  Patient now thinks he is had a bad nightmare and was still sleeping.  Family also says that some of his coordination problems may have been there for quite some time.  Labs are returning and appears somewhat similar to prior.  INR is 2.0. Chest x-ray reassuring as well.  Anticipate reassessment after workup to determine disposition.  11:48 PM Patient is resting comfortably with reassuring vital signs.  He is feeling completely normal now.  He would like to go home if workup reassuring.  He is waiting on the results of the EEG and MRI and if they are normal, will plan for discharge home.  11:59 PM MRI returned normal.  Had a shared decision making conversation with patient and he would like to go home.  EEG is still in  process but it may take quite some time until it returns.  Patient would rather go home and follow-up with outpatient PCP and neurology.  Patient now says he may have had some of the shaking in his arms in the past and not just today as he initially told me.  Patient has remained well-appearing  and stable here.  His shaking arms with ataxia has also improved when I reassessed him.  No evidence of stroke at this time.  I suspect he had a mild altered mental status episode/syncopal episode/nightmare as he suspects.  We feel he is safe for discharge home at this time.  He has not been hypotensive.  Patient will follow-up with PCP and rest and stay hydrated.  He had no other questions or concerns and will be discharged to family.         Final Clinical Impression(s) / ED Diagnoses Final diagnoses:  Altered awareness, transient  Ataxia of both arms  Dizziness    Clinical Impression: 1. Altered awareness, transient   2. Ataxia of both arms   3. Dizziness     Disposition: Discharge  Condition: Good  I have discussed the results, Dx and Tx plan with the pt(& family if present). He/she/they expressed understanding and agree(s) with the plan. Discharge instructions discussed at great length. Strict return precautions discussed and pt &/or family have verbalized understanding of the instructions. No further questions at time of discharge.    New Prescriptions   No medications on file    Follow Up: [name], MD [address] [name] [address] [phone]     [facility] Emergency Department at [facility] [address] [place] [address] [phone]         Modell Fendrick, Canary Brim, MD 10/02/23 0000

## 2023-10-01 NOTE — Code Documentation (Signed)
 Stroke Response Nurse Documentation Code Documentation  William [name] is a 73 y.o. male arriving to [facility]  via [facility] on 10/01/2023 with past medical hx of AF HTN CAD CKD DM. On warfarin daily. Code stroke was activated by ED.   Patient from home where he was LKW at 1400 and now complaining of ataxia. He was normal when he took his nap at 1400, then awoke with dizziness and couldn't get out of bed.   Stroke team at the bedside upon activation Labs drawn and patient cleared for CT by Dr. Rush Landmark. Patient to CT with team. NIHSS 2, see documentation for details and code stroke times. Patient with bilateral limb ataxia on exam. The following imaging was completed:  CT Head. Patient is not a candidate for IV Thrombolytic due to OOW, Warfarin. Patient is not a candidate for IR due to stroke not suspected.    Care Plan: VS, NIHSS q 2 for 12 hours, then q 4.   Bedside handoff with ED RN Pennie Rushing.    Argentina Donovan  Stroke Response RN

## 2023-10-02 DIAGNOSIS — R569 Unspecified convulsions: Secondary | ICD-10-CM

## 2023-10-02 LAB — TYPE AND SCREEN
ABO/RH(D): O POS
Antibody Screen: NEGATIVE

## 2023-10-02 NOTE — Discharge Instructions (Addendum)
 Your history, exam, workup today was overall reassuring.  Your CT scan and MRI did not show any evidence of acute stroke.  Your labs were overall reassuring as we discussed.  We did obtain an EEG which will look for seizures however it was not yet completed and read but it may be until tomorrow until it returns.  Given your reassuring evaluation workup and vital signs now, we feel you are safe for discharge home.  I clinically suspect you had a possible altered mental status episode that was brief related to your sleeping and waking up/nightmare as you suspected.  I have low suspicion there was a cardiac cause of your altered mental status.  Please rest and stay hydrated and follow-up with your primary doctor as well.   Please follow-up with neurology for the EEG results and if any symptoms were to change or worsen acutely, please return to the nearest emergency department.

## 2023-10-02 NOTE — Procedures (Signed)
 Patient Name: Brave Dack  MRN: [id number]  Epilepsy Attending: Charlsie Quest  Referring Physician/Provider: Tegeler, Canary Brim, MD  Date: 10/01/2023 Duration: 26.04 mins  Patient history: 72yo m with syncope. EEG to evaluate for seizure  Level of alertness: Awake  AEDs during EEG study: None  Technical aspects: This EEG study was done with scalp electrodes positioned according to the 10-20 International system of electrode placement. Electrical activity was reviewed with band pass filter of 1-70Hz , sensitivity of 7 uV/mm, display speed of 55mm/sec with a 60Hz  notched filter applied as appropriate. EEG data were recorded continuously and digitally stored.  Video monitoring was available and reviewed as appropriate.  Description: The posterior dominant rhythm consists of 8 Hz activity of moderate voltage (25-35 uV) seen predominantly in posterior head regions, symmetric and reactive to eye opening and eye closing. Physiologic photic driving was not seen during photic stimulation.  Hyperventilation was not performed.     Study was technically difficult due to significant myogenic artifact.  IMPRESSION: This technically difficult study is within normal limits. No seizures or epileptiform discharges were seen throughout the recording.  A normal interictal EEG does not exclude the diagnosis of epilepsy.  Jeslyn Amsler Annabelle Harman

## 2023-10-03 ENCOUNTER — Telehealth: Payer: Self-pay

## 2023-10-03 NOTE — Transitions of Care (Post Inpatient/ED Visit) (Signed)
 10/03/2023  Name: William [name] MRN: [id number] DOB: 1950-10-10  Today's TOC FU Call Status: Today's TOC FU Call Status:: Successful TOC FU Call Completed TOC FU Call Complete Date: 10/03/23 Patient's Name and Date of Birth confirmed.  Transition Care Management Follow-up Telephone Call Date of Discharge: 10/02/23 Discharge Facility: [facility]) Type of Discharge: Emergency Department Reason for ED Visit: Other: (altered awareness) How have you been since you were released from the hospital?: Better Any questions or concerns?: No  Items Reviewed: Did you receive and understand the discharge instructions provided?: Yes Medications obtained,verified, and reconciled?: Yes (Medications Reviewed) Any new allergies since your discharge?: No Dietary orders reviewed?: Yes Do you have support at home?: Yes People in Home: child(ren), adult  Medications Reviewed Today: Medications Reviewed Today     Reviewed by Karena Addison, LPN (Licensed Practical Nurse) on 10/03/23 at 1123  Med List Status: <None>   Medication Order Taking? Sig Documenting Provider Last Dose Status Informant  acetaminophen (TYLENOL) 500 MG tablet [id number] No Take 500 mg by mouth every 6 (six) hours as needed for moderate pain. [name], MD Taking Active Child           Med Note Amil Amen   Wed Jun 05, 2023  7:55 AM)    Blood Glucose Monitoring Suppl DEVI [id number] No 1 each by Does not apply route in the morning, at noon, and at bedtime. May substitute to any manufacturer covered by patient's insurance. [name], MD Taking Active   carvedilol (COREG) 3.125 MG tablet [id number] No TAKE 1 TABLET(3.125 MG) BY MOUTH TWICE DAILY WITH A MEAL [name], MD Taking Active Child  clopidogrel (PLAVIX) 75 MG tablet [id number] No Take 1 tablet (75 mg total) by mouth daily. Iran Ouch, MD Taking Active   Continuous Glucose Receiver [facility] G7 RECEIVER) [place] [id number] No 1 Act by Does not  apply route daily. [name], MD Taking Active Child  Continuous Glucose Sensor (DEXCOM G7 SENSOR) [place] [id number] No 1 Act by Does not apply route daily. [name], MD Taking Active Child  FARXIGA 10 MG TABS tablet [id number] No TAKE 1 TABLET(10 MG) BY MOUTH DAILY [name], MD Taking Active   gabapentin (NEURONTIN) 300 MG capsule [id number] No TAKE 1 CAPSULE(300 MG) BY MOUTH TWICE DAILY [name], MD Taking Active   icosapent Ethyl (VASCEPA) 1 g capsule [id number] No Take 2 capsules (2 g total) by mouth 2 (two) times daily. [name], MD Taking Active   insulin glargine (LANTUS SOLOSTAR) 100 UNIT/ML Solostar Pen [id number] No Inject 40 Units into the skin at bedtime. [name], MD Taking Active   Insulin Pen Needle 31G X 8 MM MISC [id number] No 1 each by Does not apply route 4 (four) times daily. [name], MD Taking Active Child  OZEMPIC, 0.25 OR 0.5 MG/DOSE, 2 MG/3ML SOPN [id number] No Inject 0.25 mg into the skin once a week. [provider] Taking Active Child           Med Note Christena Flake Jul 11, 2023  3:14 PM) Every thursday  pantoprazole (PROTONIX) 40 MG tablet [id number] No TAKE 1 TABLET(40 MG) BY MOUTH TWICE DAILY BEFORE A MEAL [name], MD Taking Active   pramipexole (MIRAPEX) 0.125 MG tablet [id number] No TAKE 1 TABLET(0.125 MG) BY MOUTH AT BEDTIME [name], MD Taking Active   rosuvastatin (CRESTOR) 40 MG tablet [id number] No Take 1  tablet (40 mg total) by mouth daily. [name], MD Taking Active   silver sulfADIAZINE (SILVADENE) 1 % cream [id number] No Apply 1 Application topically daily.  Patient taking differently: Apply 1 Application topically daily as needed (Wound).   Felecia Shelling, DPM Taking Active Child  torsemide (DEMADEX) 20 MG tablet [id number] No Take 1 tablet (20 mg total) by mouth every other day. TAKE 1 TABLET(20 MG) BY MOUTH 3 TIMES A WEEK Monday, Wednesday and Friday  Patient taking differently:  Take 20 mg by mouth every Monday, Wednesday, and Friday.   [name], MD Taking Active Child  warfarin (COUMADIN) 5 MG tablet [id number] No TAKE 1 TO 1 AND 1/2 TABLETS BY MOUTH DAILY AS DIRECTED BY COUMADIN CLINIC Iran Ouch, MD Taking Active             Home Care and Equipment/Supplies: Were Home Health Services Ordered?: NA Any new equipment or medical supplies ordered?: NA  Functional Questionnaire: Do you need assistance with bathing/showering or dressing?: No Do you need assistance with meal preparation?: No Do you need assistance with eating?: No Do you have difficulty maintaining continence: No Do you need assistance with getting out of bed/getting out of a chair/moving?: No Do you have difficulty managing or taking your medications?: No  Follow up appointments reviewed: PCP Follow-up appointment confirmed?: No (declined , will call back to schedule) [facility] Follow-up appointment confirmed?: NA Do you need transportation to your follow-up appointment?: No Do you understand care options if your condition(s) worsen?: Yes-patient verbalized understanding    SIGNATURE Karena Addison, LPN [facility] Nurse Health Advisor Direct Dial [phone]

## 2023-10-07 ENCOUNTER — Encounter (HOSPITAL_BASED_OUTPATIENT_CLINIC_OR_DEPARTMENT_OTHER): Payer: 59 | Attending: Internal Medicine | Admitting: Internal Medicine

## 2023-10-07 DIAGNOSIS — L97528 Non-pressure chronic ulcer of other part of left foot with other specified severity: Secondary | ICD-10-CM | POA: Insufficient documentation

## 2023-10-07 DIAGNOSIS — L97818 Non-pressure chronic ulcer of other part of right lower leg with other specified severity: Secondary | ICD-10-CM | POA: Insufficient documentation

## 2023-10-07 DIAGNOSIS — I87311 Chronic venous hypertension (idiopathic) with ulcer of right lower extremity: Secondary | ICD-10-CM | POA: Insufficient documentation

## 2023-10-07 DIAGNOSIS — E11621 Type 2 diabetes mellitus with foot ulcer: Secondary | ICD-10-CM | POA: Insufficient documentation

## 2023-10-14 ENCOUNTER — Encounter (HOSPITAL_BASED_OUTPATIENT_CLINIC_OR_DEPARTMENT_OTHER): Admitting: Internal Medicine

## 2023-10-14 DIAGNOSIS — Z794 Long term (current) use of insulin: Secondary | ICD-10-CM | POA: Diagnosis not present

## 2023-10-14 DIAGNOSIS — E11621 Type 2 diabetes mellitus with foot ulcer: Secondary | ICD-10-CM | POA: Diagnosis not present

## 2023-10-14 DIAGNOSIS — I87311 Chronic venous hypertension (idiopathic) with ulcer of right lower extremity: Secondary | ICD-10-CM | POA: Diagnosis not present

## 2023-10-14 DIAGNOSIS — Z7901 Long term (current) use of anticoagulants: Secondary | ICD-10-CM | POA: Diagnosis not present

## 2023-10-14 DIAGNOSIS — L97528 Non-pressure chronic ulcer of other part of left foot with other specified severity: Secondary | ICD-10-CM | POA: Diagnosis not present

## 2023-10-14 DIAGNOSIS — L97522 Non-pressure chronic ulcer of other part of left foot with fat layer exposed: Secondary | ICD-10-CM | POA: Diagnosis not present

## 2023-10-14 DIAGNOSIS — L97818 Non-pressure chronic ulcer of other part of right lower leg with other specified severity: Secondary | ICD-10-CM | POA: Diagnosis not present

## 2023-10-21 ENCOUNTER — Encounter (HOSPITAL_BASED_OUTPATIENT_CLINIC_OR_DEPARTMENT_OTHER): Admitting: Internal Medicine

## 2023-10-21 DIAGNOSIS — I87311 Chronic venous hypertension (idiopathic) with ulcer of right lower extremity: Secondary | ICD-10-CM | POA: Diagnosis not present

## 2023-10-21 DIAGNOSIS — E1151 Type 2 diabetes mellitus with diabetic peripheral angiopathy without gangrene: Secondary | ICD-10-CM | POA: Diagnosis not present

## 2023-10-21 DIAGNOSIS — L97528 Non-pressure chronic ulcer of other part of left foot with other specified severity: Secondary | ICD-10-CM | POA: Diagnosis not present

## 2023-10-21 DIAGNOSIS — E11621 Type 2 diabetes mellitus with foot ulcer: Secondary | ICD-10-CM

## 2023-10-21 DIAGNOSIS — L97818 Non-pressure chronic ulcer of other part of right lower leg with other specified severity: Secondary | ICD-10-CM | POA: Diagnosis not present

## 2023-10-28 ENCOUNTER — Encounter (HOSPITAL_BASED_OUTPATIENT_CLINIC_OR_DEPARTMENT_OTHER): Admitting: Internal Medicine

## 2023-10-28 DIAGNOSIS — L97818 Non-pressure chronic ulcer of other part of right lower leg with other specified severity: Secondary | ICD-10-CM | POA: Diagnosis not present

## 2023-10-28 DIAGNOSIS — L97522 Non-pressure chronic ulcer of other part of left foot with fat layer exposed: Secondary | ICD-10-CM | POA: Diagnosis not present

## 2023-10-28 DIAGNOSIS — E11621 Type 2 diabetes mellitus with foot ulcer: Secondary | ICD-10-CM | POA: Diagnosis not present

## 2023-10-28 DIAGNOSIS — L97528 Non-pressure chronic ulcer of other part of left foot with other specified severity: Secondary | ICD-10-CM | POA: Diagnosis not present

## 2023-10-28 DIAGNOSIS — I87311 Chronic venous hypertension (idiopathic) with ulcer of right lower extremity: Secondary | ICD-10-CM | POA: Diagnosis not present

## 2023-10-28 DIAGNOSIS — Z794 Long term (current) use of insulin: Secondary | ICD-10-CM | POA: Diagnosis not present

## 2023-11-04 ENCOUNTER — Ambulatory Visit: Attending: Cardiovascular Disease

## 2023-11-04 DIAGNOSIS — Z5181 Encounter for therapeutic drug level monitoring: Secondary | ICD-10-CM | POA: Diagnosis not present

## 2023-11-04 DIAGNOSIS — I480 Paroxysmal atrial fibrillation: Secondary | ICD-10-CM | POA: Diagnosis not present

## 2023-11-04 LAB — POCT INR: INR: 1.7 — AB (ref 2.0–3.0)

## 2023-11-04 NOTE — Patient Instructions (Signed)
 Take 1.5 tablets today only then  Continue taking 1.5 tablets daily except for 1 tablet every Mondays, Wednesdays and Fridays. Recheck INR in 2 weeks.  Stay consistent with [facility] [phone]

## 2023-11-06 ENCOUNTER — Other Ambulatory Visit: Payer: Self-pay | Admitting: Internal Medicine

## 2023-11-06 DIAGNOSIS — I10 Essential (primary) hypertension: Secondary | ICD-10-CM

## 2023-11-06 DIAGNOSIS — E1152 Type 2 diabetes mellitus with diabetic peripheral angiopathy with gangrene: Secondary | ICD-10-CM

## 2023-11-06 DIAGNOSIS — I2510 Atherosclerotic heart disease of native coronary artery without angina pectoris: Secondary | ICD-10-CM

## 2023-11-06 DIAGNOSIS — I5022 Chronic systolic (congestive) heart failure: Secondary | ICD-10-CM

## 2023-11-07 ENCOUNTER — Encounter: Payer: Self-pay | Admitting: Internal Medicine

## 2023-11-07 ENCOUNTER — Other Ambulatory Visit: Payer: Self-pay | Admitting: Internal Medicine

## 2023-11-07 ENCOUNTER — Ambulatory Visit: Admitting: Internal Medicine

## 2023-11-07 VITALS — BP 140/70 | HR 70 | Temp 97.6°F | Ht 65.0 in | Wt 177.8 lb

## 2023-11-07 DIAGNOSIS — E1122 Type 2 diabetes mellitus with diabetic chronic kidney disease: Secondary | ICD-10-CM | POA: Diagnosis not present

## 2023-11-07 DIAGNOSIS — Z7984 Long term (current) use of oral hypoglycemic drugs: Secondary | ICD-10-CM

## 2023-11-07 DIAGNOSIS — I10 Essential (primary) hypertension: Secondary | ICD-10-CM

## 2023-11-07 DIAGNOSIS — Z794 Long term (current) use of insulin: Secondary | ICD-10-CM

## 2023-11-07 DIAGNOSIS — I480 Paroxysmal atrial fibrillation: Secondary | ICD-10-CM | POA: Diagnosis not present

## 2023-11-07 DIAGNOSIS — N1832 Chronic kidney disease, stage 3b: Secondary | ICD-10-CM

## 2023-11-07 DIAGNOSIS — E119 Type 2 diabetes mellitus without complications: Secondary | ICD-10-CM

## 2023-11-07 LAB — POCT GLYCOSYLATED HEMOGLOBIN (HGB A1C): Hemoglobin A1C: 6.5 % — AB (ref 4.0–5.6)

## 2023-11-07 NOTE — Progress Notes (Signed)
 Subjective:  Patient ID: William [name], male    DOB: 1950-08-23  Age: 73 y.o. MRN: [id number]  CC: Hospitalization Follow-up (10/01/2023/) and Diabetes   HPI William [name] presents for f/up   ------  Discussed the use of AI scribe software for clinical note transcription with the patient, who gave verbal consent to proceed.  History of Present Illness   William [name] is a 73 year old male with diabetes and heart failure who presents with concerns following a recent hospital visit.  He recently experienced a nightmare where he dreamt he was dying, prompting a hospital visit. Since then, he has had no additional symptoms.  During his hospital stay, he underwent multiple tests, including MRIs. There is uncertainty about whether an A1c test was performed. He is concerned about his diabetes management, as it has been over three months since his last A1c test. He is currently on Ozempic for diabetes management.  There were issues during his hospital stay where he was not provided with food or his medications, and he was denied even ice chips, which is concerning given his diabetes and heart failure.  He has a history of heart failure and reports that his heart has been beating irregularly since a previous surgery. No current breathing difficulties.  He has a follow-up appointment with his kidney doctor next Monday, where further labs are expected to be conducted.       Outpatient Medications Prior to Visit  Medication Sig Dispense Refill   acetaminophen  (TYLENOL ) 500 MG tablet Take 500 mg by mouth every 6 (six) hours as needed for moderate pain.     Blood Glucose Monitoring Suppl DEVI 1 each by Does not apply route in the morning, at noon, and at bedtime. May substitute to any manufacturer covered by patient's insurance. 1 each 1   carvedilol  (COREG ) 3.125 MG tablet TAKE 1 TABLET(3.125 MG) BY MOUTH TWICE DAILY WITH A MEAL 180 tablet 1   clopidogrel  (PLAVIX ) 75 MG tablet Take 1 tablet (75 mg total)  by mouth daily. 30 tablet 3   Continuous Glucose Receiver (DEXCOM G7 RECEIVER) DEVI 1 Act by Does not apply route daily. 9 each 1   Continuous Glucose Sensor (DEXCOM G7 SENSOR) MISC 1 Act by Does not apply route daily. 9 each 1   FARXIGA  10 MG TABS tablet TAKE 1 TABLET(10 MG) BY MOUTH DAILY 90 tablet 0   gabapentin  (NEURONTIN ) 300 MG capsule TAKE 1 CAPSULE(300 MG) BY MOUTH TWICE DAILY 180 capsule 0   icosapent  Ethyl (VASCEPA ) 1 g capsule Take 2 capsules (2 g total) by mouth 2 (two) times daily. 360 capsule 1   insulin  glargine (LANTUS  SOLOSTAR) 100 UNIT/ML Solostar Pen Inject 40 Units into the skin at bedtime. 36 mL 1   Insulin  Pen Needle 31G X 8 MM MISC 1 each by Does not apply route 4 (four) times daily. 300 each 2   OZEMPIC, 0.25 OR 0.5 MG/DOSE, 2 MG/3ML SOPN Inject 0.25 mg into the skin once a week.     pantoprazole  (PROTONIX ) 40 MG tablet TAKE 1 TABLET(40 MG) BY MOUTH TWICE DAILY BEFORE A MEAL 180 tablet 0   pramipexole  (MIRAPEX ) 0.125 MG tablet TAKE 1 TABLET(0.125 MG) BY MOUTH AT BEDTIME 90 tablet 1   rosuvastatin  (CRESTOR ) 40 MG tablet Take 1 tablet (40 mg total) by mouth daily. 90 tablet 1   silver  sulfADIAZINE  (SILVADENE ) 1 % cream Apply 1 Application topically daily. (Patient taking differently: Apply 1 Application topically daily as needed (Wound).) 400 g  2   torsemide  (DEMADEX ) 20 MG tablet Take 1 tablet (20 mg total) by mouth every other day. TAKE 1 TABLET(20 MG) BY MOUTH 3 TIMES A WEEK Monday, Wednesday and Friday (Patient taking differently: Take 20 mg by mouth every Monday, Wednesday, and Friday.) 38 tablet 0   warfarin (COUMADIN ) 5 MG tablet TAKE 1 TO 1 AND 1/2 TABLETS BY MOUTH DAILY AS DIRECTED BY COUMADIN  CLINIC 120 tablet 1   No facility-administered medications prior to visit.    ROS Review of Systems  Constitutional:  Positive for unexpected weight change (wt loss). Negative for chills, diaphoresis and fatigue.  HENT:  Negative for trouble swallowing.   Eyes: Negative.    Respiratory:  Negative for cough, chest tightness, shortness of breath and wheezing.   Cardiovascular:  Negative for chest pain, palpitations and leg swelling.  Gastrointestinal: Negative.  Negative for abdominal pain, constipation, diarrhea, nausea and vomiting.  Endocrine: Negative.   Genitourinary: Negative.  Negative for difficulty urinating.  Musculoskeletal:  Positive for gait problem.  Neurological:  Negative for dizziness and weakness.  Hematological:  Negative for adenopathy. Does not bruise/bleed easily.  Psychiatric/Behavioral: Negative.      Objective:  BP (!) 140/70 (BP Location: Left Arm, Patient Position: Sitting, Cuff Size: Normal)   Pulse 70   Temp 97.6 F (36.4 C) (Oral)   Ht 5\' 5"  (1.651 m)   Wt 177 lb 12.8 oz (80.6 kg)   SpO2 99%   BMI 29.59 kg/m   BP Readings from Last 3 Encounters:  11/07/23 (!) 140/70  10/02/23 118/84  08/20/23 121/72    Wt Readings from Last 3 Encounters:  11/07/23 177 lb 12.8 oz (80.6 kg)  08/20/23 188 lb 3.2 oz (85.4 kg)  08/19/23 185 lb 9.6 oz (84.2 kg)    Physical Exam Vitals reviewed.  Constitutional:      General: He is not in acute distress.    Appearance: He is ill-appearing. He is not toxic-appearing or diaphoretic.  HENT:     Mouth/Throat:     Mouth: Mucous membranes are moist.  Eyes:     General: No scleral icterus.    Conjunctiva/sclera: Conjunctivae normal.  Cardiovascular:     Rate and Rhythm: Normal rate and regular rhythm.     Heart sounds: No murmur heard.    No friction rub. No gallop.  Pulmonary:     Effort: Pulmonary effort is normal.     Breath sounds: No stridor. No wheezing, rhonchi or rales.  Abdominal:     General: Abdomen is flat.     Palpations: There is no mass.     Tenderness: There is no abdominal tenderness. There is no guarding.     Hernia: No hernia is present.  Musculoskeletal:        General: Normal range of motion.     Cervical back: Neck supple.     Right lower leg: No edema.      Left lower leg: No edema.  Lymphadenopathy:     Cervical: No cervical adenopathy.  Skin:    General: Skin is warm and dry.     Coloration: Skin is pale.  Neurological:     General: No focal deficit present.     Mental Status: He is alert.  Psychiatric:        Mood and Affect: Mood normal.        Behavior: Behavior normal.     Lab Results  Component Value Date   WBC 5.7 10/01/2023   HGB 12.6 (  L) 10/01/2023   HCT 37.0 (L) 10/01/2023   PLT 109 (L) 10/01/2023   GLUCOSE 122 (H) 10/01/2023   CHOL 106 08/19/2023   TRIG 296.0 (H) 08/19/2023   HDL 26.10 (L) 08/19/2023   LDLDIRECT 54.0 11/05/2022   LDLCALC 20 08/19/2023   ALT 28 10/01/2023   AST 25 10/01/2023   NA 141 10/01/2023   K 3.9 10/01/2023   CL 105 10/01/2023   CREATININE 1.60 (H) 10/01/2023   BUN 33 (H) 10/01/2023   CO2 22 10/01/2023   TSH 0.655 10/01/2023   PSA 3.50 05/07/2022   INR 1.7 (A) 11/04/2023   HGBA1C 6.5 (A) 11/07/2023   MICROALBUR 2.6 (H) 04/29/2023    EEG adult Result Date: 10/02/2023 Arleene Lack, MD     10/02/2023  8:48 AM Patient Name: Marico General MRN: [id number] Epilepsy Attending: Arleene Lack Referring Physician/Provider: Tegeler, Marine Sia, MD Date: 10/01/2023 Duration: 26.04 mins Patient history: 72yo m with syncope. EEG to evaluate for seizure Level of alertness: Awake AEDs during EEG study: None Technical aspects: This EEG study was done with scalp electrodes positioned according to the 10-20 International system of electrode placement. Electrical activity was reviewed with band pass filter of 1-70Hz , sensitivity of 7 uV/mm, display speed of 62mm/sec with a 60Hz  notched filter applied as appropriate. EEG data were recorded continuously and digitally stored.  Video monitoring was available and reviewed as appropriate. Description: The posterior dominant rhythm consists of 8 Hz activity of moderate voltage (25-35 uV) seen predominantly in posterior head regions, symmetric and reactive to eye  opening and eye closing. Physiologic photic driving was not seen during photic stimulation.  Hyperventilation was not performed.   Study was technically difficult due to significant myogenic artifact. IMPRESSION: This technically difficult study is within normal limits. No seizures or epileptiform discharges were seen throughout the recording. A normal interictal EEG does not exclude the diagnosis of epilepsy. Arleene Lack   MR BRAIN WO CONTRAST Result Date: 10/01/2023 CLINICAL DATA:  Diplopia and dizziness EXAM: MRI HEAD WITHOUT CONTRAST TECHNIQUE: Multiplanar, multiecho pulse sequences of the brain and surrounding structures were obtained without intravenous contrast. COMPARISON:  None Available. FINDINGS: Brain: No acute infarct, mass effect or extra-axial collection. No acute or chronic hemorrhage. Normal white matter signal, parenchymal volume and CSF spaces. The midline structures are normal. Vascular: Normal flow voids. Skull and upper cervical spine: Normal calvarium and skull base. Visualized upper cervical spine and soft tissues are normal. Sinuses/Orbits:No paranasal sinus fluid levels or advanced mucosal thickening. No mastoid or middle ear effusion. Normal orbits. IMPRESSION: Normal brain MRI. Electronically Signed   By: Juanetta Nordmann M.D.   On: 10/01/2023 23:52   DG Chest Port 1 View Result Date: 10/01/2023 CLINICAL DATA:  Sepsis, pallor, shaking EXAM: PORTABLE CHEST 1 VIEW COMPARISON:  06/04/2023 FINDINGS: Single frontal view of the chest demonstrates postsurgical changes from CABG. The cardiac silhouette is unremarkable. No acute airspace disease, effusion, or pneumothorax. No acute bony abnormalities. IMPRESSION: 1. No acute intrathoracic process. Electronically Signed   By: Bobbye Burrow M.D.   On: 10/01/2023 19:37   CT HEAD CODE STROKE WO CONTRAST Result Date: 10/01/2023 CLINICAL DATA:  Code stroke. Provided history: Neuro deficit, acute, stroke suspected. EXAM: CT HEAD WITHOUT CONTRAST  TECHNIQUE: Contiguous axial images were obtained from the base of the skull through the vertex without intravenous contrast. RADIATION DOSE REDUCTION: This exam was performed according to the departmental dose-optimization program which includes automated exposure control, adjustment of the mA and/or  kV according to patient size and/or use of iterative reconstruction technique. COMPARISON:  None. FINDINGS: Brain: Mild generalized cerebral atrophy. Small chronic cortical infarcts within the left parietal and occipital lobes. There is no acute intracranial hemorrhage. No extra-axial fluid collection. No evidence of an intracranial mass. No midline shift. Vascular: No hyperdense vessel. Atherosclerotic calcifications. Skull: No calvarial fracture or aggressive osseous lesion. Sinuses/Orbits: No mass or acute finding within the imaged orbits. Trace mucosal thickening within the bilateral ethmoid sinuses. ASPECTS [facility] Stroke Program Early CT Score) - Ganglionic level infarction (caudate, lentiform nuclei, internal capsule, insula, M1-M3 cortex): 7 - Supraganglionic infarction (M4-M6 cortex): 3 Total score (0-10 with 10 being normal): 10 No evidence of an acute intracranial abnormality. These results were communicated to Dr. Cleone Dad At 6:46 pmon 4/1/2025by text page via the [facility] messaging system. IMPRESSION: 1.  No evidence of an acute intracranial abnormality. 2. Mild cerebral atrophy. Electronically Signed   By: Bascom Lily D.O.   On: 10/01/2023 18:46    Assessment & Plan:   Type 2 diabetes mellitus with stage 3b chronic kidney disease, without long-term current use of insulin  ([name])- Blood sugar is well controlled. -     POCT glycosylated hemoglobin (Hb A1C)  Paroxysmal atrial fibrillation ([name])- He has good R/R control.  Essential hypertension- His BP is well controlled.     Follow-up: Return in about 6 months (around 05/09/2024).  Sandra Crouch, MD

## 2023-11-07 NOTE — Patient Instructions (Signed)

## 2023-11-08 ENCOUNTER — Other Ambulatory Visit: Payer: Self-pay | Admitting: Internal Medicine

## 2023-11-08 DIAGNOSIS — I2510 Atherosclerotic heart disease of native coronary artery without angina pectoris: Secondary | ICD-10-CM

## 2023-11-08 DIAGNOSIS — E785 Hyperlipidemia, unspecified: Secondary | ICD-10-CM

## 2023-11-08 DIAGNOSIS — I739 Peripheral vascular disease, unspecified: Secondary | ICD-10-CM

## 2023-11-11 ENCOUNTER — Other Ambulatory Visit: Payer: Self-pay | Admitting: Internal Medicine

## 2023-11-11 DIAGNOSIS — I129 Hypertensive chronic kidney disease with stage 1 through stage 4 chronic kidney disease, or unspecified chronic kidney disease: Secondary | ICD-10-CM | POA: Diagnosis not present

## 2023-11-11 DIAGNOSIS — E1122 Type 2 diabetes mellitus with diabetic chronic kidney disease: Secondary | ICD-10-CM | POA: Diagnosis not present

## 2023-11-11 DIAGNOSIS — D631 Anemia in chronic kidney disease: Secondary | ICD-10-CM | POA: Diagnosis not present

## 2023-11-11 DIAGNOSIS — E875 Hyperkalemia: Secondary | ICD-10-CM | POA: Diagnosis not present

## 2023-11-11 DIAGNOSIS — I10 Essential (primary) hypertension: Secondary | ICD-10-CM

## 2023-11-11 DIAGNOSIS — N189 Chronic kidney disease, unspecified: Secondary | ICD-10-CM | POA: Diagnosis not present

## 2023-11-11 DIAGNOSIS — I2510 Atherosclerotic heart disease of native coronary artery without angina pectoris: Secondary | ICD-10-CM

## 2023-11-11 DIAGNOSIS — I5022 Chronic systolic (congestive) heart failure: Secondary | ICD-10-CM

## 2023-11-11 DIAGNOSIS — E1152 Type 2 diabetes mellitus with diabetic peripheral angiopathy with gangrene: Secondary | ICD-10-CM

## 2023-11-11 DIAGNOSIS — N1832 Chronic kidney disease, stage 3b: Secondary | ICD-10-CM | POA: Diagnosis not present

## 2023-11-11 NOTE — Telephone Encounter (Signed)
 Last Fill: 08/09/23  Last OV: 11/07/23 Next OV: 05/11/24  Routing to provider for review/authorization.

## 2023-11-11 NOTE — Telephone Encounter (Signed)
 Last Fill: 02/24/23  Last OV: 11/07/23 Next OV: 05/11/24  Routing to provider for review/authorization.

## 2023-11-11 NOTE — Telephone Encounter (Signed)
 Copied from CRM [id number]. Topic: Clinical - Medication Refill >> Nov 11, 2023 10:34 AM Earnestine Goes B wrote: Medication: gabapentin  (NEURONTIN ) 300 MG capsule  Has the patient contacted their pharmacy? Yes (Agent: If no, request that the patient contact the pharmacy for the refill. If patient does not wish to contact the pharmacy document the reason why and proceed with request.) (Agent: If yes, when and what did the pharmacy advise?)  This is the patient's preferred pharmacy:  [id number] - [place], [place] - [address] [place] [address] Phone: [phone] Fax: [phone]  Is this the correct pharmacy for this prescription? Yes If no, delete pharmacy and type the correct one.   Has the prescription been filled recently? Yes  Is the patient out of the medication? Yes  Has the patient been seen for an appointment in the last year OR does the patient have an upcoming appointment? Yes  Can we respond through MyChart? Yes  Agent: Please be advised that Rx refills may take up to 3 business days. We ask that you follow-up with your pharmacy.

## 2023-11-11 NOTE — Telephone Encounter (Signed)
 Copied from CRM [id number]. Topic: Clinical - Medication Refill >> Nov 11, 2023 10:32 AM [name] K wrote: Medication: carvedilol  (COREG ) 3.125 MG tablet  Has the patient contacted their pharmacy? Yes (Agent: If no, request that the patient contact the pharmacy for the refill. If patient does not wish to contact the pharmacy document the reason why and proceed with request.) (Agent: If yes, when and what did the pharmacy advise?) Pharmacy needs order to refill   This is the patient's preferred pharmacy:  [id number] - [place], [place] - [address] [facility] [address] [place] [address] Phone: [phone] Fax: [phone]  Is this the correct pharmacy for this prescription? Yes If no, delete pharmacy and type the correct one.   Has the prescription been filled recently? No  Is the patient out of the medication? No  Has the patient been seen for an appointment in the last year OR does the patient have an upcoming appointment? Yes  Can we respond through MyChart? No  Agent: Please be advised that Rx refills may take up to 3 business days. We ask that you follow-up with your pharmacy.

## 2023-11-12 MED ORDER — CARVEDILOL 3.125 MG PO TABS: ORAL_TABLET | ORAL | 1 refills | Status: DC

## 2023-11-14 ENCOUNTER — Other Ambulatory Visit: Payer: Self-pay | Admitting: Cardiovascular Disease

## 2023-11-15 ENCOUNTER — Other Ambulatory Visit: Payer: Self-pay | Admitting: Internal Medicine

## 2023-11-15 DIAGNOSIS — E1152 Type 2 diabetes mellitus with diabetic peripheral angiopathy with gangrene: Secondary | ICD-10-CM

## 2023-11-15 DIAGNOSIS — N1832 Chronic kidney disease, stage 3b: Secondary | ICD-10-CM

## 2023-11-15 DIAGNOSIS — I5022 Chronic systolic (congestive) heart failure: Secondary | ICD-10-CM

## 2023-11-15 DIAGNOSIS — E118 Type 2 diabetes mellitus with unspecified complications: Secondary | ICD-10-CM

## 2023-11-16 ENCOUNTER — Other Ambulatory Visit: Payer: Self-pay | Admitting: Internal Medicine

## 2023-11-16 DIAGNOSIS — E118 Type 2 diabetes mellitus with unspecified complications: Secondary | ICD-10-CM

## 2023-11-16 DIAGNOSIS — I5022 Chronic systolic (congestive) heart failure: Secondary | ICD-10-CM

## 2023-11-16 DIAGNOSIS — N1832 Chronic kidney disease, stage 3b: Secondary | ICD-10-CM

## 2023-11-18 ENCOUNTER — Encounter (HOSPITAL_BASED_OUTPATIENT_CLINIC_OR_DEPARTMENT_OTHER): Attending: Internal Medicine | Admitting: Internal Medicine

## 2023-11-18 DIAGNOSIS — L97528 Non-pressure chronic ulcer of other part of left foot with other specified severity: Secondary | ICD-10-CM | POA: Insufficient documentation

## 2023-11-18 DIAGNOSIS — E11621 Type 2 diabetes mellitus with foot ulcer: Secondary | ICD-10-CM | POA: Insufficient documentation

## 2023-11-18 DIAGNOSIS — T798XXA Other early complications of trauma, initial encounter: Secondary | ICD-10-CM | POA: Insufficient documentation

## 2023-11-18 DIAGNOSIS — E1151 Type 2 diabetes mellitus with diabetic peripheral angiopathy without gangrene: Secondary | ICD-10-CM | POA: Diagnosis not present

## 2023-11-19 ENCOUNTER — Ambulatory Visit

## 2023-11-19 ENCOUNTER — Ambulatory Visit: Attending: Cardiovascular Disease

## 2023-11-19 ENCOUNTER — Telehealth: Payer: Self-pay | Admitting: Cardiovascular Disease

## 2023-11-19 ENCOUNTER — Ambulatory Visit: Payer: 59 | Admitting: Cardiovascular Disease

## 2023-11-19 DIAGNOSIS — I480 Paroxysmal atrial fibrillation: Secondary | ICD-10-CM | POA: Diagnosis not present

## 2023-11-19 LAB — POCT INR: INR: 1.7 — AB (ref 2.0–3.0)

## 2023-11-19 NOTE — Progress Notes (Deleted)
 Cardiology Office Note   Date:  11/19/2023   ID:  William [name], DOB 12-04-1950, MRN [id number]  PCP:  Arcadio Knuckles, MD  Cardiologist:   Antionette Kirks, MD   No chief complaint on file.      History of Present Illness: William [name] is a 73 y.o. male who is here today for a follow-up visit regarding peripheral arterial disease, coronary artery disease and paroxysmal atrial fibrillation. He has known history of inferior myocardial infarction complicated by VSD in September, 2017. He is status post one-vessel CABG and VSD repair. He had postoperative atrial fibrillation and has been on anticoagulation. He had amputation of the right great toe while hospitalized for gangrene. The patient has known history of diabetes.  He is known to have peripheral arterial disease. Angiography in November, 2017 showed no significant aortoiliac disease, significant right distal SFA stenosis and one-vessel runoff below the knee via the peroneal artery with reconstitution of the dorsalis pedis distally. I performed successful drug-coated balloon angioplasty of the right SFA. He is s/p right transmetatarsal amputation for osteomyelitis.    He developed a small ulceration between the fifth and fourth toes on the left side . The review was performed in January 2025 which showed significant stenosis in the proximal/mid SFA, moderate popliteal artery stenosis and one-vessel runoff below the knee via the peroneal artery which reconstituted the pedal arch distally.  I performed successful drug-coated balloon angioplasty to the left SFA.  Postprocedure ABI improved to normal but toe pressure was still moderately reduced at 40 mmHg.  He had an echocardiogram done in February which showed an EF of 40 to 45% with akinesis of inferior and inferoseptal wall.  There was mild mitral regurgitation.   Past Medical History:  Diagnosis Date   AKI (acute kidney injury) ([name])    With STEMI in 2017   Chronic systolic CHF  (congestive heart failure) ([name])    Depression    Diabetes mellitus without complication ([name])    Diabetic retinopathy ([name])    GERD (gastroesophageal reflux disease)    Hx of adenomatous colonic polyps 04/07/2018   Hyperlipidemia    Hypertension    Hypertensive retinopathy    Paroxysmal atrial fibrillation ([name])    Peripheral vascular disease ([name])    Pneumonia    Seizures ([name])    hx of as a child   STEMI (ST elevation myocardial infarction) ([name]) 2017    Past Surgical History:  Procedure Laterality Date   ABDOMINAL AORTOGRAM W/LOWER EXTREMITY N/A 09/19/2016   Procedure: Abdominal Aortogram w/Lower Extremity;  Surgeon: Wenona Hamilton, MD;  Location: [facility] IN[facility]ASIVE [facility];  Service: Cardiovascular;  Laterality: N/A;   ABDOMINAL AORTOGRAM W/LOWER EXTREMITY N/A 07/17/2023   Procedure: ABDOMINAL AORTOGRAM W/LOWER EXTREMITY;  Surgeon: Wenona Hamilton, MD;  Location: [facility] CV [facility];  Service: Cardiovascular;  Laterality: N/A;   AMPUTATION Right 03/23/2016   Procedure: 1st and 2nd Ray Amputation Right Foot;  Surgeon: Timothy Ford, MD;  Location: [facility] OR;  Service: Orthopedics;  Laterality: Right;   AMPUTATION Right 06/21/2016   Procedure: RIGHT TRANSMETATARSAL AMPUTATION;  Surgeon: Timothy Ford, MD;  Location: [facility] OR;  Service: Orthopedics;  Laterality: Right;   CARDIAC CATHETERIZATION N/A 03/13/2016   Procedure: Right/Left Heart Cath and Coronary Angiography;  Surgeon: Arnoldo Lapping, MD;  Location: [facility] INVASIVE CV LAB;  Service: Cardiovascular;  Laterality: N/A;   CARDIAC CATHETERIZATION N/A 03/13/2016   Procedure: IABP Insertion;  Surgeon: Arnoldo Lapping, MD;  Location[facility] INVASIVE  CV LAB;  Service: Cardiovascular;  Laterality: N/A;   CATARACT EXTRACTION     CATARACT EXTRACTION, BILATERAL     CERVICAL FUSION  1982, 1992   has had 3 neck surgeries from breaking his neck   CIRCUMCISION N/A 11/07/2021   Procedure: CIRCUMCISION ADULT;  Surgeon: Mallie Seal, MD;  Location: [facility] ORS;   Service: Urology;  Laterality: N/A;   CORONARY ARTERY BYPASS GRAFT N/A 03/13/2016   Procedure: CORONARY ARTERY BYPASS GRAFTING (CABG) x 1 (SVG to OM) with EVH from LEFT GREATER SAPHENOUS VEIN;  Surgeon: Heriberto London, MD;  Location: [facility] OR;  Service: Open Heart Surgery;  Laterality: N/A;   EYE SURGERY     LOWER EXTREMITY ANGIOGRAM  05/02/2016   Procedure: Lower Extremity Angiogram;  Surgeon: Wenona Hamilton, MD;  Location: [facility] CV LAB;  Service: Cardiovascular;;  Limited left femoral runoff right femoral runoff   PERIPHERAL VASCULAR BALLOON ANGIOPLASTY  07/17/2023   Procedure: PERIPHERAL VASCULAR BALLOON ANGIOPLASTY;  Surgeon: Wenona Hamilton, MD;  Location: [facility];  Service: Cardiovascular;;   PERIPHERAL VASCULAR CATHETERIZATION N/A 05/02/2016   Procedure: Abdominal Aortogram;  Surgeon: Wenona Hamilton, MD;  Location: [facility] INV[facility] CV LAB;  Service: Cardiovascular;  Laterality: N/A;   PERIPHERAL VASCULAR CATHETERIZATION Right 05/02/2016   Procedure: Peripheral Vascular Balloon Angioplasty;  Surgeon: Wenona Hamilton, MD;  Location: [facility]VAS[facility] CV LAB;  Service: Cardiovascular;  Laterality: Right;  SFA   TEE WITHOUT CARDIOVERSION N/A 03/13/2016   Procedure: TRANSESOPHAGEAL ECHOCARDIOGRAM (TEE);  Surgeon: Heriberto London, MD;  Location: [facility] OR;  Service: Open Heart Surgery;  Laterality: N/A;   VSD REPAIR N/A 03/13/2016   Procedure: VENTRICULAR SEPTAL DEFECT (VSD) REPAIR;  Surgeon: Heriberto London, MD;  Location: [facility] OR;  Service: Open Heart Surgery;  Laterality: N/A;     Current Outpatient Medications  Medication Sig Dispense Refill   acetaminophen  (TYLENOL ) 500 MG tablet Take 500 mg by mouth every 6 (six) hours as needed for moderate pain.     Blood Glucose Monitoring Suppl DEVI 1 each by Does not apply route in the morning, at noon, and at bedtime. May substitute to any manufacturer covered by patient's insurance. 1 each 1   carvedilol  (COREG ) 3.125 MG tablet TAKE 1  TABLET(3.125 MG) BY MOUTH TWICE DAILY WITH A MEAL 180 tablet 1   clopidogrel  (PLAVIX ) 75 MG tablet TAKE 1 TABLET(75 MG) BY MOUTH DAILY 30 tablet 8   Continuous Glucose Receiver (DEXCOM G7 RECEIVER) DEVI 1 Act by Does not apply route daily. 9 each 1   Continuous Glucose Sensor (DEXCOM G7 SENSOR) MISC INJECT 1 SENSOR EVERY 10 DAYS TO MONITOR BLOOD SUGAR 9 each 1   dapagliflozin  propanediol (FARXIGA ) 10 MG TABS tablet TAKE 1 TABLET(10 MG) BY MOUTH DAILY 90 tablet 1   gabapentin  (NEURONTIN ) 300 MG capsule TAKE 1 CAPSULE(300 MG) BY MOUTH TWICE DAILY 180 capsule 0   icosapent  Ethyl (VASCEPA ) 1 g capsule Take 2 capsules (2 g total) by mouth 2 (two) times daily. 360 capsule 1   insulin  glargine (LANTUS  SOLOSTAR) 100 UNIT/ML Solostar Pen Inject 40 Units into the skin at bedtime. 36 mL 1   Insulin  Pen Needle 31G X 8 MM MISC 1 each by Does not apply route 4 (four) times daily. 300 each 2   OZEMPIC, 0.25 OR 0.5 MG/DOSE, 2 MG/3ML SOPN Inject 0.25 mg into the skin once a week.     pantoprazole  (PROTONIX ) 40 MG tablet TAKE 1 TABLET(40 MG) BY MOUTH  TWICE DAILY BEFORE A MEAL 180 tablet 0   pramipexole  (MIRAPEX ) 0.125 MG tablet TAKE 1 TABLET(0.125 MG) BY MOUTH AT BEDTIME 90 tablet 1   rosuvastatin  (CRESTOR ) 40 MG tablet TAKE 1 TABLET BY MOUTH EVERY DAY 90 tablet 1   silver  sulfADIAZINE  (SILVADENE ) 1 % cream Apply 1 Application topically daily. (Patient taking differently: Apply 1 Application topically daily as needed (Wound).) 400 g 2   torsemide  (DEMADEX ) 20 MG tablet Take 1 tablet (20 mg total) by mouth every other day. TAKE 1 TABLET(20 MG) BY MOUTH 3 TIMES A WEEK Monday, Wednesday and Friday (Patient taking differently: Take 20 mg by mouth every Monday, Wednesday, and Friday.) 38 tablet 0   warfarin (COUMADIN ) 5 MG tablet TAKE 1 TO 1 AND 1/2 TABLETS BY MOUTH DAILY AS DIRECTED BY COUMADIN  CLINIC 120 tablet 1   No current facility-administered medications for this visit.    Allergies:   Morphine and codeine  and  Latex    Social History:  The patient  reports that he quit smoking about 8 years ago. His smoking use included cigarettes. He has never used smokeless tobacco. He reports that he does not drink alcohol and does not use drugs.   Family History:  Not able to obtain due to distress.  ROS:  Please see the history of present illness.   Otherwise, review of systems are positive for none.   All other systems are reviewed and negative.    PHYSICAL EXAM: VS:  There were no vitals taken for this visit. , BMI There is no height or weight on file to calculate BMI. GEN: Well nourished, well developed, in no acute distress  HEENT: normal  Neck: no JVD, carotid bruits, or masses Cardiac: RRR; no  rubs, or gallops, . No murmurs Respiratory:  clear to auscultation bilaterally, normal work of breathing GI: soft, nontender, nondistended, + BS MS: no deformity or atrophy  Skin: warm and dry, no rash Neuro:  Strength and sensation are intact Psych: euthymic mood, full affect Distal pulses are not palpable on the left side.  There is slight swelling.  There is a small ulceration between the fourth and fifth toes and a small ulceration at the base of the left big toe.   EKG:  EKG is  not ordered today.   Recent Labs: 08/19/2023: Pro B Natriuretic peptide (BNP) 238.0 10/01/2023: ALT 28; BUN 33; Creatinine, Ser 1.60; Hemoglobin 12.6; Platelets 109; Potassium 3.9; Sodium 141; TSH 0.655    Lipid Panel    Component Value Date/Time   CHOL 106 08/19/2023 1432   CHOL 90 (L) 01/05/2019 1123   TRIG 296.0 (H) 08/19/2023 1432   HDL 26.10 (L) 08/19/2023 1432   HDL 34 (L) 01/05/2019 1123   CHOLHDL 4 08/19/2023 1432   VLDL 59.2 (H) 08/19/2023 1432   LDLCALC 20 08/19/2023 1432   LDLCALC 24 01/05/2019 1123   LDLCALC 45 03/20/2017 1144   LDLDIRECT 54.0 11/05/2022 1141      Wt Readings from Last 3 Encounters:  11/07/23 177 lb 12.8 oz (80.6 kg)  08/20/23 188 lb 3.2 oz (85.4 kg)  08/19/23 185 lb 9.6 oz (84.2  kg)          No data to display             ASSESSMENT AND PLAN:  1.  Peripheral arterial disease :  He is status post drug-coated balloon angioplasty of the right SFA in 2017.  Most recently, he had left SFA drug-coated balloon angioplasty with  good results and improvement in ABI.  However, he continues to have a small ulceration between the fourth and fifth toes with significant discomfort. I referred him to the wound center. The plan is to discontinue Plavix  after few months.  2. Chronic systolic heart failure: He appears to be euvolemic.   He is no longer on losartan  due to low blood pressure and chronic kidney disease.  Most recent echo in 2018 showed an EF of 45 to 50%.  He appears to be euvolemic on current dose of torsemide  20 mg 3 times a week.  Continue small dose carvedilol  and Farxiga .  He is scheduled for a follow-up echocardiogram which was requested by Dr. Rochelle Chu.   3. Paroxysmal atrial fibrillation: He is maintaining in sinus rhythm.  He is on long-term anticoagulation with warfarin with no bleeding complications.  His INR has been therapeutic.  4. Coronary artery disease involving native coronary arteries without angina:  status post one-vessel CABG and VSD repair. Continue medical therapy.  5. Diabetes mellitus: Improved from before.  6. Hyperlipidemia: Recent lipid profile showed an LDL of 54.  Continue rosuvastatin .   Disposition:   Follow-up in 3 months.  Signed,  Antionette Kirks, MD  11/19/2023 9:53 AM    [facility]

## 2023-11-19 NOTE — Patient Instructions (Signed)
 Description   Start taking 1.5 tablets daily except for 1 tablet every Mondays and Fridays. Recheck INR in 2-3 weeks.  [facility] with [facility] [phone]

## 2023-11-19 NOTE — Telephone Encounter (Signed)
 Patient came in today, but left before being seen because it would conflict with other appointments transportation had  Rescheduled with APP for next month However, daughter states she really needs to speak with a nurse because another provider wants patient to discontinue a medication

## 2023-11-20 NOTE — Telephone Encounter (Signed)
 Patient's daughter has been made aware to hold the Carvedilol  for now

## 2023-11-20 NOTE — Telephone Encounter (Signed)
 [facility] MD is trying to take patient off of Carvedilol  d/t low BP, according to daughter.  She is not sure what the BP was, but is VERY nervous about stopping any "heart medication".  States "Dr. Alvenia Aus saved my father's life and I will not stop anything unless he says it is ok.". Informed dtr that if low BP, then most likely ok to hold BB and see how BP responds.  Informed that she should be monitoring his BP.  She stated she just bought a wrist cuff for pt and trying to learn how to use it.  I called and spoke to [facility] -- pts BP there on 5/12 (they stated that was the last OV) was 100/54.  Forwarding to MD for review/advisement.

## 2023-11-20 NOTE — Telephone Encounter (Signed)
 He can hold Coreg  for now.  He is only on a small dose.

## 2023-11-20 NOTE — Telephone Encounter (Signed)
Left a message for the patient's daughter to call back.  

## 2023-11-20 NOTE — Telephone Encounter (Signed)
 Pt was returning nurse call and is requesting a callback. Please advise.

## 2023-11-22 ENCOUNTER — Other Ambulatory Visit: Payer: Self-pay | Admitting: Internal Medicine

## 2023-11-22 DIAGNOSIS — K219 Gastro-esophageal reflux disease without esophagitis: Secondary | ICD-10-CM

## 2023-11-28 DIAGNOSIS — R3121 Asymptomatic microscopic hematuria: Secondary | ICD-10-CM | POA: Diagnosis not present

## 2023-11-28 NOTE — Progress Notes (Signed)
 Cardiology Office Note:  .   Date:  12/10/2023  ID:  William [name], DOB 06/17/1951, MRN [id number] PCP: Arcadio Knuckles, MD  [facility] Providers Cardiologist:  Antionette Kirks, MD [facility] Cardiologist:  Antionette Kirks, MD    History of Present Illness: .   William [name] is a 73 y.o. male history of CAD s/p inferior myocardial infarction complicated by VSD in September, 2017. He is status post one-vessel CABG and VSD repair. He had postoperative atrial fibrillation and has been on anticoagulation. He had amputation of the right great toe while hospitalized for gangrene. The patient has known history of diabetes.  He is known to have peripheral arterial disease. Angiography in November, 2017 showed no significant aortoiliac disease, significant right distal SFA stenosis and one-vessel runoff below the knee via the peroneal artery with reconstitution of the dorsalis pedis distally. I performed successful drug-coated balloon angioplasty of the right SFA. He is s/p right transmetatarsal amputation for osteomyelitis.    Angiography  1/2025which showed significant stenosis in the proximal/mid SFA, moderate popliteal artery stenosis and one-vessel runoff below the knee via the peroneal artery which reconstituted the pedal arch distally. He had successful drug-coated balloon angioplasty to the left SFA.  Postprocedure ABI improved to normal but toe pressure was still moderately reduced at 40 mmHg.  Renal stopped coreg  due to hypotension. Demadex  reduced to twice weekly. Just got out of hospital([facility]) with sepsis & pneumonia of both lungs treated with 4 days ceftriaxone  & doxy.Afib with RVR felt due to sepsis and HR controlled at discharge. Denies chest pain, palpitations, edema, dizziness. Still having some left leg pain at night. Upper leg down to his toes. Getting better. No regular exercise. Walks to [employer] and does household chores.      ROS:    Studies Reviewed: Aaron Aas         Prior CV Studies:       Risk Assessment/Calculations:    CHA2DS2-VASc Score = 4   This indicates a 4.8% annual risk of stroke. The patient's score is based upon: CHF History: 0 HTN History: 1 Diabetes History: 1 Stroke History: 0 Vascular Disease History: 1 Age Score: 1 Gender Score: 0            Physical Exam:   VS:  BP 122/82   Pulse 82   Ht 5\' 5"  (1.651 m)   Wt 181 lb (82.1 kg)   SpO2 98%   BMI 30.12 kg/m    Wt Readings from Last 3 Encounters:  12/10/23 181 lb (82.1 kg)  11/07/23 177 lb 12.8 oz (80.6 kg)  08/20/23 188 lb 3.2 oz (85.4 kg)    GEN: Well nourished, well developed in no acute distress NECK: No JVD; No carotid bruits CARDIAC:  RRR, no murmurs, rubs, gallops RESPIRATORY:  Clear to auscultation without rales, wheezing or rhonchi  ABDOMEN: Soft, non-tender, non-distended EXTREMITIES:  No edema; No deformity feet warm, decrease pulses on palpation  ASSESSMENT AND PLAN: .    Coronary artery disease involving native coronary arteries without angina:  status post one-vessel CABG and VSD repair. No angina. Begin ASA 81 mg and stop plavix . -Continue medical therapy.   Peripheral arterial disease :  He is status post drug-coated balloon angioplasty of the right SFA in 2017.  07/2023 left SFA drug-coated balloon angioplasty with good results and improvement in ABI.   small ulceration between the fourth and fifth toes treated at  the wound center. The plan is to discontinue Plavix  after few months.  Discussed with Dr. Gwendalyn Lemma Plavix  and start ASA 81 mg daily    Chronic systolic heart failure: He appears to be euvolemic.   He is no longer on losartan  due to low blood pressure and chronic kidney disease.lasix  decreased to twice weekly by renal.  Most recent echo 08/2023 showed an EF of 45 to 50%.  Off coreg  due to hypotension. Continue to hold for now.   Paroxysmal atrial fibrillation: HR regular today. Had some Afib with RVR when in hospital for sepsis/pneumonia.  He is on long-term  anticoagulation with warfarin with no bleeding complications.  He is due for INR today as he missed yest appt with being in the hospital.    Diabetes mellitus: A1C 6.5    Hyperlipidemia: LDL 20 08/2023        Dispo: f/u Dr. Alvenia Aus in 4 months  Signed, Theotis Flake, PA-C

## 2023-11-29 ENCOUNTER — Other Ambulatory Visit: Payer: Self-pay | Admitting: Internal Medicine

## 2023-11-29 DIAGNOSIS — E119 Type 2 diabetes mellitus without complications: Secondary | ICD-10-CM

## 2023-12-06 DIAGNOSIS — Z7902 Long term (current) use of antithrombotics/antiplatelets: Secondary | ICD-10-CM | POA: Diagnosis not present

## 2023-12-06 DIAGNOSIS — I480 Paroxysmal atrial fibrillation: Secondary | ICD-10-CM | POA: Diagnosis not present

## 2023-12-06 DIAGNOSIS — K761 Chronic passive congestion of liver: Secondary | ICD-10-CM | POA: Diagnosis not present

## 2023-12-06 DIAGNOSIS — Z794 Long term (current) use of insulin: Secondary | ICD-10-CM | POA: Diagnosis not present

## 2023-12-06 DIAGNOSIS — I499 Cardiac arrhythmia, unspecified: Secondary | ICD-10-CM | POA: Diagnosis not present

## 2023-12-06 DIAGNOSIS — Z7985 Long-term (current) use of injectable non-insulin antidiabetic drugs: Secondary | ICD-10-CM | POA: Diagnosis not present

## 2023-12-06 DIAGNOSIS — Z20822 Contact with and (suspected) exposure to covid-19: Secondary | ICD-10-CM | POA: Diagnosis not present

## 2023-12-06 DIAGNOSIS — N1832 Chronic kidney disease, stage 3b: Secondary | ICD-10-CM | POA: Diagnosis not present

## 2023-12-06 DIAGNOSIS — Z89431 Acquired absence of right foot: Secondary | ICD-10-CM | POA: Diagnosis not present

## 2023-12-06 DIAGNOSIS — R918 Other nonspecific abnormal finding of lung field: Secondary | ICD-10-CM | POA: Diagnosis not present

## 2023-12-06 DIAGNOSIS — A419 Sepsis, unspecified organism: Secondary | ICD-10-CM | POA: Diagnosis not present

## 2023-12-06 DIAGNOSIS — I4892 Unspecified atrial flutter: Secondary | ICD-10-CM | POA: Diagnosis not present

## 2023-12-06 DIAGNOSIS — N281 Cyst of kidney, acquired: Secondary | ICD-10-CM | POA: Diagnosis not present

## 2023-12-06 DIAGNOSIS — Z87891 Personal history of nicotine dependence: Secondary | ICD-10-CM | POA: Diagnosis not present

## 2023-12-06 DIAGNOSIS — I959 Hypotension, unspecified: Secondary | ICD-10-CM | POA: Diagnosis not present

## 2023-12-06 DIAGNOSIS — E1122 Type 2 diabetes mellitus with diabetic chronic kidney disease: Secondary | ICD-10-CM | POA: Diagnosis not present

## 2023-12-06 DIAGNOSIS — Z7901 Long term (current) use of anticoagulants: Secondary | ICD-10-CM | POA: Diagnosis not present

## 2023-12-06 DIAGNOSIS — I700 Atherosclerosis of aorta: Secondary | ICD-10-CM | POA: Diagnosis not present

## 2023-12-06 DIAGNOSIS — Z9862 Peripheral vascular angioplasty status: Secondary | ICD-10-CM | POA: Diagnosis not present

## 2023-12-06 DIAGNOSIS — R0689 Other abnormalities of breathing: Secondary | ICD-10-CM | POA: Diagnosis not present

## 2023-12-06 DIAGNOSIS — Z7982 Long term (current) use of aspirin: Secondary | ICD-10-CM | POA: Diagnosis not present

## 2023-12-06 DIAGNOSIS — R7401 Elevation of levels of liver transaminase levels: Secondary | ICD-10-CM | POA: Diagnosis not present

## 2023-12-06 DIAGNOSIS — E1151 Type 2 diabetes mellitus with diabetic peripheral angiopathy without gangrene: Secondary | ICD-10-CM | POA: Diagnosis not present

## 2023-12-06 DIAGNOSIS — Z951 Presence of aortocoronary bypass graft: Secondary | ICD-10-CM | POA: Diagnosis not present

## 2023-12-06 DIAGNOSIS — Z79899 Other long term (current) drug therapy: Secondary | ICD-10-CM | POA: Diagnosis not present

## 2023-12-06 DIAGNOSIS — K8020 Calculus of gallbladder without cholecystitis without obstruction: Secondary | ICD-10-CM | POA: Diagnosis not present

## 2023-12-06 DIAGNOSIS — R509 Fever, unspecified: Secondary | ICD-10-CM | POA: Diagnosis not present

## 2023-12-06 DIAGNOSIS — I130 Hypertensive heart and chronic kidney disease with heart failure and stage 1 through stage 4 chronic kidney disease, or unspecified chronic kidney disease: Secondary | ICD-10-CM | POA: Diagnosis not present

## 2023-12-06 DIAGNOSIS — I5022 Chronic systolic (congestive) heart failure: Secondary | ICD-10-CM | POA: Diagnosis not present

## 2023-12-06 DIAGNOSIS — I454 Nonspecific intraventricular block: Secondary | ICD-10-CM | POA: Diagnosis not present

## 2023-12-06 DIAGNOSIS — N2889 Other specified disorders of kidney and ureter: Secondary | ICD-10-CM | POA: Diagnosis not present

## 2023-12-06 DIAGNOSIS — E785 Hyperlipidemia, unspecified: Secondary | ICD-10-CM | POA: Diagnosis not present

## 2023-12-06 DIAGNOSIS — N1831 Chronic kidney disease, stage 3a: Secondary | ICD-10-CM | POA: Diagnosis not present

## 2023-12-06 DIAGNOSIS — I2510 Atherosclerotic heart disease of native coronary artery without angina pectoris: Secondary | ICD-10-CM | POA: Diagnosis not present

## 2023-12-09 ENCOUNTER — Ambulatory Visit

## 2023-12-09 LAB — PROTIME-INR: INR: 1.6 — AB (ref 0.80–1.20)

## 2023-12-10 ENCOUNTER — Encounter: Payer: Self-pay | Admitting: Physician Assistant

## 2023-12-10 ENCOUNTER — Ambulatory Visit: Attending: Cardiology | Admitting: Physician Assistant

## 2023-12-10 ENCOUNTER — Telehealth: Payer: Self-pay | Admitting: *Deleted

## 2023-12-10 ENCOUNTER — Ambulatory Visit (INDEPENDENT_AMBULATORY_CARE_PROVIDER_SITE_OTHER): Payer: Self-pay | Admitting: Cardiology

## 2023-12-10 VITALS — BP 122/82 | HR 82 | Ht 65.0 in | Wt 181.0 lb

## 2023-12-10 DIAGNOSIS — N1832 Chronic kidney disease, stage 3b: Secondary | ICD-10-CM

## 2023-12-10 DIAGNOSIS — E785 Hyperlipidemia, unspecified: Secondary | ICD-10-CM | POA: Diagnosis not present

## 2023-12-10 DIAGNOSIS — I5022 Chronic systolic (congestive) heart failure: Secondary | ICD-10-CM | POA: Diagnosis not present

## 2023-12-10 DIAGNOSIS — I739 Peripheral vascular disease, unspecified: Secondary | ICD-10-CM

## 2023-12-10 DIAGNOSIS — I2510 Atherosclerotic heart disease of native coronary artery without angina pectoris: Secondary | ICD-10-CM | POA: Diagnosis not present

## 2023-12-10 DIAGNOSIS — E1122 Type 2 diabetes mellitus with diabetic chronic kidney disease: Secondary | ICD-10-CM

## 2023-12-10 DIAGNOSIS — I480 Paroxysmal atrial fibrillation: Secondary | ICD-10-CM | POA: Diagnosis not present

## 2023-12-10 DIAGNOSIS — Z5181 Encounter for therapeutic drug level monitoring: Secondary | ICD-10-CM | POA: Diagnosis not present

## 2023-12-10 MED ORDER — ASPIRIN 81 MG PO TBEC: 81.0000 mg | DELAYED_RELEASE_TABLET | Freq: Every day | ORAL | Status: AC

## 2023-12-10 NOTE — Patient Instructions (Signed)
 Medication Instructions:  Your physician has recommended you make the following change in your medication:  STOP PLAVIX  START Asprin 81 mg daily    *If you need a refill on your cardiac medications before your next appointment, please call your pharmacy*  Lab Work: NONE If you have labs (blood work) drawn today and your tests are completely normal, you will receive your results only by: MyChart Message (if you have MyChart) OR A paper copy in the mail If you have any lab test that is abnormal or we need to change your treatment, we will call you to review the results.  Testing/Procedures: NONE  Follow-Up: At [facility], you and your health needs are our priority.  As part of our continuing mission to provide you with exceptional heart care, our providers are all part of one team.  This team includes your primary Cardiologist (physician) and Advanced Practice Providers or APPs (Physician Assistants and Nurse Practitioners) who all work together to provide you with the care you need, when you need it.  Your next appointment:   4 month(s)  Provider:   Antionette Kirks, MD ONLY   We recommend signing up for the patient portal called "MyChart".  Sign up information is provided on this After Visit Summary.  MyChart is used to connect with patients for Virtual Visits (Telemedicine).  Patients are able to view lab/test results, encounter notes, upcoming appointments, etc.  Non-urgent messages can be sent to your provider as well.   To learn more about what you can do with MyChart, go to [email or web address].

## 2023-12-10 NOTE — Transitions of Care (Post Inpatient/ED Visit) (Signed)
   12/10/2023  Name: [name] MRN: [id number] DOB: 04-08-1951  Today's TOC FU Call Status: Today's TOC FU Call Status:: Unsuccessful Call (1st Attempt) Unsuccessful Call (1st Attempt) Date: 12/10/23  Attempted to reach the patient regarding the most recent Inpatient/ED visit.  Follow Up Plan: Additional outreach attempts will be made to reach the patient to complete the Transitions of Care (Post Inpatient/ED visit) call.   Una Ganser BSN RN [facility] Health Care Management Coordinator [email or web address] Direct Dial : [phone]  Fax: [phone] Website: [email or web address]

## 2023-12-10 NOTE — Progress Notes (Signed)
 [facility] - Clinic Note  12/23/2023    CHIEF COMPLAINT Patient presents for Retina Follow Up  HISTORY OF PRESENT ILLNESS: William [name] is a 73 y.o. male who presents to the clinic today for:   HPI     Retina Follow Up   Patient presents with  Diabetic Retinopathy.  In both eyes.  This started 3 years ago.  Severity is moderate.  Duration of 3 months.  Since onset it is stable.  I, the attending physician,  performed the HPI with the patient and updated documentation appropriately.        Comments   Patient here for 6 week retina follow up, NPDR OU. Patient states no changes in vision. Pt denies FOL/floaters/pain. Pt does not use ats. A1c=6.5 4 months ago. BS=138 this morning. Pt was taken off of carvedilol  because BP was getting too low. Pt is still taking ozempic. Last exam w/PCP went well.      Last edited by Valdemar Rogue, MD on 12/23/2023 11:58 PM.    Pt states vision is stable, his blood pressure and blood sugar have been doing well, kidneys are doing okay, he has been taken, he has taken off his daytime insulin  and only uses it at night, he also takes ozempic on thursdays  Referring physician: Joshua Debby CROME, MD [address] [place],  [place] [address]  HISTORICAL INFORMATION:  Selected notes from the MEDICAL RECORD NUMBER Referred by Dr. Lelon for eval of DME OU LEE:  Ocular Hx- PMH-    CURRENT MEDICATIONS: No current outpatient medications on file. (Ophthalmic Drugs)   No current facility-administered medications for this visit. (Ophthalmic Drugs)   Current Outpatient Medications (Other)  Medication Sig   acetaminophen  (TYLENOL ) 500 MG tablet Take 500 mg by mouth every 6 (six) hours as needed for moderate pain.   aspirin  EC 81 MG tablet Take 1 tablet (81 mg total) by mouth daily. Swallow whole.   Blood Glucose Monitoring Suppl DEVI 1 each by Does not apply route in the morning, at noon, and at bedtime. May substitute to any  manufacturer covered by patient's insurance.   Continuous Glucose Receiver (DEXCOM G7 RECEIVER) DEVI 1 Act by Does not apply route daily.   Continuous Glucose Sensor (DEXCOM G7 SENSOR) MISC INJECT 1 SENSOR EVERY 10 DAYS TO MONITOR BLOOD SUGAR   dapagliflozin  propanediol (FARXIGA ) 10 MG TABS tablet TAKE 1 TABLET(10 MG) BY MOUTH DAILY   gabapentin  (NEURONTIN ) 300 MG capsule TAKE 1 CAPSULE(300 MG) BY MOUTH TWICE DAILY   icosapent  Ethyl (VASCEPA ) 1 g capsule Take 2 capsules (2 g total) by mouth 2 (two) times daily.   Insulin  Pen Needle 31G X 8 MM MISC 1 each by Does not apply route 4 (four) times daily.   LANTUS  SOLOSTAR 100 UNIT/ML Solostar Pen ADMINISTER 40 UNITS UNDER THE SKIN AT BEDTIME   OZEMPIC, 0.25 OR 0.5 MG/DOSE, 2 MG/3ML SOPN Inject 0.25 mg into the skin once a week.   pantoprazole  (PROTONIX ) 40 MG tablet TAKE 1 TABLET(40 MG) BY MOUTH TWICE DAILY BEFORE A MEAL   pramipexole  (MIRAPEX ) 0.125 MG tablet TAKE 1 TABLET(0.125 MG) BY MOUTH AT BEDTIME   rosuvastatin  (CRESTOR ) 40 MG tablet TAKE 1 TABLET BY MOUTH EVERY DAY   silver  sulfADIAZINE  (SILVADENE ) 1 % cream Apply 1 Application topically daily. (Patient taking differently: Apply 1 Application topically daily as needed (Wound).)   torsemide  (DEMADEX ) 20 MG tablet Take 20 mg by mouth 2 (two) times a week.  warfarin (COUMADIN ) 5 MG tablet TAKE 1 TO 1 AND 1/2 TABLETS BY MOUTH DAILY AS DIRECTED BY COUMADIN  CLINIC   carvedilol  (COREG ) 3.125 MG tablet TAKE 1 TABLET(3.125 MG) BY MOUTH TWICE DAILY WITH A MEAL (Patient not taking: Reported on 12/23/2023)   torsemide  (DEMADEX ) 20 MG tablet Take 1 tablet (20 mg total) by mouth every other day. TAKE 1 TABLET(20 MG) BY MOUTH 3 TIMES A WEEK Monday, Wednesday and Friday (Patient not taking: Reported on 12/23/2023)   No current facility-administered medications for this visit. (Other)   REVIEW OF SYSTEMS: ROS   Positive for: Gastrointestinal, Genitourinary, Endocrine, Eyes Negative for: Constitutional,  Neurological, Skin, Musculoskeletal, HENT, Cardiovascular, Respiratory, Psychiatric, Allergic/Imm, Heme/Lymph Last edited by Elnor Avelina RAMAN, COT on 12/23/2023  8:02 AM.     ALLERGIES Allergies  Allergen Reactions   Morphine And Codeine  Shortness Of Breath and Other (See Comments)    UNSPECIFIED REACTION Pt said it was too much    Latex Rash   PAST MEDICAL HISTORY Past Medical History:  Diagnosis Date   AKI (acute kidney injury) ([name])    With STEMI in 2017   Chronic systolic CHF (congestive heart failure) ([name])    Depression    Diabetes mellitus without complication ([name])    Diabetic retinopathy ([name])    GERD (gastroesophageal reflux disease)    Hx of adenomatous colonic polyps 04/07/2018   Hyperlipidemia    Hypertension    Hypertensive retinopathy    Paroxysmal atrial fibrillation ([name])    Peripheral vascular disease ([name])    Pneumonia    Seizures ([name])    hx of as a child   STEMI (ST elevation myocardial infarction) ([name]) 2017   Past Surgical History:  Procedure Laterality Date   ABDOMINAL AORTOGRAM W/LOWER EXTREMITY N/A 09/19/2016   Procedure: Abdominal Aortogram w/Lower Extremity;  Surgeon: Deatrice DELENA Cage, MD;  Location: [facility];  Service: Cardiovascular;  Laterality: N/A;   ABDOMINAL AORTOGRAM W/LOWER EXTREMITY N/A 07/17/2023   Procedure: ABDOMINAL AORTOGRAM W/LOWER EXTREMITY;  Surgeon: Cage Deatrice DELENA, MD;  Location: [facility];  Service: Cardiovascular;  Laterality: N/A;   AMPUTATION Right 03/23/2016   Procedure: 1st and 2nd Ray Amputation Right Foot;  Surgeon: Jerona LULLA Sage, MD;  Location: [facility] OR;  Service: Orthopedics;  Laterality: Right;   AMPUTATION Right 06/21/2016   Procedure: RIGHT TRANSMETATARSAL AMPUTATION;  Surgeon: Jerona LULLA Sage, MD;  Location: [facility] OR;  Service: Orthopedics;  Laterality: Right;   CARDIAC CATHETERIZATION N/A 03/13/2016   Procedure: Right/Left Heart Cath and Coronary Angiography;  Surgeon: Ozell Fell, MD;   Location: [facility] INVASIVE CV LAB;  Service: Cardiovascular;  Laterality: N/A;   CARDIAC CATHETERIZATION N/A 03/13/2016   Procedure: IABP Insertion;  Surgeon: Ozell Fell, MD;  Location: [facility] INVASIVE CV LAB;  Service: Cardiovascular;  Laterality: N/A;   CATARACT EXTRACTION     CATARACT EXTRACTION, BILATERAL     CERVICAL FUSION  1982, 1992   has had 3 neck surgeries from breaking his neck   CIRCUMCISION N/A 11/07/2021   Procedure: CIRCUMCISION ADULT;  Surgeon: Lovie Arlyss CROME, MD;  Location: [facility] ORS;  Service: Urology;  Laterality: N/A;   CORONARY ARTERY BYPASS GRAFT N/A 03/13/2016   Procedure: CORONARY ARTERY BYPASS GRAFTING (CABG) x 1 (SVG to OM) with EVH from LEFT GREATER SAPHENOUS VEIN;  Surgeon: Maude Fleeta Ochoa, MD;  Location: [facility] OR;  Service: Open Heart Surgery;  Laterality: N/A;   EYE SURGERY     LOWER EXTREMITY ANGIOGRAM  05/02/2016  Procedure: Lower Extremity Angiogram;  Surgeon: Deatrice DELENA Cage, MD;  Location: [facility];  Service: Cardiovascular;;  Limited left femoral runoff right femoral runoff   PERIPHERAL VASCULAR BALLOON ANGIOPLASTY  07/17/2023   Procedure: PERIPHERAL VASCULAR BALLOON ANGIOPLASTY;  Surgeon: Cage Deatrice DELENA, MD;  Location: [facility];  Service: Cardiovascular;;   PERIPHERAL VASCULAR CATHETERIZATION N/A 05/02/2016   Procedure: Abdominal Aortogram;  Surgeon: Deatrice DELENA Cage, MD;  Location: [facility];  Service: Cardiovascular;  Laterality: N/A;   PERIPHERAL VASCULAR CATHETERIZATION Right 05/02/2016   Procedure: Peripheral Vascular Balloon Angioplasty;  Surgeon: Deatrice DELENA Cage, MD;  Location: [facility];  Service: Cardiovascular;  Laterality: Right;  SFA   TEE WITHOUT CARDIOVERSION N/A 03/13/2016   Procedure: TRANSESOPHAGEAL ECHOCARDIOGRAM (TEE);  Surgeon: Maude Fleeta Ochoa, MD;  Location: [facility] OR;  Service: Open Heart Surgery;  Laterality: N/A;   VSD REPAIR N/A 03/13/2016   Procedure: VENTRICULAR SEPTAL DEFECT (VSD) REPAIR;  Surgeon:  Maude Fleeta Ochoa, MD;  Location: [facility] OR;  Service: Open Heart Surgery;  Laterality: N/A;   FAMILY HISTORY Family History  Problem Relation Age of Onset   Diabetes Maternal Grandmother    Diabetes Mother    Aneurysm Mother    Peripheral Artery Disease Mother    Coronary artery disease Mother    Peptic Ulcer Father    Retinoblastoma Daughter    Colon cancer Neg Hx    Rectal cancer Neg Hx    SOCIAL HISTORY Social History   Tobacco Use   Smoking status: Former    Current packs/day: 0.00    Types: Cigarettes    Quit date: 11/20/2015    Years since quitting: 8.0   Smokeless tobacco: Never   Tobacco comments:    quit 2018  Vaping Use   Vaping status: Never Used  Substance Use Topics   Alcohol use: No   Drug use: No       OPHTHALMIC EXAM: Base Eye Exam     Visual Acuity (Snellen - Linear)       Right Left   Dist [place] 20/30 +1 20/25 -1   Dist ph [place] 20/25 +2 20/20 -2         Tonometry (Tonopen, 8:00 AM)       Right Left   Pressure 19 11         Pupils       Pupils Dark Light Shape React APD   Right PERRL 2 1 Round Brisk None   Left PERRL 2 1 Round Brisk None         Visual Fields       Left Right    Full Full         Extraocular Movement       Right Left    Full, Ortho Full, Ortho         Neuro/Psych     Oriented x3: Yes   Mood/Affect: Normal         Dilation     Both eyes: 1.0% Mydriacyl, 2.5% Phenylephrine  @ 8:01 AM           Slit Lamp and Fundus Exam     Slit Lamp Exam       Right Left   Lids/Lashes Dermatochalasis - upper lid, mild MGD Dermatochalasis - upper lid, mild MGD   Conjunctiva/Sclera White and quiet White and quiet   Cornea trace PEE, well healed cataract wound, mild tear film debris 1+ fine PEE, mild tear film  debris, well healed cataract wound   Anterior Chamber Deep and quiet Deep and quiet   Iris Round and dilated, No NVI Round and dilated, No NVI   Lens PC IOL in good position PC IOL in good position    Anterior Vitreous Vitreous syneresis, vitreous condensations Vitreous syneresis, Posterior vitreous detachment, vitreous condensations         Fundus Exam       Right Left   Disc trace Pallor, Sharp rim mild Pallor, Sharp rim, mild tilt   C/D Ratio 0.3 0.3   Macula Flat, good foveal reflex, scatted Microaneurysms, cystic changes superior macula -- stably improved; new cystic changes inferior macula, good focal laser changes superiorly, good focal laser targets inferior macula Flat, good foveal reflex, scattered MA -- stably improved, stable improvement in cystic changes temporal mac, good focal laser changes, single prominent MA ST to fovea   Vessels attenuated, mild tortuosity, no NV attenuated, Tortuous   Periphery Attached, +MA greatest posteriorly Attached, scattered MA greatest posteriorly           IMAGING AND PROCEDURES  Imaging and Procedures for 12/23/2023  OCT, Retina - OU - Both Eyes       Right Eye Quality was good. Central Foveal Thickness: 301. Progression has worsened. Findings include no SRF, abnormal foveal contour, intraretinal hyper-reflective material, intraretinal fluid, vitreomacular adhesion (Interval increase in IRF/cystic changes inferior fovea and mac ).   Left Eye Quality was good. Central Foveal Thickness: 263. Progression has been stable. Findings include normal foveal contour, no SRF, intraretinal hyper-reflective material, intraretinal fluid (Stable improvement in IRF/ cystic changes temporal macula -- trace cystic changes remain).   Notes *Images captured and stored on drive  Diagnosis / Impression:  +DME OU OD:  Interval increase in IRF/cystic changes inferior fovea and mac  OS: Stable improvement in IRF/ cystic changes temporal macula -- trace cystic changes remain  Clinical management:  See below  Abbreviations: NFP - Normal foveal profile. CME - cystoid macular edema. PED - pigment epithelial detachment. IRF - intraretinal fluid. SRF -  subretinal fluid. EZ - ellipsoid zone. ERM - epiretinal membrane. ORA - outer retinal atrophy. ORT - outer retinal tubulation. SRHM - subretinal hyper-reflective material. IRHM - intraretinal hyper-reflective material      Intravitreal Injection, Pharmacologic Agent - OD - Right Eye       Time Out 12/23/2023. 8:46 AM. Confirmed correct patient, procedure, site, and patient consented.   Anesthesia Topical anesthesia was used. Anesthetic medications included Lidocaine  2%, Proparacaine 0.5%.   Procedure Preparation included 5% betadine  to ocular surface, eyelid speculum. A (32g) needle was used.   Injection: 1.25 mg Bevacizumab  1.25mg /0.52ml   Route: Intravitreal, Site: Right Eye   NDC: [id number], Lot: [id number], Expiration date: 03/05/2024   Post-op Post injection exam found visual acuity of at least counting fingers. The patient tolerated the procedure well. There were no complications. The patient received written and verbal post procedure care education. Post injection medications were not given.      POCT INR      Component Value Flag Ref Range Units Status   INR 2.4      2.0 - 3.0  Final   POC INR                          ASSESSMENT/PLAN:    ICD-10-CM   1. Moderate nonproliferative diabetic retinopathy of both eyes with macular edema associated with type 2 diabetes mellitus ([name])  E11.3313 OCT, Retina - OU - Both Eyes    Intravitreal Injection, Pharmacologic Agent - OD - Right Eye    Bevacizumab  (AVASTIN ) SOLN 1.25 mg    2. Current use of insulin  ([name])  Z79.4     3. Long-term (current) use of injectable non-insulin  antidiabetic drugs  Z79.85     4. Essential hypertension  I10     5. Hypertensive retinopathy of both eyes  H35.033     6. Pseudophakia, both eyes  Z96.1      1-3. Moderate non-proliferative diabetic retinopathy, both eyes  - last A1c was 6.5 on 02.17.25  - s/p IVA OD #1 (11.14.22), #2 (12.12.22), #3 (01.09.23)  - s/p IVA OS #1 (10.17.22), #2  (11.14.22), #3 (12.12.22), #4 (01.09.23), #5 (03.15.23), #6 (04.12.23)  ============================ - s/p IVE OD #1 (03.15.23), #2 (04.12.23), #3 (05.10.23), #4 (06.07.23), #5 (07.05.23), #6 (08.02.23), #7 (08.31.23), #8 (09.28.23), #9 (10.30.23), #10 (11.28.23), #11 (12.28.23), #12 (01.29.24), #13 (02.20.24), #14 (03.25.24), #15 (04.22.24), #16 (05.20.24), #17 (06.17.24), #18 (07.22.24), #19 (08.26.24), #20 (09.30.24), #21 (11.04.24), #22 (12.16.24) - s/p IVE OS #1 (05.10.23), #2 (06.07.23), #3 (07.05.23), #4 (08.02.23), #5 (08.31.23), #6 (09.28.23), #7 (10.30.23), #8 (11.28.23), #9 (12.28.23), #10 (01.29.24), #11 (02.20..24), #12 (03.25.24), #13 (04.22.24), #14 (05.20.24), #15 (06.17.24), #16 (07.22.24), #17 (09.30.24), #18 (11.04.24), #19 (12.16.24) ============================== - s/p focal laser OS (06.25.24) - s/p focal laser OD (01.13.25) - exam shows scattered MA, DBH OU - FA (10.17.22) shows late leaking MA OU, no NV OU - OCT shows OD: Interval increase in IRF/cystic changes superior fovea and mac; OS: Stable improvement in IRF/ cystic changes temporal macula -- trace cystic changes remain at 6 mos since last injections - BCVA OD 20/25 - stable; OS 20/20 from 20/25 - recommend IVA OD #4 today, 06.23.25 w/ f/u in 6 wks ([employer] funding unavailable) - pt wishes to proceed with injection - RBA of procedure discussed, questions answered - IVA informed consent obtained and signed, 06.23.25 - see procedure note - f/u 6 weeks, sooner prn -- DFE, OCT, FA transit OD, possible injection(s)  4,5. Hypertensive retinopathy OU - discussed importance of tight BP control  - monitor  6. Pseudophakia OU  - s/p CE/IOL OU  - IOL in good position, doing well  - monitor  Ophthalmic Meds Ordered this visit:  Meds ordered this encounter  Medications   Bevacizumab  (AVASTIN ) SOLN 1.25 mg     Return in about 6 weeks (around 02/03/2024) for f/u NPDR OU, DFE, OCT, Possible Injxn.  There are no  Patient Instructions on file for this visit.  Explained the diagnoses, plan, and follow up with the patient and they expressed understanding.  Patient expressed understanding of the importance of proper follow up care.   This document serves as a record of services personally performed by Redell JUDITHANN Hans, MD, PhD. It was created on their behalf by Avelina Pereyra, COA an ophthalmic technician. The creation of this record is the provider's dictation and/or activities during the visit.   Electronically signed by: Avelina GORMAN Pereyra, COT  12/24/23  12:00 AM   This document serves as a record of services personally performed by Redell JUDITHANN Hans, MD, PhD. It was created on their behalf by Alan PARAS. Delores, OA an ophthalmic technician. The creation of this record is the provider's dictation and/or activities during the visit.    Electronically signed by: Alan PARAS. Delores, OA 12/24/23 12:00 AM  Redell JUDITHANN Hans, M.D., Ph.D. Diseases & Surgery of the Retina and  Vitreous [facility]  I have reviewed the above documentation for accuracy and completeness, and I agree with the above. Redell JUDITHANN Hans, M.D., Ph.D. 12/24/23 12:03 AM   Abbreviations: M myopia (nearsighted); A astigmatism; H hyperopia (farsighted); P presbyopia; Mrx spectacle prescription;  CTL contact lenses; OD right eye; OS left eye; OU both eyes  XT exotropia; ET esotropia; PEK punctate epithelial keratitis; PEE punctate epithelial erosions; DES dry eye syndrome; MGD meibomian gland dysfunction; ATs artificial tears; PFAT's preservative free artificial tears; NSC nuclear sclerotic cataract; PSC posterior subcapsular cataract; ERM epi-retinal membrane; PVD posterior vitreous detachment; RD retinal detachment; DM diabetes mellitus; DR diabetic retinopathy; NPDR non-proliferative diabetic retinopathy; PDR proliferative diabetic retinopathy; CSME clinically significant macular edema; DME diabetic macular edema; dbh dot blot  hemorrhages; CWS cotton wool spot; POAG primary open angle glaucoma; C/D cup-to-disc ratio; HVF humphrey visual field; GVF goldmann visual field; OCT optical coherence tomography; IOP intraocular pressure; BRVO Branch retinal vein occlusion; CRVO central retinal vein occlusion; CRAO central retinal artery occlusion; BRAO branch retinal artery occlusion; RT retinal tear; SB scleral buckle; PPV pars plana vitrectomy; VH Vitreous hemorrhage; PRP panretinal laser photocoagulation; IVK intravitreal kenalog; VMT vitreomacular traction; MH Macular hole;  NVD neovascularization of the disc; NVE neovascularization elsewhere; AREDS age related eye disease study; ARMD age related macular degeneration; POAG primary open angle glaucoma; EBMD epithelial/anterior basement membrane dystrophy; ACIOL anterior chamber intraocular lens; IOL intraocular lens; PCIOL posterior chamber intraocular lens; Phaco/IOL phacoemulsification with intraocular lens placement; PRK photorefractive keratectomy; LASIK laser assisted in situ keratomileusis; HTN hypertension; DM diabetes mellitus; COPD chronic obstructive pulmonary disease

## 2023-12-11 ENCOUNTER — Telehealth: Payer: Self-pay | Admitting: *Deleted

## 2023-12-11 NOTE — Transitions of Care (Post Inpatient/ED Visit) (Signed)
   12/11/2023  Name: William [name] MRN: [id number] DOB: March 29, 1951  Today's TOC FU Call Status: Today's TOC FU Call Status:: Unsuccessful Call (2nd Attempt)  Attempted to reach the patient regarding the most recent Inpatient/ED visit.  Follow Up Plan: Additional outreach attempts will be made to reach the patient to complete the Transitions of Care (Post Inpatient/ED visit) call.   Una Ganser BSN RN [facility] Care Management Coordinator [email or web address] Direct Dial : [phone]  Fax: [phone] Website: [email or web address]

## 2023-12-12 ENCOUNTER — Telehealth: Payer: Self-pay

## 2023-12-12 NOTE — Transitions of Care (Post Inpatient/ED Visit) (Signed)
   12/12/2023  Name: William [name] MRN: [id number] DOB: [birth year]  Today's TOC FU Call Status: Today's TOC FU Call Status:: Unsuccessful Call (3rd Attempt) Unsuccessful Call (3rd Attempt) Date: 12/12/23  Attempted to reach the patient regarding the most recent Inpatient/ED visit.  Follow Up Plan: No further outreach attempts will be made at this time. We have been unable to contact the patient.  Lari Linson J. Miciah Shealy RN, MSN [facility], [facility] RN Care Manager Direct Dial : [phone]  Fax: [phone] Website: [email or web address]

## 2023-12-23 ENCOUNTER — Ambulatory Visit (INDEPENDENT_AMBULATORY_CARE_PROVIDER_SITE_OTHER): Admitting: Ophthalmology

## 2023-12-23 ENCOUNTER — Encounter (INDEPENDENT_AMBULATORY_CARE_PROVIDER_SITE_OTHER): Payer: Self-pay | Admitting: Ophthalmology

## 2023-12-23 ENCOUNTER — Ambulatory Visit: Attending: Cardiovascular Disease

## 2023-12-23 DIAGNOSIS — E113313 Type 2 diabetes mellitus with moderate nonproliferative diabetic retinopathy with macular edema, bilateral: Secondary | ICD-10-CM

## 2023-12-23 DIAGNOSIS — Z961 Presence of intraocular lens: Secondary | ICD-10-CM

## 2023-12-23 DIAGNOSIS — H35033 Hypertensive retinopathy, bilateral: Secondary | ICD-10-CM

## 2023-12-23 DIAGNOSIS — I10 Essential (primary) hypertension: Secondary | ICD-10-CM

## 2023-12-23 DIAGNOSIS — I480 Paroxysmal atrial fibrillation: Secondary | ICD-10-CM

## 2023-12-23 DIAGNOSIS — Z794 Long term (current) use of insulin: Secondary | ICD-10-CM

## 2023-12-23 DIAGNOSIS — Z5181 Encounter for therapeutic drug level monitoring: Secondary | ICD-10-CM

## 2023-12-23 DIAGNOSIS — Z7985 Long-term (current) use of injectable non-insulin antidiabetic drugs: Secondary | ICD-10-CM

## 2023-12-23 LAB — POCT INR: INR: 2.4 (ref 2.0–3.0)

## 2023-12-23 MED ORDER — BEVACIZUMAB CHEMO INJECTION 1.25MG/0.05ML SYRINGE FOR KALEIDOSCOPE
1.2500 mg | INTRAVITREAL | Status: AC | PRN
  Administered 2023-12-23: 1.25 mg via INTRAVITREAL

## 2023-12-23 NOTE — Patient Instructions (Signed)
 continue taking 1.5 tablets daily except for 1 tablet every Mondays and Fridays. Recheck INR in 4 weeks.  [facility] with [facility] [phone]

## 2023-12-23 NOTE — Progress Notes (Signed)
Please see anticoagulation encounter.

## 2023-12-25 ENCOUNTER — Inpatient Hospital Stay: Admitting: Internal Medicine

## 2023-12-25 ENCOUNTER — Ambulatory Visit: Payer: Self-pay | Admitting: *Deleted

## 2023-12-25 NOTE — Telephone Encounter (Signed)
 Copied from CRM [id number]. Topic: Clinical - Red Word Triage >> Dec 25, 2023  8:42 AM Treva T wrote: Red Word that prompted transfer to Nurse Triage: Patient daughter, Rosaline, [place] verified, states patient has an increases of bad cough, just recently diagnosed with pneumonia, and is concerned of increase of coughing again. Reason for Disposition  Cough has been present for > 3 weeks    Pt was rescheduled for hospital follow up for January 14, 2024.   Had transportation issues and could not get in as scheduled.  Answer Assessment - Initial Assessment Questions 1. ONSET: When did the cough begin?      Daughter, Rosaline Abu calling in, on [place]. He had pneumonia and was in the hospital.   He was discharged on the 9th.    His cough is not going away.   I've had him drink hot water to break up the mucus.   It's not coming up when he coughs.   I had to cancel his appt due to transportation issues.  It was rescheduled for July. His cough was better when he got out of the hospital.   He still has that cough.   He is not c/o shortness of breath or wheezing.   He is not c/o the cough.   I just feel bad due to him coughing.   No fever.     I mentioned cough and the lady transferred me to you.    I just wanted to reschedule his appt which was done.  So not sure why I was transferred to you.   I let her know because she mentioned her father was coughing.  He has chronic sinus issues.   He doesn't believe he does.   It's just normal for him.   I'm wondering if that's causing him to cough too.      2. SEVERITY: How bad is the cough today?      It's just not going away.  I just needed to reschedule his hospital follow up appt which the lady has done.   I don't need anything else.  I made her aware of signs/symptoms to watch for:  increased coughing, coughing up thick mucus of any color, fever, chills, fatigue, not feeling well, shortness of breath, wheezing.   Pt lives with daughter.   He is still asleep right  now.  He can't read and write.   He is from [place] .  He is able bodied but he can't read and sign papers.   The hospital won't tell me anything.    My parents live with me.   I set out his pills for him.   If I set stuff out for him he takes his pills as he should.   He just doesn't understand all this hospital/medical stuff.   I have to help him with all that.   He is able bodied and takes his pills and all as he should but I have to sit them out for him.  3. SPUTUM: Describe the color of your sputum (none, dry cough; clear, white, yellow, green)     He is not getting anything up but he is not having shortness of breath or wheezing. 4. HEMOPTYSIS: Are you coughing up any blood? If so ask: How much? (flecks, streaks, tablespoons, etc.)     No 5. DIFFICULTY BREATHING: Are you having difficulty breathing? If Yes, ask: How bad is it? (e.g., mild, moderate, severe)    - MILD: No SOB at  rest, mild SOB with walking, speaks normally in sentences, can lie down, no retractions, pulse < 100.    - MODERATE: SOB at rest, SOB with minimal exertion and prefers to sit, cannot lie down flat, speaks in phrases, mild retractions, audible wheezing, pulse 100-120.    - SEVERE: Very SOB at rest, speaks in single words, struggling to breathe, sitting hunched forward, retractions, pulse > 120      No shortness of breath or wheezing.   6. FEVER: Do you have a fever? If Yes, ask: What is your temperature, how was it measured, and when did it start?     No fever 7. CARDIAC HISTORY: Do you have any history of heart disease? (e.g., heart attack, congestive heart failure)      Not asked 8. LUNG HISTORY: Do you have any history of lung disease?  (e.g., pulmonary embolus, asthma, emphysema)     Was discharged from the hospital with pneumonia on the 9th.  He is still coughing.  It's not worse or better. 9. PE RISK FACTORS: Do you have a history of blood clots? (or: recent major surgery, recent prolonged  travel, bedridden)     Not asked 10. OTHER SYMPTOMS: Do you have any other symptoms? (e.g., runny nose, wheezing, chest pain)       Chronic sinus issues.   It's just his normal but I've wondered if that's making him cough.  She asked what medication he could take for his sinuses that go along with his heart medications and all.   I directed her to consult his pharmacist.  She was agreeable to doing that. 11. PREGNANCY: Is there any chance you are pregnant? When was your last menstrual period?       N/A 12. TRAVEL: Have you traveled out of the country in the last month? (e.g., travel history, exposures)       N/A  Protocols used: Cough - Acute Productive-A-AH FYI Only or Action Required?: FYI only for provider.  Patient was last seen in primary care on 11/07/2023 by Joshua Debby CROME, MD. Called Nurse Triage reporting Cough. Symptoms began several weeks ago. Interventions attempted: Other: was in hospital with pneumonia  Hospital follow up appt had to be rescheduled due to transportation issues. Symptoms are: unchanged.  Triage Disposition: See PCP Within 2 Weeks  Patient/caregiver understands and will follow disposition?: Yes

## 2023-12-28 ENCOUNTER — Other Ambulatory Visit: Payer: Self-pay | Admitting: Internal Medicine

## 2024-01-07 ENCOUNTER — Telehealth: Payer: Self-pay | Admitting: Cardiovascular Disease

## 2024-01-07 MED ORDER — WARFARIN SODIUM 5 MG PO TABS: ORAL_TABLET | ORAL | 1 refills | Status: DC

## 2024-01-07 NOTE — Telephone Encounter (Signed)
 Refill request for warfarin:  Last INR was 2.4 on 12/23/23 Next INR due 01/20/24 LOV was 12/10/23  Refill approved.

## 2024-01-07 NOTE — Telephone Encounter (Signed)
*  STAT* If patient is at the pharmacy, call can be transferred to refill team.   1. Which medications need to be refilled? (please list name of each medication and dose if known) warfarin (COUMADIN ) 5 MG tablet   2. Which pharmacy/location (including street and city if local pharmacy) is medication to be sent to? [id number] - [place], [place] - [address]   3. Do they need a 30 day or 90 day supply? 90   Patient was out of medication

## 2024-01-14 ENCOUNTER — Other Ambulatory Visit (INDEPENDENT_AMBULATORY_CARE_PROVIDER_SITE_OTHER): Payer: Self-pay

## 2024-01-14 ENCOUNTER — Encounter: Payer: Self-pay | Admitting: Internal Medicine

## 2024-01-14 ENCOUNTER — Ambulatory Visit (INDEPENDENT_AMBULATORY_CARE_PROVIDER_SITE_OTHER): Admitting: Internal Medicine

## 2024-01-14 ENCOUNTER — Ambulatory Visit (INDEPENDENT_AMBULATORY_CARE_PROVIDER_SITE_OTHER)

## 2024-01-14 VITALS — BP 104/68 | HR 82 | Temp 97.6°F | Resp 16 | Ht 65.0 in | Wt 175.0 lb

## 2024-01-14 DIAGNOSIS — D539 Nutritional anemia, unspecified: Secondary | ICD-10-CM | POA: Insufficient documentation

## 2024-01-14 DIAGNOSIS — I700 Atherosclerosis of aorta: Secondary | ICD-10-CM | POA: Diagnosis not present

## 2024-01-14 DIAGNOSIS — I950 Idiopathic hypotension: Secondary | ICD-10-CM

## 2024-01-14 DIAGNOSIS — N1832 Chronic kidney disease, stage 3b: Secondary | ICD-10-CM

## 2024-01-14 DIAGNOSIS — R059 Cough, unspecified: Secondary | ICD-10-CM | POA: Diagnosis not present

## 2024-01-14 DIAGNOSIS — I10 Essential (primary) hypertension: Secondary | ICD-10-CM

## 2024-01-14 DIAGNOSIS — Z794 Long term (current) use of insulin: Secondary | ICD-10-CM

## 2024-01-14 DIAGNOSIS — E119 Type 2 diabetes mellitus without complications: Secondary | ICD-10-CM

## 2024-01-14 DIAGNOSIS — Z961 Presence of intraocular lens: Secondary | ICD-10-CM

## 2024-01-14 DIAGNOSIS — H35033 Hypertensive retinopathy, bilateral: Secondary | ICD-10-CM

## 2024-01-14 DIAGNOSIS — R052 Subacute cough: Secondary | ICD-10-CM | POA: Diagnosis not present

## 2024-01-14 DIAGNOSIS — E113313 Type 2 diabetes mellitus with moderate nonproliferative diabetic retinopathy with macular edema, bilateral: Secondary | ICD-10-CM

## 2024-01-14 DIAGNOSIS — Z7985 Long-term (current) use of injectable non-insulin antidiabetic drugs: Secondary | ICD-10-CM

## 2024-01-14 LAB — IBC + FERRITIN
Ferritin: 81.8 ng/mL (ref 22.0–322.0)
Iron: 76 ug/dL (ref 42–165)
Saturation Ratios: 21 % (ref 20.0–50.0)
TIBC: 362.6 ug/dL (ref 250.0–450.0)
Transferrin: 259 mg/dL (ref 212.0–360.0)

## 2024-01-14 LAB — MICROALBUMIN / CREATININE URINE RATIO
Creatinine,U: 63.3 mg/dL
Microalb Creat Ratio: 48.4 mg/g — ABNORMAL HIGH (ref 0.0–30.0)
Microalb, Ur: 3.1 mg/dL — ABNORMAL HIGH (ref 0.0–1.9)

## 2024-01-14 LAB — BASIC METABOLIC PANEL WITH GFR
BUN: 43 mg/dL — ABNORMAL HIGH (ref 6–23)
CO2: 25 meq/L (ref 19–32)
Calcium: 9.6 mg/dL (ref 8.4–10.5)
Chloride: 101 meq/L (ref 96–112)
Creatinine, Ser: 1.61 mg/dL — ABNORMAL HIGH (ref 0.40–1.50)
GFR: 42.41 mL/min — ABNORMAL LOW (ref >60.00)
Glucose, Bld: 162 mg/dL — ABNORMAL HIGH (ref 70–99)
Potassium: 4.2 meq/L (ref 3.5–5.1)
Sodium: 136 meq/L (ref 135–145)

## 2024-01-14 LAB — URINALYSIS, ROUTINE W REFLEX MICROSCOPIC
Bilirubin Urine: NEGATIVE
Hgb urine dipstick: NEGATIVE
Ketones, ur: NEGATIVE
Leukocytes,Ua: NEGATIVE
Nitrite: NEGATIVE
RBC / HPF: NONE SEEN (ref 0–?)
Specific Gravity, Urine: 1.01 (ref 1.000–1.030)
Total Protein, Urine: NEGATIVE
Urine Glucose: >=1000 — AB
Urobilinogen, UA: 0.2 (ref 0.0–1.0)
pH: 6 (ref 5.0–8.0)

## 2024-01-14 LAB — VITAMIN B12: Vitamin B-12: 733 pg/mL (ref 211–911)

## 2024-01-14 LAB — CBC WITH DIFFERENTIAL/PLATELET
Basophils Absolute: 0 K/uL (ref 0.0–0.1)
Basophils Relative: 0.1 % (ref 0.0–3.0)
Eosinophils Absolute: 0.1 K/uL (ref 0.0–0.7)
Eosinophils Relative: 0.8 % (ref 0.0–5.0)
HCT: 40.7 % (ref 39.0–52.0)
Hemoglobin: 14.1 g/dL (ref 13.0–17.0)
Lymphocytes Relative: 16.2 % (ref 12.0–46.0)
Lymphs Abs: 1.6 K/uL (ref 0.7–4.0)
MCHC: 34.7 g/dL (ref 30.0–36.0)
MCV: 86.4 fl (ref 78.0–100.0)
Monocytes Absolute: 0.5 K/uL (ref 0.1–1.0)
Monocytes Relative: 5.2 % (ref 3.0–12.0)
Neutro Abs: 7.6 K/uL (ref 1.4–7.7)
Neutrophils Relative %: 77.7 % — ABNORMAL HIGH (ref 43.0–77.0)
Platelets: 176 K/uL (ref 150.0–400.0)
RBC: 4.7 Mil/uL (ref 4.22–5.81)
RDW: 14.1 % (ref 11.5–15.5)
WBC: 9.7 K/uL (ref 4.0–10.5)

## 2024-01-14 LAB — FOLATE: Folate: 11.4 ng/mL (ref >5.9)

## 2024-01-14 LAB — CORTISOL: Cortisol, Plasma: 12.4 ug/dL

## 2024-01-14 NOTE — Patient Instructions (Signed)

## 2024-01-14 NOTE — Progress Notes (Unsigned)
 Subjective:  Patient ID: William [name], male    DOB: 11/16/1950  Age: 73 y.o. MRN: [id number]  CC: Cough and Anemia   HPI William [name] presents for f/up  ----  Discussed the use of AI scribe software for clinical note transcription with the patient, who gave verbal consent to proceed.  History of Present Illness   William [name] is a 73 year old male who presents for a follow-up for pneumonia.  He was recently hospitalized for pneumonia, staying for three days and receiving antibiotic treatment. He continues to experience a persistent cough. He drinks hot water to alleviate the cough but finds it difficult to expectorate phlegm at times. He has not been taking any medication for the cough recently, except for diabetic Robitussin when sick, and has used cough drops in the past.  He recalls being discharged from the hospital in early June, possibly on a Monday, and attributes his illness to sleeping in front of an air conditioner.   He has a history of low blood pressure, which led to the discontinuation of carvedilol  after consulting with his cardiologist. He is unsure about his blood pressure readings but recalls being told it was low.  He experiences symptoms suggestive of allergies, such as a runny nose and sneezing every morning, which he attributes to moving to [place] . No wheezing or shortness of breath.       Outpatient Medications Prior to Visit  Medication Sig Dispense Refill   ACCU-CHEK GUIDE TEST test strip USE TO CHECK BLOOD SUGAR IN THE AM, AT NOON AND AT BEDTIME 100 strip 5   acetaminophen  (TYLENOL ) 500 MG tablet Take 500 mg by mouth every 6 (six) hours as needed for moderate pain.     aspirin  EC 81 MG tablet Take 1 tablet (81 mg total) by mouth daily. Swallow whole.     Blood Glucose Monitoring Suppl DEVI 1 each by Does not apply route in the morning, at noon, and at bedtime. May substitute to any manufacturer covered by patient's insurance. 1 each 1   Continuous  Glucose Receiver (DEXCOM G7 RECEIVER) DEVI 1 Act by Does not apply route daily. 9 each 1   Continuous Glucose Sensor (DEXCOM G7 SENSOR) MISC INJECT 1 SENSOR EVERY 10 DAYS TO MONITOR BLOOD SUGAR 9 each 1   dapagliflozin  propanediol (FARXIGA ) 10 MG TABS tablet TAKE 1 TABLET(10 MG) BY MOUTH DAILY 90 tablet 1   gabapentin  (NEURONTIN ) 300 MG capsule TAKE 1 CAPSULE(300 MG) BY MOUTH TWICE DAILY 180 capsule 0   icosapent  Ethyl (VASCEPA ) 1 g capsule Take 2 capsules (2 g total) by mouth 2 (two) times daily. 360 capsule 1   Insulin  Pen Needle 31G X 8 MM MISC 1 each by Does not apply route 4 (four) times daily. 300 each 2   LANTUS  SOLOSTAR 100 UNIT/ML Solostar Pen ADMINISTER 40 UNITS UNDER THE SKIN AT BEDTIME 36 mL 1   OZEMPIC, 0.25 OR 0.5 MG/DOSE, 2 MG/3ML SOPN Inject 0.25 mg into the skin once a week.     pantoprazole  (PROTONIX ) 40 MG tablet TAKE 1 TABLET(40 MG) BY MOUTH TWICE DAILY BEFORE A MEAL 180 tablet 1   pramipexole  (MIRAPEX ) 0.125 MG tablet TAKE 1 TABLET(0.125 MG) BY MOUTH AT BEDTIME 90 tablet 1   rosuvastatin  (CRESTOR ) 40 MG tablet TAKE 1 TABLET BY MOUTH EVERY DAY 90 tablet 1   torsemide  (DEMADEX ) 20 MG tablet Take 20 mg by mouth 2 (two) times a week.     warfarin (COUMADIN ) 5 MG tablet  TAKE 1 TO 1 AND 1/2 TABLETS BY MOUTH DAILY AS DIRECTED BY COUMADIN  CLINIC 120 tablet 1   silver  sulfADIAZINE  (SILVADENE ) 1 % cream Apply 1 Application topically daily. (Patient taking differently: Apply 1 Application topically daily as needed (Wound).) 400 g 2   carvedilol  (COREG ) 3.125 MG tablet TAKE 1 TABLET(3.125 MG) BY MOUTH TWICE DAILY WITH A MEAL (Patient not taking: Reported on 12/23/2023) 180 tablet 1   torsemide  (DEMADEX ) 20 MG tablet Take 1 tablet (20 mg total) by mouth every other day. TAKE 1 TABLET(20 MG) BY MOUTH 3 TIMES A WEEK Monday, Wednesday and Friday (Patient not taking: Reported on 12/23/2023) 38 tablet 0   No facility-administered medications prior to visit.    ROS Review of Systems   Constitutional: Negative.  Negative for appetite change, chills and diaphoresis.  HENT: Negative.    Respiratory:  Positive for cough. Negative for chest tightness, shortness of breath and wheezing.   Cardiovascular:  Negative for chest pain, palpitations and leg swelling.  Gastrointestinal: Negative.  Negative for abdominal pain, constipation, diarrhea, nausea and vomiting.  Genitourinary: Negative.  Negative for difficulty urinating.  Musculoskeletal:  Positive for arthralgias and gait problem. Negative for myalgias.  Skin: Negative.   Neurological:  Negative for dizziness, weakness and light-headedness.  Hematological:  Negative for adenopathy. Does not bruise/bleed easily.  Psychiatric/Behavioral:  Positive for confusion and decreased concentration.     Objective:  BP 104/68 (BP Location: Left Arm, Patient Position: Sitting, Cuff Size: Normal)   Pulse 82   Temp 97.6 F (36.4 C) (Oral)   Resp 16   Ht 5' 5 (1.651 m)   Wt 175 lb (79.4 kg)   SpO2 95%   BMI 29.12 kg/m   BP Readings from Last 3 Encounters:  01/14/24 104/68  12/10/23 122/82  11/07/23 (!) 140/70    Wt Readings from Last 3 Encounters:  01/14/24 175 lb (79.4 kg)  12/10/23 181 lb (82.1 kg)  11/07/23 177 lb 12.8 oz (80.6 kg)    Physical Exam Vitals reviewed.  Constitutional:      General: He is not in acute distress.    Appearance: He is ill-appearing. He is not toxic-appearing or diaphoretic.  HENT:     Nose: Nose normal.     Mouth/Throat:     Mouth: Mucous membranes are moist.  Eyes:     General: No scleral icterus.    Conjunctiva/sclera: Conjunctivae normal.  Cardiovascular:     Rate and Rhythm: Normal rate and regular rhythm.     Heart sounds: No murmur heard.    No friction rub. No gallop.  Pulmonary:     Effort: Pulmonary effort is normal.     Breath sounds: No stridor. No wheezing, rhonchi or rales.  Abdominal:     General: Abdomen is flat.     Palpations: There is no mass.     Tenderness:  There is no abdominal tenderness. There is no guarding.     Hernia: No hernia is present.  Musculoskeletal:        General: Normal range of motion.     Cervical back: Neck supple.     Right lower leg: No edema.     Left lower leg: No edema.  Lymphadenopathy:     Cervical: No cervical adenopathy.  Skin:    General: Skin is warm and dry.  Neurological:     General: No focal deficit present.     Mental Status: He is alert.  Psychiatric:  Mood and Affect: Mood normal.        Behavior: Behavior normal.     Lab Results  Component Value Date   WBC 9.7 01/14/2024   HGB 14.1 01/14/2024   HCT 40.7 01/14/2024   PLT 176.0 01/14/2024   GLUCOSE 162 (H) 01/14/2024   CHOL 106 08/19/2023   TRIG 296.0 (H) 08/19/2023   HDL 26.10 (L) 08/19/2023   LDLDIRECT 54.0 11/05/2022   LDLCALC 20 08/19/2023   ALT 28 10/01/2023   AST 25 10/01/2023   NA 136 01/14/2024   K 4.2 01/14/2024   CL 101 01/14/2024   CREATININE 1.61 (H) 01/14/2024   BUN 43 (H) 01/14/2024   CO2 25 01/14/2024   TSH 0.655 10/01/2023   PSA 3.50 05/07/2022   INR 2.4 12/23/2023   HGBA1C 6.5 (A) 11/07/2023   MICROALBUR 3.1 (H) 01/14/2024    EEG adult Result Date: 10/02/2023 Shelton Arlin KIDD, MD     10/02/2023  8:48 AM Patient Name: Lenny Fiumara MRN: [id number] Epilepsy Attending: Arlin KIDD Shelton Referring Physician/Provider: Tegeler, Lonni PARAS, MD Date: 10/01/2023 Duration: 26.04 mins Patient history: 72yo m with syncope. EEG to evaluate for seizure Level of alertness: Awake AEDs during EEG study: None Technical aspects: This EEG study was done with scalp electrodes positioned according to the 10-20 International system of electrode placement. Electrical activity was reviewed with band pass filter of 1-70Hz , sensitivity of 7 uV/mm, display speed of 37mm/sec with a 60Hz  notched filter applied as appropriate. EEG data were recorded continuously and digitally stored.  Video monitoring was available and reviewed as appropriate.  Description: The posterior dominant rhythm consists of 8 Hz activity of moderate voltage (25-35 uV) seen predominantly in posterior head regions, symmetric and reactive to eye opening and eye closing. Physiologic photic driving was not seen during photic stimulation.  Hyperventilation was not performed.   Study was technically difficult due to significant myogenic artifact. IMPRESSION: This technically difficult study is within normal limits. No seizures or epileptiform discharges were seen throughout the recording. A normal interictal EEG does not exclude the diagnosis of epilepsy. Arlin KIDD Shelton   MR BRAIN WO CONTRAST Result Date: 10/01/2023 CLINICAL DATA:  Diplopia and dizziness EXAM: MRI HEAD WITHOUT CONTRAST TECHNIQUE: Multiplanar, multiecho pulse sequences of the brain and surrounding structures were obtained without intravenous contrast. COMPARISON:  None Available. FINDINGS: Brain: No acute infarct, mass effect or extra-axial collection. No acute or chronic hemorrhage. Normal white matter signal, parenchymal volume and CSF spaces. The midline structures are normal. Vascular: Normal flow voids. Skull and upper cervical spine: Normal calvarium and skull base. Visualized upper cervical spine and soft tissues are normal. Sinuses/Orbits:No paranasal sinus fluid levels or advanced mucosal thickening. No mastoid or middle ear effusion. Normal orbits. IMPRESSION: Normal brain MRI. Electronically Signed   By: Franky Stanford M.D.   On: 10/01/2023 23:52   DG Chest Port 1 View Result Date: 10/01/2023 CLINICAL DATA:  Sepsis, pallor, shaking EXAM: PORTABLE CHEST 1 VIEW COMPARISON:  06/04/2023 FINDINGS: Single frontal view of the chest demonstrates postsurgical changes from CABG. The cardiac silhouette is unremarkable. No acute airspace disease, effusion, or pneumothorax. No acute bony abnormalities. IMPRESSION: 1. No acute intrathoracic process. Electronically Signed   By: Ozell Daring M.D.   On: 10/01/2023 19:37    CT HEAD CODE STROKE WO CONTRAST Result Date: 10/01/2023 CLINICAL DATA:  Code stroke. Provided history: Neuro deficit, acute, stroke suspected. EXAM: CT HEAD WITHOUT CONTRAST TECHNIQUE: Contiguous axial images were obtained from the base of the  skull through the vertex without intravenous contrast. RADIATION DOSE REDUCTION: This exam was performed according to the departmental dose-optimization program which includes automated exposure control, adjustment of the mA and/or kV according to patient size and/or use of iterative reconstruction technique. COMPARISON:  None. FINDINGS: Brain: Mild generalized cerebral atrophy. Small chronic cortical infarcts within the left parietal and occipital lobes. There is no acute intracranial hemorrhage. No extra-axial fluid collection. No evidence of an intracranial mass. No midline shift. Vascular: No hyperdense vessel. Atherosclerotic calcifications. Skull: No calvarial fracture or aggressive osseous lesion. Sinuses/Orbits: No mass or acute finding within the imaged orbits. Trace mucosal thickening within the bilateral ethmoid sinuses. ASPECTS [facility] Stroke Program Early CT Score) - Ganglionic level infarction (caudate, lentiform nuclei, internal capsule, insula, M1-M3 cortex): 7 - Supraganglionic infarction (M4-M6 cortex): 3 Total score (0-10 with 10 being normal): 10 No evidence of an acute intracranial abnormality. These results were communicated to Dr. Jerrie At 6:46 pmon 4/1/2025by text page via the [facility] messaging system. IMPRESSION: 1.  No evidence of an acute intracranial abnormality. 2. Mild cerebral atrophy. Electronically Signed   By: Rockey Childs D.O.   On: 10/01/2023 18:46   Review of the MIP images confirms the above findings.  IMPRESSION: *No evidence of pulmonary embolism. *Subtle ill-defined nodular infiltrates of the left lower lobe with a ill-defined non consolidative non nodular infiltrates of the right lower lobe could correlate with bilateral  basilar pneumonic infiltrates.   Electronically Signed   By: Franky Chard M.D.   On: 12/06/2023 08:39 Exam End: 12/06/23 08:32   Specimen Collected: 12/06/23 08:36 Last Resulted: 12/06/23 08:39  Received From: [facility]  Result Received: 12/22/23 11:44   No results found.    Assessment & Plan:  Deficiency anemia- H/H are normal now. -     IBC + Ferritin; Future -     Lactate dehydrogenase; Future -     Reticulocytes; Future -     Vitamin B1; Future -     CBC with Differential/Platelet; Future -     Vitamin B12; Future -     Folate; Future -     Methylmalonic acid, serum; Future  Idiopathic hypotension- Labs are reassuring. -     CBC with Differential/Platelet; Future -     Cortisol; Future -     Basic metabolic panel with GFR; Future -     Urinalysis, Routine w reflex microscopic; Future  Essential hypertension- BP has been over-controlled. -     Basic metabolic panel with GFR; Future -     Urinalysis, Routine w reflex microscopic; Future  Subacute cough -     DG Chest 2 View; Future  Insulin -requiring or dependent type II diabetes mellitus ([name]) -     Microalbumin / creatinine urine ratio; Future -     Urinalysis, Routine w reflex microscopic; Future  CKD stage 3b, GFR 30-44 ml/min ([name])- Will avoid nephrotoxic agents  -     Microalbumin / creatinine urine ratio; Future -     Urinalysis, Routine w reflex microscopic; Future     Follow-up: Return in about 3 months (around 04/15/2024).  Debby Molt, MD

## 2024-01-15 ENCOUNTER — Ambulatory Visit: Payer: Self-pay | Admitting: Internal Medicine

## 2024-01-20 ENCOUNTER — Other Ambulatory Visit

## 2024-01-20 ENCOUNTER — Other Ambulatory Visit: Payer: Self-pay | Admitting: Internal Medicine

## 2024-01-20 ENCOUNTER — Ambulatory Visit: Attending: Cardiovascular Disease

## 2024-01-20 DIAGNOSIS — D539 Nutritional anemia, unspecified: Secondary | ICD-10-CM

## 2024-01-20 DIAGNOSIS — Z5181 Encounter for therapeutic drug level monitoring: Secondary | ICD-10-CM | POA: Diagnosis not present

## 2024-01-20 DIAGNOSIS — I480 Paroxysmal atrial fibrillation: Secondary | ICD-10-CM | POA: Diagnosis not present

## 2024-01-20 LAB — POCT INR: INR: 2.7 (ref 2.0–3.0)

## 2024-01-20 LAB — EXTRA SPECIMEN

## 2024-01-20 LAB — METHYLMALONIC ACID, SERUM: Methylmalonic Acid, Quant: 224 nmol/L (ref 69–390)

## 2024-01-20 LAB — ZINC

## 2024-01-20 LAB — LACTATE DEHYDROGENASE: LDH: 209 U/L (ref 120–250)

## 2024-01-20 LAB — RETICULOCYTES
ABS Retic: 94800 {cells}/uL — ABNORMAL HIGH (ref 25000–90000)
Retic Ct Pct: 2 %

## 2024-01-20 LAB — VITAMIN B1: Vitamin B1 (Thiamine): 11 nmol/L (ref 8–30)

## 2024-01-20 NOTE — Progress Notes (Signed)
 INR 2.7  Please see anticoagulation encounter

## 2024-01-20 NOTE — Patient Instructions (Signed)
 continue taking 1.5 tablets daily except for 1 tablet every Mondays and Fridays.  Eat greens tonight. Recheck INR in 5 weeks.  Stay consistent with greens [facility] [phone]

## 2024-01-21 NOTE — Progress Notes (Addendum)
 [facility] - Clinic Note  02/03/2024    CHIEF COMPLAINT Patient presents for Retina Follow Up  HISTORY OF PRESENT ILLNESS: William [name] is a 73 y.o. male who presents to the clinic today for:   HPI     Retina Follow Up   Patient presents with  Diabetic Retinopathy.  In both eyes.  Severity is moderate.  Duration of 6 weeks.  Since onset it is stable.  I, the attending physician,  performed the HPI with the patient and updated documentation appropriately.        Comments   Pt here for 6 wk ret f/u NPDR OU. Pt states VA is stable, no changes. Pt was hospitalized w/ pneumonia again since last visit.       Last edited by Valdemar Rogue, MD on 02/03/2024  1:03 PM.     Pt states vision is doing well, pts daughter states he is not eating correctly, which is affecting his blood sugar  Referring physician: Joshua Debby CROME, MD [address] [place],  [place] [address]  HISTORICAL INFORMATION:  Selected notes from the MEDICAL RECORD NUMBER Referred by Dr. Lelon for eval of DME OU LEE:  Ocular Hx- PMH-    CURRENT MEDICATIONS: No current outpatient medications on file. (Ophthalmic Drugs)   No current facility-administered medications for this visit. (Ophthalmic Drugs)   Current Outpatient Medications (Other)  Medication Sig   acetaminophen  (TYLENOL ) 500 MG tablet Take 500 mg by mouth every 6 (six) hours as needed for moderate pain.   aspirin  EC 81 MG tablet Take 1 tablet (81 mg total) by mouth daily. Swallow whole.   Blood Glucose Monitoring Suppl DEVI Use to test blood sugar morning, at noon, and at bedtime.   Continuous Glucose Receiver (DEXCOM G7 RECEIVER) DEVI Use to monitor blood sugar.   Continuous Glucose Sensor (DEXCOM G7 SENSOR) MISC INJECT 1 SENSOR EVERY 10 DAYS TO MONITOR BLOOD SUGAR   dapagliflozin  propanediol (FARXIGA ) 10 MG TABS tablet Take 1 tablet (10 mg total) by mouth daily.   gabapentin  (NEURONTIN ) 300 MG capsule TAKE 1 CAPSULE(300 MG) BY  MOUTH TWICE DAILY   glucose blood (ACCU-CHEK GUIDE TEST) test strip USE TO CHECK BLOOD SUGAR IN THE AM, AT NOON AND AT BEDTIME   icosapent  Ethyl (VASCEPA ) 1 g capsule Take 2 capsules (2 g total) by mouth 2 (two) times daily.   insulin  glargine (LANTUS  SOLOSTAR) 100 UNIT/ML Solostar Pen ADMINISTER 40 UNITS UNDER THE SKIN AT BEDTIME   OZEMPIC , 0.25 OR 0.5 MG/DOSE, 2 MG/3ML SOPN Inject 0.25 mg into the skin once a week.   pantoprazole  (PROTONIX ) 40 MG tablet TAKE 1 TABLET(40 MG) BY MOUTH TWICE DAILY BEFORE A MEAL   pramipexole  (MIRAPEX ) 0.125 MG tablet TAKE 1 TABLET(0.125 MG) BY MOUTH AT BEDTIME   rosuvastatin  (CRESTOR ) 40 MG tablet TAKE 1 TABLET BY MOUTH EVERY DAY   torsemide  (DEMADEX ) 20 MG tablet Take 20 mg by mouth 2 (two) times a week.   warfarin (COUMADIN ) 5 MG tablet TAKE 1 TO 1 AND 1/2 TABLETS BY MOUTH DAILY AS DIRECTED BY COUMADIN  CLINIC   carvedilol  (COREG ) 3.125 MG tablet Take 1 tablet (3.125 mg total) by mouth 2 (two) times daily with a meal.   clopidogrel  (PLAVIX ) 75 MG tablet Take 1 tablet (75 mg total) by mouth daily.   Continuous Glucose Receiver (FREESTYLE LIBRE 2 READER) DEVI Use to check blood sugar daily.   icosapent  Ethyl (VASCEPA ) 1 g capsule Take 2 capsules (2 g total)  by mouth 2 (two) times daily.   Insulin  Glargine Solostar (LANTUS ) 100 UNIT/ML Solostar Pen Inject 60 Units into the skin daily.   Insulin  Glargine Solostar (LANTUS ) 100 UNIT/ML Solostar Pen Inject 55 Units into the skin every evening.   Insulin  Pen Needle 31G X 8 MM MISC Use once daily as directed.   Insulin  Pen Needle 31G X 8 MM MISC Use to inject insulin  four times daily.   Lancets Misc. (ACCU-CHEK FASTCLIX LANCET) KIT Use in the morning, noon and at bedtime.   rosuvastatin  (CRESTOR ) 40 MG tablet Take 1 tablet (40 mg total) by mouth daily.   Semaglutide ,0.25 or 0.5MG /DOS, (OZEMPIC , 0.25 OR 0.5 MG/DOSE,) 2 MG/3ML SOPN 0.25 mg once a week for 28 days, THEN 0.5 mg once a week for 28 days.   torsemide  (DEMADEX )  20 MG tablet Take 1 tablet (20 mg total) by mouth 3 (three) times a week.   warfarin (COUMADIN ) 5 MG tablet Take 1-1.5 tablets (5-7.5 mg total) by mouth daily as directed by coumadin  clinic   No current facility-administered medications for this visit. (Other)   REVIEW OF SYSTEMS: ROS   Positive for: Gastrointestinal, Genitourinary, Endocrine, Eyes Negative for: Constitutional, Neurological, Skin, Musculoskeletal, HENT, Cardiovascular, Respiratory, Psychiatric, Allergic/Imm, Heme/Lymph Last edited by Antonetta Almetta BRAVO, COT on 02/03/2024  7:43 AM.      ALLERGIES Allergies  Allergen Reactions   Morphine And Codeine  Shortness Of Breath and Other (See Comments)    UNSPECIFIED REACTION Pt said it was too much    Latex Rash   PAST MEDICAL HISTORY Past Medical History:  Diagnosis Date   AKI (acute kidney injury) ([name])    With STEMI in 2017   Chronic systolic CHF (congestive heart failure) ([name])    Depression    Diabetes mellitus without complication ([name])    Diabetic retinopathy ([name])    GERD (gastroesophageal reflux disease)    Hx of adenomatous colonic polyps 04/07/2018   Hyperlipidemia    Hypertension    Hypertensive retinopathy    Paroxysmal atrial fibrillation ([name])    Peripheral vascular disease ([name])    Pneumonia    Seizures ([name])    hx of as a child   STEMI (ST elevation myocardial infarction) ([name]) 2017   Past Surgical History:  Procedure Laterality Date   ABDOMINAL AORTOGRAM W/LOWER EXTREMITY N/A 09/19/2016   Procedure: Abdominal Aortogram w/Lower Extremity;  Surgeon: Deatrice DELENA Cage, MD;  Location: [facility];  Service: Cardiovascular;  Laterality: N/A;   ABDOMINAL AORTOGRAM W/LOWER EXTREMITY N/A 07/17/2023   Procedure: ABDOMINAL AORTOGRAM W/LOWER EXTREMITY;  Surgeon: Cage Deatrice DELENA, MD;  Location: [facility];  Service: Cardiovascular;  Laterality: N/A;   AMPUTATION Right 03/23/2016   Procedure: 1st and 2nd Ray Amputation Right Foot;   Surgeon: Jerona LULLA Sage, MD;  Location: [facility] OR;  Service: Orthopedics;  Laterality: Right;   AMPUTATION Right 06/21/2016   Procedure: RIGHT TRANSMETATARSAL AMPUTATION;  Surgeon: Jerona LULLA Sage, MD;  Location: [facility] OR;  Service: Orthopedics;  Laterality: Right;   CARDIAC CATHETERIZATION N/A 03/13/2016   Procedure: Right/Left Heart Cath and Coronary Angiography;  Surgeon: Ozell Fell, MD;  Location: [facility] INVASIVE CV LAB;  Service: Cardiovascular;  Laterality: N/A;   CARDIAC CATHETERIZATION N/A 03/13/2016   Procedure: IABP Insertion;  Surgeon: Ozell Fell, MD;  Location: [facility] INVASIVE CV LAB;  Service: Cardiovascular;  Laterality: N/A;   CATARACT EXTRACTION     CATARACT EXTRACTION, BILATERAL     CERVICAL FUSION  1982, 1992   has  had 3 neck surgeries from breaking his neck   CIRCUMCISION N/A 11/07/2021   Procedure: CIRCUMCISION ADULT;  Surgeon: Lovie Arlyss CROME, MD;  Location: [facility] ORS;  Service: Urology;  Laterality: N/A;   CORONARY ARTERY BYPASS GRAFT N/A 03/13/2016   Procedure: CORONARY ARTERY BYPASS GRAFTING (CABG) x 1 (SVG to OM) with EVH from LEFT GREATER SAPHENOUS VEIN;  Surgeon: Maude Fleeta Ochoa, MD;  Location: [facility] OR;  Service: Open Heart Surgery;  Laterality: N/A;   EYE SURGERY     LOWER EXTREMITY ANGIOGRAM  05/02/2016   Procedure: Lower Extremity Angiogram;  Surgeon: Deatrice DELENA Cage, MD;  Location: [facility];  Service: Cardiovascular;;  Limited left femoral runoff right femoral runoff   PERIPHERAL VASCULAR BALLOON ANGIOPLASTY  07/17/2023   Procedure: PERIPHERAL VASCULAR BALLOON ANGIOPLASTY;  Surgeon: Cage Deatrice DELENA, MD;  Location: [facility];  Service: Cardiovascular;;   PERIPHERAL VASCULAR CATHETERIZATION N/A 05/02/2016   Procedure: Abdominal Aortogram;  Surgeon: Deatrice DELENA Cage, MD;  Location: [facility];  Service: Cardiovascular;  Laterality: N/A;   PERIPHERAL VASCULAR CATHETERIZATION Right 05/02/2016   Procedure: Peripheral Vascular Balloon Angioplasty;   Surgeon: Deatrice DELENA Cage, MD;  Location: [facility];  Service: Cardiovascular;  Laterality: Right;  SFA   TEE WITHOUT CARDIOVERSION N/A 03/13/2016   Procedure: TRANSESOPHAGEAL ECHOCARDIOGRAM (TEE);  Surgeon: Maude Fleeta Ochoa, MD;  Location: [facility] OR;  Service: Open Heart Surgery;  Laterality: N/A;   VSD REPAIR N/A 03/13/2016   Procedure: VENTRICULAR SEPTAL DEFECT (VSD) REPAIR;  Surgeon: Maude Fleeta Ochoa, MD;  Location: [facility] OR;  Service: Open Heart Surgery;  Laterality: N/A;   FAMILY HISTORY Family History  Problem Relation Age of Onset   Diabetes Maternal Grandmother    Diabetes Mother    Aneurysm Mother    Peripheral Artery Disease Mother    Coronary artery disease Mother    Peptic Ulcer Father    Retinoblastoma Daughter    Colon cancer Neg Hx    Rectal cancer Neg Hx    SOCIAL HISTORY Social History   Tobacco Use   Smoking status: Former    Current packs/day: 0.00    Types: Cigarettes    Quit date: 11/20/2015    Years since quitting: 8.2   Smokeless tobacco: Never   Tobacco comments:    quit 2018  Vaping Use   Vaping status: Never Used  Substance Use Topics   Alcohol use: No   Drug use: No       OPHTHALMIC EXAM: Base Eye Exam     Visual Acuity (Snellen - Linear)       Right Left   Dist [place] 20/40 20/20   Dist ph [place] 20/25          Tonometry (Tonopen, 7:50 AM)       Right Left   Pressure 12 11         Pupils       Pupils Dark Light Shape React APD   Right PERRL 2 1 Round Brisk None   Left PERRL 2 1 Round Brisk None         Visual Fields (Counting fingers)       Left Right    Full Full         Extraocular Movement       Right Left    Full, Ortho Full, Ortho         Neuro/Psych     Oriented x3: Yes   Mood/Affect: Normal  Dilation     Both eyes: 1.0% Mydriacyl, 2.5% Phenylephrine  @ 7:51 AM           Slit Lamp and Fundus Exam     Slit Lamp Exam       Right Left   Lids/Lashes Dermatochalasis - upper lid, mild  MGD Dermatochalasis - upper lid, mild MGD   Conjunctiva/Sclera White and quiet White and quiet   Cornea trace PEE, well healed cataract wound, mild tear film debris 1+ fine PEE, mild tear film debris, well healed cataract wound   Anterior Chamber Deep and quiet Deep and quiet   Iris Round and dilated, No NVI Round and dilated, No NVI   Lens PC IOL in good position PC IOL in good position   Anterior Vitreous Vitreous syneresis, vitreous condensations Vitreous syneresis, Posterior vitreous detachment, vitreous condensations         Fundus Exam       Right Left   Disc trace Pallor, Sharp rim mild Pallor, Sharp rim, mild tilt   C/D Ratio 0.3 0.3   Macula Flat, good foveal reflex, scatted Microaneurysms, cystic changes superior macula IT macula -- slightly increased, good focal laser changes superiorly, good focal laser targets inferior macula Flat, good foveal reflex, scattered MA -- stably improved, mild interval increase in cystic changes temporal and IT mac, good focal laser changes   Vessels attenuated, mild tortuosity, no NV attenuated, Tortuous   Periphery Attached, +MA greatest posteriorly Attached, scattered MA greatest posteriorly           IMAGING AND PROCEDURES  Imaging and Procedures for 02/03/2024  OCT, Retina - OU - Both Eyes       Right Eye Quality was good. Central Foveal Thickness: 296. Progression has worsened. Findings include no SRF, abnormal foveal contour, intraretinal hyper-reflective material, intraretinal fluid, vitreomacular adhesion (Mild interval increase in IRF/cystic changes inferior fovea and mac ).   Left Eye Quality was good. Central Foveal Thickness: 263. Progression has worsened. Findings include normal foveal contour, no SRF, intraretinal hyper-reflective material, intraretinal fluid (Mild interval increase in cystic changes IN and temporal macula).   Notes *Images captured and stored on drive  Diagnosis / Impression:  +DME OU OD: mild interval  increase in IRF/cystic changes inferior fovea and mac  OS: Mild interval increase in cystic changes IN and temporal macula  Clinical management:  See below  Abbreviations: NFP - Normal foveal profile. CME - cystoid macular edema. PED - pigment epithelial detachment. IRF - intraretinal fluid. SRF - subretinal fluid. EZ - ellipsoid zone. ERM - epiretinal membrane. ORA - outer retinal atrophy. ORT - outer retinal tubulation. SRHM - subretinal hyper-reflective material. IRHM - intraretinal hyper-reflective material      Intravitreal Injection, Pharmacologic Agent - OD - Right Eye       Time Out 02/03/2024. 8:27 AM. Confirmed correct patient, procedure, site, and patient consented.   Anesthesia Topical anesthesia was used. Anesthetic medications included Lidocaine  2%, Proparacaine 0.5%.   Procedure Preparation included 5% betadine  to ocular surface, eyelid speculum. A supplied (32g) needle was used.   Injection: 1.25 mg Bevacizumab  1.25mg /0.40ml   Route: Intravitreal, Site: Right Eye   NDC: C2662926, Lot: [id number], Expiration date: 02/24/2024   Post-op Post injection exam found visual acuity of at least counting fingers. The patient tolerated the procedure well. There were no complications. The patient received written and verbal post procedure care education. Post injection medications were not given.      Intravitreal Injection, Pharmacologic Agent - OS -  Left Eye       Time Out 02/03/2024. 8:28 AM. Confirmed correct patient, procedure, site, and patient consented.   Anesthesia Topical anesthesia was used. Anesthetic medications included Lidocaine  2%, Proparacaine 0.5%.   Procedure Preparation included 5% betadine  to ocular surface, eyelid speculum. A supplied (32g) needle was used.   Injection: 1.25 mg Bevacizumab  1.25mg /0.108ml   Route: Intravitreal, Site: Left Eye   NDC: C2662926, Lot: [id number]$ , Expiration date: 02/23/2024   Post-op Post injection exam found visual  acuity of at least counting fingers. The patient tolerated the procedure well. There were no complications. The patient received written and verbal post procedure care education. Post injection medications were not given.            ASSESSMENT/PLAN:    ICD-10-CM   1. Moderate nonproliferative diabetic retinopathy of both eyes with macular edema associated with type 2 diabetes mellitus ([name])  E11.3313 OCT, Retina - OU - Both Eyes    Intravitreal Injection, Pharmacologic Agent - OD - Right Eye    Intravitreal Injection, Pharmacologic Agent - OS - Left Eye    Bevacizumab  (AVASTIN ) SOLN 1.25 mg    Bevacizumab  (AVASTIN ) SOLN 1.25 mg    2. Current use of insulin  ([name])  Z79.4     3. Long-term (current) use of injectable non-insulin  antidiabetic drugs  Z79.85     4. Essential hypertension  I10     5. Hypertensive retinopathy of both eyes  H35.033     6. Pseudophakia, both eyes  Z96.1       1-3. Moderate non-proliferative diabetic retinopathy, both eyes  - last A1c was 6.5 on 02.17.25  - s/p IVA OD #1 (11.14.22), #2 (12.12.22), #3 (01.09.23), #4 (06.23.25)  - s/p IVA OS #1 (10.17.22), #2 (11.14.22), #3 (12.12.22), #4 (01.09.23), #5 (03.15.23), #6 (04.12.23)  ============================ - s/p IVE OD #1 (03.15.23), #2 (04.12.23), #3 (05.10.23), #4 (06.07.23), #5 (07.05.23), #6 (08.02.23), #7 (08.31.23), #8 (09.28.23), #9 (10.30.23), #10 (11.28.23), #11 (12.28.23), #12 (01.29.24), #13 (02.20.24), #14 (03.25.24), #15 (04.22.24), #16 (05.20.24), #17 (06.17.24), #18 (07.22.24), #19 (08.26.24), #20 (09.30.24), #21 (11.04.24), #22 (12.16.24) - s/p IVE OS #1 (05.10.23), #2 (06.07.23), #3 (07.05.23), #4 (08.02.23), #5 (08.31.23), #6 (09.28.23), #7 (10.30.23), #8 (11.28.23), #9 (12.28.23), #10 (01.29.24), #11 (02.20..24), #12 (03.25.24), #13 (04.22.24), #14 (05.20.24), #15 (06.17.24), #16 (07.22.24), #17 (09.30.24), #18 (11.04.24), #19 (12.16.24) ============================== - s/p focal laser OS  (06.25.24) - s/p focal laser OD (01.13.25) - exam shows scattered MA, DBH OU - FA (10.17.22) shows late leaking MA OU, no NV OU - OCT shows OD: mild interval increase in IRF/cystic changes inferior fovea and mac; OS: Mild interval increase in cystic changes IN and temporal macula - BCVA OD 20/25 - stable; OS stable at 20/20 - recommend IVA OD #5 and OS #7 today, 08.04.25 w/  wks ([employer] funding unavailable) with follow up back to 4 weeks - pt wishes to proceed with injections - RBA of procedure discussed, questions answered - IVA informed consent obtained and signed, 06.23.25 - see procedure note - f/u 4 weeks, sooner prn -- DFE, OCT, FA transit OD, possible injection(s)  4,5. Hypertensive retinopathy OU - discussed importance of tight BP control  - monitor  6. Pseudophakia OU  - s/p CE/IOL OU  - IOL in good position, doing well  - monitor  Ophthalmic Meds Ordered this visit:  Meds ordered this encounter  Medications   Bevacizumab  (AVASTIN ) SOLN 1.25 mg   Bevacizumab  (AVASTIN ) SOLN 1.25 mg     Return  in about 4 weeks (around 03/02/2024) for NPDR OU - Dilated Exam, OCT, Possible Injxn.  There are no Patient Instructions on file for this visit.  Explained the diagnoses, plan, and follow up with the patient and they expressed understanding.  Patient expressed understanding of the importance of proper follow up care.   This document serves as a record of services personally performed by Redell JUDITHANN Hans, MD, PhD. It was created on their behalf by Avelina Pereyra, COA an ophthalmic technician. The creation of this record is the provider's dictation and/or activities during the visit.   Electronically signed by: Avelina GORMAN Pereyra, COT  02/07/24  12:59 AM   Redell JUDITHANN Hans, M.D., Ph.D. Diseases & Surgery of the Retina and Vitreous [facility]  I have reviewed the above documentation for accuracy and completeness, and I agree with the above. Redell JUDITHANN Hans,  M.D., Ph.D. 02/07/24 1:01 AM   Abbreviations: M myopia (nearsighted); A astigmatism; H hyperopia (farsighted); P presbyopia; Mrx spectacle prescription;  CTL contact lenses; OD right eye; OS left eye; OU both eyes  XT exotropia; ET esotropia; PEK punctate epithelial keratitis; PEE punctate epithelial erosions; DES dry eye syndrome; MGD meibomian gland dysfunction; ATs artificial tears; PFAT's preservative free artificial tears; NSC nuclear sclerotic cataract; PSC posterior subcapsular cataract; ERM epi-retinal membrane; PVD posterior vitreous detachment; RD retinal detachment; DM diabetes mellitus; DR diabetic retinopathy; NPDR non-proliferative diabetic retinopathy; PDR proliferative diabetic retinopathy; CSME clinically significant macular edema; DME diabetic macular edema; dbh dot blot hemorrhages; CWS cotton wool spot; POAG primary open angle glaucoma; C/D cup-to-disc ratio; HVF humphrey visual field; GVF goldmann visual field; OCT optical coherence tomography; IOP intraocular pressure; BRVO Branch retinal vein occlusion; CRVO central retinal vein occlusion; CRAO central retinal artery occlusion; BRAO branch retinal artery occlusion; RT retinal tear; SB scleral buckle; PPV pars plana vitrectomy; VH Vitreous hemorrhage; PRP panretinal laser photocoagulation; IVK intravitreal kenalog; VMT vitreomacular traction; MH Macular hole;  NVD neovascularization of the disc; NVE neovascularization elsewhere; AREDS age related eye disease study; ARMD age related macular degeneration; POAG primary open angle glaucoma; EBMD epithelial/anterior basement membrane dystrophy; ACIOL anterior chamber intraocular lens; IOL intraocular lens; PCIOL posterior chamber intraocular lens; Phaco/IOL phacoemulsification with intraocular lens placement; PRK photorefractive keratectomy; LASIK laser assisted in situ keratomileusis; HTN hypertension; DM diabetes mellitus; COPD chronic obstructive pulmonary disease

## 2024-01-26 LAB — ZINC: Zinc: 62 ug/dL (ref 60–130)

## 2024-02-03 ENCOUNTER — Ambulatory Visit (INDEPENDENT_AMBULATORY_CARE_PROVIDER_SITE_OTHER): Admitting: Ophthalmology

## 2024-02-03 ENCOUNTER — Encounter (INDEPENDENT_AMBULATORY_CARE_PROVIDER_SITE_OTHER): Payer: Self-pay | Admitting: Ophthalmology

## 2024-02-03 ENCOUNTER — Ambulatory Visit: Admitting: Dermatology

## 2024-02-03 DIAGNOSIS — E113313 Type 2 diabetes mellitus with moderate nonproliferative diabetic retinopathy with macular edema, bilateral: Secondary | ICD-10-CM

## 2024-02-03 DIAGNOSIS — Z794 Long term (current) use of insulin: Secondary | ICD-10-CM

## 2024-02-03 DIAGNOSIS — I10 Essential (primary) hypertension: Secondary | ICD-10-CM

## 2024-02-03 DIAGNOSIS — Z7985 Long-term (current) use of injectable non-insulin antidiabetic drugs: Secondary | ICD-10-CM | POA: Diagnosis not present

## 2024-02-03 DIAGNOSIS — H35033 Hypertensive retinopathy, bilateral: Secondary | ICD-10-CM

## 2024-02-03 DIAGNOSIS — Z961 Presence of intraocular lens: Secondary | ICD-10-CM | POA: Diagnosis not present

## 2024-02-03 MED ORDER — BEVACIZUMAB CHEMO INJECTION 1.25MG/0.05ML SYRINGE FOR KALEIDOSCOPE
1.2500 mg | INTRAVITREAL | Status: AC | PRN
  Administered 2024-02-03: 1.25 mg via INTRAVITREAL

## 2024-02-05 ENCOUNTER — Other Ambulatory Visit (HOSPITAL_BASED_OUTPATIENT_CLINIC_OR_DEPARTMENT_OTHER): Payer: Self-pay

## 2024-02-05 ENCOUNTER — Other Ambulatory Visit (HOSPITAL_COMMUNITY): Payer: Self-pay

## 2024-02-05 ENCOUNTER — Telehealth: Payer: Self-pay | Admitting: Pharmacist

## 2024-02-05 DIAGNOSIS — E119 Type 2 diabetes mellitus without complications: Secondary | ICD-10-CM

## 2024-02-05 MED ORDER — INSULIN GLARGINE SOLOSTAR 100 UNIT/ML ~~LOC~~ SOPN: 60.0000 [IU] | PEN_INJECTOR | Freq: Every day | SUBCUTANEOUS | 0 refills | Status: DC

## 2024-02-05 MED ORDER — INSULIN PEN NEEDLE 31G X 8 MM MISC
1.0000 | Freq: Every day | 2 refills | Status: AC
  Filled 2024-02-05 (×2): qty 100, 100d supply, fill #0
  Filled 2024-05-08: qty 100, 100d supply, fill #1

## 2024-02-05 MED ORDER — CLOPIDOGREL BISULFATE 75 MG PO TABS
75.0000 mg | ORAL_TABLET | Freq: Every day | ORAL | 8 refills | Status: DC
  Filled 2024-02-05: qty 30, 30d supply, fill #0
  Filled 2024-03-16: qty 30, 30d supply, fill #1

## 2024-02-05 MED ORDER — FREESTYLE LIBRE 3 READER DEVI: 5 refills | Status: DC

## 2024-02-05 MED ORDER — INSULIN PEN NEEDLE 31G X 8 MM MISC
2 refills | Status: AC
  Filled 2024-02-05 – 2024-03-17 (×2): qty 300, 75d supply, fill #0

## 2024-02-05 MED ORDER — WARFARIN SODIUM 5 MG PO TABS: 5.0000 mg | ORAL_TABLET | Freq: Every day | ORAL | 1 refills | Status: DC

## 2024-02-05 MED ORDER — ROSUVASTATIN CALCIUM 40 MG PO TABS
40.0000 mg | ORAL_TABLET | Freq: Every day | ORAL | 1 refills | Status: DC
  Filled 2024-02-05 – 2024-04-29 (×2): qty 90, 90d supply, fill #0

## 2024-02-05 MED ORDER — ICOSAPENT ETHYL 1 G PO CAPS: 2.0000 g | ORAL_CAPSULE | Freq: Two times a day (BID) | ORAL | 1 refills | Status: DC

## 2024-02-05 MED ORDER — TORSEMIDE 20 MG PO TABS
20.0000 mg | ORAL_TABLET | ORAL | 1 refills | Status: AC
  Filled 2024-03-16 – 2024-03-23 (×3): qty 36, 84d supply, fill #0

## 2024-02-05 MED ORDER — OZEMPIC (0.25 OR 0.5 MG/DOSE) 2 MG/3ML ~~LOC~~ SOPN
PEN_INJECTOR | SUBCUTANEOUS | 5 refills | Status: AC
  Filled 2024-02-05: qty 3, 28d supply, fill #0
  Filled 2024-03-16: qty 3, 28d supply, fill #1

## 2024-02-05 MED ORDER — CARVEDILOL 3.125 MG PO TABS
3.1250 mg | ORAL_TABLET | Freq: Two times a day (BID) | ORAL | 1 refills | Status: AC
  Filled 2024-02-05: qty 180, 90d supply, fill #0

## 2024-02-05 MED ORDER — INSULIN GLARGINE SOLOSTAR 100 UNIT/ML ~~LOC~~ SOPN: 55.0000 [IU] | PEN_INJECTOR | Freq: Every evening | SUBCUTANEOUS | 0 refills | Status: AC

## 2024-02-05 MED ORDER — ACCU-CHEK FASTCLIX LANCET KIT
1.0000 | PACK | 5 refills | Status: AC
Start: 2023-07-19 — End: ?

## 2024-02-05 MED FILL — Insulin Glargine Soln Pen-Injector 100 Unit/ML: SUBCUTANEOUS | 90 days supply | Qty: 36 | Fill #0 | Status: AC

## 2024-02-05 MED FILL — Continuous Glucose System Sensor: 90 days supply | Qty: 9 | Fill #0 | Status: AC

## 2024-02-05 MED FILL — Dapagliflozin Propanediol Tab 10 MG (Base Equivalent): ORAL | 90 days supply | Qty: 90 | Fill #0 | Status: AC

## 2024-02-05 MED FILL — Pantoprazole Sodium EC Tab 40 MG (Base Equiv): ORAL | 90 days supply | Qty: 180 | Fill #0 | Status: AC

## 2024-02-05 MED FILL — Glucose Blood Test Strip: 33 days supply | Qty: 100 | Fill #0 | Status: AC

## 2024-02-05 MED FILL — Rosuvastatin Calcium Tab 40 MG: ORAL | 90 days supply | Qty: 90 | Fill #0 | Status: AC

## 2024-02-05 NOTE — Telephone Encounter (Signed)
 Received call from patient's daughter, she has been having issues getting medication refills from the office or pharmacy. Upon chart review, refills have been sent but the pharmacy has not been filling them.  Discussed option of using [facility] for medications since she has been having difficulties with her current pharmacy. Will request medications be transferred. Pt needs refills of rosuvastatin  and pen needles now. Has refills of all other medications currently.  Darrelyn Drum, PharmD, BCPS, CPP Clinical Pharmacist Practitioner  Primary Care at [facility] [phone]

## 2024-02-06 ENCOUNTER — Other Ambulatory Visit: Payer: Self-pay

## 2024-02-06 ENCOUNTER — Other Ambulatory Visit (HOSPITAL_COMMUNITY): Payer: Self-pay

## 2024-02-06 MED ORDER — FREESTYLE LIBRE 2 READER DEVI: 5 refills | Status: DC

## 2024-02-07 ENCOUNTER — Other Ambulatory Visit (HOSPITAL_COMMUNITY): Payer: Self-pay

## 2024-02-17 ENCOUNTER — Ambulatory Visit (HOSPITAL_COMMUNITY)
Admission: RE | Admit: 2024-02-17 | Discharge: 2024-02-17 | Disposition: A | Discharge status: Home / Self Care | Source: Ambulatory Visit | Attending: Cardiovascular Disease | Admitting: Cardiovascular Disease

## 2024-02-17 ENCOUNTER — Ambulatory Visit (HOSPITAL_BASED_OUTPATIENT_CLINIC_OR_DEPARTMENT_OTHER)
Admission: RE | Admit: 2024-02-17 | Discharge: 2024-02-17 | Disposition: A | Discharge status: Home / Self Care | Source: Ambulatory Visit | Attending: Cardiovascular Disease | Admitting: Cardiovascular Disease

## 2024-02-17 DIAGNOSIS — I739 Peripheral vascular disease, unspecified: Secondary | ICD-10-CM

## 2024-02-17 LAB — VAS US ABI WITH/WO TBI
Left ABI: 0.86
Right ABI: 0.75

## 2024-02-18 ENCOUNTER — Other Ambulatory Visit: Payer: Self-pay

## 2024-02-18 ENCOUNTER — Other Ambulatory Visit (HOSPITAL_COMMUNITY): Payer: Self-pay

## 2024-02-19 ENCOUNTER — Other Ambulatory Visit: Payer: Self-pay | Admitting: Internal Medicine

## 2024-02-19 ENCOUNTER — Other Ambulatory Visit (HOSPITAL_COMMUNITY): Payer: Self-pay

## 2024-02-19 DIAGNOSIS — G2581 Restless legs syndrome: Secondary | ICD-10-CM

## 2024-02-19 MED ORDER — PRAMIPEXOLE DIHYDROCHLORIDE 0.125 MG PO TABS
0.1250 mg | ORAL_TABLET | Freq: Every day | ORAL | 0 refills | Status: DC
  Filled 2024-02-19: qty 90, 90d supply, fill #0

## 2024-02-19 NOTE — Telephone Encounter (Signed)
 Copied from CRM [id number]. Topic: Clinical - Medication Refill >> Feb 19, 2024  8:49 AM [place] A wrote: Medication: pramipexole  (MIRAPEX ) 0.125 MG tablet  Has the patient contacted their pharmacy? Yes (Agent: If no, request that the patient contact the pharmacy for the refill. If patient does not wish to contact the pharmacy document the reason why and proceed with request.) (Agent: If yes, when and what did the pharmacy advise?)  This is the patient's preferred pharmacy:  [place] [facility] [address] [place] [address] Phone: [phone] Fax: [phone]  Is this the correct pharmacy for this prescription? Yes If no, delete pharmacy and type the correct one.   Has the prescription been filled recently? No  Is the patient out of the medication? No 4 or 5 pills left  Has the patient been seen for an appointment in the last year OR does the patient have an upcoming appointment? Yes  Can we respond through MyChart? Yes  Agent: Please be advised that Rx refills may take up to 3 business days. We ask that you follow-up with your pharmacy.

## 2024-02-21 ENCOUNTER — Ambulatory Visit: Payer: Self-pay | Admitting: Cardiovascular Disease

## 2024-02-21 DIAGNOSIS — I739 Peripheral vascular disease, unspecified: Secondary | ICD-10-CM

## 2024-02-23 ENCOUNTER — Other Ambulatory Visit: Payer: Self-pay | Admitting: Internal Medicine

## 2024-02-23 DIAGNOSIS — G2581 Restless legs syndrome: Secondary | ICD-10-CM

## 2024-02-24 ENCOUNTER — Ambulatory Visit (INDEPENDENT_AMBULATORY_CARE_PROVIDER_SITE_OTHER)

## 2024-02-24 ENCOUNTER — Ambulatory Visit: Attending: Cardiovascular Disease

## 2024-02-24 VITALS — Ht 65.0 in | Wt 175.0 lb

## 2024-02-24 DIAGNOSIS — Z5181 Encounter for therapeutic drug level monitoring: Secondary | ICD-10-CM | POA: Diagnosis not present

## 2024-02-24 DIAGNOSIS — Z0000 Encounter for general adult medical examination without abnormal findings: Secondary | ICD-10-CM | POA: Diagnosis not present

## 2024-02-24 DIAGNOSIS — I480 Paroxysmal atrial fibrillation: Secondary | ICD-10-CM

## 2024-02-24 LAB — POCT INR: INR: 2.8 (ref 2.0–3.0)

## 2024-02-24 NOTE — Patient Instructions (Addendum)
 William [name] , Thank you for taking time out of your busy schedule to complete your Annual Wellness Visit with me. I enjoyed our conversation and look forward to speaking with you again next year. I, as well as your care team,  appreciate your ongoing commitment to your health goals. Please review the following plan we discussed and let me know if I can assist you in the future. Your Game plan/ To Do List    Follow up Visits: We will see or speak with you next year for your Next Medicare AWV with our clinical staff Have you seen your provider in the last 6 months (3 months if uncontrolled diabetes)? Yes.  Last office visit on 01/14/2024.  Clinician Recommendations:  Aim for 30 minutes of exercise or brisk walking, 6-8 glasses of water, and 5 servings of fruits and vegetables each day.       This is a list of the screenings recommended for you:  Health Maintenance  Topic Date Due   Zoster (Shingles) Vaccine (1 of 2) Never done   COVID-19 Vaccine (1 - 2024-25 season) Never done   Medicare Annual Wellness Visit  03/16/2023   Eye exam for diabetics  02/13/2024   Colon Cancer Screening  04/28/2024*   Hemoglobin A1C  05/09/2024   Complete foot exam   08/18/2024   Yearly kidney function blood test for diabetes  01/13/2025   Yearly kidney health urinalysis for diabetes  01/13/2025   DTaP/Tdap/Td vaccine (2 - Td or Tdap) 09/15/2026   Pneumococcal Vaccine for age over 93  Completed   Hepatitis C Screening  Completed   HPV Vaccine  Aged Out   Meningitis B Vaccine  Aged Out  *Topic was postponed. The date shown is not the original due date.    Advanced directives: (Declined) Advance directive discussed with you today. Even though you declined this today, please call our office should you change your mind, and we can give you the proper paperwork for you to fill out. Advance Care Planning is important because it:  [x]  Makes sure you receive the medical care that is consistent with your values,  goals, and preferences  [x]  It provides guidance to your family and loved ones and reduces their decisional burden about whether or not they are making the right decisions based on your wishes.  Follow the link provided in your after visit summary or read over the paperwork we have mailed to you to help you started getting your Advance Directives in place. If you need assistance in completing these, please reach out to us  so that we can help you!  See attachments for Preventive Care and Fall Prevention Tips.

## 2024-02-24 NOTE — Patient Instructions (Signed)
 continue taking 1.5 tablets daily except for 1 tablet every Mondays and Fridays.  Eat greens tonight. Recheck INR in 5 weeks.  Stay consistent with greens [facility] [phone]

## 2024-02-24 NOTE — Progress Notes (Signed)
 Subjective:   William [name] is a 73 y.o. who presents for a Medicare Wellness preventive visit.  As a reminder, Annual Wellness Visits don't include a physical exam, and some assessments may be limited, especially if this visit is performed virtually. We may recommend an in-person follow-up visit with your provider if needed.  Visit Complete: Virtual I connected with  William [name] on 02/24/24 by a audio enabled telemedicine application and verified that I am speaking with the correct person using two identifiers.  Patient Location: Home  Provider Location: Home Office  I discussed the limitations of evaluation and management by telemedicine. The patient expressed understanding and agreed to proceed.  Vital Signs: Because this visit was a virtual/telehealth visit, some criteria may be missing or patient reported. Any vitals not documented were not able to be obtained and vitals that have been documented are patient reported.  VideoDeclined- This patient declined Librarian, academic. Therefore the visit was completed with audio only.  Persons Participating in Visit: Patient assisted by his daughter, Rosaline).  AWV Questionnaire: No: Patient Medicare AWV questionnaire was not completed prior to this visit.  Cardiac Risk Factors include: advanced age (>80men, >60 women);male gender;hypertension;Other (see comment);diabetes mellitus, Risk factor comments: A-Fib, CAD, CKD stage 3b     Objective:    Today's Vitals   02/24/24 1312  Weight: 175 lb (79.4 kg)  Height: 5' 5 (1.651 m)   Body mass index is 29.12 kg/m.     02/24/2024    1:25 PM 10/01/2023    6:08 PM 07/17/2023    8:44 AM 06/04/2023    8:34 PM 03/15/2022    3:54 PM 11/07/2021    6:20 AM 11/06/2021    1:42 PM  Advanced Directives  Does Patient Have a Medical Advance Directive? No No No No No No No  Would patient like information on creating a medical advance directive?  No - Patient declined No -  Patient declined No - Patient declined No - Patient declined No - Patient declined     Current Medications (verified) Outpatient Encounter Medications as of 02/24/2024  Medication Sig   acetaminophen  (TYLENOL ) 500 MG tablet Take 500 mg by mouth every 6 (six) hours as needed for moderate pain.   aspirin  EC 81 MG tablet Take 1 tablet (81 mg total) by mouth daily. Swallow whole.   Blood Glucose Monitoring Suppl DEVI Use to test blood sugar morning, at noon, and at bedtime.   carvedilol  (COREG ) 3.125 MG tablet Take 1 tablet (3.125 mg total) by mouth 2 (two) times daily with a meal.   Continuous Glucose Receiver (DEXCOM G7 RECEIVER) DEVI Use to monitor blood sugar.   Continuous Glucose Sensor (DEXCOM G7 SENSOR) MISC INJECT 1 SENSOR EVERY 10 DAYS TO MONITOR BLOOD SUGAR   dapagliflozin  propanediol (FARXIGA ) 10 MG TABS tablet Take 1 tablet (10 mg total) by mouth daily.   gabapentin  (NEURONTIN ) 300 MG capsule TAKE 1 CAPSULE(300 MG) BY MOUTH TWICE DAILY   glucose blood (ACCU-CHEK GUIDE TEST) test strip USE TO CHECK BLOOD SUGAR IN THE AM, AT NOON AND AT BEDTIME   icosapent  Ethyl (VASCEPA ) 1 g capsule Take 2 capsules (2 g total) by mouth 2 (two) times daily.   icosapent  Ethyl (VASCEPA ) 1 g capsule Take 2 capsules (2 g total) by mouth 2 (two) times daily.   insulin  glargine (LANTUS  SOLOSTAR) 100 UNIT/ML Solostar Pen ADMINISTER 40 UNITS UNDER THE SKIN AT BEDTIME   Insulin  Glargine Solostar (LANTUS ) 100 UNIT/ML Solostar Pen Inject  55 Units into the skin every evening.   Insulin  Pen Needle 31G X 8 MM MISC Use once daily as directed.   Insulin  Pen Needle 31G X 8 MM MISC Use to inject insulin  four times daily.   Lancets Misc. (ACCU-CHEK FASTCLIX LANCET) KIT Use in the morning, noon and at bedtime.   OZEMPIC , 0.25 OR 0.5 MG/DOSE, 2 MG/3ML SOPN Inject 0.25 mg into the skin once a week.   pantoprazole  (PROTONIX ) 40 MG tablet TAKE 1 TABLET(40 MG) BY MOUTH TWICE DAILY BEFORE A MEAL   pramipexole  (MIRAPEX ) 0.125 MG  tablet Take 1 tablet (0.125 mg total) by mouth at bedtime.   rosuvastatin  (CRESTOR ) 40 MG tablet TAKE 1 TABLET BY MOUTH EVERY DAY   rosuvastatin  (CRESTOR ) 40 MG tablet Take 1 tablet (40 mg total) by mouth daily.   Semaglutide ,0.25 or 0.5MG /DOS, (OZEMPIC , 0.25 OR 0.5 MG/DOSE,) 2 MG/3ML SOPN 0.25 mg once a week for 28 days, THEN 0.5 mg once a week for 28 days.   torsemide  (DEMADEX ) 20 MG tablet Take 20 mg by mouth 2 (two) times a week.   torsemide  (DEMADEX ) 20 MG tablet Take 1 tablet (20 mg total) by mouth 3 (three) times a week.   warfarin (COUMADIN ) 5 MG tablet TAKE 1 TO 1 AND 1/2 TABLETS BY MOUTH DAILY AS DIRECTED BY COUMADIN  CLINIC   warfarin (COUMADIN ) 5 MG tablet Take 1-1.5 tablets (5-7.5 mg total) by mouth daily as directed by coumadin  clinic   clopidogrel  (PLAVIX ) 75 MG tablet Take 1 tablet (75 mg total) by mouth daily.   Continuous Glucose Receiver (FREESTYLE LIBRE 2 READER) DEVI Use to check blood sugar daily.   Insulin  Glargine Solostar (LANTUS ) 100 UNIT/ML Solostar Pen Inject 60 Units into the skin daily.   No facility-administered encounter medications on file as of 02/24/2024.    Allergies (verified) Morphine and codeine  and Latex   History: Past Medical History:  Diagnosis Date   AKI (acute kidney injury) ([name])    With STEMI in 2017   Chronic systolic CHF (congestive heart failure) ([name])    Depression    Diabetes mellitus without complication ([name])    Diabetic retinopathy ([name])    GERD (gastroesophageal reflux disease)    Hx of adenomatous colonic polyps 04/07/2018   Hyperlipidemia    Hypertension    Hypertensive retinopathy    Paroxysmal atrial fibrillation ([name])    Peripheral vascular disease ([name])    Pneumonia    Seizures ([name])    hx of as a child   STEMI (ST elevation myocardial infarction) ([name]) 2017   Past Surgical History:  Procedure Laterality Date   ABDOMINAL AORTOGRAM W/LOWER EXTREMITY N/A 09/19/2016   Procedure: Abdominal Aortogram w/Lower Extremity;   Surgeon: Deatrice DELENA Cage, MD;  Location: [facility];  Service: Cardiovascular;  Laterality: N/A;   ABDOMINAL AORTOGRAM W/LOWER EXTREMITY N/A 07/17/2023   Procedure: ABDOMINAL AORTOGRAM W/LOWER EXTREMITY;  Surgeon: Cage Deatrice DELENA, MD;  Location: [facility];  Service: Cardiovascular;  Laterality: N/A;   AMPUTATION Right 03/23/2016   Procedure: 1st and 2nd Ray Amputation Right Foot;  Surgeon: Jerona LULLA Sage, MD;  Location: [facility] OR;  Service: Orthopedics;  Laterality: Right;   AMPUTATION Right 06/21/2016   Procedure: RIGHT TRANSMETATARSAL AMPUTATION;  Surgeon: Jerona LULLA Sage, MD;  Location: [facility] OR;  Service: Orthopedics;  Laterality: Right;   CARDIAC CATHETERIZATION N/A 03/13/2016   Procedure: Right/Left Heart Cath and Coronary Angiography;  Surgeon: Ozell Fell, MD;  Location: [facility] INVASIVE CV LAB;  Service:  Cardiovascular;  Laterality: N/A;   CARDIAC CATHETERIZATION N/A 03/13/2016   Procedure: IABP Insertion;  Surgeon: Ozell Fell, MD;  Location: [facility] IN[facility]ASIVE CV [facility];  Service: Cardiovascular;  Laterality: N/A;   CATARACT EXTRACTION     CATARACT EXTRACTION, BILATERAL     CERVICAL FUSION  1982, 1992   has had 3 neck surgeries from breaking his neck   CIRCUMCISION N/A 11/07/2021   Procedure: CIRCUMCISION ADULT;  Surgeon: Lovie Arlyss CROME, MD;  Location: [facility] ORS;  Service: Urology;  Laterality: N/A;   CORONARY ARTERY BYPASS GRAFT N/A 03/13/2016   Procedure: CORONARY ARTERY BYPASS GRAFTING (CABG) x 1 (SVG to OM) with EVH from LEFT GREATER SAPHENOUS VEIN;  Surgeon: Maude Fleeta Ochoa, MD;  Location: [facility] OR;  Service: Open Heart Surgery;  Laterality: N/A;   EYE SURGERY     LOWER EXTREMITY ANGIOGRAM  05/02/2016   Procedure: Lower Extremity Angiogram;  Surgeon: Deatrice DELENA Cage, MD;  Location: [facility];  Service: Cardiovascular;;  Limited left femoral runoff right femoral runoff   PERIPHERAL VASCULAR BALLOON ANGIOPLASTY  07/17/2023   Procedure: PERIPHERAL VASCULAR BALLOON ANGIOPLASTY;   Surgeon: Cage Deatrice DELENA, MD;  Location: [facility];  Service: Cardiovascular;;   PERIPHERAL VASCULAR CATHETERIZATION N/A 05/02/2016   Procedure: Abdominal Aortogram;  Surgeon: Deatrice DELENA Cage, MD;  Location: [facility];  Service: Cardiovascular;  Laterality: N/A;   PERIPHERAL VASCULAR CATHETERIZATION Right 05/02/2016   Procedure: Peripheral Vascular Balloon Angioplasty;  Surgeon: Deatrice DELENA Cage, MD;  Location: [facility];  Service: Cardiovascular;  Laterality: Right;  SFA   TEE WITHOUT CARDIOVERSION N/A 03/13/2016   Procedure: TRANSESOPHAGEAL ECHOCARDIOGRAM (TEE);  Surgeon: Maude Fleeta Ochoa, MD;  Location: [facility] OR;  Service: Open Heart Surgery;  Laterality: N/A;   VSD REPAIR N/A 03/13/2016   Procedure: VENTRICULAR SEPTAL DEFECT (VSD) REPAIR;  Surgeon: Maude Fleeta Ochoa, MD;  Location: [facility] OR;  Service: Open Heart Surgery;  Laterality: N/A;   Family History  Problem Relation Age of Onset   Diabetes Maternal Grandmother    Diabetes Mother    Aneurysm Mother    Peripheral Artery Disease Mother    Coronary artery disease Mother    Peptic Ulcer Father    Retinoblastoma Daughter    Colon cancer Neg Hx    Rectal cancer Neg Hx    Social History   Socioeconomic History   Marital status: Married    Spouse name: Mickey   Number of children: Not on file   Years of education: Not on file   Highest education level: Not on file  Occupational History   Occupation: RETIRED  Tobacco Use   Smoking status: Former    Current packs/day: 0.00    Types: Cigarettes    Quit date: 11/20/2015    Years since quitting: 8.2   Smokeless tobacco: Never   Tobacco comments:    quit 2018  Vaping Use   Vaping status: Never Used  Substance and Sexual Activity   Alcohol use: No   Drug use: No   Sexual activity: Not Currently  Other Topics Concern   Not on file  Social History Narrative   He is married he is from [place]  and worked in multiple jobs Pharmacist, 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  He is disabled from a neck fracture in 1994.  1 son and 2 daughters.      He is illiterate.      No alcohol or caffeine or drug use or other  tobacco he is a former smoker.      Lives with wife and daughter comes everyday/2025   Social Drivers of Health   Financial Resource Strain: Low Risk  (02/24/2024)   Overall Financial Resource Strain (CARDIA)    Difficulty of Paying Living Expenses: Not very hard  Food Insecurity: No Food Insecurity (02/24/2024)   Hunger Vital Sign    Worried About Running Out of Food in the Last Year: Never true    Ran Out of Food in the Last Year: Never true  Transportation Needs: No Transportation Needs (02/24/2024)   PRAPARE - Administrator, Civil Service (Medical): No    Lack of Transportation (Non-Medical): No  Physical Activity: Inactive (02/24/2024)   Exercise Vital Sign    Days of Exercise per Week: 0 days    Minutes of Exercise per Session: 0 min  Stress: No Stress Concern Present (02/24/2024)   [employer] of Occupational Health - Occupational Stress Questionnaire    Feeling of Stress: Not at all  Social Connections: Moderately Isolated (02/24/2024)   Social Connection and Isolation Panel    Frequency of Communication with Friends and Family: More than three times a week    Frequency of Social Gatherings with Friends and Family: More than three times a week    Attends Religious Services: Never    Database administrator or Organizations: No    Attends Engineer, structural: Never    Marital Status: Married    Tobacco Counseling Counseling given: Not Answered Tobacco comments: quit 2018    Clinical Intake:  Pre-visit preparation completed: Yes  Pain : No/denies pain     BMI - recorded: 29.12 Nutritional Status: BMI 25 -29 Overweight Diabetes: Yes CBG done?: Yes (151 per pt) CBG resulted in Enter/ Edit results?: No Did pt. bring in CBG monitor from home?: No  Lab Results   Component Value Date   HGBA1C 6.5 (A) 11/07/2023   HGBA1C 6.5 08/19/2023   HGBA1C 8.0 (H) 04/29/2023     How often do you need to have someone help you when you read instructions, pamphlets, or other written materials from your doctor or pharmacy?: 5 - Always  Interpreter Needed?: No  Information entered by :: William [name], William [name]   Activities of Daily Living     02/24/2024    1:16 PM 07/17/2023    8:38 AM  In your present state of health, do you have any difficulty performing the following activities:  Hearing? 1 1  Comment Wears a hearing aide right ear only  Vision? 0 0  Difficulty concentrating or making decisions? 0 0  Walking or climbing stairs? 0   Dressing or bathing? 0   Doing errands, shopping? 0   Comment daughter drives him around   Quarry manager and eating ? N   Using the Toilet? N   In the past six months, have you accidently leaked urine? N   Do you have problems with loss of bowel control? Y   Managing your Medications? N   Managing your Finances? Y   Comment daughter helps him   Housekeeping or managing your Housekeeping? N     Patient Care Team: Joshua Debby CROME, MD as PCP - General (Internal Medicine) Darron Deatrice LABOR, MD as PCP - [facility] Cardiology (Cardiology) Darron Deatrice LABOR, MD as PCP - Cardiology (Cardiology) Valdemar Rogue, MD as Consulting Physician (Ophthalmology)  I have updated your Care Teams any recent Medical Services you may have received from  other providers in the past year.     Assessment:   This is a routine wellness examination for William [name].  Hearing/Vision screen Hearing Screening - Comments:: Wears a hearing aide Vision Screening - Comments:: Denies vision issues. Dr. Valdemar    Goals Addressed   None    Depression Screen     02/24/2024    1:28 PM 11/07/2023   10:06 AM 08/19/2023    1:18 PM 04/29/2023   10:15 AM 03/15/2022    3:59 PM 07/10/2021    3:34 PM 10/29/2019    3:14 PM  PHQ 2/9 Scores  PHQ - 2 Score 0 0 0 0 0 0 0   PHQ- 9 Score 1          Fall Risk     02/24/2024    1:25 PM 11/07/2023   10:06 AM 08/19/2023    1:18 PM 04/29/2023   10:15 AM 03/15/2022    3:55 PM  Fall Risk   Falls in the past year? 0 0 0 0 0  Number falls in past yr: 0 0 0 0 0  Injury with Fall? 0 0 0 0 0  Risk for fall due to :  No Fall Risks No Fall Risks No Fall Risks No Fall Risks  Follow up Falls evaluation completed;Falls prevention discussed Falls evaluation completed Falls evaluation completed Falls evaluation completed Falls prevention discussed      Data saved with a previous flowsheet row definition    MEDICARE RISK AT HOME:  Medicare Risk at Home Any stairs in or around the home?: No If so, are there any without handrails?: No Home free of loose throw rugs in walkways, pet beds, electrical cords, etc?: Yes Adequate lighting in your home to reduce risk of falls?: Yes Life alert?: No Use of a cane, walker or w/c?: Yes Grab bars in the bathroom?: Yes Shower chair or bench in shower?: Yes Elevated toilet seat or a handicapped toilet?: Yes  TIMED UP AND GO:  Was the test performed?  No  Cognitive Function: Declined/Normal: No cognitive concerns noted by patient or family. Patient alert, oriented, able to answer questions appropriately and recall recent events. No signs of memory loss or confusion.    03/15/2022    4:14 PM  MMSE - Mini Mental State Exam  Not completed: Refused        Immunizations Immunization History  Administered Date(s) Administered   Fluad Trivalent(High Dose 65+) 04/29/2023   Influenza,inj,Quad PF,6+ Mos 03/23/2016, 03/20/2017   Influenza-Unspecified 05/30/2021   Pneumococcal Conjugate-13 09/14/2016   Pneumococcal Polysaccharide-23 12/02/2017   Tdap 09/14/2016    Screening Tests Health Maintenance  Topic Date Due   Zoster Vaccines- Shingrix  (1 of 2) Never done   COVID-19 Vaccine (1 - 2024-25 season) Never done   Medicare Annual Wellness (AWV)  03/16/2023   OPHTHALMOLOGY EXAM   02/13/2024   Colonoscopy  04/28/2024 (Originally 04/07/2021)   HEMOGLOBIN A1C  05/09/2024   FOOT EXAM  08/18/2024   Diabetic kidney evaluation - eGFR measurement  01/13/2025   Diabetic kidney evaluation - Urine ACR  01/13/2025   DTaP/Tdap/Td (2 - Td or Tdap) 09/15/2026   Pneumococcal Vaccine: 50+ Years  Completed   Hepatitis C Screening  Completed   HPV VACCINES  Aged Out   Meningococcal B Vaccine  Aged Out    Health Maintenance  Health Maintenance Due  Topic Date Due   Zoster Vaccines- Shingrix  (1 of 2) Never done   COVID-19 Vaccine (1 - 2024-25 season)  Never done   Medicare Annual Wellness (AWV)  03/16/2023   OPHTHALMOLOGY EXAM  02/13/2024   Health Maintenance Items Addressed: See Nurse Notes at the end of this note  Additional Screening:  Vision Screening: Recommended annual ophthalmology exams for early detection of glaucoma and other disorders of the eye. Would you like a referral to an eye doctor? No    Dental Screening: Recommended annual dental exams for proper oral hygiene  Community Resource Referral / Chronic Care Management: CRR required this visit?  No   CCM required this visit?  No   Plan:    I have personally reviewed and noted the following in the patient's chart:   Medical and social history Use of alcohol, tobacco or illicit drugs  Current medications and supplements including opioid prescriptions. Patient is not currently taking opioid prescriptions. Functional ability and status Nutritional status Physical activity Advanced directives List of other physicians Hospitalizations, surgeries, and ER visits in previous 12 months Vitals Screenings to include cognitive, depression, and falls Referrals and appointments  In addition, I have reviewed and discussed with patient certain preventive protocols, quality metrics, and best practice recommendations. A written personalized care plan for preventive services as well as general preventive health  recommendations were provided to patient.   Madalyne Husk L Zakari Couchman, CMA   02/24/2024   After Visit Summary: (MyChart) Due to this being a telephonic visit, the after visit summary with patients personalized plan was offered to patient via MyChart   Notes: Patient is due for a colonoscopy, however, he would like to discuss this with [name] during his next office visit.  Patient has concerns about his bowels and id requesting a referral to see GI.  Patient declines the Shingrix  vaccine.  Patient stated that he has an appointment with eye doctor on 03/09/24.

## 2024-02-24 NOTE — Progress Notes (Signed)
 INR 2.8 Please see anticoagulation encounter continue taking 1.5 tablets daily except for 1 tablet every Mondays and Fridays.  Eat greens tonight. Recheck INR in 5 weeks.  Stay consistent with greens [facility] [phone]

## 2024-02-25 ENCOUNTER — Ambulatory Visit (INDEPENDENT_AMBULATORY_CARE_PROVIDER_SITE_OTHER): Admitting: Family

## 2024-02-25 ENCOUNTER — Encounter: Payer: Self-pay | Admitting: Family

## 2024-02-25 DIAGNOSIS — B351 Tinea unguium: Secondary | ICD-10-CM | POA: Diagnosis not present

## 2024-02-25 NOTE — Progress Notes (Signed)
 Office Visit Note   Patient: William [name]           Date of Birth: Nov 20, 1950           MRN: [id number] Visit Date: 02/25/2024              Requested by: Joshua Debby CROME, MD [address] [place],  [place] [address] PCP: Joshua Debby CROME, MD  No chief complaint on file.     HPI: The patient is a 73 year old gentleman who presents today with concern of thickened and painful great toenail on the left foot he is afraid it may fall off  Assessment & Plan: Visit Diagnoses: No diagnosis found.  Plan: Nails trimmed x 5 left foot patient tolerated well.  He will follow-up in the office in 3 months  Follow-Up Instructions: No follow-ups on file.   Ortho Exam  Patient is alert, oriented, no adenopathy, well-dressed, normal affect, normal respiratory effort. On examination left foot there is no edema no erythema no open area of the patient has thickened and discolored onychomycotic nails x 5 these were trimmed following informed consent patient tolerated well.  Patient voiced relief.    Imaging: No results found. No images are attached to the encounter.  Labs: Lab Results  Component Value Date   HGBA1C 6.5 (A) 11/07/2023   HGBA1C 6.5 08/19/2023   HGBA1C 8.0 (H) 04/29/2023   REPTSTATUS 06/09/2023 FINAL 06/04/2023   GRAMSTAIN  03/14/2016    RARE WBC PRESENT, PREDOMINANTLY PMN NO ORGANISMS SEEN    CULT  06/04/2023    NO GROWTH 5 DAYS Performed at [facility], [address], [place], [place] [address]    LABORGA PSEUDOMONAS AERUGINOSA 03/14/2016     Lab Results  Component Value Date   ALBUMIN  3.8 10/01/2023   ALBUMIN  3.4 (L) 06/06/2023   ALBUMIN  3.5 06/05/2023    Lab Results  Component Value Date   MG 2.3 06/22/2016   MG 2.2 03/30/2016   MG 1.7 03/23/2016   No results found for: VD25OH  No results found for: PREALBUMIN    Latest Ref Rng & Units 01/14/2024    1:58 PM 10/01/2023    6:31 PM 10/01/2023    6:08 PM  CBC EXTENDED  WBC 4.0 - 10.5 K/uL  9.7   5.7   RBC 4.22 - 5.81 Mil/uL 4.70   4.36   Hemoglobin 13.0 - 17.0 g/dL 85.8  87.3  87.1   HCT 39.0 - 52.0 % 40.7  37.0  38.3   Platelets 150.0 - 400.0 K/uL 176.0   109   NEUT# 1.4 - 7.7 K/uL 7.6   4.3   Lymph# 0.7 - 4.0 K/uL 1.6   1.0      There is no height or weight on file to calculate BMI.  Orders:  No orders of the defined types were placed in this encounter.  No orders of the defined types were placed in this encounter.    Procedures: No procedures performed  Clinical Data: No additional findings.  ROS:  All other systems negative, except as noted in the HPI. Review of Systems  Objective: Vital Signs: There were no vitals taken for this visit.  Specialty Comments:  No specialty comments available.  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 edema associated with type 2 diabetes mellitus ([name]) 05/09/2022   Prostate cancer screening 05/09/2022   Encounter for general adult medical examination with abnormal findings 11/28/2020   Insulin -requiring or dependent type II diabetes mellitus ([name]) 11/22/2020   Hyperlipidemia LDL goal <70 11/22/2020   Tobacco dependence 03/24/2017   Type 2 diabetes mellitus with stage 3b chronic kidney disease, without long-term current use of insulin  ([name]) 09/17/2016   Peripheral vascular disorder ([name]) 09/17/2016   CKD stage 3b, GFR 30-44 ml/min ([name])    Chronic heart failure with mildly reduced ejection fraction (HFmrEF, 41-49%) ([name]) 05/28/2016   Coronary artery disease due to lipid rich plaque 03/16/2016   VSD (ventricular septal defect)  03/16/2016   Paroxysmal atrial fibrillation ([name]) 03/15/2016   Erectile dysfunction associated with type 2 diabetes mellitus ([name]) 04/07/2012   Mood disorder ([name]) 03/12/2011   Essential hypertension 03/17/2010   GERD 11/01/2008   Past Medical History:  Diagnosis Date   AKI (acute kidney injury) ([name])    With STEMI in 2017   Chronic systolic CHF (congestive heart failure) ([name])    Depression    Diabetes mellitus without complication ([name])    Diabetic retinopathy ([name])    GERD (gastroesophageal reflux disease)    Hx of adenomatous colonic polyps 04/07/2018   Hyperlipidemia    Hypertension    Hypertensive retinopathy    Paroxysmal atrial fibrillation ([name])    Peripheral vascular disease ([name])    Pneumonia    Seizures ([name])    hx of as a child   STEMI (ST elevation myocardial infarction) ([name]) 2017    Family History  Problem Relation Age of Onset   Diabetes Maternal Grandmother    Diabetes Mother    Aneurysm Mother    Peripheral Artery Disease Mother    Coronary artery disease Mother    Peptic Ulcer Father    Retinoblastoma Daughter    Colon cancer Neg Hx    Rectal cancer Neg Hx     Past Surgical History:  Procedure Laterality Date   ABDOMINAL AORTOGRAM W/LOWER EXTREMITY N/A 09/19/2016   Procedure: Abdominal Aortogram w/Lower Extremity;  Surgeon: Deatrice DELENA Cage, MD;  Location: [facility];  Service: Cardiovascular;  Laterality: N/A;   ABDOMINAL AORTOGRAM W/LOWER EXTREMITY N/A 07/17/2023   Procedure: ABDOMINAL AORTOGRAM W/LOWER EXTREMITY;  Surgeon: Cage Deatrice DELENA, MD;  Location: [facility];  Service: Cardiovascular;  Laterality: N/A;   AMPUTATION Right 03/23/2016   Procedure: 1st and 2nd Ray Amputation Right Foot;  Surgeon: Jerona LULLA Sage, MD;  Location: [facility];  Service: Orthopedics;  Laterality: Right;   AMPUTATION Right 06/21/2016   Procedure: RIGHT TRANSMETATARSAL AMPUTATION;  Surgeon: Jerona LULLA Sage, MD;  Location: [facility] OR;  Service: Orthopedics;   Laterality: Right;   CARDIAC CATHETERIZATION N/A 03/13/2016   Procedure: Right/Left Heart Cath and Coronary Angiography;  Surgeon: Ozell Fell, MD;  Location: [facility];  Service: Cardiovascular;  Laterality: N/A;   CARDIAC CATHETERIZATION N/A 03/13/2016   Procedure: IABP Insertion;  Surgeon: Ozell Fell, MD;  Location: [facility];  Service: Cardiovascular;  Laterality: N/A;   CATARACT EXTRACTION     CATARACT EXTRACTION, BILATERAL     CERVICAL FUSION  1982, 1992   has had 3 neck surgeries from breaking his neck   CIRCUMCISION N/A 11/07/2021   Procedure: CIRCUMCISION ADULT;  Surgeon: Lovie Molly  L, MD;  Location: [facility];  Service: Urology;  Laterality: N/A;   CORONARY ARTERY BYPASS GRAFT N/A 03/13/2016   Procedure: CORONARY ARTERY BYPASS GRAFTING (CABG) x 1 (SVG to OM) with EVH from LEFT GREATER SAPHENOUS VEIN;  Surgeon: Maude Fleeta Ochoa, MD;  Location: [facility] OR;  Service: Open Heart Surgery;  Laterality: N/A;   EYE SURGERY     LOWER EXTREMITY ANGIOGRAM  05/02/2016   Procedure: Lower Extremity Angiogram;  Surgeon: Deatrice DELENA Cage, MD;  Location: [facility];  Service: Cardiovascular;;  Limited left femoral runoff right femoral runoff   PERIPHERAL VASCULAR BALLOON ANGIOPLASTY  07/17/2023   Procedure: PERIPHERAL VASCULAR BALLOON ANGIOPLASTY;  Surgeon: Cage Deatrice DELENA, MD;  Location: [facility];  Service: Cardiovascular;;   PERIPHERAL VASCULAR CATHETERIZATION N/A 05/02/2016   Procedure: Abdominal Aortogram;  Surgeon: Deatrice DELENA Cage, MD;  Location: [facility];  Service: Cardiovascular;  Laterality: N/A;   PERIPHERAL VASCULAR CATHETERIZATION Right 05/02/2016   Procedure: Peripheral Vascular Balloon Angioplasty;  Surgeon: Deatrice DELENA Cage, MD;  Location: [facility];  Service: Cardiovascular;  Laterality: Right;  SFA   TEE WITHOUT CARDIOVERSION N/A 03/13/2016   Procedure: TRANSESOPHAGEAL ECHOCARDIOGRAM (TEE);  Surgeon: Maude Fleeta Ochoa, MD;   Location: [facility] OR;  Service: Open Heart Surgery;  Laterality: N/A;   VSD REPAIR N/A 03/13/2016   Procedure: VENTRICULAR SEPTAL DEFECT (VSD) REPAIR;  Surgeon: Maude Fleeta Ochoa, MD;  Location: [facility] OR;  Service: Open Heart Surgery;  Laterality: N/A;   Social History   Occupational History   Occupation: RETIRED  Tobacco Use   Smoking status: Former    Current packs/day: 0.00    Types: Cigarettes    Quit date: 11/20/2015    Years since quitting: 8.2   Smokeless tobacco: Never   Tobacco comments:    quit 2018  Vaping Use   Vaping status: Never Used  Substance and Sexual Activity   Alcohol use: No   Drug use: No   Sexual activity: Not Currently

## 2024-02-27 NOTE — Progress Notes (Signed)
 [facility] - Clinic Note  03/03/2024    CHIEF COMPLAINT Patient presents for Retina Follow Up  HISTORY OF PRESENT ILLNESS: William [name] is a 73 y.o. male who presents to the clinic today for:   HPI     Retina Follow Up   Patient presents with  Diabetic Retinopathy.  In both eyes.  Severity is moderate.  Duration of 4 weeks.  Since onset it is stable.  I, the attending physician,  performed the HPI with the patient and updated documentation appropriately.        Comments   Patient states that the vision is the same but the right eye is sore due to being poke in the eye.       Last edited by Valdemar Rogue, MD on 03/03/2024  5:08 PM.      Pt states vision is doing well, pts daughter states he is not eating correctly, which is affecting his blood sugar  Referring physician: Joshua Debby CROME, MD [address] [place],  [place] [address]  HISTORICAL INFORMATION:  Selected notes from the MEDICAL RECORD NUMBER Referred by Dr. Lelon for eval of DME OU LEE:  Ocular Hx- PMH-    CURRENT MEDICATIONS: No current outpatient medications on file. (Ophthalmic Drugs)   No current facility-administered medications for this visit. (Ophthalmic Drugs)   Current Outpatient Medications (Other)  Medication Sig   acetaminophen  (TYLENOL ) 500 MG tablet Take 500 mg by mouth every 6 (six) hours as needed for moderate pain.   aspirin  EC 81 MG tablet Take 1 tablet (81 mg total) by mouth daily. Swallow whole.   Blood Glucose Monitoring Suppl DEVI Use to test blood sugar morning, at noon, and at bedtime.   carvedilol  (COREG ) 3.125 MG tablet Take 1 tablet (3.125 mg total) by mouth 2 (two) times daily with a meal.   clopidogrel  (PLAVIX ) 75 MG tablet Take 1 tablet (75 mg total) by mouth daily.   Continuous Glucose Receiver (DEXCOM G7 RECEIVER) DEVI Use to monitor blood sugar.   Continuous Glucose Receiver (FREESTYLE LIBRE 2 READER) DEVI Use to check blood sugar daily.   Continuous  Glucose Sensor (DEXCOM G7 SENSOR) MISC INJECT 1 SENSOR EVERY 10 DAYS TO MONITOR BLOOD SUGAR   dapagliflozin  propanediol (FARXIGA ) 10 MG TABS tablet Take 1 tablet (10 mg total) by mouth daily.   gabapentin  (NEURONTIN ) 300 MG capsule TAKE 1 CAPSULE(300 MG) BY MOUTH TWICE DAILY   glucose blood (ACCU-CHEK GUIDE TEST) test strip USE TO CHECK BLOOD SUGAR IN THE AM, AT NOON AND AT BEDTIME   icosapent  Ethyl (VASCEPA ) 1 g capsule Take 2 capsules (2 g total) by mouth 2 (two) times daily.   icosapent  Ethyl (VASCEPA ) 1 g capsule Take 2 capsules (2 g total) by mouth 2 (two) times daily.   insulin  glargine (LANTUS  SOLOSTAR) 100 UNIT/ML Solostar Pen ADMINISTER 40 UNITS UNDER THE SKIN AT BEDTIME   Insulin  Glargine Solostar (LANTUS ) 100 UNIT/ML Solostar Pen Inject 60 Units into the skin daily.   Insulin  Glargine Solostar (LANTUS ) 100 UNIT/ML Solostar Pen Inject 55 Units into the skin every evening.   Insulin  Pen Needle 31G X 8 MM MISC Use once daily as directed.   Insulin  Pen Needle 31G X 8 MM MISC Use to inject insulin  four times daily.   Lancets Misc. (ACCU-CHEK FASTCLIX LANCET) KIT Use in the morning, noon and at bedtime.   OZEMPIC , 0.25 OR 0.5 MG/DOSE, 2 MG/3ML SOPN Inject 0.25 mg into the skin once a week.  pantoprazole  (PROTONIX ) 40 MG tablet TAKE 1 TABLET(40 MG) BY MOUTH TWICE DAILY BEFORE A MEAL   pramipexole  (MIRAPEX ) 0.125 MG tablet Take 1 tablet (0.125 mg total) by mouth at bedtime.   rosuvastatin  (CRESTOR ) 40 MG tablet TAKE 1 TABLET BY MOUTH EVERY DAY   rosuvastatin  (CRESTOR ) 40 MG tablet Take 1 tablet (40 mg total) by mouth daily.   Semaglutide ,0.25 or 0.5MG /DOS, (OZEMPIC , 0.25 OR 0.5 MG/DOSE,) 2 MG/3ML SOPN 0.25 mg once a week for 28 days, THEN 0.5 mg once a week for 28 days.   torsemide  (DEMADEX ) 20 MG tablet Take 20 mg by mouth 2 (two) times a week.   torsemide  (DEMADEX ) 20 MG tablet Take 1 tablet (20 mg total) by mouth 3 (three) times a week.   warfarin (COUMADIN ) 5 MG tablet TAKE 1 TO 1 AND 1/2  TABLETS BY MOUTH DAILY AS DIRECTED BY COUMADIN  CLINIC   warfarin (COUMADIN ) 5 MG tablet Take 1-1.5 tablets (5-7.5 mg total) by mouth daily as directed by coumadin  clinic   No current facility-administered medications for this visit. (Other)   REVIEW OF SYSTEMS: ROS   Positive for: Gastrointestinal, Genitourinary, Endocrine, Eyes Negative for: Constitutional, Neurological, Skin, Musculoskeletal, HENT, Cardiovascular, Respiratory, Psychiatric, Allergic/Imm, Heme/Lymph Last edited by Myra Wanda SAILOR, COT on 03/03/2024  8:03 AM.       ALLERGIES Allergies  Allergen Reactions   Morphine And Codeine  Shortness Of Breath and Other (See Comments)    UNSPECIFIED REACTION Pt said it was too much    Latex Rash   PAST MEDICAL HISTORY Past Medical History:  Diagnosis Date   AKI (acute kidney injury) ([name])    With STEMI in 2017   Chronic systolic CHF (congestive heart failure) ([name])    Depression    Diabetes mellitus without complication ([name])    Diabetic retinopathy ([name])    GERD (gastroesophageal reflux disease)    Hx of adenomatous colonic polyps 04/07/2018   Hyperlipidemia    Hypertension    Hypertensive retinopathy    Paroxysmal atrial fibrillation ([name])    Peripheral vascular disease ([name])    Pneumonia    Seizures ([name])    hx of as a child   STEMI (ST elevation myocardial infarction) ([name]) 2017   Past Surgical History:  Procedure Laterality Date   ABDOMINAL AORTOGRAM W/LOWER EXTREMITY N/A 09/19/2016   Procedure: Abdominal Aortogram w/Lower Extremity;  Surgeon: Deatrice DELENA Cage, MD;  Location: [facility];  Service: Cardiovascular;  Laterality: N/A;   ABDOMINAL AORTOGRAM W/LOWER EXTREMITY N/A 07/17/2023   Procedure: ABDOMINAL AORTOGRAM W/LOWER EXTREMITY;  Surgeon: Cage Deatrice DELENA, MD;  Location: [facility];  Service: Cardiovascular;  Laterality: N/A;   AMPUTATION Right 03/23/2016   Procedure: 1st and 2nd Ray Amputation Right Foot;  Surgeon: Jerona LULLA Sage,  MD;  Location: [facility] OR;  Service: Orthopedics;  Laterality: Right;   AMPUTATION Right 06/21/2016   Procedure: RIGHT TRANSMETATARSAL AMPUTATION;  Surgeon: Jerona LULLA Sage, MD;  Location: [facility] OR;  Service: Orthopedics;  Laterality: Right;   CARDIAC CATHETERIZATION N/A 03/13/2016   Procedure: Right/Left Heart Cath and Coronary Angiography;  Surgeon: Ozell Fell, MD;  [facility] INVASIVE CV LAB;  Service: Cardiovascular;  Laterality: N/A;   CARDIAC CATHETERIZATION N/A 03/13/2016   Procedure: IABP Insertion;  Surgeon: Ozell Fell, MD;  Location: [facility] INVASIVE CV LAB;  Service: Cardiovascular;  Laterality: N/A;   CATARACT EXTRACTION     CATARACT EXTRACTION, BILATERAL     CERVICAL FUSION  1982, 1992   has had 3  neck surgeries from breaking his neck   CIRCUMCISION N/A 11/07/2021   Procedure: CIRCUMCISION ADULT;  Surgeon: Lovie Arlyss CROME, MD;  Location: [facility] ORS;  Service: Urology;  Laterality: N/A;   CORONARY ARTERY BYPASS GRAFT N/A 03/13/2016   Procedure: CORONARY ARTERY BYPASS GRAFTING (CABG) x 1 (SVG to OM) with EVH from LEFT GREATER SAPHENOUS VEIN;  Surgeon: Maude Fleeta Ochoa, MD;  Location: [facility] OR;  Service: Open Heart Surgery;  Laterality: N/A;   EYE SURGERY     LOWER EXTREMITY ANGIOGRAM  05/02/2016   Procedure: Lower Extremity Angiogram;  Surgeon: Deatrice DELENA Cage, MD;  Location: [facility];  Service: Cardiovascular;;  Limited left femoral runoff right femoral runoff   PERIPHERAL VASCULAR BALLOON ANGIOPLASTY  07/17/2023   Procedure: PERIPHERAL VASCULAR BALLOON ANGIOPLASTY;  Surgeon: Cage Deatrice DELENA, MD;  Location: [facility];  Service: Cardiovascular;;   PERIPHERAL VASCULAR CATHETERIZATION N/A 05/02/2016   Procedure: Abdominal Aortogram;  Surgeon: Deatrice DELENA Cage, MD;  Location: [facility];  Service: Cardiovascular;  Laterality: N/A;   PERIPHERAL VASCULAR CATHETERIZATION Right 05/02/2016   Procedure: Peripheral Vascular Balloon Angioplasty;  Surgeon: Deatrice DELENA Cage, MD;   Location: [facility];  Service: Cardiovascular;  Laterality: Right;  SFA   TEE WITHOUT CARDIOVERSION N/A 03/13/2016   Procedure: TRANSESOPHAGEAL ECHOCARDIOGRAM (TEE);  Surgeon: Maude Fleeta Ochoa, MD;  Location: [facility] OR;  Service: Open Heart Surgery;  Laterality: N/A;   VSD REPAIR N/A 03/13/2016   Procedure: VENTRICULAR SEPTAL DEFECT (VSD) REPAIR;  Surgeon: Maude Fleeta Ochoa, MD;  Location: [facility] OR;  Service: Open Heart Surgery;  Laterality: N/A;   FAMILY HISTORY Family History  Problem Relation Age of Onset   Diabetes Maternal Grandmother    Diabetes Mother    Aneurysm Mother    Peripheral Artery Disease Mother    Coronary artery disease Mother    Peptic Ulcer Father    Retinoblastoma Daughter    Colon cancer Neg Hx    Rectal cancer Neg Hx    SOCIAL HISTORY Social History   Tobacco Use   Smoking status: Former    Current packs/day: 0.00    Types: Cigarettes    Quit date: 11/20/2015    Years since quitting: 8.2   Smokeless tobacco: Never   Tobacco comments:    quit 2018  Vaping Use   Vaping status: Never Used  Substance Use Topics   Alcohol use: No   Drug use: No       OPHTHALMIC EXAM: Base Eye Exam     Visual Acuity (Snellen - Linear)       Right Left   Dist [place] 20/30 -2 20/20   Dist ph [place] 20/25          Tonometry (Tonopen, 8:05 AM)       Right Left   Pressure 17 15         Pupils       Dark Light Shape React APD   Right 2 1 Round Brisk None   Left 2 1 Round Brisk None         Visual Fields       Left Right    Full Full         Extraocular Movement       Right Left    Full, Ortho Full, Ortho         Neuro/Psych     Oriented x3: Yes   Mood/Affect: Normal  Dilation     Both eyes: 1.0% Mydriacyl, 2.5% Phenylephrine  @ 8:03 AM           Slit Lamp and Fundus Exam     Slit Lamp Exam       Right Left   Lids/Lashes Dermatochalasis - upper lid, mild MGD Dermatochalasis - upper lid, mild MGD   Conjunctiva/Sclera  White and quiet White and quiet   Cornea trace PEE, well healed cataract wound, mild tear film debris 1+ fine PEE, mild tear film debris, well healed cataract wound   Anterior Chamber Deep and quiet Deep and quiet   Iris Round and dilated, No NVI Round and dilated, No NVI   Lens PC IOL in good position PC IOL in good position   Anterior Vitreous Vitreous syneresis, vitreous condensations Vitreous syneresis, Posterior vitreous detachment, vitreous condensations         Fundus Exam       Right Left   Disc trace Pallor, Sharp rim mild Pallor, Sharp rim, mild tilt   C/D Ratio 0.3 0.3   Macula Flat, good foveal reflex, scatted Microaneurysms, cystic changes inferior fovea and mac -- improved, good focal laser changes superiorly, good focal laser targets inferior macula Flat, good foveal reflex, scattered MA -- stably improved, mild interval improvement in cystic changes temporal and IT mac, good focal laser changes   Vessels attenuated, mild tortuosity, no NV attenuated, Tortuous   Periphery Attached, +MA greatest posteriorly Attached, scattered MA greatest posteriorly           IMAGING AND PROCEDURES  Imaging and Procedures for 03/03/2024  OCT, Retina - OU - Both Eyes       Right Eye Quality was good. Central Foveal Thickness: 293. Progression has improved. Findings include no SRF, abnormal foveal contour, intraretinal hyper-reflective material, intraretinal fluid, vitreomacular adhesion (Mild interval improvement in IRF/cystic changes inferior fovea and mac ).   Left Eye Quality was good. Central Foveal Thickness: 260. Progression has improved. Findings include normal foveal contour, no SRF, intraretinal hyper-reflective material, intraretinal fluid (Mild interval improvement in cystic changes IN and temporal macula).   Notes *Images captured and stored on drive  Diagnosis / Impression:  +DME OU OD: mild interval improvement in IRF/cystic changes inferior fovea and mac  OS: Mild  interval improvement in cystic changes IN and temporal macula  Clinical management:  See below  Abbreviations: NFP - Normal foveal profile. CME - cystoid macular edema. PED - pigment epithelial detachment. IRF - intraretinal fluid. SRF - subretinal fluid. EZ - ellipsoid zone. ERM - epiretinal membrane. ORA - outer retinal atrophy. ORT - outer retinal tubulation. SRHM - subretinal hyper-reflective material. IRHM - intraretinal hyper-reflective material      Intravitreal Injection, Pharmacologic Agent - OD - Right Eye       Time Out 03/03/2024. 8:56 AM. Confirmed correct patient, procedure, site, and patient consented.   Anesthesia Topical anesthesia was used. Anesthetic medications included Lidocaine  2%, Proparacaine 0.5%.   Procedure Preparation included 5% betadine  to ocular surface, eyelid speculum. A (32g) needle was used.   Injection: 1.25 mg Bevacizumab  1.25mg /0.48ml   Route: Intravitreal, Site: Right Eye   NDC: C2662926, Lot: [id number], Expiration date: 05/28/2024   Post-op Post injection exam found visual acuity of at least counting fingers. The patient tolerated the procedure well. There were no complications. The patient received written and verbal post procedure care education. Post injection medications were not given.      Intravitreal Injection, Pharmacologic Agent - OS - Left Eye  Time Out 03/03/2024. 8:57 AM. Confirmed correct patient, procedure, site, and patient consented.   Anesthesia Topical anesthesia was used. Anesthetic medications included Lidocaine  2%, Proparacaine 0.5%.   Procedure Preparation included 5% betadine  to ocular surface, eyelid speculum. A supplied (32g) needle was used.   Injection: 1.25 mg Bevacizumab  1.25mg /0.38ml   Route: Intravitreal, Site: Left Eye   NDC: 49757-939-98, Lot: [id number], Expiration date: 03/22/2024   Post-op Post injection exam found visual acuity of at least counting fingers. The patient tolerated the procedure  well. There were no complications. The patient received written and verbal post procedure care education. Post injection medications were not given.             ASSESSMENT/PLAN:    ICD-10-CM   1. Moderate nonproliferative diabetic retinopathy of both eyes with macular edema associated with type 2 diabetes mellitus ([name])  E11.3313 OCT, Retina - OU - Both Eyes    Intravitreal Injection, Pharmacologic Agent - OD - Right Eye    Intravitreal Injection, Pharmacologic Agent - OS - Left Eye    Bevacizumab  (AVASTIN ) SOLN 1.25 mg    Bevacizumab  (AVASTIN ) SOLN 1.25 mg    2. Current use of insulin  ([name])  Z79.4     3. Long-term (current) use of injectable non-insulin  antidiabetic drugs  Z79.85     4. Essential hypertension  I10     5. Hypertensive retinopathy of both eyes  H35.033     6. Pseudophakia, both eyes  Z96.1      1-3. Moderate non-proliferative diabetic retinopathy, both eyes  - last A1c was 6.5 on 02.17.25  - s/p IVA OD #1 (11.14.22), #2 (12.12.22), #3 (01.09.23), #4 (06.23.25), #5 (08.04.25) - s/p IVA OS #1 (10.17.22), #2 (11.14.22), #3 (12.12.22), #4 (01.09.23), #5 (03.15.23), #6 (04.12.23), #7 (08.04.25) ============================ - s/p IVE OD #1 (03.15.23), #2 (04.12.23), #3 (05.10.23), #4 (06.07.23), #5 (07.05.23), #6 (08.02.23), #7 (08.31.23), #8 (09.28.23), #9 (10.30.23), #10 (11.28.23), #11 (12.28.23), #12 (01.29.24), #13 (02.20.24), #14 (03.25.24), #15 (04.22.24), #16 (05.20.24), #17 (06.17.24), #18 (07.22.24), #19 (08.26.24), #20 (09.30.24), #21 (11.04.24), #22 (12.16.24) - s/p IVE OS #1 (05.10.23), #2 (06.07.23), #3 (07.05.23), #4 (08.02.23), #5 (08.31.23), #6 (09.28.23), #7 (10.30.23), #8 (11.28.23), #9 (12.28.23), #10 (01.29.24), #11 (02.20..24), #12 (03.25.24), #13 (04.22.24), #14 (05.20.24), #15 (06.17.24), #16 (07.22.24), #17 (09.30.24), #18 (11.04.24), #19 (12.16.24) ============================== - s/p focal laser OS (06.25.24) - s/p focal laser OD (01.13.25) -  exam shows scattered MA, DBH OU - FA (10.17.22) shows late leaking MA OU, no NV OU - OCT shows OD: mild interval improvement in IRF/cystic changes inferior fovea and mac; OS: Mild interval improvement in cystic changes IN and temporal macula at 4 wks - BCVA OD 20/25 - stable; OS stable at 20/20 - recommend IVA OD #6 and OS #8 today, 09.02.25 with follow up in 4 weeks ([employer] funding unavailable) - pt wishes to proceed with injections - RBA of procedure discussed, questions answered - IVA informed consent obtained and signed, 06.23.25 - see procedure note - f/u 4 weeks, sooner prn -- DFE, OCT, FA transit OD, possible injection(s)  4,5. Hypertensive retinopathy OU - discussed importance of tight BP control  - monitor  6. Pseudophakia OU  - s/p CE/IOL OU  - IOL in good position, doing well  - monitor  Ophthalmic Meds Ordered this visit:  Meds ordered this encounter  Medications   Bevacizumab  (AVASTIN ) SOLN 1.25 mg   Bevacizumab  (AVASTIN ) SOLN 1.25 mg     Return in about 4 weeks (around 03/31/2024) for  NPDR OU, DFE, OCT, Possible Injxn.  There are no Patient Instructions on file for this visit.  Explained the diagnoses, plan, and follow up with the patient and they expressed understanding.  Patient expressed understanding of the importance of proper follow up care.   This document serves as a record of services personally performed by Redell JUDITHANN Hans, MD, PhD. It was created on their behalf by Wanda GEANNIE Keens, COT an ophthalmic technician. The creation of this record is the provider's dictation and/or activities during the visit.    Electronically signed by:  Wanda GEANNIE Keens, COT  03/05/24 2:12 PM  This document serves as a record of services personally performed by Redell JUDITHANN Hans, MD, PhD. It was created on their behalf by Almetta Pesa, an ophthalmic technician. The creation of this record is the provider's dictation and/or activities during the visit.     Electronically signed by: Almetta Pesa, OA, 03/05/24  2:12 PM  Redell JUDITHANN Hans, M.D., Ph.D. Diseases & Surgery of the Retina and Vitreous [facility]  I have reviewed the above documentation for accuracy and completeness, and I agree with the above. Redell JUDITHANN Hans, M.D., Ph.D. 03/05/24 2:12 PM   Abbreviations: M myopia (nearsighted); A astigmatism; H hyperopia (farsighted); P presbyopia; Mrx spectacle prescription;  CTL contact lenses; OD right eye; OS left eye; OU both eyes  XT exotropia; ET esotropia; PEK punctate epithelial keratitis; PEE punctate epithelial erosions; DES dry eye syndrome; MGD meibomian gland dysfunction; ATs artificial tears; PFAT's preservative free artificial tears; NSC nuclear sclerotic cataract; PSC posterior subcapsular cataract; ERM epi-retinal membrane; PVD posterior vitreous detachment; RD retinal detachment; DM diabetes mellitus; DR diabetic retinopathy; NPDR non-proliferative diabetic retinopathy; PDR proliferative diabetic retinopathy; CSME clinically significant macular edema; DME diabetic macular edema; dbh dot blot hemorrhages; CWS cotton wool spot; POAG primary open angle glaucoma; C/D cup-to-disc ratio; HVF humphrey visual field; GVF goldmann visual field; OCT optical coherence tomography; IOP intraocular pressure; BRVO Branch retinal vein occlusion; CRVO central retinal vein occlusion; CRAO central retinal artery occlusion; BRAO branch retinal artery occlusion; RT retinal tear; SB scleral buckle; PPV pars plana vitrectomy; VH Vitreous hemorrhage; PRP panretinal laser photocoagulation; IVK intravitreal kenalog; VMT vitreomacular traction; MH Macular hole;  NVD neovascularization of the disc; NVE neovascularization elsewhere; AREDS age related eye disease study; ARMD age related macular degeneration; POAG primary open angle glaucoma; EBMD epithelial/anterior basement membrane dystrophy; ACIOL anterior chamber intraocular lens; IOL  intraocular lens; PCIOL posterior chamber intraocular lens; Phaco/IOL phacoemulsification with intraocular lens placement; PRK photorefractive keratectomy; LASIK laser assisted in situ keratomileusis; HTN hypertension; DM diabetes mellitus; COPD chronic obstructive pulmonary disease

## 2024-03-03 ENCOUNTER — Ambulatory Visit (INDEPENDENT_AMBULATORY_CARE_PROVIDER_SITE_OTHER): Admitting: Ophthalmology

## 2024-03-03 ENCOUNTER — Encounter (INDEPENDENT_AMBULATORY_CARE_PROVIDER_SITE_OTHER): Payer: Self-pay | Admitting: Ophthalmology

## 2024-03-03 DIAGNOSIS — I10 Essential (primary) hypertension: Secondary | ICD-10-CM | POA: Diagnosis not present

## 2024-03-03 DIAGNOSIS — Z7985 Long-term (current) use of injectable non-insulin antidiabetic drugs: Secondary | ICD-10-CM

## 2024-03-03 DIAGNOSIS — Z794 Long term (current) use of insulin: Secondary | ICD-10-CM

## 2024-03-03 DIAGNOSIS — Z961 Presence of intraocular lens: Secondary | ICD-10-CM | POA: Diagnosis not present

## 2024-03-03 DIAGNOSIS — H35033 Hypertensive retinopathy, bilateral: Secondary | ICD-10-CM | POA: Diagnosis not present

## 2024-03-03 DIAGNOSIS — E113313 Type 2 diabetes mellitus with moderate nonproliferative diabetic retinopathy with macular edema, bilateral: Secondary | ICD-10-CM

## 2024-03-03 MED ORDER — BEVACIZUMAB CHEMO INJECTION 1.25MG/0.05ML SYRINGE FOR KALEIDOSCOPE
1.2500 mg | INTRAVITREAL | Status: AC | PRN
  Administered 2024-03-03: 1.25 mg via INTRAVITREAL

## 2024-03-12 ENCOUNTER — Encounter: Payer: Self-pay | Admitting: Cardiovascular Disease

## 2024-03-16 ENCOUNTER — Other Ambulatory Visit: Payer: Self-pay | Admitting: Internal Medicine

## 2024-03-16 ENCOUNTER — Other Ambulatory Visit (HOSPITAL_COMMUNITY): Payer: Self-pay

## 2024-03-16 DIAGNOSIS — I2510 Atherosclerotic heart disease of native coronary artery without angina pectoris: Secondary | ICD-10-CM

## 2024-03-16 DIAGNOSIS — E781 Pure hyperglyceridemia: Secondary | ICD-10-CM

## 2024-03-16 DIAGNOSIS — I739 Peripheral vascular disease, unspecified: Secondary | ICD-10-CM

## 2024-03-16 MED FILL — Glucose Blood Test Strip: 33 days supply | Qty: 100 | Fill #1 | Status: AC

## 2024-03-16 NOTE — Progress Notes (Signed)
 [facility] - Clinic Note  03/30/2024    CHIEF COMPLAINT Patient presents for Retina Follow Up  HISTORY OF PRESENT ILLNESS: William [name] is a 73 y.o. male who presents to the clinic today for:   HPI     Retina Follow Up   Patient presents with  Diabetic Retinopathy.  In both eyes.  This started 4 weeks ago.  Duration of 4 weeks.  Since onset it is stable.  I, the attending physician,  performed the HPI with the patient and updated documentation appropriately.        Comments   4 week retina follow up NPDR OU pt is reporting no vision changes noticed he denies any flashes or floaters pt last reading 135 pt is getting over stomach virus       Last edited by Valdemar Rogue, MD on 03/30/2024  9:01 AM.    Pt states VA is stable, no changes he's noticed. Pt had a steroid injection due to pneumonia -- still having an impact on BG  Referring physician: Joshua Debby CROME, MD [address] [place],  [place] [address]  HISTORICAL INFORMATION:  Selected notes from the MEDICAL RECORD NUMBER Referred by Dr. Lelon for eval of DME OU LEE:  Ocular Hx- PMH-    CURRENT MEDICATIONS: No current outpatient medications on file. (Ophthalmic Drugs)   No current facility-administered medications for this visit. (Ophthalmic Drugs)   Current Outpatient Medications (Other)  Medication Sig   acetaminophen  (TYLENOL ) 500 MG tablet Take 500 mg by mouth every 6 (six) hours as needed for moderate pain.   aspirin  EC 81 MG tablet Take 1 tablet (81 mg total) by mouth daily. Swallow whole.   Blood Glucose Monitoring Suppl DEVI Use to test blood sugar morning, at noon, and at bedtime.   carvedilol  (COREG ) 3.125 MG tablet Take 1 tablet (3.125 mg total) by mouth 2 (two) times daily with a meal.   Continuous Glucose Receiver (DEXCOM G7 RECEIVER) DEVI Use to monitor blood sugar.   Continuous Glucose Sensor (DEXCOM G7 SENSOR) MISC INJECT 1 SENSOR EVERY 10 DAYS TO MONITOR BLOOD SUGAR    dapagliflozin  propanediol (FARXIGA ) 10 MG TABS tablet Take 1 tablet (10 mg total) by mouth daily.   gabapentin  (NEURONTIN ) 300 MG capsule TAKE 1 CAPSULE(300 MG) BY MOUTH TWICE DAILY   glucose blood (ACCU-CHEK GUIDE TEST) test strip USE TO CHECK BLOOD SUGAR IN THE AM, AT NOON AND AT BEDTIME   icosapent  Ethyl (VASCEPA ) 1 g capsule Take 2 capsules (2 g total) by mouth 2 (two) times daily.   icosapent  Ethyl (VASCEPA ) 1 g capsule Take 2 capsules (2 g total) by mouth 2 (two) times daily.   insulin  glargine (LANTUS  SOLOSTAR) 100 UNIT/ML Solostar Pen ADMINISTER 40 UNITS UNDER THE SKIN AT BEDTIME   Insulin  Glargine Solostar (LANTUS ) 100 UNIT/ML Solostar Pen Inject 55 Units into the skin every evening.   Insulin  Pen Needle 31G X 8 MM MISC Use once daily as directed.   Insulin  Pen Needle 31G X 8 MM MISC Use to inject insulin  four times daily.   Lancets Misc. (ACCU-CHEK FASTCLIX LANCET) KIT Use in the morning, noon and at bedtime.   OZEMPIC , 0.25 OR 0.5 MG/DOSE, 2 MG/3ML SOPN Inject 0.25 mg into the skin once a week.   pantoprazole  (PROTONIX ) 40 MG tablet TAKE 1 TABLET(40 MG) BY MOUTH TWICE DAILY BEFORE A MEAL   pramipexole  (MIRAPEX ) 0.125 MG tablet Take 1 tablet (0.125 mg total) by mouth at bedtime.  rosuvastatin  (CRESTOR ) 40 MG tablet TAKE 1 TABLET BY MOUTH EVERY DAY   rosuvastatin  (CRESTOR ) 40 MG tablet Take 1 tablet (40 mg total) by mouth daily.   Semaglutide ,0.25 or 0.5MG /DOS, (OZEMPIC , 0.25 OR 0.5 MG/DOSE,) 2 MG/3ML SOPN 0.25 mg once a week for 28 days, THEN 0.5 mg once a week for 28 days.   torsemide  (DEMADEX ) 20 MG tablet Take 20 mg by mouth 2 (two) times a week.   torsemide  (DEMADEX ) 20 MG tablet Take 1 tablet (20 mg total) by mouth 3 (three) times a week.   warfarin (COUMADIN ) 5 MG tablet TAKE 1 TO 1 AND 1/2 TABLETS BY MOUTH DAILY AS DIRECTED BY COUMADIN  CLINIC   warfarin (COUMADIN ) 5 MG tablet Take 1-1.5 tablets (5-7.5 mg total) by mouth daily as directed by coumadin  clinic   clopidogrel  (PLAVIX )  75 MG tablet Take 1 tablet (75 mg total) by mouth daily.   Continuous Glucose Receiver (FREESTYLE LIBRE 2 READER) DEVI Use to check blood sugar daily.   Insulin  Glargine Solostar (LANTUS ) 100 UNIT/ML Solostar Pen Inject 60 Units into the skin daily.   No current facility-administered medications for this visit. (Other)   REVIEW OF SYSTEMS: ROS   Positive for: Gastrointestinal, Genitourinary, Endocrine, Eyes Negative for: Constitutional, Neurological, Skin, Musculoskeletal, HENT, Cardiovascular, Respiratory, Psychiatric, Allergic/Imm, Heme/Lymph Last edited by Resa Delon ORN, COT on 03/30/2024  8:04 AM.        ALLERGIES Allergies  Allergen Reactions   Morphine And Codeine  Shortness Of Breath and Other (See Comments)    UNSPECIFIED REACTION Pt said it was too much    Latex Rash   PAST MEDICAL HISTORY Past Medical History:  Diagnosis Date   AKI (acute kidney injury)    With STEMI in 2017   Chronic systolic CHF (congestive heart failure) ([name])    Depression    Diabetes mellitus without complication ([name])    Diabetic retinopathy ([name])    GERD (gastroesophageal reflux disease)    Hx of adenomatous colonic polyps 04/07/2018   Hyperlipidemia    Hypertension    Hypertensive retinopathy    Paroxysmal atrial fibrillation ([name])    Peripheral vascular disease    Pneumonia    Seizures ([name])    hx of as a child   STEMI (ST elevation myocardial infarction) ([name]) 2017   Past Surgical History:  Procedure Laterality Date   ABDOMINAL AORTOGRAM W/LOWER EXTREMITY N/A 09/19/2016   Procedure: Abdominal Aortogram w/Lower Extremity;  Surgeon: Deatrice DELENA Cage, MD;  Location: [facility];  Service: Cardiovascular;  Laterality: N/A;   ABDOMINAL AORTOGRAM W/LOWER EXTREMITY N/A 07/17/2023   Procedure: ABDOMINAL AORTOGRAM W/LOWER EXTREMITY;  Surgeon: Cage Deatrice DELENA, MD;  Location: [facility];  Service: Cardiovascular;  Laterality: N/A;   AMPUTATION Right 03/23/2016    Procedure: 1st and 2nd Ray Amputation Right Foot;  Surgeon: Jerona LULLA Sage, MD;  Location: [facility] OR;  Service: Orthopedics;  Laterality: Right;   AMPUTATION Right 06/21/2016   Procedure: RIGHT TRANSMETATARSAL AMPUTATION;  Surgeon: Jerona LULLA Sage, MD;  Location: [facility] OR;  Service: Orthopedics;  Laterality: Right;   CARDIAC CATHETERIZATION N/A 03/13/2016   Procedure: Right/Left Heart Cath and Coronary Angiography;  Surgeon: Ozell Fell, MD;  Location: [facility] INVASIVE CV LAB;  Service: Cardiovascular;  Laterality: N/A;   CARDIAC CATHETERIZATION N/A 03/13/2016   Procedure: IABP Insertion;  Surgeon: Ozell Fell, MD;  Location: [facility] INVASIVE CV LAB;  Service: Cardiovascular;  Laterality: N/A;   CATARACT EXTRACTION     CATARACT EXTRACTION, BILATERAL  CERVICAL FUSION  1982, 1992   has had 3 neck surgeries from breaking his neck   CIRCUMCISION N/A 11/07/2021   Procedure: CIRCUMCISION ADULT;  Surgeon: Lovie Arlyss CROME, MD;  Location: [facility] ORS;  Service: Urology;  Laterality: N/A;   CORONARY ARTERY BYPASS GRAFT N/A 03/13/2016   Procedure: CORONARY ARTERY BYPASS GRAFTING (CABG) x 1 (SVG to OM) with EVH from LEFT GREATER SAPHENOUS VEIN;  Surgeon: Maude Fleeta Ochoa, MD;  Location: [facility] OR;  Service: Open Heart Surgery;  Laterality: N/A;   EYE SURGERY     LOWER EXTREMITY ANGIOGRAM  05/02/2016   Procedure: Lower Extremity Angiogram;  Surgeon: Deatrice DELENA Cage, MD;  Location: [facility];  Service: Cardiovascular;;  Limited left femoral runoff right femoral runoff   PERIPHERAL VASCULAR BALLOON ANGIOPLASTY  07/17/2023   Procedure: PERIPHERAL VASCULAR BALLOON ANGIOPLASTY;  Surgeon: Cage Deatrice DELENA, MD;  Location: [facility];  Service: Cardiovascular;;   PERIPHERAL VASCULAR CATHETERIZATION N/A 05/02/2016   Procedure: Abdominal Aortogram;  Surgeon: Deatrice DELENA Cage, MD;  Location: [facility];  Service: Cardiovascular;  Laterality: N/A;   PERIPHERAL VASCULAR CATHETERIZATION Right 05/02/2016    Procedure: Peripheral Vascular Balloon Angioplasty;  Surgeon: Deatrice DELENA Cage, MD;  Location: [facility];  Service: Cardiovascular;  Laterality: Right;  SFA   TEE WITHOUT CARDIOVERSION N/A 03/13/2016   Procedure: TRANSESOPHAGEAL ECHOCARDIOGRAM (TEE);  Surgeon: Maude Fleeta Ochoa, MD;  Location: [facility] OR;  Service: Open Heart Surgery;  Laterality: N/A;   VSD REPAIR N/A 03/13/2016   Procedure: VENTRICULAR SEPTAL DEFECT (VSD) REPAIR;  Surgeon: Maude Fleeta Ochoa, MD;  Location: [facility] OR;  Service: Open Heart Surgery;  Laterality: N/A;   FAMILY HISTORY Family History  Problem Relation Age of Onset   Diabetes Maternal Grandmother    Diabetes Mother    Aneurysm Mother    Peripheral Artery Disease Mother    Coronary artery disease Mother    Peptic Ulcer Father    Retinoblastoma Daughter    Colon cancer Neg Hx    Rectal cancer Neg Hx    SOCIAL HISTORY Social History   Tobacco Use   Smoking status: Former    Current packs/day: 0.00    Types: Cigarettes    Quit date: 11/20/2015    Years since quitting: 8.3   Smokeless tobacco: Never   Tobacco comments:    quit 2018  Vaping Use   Vaping status: Never Used  Substance Use Topics   Alcohol use: No   Drug use: No       OPHTHALMIC EXAM: Base Eye Exam     Visual Acuity (Snellen - Linear)       Right Left   Dist [place] 20/30 20/20 -1   Dist ph [place] 20/25          Tonometry (Tonopen, 8:11 AM)       Right Left   Pressure 11 12         Pupils       Pupils Dark Light Shape React APD   Right PERRL 2 1 Round Brisk None   Left PERRL 2 1 Round Brisk None         Visual Fields       Left Right    Full Full         Extraocular Movement       Right Left    Full, Ortho Full, Ortho         Neuro/Psych     Oriented x3:  Yes   Mood/Affect: Normal         Dilation     Both eyes: 2.5% Phenylephrine  @ 8:11 AM           Slit Lamp and Fundus Exam     Slit Lamp Exam       Right Left   Lids/Lashes  Dermatochalasis - upper lid, mild MGD Dermatochalasis - upper lid, mild MGD   Conjunctiva/Sclera White and quiet White and quiet   Cornea trace PEE, well healed cataract wound, mild tear film debris 1+ fine PEE, mild tear film debris, well healed cataract wound   Anterior Chamber Deep and quiet Deep and quiet   Iris Round and dilated, No NVI Round and dilated, No NVI   Lens PC IOL in good position PC IOL in good position   Anterior Vitreous Vitreous syneresis, vitreous condensations Vitreous syneresis, Posterior vitreous detachment, vitreous condensations         Fundus Exam       Right Left   Disc trace Pallor, Sharp rim mild Pallor, Sharp rim, mild tilt   C/D Ratio 0.3 0.3   Macula Flat, good foveal reflex, scattered Microaneurysms, cystic changes inferior fovea and mac -- improved, good focal laser changes superiorly, good focal laser targets inferior macula Flat, good foveal reflex, scattered MA -- stably improved, persistent cystic changes temporal and IT mac, good focal laser changes   Vessels attenuated, mild tortuosity, no NV attenuated, Tortuous   Periphery Attached, +MA greatest posteriorly Attached, scattered MA greatest posteriorly           IMAGING AND PROCEDURES  Imaging and Procedures for 03/30/2024  OCT, Retina - OU - Both Eyes       Right Eye Quality was good. Central Foveal Thickness: 272. Progression has improved. Findings include no SRF, abnormal foveal contour, intraretinal hyper-reflective material, intraretinal fluid, vitreomacular adhesion (Mild interval improvement in IRF/cystic changes inferior fovea and mac ).   Left Eye Quality was good. Central Foveal Thickness: 258. Progression has been stable. Findings include normal foveal contour, no SRF, intraretinal hyper-reflective material, intraretinal fluid (Persistent cystic changes IN and temporal macula).   Notes *Images captured and stored on drive  Diagnosis / Impression:  +DME OU OD: mild interval  improvement in IRF/cystic changes inferior fovea and mac  OS: Persistent cystic changes IN and temporal macula  Clinical management:  See below  Abbreviations: NFP - Normal foveal profile. CME - cystoid macular edema. PED - pigment epithelial detachment. IRF - intraretinal fluid. SRF - subretinal fluid. EZ - ellipsoid zone. ERM - epiretinal membrane. ORA - outer retinal atrophy. ORT - outer retinal tubulation. SRHM - subretinal hyper-reflective material. IRHM - intraretinal hyper-reflective material      Intravitreal Injection, Pharmacologic Agent - OD - Right Eye       Time Out 03/30/2024. 8:39 AM. Confirmed correct patient, procedure, site, and patient consented.   Anesthesia Topical anesthesia was used. Anesthetic medications included Lidocaine  2%, Proparacaine 0.5%.   Procedure Preparation included 5% betadine  to ocular surface, eyelid speculum. A (32g) needle was used.   Injection: 1.25 mg Bevacizumab  1.25mg /0.10ml   Route: Intravitreal, Site: Right Eye   NDC: C2662926, Lot: [id number], Expiration date: 07/11/2024   Post-op Post injection exam found visual acuity of at least counting fingers. The patient tolerated the procedure well. There were no complications. The patient received written and verbal post procedure care education. Post injection medications were not given.      Intravitreal Injection, Pharmacologic Agent - OS -  Left Eye       Time Out 03/30/2024. 8:42 AM. Confirmed correct patient, procedure, site, and patient consented.   Anesthesia Topical anesthesia was used. Anesthetic medications included Lidocaine  2%, Proparacaine 0.5%.   Procedure Preparation included 5% betadine  to ocular surface, eyelid speculum. A (32g) needle was used.   Injection: 1.25 mg Bevacizumab  1.25mg /0.29ml   Route: Intravitreal, Site: Left Eye   NDC: H525437, Lot: [id number], Expiration date: 05/28/2024   Post-op Post injection exam found visual acuity of at least counting  fingers. The patient tolerated the procedure well. There were no complications. The patient received written and verbal post procedure care education. Post injection medications were not given.            ASSESSMENT/PLAN:    ICD-10-CM   1. Moderate nonproliferative diabetic retinopathy of both eyes with macular edema associated with type 2 diabetes mellitus ([name])  E11.3313 OCT, Retina - OU - Both Eyes    Intravitreal Injection, Pharmacologic Agent - OD - Right Eye    Intravitreal Injection, Pharmacologic Agent - OS - Left Eye    Bevacizumab  (AVASTIN ) SOLN 1.25 mg    Bevacizumab  (AVASTIN ) SOLN 1.25 mg    2. Current use of insulin  ([name])  Z79.4     3. Long-term (current) use of injectable non-insulin  antidiabetic drugs  Z79.85     4. Essential hypertension  I10     5. Hypertensive retinopathy of both eyes  H35.033     6. Pseudophakia, both eyes  Z96.1       1-3. Moderate non-proliferative diabetic retinopathy, both eyes  - last A1c was 6.5 on 02.17.25  - s/p IVA OD #1 (11.14.22), #2 (12.12.22), #3 (01.09.23), #4 (06.23.25), #5 (08.04.25), #6(09.02.25) - s/p IVA OS #1 (10.17.22), #2 (11.14.22), #3 (12.12.22), #4 (01.09.23), #5 (03.15.23), #6 (04.12.23), #7 (08.04.25), #8 (09.02.25) ============================ - s/p IVE OD #1 (03.15.23), #2 (04.12.23), #3 (05.10.23), #4 (06.07.23), #5 (07.05.23), #6 (08.02.23), #7 (08.31.23), #8 (09.28.23), #9 (10.30.23), #10 (11.28.23), #11 (12.28.23), #12 (01.29.24), #13 (02.20.24), #14 (03.25.24), #15 (04.22.24), #16 (05.20.24), #17 (06.17.24), #18 (07.22.24), #19 (08.26.24), #20 (09.30.24), #21 (11.04.24), #22 (12.16.24) - s/p IVE OS #1 (05.10.23), #2 (06.07.23), #3 (07.05.23), #4 (08.02.23), #5 (08.31.23), #6 (09.28.23), #7 (10.30.23), #8 (11.28.23), #9 (12.28.23), #10 (01.29.24), #11 (02.20..24), #12 (03.25.24), #13 (04.22.24), #14 (05.20.24), #15 (06.17.24), #16 (07.22.24), #17 (09.30.24), #18 (11.04.24), #19  (12.16.24) ============================== - s/p focal laser OS (06.25.24) - s/p focal laser OD (01.13.25) - exam shows scattered MA, DBH OU - FA (10.17.22) shows late leaking MA OU, no NV OU - OCT shows OD: mild interval improvement in IRF/cystic changes inferior fovea and mac; OS: Persistent cystic changes IN and temporal macula at 4 wks - BCVA OD 20/25 - stable (PH from 20/30); OS stable at 20/20 - recommend IVA OD #7 and OS #9 today, 09.29.25 with follow up in 4-5 weeks ([employer] funding unavailable) - pt wishes to proceed with injections - RBA of procedure discussed, questions answered - IVA informed consent obtained and signed, 06.23.25 - see procedure note - f/u 4-5 weeks, sooner prn -- DFE, OCT, FA transit OD, possible injection(s)  4,5. Hypertensive retinopathy OU - discussed importance of tight BP control  - monitor  6. Pseudophakia OU  - s/p CE/IOL OU  - IOL in good position, doing well  - monitor  Ophthalmic Meds Ordered this visit:  Meds ordered this encounter  Medications   Bevacizumab  (AVASTIN ) SOLN 1.25 mg   Bevacizumab  (AVASTIN ) SOLN 1.25 mg  Return for 4-5 wks NPDR OU, DFE, OCT, Possible Injxn.  There are no Patient Instructions on file for this visit.  Explained the diagnoses, plan, and follow up with the patient and they expressed understanding.  Patient expressed understanding of the importance of proper follow up care.   This document serves as a record of services personally performed by Redell JUDITHANN Hans, MD, PhD. It was created on their behalf by Avelina Pereyra, COA an ophthalmic technician. The creation of this record is the provider's dictation and/or activities during the visit.   Electronically signed by: Avelina GORMAN Pereyra, COT  03/30/24  9:15 AM   This document serves as a record of services personally performed by Redell JUDITHANN Hans, MD, PhD. It was created on their behalf by Almetta Pesa, an ophthalmic technician. The creation of this record is  the provider's dictation and/or activities during the visit.    Electronically signed by: Almetta Pesa, OA, 03/30/24  9:15 AM   Redell JUDITHANN Hans, M.D., Ph.D. Diseases & Surgery of the Retina and Vitreous [facility]  I have reviewed the above documentation for accuracy and completeness, and I agree with the above. Redell JUDITHANN Hans, M.D., Ph.D. 03/30/24 9:16 AM   Abbreviations: M myopia (nearsighted); A astigmatism; H hyperopia (farsighted); P presbyopia; Mrx spectacle prescription;  CTL contact lenses; OD right eye; OS left eye; OU both eyes  XT exotropia; ET esotropia; PEK punctate epithelial keratitis; PEE punctate epithelial erosions; DES dry eye syndrome; MGD meibomian gland dysfunction; ATs artificial tears; PFAT's preservative free artificial tears; NSC nuclear sclerotic cataract; PSC posterior subcapsular cataract; ERM epi-retinal membrane; PVD posterior vitreous detachment; RD retinal detachment; DM diabetes mellitus; DR diabetic retinopathy; NPDR non-proliferative diabetic retinopathy; PDR proliferative diabetic retinopathy; CSME clinically significant macular edema; DME diabetic macular edema; dbh dot blot hemorrhages; CWS cotton wool spot; POAG primary open angle glaucoma; C/D cup-to-disc ratio; HVF humphrey visual field; GVF goldmann visual field; OCT optical coherence tomography; IOP intraocular pressure; BRVO Branch retinal vein occlusion; CRVO central retinal vein occlusion; CRAO central retinal artery occlusion; BRAO branch retinal artery occlusion; RT retinal tear; SB scleral buckle; PPV pars plana vitrectomy; VH Vitreous hemorrhage; PRP panretinal laser photocoagulation; IVK intravitreal kenalog; VMT vitreomacular traction; MH Macular hole;  NVD neovascularization of the disc; NVE neovascularization elsewhere; AREDS age related eye disease study; ARMD age related macular degeneration; POAG primary open angle glaucoma; EBMD epithelial/anterior basement membrane  dystrophy; ACIOL anterior chamber intraocular lens; IOL intraocular lens; PCIOL posterior chamber intraocular lens; Phaco/IOL phacoemulsification with intraocular lens placement; PRK photorefractive keratectomy; LASIK laser assisted in situ keratomileusis; HTN hypertension; DM diabetes mellitus; COPD chronic obstructive pulmonary disease

## 2024-03-17 ENCOUNTER — Other Ambulatory Visit: Payer: Self-pay

## 2024-03-17 ENCOUNTER — Other Ambulatory Visit (HOSPITAL_COMMUNITY): Payer: Self-pay

## 2024-03-17 MED ORDER — ICOSAPENT ETHYL 1 G PO CAPS
2.0000 g | ORAL_CAPSULE | Freq: Two times a day (BID) | ORAL | 1 refills | Status: AC
  Filled 2024-03-17: qty 360, 90d supply, fill #0
  Filled 2024-06-09: qty 360, 90d supply, fill #1

## 2024-03-18 ENCOUNTER — Other Ambulatory Visit (HOSPITAL_COMMUNITY): Payer: Self-pay

## 2024-03-23 ENCOUNTER — Other Ambulatory Visit (HOSPITAL_COMMUNITY): Payer: Self-pay

## 2024-03-30 ENCOUNTER — Ambulatory Visit: Attending: Cardiovascular Disease | Admitting: Pharmacist

## 2024-03-30 ENCOUNTER — Encounter (INDEPENDENT_AMBULATORY_CARE_PROVIDER_SITE_OTHER): Payer: Self-pay | Admitting: Ophthalmology

## 2024-03-30 ENCOUNTER — Ambulatory Visit (INDEPENDENT_AMBULATORY_CARE_PROVIDER_SITE_OTHER): Admitting: Ophthalmology

## 2024-03-30 ENCOUNTER — Other Ambulatory Visit: Payer: Self-pay

## 2024-03-30 DIAGNOSIS — Z7901 Long term (current) use of anticoagulants: Secondary | ICD-10-CM | POA: Insufficient documentation

## 2024-03-30 DIAGNOSIS — Z961 Presence of intraocular lens: Secondary | ICD-10-CM

## 2024-03-30 DIAGNOSIS — I10 Essential (primary) hypertension: Secondary | ICD-10-CM | POA: Diagnosis not present

## 2024-03-30 DIAGNOSIS — E113313 Type 2 diabetes mellitus with moderate nonproliferative diabetic retinopathy with macular edema, bilateral: Secondary | ICD-10-CM

## 2024-03-30 DIAGNOSIS — Z794 Long term (current) use of insulin: Secondary | ICD-10-CM | POA: Diagnosis not present

## 2024-03-30 DIAGNOSIS — H35033 Hypertensive retinopathy, bilateral: Secondary | ICD-10-CM

## 2024-03-30 DIAGNOSIS — Z7985 Long-term (current) use of injectable non-insulin antidiabetic drugs: Secondary | ICD-10-CM | POA: Diagnosis not present

## 2024-03-30 DIAGNOSIS — I480 Paroxysmal atrial fibrillation: Secondary | ICD-10-CM

## 2024-03-30 LAB — POCT INR: INR: 1.6 — AB (ref 2.0–3.0)

## 2024-03-30 MED ORDER — WARFARIN SODIUM 5 MG PO TABS
ORAL_TABLET | ORAL | 1 refills | Status: AC
  Filled 2024-03-30: qty 120, 80d supply, fill #0
  Filled 2024-07-01: qty 120, 80d supply, fill #1

## 2024-03-30 MED ORDER — BEVACIZUMAB CHEMO INJECTION 1.25MG/0.05ML SYRINGE FOR KALEIDOSCOPE
1.2500 mg | INTRAVITREAL | Status: AC | PRN
  Administered 2024-03-30: 1.25 mg via INTRAVITREAL

## 2024-03-30 NOTE — Progress Notes (Signed)
 0                           Description   INR 1.6. Take 2 tablets today and then continue taking 1.5 tablets daily except for 1 tablet every Mondays and Fridays.  Eat greens tonight. Recheck INR in 5 weeks.  Stay consistent with greens Anticoagulation Clinic [phone]

## 2024-04-01 ENCOUNTER — Ambulatory Visit: Payer: Self-pay

## 2024-04-01 NOTE — Telephone Encounter (Signed)
 FYI Only or Action Required?: FYI only for provider.  Patient was last seen in primary care on 01/14/2024 by Joshua Debby CROME, MD.  Called Nurse Triage reporting Diarrhea.  Symptoms began a week ago.  Interventions attempted: Dietary changes and Other: increased cheese intake, held ozempic .  Symptoms are: unchanged.  Triage Disposition: See PCP When Office is Open (Within 3 Days)  Patient/caregiver understands and will follow disposition?: Yes   Copied from CRM #[id number]. Topic: Clinical - Red Word Triage >> Apr 01, 2024  1:23 PM William [name] wrote: Red Word that prompted transfer to Nurse Triage: uncontrolled diarrhea Reason for Disposition  [1] MILD diarrhea (e.g., 1-3 or more stools than normal in past 24 hours) AND [2] present >  7 days  (Exception: Chronic diarrhea that is not worse.)  Answer Assessment - Initial Assessment Questions Additional info: 1) Patient takes Ozempic  and not sure if this caused nausea and diarrhea, held dose on Thursday but diarrhea persists. Eating cheese to help bind stool but not helpful. Has not tried any otc medications.   1. DIARRHEA SEVERITY: How bad is the diarrhea? How many more stools have you had in the past 24 hours than normal?      few times per days 2. ONSET: When did the diarrhea begin?      one week ago 3. STOOL DESCRIPTION:  How loose or watery is the diarrhea? What is the stool color? Is there any blood or mucous in the stool?     loose 4. VOMITING: Are you also vomiting? If Yes, ask: How many times in the past 24 hours?      No. Was nauseous last week.  5. ABDOMEN PAIN: Are you having any abdomen pain? If Yes, ask: What does it feel like? (e.g., crampy, dull, intermittent, constant)      Denies 6. ABDOMEN PAIN SEVERITY: If present, ask: How bad is the pain?  (e.g., Scale 1-10; mild, moderate, or severe)     0/10 7. ORAL INTAKE: If vomiting, Have you been able to drink liquids? How much liquids have you had in the  past 24 hours?     Nausea  8. HYDRATION: Any signs of dehydration? (e.g., dry mouth [not just dry lips], too weak to stand, dizziness, new weight loss) When did you last urinate?     hydrated 9. EXPOSURE: Have you traveled to a foreign country recently? Have you been exposed to anyone with diarrhea? Could you have eaten any food that was spoiled?     Denies  10. ANTIBIOTIC USE: Are you taking antibiotics now or have you taken antibiotics in the past 2 months?       no 11. OTHER SYMPTOMS: Do you have any other symptoms? (e.g., fever, blood in stool)       Overall feels well, does not feel ill.  Protocols used: [facility]

## 2024-04-02 ENCOUNTER — Telehealth: Payer: Self-pay

## 2024-04-02 ENCOUNTER — Encounter: Payer: Self-pay | Admitting: Family Medicine

## 2024-04-02 ENCOUNTER — Encounter: Payer: Self-pay | Admitting: Internal Medicine

## 2024-04-02 ENCOUNTER — Ambulatory Visit: Admitting: Family Medicine

## 2024-04-02 VITALS — BP 112/78 | HR 72 | Temp 98.2°F | Ht 65.0 in | Wt 171.6 lb

## 2024-04-02 NOTE — Telephone Encounter (Signed)
 Patient presented to the clinical today with his daughter and granddaughter for an acute visit. Patient's daughter went to the check-in desk to and when questioned by staff if patient was here with her, daughter said  I don't have time for stupid questions. I need to check in my dad. She was asked again and informed patient must be onsite to check them in. She got louder, stating the same I don't have time for stupid questions, of course he is here. Eventually, she provided the information needed to check in the patient and started pronouncing to the lobby about the stupidity of the person at check-in.  The patient was roomed to by a CMA. Provider and leadership were made aware of the aggressive behavior. The clinical supervisor went to the patient and daughter in exam room to express concern for the communication at check-in. Daughter immediately got defensive restated what she stated to front desk and that this was stupid and she stood and demanded to see the provider. I, sitting on the provider stool, informed them that we will see the patient, but this behavior is not tolerated toward our staff. Her body language and tone were not helpful. She said I am done talking to you and I want the Dr. Now. Patient said we can just leave.  The clinical supervisor walked out room and informed staff that we are not tolerating this behavior and updated the provider. Provider indicated she was willing to see patient, but not with her in the room.  When I returned to let them know providers' expectations, they were leaving and yelling at another provider, [name], that they would only see him from now on.  The daughter and father were in lobby, I informed them that provider, [name], will see dad but not with her in the room. Daughter yelled, he can't read or write so I have to be with him. We are leaving and going to sue you. She was also yelling at the front desk staff that checked her in you  snitch, as the daughter was rescheduling appointment with another front desk member, and the previous front desk member was with another patient.    In conversation with [name], he explained this is not appropriate behavior to allow in our clinic toward our staff and being aggressive does not help the patient, as we are still willing to see him but not with her present. He agreed, dismissal paperwork was drafted, and he stated he would review for signature.   Once left our lobby, she continued to the downstairs office where she continued her rant to their front desk staff before leaving.

## 2024-04-02 NOTE — Telephone Encounter (Signed)
 FYI

## 2024-04-02 NOTE — Telephone Encounter (Signed)
 Since patient incident clinic has received 2 calls from others in the family members stating that they would blast us  on social media and they will call the news about how we treated them.   Leadership referred them to the office of patient experience as additional interactions would not benefit either party.

## 2024-04-02 NOTE — Patient Instructions (Signed)
 We are checking labs today, will be in contact with any results that require further attention  Continue gatorade and oatmeal.   Follow up in the ER for worsening symptoms, trouble breathing, high fever.

## 2024-04-05 NOTE — Progress Notes (Signed)
 Pt not seen

## 2024-04-09 ENCOUNTER — Other Ambulatory Visit (HOSPITAL_COMMUNITY): Payer: Self-pay

## 2024-04-09 ENCOUNTER — Other Ambulatory Visit: Payer: Self-pay | Admitting: Internal Medicine

## 2024-04-09 ENCOUNTER — Other Ambulatory Visit: Payer: Self-pay

## 2024-04-09 DIAGNOSIS — Z794 Long term (current) use of insulin: Secondary | ICD-10-CM

## 2024-04-09 MED ORDER — GABAPENTIN 300 MG PO CAPS
ORAL_CAPSULE | ORAL | 0 refills | Status: DC
  Filled 2024-04-09: qty 180, fill #0

## 2024-04-09 MED ORDER — GABAPENTIN 300 MG PO CAPS
300.0000 mg | ORAL_CAPSULE | Freq: Two times a day (BID) | ORAL | 0 refills | Status: DC
  Filled 2024-04-09: qty 180, 90d supply, fill #0

## 2024-04-13 NOTE — Progress Notes (Signed)
 [facility] - Clinic Note  04/27/2024    CHIEF COMPLAINT Patient presents for Retina Follow Up  HISTORY OF PRESENT ILLNESS: William [name] is a 73 y.o. male who presents to the clinic today for:   HPI     Retina Follow Up   Patient presents with  Diabetic Retinopathy.  In both eyes.  This started 3 years ago.  Severity is moderate.  Duration of 4 weeks.  Since onset it is stable.  I, the attending physician,  performed the HPI with the patient and updated documentation appropriately.        Comments   Patient states no changes in vision. Pt denies FOL/floaters/pain. Pt does not use ats. A1c=6.7, 12/06/23 BS=114 this morning.       Last edited by Valdemar Rogue, MD on 04/30/2024  8:34 PM.    Patient feels the vision is about the same.   Referring physician: Joshua Debby CROME, MD [address] [place],  [place] [address]  HISTORICAL INFORMATION:  Selected notes from the MEDICAL RECORD NUMBER Referred by Dr. Lelon for eval of DME OU LEE:  Ocular Hx- PMH-    CURRENT MEDICATIONS: No current outpatient medications on file. (Ophthalmic Drugs)   No current facility-administered medications for this visit. (Ophthalmic Drugs)   Current Outpatient Medications (Other)  Medication Sig   acetaminophen  (TYLENOL ) 500 MG tablet Take 500 mg by mouth every 6 (six) hours as needed for moderate pain.   aspirin  EC 81 MG tablet Take 1 tablet (81 mg total) by mouth daily. Swallow whole.   Blood Glucose Monitoring Suppl DEVI Use to test blood sugar morning, at noon, and at bedtime.   carvedilol  (COREG ) 3.125 MG tablet Take 1 tablet (3.125 mg total) by mouth 2 (two) times daily with a meal.   Continuous Glucose Receiver (DEXCOM G7 RECEIVER) DEVI Use to monitor blood sugar.   Continuous Glucose Sensor (DEXCOM G7 SENSOR) MISC INJECT 1 SENSOR EVERY 10 DAYS TO MONITOR BLOOD SUGAR   dapagliflozin  propanediol (FARXIGA ) 10 MG TABS tablet Take 1 tablet (10 mg total) by mouth  daily.   gabapentin  (NEURONTIN ) 300 MG capsule Take 1 capsule (300 mg total) by mouth 2 (two) times daily.   glucose blood (ACCU-CHEK GUIDE TEST) test strip USE TO CHECK BLOOD SUGAR IN THE AM, AT NOON AND AT BEDTIME   icosapent  Ethyl (VASCEPA ) 1 g capsule Take 2 capsules (2 g total) by mouth 2 (two) times daily.   insulin  glargine (LANTUS  SOLOSTAR) 100 UNIT/ML Solostar Pen ADMINISTER 40 UNITS UNDER THE SKIN AT BEDTIME   Insulin  Glargine Solostar (LANTUS ) 100 UNIT/ML Solostar Pen Inject 55 Units into the skin every evening.   Insulin  Pen Needle 31G X 8 MM MISC Use once daily as directed.   Insulin  Pen Needle 31G X 8 MM MISC Use to inject insulin  four times daily.   Lancets Misc. (ACCU-CHEK FASTCLIX LANCET) KIT Use in the morning, noon and at bedtime.   OZEMPIC , 0.25 OR 0.5 MG/DOSE, 2 MG/3ML SOPN Inject 0.25 mg into the skin once a week.   pantoprazole  (PROTONIX ) 40 MG tablet TAKE 1 TABLET(40 MG) BY MOUTH TWICE DAILY BEFORE A MEAL   pramipexole  (MIRAPEX ) 0.125 MG tablet Take 1 tablet (0.125 mg total) by mouth at bedtime.   rosuvastatin  (CRESTOR ) 40 MG tablet Take 1 tablet (40 mg total) by mouth daily.   torsemide  (DEMADEX ) 20 MG tablet Take 1 tablet (20 mg total) by mouth 3 (three) times a week.   warfarin (  COUMADIN ) 5 MG tablet TAKE 1 TO 1 AND 1/2 TABLETS BY MOUTH DAILY AS DIRECTED BY COUMADIN  CLINIC   No current facility-administered medications for this visit. (Other)   REVIEW OF SYSTEMS: ROS   Positive for: Gastrointestinal, Genitourinary, Endocrine, Eyes Negative for: Constitutional, Neurological, Skin, Musculoskeletal, HENT, Cardiovascular, Respiratory, Psychiatric, Allergic/Imm, Heme/Lymph Last edited by Elnor Avelina RAMAN, COT on 04/27/2024  7:56 AM.     ALLERGIES Allergies  Allergen Reactions   Morphine And Codeine  Shortness Of Breath and Other (See Comments)    UNSPECIFIED REACTION Pt said it was too much    Latex Rash   PAST MEDICAL HISTORY Past Medical History:   Diagnosis Date   AKI (acute kidney injury)    With STEMI in 2017   Chronic systolic CHF (congestive heart failure) ([name])    Depression    Diabetes mellitus without complication ([name])    Diabetic retinopathy ([name])    GERD (gastroesophageal reflux disease)    Hx of adenomatous colonic polyps 04/07/2018   Hyperlipidemia    Hypertension    Hypertensive retinopathy    Paroxysmal atrial fibrillation ([name])    Peripheral vascular disease    Pneumonia    Seizures ([name])    hx of as a child   STEMI (ST elevation myocardial infarction) ([name]) 2017   Past Surgical History:  Procedure Laterality Date   ABDOMINAL AORTOGRAM W/LOWER EXTREMITY N/A 09/19/2016   Procedure: Abdominal Aortogram w/Lower Extremity;  Surgeon: Deatrice DELENA Cage, MD;  Location: [facility];  Service: Cardiovascular;  Laterality: N/A;   ABDOMINAL AORTOGRAM W/LOWER EXTREMITY N/A 07/17/2023   Procedure: ABDOMINAL AORTOGRAM W/LOWER EXTREMITY;  Surgeon: Cage Deatrice DELENA, MD;  Location: [facility];  Service: Cardiovascular;  Laterality: N/A;   AMPUTATION Right 03/23/2016   Procedure: 1st and 2nd Ray Amputation Right Foot;  Surgeon: Jerona LULLA Sage, MD;  Location: [facility] OR;  Service: Orthopedics;  Laterality: Right;   AMPUTATION Right 06/21/2016   Procedure: RIGHT TRANSMETATARSAL AMPUTATION;  Surgeon: Jerona LULLA Sage, MD;  Location: [facility] OR;  Service: Orthopedics;  Laterality: Right;   CARDIAC CATHETERIZATION N/A 03/13/2016   Procedure: Right/Left Heart Cath and Coronary Angiography;  Surgeon: Ozell Fell, MD;  Location: [facility] INVASIVE CV LAB;  Service: Cardiovascular;  Laterality: N/A;   CARDIAC CATHETERIZATION N/A 03/13/2016   Procedure: IABP Insertion;  Surgeon: Ozell Fell, MD;  Location: [facility] INVASIVE CV LAB;  Service: Cardiovascular;  Laterality: N/A;   CATARACT EXTRACTION     CATARACT EXTRACTION, BILATERAL     CERVICAL FUSION  1982, 1992   has had 3 neck surgeries from breaking his neck   CIRCUMCISION N/A 11/07/2021    Procedure: CIRCUMCISION ADULT;  Surgeon: Lovie Arlyss CROME, MD;  Location: [facility] ORS;  Service: Urology;  Laterality: N/A;   CORONARY ARTERY BYPASS GRAFT N/A 03/13/2016   Procedure: CORONARY ARTERY BYPASS GRAFTING (CABG) x 1 (SVG to OM) with EVH from LEFT GREATER SAPHENOUS VEIN;  Surgeon: Maude Fleeta Ochoa, MD;  Location: [facility] OR;  Service: Open Heart Surgery;  Laterality: N/A;   EYE SURGERY     LOWER EXTREMITY ANGIOGRAM  05/02/2016   Procedure: Lower Extremity Angiogram;  Surgeon: Deatrice DELENA Cage, MD;  Location: [facility];  Service: Cardiovascular;;  Limited left femoral runoff right femoral runoff   PERIPHERAL VASCULAR BALLOON ANGIOPLASTY  07/17/2023   Procedure: PERIPHERAL VASCULAR BALLOON ANGIOPLASTY;  Surgeon: Cage Deatrice DELENA, MD;  Location: [facility];  Service: Cardiovascular;;   PERIPHERAL VASCULAR CATHETERIZATION N/A 05/02/2016  Procedure: Abdominal Aortogram;  Surgeon: Deatrice DELENA Cage, MD;  Location: [facility];  Service: Cardiovascular;  Laterality: N/A;   PERIPHERAL VASCULAR CATHETERIZATION Right 05/02/2016   Procedure: Peripheral Vascular Balloon Angioplasty;  Surgeon: Deatrice DELENA Cage, MD;  Location: [facility];  Service: Cardiovascular;  Laterality: Right;  SFA   TEE WITHOUT CARDIOVERSION N/A 03/13/2016   Procedure: TRANSESOPHAGEAL ECHOCARDIOGRAM (TEE);  Surgeon: Maude Fleeta Ochoa, MD;  Location: [facility] OR;  Service: Open Heart Surgery;  Laterality: N/A;   VSD REPAIR N/A 03/13/2016   Procedure: VENTRICULAR SEPTAL DEFECT (VSD) REPAIR;  Surgeon: Maude Fleeta Ochoa, MD;  Location: [facility] OR;  Service: Open Heart Surgery;  Laterality: N/A;   FAMILY HISTORY Family History  Problem Relation Age of Onset   Diabetes Maternal Grandmother    Diabetes Mother    Aneurysm Mother    Peripheral Artery Disease Mother    Coronary artery disease Mother    Peptic Ulcer Father    Retinoblastoma Daughter    Colon cancer Neg Hx    Rectal cancer Neg Hx    SOCIAL  HISTORY Social History   Tobacco Use   Smoking status: Former    Current packs/day: 0.00    Types: Cigarettes    Quit date: 11/20/2015    Years since quitting: 8.4   Smokeless tobacco: Never   Tobacco comments:    quit 2018  Vaping Use   Vaping status: Never Used  Substance Use Topics   Alcohol use: No   Drug use: No       OPHTHALMIC EXAM: Base Eye Exam     Visual Acuity (Snellen - Linear)       Right Left   Dist [place] 20/25 20/20 -1   Dist ph [place] 20/25 +2          Tonometry (Tonopen, 8:00 AM)       Right Left   Pressure 15 15         Pupils       Pupils Dark Light Shape React APD   Right PERRL 2 1 Round Brisk None   Left PERRL 2 1 Round Brisk None         Visual Fields       Left Right    Full Full         Extraocular Movement       Right Left    Full, Ortho Full, Ortho         Neuro/Psych     Oriented x3: Yes   Mood/Affect: Normal         Dilation     Both eyes: 1.0% Mydriacyl, 2.5% Phenylephrine  @ 8:00 AM           Slit Lamp and Fundus Exam     Slit Lamp Exam       Right Left   Lids/Lashes Dermatochalasis - upper lid, mild MGD Dermatochalasis - upper lid, mild MGD   Conjunctiva/Sclera White and quiet White and quiet   Cornea trace PEE, well healed cataract wound, mild tear film debris 1+ fine PEE, mild tear film debris, well healed cataract wound   Anterior Chamber Deep and quiet Deep and quiet   Iris Round and dilated, No NVI Round and dilated, No NVI   Lens PC IOL in good position PC IOL in good position   Anterior Vitreous Vitreous syneresis, vitreous condensations Vitreous syneresis, Posterior vitreous detachment, vitreous condensations         Fundus Exam  Right Left   Disc trace Pallor, Sharp rim mild Pallor, Sharp rim, mild tilt   C/D Ratio 0.3 0.3   Macula Flat, good foveal reflex, scattered Microaneurysms, cystic changes inferior fovea and mac -- improved, good focal laser changes superiorly, good focal  laser targets inferior macula Flat, good foveal reflex, scattered MA -- stably improved, persistent cystic changes temporal and IT mac, good focal laser changes   Vessels attenuated, mild tortuosity, no NV attenuated, Tortuous   Periphery Attached, +MA greatest posteriorly Attached, scattered MA greatest posteriorly           IMAGING AND PROCEDURES  Imaging and Procedures for 04/27/2024  OCT, Retina - OU - Both Eyes       Right Eye Quality was good. Central Foveal Thickness: 274. Progression has improved. Findings include no SRF, abnormal foveal contour, intraretinal hyper-reflective material, intraretinal fluid, vitreomacular adhesion (Mild interval improvement in IRF/cystic changes inferior fovea and mac ).   Left Eye Quality was good. Central Foveal Thickness: 264. Progression has worsened. Findings include normal foveal contour, no SRF, intraretinal hyper-reflective material, intraretinal fluid (Persistent cystic changes IN and temporal macula-- slightly increased).   Notes *Images captured and stored on drive  Diagnosis / Impression:  +DME OU OD: mild interval improvement in IRF/cystic changes inferior fovea and mac  OS: Persistent cystic changes IN and temporal macula -- slightly increased  Clinical management:  See below  Abbreviations: NFP - Normal foveal profile. CME - cystoid macular edema. PED - pigment epithelial detachment. IRF - intraretinal fluid. SRF - subretinal fluid. EZ - ellipsoid zone. ERM - epiretinal membrane. ORA - outer retinal atrophy. ORT - outer retinal tubulation. SRHM - subretinal hyper-reflective material. IRHM - intraretinal hyper-reflective material      Fluorescein  Angiography Optos (Transit OD)       Right Eye Progression has been stable. Early phase findings include microaneurysm. Mid/Late phase findings include leakage, microaneurysm (Scattered late leaking MA greatest inferior to fovea, No NV).   Left Eye Progression has been stable.  Early phase findings include microaneurysm. Mid/Late phase findings include leakage, microaneurysm (Scattered late leaking MA greatest ST to fovea, No NV).   Notes **Images stored on drive**  Impression: Moderate NPDR OU Late leaking MA OU No NV OU OD: Scattered late leaking MA greatest inferior to fovea, No NV OS: Scattered late leaking MA greatest ST to fovea, No NV      Intravitreal Injection, Pharmacologic Agent - OD - Right Eye       Time Out 04/27/2024. 9:07 AM. Confirmed correct patient, procedure, site, and patient consented.   Anesthesia Topical anesthesia was used. Anesthetic medications included Lidocaine  2%, Proparacaine 0.5%.   Procedure Preparation included 5% betadine  to ocular surface, eyelid speculum. A supplied (32g) needle was used.   Injection: 1.25 mg Bevacizumab  1.25mg /0.40ml   Route: Intravitreal, Site: Right Eye   NDC: [id number], Lot: [id number], Expiration date: 05/17/2024   Post-op Post injection exam found visual acuity of at least counting fingers. The patient tolerated the procedure well. There were no complications. The patient received written and verbal post procedure care education. Post injection medications were not given.      Intravitreal Injection, Pharmacologic Agent - OS - Left Eye       Time Out 04/27/2024. 9:07 AM. Confirmed correct patient, procedure, site, and patient consented.   Anesthesia Topical anesthesia was used. Anesthetic medications included Lidocaine  2%, Proparacaine 0.5%.   Procedure Preparation included 5% betadine  to ocular surface, eyelid speculum. A (32g) needle was  used.   Injection: 1.25 mg Bevacizumab  1.25mg /0.56ml   Route: Intravitreal, Site: Left Eye   NDC: C2662926, Lot: [id number], Expiration date: 07/11/2024   Post-op Post injection exam found visual acuity of at least counting fingers. The patient tolerated the procedure well. There were no complications. The patient received written and verbal  post procedure care education. Post injection medications were not given.      POCT INR      Component Value Flag Ref Range Units Status   INR 2.2      2.0 - 3.0  Final   POC INR                           ASSESSMENT/PLAN:    ICD-10-CM   1. Moderate nonproliferative diabetic retinopathy of both eyes with macular edema associated with type 2 diabetes mellitus ([name])  E11.3313 OCT, Retina - OU - Both Eyes    Fluorescein  Angiography Optos (Transit OD)    Intravitreal Injection, Pharmacologic Agent - OD - Right Eye    Intravitreal Injection, Pharmacologic Agent - OS - Left Eye    Bevacizumab  (AVASTIN ) SOLN 1.25 mg    Bevacizumab  (AVASTIN ) SOLN 1.25 mg    2. Current use of insulin  ([name])  Z79.4     3. Long-term (current) use of injectable non-insulin  antidiabetic drugs  Z79.85     4. Essential hypertension  I10     5. Hypertensive retinopathy of both eyes  H35.033 Fluorescein  Angiography Optos (Transit OD)    6. Pseudophakia, both eyes  Z96.1      1-3. Moderate non-proliferative diabetic retinopathy, both eyes  - A1c was 6.7 (06.06.25), 6.5 (02.17.25) - s/p IVA OD #1 (11.14.22), #2 (12.12.22), #3 (01.09.23), #4 (06.23.25), #5 (08.04.25), #6(09.02.25), #7 (09.29.25) - s/p IVA OS #1 (10.17.22), #2 (11.14.22), #3 (12.12.22), #4 (01.09.23), #5 (03.15.23), #6 (04.12.23), #7 (08.04.25), #8 (09.02.25), #9 (09.29.25) ================== - s/p IVE OD #1 (03.15.23), #2 (04.12.23), #3 (05.10.23), #4 (06.07.23), #5 (07.05.23), #6 (08.02.23), #7 (08.31.23), #8 (09.28.23), #9 (10.30.23), #10 (11.28.23), #11 (12.28.23), #12 (01.29.24), #13 (02.20.24), #14 (03.25.24), #15 (04.22.24), #16 (05.20.24), #17 (06.17.24), #18 (07.22.24), #19 (08.26.24), #20 (09.30.24), #21 (11.04.24), #22 (12.16.24) - s/p IVE OS #1 (05.10.23), #2 (06.07.23), #3 (07.05.23), #4 (08.02.23), #5 (08.31.23), #6 (09.28.23), #7 (10.30.23), #8 (11.28.23), #9 (12.28.23), #10 (01.29.24), #11 (02.20..24), #12 (03.25.24), #13  (04.22.24), #14 (05.20.24), #15 (06.17.24), #16 (07.22.24), #17 (09.30.24), #18 (11.04.24), #19 (12.16.24) ==================== - s/p focal laser OS (06.25.24) - s/p focal laser OD (01.13.25) - exam shows scattered MA, DBH OU - FA (10.17.22) shows late leaking MA OU, no NV OU - repeat FA 10.27.25) shows OD: Scattered late leaking MA greatest inferior to fovea, No NV, OS: Scattered late leaking MA greatest ST to fovea, No NV - OCT shows OD: mild interval improvement in IRF/cystic changes inferior fovea and mac; OS: Persistent cystic changes IN and temporal macula- slightly increased at 4 wks - BCVA OD 20/25 - stable; OS stable at 20/20 - recommend IVA today OD #8 and OS #10 (10.27.25) with follow up in 4-5 weeks ([employer] funding unavailable) - pt wishes to proceed with injections - RBA of procedure discussed, questions answered - IVA informed consent obtained and signed, 06.23.25 - see procedure note - f/u 4-5 weeks, sooner prn -- DFE, OCT, possible injection(s) - 2 weeks 11.12.25 for DFE, OCT, possible focal laser OD  4,5. Hypertensive retinopathy OU - discussed importance of tight BP control  - monitor  6. Pseudophakia OU  - s/p CE/IOL OU  - IOL in good position, doing well  - monitor  Ophthalmic Meds Ordered this visit:  Meds ordered this encounter  Medications   Bevacizumab  (AVASTIN ) SOLN 1.25 mg   Bevacizumab  (AVASTIN ) SOLN 1.25 mg     Return in about 5 weeks (around 06/01/2024) for f/u, NPDR, DFE, OCT, Possible, IVA, OU, 2 wks DFE, OCT, possible focal OD.  There are no Patient Instructions on file for this visit.  Explained the diagnoses, plan, and follow up with the patient and they expressed understanding.  Patient expressed understanding of the importance of proper follow up care.   This document serves as a record of services personally performed by Redell JUDITHANN Hans, MD, PhD. It was created on their behalf by Avelina Pereyra, COA an ophthalmic technician. The creation of  this record is the provider's dictation and/or activities during the visit.   Electronically signed by: Avelina GORMAN Pereyra, COT  04/30/24  8:39 PM   This document serves as a record of services personally performed by Redell JUDITHANN Hans, MD, PhD. It was created on their behalf by Wanda GEANNIE Keens, COT an ophthalmic technician. The creation of this record is the provider's dictation and/or activities during the visit.    Electronically signed by:  Wanda GEANNIE Keens, COT  04/30/24 8:39 PM  Redell JUDITHANN Hans, M.D., Ph.D. Diseases & Surgery of the Retina and Vitreous [facility]  I have reviewed the above documentation for accuracy and completeness, and I agree with the above. Redell JUDITHANN Hans, M.D., Ph.D. 04/30/24 8:39 PM   Abbreviations: M myopia (nearsighted); A astigmatism; H hyperopia (farsighted); P presbyopia; Mrx spectacle prescription;  CTL contact lenses; OD right eye; OS left eye; OU both eyes  XT exotropia; ET esotropia; PEK punctate epithelial keratitis; PEE punctate epithelial erosions; DES dry eye syndrome; MGD meibomian gland dysfunction; ATs artificial tears; PFAT's preservative free artificial tears; NSC nuclear sclerotic cataract; PSC posterior subcapsular cataract; ERM epi-retinal membrane; PVD posterior vitreous detachment; RD retinal detachment; DM diabetes mellitus; DR diabetic retinopathy; NPDR non-proliferative diabetic retinopathy; PDR proliferative diabetic retinopathy; CSME clinically significant macular edema; DME diabetic macular edema; dbh dot blot hemorrhages; CWS cotton wool spot; POAG primary open angle glaucoma; C/D cup-to-disc ratio; HVF humphrey visual field; GVF goldmann visual field; OCT optical coherence tomography; IOP intraocular pressure; BRVO Branch retinal vein occlusion; CRVO central retinal vein occlusion; CRAO central retinal artery occlusion; BRAO branch retinal artery occlusion; RT retinal tear; SB scleral buckle; PPV pars plana  vitrectomy; VH Vitreous hemorrhage; PRP panretinal laser photocoagulation; IVK intravitreal kenalog; VMT vitreomacular traction; MH Macular hole;  NVD neovascularization of the disc; NVE neovascularization elsewhere; AREDS age related eye disease study; ARMD age related macular degeneration; POAG primary open angle glaucoma; EBMD epithelial/anterior basement membrane dystrophy; ACIOL anterior chamber intraocular lens; IOL intraocular lens; PCIOL posterior chamber intraocular lens; Phaco/IOL phacoemulsification with intraocular lens placement; PRK photorefractive keratectomy; LASIK laser assisted in situ keratomileusis; HTN hypertension; DM diabetes mellitus; COPD chronic obstructive pulmonary disease

## 2024-04-27 ENCOUNTER — Ambulatory Visit: Attending: Cardiovascular Disease | Admitting: *Deleted

## 2024-04-27 ENCOUNTER — Encounter (INDEPENDENT_AMBULATORY_CARE_PROVIDER_SITE_OTHER): Payer: Self-pay | Admitting: Ophthalmology

## 2024-04-27 ENCOUNTER — Ambulatory Visit (INDEPENDENT_AMBULATORY_CARE_PROVIDER_SITE_OTHER): Admitting: Ophthalmology

## 2024-04-27 VITALS — BP 87/58

## 2024-04-27 DIAGNOSIS — Z794 Long term (current) use of insulin: Secondary | ICD-10-CM | POA: Diagnosis not present

## 2024-04-27 DIAGNOSIS — I10 Essential (primary) hypertension: Secondary | ICD-10-CM | POA: Diagnosis not present

## 2024-04-27 DIAGNOSIS — Z7985 Long-term (current) use of injectable non-insulin antidiabetic drugs: Secondary | ICD-10-CM | POA: Diagnosis not present

## 2024-04-27 DIAGNOSIS — Z7901 Long term (current) use of anticoagulants: Secondary | ICD-10-CM | POA: Diagnosis not present

## 2024-04-27 DIAGNOSIS — E113313 Type 2 diabetes mellitus with moderate nonproliferative diabetic retinopathy with macular edema, bilateral: Secondary | ICD-10-CM | POA: Diagnosis not present

## 2024-04-27 DIAGNOSIS — I480 Paroxysmal atrial fibrillation: Secondary | ICD-10-CM | POA: Diagnosis not present

## 2024-04-27 DIAGNOSIS — H35033 Hypertensive retinopathy, bilateral: Secondary | ICD-10-CM

## 2024-04-27 DIAGNOSIS — Z961 Presence of intraocular lens: Secondary | ICD-10-CM

## 2024-04-27 LAB — POCT INR: INR: 2.2 (ref 2.0–3.0)

## 2024-04-27 NOTE — Patient Instructions (Addendum)
 Description   INR 2.2; Continue taking 1.5 tablets daily except for 1 tablet every Mondays and Fridays. Recheck INR in 5 weeks. Stay consistent with [facility] [phone]

## 2024-04-27 NOTE — Progress Notes (Signed)
 Description   INR 2.2; Continue taking 1.5 tablets daily except for 1 tablet every Mondays and Fridays. Recheck INR in 5 weeks. Stay consistent with [facility] [phone]

## 2024-04-29 ENCOUNTER — Ambulatory Visit: Payer: Self-pay | Admitting: Internal Medicine

## 2024-04-29 ENCOUNTER — Ambulatory Visit (INDEPENDENT_AMBULATORY_CARE_PROVIDER_SITE_OTHER): Admitting: Internal Medicine

## 2024-04-29 ENCOUNTER — Encounter: Payer: Self-pay | Admitting: Internal Medicine

## 2024-04-29 ENCOUNTER — Other Ambulatory Visit: Payer: Self-pay

## 2024-04-29 ENCOUNTER — Ambulatory Visit (INDEPENDENT_AMBULATORY_CARE_PROVIDER_SITE_OTHER)

## 2024-04-29 ENCOUNTER — Other Ambulatory Visit: Payer: Self-pay | Admitting: Internal Medicine

## 2024-04-29 ENCOUNTER — Other Ambulatory Visit (HOSPITAL_BASED_OUTPATIENT_CLINIC_OR_DEPARTMENT_OTHER): Payer: Self-pay

## 2024-04-29 VITALS — BP 132/78 | HR 93 | Temp 97.7°F | Resp 16 | Ht 65.0 in | Wt 167.0 lb

## 2024-04-29 DIAGNOSIS — R10817 Generalized abdominal tenderness: Secondary | ICD-10-CM

## 2024-04-29 DIAGNOSIS — E1122 Type 2 diabetes mellitus with diabetic chronic kidney disease: Secondary | ICD-10-CM | POA: Diagnosis not present

## 2024-04-29 DIAGNOSIS — I480 Paroxysmal atrial fibrillation: Secondary | ICD-10-CM | POA: Diagnosis not present

## 2024-04-29 DIAGNOSIS — K635 Polyp of colon: Secondary | ICD-10-CM | POA: Insufficient documentation

## 2024-04-29 DIAGNOSIS — I10 Essential (primary) hypertension: Secondary | ICD-10-CM

## 2024-04-29 DIAGNOSIS — R052 Subacute cough: Secondary | ICD-10-CM | POA: Insufficient documentation

## 2024-04-29 DIAGNOSIS — R634 Abnormal weight loss: Secondary | ICD-10-CM

## 2024-04-29 DIAGNOSIS — N1832 Chronic kidney disease, stage 3b: Secondary | ICD-10-CM | POA: Diagnosis not present

## 2024-04-29 DIAGNOSIS — K219 Gastro-esophageal reflux disease without esophagitis: Secondary | ICD-10-CM

## 2024-04-29 DIAGNOSIS — R748 Abnormal levels of other serum enzymes: Secondary | ICD-10-CM

## 2024-04-29 DIAGNOSIS — I700 Atherosclerosis of aorta: Secondary | ICD-10-CM | POA: Diagnosis not present

## 2024-04-29 DIAGNOSIS — I2510 Atherosclerotic heart disease of native coronary artery without angina pectoris: Secondary | ICD-10-CM

## 2024-04-29 DIAGNOSIS — R109 Unspecified abdominal pain: Secondary | ICD-10-CM | POA: Diagnosis not present

## 2024-04-29 DIAGNOSIS — I2583 Coronary atherosclerosis due to lipid rich plaque: Secondary | ICD-10-CM

## 2024-04-29 LAB — CBC WITH DIFFERENTIAL/PLATELET
Basophils Absolute: 0 K/uL (ref 0.0–0.1)
Basophils Relative: 0.3 % (ref 0.0–3.0)
Eosinophils Absolute: 0.1 K/uL (ref 0.0–0.7)
Eosinophils Relative: 2.1 % (ref 0.0–5.0)
HCT: 42 % (ref 39.0–52.0)
Hemoglobin: 14.4 g/dL (ref 13.0–17.0)
Lymphocytes Relative: 25.6 % (ref 12.0–46.0)
Lymphs Abs: 1.6 K/uL (ref 0.7–4.0)
MCHC: 34.2 g/dL (ref 30.0–36.0)
MCV: 86.9 fl (ref 78.0–100.0)
Monocytes Absolute: 0.4 K/uL (ref 0.1–1.0)
Monocytes Relative: 6.5 % (ref 3.0–12.0)
Neutro Abs: 4 K/uL (ref 1.4–7.7)
Neutrophils Relative %: 65.5 % (ref 43.0–77.0)
Platelets: 180 K/uL (ref 150.0–400.0)
RBC: 4.83 Mil/uL (ref 4.22–5.81)
RDW: 14.8 % (ref 11.5–15.5)
WBC: 6.1 K/uL (ref 4.0–10.5)

## 2024-04-29 LAB — URINALYSIS, ROUTINE W REFLEX MICROSCOPIC
Bilirubin Urine: NEGATIVE
Ketones, ur: NEGATIVE
Leukocytes,Ua: NEGATIVE
Nitrite: NEGATIVE
Specific Gravity, Urine: <=1.005 — AB (ref 1.000–1.030)
Urine Glucose: >=1000 — AB
Urobilinogen, UA: 2 — AB (ref 0.0–1.0)
pH: 5.5 (ref 5.0–8.0)

## 2024-04-29 LAB — LIPASE: Lipase: 28 U/L (ref 11.0–59.0)

## 2024-04-29 LAB — BASIC METABOLIC PANEL WITH GFR
BUN: 37 mg/dL — ABNORMAL HIGH (ref 6–23)
CO2: 26 meq/L (ref 19–32)
Calcium: 9.6 mg/dL (ref 8.4–10.5)
Chloride: 101 meq/L (ref 96–112)
Creatinine, Ser: 1.58 mg/dL — ABNORMAL HIGH (ref 0.40–1.50)
GFR: 43.29 mL/min — ABNORMAL LOW (ref >60.00)
Glucose, Bld: 134 mg/dL — ABNORMAL HIGH (ref 70–99)
Potassium: 4 meq/L (ref 3.5–5.1)
Sodium: 139 meq/L (ref 135–145)

## 2024-04-29 LAB — HEPATIC FUNCTION PANEL
ALT: 68 U/L — ABNORMAL HIGH (ref 0–53)
AST: 60 U/L — ABNORMAL HIGH (ref 0–37)
Albumin: 4.9 g/dL (ref 3.5–5.2)
Alkaline Phosphatase: 136 U/L — ABNORMAL HIGH (ref 39–117)
Bilirubin, Direct: 0.2 mg/dL (ref 0.0–0.3)
Total Bilirubin: 0.7 mg/dL (ref 0.2–1.2)
Total Protein: 7.2 g/dL (ref 6.0–8.3)

## 2024-04-29 LAB — C-REACTIVE PROTEIN: CRP: 0.7 mg/dL (ref 0.5–20.0)

## 2024-04-29 LAB — HEMOGLOBIN A1C: Hgb A1c MFr Bld: 6.8 % — ABNORMAL HIGH (ref 4.6–6.5)

## 2024-04-29 LAB — TRIGLYCERIDES: Triglycerides: 314 mg/dL — ABNORMAL HIGH (ref 0.0–149.0)

## 2024-04-29 MED ORDER — COVID-19 MRNA VAC-TRIS(PFIZER) 30 MCG/0.3ML IM SUSY
0.3000 mL | PREFILLED_SYRINGE | Freq: Once | INTRAMUSCULAR | 0 refills | Status: AC
  Filled 2024-04-29: qty 0.3, 1d supply, fill #0

## 2024-04-29 MED FILL — Insulin Glargine Soln Pen-Injector 100 Unit/ML: SUBCUTANEOUS | 90 days supply | Qty: 36 | Fill #1 | Status: AC

## 2024-04-29 NOTE — Patient Instructions (Signed)
 Abdominal Pain, Adult  Pain in the abdomen (abdominal pain) can be caused by many things. In most cases, it gets better with no treatment or by being treated at home. But in some cases, it can be serious. Your health care provider will ask questions about your medical history and do a physical exam to try to figure out what is causing your pain. Follow these instructions at home: Medicines Take over-the-counter and prescription medicines only as told by your provider. Do not take medicines that help you poop (laxatives) unless told by your provider. General instructions Watch your condition for any changes. Drink enough fluid to keep your pee (urine) pale yellow. Contact a health care provider if: Your pain changes, gets worse, or lasts longer than expected. You have severe cramping or bloating in your abdomen, or you vomit. Your pain gets worse with meals, after eating, or with certain foods. You are constipated or have diarrhea for more than 2-3 days. You are not hungry, or you lose weight without trying. You have signs of dehydration. These may include: Dark pee, very little pee, or no pee. Cracked lips or dry mouth. Sleepiness or weakness. You have pain when you pee (urinate) or poop. Your abdominal pain wakes you up at night. You have blood in your pee. You have a fever. Get help right away if: You cannot stop vomiting. Your pain is only in one part of the abdomen. Pain on the right side could be caused by appendicitis. You have bloody or black poop (stool), or poop that looks like tar. You have trouble breathing. You have chest pain. These symptoms may be an emergency. Get help right away. Call 911. Do not wait to see if the symptoms will go away. Do not drive yourself to the hospital. This information is not intended to replace advice given to you by your health care provider. Make sure you discuss any questions you have with your health care provider. Document Revised:  04/04/2022 Document Reviewed: 04/04/2022 Elsevier Patient Education  2024 [employer].

## 2024-04-29 NOTE — Progress Notes (Signed)
 Subjective:  Patient ID: William [name], male    DOB: October 30, 1950  Age: 73 y.o. MRN: [id number]  CC: Abdominal Pain and Cough   HPI William [name] presents for f/up --  Discussed the use of AI scribe software for clinical note transcription with the patient, who gave verbal consent to proceed.  History of Present Illness William [name] is a 73 year old male who presents with abdominal pain and constipation.  He has been experiencing abdominal pain primarily in the left lower quadrant and groin area for the past couple of months. The pain radiates around his abdomen and is associated with constipation. He reports that the pain is so bad that he can't walk, especially during bowel movements. No nausea, vomiting, chest pain, or shortness of breath.  He has a history of chronic constipation, typically going four to five days without a bowel movement. He has tried various treatments including Miralax , but they have not been effective. Recently, he experienced diarrhea which resolved after taking Imodium, but it took a couple of days for his stools to become solid again.  He mentions a previous consultation with a urologist about five months ago, where it was noted that his prostate was slightly swollen, possibly due to a procedure involving a camera. He is scheduled for follow-up lab work in November to reassess the condition.  He reports a one week history of mild cough with mucus production.    Outpatient Medications Prior to Visit  Medication Sig Dispense Refill   acetaminophen  (TYLENOL ) 500 MG tablet Take 500 mg by mouth every 6 (six) hours as needed for moderate pain.     aspirin  EC 81 MG tablet Take 1 tablet (81 mg total) by mouth daily. Swallow whole.     Blood Glucose Monitoring Suppl DEVI Use to test blood sugar morning, at noon, and at bedtime. 1 each 1   carvedilol  (COREG ) 3.125 MG tablet Take 1 tablet (3.125 mg total) by mouth 2 (two) times daily with a meal. 180 tablet 1   Continuous  Glucose Receiver (DEXCOM G7 RECEIVER) DEVI Use to monitor blood sugar. 9 each 1   Continuous Glucose Sensor (DEXCOM G7 SENSOR) MISC INJECT 1 SENSOR EVERY 10 DAYS TO MONITOR BLOOD SUGAR 9 each 1   dapagliflozin  propanediol (FARXIGA ) 10 MG TABS tablet Take 1 tablet (10 mg total) by mouth daily. 90 tablet 1   gabapentin  (NEURONTIN ) 300 MG capsule Take 1 capsule (300 mg total) by mouth 2 (two) times daily. 180 capsule 0   glucose blood (ACCU-CHEK GUIDE TEST) test strip USE TO CHECK BLOOD SUGAR IN THE AM, AT NOON AND AT BEDTIME 100 strip 5   icosapent  Ethyl (VASCEPA ) 1 g capsule Take 2 capsules (2 g total) by mouth 2 (two) times daily. 360 capsule 1   insulin  glargine (LANTUS  SOLOSTAR) 100 UNIT/ML Solostar Pen ADMINISTER 40 UNITS UNDER THE SKIN AT BEDTIME 36 mL 1   Insulin  Glargine Solostar (LANTUS ) 100 UNIT/ML Solostar Pen Inject 55 Units into the skin every evening. 45 mL 0   Insulin  Pen Needle 31G X 8 MM MISC Use once daily as directed. 100 each 2   Insulin  Pen Needle 31G X 8 MM MISC Use to inject insulin  four times daily. 300 each 2   Lancets Misc. (ACCU-CHEK FASTCLIX LANCET) KIT Use in the morning, noon and at bedtime. 1 kit 5   OZEMPIC , 0.25 OR 0.5 MG/DOSE, 2 MG/3ML SOPN Inject 0.25 mg into the skin once a week.     pantoprazole  (  PROTONIX ) 40 MG tablet TAKE 1 TABLET(40 MG) BY MOUTH TWICE DAILY BEFORE A MEAL 180 tablet 1   pramipexole  (MIRAPEX ) 0.125 MG tablet Take 1 tablet (0.125 mg total) by mouth at bedtime. 90 tablet 0   rosuvastatin  (CRESTOR ) 40 MG tablet Take 1 tablet (40 mg total) by mouth daily. 90 tablet 1   torsemide  (DEMADEX ) 20 MG tablet Take 1 tablet (20 mg total) by mouth 3 (three) times a week. 36 tablet 1   warfarin (COUMADIN ) 5 MG tablet TAKE 1 TO 1 AND 1/2 TABLETS BY MOUTH DAILY AS DIRECTED BY COUMADIN  CLINIC 120 tablet 1   No facility-administered medications prior to visit.    ROS Review of Systems  Constitutional:  Positive for unexpected weight change (wt loss). Negative  for appetite change, chills, diaphoresis, fatigue and fever.  HENT: Negative.  Negative for trouble swallowing.   Eyes: Negative.  Negative for visual disturbance.  Respiratory:  Positive for cough. Negative for chest tightness, shortness of breath and wheezing.   Cardiovascular:  Negative for chest pain, palpitations and leg swelling.  Gastrointestinal:  Positive for abdominal pain and constipation. Negative for abdominal distention, anal bleeding, blood in stool, diarrhea, nausea and vomiting.  Endocrine: Negative.   Genitourinary: Negative.  Negative for difficulty urinating, dysuria, flank pain and hematuria.  Musculoskeletal:  Positive for back pain. Negative for myalgias.  Skin: Negative.   Neurological: Negative.  Negative for dizziness, weakness and light-headedness.  Hematological:  Negative for adenopathy. Does not bruise/bleed easily.  Psychiatric/Behavioral: Negative.      Objective:  BP 132/78 (BP Location: Left Arm, Patient Position: Sitting, Cuff Size: Normal)   Pulse 93   Temp 97.7 F (36.5 C) (Oral)   Resp 16   Ht 5' 5 (1.651 m)   Wt 167 lb (75.8 kg)   SpO2 98%   BMI 27.79 kg/m   BP Readings from Last 3 Encounters:  04/29/24 132/78  04/27/24 (!) 87/58  04/02/24 112/78    Wt Readings from Last 3 Encounters:  04/29/24 167 lb (75.8 kg)  04/02/24 171 lb 9.6 oz (77.8 kg)  02/24/24 175 lb (79.4 kg)    Physical Exam Vitals reviewed.  Constitutional:      General: He is not in acute distress.    Appearance: He is ill-appearing. He is not toxic-appearing or diaphoretic.  HENT:     Mouth/Throat:     Mouth: Mucous membranes are moist.  Eyes:     General: No scleral icterus.    Conjunctiva/sclera: Conjunctivae normal.  Cardiovascular:     Rate and Rhythm: Normal rate and regular rhythm.     Heart sounds: Normal heart sounds and S1 normal. No murmur heard.    No friction rub. No gallop.     Comments: EKG--- NSR, 91 bpm Inferior infarct pattern is not  new No LVH Unchanged Pulmonary:     Effort: No respiratory distress.     Breath sounds: No wheezing, rhonchi or rales.  Chest:     Chest wall: No tenderness.  Abdominal:     General: Abdomen is protuberant. Bowel sounds are normal. There is no distension.     Palpations: Abdomen is soft. There is no hepatomegaly, splenomegaly or mass.     Tenderness: There is generalized abdominal tenderness. There is no guarding or rebound.     Hernia: No hernia is present.  Musculoskeletal:     Cervical back: Neck supple.     Right lower leg: No edema.     Left lower  leg: No edema.  Skin:    General: Skin is warm and dry.  Neurological:     Mental Status: He is alert. Mental status is at baseline.  Psychiatric:        Mood and Affect: Mood normal.        Behavior: Behavior normal.     Lab Results  Component Value Date   WBC 6.1 04/29/2024   HGB 14.4 04/29/2024   HCT 42.0 04/29/2024   PLT 180.0 04/29/2024   GLUCOSE 134 (H) 04/29/2024   CHOL 106 08/19/2023   TRIG 314.0 (H) 04/29/2024   HDL 26.10 (L) 08/19/2023   LDLDIRECT 54.0 11/05/2022   LDLCALC 20 08/19/2023   ALT 68 (H) 04/29/2024   AST 60 (H) 04/29/2024   NA 139 04/29/2024   K 4.0 04/29/2024   CL 101 04/29/2024   CREATININE 1.58 (H) 04/29/2024   BUN 37 (H) 04/29/2024   CO2 26 04/29/2024   TSH 0.655 10/01/2023   PSA 3.50 05/07/2022   INR 2.2 04/27/2024   HGBA1C 6.8 (H) 04/29/2024   MICROALBUR 3.1 (H) 01/14/2024    VAS US  LOWER EXTREMITY ARTERIAL DUPLEX Result Date: 02/17/2024 LOWER EXTREMITY ARTERIAL DUPLEX STUDY Patient Name:  William [name]  Date of Exam:   02/17/2024 Medical Rec #: [id number]    Accession #:    [id number] Date of Birth: 08-19-1950   Patient Gender: M Patient Age:   46 years Exam Location:  [address] Procedure:      VAS US  LOWER EXTREMITY ARTERIAL DUPLEX Referring Phys: [facility] [place] --------------------------------------------------------------------------------  Indications: Peripheral artery disease.  High Risk Factors: Hypertension, hyperlipidemia, coronary artery disease.  Vascular Interventions: Successful drug-coated balloon angioplasty to the left                         SFA on 07/17/2023. Successful angioplasty followed by                         drug-coated balloon angioplasty of the right distal SFA                         on 05/02/2016. Current ABI:            R 0.75 L 0.86 Comparison Study: 08/06/23 Performing Technologist: Duwaine Hives RVS  Examination Guidelines: A complete evaluation includes B-mode imaging, spectral Doppler, color Doppler, and power Doppler as needed of all accessible portions of each vessel. Bilateral testing is considered an integral part of a complete examination. Limited examinations for reoccurring indications may be performed as noted.  +-----------+--------+-----+---------------+----------+--------+ RIGHT      PSV cm/sRatioStenosis       Waveform  Comments +-----------+--------+-----+---------------+----------+--------+ CFA Distal 187          30-49% stenosisbiphasic           +-----------+--------+-----+---------------+----------+--------+ DFA        144                         biphasic           +-----------+--------+-----+---------------+----------+--------+ SFA Prox   351          50-74% stenosisbiphasic           +-----------+--------+-----+---------------+----------+--------+ SFA Mid    223          50-74% stenosismonophasic         +-----------+--------+-----+---------------+----------+--------+ SFA Distal 215  50-74% stenosismonophasic         +-----------+--------+-----+---------------+----------+--------+ POP Prox   127                         triphasic          +-----------+--------+-----+---------------+----------+--------+ POP Distal 92                          monophasic         +-----------+--------+-----+---------------+----------+--------+ TP Trunk   98                          monophasic          +-----------+--------+-----+---------------+----------+--------+ ATA Distal 35                          monophasic         +-----------+--------+-----+---------------+----------+--------+ PTA Distal 25                          monophasic         +-----------+--------+-----+---------------+----------+--------+ PERO Distal79                          monophasic         +-----------+--------+-----+---------------+----------+--------+  +-----------+--------+-----+---------------+----------+--------+ LEFT       PSV cm/sRatioStenosis       Waveform  Comments +-----------+--------+-----+---------------+----------+--------+ CFA Distal 213          50-74% stenosisbiphasic           +-----------+--------+-----+---------------+----------+--------+ DFA        226          50-74% stenosisbiphasic           +-----------+--------+-----+---------------+----------+--------+ SFA Prox   286          50-74% stenosisbiphasic           +-----------+--------+-----+---------------+----------+--------+ SFA Mid    188          30-49% stenosisbiphasic           +-----------+--------+-----+---------------+----------+--------+ SFA Distal 220          50-74% stenosisbiphasic           +-----------+--------+-----+---------------+----------+--------+ POP Prox   160          30-49% stenosisbiphasic           +-----------+--------+-----+---------------+----------+--------+ POP Distal 180          30-49% stenosistriphasic          +-----------+--------+-----+---------------+----------+--------+ TP Trunk   161          30-49% stenosistriphasic          +-----------+--------+-----+---------------+----------+--------+ ATA Distal 69                          monophasic         +-----------+--------+-----+---------------+----------+--------+ PTA Distal 14                          monophasic          +-----------+--------+-----+---------------+----------+--------+ PERO Distal94                          monophasic         +-----------+--------+-----+---------------+----------+--------+  Summary: Right: 30-49% stenosis noted in the common  femoral artery. 50-74% stenosis noted in the superficial femoral artery. Left: 50-74% stenosis noted in the common femoral artery. 50-74% stenosis noted in the deep femoral artery. 50-74% stenosis noted in the superficial femoral artery. 30-49% stenosis noted in the popliteal artery.  See table(s) above for measurements and observations. Suggest follow up study in 6 months. Electronically signed by Evalene Lunger MD on 02/17/2024 at 10:09:05 PM.    Final    VAS US  ABI WITH/WO TBI Result Date: 02/17/2024  LOWER EXTREMITY DOPPLER STUDY Patient Name:  William [name]  Date of Exam:   02/17/2024 Medical Rec #: [id number]    Accession #:    [id number] Date of Birth: [birth year]   Patient Gender: M Patient Age:   76 years Exam Location:  [address] Procedure:      VAS US  ABI WITH/WO TBI Referring Phys: [facility] [place] --------------------------------------------------------------------------------  Indications: Peripheral artery disease. High Risk Factors: Hypertension, hyperlipidemia, coronary artery disease.  Vascular Interventions: Successful drug-coated balloon angioplasty to the left                         SFA on 07/17/2023. Successful angioplasty followed by                         drug-coated balloon angioplasty of the right distal SFA                         on 05/02/2016. Comparison Study: 08/06/23 Performing Technologist: Duwaine Hives RVS  Examination Guidelines: A complete evaluation includes at minimum, Doppler waveform signals and systolic blood pressure reading at the level of bilateral brachial, anterior tibial, and posterior tibial arteries, when vessel segments are accessible. Bilateral testing is considered an integral part of a complete examination.  Photoelectric Plethysmograph (PPG) waveforms and toe systolic pressure readings are included as required and additional duplex testing as needed. Limited examinations for reoccurring indications may be performed as noted.  ABI Findings: +---------+------------------+-----+----------+----------+ Right    Rt Pressure (mmHg)IndexWaveform  Comment    +---------+------------------+-----+----------+----------+ Brachial 108                                         +---------+------------------+-----+----------+----------+ PTA      83                0.67 monophasic           +---------+------------------+-----+----------+----------+ DP       93                0.75 monophasic           +---------+------------------+-----+----------+----------+ Great Toe                                 amputation +---------+------------------+-----+----------+----------+ +---------+------------------+-----+----------+-------+ Left     Lt Pressure (mmHg)IndexWaveform  Comment +---------+------------------+-----+----------+-------+ Brachial 124                                      +---------+------------------+-----+----------+-------+ PTA      107               0.86 monophasic        +---------+------------------+-----+----------+-------+ DP  105               0.85 monophasic        +---------+------------------+-----+----------+-------+ Great Toe64                0.52                   +---------+------------------+-----+----------+-------+ +-------+-----------+-----------+------------+------------+ ABI/TBIToday's ABIToday's TBIPrevious ABIPrevious TBI +-------+-----------+-----------+------------+------------+ Right  0.75       amputation 1.01        amputation   +-------+-----------+-----------+------------+------------+ Left   0.86       0.52       0.97        0.35         +-------+-----------+-----------+------------+------------+ Bilateral ABIs appear  decreased compared to prior study on 08/06/23.  Summary: Right: Resting right ankle-brachial index indicates moderate right lower extremity arterial disease. Left: Resting left ankle-brachial index indicates mild left lower extremity arterial disease. The left toe-brachial index is abnormal. *See table(s) above for measurements and observations.  Suggest follow up study in 6 months. Electronically signed by Evalene Lunger MD on 02/17/2024 at 9:59:21 PM.    Final    DG ABD ACUTE 2+V W 1V CHEST Result Date: 04/29/2024 CLINICAL DATA:  Abdomen pain cough EXAM: DG ABDOMEN ACUTE WITH 1 VIEW CHEST COMPARISON:  01/14/2024 FINDINGS: Single view chest demonstrates sternotomy. Stable cardiomediastinal silhouette with aortic atherosclerosis. No focal opacity. Supine and upright views of the abdomen demonstrate no free air beneath the diaphragm. There is a nonobstructed gas pattern. Vascular calcifications. Average stool burden IMPRESSION: No active cardiopulmonary disease. Nonobstructed gas pattern. Electronically Signed   By: Luke Bun M.D.   On: 04/29/2024 15:44    Fibrosis 4 Score = 2.91  Fib-4 interpretation is not validated for people under 35 or over 75 years of age. However, scores under 2.0 are generally considered low risk.   Assessment & Plan:  Subacute cough -     DG ABD ACUTE 2+V W 1V CHEST; Future  Generalized abdominal tenderness without rebound tenderness -     DG ABD ACUTE 2+V W 1V CHEST; Future -     Lipase; Future -     Hepatic function panel; Future -     Triglycerides; Future -     C-reactive protein; Future -     MR ABDOMEN W WO CONTRAST; Future  Essential hypertension- BP is well controlled. -     Basic metabolic panel with GFR; Future -     CBC with Differential/Platelet; Future -     Urinalysis, Routine w reflex microscopic; Future  Paroxysmal atrial fibrillation ([name])- He has good R/R control.  Coronary artery disease due to lipid rich plaque  Type 2 diabetes mellitus  with stage 3b chronic kidney disease, without long-term current use of insulin  ([name])- Blood sugar is well controlled. -     Basic metabolic panel with GFR; Future -     Hemoglobin A1c; Future -     Urinalysis, Routine w reflex microscopic; Future -     COVID-19 mRNA Vac-TriS([employer]); Inject 0.3 mLs into the muscle once for 1 dose.  Dispense: 0.3 mL; Refill: 0  Polyp of colon, unspecified part of colon, unspecified type -     Ambulatory referral to Gastroenterology  High alkaline phosphatase -     MR ABDOMEN W WO CONTRAST; Future  Weight loss, non-intentional -     MR ABDOMEN W WO CONTRAST; Future     Follow-up: Return in about 3  months (around 07/30/2024).  Debby Molt, MD

## 2024-04-30 ENCOUNTER — Encounter (INDEPENDENT_AMBULATORY_CARE_PROVIDER_SITE_OTHER): Payer: Self-pay | Admitting: Ophthalmology

## 2024-04-30 ENCOUNTER — Other Ambulatory Visit: Payer: Self-pay

## 2024-04-30 ENCOUNTER — Other Ambulatory Visit (HOSPITAL_COMMUNITY): Payer: Self-pay

## 2024-04-30 MED ORDER — BEVACIZUMAB CHEMO INJECTION 1.25MG/0.05ML SYRINGE FOR KALEIDOSCOPE
1.2500 mg | INTRAVITREAL | Status: AC | PRN
  Administered 2024-04-30: 1.25 mg via INTRAVITREAL

## 2024-05-01 ENCOUNTER — Other Ambulatory Visit: Payer: Self-pay

## 2024-05-01 ENCOUNTER — Other Ambulatory Visit (HOSPITAL_COMMUNITY): Payer: Self-pay

## 2024-05-01 MED ORDER — PANTOPRAZOLE SODIUM 40 MG PO TBEC
40.0000 mg | DELAYED_RELEASE_TABLET | Freq: Two times a day (BID) | ORAL | 1 refills | Status: AC
  Filled 2024-05-01: qty 180, 90d supply, fill #0

## 2024-05-01 NOTE — Addendum Note (Signed)
 Addended by: Shanina Kepple , Rilya Longo M on: 05/01/2024 03:00 PM   Modules accepted: Orders

## 2024-05-04 ENCOUNTER — Encounter: Payer: Self-pay | Admitting: Radiology

## 2024-05-04 ENCOUNTER — Ambulatory Visit
Admission: RE | Admit: 2024-05-04 | Discharge: 2024-05-04 | Disposition: A | Discharge status: Home / Self Care | Source: Ambulatory Visit | Attending: Internal Medicine | Admitting: Internal Medicine

## 2024-05-04 DIAGNOSIS — R10817 Generalized abdominal tenderness: Secondary | ICD-10-CM

## 2024-05-04 DIAGNOSIS — R748 Abnormal levels of other serum enzymes: Secondary | ICD-10-CM

## 2024-05-04 DIAGNOSIS — R634 Abnormal weight loss: Secondary | ICD-10-CM

## 2024-05-04 MED ORDER — GADOPICLENOL 0.5 MMOL/ML IV SOLN
7.5000 mL | Freq: Once | INTRAVENOUS | Status: AC | PRN
  Administered 2024-05-04: 7.5 mL via INTRAVENOUS

## 2024-05-05 ENCOUNTER — Encounter: Payer: Self-pay | Admitting: Internal Medicine

## 2024-05-05 ENCOUNTER — Other Ambulatory Visit: Payer: Self-pay | Admitting: Internal Medicine

## 2024-05-05 ENCOUNTER — Other Ambulatory Visit (HOSPITAL_COMMUNITY): Payer: Self-pay

## 2024-05-05 DIAGNOSIS — R10817 Generalized abdominal tenderness: Secondary | ICD-10-CM

## 2024-05-05 DIAGNOSIS — K8020 Calculus of gallbladder without cholecystitis without obstruction: Secondary | ICD-10-CM | POA: Insufficient documentation

## 2024-05-05 DIAGNOSIS — E119 Type 2 diabetes mellitus without complications: Secondary | ICD-10-CM

## 2024-05-05 MED ORDER — DEXCOM G7 SENSOR MISC
1 refills | Status: AC
  Filled 2024-05-05: qty 9, 90d supply, fill #0
  Filled 2024-07-30: qty 9, 90d supply, fill #1

## 2024-05-06 ENCOUNTER — Other Ambulatory Visit: Payer: Self-pay

## 2024-05-06 NOTE — Progress Notes (Signed)
 [facility] - Clinic Note  05/13/2024    CHIEF COMPLAINT Patient presents for Retina Follow Up  HISTORY OF PRESENT ILLNESS: William [name] is a 73 y.o. male who presents to the clinic today for:   HPI     Retina Follow Up   Patient presents with  Diabetic Retinopathy.  In both eyes.  This started 2 weeks ago.  I, the attending physician,  performed the HPI with the patient and updated documentation appropriately.        Comments   Patient here for 2 weeks retina follow up for poss focal OD. Patient states vision doing ok. Sometimes has pain like sand dry eye. Has gall stones going to have surgery. It start and antibiotic.       Last edited by Valdemar Rogue, MD on 05/13/2024  5:39 PM.    Patient states  Referring physician: Joshua Debby CROME, MD [address] [place],  [place] [address]  HISTORICAL INFORMATION:  Selected notes from the MEDICAL RECORD NUMBER Referred by Dr. Lelon for eval of DME OU LEE:  Ocular Hx- PMH-    CURRENT MEDICATIONS: Current Outpatient Medications (Ophthalmic Drugs)  Medication Sig   prednisoLONE  acetate (PRED FORTE ) 1 % ophthalmic suspension Place 1 drop into the right eye 4 (four) times daily for 7 days.   No current facility-administered medications for this visit. (Ophthalmic Drugs)   Current Outpatient Medications (Other)  Medication Sig   acetaminophen  (TYLENOL ) 500 MG tablet Take 500 mg by mouth every 6 (six) hours as needed for moderate pain.   aspirin  EC 81 MG tablet Take 1 tablet (81 mg total) by mouth daily. Swallow whole.   Blood Glucose Monitoring Suppl DEVI Use to test blood sugar morning, at noon, and at bedtime.   carvedilol  (COREG ) 3.125 MG tablet Take 1 tablet (3.125 mg total) by mouth 2 (two) times daily with a meal.   cefpodoxime (VANTIN) 200 MG tablet Take 1 tablet (200 mg total) by mouth 2 (two) times daily for 7 days.   Continuous Glucose Receiver (DEXCOM G7 RECEIVER) DEVI Use to monitor blood  sugar.   Continuous Glucose Sensor (DEXCOM G7 SENSOR) MISC INJECT 1 SENSOR EVERY 10 DAYS TO MONITOR BLOOD SUGAR   dapagliflozin  propanediol (FARXIGA ) 10 MG TABS tablet Take 1 tablet (10 mg total) by mouth daily.   gabapentin  (NEURONTIN ) 300 MG capsule Take 1 capsule (300 mg total) by mouth 2 (two) times daily.   glucose blood (ACCU-CHEK GUIDE TEST) test strip USE TO CHECK BLOOD SUGAR IN THE AM, AT NOON AND AT BEDTIME   icosapent  Ethyl (VASCEPA ) 1 g capsule Take 2 capsules (2 g total) by mouth 2 (two) times daily.   insulin  glargine (LANTUS  SOLOSTAR) 100 UNIT/ML Solostar Pen ADMINISTER 40 UNITS UNDER THE SKIN AT BEDTIME   Insulin  Glargine Solostar (LANTUS ) 100 UNIT/ML Solostar Pen Inject 55 Units into the skin every evening.   Insulin  Pen Needle 31G X 8 MM MISC Use once daily as directed.   Insulin  Pen Needle 31G X 8 MM MISC Use to inject insulin  four times daily.   Lancets Misc. (ACCU-CHEK FASTCLIX LANCET) KIT Use in the morning, noon and at bedtime.   lipase/protease/amylase (CREON) 36000 UNITS CPEP capsule Take 1 capsule (36,000 Units total) by mouth 3 (three) times daily before meals.   OZEMPIC , 0.25 OR 0.5 MG/DOSE, 2 MG/3ML SOPN Inject 0.25 mg into the skin once a week.   pantoprazole  (PROTONIX ) 40 MG tablet Take 1 tablet (40 mg  total) by mouth 2 (two) times daily before a meal.   pramipexole  (MIRAPEX ) 0.125 MG tablet Take 1 tablet (0.125 mg total) by mouth at bedtime.   rosuvastatin  (CRESTOR ) 40 MG tablet Take 1 tablet (40 mg total) by mouth daily.   warfarin (COUMADIN ) 5 MG tablet TAKE 1 TO 1 AND 1/2 TABLETS BY MOUTH DAILY AS DIRECTED BY COUMADIN  CLINIC   torsemide  (DEMADEX ) 20 MG tablet Take 1 tablet (20 mg total) by mouth 3 (three) times a week.   No current facility-administered medications for this visit. (Other)   REVIEW OF SYSTEMS: ROS   Positive for: Gastrointestinal, Genitourinary, Endocrine, Eyes Negative for: Constitutional, Neurological, Skin, Musculoskeletal, HENT,  Cardiovascular, Respiratory, Psychiatric, Allergic/Imm, Heme/Lymph Last edited by Orval Asberry RAMAN, COA on 05/13/2024  9:32 AM.      ALLERGIES Allergies  Allergen Reactions   Morphine And Codeine  Shortness Of Breath and Other (See Comments)    UNSPECIFIED REACTION Pt said it was too much    Latex Rash   PAST MEDICAL HISTORY Past Medical History:  Diagnosis Date   AKI (acute kidney injury)    With STEMI in 2017   Chronic systolic CHF (congestive heart failure) ([name])    Depression    Diabetes mellitus without complication ([name])    Diabetic retinopathy ([name])    GERD (gastroesophageal reflux disease)    Hx of adenomatous colonic polyps 04/07/2018   Hyperlipidemia    Hypertension    Hypertensive retinopathy    Paroxysmal atrial fibrillation ([name])    Peripheral vascular disease    Pneumonia    Seizures ([name])    hx of as a child   STEMI (ST elevation myocardial infarction) ([name]) 2017   Past Surgical History:  Procedure Laterality Date   ABDOMINAL AORTOGRAM W/LOWER EXTREMITY N/A 09/19/2016   Procedure: Abdominal Aortogram w/Lower Extremity;  Surgeon: Deatrice DELENA Cage, MD;  Location: [facility];  Service: Cardiovascular;  Laterality: N/A;   ABDOMINAL AORTOGRAM W/LOWER EXTREMITY N/A 07/17/2023   Procedure: ABDOMINAL AORTOGRAM W/LOWER EXTREMITY;  Surgeon: Cage Deatrice DELENA, MD;  Location: [facility];  Service: Cardiovascular;  Laterality: N/A;   AMPUTATION Right 03/23/2016   Procedure: 1st and 2nd Ray Amputation Right Foot;  Surgeon: Jerona LULLA Sage, MD;  Location: [facility] OR;  Service: Orthopedics;  Laterality: Right;   AMPUTATION Right 06/21/2016   Procedure: RIGHT TRANSMETATARSAL AMPUTATION;  Surgeon: Jerona LULLA Sage, MD;  Location: [facility] OR;  Service: Orthopedics;  Laterality: Right;   CARDIAC CATHETERIZATION N/A 03/13/2016   Procedure: Right/Left Heart Cath and Coronary Angiography;  Surgeon: Ozell Fell, MD;  Location: [facility] INVASIVE CV LAB;  Service: Cardiovascular;   Laterality: N/A;   CARDIAC CATHETERIZATION N/A 03/13/2016   Procedure: IABP Insertion;  Surgeon: Ozell Fell, MD;  Location[facility] INVAS[facility] CV LAB;  Service: Cardiovascular;  Laterality: N/A;   CATARACT EXTRACTION     CATARACT EXTRACTION, BILATERAL     CERVICAL FUSION  1982, 1992   has had 3 neck surgeries from breaking his neck   CIRCUMCISION N/A 11/07/2021   Procedure: CIRCUMCISION ADULT;  Surgeon: Lovie Arlyss CROME, MD;  Location: [facility] ORS;  Service: Urology;  Laterality: N/A;   CORONARY ARTERY BYPASS GRAFT N/A 03/13/2016   Procedure: CORONARY ARTERY BYPASS GRAFTING (CABG) x 1 (SVG to OM) with EVH from LEFT GREATER SAPHENOUS VEIN;  Surgeon: Maude Fleeta Ochoa, MD;  Location: [facility] OR;  Service: Open Heart Surgery;  Laterality: N/A;   EYE SURGERY     LOWER EXTREMITY ANGIOGRAM  05/02/2016  Procedure: Lower Extremity Angiogram;  Surgeon: Deatrice DELENA Cage, MD;  Location: [facility];  Service: Cardiovascular;;  Limited left femoral runoff right femoral runoff   PERIPHERAL VASCULAR BALLOON ANGIOPLASTY  07/17/2023   Procedure: PERIPHERAL VASCULAR BALLOON ANGIOPLASTY;  Surgeon: Cage Deatrice DELENA, MD;  Location: [facility];  Service: Cardiovascular;;   PERIPHERAL VASCULAR CATHETERIZATION N/A 05/02/2016   Procedure: Abdominal Aortogram;  Surgeon: Deatrice DELENA Cage, MD;  Location: [facility];  Service: Cardiovascular;  Laterality: N/A;   PERIPHERAL VASCULAR CATHETERIZATION Right 05/02/2016   Procedure: Peripheral Vascular Balloon Angioplasty;  Surgeon: Deatrice DELENA Cage, MD;  Location: [facility];  Service: Cardiovascular;  Laterality: Right;  SFA   TEE WITHOUT CARDIOVERSION N/A 03/13/2016   Procedure: TRANSESOPHAGEAL ECHOCARDIOGRAM (TEE);  Surgeon: Maude Fleeta Ochoa, MD;  Location: [facility] OR;  Service: Open Heart Surgery;  Laterality: N/A;   VSD REPAIR N/A 03/13/2016   Procedure: VENTRICULAR SEPTAL DEFECT (VSD) REPAIR;  Surgeon: Maude Fleeta Ochoa, MD;  Location: [facility] OR;  Service: Open  Heart Surgery;  Laterality: N/A;   FAMILY HISTORY Family History  Problem Relation Age of Onset   Diabetes Maternal Grandmother    Diabetes Mother    Aneurysm Mother    Peripheral Artery Disease Mother    Coronary artery disease Mother    Peptic Ulcer Father    Retinoblastoma Daughter    Colon cancer Neg Hx    Rectal cancer Neg Hx    SOCIAL HISTORY Social History   Tobacco Use   Smoking status: Former    Current packs/day: 0.00    Types: Cigarettes    Quit date: 11/20/2015    Years since quitting: 8.4   Smokeless tobacco: Never   Tobacco comments:    quit 2018  Vaping Use   Vaping status: Never Used  Substance Use Topics   Alcohol use: No   Drug use: No       OPHTHALMIC EXAM: Base Eye Exam     Visual Acuity (Snellen - Linear)       Right Left   Dist [place] 20/25 -2 20/20         Tonometry (Tonopen, 9:29 AM)       Right Left   Pressure 14 14         Pupils       Dark Light Shape React APD   Right 2 1 Round Brisk None   Left 2 1 Round Brisk None         Visual Fields (Counting fingers)       Left Right    Full Full         Extraocular Movement       Right Left    Full, Ortho Full, Ortho         Neuro/Psych     Oriented x3: Yes   Mood/Affect: Normal         Dilation     Both eyes: 1.0% Mydriacyl, 2.5% Phenylephrine  @ 9:29 AM           Slit Lamp and Fundus Exam     Slit Lamp Exam       Right Left   Lids/Lashes Dermatochalasis - upper lid, mild MGD Dermatochalasis - upper lid, mild MGD   Conjunctiva/Sclera White and quiet White and quiet   Cornea trace PEE, well healed cataract wound, mild tear film debris 1+ fine PEE, mild tear film debris, well healed cataract wound   Anterior Chamber Deep and  quiet Deep and quiet   Iris Round and dilated, No NVI Round and dilated, No NVI   Lens PC IOL in good position PC IOL in good position   Anterior Vitreous Vitreous syneresis, vitreous condensations Vitreous syneresis, Posterior  vitreous detachment, vitreous condensations         Fundus Exam       Right Left   Disc trace Pallor, Sharp rim mild Pallor, Sharp rim, mild tilt   C/D Ratio 0.3 0.3   Macula Flat, good foveal reflex, scattered Microaneurysms, cystic changes inferior fovea and mac -- improved, good focal laser changes superiorly, good focal laser targets inferior macula Flat, good foveal reflex, scattered MA -- stably improved, persistent cystic changes temporal and IT mac, good focal laser changes   Vessels attenuated, mild tortuosity, no NV attenuated, Tortuous   Periphery Attached, +MA greatest posteriorly Attached, scattered MA greatest posteriorly           IMAGING AND PROCEDURES  Imaging and Procedures for 05/13/2024  OCT, Retina - OU - Both Eyes       Right Eye Quality was good. Central Foveal Thickness: 325. Progression has worsened. Findings include no SRF, abnormal foveal contour, intraretinal hyper-reflective material, intraretinal fluid, vitreomacular adhesion (Mild interval increase in IRF/cystic changes inferior fovea and mac ).   Left Eye Quality was good. Central Foveal Thickness: 274. Progression has been stable. Findings include normal foveal contour, no SRF, intraretinal hyper-reflective material, intraretinal fluid (Persistent cystic changes IN and temporal macula).   Notes *Images captured and stored on drive  Diagnosis / Impression:  +DME OU OD: Mild interval increase in IRF/cystic changes inferior fovea and mac  OS: Persistent cystic changes IN and temporal macula   Clinical management:  See below  Abbreviations: NFP - Normal foveal profile. CME - cystoid macular edema. PED - pigment epithelial detachment. IRF - intraretinal fluid. SRF - subretinal fluid. EZ - ellipsoid zone. ERM - epiretinal membrane. ORA - outer retinal atrophy. ORT - outer retinal tubulation. SRHM - subretinal hyper-reflective material. IRHM - intraretinal hyper-reflective material      Focal  Laser - OD - Right Eye       LASER PROCEDURE NOTE  Diagnosis:   Diabetic macular edema, RIGHT EYE  Procedure:  Focal laser photocoagulation using slit lamp laser, RIGHT EYE  Anesthesia:  Topical  Surgeon: Redell Hans, MD, PhD   Informed consent obtained, operative eye marked, and time out performed prior to initiation of laser.   Lumenis [id number] Focal/Grid laser Power: 130 mW Duration: 20 msec  Spot size: 150 microns  # spots: 93 spots placed to MAs inferotemporal to fovea  Complications: None.  RTC: 2 wks as scheduled for DFE/OCT, possible injxns  Patient tolerated the procedure well and received written and verbal post-procedure care information/education.           ASSESSMENT/PLAN:    ICD-10-CM   1. Moderate nonproliferative diabetic retinopathy of both eyes with macular edema associated with type 2 diabetes mellitus ([name])  E11.3313 OCT, Retina - OU - Both Eyes    Focal Laser - OD - Right Eye    2. Current use of insulin  ([name])  Z79.4     3. Long-term (current) use of injectable non-insulin  antidiabetic drugs  Z79.85     4. Essential hypertension  I10     5. Hypertensive retinopathy of both eyes  H35.033     6. Pseudophakia, both eyes  Z96.1      1-3. Moderate non-proliferative diabetic retinopathy, both  eyes  - A1c was 6.7 (06.06.25), 6.5 (02.17.25) - s/p IVA OD #1 (11.14.22), #2 (12.12.22), #3 (01.09.23), #4 (06.23.25), #5 (08.04.25), #6(09.02.25), #7 (09.29.25), #8 (10.27.25) - s/p IVA OS #1 (10.17.22), #2 (11.14.22), #3 (12.12.22), #4 (01.09.23), #5 (03.15.23), #6 (04.12.23), #7 (08.04.25), #8 (09.02.25), #9 (09.29.25), #10 (10.27.25) ================== - s/p IVE OD #1 (03.15.23), #2 (04.12.23), #3 (05.10.23), #4 (06.07.23), #5 (07.05.23), #6 (08.02.23), #7 (08.31.23), #8 (09.28.23), #9 (10.30.23), #10 (11.28.23), #11 (12.28.23), #12 (01.29.24), #13 (02.20.24), #14 (03.25.24), #15 (04.22.24), #16 (05.20.24), #17 (06.17.24), #18 (07.22.24), #19  (08.26.24), #20 (09.30.24), #21 (11.04.24), #22 (12.16.24) - s/p IVE OS #1 (05.10.23), #2 (06.07.23), #3 (07.05.23), #4 (08.02.23), #5 (08.31.23), #6 (09.28.23), #7 (10.30.23), #8 (11.28.23), #9 (12.28.23), #10 (01.29.24), #11 (02.20..24), #12 (03.25.24), #13 (04.22.24), #14 (05.20.24), #15 (06.17.24), #16 (07.22.24), #17 (09.30.24), #18 (11.04.24), #19 (12.16.24) ==================== - s/p focal laser OS (06.25.24) - s/p focal laser OD (01.13.25) - exam shows scattered MA, DBH OU - FA (10.17.22) shows late leaking MA OU, no NV OU - repeat FA 10.27.25) shows OD: Scattered late leaking MA greatest inferior to fovea, No NV, OS: Scattered late leaking MA greatest ST to fovea, No NV - OCT shows OD: Mild interval increase in IRF/cystic changes inferior fovea and mac; OS: Persistent cystic changes IN and temporal macula- slightly increased at 4 wks - BCVA OD 20/25 - stable; OS stable at 20/20 - recommend focal laser OD today, 11.12.25 with follow up in 2 weeks for IVA OU - pt wishes to proceed with focal laser - RBA of procedure discussed, questions answered - informed consent obtained and signed - see procedure note  - IVA informed consent obtained and signed, 06.23.25 - 2 weeks - DFE, OCT, likely IVA OU  4,5. Hypertensive retinopathy OU - discussed importance of tight BP control  - monitor  6. Pseudophakia OU  - s/p CE/IOL OU  - IOL in good position, doing well  - monitor  Ophthalmic Meds Ordered this visit:  Meds ordered this encounter  Medications   prednisoLONE  acetate (PRED FORTE ) 1 % ophthalmic suspension    Sig: Place 1 drop into the right eye 4 (four) times daily for 7 days.    Dispense:  5 mL    Refill:  0     Return in about 2 weeks (around 05/27/2024) for as scheduled - DME follow up, Dilated Exam, Possible Injxn.  There are no Patient Instructions on file for this visit.  Explained the diagnoses, plan, and follow up with the patient and they expressed understanding.   Patient expressed understanding of the importance of proper follow up care.   This document serves as a record of services personally performed by Redell JUDITHANN Hans, MD, PhD. It was created on their behalf by Almetta Pesa, an ophthalmic technician. The creation of this record is the provider's dictation and/or activities during the visit.    Electronically signed by: Almetta Pesa, OA, 05/13/24  9:14 PM  This document serves as a record of services personally performed by Redell JUDITHANN Hans, MD, PhD. It was created on their behalf by Wanda GEANNIE Keens, COT an ophthalmic technician. The creation of this record is the provider's dictation and/or activities during the visit.    Electronically signed by:  Wanda GEANNIE Keens, COT  05/13/24 9:14 PM  Redell JUDITHANN Hans, M.D., Ph.D. Diseases & Surgery of the Retina and Vitreous [facility]  I have reviewed the above documentation for accuracy and completeness, and I agree with  the above. Redell JUDITHANN Hans, M.D., Ph.D. 05/13/24 9:17 PM    Abbreviations: M myopia (nearsighted); A astigmatism; H hyperopia (farsighted); P presbyopia; Mrx spectacle prescription;  CTL contact lenses; OD right eye; OS left eye; OU both eyes  XT exotropia; ET esotropia; PEK punctate epithelial keratitis; PEE punctate epithelial erosions; DES dry eye syndrome; MGD meibomian gland dysfunction; ATs artificial tears; PFAT's preservative free artificial tears; NSC nuclear sclerotic cataract; PSC posterior subcapsular cataract; ERM epi-retinal membrane; PVD posterior vitreous detachment; RD retinal detachment; DM diabetes mellitus; DR diabetic retinopathy; NPDR non-proliferative diabetic retinopathy; PDR proliferative diabetic retinopathy; CSME clinically significant macular edema; DME diabetic macular edema; dbh dot blot hemorrhages; CWS cotton wool spot; POAG primary open angle glaucoma; C/D cup-to-disc ratio; HVF humphrey visual field; GVF goldmann visual  field; OCT optical coherence tomography; IOP intraocular pressure; BRVO Branch retinal vein occlusion; CRVO central retinal vein occlusion; CRAO central retinal artery occlusion; BRAO branch retinal artery occlusion; RT retinal tear; SB scleral buckle; PPV pars plana vitrectomy; VH Vitreous hemorrhage; PRP panretinal laser photocoagulation; IVK intravitreal kenalog; VMT vitreomacular traction; MH Macular hole;  NVD neovascularization of the disc; NVE neovascularization elsewhere; AREDS age related eye disease study; ARMD age related macular degeneration; POAG primary open angle glaucoma; EBMD epithelial/anterior basement membrane dystrophy; ACIOL anterior chamber intraocular lens; IOL intraocular lens; PCIOL posterior chamber intraocular lens; Phaco/IOL phacoemulsification with intraocular lens placement; PRK photorefractive keratectomy; LASIK laser assisted in situ keratomileusis; HTN hypertension; DM diabetes mellitus; COPD chronic obstructive pulmonary disease

## 2024-05-11 ENCOUNTER — Other Ambulatory Visit (HOSPITAL_BASED_OUTPATIENT_CLINIC_OR_DEPARTMENT_OTHER): Payer: Self-pay

## 2024-05-11 ENCOUNTER — Other Ambulatory Visit (HOSPITAL_COMMUNITY): Payer: Self-pay

## 2024-05-11 ENCOUNTER — Ambulatory Visit (INDEPENDENT_AMBULATORY_CARE_PROVIDER_SITE_OTHER): Admitting: Internal Medicine

## 2024-05-11 ENCOUNTER — Encounter: Payer: Self-pay | Admitting: Internal Medicine

## 2024-05-11 ENCOUNTER — Other Ambulatory Visit: Payer: Self-pay

## 2024-05-11 VITALS — BP 128/68 | HR 68 | Temp 97.6°F | Resp 16 | Ht 65.0 in | Wt 170.0 lb

## 2024-05-11 DIAGNOSIS — K8681 Exocrine pancreatic insufficiency: Secondary | ICD-10-CM | POA: Diagnosis not present

## 2024-05-11 DIAGNOSIS — J22 Unspecified acute lower respiratory infection: Secondary | ICD-10-CM | POA: Insufficient documentation

## 2024-05-11 DIAGNOSIS — I10 Essential (primary) hypertension: Secondary | ICD-10-CM | POA: Diagnosis not present

## 2024-05-11 MED ORDER — PANCRELIPASE (LIP-PROT-AMYL) 36000-114000 UNITS PO CPEP
36000.0000 [IU] | ORAL_CAPSULE | Freq: Three times a day (TID) | ORAL | 0 refills | Status: DC
  Filled 2024-05-11: qty 200, 67d supply, fill #0
  Filled 2024-07-12: qty 200, 67d supply, fill #1

## 2024-05-11 MED ORDER — CEFPODOXIME PROXETIL 200 MG PO TABS
200.0000 mg | ORAL_TABLET | Freq: Two times a day (BID) | ORAL | 0 refills | Status: AC
  Filled 2024-05-11: qty 14, 7d supply, fill #0

## 2024-05-11 NOTE — Progress Notes (Signed)
 "  Subjective:  Patient ID: William [name], male    DOB: 07/02/1951  Age: 73 y.o. MRN: [id number]  CC: Abdominal Pain   HPI William [name] presents for f/up ---    Discussed the use of AI scribe software for clinical note transcription with the patient, who gave verbal consent to proceed.  History of Present Illness William [name] is a 73 year old male who presents with abdominal pain and diarrhea.  He experiences severe abdominal pain localized to the left lower side, which does not radiate. The pain intensifies after bowel movements, making it difficult for him to walk. He also notes abdominal distension this morning. He experiences nausea but is unable to vomit.  He reports diarrhea that comes and goes and occurs after eating. He describes needing to strain to defecate, followed by persistent diarrhea throughout the day, which he describes as foul-smelling and 'rotten.' No blood is observed in the stool, but its runny nature makes it difficult to assess. He uses Imodium to manage the diarrhea and avoids eating as it triggers the need to use the bathroom, affecting his sleep at night.  He has a worsening cough and uses DM medication, which is ineffective. He occasionally coughs up phlegm. No hemoptysis is noted.     Outpatient Medications Prior to Visit  Medication Sig Dispense Refill   acetaminophen  (TYLENOL ) 500 MG tablet Take 500 mg by mouth every 6 (six) hours as needed for moderate pain.     aspirin  EC 81 MG tablet Take 1 tablet (81 mg total) by mouth daily. Swallow whole.     Blood Glucose Monitoring Suppl DEVI Use to test blood sugar morning, at noon, and at bedtime. 1 each 1   carvedilol  (COREG ) 3.125 MG tablet Take 1 tablet (3.125 mg total) by mouth 2 (two) times daily with a meal. 180 tablet 1   Continuous Glucose Receiver (DEXCOM G7 RECEIVER) DEVI Use to monitor blood sugar. 9 each 1   Continuous Glucose Sensor (DEXCOM G7 SENSOR) MISC INJECT 1 SENSOR EVERY 10 DAYS TO MONITOR BLOOD  SUGAR 9 each 1   dapagliflozin  propanediol (FARXIGA ) 10 MG TABS tablet Take 1 tablet (10 mg total) by mouth daily. 90 tablet 1   gabapentin  (NEURONTIN ) 300 MG capsule Take 1 capsule (300 mg total) by mouth 2 (two) times daily. 180 capsule 0   glucose blood (ACCU-CHEK GUIDE TEST) test strip USE TO CHECK BLOOD SUGAR IN THE AM, AT NOON AND AT BEDTIME 100 strip 5   icosapent  Ethyl (VASCEPA ) 1 g capsule Take 2 capsules (2 g total) by mouth 2 (two) times daily. 360 capsule 1   insulin  glargine (LANTUS  SOLOSTAR) 100 UNIT/ML Solostar Pen ADMINISTER 40 UNITS UNDER THE SKIN AT BEDTIME 36 mL 1   Insulin  Glargine Solostar (LANTUS ) 100 UNIT/ML Solostar Pen Inject 55 Units into the skin every evening. 45 mL 0   Insulin  Pen Needle 31G X 8 MM MISC Use once daily as directed. 100 each 2   Insulin  Pen Needle 31G X 8 MM MISC Use to inject insulin  four times daily. 300 each 2   Lancets Misc. (ACCU-CHEK FASTCLIX LANCET) KIT Use in the morning, noon and at bedtime. 1 kit 5   OZEMPIC , 0.25 OR 0.5 MG/DOSE, 2 MG/3ML SOPN Inject 0.25 mg into the skin once a week.     pantoprazole  (PROTONIX ) 40 MG tablet Take 1 tablet (40 mg total) by mouth 2 (two) times daily before a meal. 180 tablet 1   pramipexole  (MIRAPEX ) 0.125 MG  tablet Take 1 tablet (0.125 mg total) by mouth at bedtime. 90 tablet 0   rosuvastatin  (CRESTOR ) 40 MG tablet Take 1 tablet (40 mg total) by mouth daily. 90 tablet 1   torsemide  (DEMADEX ) 20 MG tablet Take 1 tablet (20 mg total) by mouth 3 (three) times a week. 36 tablet 1   warfarin (COUMADIN ) 5 MG tablet TAKE 1 TO 1 AND 1/2 TABLETS BY MOUTH DAILY AS DIRECTED BY COUMADIN  CLINIC 120 tablet 1   No facility-administered medications prior to visit.    ROS Review of Systems  Constitutional:  Negative for appetite change, chills, diaphoresis, fatigue and unexpected weight change.  HENT: Negative.    Eyes: Negative.  Negative for visual disturbance.  Respiratory:  Positive for cough. Negative for chest  tightness, shortness of breath and stridor.   Cardiovascular:  Negative for chest pain, palpitations and leg swelling.  Gastrointestinal:  Positive for abdominal pain, diarrhea and nausea. Negative for blood in stool, constipation and vomiting.  Endocrine: Negative.   Genitourinary: Negative.  Negative for difficulty urinating, dysuria and hematuria.  Musculoskeletal:  Positive for back pain and gait problem.  Skin: Negative.   Psychiatric/Behavioral:  Positive for confusion and decreased concentration. Negative for behavioral problems, dysphoric mood, sleep disturbance and suicidal ideas.     Objective:  BP 128/68 (BP Location: Right Arm, Patient Position: Sitting, Cuff Size: Normal)   Pulse 68   Temp 97.6 F (36.4 C) (Oral)   Resp 16   Ht 5' 5 (1.651 m)   Wt 170 lb (77.1 kg)   SpO2 92%   BMI 28.29 kg/m   BP Readings from Last 3 Encounters:  05/12/24 (!) 104/50  05/11/24 128/68  04/29/24 132/78    Wt Readings from Last 3 Encounters:  05/12/24 171 lb 4.8 oz (77.7 kg)  05/11/24 170 lb (77.1 kg)  04/29/24 167 lb (75.8 kg)    Physical Exam Vitals reviewed.  Constitutional:      General: He is not in acute distress.    Appearance: He is ill-appearing. He is not toxic-appearing or diaphoretic.  HENT:     Nose: Nose normal.     Mouth/Throat:     Mouth: Mucous membranes are moist.  Eyes:     General: No scleral icterus.    Conjunctiva/sclera: Conjunctivae normal.  Cardiovascular:     Rate and Rhythm: Normal rate and regular rhythm.     Heart sounds: No murmur heard.    No gallop.  Pulmonary:     Effort: Pulmonary effort is normal.     Breath sounds: No stridor. Examination of the right-middle field reveals rhonchi. Examination of the right-lower field reveals rhonchi and rales. Rhonchi and rales present. No decreased breath sounds or wheezing.  Chest:     Chest wall: No tenderness.  Abdominal:     General: Abdomen is protuberant. Bowel sounds are normal. There is no  distension.     Palpations: Abdomen is soft. There is no hepatomegaly, splenomegaly or mass.     Tenderness: There is no abdominal tenderness. There is no guarding or rebound.     Hernia: There is no hernia in the left inguinal area or right inguinal area.  Genitourinary:    Pubic Area: No rash.      Penis: Normal.      Testes: Normal.        Right: Mass, tenderness or swelling not present. Right testis is descended.        Left: Mass, tenderness or swelling not  present. Left testis is descended.     Epididymis:     Right: Normal. Not inflamed or enlarged. No mass or tenderness.     Left: Normal. Not inflamed or enlarged. No mass or tenderness.     Prostate: Enlarged. Not tender and no nodules present.     Rectum: Normal. Guaiac result negative. No mass, tenderness, anal fissure, external hemorrhoid or internal hemorrhoid. Normal anal tone.  Musculoskeletal:        General: Normal range of motion.     Cervical back: Neck supple.  Lymphadenopathy:     Cervical: No cervical adenopathy.     Lower Body: No right inguinal adenopathy. No left inguinal adenopathy.  Skin:    Coloration: Skin is not jaundiced.  Neurological:     Mental Status: He is alert.     Lab Results  Component Value Date   WBC 6.1 04/29/2024   HGB 14.4 04/29/2024   HCT 42.0 04/29/2024   PLT 180.0 04/29/2024   GLUCOSE 134 (H) 04/29/2024   CHOL 106 08/19/2023   TRIG 314.0 (H) 04/29/2024   HDL 26.10 (L) 08/19/2023   LDLDIRECT 54.0 11/05/2022   LDLCALC 20 08/19/2023   ALT 68 (H) 04/29/2024   AST 60 (H) 04/29/2024   NA 139 04/29/2024   K 4.0 04/29/2024   CL 101 04/29/2024   CREATININE 1.58 (H) 04/29/2024   BUN 37 (H) 04/29/2024   CO2 26 04/29/2024   TSH 0.655 10/01/2023   PSA 3.50 05/07/2022   INR 2.2 04/27/2024   HGBA1C 6.8 (H) 04/29/2024   MICROALBUR 3.1 (H) 01/14/2024    MR Abdomen W Wo Contrast Result Date: 05/04/2024 CLINICAL DATA:  Abdominal pain, acute, nonlocalized EXAM: MRI ABDOMEN WITHOUT AND  WITH CONTRAST TECHNIQUE: Multiplanar multisequence MR imaging of the abdomen was performed both before and after the administration of intravenous contrast. CONTRAST:  7.5 mL of Vueway . COMPARISON:  CT scan abdomen and pelvis from 12/06/2023. FINDINGS: Lower chest: Unremarkable MR appearance to the lung bases. No pleural effusion. No pericardial effusion. Normal heart size. Hepatobiliary: The liver is mildly enlarged in size measuring up to 15.6 cm in length. No suspicious liver lesion. There is a 2-3 mm focus of arterial hyperenhancement in the right hepatic lobe, segment 8 which is not seen on any other sequences and may represent small arterial portal shunt. No intrahepatic or extrahepatic bile duct dilatation. No choledocholithiasis. Contracted gallbladder containing multiple gallstones. No imaging evidence of acute cholecystitis. Pancreas: No mass, inflammatory changes or other parenchymal abnormality identified. No main pancreatic duct dilation. Spleen: Top-normal in size measuring up to 12.8 cm in length. No focal mass. Adrenals/Urinary Tract: Unremarkable adrenal glands. No hydroureteronephrosis. There are at least 2 simple cortical cysts in the left kidney with largest arising from the upper pole, anterolaterally measuring up to 2.2 x 2.4 cm. No suspicious renal lesion. Stomach/Bowel: Visualized portions within the abdomen are unremarkable. No disproportionate dilation of bowel loops. Vascular/Lymphatic: No pathologically enlarged lymph nodes identified. No abdominal aortic aneurysm demonstrated. No ascites. Other:  None. Musculoskeletal: No suspicious bone lesions identified. IMPRESSION: 1. Cholelithiasis without imaging signs of acute cholecystitis. No intra or extrahepatic bile duct dilation. No choledocholithiasis. 2. Multiple other nonacute observations, as described above. Electronically Signed   By: Ree Molt M.D.   On: 05/04/2024 16:02    MR Abdomen W Wo Contrast Result Date:  05/04/2024 CLINICAL DATA:  Abdominal pain, acute, nonlocalized EXAM: MRI ABDOMEN WITHOUT AND WITH CONTRAST TECHNIQUE: Multiplanar multisequence MR imaging of the abdomen  was performed both before and after the administration of intravenous contrast. CONTRAST:  7.5 mL of Vueway . COMPARISON:  CT scan abdomen and pelvis from 12/06/2023. FINDINGS: Lower chest: Unremarkable MR appearance to the lung bases. No pleural effusion. No pericardial effusion. Normal heart size. Hepatobiliary: The liver is mildly enlarged in size measuring up to 15.6 cm in length. No suspicious liver lesion. There is a 2-3 mm focus of arterial hyperenhancement in the right hepatic lobe, segment 8 which is not seen on any other sequences and may represent small arterial portal shunt. No intrahepatic or extrahepatic bile duct dilatation. No choledocholithiasis. Contracted gallbladder containing multiple gallstones. No imaging evidence of acute cholecystitis. Pancreas: No mass, inflammatory changes or other parenchymal abnormality identified. No main pancreatic duct dilation. Spleen: Top-normal in size measuring up to 12.8 cm in length. No focal mass. Adrenals/Urinary Tract: Unremarkable adrenal glands. No hydroureteronephrosis. There are at least 2 simple cortical cysts in the left kidney with largest arising from the upper pole, anterolaterally measuring up to 2.2 x 2.4 cm. No suspicious renal lesion. Stomach/Bowel: Visualized portions within the abdomen are unremarkable. No disproportionate dilation of bowel loops. Vascular/Lymphatic: No pathologically enlarged lymph nodes identified. No abdominal aortic aneurysm demonstrated. No ascites. Other:  None. Musculoskeletal: No suspicious bone lesions identified. IMPRESSION: 1. Cholelithiasis without imaging signs of acute cholecystitis. No intra or extrahepatic bile duct dilation. No choledocholithiasis. 2. Multiple other nonacute observations, as described above. Electronically Signed   By: Ree Molt M.D.   On: 05/04/2024 16:02   Intravitreal Injection, Pharmacologic Agent - OD - Right Eye Result Date: 04/30/2024 Time Out 04/27/2024. 9:07 AM. Confirmed correct patient, procedure, site, and patient consented. Anesthesia Topical anesthesia was used. Anesthetic medications included Lidocaine  2%, Proparacaine 0.5%. Procedure Preparation included 5% betadine  to ocular surface, eyelid speculum. A supplied (32g) needle was used. Injection: 1.25 mg Bevacizumab  1.25mg /0.83ml   Route: Intravitreal, Site: Right Eye   NDC: C2662926, Lot: [id number], Expiration date: 05/17/2024 Post-op Post injection exam found visual acuity of at least counting fingers. The patient tolerated the procedure well. There were no complications. The patient received written and verbal post procedure care education. Post injection medications were not given.   Intravitreal Injection, Pharmacologic Agent - OS - Left Eye Result Date: 04/30/2024 Time Out 04/27/2024. 9:07 AM. Confirmed correct patient, procedure, site, and patient consented. Anesthesia Topical anesthesia was used. Anesthetic medications included Lidocaine  2%, Proparacaine 0.5%. Procedure Preparation included 5% betadine  to ocular surface, eyelid speculum. A (32g) needle was used. Injection: 1.25 mg Bevacizumab  1.25mg /0.26ml   Route: Intravitreal, Site: Left Eye   NDC: C2662926, Lot: [id number], Expiration date: 07/11/2024 Post-op Post injection exam found visual acuity of at least counting fingers. The patient tolerated the procedure well. There were no complications. The patient received written and verbal post procedure care education. Post injection medications were not given.   Fluorescein  Angiography Optos (Transit OD) Result Date: 04/30/2024 Right Eye Progression has been stable. Early phase findings include microaneurysm. Mid/Late phase findings include leakage, microaneurysm (Scattered late leaking MA greatest inferior to fovea, No NV). Left Eye Progression  has been stable. Early phase findings include microaneurysm. Mid/Late phase findings include leakage, microaneurysm (Scattered late leaking MA greatest ST to fovea, No NV). Notes **Images stored on drive** Impression: Moderate NPDR OU Late leaking MA OU No NV OU OD: Scattered late leaking MA greatest inferior to fovea, No NV OS: Scattered late leaking MA greatest ST to fovea, No NV   OCT, Retina - OU - Both Eyes Result  Date: 04/30/2024 Right Eye Quality was good. Central Foveal Thickness: 274. Progression has improved. Findings include no SRF, abnormal foveal contour, intraretinal hyper-reflective material, intraretinal fluid, vitreomacular adhesion (Mild interval improvement in IRF/cystic changes inferior fovea and mac ). Left Eye Quality was good. Central Foveal Thickness: 264. Progression has worsened. Findings include normal foveal contour, no SRF, intraretinal hyper-reflective material, intraretinal fluid (Persistent cystic changes IN and temporal macula-- slightly increased). Notes *Images captured and stored on drive Diagnosis / Impression: +DME OU OD: mild interval improvement in IRF/cystic changes inferior fovea and mac OS: Persistent cystic changes IN and temporal macula -- slightly increased Clinical management: See below Abbreviations: NFP - Normal foveal profile. CME - cystoid macular edema. PED - pigment epithelial detachment. IRF - intraretinal fluid. SRF - subretinal fluid. EZ - ellipsoid zone. ERM - epiretinal membrane. ORA - outer retinal atrophy. ORT - outer retinal tubulation. SRHM - subretinal hyper-reflective material. IRHM - intraretinal hyper-reflective material   DG ABD ACUTE 2+V W 1V CHEST Result Date: 04/29/2024 CLINICAL DATA:  Abdomen pain cough EXAM: DG ABDOMEN ACUTE WITH 1 VIEW CHEST COMPARISON:  01/14/2024 FINDINGS: Single view chest demonstrates sternotomy. Stable cardiomediastinal silhouette with aortic atherosclerosis. No focal opacity. Supine and upright views of the  abdomen demonstrate no free air beneath the diaphragm. There is a nonobstructed gas pattern. Vascular calcifications. Average stool burden IMPRESSION: No active cardiopulmonary disease. Nonobstructed gas pattern. Electronically Signed   By: Luke Bun M.D.   On: 04/29/2024 15:44   Intravitreal Injection, Pharmacologic Agent - OD - Right Eye Result Date: 03/30/2024 Time Out 03/30/2024. 8:39 AM. Confirmed correct patient, procedure, site, and patient consented. Anesthesia Topical anesthesia was used. Anesthetic medications included Lidocaine  2%, Proparacaine 0.5%. Procedure Preparation included 5% betadine  to ocular surface, eyelid speculum. A (32g) needle was used. Injection: 1.25 mg Bevacizumab  1.25mg /0.26ml   Route: Intravitreal, Site: Right Eye   NDC: H525437, Lot: [id number], Expiration date: 07/11/2024 Post-op Post injection exam found visual acuity of at least counting fingers. The patient tolerated the procedure well. There were no complications. The patient received written and verbal post procedure care education. Post injection medications were not given.   Intravitreal Injection, Pharmacologic Agent - OS - Left Eye Result Date: 03/30/2024 Time Out 03/30/2024. 8:42 AM. Confirmed correct patient, procedure, site, and patient consented. Anesthesia Topical anesthesia was used. Anesthetic medications included Lidocaine  2%, Proparacaine 0.5%. Procedure Preparation included 5% betadine  to ocular surface, eyelid speculum. A (32g) needle was used. Injection: 1.25 mg Bevacizumab  1.25mg /0.38ml   Route: Intravitreal, Site: Left Eye   NDC: H525437, Lot: [id number], Expiration date: 05/28/2024 Post-op Post injection exam found visual acuity of at least counting fingers. The patient tolerated the procedure well. There were no complications. The patient received written and verbal post procedure care education. Post injection medications were not given.   OCT, Retina - OU - Both Eyes Result Date:  03/30/2024 Right Eye Quality was good. Central Foveal Thickness: 272. Progression has improved. Findings include no SRF, abnormal foveal contour, intraretinal hyper-reflective material, intraretinal fluid, vitreomacular adhesion (Mild interval improvement in IRF/cystic changes inferior fovea and mac ). Left Eye Quality was good. Central Foveal Thickness: 258. Progression has been stable. Findings include normal foveal contour, no SRF, intraretinal hyper-reflective material, intraretinal fluid (Persistent cystic changes IN and temporal macula). Notes *Images captured and stored on drive Diagnosis / Impression: +DME OU OD: mild interval improvement in IRF/cystic changes inferior fovea and mac OS: Persistent cystic changes IN and temporal macula Clinical management: See below Abbreviations: NFP -  Normal foveal profile. CME - cystoid macular edema. PED - pigment epithelial detachment. IRF - intraretinal fluid. SRF - subretinal fluid. EZ - ellipsoid zone. ERM - epiretinal membrane. ORA - outer retinal atrophy. ORT - outer retinal tubulation. SRHM - subretinal hyper-reflective material. IRHM - intraretinal hyper-reflective material   Intravitreal Injection, Pharmacologic Agent - OD - Right Eye Result Date: 03/03/2024 Time Out 03/03/2024. 8:56 AM. Confirmed correct patient, procedure, site, and patient consented. Anesthesia Topical anesthesia was used. Anesthetic medications included Lidocaine  2%, Proparacaine 0.5%. Procedure Preparation included 5% betadine  to ocular surface, eyelid speculum. A (32g) needle was used. Injection: 1.25 mg Bevacizumab  1.25mg /0.63ml   Route: Intravitreal, Site: Right Eye   NDC: C2662926, Lot: [id number], Expiration date: 05/28/2024 Post-op Post injection exam found visual acuity of at least counting fingers. The patient tolerated the procedure well. There were no complications. The patient received written and verbal post procedure care education. Post injection medications were not given.    Intravitreal Injection, Pharmacologic Agent - OS - Left Eye Result Date: 03/03/2024 Time Out 03/03/2024. 8:57 AM. Confirmed correct patient, procedure, site, and patient consented. Anesthesia Topical anesthesia was used. Anesthetic medications included Lidocaine  2%, Proparacaine 0.5%. Procedure Preparation included 5% betadine  to ocular surface, eyelid speculum. A supplied (32g) needle was used. Injection: 1.25 mg Bevacizumab  1.25mg /0.43ml   Route: Intravitreal, Site: Left Eye   NDC: 49757-939-98, Lot: [id number], Expiration date: 03/22/2024 Post-op Post injection exam found visual acuity of at least counting fingers. The patient tolerated the procedure well. There were no complications. The patient received written and verbal post procedure care education. Post injection medications were not given.   OCT, Retina - OU - Both Eyes Result Date: 03/03/2024 Right Eye Quality was good. Central Foveal Thickness: 293. Progression has improved. Findings include no SRF, abnormal foveal contour, intraretinal hyper-reflective material, intraretinal fluid, vitreomacular adhesion (Mild interval improvement in IRF/cystic changes inferior fovea and mac ). Left Eye Quality was good. Central Foveal Thickness: 260. Progression has improved. Findings include normal foveal contour, no SRF, intraretinal hyper-reflective material, intraretinal fluid (Mild interval improvement in cystic changes IN and temporal macula). Notes *Images captured and stored on drive Diagnosis / Impression: +DME OU OD: mild interval improvement in IRF/cystic changes inferior fovea and mac OS: Mild interval improvement in cystic changes IN and temporal macula Clinical management: See below Abbreviations: NFP - Normal foveal profile. CME - cystoid macular edema. PED - pigment epithelial detachment. IRF - intraretinal fluid. SRF - subretinal fluid. EZ - ellipsoid zone. ERM - epiretinal membrane. ORA - outer retinal atrophy. ORT - outer retinal tubulation. SRHM -  subretinal hyper-reflective material. IRHM - intraretinal hyper-reflective material   VAS US  LOWER EXTREMITY ARTERIAL DUPLEX Result Date: 02/17/2024 LOWER EXTREMITY ARTERIAL DUPLEX STUDY Patient Name:  William [name]  Date of Exam:   02/17/2024 Medical Rec #: [id number]    Accession #:    [id number] Date of Birth: 03-10-1951   Patient Gender: M Patient Age:   82 years Exam Location:  [address] Procedure:      VAS US  LOWER EXTREMITY ARTERIAL DUPLEX Referring Phys: [facility] ARIDA --------------------------------------------------------------------------------  Indications: Peripheral artery disease. High Risk Factors: Hypertension, hyperlipidemia, coronary artery disease.  Vascular Interventions: Successful drug-coated balloon angioplasty to the left                         SFA on 07/17/2023. Successful angioplasty followed by  drug-coated balloon angioplasty of the right distal SFA                         on 05/02/2016. Current ABI:            R 0.75 L 0.86 Comparison Study: 08/06/23 Performing Technologist: Duwaine Hives RVS  Examination Guidelines: A complete evaluation includes B-mode imaging, spectral Doppler, color Doppler, and power Doppler as needed of all accessible portions of each vessel. Bilateral testing is considered an integral part of a complete examination. Limited examinations for reoccurring indications may be performed as noted.  +-----------+--------+-----+---------------+----------+--------+ RIGHT      PSV cm/sRatioStenosis       Waveform  Comments +-----------+--------+-----+---------------+----------+--------+ CFA Distal 187          30-49% stenosisbiphasic           +-----------+--------+-----+---------------+----------+--------+ DFA        144                         biphasic           +-----------+--------+-----+---------------+----------+--------+ SFA Prox   351          50-74% stenosisbiphasic            +-----------+--------+-----+---------------+----------+--------+ SFA Mid    223          50-74% stenosismonophasic         +-----------+--------+-----+---------------+----------+--------+ SFA Distal 215          50-74% stenosismonophasic         +-----------+--------+-----+---------------+----------+--------+ POP Prox   127                         triphasic          +-----------+--------+-----+---------------+----------+--------+ POP Distal 92                          monophasic         +-----------+--------+-----+---------------+----------+--------+ TP Trunk   98                          monophasic         +-----------+--------+-----+---------------+----------+--------+ ATA Distal 35                          monophasic         +-----------+--------+-----+---------------+----------+--------+ PTA Distal 25                          monophasic         +-----------+--------+-----+---------------+----------+--------+ PERO Distal79                          monophasic         +-----------+--------+-----+---------------+----------+--------+  +-----------+--------+-----+---------------+----------+--------+ LEFT       PSV cm/sRatioStenosis       Waveform  Comments +-----------+--------+-----+---------------+----------+--------+ CFA Distal 213          50-74% stenosisbiphasic           +-----------+--------+-----+---------------+----------+--------+ DFA        226          50-74% stenosisbiphasic           +-----------+--------+-----+---------------+----------+--------+ SFA Prox   286          50-74% stenosisbiphasic           +-----------+--------+-----+---------------+----------+--------+  SFA Mid    188          30-49% stenosisbiphasic           +-----------+--------+-----+---------------+----------+--------+ SFA Distal 220          50-74% stenosisbiphasic            +-----------+--------+-----+---------------+----------+--------+ POP Prox   160          30-49% stenosisbiphasic           +-----------+--------+-----+---------------+----------+--------+ POP Distal 180          30-49% stenosistriphasic          +-----------+--------+-----+---------------+----------+--------+ TP Trunk   161          30-49% stenosistriphasic          +-----------+--------+-----+---------------+----------+--------+ ATA Distal 69                          monophasic         +-----------+--------+-----+---------------+----------+--------+ PTA Distal 14                          monophasic         +-----------+--------+-----+---------------+----------+--------+ PERO Distal94                          monophasic         +-----------+--------+-----+---------------+----------+--------+  Summary: Right: 30-49% stenosis noted in the common femoral artery. 50-74% stenosis noted in the superficial femoral artery. Left: 50-74% stenosis noted in the common femoral artery. 50-74% stenosis noted in the deep femoral artery. 50-74% stenosis noted in the superficial femoral artery. 30-49% stenosis noted in the popliteal artery.  See table(s) above for measurements and observations. Suggest follow up study in 6 months. Electronically signed by Evalene Lunger MD on 02/17/2024 at 10:09:05 PM.    Final    VAS US  ABI WITH/WO TBI Result Date: 02/17/2024  LOWER EXTREMITY DOPPLER STUDY Patient Name:  William [name]  Date of Exam:   02/17/2024 Medical Rec #: [id number]    Accession #:    [id number] Date of Birth: Sep 09, 1950   Patient Gender: M Patient Age:   67 years Exam Location:  [address] Procedure:      VAS US  ABI WITH/WO TBI Referring Phys: [facility] ARIDA --------------------------------------------------------------------------------  Indications: Peripheral artery disease. High Risk Factors: Hypertension, hyperlipidemia, coronary artery disease.  Vascular Interventions:  Successful drug-coated balloon angioplasty to the left                         SFA on 07/17/2023. Successful angioplasty followed by                         drug-coated balloon angioplasty of the right distal SFA                         on 05/02/2016. Comparison Study: 08/06/23 Performing Technologist: Duwaine Hives RVS  Examination Guidelines: A complete evaluation includes at minimum, Doppler waveform signals and systolic blood pressure reading at the level of bilateral brachial, anterior tibial, and posterior tibial arteries, when vessel segments are accessible. Bilateral testing is considered an integral part of a complete examination. Photoelectric Plethysmograph (PPG) waveforms and toe systolic pressure readings are included as required and additional duplex testing as needed. Limited examinations for reoccurring indications may be performed as noted.  ABI Findings: +---------+------------------+-----+----------+----------+  Right    Rt Pressure (mmHg)IndexWaveform  Comment    +---------+------------------+-----+----------+----------+ Brachial 108                                         +---------+------------------+-----+----------+----------+ PTA      83                0.67 monophasic           +---------+------------------+-----+----------+----------+ DP       93                0.75 monophasic           +---------+------------------+-----+----------+----------+ Great Toe                                 amputation +---------+------------------+-----+----------+----------+ +---------+------------------+-----+----------+-------+ Left     Lt Pressure (mmHg)IndexWaveform  Comment +---------+------------------+-----+----------+-------+ Brachial 124                                      +---------+------------------+-----+----------+-------+ PTA      107               0.86 monophasic        +---------+------------------+-----+----------+-------+ DP       105                0.85 monophasic        +---------+------------------+-----+----------+-------+ Great Toe64                0.52                   +---------+------------------+-----+----------+-------+ +-------+-----------+-----------+------------+------------+ ABI/TBIToday's ABIToday's TBIPrevious ABIPrevious TBI +-------+-----------+-----------+------------+------------+ Right  0.75       amputation 1.01        amputation   +-------+-----------+-----------+------------+------------+ Left   0.86       0.52       0.97        0.35         +-------+-----------+-----------+------------+------------+ Bilateral ABIs appear decreased compared to prior study on 08/06/23.  Summary: Right: Resting right ankle-brachial index indicates moderate right lower extremity arterial disease. Left: Resting left ankle-brachial index indicates mild left lower extremity arterial disease. The left toe-brachial index is abnormal. *See table(s) above for measurements and observations.  Suggest follow up study in 6 months. Electronically signed by Evalene Lunger MD on 02/17/2024 at 9:59:21 PM.    Final      Assessment & Plan:  LRTI (lower respiratory tract infection) -     Cefpodoxime  Proxetil; Take 1 tablet (200 mg total) by mouth 2 (two) times daily for 7 days.  Dispense: 14 tablet; Refill: 0  Exocrine pancreatic insufficiency -     Pancrelipase  (Lip-Prot-Amyl); Take 1 capsule (36,000 Units total) by mouth 3 (three) times daily before meals.  Dispense: 270 capsule; Refill: 0  Essential hypertension- BP is well controlled.     Follow-up: No follow-ups on file.  Debby Molt, MD "

## 2024-05-12 ENCOUNTER — Other Ambulatory Visit: Payer: Self-pay | Admitting: Internal Medicine

## 2024-05-12 ENCOUNTER — Ambulatory Visit: Attending: Cardiovascular Disease | Admitting: Cardiovascular Disease

## 2024-05-12 ENCOUNTER — Encounter: Payer: Self-pay | Admitting: Cardiovascular Disease

## 2024-05-12 VITALS — BP 104/50 | HR 79 | Ht 65.0 in | Wt 171.3 lb

## 2024-05-12 DIAGNOSIS — I2510 Atherosclerotic heart disease of native coronary artery without angina pectoris: Secondary | ICD-10-CM

## 2024-05-12 DIAGNOSIS — I5022 Chronic systolic (congestive) heart failure: Secondary | ICD-10-CM

## 2024-05-12 DIAGNOSIS — I480 Paroxysmal atrial fibrillation: Secondary | ICD-10-CM | POA: Diagnosis not present

## 2024-05-12 DIAGNOSIS — I739 Peripheral vascular disease, unspecified: Secondary | ICD-10-CM | POA: Diagnosis not present

## 2024-05-12 DIAGNOSIS — G2581 Restless legs syndrome: Secondary | ICD-10-CM

## 2024-05-12 DIAGNOSIS — E785 Hyperlipidemia, unspecified: Secondary | ICD-10-CM

## 2024-05-12 NOTE — Progress Notes (Unsigned)
 Cardiology Office Note   Date:  05/14/2024   ID:  Ed Mandich, DOB [birth year], MRN [id number]  PCP:  Joshua Debby CROME, MD  Cardiologist:   Deatrice Cage, MD   No chief complaint on file.      History of Present Illness: William [name] is a 73 y.o. male who is here today for a follow-up visit regarding peripheral arterial disease, coronary artery disease and paroxysmal atrial fibrillation. He has known history of inferior myocardial infarction complicated by VSD in September, 2017. He is status post one-vessel CABG and VSD repair. He had postoperative atrial fibrillation and has been on anticoagulation.  He is status post right metatarsal amputation. The patient has known history of diabetes.  He is known to have peripheral arterial disease. Angiography in November, 2017 showed no significant aortoiliac disease, significant right distal SFA stenosis and one-vessel runoff below the knee via the peroneal artery with reconstitution of the dorsalis pedis distally. I performed successful drug-coated balloon angioplasty of the right SFA. He is s/p right transmetatarsal amputation for osteomyelitis.    He developed an ulceration on the left foot in 2024.  Angiography was performed in December 2024 which showed significant stenosis in the proximal/mid SFA, moderate popliteal artery stenosis and one-vessel runoff below the knee via the peroneal artery which reconstituted the pedal arch distally.  I performed successful drug-coated balloon angioplasty to the left SFA.  Postprocedure ABI improved to normal but toe pressure was still moderately reduced at 40 mmHg.  Most recent Doppler studies in August showed an ABI of 0.75 on the right and 0.86 on the left.  Great toe pressure on the left was 64.  Duplex showed patent SFA.  Echocardiogram was done in February of this year which showed an EF of 40 to 45% with mild mitral regurgitation.  Both losartan  and small dose carvedilol  were discontinued due  to low blood pressure.  He has been doing reasonably well and denies any recent chest pain.  He has mild exertional dyspnea with no recent worsening.  He continues to take torsemide  3 times a week. He is having issues with abdominal pain and was found to have gallstones.  He might require cholecystectomy.  Currently he has no ulceration on the left foot.  He does have a callus on the right foot but no open wounds.  Past Medical History:  Diagnosis Date   AKI (acute kidney injury)    With STEMI in 2017   Chronic systolic CHF (congestive heart failure) ([name])    Depression    Diabetes mellitus without complication ([name])    Diabetic retinopathy ([name])    GERD (gastroesophageal reflux disease)    Hx of adenomatous colonic polyps 04/07/2018   Hyperlipidemia    Hypertension    Hypertensive retinopathy    Paroxysmal atrial fibrillation ([name])    Peripheral vascular disease    Pneumonia    Seizures ([name])    hx of as a child   STEMI (ST elevation myocardial infarction) ([name]) 2017    Past Surgical History:  Procedure Laterality Date   ABDOMINAL AORTOGRAM W/LOWER EXTREMITY N/A 09/19/2016   Procedure: Abdominal Aortogram w/Lower Extremity;  Surgeon: Deatrice DELENA Cage, MD;  Location: [facility];  Service: Cardiovascular;  Laterality: N/A;   ABDOMINAL AORTOGRAM W/LOWER EXTREMITY N/A 07/17/2023   Procedure: ABDOMINAL AORTOGRAM W/LOWER EXTREMITY;  Surgeon: Cage Deatrice DELENA, MD;  Location: [facility];  Service: Cardiovascular;  Laterality: N/A;   AMPUTATION Right 03/23/2016   Procedure:  1st and 2nd Ray Amputation Right Foot;  Surgeon: Jerona LULLA Sage, MD;  Location: [facility] OR;  Service: Orthopedics;  Laterality: Right;   AMPUTATION Right 06/21/2016   Procedure: RIGHT TRANSMETATARSAL AMPUTATION;  Surgeon: Jerona LULLA Sage, MD;  Location: [facility] OR;  Service: Orthopedics;  Laterality: Right;   CARDIAC CATHETERIZATION N/A 03/13/2016   Procedure: Right/Left Heart Cath and Coronary Angiography;   Surgeon: Ozell Fell, MD;  Location: [facility] INVASIVE CV LAB;  Service: Cardiovascular;  Laterality: N/A;   CARDIAC CATHETERIZATION N/A 03/13/2016   Procedure: IABP Insertion;  Surgeon: Ozell Fell, MD;  Location: [facility] INVASIVE CV LAB;  Service: Cardiovascular;  Laterality: N/A;   CATARACT EXTRACTION     CATARACT EXTRACTION, BILATERAL     CERVICAL FUSION  1982, 1992   has had 3 neck surgeries from breaking his neck   CIRCUMCISION N/A 11/07/2021   Procedure: CIRCUMCISION ADULT;  Surgeon: Lovie Arlyss CROME, MD;  Location: [facility] ORS;  Service: Urology;  Laterality: N/A;   CORONARY ARTERY BYPASS GRAFT N/A 03/13/2016   Procedure: CORONARY ARTERY BYPASS GRAFTING (CABG) x 1 (SVG to OM) with EVH from LEFT GREATER SAPHENOUS VEIN;  Surgeon: Maude Fleeta Ochoa, MD;  Location: [facility] OR;  Service: Open Heart Surgery;  Laterality: N/A;   EYE SURGERY     LOWER EXTREMITY ANGIOGRAM  05/02/2016   Procedure: Lower Extremity Angiogram;  Surgeon: Deatrice DELENA Cage, MD;  Location: [facility];  Service: Cardiovascular;;  Limited left femoral runoff right femoral runoff   PERIPHERAL VASCULAR BALLOON ANGIOPLASTY  07/17/2023   Procedure: PERIPHERAL VASCULAR BALLOON ANGIOPLASTY;  Surgeon: Cage Deatrice DELENA, MD;  Location: [facility];  Service: Cardiovascular;;   PERIPHERAL VASCULAR CATHETERIZATION N/A 05/02/2016   Procedure: Abdominal Aortogram;  Surgeon: Deatrice DELENA Cage, MD;  Location: [facility];  Service: Cardiovascular;  Laterality: N/A;   PERIPHERAL VASCULAR CATHETERIZATION Right 05/02/2016   Procedure: Peripheral Vascular Balloon Angioplasty;  Surgeon: Deatrice DELENA Cage, MD;  Location: [facility];  Service: Cardiovascular;  Laterality: Right;  SFA   TEE WITHOUT CARDIOVERSION N/A 03/13/2016   Procedure: TRANSESOPHAGEAL ECHOCARDIOGRAM (TEE);  Surgeon: Maude Fleeta Ochoa, MD;  Location: [facility] OR;  Service: Open Heart Surgery;  Laterality: N/A;   VSD REPAIR N/A 03/13/2016   Procedure: VENTRICULAR SEPTAL  DEFECT (VSD) REPAIR;  Surgeon: Maude Fleeta Ochoa, MD;  Location: [facility] OR;  Service: Open Heart Surgery;  Laterality: N/A;     Current Outpatient Medications  Medication Sig Dispense Refill   acetaminophen  (TYLENOL ) 500 MG tablet Take 500 mg by mouth every 6 (six) hours as needed for moderate pain.     aspirin  EC 81 MG tablet Take 1 tablet (81 mg total) by mouth daily. Swallow whole.     Blood Glucose Monitoring Suppl DEVI Use to test blood sugar morning, at noon, and at bedtime. 1 each 1   carvedilol  (COREG ) 3.125 MG tablet Take 1 tablet (3.125 mg total) by mouth 2 (two) times daily with a meal. 180 tablet 1   cefpodoxime (VANTIN) 200 MG tablet Take 1 tablet (200 mg total) by mouth 2 (two) times daily for 7 days. 14 tablet 0   Continuous Glucose Receiver (DEXCOM G7 RECEIVER) DEVI Use to monitor blood sugar. 9 each 1   Continuous Glucose Sensor (DEXCOM G7 SENSOR) MISC INJECT 1 SENSOR EVERY 10 DAYS TO MONITOR BLOOD SUGAR 9 each 1   dapagliflozin  propanediol (FARXIGA ) 10 MG TABS tablet Take 1 tablet (10 mg total) by mouth daily. 90 tablet 1  gabapentin  (NEURONTIN ) 300 MG capsule Take 1 capsule (300 mg total) by mouth 2 (two) times daily. 180 capsule 0   glucose blood (ACCU-CHEK GUIDE TEST) test strip USE TO CHECK BLOOD SUGAR IN THE AM, AT NOON AND AT BEDTIME 100 strip 5   icosapent  Ethyl (VASCEPA ) 1 g capsule Take 2 capsules (2 g total) by mouth 2 (two) times daily. 360 capsule 1   insulin  glargine (LANTUS  SOLOSTAR) 100 UNIT/ML Solostar Pen ADMINISTER 40 UNITS UNDER THE SKIN AT BEDTIME 36 mL 1   Insulin  Glargine Solostar (LANTUS ) 100 UNIT/ML Solostar Pen Inject 55 Units into the skin every evening. 45 mL 0   Insulin  Pen Needle 31G X 8 MM MISC Use once daily as directed. 100 each 2   Insulin  Pen Needle 31G X 8 MM MISC Use to inject insulin  four times daily. 300 each 2   Lancets Misc. (ACCU-CHEK FASTCLIX LANCET) KIT Use in the morning, noon and at bedtime. 1 kit 5   lipase/protease/amylase (CREON)  36000 UNITS CPEP capsule Take 1 capsule (36,000 Units total) by mouth 3 (three) times daily before meals. 270 capsule 0   OZEMPIC , 0.25 OR 0.5 MG/DOSE, 2 MG/3ML SOPN Inject 0.25 mg into the skin once a week.     pantoprazole  (PROTONIX ) 40 MG tablet Take 1 tablet (40 mg total) by mouth 2 (two) times daily before a meal. 180 tablet 1   pramipexole  (MIRAPEX ) 0.125 MG tablet Take 1 tablet (0.125 mg total) by mouth at bedtime. 90 tablet 0   rosuvastatin  (CRESTOR ) 40 MG tablet Take 1 tablet (40 mg total) by mouth daily. 90 tablet 1   torsemide  (DEMADEX ) 20 MG tablet Take 1 tablet (20 mg total) by mouth 3 (three) times a week. 36 tablet 1   warfarin (COUMADIN ) 5 MG tablet TAKE 1 TO 1 AND 1/2 TABLETS BY MOUTH DAILY AS DIRECTED BY COUMADIN  CLINIC 120 tablet 1   prednisoLONE  acetate (PRED FORTE ) 1 % ophthalmic suspension Place 1 drop into the right eye 4 (four) times daily for 7 days. 5 mL 0   No current facility-administered medications for this visit.    Allergies:   Morphine and codeine  and Latex    Social History:  The patient  reports that he quit smoking about 8 years ago. His smoking use included cigarettes. He has never used smokeless tobacco. He reports that he does not drink alcohol and does not use drugs.   Family History:  Not able to obtain due to distress.  ROS:  Please see the history of present illness.   Otherwise, review of systems are positive for none.   All other systems are reviewed and negative.    PHYSICAL EXAM: VS:  BP (!) 104/50   Pulse 79   Ht 5' 5 (1.651 m)   Wt 171 lb 4.8 oz (77.7 kg)   SpO2 97%   BMI 28.51 kg/m  , BMI Body mass index is 28.51 kg/m. GEN: Well nourished, well developed, in no acute distress  HEENT: normal  Neck: no JVD, carotid bruits, or masses Cardiac: RRR; no  rubs, or gallops, . No murmurs Respiratory:  clear to auscultation bilaterally, normal work of breathing GI: soft, nontender, nondistended, + BS MS: no deformity or atrophy  Skin:  warm and dry, no rash Neuro:  Strength and sensation are intact Psych: euthymic mood, full affect    EKG:  EKG is  not ordered today. I reviewed recent EKG done with [name].  It showed normal sinus rhythm  with prior inferior infarct.  No significant change from before.  Recent Labs: 08/19/2023: Pro B Natriuretic peptide (BNP) 238.0 10/01/2023: TSH 0.655 04/29/2024: ALT 68; BUN 37; Creatinine, Ser 1.58; Hemoglobin 14.4; Platelets 180.0; Potassium 4.0; Sodium 139    Lipid Panel    Component Value Date/Time   CHOL 106 08/19/2023 1432   CHOL 90 (L) 01/05/2019 1123   TRIG 314.0 (H) 04/29/2024 1513   HDL 26.10 (L) 08/19/2023 1432   HDL 34 (L) 01/05/2019 1123   CHOLHDL 4 08/19/2023 1432   VLDL 59.2 (H) 08/19/2023 1432   LDLCALC 20 08/19/2023 1432   LDLCALC 24 01/05/2019 1123   LDLCALC 45 03/20/2017 1144   LDLDIRECT 54.0 11/05/2022 1141      Wt Readings from Last 3 Encounters:  05/12/24 171 lb 4.8 oz (77.7 kg)  05/11/24 170 lb (77.1 kg)  04/29/24 167 lb (75.8 kg)          No data to display             ASSESSMENT AND PLAN:  1.  Peripheral arterial disease :  He is status post drug-coated balloon angioplasty of the right SFA in 2017.  Most recently, he had left SFA drug-coated balloon angioplasty with good results and improvement in ABI.  Currently with no ulceration on the left foot.  Continue medical therapy.  2. Chronic systolic heart failure: He appears to be euvolemic.   He is no longer on losartan  and carvedilol  due to low blood pressure and chronic kidney disease.  He appears to be euvolemic on torsemide  20 mg 3 times daily.  Continue Farxiga .  Most recent ejection fraction was 40 to 45%  3. Paroxysmal atrial fibrillation: He is maintaining in sinus rhythm.  He is on long-term anticoagulation with warfarin with no bleeding complications.  His INR has been therapeutic.  4. Coronary artery disease involving native coronary arteries without angina:  status post  one-vessel CABG and VSD repair. Continue medical therapy.  5. Diabetes mellitus: Improved from before.  6. Hyperlipidemia: Recent lipid profile showed an LDL of 54.  Continue rosuvastatin .  7.  Preop cardiovascular evaluation for possible cholecystectomy: He is at a moderate risk from a cardiac standpoint.  Does not require ischemic cardiac evaluation given stable symptoms and EKG.  Warfarin can be held 5 days before with no need for bridging.  If possible, aspirin  81 mg once daily should be continued.   Disposition:   Follow-up in 6 months.  Signed,  Deatrice Cage, MD  05/14/2024 4:19 PM    [facility]

## 2024-05-12 NOTE — Patient Instructions (Signed)
 Medication Instructions:  No changes *If you need a refill on your cardiac medications before your next appointment, please call your pharmacy*  Lab Work: None ordered If you have labs (blood work) drawn today and your tests are completely normal, you will receive your results only by: MyChart Message (if you have MyChart) OR A paper copy in the mail If you have any lab test that is abnormal or we need to change your treatment, we will call you to review the results.  Testing/Procedures: None ordered  Follow-Up: At [facility], you and your health needs are our priority.  As part of our continuing mission to provide you with exceptional heart care, our providers are all part of one team.  This team includes your primary Cardiologist (physician) and Advanced Practice Providers or APPs (Physician Assistants and Nurse Practitioners) who all work together to provide you with the care you need, when you need it.  Your next appointment:   6 month(s)  Provider:   Antionette Kirks, MD    We recommend signing up for the patient portal called "MyChart".  Sign up information is provided on this After Visit Summary.  MyChart is used to connect with patients for Virtual Visits (Telemedicine).  Patients are able to view lab/test results, encounter notes, upcoming appointments, etc.  Non-urgent messages can be sent to your provider as well.   To learn more about what you can do with MyChart, go to [email or web address].

## 2024-05-13 ENCOUNTER — Ambulatory Visit (INDEPENDENT_AMBULATORY_CARE_PROVIDER_SITE_OTHER): Admitting: Ophthalmology

## 2024-05-13 ENCOUNTER — Other Ambulatory Visit: Payer: Self-pay

## 2024-05-13 ENCOUNTER — Encounter (INDEPENDENT_AMBULATORY_CARE_PROVIDER_SITE_OTHER): Payer: Self-pay | Admitting: Ophthalmology

## 2024-05-13 ENCOUNTER — Other Ambulatory Visit (HOSPITAL_COMMUNITY): Payer: Self-pay

## 2024-05-13 DIAGNOSIS — Z7985 Long-term (current) use of injectable non-insulin antidiabetic drugs: Secondary | ICD-10-CM | POA: Diagnosis not present

## 2024-05-13 DIAGNOSIS — I10 Essential (primary) hypertension: Secondary | ICD-10-CM

## 2024-05-13 DIAGNOSIS — E113313 Type 2 diabetes mellitus with moderate nonproliferative diabetic retinopathy with macular edema, bilateral: Secondary | ICD-10-CM | POA: Diagnosis not present

## 2024-05-13 DIAGNOSIS — Z794 Long term (current) use of insulin: Secondary | ICD-10-CM

## 2024-05-13 DIAGNOSIS — Z961 Presence of intraocular lens: Secondary | ICD-10-CM

## 2024-05-13 DIAGNOSIS — H35033 Hypertensive retinopathy, bilateral: Secondary | ICD-10-CM

## 2024-05-13 MED ORDER — PREDNISOLONE ACETATE 1 % OP SUSP
1.0000 [drp] | Freq: Four times a day (QID) | OPHTHALMIC | 0 refills | Status: AC
  Filled 2024-05-13: qty 5, 7d supply, fill #0

## 2024-05-15 ENCOUNTER — Other Ambulatory Visit (HOSPITAL_COMMUNITY): Payer: Self-pay

## 2024-05-15 ENCOUNTER — Telehealth (HOSPITAL_BASED_OUTPATIENT_CLINIC_OR_DEPARTMENT_OTHER): Payer: Self-pay | Admitting: *Deleted

## 2024-05-15 MED ORDER — PRAMIPEXOLE DIHYDROCHLORIDE 0.125 MG PO TABS
0.1250 mg | ORAL_TABLET | Freq: Every day | ORAL | 0 refills | Status: AC
  Filled 2024-05-15: qty 90, 90d supply, fill #0

## 2024-05-15 NOTE — Telephone Encounter (Signed)
   Pre-operative Risk Assessment    Patient Name: William [name]  DOB: 01-19-1951 MRN: [id number]   Date of last office visit: 05/12/24 DR. ARIDA Date of next office visit: NONE   Request for Surgical Clearance    Procedure:  CHOLECYSTECTOMY   Date of Surgery:  Clearance TBD                                Surgeon:  DR. PAUL STECHSCHULTE Surgeon's Group or Practice Name:  [facility] Phone number:  [phone] Fax number:  [phone] HOLLIE TROY, CMA   Type of Clearance Requested:   - Medical  - Pharmacy:  Hold Warfarin (Coumadin )     Type of Anesthesia:  General    Additional requests/questions:    Signed, Houston Surges   05/15/2024, 4:15 PM

## 2024-05-15 NOTE — Telephone Encounter (Signed)
   Patient Name: William [name]  DOB: 04/11/1951 MRN: [id number]  Primary Cardiologist: Deatrice Cage, MD  Chart reviewed as part of pre-operative protocol coverage. Patient was recently seen by Dr. Cage on 05/12/2024 and mentioned potential need for a cholecystectomy. Per Dr. Renato note: Preop cardiovascular evaluation for possible cholecystectomy: He is at a moderate risk from a cardiac standpoint.  Does not require ischemic cardiac evaluation given stable symptoms and EKG.  Warfarin can be held 5 days before with no need for bridging.  If possible, aspirin  81 mg once daily should be continued.  I will route this recommendation to the requesting party via Epic fax function and remove from pre-op pool.  Please call with questions.  Merric Yost E Kalyn Hofstra, PA-C 05/15/2024, 4:26 PM

## 2024-05-18 NOTE — Progress Notes (Signed)
 [facility] - Clinic Note  06/01/2024    CHIEF COMPLAINT Patient presents for Retina Follow Up  HISTORY OF PRESENT ILLNESS: William [name] is a 73 y.o. male who presents to the clinic today for:   HPI     Retina Follow Up   Patient presents with  Diabetic Retinopathy.  In both eyes.  This started 5 weeks ago.  I, the attending physician,  performed the HPI with the patient and updated documentation appropriately.        Comments   Patient here for 5 weeks retina follow up for NPDR OU. Patient states vision doing ok. No eye pain. Off warfarin. Having surgery tomorrow. Then will be back on warfarin.       Last edited by Valdemar Rogue, MD on 06/01/2024  8:18 AM.      Referring physician: Joshua Debby CROME, MD [address] [place],  [place] [address]  HISTORICAL INFORMATION:  Selected notes from the MEDICAL RECORD NUMBER Referred by Dr. Lelon for eval of DME OU LEE:  Ocular Hx- PMH-    CURRENT MEDICATIONS: No current outpatient medications on file. (Ophthalmic Drugs)   No current facility-administered medications for this visit. (Ophthalmic Drugs)   Current Outpatient Medications (Other)  Medication Sig   acetaminophen  (TYLENOL ) 500 MG tablet Take 500 mg by mouth every 6 (six) hours as needed for moderate pain.   aspirin  EC 81 MG tablet Take 1 tablet (81 mg total) by mouth daily. Swallow whole.   Blood Glucose Monitoring Suppl DEVI Use to test blood sugar morning, at noon, and at bedtime.   Continuous Glucose Receiver (DEXCOM G7 RECEIVER) DEVI Use to monitor blood sugar.   Continuous Glucose Sensor (DEXCOM G7 SENSOR) MISC INJECT 1 SENSOR EVERY 10 DAYS TO MONITOR BLOOD SUGAR   dapagliflozin  propanediol (FARXIGA ) 10 MG TABS tablet Take 1 tablet (10 mg total) by mouth daily.   gabapentin  (NEURONTIN ) 300 MG capsule Take 1 capsule (300 mg total) by mouth 2 (two) times daily.   glucose blood (ACCU-CHEK GUIDE TEST) test strip USE TO CHECK BLOOD SUGAR IN THE  AM, AT NOON AND AT BEDTIME   icosapent  Ethyl (VASCEPA ) 1 g capsule Take 2 capsules (2 g total) by mouth 2 (two) times daily.   insulin  glargine (LANTUS  SOLOSTAR) 100 UNIT/ML Solostar Pen ADMINISTER 40 UNITS UNDER THE SKIN AT BEDTIME   Insulin  Pen Needle 31G X 8 MM MISC Use once daily as directed.   Insulin  Pen Needle 31G X 8 MM MISC Use to inject insulin  four times daily.   lipase/protease/amylase (CREON ) 36000 UNITS CPEP capsule Take 1 capsule (36,000 Units total) by mouth 3 (three) times daily before meals.   pantoprazole  (PROTONIX ) 40 MG tablet Take 1 tablet (40 mg total) by mouth 2 (two) times daily before a meal.   pramipexole  (MIRAPEX ) 0.125 MG tablet Take 1 tablet (0.125 mg total) by mouth at bedtime.   rosuvastatin  (CRESTOR ) 40 MG tablet Take 1 tablet (40 mg total) by mouth daily.   carvedilol  (COREG ) 3.125 MG tablet Take 1 tablet (3.125 mg total) by mouth 2 (two) times daily with a meal. (Patient not taking: Reported on 06/01/2024)   Insulin  Glargine Solostar (LANTUS ) 100 UNIT/ML Solostar Pen Inject 55 Units into the skin every evening. (Patient not taking: Reported on 06/01/2024)   Lancets Misc. (ACCU-CHEK FASTCLIX LANCET) KIT Use in the morning, noon and at bedtime.   OZEMPIC , 0.25 OR 0.5 MG/DOSE, 2 MG/3ML SOPN Inject 0.25 mg into the skin  once a week.   torsemide  (DEMADEX ) 20 MG tablet Take 1 tablet (20 mg total) by mouth 3 (three) times a week. (Patient taking differently: Take 20 mg by mouth 2 (two) times a week.)   warfarin (COUMADIN ) 5 MG tablet TAKE 1 TO 1 AND 1/2 TABLETS BY MOUTH DAILY AS DIRECTED BY COUMADIN  CLINIC (Patient taking differently: Take 5-7.5 mg by mouth See admin instructions. Take 5 mg by mouth on Monday and Friday and take 7.5 mg on Tuesday, Wednesday, Thursday, Saturday and Sunday)   No current facility-administered medications for this visit. (Other)   REVIEW OF SYSTEMS: ROS   Positive for: Gastrointestinal, Genitourinary, Endocrine, Eyes Negative for:  Constitutional, Neurological, Skin, Musculoskeletal, HENT, Cardiovascular, Respiratory, Psychiatric, Allergic/Imm, Heme/Lymph Last edited by Orval Asberry RAMAN, COA on 06/01/2024  7:57 AM.       ALLERGIES Allergies  Allergen Reactions   Morphine And Codeine  Shortness Of Breath and Other (See Comments)    UNSPECIFIED REACTION Pt said it was too much    Latex Rash   PAST MEDICAL HISTORY Past Medical History:  Diagnosis Date   AKI (acute kidney injury)    With STEMI in 2017   Arthritis    Chronic systolic CHF (congestive heart failure) ([name])    Depression    Diabetes mellitus without complication ([name])    Diabetic retinopathy ([name])    GERD (gastroesophageal reflux disease)    Hx of adenomatous colonic polyps 04/07/2018   Hyperlipidemia    Hypertension    Hypertensive retinopathy    Paroxysmal atrial fibrillation ([name])    Peripheral vascular disease    Pneumonia    Seizures ([name])    hx of as a child   STEMI (ST elevation myocardial infarction) ([name]) 2017   Past Surgical History:  Procedure Laterality Date   ABDOMINAL AORTOGRAM W/LOWER EXTREMITY N/A 09/19/2016   Procedure: Abdominal Aortogram w/Lower Extremity;  Surgeon: Deatrice DELENA Cage, MD;  Location: [facility];  Service: Cardiovascular;  Laterality: N/A;   ABDOMINAL AORTOGRAM W/LOWER EXTREMITY N/A 07/17/2023   Procedure: ABDOMINAL AORTOGRAM W/LOWER EXTREMITY;  Surgeon: Cage Deatrice DELENA, MD;  Location: [facility];  Service: Cardiovascular;  Laterality: N/A;   AMPUTATION Right 03/23/2016   Procedure: 1st and 2nd Ray Amputation Right Foot;  Surgeon: Jerona LULLA Sage, MD;  Location: [facility] OR;  Service: Orthopedics;  Laterality: Right;   AMPUTATION Right 06/21/2016   Procedure: RIGHT TRANSMETATARSAL AMPUTATION;  Surgeon: Jerona LULLA Sage, MD;  Location: [facility] OR;  Service: Orthopedics;  Laterality: Right;   CARDIAC CATHETERIZATION N/A 03/13/2016   Procedure: Right/Left Heart Cath and Coronary Angiography;  Surgeon:  Ozell Fell, MD;  Location: [facility] INVASIVE CV LAB;  Service: Cardiovascular;  Laterality: N/A;   CARDIAC CATHETERIZATION N/A 03/13/2016   Procedure: IABP Insertion;  Surgeon: Ozell Fell, MD;  Location[facility] CV LAB;  Service: Cardiovascular;  Laterality: N/A;   CATARACT EXTRACTION     CATARACT EXTRACTION, BILATERAL     CERVICAL FUSION  1982, 1992   has had 3 neck surgeries from breaking his neck   CIRCUMCISION N/A 11/07/2021   Procedure: CIRCUMCISION ADULT;  Surgeon: Lovie Arlyss CROME, MD;  Location: [facility] ORS;  Service: Urology;  Laterality: N/A;   CORONARY ARTERY BYPASS GRAFT N/A 03/13/2016   Procedure: CORONARY ARTERY BYPASS GRAFTING (CABG) x 1 (SVG to OM) with EVH from LEFT GREATER SAPHENOUS VEIN;  Surgeon: Maude Fleeta Ochoa, MD;  Location: [facility] OR;  Service: Open Heart Surgery;  Laterality: N/A;   EYE SURGERY  LOWER EXTREMITY ANGIOGRAM  05/02/2016   Procedure: Lower Extremity Angiogram;  Surgeon: Deatrice DELENA Cage, MD;  Location: [facility];  Service: Cardiovascular;;  Limited left femoral runoff right femoral runoff   PERIPHERAL VASCULAR BALLOON ANGIOPLASTY  07/17/2023   Procedure: PERIPHERAL VASCULAR BALLOON ANGIOPLASTY;  Surgeon: Cage Deatrice DELENA, MD;  Location: [facility];  Service: Cardiovascular;;   PERIPHERAL VASCULAR CATHETERIZATION N/A 05/02/2016   Procedure: Abdominal Aortogram;  Surgeon: Deatrice DELENA Cage, MD;  Location: [facility];  Service: Cardiovascular;  Laterality: N/A;   PERIPHERAL VASCULAR CATHETERIZATION Right 05/02/2016   Procedure: Peripheral Vascular Balloon Angioplasty;  Surgeon: Deatrice DELENA Cage, MD;  Location: [facility];  Service: Cardiovascular;  Laterality: Right;  SFA   TEE WITHOUT CARDIOVERSION N/A 03/13/2016   Procedure: TRANSESOPHAGEAL ECHOCARDIOGRAM (TEE);  Surgeon: Maude Fleeta Ochoa, MD;  Location: [facility] OR;  Service: Open Heart Surgery;  Laterality: N/A;   VSD REPAIR N/A 03/13/2016   Procedure: VENTRICULAR SEPTAL DEFECT  (VSD) REPAIR;  Surgeon: Maude Fleeta Ochoa, MD;  Location: [facility] OR;  Service: Open Heart Surgery;  Laterality: N/A;   FAMILY HISTORY Family History  Problem Relation Age of Onset   Diabetes Maternal Grandmother    Diabetes Mother    Aneurysm Mother    Peripheral Artery Disease Mother    Coronary artery disease Mother    Peptic Ulcer Father    Retinoblastoma Daughter    Colon cancer Neg Hx    Rectal cancer Neg Hx    SOCIAL HISTORY Social History   Tobacco Use   Smoking status: Former    Current packs/day: 0.00    Types: Cigarettes    Quit date: 11/20/2015    Years since quitting: 8.5   Smokeless tobacco: Never   Tobacco comments:    quit 2018  Vaping Use   Vaping status: Never Used  Substance Use Topics   Alcohol use: No   Drug use: No       OPHTHALMIC EXAM: Base Eye Exam     Visual Acuity (Snellen - Linear)       Right Left   Dist [place] 20/25 +1 20/20         Tonometry (Tonopen, 7:54 AM)       Right Left   Pressure 14 15         Pupils       Dark Light Shape React APD   Right 2 1 Round Brisk None   Left              Visual Fields (Counting fingers)       Left Right    Full Full         Extraocular Movement       Right Left    Full, Ortho Full, Ortho         Neuro/Psych     Oriented x3: Yes   Mood/Affect: Normal         Dilation     Both eyes: 1.0% Mydriacyl, 2.5% Phenylephrine  @ 7:54 AM           Slit Lamp and Fundus Exam     Slit Lamp Exam       Right Left   Lids/Lashes Dermatochalasis - upper lid, mild MGD Dermatochalasis - upper lid, mild MGD   Conjunctiva/Sclera White and quiet White and quiet   Cornea trace PEE, well healed cataract wound, mild tear film debris 1+ fine PEE, mild tear film debris, well healed cataract  wound   Anterior Chamber Deep and quiet Deep and quiet   Iris Round and dilated, No NVI Round and dilated, No NVI   Lens PC IOL in good position PC IOL in good position   Anterior Vitreous Vitreous  syneresis, vitreous condensations Vitreous syneresis, Posterior vitreous detachment, vitreous condensations         Fundus Exam       Right Left   Disc trace Pallor, Sharp rim mild Pallor, Sharp rim, mild tilt   C/D Ratio 0.3 0.3   Macula Flat, good foveal reflex, scattered Microaneurysms, cystic changes inferior fovea and mac --slightly improved, good focal laser changes superiorly, good early focal laser changes inferiorly Flat, good foveal reflex, scattered MA -- stably improved, persistent cystic changes temporal and IN mac, good focal laser changes   Vessels attenuated, mild tortuosity, no NV attenuated, Tortuous   Periphery Attached, +MA greatest posteriorly Attached, scattered MA greatest posteriorly           IMAGING AND PROCEDURES  Imaging and Procedures for 06/01/2024  OCT, Retina - OU - Both Eyes       Right Eye Quality was good. Central Foveal Thickness: 303. Progression has improved. Findings include no SRF, abnormal foveal contour, intraretinal hyper-reflective material, intraretinal fluid, vitreomacular adhesion (Persistent IRF/cystic changes inferior fovea and mac --slightly improved).   Left Eye Quality was good. Central Foveal Thickness: 270. Progression has been stable. Findings include normal foveal contour, no SRF, intraretinal hyper-reflective material, intraretinal fluid (Persistent cystic changes IN and temporal macula).   Notes *Images captured and stored on drive  Diagnosis / Impression:  +DME OU OD: Persistent IRF/cystic changes inferior fovea and mac --slightly improved OS: Persistent cystic changes IN and temporal macula   Clinical management:  See below  Abbreviations: NFP - Normal foveal profile. CME - cystoid macular edema. PED - pigment epithelial detachment. IRF - intraretinal fluid. SRF - subretinal fluid. EZ - ellipsoid zone. ERM - epiretinal membrane. ORA - outer retinal atrophy. ORT - outer retinal tubulation. SRHM - subretinal  hyper-reflective material. IRHM - intraretinal hyper-reflective material      Intravitreal Injection, Pharmacologic Agent - OD - Right Eye       Time Out 06/01/2024. 8:22 AM. Confirmed correct patient, procedure, site, and patient consented.   Anesthesia Topical anesthesia was used. Anesthetic medications included Lidocaine  2%, Proparacaine 0.5%.   Procedure Preparation included 5% betadine  to ocular surface, eyelid speculum. A supplied (32g) needle was used.   Injection: 1.25 mg Bevacizumab  1.25mg /0.33ml   Route: Intravitreal, Site: Right Eye   NDC: C2662926, Lot: [id number]$ , Expiration date: 07/02/2024   Post-op Post injection exam found visual acuity of at least counting fingers. The patient tolerated the procedure well. There were no complications. The patient received written and verbal post procedure care education. Post injection medications were not given.      Intravitreal Injection, Pharmacologic Agent - OS - Left Eye       Time Out 06/01/2024. 8:23 AM. Confirmed correct patient, procedure, site, and patient consented.   Anesthesia Topical anesthesia was used. Anesthetic medications included Lidocaine  2%, Proparacaine 0.5%.   Procedure Preparation included 5% betadine  to ocular surface, eyelid speculum. A (32g) needle was used.   Injection: 1.25 mg Bevacizumab  1.25mg /0.72ml   Route: Intravitreal, Site: Left Eye   NDC: C2662926, Lot: [id number], Expiration date: 08/22/2024   Post-op Post injection exam found visual acuity of at least counting fingers. The patient tolerated the procedure well. There were no complications. The patient received written  and verbal post procedure care education. Post injection medications were not given.            ASSESSMENT/PLAN:    ICD-10-CM   1. Moderate nonproliferative diabetic retinopathy of both eyes with macular edema associated with type 2 diabetes mellitus ([name])  E11.3313 OCT, Retina - OU - Both Eyes     Intravitreal Injection, Pharmacologic Agent - OD - Right Eye    Intravitreal Injection, Pharmacologic Agent - OS - Left Eye    Bevacizumab  (AVASTIN ) SOLN 1.25 mg    Bevacizumab  (AVASTIN ) SOLN 1.25 mg    2. Current use of insulin  ([name])  Z79.4     3. Long-term (current) use of injectable non-insulin  antidiabetic drugs  Z79.85     4. Essential hypertension  I10     5. Hypertensive retinopathy of both eyes  H35.033     6. Pseudophakia, both eyes  Z96.1      1-3. Moderate non-proliferative diabetic retinopathy, both eyes  - A1c was 6.7 (06.06.25), 6.5 (02.17.25) - s/p IVA OD #1 (11.14.22), #2 (12.12.22), #3 (01.09.23), #4 (06.23.25), #5 (08.04.25), #6(09.02.25), #7 (09.29.25), #8 (10.27.25) - s/p IVA OS #1 (10.17.22), #2 (11.14.22), #3 (12.12.22), #4 (01.09.23), #5 (03.15.23), #6 (04.12.23), #7 (08.04.25), #8 (09.02.25), #9 (09.29.25), #10 (10.27.25) ================== - s/p IVE OD #1 (03.15.23), #2 (04.12.23), #3 (05.10.23), #4 (06.07.23), #5 (07.05.23), #6 (08.02.23), #7 (08.31.23), #8 (09.28.23), #9 (10.30.23), #10 (11.28.23), #11 (12.28.23), #12 (01.29.24), #13 (02.20.24), #14 (03.25.24), #15 (04.22.24), #16 (05.20.24), #17 (06.17.24), #18 (07.22.24), #19 (08.26.24), #20 (09.30.24), #21 (11.04.24), #22 (12.16.24) - s/p IVE OS #1 (05.10.23), #2 (06.07.23), #3 (07.05.23), #4 (08.02.23), #5 (08.31.23), #6 (09.28.23), #7 (10.30.23), #8 (11.28.23), #9 (12.28.23), #10 (01.29.24), #11 (02.20..24), #12 (03.25.24), #13 (04.22.24), #14 (05.20.24), #15 (06.17.24), #16 (07.22.24), #17 (09.30.24), #18 (11.04.24), #19 (12.16.24) ==================== - s/p focal laser OS (06.25.24) - s/p focal laser OD #1 (01.13.25), #2 (11.12.25) - exam shows scattered MA, DBH OU - FA (10.17.22) shows late leaking MA OU, no NV OU - repeat FA 10.27.25) shows OD: Scattered late leaking MA greatest inferior to fovea, No NV, OS: Scattered late leaking MA greatest ST to fovea, No NV - OCT shows OD: Persistent IRF/cystic  changes inferior fovea and mac --slightly improved; OS: Persistent cystic changes IN and temporal macula at 5 wks - BCVA OD 20/25 - stable; OS stable at 20/20 - recommend IVA today OU (12.01.25) w/ f/u in 5-6 weeks due to holiday  - pt wishes to proceed with focal laser - RBA of procedure discussed, questions answered - informed consent obtained and signed - see procedure note  - IVA informed consent obtained and signed, 06.23.25 - 5-6 weeks - DFE, OCT, likely IVA OU  4,5. Hypertensive retinopathy OU - discussed importance of tight BP control  - monitor  6. Pseudophakia OU  - s/p CE/IOL OU  - IOL in good position, doing well  - monitor  Ophthalmic Meds Ordered this visit:  Meds ordered this encounter  Medications   Bevacizumab  (AVASTIN ) SOLN 1.25 mg   Bevacizumab  (AVASTIN ) SOLN 1.25 mg     Return for 5-6 weeks NPDR OU, DFE, OCT, Possible Injxn.  There are no Patient Instructions on file for this visit.  Explained the diagnoses, plan, and follow up with the patient and they expressed understanding.  Patient expressed understanding of the importance of proper follow up care.   This document serves as a record of services personally performed by Redell JUDITHANN Hans, MD, PhD. It was created on their  behalf by Avelina Pereyra, COA an ophthalmic technician. The creation of this record is the provider's dictation and/or activities during the visit.   Electronically signed by: Avelina GORMAN Pereyra, COT  06/01/24  8:35 AM   This document serves as a record of services personally performed by Redell JUDITHANN Hans, MD, PhD. It was created on their behalf by Almetta Pesa, an ophthalmic technician. The creation of this record is the provider's dictation and/or activities during the visit.    Electronically signed by: Almetta Pesa, OA, 06/01/24  8:35 AM  Redell JUDITHANN Hans, M.D., Ph.D. Diseases & Surgery of the Retina and Vitreous [facility]  I have reviewed the above  documentation for accuracy and completeness, and I agree with the above. Redell JUDITHANN Hans, M.D., Ph.D. 06/01/24 8:36 AM   Abbreviations: M myopia (nearsighted); A astigmatism; H hyperopia (farsighted); P presbyopia; Mrx spectacle prescription;  CTL contact lenses; OD right eye; OS left eye; OU both eyes  XT exotropia; ET esotropia; PEK punctate epithelial keratitis; PEE punctate epithelial erosions; DES dry eye syndrome; MGD meibomian gland dysfunction; ATs artificial tears; PFAT's preservative free artificial tears; NSC nuclear sclerotic cataract; PSC posterior subcapsular cataract; ERM epi-retinal membrane; PVD posterior vitreous detachment; RD retinal detachment; DM diabetes mellitus; DR diabetic retinopathy; NPDR non-proliferative diabetic retinopathy; PDR proliferative diabetic retinopathy; CSME clinically significant macular edema; DME diabetic macular edema; dbh dot blot hemorrhages; CWS cotton wool spot; POAG primary open angle glaucoma; C/D cup-to-disc ratio; HVF humphrey visual field; GVF goldmann visual field; OCT optical coherence tomography; IOP intraocular pressure; BRVO Branch retinal vein occlusion; CRVO central retinal vein occlusion; CRAO central retinal artery occlusion; BRAO branch retinal artery occlusion; RT retinal tear; SB scleral buckle; PPV pars plana vitrectomy; VH Vitreous hemorrhage; PRP panretinal laser photocoagulation; IVK intravitreal kenalog; VMT vitreomacular traction; MH Macular hole;  NVD neovascularization of the disc; NVE neovascularization elsewhere; AREDS age related eye disease study; ARMD age related macular degeneration; POAG primary open angle glaucoma; EBMD epithelial/anterior basement membrane dystrophy; ACIOL anterior chamber intraocular lens; IOL intraocular lens; PCIOL posterior chamber intraocular lens; Phaco/IOL phacoemulsification with intraocular lens placement; PRK photorefractive keratectomy; LASIK laser assisted in situ keratomileusis; HTN hypertension; DM  diabetes mellitus; COPD chronic obstructive pulmonary disease

## 2024-05-19 ENCOUNTER — Other Ambulatory Visit (HOSPITAL_COMMUNITY): Payer: Self-pay

## 2024-05-20 ENCOUNTER — Encounter: Payer: Self-pay | Admitting: Physician Assistant

## 2024-05-20 ENCOUNTER — Ambulatory Visit (INDEPENDENT_AMBULATORY_CARE_PROVIDER_SITE_OTHER): Admitting: Physician Assistant

## 2024-05-20 ENCOUNTER — Other Ambulatory Visit: Payer: Self-pay

## 2024-05-20 DIAGNOSIS — L84 Corns and callosities: Secondary | ICD-10-CM

## 2024-05-20 DIAGNOSIS — Z89431 Acquired absence of right foot: Secondary | ICD-10-CM | POA: Diagnosis not present

## 2024-05-20 NOTE — Progress Notes (Signed)
 Office Visit Note   Patient: William [name]           Date of Birth: 1951-03-04           MRN: [id number] Visit Date: 05/20/2024              Requested by: Joshua Debby CROME, MD [address] [place],  [place] [address] PCP: Joshua Debby CROME, MD  Chief Complaint  Patient presents with   Right Foot - Wound Check      HPI: 73 y/o male s/p right TMA with callus on the plantar lateral foot.  He denies new wounds.  His daughter is with him.  They are trying to relieve the pressure with multiple different inserts in the shoe.  He does have diabetic shoes that are comfortable.    Assessment & Plan: Visit Diagnoses:  1. History of transmetatarsal amputation of right foot ([name])   2. Foot callus     Plan: He tolerated the callus trimming well.  He has a metatarsal pad that he will try to take the pressure off the lateral plantar residual foot.    Follow-Up Instructions: Return if symptoms worsen or fail to improve, for callus or nail trims.SABRA Beers Exam  Patient is alert, oriented, no adenopathy, well-dressed, normal affect, normal respiratory effort. After verbal consent I trimmed the plantar callus with a 10 blade to soft skin.  There is no open wound at the base of the callus.      Imaging: X ray showed no bone destruction and no prominence to indicate cause of callus..  Labs: Lab Results  Component Value Date   HGBA1C 6.8 (H) 04/29/2024   HGBA1C 6.5 (A) 11/07/2023   HGBA1C 6.5 08/19/2023   CRP 0.7 04/29/2024   REPTSTATUS 06/09/2023 FINAL 06/04/2023   GRAMSTAIN  03/14/2016    RARE WBC PRESENT, PREDOMINANTLY PMN NO ORGANISMS SEEN    CULT  06/04/2023    NO GROWTH 5 DAYS Performed at [facility], [address] [place], [place] [address]    LABORGA PSEUDOMONAS AERUGINOSA 03/14/2016     Lab Results  Component Value Date   ALBUMIN  4.9 04/29/2024   ALBUMIN  3.8 10/01/2023   ALBUMIN  3.4 (L) 06/06/2023    Lab Results  Component Value Date   MG 2.3  06/22/2016   MG 2.2 03/30/2016   MG 1.7 03/23/2016   No results found for: VD25OH  No results found for: PREALBUMIN    Latest Ref Rng & Units 04/29/2024    3:13 PM 01/14/2024    1:58 PM 10/01/2023    6:31 PM  CBC EXTENDED  WBC 4.0 - 10.5 K/uL 6.1  9.7    RBC 4.22 - 5.81 Mil/uL 4.83  4.70    Hemoglobin 13.0 - 17.0 g/dL 85.5  85.8  87.3   HCT 39.0 - 52.0 % 42.0  40.7  37.0   Platelets 150.0 - 400.0 K/uL 180.0  176.0    NEUT# 1.4 - 7.7 K/uL 4.0  7.6    Lymph# 0.7 - 4.0 K/uL 1.6  1.6       There is no height or weight on file to calculate BMI.  Orders:  Orders Placed This Encounter  Procedures   XR Foot Complete Right   No orders of the defined types were placed in this encounter.    Procedures: No procedures performed  Clinical Data: No additional findings.  ROS:  All other systems negative, except as noted in the HPI.  Review of Systems  Objective: Vital Signs: There were no vitals taken for this visit.  Specialty Comments:  No specialty comments available.  PMFS History: Patient Active Problem List   Diagnosis Date Noted   Exocrine pancreatic insufficiency 05/11/2024   LRTI (lower respiratory tract infection) 05/11/2024   Gallstones 05/05/2024   Subacute cough 04/29/2024   Generalized abdominal tenderness without rebound tenderness 04/29/2024   Polyp of colon 04/29/2024   High alkaline phosphatase 04/29/2024   Weight loss, non-intentional 04/29/2024   Long term (current) use of anticoagulants 03/30/2024   Need for immunization against influenza 04/29/2023   Asymptomatic microscopic hematuria 04/29/2023   Tinea pedis of left foot 01/28/2023   Mild nonproliferative diabetic retinopathy of both eyes without macular edema associated with type 2 diabetes mellitus ([name]) 05/09/2022   Prostate cancer screening 05/09/2022   Encounter for general adult medical examination with abnormal findings 11/28/2020   Insulin -requiring or dependent type II diabetes  mellitus ([name]) 11/22/2020   Hyperlipidemia LDL goal <70 11/22/2020   Tobacco dependence 03/24/2017   Type 2 diabetes mellitus with stage 3b chronic kidney disease, without long-term current use of insulin  ([name]) 09/17/2016   Peripheral vascular disorder 09/17/2016   CKD stage 3b, GFR 30-44 ml/min ([name])    Chronic heart failure with mildly reduced ejection fraction (HFmrEF, 41-49%) ([name]) 05/28/2016   Coronary artery disease due to lipid rich plaque 03/16/2016   VSD (ventricular septal defect) 03/16/2016   Paroxysmal atrial fibrillation ([name]) 03/15/2016   Erectile dysfunction associated with type 2 diabetes mellitus ([name]) 04/07/2012   Mood disorder ([name]) 03/12/2011   Essential hypertension 03/17/2010   GERD 11/01/2008   Past Medical History:  Diagnosis Date   AKI (acute kidney injury)    With STEMI in 2017   Chronic systolic CHF (congestive heart failure) ([name])    Depression    Diabetes mellitus without complication ([name])    Diabetic retinopathy ([name])    GERD (gastroesophageal reflux disease)    Hx of adenomatous colonic polyps 04/07/2018   Hyperlipidemia    Hypertension    Hypertensive retinopathy    Paroxysmal atrial fibrillation ([name])    Peripheral vascular disease    Pneumonia    Seizures ([name])    hx of as a child   STEMI (ST elevation myocardial infarction) ([name]) 2017    Family History  Problem Relation Age of Onset   Diabetes Maternal Grandmother    Diabetes Mother    Aneurysm Mother    Peripheral Artery Disease Mother    Coronary artery disease Mother    Peptic Ulcer Father    Retinoblastoma Daughter    Colon cancer Neg Hx    Rectal cancer Neg Hx     Past Surgical History:  Procedure Laterality Date   ABDOMINAL AORTOGRAM W/LOWER EXTREMITY N/A 09/19/2016   Procedure: Abdominal Aortogram w/Lower Extremity;  Surgeon: Deatrice DELENA Cage, MD;  Location: [facility];  Service: Cardiovascular;  Laterality: N/A;   ABDOMINAL AORTOGRAM W/LOWER EXTREMITY N/A 07/17/2023    Procedure: ABDOMINAL AORTOGRAM W/LOWER EXTREMITY;  Surgeon: Cage Deatrice DELENA, MD;  Location: [facility];  Service: Cardiovascular;  Laterality: N/A;   AMPUTATION Right 03/23/2016   Procedure: 1st and 2nd Ray Amputation Right Foot;  Surgeon: Jerona LULLA Sage, MD;  Location: [facility] OR;  Service: Orthopedics;  Laterality: Right;   AMPUTATION Right 06/21/2016   Procedure: RIGHT TRANSMETATARSAL AMPUTATION;  Surgeon: Jerona LULLA Sage, MD;  Location: [facility] OR;  Service: Orthopedics;  Laterality: Right;   CARDIAC CATHETERIZATION  N/A 03/13/2016   Procedure: Right/Left Heart Cath and Coronary Angiography;  Surgeon: Ozell Fell, MD;  Location: [facility] CV LAB;  Service: Cardiovascular;  Laterality: N/A;   CARDIAC CATHETERIZATION N/A 03/13/2016   Procedure: IABP Insertion;  Surgeon: Ozell Fell, MD;  Location: [facility] INVASIVE CV LAB;  Service: Cardiovascular;  Laterality: N/A;   CATARACT EXTRACTION     CATARACT EXTRACTION, BILATERAL     CERVICAL FUSION  1982, 1992   has had 3 neck surgeries from breaking his neck   CIRCUMCISION N/A 11/07/2021   Procedure: CIRCUMCISION ADULT;  Surgeon: Lovie Arlyss CROME, MD;  Location: [facility] ORS;  Service: Urology;  Laterality: N/A;   CORONARY ARTERY BYPASS GRAFT N/A 03/13/2016   Procedure: CORONARY ARTERY BYPASS GRAFTING (CABG) x 1 (SVG to OM) with EVH from LEFT GREATER SAPHENOUS VEIN;  Surgeon: Maude Fleeta Ochoa, MD;  Location: [facility] OR;  Service: Open Heart Surgery;  Laterality: N/A;   EYE SURGERY     LOWER EXTREMITY ANGIOGRAM  05/02/2016   Procedure: Lower Extremity Angiogram;  Surgeon: Deatrice DELENA Cage, MD;  Location: [facility];  Service: Cardiovascular;;  Limited left femoral runoff right femoral runoff   PERIPHERAL VASCULAR BALLOON ANGIOPLASTY  07/17/2023   Procedure: PERIPHERAL VASCULAR BALLOON ANGIOPLASTY;  Surgeon: Cage Deatrice DELENA, MD;  Location: [facility];  Service: Cardiovascular;;   PERIPHERAL VASCULAR CATHETERIZATION N/A 05/02/2016   Procedure:  Abdominal Aortogram;  Surgeon: Deatrice DELENA Cage, MD;  Location: [facility];  Service: Cardiovascular;  Laterality: N/A;   PERIPHERAL VASCULAR CATHETERIZATION Right 05/02/2016   Procedure: Peripheral Vascular Balloon Angioplasty;  Surgeon: Deatrice DELENA Cage, MD;  Location: [facility];  Service: Cardiovascular;  Laterality: Right;  SFA   TEE WITHOUT CARDIOVERSION N/A 03/13/2016   Procedure: TRANSESOPHAGEAL ECHOCARDIOGRAM (TEE);  Surgeon: Maude Fleeta Ochoa, MD;  Location: [facility] OR;  Service: Open Heart Surgery;  Laterality: N/A;   VSD REPAIR N/A 03/13/2016   Procedure: VENTRICULAR SEPTAL DEFECT (VSD) REPAIR;  Surgeon: Maude Fleeta Ochoa, MD;  Location: [facility] OR;  Service: Open Heart Surgery;  Laterality: N/A;   Social History   Occupational History   Occupation: RETIRED  Tobacco Use   Smoking status: Former    Current packs/day: 0.00    Types: Cigarettes    Quit date: 11/20/2015    Years since quitting: 8.5   Smokeless tobacco: Never   Tobacco comments:    quit 2018  Vaping Use   Vaping status: Never Used  Substance and Sexual Activity   Alcohol use: No   Drug use: No   Sexual activity: Not Currently

## 2024-05-25 ENCOUNTER — Ambulatory Visit: Payer: Self-pay | Admitting: Surgery

## 2024-05-25 NOTE — Progress Notes (Signed)
 Surgery orders requested via Epic inbox.

## 2024-05-26 NOTE — Progress Notes (Addendum)
 Date of COVID positive in last 90 days:  PCP - Debby Molt, MD Cardiologist - Deatrice Cage, MD  Moderate risk from cardiac standpoint  Chest x-ray - 01-14-24 Epic EKG - 04-29-24 Epic Stress Test - N/A ECHO - 08-20-23 Epic Cardiac Cath - N/A Pacemaker/ICD device last checked:N/A Spinal Cord Stimulator:N/A  Bowel Prep - N/A  Sleep Study - N/A CPAP -   A1c 6.8 04-29-24 Fasting Blood Sugar - N/A Checks Blood Sugar _____ times a day  Farxiga  Last dose of GLP1 agonist-  N/A GLP1 instructions:  Do not take after 05-29-24   Ozempic  Last dose of SGLT-2 inhibitors-  N/A SGLT-2 instructions:  Do not take after 05-25-24  Blood Thinner Instructions: Coumadin  (hold x5 days) Last dose:   Time: Aspirin  Instructions:ASA 81 (continue per clearance) Last Dose:  Activity level:  Can go up a flight of stairs and perform activities of daily living without stopping and without symptoms of chest pain or shortness of breath.  Able to exercise without symptoms  Unable to go up a flight of stairs without symptoms of     Anesthesia review: Afib, Hx of MI, HTN, DM, VSD, CHF, CKD  Respiratory infection 05/2024  Patient denies shortness of breath, fever, cough and chest pain at PAT appointment  Patient verbalized understanding of instructions that were given to them at the PAT appointment. Patient was also instructed that they will need to review over the PAT instructions again at home before surgery.

## 2024-05-26 NOTE — Patient Instructions (Addendum)
 SURGICAL WAITING ROOM VISITATION Patients having surgery or a procedure may have no more than 2 support people in the waiting area - these visitors may rotate.    Children under the age of 52 must have an adult with them who is not the patient.  If the patient needs to stay at the hospital during part of their recovery, the visitor guidelines for inpatient rooms apply. Pre-op nurse will coordinate an appropriate time for 1 support person to accompany patient in pre-op.  This support person may not rotate.    Please refer to the [facility] website for the visitor guidelines for Inpatients (after your surgery is over and you are in a regular room).       Your procedure is scheduled on: 06-02-24   Report to [facility] Main Entrance    Report to admitting at 1:30 PM   Call this number if you have problems the morning of surgery [phone]   Do not eat food :After Midnight.   After Midnight you may have the following liquids until 12:30 PM DAY OF SURGERY  Water Non-Citrus Juices (without pulp, NO RED-Apple, White grape, White cranberry) Black Coffee (NO MILK/CREAM OR CREAMERS, sugar ok)  Clear Tea (NO MILK/CREAM OR CREAMERS, sugar ok) regular and decaf                             Plain Jell-O (NO RED)                                           Fruit ices (not with fruit pulp, NO RED)                                     Popsicles (NO RED)                                                               Sports drinks like Gatorade (NO RED)                      If you have questions, please contact your surgeon's office.   FOLLOW  ANY ADDITIONAL PRE OP INSTRUCTIONS YOU RECEIVED FROM YOUR SURGEON'S OFFICE!!!     Oral Hygiene is also important to reduce your risk of infection.                                    Remember - BRUSH YOUR TEETH THE MORNING OF SURGERY WITH YOUR REGULAR TOOTHPASTE   Do NOT smoke after Midnight   Take these medicines the morning of surgery with A SIP  OF WATER:    Gabapentin    Pantoprazole    Rosuvastatin    Vascepa    Tylenol  if needed    Stop all vitamins and herbal supplements 7 days before surgery  Coumadin  -  Hold 5 days prior to surgery (do not take after 05-27-24)  How to Manage Your Diabetes Before and After Surgery  Why is it important to  control my blood sugar before and after surgery? Improving blood sugar levels before and after surgery helps healing and can limit problems. A way of improving blood sugar control is eating a healthy diet by:  Eating less sugar and carbohydrates  Increasing activity/exercise  Talking with your doctor about reaching your blood sugar goals High blood sugars (greater than 180 mg/dL) can raise your risk of infections and slow your recovery, so you will need to focus on controlling your diabetes during the weeks before surgery. Make sure that the doctor who takes care of your diabetes knows about your planned surgery including the date and location.  How do I manage my blood sugar before surgery? Check your blood sugar at least 4 times a day, starting 2 days before surgery, to make sure that the level is not too high or low. Check your blood sugar the morning of your surgery when you wake up and every 2 hours until you get to the Short Stay unit. If your blood sugar is less than 70 mg/dL, you will need to treat for low blood sugar: Do not take insulin . Treat a low blood sugar (less than 70 mg/dL) with  cup of clear juice (cranberry or apple), 4 glucose tablets, OR glucose gel. Recheck blood sugar in 15 minutes after treatment (to make sure it is greater than 70 mg/dL). If your blood sugar is not greater than 70 mg/dL on recheck, call [phone] for further instructions. Report your blood sugar to the short stay nurse when you get to Short Stay.  If you are admitted to the hospital after surgery: Your blood sugar will be checked by the staff and you will probably be given insulin  after surgery  (instead of oral diabetes medicines) to make sure you have good blood sugar levels. The goal for blood sugar control after surgery is 80-180 mg/dL.   WHAT DO I DO ABOUT MY DIABETES MEDICATION?  Do not take oral diabetes medicines (pills) the morning of surgery.        Hold Farxiga  3 days before surgery (do not take after 05-29-24)        Hold Ozempic  7 days before surgery (do not take after 05-25-24)  THE NIGHT BEFORE SURGERY, take 20 units of Lantus   insulin .   DO NOT TAKE THE FOLLOWING 7 DAYS PRIOR TO SURGERY: Ozempic , Wegovy , Rybelsus  (Semaglutide ), Byetta (exenatide), Bydureon (exenatide ER), Victoza, Saxenda (liraglutide), or Trulicity (dulaglutide) Mounjaro (Tirzepatide) Adlyxin (Lixisenatide), Polyethylene Glycol Loxenatide.  Reviewed and Endorsed by [facility] Patient Education Committee, August 2015  Bring CPAP mask and tubing day of surgery.                              You may not have any metal on your body including jewelry, and body piercing             Do not wear lotions, powders, cologne, or deodorant              Men may shave face and neck.   Do not bring valuables to the hospital. [facility] IS NOT RESPONSIBLE   FOR VALUABLES.   Contacts, dentures or bridgework may not be worn into surgery.  DO NOT BRING YOUR HOME MEDICATIONS TO THE HOSPITAL. PHARMACY WILL DISPENSE MEDICATIONS LISTED ON YOUR MEDICATION LIST TO YOU DURING YOUR ADMISSION IN THE HOSPITAL!    Patients discharged on the day of surgery will not be allowed to drive home.  Someone NEEDS to stay with you for the first 24 hours after anesthesia.   Special Instructions: Bring a copy of your healthcare power of attorney and living will documents the day of surgery if you haven't scanned them before.              Please read over the following fact sheets you were given: IF YOU HAVE QUESTIONS ABOUT YOUR PRE-OP INSTRUCTIONS PLEASE CALL [phone] [facility]  If you received a COVID test during your pre-op  visit  it is requested that you wear a mask when out in public, stay away from anyone that may not be feeling well and notify your surgeon if you develop symptoms. If you test positive for Covid or have been in contact with anyone that has tested positive in the last 10 days please notify you surgeon.  [facility] - Preparing for Surgery Before surgery, you can play an important role.  Because skin is not sterile, your skin needs to be as free of germs as possible.  You can reduce the number of germs on your skin by washing with CHG (chlorahexidine gluconate) soap before surgery.  CHG is an antiseptic cleaner which kills germs and bonds with the skin to continue killing germs even after washing. Please DO NOT use if you have an allergy to CHG or antibacterial soaps.  If your skin becomes reddened/irritated stop using the CHG and inform your nurse when you arrive at Short Stay. Do not shave (including legs and underarms) for at least 48 hours prior to the first CHG shower.  You may shave your face/neck.  Please follow these instructions carefully:  1.  Shower with CHG Soap the night before surgery ONLY (DO NOT USE THE SOAP THE MORNING OF SURGERY).  2.  If you choose to wash your hair, wash your hair first as usual with your normal  shampoo.  3.  After you shampoo, rinse your hair and body thoroughly to remove the shampoo.                             4.  Use CHG as you would any other liquid soap.  You can apply chg directly to the skin and wash.  Gently with a scrungie or clean washcloth.  5.  Apply the CHG Soap to your body ONLY FROM THE NECK DOWN.   Do   not use on face/ open                           Wound or open sores. Avoid contact with eyes, ears mouth and   genitals (private parts).                       Wash face,  Genitals (private parts) with your normal soap.             6.  Wash thoroughly, paying special attention to the area where your    surgery  will be performed.  7.  Thoroughly  rinse your body with warm water from the neck down.  8.  DO NOT shower/wash with your normal soap after using and rinsing off the CHG Soap.                9.  Pat yourself dry with a clean towel.            10.  Wear clean  pajamas.            11.  Place clean sheets on your bed the night of your first shower and do not  sleep with pets. Day of Surgery : Do not apply any CHG, lotions/deodorants the morning of surgery.  Please wear clean clothes to the hospital/surgery center.  FAILURE TO FOLLOW THESE INSTRUCTIONS MAY RESULT IN THE CANCELLATION OF YOUR SURGERY  PATIENT SIGNATURE_________________________________  NURSE SIGNATURE__________________________________  ________________________________________________________________________

## 2024-05-27 ENCOUNTER — Encounter (HOSPITAL_COMMUNITY): Payer: Self-pay

## 2024-05-27 ENCOUNTER — Encounter (HOSPITAL_COMMUNITY)
Admission: RE | Admit: 2024-05-27 | Discharge: 2024-05-27 | Disposition: A | Discharge status: Home / Self Care | Source: Ambulatory Visit | Attending: Surgery | Admitting: Surgery

## 2024-05-27 ENCOUNTER — Other Ambulatory Visit: Payer: Self-pay

## 2024-05-27 VITALS — BP 145/62 | HR 66 | Temp 97.7°F | Resp 16 | Ht 65.0 in | Wt 165.2 lb

## 2024-05-27 DIAGNOSIS — I509 Heart failure, unspecified: Secondary | ICD-10-CM | POA: Diagnosis not present

## 2024-05-27 DIAGNOSIS — Z01818 Encounter for other preprocedural examination: Secondary | ICD-10-CM

## 2024-05-27 DIAGNOSIS — Z794 Long term (current) use of insulin: Secondary | ICD-10-CM | POA: Diagnosis not present

## 2024-05-27 DIAGNOSIS — E119 Type 2 diabetes mellitus without complications: Secondary | ICD-10-CM | POA: Diagnosis not present

## 2024-05-27 DIAGNOSIS — I480 Paroxysmal atrial fibrillation: Secondary | ICD-10-CM | POA: Diagnosis not present

## 2024-05-27 DIAGNOSIS — Z95820 Peripheral vascular angioplasty status with implants and grafts: Secondary | ICD-10-CM | POA: Insufficient documentation

## 2024-05-27 DIAGNOSIS — Z01812 Encounter for preprocedural laboratory examination: Secondary | ICD-10-CM | POA: Diagnosis not present

## 2024-05-27 DIAGNOSIS — I2510 Atherosclerotic heart disease of native coronary artery without angina pectoris: Secondary | ICD-10-CM | POA: Diagnosis not present

## 2024-05-27 DIAGNOSIS — I110 Hypertensive heart disease with heart failure: Secondary | ICD-10-CM | POA: Diagnosis not present

## 2024-05-27 DIAGNOSIS — K8010 Calculus of gallbladder with chronic cholecystitis without obstruction: Secondary | ICD-10-CM | POA: Insufficient documentation

## 2024-05-27 DIAGNOSIS — Z951 Presence of aortocoronary bypass graft: Secondary | ICD-10-CM | POA: Insufficient documentation

## 2024-05-27 DIAGNOSIS — Z7901 Long term (current) use of anticoagulants: Secondary | ICD-10-CM | POA: Insufficient documentation

## 2024-05-27 HISTORY — DX: Unspecified osteoarthritis, unspecified site: M19.90

## 2024-05-27 LAB — BASIC METABOLIC PANEL WITH GFR
Anion gap: 12 (ref 5–15)
BUN: 34 mg/dL — ABNORMAL HIGH (ref 8–23)
CO2: 24 mmol/L (ref 22–32)
Calcium: 9.9 mg/dL (ref 8.9–10.3)
Chloride: 101 mmol/L (ref 98–111)
Creatinine, Ser: 1.63 mg/dL — ABNORMAL HIGH (ref 0.61–1.24)
GFR, Estimated: 44 mL/min — ABNORMAL LOW (ref >60)
Glucose, Bld: 103 mg/dL — ABNORMAL HIGH (ref 70–99)
Potassium: 4.3 mmol/L (ref 3.5–5.1)
Sodium: 137 mmol/L (ref 135–145)

## 2024-05-27 LAB — CBC
HCT: 42.8 % (ref 39.0–52.0)
Hemoglobin: 14.2 g/dL (ref 13.0–17.0)
MCH: 30.1 pg (ref 26.0–34.0)
MCHC: 33.2 g/dL (ref 30.0–36.0)
MCV: 90.9 fL (ref 80.0–100.0)
Platelets: 167 K/uL (ref 150–400)
RBC: 4.71 MIL/uL (ref 4.22–5.81)
RDW: 14.1 % (ref 11.5–15.5)
WBC: 6.5 K/uL (ref 4.0–10.5)
nRBC: 0 % (ref 0.0–0.2)

## 2024-05-27 LAB — GLUCOSE, CAPILLARY: Glucose-Capillary: 101 mg/dL — ABNORMAL HIGH (ref 70–99)

## 2024-06-01 ENCOUNTER — Ambulatory Visit: Attending: Cardiovascular Disease

## 2024-06-01 ENCOUNTER — Encounter (INDEPENDENT_AMBULATORY_CARE_PROVIDER_SITE_OTHER): Payer: Self-pay | Admitting: Ophthalmology

## 2024-06-01 ENCOUNTER — Ambulatory Visit (INDEPENDENT_AMBULATORY_CARE_PROVIDER_SITE_OTHER): Admitting: Ophthalmology

## 2024-06-01 DIAGNOSIS — H35033 Hypertensive retinopathy, bilateral: Secondary | ICD-10-CM

## 2024-06-01 DIAGNOSIS — Z961 Presence of intraocular lens: Secondary | ICD-10-CM

## 2024-06-01 DIAGNOSIS — E113313 Type 2 diabetes mellitus with moderate nonproliferative diabetic retinopathy with macular edema, bilateral: Secondary | ICD-10-CM | POA: Diagnosis not present

## 2024-06-01 DIAGNOSIS — Z794 Long term (current) use of insulin: Secondary | ICD-10-CM

## 2024-06-01 DIAGNOSIS — I10 Essential (primary) hypertension: Secondary | ICD-10-CM | POA: Diagnosis not present

## 2024-06-01 DIAGNOSIS — I480 Paroxysmal atrial fibrillation: Secondary | ICD-10-CM | POA: Diagnosis not present

## 2024-06-01 DIAGNOSIS — Z7985 Long-term (current) use of injectable non-insulin antidiabetic drugs: Secondary | ICD-10-CM

## 2024-06-01 DIAGNOSIS — Z7901 Long term (current) use of anticoagulants: Secondary | ICD-10-CM

## 2024-06-01 LAB — POCT INR: INR: 1.1 — AB (ref 2.0–3.0)

## 2024-06-01 MED ORDER — BEVACIZUMAB CHEMO INJECTION 1.25MG/0.05ML SYRINGE FOR KALEIDOSCOPE
1.2500 mg | INTRAVITREAL | Status: AC | PRN
  Administered 2024-06-01: 1.25 mg via INTRAVITREAL

## 2024-06-01 NOTE — Patient Instructions (Signed)
 Held 11/27-12/1 for Gall Bladder surgery 12/2 then Continue taking 1.5 tablets daily except for 1 tablet every Mondays and Fridays. Recheck INR in 1 week. Stay consistent with [facility] [phone]

## 2024-06-01 NOTE — Progress Notes (Signed)
 Anesthesia Chart Review   Case: [id number] Date/Time: 06/02/24 1527   Procedure: LAPAROSCOPIC CHOLECYSTECTOMY WITH INTRAOPERATIVE CHOLANGIOGRAM   Anesthesia type: General   Diagnosis: Calculus of gallbladder with chronic cholecystitis without obstruction [K80.10]   Pre-op diagnosis: GALLSTONES   Location: [facility]OM 02 / [facility]   Surgeons: Lyndel Deward PARAS, MD       DISCUSSION:73 y.o. former smoker with h/o HTN, HTN, PAF on Warfarin, CAD s/p CABG and VSD repair 2017, CHF, DM II, gallstones scheduled for above procedure 06/02/2024 with Dr. Deward Lyndel.   He is status post drug-coated balloon angioplasty of the right SFA in 2017.  Most recently, he had left SFA drug-coated balloon angioplasty with good results and improvement in AB  Per cardiology preoperative evaluation 05/15/2024, Chart reviewed as part of pre-operative protocol coverage. Patient was recently seen by Dr. Darron on 05/12/2024 and mentioned potential need for a cholecystectomy. Per Dr. Renato note: Preop cardiovascular evaluation for possible cholecystectomy: He is at a moderate risk from a cardiac standpoint.  Does not require ischemic cardiac evaluation given stable symptoms and EKG.  Warfarin can be held 5 days before with no need for bridging.  If possible, aspirin  81 mg once daily should be continued.  VS: BP (!) 145/62   Pulse 66   Temp 36.5 C (Oral)   Resp 16   Ht 5' 5 (1.651 m)   Wt 74.9 kg   SpO2 99%   BMI 27.49 kg/m   PROVIDERS: Joshua Debby CROME, MD is PCP   Cardiologist - Deatrice Darron, MD  LABS: Labs reviewed: Acceptable for surgery. (all labs ordered are listed, but only abnormal results are displayed)  Labs Reviewed  BASIC METABOLIC PANEL WITH GFR - Abnormal; Notable for the following components:      Result Value   Glucose, Bld 103 (*)    BUN 34 (*)    Creatinine, Ser 1.63 (*)    GFR, Estimated 44 (*)    All other components within normal limits  GLUCOSE, CAPILLARY - Abnormal;  Notable for the following components:   Glucose-Capillary 101 (*)    All other components within normal limits  CBC     IMAGES:   EKG:   CV: Echo 08/20/2023 1. Akinesis of the inferior and inferosepal walls with overall mild to  moderate LV dysfunction.   2. Left ventricular ejection fraction, by estimation, is 40 to 45%. The  left ventricle has mild to moderately decreased function. The left  ventricle demonstrates regional wall motion abnormalities (see scoring  diagram/findings for description). The left   ventricular internal cavity size was mildly dilated. Left ventricular  diastolic parameters are consistent with Grade I diastolic dysfunction  (impaired relaxation). Elevated left atrial pressure.   3. Right ventricular systolic function is normal. The right ventricular  size is normal. Tricuspid regurgitation signal is inadequate for assessing  PA pressure.   4. Left atrial size was mildly dilated.   5. The mitral valve is normal in structure. Mild mitral valve  regurgitation. No evidence of mitral stenosis.   6. The aortic valve is tricuspid. Aortic valve regurgitation is mild.  Aortic valve sclerosis is present, with no evidence of aortic valve  stenosis.   7. The inferior vena cava is normal in size with greater than 50%  respiratory variability, suggesting right atrial pressure of 3 mmHg.  Past Medical History:  Diagnosis Date   AKI (acute kidney injury)    With STEMI in 2017   Arthritis  Chronic systolic CHF (congestive heart failure) ([name])    Depression    Diabetes mellitus without complication ([name])    Diabetic retinopathy ([name])    GERD (gastroesophageal reflux disease)    Hx of adenomatous colonic polyps 04/07/2018   Hyperlipidemia    Hypertension    Hypertensive retinopathy    Paroxysmal atrial fibrillation ([name])    Peripheral vascular disease    Pneumonia    Seizures ([name])    hx of as a child   STEMI (ST elevation myocardial infarction) ([name])  2017    Past Surgical History:  Procedure Laterality Date   ABDOMINAL AORTOGRAM W/LOWER EXTREMITY N/A 09/19/2016   Procedure: Abdominal Aortogram w/Lower Extremity;  Surgeon: Deatrice DELENA Cage, MD;  [facility];  Service: Cardiovascular;  Laterality: N/A;   ABDOMINAL AORTOGRAM W/LOWER EXTREMITY N/A 07/17/2023   Procedure: ABDOMINAL AORTOGRAM W/LOWER EXTREMITY;  Surgeon: Cage Deatrice DELENA, MD;  Location: [facility];  Service: Cardiovascular;  Laterality: N/A;   AMPUTATION Right 03/23/2016   Procedure: 1st and 2nd Ray Amputation Right Foot;  Surgeon: Jerona LULLA Sage, MD;  Location: [facility] OR;  Service: Orthopedics;  Laterality: Right;   AMPUTATION Right 06/21/2016   Procedure: RIGHT TRANSMETATARSAL AMPUTATION;  Surgeon: Jerona LULLA Sage, MD;  Location: [facility];  Service: Orthopedics;  Laterality: Right;   CARDIAC CATHETERIZATION N/A 03/13/2016   Procedure: Right/Left Heart Cath and Coronary Angiography;  Surgeon: Ozell Fell, MD;  Location: [facility];  Service: Cardiovascular;  Laterality: N/A;   CARDIAC CATHETERIZATION N/A 03/13/2016   Procedure: IABP Insertion;  Surgeon: Ozell Fell, MD;  Location: [facility] INVASIVE CV LAB;  Service: Cardiovascular;  Laterality: N/A;   CATARACT EXTRACTION     CATARACT EXTRACTION, BILATERAL     CERVICAL FUSION  1982, 1992   has had 3 neck surgeries from breaking his neck   CIRCUMCISION N/A 11/07/2021   Procedure: CIRCUMCISION ADULT;  Surgeon: Lovie Arlyss CROME, MD;  [facility];  Service: Urology;  Laterality: N/A;   CORONARY ARTERY BYPASS GRAFT N/A 03/13/2016   Procedure: CORONARY ARTERY BYPASS GRAFTING (CABG) x 1 (SVG to OM) with EVH from LEFT GREATER SAPHENOUS VEIN;  Surgeon: Maude Fleeta Ochoa, MD;  Location: [facility];  Service: Open Heart Surgery;  Laterality: N/A;   EYE SURGERY     LOWER EXTREMITY ANGIOGRAM  05/02/2016   Procedure: Lower Extremity Angiogram;  Surgeon: Deatrice DELENA Cage, MD;  [facility];  Service:  Cardiovascular;;  Limited left femoral runoff right femoral runoff   PERIPHERAL VASCULAR BALLOON ANGIOPLASTY  07/17/2023   Procedure: PERIPHERAL VASCULAR BALLOON ANGIOPLASTY;  Surgeon: Cage Deatrice DELENA, MD;  [facility];  Service: Cardiovascular;;   PERIPHERAL VASCULAR CATHETERIZATION N/A 05/02/2016   Procedure: Abdominal Aortogram;  Surgeon: Deatrice DELENA Cage, MD;  [facility];  Service: Cardiovascular;  Laterality: N/A;   PERIPHERAL VASCULAR CATHETERIZATION Right 05/02/2016   Procedure: Peripheral Vascular Balloon Angioplasty;  Surgeon: Deatrice DELENA Cage, MD;  [facility];  Service: Cardiovascular;  Laterality: Right;  SFA   TEE WITHOUT CARDIOVERSION N/A 03/13/2016   Procedure: TRANSESOPHAGEAL ECHOCARDIOGRAM (TEE);  Surgeon: Maude Fleeta Ochoa, MD;  Location: [facility] OR;  Service: Open Heart Surgery;  Laterality: N/A;   VSD REPAIR N/A 03/13/2016   Procedure: VENTRICULAR SEPTAL DEFECT (VSD) REPAIR;  Surgeon: Maude Fleeta Ochoa, MD;  Location: [facility] OR;  Service: Open Heart Surgery;  Laterality: N/A;    MEDICATIONS:  acetaminophen  (TYLENOL ) 500 MG tablet  aspirin  EC 81 MG tablet   Blood Glucose Monitoring Suppl DEVI   carvedilol  (COREG ) 3.125 MG tablet   Continuous Glucose Receiver (DEXCOM G7 RECEIVER) DEVI   Continuous Glucose Sensor (DEXCOM G7 SENSOR) MISC   dapagliflozin  propanediol (FARXIGA ) 10 MG TABS tablet   gabapentin  (NEURONTIN ) 300 MG capsule   glucose blood (ACCU-CHEK GUIDE TEST) test strip   icosapent  Ethyl (VASCEPA ) 1 g capsule   insulin  glargine (LANTUS  SOLOSTAR) 100 UNIT/ML Solostar Pen   Insulin  Glargine Solostar (LANTUS ) 100 UNIT/ML Solostar Pen   Insulin  Pen Needle 31G X 8 MM MISC   Insulin  Pen Needle 31G X 8 MM MISC   Lancets Misc. (ACCU-CHEK FASTCLIX LANCET) KIT   lipase/protease/amylase (CREON ) 36000 UNITS CPEP capsule   OZEMPIC , 0.25 OR 0.5 MG/DOSE, 2 MG/3ML SOPN   pantoprazole  (PROTONIX ) 40 MG tablet   pramipexole  (MIRAPEX )  0.125 MG tablet   rosuvastatin  (CRESTOR ) 40 MG tablet   torsemide  (DEMADEX ) 20 MG tablet   warfarin (COUMADIN ) 5 MG tablet   No current facility-administered medications for this encounter.     Harlene Hoots Ward, PA-C [facility] [phone]

## 2024-06-01 NOTE — Progress Notes (Signed)
 INR 1.1 Please see anticoagulation encounter Held 11/27-12/1 for Gall Bladder surgery 12/2 then Continue taking 1.5 tablets daily except for 1 tablet every Mondays and Fridays. Recheck INR in 1 week. [facility] with [facility] [phone]

## 2024-06-01 NOTE — Progress Notes (Signed)
 [facility]  PROCEDURAL EXPEDITER PROGRESS NOTE  Patient Name: William [name]  DOB:[birth year] Date of Admission: (Not on file)  Date of Assessment:06/01/24   -------------------------------------------------------------------------------------------------------------------   Brief clinical summary: 81 yr oldmale with a hx of HTN,type 2 DM,S/PCABG and VSD repair 2017 and CHF.Having cholecystectomy laparoscopic on12/08/2023  Orders in place:  Yes   Communication with surgical team if no orders: n/a  Labs, test, and orders reviewed: Yes   Requires surgical clearance: Yes  What type of clearance: cardiac and blood thinner  Clearance received: 05/15/2025  Barriers noted:stoppedwarfin 5 days before surgery   Intervention provided by [facility] team: n/a  Barrier resolved:  no, needs consent for blood refusal    -------------------------------------------------------------------------------------------------------------------  [employer], Ronal DELENA Bald Please contact us  directly via secure chat (search for [facility]) or by calling us  at [phone] [facility]).

## 2024-06-01 NOTE — Anesthesia Preprocedure Evaluation (Signed)
 Anesthesia Evaluation  Patient identified by MRN, date of birth, ID band Patient awake    Reviewed: Allergy & Precautions, NPO status , Patient's Chart, lab work & pertinent test results  History of Anesthesia Complications Negative for: history of anesthetic complications  Airway Mallampati: II  TM Distance: >3 FB Neck ROM: Full    Dental  (+) Dental Advisory Given, Edentulous Upper, Edentulous Lower   Pulmonary neg pulmonary ROS, pneumonia, former smoker   Pulmonary exam normal        Cardiovascular Exercise Tolerance: Good hypertension, Pt. on medications and Pt. on home beta blockers + CAD, + Past MI, + CABG (2017), + Peripheral Vascular Disease and +CHF  Normal cardiovascular exam+ dysrhythmias Atrial Fibrillation    CV: Echo 08/20/2023 1. Akinesis of the inferior and inferosepal walls with overall mild to  moderate LV dysfunction.   2. Left ventricular ejection fraction, by estimation, is 40 to 45%. The  left ventricle has mild to moderately decreased function. The left  ventricle demonstrates regional wall motion abnormalities (see scoring  diagram/findings for description). The left   ventricular internal cavity size was mildly dilated. Left ventricular  diastolic parameters are consistent with Grade I diastolic dysfunction  (impaired relaxation). Elevated left atrial pressure.   3. Right ventricular systolic function is normal. The right ventricular  size is normal. Tricuspid regurgitation signal is inadequate for assessing  PA pressure.   4. Left atrial size was mildly dilated.   5. The mitral valve is normal in structure. Mild mitral valve  regurgitation. No evidence of mitral stenosis.   6. The aortic valve is tricuspid. Aortic valve regurgitation is mild.  Aortic valve sclerosis is present, with no evidence of aortic valve  stenosis.   7. The inferior vena cava is normal in size with greater than 50%   respiratory variability, suggesting right atrial pressure of 3 mmHg.     Neuro/Psych Seizures -,  PSYCHIATRIC DISORDERS  Depression    negative neurological ROS     GI/Hepatic Neg liver ROS,GERD  ,,  Endo/Other  diabetes, Type 2, Insulin  Dependent    Renal/GU CRFRenal disease  negative genitourinary   Musculoskeletal negative musculoskeletal ROS (+) Arthritis ,    Abdominal   Peds  Hematology Coumadin    Anesthesia Other Findings   Reproductive/Obstetrics                              Anesthesia Physical Anesthesia Plan  ASA: 3  Anesthesia Plan: General   Post-op Pain Management:    Induction: Intravenous  PONV Risk Score and Plan: 2 and Ondansetron , Dexamethasone , Midazolam  and Treatment may vary due to age or medical condition  Airway Management Planned: LMA  Additional Equipment: None  Intra-op Plan:   Post-operative Plan: Extubation in OR  Informed Consent: I have reviewed the patients History and Physical, chart, labs and discussed the procedure including the risks, benefits and alternatives for the proposed anesthesia with the patient or authorized representative who has indicated his/her understanding and acceptance.     Dental advisory given  Plan Discussed with:   Anesthesia Plan Comments: (See PAT note 05/27/2024   DISCUSSION:73 y.o. former smoker with h/o HTN, HTN, PAF on Warfarin, CAD s/p CABG and VSD repair 2017, CHF, DM II, gallstones scheduled for above procedure 06/02/2024 with Dr. Deward Foy.    He is status post drug-coated balloon angioplasty of the right SFA in 2017.  Most recently, he had left SFA  drug-coated balloon angioplasty with good results and improvement in AB   Per cardiology preoperative evaluation 05/15/2024, Chart reviewed as part of pre-operative protocol coverage. Patient was recently seen by Dr. Darron on 05/12/2024 and mentioned potential need for a cholecystectomy. Per Dr. Renato  note: Preop cardiovascular evaluation for possible cholecystectomy: He is at a moderate risk from a cardiac standpoint.  Does not require ischemic cardiac evaluation given stable symptoms and EKG.  Warfarin can be held 5 days before with no need for bridging.  If possible, aspirin  81 mg once daily should be continued. )         Anesthesia Quick Evaluation

## 2024-06-02 ENCOUNTER — Encounter (HOSPITAL_COMMUNITY)
Admission: RE | Disposition: A | Discharge status: Home / Self Care | Payer: Self-pay | Source: Home / Self Care | Attending: Surgery

## 2024-06-02 ENCOUNTER — Ambulatory Visit (HOSPITAL_COMMUNITY)
Admission: RE | Admit: 2024-06-02 | Discharge: 2024-06-02 | Disposition: A | Discharge status: Home / Self Care | Attending: Surgery | Admitting: Surgery

## 2024-06-02 ENCOUNTER — Encounter (HOSPITAL_COMMUNITY): Payer: Self-pay | Admitting: Surgery

## 2024-06-02 ENCOUNTER — Other Ambulatory Visit: Payer: Self-pay

## 2024-06-02 ENCOUNTER — Ambulatory Visit (HOSPITAL_COMMUNITY): Payer: Self-pay | Admitting: Medical

## 2024-06-02 ENCOUNTER — Other Ambulatory Visit (HOSPITAL_COMMUNITY): Payer: Self-pay

## 2024-06-02 ENCOUNTER — Ambulatory Visit (HOSPITAL_COMMUNITY): Admitting: Anesthesiology

## 2024-06-02 ENCOUNTER — Ambulatory Visit (HOSPITAL_COMMUNITY)

## 2024-06-02 DIAGNOSIS — K8010 Calculus of gallbladder with chronic cholecystitis without obstruction: Secondary | ICD-10-CM | POA: Diagnosis not present

## 2024-06-02 DIAGNOSIS — Z7901 Long term (current) use of anticoagulants: Secondary | ICD-10-CM

## 2024-06-02 DIAGNOSIS — E119 Type 2 diabetes mellitus without complications: Secondary | ICD-10-CM

## 2024-06-02 DIAGNOSIS — I2510 Atherosclerotic heart disease of native coronary artery without angina pectoris: Secondary | ICD-10-CM

## 2024-06-02 DIAGNOSIS — I10 Essential (primary) hypertension: Secondary | ICD-10-CM | POA: Diagnosis not present

## 2024-06-02 DIAGNOSIS — Z87891 Personal history of nicotine dependence: Secondary | ICD-10-CM

## 2024-06-02 HISTORY — PX: CHOLECYSTECTOMY: SHX55

## 2024-06-02 LAB — PROTIME-INR
INR: 1 (ref 0.8–1.2)
Prothrombin Time: 13.6 s (ref 11.4–15.2)

## 2024-06-02 LAB — GLUCOSE, CAPILLARY: Glucose-Capillary: 117 mg/dL — ABNORMAL HIGH (ref 70–99)

## 2024-06-02 LAB — APTT: aPTT: 38 s — ABNORMAL HIGH (ref 24–36)

## 2024-06-02 SURGERY — LAPAROSCOPIC CHOLECYSTECTOMY WITH INTRAOPERATIVE CHOLANGIOGRAM
Anesthesia: General

## 2024-06-02 MED ORDER — MEPERIDINE HCL 25 MG/ML IJ SOLN: 6.2500 mg | INTRAMUSCULAR | Status: DC | PRN

## 2024-06-02 MED ORDER — EPHEDRINE SULFATE (PRESSORS) 25 MG/5ML IV SOSY
PREFILLED_SYRINGE | INTRAVENOUS | Status: DC | PRN
  Administered 2024-06-02 (×2): 5 mg via INTRAVENOUS

## 2024-06-02 MED ORDER — BUPIVACAINE-EPINEPHRINE (PF) 0.25% -1:200000 IJ SOLN
INTRAMUSCULAR | Status: AC
  Filled 2024-06-02: qty 30

## 2024-06-02 MED ORDER — GLYCOPYRROLATE 0.2 MG/ML IJ SOLN
INTRAMUSCULAR | Status: AC
  Filled 2024-06-02: qty 2

## 2024-06-02 MED ORDER — 0.9 % SODIUM CHLORIDE (POUR BTL) OPTIME
TOPICAL | Status: DC | PRN
  Administered 2024-06-02: 1000 mL

## 2024-06-02 MED ORDER — ONDANSETRON HCL 4 MG/2ML IJ SOLN
INTRAMUSCULAR | Status: AC
  Filled 2024-06-02: qty 2

## 2024-06-02 MED ORDER — FENTANYL CITRATE (PF) 100 MCG/2ML IJ SOLN
INTRAMUSCULAR | Status: AC
  Filled 2024-06-02: qty 2

## 2024-06-02 MED ORDER — FENTANYL CITRATE (PF) 100 MCG/2ML IJ SOLN
INTRAMUSCULAR | Status: DC | PRN
  Administered 2024-06-02 (×2): 50 ug via INTRAVENOUS

## 2024-06-02 MED ORDER — LACTATED RINGERS IR SOLN
Status: DC | PRN
  Administered 2024-06-02: 1000 mL

## 2024-06-02 MED ORDER — OXYCODONE HCL 5 MG PO TABS: 5.0000 mg | ORAL_TABLET | Freq: Once | ORAL | Status: DC | PRN

## 2024-06-02 MED ORDER — ORAL CARE MOUTH RINSE: 15.0000 mL | Freq: Once | OROMUCOSAL | Status: DC

## 2024-06-02 MED ORDER — LIDOCAINE HCL (PF) 2 % IJ SOLN
INTRAMUSCULAR | Status: DC | PRN
  Administered 2024-06-02: 100 mg via INTRADERMAL

## 2024-06-02 MED ORDER — ACETAMINOPHEN 500 MG PO TABS: 1000.0000 mg | ORAL_TABLET | Freq: Once | ORAL | Status: DC

## 2024-06-02 MED ORDER — CHLORHEXIDINE GLUCONATE CLOTH 2 % EX PADS: 6.0000 | MEDICATED_PAD | Freq: Once | CUTANEOUS | Status: DC

## 2024-06-02 MED ORDER — BUPIVACAINE-EPINEPHRINE 0.25% -1:200000 IJ SOLN
INTRAMUSCULAR | Status: DC | PRN
  Administered 2024-06-02: 30 mL

## 2024-06-02 MED ORDER — PROPOFOL 10 MG/ML IV BOLUS
INTRAVENOUS | Status: AC
  Filled 2024-06-02: qty 20

## 2024-06-02 MED ORDER — ONDANSETRON HCL 4 MG/2ML IJ SOLN
INTRAMUSCULAR | Status: DC | PRN
  Administered 2024-06-02: 4 mg via INTRAVENOUS

## 2024-06-02 MED ORDER — OXYCODONE-ACETAMINOPHEN 5-325 MG PO TABS
1.0000 | ORAL_TABLET | ORAL | 0 refills | Status: AC | PRN
  Filled 2024-06-02 – 2024-06-03 (×2): qty 20, 4d supply, fill #0

## 2024-06-02 MED ORDER — PROPOFOL 10 MG/ML IV BOLUS
INTRAVENOUS | Status: DC | PRN
  Administered 2024-06-02: 150 mg via INTRAVENOUS

## 2024-06-02 MED ORDER — LACTATED RINGERS IV SOLN: INTRAVENOUS | Status: DC

## 2024-06-02 MED ORDER — OXYCODONE HCL 5 MG/5ML PO SOLN: 5.0000 mg | Freq: Once | ORAL | Status: DC | PRN

## 2024-06-02 MED ORDER — DEXAMETHASONE SOD PHOSPHATE PF 10 MG/ML IJ SOLN
INTRAMUSCULAR | Status: DC | PRN
  Administered 2024-06-02: 10 mg via INTRAVENOUS

## 2024-06-02 MED ORDER — FENTANYL CITRATE (PF) 50 MCG/ML IJ SOSY: 25.0000 ug | PREFILLED_SYRINGE | INTRAMUSCULAR | Status: DC | PRN

## 2024-06-02 MED ORDER — LIDOCAINE HCL (PF) 2 % IJ SOLN
INTRAMUSCULAR | Status: AC
  Filled 2024-06-02: qty 5

## 2024-06-02 MED ORDER — GLYCOPYRROLATE 0.2 MG/ML IJ SOLN
INTRAMUSCULAR | Status: DC | PRN
  Administered 2024-06-02: .4 mg via INTRAVENOUS

## 2024-06-02 MED ORDER — ROCURONIUM BROMIDE 10 MG/ML (PF) SYRINGE
PREFILLED_SYRINGE | INTRAVENOUS | Status: AC
  Filled 2024-06-02: qty 10

## 2024-06-02 MED ORDER — NEOSTIGMINE METHYLSULFATE 10 MG/10ML IV SOLN
INTRAVENOUS | Status: DC | PRN
  Administered 2024-06-02: 3 mg via INTRAVENOUS

## 2024-06-02 MED ORDER — ONDANSETRON HCL 4 MG/2ML IJ SOLN: 4.0000 mg | Freq: Once | INTRAMUSCULAR | Status: DC | PRN

## 2024-06-02 MED ORDER — NEOSTIGMINE METHYLSULFATE 3 MG/3ML IV SOSY
PREFILLED_SYRINGE | INTRAVENOUS | Status: AC
  Filled 2024-06-02: qty 3

## 2024-06-02 MED ORDER — ACETAMINOPHEN 500 MG PO TABS
1000.0000 mg | ORAL_TABLET | ORAL | Status: AC
  Administered 2024-06-02: 1000 mg via ORAL
  Filled 2024-06-02: qty 2

## 2024-06-02 MED ORDER — SODIUM CHLORIDE 0.9 % IV SOLN
INTRAVENOUS | Status: DC | PRN
  Administered 2024-06-02: 24 mL

## 2024-06-02 MED ORDER — CHLORHEXIDINE GLUCONATE 0.12 % MT SOLN: 15.0000 mL | Freq: Once | OROMUCOSAL | Status: DC

## 2024-06-02 MED ORDER — CEFAZOLIN SODIUM-DEXTROSE 2-4 GM/100ML-% IV SOLN
2.0000 g | INTRAVENOUS | Status: AC
  Administered 2024-06-02: 2 g via INTRAVENOUS
  Filled 2024-06-02: qty 100

## 2024-06-02 MED ORDER — SUGAMMADEX SODIUM 200 MG/2ML IV SOLN
INTRAVENOUS | Status: AC
  Filled 2024-06-02: qty 2

## 2024-06-02 MED ORDER — ROCURONIUM BROMIDE 10 MG/ML (PF) SYRINGE
PREFILLED_SYRINGE | INTRAVENOUS | Status: DC | PRN
  Administered 2024-06-02: 50 mg via INTRAVENOUS

## 2024-06-02 SURGICAL SUPPLY — 33 items
BAG COUNTER SPONGE SURGICOUNT (BAG) IMPLANT
CATH URETL OPEN 5X70 (CATHETERS) IMPLANT
CHLORAPREP W/TINT 26 (MISCELLANEOUS) ×1 IMPLANT
CLIP APPLIE ROT 10 11.4 M/L (STAPLE) ×1 IMPLANT
COVER MAYO STAND XLG (MISCELLANEOUS) ×1 IMPLANT
COVER SURGICAL LIGHT HANDLE (MISCELLANEOUS) ×1 IMPLANT
DERMABOND ADVANCED .7 DNX12 (GAUZE/BANDAGES/DRESSINGS) ×1 IMPLANT
DRAPE C-ARM 42X120 X-RAY (DRAPES) IMPLANT
ELECT REM PT RETURN 15FT ADLT (MISCELLANEOUS) ×1 IMPLANT
ENDOLOOP SUT PDS II 0 18 (SUTURE) ×1 IMPLANT
GLOVE BIO SURGEON STRL SZ7.5 (GLOVE) ×1 IMPLANT
GLOVE INDICATOR 8.0 STRL GRN (GLOVE) ×1 IMPLANT
GOWN STRL REUS W/ TWL XL LVL3 (GOWN DISPOSABLE) ×1 IMPLANT
GRASPER SUT TROCAR 14GX15 (MISCELLANEOUS) IMPLANT
HEMOSTAT SNOW SURGICEL 2X4 (HEMOSTASIS) IMPLANT
IRRIGATION SUCT STRKRFLW 2 WTP (MISCELLANEOUS) ×1 IMPLANT
IV CATH 14GX2 1/4 (CATHETERS) ×1 IMPLANT
KIT BASIN OR (CUSTOM PROCEDURE TRAY) ×1 IMPLANT
KIT IMAGING PINPOINTPAQ (MISCELLANEOUS) IMPLANT
KIT TURNOVER KIT A (KITS) ×1 IMPLANT
NDL INSUFFLATION 14GA 120MM (NEEDLE) ×1 IMPLANT
POUCH RETRIEVAL ECOSAC 10 (ENDOMECHANICALS) ×1 IMPLANT
SCISSORS LAP 5X35 DISP (ENDOMECHANICALS) ×1 IMPLANT
SET TUBE SMOKE EVAC HIGH FLOW (TUBING) ×1 IMPLANT
SLEEVE Z-THREAD 5X100MM (TROCAR) ×2 IMPLANT
SPIKE FLUID TRANSFER (MISCELLANEOUS) ×1 IMPLANT
STOPCOCK 4 WAY LG BORE MALE ST (IV SETS) IMPLANT
SUT MNCRL AB 4-0 PS2 18 (SUTURE) ×1 IMPLANT
SUT VICRYL 0 UR6 27IN ABS (SUTURE) ×1 IMPLANT
TOWEL OR DSP ST BLU DLX 10/PK (DISPOSABLE) ×1 IMPLANT
TRAY LAPAROSCOPIC (CUSTOM PROCEDURE TRAY) ×1 IMPLANT
TROCAR ADV FIXATION 12X100MM (TROCAR) ×1 IMPLANT
TROCAR Z-THREAD OPTICAL 5X100M (TROCAR) ×1 IMPLANT

## 2024-06-02 NOTE — Transfer of Care (Signed)
 Immediate Anesthesia Transfer of Care Note  Patient: William [name]  Procedure(s) Performed: LAPAROSCOPIC CHOLECYSTECTOMY WITH INTRAOPERATIVE CHOLANGIOGRAM  Patient Location: PACU  Anesthesia Type:General  Level of Consciousness: sedated  Airway & Oxygen  Therapy: Patient Spontanous Breathing and Patient connected to face mask oxygen   Post-op Assessment: Report given to RN and Post -op Vital signs reviewed and stable  Post vital signs: Reviewed and stable  Last Vitals:  Vitals Value Taken Time  BP    Temp    Pulse 81 06/02/24 14:51  Resp    SpO2 100 % 06/02/24 14:51  Vitals shown include unfiled device data.  Last Pain:  Vitals:   06/02/24 1158  TempSrc: Oral         Complications: No notable events documented.

## 2024-06-02 NOTE — Discharge Instructions (Signed)
 CHOLECYSTECTOMY POST OPERATIVE INSTRUCTIONS  Thinking Clearly  The anesthesia may cause you to feel different for 1 or 2 days. Do not drive, drink alcohol, or make any big decisions for at least 2 days.  Nutrition When you wake up, you will be able to drink small amounts of liquid. If you do not feel sick, you can slowly advance your diet to regular foods. Continue to drink lots of fluids, usually about 8 to 10 glasses per day. Eat a high-fiber diet so you don't strain during bowel movements. High-Fiber Foods Foods high in fiber include beans, bran cereals and whole-grain breads, peas, dried fruit (figs, apricots, and dates), raspberries, blackberries, strawberries, sweet corn, broccoli, baked potatoes with skin, plums, pears, apples, greens, and nuts. Activity Slowly increase your activity. Be sure to get up and walk every hour or so to prevent blood clots. No heavy lifting or strenuous activity for 4 weeks following surgery to prevent hernias at your incision sites It is normal to feel tired. You may need more sleep than usual.  Get your rest but make sure to get up and move around frequently to prevent blood clots and pneumonia.  Work and Return to [employer] can go back to work when you feel well enough. Discuss the timing with your surgeon. You can usually go back to school or work 1 week after an operation. If your work requires heavy lifting or strenuous activity you need to be placed on light duty for 4 weeks following surgery. You can return to gym class, sports or other physical activities 4 weeks after surgery.  Wound Care Always wash your hands before and after touching near your incision site. Do not soak in a bathtub until cleared at your follow up appointment. You may take a shower 24 hours after surgery. A small amount of drainage from the incision is normal. If the drainage is thick and yellow or the site is red, you may have an infection, so call your surgeon. If you  have a drain in one of your incisions, it will be taken out in office when the drainage stops. Steri-Strips will fall off in 7 to 10 days or they will be removed during your first office visit. If you have dermabond glue covering over the incision, allow the glue to flake off on its own. Avoid wearing tight or rough clothing. It may rub your incisions and make it harder for them to heal. Protect the new skin, especially from the sun. The sun can burn and cause darker scarring. Your scar will heal in about 4 to 6 weeks and will become softer and continue to fade over the next year.  The cosmetic appearance of the incisions will improve over the course of the first year after surgery. Sensation around your incision will return in a few weeks or months.  Bowel Movements After intestinal surgery, you may have loose watery stools for several days. If watery diarrhea lasts longer than 3 days, contact your surgeon. Pain medication (narcotics) can cause constipation. Increase the fiber in your diet with high-fiber foods if you are constipated. You can take an over the counter stool softener like Colace to avoid constipation.  Additional over the counter medications can also be used if Colace isn't sufficient (for example, Milk of Magnesia or Miralax).  Pain The amount of pain is different for each person. Some people need only 1 to 3 doses of pain control medication, while others need more. Take alternating doses of tylenol   and ibuprofen around the clock for the first five days following surgery.  This will provide a baseline of pain control and help with inflammation.  Take the narcotic pain medication in addition if needed for severe pain.  Contact Your Surgeon at [phone], if you have: Pain in your right upper abdomen like a gallbladder attack. Pain that will not go away Pain that gets worse A fever of more than 101F (38.3C) Repeated vomiting Swelling, redness, bleeding, or bad-smelling  drainage from your wound site Strong abdominal pain No bowel movement or unable to pass gas for 3 days Watery diarrhea lasting longer than 3 days  Pain Control The goal of pain control is to minimize pain, keep you moving and help you heal. Your surgical team will work with you on your pain plan. Most often a combination of therapies and medications are used to control your pain. You may also be given medication (local anesthetic) at the surgical site. This may help control your pain for several days. Extreme pain puts extra stress on your body at a time when your body needs to focus on healing. Do not wait until your pain has reached a level "10" or is unbearable before telling your doctor or nurse. It is much easier to control pain before it becomes severe. Following a laparoscopic procedure, pain is sometimes felt in the shoulder. This is due to the gas inserted into your abdomen during the procedure. Moving and walking helps to decrease the gas and the right shoulder pain.  Use the guide below for ways to manage your post-operative pain. Learn more by going to facs.org/safepaincontrol.  How Intense Is My Pain Common Therapies to Feel Better       I hardly notice my pain, and it does not interfere with my activities.  I notice my pain and it distracts me, but I can still do activities (sitting up, walking, standing).  Non-Medication Therapies  Ice (in a bag, applied over clothing at the surgical site), elevation, rest, meditation, massage, distraction (music, TV, play) walking and mild exercise Splinting the abdomen with pillows +  Non-Opioid Medications Acetaminophen  (Tylenol ) Non-steroidal anti-inflammatory drugs (NSAIDS) Aspirin, Ibuprofen (Motrin, Advil) Naproxen (Aleve) Take these as needed, when you feel pain. Both acetaminophen  and NSAIDs help to decrease pain and swelling (inflammation).      My pain is hard to ignore and is more noticeable even when I rest.  My  pain interferes with my usual activities.  Non-Medication Therapies  +  Non-Opioid medications  Take on a regular schedule (around-the-clock) instead of as needed. (For example, Tylenol  every 6 hours at 9:00 am, 3:00 pm, 9:00 pm, 3:00 am and Motrin every 6 hours at 12:00 am, 6:00 am, 12:00 pm, 6:00 pm)         I am focused on my pain, and I am not doing my daily activities.  I am groaning in pain, and I cannot sleep. I am unable to do anything.  My pain is as bad as it could be, and nothing else matters.  Non-Medication Therapies  +  Around-the-Clock Non-Opioid Medications  +  Short-acting opioids  Opioids should be used with other medications to manage severe pain. Opioids block pain and give a feeling of euphoria (feel high). Addiction, a serious side effect of opioids, is rare with short-term (a few days) use.  Examples of short-acting opioids include: Tramadol  (Ultram ), Hydrocodone  (Norco, Vicodin), Hydromorphone  (Dilaudid ), Oxycodone  (Oxycontin )     The above directions have been adapted from  the [employer] of [employer] Surgical Patient Education Program.  Please refer to the ACS website if needed: [email or web address].ashx.   Deward Foy, MD [facility] [address], [place], [place]  [address] ?  [address] [address], [place], [place]   [address] [phone] ? [phone] ? FAX [phone] Web site: [email or web address]

## 2024-06-02 NOTE — H&P (Signed)
 Admitting Physician: Deward PARAS Layne Lebon  Service: General Surgery  CC: Cholecystitis  Subjective   HPI: William [name] is an 73 y.o. male who is here for laparoscopic cholecystectomy  Past Medical History:  Diagnosis Date   AKI (acute kidney injury)    With STEMI in 2017   Arthritis    Chronic systolic CHF (congestive heart failure) ([name])    Depression    Diabetes mellitus without complication ([name])    Diabetic retinopathy ([name])    GERD (gastroesophageal reflux disease)    Hx of adenomatous colonic polyps 04/07/2018   Hyperlipidemia    Hypertension    Hypertensive retinopathy    Paroxysmal atrial fibrillation ([name])    Peripheral vascular disease    Pneumonia    Seizures ([name])    hx of as a child   STEMI (ST elevation myocardial infarction) ([name]) 2017    Past Surgical History:  Procedure Laterality Date   ABDOMINAL AORTOGRAM W/LOWER EXTREMITY N/A 09/19/2016   Procedure: Abdominal Aortogram w/Lower Extremity;  Surgeon: Deatrice DELENA Cage, MD;  Location: [facility];  Service: Cardiovascular;  Laterality: N/A;   ABDOMINAL AORTOGRAM W/LOWER EXTREMITY N/A 07/17/2023   Procedure: ABDOMINAL AORTOGRAM W/LOWER EXTREMITY;  Surgeon: Cage Deatrice DELENA, MD;  Location: [facility];  Service: Cardiovascular;  Laterality: N/A;   AMPUTATION Right 03/23/2016   Procedure: 1st and 2nd Ray Amputation Right Foot;  Surgeon: Jerona LULLA Sage, MD;  Location: [facility] OR;  Service: Orthopedics;  Laterality: Right;   AMPUTATION Right 06/21/2016   Procedure: RIGHT TRANSMETATARSAL AMPUTATION;  Surgeon: Jerona LULLA Sage, MD;  Location: [facility] OR;  Service: Orthopedics;  Laterality: Right;   CARDIAC CATHETERIZATION N/A 03/13/2016   Procedure: Right/Left Heart Cath and Coronary Angiography;  Surgeon: Ozell Fell, MD;  Location: [facility] INVASIVE CV LAB;  Service: Cardiovascular;  Laterality: N/A;   CARDIAC CATHETERIZATION N/A 03/13/2016   Procedure: IABP Insertion;  Surgeon: Ozell Fell, MD;  Location: [facility]  INVASIVE CV LAB;  Service: Cardiovascular;  Laterality: N/A;   CATARACT EXTRACTION     CATARACT EXTRACTION, BILATERAL     CERVICAL FUSION  1982, 1992   has had 3 neck surgeries from breaking his neck   CIRCUMCISION N/A 11/07/2021   Procedure: CIRCUMCISION ADULT;  Surgeon: Lovie Arlyss CROME, MD;  Location: [facility] ORS;  Service: Urology;  Laterality: N/A;   CORONARY ARTERY BYPASS GRAFT N/A 03/13/2016   Procedure: CORONARY ARTERY BYPASS GRAFTING (CABG) x 1 (SVG to OM) with EVH from LEFT GREATER SAPHENOUS VEIN;  Surgeon: Maude Fleeta Ochoa, MD;  Location: [facility] OR;  Service: Open Heart Surgery;  Laterality: N/A;   EYE SURGERY     LOWER EXTREMITY ANGIOGRAM  05/02/2016   Procedure: Lower Extremity Angiogram;  Surgeon: Deatrice DELENA Cage, MD;  Location: [facility];  Service: Cardiovascular;;  Limited left femoral runoff right femoral runoff   PERIPHERAL VASCULAR BALLOON ANGIOPLASTY  07/17/2023   Procedure: PERIPHERAL VASCULAR BALLOON ANGIOPLASTY;  Surgeon: Cage Deatrice DELENA, MD;  [facility]: [facility];  Service: Cardiovascular;;   PERIPHERAL VASCULAR CATHETERIZATION N/A 05/02/2016   Procedure: Abdominal Aortogram;  Surgeon: Deatrice DELENA Cage, MD;  Location: [facility];  Service: Cardiovascular;  Laterality: N/A;   PERIPHERAL VASCULAR CATHETERIZATION Right 05/02/2016   Procedure: Peripheral Vascular Balloon Angioplasty;  Surgeon: Deatrice DELENA Cage, MD;  [facility]: [facility];  Service: Cardiovascular;  Laterality: Right;  SFA   TEE WITHOUT CARDIOVERSION N/A 03/13/2016   Procedure: TRANSESOPHAGEAL ECHOCARDIOGRAM (TEE);  Surgeon: Maude Fleeta  Hanford, MD;  Location: [facility] OR;  Service: Open Heart Surgery;  Laterality: N/A;   VSD REPAIR N/A 03/13/2016   Procedure: VENTRICULAR SEPTAL DEFECT (VSD) REPAIR;  Surgeon: Maude Fleeta Hanford, MD;  Location: [facility] OR;  Service: Open Heart Surgery;  Laterality: N/A;    Family History  Problem Relation Age of Onset   Diabetes Maternal Grandmother    Diabetes  Mother    Aneurysm Mother    Peripheral Artery Disease Mother    Coronary artery disease Mother    Peptic Ulcer Father    Retinoblastoma Daughter    Colon cancer Neg Hx    Rectal cancer Neg Hx     Social:  reports that he quit smoking about 8 years ago. His smoking use included cigarettes. He has never used smokeless tobacco. He reports that he does not drink alcohol and does not use drugs.  Allergies:  Allergies  Allergen Reactions   Morphine And Codeine  Shortness Of Breath and Other (See Comments)    UNSPECIFIED REACTION Pt said it was too much    Latex Rash    Medications: Current Outpatient Medications  Medication Instructions   acetaminophen  (TYLENOL ) 500 mg, Every 6 hours PRN   aspirin  EC 81 mg, Oral, Daily, Swallow whole.   Blood Glucose Monitoring Suppl DEVI Use to test blood sugar morning, at noon, and at bedtime.   carvedilol  (COREG ) 3.125 MG tablet Take 1 tablet (3.125 mg total) by mouth 2 (two) times daily with a meal.   Continuous Glucose Receiver (DEXCOM G7 RECEIVER) DEVI Use to monitor blood sugar.   Continuous Glucose Sensor (DEXCOM G7 SENSOR) MISC INJECT 1 SENSOR EVERY 10 DAYS TO MONITOR BLOOD SUGAR   Creon  36,000 Units, Oral, 3 times daily before meals   Farxiga  10 mg, Oral, Daily   gabapentin  (NEURONTIN ) 300 mg, Oral, 2 times daily   glucose blood (ACCU-CHEK GUIDE TEST) test strip USE TO CHECK BLOOD SUGAR IN THE AM, AT NOON AND AT BEDTIME   insulin  glargine (LANTUS  SOLOSTAR) 100 UNIT/ML Solostar Pen ADMINISTER 40 UNITS UNDER THE SKIN AT BEDTIME   Insulin  Glargine Solostar (LANTUS ) 55 Units, Subcutaneous, Every evening   Insulin  Pen Needle 31G X 8 MM MISC Use once daily as directed.   Insulin  Pen Needle 31G X 8 MM MISC Use to inject insulin  four times daily.   Lancets Misc. (ACCU-CHEK FASTCLIX LANCET) KIT Use in the morning, noon and at bedtime.   Ozempic  (0.25 or 0.5 MG/DOSE) 0.25 mg, Subcutaneous, Weekly   pantoprazole  (PROTONIX ) 40 MG tablet Take 1  tablet (40 mg total) by mouth 2 (two) times daily before a meal.   pramipexole  (MIRAPEX ) 0.125 mg, Oral, Daily at bedtime   rosuvastatin  (CRESTOR ) 40 mg, Oral, Daily   torsemide  (DEMADEX ) 20 mg, Oral, 3 times weekly   Vascepa  2 g, Oral, 2 times daily   warfarin (COUMADIN ) 5 MG tablet TAKE 1 TO 1 AND 1/2 TABLETS BY MOUTH DAILY AS DIRECTED BY COUMADIN  CLINIC    ROS - all of the below systems have been reviewed with the patient and positives are indicated with bold text General: chills, fever or night sweats Eyes: blurry vision or double vision ENT: epistaxis or sore throat Allergy/Immunology: itchy/watery eyes or nasal congestion Hematologic/Lymphatic: bleeding problems, blood clots or swollen lymph nodes Endocrine: temperature intolerance or unexpected weight changes Breast: new or changing breast lumps or nipple discharge Resp: cough, shortness of breath, or wheezing CV: chest pain or dyspnea on exertion GI: as per HPI GU: dysuria,  trouble voiding, or hematuria MSK: joint pain or joint stiffness Neuro: TIA or stroke symptoms Derm: pruritus and skin lesion changes Psych: anxiety and depression  Objective   PE Blood pressure 136/72, pulse 88, temperature 97.9 F (36.6 C), temperature source Oral, resp. rate 18, height 5' 5 (1.651 m), weight 73 kg, SpO2 100%. Constitutional: NAD; conversant; no deformities Eyes: Moist conjunctiva; no lid lag; anicteric; PERRL Neck: Trachea midline; no thyromegaly Lungs: Normal respiratory effort; no tactile fremitus CV: RRR; no palpable thrills; no pitting edema GI: Abd Soft, nontender; no palpable hepatosplenomegaly MSK: Normal range of motion of extremities; no clubbing/cyanosis Psychiatric: Appropriate affect; alert and oriented x3 Lymphatic: No palpable cervical or axillary lymphadenopathy  Results for orders placed or performed during the hospital encounter of 06/02/24 (from the past 24 hours)  Glucose, capillary     Status: Abnormal    Collection Time: 06/02/24 12:02 PM  Result Value Ref Range   Glucose-Capillary 117 (H) 70 - 99 mg/dL   Comment 1 Notify RN     Imaging Orders  No imaging studies ordered today     Assessment and Plan   Emilo Gras is an 73 y.o. male with cholecystitis.  I recommended laparoscopic cholecystectomy with intraoperative cholangiogram.  We discussed the procedure, its risks, benefits and alternatives and the patient granted consent to proceed.    ICD-10-CM   1. Type 2 diabetes mellitus treated with insulin  ([name])  E11.9 CBG per Guidelines for Diabetes Management for Patients Undergoing Surgery ([facility], [facility], and [facility] only)   Z79.4 CBG per Guidelines for Diabetes Management for Patients Undergoing Surgery ([facility], [facility], and [facility] only)    2. Anticoagulant long-term use  Z79.01 APTT    Protime-INR    APTT    Protime-INR       Deward JINNY Foy, MD  [facility] Use [email or web address] to contact on call provider

## 2024-06-02 NOTE — Anesthesia Procedure Notes (Signed)
 Procedure Name: Intubation Date/Time: 06/02/2024 1:02 PM  Performed by: Carleton Garnette SAUNDERS, CRNAPre-anesthesia Checklist: Patient identified, Emergency Drugs available, Suction available, Patient being monitored and Timeout performed Patient Re-evaluated:Patient Re-evaluated prior to induction Oxygen  Delivery Method: Circle system utilized Preoxygenation: Pre-oxygenation with 100% oxygen  Induction Type: IV induction Ventilation: Mask ventilation without difficulty Laryngoscope Size: Mac and 3 Grade View: Grade I Tube type: Oral Tube size: 7.5 mm Number of attempts: 1 Airway Equipment and Method: Stylet Placement Confirmation: ETT inserted through vocal cords under direct vision, positive ETCO2 and breath sounds checked- equal and bilateral Secured at: 22 cm Tube secured with: Tape Dental Injury: Teeth and Oropharynx as per pre-operative assessment

## 2024-06-02 NOTE — Op Note (Signed)
 Patient: William [name] ([birth year], [id number])  Date of Surgery: 06/02/2024  Preoperative Diagnosis: GALLSTONES   Postoperative Diagnosis: GALLSTONES   Surgical Procedure: LAPAROSCOPIC CHOLECYSTECTOMY WITH INTRAOPERATIVE CHOLANGIOGRAM: 52436 (CPT)    Operative Team Members:  Surgeons and Role:    * Marilin Kofman, Deward PARAS, MD - Primary   Anesthesiologist: Mallory Manus, MD CRNA: Carleton Garnette SAUNDERS, CRNA; Zulema Leita PARAS, CRNA   Anesthesia: General   Fluids:  Total I/O In: 1100 [I.V.:1000; IV Piggyback:100] Out: -   Complications: * No complications entered in OR log *  Drains:  none   Specimen:  ID Type Source Tests Collected by Time Destination  1 : Gallbladder Tissue PATH Gallbladder SURGICAL PATHOLOGY Mlissa Tamayo, Deward PARAS, MD 06/02/2024 1329      Disposition:  PACU - hemodynamically stable.  Plan of Care: Discharge to home after PACU    Indications for Procedure: William [name] is a 73 y.o. male who presented with abdominal pain.  History, physical and imaging was concerning for cholecystitis.  Laparoscopic cholecystectomy was recommended for the patient.  The procedure itself, as well as the risks, benefits and alternatives were discussed with the patient.  Risks discussed included but were not limited to the risk of infection, bleeding, damage to nearby structures, need to convert to open procedure, incisional hernia, bile leak, common bile duct injury and the need for additional procedures or surgeries.  With this discussion complete and all questions answered the patient granted consent to proceed.  Findings:  Severely inflamed gallbladder No evidence of bile duct obstruction on IOC Cystic dilation of the proximal left hepatic duct noted on IOC - I don't see mention of this on recent MRI  Infection status: Patient: Private Patient Elective Case Case: Elective Infection Present At Time Of Surgery (PATOS): Inflamed gallbladder   Description of Procedure:   On the  date stated above, the patient was taken to the operating room suite and placed in supine positioning.  Sequential compression devices were placed on the lower extremities to prevent blood clots.  General endotracheal anesthesia was induced. Preoperative antibiotics were given.  The patient's abdomen was prepped and draped in the usual sterile fashion.  A time-out was completed verifying the correct patient, procedure, positioning and equipment needed for the case.  We began by anesthetizing the skin with local anesthetic and then making a 5 mm incision just below the umbilicus.  We dissected through the subcutaneous tissues to the fascia.  The fascia was grasped and elevated using a Kocher clamp.  A Veress needle was inserted into the abdomen and the abdomen was insufflated to 15 mmHg.  A 5 mm trocar was inserted in this position under optical guidance and then the abdomen was inspected.  There was no trauma to the underlying viscera with initial trocar placement.  Any abnormal findings, other than inflammation in the right upper quadrant, are listed above in the findings section.  Three additional trocars were placed, one 12 mm trocar in the subxiphoid position, one 5 mm trocar in the midline epigastric area and one 5mm trocar in the right upper quadrant subcostally.  These were placed under direct vision without any trauma to the underlying viscera.    The patient was then placed in head up, left side down positioning.  The gallbladder was identified and dissected free from its attachments to the omentum allowing the duodenum to fall away.  The infundibulum of the gallbladder was dissected free working laterally to medially.  The cystic duct and cystic artery were  dissected free from surrounding connective tissue.  The infundibulum of the gallbladder was dissected off the cystic plate.  A critical view of safety was obtained with the cystic duct and cystic artery being cleared of connective tissues and  clearly the only two structures entering into the gallbladder with the liver clearly visible behind.  One clip was applied high on the cystic duct.  A small ductotomy was created below this using the endoscopic shears.  A cholangiogram catheter was introduced through the abdominal wall and into the cystic duct through this ductotomy.  The catheter was clipped into position.  The catheter was flushed to ensure no leakage around the clip.  We then removed the laparoscopic instruments and positioned the C-Arm to perform a cholangiogram.  The catheter was flushed with contrast under fluoroscopic visualization and a cholangiogram was obtained.  The cholangiogram visualized the biliary tree from the ampulla up to the first two biliary radicals in the liver.  There were no filling defects identified.  The catheter clearly entered the cystic duct.  There was gradual tapering of the common bile duct down to the ampulla without evidence of stricture.  There was a cystic dilation of the proximal left hepatic duct noted.  Please see the EMR for saved representative images.  With our cholangiogram compete, we moved the c-arm away from the field and returned to laparoscopic surgery.    Clips were then applied to the cystic duct and cystic artery and then these structures were divided.  A PDS endoloop was placed to secure the cystic duct.  The gallbladder was dissected off the cystic plate, placed in an endocatch bag and removed from the 12 mm subxiphoid port site.  The clips were inspected and appeared effective.  The cystic plate was inspected and hemostasis was obtained using electrocautery.  A suction irrigator was used to clean the operative field.  Attention was turned to closure.  The 12 mm subxiphoid port site was closed using a 0-vicryl suture on a fascial suture passer.  The abdomen was desufflated.  The skin was closed using 4-0 monocryl and dermabond.  All sponge and needle counts were correct at the conclusion of  the case.    Deward Foy, MD General, Bariatric, & Minimally Invasive Surgery [facility] [place]

## 2024-06-03 ENCOUNTER — Other Ambulatory Visit (HOSPITAL_COMMUNITY): Payer: Self-pay

## 2024-06-03 ENCOUNTER — Other Ambulatory Visit: Payer: Self-pay

## 2024-06-03 ENCOUNTER — Encounter (HOSPITAL_COMMUNITY): Payer: Self-pay | Admitting: Surgery

## 2024-06-03 NOTE — Anesthesia Postprocedure Evaluation (Signed)
 Anesthesia Post Note  Patient: Amanuel Sinkfield  Procedure(s) Performed: LAPAROSCOPIC CHOLECYSTECTOMY WITH INTRAOPERATIVE CHOLANGIOGRAM     Patient location during evaluation: PACU Anesthesia Type: General Level of consciousness: awake and alert Pain management: pain level controlled Vital Signs Assessment: post-procedure vital signs reviewed and stable Respiratory status: spontaneous breathing, nonlabored ventilation, respiratory function stable and patient connected to nasal cannula oxygen  Cardiovascular status: blood pressure returned to baseline and stable Postop Assessment: no apparent nausea or vomiting Anesthetic complications: no   No notable events documented.  Last Vitals:  Vitals:   06/02/24 1645 06/02/24 1700  BP: (!) 135/57 (!) 136/54  Pulse: 60 77  Resp: 20 18  Temp: 36.8 C   SpO2: 96% 95%    Last Pain:  Vitals:   06/02/24 1645  TempSrc:   PainSc: 0-No pain   Pain Goal:                   Shifa Brisbon

## 2024-06-04 LAB — SURGICAL PATHOLOGY

## 2024-06-08 ENCOUNTER — Other Ambulatory Visit (HOSPITAL_COMMUNITY): Payer: Self-pay

## 2024-06-08 MED ORDER — TORSEMIDE 20 MG PO TABS
20.0000 mg | ORAL_TABLET | ORAL | 5 refills | Status: AC
  Filled 2024-06-09: qty 24, 84d supply, fill #0

## 2024-06-09 ENCOUNTER — Other Ambulatory Visit (HOSPITAL_COMMUNITY): Payer: Self-pay

## 2024-06-09 ENCOUNTER — Other Ambulatory Visit: Payer: Self-pay

## 2024-06-11 ENCOUNTER — Ambulatory Visit: Attending: Cardiovascular Disease

## 2024-06-11 DIAGNOSIS — Z7901 Long term (current) use of anticoagulants: Secondary | ICD-10-CM

## 2024-06-11 DIAGNOSIS — I480 Paroxysmal atrial fibrillation: Secondary | ICD-10-CM

## 2024-06-11 LAB — POCT INR: INR: 1.8 — AB (ref 2.0–3.0)

## 2024-06-11 NOTE — Progress Notes (Signed)
 INR 1.8 Please see anticoagulation encounter Take 2 tablets tonight only then Continue taking 1.5 tablets daily except for 1 tablet every Mondays and Fridays. Recheck INR in 4 weeks. Stay consistent with [facility] [phone]

## 2024-06-11 NOTE — Patient Instructions (Signed)
 Take 2 tablets tonight only then Continue taking 1.5 tablets daily except for 1 tablet every Mondays and Fridays. Recheck INR in 4 weeks. Stay consistent with [facility] [phone]

## 2024-06-12 ENCOUNTER — Other Ambulatory Visit: Payer: Self-pay

## 2024-06-12 ENCOUNTER — Other Ambulatory Visit (HOSPITAL_COMMUNITY): Payer: Self-pay

## 2024-06-12 ENCOUNTER — Other Ambulatory Visit: Payer: Self-pay | Admitting: Internal Medicine

## 2024-06-12 DIAGNOSIS — I5022 Chronic systolic (congestive) heart failure: Secondary | ICD-10-CM

## 2024-06-12 DIAGNOSIS — E118 Type 2 diabetes mellitus with unspecified complications: Secondary | ICD-10-CM

## 2024-06-12 DIAGNOSIS — N1832 Chronic kidney disease, stage 3b: Secondary | ICD-10-CM

## 2024-06-12 MED ORDER — DAPAGLIFLOZIN PROPANEDIOL 10 MG PO TABS
10.0000 mg | ORAL_TABLET | Freq: Every day | ORAL | 1 refills | Status: AC
  Filled 2024-06-12: qty 90, 90d supply, fill #0

## 2024-06-22 NOTE — Progress Notes (Signed)
 " [facility] - Clinic Note  07/06/2024    CHIEF COMPLAINT Patient presents for Retina Follow Up  HISTORY OF PRESENT ILLNESS: William [name] is a 73 y.o. male who presents to the clinic today for:   HPI     Retina Follow Up   Patient presents with  Diabetic Retinopathy.  In both eyes.  Severity is moderate.  Duration of 5 weeks.  Since onset it is stable.  I, the attending physician,  performed the HPI with the patient and updated documentation appropriately.        Comments   Pt here for 5 wk f/u NPDR OU. Pt states VA is stable, had a cholecystectomy in Dec 2025 after his appt here. Having some dizziness intermittently. Vision reported stable.       Last edited by Valdemar Rogue, MD on 07/06/2024 12:05 PM.     Patient states he has had gall bladder taken out sine his lat visit. He feels the vision is the same.  Referring physician: Joshua Debby CROME, MD [address] [place],  [place] [address]  HISTORICAL INFORMATION:  Selected notes from the MEDICAL RECORD NUMBER Referred by Dr. Lelon for eval of DME OU LEE:  Ocular Hx- PMH-    CURRENT MEDICATIONS: No current outpatient medications on file. (Ophthalmic Drugs)   No current facility-administered medications for this visit. (Ophthalmic Drugs)   Current Outpatient Medications (Other)  Medication Sig   acetaminophen  (TYLENOL ) 500 MG tablet Take 500 mg by mouth every 6 (six) hours as needed for moderate pain.   aspirin  EC 81 MG tablet Take 1 tablet (81 mg total) by mouth daily. Swallow whole.   Blood Glucose Monitoring Suppl DEVI Use to test blood sugar morning, at noon, and at bedtime.   Continuous Glucose Receiver (DEXCOM G7 RECEIVER) DEVI Use to monitor blood sugar.   Continuous Glucose Sensor (DEXCOM G7 SENSOR) MISC INJECT 1 SENSOR EVERY 10 DAYS TO MONITOR BLOOD SUGAR   dapagliflozin  propanediol (FARXIGA ) 10 MG TABS tablet Take 1 tablet (10 mg total) by mouth daily.   glucose blood (ACCU-CHEK GUIDE  TEST) test strip USE TO CHECK BLOOD SUGAR IN THE AM, AT NOON AND AT BEDTIME   icosapent  Ethyl (VASCEPA ) 1 g capsule Take 2 capsules (2 g total) by mouth 2 (two) times daily.   insulin  glargine (LANTUS  SOLOSTAR) 100 UNIT/ML Solostar Pen ADMINISTER 40 UNITS UNDER THE SKIN AT BEDTIME   Insulin  Pen Needle 31G X 8 MM MISC Use once daily as directed.   Insulin  Pen Needle 31G X 8 MM MISC Use to inject insulin  four times daily.   Lancets Misc. (ACCU-CHEK FASTCLIX LANCET) KIT Use in the morning, noon and at bedtime.   lipase/protease/amylase (CREON ) 36000 UNITS CPEP capsule Take 1 capsule (36,000 Units total) by mouth 3 (three) times daily before meals.   oxyCODONE -acetaminophen  (PERCOCET) 5-325 MG tablet Take 1 tablet by mouth every 4 (four) hours as needed for severe pain (pain score 7-10).   OZEMPIC , 0.25 OR 0.5 MG/DOSE, 2 MG/3ML SOPN Inject 0.25 mg into the skin once a week.   pantoprazole  (PROTONIX ) 40 MG tablet Take 1 tablet (40 mg total) by mouth 2 (two) times daily before a meal.   pramipexole  (MIRAPEX ) 0.125 MG tablet Take 1 tablet (0.125 mg total) by mouth at bedtime.   rosuvastatin  (CRESTOR ) 40 MG tablet Take 1 tablet (40 mg total) by mouth daily.   torsemide  (DEMADEX ) 20 MG tablet Take 1 tablet (20 mg total) by mouth  3 (three) times a week. (Patient taking differently: Take 20 mg by mouth 2 (two) times a week.)   torsemide  (DEMADEX ) 20 MG tablet Take 1 tablet (20 mg total) by mouth 2 (two) times a week.   warfarin (COUMADIN ) 5 MG tablet TAKE 1 TO 1 AND 1/2 TABLETS BY MOUTH DAILY AS DIRECTED BY COUMADIN  CLINIC (Patient taking differently: Take 5-7.5 mg by mouth See admin instructions. Take 5 mg by mouth on Monday and Friday and take 7.5 mg on Tuesday, Wednesday, Thursday, Saturday and Sunday)   carvedilol  (COREG ) 3.125 MG tablet Take 1 tablet (3.125 mg total) by mouth 2 (two) times daily with a meal. (Patient not taking: Reported on 07/06/2024)   gabapentin  (NEURONTIN ) 300 MG capsule Take 1 capsule  (300 mg total) by mouth 2 (two) times daily.   Insulin  Glargine Solostar (LANTUS ) 100 UNIT/ML Solostar Pen Inject 55 Units into the skin every evening. (Patient not taking: Reported on 07/06/2024)   No current facility-administered medications for this visit. (Other)   REVIEW OF SYSTEMS: ROS   Positive for: Gastrointestinal, Genitourinary, Endocrine, Eyes Negative for: Constitutional, Neurological, Skin, Musculoskeletal, HENT, Cardiovascular, Respiratory, Psychiatric, Allergic/Imm, Heme/Lymph Last edited by Antonetta Almetta BRAVO, COT on 07/06/2024  8:00 AM.        ALLERGIES Allergies  Allergen Reactions   Morphine And Codeine  Shortness Of Breath and Other (See Comments)    UNSPECIFIED REACTION Pt said it was too much    Latex Rash   PAST MEDICAL HISTORY Past Medical History:  Diagnosis Date   AKI (acute kidney injury)    With STEMI in 2017   Arthritis    Chronic systolic CHF (congestive heart failure) ([name])    Depression    Diabetes mellitus without complication ([name])    Diabetic retinopathy ([name])    GERD (gastroesophageal reflux disease)    Hx of adenomatous colonic polyps 04/07/2018   Hyperlipidemia    Hypertension    Hypertensive retinopathy    Paroxysmal atrial fibrillation ([name])    Peripheral vascular disease    Pneumonia    Seizures ([name])    hx of as a child   STEMI (ST elevation myocardial infarction) ([name]) 2017   Past Surgical History:  Procedure Laterality Date   ABDOMINAL AORTOGRAM W/LOWER EXTREMITY N/A 09/19/2016   Procedure: Abdominal Aortogram w/Lower Extremity;  Surgeon: Deatrice DELENA Cage, MD;  Location: [facility];  Service: Cardiovascular;  Laterality: N/A;   ABDOMINAL AORTOGRAM W/LOWER EXTREMITY N/A 07/17/2023   Procedure: ABDOMINAL AORTOGRAM W/LOWER EXTREMITY;  Surgeon: Cage Deatrice DELENA, MD;  Location: [facility];  Service: Cardiovascular;  Laterality: N/A;   AMPUTATION Right 03/23/2016   Procedure: 1st and 2nd Ray Amputation Right  Foot;  Surgeon: Jerona LULLA Sage, MD;  Location: [facility] OR;  Service: Orthopedics;  Laterality: Right;   AMPUTATION Right 06/21/2016   Procedure: RIGHT TRANSMETATARSAL AMPUTATION;  Surgeon: Jerona LULLA Sage, MD;  Location: [facility] OR;  Service: Orthopedics;  Laterality: Right;   CARDIAC CATHETERIZATION N/A 03/13/2016   Procedure: Right/Left Heart Cath and Coronary Angiography;  Surgeon: Ozell Fell, MD;  Location: [facility] INVAS[facility] CV LAB;  Service: Cardiovascular;  Laterality: N/A;   CARDIAC CATHETERIZATION N/A 03/13/2016   Procedure: IABP Insertion;  Surgeon: Ozell Fell, MD;  Location: [facility] INVASIVE CV LAB;  Service: Cardiovascular;  Laterality: N/A;   CATARACT EXTRACTION     CATARACT EXTRACTION, BILATERAL     CERVICAL FUSION  1982, 1992   has had 3 neck surgeries from breaking his neck  CHOLECYSTECTOMY N/A 06/02/2024   Procedure: LAPAROSCOPIC CHOLECYSTECTOMY WITH INTRAOPERATIVE CHOLANGIOGRAM;  Surgeon: Lyndel Deward PARAS, MD;  Location: [facility] ORS;  Service: General;  Laterality: N/A;   CIRCUMCISION N/A 11/07/2021   Procedure: CIRCUMCISION ADULT;  Surgeon: Lovie Arlyss CROME, MD;  Location: [facility] ORS;  Service: Urology;  Laterality: N/A;   CORONARY ARTERY BYPASS GRAFT N/A 03/13/2016   Procedure: CORONARY ARTERY BYPASS GRAFTING (CABG) x 1 (SVG to OM) with EVH from LEFT GREATER SAPHENOUS VEIN;  Surgeon: Maude Fleeta Ochoa, MD;  Location: [facility] OR;  Service: Open Heart Surgery;  Laterality: N/A;   EYE SURGERY     LOWER EXTREMITY ANGIOGRAM  05/02/2016   Procedure: Lower Extremity Angiogram;  Surgeon: Deatrice DELENA Cage, MD;  Location: [facility];  Service: Cardiovascular;;  Limited left femoral runoff right femoral runoff   PERIPHERAL VASCULAR BALLOON ANGIOPLASTY  07/17/2023   Procedure: PERIPHERAL VASCULAR BALLOON ANGIOPLASTY;  Surgeon: Cage Deatrice DELENA, MD;  Location: [facility];  Service: Cardiovascular;;   PERIPHERAL VASCULAR CATHETERIZATION N/A 05/02/2016   Procedure: Abdominal Aortogram;  Surgeon:  Deatrice DELENA Cage, MD;  Location: [facility];  Service: Cardiovascular;  Laterality: N/A;   PERIPHERAL VASCULAR CATHETERIZATION Right 05/02/2016   Procedure: Peripheral Vascular Balloon Angioplasty;  Surgeon: Deatrice DELENA Cage, MD;  Location: [facility];  Service: Cardiovascular;  Laterality: Right;  SFA   TEE WITHOUT CARDIOVERSION N/A 03/13/2016   Procedure: TRANSESOPHAGEAL ECHOCARDIOGRAM (TEE);  Surgeon: Maude Fleeta Ochoa, MD;  Location: [facility] OR;  Service: Open Heart Surgery;  Laterality: N/A;   VSD REPAIR N/A 03/13/2016   Procedure: VENTRICULAR SEPTAL DEFECT (VSD) REPAIR;  Surgeon: Maude Fleeta Ochoa, MD;  Location: [facility] OR;  Service: Open Heart Surgery;  Laterality: N/A;   FAMILY HISTORY Family History  Problem Relation Age of Onset   Diabetes Maternal Grandmother    Diabetes Mother    Aneurysm Mother    Peripheral Artery Disease Mother    Coronary artery disease Mother    Peptic Ulcer Father    Retinoblastoma Daughter    Colon cancer Neg Hx    Rectal cancer Neg Hx    SOCIAL HISTORY Social History   Tobacco Use   Smoking status: Former    Current packs/day: 0.00    Average packs/day: 0.5 packs/day    Types: Cigarettes    Quit date: 11/20/2015    Years since quitting: 8.6   Smokeless tobacco: Never   Tobacco comments:    quit 2018  Vaping Use   Vaping status: Never Used  Substance Use Topics   Alcohol use: No   Drug use: No       OPHTHALMIC EXAM: Base Eye Exam     Visual Acuity (Snellen - Linear)       Right Left   Dist [place] 20/30 -2 20/20 -1   Dist ph [place] 20/20 -1          Tonometry (Tonopen, 8:08 AM)       Right Left   Pressure 17 15         Pupils       Dark Light Shape React APD   Right 2 1 Round Brisk None   Left 2 1 Round Brisk None         Visual Fields (Counting fingers)       Left Right    Full Full         Extraocular Movement       Right Left  Full, Ortho Full, Ortho         Neuro/Psych     Oriented x3: Yes    Mood/Affect: Normal         Dilation     Both eyes: 1.0% Mydriacyl, 2.5% Phenylephrine  @ 8:09 AM           Slit Lamp and Fundus Exam     Slit Lamp Exam       Right Left   Lids/Lashes Dermatochalasis - upper lid, mild MGD Dermatochalasis - upper lid, mild MGD   Conjunctiva/Sclera White and quiet White and quiet   Cornea trace PEE, well healed cataract wound, mild tear film debris 1+ fine PEE, mild tear film debris, well healed cataract wound   Anterior Chamber Deep and quiet Deep and quiet   Iris Round and dilated, No NVI Round and dilated, No NVI   Lens PC IOL in good position PC IOL in good position   Anterior Vitreous Vitreous syneresis, vitreous condensations Vitreous syneresis, Posterior vitreous detachment, vitreous condensations         Fundus Exam       Right Left   Disc trace Pallor, Sharp rim mild Pallor, Sharp rim, mild tilt   C/D Ratio 0.3 0.3   Macula Flat, good foveal reflex, scattered Microaneurysms, cystic changes inferior fovea and mac --slightly increased, good focal laser changes superiorly, good early focal laser changes inferiorly Flat, good foveal reflex, scattered MA -- stably improved, persistent cystic changes temporal and IN mac, good focal laser changes   Vessels attenuated, mild tortuosity, no NV attenuated, Tortuous   Periphery Attached, +MA greatest posteriorly Attached, scattered MA greatest posteriorly           IMAGING AND PROCEDURES  Imaging and Procedures for 07/06/2024  OCT, Retina - OU - Both Eyes       Right Eye Quality was good. Central Foveal Thickness: 303. Progression has worsened. Findings include no SRF, abnormal foveal contour, intraretinal hyper-reflective material, intraretinal fluid, vitreomacular adhesion (Persistent IRF/cystic changes inferior fovea and mac --slightly increased temp fovea).   Left Eye Quality was good. Central Foveal Thickness: 282. Progression has worsened. Findings include normal foveal contour, no  SRF, intraretinal hyper-reflective material, intraretinal fluid (Persistent cystic changes IN and temporal macula-- slightly increased IN fovea).   Notes *Images captured and stored on drive  Diagnosis / Impression:  +DME OU OD: Persistent IRF/cystic changes inferior fovea and mac --slightly increased temp fovea OS: Persistent cystic changes IN and temporal macula-- slightly increased IN fovea  Clinical management:  See below  Abbreviations: NFP - Normal foveal profile. CME - cystoid macular edema. PED - pigment epithelial detachment. IRF - intraretinal fluid. SRF - subretinal fluid. EZ - ellipsoid zone. ERM - epiretinal membrane. ORA - outer retinal atrophy. ORT - outer retinal tubulation. SRHM - subretinal hyper-reflective material. IRHM - intraretinal hyper-reflective material      Intravitreal Injection, Pharmacologic Agent - OD - Right Eye       Time Out 07/06/2024. 8:36 AM. Confirmed correct patient, procedure, site, and patient consented.   Anesthesia Topical anesthesia was used. Anesthetic medications included Lidocaine  2%, Proparacaine 0.5%.   Procedure Preparation included 5% betadine  to ocular surface, eyelid speculum. A supplied (32g) needle was used.   Injection: 1.25 mg Bevacizumab  1.25mg /0.33ml   Route: Intravitreal, Site: Right Eye   NDC: C2662926, Lot: [id number], Expiration date: 07/18/2024   Post-op Post injection exam found visual acuity of at least counting fingers. The patient tolerated the procedure well. There were  no complications. The patient received written and verbal post procedure care education. Post injection medications were not given.      Intravitreal Injection, Pharmacologic Agent - OS - Left Eye       Time Out 07/06/2024. 8:36 AM. Confirmed correct patient, procedure, site, and patient consented.   Anesthesia Topical anesthesia was used. Anesthetic medications included Lidocaine  2%, Proparacaine 0.5%.   Procedure Preparation included 5%  betadine  to ocular surface, eyelid speculum. A supplied (32g) needle was used.   Injection: 1.25 mg Bevacizumab  1.25mg /0.72ml   Route: Intravitreal, Site: Left Eye   NDC: 49757-939-98, Lot: [id number]$ , Expiration date: 07/25/2024   Post-op Post injection exam found visual acuity of at least counting fingers. The patient tolerated the procedure well. There were no complications. The patient received written and verbal post procedure care education. Post injection medications were not given.       POCT INR             Component Ref Range & Units Value Flag   INR   2.0 - 3.0 3.2  (A)     POC INR                   ASSESSMENT/PLAN:    ICD-10-CM   1. Moderate nonproliferative diabetic retinopathy of both eyes with macular edema associated with type 2 diabetes mellitus ([name])  E11.3313 OCT, Retina - OU - Both Eyes    Intravitreal Injection, Pharmacologic Agent - OD - Right Eye    Intravitreal Injection, Pharmacologic Agent - OS - Left Eye    Bevacizumab  (AVASTIN ) SOLN 1.25 mg    Bevacizumab  (AVASTIN ) SOLN 1.25 mg    2. Current use of insulin  ([name])  Z79.4     3. Long-term (current) use of injectable non-insulin  antidiabetic drugs  Z79.85     4. Essential hypertension  I10     5. Hypertensive retinopathy of both eyes  H35.033     6. Pseudophakia, both eyes  Z96.1      1-3. Moderate non-proliferative diabetic retinopathy, both eyes  - A1c was 6.7 (06.06.25), 6.5 (02.17.25) - s/p IVA OD #1 (11.14.22), #2 (12.12.22), #3 (01.09.23), #4 (06.23.25), #5 (08.04.25), #6(09.02.25), #7 (09.29.25), #8 (10.27.25), #9 (12.01.25) - s/p IVA OS #1 (10.17.22), #2 (11.14.22), #3 (12.12.22), #4 (01.09.23), #5 (03.15.23), #6 (04.12.23), #7 (08.04.25), #8 (09.02.25), #9 (09.29.25), #10 (10.27.25), #11 (12.01.25) ================== - s/p IVE OD #1 (03.15.23), #2 (04.12.23), #3 (05.10.23), #4 (06.07.23), #5 (07.05.23), #6 (08.02.23), #7 (08.31.23), #8 (09.28.23), #9 (10.30.23), #10 (11.28.23), #11  (12.28.23), #12 (01.29.24), #13 (02.20.24), #14 (03.25.24), #15 (04.22.24), #16 (05.20.24), #17 (06.17.24), #18 (07.22.24), #19 (08.26.24), #20 (09.30.24), #21 (11.04.24), #22 (12.16.24) - s/p IVE OS #1 (05.10.23), #2 (06.07.23), #3 (07.05.23), #4 (08.02.23), #5 (08.31.23), #6 (09.28.23), #7 (10.30.23), #8 (11.28.23), #9 (12.28.23), #10 (01.29.24), #11 (02.20.24), #12 (03.25.24), #13 (04.22.24), #14 (05.20.24), #15 (06.17.24), #16 (07.22.24), #17 (09.30.24), #18 (11.04.24), #19 (12.16.24) - s/p focal laser OS (06.25.24) - s/p focal laser OD #1 (01.13.25), #2 (11.12.25) - exam shows scattered MA, DBH OU - FA (10.17.22) shows late leaking MA OU, no NV OU - repeat FA 10.27.25) shows OD: Scattered late leaking MA greatest inferior to fovea, No NV, OS: Scattered late leaking MA greatest ST to fovea, No NV - OCT shows OD: Persistent IRF/cystic changes inferior fovea and mac --slightly increased temp fovea; OS: Persistent cystic changes IN and temporal macula-- slightly increased IN fovea at 5 wks - BCVA OD 20/20 from 20/25 OS stable at 20/20 -  recommend IVA today OU (01.05.26) w/ f/u in 4-5 weeks - RBA of procedure discussed, questions answered - informed consent obtained and signed - see procedure note  - IVA informed consent obtained and signed, 06.23.25 - 4-5 weeks - DFE, OCT, likely IVA OU  4,5. Hypertensive retinopathy OU - discussed importance of tight BP control  - monitor  6. Pseudophakia OU  - s/p CE/IOL OU  - IOL in good position, doing well  - monitor  Ophthalmic Meds Ordered this visit:  Meds ordered this encounter  Medications   Bevacizumab  (AVASTIN ) SOLN 1.25 mg   Bevacizumab  (AVASTIN ) SOLN 1.25 mg     Return in about 5 weeks (around 08/10/2024) for f/u, NPDR, DFE, OCT, Possible, IVA, OU.  There are no Patient Instructions on file for this visit.  Explained the diagnoses, plan, and follow up with the patient and they expressed understanding.  Patient expressed understanding  of the importance of proper follow up care.   This document serves as a record of services personally performed by Redell JUDITHANN Hans, MD, PhD. It was created on their behalf by Avelina Pereyra, COA an ophthalmic technician. The creation of this record is the provider's dictation and/or activities during the visit.   Electronically signed by: Avelina GORMAN Pereyra, COT  07/07/2024  2:17 AM   This document serves as a record of services personally performed by Redell JUDITHANN Hans, MD, PhD. It was created on their behalf by Wanda GEANNIE Keens, COT an ophthalmic technician. The creation of this record is the provider's dictation and/or activities during the visit.    Electronically signed by:  Wanda GEANNIE Keens, COT  07/07/2024 2:17 AM    Redell JUDITHANN Hans, M.D., Ph.D. Diseases & Surgery of the Retina and Vitreous [facility]  I have reviewed the above documentation for accuracy and completeness, and I agree with the above. Redell JUDITHANN Hans, M.D., Ph.D. 07/07/2024 2:19 AM   Abbreviations: M myopia (nearsighted); A astigmatism; H hyperopia (farsighted); P presbyopia; Mrx spectacle prescription;  CTL contact lenses; OD right eye; OS left eye; OU both eyes  XT exotropia; ET esotropia; PEK punctate epithelial keratitis; PEE punctate epithelial erosions; DES dry eye syndrome; MGD meibomian gland dysfunction; ATs artificial tears; PFAT's preservative free artificial tears; NSC nuclear sclerotic cataract; PSC posterior subcapsular cataract; ERM epi-retinal membrane; PVD posterior vitreous detachment; RD retinal detachment; DM diabetes mellitus; DR diabetic retinopathy; NPDR non-proliferative diabetic retinopathy; PDR proliferative diabetic retinopathy; CSME clinically significant macular edema; DME diabetic macular edema; dbh dot blot hemorrhages; CWS cotton wool spot; POAG primary open angle glaucoma; C/D cup-to-disc ratio; HVF humphrey visual field; GVF goldmann visual field; OCT optical coherence  tomography; IOP intraocular pressure; BRVO Branch retinal vein occlusion; CRVO central retinal vein occlusion; CRAO central retinal artery occlusion; BRAO branch retinal artery occlusion; RT retinal tear; SB scleral buckle; PPV pars plana vitrectomy; VH Vitreous hemorrhage; PRP panretinal laser photocoagulation; IVK intravitreal kenalog; VMT vitreomacular traction; MH Macular hole;  NVD neovascularization of the disc; NVE neovascularization elsewhere; AREDS age related eye disease study; ARMD age related macular degeneration; POAG primary open angle glaucoma; EBMD epithelial/anterior basement membrane dystrophy; ACIOL anterior chamber intraocular lens; IOL intraocular lens; PCIOL posterior chamber intraocular lens; Phaco/IOL phacoemulsification with intraocular lens placement; PRK photorefractive keratectomy; LASIK laser assisted in situ keratomileusis; HTN hypertension; DM diabetes mellitus; COPD chronic obstructive pulmonary disease "

## 2024-06-29 ENCOUNTER — Other Ambulatory Visit: Payer: Self-pay | Admitting: Internal Medicine

## 2024-06-29 ENCOUNTER — Telehealth: Payer: Self-pay

## 2024-06-29 DIAGNOSIS — K635 Polyp of colon: Secondary | ICD-10-CM

## 2024-06-29 NOTE — Telephone Encounter (Signed)
 Can you place an order for him to have a colonoscopy?

## 2024-06-29 NOTE — Telephone Encounter (Signed)
 Copied from CRM [id number]. Topic: Clinical - Medical Advice >> Jun 29, 2024  1:41 PM Ashley R wrote: Reason for CRM: surgery for gallbladder removal, completeed. Lower stomach still hurts, would like to know if colonoscopy has been ordered and if not can it be. Callback  [phone]

## 2024-07-01 ENCOUNTER — Other Ambulatory Visit: Payer: Self-pay | Admitting: Internal Medicine

## 2024-07-01 ENCOUNTER — Other Ambulatory Visit: Payer: Self-pay

## 2024-07-01 DIAGNOSIS — E1152 Type 2 diabetes mellitus with diabetic peripheral angiopathy with gangrene: Secondary | ICD-10-CM

## 2024-07-06 ENCOUNTER — Encounter (INDEPENDENT_AMBULATORY_CARE_PROVIDER_SITE_OTHER): Admitting: Ophthalmology

## 2024-07-06 ENCOUNTER — Other Ambulatory Visit (HOSPITAL_COMMUNITY): Payer: Self-pay

## 2024-07-06 ENCOUNTER — Ambulatory Visit

## 2024-07-06 ENCOUNTER — Encounter (INDEPENDENT_AMBULATORY_CARE_PROVIDER_SITE_OTHER): Payer: Self-pay | Admitting: Ophthalmology

## 2024-07-06 DIAGNOSIS — I480 Paroxysmal atrial fibrillation: Secondary | ICD-10-CM | POA: Diagnosis not present

## 2024-07-06 DIAGNOSIS — I10 Essential (primary) hypertension: Secondary | ICD-10-CM | POA: Diagnosis not present

## 2024-07-06 DIAGNOSIS — H35033 Hypertensive retinopathy, bilateral: Secondary | ICD-10-CM

## 2024-07-06 DIAGNOSIS — E113313 Type 2 diabetes mellitus with moderate nonproliferative diabetic retinopathy with macular edema, bilateral: Secondary | ICD-10-CM

## 2024-07-06 DIAGNOSIS — Z7985 Long-term (current) use of injectable non-insulin antidiabetic drugs: Secondary | ICD-10-CM

## 2024-07-06 DIAGNOSIS — Z794 Long term (current) use of insulin: Secondary | ICD-10-CM | POA: Diagnosis not present

## 2024-07-06 DIAGNOSIS — Z961 Presence of intraocular lens: Secondary | ICD-10-CM

## 2024-07-06 DIAGNOSIS — Z7901 Long term (current) use of anticoagulants: Secondary | ICD-10-CM

## 2024-07-06 LAB — POCT INR: INR: 3.2 — AB (ref 2.0–3.0)

## 2024-07-06 MED ORDER — BEVACIZUMAB CHEMO INJECTION 1.25MG/0.05ML SYRINGE FOR KALEIDOSCOPE
1.2500 mg | INTRAVITREAL | Status: AC | PRN
  Administered 2024-07-06: 1.25 mg via INTRAVITREAL

## 2024-07-06 MED ORDER — GABAPENTIN 300 MG PO CAPS
300.0000 mg | ORAL_CAPSULE | Freq: Two times a day (BID) | ORAL | 0 refills | Status: AC
  Filled 2024-07-06: qty 180, 90d supply, fill #0

## 2024-07-06 NOTE — Progress Notes (Signed)
 Description   INR 3.2, Skip today's dosage of Warfarin, then resume same dosage of 1.5 tablets daily except for 1 tablet every Mondays and Fridays. Recheck INR in 3 weeks. Increase green intake to 3 times weekly. Anticoagulation Clinic [phone]

## 2024-07-06 NOTE — Patient Instructions (Signed)
 Description   INR 3.2, Skip today's dosage of Warfarin, then resume same dosage of 1.5 tablets daily except for 1 tablet every Mondays and Fridays. Recheck INR in 3 weeks. Increase green intake to 3 times weekly. Anticoagulation Clinic [phone]

## 2024-07-07 ENCOUNTER — Other Ambulatory Visit: Payer: Self-pay

## 2024-07-08 ENCOUNTER — Other Ambulatory Visit: Payer: Self-pay

## 2024-07-09 ENCOUNTER — Other Ambulatory Visit: Payer: Self-pay

## 2024-07-13 ENCOUNTER — Other Ambulatory Visit (HOSPITAL_COMMUNITY): Payer: Self-pay

## 2024-07-13 ENCOUNTER — Other Ambulatory Visit: Payer: Self-pay

## 2024-07-16 ENCOUNTER — Other Ambulatory Visit: Payer: Self-pay | Admitting: Internal Medicine

## 2024-07-16 ENCOUNTER — Other Ambulatory Visit: Payer: Self-pay

## 2024-07-16 DIAGNOSIS — K8681 Exocrine pancreatic insufficiency: Secondary | ICD-10-CM

## 2024-07-20 ENCOUNTER — Other Ambulatory Visit (HOSPITAL_COMMUNITY): Payer: Self-pay

## 2024-07-20 ENCOUNTER — Other Ambulatory Visit: Payer: Self-pay

## 2024-07-20 ENCOUNTER — Other Ambulatory Visit: Payer: Self-pay | Admitting: Internal Medicine

## 2024-07-20 MED ORDER — PANCRELIPASE (LIP-PROT-AMYL) 36000-114000 UNITS PO CPEP
36000.0000 [IU] | ORAL_CAPSULE | Freq: Four times a day (QID) | ORAL | 0 refills | Status: AC
  Filled 2024-07-20: qty 300, 75d supply, fill #0

## 2024-07-21 ENCOUNTER — Other Ambulatory Visit (HOSPITAL_COMMUNITY): Payer: Self-pay

## 2024-07-22 ENCOUNTER — Other Ambulatory Visit (HOSPITAL_BASED_OUTPATIENT_CLINIC_OR_DEPARTMENT_OTHER): Payer: Self-pay

## 2024-07-27 ENCOUNTER — Ambulatory Visit

## 2024-07-28 ENCOUNTER — Other Ambulatory Visit: Payer: Self-pay | Admitting: Internal Medicine

## 2024-07-28 ENCOUNTER — Other Ambulatory Visit (HOSPITAL_COMMUNITY): Payer: Self-pay

## 2024-07-28 DIAGNOSIS — E119 Type 2 diabetes mellitus without complications: Secondary | ICD-10-CM

## 2024-07-28 MED ORDER — LANTUS SOLOSTAR 100 UNIT/ML ~~LOC~~ SOPN: 40.0000 [IU] | PEN_INJECTOR | Freq: Every day | SUBCUTANEOUS | 1 refills | Status: AC

## 2024-07-28 MED ORDER — ROSUVASTATIN CALCIUM 40 MG PO TABS: 40.0000 mg | ORAL_TABLET | Freq: Every day | ORAL | 1 refills | Status: AC

## 2024-07-28 NOTE — Progress Notes (Shared)
 " [facility] - Clinic Note  08/11/2024    CHIEF COMPLAINT Patient presents for No chief complaint on file.  HISTORY OF PRESENT ILLNESS: William [name] is a 74 y.o. male who presents to the clinic today for:     Patient states he has had gall bladder taken out sine his lat visit. He feels the vision is the same.  Referring physician: Joshua Debby CROME, MD [address] [place],  [place] [address]  HISTORICAL INFORMATION:  Selected notes from the MEDICAL RECORD NUMBER Referred by Dr. Lelon for eval of DME OU LEE:  Ocular Hx- PMH-    CURRENT MEDICATIONS: No current outpatient medications on file. (Ophthalmic Drugs)   No current facility-administered medications for this visit. (Ophthalmic Drugs)   Current Outpatient Medications (Other)  Medication Sig   acetaminophen  (TYLENOL ) 500 MG tablet Take 500 mg by mouth every 6 (six) hours as needed for moderate pain.   aspirin  EC 81 MG tablet Take 1 tablet (81 mg total) by mouth daily. Swallow whole.   Blood Glucose Monitoring Suppl DEVI Use to test blood sugar morning, at noon, and at bedtime.   carvedilol  (COREG ) 3.125 MG tablet Take 1 tablet (3.125 mg total) by mouth 2 (two) times daily with a meal. (Patient not taking: Reported on 07/06/2024)   Continuous Glucose Receiver (DEXCOM G7 RECEIVER) DEVI Use to monitor blood sugar.   Continuous Glucose Sensor (DEXCOM G7 SENSOR) MISC INJECT 1 SENSOR EVERY 10 DAYS TO MONITOR BLOOD SUGAR   dapagliflozin  propanediol (FARXIGA ) 10 MG TABS tablet Take 1 tablet (10 mg total) by mouth daily.   gabapentin  (NEURONTIN ) 300 MG capsule Take 1 capsule (300 mg total) by mouth 2 (two) times daily.   glucose blood (ACCU-CHEK GUIDE TEST) test strip USE TO CHECK BLOOD SUGAR IN THE AM, AT NOON AND AT BEDTIME   icosapent  Ethyl (VASCEPA ) 1 g capsule Take 2 capsules (2 g total) by mouth 2 (two) times daily.   insulin  glargine (LANTUS  SOLOSTAR) 100 UNIT/ML Solostar Pen ADMINISTER 40 UNITS UNDER THE  SKIN AT BEDTIME   Insulin  Glargine Solostar (LANTUS ) 100 UNIT/ML Solostar Pen Inject 55 Units into the skin every evening. (Patient not taking: Reported on 07/06/2024)   Insulin  Pen Needle 31G X 8 MM MISC Use once daily as directed.   Insulin  Pen Needle 31G X 8 MM MISC Use to inject insulin  four times daily.   Lancets Misc. (ACCU-CHEK FASTCLIX LANCET) KIT Use in the morning, noon and at bedtime.   lipase/protease/amylase (CREON ) 36000 UNITS CPEP capsule Take 1 capsule (36,000 Units total) by mouth 4 (four) times daily.   oxyCODONE -acetaminophen  (PERCOCET) 5-325 MG tablet Take 1 tablet by mouth every 4 (four) hours as needed for severe pain (pain score 7-10).   OZEMPIC , 0.25 OR 0.5 MG/DOSE, 2 MG/3ML SOPN Inject 0.25 mg into the skin once a week.   pantoprazole  (PROTONIX ) 40 MG tablet Take 1 tablet (40 mg total) by mouth 2 (two) times daily before a meal.   pramipexole  (MIRAPEX ) 0.125 MG tablet Take 1 tablet (0.125 mg total) by mouth at bedtime.   rosuvastatin  (CRESTOR ) 40 MG tablet Take 1 tablet (40 mg total) by mouth daily.   torsemide  (DEMADEX ) 20 MG tablet Take 1 tablet (20 mg total) by mouth 3 (three) times a week. (Patient taking differently: Take 20 mg by mouth 2 (two) times a week.)   torsemide  (DEMADEX ) 20 MG tablet Take 1 tablet (20 mg total) by mouth 2 (two) times a week.  warfarin (COUMADIN ) 5 MG tablet TAKE 1 TO 1 AND 1/2 TABLETS BY MOUTH DAILY AS DIRECTED BY COUMADIN  CLINIC (Patient taking differently: Take 5-7.5 mg by mouth See admin instructions. Take 5 mg by mouth on Monday and Friday and take 7.5 mg on Tuesday, Wednesday, Thursday, Saturday and Sunday)   No current facility-administered medications for this visit. (Other)   REVIEW OF SYSTEMS:      ALLERGIES Allergies  Allergen Reactions   Morphine And Codeine  Shortness Of Breath and Other (See Comments)    UNSPECIFIED REACTION Pt said it was too much    Latex Rash   PAST MEDICAL HISTORY Past Medical History:   Diagnosis Date   AKI (acute kidney injury)    With STEMI in 2017   Arthritis    Chronic systolic CHF (congestive heart failure) ([name])    Depression    Diabetes mellitus without complication ([name])    Diabetic retinopathy ([name])    GERD (gastroesophageal reflux disease)    Hx of adenomatous colonic polyps 04/07/2018   Hyperlipidemia    Hypertension    Hypertensive retinopathy    Paroxysmal atrial fibrillation ([name])    Peripheral vascular disease    Pneumonia    Seizures ([name])    hx of as a child   STEMI (ST elevation myocardial infarction) ([name]) 2017   Past Surgical History:  Procedure Laterality Date   ABDOMINAL AORTOGRAM W/LOWER EXTREMITY N/A 09/19/2016   Procedure: Abdominal Aortogram w/Lower Extremity;  Surgeon: Deatrice DELENA Cage, MD;  Location: [facility];  Service: Cardiovascular;  Laterality: N/A;   ABDOMINAL AORTOGRAM W/LOWER EXTREMITY N/A 07/17/2023   Procedure: ABDOMINAL AORTOGRAM W/LOWER EXTREMITY;  Surgeon: Cage Deatrice DELENA, MD;  Location: [facility];  Service: Cardiovascular;  Laterality: N/A;   AMPUTATION Right 03/23/2016   Procedure: 1st and 2nd Ray Amputation Right Foot;  Surgeon: Jerona LULLA Sage, MD;  Location[facility];  Service: Orthopedics;  Laterality: Right;   AMPUTATION Right 06/21/2016   Procedure: RIGHT TRANSMETATARSAL AMPUTATION;  Surgeon: Jerona LULLA Sage, MD;  Location: [facility] OR;  Service: Orthopedics;  Laterality: Right;   CARDIAC CATHETERIZATION N/A 03/13/2016   Procedure: Right/Left Heart Cath and Coronary Angiography;  Surgeon: Ozell Fell, MD;  Location: [facility] INVASIVE CV LAB;  Service: Cardiovascular;  Laterality: N/A;   CARDIAC CATHETERIZATION N/A 03/13/2016   Procedure: IABP Insertion;  Surgeon: Ozell Fell, MD;  Location: [facility] INVASIVE CV LAB;  Service: Cardiovascular;  Laterality: N/A;   CATARACT EXTRACTION     CATARACT EXTRACTION, BILATERAL     CERVICAL FUSION  1982, 1992   has had 3 neck surgeries from breaking his neck    CHOLECYSTECTOMY N/A 06/02/2024   Procedure: LAPAROSCOPIC CHOLECYSTECTOMY WITH INTRAOPERATIVE CHOLANGIOGRAM;  Surgeon: Lyndel Deward PARAS, MD;  Location: [facility] ORS;  Service: General;  Laterality: N/A;   CIRCUMCISION N/A 11/07/2021   Procedure: CIRCUMCISION ADULT;  Surgeon: Lovie Arlyss CROME, MD;  Location: [facility] ORS;  Service: Urology;  Laterality: N/A;   CORONARY ARTERY BYPASS GRAFT N/A 03/13/2016   Procedure: CORONARY ARTERY BYPASS GRAFTING (CABG) x 1 (SVG to OM) with EVH from LEFT GREATER SAPHENOUS VEIN;  Surgeon: Maude Fleeta Ochoa, MD;  Location: [facility] OR;  Service: Open Heart Surgery;  Laterality: N/A;   EYE SURGERY     LOWER EXTREMITY ANGIOGRAM  05/02/2016   Procedure: Lower Extremity Angiogram;  Surgeon: Deatrice DELENA Cage, MD;  Location: [facility];  Service: Cardiovascular;;  Limited left femoral runoff right femoral runoff   PERIPHERAL VASCULAR BALLOON  ANGIOPLASTY  07/17/2023   Procedure: PERIPHERAL VASCULAR BALLOON ANGIOPLASTY;  Surgeon: Darron Deatrice LABOR, MD;  Location: [facility];  Service: Cardiovascular;;   PERIPHERAL VASCULAR CATHETERIZATION N/A 05/02/2016   Procedure: Abdominal Aortogram;  Surgeon: Deatrice LABOR Darron, MD;  Location: [facility];  Service: Cardiovascular;  Laterality: N/A;   PERIPHERAL VASCULAR CATHETERIZATION Right 05/02/2016   Procedure: Peripheral Vascular Balloon Angioplasty;  Surgeon: Deatrice LABOR Darron, MD;  Location: [facility];  Service: Cardiovascular;  Laterality: Right;  SFA   TEE WITHOUT CARDIOVERSION N/A 03/13/2016   Procedure: TRANSESOPHAGEAL ECHOCARDIOGRAM (TEE);  Surgeon: Maude Fleeta Ochoa, MD;  Location: [facility] OR;  Service: Open Heart Surgery;  Laterality: N/A;   VSD REPAIR N/A 03/13/2016   Procedure: VENTRICULAR SEPTAL DEFECT (VSD) REPAIR;  Surgeon: Maude Fleeta Ochoa, MD;  Location: [facility] OR;  Service: Open Heart Surgery;  Laterality: N/A;   FAMILY HISTORY Family History  Problem Relation Age of Onset   Diabetes Maternal Grandmother     Diabetes Mother    Aneurysm Mother    Peripheral Artery Disease Mother    Coronary artery disease Mother    Peptic Ulcer Father    Retinoblastoma Daughter    Colon cancer Neg Hx    Rectal cancer Neg Hx    SOCIAL HISTORY Social History   Tobacco Use   Smoking status: Former    Current packs/day: 0.00    Average packs/day: 0.5 packs/day    Types: Cigarettes    Quit date: 11/20/2015    Years since quitting: 8.6   Smokeless tobacco: Never   Tobacco comments:    quit 2018  Vaping Use   Vaping status: Never Used  Substance Use Topics   Alcohol use: No   Drug use: No       OPHTHALMIC EXAM: Not recorded    IMAGING AND PROCEDURES  Imaging and Procedures for 08/11/2024           ASSESSMENT/PLAN:  No diagnosis found.  1-3. Moderate non-proliferative diabetic retinopathy, both eyes  - A1c was 6.7 (06.06.25), 6.5 (02.17.25) - s/p IVA OD #1 (11.14.22), #2 (12.12.22), #3 (01.09.23), #4 (06.23.25), #5 (08.04.25), #6(09.02.25), #7 (09.29.25), #8 (10.27.25), #9 (12.01.25), #10 (01.05.26) - s/p IVA OS #1 (10.17.22), #2 (11.14.22), #3 (12.12.22), #4 (01.09.23), #5 (03.15.23), #6 (04.12.23), #7 (08.04.25), #8 (09.02.25), #9 (09.29.25), #10 (10.27.25), #11 (12.01.25), #12 (01.05.26) ================== - s/p IVE OD #1 (03.15.23), #2 (04.12.23), #3 (05.10.23), #4 (06.07.23), #5 (07.05.23), #6 (08.02.23), #7 (08.31.23), #8 (09.28.23), #9 (10.30.23), #10 (11.28.23), #11 (12.28.23), #12 (01.29.24), #13 (02.20.24), #14 (03.25.24), #15 (04.22.24), #16 (05.20.24), #17 (06.17.24), #18 (07.22.24), #19 (08.26.24), #20 (09.30.24), #21 (11.04.24), #22 (12.16.24) - s/p IVE OS #1 (05.10.23), #2 (06.07.23), #3 (07.05.23), #4 (08.02.23), #5 (08.31.23), #6 (09.28.23), #7 (10.30.23), #8 (11.28.23), #9 (12.28.23), #10 (01.29.24), #11 (02.20.24), #12 (03.25.24), #13 (04.22.24), #14 (05.20.24), #15 (06.17.24), #16 (07.22.24), #17 (09.30.24), #18 (11.04.24), #19 (12.16.24) - s/p focal laser OS (06.25.24) -  s/p focal laser OD #1 (01.13.25), #2 (11.12.25) - exam shows scattered MA, DBH OU - FA (10.17.22) shows late leaking MA OU, no NV OU - repeat FA 10.27.25) shows OD: Scattered late leaking MA greatest inferior to fovea, No NV, OS: Scattered late leaking MA greatest ST to fovea, No NV - OCT shows OD: Persistent IRF/cystic changes inferior fovea and mac --slightly increased temp fovea; OS: Persistent cystic changes IN and temporal macula-- slightly increased IN fovea at 5 wks - BCVA OD 20/20 from 20/25 OS stable at 20/20 -  recommend IVA today OU (02.10.26) w/ f/u in 4-5 weeks - RBA of procedure discussed, questions answered - informed consent obtained and signed - see procedure note  - IVA informed consent obtained and signed, 06.23.25 - 4-5 weeks - DFE, OCT, likely IVA OU  4,5. Hypertensive retinopathy OU - discussed importance of tight BP control  - monitor  6. Pseudophakia OU  - s/p CE/IOL OU  - IOL in good position, doing well  - monitor  Ophthalmic Meds Ordered this visit:  No orders of the defined types were placed in this encounter.    No follow-ups on file.  There are no Patient Instructions on file for this visit.  Explained the diagnoses, plan, and follow up with the patient and they expressed understanding.  Patient expressed understanding of the importance of proper follow up care.   This document serves as a record of services personally performed by Redell JUDITHANN Hans, MD, PhD. It was created on their behalf by Wanda GEANNIE Keens, COT an ophthalmic technician. The creation of this record is the provider's dictation and/or activities during the visit.    Electronically signed by:  Wanda GEANNIE Keens, COT  07/28/24 9:42 AM    Redell JUDITHANN Hans, M.D., Ph.D. Diseases & Surgery of the Retina and Vitreous [facility] Abbreviations: M myopia (nearsighted); A astigmatism; H hyperopia (farsighted); P presbyopia; Mrx spectacle prescription;  CTL contact  lenses; OD right eye; OS left eye; OU both eyes  XT exotropia; ET esotropia; PEK punctate epithelial keratitis; PEE punctate epithelial erosions; DES dry eye syndrome; MGD meibomian gland dysfunction; ATs artificial tears; PFAT's preservative free artificial tears; NSC nuclear sclerotic cataract; PSC posterior subcapsular cataract; ERM epi-retinal membrane; PVD posterior vitreous detachment; RD retinal detachment; DM diabetes mellitus; DR diabetic retinopathy; NPDR non-proliferative diabetic retinopathy; PDR proliferative diabetic retinopathy; CSME clinically significant macular edema; DME diabetic macular edema; dbh dot blot hemorrhages; CWS cotton wool spot; POAG primary open angle glaucoma; C/D cup-to-disc ratio; HVF humphrey visual field; GVF goldmann visual field; OCT optical coherence tomography; IOP intraocular pressure; BRVO Branch retinal vein occlusion; CRVO central retinal vein occlusion; CRAO central retinal artery occlusion; BRAO branch retinal artery occlusion; RT retinal tear; SB scleral buckle; PPV pars plana vitrectomy; VH Vitreous hemorrhage; PRP panretinal laser photocoagulation; IVK intravitreal kenalog; VMT vitreomacular traction; MH Macular hole;  NVD neovascularization of the disc; NVE neovascularization elsewhere; AREDS age related eye disease study; ARMD age related macular degeneration; POAG primary open angle glaucoma; EBMD epithelial/anterior basement membrane dystrophy; ACIOL anterior chamber intraocular lens; IOL intraocular lens; PCIOL posterior chamber intraocular lens; Phaco/IOL phacoemulsification with intraocular lens placement; PRK photorefractive keratectomy; LASIK laser assisted in situ keratomileusis; HTN hypertension; DM diabetes mellitus; COPD chronic obstructive pulmonary disease "

## 2024-07-30 ENCOUNTER — Other Ambulatory Visit (HOSPITAL_COMMUNITY): Payer: Self-pay

## 2024-07-30 ENCOUNTER — Ambulatory Visit

## 2024-08-03 ENCOUNTER — Ambulatory Visit

## 2024-08-04 ENCOUNTER — Telehealth: Payer: Self-pay

## 2024-08-04 NOTE — Telephone Encounter (Signed)
 Copied from CRM [id number]. Topic: Referral - Status >> Aug 04, 2024  9:38 AM Mesmerise C wrote: Reason for CRM: Patient's daughter Rosaline checking status of referral stated her father is now in the hospital due to not hearing back in regards to the referral, she expressed anger and sadness in regards of feeling like no one cares for her father and that the provider doesn't listen or acknowledge his health issues, advised of referral was sent and provided information to call

## 2024-08-04 NOTE — Telephone Encounter (Signed)
 Patient daughter states that she has reached out to gastro and awaiting a call back so that they can get her dad scheduled

## 2024-08-06 ENCOUNTER — Ambulatory Visit

## 2024-08-06 DIAGNOSIS — I480 Paroxysmal atrial fibrillation: Secondary | ICD-10-CM

## 2024-08-06 DIAGNOSIS — Z7901 Long term (current) use of anticoagulants: Secondary | ICD-10-CM

## 2024-08-06 LAB — POCT INR: INR: 2.6 (ref 2.0–3.0)

## 2024-08-06 NOTE — Progress Notes (Signed)
 INR 2.6  Continue 1.5 tablets daily except for 1 tablet every Mondays and Fridays. Recheck INR in 5 weeks. Increase green intake to 3 times weekly. Anticoagulation Clinic [phone]

## 2024-08-06 NOTE — Patient Instructions (Signed)
 Continue 1.5 tablets daily except for 1 tablet every Mondays and Fridays. Recheck INR in 5 weeks. Increase green intake to 3 times weekly. Anticoagulation Clinic [phone]

## 2024-08-07 ENCOUNTER — Other Ambulatory Visit: Payer: Self-pay

## 2024-08-07 ENCOUNTER — Other Ambulatory Visit: Payer: Self-pay | Admitting: Internal Medicine

## 2024-08-07 ENCOUNTER — Other Ambulatory Visit (HOSPITAL_COMMUNITY): Payer: Self-pay

## 2024-08-07 DIAGNOSIS — G2581 Restless legs syndrome: Secondary | ICD-10-CM

## 2024-08-07 MED ORDER — OXYCODONE HCL 5 MG PO TABS
2.5000 mg | ORAL_TABLET | Freq: Four times a day (QID) | ORAL | 0 refills | Status: AC | PRN
  Filled 2024-08-07: qty 8, 2d supply, fill #0

## 2024-08-10 ENCOUNTER — Ambulatory Visit: Admitting: Internal Medicine

## 2024-08-11 ENCOUNTER — Encounter (INDEPENDENT_AMBULATORY_CARE_PROVIDER_SITE_OTHER): Admitting: Ophthalmology

## 2024-08-11 DIAGNOSIS — Z7985 Long-term (current) use of injectable non-insulin antidiabetic drugs: Secondary | ICD-10-CM

## 2024-08-11 DIAGNOSIS — E113313 Type 2 diabetes mellitus with moderate nonproliferative diabetic retinopathy with macular edema, bilateral: Secondary | ICD-10-CM

## 2024-08-11 DIAGNOSIS — Z794 Long term (current) use of insulin: Secondary | ICD-10-CM

## 2024-08-11 DIAGNOSIS — I10 Essential (primary) hypertension: Secondary | ICD-10-CM

## 2024-08-11 DIAGNOSIS — H35033 Hypertensive retinopathy, bilateral: Secondary | ICD-10-CM

## 2024-08-11 DIAGNOSIS — Z961 Presence of intraocular lens: Secondary | ICD-10-CM

## 2024-09-10 ENCOUNTER — Ambulatory Visit

## 2025-02-24 ENCOUNTER — Ambulatory Visit
